# Patient Record
Sex: Male | Born: 1990 | Race: Black or African American | Hispanic: No | Marital: Single | Smoking: Former smoker
Health system: Southern US, Community
[De-identification: ages and names within clinical notes are randomized; demographics above are authoritative.]

## PROBLEM LIST (undated history)

## (undated) ENCOUNTER — Telehealth

## (undated) ENCOUNTER — Ambulatory Visit

## (undated) ENCOUNTER — Encounter

## (undated) ENCOUNTER — Ambulatory Visit: Payer: MEDICAID

## (undated) ENCOUNTER — Ambulatory Visit: Payer: MEDICAID | Attending: Internal Medicine | Primary: Internal Medicine

## (undated) ENCOUNTER — Inpatient Hospital Stay

## (undated) ENCOUNTER — Ambulatory Visit: Payer: MEDICAID | Attending: Surgery | Primary: Surgery

## (undated) ENCOUNTER — Encounter: Attending: Surgery | Primary: Surgery

## (undated) ENCOUNTER — Encounter: Attending: Internal Medicine | Primary: Internal Medicine

## (undated) ENCOUNTER — Ambulatory Visit
Attending: Student in an Organized Health Care Education/Training Program | Primary: Student in an Organized Health Care Education/Training Program

## (undated) ENCOUNTER — Telehealth: Attending: Gastroenterology | Primary: Gastroenterology

## (undated) ENCOUNTER — Telehealth: Attending: Internal Medicine | Primary: Internal Medicine

## (undated) ENCOUNTER — Telehealth
Attending: Student in an Organized Health Care Education/Training Program | Primary: Student in an Organized Health Care Education/Training Program

## (undated) DIAGNOSIS — K754 Autoimmune hepatitis: Secondary | ICD-10-CM

## (undated) DIAGNOSIS — R112 Nausea with vomiting, unspecified: Secondary | ICD-10-CM

## (undated) DIAGNOSIS — F909 Attention-deficit hyperactivity disorder, unspecified type: Secondary | ICD-10-CM

## (undated) DIAGNOSIS — Z9889 Other specified postprocedural states: Secondary | ICD-10-CM

## (undated) DIAGNOSIS — H9190 Unspecified hearing loss, unspecified ear: Secondary | ICD-10-CM

## (undated) DIAGNOSIS — F32A Depression, unspecified: Secondary | ICD-10-CM

## (undated) DIAGNOSIS — A0472 Enterocolitis due to Clostridium difficile, not specified as recurrent: Secondary | ICD-10-CM

## (undated) DIAGNOSIS — F319 Bipolar disorder, unspecified: Secondary | ICD-10-CM

## (undated) DIAGNOSIS — I10 Essential (primary) hypertension: Secondary | ICD-10-CM

## (undated) DIAGNOSIS — F329 Major depressive disorder, single episode, unspecified: Secondary | ICD-10-CM

## (undated) DIAGNOSIS — J189 Pneumonia, unspecified organism: Secondary | ICD-10-CM

## (undated) DIAGNOSIS — R569 Unspecified convulsions: Secondary | ICD-10-CM

## (undated) DIAGNOSIS — F419 Anxiety disorder, unspecified: Secondary | ICD-10-CM

## (undated) DIAGNOSIS — R7303 Prediabetes: Secondary | ICD-10-CM

## (undated) DIAGNOSIS — R519 Headache, unspecified: Secondary | ICD-10-CM

## (undated) DIAGNOSIS — F819 Developmental disorder of scholastic skills, unspecified: Secondary | ICD-10-CM

## (undated) DIAGNOSIS — D649 Anemia, unspecified: Secondary | ICD-10-CM

## (undated) DIAGNOSIS — K5190 Ulcerative colitis, unspecified, without complications: Secondary | ICD-10-CM

## (undated) DIAGNOSIS — K219 Gastro-esophageal reflux disease without esophagitis: Secondary | ICD-10-CM

## (undated) DIAGNOSIS — Z8489 Family history of other specified conditions: Secondary | ICD-10-CM

## (undated) DIAGNOSIS — T8859XA Other complications of anesthesia, initial encounter: Secondary | ICD-10-CM

## (undated) HISTORY — PX: TONSILLECTOMY: SUR1361

## (undated) HISTORY — DX: Enterocolitis due to Clostridium difficile, not specified as recurrent: A04.72

## (undated) HISTORY — DX: Ulcerative colitis, unspecified, without complications: K51.90

## (undated) HISTORY — PX: ADENOIDECTOMY: SUR15

## (undated) HISTORY — DX: Autoimmune hepatitis: K75.4

## (undated) HISTORY — DX: Developmental disorder of scholastic skills, unspecified: F81.9

## (undated) HISTORY — PX: COLOPROCTECTOMY W/ ILEO J POUCH: SUR277

## (undated) HISTORY — PX: COLON SURGERY: SHX602

---
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
## 2012-01-21 ENCOUNTER — Emergency Department (HOSPITAL_COMMUNITY)
Admission: EM | Admit: 2012-01-21 | Discharge: 2012-01-21 | Disposition: A | Discharge status: Home / Self Care | Payer: Medicaid Other | Attending: Emergency Medicine | Admitting: Emergency Medicine

## 2012-01-21 ENCOUNTER — Encounter (HOSPITAL_COMMUNITY): Payer: Self-pay | Admitting: *Deleted

## 2012-01-21 DIAGNOSIS — Z881 Allergy status to other antibiotic agents status: Secondary | ICD-10-CM | POA: Insufficient documentation

## 2012-01-21 DIAGNOSIS — K5289 Other specified noninfective gastroenteritis and colitis: Secondary | ICD-10-CM | POA: Insufficient documentation

## 2012-01-21 DIAGNOSIS — K920 Hematemesis: Secondary | ICD-10-CM | POA: Insufficient documentation

## 2012-01-21 DIAGNOSIS — K529 Noninfective gastroenteritis and colitis, unspecified: Secondary | ICD-10-CM

## 2012-01-21 DIAGNOSIS — K219 Gastro-esophageal reflux disease without esophagitis: Secondary | ICD-10-CM | POA: Insufficient documentation

## 2012-01-21 DIAGNOSIS — J45909 Unspecified asthma, uncomplicated: Secondary | ICD-10-CM | POA: Insufficient documentation

## 2012-01-21 DIAGNOSIS — Z825 Family history of asthma and other chronic lower respiratory diseases: Secondary | ICD-10-CM | POA: Insufficient documentation

## 2012-01-21 DIAGNOSIS — F319 Bipolar disorder, unspecified: Secondary | ICD-10-CM | POA: Insufficient documentation

## 2012-01-21 DIAGNOSIS — Z91018 Allergy to other foods: Secondary | ICD-10-CM | POA: Insufficient documentation

## 2012-01-21 DIAGNOSIS — F909 Attention-deficit hyperactivity disorder, unspecified type: Secondary | ICD-10-CM | POA: Insufficient documentation

## 2012-01-21 DIAGNOSIS — F17200 Nicotine dependence, unspecified, uncomplicated: Secondary | ICD-10-CM | POA: Insufficient documentation

## 2012-01-21 LAB — COMPREHENSIVE METABOLIC PANEL
ALT: 12 U/L (ref 0–53)
AST: 30 U/L (ref 0–37)
Albumin: 4.5 g/dL (ref 3.5–5.2)
Alkaline Phosphatase: 72 U/L (ref 39–117)
BUN: 11 mg/dL (ref 6–23)
CO2: 28 mEq/L (ref 19–32)
Calcium: 10.2 mg/dL (ref 8.4–10.5)
Chloride: 100 mEq/L (ref 96–112)
Creatinine, Ser: 1.05 mg/dL (ref 0.50–1.35)
GFR calc Af Amer: >90 mL/min (ref >90)
GFR calc non Af Amer: >90 mL/min (ref >90)
Glucose, Bld: 93 mg/dL (ref 70–99)
Potassium: 4 mEq/L (ref 3.5–5.1)
Sodium: 137 mEq/L (ref 135–145)
Total Bilirubin: 0.6 mg/dL (ref 0.3–1.2)
Total Protein: 8.2 g/dL (ref 6.0–8.3)

## 2012-01-21 LAB — CBC WITH DIFFERENTIAL/PLATELET
Basophils Absolute: 0 10*3/uL (ref 0.0–0.1)
Basophils Relative: 0 % (ref 0–1)
Eosinophils Absolute: 0.1 10*3/uL (ref 0.0–0.7)
Eosinophils Relative: 1 % (ref 0–5)
HCT: 43.7 % (ref 39.0–52.0)
Hemoglobin: 15.5 g/dL (ref 13.0–17.0)
Lymphocytes Relative: 25 % (ref 12–46)
Lymphs Abs: 2 10*3/uL (ref 0.7–4.0)
MCH: 28.4 pg (ref 26.0–34.0)
MCHC: 35.5 g/dL (ref 30.0–36.0)
MCV: 80.2 fL (ref 78.0–100.0)
Monocytes Absolute: 0.5 10*3/uL (ref 0.1–1.0)
Monocytes Relative: 6 % (ref 3–12)
Neutro Abs: 5.3 10*3/uL (ref 1.7–7.7)
Neutrophils Relative %: 68 % (ref 43–77)
Platelets: 252 10*3/uL (ref 150–400)
RBC: 5.45 MIL/uL (ref 4.22–5.81)
RDW: 12.9 % (ref 11.5–15.5)
WBC: 7.8 10*3/uL (ref 4.0–10.5)

## 2012-01-21 MED ORDER — KETOROLAC TROMETHAMINE 30 MG/ML IJ SOLN
30.0000 mg | Freq: Once | INTRAMUSCULAR | Status: AC
  Administered 2012-01-21: 30 mg via INTRAVENOUS
  Filled 2012-01-21: qty 1

## 2012-01-21 MED ORDER — PROMETHAZINE HCL 25 MG PO TABS: 25.0000 mg | ORAL_TABLET | Freq: Four times a day (QID) | ORAL | Status: DC | PRN

## 2012-01-21 MED ORDER — SODIUM CHLORIDE 0.9 % IV SOLN
Freq: Once | INTRAVENOUS | Status: AC
  Administered 2012-01-21: 20:00:00 via INTRAVENOUS

## 2012-01-21 MED ORDER — ONDANSETRON HCL 4 MG/2ML IJ SOLN
4.0000 mg | Freq: Once | INTRAMUSCULAR | Status: AC
  Administered 2012-01-21: 4 mg via INTRAVENOUS
  Filled 2012-01-21: qty 2

## 2012-01-21 NOTE — ED Provider Notes (Addendum)
History     CSN: [id number]  Arrival date & time 01/21/12  1659   First MD Initiated Contact with Patient 01/21/12 1925      Chief Complaint  Patient presents with  . Hematemesis    (Consider location/radiation/quality/duration/timing/severity/associated sxs/prior treatment) HPI Comments: No ill contacts or suspicious foods.  Patient is a 21 y.o. male presenting with vomiting. The history is provided by the patient.  Emesis  This is a new problem. Episode onset: 2-3 days ago. The problem occurs continuously. The problem has not changed since onset.The emesis has an appearance of stomach contents and bright red blood. There has been no fever. Pertinent negatives include no abdominal pain, no chills, no diarrhea and no fever.    Past Medical History  Diagnosis Date  . Asthma   . ADHD (attention deficit hyperactivity disorder)   . Bipolar 1 disorder   . GERD (gastroesophageal reflux disease)   . Depression     Past Surgical History  Procedure Date  . Tonsillectomy   . Adenoidectomy     Family History  Problem Relation Age of Onset  . Asthma Mother     History  Substance Use Topics  . Smoking status: Current Everyday Smoker    Types: Cigarettes  . Smokeless tobacco: Not on file  . Alcohol Use: No      Review of Systems  Constitutional: Negative for fever and chills.  Gastrointestinal: Positive for vomiting. Negative for abdominal pain and diarrhea.  All other systems reviewed and are negative.    Allergies  Amoxicillin-pot clavulanate; Pineapple; and Tomato  Home Medications   Current Outpatient Rx  Name Route Sig Dispense Refill  . ALBUTEROL SULFATE HFA 108 (90 BASE) MCG/ACT IN AERS Inhalation Inhale 2 puffs into the lungs every 6 (six) hours as needed. For shortness of breath     . ALBUTEROL SULFATE (2.5 MG/3ML) 0.083% IN NEBU Nebulization Take 2.5 mg by nebulization every 6 (six) hours as needed. For shortness of breath     . ATOMOXETINE HCL 100 MG  PO CAPS Oral Take 100 mg by mouth every morning.     Marland Kitchen DIPHENHYDRAMINE HCL 50 MG PO CAPS Oral Take 50 mg by mouth as needed. For allergic reaction     . EPINEPHRINE 0.3 MG/0.3ML IJ DEVI Intramuscular Inject 0.3 mg into the muscle once.      Marland Kitchen GUANFACINE HCL ER 4 MG PO TB24 Oral Take 4 mg by mouth at bedtime.      Marland Kitchen LORATADINE 10 MG PO TABS Oral Take 10 mg by mouth at bedtime.      . METHYLPHENIDATE HCL ER 54 MG PO TBCR Oral Take 54 mg by mouth every morning.    Marland Kitchen MONTELUKAST SODIUM 10 MG PO TABS Oral Take 10 mg by mouth at bedtime.      Marland Kitchen OLANZAPINE 15 MG PO TABS Oral Take 15 mg by mouth at bedtime.      . OMEPRAZOLE 40 MG PO CPDR Oral Take 40 mg by mouth every morning.       BP 130/74  Pulse 89  Temp 98.5 F (36.9 C) (Oral)  Resp 20  Ht 5' 9"  (1.753 m)  Wt 184 lb (83.462 kg)  BMI 27.17 kg/m2  SpO2 100%  Physical Exam  Nursing note and vitals reviewed. Constitutional: He is oriented to person, place, and time. He appears well-developed and well-nourished. No distress.  HENT:  Head: Normocephalic and atraumatic.  Neck: Normal range of motion. Neck supple.  Cardiovascular:  Normal rate and regular rhythm.   No murmur heard. Pulmonary/Chest: Effort normal and breath sounds normal. No respiratory distress. He has no wheezes.  Abdominal: Soft. He exhibits no distension. There is no tenderness.  Musculoskeletal: Normal range of motion.  Neurological: He is alert and oriented to person, place, and time.  Skin: Skin is warm and dry. He is not diaphoretic.    ED Course  Procedures (including critical care time)   Labs Reviewed  CBC WITH DIFFERENTIAL  COMPREHENSIVE METABOLIC PANEL   No results found.   No diagnosis found.    MDM  The patient presents with vomiting for the past two days, the most recent episode was streaked with blood.  Today's workup reveal a normal Hb, WBC and electrolytes.  He was hydrated, given antiemetics, and has not vomited since he arrived here.  I  suspect he has enteritis with Mallory-Weiss tear.  Does not appear toxic, doubt Boerhaave's.  Will discharge to home with antiemetics, follow up prn if he worsens or symptoms persist.  He is already on prilosec.  Will recommend he take this twice daily until he improves.          Veryl Speak, MD 01/21/12 2054  Veryl Speak, MD 03/04/12 0700

## 2012-01-21 NOTE — ED Notes (Signed)
Vomiting for last few days, now vomited blood x1.  Alert, NAD. No abd pain

## 2012-05-08 ENCOUNTER — Emergency Department (HOSPITAL_COMMUNITY): Payer: Medicaid Other

## 2012-05-08 ENCOUNTER — Emergency Department (HOSPITAL_COMMUNITY)
Admission: EM | Admit: 2012-05-08 | Discharge: 2012-05-08 | Disposition: A | Discharge status: Home / Self Care | Payer: Medicaid Other | Attending: Emergency Medicine | Admitting: Emergency Medicine

## 2012-05-08 ENCOUNTER — Encounter (HOSPITAL_COMMUNITY): Payer: Self-pay

## 2012-05-08 DIAGNOSIS — Z79899 Other long term (current) drug therapy: Secondary | ICD-10-CM | POA: Insufficient documentation

## 2012-05-08 DIAGNOSIS — F3289 Other specified depressive episodes: Secondary | ICD-10-CM | POA: Insufficient documentation

## 2012-05-08 DIAGNOSIS — F329 Major depressive disorder, single episode, unspecified: Secondary | ICD-10-CM | POA: Insufficient documentation

## 2012-05-08 DIAGNOSIS — K219 Gastro-esophageal reflux disease without esophagitis: Secondary | ICD-10-CM | POA: Insufficient documentation

## 2012-05-08 DIAGNOSIS — F909 Attention-deficit hyperactivity disorder, unspecified type: Secondary | ICD-10-CM | POA: Insufficient documentation

## 2012-05-08 DIAGNOSIS — Y9389 Activity, other specified: Secondary | ICD-10-CM | POA: Insufficient documentation

## 2012-05-08 DIAGNOSIS — X58XXXA Exposure to other specified factors, initial encounter: Secondary | ICD-10-CM | POA: Insufficient documentation

## 2012-05-08 DIAGNOSIS — J45909 Unspecified asthma, uncomplicated: Secondary | ICD-10-CM | POA: Insufficient documentation

## 2012-05-08 DIAGNOSIS — F319 Bipolar disorder, unspecified: Secondary | ICD-10-CM | POA: Insufficient documentation

## 2012-05-08 DIAGNOSIS — S40012A Contusion of left shoulder, initial encounter: Secondary | ICD-10-CM

## 2012-05-08 DIAGNOSIS — Y9289 Other specified places as the place of occurrence of the external cause: Secondary | ICD-10-CM | POA: Insufficient documentation

## 2012-05-08 DIAGNOSIS — F17200 Nicotine dependence, unspecified, uncomplicated: Secondary | ICD-10-CM | POA: Insufficient documentation

## 2012-05-08 DIAGNOSIS — S40019A Contusion of unspecified shoulder, initial encounter: Secondary | ICD-10-CM | POA: Insufficient documentation

## 2012-05-08 DIAGNOSIS — IMO0001 Reserved for inherently not codable concepts without codable children: Secondary | ICD-10-CM | POA: Insufficient documentation

## 2012-05-08 MED ORDER — IBUPROFEN 800 MG PO TABS
800.0000 mg | ORAL_TABLET | Freq: Once | ORAL | Status: AC
  Administered 2012-05-08: 800 mg via ORAL
  Filled 2012-05-08: qty 1

## 2012-05-08 MED ORDER — IBUPROFEN 800 MG PO TABS: 800.0000 mg | ORAL_TABLET | Freq: Three times a day (TID) | ORAL | Status: DC

## 2012-05-08 NOTE — ED Notes (Signed)
Pt presents with left shoulder pain secondary to attempting to force a lock door open per pt's family. No deformity noted at this time. Pulses equal bilateral extremities with brisk cap refill. Pt denies other injuries.

## 2012-05-08 NOTE — ED Provider Notes (Signed)
History     CSN: [id number]  Arrival date & time 05/08/12  1651   First MD Initiated Contact with Patient 05/08/12 1927      Chief Complaint  Patient presents with  . Shoulder Pain    (Consider location/radiation/quality/duration/timing/severity/associated sxs/prior treatment) Patient is a 21 y.o. male presenting with shoulder pain. The history is provided by the patient.  Shoulder Pain This is a new problem. The current episode started yesterday. The problem occurs constantly. The problem has been unchanged. Associated symptoms include myalgias. Pertinent negatives include no abdominal pain, arthralgias, chest pain, coughing, fever, headaches or neck pain. Exacerbated by: raising the left arm. He has tried acetaminophen for the symptoms. The treatment provided no relief.    Past Medical History  Diagnosis Date  . Asthma   . ADHD (attention deficit hyperactivity disorder)   . Bipolar 1 disorder   . GERD (gastroesophageal reflux disease)   . Depression     Past Surgical History  Procedure Date  . Tonsillectomy   . Adenoidectomy     Family History  Problem Relation Age of Onset  . Asthma Mother     History  Substance Use Topics  . Smoking status: Current Every Day Smoker    Types: Cigarettes  . Smokeless tobacco: Not on file  . Alcohol Use: No      Review of Systems  Constitutional: Negative for fever and activity change.       All ROS Neg except as noted in HPI  HENT: Negative for nosebleeds and neck pain.   Eyes: Negative for photophobia and discharge.  Respiratory: Negative for cough, shortness of breath and wheezing.   Cardiovascular: Negative for chest pain and palpitations.  Gastrointestinal: Negative for abdominal pain and blood in stool.  Genitourinary: Negative for dysuria, frequency and hematuria.  Musculoskeletal: Positive for myalgias. Negative for back pain and arthralgias.  Skin: Negative.   Neurological: Negative for dizziness, seizures,  speech difficulty and headaches.  Psychiatric/Behavioral: Negative for hallucinations and confusion.    Allergies  Amoxicillin-pot clavulanate; Pineapple; Strawberry; and Tomato  Home Medications   Current Outpatient Rx  Name  Route  Sig  Dispense  Refill  . ALBUTEROL SULFATE HFA 108 (90 BASE) MCG/ACT IN AERS   Inhalation   Inhale 2 puffs into the lungs every 6 (six) hours as needed. For shortness of breath          . ALBUTEROL SULFATE (2.5 MG/3ML) 0.083% IN NEBU   Nebulization   Take 2.5 mg by nebulization every 6 (six) hours as needed. For shortness of breath         . ATOMOXETINE HCL 100 MG PO CAPS   Oral   Take 100 mg by mouth every morning.          Marland Kitchen CLONIDINE HCL 0.1 MG PO TABS   Oral   Take 0.2 mg by mouth daily.         Marland Kitchen LORATADINE 10 MG PO TABS   Oral   Take 10 mg by mouth at bedtime.           . METHYLPHENIDATE HCL ER 54 MG PO TBCR   Oral   Take 54 mg by mouth every morning.         Marland Kitchen MONTELUKAST SODIUM 10 MG PO TABS   Oral   Take 10 mg by mouth at bedtime.           . OMEPRAZOLE 40 MG PO CPDR   Oral   Take  40 mg by mouth every morning.          Marland Kitchen SERTRALINE HCL 25 MG PO TABS   Oral   Take 25 mg by mouth daily.         Marland Kitchen EPINEPHRINE 0.3 MG/0.3ML IJ DEVI   Intramuscular   Inject 0.3 mg into the muscle once.           Marland Kitchen PROMETHAZINE HCL 25 MG PO TABS   Oral   Take 1 tablet (25 mg total) by mouth every 6 (six) hours as needed for nausea.   10 tablet   0     BP 138/82  Pulse 74  Temp 98.6 F (37 C) (Oral)  Resp 20  Ht 5' 8"  (1.727 m)  Wt 195 lb (88.451 kg)  BMI 29.65 kg/m2  SpO2 100%  Physical Exam  Nursing note and vitals reviewed. Constitutional: He is oriented to person, place, and time. He appears well-developed and well-nourished.  Non-toxic appearance.  HENT:  Head: Normocephalic.  Right Ear: Tympanic membrane and external ear normal.  Left Ear: Tympanic membrane and external ear normal.  Eyes: EOM and lids  are normal. Pupils are equal, round, and reactive to light.  Neck: Normal range of motion. Neck supple. Carotid bruit is not present.  Cardiovascular: Normal rate, regular rhythm, normal heart sounds, intact distal pulses and normal pulses.   Pulmonary/Chest: Breath sounds normal. No respiratory distress.  Abdominal: Soft. Bowel sounds are normal. There is no tenderness. There is no guarding.  Musculoskeletal: Normal range of motion.       There is full range of motion of the fingers, wrists, and elbow, of the left upper extremity. The brachial and radial pulses are 2+ and symmetrical. The capillary refill is less than 3 seconds. There is soreness and discomfort with attempted range of motion of the left shoulder. There is no deformity appreciated. There is no hot areas or hot joints involving the left shoulder. The clavicle is nontender, and there's no deformity. There's no deformity of the scapula on the left.  Lymphadenopathy:       Head (right side): No submandibular adenopathy present.       Head (left side): No submandibular adenopathy present.    He has no cervical adenopathy.  Neurological: He is alert and oriented to person, place, and time. He has normal strength. No cranial nerve deficit or sensory deficit.  Skin: Skin is warm and dry.  Psychiatric: He has a normal mood and affect. His speech is normal.    ED Course  Procedures (including critical care time)  Labs Reviewed - No data to display Dg Shoulder Left  05/08/2012  *RADIOLOGY REPORT*  Clinical Data: Left shoulder pain after injury.  LEFT SHOULDER - 2+ VIEW  Comparison:  None.  Findings:  There is no evidence of fracture or dislocation.  There is no evidence of arthropathy or other focal bone abnormality. Soft tissues are unremarkable.  IMPRESSION: Negative.   Original Report Authenticated By: Aletta Edouard, M.D.      No diagnosis found.    MDM  I have reviewed nursing notes, vital signs, and all appropriate lab and  imaging results for this patient. X-ray of the left shoulder is negative for fracture or dislocation. The patient is fitted with a sling to be used over the next 5-7 days. Prescription for ibuprofen 800 mg 3 times daily with food also given to the patient. The patient is to see the orthopedist for additional evaluation if not  improving.       Lenox Ahr, [place] 05/08/12 [phone]

## 2012-05-08 NOTE — ED Notes (Signed)
Pt reports ramming door of home w/ left shoulder. Yesterday. Area painful today

## 2012-05-08 NOTE — ED Provider Notes (Signed)
Medical screening examination/treatment/procedure(s) were performed by non-physician practitioner and as supervising physician I was immediately available for consultation/collaboration.  Rhunette Croft, M.D.     Rhunette Croft, MD 05/08/12 2103

## 2012-08-11 ENCOUNTER — Encounter (HOSPITAL_COMMUNITY): Payer: Self-pay

## 2012-08-11 ENCOUNTER — Emergency Department (HOSPITAL_COMMUNITY)
Admission: EM | Admit: 2012-08-11 | Discharge: 2012-08-11 | Disposition: A | Discharge status: Home / Self Care | Payer: Medicaid Other | Attending: Emergency Medicine | Admitting: Emergency Medicine

## 2012-08-11 ENCOUNTER — Emergency Department (HOSPITAL_COMMUNITY): Payer: Medicaid Other

## 2012-08-11 DIAGNOSIS — J45909 Unspecified asthma, uncomplicated: Secondary | ICD-10-CM | POA: Insufficient documentation

## 2012-08-11 DIAGNOSIS — F909 Attention-deficit hyperactivity disorder, unspecified type: Secondary | ICD-10-CM | POA: Insufficient documentation

## 2012-08-11 DIAGNOSIS — F319 Bipolar disorder, unspecified: Secondary | ICD-10-CM | POA: Insufficient documentation

## 2012-08-11 DIAGNOSIS — Z79899 Other long term (current) drug therapy: Secondary | ICD-10-CM | POA: Insufficient documentation

## 2012-08-11 DIAGNOSIS — R109 Unspecified abdominal pain: Secondary | ICD-10-CM

## 2012-08-11 DIAGNOSIS — Z046 Encounter for general psychiatric examination, requested by authority: Secondary | ICD-10-CM | POA: Insufficient documentation

## 2012-08-11 DIAGNOSIS — F419 Anxiety disorder, unspecified: Secondary | ICD-10-CM

## 2012-08-11 DIAGNOSIS — R1084 Generalized abdominal pain: Secondary | ICD-10-CM | POA: Insufficient documentation

## 2012-08-11 DIAGNOSIS — K219 Gastro-esophageal reflux disease without esophagitis: Secondary | ICD-10-CM | POA: Insufficient documentation

## 2012-08-11 DIAGNOSIS — F411 Generalized anxiety disorder: Secondary | ICD-10-CM | POA: Insufficient documentation

## 2012-08-11 DIAGNOSIS — R112 Nausea with vomiting, unspecified: Secondary | ICD-10-CM | POA: Insufficient documentation

## 2012-08-11 LAB — COMPREHENSIVE METABOLIC PANEL
ALT: 9 U/L (ref 0–53)
AST: 25 U/L (ref 0–37)
Albumin: 3.9 g/dL (ref 3.5–5.2)
Alkaline Phosphatase: 62 U/L (ref 39–117)
BUN: 9 mg/dL (ref 6–23)
CO2: 26 mEq/L (ref 19–32)
Calcium: 9.8 mg/dL (ref 8.4–10.5)
Chloride: 104 mEq/L (ref 96–112)
Creatinine, Ser: 0.96 mg/dL (ref 0.50–1.35)
GFR calc Af Amer: >90 mL/min (ref >90)
GFR calc non Af Amer: >90 mL/min (ref >90)
Glucose, Bld: 103 mg/dL — ABNORMAL HIGH (ref 70–99)
Potassium: 3.8 mEq/L (ref 3.5–5.1)
Sodium: 140 mEq/L (ref 135–145)
Total Bilirubin: 0.3 mg/dL (ref 0.3–1.2)
Total Protein: 6.9 g/dL (ref 6.0–8.3)

## 2012-08-11 LAB — CBC WITH DIFFERENTIAL/PLATELET
Basophils Absolute: 0 10*3/uL (ref 0.0–0.1)
Basophils Relative: 0 % (ref 0–1)
Eosinophils Absolute: 0.1 10*3/uL (ref 0.0–0.7)
Eosinophils Relative: 1 % (ref 0–5)
HCT: 40.4 % (ref 39.0–52.0)
Hemoglobin: 14.3 g/dL (ref 13.0–17.0)
Lymphocytes Relative: 24 % (ref 12–46)
Lymphs Abs: 1.5 10*3/uL (ref 0.7–4.0)
MCH: 28.5 pg (ref 26.0–34.0)
MCHC: 35.4 g/dL (ref 30.0–36.0)
MCV: 80.6 fL (ref 78.0–100.0)
Monocytes Absolute: 0.4 10*3/uL (ref 0.1–1.0)
Monocytes Relative: 6 % (ref 3–12)
Neutro Abs: 4.2 10*3/uL (ref 1.7–7.7)
Neutrophils Relative %: 69 % (ref 43–77)
Platelets: 228 10*3/uL (ref 150–400)
RBC: 5.01 MIL/uL (ref 4.22–5.81)
RDW: 13.1 % (ref 11.5–15.5)
WBC: 6.1 10*3/uL (ref 4.0–10.5)

## 2012-08-11 LAB — LIPASE, BLOOD: Lipase: 23 U/L (ref 11–59)

## 2012-08-11 MED ORDER — PANTOPRAZOLE SODIUM 40 MG IV SOLR
40.0000 mg | Freq: Once | INTRAVENOUS | Status: AC
  Administered 2012-08-11: 40 mg via INTRAVENOUS
  Filled 2012-08-11: qty 40

## 2012-08-11 MED ORDER — ONDANSETRON HCL 4 MG/2ML IJ SOLN
4.0000 mg | Freq: Once | INTRAMUSCULAR | Status: AC
Start: 2012-08-11 — End: 2012-08-11
  Administered 2012-08-11: 4 mg via INTRAVENOUS
  Filled 2012-08-11: qty 2

## 2012-08-11 MED ORDER — SODIUM CHLORIDE 0.9 % IV BOLUS (SEPSIS)
1000.0000 mL | Freq: Once | INTRAVENOUS | Status: AC
  Administered 2012-08-11: 1000 mL via INTRAVENOUS

## 2012-08-11 MED ORDER — TRAMADOL HCL 50 MG PO TABS: 50.0000 mg | ORAL_TABLET | Freq: Four times a day (QID) | ORAL | Status: DC | PRN
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
## 2012-08-11 NOTE — ED Notes (Signed)
Patient with c/o abdominal pain. Holding stomach and rocking back and forth. Patient denies SI/HI. H/o bipolar.
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
## 2012-08-11 NOTE — ED Notes (Signed)
Patient with no complaints at this time. Respirations even and unlabored. Skin warm/dry. Discharge instructions reviewed with patient at this time. Patient given opportunity to voice concerns/ask questions. IV removed per policy and band-aid applied to site. Patient discharged at this time and left Emergency Department with steady gait.  

## 2012-09-09 ENCOUNTER — Emergency Department (HOSPITAL_COMMUNITY)
Admission: EM | Admit: 2012-09-09 | Discharge: 2012-09-09 | Disposition: A | Discharge status: Home / Self Care | Payer: Medicaid Other | Attending: Emergency Medicine | Admitting: Emergency Medicine

## 2012-09-09 ENCOUNTER — Encounter (HOSPITAL_COMMUNITY): Payer: Self-pay | Admitting: *Deleted

## 2012-09-09 DIAGNOSIS — M79609 Pain in unspecified limb: Secondary | ICD-10-CM | POA: Insufficient documentation

## 2012-09-09 DIAGNOSIS — J45909 Unspecified asthma, uncomplicated: Secondary | ICD-10-CM | POA: Insufficient documentation

## 2012-09-09 DIAGNOSIS — F319 Bipolar disorder, unspecified: Secondary | ICD-10-CM | POA: Insufficient documentation

## 2012-09-09 DIAGNOSIS — K219 Gastro-esophageal reflux disease without esophagitis: Secondary | ICD-10-CM | POA: Insufficient documentation

## 2012-09-09 DIAGNOSIS — Z79899 Other long term (current) drug therapy: Secondary | ICD-10-CM | POA: Insufficient documentation

## 2012-09-09 DIAGNOSIS — F909 Attention-deficit hyperactivity disorder, unspecified type: Secondary | ICD-10-CM | POA: Insufficient documentation

## 2012-09-09 MED ORDER — ENOXAPARIN SODIUM 100 MG/ML ~~LOC~~ SOLN
1.0000 mg/kg | Freq: Once | SUBCUTANEOUS | Status: AC
  Administered 2012-09-09: 80 mg via SUBCUTANEOUS
  Filled 2012-09-09: qty 1

## 2012-09-09 MED ORDER — HYDROCODONE-ACETAMINOPHEN 5-325 MG PO TABS: ORAL_TABLET | ORAL | Status: DC

## 2012-09-09 NOTE — ED Notes (Signed)
Pain rt calf for 3 days, no known injury.

## 2012-09-10 ENCOUNTER — Ambulatory Visit (HOSPITAL_COMMUNITY)
Admit: 2012-09-10 | Discharge: 2012-09-10 | Disposition: A | Discharge status: Home / Self Care | Payer: Medicaid Other | Attending: Pediatrics | Admitting: Pediatrics

## 2012-09-11 NOTE — ED Provider Notes (Signed)
History     CSN: [id number]  Arrival date & time 09/09/12  1637   First MD Initiated Contact with Patient 09/09/12 1713      Chief Complaint  Patient presents with  . Leg Pain    (Consider location/radiation/quality/duration/timing/severity/associated sxs/prior treatment) HPI Comments: Patient c/o pain to his posterior right calf for 3 days.  He denies known injury, states the pain is worse with weight bearing and improves with rest.  Pain is also reproduced with dorsiflexion of the right foot.  He denies recent illness, joint pain or hx of previous DVT or recent extended travel.    Patient is a 22 y.o. male presenting with leg pain. The history is provided by the patient and a parent.  Leg Pain Location:  Leg Time since incident:  3 days Leg location:  R lower leg Pain details:    Quality:  Aching   Radiates to:  Does not radiate   Severity:  Moderate   Onset quality:  Gradual   Timing:  Constant   Progression:  Unchanged Chronicity:  New Dislocation: no   Foreign body present:  No foreign bodies Prior injury to area:  No Relieved by:  Rest Worsened by:  Bearing weight Ineffective treatments:  None tried Associated symptoms: no back pain, no decreased ROM, no fatigue, no fever, no muscle weakness, no neck pain, no numbness, no stiffness, no swelling and no tingling   Risk factors: no obesity     Past Medical History  Diagnosis Date  . Asthma   . ADHD (attention deficit hyperactivity disorder)   . Bipolar 1 disorder   . GERD (gastroesophageal reflux disease)   . Depression     Past Surgical History  Procedure Laterality Date  . Tonsillectomy    . Adenoidectomy      Family History  Problem Relation Age of Onset  . Asthma Mother     History  Substance Use Topics  . Smoking status: Never Smoker   . Smokeless tobacco: Never Used  . Alcohol Use: No      Review of Systems  Constitutional: Negative for fever, chills and fatigue.  HENT: Negative for sore  throat, trouble swallowing, neck pain and neck stiffness.   Respiratory: Negative for cough, shortness of breath and wheezing.   Cardiovascular: Negative for chest pain and palpitations.  Gastrointestinal: Negative for nausea, vomiting, abdominal pain and blood in stool.  Genitourinary: Negative for dysuria, hematuria and flank pain.  Musculoskeletal: Positive for myalgias. Negative for back pain, arthralgias and stiffness.  Skin: Negative for color change and rash.  Neurological: Negative for dizziness, weakness and numbness.  Hematological: Does not bruise/bleed easily.  All other systems reviewed and are negative.    Allergies  Amoxicillin-pot clavulanate; Pineapple; Strawberry; and Tomato  Home Medications   Current Outpatient Rx  Name  Route  Sig  Dispense  Refill  . atomoxetine (STRATTERA) 100 MG capsule   Oral   Take 100 mg by mouth every morning.          . cloNIDine (CATAPRES) 0.2 MG tablet   Oral   Take 0.2 mg by mouth at bedtime.         Marland Kitchen loratadine (CLARITIN) 10 MG tablet   Oral   Take 10 mg by mouth at bedtime.           . methylphenidate (CONCERTA) 54 MG CR tablet   Oral   Take 54 mg by mouth every morning.         Marland Kitchen  montelukast (SINGULAIR) 10 MG tablet   Oral   Take 10 mg by mouth at bedtime.           Marland Kitchen omeprazole (PRILOSEC) 40 MG capsule   Oral   Take 40 mg by mouth every morning.          . sertraline (ZOLOFT) 25 MG tablet   Oral   Take 50 mg by mouth daily.          Marland Kitchen albuterol (PROVENTIL HFA;VENTOLIN HFA) 108 (90 BASE) MCG/ACT inhaler   Inhalation   Inhale 2 puffs into the lungs every 6 (six) hours as needed. For shortness of breath          . albuterol (PROVENTIL) (2.5 MG/3ML) 0.083% nebulizer solution   Nebulization   Take 2.5 mg by nebulization every 6 (six) hours as needed. For shortness of breath         . EPINEPHrine (EPIPEN) 0.3 mg/0.3 mL DEVI   Intramuscular   Inject 0.3 mg into the muscle once.           Marland Kitchen  HYDROcodone-acetaminophen (NORCO/VICODIN) 5-325 MG per tablet      Take one tab po q 4-6 hrs prn pain   12 tablet   0     BP 122/74  Pulse 95  Temp(Src) 98.4 F (36.9 C) (Oral)  Resp 18  Ht 6' (1.829 m)  Wt 178 lb (80.74 kg)  BMI 24.14 kg/m2  SpO2 99%  Physical Exam  Nursing note and vitals reviewed. Constitutional: He is oriented to person, place, and time. He appears well-developed and well-nourished. No distress.  HENT:  Head: Normocephalic and atraumatic.  Mouth/Throat: Oropharynx is clear and moist.  Cardiovascular: Normal rate, regular rhythm, normal heart sounds and intact distal pulses.   No murmur heard. Pulmonary/Chest: Effort normal and breath sounds normal. No respiratory distress. He exhibits no tenderness.  Musculoskeletal: He exhibits tenderness. He exhibits no edema.       Right lower leg: He exhibits tenderness. He exhibits no bony tenderness, no swelling, no edema, no deformity and no laceration.       Legs: Localized ttp of the posterior right lower leg.  No edema, erythema or muscle deformity.  DP pulses are brisk and symmetrical.  Distal sensation intact.  Thompson test is negative  Neurological: He is alert and oriented to person, place, and time. He exhibits normal muscle tone. Coordination normal.  Skin: Skin is warm and dry. No erythema.    ED Course  Procedures (including critical care time)  Labs Reviewed - No data to display US Venous Img Lower Unilateral Right  09/10/2012  *RADIOLOGY REPORT*  Clinical Data: Pain in calf.  RIGHT LOWER EXTREMITY VENOUS DOPPLER ULTRASOUND  Technique: Gray-scale sonography with compression, as well as color and duplex ultrasound, were performed to evaluate the deep venous system from the level of the common femoral vein through the popliteal and proximal calf veins.  Comparison: None  Findings:  Normal compressibility of  the common femoral, superficial femoral, and popliteal veins, as well as the proximal calf veins.   No filling defects to suggest DVT on grayscale or color Doppler imaging.  Doppler waveforms show normal direction of venous flow, normal respiratory phasicity and response to augmentation.  IMPRESSION: No evidence of  lower extremity deep vein thrombosis.   Original Report Authenticated By: D. Wallace Going, MD      1. Lower leg pain, right       MDM    Pt has  ttp of the posterior right LE.  No edema or erythema.  No known injury.  I have scheduled pt to return here tomorrow 09/10/12 for venous doppler of the right LE .  appt at 3:00 pm.  And will given Lovenox SQ here in the dept.     Crutches given , pt agrees to elevate his leg and minimize weight bearing.  Given referral for his PMD and advised to f/u with orthopedics if [place] study is negative.    The patient appears reasonably screened and/or stabilized for discharge and I doubt any other medical condition or other [facility] requiring further screening, evaluation, or treatment in the ED at this time prior to discharge.         Adelfa Lozito L. Vanessa Kenmare, PA-C 09/11/12 2015

## 2012-09-20 NOTE — ED Provider Notes (Signed)
Medical screening examination/treatment/procedure(s) were performed by non-physician practitioner and as supervising physician I was immediately available for consultation/collaboration.  Babette Relic, MD 09/20/12 [phone]

## 2012-10-13 ENCOUNTER — Emergency Department (HOSPITAL_COMMUNITY): Payer: Medicaid Other

## 2012-10-13 ENCOUNTER — Encounter (HOSPITAL_COMMUNITY): Payer: Self-pay

## 2012-10-13 ENCOUNTER — Emergency Department (HOSPITAL_COMMUNITY)
Admission: EM | Admit: 2012-10-13 | Discharge: 2012-10-13 | Disposition: A | Discharge status: Home / Self Care | Payer: Medicaid Other | Attending: Emergency Medicine | Admitting: Emergency Medicine

## 2012-10-13 DIAGNOSIS — R1030 Lower abdominal pain, unspecified: Secondary | ICD-10-CM

## 2012-10-13 DIAGNOSIS — R109 Unspecified abdominal pain: Secondary | ICD-10-CM | POA: Insufficient documentation

## 2012-10-13 DIAGNOSIS — J45909 Unspecified asthma, uncomplicated: Secondary | ICD-10-CM | POA: Insufficient documentation

## 2012-10-13 DIAGNOSIS — Z79899 Other long term (current) drug therapy: Secondary | ICD-10-CM | POA: Insufficient documentation

## 2012-10-13 DIAGNOSIS — F909 Attention-deficit hyperactivity disorder, unspecified type: Secondary | ICD-10-CM | POA: Insufficient documentation

## 2012-10-13 DIAGNOSIS — F319 Bipolar disorder, unspecified: Secondary | ICD-10-CM | POA: Insufficient documentation

## 2012-10-13 DIAGNOSIS — K219 Gastro-esophageal reflux disease without esophagitis: Secondary | ICD-10-CM | POA: Insufficient documentation

## 2012-10-13 NOTE — ED Provider Notes (Signed)
History  This chart was scribed for [name], MD by [name], ED Scribe. This patient was seen in room APA09/APA09 and the patient's care was started at 6:49 PM.  CSN: [id number]  Arrival date & time 10/13/12  1714   First MD Initiated Contact with Patient 10/13/12 [place]      Chief Complaint  Patient presents with  . Testicle Pain     Patient is a 22 y.o. male presenting with testicular pain. The history is provided by the patient. No language interpreter was used.  Testicle Pain This is a new problem. The current episode started less than 1 hour ago. The problem occurs constantly. The problem has not changed since onset.Pertinent negatives include no chest pain, no abdominal pain and no headaches. The symptoms are aggravated by walking. The symptoms are relieved by rest. He has tried nothing for the symptoms.   HPI Comments: Wayne Avila is a 22 y.o. male who presents to the Emergency Department complaining of sudden onset, non-changing, constant left tesicle pain that started while he was in the shower. Pt denies having prior episodes of similar symptoms. He denies any recent trauma or falls. He denies nausea, emesis and urinary symptoms as associated symptoms. He has a h/o asthma, GERD and bipolar disorder. Pt denies smoking and alcohol use.   PCP is Dr. Maretta Los  Past Medical History  Diagnosis Date  . Asthma   . ADHD (attention deficit hyperactivity disorder)   . Bipolar 1 disorder   . GERD (gastroesophageal reflux disease)   . Depression     Past Surgical History  Procedure Laterality Date  . Tonsillectomy    . Adenoidectomy      Family History  Problem Relation Age of Onset  . Asthma Mother     History  Substance Use Topics  . Smoking status: Never Smoker   . Smokeless tobacco: Never Used  . Alcohol Use: No      Review of Systems  Constitutional: Negative for appetite change and fatigue.  HENT: Negative for congestion, sinus pressure and ear  discharge.   Eyes: Negative for discharge.  Respiratory: Negative for cough.   Cardiovascular: Negative for chest pain.  Gastrointestinal: Negative for abdominal pain and diarrhea.  Genitourinary: Positive for testicular pain. Negative for frequency and hematuria.  Musculoskeletal: Negative for back pain.  Skin: Negative for rash.  Neurological: Negative for seizures and headaches.  Psychiatric/Behavioral: Negative for hallucinations.    Allergies  Amoxicillin-pot clavulanate; Pineapple; Strawberry; and Tomato  Home Medications   Current Outpatient Rx  Name  Route  Sig  Dispense  Refill  . albuterol (PROVENTIL HFA;VENTOLIN HFA) 108 (90 BASE) MCG/ACT inhaler   Inhalation   Inhale 2 puffs into the lungs every 6 (six) hours as needed. For shortness of breath          . albuterol (PROVENTIL) (2.5 MG/3ML) 0.083% nebulizer solution   Nebulization   Take 2.5 mg by nebulization every 6 (six) hours as needed. For shortness of breath         . atomoxetine (STRATTERA) 100 MG capsule   Oral   Take 100 mg by mouth every morning.          . cloNIDine (CATAPRES) 0.2 MG tablet   Oral   Take 0.2 mg by mouth at bedtime.         Marland Kitchen EPINEPHrine (EPIPEN) 0.3 mg/0.3 mL DEVI   Intramuscular   Inject 0.3 mg into the muscle once.           Marland Kitchen  HYDROcodone-acetaminophen (NORCO/VICODIN) 5-325 MG per tablet      Take one tab po q 4-6 hrs prn pain   12 tablet   0   . loratadine (CLARITIN) 10 MG tablet   Oral   Take 10 mg by mouth at bedtime.           . methylphenidate (CONCERTA) 54 MG CR tablet   Oral   Take 54 mg by mouth every morning.         . montelukast (SINGULAIR) 10 MG tablet   Oral   Take 10 mg by mouth at bedtime.           Marland Kitchen omeprazole (PRILOSEC) 40 MG capsule   Oral   Take 40 mg by mouth every morning.          . sertraline (ZOLOFT) 25 MG tablet   Oral   Take 50 mg by mouth daily.            Triage Vitals: BP 133/86  Pulse 96  Temp(Src) 98.3 F  (36.8 C) (Oral)  Resp 18  Ht 5' 6"  (1.676 m)  Wt 190 lb (86.183 kg)  BMI 30.68 kg/m2  SpO2 100%  Physical Exam  Nursing note and vitals reviewed. Constitutional: He is oriented to person, place, and time. He appears well-developed and well-nourished.  HENT:  Head: Normocephalic and atraumatic.  Eyes: Conjunctivae are normal.  Neck: No tracheal deviation present.  Cardiovascular: Normal rate.   Pulmonary/Chest: Effort normal. No respiratory distress.  Genitourinary:  Mild left inguinal tenderness, no hernia, scrotum, testicles and penis are normal  Musculoskeletal: Normal range of motion.  Neurological: He is alert and oriented to person, place, and time.  Skin: Skin is warm and dry.  Psychiatric: He has a normal mood and affect. His behavior is normal.    ED Course  Procedures (including critical care time)  DIAGNOSTIC STUDIES: Oxygen Saturation is 100% on room air, norma by my interpretation.    COORDINATION OF CARE: 6:55 PM-Informed pt of radiology results. Advised that I believe his symptoms are most likely a left groin strain. Discussed discharge plan which includes ibuprofen and motrin for pain with pt and pt agreed to plan. Also advised pt to follow up as needed and pt agreed. Addressed symptoms to return for with pt.   Labs Reviewed - No data to display  US Scrotum  10/13/2012  *RADIOLOGY REPORT*  Clinical Data:  Left scrotal pain  SCROTAL ULTRASOUND DOPPLER ULTRASOUND OF THE TESTICLES  Technique: Complete ultrasound examination of the testicles, epididymis, and other scrotal structures was performed.  Color and spectral Doppler ultrasound were also utilized to evaluate blood flow to the testicles.  Comparison:  None  Findings:  Right testis:  3.9 x 2.0 x 3.2 cm.  Homogeneous echogenicity. Negative for mass lesion  Left testis:  4.2 x 2.0 x 2.7 cm.  Homogeneous echogenicity. Negative for mass lesion.  Right epididymis:  Normal in size and appearance.  Left epididymis:   Normal in size and appearance.  Hydrocele:  Absent  Varicocele:  Absent  Pulsed Doppler interrogation of both testes demonstrates low resistance flow bilaterally.  IMPRESSION: Normal   Original Report Authenticated By: Carl Best, M.D.    [place] Art/ven Flow Abd Pelv Doppler  10/13/2012  *RADIOLOGY REPORT*  Clinical Data:  Left scrotal pain  SCROTAL ULTRASOUND DOPPLER ULTRASOUND OF THE TESTICLES  Technique: Complete ultrasound examination of the testicles, epididymis, and other scrotal structures was performed.  Color and spectral Doppler ultrasound were  also utilized to evaluate blood flow to the testicles.  Comparison:  None  Findings:  Right testis:  3.9 x 2.0 x 3.2 cm.  Homogeneous echogenicity. Negative for mass lesion  Left testis:  4.2 x 2.0 x 2.7 cm.  Homogeneous echogenicity. Negative for mass lesion.  Right epididymis:  Normal in size and appearance.  Left epididymis:  Normal in size and appearance.  Hydrocele:  Absent  Varicocele:  Absent  Pulsed Doppler interrogation of both testes demonstrates low resistance flow bilaterally.  IMPRESSION: Normal   Original Report Authenticated By: Carl Best, M.D.      No diagnosis found.    MDM    The chart was scribed for me under my direct supervision.  I personally performed the history, physical, and medical decision making and all procedures in the evaluation of this patient.[name], MD 10/15/12 [phone]

## 2012-10-13 NOTE — ED Notes (Signed)
Pt reports was taking a shower and was washing his scrotum.  Reports sudden onset of pain in left testicle.  Denies any injury.  Mother reports pt has inguinal hernia in left groin also.

## 2012-10-18 ENCOUNTER — Other Ambulatory Visit: Payer: Self-pay | Admitting: Pediatrics

## 2012-11-11 ENCOUNTER — Emergency Department (HOSPITAL_COMMUNITY)
Admission: EM | Admit: 2012-11-11 | Discharge: 2012-11-11 | Disposition: A | Discharge status: Home / Self Care | Payer: Medicaid Other | Attending: Emergency Medicine | Admitting: Emergency Medicine

## 2012-11-11 ENCOUNTER — Emergency Department (HOSPITAL_COMMUNITY): Payer: Medicaid Other

## 2012-11-11 ENCOUNTER — Encounter (HOSPITAL_COMMUNITY): Payer: Self-pay | Admitting: Emergency Medicine

## 2012-11-11 DIAGNOSIS — F319 Bipolar disorder, unspecified: Secondary | ICD-10-CM | POA: Insufficient documentation

## 2012-11-11 DIAGNOSIS — R071 Chest pain on breathing: Secondary | ICD-10-CM | POA: Insufficient documentation

## 2012-11-11 DIAGNOSIS — F411 Generalized anxiety disorder: Secondary | ICD-10-CM | POA: Insufficient documentation

## 2012-11-11 DIAGNOSIS — Z79899 Other long term (current) drug therapy: Secondary | ICD-10-CM | POA: Insufficient documentation

## 2012-11-11 DIAGNOSIS — F909 Attention-deficit hyperactivity disorder, unspecified type: Secondary | ICD-10-CM | POA: Insufficient documentation

## 2012-11-11 DIAGNOSIS — Z880 Allergy status to penicillin: Secondary | ICD-10-CM | POA: Insufficient documentation

## 2012-11-11 DIAGNOSIS — J45909 Unspecified asthma, uncomplicated: Secondary | ICD-10-CM | POA: Insufficient documentation

## 2012-11-11 DIAGNOSIS — R0789 Other chest pain: Secondary | ICD-10-CM

## 2012-11-11 DIAGNOSIS — R112 Nausea with vomiting, unspecified: Secondary | ICD-10-CM | POA: Insufficient documentation

## 2012-11-11 DIAGNOSIS — K920 Hematemesis: Secondary | ICD-10-CM | POA: Insufficient documentation

## 2012-11-11 DIAGNOSIS — K219 Gastro-esophageal reflux disease without esophagitis: Secondary | ICD-10-CM | POA: Insufficient documentation

## 2012-11-11 DIAGNOSIS — Z87891 Personal history of nicotine dependence: Secondary | ICD-10-CM | POA: Insufficient documentation

## 2012-11-11 HISTORY — DX: Anxiety disorder, unspecified: F41.9

## 2012-11-11 LAB — COMPREHENSIVE METABOLIC PANEL
ALT: 10 U/L (ref 0–53)
AST: 19 U/L (ref 0–37)
Albumin: 4.4 g/dL (ref 3.5–5.2)
Alkaline Phosphatase: 65 U/L (ref 39–117)
BUN: 8 mg/dL (ref 6–23)
CO2: 25 mEq/L (ref 19–32)
Calcium: 9.9 mg/dL (ref 8.4–10.5)
Chloride: 103 mEq/L (ref 96–112)
Creatinine, Ser: 1.04 mg/dL (ref 0.50–1.35)
GFR calc Af Amer: >90 mL/min (ref >90)
GFR calc non Af Amer: >90 mL/min (ref >90)
Glucose, Bld: 100 mg/dL — ABNORMAL HIGH (ref 70–99)
Potassium: 3.9 mEq/L (ref 3.5–5.1)
Sodium: 140 mEq/L (ref 135–145)
Total Bilirubin: 0.4 mg/dL (ref 0.3–1.2)
Total Protein: 7.5 g/dL (ref 6.0–8.3)

## 2012-11-11 LAB — CBC WITH DIFFERENTIAL/PLATELET
Basophils Absolute: 0 10*3/uL (ref 0.0–0.1)
Basophils Relative: 0 % (ref 0–1)
Eosinophils Absolute: 0 10*3/uL (ref 0.0–0.7)
Eosinophils Relative: 0 % (ref 0–5)
HCT: 43.6 % (ref 39.0–52.0)
Hemoglobin: 15.5 g/dL (ref 13.0–17.0)
Lymphocytes Relative: 21 % (ref 12–46)
Lymphs Abs: 1.7 10*3/uL (ref 0.7–4.0)
MCH: 28.7 pg (ref 26.0–34.0)
MCHC: 35.6 g/dL (ref 30.0–36.0)
MCV: 80.7 fL (ref 78.0–100.0)
Monocytes Absolute: 0.5 10*3/uL (ref 0.1–1.0)
Monocytes Relative: 6 % (ref 3–12)
Neutro Abs: 6 10*3/uL (ref 1.7–7.7)
Neutrophils Relative %: 72 % (ref 43–77)
Platelets: 249 10*3/uL (ref 150–400)
RBC: 5.4 MIL/uL (ref 4.22–5.81)
RDW: 12.7 % (ref 11.5–15.5)
WBC: 8.3 10*3/uL (ref 4.0–10.5)

## 2012-11-11 LAB — TROPONIN I: Troponin I: <0.3 ng/mL (ref <0.30)

## 2012-11-11 LAB — LIPASE, BLOOD: Lipase: 25 U/L (ref 11–59)

## 2012-11-11 MED ORDER — PANTOPRAZOLE SODIUM 40 MG IV SOLR
40.0000 mg | Freq: Once | INTRAVENOUS | Status: AC
  Administered 2012-11-11: 40 mg via INTRAVENOUS
  Filled 2012-11-11: qty 40

## 2012-11-11 MED ORDER — ONDANSETRON HCL 4 MG PO TABS: 4.0000 mg | ORAL_TABLET | Freq: Three times a day (TID) | ORAL | Status: DC | PRN

## 2012-11-11 MED ORDER — ONDANSETRON HCL 4 MG/2ML IJ SOLN
4.0000 mg | INTRAMUSCULAR | Status: DC | PRN
  Administered 2012-11-11: 4 mg via INTRAVENOUS
  Filled 2012-11-11: qty 2

## 2012-11-11 MED ORDER — FAMOTIDINE IN NACL 20-0.9 MG/50ML-% IV SOLN
20.0000 mg | Freq: Once | INTRAVENOUS | Status: AC
  Administered 2012-11-11: 20 mg via INTRAVENOUS
  Filled 2012-11-11: qty 50

## 2012-11-11 MED ORDER — SODIUM CHLORIDE 0.9 % IV SOLN
INTRAVENOUS | Status: DC
  Administered 2012-11-11: 19:00:00 via INTRAVENOUS

## 2012-11-11 NOTE — ED Notes (Signed)
Patient c/o chest mid-sternal chest pain, nonradiating. Denies any shortness breath. Per mother patient woke with chest pain last night and was diaphoretic, went by EMS to [facility] and was diagnosed with chest wall pain and hypertension. Patient vomiting moderate amount of blood this morning. Patient received ibuprofen last night at [facility] with no relief.

## 2012-11-11 NOTE — ED Notes (Signed)
Pt c/o mid-sternal chest pain since last night. Pt was seen at [facility] ER last night and told "everything was ok". Pt states early this morning he began "spitting up and throwing up blood". Pt denies abdominal pain, SOB, lightheadedness, dizziness.

## 2012-11-11 NOTE — ED Notes (Signed)
Pt able to drink sprite without n/v/d.

## 2012-11-11 NOTE — ED Provider Notes (Signed)
History     CSN: [id number]  Arrival date & time 11/11/12  1618   First MD Initiated Contact with Patient 11/11/12 1713      Chief Complaint  Patient presents with  . Chest Pain  . Hematemesis    HPI Pt was seen at 1750.  Per pt and his mother, c/o gradual onset and persistence of constant mid-sternal chest "pain" that began last night. Pt was eval at [facility] ED last night for same and dx with chest wall pain. Pt's mother states he woke up today with multiple episodes of N/V.  Describes the emesis as "spitting up and throwing up blood."  Pt's mother states he has been taking his Prilosec as prescribed. Denies SOB/cough, no hemoptysis, no abd pain, no back pain, no diarrhea, no black or blood in stools.  The symptoms have been associated with no other complaints. The patient has a significant history of similar symptoms previously, recently being evaluated for this complaint and prior evals for same.      Past Medical History  Diagnosis Date  . Asthma   . ADHD (attention deficit hyperactivity disorder)   . Bipolar 1 disorder   . GERD (gastroesophageal reflux disease)   . Depression   . Anxiety     Past Surgical History  Procedure Laterality Date  . Tonsillectomy    . Adenoidectomy      Family History  Problem Relation Age of Onset  . Asthma Mother     History  Substance Use Topics  . Smoking status: Former Smoker    Types: Cigarettes  . Smokeless tobacco: Never Used  . Alcohol Use: No      Review of Systems ROS: Statement: All systems negative except as marked or noted in the HPI; Constitutional: Negative for fever and chills. ; ; Eyes: Negative for eye pain, redness and discharge. ; ; ENMT: Negative for ear pain, hoarseness, nasal congestion, sinus pressure and sore throat. ; ; Cardiovascular: +CP. Negative for palpitations, diaphoresis, dyspnea and peripheral edema. ; ; Respiratory: Negative for cough, wheezing and stridor. ; ; Gastrointestinal: +N/V, hematemesis.  Negative for diarrhea, abdominal pain, blood in stool, jaundice and rectal bleeding. . ; ; Genitourinary: Negative for dysuria, flank pain and hematuria. ; ; Musculoskeletal: Negative for back pain and neck pain. Negative for swelling and trauma.; ; Skin: Negative for pruritus, rash, abrasions, blisters, bruising and skin lesion.; ; Neuro: Negative for headache, lightheadedness and neck stiffness. Negative for weakness, altered level of consciousness , altered mental status, extremity weakness, paresthesias, involuntary movement, seizure and syncope.       Allergies  Amoxicillin-pot clavulanate; Pineapple; Strawberry; and Tomato  Home Medications   Current Outpatient Rx  Name  Route  Sig  Dispense  Refill  . albuterol (PROVENTIL HFA;VENTOLIN HFA) 108 (90 BASE) MCG/ACT inhaler   Inhalation   Inhale 2 puffs into the lungs every 6 (six) hours as needed. For shortness of breath          . albuterol (PROVENTIL) (2.5 MG/3ML) 0.083% nebulizer solution   Nebulization   Take 2.5 mg by nebulization every 6 (six) hours as needed. For shortness of breath         . atomoxetine (STRATTERA) 100 MG capsule   Oral   Take 100 mg by mouth every morning.          . cloNIDine (CATAPRES) 0.2 MG tablet   Oral   Take 0.2 mg by mouth at bedtime.         Marland Kitchen  EPINEPHrine (EPIPEN) 0.3 mg/0.3 mL DEVI   Intramuscular   Inject 0.3 mg into the muscle once.           Marland Kitchen HYDROcodone-acetaminophen (NORCO/VICODIN) 5-325 MG per tablet      Take one tab po q 4-6 hrs prn pain   12 tablet   0   . loratadine (CLARITIN) 10 MG tablet   Oral   Take 10 mg by mouth at bedtime.           . methylphenidate (CONCERTA) 54 MG CR tablet   Oral   Take 54 mg by mouth every morning.         . montelukast (SINGULAIR) 10 MG tablet   Oral   Take 10 mg by mouth at bedtime.           Marland Kitchen omeprazole (PRILOSEC) 40 MG capsule   Oral   Take 40 mg by mouth every morning.          . sertraline (ZOLOFT) 25 MG  tablet   Oral   Take 50 mg by mouth daily.            BP 135/81  Pulse 70  Temp(Src) 98.8 F (37.1 C) (Oral)  Resp 19  Ht 5' 8"  (1.727 m)  Wt 190 lb (86.183 kg)  BMI 28.9 kg/m2  SpO2 98%  Physical Exam 1745: Physical examination:  Nursing notes reviewed; Vital signs and O2 SAT reviewed;  Constitutional: Well developed, Well nourished, Well hydrated, In no acute distress; Head:  Normocephalic, atraumatic; Eyes: EOMI, PERRL, No scleral icterus; ENMT: Mouth and pharynx normal, Mucous membranes moist; Neck: Supple, Full range of motion, No lymphadenopathy; Cardiovascular: Regular rate and rhythm, No murmur, rub, or gallop; Respiratory: Breath sounds clear & equal bilaterally, No rales, rhonchi, wheezes.  Speaking full sentences with ease, Normal respiratory effort/excursion; Chest: +bilat anterior chest wall tender to palp. No rash, no soft tissue crepitus. Movement normal; Abdomen: Soft, Nontender, Nondistended, Normal bowel sounds. Rectal exam performed w/permission of pt and ED RN chaperone present.  Anal tone normal.  Non-tender, soft brown stool in rectal vault, heme neg.  No fissures, no external hemorrhoids, no palp masses.;;; Genitourinary: No CVA tenderness; Extremities: Pulses normal, No tenderness, No edema, No calf edema or asymmetry.; Neuro: AA&Ox3, Major CN grossly intact.  Speech clear. No gross focal motor or sensory deficits in extremities.; Skin: Color normal, Warm, Dry.; Psych:  Affect flat, poor eye contact, HPI mostly given by his mother and with his eyes closed.    ED Course  Procedures      MDM  MDM Reviewed: previous chart, nursing note and vitals Reviewed previous: labs and ECG Interpretation: labs, ECG and x-ray    Date: 11/11/2012  Rate: 69  Rhythm: normal sinus rhythm  QRS Axis: normal  Intervals: normal  ST/T Wave abnormalities: nonspecific T wave changes  Conduction Disutrbances:none  Narrative Interpretation:   Old EKG Reviewed: unchanged; no  significant changes from previous EKG dated 09/11/2010.   Results for orders placed during the hospital encounter of 11/11/12  CBC WITH DIFFERENTIAL      Result Value Range   WBC 8.3  4.0 - 10.5 K/uL   RBC 5.40  4.22 - 5.81 MIL/uL   Hemoglobin 15.5  13.0 - 17.0 g/dL   HCT 43.6  39.0 - 52.0 %   MCV 80.7  78.0 - 100.0 fL   MCH 28.7  26.0 - 34.0 pg   MCHC 35.6  30.0 - 36.0 g/dL   RDW  12.7  11.5 - 15.5 %   Platelets 249  150 - 400 K/uL   Neutrophils Relative % 72  43 - 77 %   Neutro Abs 6.0  1.7 - 7.7 K/uL   Lymphocytes Relative 21  12 - 46 %   Lymphs Abs 1.7  0.7 - 4.0 K/uL   Monocytes Relative 6  3 - 12 %   Monocytes Absolute 0.5  0.1 - 1.0 K/uL   Eosinophils Relative 0  0 - 5 %   Eosinophils Absolute 0.0  0.0 - 0.7 K/uL   Basophils Relative 0  0 - 1 %   Basophils Absolute 0.0  0.0 - 0.1 K/uL  COMPREHENSIVE METABOLIC PANEL      Result Value Range   Sodium 140  135 - 145 mEq/L   Potassium 3.9  3.5 - 5.1 mEq/L   Chloride 103  96 - 112 mEq/L   CO2 25  19 - 32 mEq/L   Glucose, Bld 100 (*) 70 - 99 mg/dL   BUN 8  6 - 23 mg/dL   Creatinine, Ser 1.04  0.50 - 1.35 mg/dL   Calcium 9.9  8.4 - 10.5 mg/dL   Total Protein 7.5  6.0 - 8.3 g/dL   Albumin 4.4  3.5 - 5.2 g/dL   AST 19  0 - 37 U/L   ALT 10  0 - 53 U/L   Alkaline Phosphatase 65  39 - 117 U/L   Total Bilirubin 0.4  0.3 - 1.2 mg/dL   GFR calc non Af Amer >90  >90 mL/min   GFR calc Af Amer >90  >90 mL/min  LIPASE, BLOOD      Result Value Range   Lipase 25  11 - 59 U/L  TROPONIN I      Result Value Range   Troponin I <0.30  <0.30 ng/mL   Dg Chest 2 View 11/11/2012   *RADIOLOGY REPORT*  Clinical Data: Chest pain and hematemesis.  CHEST - 2 VIEW  Comparison: 08/11/2012.  Findings: Normal sized heart.  Clear lungs.  Normal appearing bones.  IMPRESSION: Normal examination.   Original Report Authenticated By: Claudie Revering, M.D.    2110: Pt has tol PO well while in the ED without N/V.  No stooling while in the ED.  Abd remains  benign, VSS. Doubt PE as cause for symptoms with normal d-dimer and low risk [place].  Doubt ACS as cause for symptoms with normal troponin and unchanged EKG from previous after 2 days of constant symptoms. Will tx chest wall pain symptomatically at this time. Pt with several ED visits for N/V; will refer to GI MD for further eval. Pt wants to go home now. Dx and testing d/w pt and family.  Questions answered.  Verb understanding, agreeable to d/c home with outpt f/u.            Alfonzo Feller, DO 11/13/12 1313

## 2012-11-15 ENCOUNTER — Ambulatory Visit (INDEPENDENT_AMBULATORY_CARE_PROVIDER_SITE_OTHER): Payer: Medicaid Other | Admitting: Pediatrics

## 2012-11-15 ENCOUNTER — Encounter: Payer: Self-pay | Admitting: Pediatrics

## 2012-11-15 VITALS — HR 94 | Temp 98.7°F | Wt 190.0 lb

## 2012-11-15 DIAGNOSIS — Z889 Allergy status to unspecified drugs, medicaments and biological substances status: Secondary | ICD-10-CM

## 2012-11-15 DIAGNOSIS — F819 Developmental disorder of scholastic skills, unspecified: Secondary | ICD-10-CM

## 2012-11-15 DIAGNOSIS — Z9109 Other allergy status, other than to drugs and biological substances: Secondary | ICD-10-CM

## 2012-11-15 DIAGNOSIS — K279 Peptic ulcer, site unspecified, unspecified as acute or chronic, without hemorrhage or perforation: Secondary | ICD-10-CM

## 2012-11-15 DIAGNOSIS — Z7251 High risk heterosexual behavior: Secondary | ICD-10-CM

## 2012-11-15 HISTORY — DX: Developmental disorder of scholastic skills, unspecified: F81.9

## 2012-11-15 MED ORDER — LORATADINE 10 MG PO TABS: 10.0000 mg | ORAL_TABLET | Freq: Every day | ORAL | Status: DC

## 2012-11-15 MED ORDER — EPINEPHRINE 0.3 MG/0.3ML IJ SOAJ: 0.3000 mg | Freq: Once | INTRAMUSCULAR | Status: DC

## 2012-11-15 MED ORDER — CLARITHROMYCIN 500 MG PO TABS: 500.0000 mg | ORAL_TABLET | Freq: Two times a day (BID) | ORAL | Status: AC

## 2012-11-15 MED ORDER — METRONIDAZOLE 500 MG PO TABS: 500.0000 mg | ORAL_TABLET | Freq: Two times a day (BID) | ORAL | Status: AC

## 2012-11-15 NOTE — Progress Notes (Signed)
Patient ID: Wayne Avila, male   DOB: 1990/11/17, 22 y.o.   MRN: [id number]  Subjective:     Patient ID: Wayne Avila, male   DOB: 03-Jun-1991, 22 y.o.   MRN: [id number]  HPI: Pt is here with mom. He was seen in the ER a few days ago for mid sternal pain and emesis associated with some blood. He had been taken by ambulance the night before to another ER where the chest pain was deemed related to the chest wall. The pain was not related to eating or drinking. He had just been sitting in bed watching TV. He also began to cough with some blood seen. There was no fever or diarrhea. No URI symptoms. No sob or wheezing. Pian was like a " heaviness on his chest". No radiation. No neuro symptoms. He has a h/o GER and takes Prilosec daily. Denies constipation. No blood in stools or black tarry stools. Personal history from the pt is not very clear due to delays.  The pt has had several visits to the ER and office for abdominal pain and emesis. He also has almost monthly visits for various pains and anxiety/ depression. He has mild to moderate cognitive delays. He sees [facility] and is currently on Concerta 54, Strattera, Clonidine and also on Zoloft and Olanzepine. His weight is up again after a drop at last visit 4 m ago. He had stopped the zyprexa/ olanzepine during that time. He has had an ER visit for "seizure like activity" that was found to be false and attention seeking recently. The pt denies smoking, alcohol or other drug use. Urine drug screen was negative last visit. He has had some high risk sexual behavior in the past. Currently is expecting a child with a woman whom he is not in a stable relationship with. A few months ago there was another woman who came to his house and claimed to be pregnant by him. She moved in and caused the pt much stress. STD w/u was drawn but not processed by the lab, last visit. At this time the pt is in a better mood and denies any SI/ HI. He does not work. He stays at home and is  thinking of signing up for online classes.  There is a h/o asthma that has been stable. He takes Singulair, Claritin and uses albuterol prn, infrequently. He used to be a smoker but has quit months ago.    ROS:  Apart from the symptoms reviewed above, there are no other symptoms referable to all systems reviewed.   Physical Examination  Pulse 94, temperature 98.7 F (37.1 C), temperature source Temporal, weight 190 lb (86.183 kg). General: Alert, NAD, quiet, slow mentation, but oriented. History is unclear from the pt, but mom fills in what she knows. HEENT: TM's - clear, Throat - clear, Neck - FROM, no meningismus, Sclera - clear. No palor. LYMPH NODES: No LN noted LUNGS: CTA B CV: RRR without Murmurs ABD: Soft, mild tenderness over midsternal area. No epigastric pain., +BS, No HSM GU: Not Examined SKIN: Clear, No rashes noted NEUROLOGICAL: Grossly intact MUSCULOSKELETAL: Not examined  Dg Chest 2 View  11/11/2012   *RADIOLOGY REPORT*  Clinical Data: Chest pain and hematemesis.  CHEST - 2 VIEW  Comparison: 08/11/2012.  Findings: Normal sized heart.  Clear lungs.  Normal appearing bones.  IMPRESSION: Normal examination.   Original Report Authenticated By: Claudie Revering, M.D.    No results found for this or any previous visit (from the  past 48 hour(s)).  Assessment:   Heartburn: GER with possibly peptic ulcer. No anemia on ER blood work.   Chronic issues: Asthma, Dev delays, Depression/ anxiety/ mood disorder, H/o high risk sexual behavior.  Plan:   Will draw STD panel and H pylori today. Treat for H pylori: Prilosec, Flagyl (h/o of amoxicillin reaction?) and Clindamycin. Discussed dietary measures to help with GER Continue f/u with [facility]. RTC as scheduled soon.

## 2012-11-15 NOTE — Patient Instructions (Signed)
Peptic Ulcer A peptic ulcer is a sore in the lining of in your esophagus (esophageal ulcer), stomach (gastric ulcer), or in the first part of your small intestine (duodenal ulcer). The ulcer causes erosion into the deeper tissue. CAUSES  Normally, the lining of the stomach and the small intestine protects itself from the acid that digests food. The protective lining can be damaged by:  An infection caused by a bacterium called Helicobacter pylori (H. pylori).  Regular use of nonsteroidal anti-inflammatory drugs (NSAIDs), such as ibuprofen or aspirin.  Smoking tobacco. Other risk factors include being older than 46, drinking alcohol excessively, and having a family history of ulcer disease.  SYMPTOMS   Burning pain or gnawing in the area between the chest and the belly button.  Heartburn.  Nausea and vomiting.  Bloating. The pain can be worse on an empty stomach and at night. If the ulcer results in bleeding, it can cause:  Black, tarry stools.  Vomiting of bright red blood.  Vomiting of coffee ground looking materials. DIAGNOSIS  A diagnosis is usually made based upon your history and an exam. Other tests and procedures may be performed to find the cause of the ulcer. Finding a cause will help determine the best treatment. Tests and procedures may include:  Blood tests, stool tests, or breath tests to check for the bacterium H. pylori.  An upper gastrointestinal (GI) series of the esophagus, stomach, and small intestine.  An endoscopy to examine the esophagus, stomach, and small intestine.  A biopsy. TREATMENT  Treatment may include:  Eliminating the cause of the ulcer, such as smoking, NSAIDs, or alcohol.  Medicines to reduce the amount of acid in your digestive tract.  Antibiotic medicines if the ulcer is caused by the H. pylori bacterium.  An upper endoscopy to treat a bleeding ulcer.  Surgery if the bleeding is severe or if the ulcer created a hole somewhere in the  digestive system. HOME CARE INSTRUCTIONS   Avoid tobacco, alcohol, and caffeine. Smoking can increase the acid in the stomach, and continued smoking will impair the healing of ulcers.  Avoid foods and drinks that seem to cause discomfort or aggravate your ulcer.  Only take medicines as directed by your caregiver. Do not substitute over-the-counter medicines for prescription medicines without talking to your caregiver.  Keep any follow-up appointments and tests as directed. SEEK MEDICAL CARE IF:   Your do not improve within 7 days of starting treatment.  You have ongoing indigestion or heartburn. SEEK IMMEDIATE MEDICAL CARE IF:   You have sudden, sharp, or persistent abdominal pain.  You have bloody or dark black, tarry stools.  You vomit blood or vomit that looks like coffee grounds.  You become light headed, weak, or feel faint.  You become sweaty or clammy. MAKE SURE YOU:   Understand these instructions.  Will watch your condition.  Will get help right away if you are not doing well or get worse. Document Released: 05/23/2000 Document Revised: 02/18/2012 Document Reviewed: 12/24/2011 [facility] Patient Information 2014 [place].

## 2012-11-16 LAB — HSV 2 ANTIBODY, IGG: HSV 2 Glycoprotein G Ab, IgG: <0.1 IV

## 2012-11-16 LAB — STD PANEL
HIV: NONREACTIVE
Hepatitis B Surface Ag: NEGATIVE

## 2012-11-16 LAB — HEPATITIS C ANTIBODY, REFLEX: HCV Ab: NEGATIVE

## 2012-11-18 ENCOUNTER — Other Ambulatory Visit: Payer: Self-pay | Admitting: Pediatrics

## 2012-11-18 LAB — H. PYLORI ANTIBODY, IGA: H. pylori, IgA Abs: <9 units (ref 0.0–8.9)

## 2012-11-19 ENCOUNTER — Telehealth: Payer: Self-pay | Admitting: *Deleted

## 2012-11-19 NOTE — Progress Notes (Signed)
Mom informed. See telephone encounter

## 2012-11-19 NOTE — Telephone Encounter (Signed)
Mom informed that labs came back WNL and to continue with Prilosec

## 2012-11-19 NOTE — Telephone Encounter (Signed)
Message left for call back

## 2012-11-22 ENCOUNTER — Other Ambulatory Visit: Payer: Self-pay | Admitting: Pediatrics

## 2012-12-01 ENCOUNTER — Ambulatory Visit (INDEPENDENT_AMBULATORY_CARE_PROVIDER_SITE_OTHER): Payer: Medicaid Other | Admitting: Gastroenterology

## 2012-12-01 ENCOUNTER — Encounter: Payer: Self-pay | Admitting: Gastroenterology

## 2012-12-01 ENCOUNTER — Encounter (HOSPITAL_COMMUNITY): Payer: Self-pay | Admitting: Pharmacy Technician

## 2012-12-01 VITALS — BP 131/81 | HR 81 | Temp 97.9°F | Ht 68.0 in | Wt 192.4 lb

## 2012-12-01 DIAGNOSIS — R109 Unspecified abdominal pain: Secondary | ICD-10-CM

## 2012-12-01 DIAGNOSIS — K920 Hematemesis: Secondary | ICD-10-CM

## 2012-12-01 DIAGNOSIS — R1010 Upper abdominal pain, unspecified: Secondary | ICD-10-CM

## 2012-12-01 DIAGNOSIS — R112 Nausea with vomiting, unspecified: Secondary | ICD-10-CM

## 2012-12-01 NOTE — Progress Notes (Signed)
Primary Care Physician:  Graciella Freer, MD  Primary Gastroenterologist:  Barney Drain, MD   Chief Complaint  Patient presents with  . Gastrophageal Reflux  . Abdominal Pain    HPI:  Wayne Avila is a 22 y.o. male here for further evaluation of chronic intermittent abdominal pain, vomiting, GERD. Multiple ER visits for abdominal pain, N/V. Patient does not provide much history given cognitive delay/mental disorder. Mom says he has intermittent vomiting with diaphoresis without notice. Recent hematemesis. Initially seen in the ER in [place]. Transported via EMS. Had chest pain and hematemesis at that time. Per Mom, CXR, EKG normal. Nothing else done. Seen at [facility] ER on 11/11/12. Heme negative on DRE. CBC normal.  ED physician felt he was low risk for acute coronary syndrome and suspected chest wall pain. Suggested he see [place]. Lipase and LFTs were normal as well. Patient denies melena, rectal bleeding. No constipation, diarrhea. No appetite concerns. No heartburn, belching, dysphagia. Vomiting used to be quite frequent but not as bad over the past couple of weeks. Not taking any ASA/NSAIDS or Vicodin. No weight loss.  Current Outpatient Prescriptions  Medication Sig Dispense Refill  . albuterol (PROVENTIL HFA;VENTOLIN HFA) 108 (90 BASE) MCG/ACT inhaler Inhale 2 puffs into the lungs every 6 (six) hours as needed. For shortness of breath       . albuterol (PROVENTIL) (2.5 MG/3ML) 0.083% nebulizer solution Take 2.5 mg by nebulization every 6 (six) hours as needed. For shortness of breath      . atomoxetine (STRATTERA) 100 MG capsule Take 100 mg by mouth every morning.       . cloNIDine (CATAPRES) 0.2 MG tablet Take 0.2 mg by mouth at bedtime.      Marland Kitchen EPINEPHrine (EPIPEN) 0.3 mg/0.3 mL DEVI Inject 0.3 mLs (0.3 mg total) into the muscle once.  1 Device  2  . HYDROcodone-acetaminophen (NORCO/VICODIN) 5-325 MG per tablet Take 1 tablet by mouth every 4 (four) hours as needed. Take one tab po q 4-6 hrs prn  pain      . loratadine (CLARITIN) 10 MG tablet Take 1 tablet (10 mg total) by mouth at bedtime.  30 tablet  3  . methylphenidate (CONCERTA) 54 MG CR tablet Take 54 mg by mouth every morning.      . montelukast (SINGULAIR) 10 MG tablet TAKE 1 TABLET BY MOUTH EVERY NIGHT AT BEDTIME  30 tablet  0  . OLANZapine (ZYPREXA) 10 MG tablet Take 10 mg by mouth at bedtime.      Marland Kitchen omeprazole (PRILOSEC) 40 MG capsule TAKE 1 CAPSULE BY MOUTH EVERY DAY AS NEEDED FOR REFLUX  30 capsule  0  . ondansetron (ZOFRAN) 4 MG tablet Take 1 tablet (4 mg total) by mouth every 8 (eight) hours as needed for nausea.  6 tablet  0  . sertraline (ZOLOFT) 50 MG tablet Take 50 mg by mouth daily.       No current facility-administered medications for this visit.    Allergies as of 12/01/2012 - Review Complete 12/01/2012  Allergen Reaction Noted  . Amoxicillin-pot clavulanate Nausea And Vomiting 02/28/2011  . Pineapple Swelling 04/14/2011  . Strawberry Swelling 05/08/2012  . Tomato Rash 04/14/2011    Past Medical History  Diagnosis Date  . Asthma   . ADHD (attention deficit hyperactivity disorder)   . Bipolar 1 disorder   . GERD (gastroesophageal reflux disease)   . Depression   . Anxiety   . Mental developmental delay 11/15/2012    Past Surgical History  Procedure Laterality Date  . Tonsillectomy    . Adenoidectomy      Family History  Problem Relation Age of Onset  . Asthma Mother   . Ulcers Mother   . Colon cancer Neg Hx   . Liver disease Neg Hx     History   Social History  . Marital Status: Single    Spouse Name: N/A    Number of Children: N/A  . Years of Education: N/A   Occupational History  . Not on file.   Social History Main Topics  . Smoking status: Former Smoker    Types: Cigarettes  . Smokeless tobacco: Never Used  . Alcohol Use: No  . Drug Use: No  . Sexually Active: Not on file   Other Topics Concern  . Not on file   Social History Narrative  . No narrative on file       ROS:  General: Negative for anorexia, weight loss, fever, chills, fatigue, weakness. Eyes: Negative for vision changes.  ENT: Negative for hoarseness, difficulty swallowing , nasal congestion. CV: Negative for chest pain, angina, palpitations, dyspnea on exertion, peripheral edema. See history of present illness Respiratory: Negative for dyspnea at rest, dyspnea on exertion, cough, sputum, wheezing.  GI: See history of present illness. GU:  Negative for dysuria, hematuria, urinary incontinence, urinary frequency, nocturnal urination.  MS: Negative for joint pain, low back pain.  Derm: Negative for rash or itching.  Neuro: Negative for weakness, abnormal sensation, seizure, frequent headaches, memory loss, confusion.  Psych: Negative for anxiety, depression, suicidal ideation, hallucinations. Per mom, anger issues. Endo: Negative for unusual weight change.  Heme: Negative for bruising or bleeding. Allergy: Negative for rash or hives.    Physical Examination:  BP 131/81  Pulse 81  Temp(Src) 97.9 F (36.6 C) (Oral)  Ht 5' 8"  (1.727 m)  Wt 192 lb 6.4 oz (87.272 kg)  BMI 29.26 kg/m2   General: Well-nourished, well-developed in no acute distress. Lying on exam table the entire history with his eyes closed the majority of the time. Answers yes and no but does not elaborate. Mother provides majority of history. Head: Normocephalic, atraumatic.   Eyes: Conjunctiva pink, no icterus. Mouth: Oropharyngeal mucosa moist and pink , no lesions erythema or exudate. Neck: Supple without thyromegaly, masses, or lymphadenopathy.  Lungs: Clear to auscultation bilaterally.  Heart: Regular rate and rhythm, no murmurs rubs or gallops.  Abdomen: Bowel sounds are normal, nontender, nondistended, no hepatosplenomegaly or masses, no abdominal bruits or    hernia , no rebound or guarding.   Rectal: Not performed Extremities: No lower extremity edema. No clubbing or deformities.  Neuro: Alert and oriented  x 4 , grossly normal neurologically.  Skin: Warm and dry, no rash or jaundice.   Psych: Alert and cooperative, normal mood and affect.  Labs: H.Pylori IgA negative.  HCV Ab negative.  Lab Results  Component Value Date   WBC 8.3 11/11/2012   HGB 15.5 11/11/2012   HCT 43.6 11/11/2012   MCV 80.7 11/11/2012   PLT 249 11/11/2012   Lab Results  Component Value Date   CREATININE 1.04 11/11/2012   BUN 8 11/11/2012   NA 140 11/11/2012   K 3.9 11/11/2012   CL 103 11/11/2012   CO2 25 11/11/2012   Lab Results  Component Value Date   ALT 10 11/11/2012   AST 19 11/11/2012   ALKPHOS 65 11/11/2012   BILITOT 0.4 11/11/2012   Lab Results  Component Value Date   LIPASE  25 11/11/2012    Imaging Studies: Dg Chest 2 View  11/11/2012   *RADIOLOGY REPORT*  Clinical Data: Chest pain and hematemesis.  CHEST - 2 VIEW  Comparison: 08/11/2012.  Findings: Normal sized heart.  Clear lungs.  Normal appearing bones.  IMPRESSION: Normal examination.   Original Report Authenticated By: Claudie Revering, M.D.

## 2012-12-01 NOTE — Progress Notes (Signed)
CC PCP 

## 2012-12-01 NOTE — Assessment & Plan Note (Signed)
22 year old gentleman with intermittent vomiting, abdominal pain, chronic GERD. He has been on omeprazole chronically. Recently had negative H. pylori serologies but was treated empirically with Flagyl and Biaxin. Mother reports that he asks for a another omeprazole in the afternoons as if he may be having more heartburn or abdominal pain. Hematemesis may be secondary to Mallory-Weiss tear, gastritis, ulcers. Given chronicity of his symptoms without response to PPI, recommend upper endoscopy for further evaluation. Given polypharmacy and mental status/psychiatric disease, recommend deep sedation in the OR. Mom is agreeable.  I have discussed the risks, alternatives, benefits with regards to but not limited to the risk of reaction to medication, bleeding, infection, perforation and the patient is agreeable to proceed. Written consent to be obtained.  Continue omeprazole 37m daily for now. PPI may need adjusted but await EGD findings. Gallbladder remains in situ. If EGD unremarkable, consider gb w/u.

## 2012-12-01 NOTE — Patient Instructions (Addendum)
1. Continue omeprazole once daily for now. We may need to adjust the medication after your procedure. 2. Upper endoscopy with Dr. [name]. Please see separate instructions.

## 2012-12-06 ENCOUNTER — Encounter (HOSPITAL_COMMUNITY): Payer: Self-pay

## 2012-12-06 ENCOUNTER — Encounter (HOSPITAL_COMMUNITY)
Admission: RE | Admit: 2012-12-06 | Discharge: 2012-12-06 | Disposition: A | Discharge status: Home / Self Care | Payer: Medicaid Other | Source: Ambulatory Visit | Attending: Gastroenterology | Admitting: Gastroenterology

## 2012-12-06 HISTORY — DX: Nausea with vomiting, unspecified: R11.2

## 2012-12-06 HISTORY — DX: Other specified postprocedural states: Z98.890

## 2012-12-06 NOTE — Patient Instructions (Addendum)
Wayne Avila  12/06/2012   Your procedure is scheduled on:  12/07/2012  Report to [facility] at  800  AM.  Call this number if you have problems the morning of surgery: [phone]   Remember:   Do not eat food or drink liquids after midnight.   Take these medicines the morning of surgery with A SIP OF WATER: strattera, concerta, omeprazole, sertralazine. Take your albuterol inhaler and your nebulizer before you come.   Do not wear jewelry, make-up or nail polish.  Do not wear lotions, powders, or perfumes.   Do not shave 48 hours prior to surgery. Men may shave face and neck.  Do not bring valuables to the hospital.  [facility] is not responsible  for any belongings or valuables.  Contacts, dentures or bridgework may not be worn into surgery.  Leave suitcase in the car. After surgery it may be brought to your room.  For patients admitted to the hospital, checkout time is 11:00 AM the day of discharge.   Patients discharged the day of surgery will not be allowed to drive  home.  Name and phone number of your driver: family  Special Instructions: N/A   Please read over the following fact sheets that you were given: Pain Booklet, Coughing and Deep Breathing, Surgical Site Infection Prevention, Anesthesia Post-op Instructions and Care and Recovery After Surgery Esophagogastroduodenoscopy Esophagogastroduodenoscopy (EGD) is a procedure to examine the lining of the esophagus, stomach, and first part of the small intestine (duodenum). A long, flexible, lighted tube with a camera attached (endoscope) is inserted down the throat to view these organs. This procedure is done to detect problems or abnormalities, such as inflammation, bleeding, ulcers, or growths, in order to treat them. The procedure lasts about 5 20 minutes. It is usually an outpatient procedure, but it may need to be performed in emergency cases in the hospital. LET YOUR CAREGIVER KNOW ABOUT:   Allergies to food or  medicine.  All medicines you are taking, including vitamins, herbs, eyedrops, and over-the-counter medicines and creams.  Use of steroids (by mouth or creams).  Previous problems you or members of your family have had with the use of anesthetics.  Any blood disorders you have.  Previous surgeries you have had.  Other health problems you have.  Possibility of pregnancy, if this applies. RISKS AND COMPLICATIONS  Generally, EGD is a safe procedure. However, as with any procedure, complications can occur. Possible complications include:  Infection.  Bleeding.  Tearing (perforation) of the esophagus, stomach, or duodenum.  Difficulty breathing or not being able to breath.  Excessive sweating.  Spasms of the larynx.  Slowed heartbeat.  Low blood pressure. BEFORE THE PROCEDURE  Do not eat or drink anything for 6 8 hours before the procedure or as directed by your caregiver.  Ask your caregiver about changing or stopping your regular medicines.  If you wear dentures, be prepared to remove them before the procedure.  Arrange for someone to drive you home after the procedure. PROCEDURE   A vein will be accessed to give medicines and fluids. A medicine to relax you (sedative) and a pain reliever will be given through that access into the vein.  A numbing medicine (local anesthetic) may be sprayed on your throat for comfort and to stop you from gagging or coughing.  A mouth guard may be placed in your mouth to protect your teeth and to keep you from biting on the endoscope.  You  will be asked to lie on your left side.  The endoscope is inserted down your throat and into the esophagus, stomach, and duodenum.  Air is put through the endoscope to allow your caregiver to view the lining of your esophagus clearly.  The esophagus, stomach, and duodenum is then examined. During the exam, your caregiver may:  Remove tissue to be examined under a microscope (biopsy) for  inflammation, infection, or other medical problems.  Remove growths.  Remove objects (foreign bodies) that are stuck.  Treat any bleeding with medicines or other devices that stop tissues from bleeding (hot cauters, clipping devices).  Widen (dilate) or stretch narrowed areas of the esophagus and stomach.  The endoscope will then be withdrawn. AFTER THE PROCEDURE  You will be taken to a recovery area to be monitored. You will be able to go home once you are stable and alert.  Do not eat or drink anything until the local anesthetic and numbing medicines have worn off. You may choke.  It is normal to feel bloated, have pain with swallowing, or have a sore throat for a short time. This will wear off.  Your caregiver should be able to discuss his or her findings with you. It will take longer to discuss the test results if any biopsies were taken. Document Released: 09/26/2004 Document Revised: 05/12/2012 Document Reviewed: 04/28/2012 [facility] Patient Information 2014 [place], [place]. PATIENT INSTRUCTIONS POST-ANESTHESIA  IMMEDIATELY FOLLOWING SURGERY:  Do not drive or operate machinery for the first twenty four hours after surgery.  Do not make any important decisions for twenty four hours after surgery or while taking narcotic pain medications or sedatives.  If you develop intractable nausea and vomiting or a severe headache please notify your doctor immediately.  FOLLOW-UP:  Please make an appointment with your surgeon as instructed. You do not need to follow up with anesthesia unless specifically instructed to do so.  WOUND CARE INSTRUCTIONS (if applicable):  Keep a dry clean dressing on the anesthesia/puncture wound site if there is drainage.  Once the wound has quit draining you may leave it open to air.  Generally you should leave the bandage intact for twenty four hours unless there is drainage.  If the epidural site drains for more than 36-48 hours please call the anesthesia  department.  QUESTIONS?:  Please feel free to call your physician or the hospital operator if you have any questions, and they will be happy to assist you.

## 2012-12-07 ENCOUNTER — Encounter (HOSPITAL_COMMUNITY): Payer: Self-pay | Admitting: Anesthesiology

## 2012-12-07 ENCOUNTER — Encounter (HOSPITAL_COMMUNITY)
Admission: RE | Disposition: A | Discharge status: Home / Self Care | Payer: Self-pay | Source: Ambulatory Visit | Attending: Gastroenterology

## 2012-12-07 ENCOUNTER — Encounter (HOSPITAL_COMMUNITY): Payer: Self-pay

## 2012-12-07 ENCOUNTER — Ambulatory Visit (HOSPITAL_COMMUNITY): Payer: Medicaid Other | Admitting: Anesthesiology

## 2012-12-07 ENCOUNTER — Ambulatory Visit (HOSPITAL_COMMUNITY)
Admission: RE | Admit: 2012-12-07 | Discharge: 2012-12-07 | Disposition: A | Discharge status: Home / Self Care | Payer: Medicaid Other | Source: Ambulatory Visit | Attending: Gastroenterology | Admitting: Gastroenterology

## 2012-12-07 DIAGNOSIS — F3130 Bipolar disorder, current episode depressed, mild or moderate severity, unspecified: Secondary | ICD-10-CM | POA: Insufficient documentation

## 2012-12-07 DIAGNOSIS — K920 Hematemesis: Secondary | ICD-10-CM

## 2012-12-07 DIAGNOSIS — F430 Acute stress reaction: Secondary | ICD-10-CM | POA: Insufficient documentation

## 2012-12-07 DIAGNOSIS — F909 Attention-deficit hyperactivity disorder, unspecified type: Secondary | ICD-10-CM | POA: Insufficient documentation

## 2012-12-07 DIAGNOSIS — Z79899 Other long term (current) drug therapy: Secondary | ICD-10-CM | POA: Insufficient documentation

## 2012-12-07 DIAGNOSIS — Z87891 Personal history of nicotine dependence: Secondary | ICD-10-CM | POA: Insufficient documentation

## 2012-12-07 DIAGNOSIS — Z880 Allergy status to penicillin: Secondary | ICD-10-CM | POA: Insufficient documentation

## 2012-12-07 DIAGNOSIS — K2990 Gastroduodenitis, unspecified, without bleeding: Secondary | ICD-10-CM

## 2012-12-07 DIAGNOSIS — Z91018 Allergy to other foods: Secondary | ICD-10-CM | POA: Insufficient documentation

## 2012-12-07 DIAGNOSIS — R6250 Unspecified lack of expected normal physiological development in childhood: Secondary | ICD-10-CM | POA: Insufficient documentation

## 2012-12-07 DIAGNOSIS — J45909 Unspecified asthma, uncomplicated: Secondary | ICD-10-CM | POA: Insufficient documentation

## 2012-12-07 DIAGNOSIS — K219 Gastro-esophageal reflux disease without esophagitis: Secondary | ICD-10-CM | POA: Insufficient documentation

## 2012-12-07 DIAGNOSIS — F411 Generalized anxiety disorder: Secondary | ICD-10-CM | POA: Insufficient documentation

## 2012-12-07 DIAGNOSIS — K2940 Chronic atrophic gastritis without bleeding: Secondary | ICD-10-CM | POA: Insufficient documentation

## 2012-12-07 DIAGNOSIS — R1013 Epigastric pain: Secondary | ICD-10-CM

## 2012-12-07 DIAGNOSIS — K2970 Gastritis, unspecified, without bleeding: Secondary | ICD-10-CM

## 2012-12-07 HISTORY — PX: BIOPSY: SHX5522

## 2012-12-07 HISTORY — PX: ESOPHAGOGASTRODUODENOSCOPY (EGD) WITH PROPOFOL: SHX5813

## 2012-12-07 SURGERY — ESOPHAGOGASTRODUODENOSCOPY (EGD) WITH PROPOFOL
Anesthesia: Monitor Anesthesia Care

## 2012-12-07 MED ORDER — GLYCOPYRROLATE 0.2 MG/ML IJ SOLN
0.2000 mg | Freq: Once | INTRAMUSCULAR | Status: AC
  Administered 2012-12-07: 0.2 mg via INTRAVENOUS

## 2012-12-07 MED ORDER — LACTATED RINGERS IV SOLN
INTRAVENOUS | Status: DC
  Administered 2012-12-07: 10:00:00 via INTRAVENOUS

## 2012-12-07 MED ORDER — ONDANSETRON HCL 4 MG/2ML IJ SOLN: 4.0000 mg | Freq: Once | INTRAMUSCULAR | Status: DC | PRN

## 2012-12-07 MED ORDER — FENTANYL CITRATE 0.05 MG/ML IJ SOLN
INTRAMUSCULAR | Status: DC | PRN
  Administered 2012-12-07 (×3): 25 ug via INTRAVENOUS

## 2012-12-07 MED ORDER — PROPOFOL INFUSION 10 MG/ML OPTIME
INTRAVENOUS | Status: DC | PRN
  Administered 2012-12-07: 75 ug/kg/min via INTRAVENOUS

## 2012-12-07 MED ORDER — FENTANYL CITRATE 0.05 MG/ML IJ SOLN: 25.0000 ug | INTRAMUSCULAR | Status: DC | PRN

## 2012-12-07 MED ORDER — MIDAZOLAM HCL 2 MG/2ML IJ SOLN
INTRAMUSCULAR | Status: AC
  Filled 2012-12-07: qty 2

## 2012-12-07 MED ORDER — ONDANSETRON HCL 4 MG/2ML IJ SOLN
INTRAMUSCULAR | Status: AC
  Filled 2012-12-07: qty 2

## 2012-12-07 MED ORDER — GLYCOPYRROLATE 0.2 MG/ML IJ SOLN
INTRAMUSCULAR | Status: AC
  Filled 2012-12-07: qty 1

## 2012-12-07 MED ORDER — LIDOCAINE HCL (CARDIAC) 10 MG/ML IV SOLN
INTRAVENOUS | Status: DC | PRN
  Administered 2012-12-07: 50 mg via INTRAVENOUS

## 2012-12-07 MED ORDER — LIDOCAINE HCL (PF) 1 % IJ SOLN
INTRAMUSCULAR | Status: AC
  Filled 2012-12-07: qty 5

## 2012-12-07 MED ORDER — FENTANYL CITRATE 0.05 MG/ML IJ SOLN
INTRAMUSCULAR | Status: AC
  Filled 2012-12-07: qty 2

## 2012-12-07 MED ORDER — MIDAZOLAM HCL 5 MG/5ML IJ SOLN
INTRAMUSCULAR | Status: DC | PRN
  Administered 2012-12-07 (×2): 1 mg via INTRAVENOUS

## 2012-12-07 MED ORDER — LACTATED RINGERS IV SOLN
INTRAVENOUS | Status: DC | PRN
  Administered 2012-12-07: 09:00:00 via INTRAVENOUS

## 2012-12-07 MED ORDER — BUTAMBEN-TETRACAINE-BENZOCAINE 2-2-14 % EX AERO
1.0000 | INHALATION_SPRAY | Freq: Once | CUTANEOUS | Status: AC
  Administered 2012-12-07: 1 via TOPICAL
  Filled 2012-12-07: qty 56

## 2012-12-07 MED ORDER — FENTANYL CITRATE 0.05 MG/ML IJ SOLN
25.0000 ug | INTRAMUSCULAR | Status: DC | PRN
  Administered 2012-12-07: 25 ug via INTRAVENOUS

## 2012-12-07 MED ORDER — STERILE WATER FOR IRRIGATION IR SOLN
Status: DC | PRN
  Administered 2012-12-07: 10:00:00

## 2012-12-07 MED ORDER — ONDANSETRON HCL 4 MG/2ML IJ SOLN
4.0000 mg | Freq: Once | INTRAMUSCULAR | Status: AC
  Administered 2012-12-07: 4 mg via INTRAVENOUS

## 2012-12-07 MED ORDER — MIDAZOLAM HCL 2 MG/2ML IJ SOLN
1.0000 mg | INTRAMUSCULAR | Status: DC | PRN
  Administered 2012-12-07: 2 mg via INTRAVENOUS

## 2012-12-07 MED ORDER — PROPOFOL 10 MG/ML IV EMUL
INTRAVENOUS | Status: AC
  Filled 2012-12-07: qty 20

## 2012-12-07 SURGICAL SUPPLY — 10 items
BLOCK BITE 60FR ADLT L/F BLUE (MISCELLANEOUS) ×1 IMPLANT
FLOOR PAD 36X40 (MISCELLANEOUS) ×2
FORCEPS BIOP RAD 4 LRG CAP 4 (CUTTING FORCEPS) ×1 IMPLANT
FORMALIN 10 PREFIL 20ML (MISCELLANEOUS) ×1 IMPLANT
MANIFOLD NEPTUNE II (INSTRUMENTS) ×2 IMPLANT
PAD FLOOR 36X40 (MISCELLANEOUS) IMPLANT
SYR 50ML LL SCALE MARK (SYRINGE) ×1 IMPLANT
TUBING ENDO SMARTCAP PENTAX (MISCELLANEOUS) ×1 IMPLANT
TUBING IRRIGATION ENDOGATOR (MISCELLANEOUS) ×1 IMPLANT
WATER STERILE IRR 1000ML POUR (IV SOLUTION) ×1 IMPLANT

## 2012-12-07 NOTE — H&P (Addendum)
Primary Care Physician:  Graciella Freer, MD Primary Gastroenterologist:  Dr. [name]  Pre-Procedure History & Physical: HPI:  Wayne Avila is a 22 y.o. male here for HEMATEMESIS/ABDOMINAL PAIN.  Past Medical History  Diagnosis Date  . Asthma   . ADHD (attention deficit hyperactivity disorder)   . Bipolar 1 disorder   . GERD (gastroesophageal reflux disease)   . Depression   . Anxiety   . Mental developmental delay 11/15/2012  . PONV (postoperative nausea and vomiting)     Past Surgical History  Procedure Laterality Date  . Tonsillectomy    . Adenoidectomy      Prior to Admission medications   Medication Sig Start Date End Date Taking? Authorizing Provider  albuterol (PROVENTIL HFA;VENTOLIN HFA) 108 (90 BASE) MCG/ACT inhaler Inhale 2 puffs into the lungs every 6 (six) hours as needed. For shortness of breath    Yes Historical Provider, MD  albuterol (PROVENTIL) (2.5 MG/3ML) 0.083% nebulizer solution Take 2.5 mg by nebulization every 6 (six) hours as needed. For shortness of breath   Yes Historical Provider, MD  atomoxetine (STRATTERA) 100 MG capsule Take 100 mg by mouth every morning.    Yes Historical Provider, MD  cloNIDine (CATAPRES) 0.2 MG tablet Take 0.2 mg by mouth at bedtime.   Yes Historical Provider, MD  diphenhydrAMINE (BENADRYL) 25 mg capsule Take 25 mg by mouth every 6 (six) hours as needed for allergies (Allergic Reaction).   Yes Historical Provider, MD  loratadine (CLARITIN) 10 MG tablet Take 1 tablet (10 mg total) by mouth at bedtime. 11/15/12  Yes Dalia A Maretta Los, MD  methylphenidate (CONCERTA) 54 MG CR tablet Take 54 mg by mouth every morning.   Yes Historical Provider, MD  montelukast (SINGULAIR) 10 MG tablet Take 10 mg by mouth at bedtime.   Yes Historical Provider, MD  OLANZapine (ZYPREXA) 10 MG tablet Take 10 mg by mouth at bedtime.   Yes Historical Provider, MD  omeprazole (PRILOSEC) 40 MG capsule Take 40 mg by mouth daily.   Yes Historical Provider, MD   sertraline (ZOLOFT) 50 MG tablet Take 50 mg by mouth daily.   Yes Historical Provider, MD  EPINEPHrine (EPIPEN) 0.3 mg/0.3 mL DEVI Inject 0.3 mLs (0.3 mg total) into the muscle once. 11/15/12   Garvin Fila, MD  HYDROcodone-acetaminophen (NORCO/VICODIN) 5-325 MG per tablet Take 1 tablet by mouth every 4 (four) hours as needed. Take one tab po q 4-6 hrs prn pain 09/09/12   Tammy L. Triplett, PA-C    Allergies as of 12/01/2012 - Review Complete 12/01/2012  Allergen Reaction Noted  . Amoxicillin-pot clavulanate Nausea And Vomiting 02/28/2011  . Pineapple Swelling 04/14/2011  . Strawberry Swelling 05/08/2012  . Tomato Rash 04/14/2011    Family History  Problem Relation Age of Onset  . Asthma Mother   . Ulcers Mother   . Colon cancer Neg Hx   . Liver disease Neg Hx     History   Social History  . Marital Status: Single    Spouse Name: N/A    Number of Children: N/A  . Years of Education: N/A   Occupational History  . Not on file.   Social History Main Topics  . Smoking status: Former Smoker -- 1.00 packs/day for 2 years    Types: Cigarettes    Quit date: 10/06/2012  . Smokeless tobacco: Never Used  . Alcohol Use: No  . Drug Use: No  . Sexually Active: Not on file   Other Topics Concern  . Not  on file   Social History Narrative  . No narrative on file    Review of Systems: See HPI, otherwise negative ROS   Physical Exam: BP 137/86  Pulse 94  Temp(Src) 98.6 F (37 C) (Oral)  SpO2 99% General:   Alert,  pleasant and cooperative in NAD Head:  Normocephalic and atraumatic. Neck:  Supple; Lungs:  Clear throughout to auscultation.    Heart:  Regular rate and rhythm. Abdomen:  Soft, nontender and nondistended. Normal bowel sounds, without guarding, and without rebound.   Neurologic:  Alert and  oriented x4;  grossly normal neurologically.  Impression/Plan:     HEMATEMESIS/abdominal pain  PLAN:  EGD TODAY

## 2012-12-07 NOTE — Anesthesia Preprocedure Evaluation (Signed)
Anesthesia Evaluation  Patient identified by MRN, date of birth, ID band Patient awake    Reviewed: Allergy & Precautions, H&P , NPO status , Patient's Chart, lab work & pertinent test results  History of Anesthesia Complications (+) PONV  Airway Mallampati: I TM Distance: >3 FB     Dental  (+) Teeth Intact   Pulmonary asthma , former smoker,  breath sounds clear to auscultation        Cardiovascular Rhythm:Regular Rate:Normal     Neuro/Psych PSYCHIATRIC DISORDERS Anxiety Depression Bipolar Disorder ADHD   GI/Hepatic GERD-  ,  Endo/Other    Renal/GU      Musculoskeletal   Abdominal   Peds  Hematology   Anesthesia Other Findings   Reproductive/Obstetrics                           Anesthesia Physical Anesthesia Plan  ASA: III  Anesthesia Plan: MAC   Post-op Pain Management:    Induction: Intravenous  Airway Management Planned: Simple Face Mask  Additional Equipment:   Intra-op Plan:   Post-operative Plan:   Informed Consent: I have reviewed the patients History and Physical, chart, labs and discussed the procedure including the risks, benefits and alternatives for the proposed anesthesia with the patient or authorized representative who has indicated his/her understanding and acceptance.     Plan Discussed with:   Anesthesia Plan Comments:         Anesthesia Quick Evaluation

## 2012-12-07 NOTE — Op Note (Signed)
[place], [address]   ENDOSCOPY PROCEDURE REPORT  PATIENT: Wayne Avila, Wayne Avila.  MR#: [id number] BIRTHDATE: Nov 30, 1990 , 22  yrs. old GENDER: Male  ENDOSCOPIST: Barney Drain, MD REFERRED BY:   Graciella Freer, MD  PROCEDURE DATE: 12/07/2012 PROCEDURE:   EGD w/ biopsy  INDICATIONS:epigastric pain.   Hematemesis. HAS SIGNIFICANT STRESS RELATED TO UNWANTED AFFECTION FORM A WOMAN. PT ALLOWED MOTHER TO SPEAK FOR HIM. MEDICATIONS: MAC sedation, administered by CRNA TOPICAL ANESTHETIC:   Cetacaine Spray  DESCRIPTION OF PROCEDURE:     Physical exam was performed.  Informed consent was obtained from the patient after explaining the benefits, risks, and alternatives to the procedure.  The patient was connected to the monitor and placed in the left lateral position.  Continuous oxygen was provided by nasal cannula and IV medicine administered through an indwelling cannula.  After administration of sedation, the patients esophagus was intubated and the     endoscope was advanced under direct visualization to the second portion of the duodenum.  The scope was removed slowly by carefully examining the color, texture, anatomy, and integrity of the mucosa on the way out.  The patient was recovered in endoscopy and discharged home in satisfactory condition.      ESOPHAGUS: The mucosa of the esophagus appeared normal.  STOMACH: Mild non-erosive gastritis (inflammation) was found in the gastric antrum.  Multiple biopsies were performed using cold forceps.  DUODENUM: The duodenal mucosa showed no abnormalities in the bulb and second portion of the duodenum. COMPLICATIONS:   None  ENDOSCOPIC IMPRESSION: 1.   The mucosa of the esophagus appeared normal 2.   Non-erosive gastritis (inflammation) was found in the gastric antrum; multiple biopsies 3.   The duodenal mucosa showed no abnormalities in the bulb and second portion of the  duodenum  RECOMMENDATIONS: TAKE OMEPRAZOLE 30 MINUTES PRIOR TO YOUR FIRST MEAL DAILY. STRICTLY FOLLOW A LOW FAT DIET. BIOPSY WILL BE BACK IN 7 DAYS. CONSIDER CT ABD/PELVIS IF ABDOMINAL PAIN CONTINUES. FOLLOW UP IN OCT 2014.   REPEAT EXAM:   _______________________________ Lorrin MaisBarney Drain, MD 12/07/2012 2:46 PM       PATIENT NAME:  Wayne Avila. MR#: [id number]

## 2012-12-07 NOTE — Anesthesia Procedure Notes (Signed)
Procedure Name: MAC Performed by: Antony Contras, AMY L Pre-anesthesia Checklist: Timeout performed, Patient identified, Emergency Drugs available, Suction available and Patient being monitored Patient Re-evaluated:Patient Re-evaluated prior to inductionOxygen Delivery Method: Non-rebreather mask

## 2012-12-07 NOTE — Anesthesia Postprocedure Evaluation (Signed)
  Anesthesia Post-op Note  Patient: Wayne Avila  Procedure(s) Performed: Procedure(s): ESOPHAGOGASTRODUODENOSCOPY (EGD) WITH PROPOFOL (N/A) GASTRIC BIOPSIES  Patient Location: PACU  Anesthesia Type:MAC  Level of Consciousness: sedated and patient cooperative  Airway and Oxygen Therapy: Patient Spontanous Breathing and Patient connected to nasal cannula oxygen  Post-op Pain: none  Post-op Assessment: Post-op Vital signs reviewed, Patient's Cardiovascular Status Stable, Respiratory Function Stable, Patent Airway, No signs of Nausea or vomiting and Pain level controlled  Post-op Vital Signs: Reviewed and stable  Complications: No apparent anesthesia complications

## 2012-12-07 NOTE — Transfer of Care (Signed)
Immediate Anesthesia Transfer of Care Note  Patient: Wayne Avila  Procedure(s) Performed: Procedure(s): ESOPHAGOGASTRODUODENOSCOPY (EGD) WITH PROPOFOL (N/A) GASTRIC BIOPSIES  Patient Location: PACU  Anesthesia Type:MAC  Level of Consciousness: sedated and patient cooperative  Airway & Oxygen Therapy: Patient Spontanous Breathing and Patient connected to nasal cannula oxygen  Post-op Assessment: Report given to PACU RN and Post -op Vital signs reviewed and stable  Post vital signs: Reviewed and stable  Complications: No apparent anesthesia complications

## 2012-12-07 NOTE — Preoperative (Signed)
Beta Blockers   Reason not to administer Beta Blockers:Not Applicable 

## 2012-12-07 NOTE — OR Nursing (Signed)
Vitals WNL preoperatively monitored but did not pull over pulse 103, 138/81, 98%, 18 resp. Right before he went back to OR

## 2012-12-09 ENCOUNTER — Encounter (HOSPITAL_COMMUNITY): Payer: Self-pay | Admitting: Gastroenterology

## 2012-12-12 ENCOUNTER — Encounter (HOSPITAL_COMMUNITY): Payer: Self-pay | Admitting: Emergency Medicine

## 2012-12-12 ENCOUNTER — Emergency Department (HOSPITAL_COMMUNITY)
Admission: EM | Admit: 2012-12-12 | Discharge: 2012-12-12 | Disposition: A | Discharge status: Home / Self Care | Payer: Medicaid Other | Attending: Emergency Medicine | Admitting: Emergency Medicine

## 2012-12-12 ENCOUNTER — Emergency Department (HOSPITAL_COMMUNITY): Payer: Medicaid Other

## 2012-12-12 DIAGNOSIS — R1110 Vomiting, unspecified: Secondary | ICD-10-CM | POA: Insufficient documentation

## 2012-12-12 DIAGNOSIS — Z87891 Personal history of nicotine dependence: Secondary | ICD-10-CM | POA: Insufficient documentation

## 2012-12-12 DIAGNOSIS — F319 Bipolar disorder, unspecified: Secondary | ICD-10-CM | POA: Insufficient documentation

## 2012-12-12 DIAGNOSIS — Z79899 Other long term (current) drug therapy: Secondary | ICD-10-CM | POA: Insufficient documentation

## 2012-12-12 DIAGNOSIS — F3289 Other specified depressive episodes: Secondary | ICD-10-CM | POA: Insufficient documentation

## 2012-12-12 DIAGNOSIS — F411 Generalized anxiety disorder: Secondary | ICD-10-CM | POA: Insufficient documentation

## 2012-12-12 DIAGNOSIS — R6250 Unspecified lack of expected normal physiological development in childhood: Secondary | ICD-10-CM | POA: Insufficient documentation

## 2012-12-12 DIAGNOSIS — K219 Gastro-esophageal reflux disease without esophagitis: Secondary | ICD-10-CM | POA: Insufficient documentation

## 2012-12-12 DIAGNOSIS — F329 Major depressive disorder, single episode, unspecified: Secondary | ICD-10-CM | POA: Insufficient documentation

## 2012-12-12 DIAGNOSIS — J45909 Unspecified asthma, uncomplicated: Secondary | ICD-10-CM | POA: Insufficient documentation

## 2012-12-12 DIAGNOSIS — F909 Attention-deficit hyperactivity disorder, unspecified type: Secondary | ICD-10-CM | POA: Insufficient documentation

## 2012-12-12 LAB — URINALYSIS, ROUTINE W REFLEX MICROSCOPIC
Bilirubin Urine: NEGATIVE
Glucose, UA: NEGATIVE mg/dL
Hgb urine dipstick: NEGATIVE
Ketones, ur: NEGATIVE mg/dL
Leukocytes, UA: NEGATIVE
Nitrite: NEGATIVE
Protein, ur: NEGATIVE mg/dL
Specific Gravity, Urine: 1.02 (ref 1.005–1.030)
Urobilinogen, UA: 0.2 mg/dL (ref 0.0–1.0)
pH: 7 (ref 5.0–8.0)

## 2012-12-12 LAB — BASIC METABOLIC PANEL
BUN: 13 mg/dL (ref 6–23)
CO2: 27 mEq/L (ref 19–32)
Calcium: 10.6 mg/dL — ABNORMAL HIGH (ref 8.4–10.5)
Chloride: 100 mEq/L (ref 96–112)
Creatinine, Ser: 1.04 mg/dL (ref 0.50–1.35)
GFR calc Af Amer: >90 mL/min (ref >90)
GFR calc non Af Amer: >90 mL/min (ref >90)
Glucose, Bld: 118 mg/dL — ABNORMAL HIGH (ref 70–99)
Potassium: 4.6 mEq/L (ref 3.5–5.1)
Sodium: 136 mEq/L (ref 135–145)

## 2012-12-12 LAB — CBC WITH DIFFERENTIAL/PLATELET
Basophils Absolute: 0 10*3/uL (ref 0.0–0.1)
Basophils Relative: 0 % (ref 0–1)
Eosinophils Absolute: 0.1 10*3/uL (ref 0.0–0.7)
Eosinophils Relative: 1 % (ref 0–5)
HCT: 44.1 % (ref 39.0–52.0)
Hemoglobin: 15.9 g/dL (ref 13.0–17.0)
Lymphocytes Relative: 20 % (ref 12–46)
Lymphs Abs: 1.4 10*3/uL (ref 0.7–4.0)
MCH: 29.2 pg (ref 26.0–34.0)
MCHC: 36.1 g/dL — ABNORMAL HIGH (ref 30.0–36.0)
MCV: 80.9 fL (ref 78.0–100.0)
Monocytes Absolute: 0.4 10*3/uL (ref 0.1–1.0)
Monocytes Relative: 6 % (ref 3–12)
Neutro Abs: 5.1 10*3/uL (ref 1.7–7.7)
Neutrophils Relative %: 72 % (ref 43–77)
Platelets: 232 10*3/uL (ref 150–400)
RBC: 5.45 MIL/uL (ref 4.22–5.81)
RDW: 12.8 % (ref 11.5–15.5)
WBC: 7 10*3/uL (ref 4.0–10.5)

## 2012-12-12 LAB — HEPATIC FUNCTION PANEL
ALT: 15 U/L (ref 0–53)
AST: 22 U/L (ref 0–37)
Albumin: 4.3 g/dL (ref 3.5–5.2)
Alkaline Phosphatase: 70 U/L (ref 39–117)
Bilirubin, Direct: <0.1 mg/dL (ref 0.0–0.3)
Total Bilirubin: 0.3 mg/dL (ref 0.3–1.2)
Total Protein: 7.9 g/dL (ref 6.0–8.3)

## 2012-12-12 MED ORDER — ONDANSETRON 4 MG PO TBDP: ORAL_TABLET | ORAL | Status: DC

## 2012-12-12 NOTE — ED Notes (Signed)
Pt c/o r upper/lower and side pain today. No bowel movement changes. Denies urinary changes. Denies nausea. Vomited x 1 around 2 am today. Aching pain per pt.

## 2012-12-12 NOTE — ED Provider Notes (Signed)
History    CSN: [id number] Arrival date & time 12/12/12  1505  First MD Initiated Contact with Patient 12/12/12 1554     Chief Complaint  Patient presents with  . Abdominal Pain   (Consider location/radiation/quality/duration/timing/severity/associated sxs/prior Treatment) Patient is a 22 y.o. male presenting with abdominal pain. The history is provided by the patient (the pt complains of abd pain and vomiting). No language interpreter was used.  Abdominal Pain This is a new problem. The current episode started 12 to 24 hours ago. The problem occurs constantly. The problem has been gradually improving. Associated symptoms include abdominal pain. Pertinent negatives include no chest pain and no headaches. Nothing aggravates the symptoms. Nothing relieves the symptoms.   Past Medical History  Diagnosis Date  . Asthma   . ADHD (attention deficit hyperactivity disorder)   . Bipolar 1 disorder   . GERD (gastroesophageal reflux disease)   . Depression   . Anxiety   . Mental developmental delay 11/15/2012  . PONV (postoperative nausea and vomiting)    Past Surgical History  Procedure Laterality Date  . Tonsillectomy    . Adenoidectomy    . Esophagogastroduodenoscopy (egd) with propofol N/A 12/07/2012    Procedure: ESOPHAGOGASTRODUODENOSCOPY (EGD) WITH PROPOFOL;  Surgeon: [name], MD;  Location: [facility];  Service: Endoscopy;  Laterality: N/A;  . Esophageal biopsy N/A 12/07/2012    Procedure: GASTRIC BIOPSIES;  Surgeon: [name], MD;  Location: [facility];  Service: Endoscopy;  Laterality: N/A;   Family History  Problem Relation Age of Onset  . Asthma Mother   . Ulcers Mother   . Colon cancer Neg Hx   . Liver disease Neg Hx    History  Substance Use Topics  . Smoking status: Former Smoker -- 1.00 packs/day for 2 years    Types: Cigarettes    Quit date: 10/06/2012  . Smokeless tobacco: Never Used  . Alcohol Use: No    Review of Systems  Constitutional: Negative for  appetite change and fatigue.  HENT: Negative for congestion, sinus pressure and ear discharge.   Eyes: Negative for discharge.  Respiratory: Negative for cough.   Cardiovascular: Negative for chest pain.  Gastrointestinal: Positive for abdominal pain. Negative for diarrhea.  Genitourinary: Negative for frequency and hematuria.  Musculoskeletal: Negative for back pain.  Skin: Negative for rash.  Neurological: Negative for seizures and headaches.  Psychiatric/Behavioral: Negative for hallucinations.    Allergies  Amoxicillin-pot clavulanate; Pineapple; Strawberry; and Tomato  Home Medications   Current Outpatient Rx  Name  Route  Sig  Dispense  Refill  . atomoxetine (STRATTERA) 100 MG capsule   Oral   Take 100 mg by mouth every morning.          . cloNIDine (CATAPRES) 0.2 MG tablet   Oral   Take 0.2 mg by mouth at bedtime.         Marland Kitchen loratadine (CLARITIN) 10 MG tablet   Oral   Take 1 tablet (10 mg total) by mouth at bedtime.   30 tablet   3   . methylphenidate (CONCERTA) 54 MG CR tablet   Oral   Take 54 mg by mouth every morning.         . montelukast (SINGULAIR) 10 MG tablet   Oral   Take 10 mg by mouth at bedtime.         Marland Kitchen OLANZapine (ZYPREXA) 10 MG tablet   Oral   Take 10 mg by mouth at bedtime.         Marland Kitchen  omeprazole (PRILOSEC) 40 MG capsule   Oral   Take 40 mg by mouth daily.         . sertraline (ZOLOFT) 50 MG tablet   Oral   Take 50 mg by mouth daily.         Marland Kitchen albuterol (PROVENTIL HFA;VENTOLIN HFA) 108 (90 BASE) MCG/ACT inhaler   Inhalation   Inhale 2 puffs into the lungs every 6 (six) hours as needed. For shortness of breath          . albuterol (PROVENTIL) (2.5 MG/3ML) 0.083% nebulizer solution   Nebulization   Take 2.5 mg by nebulization every 6 (six) hours as needed. For shortness of breath         . diphenhydrAMINE (BENADRYL) 25 mg capsule   Oral   Take 25 mg by mouth every 6 (six) hours as needed for allergies (Allergic  Reaction).         Marland Kitchen EPINEPHrine (EPIPEN) 0.3 mg/0.3 mL DEVI   Intramuscular   Inject 0.3 mLs (0.3 mg total) into the muscle once.   1 Device   2   . HYDROcodone-acetaminophen (NORCO/VICODIN) 5-325 MG per tablet   Oral   Take 1 tablet by mouth every 4 (four) hours as needed for pain.          Marland Kitchen ondansetron (ZOFRAN ODT) 4 MG disintegrating tablet      12m ODT q6 hours prn nausea/vomit   12 tablet   0    BP 133/81  Pulse 93  Temp(Src) 98.6 F (37 C) (Oral)  Resp 18  Ht 5' 8"  (1.727 m)  Wt 193 lb (87.544 kg)  BMI 29.35 kg/m2  SpO2 98% Physical Exam  Constitutional: He is oriented to person, place, and time. He appears well-developed.  HENT:  Head: Normocephalic.  Eyes: Conjunctivae and EOM are normal. No scleral icterus.  Neck: Neck supple. No thyromegaly present.  Cardiovascular: Normal rate and regular rhythm.  Exam reveals no gallop and no friction rub.   No murmur heard. Pulmonary/Chest: No stridor. He has no wheezes. He has no rales. He exhibits no tenderness.  Abdominal: He exhibits no distension. There is tenderness. There is no rebound.  Musculoskeletal: Normal range of motion. He exhibits no edema.  Lymphadenopathy:    He has no cervical adenopathy.  Neurological: He is oriented to person, place, and time. Coordination normal.  Skin: No rash noted. No erythema.  Psychiatric: He has a normal mood and affect. His behavior is normal.    ED Course  Procedures (including critical care time) Labs Reviewed  CBC WITH DIFFERENTIAL - Abnormal; Notable for the following:    MCHC 36.1 (*)    All other components within normal limits  BASIC METABOLIC PANEL - Abnormal; Notable for the following:    Glucose, Bld 118 (*)    Calcium 10.6 (*)    All other components within normal limits  URINALYSIS, ROUTINE W REFLEX MICROSCOPIC  HEPATIC FUNCTION PANEL   Dg Abd Acute W/chest  12/12/2012   *RADIOLOGY REPORT*  Clinical Data: Right flank pain.  ACUTE ABDOMEN SERIES  (ABDOMEN 2 VIEW & CHEST 1 VIEW)  Comparison: 11/11/2012 chest x-ray.  Findings: The bowel gas pattern is normal.  There is no evidence of free intraperitoneal air.  No suspicious radio-opaque calculi or other significant radiographic abnormality is seen. Heart size and mediastinal contours are within normal limits.  Both lungs are clear.  IMPRESSION: No acute findings.   Original Report Authenticated By: KRolm Baptise M.D.  1. Vomiting     MDM    [name], MD 12/12/12 [phone]

## 2012-12-12 NOTE — ED Notes (Signed)
Family updated that dispo home should be soon.

## 2012-12-12 NOTE — ED Notes (Signed)
Pt states right side pain began last night. Vomited x 1 at 0200. Pt texting on phone and looking for mother to provide me with answers during assessment. Pain is described as aching. Pt denies nausea at this time, only pain.

## 2012-12-12 NOTE — ED Notes (Signed)
Informed family that I would get an update from the doctor concerning a plan.

## 2012-12-14 ENCOUNTER — Telehealth: Payer: Self-pay | Admitting: *Deleted

## 2012-12-14 NOTE — Telephone Encounter (Signed)
Mom states that he is having right side pain and swelling. Stated that he went to ED on Sunday 12/12/2012 and they stated he had a virus. She says pain is still there and needs appointment. Returned call and appointment given for Thursday at [place]. Mom stated they would be here.

## 2012-12-15 ENCOUNTER — Telehealth: Payer: Self-pay | Admitting: Gastroenterology

## 2012-12-15 NOTE — Telephone Encounter (Signed)
Please call pt. His stomach Bx shows gastritis.    TAKE OMEPRAZOLE 30 MINUTES PRIOR TO YOUR FIRST MEAL DAILY.  STRICTLY FOLLOW A LOW FAT DIET.   FOLLOW UP OCT 2014 E30 DX: ABD PAIN WITH [facility].Marland Kitchen

## 2012-12-16 ENCOUNTER — Encounter: Payer: Self-pay | Admitting: Pediatrics

## 2012-12-16 ENCOUNTER — Ambulatory Visit (INDEPENDENT_AMBULATORY_CARE_PROVIDER_SITE_OTHER): Payer: Medicaid Other | Admitting: Pediatrics

## 2012-12-16 VITALS — Temp 98.2°F | Wt 189.5 lb

## 2012-12-16 DIAGNOSIS — R1011 Right upper quadrant pain: Secondary | ICD-10-CM

## 2012-12-16 NOTE — Progress Notes (Signed)
Patient ID: Wayne Avila, male   DOB: 07/22/1990, 22 y.o.   MRN: [id number]  Subjective:     Patient ID: Wayne Avila, male   DOB: [birth year], 22 y.o.   MRN: [id number]  HPI: Pt is here with mom. He was seen in the ER a few days ago for R sided abdominal pain. Xrays were normal. See ED notes. The pt has long standing GI issues. A recent endoscopy showed chronic gastritis. He should be on PPI regularly.   The pt has had several visits to the ER and office for abdominal pain and emesis. He also has almost monthly visits for various pains and anxiety/ depression. He has mild to moderate cognitive delays. He sees [facility] and is currently on Concerta 54, Strattera, Clonidine and also on Zoloft and Olanzepine. His weight is up again after a drop at last visit 4 m ago. He had stopped the zyprexa/ olanzepine during that time. He has had an ER visit for "seizure like activity" that was found to be false and attention seeking recently. The pt denies smoking, alcohol or other drug use. Urine drug screen was negative last visit. He has had some high risk sexual behavior in the past. Currently is expecting a child with a woman whom he is not in a stable relationship with. Today mom states that the baby was lost. A few months ago there was another woman who came to his house and claimed to be pregnant by him. She moved in and caused the pt much stress. STD w/u was drawn but not processed by the lab, last visit. At this time the pt is in a better mood and denies any SI/ HI. He does not work. He stays at home and is thinking of signing up for online classes. Mom says he is talking to strangers online and some of them have come to his house. He doesn't drive. Mom sometime stakes him to meet people.  There is a h/o asthma that has been stable. He takes Singulair, Claritin and uses albuterol prn, infrequently. He used to be a smoker but has quit months ago.    ROS:  Apart from the symptoms reviewed above, there are no other  symptoms referable to all systems reviewed.   Physical Examination  Pulse 94, temperature 98.7 F (37.1 C), temperature source Temporal, weight 190 lb (86.183 kg). General: Alert, but very sleepy, NAD, quiet, slow mentation, but oriented. History is unclear from the pt, but mom fills in what she knows. LUNGS: CTA B CV: RRR without Murmurs ABD: Soft, no tenderness, +BS, No HSM  Dg Chest 2 View  11/11/2012   *RADIOLOGY REPORT*  Clinical Data: Chest pain and hematemesis.  CHEST - 2 VIEW  Comparison: 08/11/2012.  Findings: Normal sized heart.  Clear lungs.  Normal appearing bones.  IMPRESSION: Normal examination.   Original Report Authenticated By: Claudie Revering, M.D.    No results found for this or any previous visit (from the past 48 hour(s)).  Assessment:   Abdominal pain: resolved today. Psychosomatic?  Chronic issues: Asthma, Dev delays, Depression/ anxiety/ mood disorder, H/o high risk sexual behavior.  Plan:   I encouraged the pt to find suitable daily activities or a simple job or volunteer work.  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  Discussed dietary measures to help with GER. Mom states he is non compliant with diet. Continue f/u with [facility]. RTC as scheduled soon.

## 2012-12-16 NOTE — Telephone Encounter (Signed)
Cc PCP 

## 2012-12-16 NOTE — Telephone Encounter (Signed)
Called and informed pt's mom, Temeshia.

## 2012-12-16 NOTE — Telephone Encounter (Signed)
LMOM to call.

## 2012-12-20 ENCOUNTER — Other Ambulatory Visit: Payer: Self-pay | Admitting: Pediatrics

## 2012-12-21 NOTE — Telephone Encounter (Signed)
Reminder in epic °

## 2012-12-23 ENCOUNTER — Other Ambulatory Visit: Payer: Self-pay | Admitting: Pediatrics

## 2013-01-27 ENCOUNTER — Other Ambulatory Visit: Payer: Self-pay | Admitting: Pediatrics

## 2013-02-01 ENCOUNTER — Ambulatory Visit: Payer: Medicaid Other | Admitting: Family Medicine

## 2013-02-24 ENCOUNTER — Ambulatory Visit: Payer: Medicaid Other | Admitting: Family Medicine

## 2013-03-03 ENCOUNTER — Other Ambulatory Visit: Payer: Self-pay | Admitting: Pediatrics

## 2013-04-06 ENCOUNTER — Other Ambulatory Visit: Payer: Self-pay | Admitting: Pediatrics

## 2013-04-14 ENCOUNTER — Other Ambulatory Visit: Payer: Self-pay

## 2013-04-20 ENCOUNTER — Ambulatory Visit (INDEPENDENT_AMBULATORY_CARE_PROVIDER_SITE_OTHER): Payer: Medicaid Other | Admitting: Gastroenterology

## 2013-04-20 ENCOUNTER — Encounter: Payer: Self-pay | Admitting: Gastroenterology

## 2013-04-20 VITALS — BP 142/89 | HR 84 | Temp 97.9°F | Wt 186.0 lb

## 2013-04-20 DIAGNOSIS — K2970 Gastritis, unspecified, without bleeding: Secondary | ICD-10-CM | POA: Insufficient documentation

## 2013-04-20 DIAGNOSIS — R112 Nausea with vomiting, unspecified: Secondary | ICD-10-CM

## 2013-04-20 MED ORDER — PANTOPRAZOLE SODIUM 40 MG PO TBEC: 40.0000 mg | DELAYED_RELEASE_TABLET | Freq: Every day | ORAL | Status: DC

## 2013-04-20 NOTE — Progress Notes (Signed)
Primary Care Physician: Graciella Freer, MD  Primary Gastroenterologist:  Barney Drain, MD   Chief Complaint  Patient presents with  . Follow-up    HPI: Wayne Avila is a 22 y.o. male here for followup. Presents with his mother. Underwent EGD in July 2014 with complaints of abdominal pain, vomiting, GERD, hematemesis. He has been on omeprazole 20 mg daily.   . Esophagogastroduodenoscopy (egd) with propofol N/A 12/07/2012    SLF:The mucosa of the esophagus appeared normal Non-erosive gastritis (inflammation) was found in the gastric antrum; multiple biopsies The duodenal mucosa showed no abnormalities in the bulb and second portion of the duodenum  .           Patient states he vomits within 30 minutes or less of taking his omeprazole. He believes this is the cause of his vomiting. He denies any further abdominal pain. Appetite has been good. Denies any constipation, diarrhea, melena, rectal bleeding. States he consumes a little bit of alcohol. Denies drugs. Stays out all night and sleeps during the day.  Current Outpatient Prescriptions  Medication Sig Dispense Refill  . albuterol (PROVENTIL HFA;VENTOLIN HFA) 108 (90 BASE) MCG/ACT inhaler Inhale 2 puffs into the lungs every 6 (six) hours as needed. For shortness of breath       . albuterol (PROVENTIL) (2.5 MG/3ML) 0.083% nebulizer solution Take 2.5 mg by nebulization every 6 (six) hours as needed. For shortness of breath      . atomoxetine (STRATTERA) 100 MG capsule Take 100 mg by mouth every morning.       . cloNIDine (CATAPRES) 0.2 MG tablet Take 0.2 mg by mouth at bedtime.      Marland Kitchen EPINEPHrine (EPIPEN) 0.3 mg/0.3 mL DEVI Inject 0.3 mLs (0.3 mg total) into the muscle once.  1 Device  2  . loratadine (CLARITIN) 10 MG tablet Take 1 tablet (10 mg total) by mouth at bedtime.  30 tablet  3  . methylphenidate (CONCERTA) 54 MG CR tablet Take 54 mg by mouth every morning.      . montelukast (SINGULAIR) 10 MG tablet Take 10 mg by mouth at bedtime.       Marland Kitchen OLANZapine (ZYPREXA) 10 MG tablet Take 10 mg by mouth at bedtime.      Marland Kitchen omeprazole (PRILOSEC) 40 MG capsule Take 40 mg by mouth daily.      . sertraline (ZOLOFT) 50 MG tablet Take 50 mg by mouth daily.       No current facility-administered medications for this visit.    Allergies as of 04/20/2013 - Review Complete 04/20/2013  Allergen Reaction Noted  . Amoxicillin-pot clavulanate Nausea And Vomiting 02/28/2011  . Pineapple Swelling 04/14/2011  . Strawberry Swelling 05/08/2012  . Tomato Rash 04/14/2011    ROS:  General: Negative for anorexia, weight loss, fever, chills, fatigue, weakness. ENT: Negative for hoarseness, difficulty swallowing , nasal congestion. CV: Negative for chest pain, angina, palpitations, dyspnea on exertion, peripheral edema.  Respiratory: Negative for dyspnea at rest, dyspnea on exertion, cough, sputum, wheezing.  GI: See history of present illness. GU:  Negative for dysuria, hematuria, urinary incontinence, urinary frequency, nocturnal urination.  Endo: Negative for unusual weight change.    Physical Examination:   BP 142/89  Pulse 84  Temp(Src) 97.9 F (36.6 C) (Oral)  Wt 186 lb (84.369 kg)  General: Well-nourished, well-developed in no acute distress.  Eyes: No icterus. Mouth: Oropharyngeal mucosa moist and pink , no lesions erythema or exudate. Lungs: Clear to auscultation bilaterally.  Heart: Regular rate and  rhythm, no murmurs rubs or gallops.  Abdomen: Bowel sounds are normal, nontender, nondistended, no hepatosplenomegaly or masses, no abdominal bruits or hernia , no rebound or guarding.   Extremities: No lower extremity edema. No clubbing or deformities. Neuro: Alert and oriented x 4   Skin: Warm and dry, no jaundice.   Psych: Alert and cooperative, normal mood and affect.

## 2013-04-20 NOTE — Patient Instructions (Signed)
1. Stop omeprazole. 2. Start pantoprazole once daily before first meal of the day. 3. Call if you have ongoing vomiting.  4. You can try to wean off pantoprazole after six months of treatment if you are doing well.

## 2013-04-20 NOTE — Assessment & Plan Note (Signed)
Possibly related to his medication. History of gastritis on EGD back in July. Denies ongoing abdominal pain. Discussed antireflux measures. Prilosec pantoprazole. If continues to have vomiting he let us know.

## 2013-04-21 NOTE — Progress Notes (Signed)
cc'd to pcp 

## 2013-05-20 ENCOUNTER — Other Ambulatory Visit: Payer: Self-pay | Admitting: Pediatrics

## 2013-06-02 ENCOUNTER — Encounter (HOSPITAL_COMMUNITY): Payer: Self-pay | Admitting: Emergency Medicine

## 2013-06-02 DIAGNOSIS — R112 Nausea with vomiting, unspecified: Secondary | ICD-10-CM | POA: Insufficient documentation

## 2013-06-02 DIAGNOSIS — J45909 Unspecified asthma, uncomplicated: Secondary | ICD-10-CM | POA: Insufficient documentation

## 2013-06-02 DIAGNOSIS — R109 Unspecified abdominal pain: Secondary | ICD-10-CM | POA: Insufficient documentation

## 2013-06-02 DIAGNOSIS — R197 Diarrhea, unspecified: Secondary | ICD-10-CM | POA: Insufficient documentation

## 2013-06-02 DIAGNOSIS — Z87891 Personal history of nicotine dependence: Secondary | ICD-10-CM | POA: Insufficient documentation

## 2013-06-02 NOTE — ED Notes (Signed)
NVD all day with abd cramps

## 2013-06-03 ENCOUNTER — Emergency Department (HOSPITAL_COMMUNITY)
Admission: EM | Admit: 2013-06-03 | Discharge: 2013-06-03 | Discharge status: Against Medical Advice | Payer: Medicaid Other | Attending: Emergency Medicine | Admitting: Emergency Medicine

## 2013-06-03 NOTE — ED Notes (Signed)
Patient stated he was leaving.

## 2013-06-10 ENCOUNTER — Encounter (HOSPITAL_COMMUNITY): Payer: Self-pay | Admitting: Emergency Medicine

## 2013-06-10 ENCOUNTER — Emergency Department (HOSPITAL_COMMUNITY)
Admission: EM | Admit: 2013-06-10 | Discharge: 2013-06-10 | Disposition: A | Discharge status: Home / Self Care | Payer: Medicaid Other | Attending: Emergency Medicine | Admitting: Emergency Medicine

## 2013-06-10 DIAGNOSIS — R4189 Other symptoms and signs involving cognitive functions and awareness: Secondary | ICD-10-CM

## 2013-06-10 DIAGNOSIS — J45909 Unspecified asthma, uncomplicated: Secondary | ICD-10-CM | POA: Insufficient documentation

## 2013-06-10 DIAGNOSIS — Z79899 Other long term (current) drug therapy: Secondary | ICD-10-CM | POA: Insufficient documentation

## 2013-06-10 DIAGNOSIS — Z880 Allergy status to penicillin: Secondary | ICD-10-CM | POA: Insufficient documentation

## 2013-06-10 DIAGNOSIS — R404 Transient alteration of awareness: Secondary | ICD-10-CM

## 2013-06-10 DIAGNOSIS — F319 Bipolar disorder, unspecified: Secondary | ICD-10-CM | POA: Insufficient documentation

## 2013-06-10 DIAGNOSIS — F909 Attention-deficit hyperactivity disorder, unspecified type: Secondary | ICD-10-CM | POA: Insufficient documentation

## 2013-06-10 DIAGNOSIS — R4182 Altered mental status, unspecified: Secondary | ICD-10-CM | POA: Insufficient documentation

## 2013-06-10 DIAGNOSIS — K219 Gastro-esophageal reflux disease without esophagitis: Secondary | ICD-10-CM | POA: Insufficient documentation

## 2013-06-10 DIAGNOSIS — Z87891 Personal history of nicotine dependence: Secondary | ICD-10-CM | POA: Insufficient documentation

## 2013-06-10 DIAGNOSIS — F411 Generalized anxiety disorder: Secondary | ICD-10-CM | POA: Insufficient documentation

## 2013-06-10 DIAGNOSIS — R6250 Unspecified lack of expected normal physiological development in childhood: Secondary | ICD-10-CM | POA: Insufficient documentation

## 2013-06-10 LAB — CBC
HCT: 41.8 % (ref 39.0–52.0)
Hemoglobin: 14.7 g/dL (ref 13.0–17.0)
MCH: 28.6 pg (ref 26.0–34.0)
MCHC: 35.2 g/dL (ref 30.0–36.0)
MCV: 81.3 fL (ref 78.0–100.0)
Platelets: 277 10*3/uL (ref 150–400)
RBC: 5.14 MIL/uL (ref 4.22–5.81)
RDW: 12.6 % (ref 11.5–15.5)
WBC: 7 10*3/uL (ref 4.0–10.5)

## 2013-06-10 LAB — BASIC METABOLIC PANEL
BUN: 13 mg/dL (ref 6–23)
CO2: 26 mEq/L (ref 19–32)
Calcium: 9.7 mg/dL (ref 8.4–10.5)
Chloride: 103 mEq/L (ref 96–112)
Creatinine, Ser: 1.06 mg/dL (ref 0.50–1.35)
GFR calc Af Amer: >90 mL/min (ref >90)
GFR calc non Af Amer: >90 mL/min (ref >90)
Glucose, Bld: 84 mg/dL (ref 70–99)
Potassium: 4 mEq/L (ref 3.7–5.3)
Sodium: 139 mEq/L (ref 137–147)

## 2013-06-10 LAB — URINALYSIS, ROUTINE W REFLEX MICROSCOPIC
Bilirubin Urine: NEGATIVE
Glucose, UA: NEGATIVE mg/dL
Hgb urine dipstick: NEGATIVE
Ketones, ur: NEGATIVE mg/dL
Leukocytes, UA: NEGATIVE
Nitrite: NEGATIVE
Protein, ur: NEGATIVE mg/dL
Specific Gravity, Urine: >1.03 — ABNORMAL HIGH (ref 1.005–1.030)
Urobilinogen, UA: 1 mg/dL (ref 0.0–1.0)
pH: 6 (ref 5.0–8.0)

## 2013-06-10 LAB — RAPID URINE DRUG SCREEN, HOSP PERFORMED
Amphetamines: NOT DETECTED
Barbiturates: NOT DETECTED
Benzodiazepines: NOT DETECTED
Cocaine: NOT DETECTED
Opiates: NOT DETECTED
Tetrahydrocannabinol: NOT DETECTED

## 2013-06-10 NOTE — ED Notes (Signed)
EMS reports pt was found in his mothers car unresponsive.  EMS reports pt became responsive with ammonia inhalant.  EMs says pt reports was very upset with his mother and passed out.

## 2013-06-10 NOTE — ED Provider Notes (Signed)
CSN: [id number]     Arrival date & time 06/10/13  1736 History   This chart was scribed for Wayne Norrie, MD by [name], ED Scribe. This patient was seen in room [facility] and the patient's care was started at 9:53 PM.     Chief Complaint  Patient presents with  . Seizures   Level 5 Caveat- AMS  The history is provided by the patient and a parent. No language interpreter was used.   HPI Comments: Wayne Avila is a 23 y.o. male brought in by ambulance, who presents to the Emergency Department complaining of a possible seizure. Mother was in the drugstore and the pt was in the car with his grandmother and his girlfriend who was asleep. Grandmother noted the pt to have a changed breathing habit and appeared to stiffen up. Grandmother came and got the mother who noted that the pt was stiff with "his eyes rolled up in his head". The episode seemed to last 5 to 10 minutes. Grandmother denied any jerking or twitching motions. EMS dropped the pt off in the ED waiting room and mother states that the pt was able to sit in a chair. She states that the pt began to talk around 1.5 hours into his wait and was at his baseline after 3 hours. The pt has a h/o possible one prior seizure 10 months ago. He was evaluated at [facility] and was told "there wasn't anything wrong with him". He is currently f/u with a therapist for mild bipolar and ADHD. He was seen by his therapist afterwards and was told to come to [facility] ED to be evaluated due to his dissatisfaction with his treatment at [facility]. Mother states that he did, but nothing came of it. Mother states that he has staring spells when he is emotionally upset that have been occuring since he was a child. The episodes have worsened as he has gotten older and now occur twice a week. She denies any prior EEG work ups. She states that pt was evaluated by a neurologist as a child but required no further f/u. Mother reports a family h/o seizures with his first cousin on  his father's side.  Mother denies smoking or alcohol use. She states that he takes his medications regularly as prescribed.   Pt states that when he gets upset, he feels like something bad will happen. He had gotten upset with his mother today prior to falling asleep but doesn't remember what happened after that.   PCP Dr Maretta Los  Past Medical History  Diagnosis Date  . Asthma   . ADHD (attention deficit hyperactivity disorder)   . Bipolar 1 disorder   . GERD (gastroesophageal reflux disease)   . Depression   . Anxiety   . Mental developmental delay 11/15/2012  . PONV (postoperative nausea and vomiting)    Past Surgical History  Procedure Laterality Date  . Tonsillectomy    . Adenoidectomy    . Esophagogastroduodenoscopy (egd) with propofol N/A 12/07/2012    SLF:The mucosa of the esophagus appeared normal Non-erosive gastritis (inflammation) was found in the gastric antrum; multiple biopsies The duodenal mucosa showed no abnormalities in the bulb and second portion of the duodenum  . Esophageal biopsy N/A 12/07/2012    Procedure: GASTRIC BIOPSIES;  Surgeon: [name], MD;  Location: [facility];  Service: Endoscopy;  Laterality: N/A;   Family History  Problem Relation Age of Onset  . Asthma Mother   . Ulcers Mother   .  Colon cancer Neg Hx   . Liver disease Neg Hx    History  Substance Use Topics  . Smoking status: Former Smoker -- 1.00 packs/day for 2 years    Types: Cigarettes    Quit date: 10/06/2012  . Smokeless tobacco: Never Used  . Alcohol Use: No  lives with mother  on disability for bipolar and ADHD  Review of Systems  Unable to perform ROS: Mental status change    Allergies  Amoxicillin-pot clavulanate; Omeprazole; Pineapple; Strawberry; and Tomato  Home Medications   Current Outpatient Rx  Name  Route  Sig  Dispense  Refill  . albuterol (PROVENTIL HFA;VENTOLIN HFA) 108 (90 BASE) MCG/ACT inhaler   Inhalation   Inhale 2 puffs into the lungs every 6 (six)  hours as needed. For shortness of breath          . albuterol (PROVENTIL) (2.5 MG/3ML) 0.083% nebulizer solution   Nebulization   Take 2.5 mg by nebulization every 6 (six) hours as needed. For shortness of breath         . atomoxetine (STRATTERA) 100 MG capsule   Oral   Take 100 mg by mouth every morning.          . cloNIDine (CATAPRES) 0.2 MG tablet   Oral   Take 0.2 mg by mouth at bedtime.         Marland Kitchen loratadine (CLARITIN) 10 MG tablet   Oral   Take 10 mg by mouth at bedtime.         . methylphenidate (CONCERTA) 54 MG CR tablet   Oral   Take 54 mg by mouth every morning.         . montelukast (SINGULAIR) 10 MG tablet   Oral   Take 10 mg by mouth at bedtime.         Marland Kitchen OLANZapine (ZYPREXA) 15 MG tablet   Oral   Take 15 mg by mouth at bedtime.         . pantoprazole (PROTONIX) 40 MG tablet   Oral   Take 1 tablet (40 mg total) by mouth daily.   30 tablet   5   . sertraline (ZOLOFT) 50 MG tablet   Oral   Take 50 mg by mouth daily.         Marland Kitchen EPINEPHrine (EPIPEN) 0.3 mg/0.3 mL DEVI   Intramuscular   Inject 0.3 mLs (0.3 mg total) into the muscle once.   1 Device   2    Triage Vitals: BP 135/89  Pulse 94  Temp(Src) 98.6 F (37 C) (Oral)  Resp 18  Ht 5' 10"  (1.778 m)  Wt 200 lb (90.719 kg)  BMI 28.70 kg/m2  SpO2 95%  Vital signs normal    Physical Exam  Nursing note and vitals reviewed. Constitutional: He appears well-developed and well-nourished.  Non-toxic appearance. He does not appear ill. No distress.  HENT:  Head: Normocephalic and atraumatic.  Left Ear: External ear normal.  Nose: Nose normal. No mucosal edema or rhinorrhea.  Mouth/Throat: Oropharynx is clear and moist and mucous membranes are normal. No dental abscesses or uvula swelling.  Eyes: Conjunctivae and EOM are normal. Pupils are equal, round, and reactive to light.  Neck: Normal range of motion and full passive range of motion without pain. Neck supple.  Cardiovascular:  Normal rate, regular rhythm and normal heart sounds.  Exam reveals no gallop and no friction rub.   No murmur heard. Pulmonary/Chest: Effort normal and breath sounds normal. No respiratory  distress. He has no wheezes. He has no rhonchi. He has no rales. He exhibits no tenderness and no crepitus.  Abdominal: Soft. Normal appearance and bowel sounds are normal. He exhibits no distension. There is no tenderness. There is no rebound and no guarding.  Musculoskeletal: Normal range of motion. He exhibits no edema and no tenderness.  Moves all extremities well.   Neurological: He is alert. He has normal strength. No cranial nerve deficit.  Lethargic but arousable   Skin: Skin is warm, dry and intact. No rash noted. No erythema. No pallor.  Psychiatric: He has a normal mood and affect. His speech is normal and behavior is normal. His mood appears not anxious.    ED Course  Procedures (including critical care time)  DIAGNOSTIC STUDIES: Oxygen Saturation is 95% on RA, adequate by my interpretation.    COORDINATION OF CARE: 10:04 PM- Advised mother that the pt is stable and that no further testing is needed. All testing thus far is normal. Discussed discharge plan which includes f/u with neurology referral with mother and mother agreed to plan.   Results for orders placed during the hospital encounter of 06/10/13  URINALYSIS, ROUTINE W REFLEX MICROSCOPIC      Result Value Range   Color, Urine YELLOW  YELLOW   APPearance CLEAR  CLEAR   Specific Gravity, Urine >1.030 (*) 1.005 - 1.030   pH 6.0  5.0 - 8.0   Glucose, UA NEGATIVE  NEGATIVE mg/dL   Hgb urine dipstick NEGATIVE  NEGATIVE   Bilirubin Urine NEGATIVE  NEGATIVE   Ketones, ur NEGATIVE  NEGATIVE mg/dL   Protein, ur NEGATIVE  NEGATIVE mg/dL   Urobilinogen, UA 1.0  0.0 - 1.0 mg/dL   Nitrite NEGATIVE  NEGATIVE   Leukocytes, UA NEGATIVE  NEGATIVE  URINE RAPID DRUG SCREEN (HOSP PERFORMED)      Result Value Range   Opiates NONE DETECTED  NONE  DETECTED   Cocaine NONE DETECTED  NONE DETECTED   Benzodiazepines NONE DETECTED  NONE DETECTED   Amphetamines NONE DETECTED  NONE DETECTED   Tetrahydrocannabinol NONE DETECTED  NONE DETECTED   Barbiturates NONE DETECTED  NONE DETECTED  CBC      Result Value Range   WBC 7.0  4.0 - 10.5 K/uL   RBC 5.14  4.22 - 5.81 MIL/uL   Hemoglobin 14.7  13.0 - 17.0 g/dL   HCT 41.8  39.0 - 52.0 %   MCV 81.3  78.0 - 100.0 fL   MCH 28.6  26.0 - 34.0 pg   MCHC 35.2  30.0 - 36.0 g/dL   RDW 12.6  11.5 - 15.5 %   Platelets 277  150 - 400 K/uL  BASIC METABOLIC PANEL      Result Value Range   Sodium 139  137 - 147 mEq/L   Potassium 4.0  3.7 - 5.3 mEq/L   Chloride 103  96 - 112 mEq/L   CO2 26  19 - 32 mEq/L   Glucose, Bld 84  70 - 99 mg/dL   BUN 13  6 - 23 mg/dL   Creatinine, Ser 1.06  0.50 - 1.35 mg/dL   Calcium 9.7  8.4 - 10.5 mg/dL   GFR calc non Af Amer >90  >90 mL/min   GFR calc Af Amer >90  >90 mL/min   Laboratory interpretation all normal    EKG Interpretation   None       MDM   1. Unresponsive episode    Plan discharge  Rolland Porter, MD, FACEP   I personally performed the services described in this documentation, which was scribed in my presence. The recorded information has been reviewed and considered.  Rolland Porter, MD, Abram Sander    Wayne Norrie, MD 06/10/13 [phone]

## 2013-06-10 NOTE — Discharge Instructions (Signed)
Call Dr Freddie Apley office to get an appointment to discuss his staring spells, he may want to do an EEG (brain wave test) or MR of his brain to further evaluate these episodes. Return if he seems worse.

## 2013-06-10 NOTE — ED Notes (Signed)
Pt reports got into an argument in the car with his mother and pt says that's all he remembers.  Mother said she thinks pt may have had a seizure.  EMS reports pt became responsive with ammonia inhalant upon their arrival.  Pt presently denies any pain or discomfort.  Pt alert and oriented.

## 2013-06-10 NOTE — Progress Notes (Signed)
REVIEWED. EGD JUL 2014 GASTRITIS

## 2013-06-14 ENCOUNTER — Encounter: Payer: Self-pay | Admitting: Family Medicine

## 2013-06-14 ENCOUNTER — Ambulatory Visit (INDEPENDENT_AMBULATORY_CARE_PROVIDER_SITE_OTHER): Payer: Medicaid Other | Admitting: Family Medicine

## 2013-06-14 VITALS — BP 138/80 | HR 106 | Temp 98.4°F | Resp 20 | Ht 65.5 in | Wt 183.2 lb

## 2013-06-14 DIAGNOSIS — R569 Unspecified convulsions: Secondary | ICD-10-CM

## 2013-06-14 NOTE — Patient Instructions (Signed)
Seizure, Adult A seizure means there is unusual activity in the brain. A seizure can cause changes in attention or behavior. Seizures often cause shaking (convulsions). Seizures often last from 30 seconds to 2 minutes. HOME CARE   If you are given medicines, take them exactly as told by your doctor.  Keep all doctor visits as told.  Do not swim or drive until your doctor says it is okay.  Teach others what to do if you have a seizure. They should:  Lay you on the ground.  Put a cushion under your head.  Loosen any tight clothing around your neck.  Turn you on your side.  Stay with you until you get better. GET HELP RIGHT AWAY IF:   The seizure lasts longer than 2 to 5 minutes.  The seizure is very bad.  The person does not wake up after the seizure.  The person's attention or behavior changes. Drive the person to the emergency room or call your local emergency services (911 in [place] MAKE SURE YOU:   Understand these instructions.  Will watch your condition.  Will get help right away if you are not doing well or get worse. Document Released: 11/12/2007 Document Revised: 08/18/2011 Document Reviewed: 05/14/2011 [facility] Patient Information 2014 [place].

## 2013-06-15 NOTE — Progress Notes (Signed)
Subjective:    Patient ID: Wayne Avila, male    DOB: 1990-07-30, 23 y.o.   MRN: [id number]  HPI Pt here for ED f/u. He was seen in the ED last week after having an episode concerning for a seizure.   Per ED note: Wayne Avila is a 23 y.o. male brought in by ambulance, who presents to the Emergency Department complaining of a possible seizure. Mother was in the drugstore and the pt was in the car with his grandmother and his girlfriend who was asleep. Grandmother noted the pt to have a changed breathing habit and appeared to stiffen up. Grandmother came and got the mother who noted that the pt was stiff with "his eyes rolled up in his head". The episode seemed to last 5 to 10 minutes. Grandmother denied any jerking or twitching motions. EMS dropped the pt off in the ED waiting room and mother states that the pt was able to sit in a chair. She states that the pt began to talk around 1.5 hours into his wait and was at his baseline after 3 hours. The pt has a h/o possible one prior seizure 10 months ago. He was evaluated at [facility] and was told "there wasn't anything wrong with him". He is currently f/u with a therapist for mild bipolar and ADHD. He was seen by his therapist afterwards and was told to come to [facility] ED to be evaluated due to his dissatisfaction with his treatment at [facility]. Mother states that he did, but nothing came of it. Mother states that he has staring spells when he is emotionally upset that have been occuring since he was a child. The episodes have worsened as he has gotten older and now occur twice a week. She denies any prior EEG work ups. She states that pt was evaluated by a neurologist as a child but required no further f/u. Mother reports a family h/o seizures with his first cousin on his father's side. Mother denies smoking or alcohol use. She states that he takes his medications regularly as prescribed.  Pt states that when he gets upset, he feels like something bad will  happen. He had gotten upset with his mother today prior to falling asleep but doesn't remember what happened after that.   He has a long history of questionable seizures as a child - mom describes them as staring spells but says she was told they could possibly have been seizures. She says no tests have been done to investigate this nor does he see neurology. He does follow with psych for depression/anxiety/mood disorder. He also has a h/o developmental delay and high risk sexual behavior. He has a h/o asthma but that has been stable for quite some time.   Today he feels well and denies any concerns. Mom is frustrated and would like to find out if he has a seizure problem or not.   Review of Systems A 12 point review of systems is negative except as per hpi.      Objective:   Physical Exam  Nursing note and vitals reviewed. Constitutional: He is oriented to person, place, and time. He  appears well-developed and well-nourished.  HENT:  Right Ear: External ear normal.  Left Ear: External ear normal.  Nose: Nose normal.  Mouth/Throat: Oropharynx is clear and moist. No oropharyngeal exudate.  Eyes: Conjunctivae are normal. Pupils are equal, round, and reactive to light.  Neck: Normal range of motion. Neck supple. No thyromegaly present.  Cardiovascular:  Normal rate, regular rhythm and normal heart sounds.   Pulmonary/Chest: Effort normal and breath sounds normal.  Abdominal: Soft. Bowel sounds are normal.  no distension. There is no tenderness. There is no rebound.  Lymphadenopathy:    He has no cervical adenopathy.  Neurological: He is alert and oriented to person, place, and time. He has normal reflexes. cranial nerves II-XII grossly intact. Normal visual fields and finger nose test. Normal heel toe walk and rhomberg. He did require extra explanation for neur exam but with his dd i think it was appropriate for pt and not a new finding Skin: Skin is warm and dry.He has no concerning moles  or skin lesions Psychiatric: He has a normal mood and affect. His behavior is normal.        Assessment & Plan:  questionale seizure disorder - have referred to neuro. No driving/operating heavy machinery until eval.  rtc for cpe within next couple mos

## 2013-06-29 ENCOUNTER — Encounter: Payer: Self-pay | Admitting: Diagnostic Neuroimaging

## 2013-06-29 ENCOUNTER — Ambulatory Visit (INDEPENDENT_AMBULATORY_CARE_PROVIDER_SITE_OTHER): Payer: Medicaid Other | Admitting: Diagnostic Neuroimaging

## 2013-06-29 VITALS — BP 140/83 | HR 96 | Temp 97.5°F | Ht 67.0 in | Wt 181.0 lb

## 2013-06-29 DIAGNOSIS — R569 Unspecified convulsions: Secondary | ICD-10-CM

## 2013-06-29 NOTE — Progress Notes (Signed)
[facility]  PATIENT: Wayne Avila DOB: 1991/05/17  REFERRING CLINICIAN: wood HISTORY FROM: patient and mother REASON FOR VISIT: new consult   HISTORICAL  CHIEF COMPLAINT:  Chief Complaint  Patient presents with  . Seizures    HISTORY OF PRESENT ILLNESS:   23 year old male here for evaluation of seizures.  Birth/developmental history: Patient was born full-term ([redacted] weeks gestational age, 8 pounds weight, normal delivery without complication) with normal development until kindergarten. He talked with a few words starting at 5 or 6 months and started walking around 10 and half months. He had frequent ear infections with subsequent mild hearing loss. In kindergarten he had some difficulty and was held back. During his second cardiac kindergarten he was then diagnosed with hearing loss and possible ADHD. I age in her 23 years old he was diagnosed with bipolar disorder. He had some difficulty to school and in sixth grade through 12th grade attended some special education classes as well as traditional classes. He was able to graduate high school with a diploma as well as make the on a roll several times.  HPI: February 2014 patient had first episode of possible seizure when he was at home, in the middle of an argument, traumatic event when a girl at his home claiming that she had no place to go and needed to move in. Apparently she and the patient had gone out on a date one time previously. There is a dramatic scene, and the patient's family had to call the police to have her leave. Bilateral waiting for the police, patient began to stare, then lose consciousness, have convulsions for several minutes. He was incontinent of urine. Apparently she stopped breathing. His eyes were closed and he had stiffness in his muscles. This was witnessed by patient's mother. Patient was taken by a months to [facility] and diagnosed with an apparent stress reaction. After coming home  patient was noted to be spitting up blood. He was taken to [facility] next day and diagnosed with possible gastric ulcer.  06/10/13, patient was in a car with his grandmother and girlfriend, when the grandmother noticed that his breathing change in he. Next if not. Apparently he is eyes rolled back and he lost consciousness. Patient's mother was inside of the store at this time. Earlier in the day patient had gotten into a very heated argument and is very upset. When patient's mother came back to the car, she called 911. Patient was taken to the hospital for evaluation. Patient was discharged with recommendation for close neurologic followup.  In the past patient has never had any other episodes of loss of consciousness. He has had multiple episodes of "staring spells" starting at age 89 years old, continuing throughout his life. Some of these episodes occur when he is upset. Some of these episodes occur without any trigger. They have been occurring at least once per month. Some of these episodes are able to be terminated by tapping patient on the shoulder. Other episodes cannot be terminated by tapping the patient shoulder.  Patient is continuing to have psychiatry/psychology followup at [facility]  Review of prior hospital records indicate history of being bullied at school, prior suicidal thoughts and emotional trauma.   REVIEW OF SYSTEMS: Full 14 system review of systems performed and notable only for hearing loss wheezing blood in stool feeling hot increased as allergy runny nose consistently depression anxiety sleep seizure passing out insomnia sleepiness memory loss snoring.  ALLERGIES: Allergies  Allergen Reactions  . Amoxicillin-Pot Clavulanate Nausea And Vomiting  . Omeprazole Nausea And Vomiting  . Pineapple Swelling  . Strawberry Swelling  . Tomato Rash    HOME MEDICATIONS: Outpatient Prescriptions Prior to Visit  Medication Sig Dispense Refill  . albuterol  (PROVENTIL HFA;VENTOLIN HFA) 108 (90 BASE) MCG/ACT inhaler Inhale 2 puffs into the lungs every 6 (six) hours as needed. For shortness of breath       . albuterol (PROVENTIL) (2.5 MG/3ML) 0.083% nebulizer solution Take 2.5 mg by nebulization every 6 (six) hours as needed. For shortness of breath      . atomoxetine (STRATTERA) 100 MG capsule Take 100 mg by mouth every morning.       . cloNIDine (CATAPRES) 0.2 MG tablet Take 0.2 mg by mouth at bedtime.      Marland Kitchen EPINEPHrine (EPIPEN) 0.3 mg/0.3 mL DEVI Inject 0.3 mLs (0.3 mg total) into the muscle once.  1 Device  2  . loratadine (CLARITIN) 10 MG tablet Take 10 mg by mouth at bedtime.      . methylphenidate (CONCERTA) 54 MG CR tablet Take 54 mg by mouth every morning.      Marland Kitchen OLANZapine (ZYPREXA) 15 MG tablet Take 20 mg by mouth at bedtime.       . pantoprazole (PROTONIX) 40 MG tablet Take 1 tablet (40 mg total) by mouth daily.  30 tablet  5  . sertraline (ZOLOFT) 50 MG tablet Take 50 mg by mouth daily.      . montelukast (SINGULAIR) 10 MG tablet Take 10 mg by mouth at bedtime.       No facility-administered medications prior to visit.    PAST MEDICAL HISTORY: Past Medical History  Diagnosis Date  . Asthma   . ADHD (attention deficit hyperactivity disorder)   . Bipolar 1 disorder   . GERD (gastroesophageal reflux disease)   . Depression   . Anxiety   . Mental developmental delay 11/15/2012  . PONV (postoperative nausea and vomiting)   . Hearing loss     PAST SURGICAL HISTORY: Past Surgical History  Procedure Laterality Date  . Tonsillectomy    . Adenoidectomy    . Esophagogastroduodenoscopy (egd) with propofol N/A 12/07/2012    SLF:The mucosa of the esophagus appeared normal Non-erosive gastritis (inflammation) was found in the gastric antrum; multiple biopsies The duodenal mucosa showed no abnormalities in the bulb and second portion of the duodenum  . Esophageal biopsy N/A 12/07/2012    Procedure: GASTRIC BIOPSIES;  Surgeon: [name],  MD;  Location: [facility];  Service: Endoscopy;  Laterality: N/A;    FAMILY HISTORY: Family History  Problem Relation Age of Onset  . Asthma Mother   . Ulcers Mother   . Bipolar disorder Mother   . Colon cancer Neg Hx   . Liver disease Neg Hx   . ADD / ADHD Father   . Diabetes Maternal Grandmother   . Diabetes Maternal Grandfather     SOCIAL HISTORY:  History   Social History  . Marital Status: Single    Spouse Name: N/A    Number of Children: 0  . Years of Education: 12th   Occupational History  .      not working   Social History Main Topics  . Smoking status: Former Smoker -- 1.00 packs/day for 2 years    Types: Cigarettes    Quit date: 10/06/2012  . Smokeless tobacco: Never Used  . Alcohol Use: No  . Drug Use: No  .  Sexual Activity: Not on file   Other Topics Concern  . Not on file   Social History Narrative   Patient lives at home with girlfriend and family.   Caffeine Use: occasionally     PHYSICAL EXAM  Filed Vitals:   06/29/13 0957  BP: 140/83  Pulse: 96  Temp: 97.5 F (36.4 C)  TempSrc: Oral  Height: 5' 7"  (1.702 m)  Weight: 181 lb (82.101 kg)    Not recorded    Body mass index is 28.34 kg/(m^2).  GENERAL EXAM: Patient is in no distress; SLEEPY, PSYCHOMOTOR SLOWING, SOFT VOICE, QUIET RESPONSES, POOR EYE CONTACT.   CARDIOVASCULAR: Regular rate and rhythm, no murmurs, no carotid bruits  NEUROLOGIC: MENTAL STATUS: awake, alert, oriented to person, place and time, recent and remote memory intact, normal attention and concentration, language fluent, comprehension intact, naming intact, fund of knowledge appropriate CRANIAL NERVE: no papilledema on fundoscopic exam, pupils equal and reactive to light, visual fields full to confrontation, extraocular muscles intact, no nystagmus, facial sensation and strength symmetric, hearing intact, palate elevates symmetrically, uvula midline, shoulder shrug symmetric, tongue midline. MOTOR: normal bulk and  tone, full strength in the BUE, BLE SENSORY: normal and symmetric to light touch, temperature, vibration COORDINATION: finger-nose-finger, fine finger movements normal REFLEXES: deep tendon reflexes present and symmetric GAIT/STATION: narrow based gait; able to walk on toes, heels and tandem; romberg is negative    DIAGNOSTIC DATA (LABS, IMAGING, TESTING) - I reviewed patient records, labs, notes, testing and imaging myself where available.  Lab Results  Component Value Date   WBC 7.0 06/10/2013   HGB 14.7 06/10/2013   HCT 41.8 06/10/2013   MCV 81.3 06/10/2013   PLT 277 06/10/2013      Component Value Date/Time   NA 139 06/10/2013 1822   K 4.0 06/10/2013 1822   CL 103 06/10/2013 1822   CO2 26 06/10/2013 1822   GLUCOSE 84 06/10/2013 1822   BUN 13 06/10/2013 1822   CREATININE 1.06 06/10/2013 1822   CALCIUM 9.7 06/10/2013 1822   PROT 7.9 12/12/2012 1527   ALBUMIN 4.3 12/12/2012 1527   AST 22 12/12/2012 1527   ALT 15 12/12/2012 1527   ALKPHOS 70 12/12/2012 1527   BILITOT 0.3 12/12/2012 1527   GFRNONAA >90 06/10/2013 1822   GFRAA >90 06/10/2013 1822   No results found for this basename: CHOL, HDL, LDLCALC, LDLDIRECT, TRIG, CHOLHDL   No results found for this basename: HGBA1C   No results found for this basename: VITAMINB12   No results found for this basename: TSH    I reviewed images myself and agree with interpretation.   10/20/10 CT head - normal     ASSESSMENT AND PLAN  23 y.o. year old male here with possible convulsive seizure x 2. History of staring spells, convulsions, postictal amnesia, incontinence favor diagnosis of epilepsy seizure. History of emotional upset triggering factor raises possibility of nonepileptic spells.  PLAN: - MRI , EEG - may consider depakote or lamitcal if testing is abnormal - may need to consider VEEG for definitive dx if above is unrevealing - no driving or operating heavy machinery (pt does not have driver's license); caution with heights, swimming and other seizure  precautions   Orders Placed This Encounter  Procedures  . MR Brain W Wo Contrast  . EEG adult   Return in about 3 months (around 09/27/2013) for with Charlott Holler or Ava Deguire.  I reviewed images, labs, notes, records myself. I summarized findings and reviewed with patient, for  this high risk condition (seizure/convulsions) requiring high complexity decision making.   Penni Bombard, MD 9/41/2904, 75:33 AM Certified in Neurology, Neurophysiology and Neuroimaging  [facility] [address] [place], [place] [address] [phone]

## 2013-06-29 NOTE — Patient Instructions (Signed)
Seizure, Adult A seizure is abnormal electrical activity in the brain. Seizures usually last from 30 seconds to 2 minutes. There are various types of seizures. Before a seizure, you may have a warning sensation (aura) that a seizure is about to occur. An aura may include the following symptoms:   Fear or anxiety.  Nausea.  Feeling like the room is spinning (vertigo).  Vision changes, such as seeing flashing lights or spots. Common symptoms during a seizure include:  A change in attention or behavior (altered mental status).  Convulsions with rhythmic jerking movements.  Drooling.  Rapid eye movements.  Grunting.  Loss of bladder and bowel control.  Bitter taste in the mouth.  Tongue biting. After a seizure, you may feel confused and sleepy. You may also have an injury resulting from convulsions during the seizure. HOME CARE INSTRUCTIONS   If you are given medicines, take them exactly as prescribed by your health care provider.  Keep all follow-up appointments as directed by your health care provider.  Do not swim or drive or engage in risky activity during which a seizure could cause further injury to you or others until your health care provider says it is OK.  Get adequate rest.  Teach friends and family what to do if you have a seizure. They should:  Lay you on the ground to prevent a fall.  Put a cushion under your head.  Loosen any tight clothing around your neck.  Turn you on your side. If vomiting occurs, this helps keep your airway clear.  Stay with you until you recover.  Know whether or not you need emergency care. SEEK IMMEDIATE MEDICAL CARE IF:  The seizure lasts longer than 5 minutes.  The seizure is severe or you do not wake up immediately after the seizure.  You have an altered mental status after the seizure.  You are having more frequent or worsening seizures. Someone should drive you to the emergency department or call local emergency  services (911 in [place] MAKE SURE YOU:  Understand these instructions.  Will watch your condition.  Will get help right away if you are not doing well or get worse. Document Released: 05/23/2000 Document Revised: 03/16/2013 Document Reviewed: 01/05/2013 [facility] Patient Information 2014 [place].

## 2013-07-01 ENCOUNTER — Ambulatory Visit: Payer: Medicaid Other | Admitting: Neurology

## 2013-07-07 ENCOUNTER — Ambulatory Visit (INDEPENDENT_AMBULATORY_CARE_PROVIDER_SITE_OTHER): Payer: Medicaid Other | Admitting: Radiology

## 2013-07-07 ENCOUNTER — Telehealth: Payer: Self-pay | Admitting: *Deleted

## 2013-07-07 DIAGNOSIS — R569 Unspecified convulsions: Secondary | ICD-10-CM

## 2013-07-07 NOTE — Telephone Encounter (Signed)
Pt and mother hear for EEG.  Pt to have MRI and is needing xanax for anxiety, claustrophobia.  Ok per Dr. Leta Baptist for xanax pkt.  Placed up front.

## 2013-07-13 ENCOUNTER — Ambulatory Visit
Admission: RE | Admit: 2013-07-13 | Discharge: 2013-07-13 | Disposition: A | Discharge status: Home / Self Care | Payer: Medicaid Other | Source: Ambulatory Visit | Attending: Diagnostic Neuroimaging | Admitting: Diagnostic Neuroimaging

## 2013-07-13 DIAGNOSIS — R569 Unspecified convulsions: Secondary | ICD-10-CM

## 2013-07-13 MED ORDER — GADOBENATE DIMEGLUMINE 529 MG/ML IV SOLN
17.0000 mL | Freq: Once | INTRAVENOUS | Status: AC | PRN
  Administered 2013-07-13: 17 mL via INTRAVENOUS

## 2013-07-21 NOTE — Procedures (Signed)
   [facility]  EEG (ELECTROENCEPHALOGRAM) REPORT   STUDY DATE: 07/21/13 PATIENT NAME: Wayne Avila DOB: 30-Dec-1990 MRN: [id number]  ORDERING CLINICIAN: Andrey Spearman, MD   TECHNOLOGIST: Towana Badger  TECHNIQUE: Electroencephalogram was recorded utilizing standard 10-20 system of lead placement and reformatted into average and bipolar montages.  RECORDING TIME: 28 minutes ACTIVATION: photic stimulation  CLINICAL INFORMATION: 23 year old male with convulsions x 2 and staring spells.  FINDINGS: Background rhythms of 10-11 hertz and 20-30 microvolts. No focal, lateralizing, epileptiform activity or seizures are seen. Patient recorded in the awake and drowsy state. EKG channel shows 96 beats per minute.  IMPRESSION:  Normal EEG in the awake state.   INTERPRETING PHYSICIAN:  Penni Bombard, MD Certified in Neurology, Neurophysiology and Neuroimaging  [facility] [address], [place], [place] [address] [phone]

## 2013-07-24 ENCOUNTER — Other Ambulatory Visit: Payer: Self-pay | Admitting: Pediatrics

## 2013-08-02 ENCOUNTER — Emergency Department (HOSPITAL_COMMUNITY)
Admission: EM | Admit: 2013-08-02 | Discharge: 2013-08-02 | Disposition: A | Discharge status: Home / Self Care | Payer: Medicaid Other | Attending: Emergency Medicine | Admitting: Emergency Medicine

## 2013-08-02 ENCOUNTER — Telehealth: Payer: Self-pay | Admitting: Diagnostic Neuroimaging

## 2013-08-02 ENCOUNTER — Encounter (HOSPITAL_COMMUNITY): Payer: Self-pay | Admitting: Emergency Medicine

## 2013-08-02 DIAGNOSIS — R7309 Other abnormal glucose: Secondary | ICD-10-CM | POA: Insufficient documentation

## 2013-08-02 DIAGNOSIS — Z79899 Other long term (current) drug therapy: Secondary | ICD-10-CM | POA: Insufficient documentation

## 2013-08-02 DIAGNOSIS — R739 Hyperglycemia, unspecified: Secondary | ICD-10-CM

## 2013-08-02 DIAGNOSIS — J45909 Unspecified asthma, uncomplicated: Secondary | ICD-10-CM | POA: Insufficient documentation

## 2013-08-02 DIAGNOSIS — H9190 Unspecified hearing loss, unspecified ear: Secondary | ICD-10-CM | POA: Insufficient documentation

## 2013-08-02 DIAGNOSIS — F3130 Bipolar disorder, current episode depressed, mild or moderate severity, unspecified: Secondary | ICD-10-CM | POA: Insufficient documentation

## 2013-08-02 DIAGNOSIS — R5383 Other fatigue: Secondary | ICD-10-CM

## 2013-08-02 DIAGNOSIS — F909 Attention-deficit hyperactivity disorder, unspecified type: Secondary | ICD-10-CM | POA: Insufficient documentation

## 2013-08-02 DIAGNOSIS — K219 Gastro-esophageal reflux disease without esophagitis: Secondary | ICD-10-CM | POA: Insufficient documentation

## 2013-08-02 DIAGNOSIS — F411 Generalized anxiety disorder: Secondary | ICD-10-CM | POA: Insufficient documentation

## 2013-08-02 DIAGNOSIS — Z880 Allergy status to penicillin: Secondary | ICD-10-CM | POA: Insufficient documentation

## 2013-08-02 DIAGNOSIS — Z87891 Personal history of nicotine dependence: Secondary | ICD-10-CM | POA: Insufficient documentation

## 2013-08-02 LAB — BASIC METABOLIC PANEL
BUN: 9 mg/dL (ref 6–23)
CO2: 28 mEq/L (ref 19–32)
Calcium: 9.2 mg/dL (ref 8.4–10.5)
Chloride: 102 mEq/L (ref 96–112)
Creatinine, Ser: 0.95 mg/dL (ref 0.50–1.35)
GFR calc Af Amer: >90 mL/min (ref >90)
GFR calc non Af Amer: >90 mL/min (ref >90)
Glucose, Bld: 87 mg/dL (ref 70–99)
Potassium: 3.9 mEq/L (ref 3.7–5.3)
Sodium: 142 mEq/L (ref 137–147)

## 2013-08-02 LAB — CBC WITH DIFFERENTIAL/PLATELET
Basophils Absolute: 0 10*3/uL (ref 0.0–0.1)
Basophils Relative: 0 % (ref 0–1)
Eosinophils Absolute: 0.1 10*3/uL (ref 0.0–0.7)
Eosinophils Relative: 2 % (ref 0–5)
HCT: 39.4 % (ref 39.0–52.0)
Hemoglobin: 13.8 g/dL (ref 13.0–17.0)
Lymphocytes Relative: 27 % (ref 12–46)
Lymphs Abs: 1.9 10*3/uL (ref 0.7–4.0)
MCH: 28.8 pg (ref 26.0–34.0)
MCHC: 35 g/dL (ref 30.0–36.0)
MCV: 82.3 fL (ref 78.0–100.0)
Monocytes Absolute: 0.6 10*3/uL (ref 0.1–1.0)
Monocytes Relative: 8 % (ref 3–12)
Neutro Abs: 4.4 10*3/uL (ref 1.7–7.7)
Neutrophils Relative %: 63 % (ref 43–77)
Platelets: 243 10*3/uL (ref 150–400)
RBC: 4.79 MIL/uL (ref 4.22–5.81)
RDW: 12.9 % (ref 11.5–15.5)
WBC: 7.1 10*3/uL (ref 4.0–10.5)

## 2013-08-02 LAB — CBG MONITORING, ED: Glucose-Capillary: 120 mg/dL — ABNORMAL HIGH (ref 70–99)

## 2013-08-02 NOTE — ED Provider Notes (Addendum)
CSN: [id number]     Arrival date & time 08/02/13  0137 History   First MD Initiated Contact with Patient 08/02/13 [phone]     Chief Complaint  Patient presents with  . Hyperglycemia     (Consider location/radiation/quality/duration/timing/severity/associated sxs/prior Treatment) HPI Comments: 23 yo male with mental delay, gastritis presents with fatigue and high glucose reading on families glucometer PTA.  Pt felt well earlier today but family said increased fatigue/ urination this evening, glucose checked in 500s, no known DM hx.   Glucose 120 in ED.  No recent seizures, last one 1 month ago.  No fevers or neck stiffness.    Patient is a 23 y.o. male presenting with hyperglycemia. The history is provided by the patient.  Hyperglycemia Associated symptoms: fatigue, increased thirst and polyuria   Associated symptoms: no abdominal pain, no chest pain, no dysuria, no fever, no shortness of breath and no vomiting     Past Medical History  Diagnosis Date  . Asthma   . ADHD (attention deficit hyperactivity disorder)   . Bipolar 1 disorder   . GERD (gastroesophageal reflux disease)   . Depression   . Anxiety   . Mental developmental delay 11/15/2012  . PONV (postoperative nausea and vomiting)   . Hearing loss    Past Surgical History  Procedure Laterality Date  . Tonsillectomy    . Adenoidectomy    . Esophagogastroduodenoscopy (egd) with propofol N/A 12/07/2012    SLF:The mucosa of the esophagus appeared normal Non-erosive gastritis (inflammation) was found in the gastric antrum; multiple biopsies The duodenal mucosa showed no abnormalities in the bulb and second portion of the duodenum  . Esophageal biopsy N/A 12/07/2012    Procedure: GASTRIC BIOPSIES;  Surgeon: [name], MD;  Location: [facility];  Service: Endoscopy;  Laterality: N/A;   Family History  Problem Relation Age of Onset  . Asthma Mother   . Ulcers Mother   . Bipolar disorder Mother   . Colon cancer Neg Hx   . Liver  disease Neg Hx   . ADD / ADHD Father   . Diabetes Maternal Grandmother   . Diabetes Maternal Grandfather    History  Substance Use Topics  . Smoking status: Former Smoker -- 1.00 packs/day for 2 years    Types: Cigarettes    Quit date: 10/06/2012  . Smokeless tobacco: Never Used  . Alcohol Use: No    Review of Systems  Constitutional: Positive for fatigue. Negative for fever and chills.  HENT: Negative for congestion.   Eyes: Negative for visual disturbance.  Respiratory: Negative for shortness of breath.   Cardiovascular: Negative for chest pain.  Gastrointestinal: Negative for vomiting and abdominal pain.  Endocrine: Positive for polydipsia and polyuria.  Genitourinary: Negative for dysuria and flank pain.  Musculoskeletal: Negative for back pain, neck pain and neck stiffness.  Skin: Negative for rash.  Neurological: Negative for seizures, light-headedness and headaches.      Allergies  Amoxicillin-pot clavulanate; Omeprazole; Pineapple; Strawberry; and Tomato  Home Medications   Current Outpatient Rx  Name  Route  Sig  Dispense  Refill  . albuterol (PROVENTIL) (2.5 MG/3ML) 0.083% nebulizer solution   Nebulization   Take 2.5 mg by nebulization every 6 (six) hours as needed. For shortness of breath         . atomoxetine (STRATTERA) 100 MG capsule   Oral   Take 100 mg by mouth every morning.          . cloNIDine (CATAPRES) 0.2  MG tablet   Oral   Take 0.2 mg by mouth at bedtime.         Marland Kitchen EPINEPHrine (EPIPEN) 0.3 mg/0.3 mL DEVI   Intramuscular   Inject 0.3 mLs (0.3 mg total) into the muscle once.   1 Device   2   . loratadine (CLARITIN) 10 MG tablet   Oral   Take 10 mg by mouth at bedtime.         . methylphenidate (CONCERTA) 54 MG CR tablet   Oral   Take 54 mg by mouth every morning.         Marland Kitchen OLANZapine (ZYPREXA) 15 MG tablet   Oral   Take 20 mg by mouth at bedtime.          . pantoprazole (PROTONIX) 40 MG tablet   Oral   Take 1 tablet  (40 mg total) by mouth daily.   30 tablet   5   . PROAIR HFA 108 (90 BASE) MCG/ACT inhaler      INHALE 2 PUFFS BY MOUTH EVERY 4 HOURS AS NEEDED FOR WHEEZING OR DRY PERSISTENT COUGH   8.5 g   0   . sertraline (ZOLOFT) 50 MG tablet   Oral   Take 50 mg by mouth daily.          BP 133/85  Pulse 68  Temp(Src) 98.6 F (37 C) (Oral)  Resp 18  Ht 5' 9"  (1.753 m)  Wt 181 lb (82.101 kg)  BMI 26.72 kg/m2  SpO2 99% Physical Exam  Nursing note and vitals reviewed. Constitutional: He is oriented to person, place, and time. He appears well-developed and well-nourished.  HENT:  Head: Normocephalic and atraumatic.  Eyes: Conjunctivae are normal. Right eye exhibits no discharge. Left eye exhibits no discharge.  Neck: Normal range of motion. Neck supple. No tracheal deviation present.  Cardiovascular: Normal rate and regular rhythm.   Pulmonary/Chest: Effort normal and breath sounds normal.  Abdominal: Soft. He exhibits no distension. There is no tenderness. There is no guarding.  Musculoskeletal: He exhibits no edema.  Neurological: He is alert and oriented to person, place, and time. GCS eye subscore is 3. GCS verbal subscore is 5. GCS motor subscore is 6.  General fatigue, AO3 5+ strength in UE and LE with f/e at major joints. Sensation to palpation intact in UE and LE. CNs 2-12 grossly intact.  EOMFI.  PERRL.   Finger nose and coordination intact bilateral.   Visual fields intact to finger testing. No meningismus  Skin: Skin is warm. No rash noted.  Psychiatric: He has a normal mood and affect.    ED Course  Procedures (including critical care time) Labs Review Labs Reviewed  CBG MONITORING, ED - Abnormal; Notable for the following:    Glucose-Capillary 120 (*)    All other components within normal limits  CBC WITH DIFFERENTIAL  BASIC METABOLIC PANEL  CBG MONITORING, ED   Imaging Review No results found.  EKG Interpretation   None       MDM   Final diagnoses:   Fatigue  Hyperglycemia   Labs unremarkable. Possible early type II DM. No seizures in ED.  Possibly post ictal as cause for fatigue, no seizures witnessed.  Walker pt prior to dc. Mother with pt. No signs of meningitis or encephalitis. Close fup stressed. Pt currently being evaluated for staring spells/ seizures, not on meds currently, has fup.   Results and differential diagnosis were discussed with the patient. Close follow up outpatient was  discussed, patient comfortable with the plan.       Mariea Clonts, MD 08/02/13 3007  Mariea Clonts, MD 08/02/13 [phone]

## 2013-08-02 NOTE — ED Notes (Signed)
Patient c/o not feeling well and took his sugar and it was high.

## 2013-08-02 NOTE — Discharge Instructions (Signed)
See your doctor for recheck tomorrow.  If you were given medicines take as directed.  If you are on coumadin or contraceptives realize their levels and effectiveness is altered by many different medicines.  If you have any reaction (rash, tongues swelling, other) to the medicines stop taking and see a physician.   Please follow up as directed and return to the ER or see a physician for new or worsening symptoms.  Thank you.  Diabetes, Type 2, Am I At Risk? Diabetes is a lasting (chronic) disease. In type 2 diabetes, the pancreas does not make enough insulin, and the body does not respond normally to the insulin that is made. This type of diabetes was also previously called adult onset diabetes. About 90% of all those who have diabetes have type 2. It usually occurs after the age of 34, but can occur at any age.  People develop type 2 diabetes because they do not use insulin properly. Eventually, the pancreas cannot make enough insulin for the body's needs. Over time, the amount of glucose (sugar) in the blood increases. RISK FACTORS  Overweight  the more weight you have, the more resistant your cells become to insulin.  Family history  you are more likely to get diabetes if a parent or sibling has diabetes.  Race certain races get diabetes more.  African Americans.  American Indians.  Asian Americans.  Hispanics.  Pacific Islander.  Inactive exercise helps control weight and helps your cells be more sensitive to insulin.  Gestational diabetes  some women develop diabetes while they are pregnant. This goes away when they deliver. However, they are 50-60% more likely to develop type 2 diabetes at a later time.  Having a baby over 9 pounds  a sign that you may have had gestational diabetes.  Age the risk of diabetes goes up as you get older, especially after age 57.  High blood pressure (hypertension). SYMPTOMS Many people have no signs or symptoms. Symptoms can be so mild that you  might not even notice them. Some of these signs are:  Increased thirst.  Increased hunger.  Tiredness (fatigue).  Increased urination, especially at night.  Weight loss.  Blurred vision.  Sores that do not heal. WHO SHOULD BE TESTED?  Anyone 28 years or older, especially if overweight, should consider getting tested.  If you are younger than 59, overweight, and have one or more of the risk factors, you should consider getting tested. DIAGNOSIS  Fasting blood glucose (FBS). Usually, 2 are done.  FBS 101-125 mg/dl is considered pre-diabetes.  FBS 126 mg/dl or greater is considered diabetes.  2 hour Oral Glucose Tolerance Test (OGTT). This test is preformed by first having you not eat or drink for several hours. You are then given something sweet to drink and your blood glucose is measured fasting, at one hour and 2 hours. This test tells how well you are able to handle sugars or carbohydrates.  Fasting: 60-100 mg/dl.  1 hour: less than 200 mg/dl.  2 hours: less than 140 mg/dl.  A1c A1c is a blood glucose test that gives and average of your blood glucose over 3 months. It is the accepted method to use to diagnose diabetes.  A1c 5.7-6.4% is considered pre-diabetes.  A1c 6.5% or greater is considered diabetes. WHAT DOES IT MEAN TO HAVE PRE-DIABETES? Pre-diabetes means you are at risk for getting type 2 diabetes. Your blood glucose is higher than normal, but not yet high enough to diagnose diabetes. The good  news is, if you have pre-diabetes you can reduce the risk of getting diabetes and even return to normal blood glucose levels. With modest weight loss and moderate physical activity, you can delay or prevent type 2 diabetes.  PREVENTION You cannot do anything about race, age or family history, but you can lower your chances of getting diabetes. You can:   Exercise regularly and be active.  Reduce fat and calorie intake.  Make wise food choices as much as you  can.  Reduce your intake of salt and alcohol.  Maintain a reasonable weight.  Keep blood pressure in an acceptable range. Take medication if needed.  Not smoke.  Maintain an acceptable cholesterol level (HDL, LDL, Triglycerides). Take medication if needed. DOING MY PART: GETTING STARTED Making big changes in your life is hard, especially if you are faced with more than one change. You can make it easier by taking these steps:  Make a plan to change behavior.  Decide exactly what you will do and when you will do it.  Plan what you need to get ready.  Think about what might prevent you from reaching your goals.  Find family and friends who will support and encourage you.  Decide how you will reward yourself when you do what you have planned.  Your doctor, dietitian, or counselor can help you make a plan. HERE ARE SOME OF THE AREAS YOU MAY WISH TO CHANGE TO REDUCE YOUR RISK OF DIABETES. If you are overweight or obese, choose sensible ways to get in shape. Even small amounts of weight loss, like 5-10 pounds, can help reduce the effects of insulin resistance and help blood glucose control. Diet  Avoid crash diets. Instead, eat less of the foods you usually have. Limit the amount of fat you eat.  Increase your physical activity. Aim for at least 30 minutes of exercise most days of the week.  Set a reasonable weight-loss goal, such as losing 1 pound a week. Aim for a long-term goal of losing 5-7% of your total body weight.  Make wise food choices most of the time.  What you eat has a big impact on your health. By making wise food choices, you can help control your body weight, blood pressure, and cholesterol.  Take a hard look at the serving sizes of the foods you eat. Reduce serving sizes of meat, desserts, and foods high in fat. Increase your intake of fruits and vegetables.  Limit your fat intake to about 25% of your total calories. For example, if your food choices add up to  about 2,000 calories a day, try to eat no more than 56 grams of fat. Your caregiver or a dietitian can help you figure out how much fat to have. You can check food labels for fat content too.  You may also want to reduce the number of calories you have each day.  Keep a food log. Write down what you eat, how much you eat, and anything else that helps keep you on track.  When you meet your goal, reward yourself with a nonfood item or activity. Exercise  Be physically active every day.  Keep and exercise log. Write down what exercise you did, for how long, and anything else that keeps you on track.  Regular exercise (like brisk walking) tackles several risk factors at once. It helps you lose weight, it keeps your cholesterol and blood pressure under control, and it helps your body use insulin. People who are physically active for  30 minutes a day, 5 days a week, reduced their risk of type 2 diabetes. If you are not very active, you should start slowly at first. Talk with your caregiver first about what kinds of exercise would be safe for you. Make a plan to increase your activity level with the goal of being active for at least 30 minutes a day, most days of the week.  Choose activities you enjoy. Here are some ways to work extra activity into your daily routine:  Take the stairs rather than an elevator or escalator.  Park at the far end of the lot and walk.  Get off the bus a few stops early and walk the rest of the way.  Walk or bicycle instead of drive whenever you can. Medications Some people need medication to help control their blood pressure or cholesterol levels. If you do, take your medicines as directed. Ask your caregiver whether there are any medicines you can take to prevent type 2 diabetes. Document Released: 05/29/2003 Document Revised: 08/18/2011 Document Reviewed: 02/21/2009 [facility] Patient Information 2014 [place].

## 2013-08-02 NOTE — ED Notes (Signed)
Gave patient diet coke to drink. Patient answering questions appropriately and drinking drink at this time.

## 2013-08-02 NOTE — Telephone Encounter (Signed)
Spoke with patient's mother, scheduled hospital f/u appt /confirmed and informed per Dr Leta Baptist that she should have patient f/u with primary for hyperglycemia as well. She verbalized understanding and said that they had tried to contact their office with no success.

## 2013-08-02 NOTE — ED Notes (Signed)
Ambulated patient in hallway. Patient ambulated with no assistance.

## 2013-08-02 NOTE — Telephone Encounter (Signed)
Mother called and stated that they went to ER this morning and while there the doctors saw something "a little off" and they feel like he may have had a seizure in his sleep.  They told them that they would need to follow-up with our office.  PT does have an appt scheduled in April, but feel like maybe he needs to be seen sooner.  Please call to advise.  Thank you.

## 2013-08-03 ENCOUNTER — Encounter: Payer: Self-pay | Admitting: Family Medicine

## 2013-08-03 ENCOUNTER — Ambulatory Visit (INDEPENDENT_AMBULATORY_CARE_PROVIDER_SITE_OTHER): Payer: Medicaid Other | Admitting: Diagnostic Neuroimaging

## 2013-08-03 ENCOUNTER — Encounter: Payer: Self-pay | Admitting: Diagnostic Neuroimaging

## 2013-08-03 ENCOUNTER — Ambulatory Visit (INDEPENDENT_AMBULATORY_CARE_PROVIDER_SITE_OTHER): Payer: Medicaid Other | Admitting: Family Medicine

## 2013-08-03 VITALS — BP 142/87 | HR 85 | Temp 98.7°F | Ht 67.0 in | Wt 172.0 lb

## 2013-08-03 VITALS — BP 118/80 | HR 85 | Temp 97.4°F | Resp 18 | Ht 65.5 in | Wt 172.9 lb

## 2013-08-03 DIAGNOSIS — R569 Unspecified convulsions: Secondary | ICD-10-CM

## 2013-08-03 DIAGNOSIS — R7309 Other abnormal glucose: Secondary | ICD-10-CM

## 2013-08-03 DIAGNOSIS — R739 Hyperglycemia, unspecified: Secondary | ICD-10-CM

## 2013-08-03 DIAGNOSIS — F05 Delirium due to known physiological condition: Secondary | ICD-10-CM

## 2013-08-03 LAB — GLUCOSE, POCT (MANUAL RESULT ENTRY): POC Glucose: 84 mg/dl (ref 70–99)

## 2013-08-03 NOTE — Progress Notes (Signed)
[facility]  PATIENT: Wayne Avila DOB: Nov 10, 1990  REFERRING CLINICIAN:  HISTORY FROM: patient and mother REASON FOR VISIT: follow up (urgent work in)   [place]:  Chief Complaint  Patient presents with  . Follow-up    hospital, sz    HISTORY OF PRESENT ILLNESS:   UPDATE 08/03/13: Since last visit, had MRI and EEG (both normal). No further witnessed convulsions or seizures. On 08/01/13, patient went to sleep in the evening. The only thing he ate that day was 2 egg/cheese biscuits. Around 2am, he work up not feeling well. He was "shaky" and confused. Family check his blood sugar at home (mother and grandmother are diabetic) and his reading was in the 28's. Patient was taken to the emergency room and evaluated. Blood sugar in the emergency room was 120. Since that time patient was continued to be fatigued and sleepy. Patient has been having increased thirst as well as weight loss over the past one year. No official diagnosis of diabetes.  PRIOR HPI (06/29/13): 23 year old male here for evaluation of seizures.  Birth/developmental history: Patient was born full-term ([redacted] weeks gestational age, 8 pounds weight, normal delivery without complication) with normal development until kindergarten. He talked with a few words starting at 5 or 6 months and started walking around 10 and half months. He had frequent ear infections with subsequent mild hearing loss. In kindergarten he had some difficulty and was held back. During his second cardiac kindergarten he was then diagnosed with hearing loss and possible ADHD. I age in her 23 years old he was diagnosed with bipolar disorder. He had some difficulty to school and in sixth grade through 12th grade attended some special education classes as well as traditional classes. He was able to graduate high school with a diploma as well as make the on a roll several times.  HPI: February 2014 patient had first episode  of possible seizure when he was at home, in the middle of an argument, dramatic event when a girl at his home claiming that she had no place to go and needed to move in. Apparently she and the patient had gone out on a date one time previously. There was a dramatic scene, and the patient's family had to call the police to have her leave. While waiting for the police, patient began to stare, then lose consciousness, have convulsions for several minutes. He was incontinent of urine. Apparently she stopped breathing. His eyes were closed and he had stiffness in his muscles. This was witnessed by patient's mother. Patient was taken by [facility] and diagnosed with an apparent stress reaction. After coming home patient was noted to be spitting up blood. He was taken to [facility] next day and diagnosed with possible gastric ulcer.  06/10/13, patient was in a car with his grandmother and girlfriend, when the grandmother noticed that his breathing changed. Apparently his eyes rolled back and he lost consciousness. Patient's mother was inside of the store at this time. Earlier in the day patient had gotten into a very heated argument and was very upset. When patient's mother came back to the car, she called 911. Patient was taken to the hospital for evaluation. Patient was discharged with recommendation for close neurologic followup.  In the past patient has never had any other episodes of loss of consciousness. He has had multiple episodes of "staring spells" starting at age 18 years old, continuing throughout his life. Some of these  episodes occur when he is upset. Some of these episodes occur without any trigger. They have been occurring at least once per month. Some of these episodes are able to be terminated by tapping patient on the shoulder. Other episodes cannot be terminated by tapping the patient shoulder.  Patient is continuing to have psychiatry/psychology followup at [facility].  Review of prior hospital records indicate history of being bullied at school, prior suicidal thoughts and emotional trauma.   REVIEW OF SYSTEMS: Full 14 system review of systems performed and notable only for appetite change fatigue decreased weight excessive sweating hearing loss Raynaud's insomnia sleepwalking sleep talking hyperactivity depression nervousness anxiety behavior problem seizure gynecological allergies intolerance excessive thirst.  ALLERGIES: Allergies  Allergen Reactions  . Amoxicillin-Pot Clavulanate Nausea And Vomiting  . Omeprazole Nausea And Vomiting  . Pineapple Swelling  . Strawberry Swelling  . Tomato Rash    HOME MEDICATIONS: Outpatient Prescriptions Prior to Visit  Medication Sig Dispense Refill  . albuterol (PROVENTIL) (2.5 MG/3ML) 0.083% nebulizer solution Take 2.5 mg by nebulization every 6 (six) hours as needed. For shortness of breath      . atomoxetine (STRATTERA) 100 MG capsule Take 100 mg by mouth every morning.       . cloNIDine (CATAPRES) 0.2 MG tablet Take 0.2 mg by mouth at bedtime.      Marland Kitchen EPINEPHrine (EPIPEN) 0.3 mg/0.3 mL DEVI Inject 0.3 mLs (0.3 mg total) into the muscle once.  1 Device  2  . loratadine (CLARITIN) 10 MG tablet Take 10 mg by mouth at bedtime.      . methylphenidate (CONCERTA) 54 MG CR tablet Take 54 mg by mouth every morning.      . pantoprazole (PROTONIX) 40 MG tablet Take 1 tablet (40 mg total) by mouth daily.  30 tablet  5  . PROAIR HFA 108 (90 BASE) MCG/ACT inhaler INHALE 2 PUFFS BY MOUTH EVERY 4 HOURS AS NEEDED FOR WHEEZING OR DRY PERSISTENT COUGH  8.5 g  0  . sertraline (ZOLOFT) 50 MG tablet Take 50 mg by mouth daily.      Marland Kitchen OLANZapine (ZYPREXA) 15 MG tablet Take 20 mg by mouth at bedtime.        No facility-administered medications prior to visit.    PAST MEDICAL HISTORY: Past Medical History  Diagnosis Date  . Asthma   . ADHD (attention deficit hyperactivity disorder)   . Bipolar 1 disorder   . GERD  (gastroesophageal reflux disease)   . Depression   . Anxiety   . Mental developmental delay 11/15/2012  . PONV (postoperative nausea and vomiting)   . Hearing loss     PAST SURGICAL HISTORY: Past Surgical History  Procedure Laterality Date  . Tonsillectomy    . Adenoidectomy    . Esophagogastroduodenoscopy (egd) with propofol N/A 12/07/2012    SLF:The mucosa of the esophagus appeared normal Non-erosive gastritis (inflammation) was found in the gastric antrum; multiple biopsies The duodenal mucosa showed no abnormalities in the bulb and second portion of the duodenum  . Esophageal biopsy N/A 12/07/2012    Procedure: GASTRIC BIOPSIES;  Surgeon: [name], MD;  Location: [facility];  Service: Endoscopy;  Laterality: N/A;    FAMILY HISTORY: Family History  Problem Relation Age of Onset  . Asthma Mother   . Ulcers Mother   . Bipolar disorder Mother   . Colon cancer Neg Hx   . Liver disease Neg Hx   . ADD / ADHD Father   .  Diabetes Maternal Grandmother   . Diabetes Maternal Grandfather     SOCIAL HISTORY:  History   Social History  . Marital Status: Single    Spouse Name: N/A    Number of Children: 0  . Years of Education: 12th   Occupational History  .      not working   Social History Main Topics  . Smoking status: Former Smoker -- 1.00 packs/day for 2 years    Types: Cigarettes    Quit date: 10/06/2012  . Smokeless tobacco: Never Used  . Alcohol Use: No  . Drug Use: No  . Sexual Activity: Not on file   Other Topics Concern  . Not on file   Social History Narrative   Patient lives at home with girlfriend and family.   Caffeine Use: occasionally     PHYSICAL EXAM  Filed Vitals:   08/03/13 1055  BP: 142/87  Pulse: 85  Temp: 98.7 F (37.1 C)  TempSrc: Oral  Height: 5' 7"  (1.702 m)  Weight: 172 lb (78.019 kg)    Not recorded    Body mass index is 26.93 kg/(m^2).  GENERAL EXAM: Patient is in no distress; SLEEPY, PSYCHOMOTOR SLOWING, SOFT VOICE,  QUIET RESPONSES, POOR EYE CONTACT.   CARDIOVASCULAR: Regular rate and rhythm, no murmurs, no carotid bruits  NEUROLOGIC: MENTAL STATUS: awake, alert, oriented to person, place and time, recent and remote memory intact, normal attention and concentration, language fluent, comprehension intact, naming intact, fund of knowledge appropriate CRANIAL NERVE: no papilledema on fundoscopic exam, pupils equal and reactive to light, visual fields full to confrontation, extraocular muscles intact, no nystagmus, facial sensation and strength symmetric, hearing intact, palate elevates symmetrically, uvula midline, shoulder shrug symmetric, tongue midline. MOTOR: normal bulk and tone, full strength in the BUE, BLE SENSORY: normal and symmetric to light touch, temperature, vibration COORDINATION: finger-nose-finger, fine finger movements normal REFLEXES: deep tendon reflexes present and symmetric GAIT/STATION: narrow based gait; able to walk on toes, heels and tandem; romberg is negative    DIAGNOSTIC DATA (LABS, IMAGING, TESTING) - I reviewed patient records, labs, notes, testing and imaging myself where available.  Lab Results  Component Value Date   WBC 7.1 08/02/2013   HGB 13.8 08/02/2013   HCT 39.4 08/02/2013   MCV 82.3 08/02/2013   PLT 243 08/02/2013      Component Value Date/Time   NA 142 08/02/2013 0257   K 3.9 08/02/2013 0257   CL 102 08/02/2013 0257   CO2 28 08/02/2013 0257   GLUCOSE 87 08/02/2013 0257   BUN 9 08/02/2013 0257   CREATININE 0.95 08/02/2013 0257   CALCIUM 9.2 08/02/2013 0257   PROT 7.9 12/12/2012 1527   ALBUMIN 4.3 12/12/2012 1527   AST 22 12/12/2012 1527   ALT 15 12/12/2012 1527   ALKPHOS 70 12/12/2012 1527   BILITOT 0.3 12/12/2012 1527   GFRNONAA >90 08/02/2013 0257   GFRAA >90 08/02/2013 0257   No results found for this basename: CHOL,  HDL,  LDLCALC,  LDLDIRECT,  TRIG,  CHOLHDL   No results found for this basename: HGBA1C   No results found for this basename: VITAMINB12   No  results found for this basename: TSH    I reviewed images myself and agree with interpretation.   10/20/10 CT head - normal   07/14/13 MRI brain - normal  07/21/13 EEG - normal   ASSESSMENT AND PLAN  23 y.o. year old male here with possible convulsive seizure x 2. History of staring spells,  convulsions, postictal amnesia, incontinence favor diagnosis of epileptic seizure. History of emotional upset triggering factor raises possibility of nonepileptic spells. MRI and EEG unremarkable. Now with new event of confusion, malaise and blood sugar in 500's at home, raises possibility of diabetes.  PLAN: - monitor for seizures; consider VEEG for definitive diagnosis if they continue - no driving or operating heavy machinery (pt does not have driver's license); caution with heights, swimming and other seizure precautions - follow up with PCP re: elevated blood sugar event  Return in about 2 months (around 10/01/2013).   Penni Bombard, MD 06/11/129, 43:88 AM Certified in Neurology, Neurophysiology and Neuroimaging  [facility] [address] [place], [place] [address] [phone]

## 2013-08-03 NOTE — Patient Instructions (Signed)
Monitor blood sugars.  Drink water only. No juice or sweeteners.  Eat at least 3 meals per day.  Add whole fruits/vegetables to your diet.

## 2013-08-03 NOTE — Progress Notes (Signed)
   Subjective:    Patient ID: Wayne Avila, male    DOB: 06/17/1990, 23 y.o.   MRN: [id number]  HPI Pt, who does not have a personal history of DM, checked his BG at home on his grandmothers meter and it read 600 and 595  A few minutes apart. His mom, a diabetic herself, was of course concerned and took him to the ED. At the ED his sugars were normal. He denies polyurea or polydipsia. No blurry vision or lightheadedness. He has since checked his sugar on grndmothers meter and it read 107 this morning.     Review of Systems A 12 point review of systems is negative except as per hpi.       Objective:   Physical Exam Nursing note and vitals reviewed. Constitutional: He is oriented to person, place, and time. He  appears well-developed and well-nourished. well hydrated. HENT:  Right Ear: External ear normal.  Left Ear: External ear normal.  Nose: Nose normal.  Mouth/Throat: Oropharynx is clear and moist. No oropharyngeal exudate.  Eyes: Conjunctivae are normal. Pupils are equal, round, and reactive to light.  Neck: Normal range of motion. Neck supple. No thyromegaly present.  Cardiovascular: Normal rate, regular rhythm and normal heart sounds.   Pulmonary/Chest: Effort normal and breath sounds normal.  Abdominal: Soft. Bowel sounds are normal.  no distension. There is no tenderness. There is no rebound.  Lymphadenopathy:    He has no cervical adenopathy.  Neurological: He is alert and oriented to person, place, and time. He has normal reflexes.  Skin: Skin is warm and dry.He has no concerning moles or skin lesions Psychiatric: He has a normal mood and affect. His behavior is normal.        Assessment & Plan:  Dorell was seen today for blood sugar problem.  Diagnoses and associated orders for this visit:  Hyperglycemia - POCT glucose (manual entry) - Hemoglobin A1c  I do not think this pt has DM, will r/o however with a1c and see what poct glu is here. Will f/u and treat as  appropriate.

## 2013-08-04 LAB — HEMOGLOBIN A1C
Hgb A1c MFr Bld: 5.7 % — ABNORMAL HIGH (ref <5.7)
Mean Plasma Glucose: 117 mg/dL — ABNORMAL HIGH (ref <117)

## 2013-08-05 ENCOUNTER — Telehealth: Payer: Self-pay | Admitting: *Deleted

## 2013-08-05 NOTE — Progress Notes (Signed)
See telephone encounter.

## 2013-08-05 NOTE — Telephone Encounter (Signed)
Message copied by [facility], Louis Meckel on Fri Aug 05, 2013  1:21 PM ------      Message from: Doran Heater      Created: Fri Aug 05, 2013 12:45 PM       Please let family know pt's a1c is 5.7, just barely on the border of being pre-diabetic. Id like him to avoid sugarry foods and processed foords. Recheck in 6-12 mos. ------

## 2013-08-05 NOTE — Telephone Encounter (Signed)
Mom notified, appreciative and understanding.

## 2013-08-09 ENCOUNTER — Encounter: Payer: Self-pay | Admitting: Gastroenterology

## 2013-08-09 ENCOUNTER — Ambulatory Visit (INDEPENDENT_AMBULATORY_CARE_PROVIDER_SITE_OTHER): Payer: Medicaid Other | Admitting: Gastroenterology

## 2013-08-09 VITALS — BP 135/87 | HR 75 | Temp 97.9°F | Ht 67.0 in | Wt 173.4 lb

## 2013-08-09 DIAGNOSIS — R112 Nausea with vomiting, unspecified: Secondary | ICD-10-CM

## 2013-08-09 DIAGNOSIS — R634 Abnormal weight loss: Secondary | ICD-10-CM | POA: Insufficient documentation

## 2013-08-09 DIAGNOSIS — K625 Hemorrhage of anus and rectum: Secondary | ICD-10-CM

## 2013-08-09 DIAGNOSIS — R1011 Right upper quadrant pain: Secondary | ICD-10-CM

## 2013-08-09 LAB — HEPATIC FUNCTION PANEL
ALT: <8 U/L (ref 0–53)
AST: 15 U/L (ref 0–37)
Albumin: 4.4 g/dL (ref 3.5–5.2)
Alkaline Phosphatase: 75 U/L (ref 39–117)
Bilirubin, Direct: <0.1 mg/dL (ref 0.0–0.3)
Total Bilirubin: 0.2 mg/dL (ref 0.2–1.2)
Total Protein: 6.9 g/dL (ref 6.0–8.3)

## 2013-08-09 LAB — LIPASE: Lipase: 37 U/L (ref 0–75)

## 2013-08-09 LAB — TSH: TSH: 1.996 u[IU]/mL (ref 0.350–4.500)

## 2013-08-09 NOTE — Progress Notes (Signed)
Primary Care Physician: Graciella Freer, MD  Primary Gastroenterologist:  Barney Drain, MD   Chief Complaint  Patient presents with  . Gastrophageal Reflux  . Rectal Bleeding    about a week ago    HPI: Wayne Avila is a 23 y.o. male here for followup. He was last seen in November 2014. EGD in July 2014 for complaints of abdominal pain, vomiting, GERD, hematemesis. He was found to have normal-appearing esophagus. He had nonerosive gastritis but no H. pylori. When last saw him he had complaints that he had vomiting within 30 minutes of taking his omeprazole. He states the same thing is happening with the pantoprazole. Does not take the pantoprazole because "it makes me throw up."   Off pantoprazole for two weeks. No heartburn. Vomiting improved. He had been taking the PPI by itself and other meds later. Mom reports he complains of some pain in the right side. Patient initially denies but finally confirmed. States he notes pain more with meals. He continues to loose weight. Mom feels he is eating good. Some bright red blood in stool for "a long time". Patient has not wanted to see anyone for it but finally agreed. Sometimes only passes blood. No straining. Several BMs per day. Denies rectal pain. Denies watery/loose stool. Denies melena.   Denies etoh use.    Weights: 11/2012: 192 04/2013: 186  06/2013: 183  08/03/2013: 172   Has seen neurologist recently for questionable seizures. Recent ER visit for glucose of over 500 on [facility]'s glucometer. Glucose was 120 in ER. HgbA1C recently 5.7.  Current Outpatient Prescriptions  Medication Sig Dispense Refill  . albuterol (PROVENTIL) (2.5 MG/3ML) 0.083% nebulizer solution Take 2.5 mg by nebulization every 6 (six) hours as needed. For shortness of breath      . atomoxetine (STRATTERA) 100 MG capsule Take 100 mg by mouth every morning.       . cloNIDine (CATAPRES) 0.2 MG tablet Take 0.2 mg by mouth at bedtime.      Marland Kitchen EPINEPHrine (EPIPEN) 0.3  mg/0.3 mL DEVI Inject 0.3 mLs (0.3 mg total) into the muscle once.  1 Device  2  . loratadine (CLARITIN) 10 MG tablet Take 10 mg by mouth at bedtime.      . methylphenidate (CONCERTA) 54 MG CR tablet Take 54 mg by mouth every morning.      . montelukast (SINGULAIR) 10 MG tablet Take 10 mg by mouth at bedtime.      Marland Kitchen OLANZapine (ZYPREXA) 20 MG tablet Take 20 mg by mouth at bedtime.      Marland Kitchen PROAIR HFA 108 (90 BASE) MCG/ACT inhaler INHALE 2 PUFFS BY MOUTH EVERY 4 HOURS AS NEEDED FOR WHEEZING OR DRY PERSISTENT COUGH  8.5 g  0  . sertraline (ZOLOFT) 50 MG tablet Take 50 mg by mouth daily.       No current facility-administered medications for this visit.    Allergies as of 08/09/2013 - Review Complete 08/09/2013  Allergen Reaction Noted  . Amoxicillin-pot clavulanate Nausea And Vomiting 02/28/2011  . Omeprazole Nausea And Vomiting 04/20/2013  . Pineapple Swelling 04/14/2011  . Strawberry Swelling 05/08/2012  . Tomato Rash 04/14/2011   Past Medical History  Diagnosis Date  . Asthma   . ADHD (attention deficit hyperactivity disorder)   . Bipolar 1 disorder   . GERD (gastroesophageal reflux disease)   . Depression   . Anxiety   . Mental developmental delay 11/15/2012  . PONV (postoperative nausea and vomiting)   .  Hearing loss    Past Surgical History  Procedure Laterality Date  . Tonsillectomy    . Adenoidectomy    . Esophagogastroduodenoscopy (egd) with propofol N/A 12/07/2012    SLF:The mucosa of the esophagus appeared normal Non-erosive gastritis (inflammation) was found in the gastric antrum; multiple biopsies The duodenal mucosa showed no abnormalities in the bulb and second portion of the duodenum  . Esophageal biopsy N/A 12/07/2012    Procedure: GASTRIC BIOPSIES;  Surgeon: [name], MD;  Location: [facility];  Service: Endoscopy;  Laterality: N/A;   Family History  Problem Relation Age of Onset  . Asthma Mother   . Ulcers Mother   . Bipolar disorder Mother   . Colon cancer Neg  Hx   . Liver disease Neg Hx   . ADD / ADHD Father   . Diabetes Maternal Grandmother   . Diabetes Maternal Grandfather    History   Social History  . Marital Status: Single    Spouse Name: N/A    Number of Children: 0  . Years of Education: 12th   Occupational History  .      not working   Social History Main Topics  . Smoking status: Former Smoker -- 1.00 packs/day for 2 years    Types: Cigarettes    Quit date: 10/06/2012  . Smokeless tobacco: Never Used  . Alcohol Use: No  . Drug Use: No  . Sexual Activity: None   Other Topics Concern  . None   Social History Narrative   Patient lives at home with girlfriend and family.   Caffeine Use: occasionally    ROS:  General: Negative for anorexia, weight loss, fever, chills, fatigue, weakness. ENT: Negative for hoarseness, difficulty swallowing , nasal congestion. CV: Negative for chest pain, angina, palpitations, dyspnea on exertion, peripheral edema.  Respiratory: Negative for dyspnea at rest, dyspnea on exertion, cough, sputum, wheezing.  GI: See history of present illness. GU:  Negative for dysuria, hematuria, urinary incontinence, urinary frequency, nocturnal urination.  Endo: Negative for unusual weight change.    Physical Examination:   BP 135/87  Pulse 75  Temp(Src) 97.9 F (36.6 C) (Oral)  Ht 5' 7"  (1.702 m)  Wt 173 lb 6.4 oz (78.654 kg)  BMI 27.15 kg/m2  General: Well-nourished, well-developed in no acute distress.  Eyes: No icterus. Mouth: Oropharyngeal mucosa moist and pink , no lesions erythema or exudate. Lungs: Clear to auscultation bilaterally.  Heart: Regular rate and rhythm, no murmurs rubs or gallops.  Abdomen: Bowel sounds are normal, nontender, nondistended, no hepatosplenomegaly or masses, no abdominal bruits or hernia , no rebound or guarding.   Extremities: No lower extremity edema. No clubbing or deformities. Neuro: Alert and oriented x 4   Skin: Warm and dry, no jaundice.   Psych:  Alert and cooperative, normal mood and affect.  Labs:  Lab Results  Component Value Date   WBC 7.1 08/02/2013   HGB 13.8 08/02/2013   HCT 39.4 08/02/2013   MCV 82.3 08/02/2013   PLT 243 08/02/2013   Lab Results  Component Value Date   CREATININE 0.95 08/02/2013   BUN 9 08/02/2013   NA 142 08/02/2013   K 3.9 08/02/2013   CL 102 08/02/2013   CO2 28 08/02/2013   Lab Results  Component Value Date   ALT 15 12/12/2012   AST 22 12/12/2012   ALKPHOS 70 12/12/2012   BILITOT 0.3 12/12/2012    Imaging Studies: Mr Jeri Cos ZO Contrast  08-05-2013  [facility]  NEUROIMAGING REPORT   STUDY DATE: 07/13/13 PATIENT NAME: Wayne Avila DOB: 09-30-1990 MRN: [id number]  ORDERING CLINICIAN: Andrey Spearman, MD  CLINICAL HISTORY: 24 year old male with seizures.  EXAM: MRI brain (with and without)  TECHNIQUE: MRI of the brain with and without contrast was obtained  utilizing 5 mm axial slices with T1, T2, T2 flair, SWI and diffusion  weighted views.  T1 sagittal, T2 coronal and postcontrast views in the  axial and coronal plane were obtained. CONTRAST: 60m multihance  [facility] [address](1.5 Tesla MRI)    FINDINGS:  No abnormal lesions are seen on diffusion-weighted views to suggest acute  ischemia. The cortical sulci, fissures and cisterns are normal in size and  appearance. Lateral, third and fourth ventricle are normal in size and  appearance. No extra-axial fluid collections are seen. No evidence of mass  effect or midline shift.    No abnormal lesions are seen on post contrast views.  On sagittal views  the posterior fossa, pituitary gland and corpus callosum are unremarkable.  No evidence of intracranial hemorrhage on SWI views. The orbits and their  contents, paranasal sinuses and calvarium are unremarkable.  Intracranial  flow voids are present.    07/14/2013   Normal MRI brain (with and without).   INTERPRETING PHYSICIAN:  VPenni Bombard MD Certified in  Neurology, Neurophysiology and Neuroimaging  [facility] [address] [place] [phone]

## 2013-08-09 NOTE — Assessment & Plan Note (Signed)
Complains of intermittent rectal bleeding for several months, worsened lately. Notices it every day. Denies any rectal pain. Denies constipation. Denies diarrhea. Mother is quite concerned about it. We'll plan on colonoscopy at some point in the near future but initially would need to proceed with CT scanning as outlined under weight loss.

## 2013-08-09 NOTE — Patient Instructions (Signed)
1. Please have labs and CT scan done. 2. Once results are back, we will discuss next step. You will need to eventually have a colonoscopy to further evaluate your rectal bleeding.

## 2013-08-09 NOTE — Assessment & Plan Note (Signed)
23 year old gentleman, difficult historian, who continues to have ongoing weight loss. Documented 20 pound weight loss since June of last year. Mother feels that the patient is eating adequately. There has been reported right-sided abdominal pain postprandially sometimes right upper quadrant. Patient has had intermittent vomiting which he feels is related to the PPI. Stopped his pantoprazole 2 weeks ago and states his vomiting improved. Because he is a poor historian, and history is very difficult to obtain from him given his cognitive deficits and psychiatric disease, we will pursue CT abdomen and pelvis for further evaluation. Would be concerned about gallbladder disease, IBD. Based on findings he will likely need a colonoscopy as next step. We will check LFTs, lipase. Also check thyroid status at this time given weight loss. Further recommendations to follow

## 2013-08-09 NOTE — Progress Notes (Signed)
cc'd to pcp 

## 2013-08-11 ENCOUNTER — Ambulatory Visit (HOSPITAL_COMMUNITY)
Admission: RE | Admit: 2013-08-11 | Discharge: 2013-08-11 | Disposition: A | Discharge status: Home / Self Care | Payer: Medicaid Other | Source: Ambulatory Visit | Attending: Gastroenterology | Admitting: Gastroenterology

## 2013-08-11 DIAGNOSIS — K921 Melena: Secondary | ICD-10-CM | POA: Insufficient documentation

## 2013-08-11 DIAGNOSIS — K625 Hemorrhage of anus and rectum: Secondary | ICD-10-CM

## 2013-08-11 DIAGNOSIS — R112 Nausea with vomiting, unspecified: Secondary | ICD-10-CM

## 2013-08-11 DIAGNOSIS — R634 Abnormal weight loss: Secondary | ICD-10-CM

## 2013-08-11 DIAGNOSIS — R1011 Right upper quadrant pain: Secondary | ICD-10-CM

## 2013-08-11 MED ORDER — IOHEXOL 300 MG/ML  SOLN
100.0000 mL | Freq: Once | INTRAMUSCULAR | Status: AC | PRN
  Administered 2013-08-11: 100 mL via INTRAVENOUS

## 2013-08-16 ENCOUNTER — Other Ambulatory Visit: Payer: Self-pay | Admitting: Pediatrics

## 2013-08-17 ENCOUNTER — Telehealth: Payer: Self-pay | Admitting: *Deleted

## 2013-08-17 NOTE — Telephone Encounter (Signed)
See lab and CT result notes.

## 2013-08-17 NOTE — Progress Notes (Signed)
Quick Note:  CT shows some thickening of anal wall. Likely source of rectal bleeding. Recommend TCS with Dr. [name] in OR with deep sedation (polypharmacy/psychiatric disease). ______

## 2013-08-17 NOTE — Telephone Encounter (Signed)
I called and spoke to pt's mom, Alonna Buckler. She said she is his guardian. She is calling for lab and CT results. I told her I will call when Neil Crouch, PA addresses and sends it to me.

## 2013-08-17 NOTE — Progress Notes (Signed)
Quick Note:  Labs are normal . See CT report. ______

## 2013-08-17 NOTE — Telephone Encounter (Signed)
Pt's mother called to get pt's results. Please advise [phone]

## 2013-08-18 ENCOUNTER — Other Ambulatory Visit: Payer: Self-pay | Admitting: Gastroenterology

## 2013-08-18 MED ORDER — PEG 3350-KCL-NA BICARB-NACL 420 G PO SOLR: 4000.0000 mL | ORAL | Status: DC

## 2013-08-22 ENCOUNTER — Encounter (HOSPITAL_COMMUNITY): Payer: Self-pay | Admitting: Pharmacy Technician

## 2013-08-25 ENCOUNTER — Encounter (HOSPITAL_COMMUNITY): Payer: Self-pay

## 2013-08-25 ENCOUNTER — Encounter (HOSPITAL_COMMUNITY)
Admission: RE | Admit: 2013-08-25 | Discharge: 2013-08-25 | Disposition: A | Discharge status: Home / Self Care | Payer: Medicaid Other | Source: Ambulatory Visit | Attending: Gastroenterology | Admitting: Gastroenterology

## 2013-08-25 DIAGNOSIS — Z01812 Encounter for preprocedural laboratory examination: Secondary | ICD-10-CM | POA: Insufficient documentation

## 2013-08-25 NOTE — Patient Instructions (Signed)
Colonoscopy, Care After Refer to this sheet in the next few weeks. These instructions provide you with information on caring for yourself after your procedure. Your health care provider may also give you more specific instructions. Your treatment has been planned according to current medical practices, but problems sometimes occur. Call your health care provider if you have any problems or questions after your procedure. WHAT TO EXPECT AFTER THE PROCEDURE  After your procedure, it is typical to have the following:  A small amount of blood in your stool.  Moderate amounts of gas and mild abdominal cramping or bloating. HOME CARE INSTRUCTIONS  Do not drive, operate machinery, or sign important documents for 24 hours.  You may shower and resume your regular physical activities, but move at a slower pace for the first 24 hours.  Take frequent rest periods for the first 24 hours.  Walk around or put a warm pack on your abdomen to help reduce abdominal cramping and bloating.  Drink enough fluids to keep your urine clear or pale yellow.  You may resume your normal diet as instructed by your health care provider. Avoid heavy or fried foods that are hard to digest.  Avoid drinking alcohol for 24 hours or as instructed by your health care provider.  Only take over-the-counter or prescription medicines as directed by your health care provider.  If a tissue sample (biopsy) was taken during your procedure:  Do not take aspirin or blood thinners for 7 days, or as instructed by your health care provider.  Do not drink alcohol for 7 days, or as instructed by your health care provider.  Eat soft foods for the first 24 hours. SEEK MEDICAL CARE IF: You have persistent spotting of blood in your stool 2 3 days after the procedure. SEEK IMMEDIATE MEDICAL CARE IF:  You have more than a small spotting of blood in your stool.  You pass large blood clots in your stool.  Your abdomen is swollen  (distended).  You have nausea or vomiting.  You have a fever.  You have increasing abdominal pain that is not relieved with medicine. Document Released: 01/08/2004 Document Revised: 03/16/2013 Document Reviewed: 01/31/2013 [facility] Patient Information 2014 [place]. Colonoscopy A colonoscopy is an exam to look at the entire large intestine (colon). This exam can help find problems such as tumors, polyps, inflammation, and areas of bleeding. The exam takes about 1 hour.  LET [facility] CARE PROVIDER KNOW ABOUT:   Any allergies you have.  All medicines you are taking, including vitamins, herbs, eye drops, creams, and over-the-counter medicines.  Previous problems you or members of your family have had with the use of anesthetics.  Any blood disorders you have.  Previous surgeries you have had.  Medical conditions you have. RISKS AND COMPLICATIONS  Generally, this is a safe procedure. However, as with any procedure, complications can occur. Possible complications include:  Bleeding.  Tearing or rupture of the colon wall.  Reaction to medicines given during the exam.  Infection (rare). BEFORE THE PROCEDURE   Ask your health care provider about changing or stopping your regular medicines.  You may be prescribed an oral bowel prep. This involves drinking a large amount of medicated liquid, starting the day before your procedure. The liquid will cause you to have multiple loose stools until your stool is almost clear or light green. This cleans out your colon in preparation for the procedure.  Do not eat or drink anything else once you have started the  bowel prep, unless your health care provider tells you it is safe to do so.  Arrange for someone to drive you home after the procedure. PROCEDURE   You will be given medicine to help you relax (sedative).  You will lie on your side with your knees bent.  A long, flexible tube with a light and camera on the end  (colonoscope) will be inserted through the rectum and into the colon. The camera sends video back to a computer screen as it moves through the colon. The colonoscope also releases carbon dioxide gas to inflate the colon. This helps your health care provider see the area better.  During the exam, your health care provider may take a small tissue sample (biopsy) to be examined under a microscope if any abnormalities are found.  The exam is finished when the entire colon has been viewed. AFTER THE PROCEDURE   Do not drive for 24 hours after the exam.  You may have a small amount of blood in your stool.  You may pass moderate amounts of gas and have mild abdominal cramping or bloating. This is caused by the gas used to inflate your colon during the exam.  Ask when your test results will be ready and how you will get your results. Make sure you get your test results. Document Released: 05/23/2000 Document Revised: 03/16/2013 Document Reviewed: 01/31/2013 [facility] Patient Information 2014 [place].

## 2013-08-25 NOTE — Pre-Procedure Instructions (Signed)
Procedure explained to mother and patient.  Both verbalize understanding.  All questions answered.  Instructions given for pre and post procedure care.

## 2013-08-26 LAB — CBC
HCT: 43.2 % (ref 39.0–52.0)
Hemoglobin: 14.3 g/dL (ref 13.0–17.0)
MCH: 28.4 pg (ref 26.0–34.0)
MCHC: 33.1 g/dL (ref 30.0–36.0)
MCV: 85.9 fL (ref 78.0–100.0)
Platelets: 233 10*3/uL (ref 150–400)
RBC: 5.03 MIL/uL (ref 4.22–5.81)
RDW: 13.7 % (ref 11.5–15.5)
WBC: 6 10*3/uL (ref 4.0–10.5)

## 2013-08-26 LAB — BASIC METABOLIC PANEL
BUN: 15 mg/dL (ref 6–23)
CO2: 24 mEq/L (ref 19–32)
Calcium: 9.6 mg/dL (ref 8.4–10.5)
Chloride: 102 mEq/L (ref 96–112)
Creatinine, Ser: 0.93 mg/dL (ref 0.50–1.35)
GFR calc Af Amer: >90 mL/min (ref >90)
GFR calc non Af Amer: >90 mL/min (ref >90)
Glucose, Bld: 74 mg/dL (ref 70–99)
Potassium: 4.9 mEq/L (ref 3.7–5.3)
Sodium: 141 mEq/L (ref 137–147)

## 2013-08-29 ENCOUNTER — Telehealth: Payer: Self-pay

## 2013-08-29 NOTE — Telephone Encounter (Signed)
Called and informed pt's mom.

## 2013-08-29 NOTE — Telephone Encounter (Signed)
I spoke to pt's mom. Wayne Avila, who said that the pt started the prep on time. Doculax tablets at 12:00 noon and then started the prep at 2:00 pm. He has had 3 loose stools with bright red blood in it and she describes as a lot and also a lot on tissue.   He is scheduled for his colonoscopy tomorrow with Dr. [name].  Please advise!  ( Sending pager to Dr. [name] also).

## 2013-08-29 NOTE — Progress Notes (Signed)
REVIEWED.  

## 2013-08-29 NOTE — Telephone Encounter (Signed)
Pt's mother is calling because he is doing him prep and is passing nothing but blood. Please advise

## 2013-08-29 NOTE — Telephone Encounter (Signed)
PLEASE CALL PT. HIS COLONOSCOPY WAS ORDERED FOR RECTAL BLEEDING. IF HE IS CONCERNED ABOUT THE AMOUNT OF BLOOD HE SHOULD GO TO THE NEAREST ED. HE NEEDS A COLONOSCOPY TO INVESTIGATE THE CAUSE FOR HIS RECTAL BLEEDING.

## 2013-08-30 ENCOUNTER — Encounter (HOSPITAL_COMMUNITY): Payer: Self-pay

## 2013-08-30 ENCOUNTER — Encounter (HOSPITAL_COMMUNITY)
Admission: RE | Disposition: A | Discharge status: Home / Self Care | Payer: Self-pay | Source: Ambulatory Visit | Attending: Gastroenterology

## 2013-08-30 ENCOUNTER — Encounter (HOSPITAL_COMMUNITY): Payer: Medicaid Other | Admitting: Anesthesiology

## 2013-08-30 ENCOUNTER — Ambulatory Visit (HOSPITAL_COMMUNITY): Payer: Medicaid Other | Admitting: Anesthesiology

## 2013-08-30 ENCOUNTER — Ambulatory Visit (HOSPITAL_COMMUNITY)
Admission: RE | Admit: 2013-08-30 | Discharge: 2013-08-30 | Disposition: A | Discharge status: Home / Self Care | Payer: Medicaid Other | Source: Ambulatory Visit | Attending: Gastroenterology | Admitting: Gastroenterology

## 2013-08-30 DIAGNOSIS — Z79899 Other long term (current) drug therapy: Secondary | ICD-10-CM | POA: Insufficient documentation

## 2013-08-30 DIAGNOSIS — K5289 Other specified noninfective gastroenteritis and colitis: Secondary | ICD-10-CM | POA: Insufficient documentation

## 2013-08-30 DIAGNOSIS — F909 Attention-deficit hyperactivity disorder, unspecified type: Secondary | ICD-10-CM | POA: Insufficient documentation

## 2013-08-30 DIAGNOSIS — K620 Anal polyp: Secondary | ICD-10-CM | POA: Insufficient documentation

## 2013-08-30 DIAGNOSIS — F411 Generalized anxiety disorder: Secondary | ICD-10-CM | POA: Insufficient documentation

## 2013-08-30 DIAGNOSIS — K621 Rectal polyp: Secondary | ICD-10-CM

## 2013-08-30 DIAGNOSIS — K6289 Other specified diseases of anus and rectum: Secondary | ICD-10-CM | POA: Insufficient documentation

## 2013-08-30 DIAGNOSIS — K625 Hemorrhage of anus and rectum: Secondary | ICD-10-CM

## 2013-08-30 HISTORY — PX: COLONOSCOPY WITH PROPOFOL: SHX5780

## 2013-08-30 HISTORY — PX: BIOPSY: SHX5522

## 2013-08-30 HISTORY — DX: Unspecified hearing loss, unspecified ear: H91.90

## 2013-08-30 SURGERY — COLONOSCOPY WITH PROPOFOL
Anesthesia: Monitor Anesthesia Care | Site: Rectum

## 2013-08-30 MED ORDER — ONDANSETRON HCL 4 MG/2ML IJ SOLN
4.0000 mg | Freq: Once | INTRAMUSCULAR | Status: AC
  Administered 2013-08-30: 4 mg via INTRAVENOUS

## 2013-08-30 MED ORDER — ONDANSETRON HCL 4 MG/2ML IJ SOLN
INTRAMUSCULAR | Status: AC
  Filled 2013-08-30: qty 2

## 2013-08-30 MED ORDER — GLYCOPYRROLATE 0.2 MG/ML IJ SOLN
0.2000 mg | Freq: Once | INTRAMUSCULAR | Status: AC
  Administered 2013-08-30: 0.2 mg via INTRAVENOUS

## 2013-08-30 MED ORDER — MIDAZOLAM HCL 5 MG/5ML IJ SOLN
INTRAMUSCULAR | Status: DC | PRN
  Administered 2013-08-30 (×4): 1 mg via INTRAVENOUS

## 2013-08-30 MED ORDER — ONDANSETRON HCL 4 MG/2ML IJ SOLN: 4.0000 mg | Freq: Once | INTRAMUSCULAR | Status: DC | PRN

## 2013-08-30 MED ORDER — PROPOFOL 10 MG/ML IV BOLUS
INTRAVENOUS | Status: AC
  Filled 2013-08-30: qty 20

## 2013-08-30 MED ORDER — MIDAZOLAM HCL 2 MG/2ML IJ SOLN
INTRAMUSCULAR | Status: AC
  Filled 2013-08-30: qty 2

## 2013-08-30 MED ORDER — LACTATED RINGERS IV SOLN
INTRAVENOUS | Status: DC
  Administered 2013-08-30: 07:00:00 via INTRAVENOUS

## 2013-08-30 MED ORDER — FENTANYL CITRATE 0.05 MG/ML IJ SOLN
25.0000 ug | INTRAMUSCULAR | Status: AC
  Administered 2013-08-30: 07:00:00 via INTRAVENOUS

## 2013-08-30 MED ORDER — STERILE WATER FOR IRRIGATION IR SOLN
Status: DC | PRN
  Administered 2013-08-30: 08:00:00

## 2013-08-30 MED ORDER — FENTANYL CITRATE 0.05 MG/ML IJ SOLN
INTRAMUSCULAR | Status: AC
  Filled 2013-08-30: qty 2

## 2013-08-30 MED ORDER — MIDAZOLAM HCL 2 MG/2ML IJ SOLN
1.0000 mg | INTRAMUSCULAR | Status: DC | PRN
  Administered 2013-08-30: 2 mg via INTRAVENOUS

## 2013-08-30 MED ORDER — FENTANYL CITRATE 0.05 MG/ML IJ SOLN: 25.0000 ug | INTRAMUSCULAR | Status: DC | PRN

## 2013-08-30 MED ORDER — PROPOFOL INFUSION 10 MG/ML OPTIME
INTRAVENOUS | Status: DC | PRN
  Administered 2013-08-30: 08:00:00 via INTRAVENOUS
  Administered 2013-08-30: 40 ug/kg/min via INTRAVENOUS

## 2013-08-30 MED ORDER — GLYCOPYRROLATE 0.2 MG/ML IJ SOLN
INTRAMUSCULAR | Status: AC
  Filled 2013-08-30: qty 1

## 2013-08-30 MED ORDER — FENTANYL CITRATE 0.05 MG/ML IJ SOLN
INTRAMUSCULAR | Status: DC | PRN
  Administered 2013-08-30 (×3): 25 ug via INTRAVENOUS

## 2013-08-30 SURGICAL SUPPLY — 10 items
FLOOR PAD 36X40 (MISCELLANEOUS) ×3
FORCEPS BIOP RAD 4 LRG CAP 4 (CUTTING FORCEPS) ×2 IMPLANT
FORMALIN 10 PREFIL 20ML (MISCELLANEOUS) ×8 IMPLANT
KIT CLEAN ENDO COMPLIANCE (KITS) ×3 IMPLANT
LUBRICANT JELLY 4.5OZ STERILE (MISCELLANEOUS) ×2 IMPLANT
MANIFOLD NEPTUNE II (INSTRUMENTS) ×2 IMPLANT
PAD FLOOR 36X40 (MISCELLANEOUS) ×1 IMPLANT
TUBING ENDO SMARTCAP PENTAX (MISCELLANEOUS) ×2 IMPLANT
TUBING IRRIGATION ENDOGATOR (MISCELLANEOUS) ×2 IMPLANT
WATER STERILE IRR 1000ML POUR (IV SOLUTION) ×2 IMPLANT

## 2013-08-30 NOTE — Anesthesia Preprocedure Evaluation (Signed)
Anesthesia Evaluation  Patient identified by MRN, date of birth, ID band Patient awake    Reviewed: Allergy & Precautions, H&P , NPO status , Patient's Chart, lab work & pertinent test results  History of Anesthesia Complications (+) PONV and history of anesthetic complications  Airway Mallampati: I TM Distance: >3 FB     Dental  (+) Teeth Intact   Pulmonary asthma , former smoker,  breath sounds clear to auscultation        Cardiovascular Rhythm:Regular Rate:Normal     Neuro/Psych PSYCHIATRIC DISORDERS Anxiety Depression Bipolar Disorder ADHD   GI/Hepatic GERD-  Medicated,  Endo/Other    Renal/GU      Musculoskeletal   Abdominal   Peds  Hematology   Anesthesia Other Findings   Reproductive/Obstetrics                           Anesthesia Physical Anesthesia Plan  ASA: III  Anesthesia Plan: MAC   Post-op Pain Management:    Induction: Intravenous  Airway Management Planned: Simple Face Mask  Additional Equipment:   Intra-op Plan:   Post-operative Plan:   Informed Consent: I have reviewed the patients History and Physical, chart, labs and discussed the procedure including the risks, benefits and alternatives for the proposed anesthesia with the patient or authorized representative who has indicated his/her understanding and acceptance.     Plan Discussed with:   Anesthesia Plan Comments:         Anesthesia Quick Evaluation

## 2013-08-30 NOTE — Op Note (Signed)
[facility] [address] [place], [address]   COLONOSCOPY PROCEDURE REPORT  PATIENT: Wayne Avila, Wayne Avila.  MR#: [id number] BIRTHDATE: 1991/02/09 , 23  yrs. old GENDER: Male ENDOSCOPIST: Barney Drain, MD REFERRED VJ:KQASU Khalifa, M.D. PROCEDURE DATE:  08/30/2013 PROCEDURE:   Colonoscopy with biopsy and Colonoscopy with cold biopsy polypectomy INDICATIONS:Rectal Bleeding. MEDICATIONS: MAC sedation, administered by CRNA  DESCRIPTION OF PROCEDURE:    Physical exam was performed.  Informed consent was obtained from the patient after explaining the benefits, risks, and alternatives to procedure.  The patient was connected to monitor and placed in left lateral position. Continuous oxygen was provided by nasal cannula and IV medicine administered through an indwelling cannula.  After administration of sedation and rectal exam, the patients rectum was intubated and the     colonoscope was advanced under direct visualization to the ileum.  The scope was removed slowly by carefully examining the color, texture, anatomy, and integrity mucosa on the way out.  The patient was recovered in endoscopy and discharged home in satisfactory condition.    COLON FINDINGS: The mucosa appeared normal in the terminal ileum and COLITIS IN THE CECUM.  3 MM RECTAL POLYP REMOVED VIA COLD FORCEPS. OTHERWISE NL APPEARING COLON.  COLD FORCEPS BIOPSIES OBTAINED IN CECUM/ASCENDING COLON(#1), TRANSVERSE COLON(#2), AND DESCENDING/SIGMOID COLON(#3).  MILD PROCTITIS(#4).  COLD FORCEPS BIOPSIES OBTAINED.    NO HEMORRHOIDS.  PREP QUALITY: good.  CECAL W/D TIME: 22 minutes     COMPLICATIONS: None  ENDOSCOPIC IMPRESSION: 1.   Normal mucosa in the terminal ileum 2.   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

## 2013-08-30 NOTE — Discharge Instructions (Signed)
You have MILDLY active COLITIS AND PROCTITIS. I BIOPSIED YOUR COLON AND RECTUM. YOUR SMALL BOWEL IS NORMAL.     FOLLOW A LOW RESIDUE/DAIRY FREE DIET FOR 2 WEEKS.  AVOID TOBACCO PRODUCTS.  DO NOT USE ASPIRIN, BC/GOODY POWDERS,  OR IBUPROFEN, MOTRIN, ALEVE, OR NAPROXEN.   TYLENOL AS NEEDED FOR ABDOMINAL PAIN.   YOUR BIOPSY WILL BE BACK IN 7 DAYS.  FOLLOW UP IN 2 MOS.   Colonoscopy Care After Read the instructions outlined below and refer to this sheet in the next week. These discharge instructions provide you with general information on caring for yourself after you leave the hospital. While your treatment has been planned according to the most current medical practices available, unavoidable complications occasionally occur. If you have any problems or questions after discharge, call DR. Akina Maish, [phone].  ACTIVITY  You may resume your regular activity, but move at a slower pace for the next 24 hours.   Take frequent rest periods for the next 24 hours.   Walking will help get rid of the air and reduce the bloated feeling in your belly (abdomen).   No driving for 24 hours (because of the medicine (anesthesia) used during the test).   You may shower.   Do not sign any important legal documents or operate any machinery for 24 hours (because of the anesthesia used during the test).    NUTRITION  Drink plenty of fluids.   You may resume your normal diet as instructed by your doctor.   Begin with a light meal and progress to your normal diet. Heavy or fried foods are harder to digest and may make you feel sick to your stomach (nauseated).   Avoid alcoholic beverages for 24 hours or as instructed.    MEDICATIONS  You may resume your normal medications.   WHAT YOU CAN EXPECT TODAY  Some feelings of bloating in the abdomen.   Passage of more gas than usual.   Spotting of blood in your stool or on the toilet paper  .  IF YOU HAD POLYPS REMOVED DURING THE  COLONOSCOPY:  No aspirin products for 7 days or as instructed.   Eat a soft diet IF YOU HAVE NAUSEA, BLOATING, ABDOMINAL PAIN, OR VOMITING.    FINDING OUT THE RESULTS OF YOUR TEST Not all test results are available during your visit. DR. [name] WILL CALL YOU WITHIN 7 DAYS OF YOUR PROCEDUE WITH YOUR RESULTS. Do not assume everything is normal if you have not heard from DR. Joell Buerger IN ONE WEEK, CALL HER OFFICE AT [phone].  SEEK IMMEDIATE MEDICAL ATTENTION AND CALL THE OFFICE: [phone] IF:  You have more than a spotting of blood in your stool.   Your belly is swollen (abdominal distention).   You are nauseated or vomiting.   You have a temperature over 101F.   You have abdominal pain or discomfort that is severe or gets worse throughout the day.   Low Fiber and Residue Restricted Diet A low fiber diet restricts foods that contain carbohydrates that are not digested in the small intestine. A diet containing about 10 g of fiber is considered low fiber. The diet needs to be individualized to suit patient tolerances and preferences and to avoid unnecessary restrictions. Generally, the foods emphasized in a low fiber diet have no skins or seeds. They may have been processed to remove bran, germ, or husks. Cooking may not necessarily eliminate the fiber. Cooking may, in fact, enable a greater quantity of fiber to be consumed  in a lesser volume. Legumes and nuts are also restricted. The term low residue has also been used to describe low fiber diets, although the two are not the same. Residue refers to any substance that adds to bowel (colonic) contents, such as sloughed cells and intestinal bacteria, in addition to fiber. Residue-containing foods, prunes and prune juice, milk, and connective tissue from meats may also need to be eliminated. It is important to eliminate these foods during sudden (acute) attacks of inflammatory bowel disease, when there is a partial obstruction due to another  reason, or when minimal fecal output is desired. When these problems are gone, a more normal diet may be used.  PURPOSE  Reduce stool weight and volume.   CHOOSING FOODS Check labels, especially on foods from the starch list. Often, dietary fiber content is listed with the Nutrition Facts panel.  Breads and Starches  Allowed: White, [place], and pita breads, plain rolls, buns, or sweet rolls, doughnuts, waffles, pancakes, bagels. Plain muffins, sweet breads, biscuits, matzoth. Flour. Soda, saltine, or graham crackers. Pretzels, rusks, melba toast, zwieback. Cooked cereals: cornmeal, farina, cream cereals. Dry cereals: refined corn, wheat, rice, and oat cereals (check label). Potatoes prepared any way without skins, refined macaroni, spaghetti, noodles, refined rice.   Avoid: Bread, rolls, or crackers made with whole-wheat, multigrains, rye, bran seeds, nuts, or coconut. Corn tortillas, table-shells. Corn chips, tortilla chips. Cereals containing whole-grains, multigrains, bran, coconut, nuts, or raisins. Cooked or dry oatmeal. Coarse wheat cereals, granola. Cereals advertised as "high fiber." Potato skins. Whole-grain pasta, wild or brown rice. Popcorn.  Vegetables  Allowed:  Strained tomato and vegetable juices. Fresh: tender lettuce, cucumber, cabbage, spinach, bean sprouts. Cooked, canned: asparagus, bean sprouts, cut green or wax beans, cauliflower, pumpkin, beets, mushrooms, olives, spinach, yellow squash, tomato, tomato sauce (no seeds), zucchini (peeled), turnips. Canned sweet potatoes. Small amounts of celery, onion, radish, and green pepper may be used. Keep servings limited to  cup.   Avoid: Fresh, cooked, or canned: artichokes, baked beans, beet greens, broccoli, Brussels sprouts, French-style green beans, corn, kale, legumes, peas, sweet potatoes. Cooked: green or red cabbage, spinach. Avoid large servings of any vegetables.  Fruit  Allowed:  All fruit juices except prune juice.  Cooked or canned: apricots applesauce, cantaloupe, cherries, grapefruit, grapes, kiwi, mandarin oranges, peaches, pears, fruit cocktail, pineapple, plums, watermelon. Fresh: banana, grapes, cantaloupe, avocado, cherries, pineapple, grapefruit, kiwi, nectarines, peaches, oranges, blueberries, plums. Keep servings limited to  cup or 1 piece.   Avoid: Fresh: apple with or without skin, apricots, mango, pears, raspberries, strawberries. Prune juice, stewed or dried prunes. Dried fruits, raisins, dates. Avoid large servings of all fresh fruits.  Meat and Meat Substitutes  Allowed:  Ground or well-cooked tender beef, ham, veal, lamb, pork, or poultry. Eggs, plain cheese. Fish, oysters, shrimp, lobster, other seafood. Liver, organ meats.   Avoid: Tough, fibrous meats with gristle. Peanut butter, smooth or chunky. Cheese with seeds, nuts, or other foods not allowed. Nuts, seeds, legumes, dried peas, beans, lentils.  Milk  Allowed:  All milk products except those not allowed. Milk and milk product consumption should be minimal when low residue is desired.   Avoid: Yogurt that contains nuts or seeds.  Soups and Combination Foods  Allowed:  Bouillon, broth, or cream soups made from allowed foods. Any strained soup. Casseroles or mixed dishes made with allowed foods.   Avoid: Soups made from vegetables that are not allowed or that contain other foods not allowed.  Desserts and Sweets  Allowed:  Plain cakes and cookies, pie made with allowed fruit, pudding, custard, cream pie. Gelatin, fruit, ice, sherbet, frozen ice pops. Ice cream, ice milk without nuts. Plain hard candy, honey, jelly, molasses, syrup, sugar, chocolate syrup, gumdrops, marshmallows.   Avoid: Desserts, cookies, or candies that contain nuts, peanut butter, or dried fruits. Jams, preserves with seeds, marmalade.  Fats and Oils  Allowed:  Margarine, butter, cream, mayonnaise, salad oils, plain salad dressings made from allowed foods.  Plain gravy, crisp bacon without rind.   Avoid: Seeds, nuts, olives. Avocados.  Beverages  Allowed:  All, except those listed to avoid.   Avoid: Fruit juices with high pulp, prune juice.  Condiments  Allowed:  Ketchup, mustard, horseradish, vinegar, cream sauce, cheese sauce, cocoa powder. Spices in moderation: allspice, basil, bay leaves, celery powder or leaves, cinnamon, cumin powder, curry powder, ginger, mace, marjoram, onion or garlic powder, oregano, paprika, parsley flakes, ground pepper, rosemary, sage, savory, tarragon, thyme, turmeric.   Avoid: Coconut, pickles.  SAMPLE MEAL PLAN The following menu is provided as a sample. Your daily menu plans will vary. Be sure to include a minimum of the following each day in order to provide essential nutrients for the adult:  Starch/Bread/Cereal Group, 6 servings.   Fruit/Vegetable Group, 5 servings.   Meat/Meat Substitute Group, 2 servings.   Milk/Milk Substitute Group, 2 servings.  A serving is equal to  cup for fruits, vegetables, and cooked cereals or 1 piece for foods such as a piece of bread, 1 orange, or 1 apple. For dry cereals and crackers, use serving sizes listed on the label. Combination foods may count as full or partial servings from various food groups. Fats, desserts, and sweets may be added to the meal plan after the requirements for essential nutrients are met. SAMPLE MENU Breakfast   cup orange juice.   1 boiled egg.   1 slice white toast.   Margarine.    cup cornflakes.   1 cup milk.   Beverage.  Lunch   cup chicken noodle soup.   2 to 3 oz sliced roast beef.   2 slices seedless rye bread.   Mayonnaise.    cup tomato juice.   1 small banana.   Beverage.  Dinner  3 oz baked chicken.    cup scalloped potatoes.    cup cooked beets.   White dinner roll.   Margarine.    cup canned peaches.   Beverage.     Lactose Free Diet Lactose is a carbohydrate that is found mainly in  milk and milk products, as well as in foods with added milk or whey. Lactose must be digested by the enzyme in order to be used by the body. Lactose intolerance occurs when there is a shortage of lactase. When your body is not able to digest lactose, you may feel sick to your stomach (nausea), bloating, cramping, gas and diarrhea.  There are many dairy products that may be tolerated better than milk by some people:  The use of cultured dairy products such as yogurt, buttermilk, cottage cheese, and sweet acidophilus milk (Kefir) for lactase-deficient individuals is usually well tolerated. This is because the healthy bacteria help digest lactose.   Lactose-hydrolyzed milk (Lactaid) contains 40-90% less lactose than milk and may also be well tolerated.    SPECIAL NOTES  Lactose is a carbohydrates. The major food source is dairy products. Reading food labels is important. Many products contain lactose even when they are not made from milk. Look for  the following words: whey, milk solids, dry milk solids, nonfat dry milk powder. Typical sources of lactose other than dairy products include breads, candies, cold cuts, prepared and processed foods, and commercial sauces and gravies.   All foods must be prepared without milk, cream, or other dairy foods.   Soy milk and lactose-free supplements (LACTASE) may be used as an alternative to milk.   FOOD GROUP ALLOWED/RECOMMENDED AVOID/USE SPARINGLY  BREADS / STARCHES 4 servings or more* Breads and rolls made without milk. [place], [place], or [place] bread. Breads and rolls that contain milk. Prepared mixes such as muffins, biscuits, waffles, pancakes. Sweet rolls, donuts, [place] toast (if made with milk or lactose).  Crackers: Soda crackers, graham crackers. Any crackers prepared without lactose. Zwieback crackers, corn curls, or any that contain lactose.  Cereals: Cooked or dry cereals prepared without lactose (read labels). Cooked or dry cereals prepared  with lactose (read labels). Total, Cocoa Krispies. Special K.  Potatoes / Pasta / Rice: Any prepared without milk or lactose. Popcorn. Instant potatoes, frozen [place] fries, scalloped or au gratin potatoes.  VEGETABLES 2 servings or more Fresh, frozen, and canned vegetables. Creamed or breaded vegetables. Vegetables in a cheese sauce or with lactose-containing margarines.  FRUIT 2 servings or more All fresh, canned, or frozen fruits that are not processed with lactose. Any canned or frozen fruits processed with lactose.  MEAT & SUBSTITUTES 2 servings or more (4 to 6 oz. total per day) Plain beef, chicken, fish, [place], lamb, veal, pork, or ham. Kosher prepared meat products. Strained or junior meats that do not contain milk. Eggs, soy meat substitutes, nuts. Scrambled eggs, omelets, and souffles that contain milk. Creamed or breaded meat, fish, or fowl. Sausage products such as wieners, liver sausage, or cold cuts that contain milk solids. Cheese, cottage cheese, or cheese spreads.  MILK None. (See BEVERAGES for milk substitutes. See DESSERTS for ice cream and frozen desserts.) Milk (whole, 2%, skim, or chocolate). Evaporated, powdered, or condensed milk; malted milk.  SOUPS & COMBINATION FOODS Bouillon, broth, vegetable soups, clear soups, consomms. Homemade soups made with allowed ingredients. Combination or prepared foods that do not contain milk or milk products (read labels). Cream soups, chowders, commercially prepared soups containing lactose. Macaroni and cheese, pizza. Combination or prepared foods that contain milk or milk products.  DESSERTS & SWEETS In moderation Water and fruit ices; gelatin; angel food cake. Homemade cookies, pies, or cakes made from allowed ingredients. Pudding (if made with water or a milk substitute). Lactose-free tofu desserts. Sugar, honey, corn syrup, jam, jelly; marmalade; molasses (beet sugar); Pure sugar candy; marshmallows. Ice cream, ice milk, sherbet,  custard, pudding, frozen yogurt. Commercial cake and cookie mixes. Desserts that contain chocolate. Pie crust made with milk-containing margarine; reduced-calorie desserts made with a sugar substitute that contains lactose. Toffee, peppermint, butterscotch, chocolate, caramels.  FATS & OILS In moderation Butter (as tolerated; contains very small amounts of lactose). Margarines and dressings that do not contain milk, Vegetable oils, shortening, Miracle Whip, mayonnaise, nondairy cream & whipped toppings without lactose or milk solids added (examples: Coffee Rich, Carnation Coffeemate, Rich's Whipped Topping, PolyRich). Berniece Salines. Margarines and salad dressings containing milk; cream, cream cheese; peanut butter with added milk solids, sour cream, chip dips, made with sour cream.  BEVERAGES Carbonated drinks; tea; coffee and freeze-dried coffee; some instant coffees (check labels). Fruit drinks; fruit and vegetable juice; Rice or Soy milk. Ovaltine, hot chocolate. Some cocoas; some instant coffees; instant iced teas; powdered fruit drinks (read labels).  CONDIMENTS / MISCELLANEOUS Soy sauce, carob powder, olives, gravy made with water, baker's cocoa, pickles, pure seasonings and spices, wine, pure monosodium glutamate, catsup, mustard. Some chewing gums, chocolate, some cocoas. Certain antibiotics and vitamin / mineral preparations. Spice blends if they contain milk products. MSG extender. Artificial sweeteners that contain lactose such as Equal (Nutra-Sweet) and Sweet 'n Low. Some nondairy creamers (read labels).   SAMPLE MENU*  Breakfast   Orange Juice.  Banana.   Bran flakes.   Nondairy Creamer.  [place] Bread (toasted).   Butter or milk-free margarine.   Coffee or tea.    Noon Meal   Chicken Breast.  Rice.   Green beans.   Butter or milk-free margarine.  Fresh melon.   Coffee or tea.    Evening Meal   Roast Beef.  Baked potato.   Butter or milk-free margarine.    Broccoli.   Lettuce salad with vinegar and oil dressing.  [employer]   Coffee or tea.

## 2013-08-30 NOTE — H&P (Signed)
Primary Care Physician:  Graciella Freer, MD Primary Gastroenterologist:  Dr. [name]  Pre-Procedure History & Physical: HPI:  Wayne Avila is a 23 y.o. male here for  BRBPR.  Past Medical History  Diagnosis Date  . Asthma   . ADHD (attention deficit hyperactivity disorder)   . Bipolar 1 disorder   . GERD (gastroesophageal reflux disease)   . Depression   . Anxiety   . Mental developmental delay 11/15/2012  . PONV (postoperative nausea and vomiting)   . Hearing loss   . HOH (hard of hearing)     Past Surgical History  Procedure Laterality Date  . Tonsillectomy    . Adenoidectomy    . Esophagogastroduodenoscopy (egd) with propofol N/A 12/07/2012    SLF:The mucosa of the esophagus appeared normal Non-erosive gastritis (inflammation) was found in the gastric antrum; multiple biopsies The duodenal mucosa showed no abnormalities in the bulb and second portion of the duodenum  . Esophageal biopsy N/A 12/07/2012    Procedure: GASTRIC BIOPSIES;  Surgeon: [name], MD;  Location: [facility];  Service: Endoscopy;  Laterality: N/A;    Prior to Admission medications   Medication Sig Start Date End Date Taking? Authorizing Provider  albuterol (PROVENTIL) (2.5 MG/3ML) 0.083% nebulizer solution Take 2.5 mg by nebulization every 6 (six) hours as needed. For shortness of breath   Yes Historical Provider, MD  atomoxetine (STRATTERA) 100 MG capsule Take 100 mg by mouth every morning.    Yes Historical Provider, MD  loratadine (CLARITIN) 10 MG tablet Take 10 mg by mouth at bedtime.   Yes Historical Provider, MD  methylphenidate (CONCERTA) 54 MG CR tablet Take 54 mg by mouth every morning.   Yes Historical Provider, MD  montelukast (SINGULAIR) 10 MG tablet Take 10 mg by mouth at bedtime.   Yes Historical Provider, MD  OLANZapine (ZYPREXA) 10 MG tablet Take 10 mg by mouth at bedtime.   Yes Historical Provider, MD  polyethylene glycol-electrolytes (TRILYTE) 420 G solution Take 4,000 mLs by mouth as  directed. 08/18/13  Yes [name], MD  PROAIR HFA 108 (90 BASE) MCG/ACT inhaler INHALE 2 PUFFS BY MOUTH EVERY 4 HOURS AS NEEDED FOR WHEEZING OR DRY PERSISTENT COUGH   Yes Doran Heater, MD  sertraline (ZOLOFT) 50 MG tablet Take 50 mg by mouth daily.   Yes Historical Provider, MD  ziprasidone (GEODON) 40 MG capsule Take 40 mg by mouth at bedtime.   Yes Historical Provider, MD  EPINEPHrine (EPIPEN) 0.3 mg/0.3 mL DEVI Inject 0.3 mLs (0.3 mg total) into the muscle once. 11/15/12   Garvin Fila, MD    Allergies as of 08/18/2013 - Review Complete 08/09/2013  Allergen Reaction Noted  . Amoxicillin-pot clavulanate Nausea And Vomiting 02/28/2011  . Omeprazole Nausea And Vomiting 04/20/2013  . Pineapple Swelling 04/14/2011  . Strawberry Swelling 05/08/2012  . Tomato Rash 04/14/2011    Family History  Problem Relation Age of Onset  . Asthma Mother   . Ulcers Mother   . Bipolar disorder Mother   . Colon cancer Neg Hx   . Liver disease Neg Hx   . ADD / ADHD Father   . Diabetes Maternal Grandmother   . Diabetes Maternal Grandfather     History   Social History  . Marital Status: Single    Spouse Name: N/A    Number of Children: 0  . Years of Education: 12th   Occupational History  .      not working   Social History  Main Topics  . Smoking status: Former Smoker -- 1.00 packs/day for 2 years    Types: Cigarettes    Quit date: 10/06/2012  . Smokeless tobacco: Never Used  . Alcohol Use: No  . Drug Use: No  . Sexual Activity: Yes   Other Topics Concern  . Not on file   Social History Narrative   Patient lives at home with girlfriend and family.   Caffeine Use: occasionally    Review of Systems: See HPI, otherwise negative ROS   Physical Exam: BP 131/86  Pulse 80  Temp(Src) 98.2 F (36.8 C) (Oral)  Resp 15  Wt 178 lb (80.74 kg)  SpO2 100% General:   Alert,  pleasant and cooperative in NAD Head:  Normocephalic and atraumatic. Neck:  Supple; Lungs:  Clear  throughout to auscultation.    Heart:  Regular rate and rhythm. Abdomen:  Soft, nontender and nondistended. Normal bowel sounds, without guarding, and without rebound.   Neurologic:  Alert and  oriented x4;  grossly normal neurologically.  Impression/Plan:    BRBPR  PLAN: TCS TODAY

## 2013-08-30 NOTE — Anesthesia Postprocedure Evaluation (Signed)
  Anesthesia Post-op Note  Patient: Wayne Avila  Procedure(s) Performed: Procedure(s) with comments: COLONOSCOPY WITH PROPOFOL (N/A) - entered cecum @ [id number]; cecal withdrawal time- minutes BIOPSY (N/A) - right colon,transverse colon, left colon, rectal biopsies  Patient Location: PACU  Anesthesia Type:MAC  Level of Consciousness: awake, alert , oriented and patient cooperative  Airway and Oxygen Therapy: Patient Spontanous Breathing  Post-op Pain: none  Post-op Assessment: Post-op Vital signs reviewed, Patient's Cardiovascular Status Stable, Respiratory Function Stable, Patent Airway, No signs of Nausea or vomiting and Pain level controlled  Post-op Vital Signs: Reviewed and stable  Complications: No apparent anesthesia complications

## 2013-08-30 NOTE — Transfer of Care (Signed)
Immediate Anesthesia Transfer of Care Note  Patient: Wayne Avila  Procedure(s) Performed: Procedure(s) with comments: COLONOSCOPY WITH PROPOFOL (N/A) - entered cecum @ 0753; cecal withdrawal time- minutes BIOPSY (N/A) - right colon,transverse colon, left colon, rectal biopsies  Patient Location: PACU  Anesthesia Type:MAC  Level of Consciousness: sedated  Airway & Oxygen Therapy: Patient Spontanous Breathing and Patient connected to face mask oxygen  Post-op Assessment: Report given to PACU RN and Post -op Vital signs reviewed and stable  Post vital signs: Reviewed and stable  Complications: No apparent anesthesia complications

## 2013-09-01 ENCOUNTER — Encounter (HOSPITAL_COMMUNITY): Payer: Self-pay | Admitting: Gastroenterology

## 2013-09-06 ENCOUNTER — Telehealth: Payer: Self-pay | Admitting: Gastroenterology

## 2013-09-06 ENCOUNTER — Telehealth: Payer: Self-pay

## 2013-09-06 MED ORDER — PREDNISONE 10 MG PO TABS: ORAL_TABLET | ORAL | Status: DC

## 2013-09-06 NOTE — Telephone Encounter (Signed)
Called patient'S mother. STILL HAVING BLOODY DIARRHEA. PT NEED PREDNISONE TAPER. OBTAIN PROMETHEUS IBD PANEL. PT WILL NEED UCERIS IF UC AND CONSIDER HUMIRA IF CROHN'S DISEASE.

## 2013-09-06 NOTE — Telephone Encounter (Signed)
T/C from [place] the pharmacist at [facility] in reference to the prescription for prednisone written by Dr. [name] today. He said the directions were for one tablet bid for 14 days and one daily for 14 days which would be # 42 tablets. The Rx was written for #56 tablets and he wanted to know which to do. I verified the Rx was written as he said and told him that #42 will be fine and I will let Dr. [name] know so if there is a problem.

## 2013-09-06 NOTE — Telephone Encounter (Signed)
ATTEMPTED TO REVIEW BX WITH DR. Nicoletta Dress. SHE IS NOT AVAILABLE UNTIL APR 8. SHE WILL PAGE ME WHEN SHE RETURNS.

## 2013-09-07 ENCOUNTER — Other Ambulatory Visit: Payer: Self-pay | Admitting: Gastroenterology

## 2013-09-07 NOTE — Telephone Encounter (Signed)
REVIEWED. AGREE. 

## 2013-09-07 NOTE — Telephone Encounter (Signed)
[facility] lab order faxed to [facility]. Family was aware.

## 2013-09-13 ENCOUNTER — Encounter (HOSPITAL_COMMUNITY): Payer: Self-pay | Admitting: Emergency Medicine

## 2013-09-13 ENCOUNTER — Emergency Department (HOSPITAL_COMMUNITY): Payer: Medicaid Other

## 2013-09-13 ENCOUNTER — Emergency Department (HOSPITAL_COMMUNITY)
Admission: EM | Admit: 2013-09-13 | Discharge: 2013-09-13 | Disposition: A | Discharge status: Home / Self Care | Payer: Medicaid Other | Attending: Emergency Medicine | Admitting: Emergency Medicine

## 2013-09-13 DIAGNOSIS — R52 Pain, unspecified: Secondary | ICD-10-CM | POA: Insufficient documentation

## 2013-09-13 DIAGNOSIS — Z8719 Personal history of other diseases of the digestive system: Secondary | ICD-10-CM | POA: Insufficient documentation

## 2013-09-13 DIAGNOSIS — J45909 Unspecified asthma, uncomplicated: Secondary | ICD-10-CM | POA: Insufficient documentation

## 2013-09-13 DIAGNOSIS — F411 Generalized anxiety disorder: Secondary | ICD-10-CM | POA: Insufficient documentation

## 2013-09-13 DIAGNOSIS — R109 Unspecified abdominal pain: Secondary | ICD-10-CM

## 2013-09-13 DIAGNOSIS — R1011 Right upper quadrant pain: Secondary | ICD-10-CM | POA: Insufficient documentation

## 2013-09-13 DIAGNOSIS — H9190 Unspecified hearing loss, unspecified ear: Secondary | ICD-10-CM | POA: Insufficient documentation

## 2013-09-13 DIAGNOSIS — IMO0002 Reserved for concepts with insufficient information to code with codable children: Secondary | ICD-10-CM | POA: Insufficient documentation

## 2013-09-13 DIAGNOSIS — Z79899 Other long term (current) drug therapy: Secondary | ICD-10-CM | POA: Insufficient documentation

## 2013-09-13 DIAGNOSIS — Z87891 Personal history of nicotine dependence: Secondary | ICD-10-CM | POA: Insufficient documentation

## 2013-09-13 DIAGNOSIS — F909 Attention-deficit hyperactivity disorder, unspecified type: Secondary | ICD-10-CM | POA: Insufficient documentation

## 2013-09-13 DIAGNOSIS — F319 Bipolar disorder, unspecified: Secondary | ICD-10-CM | POA: Insufficient documentation

## 2013-09-13 DIAGNOSIS — G8929 Other chronic pain: Secondary | ICD-10-CM | POA: Insufficient documentation

## 2013-09-13 LAB — BASIC METABOLIC PANEL
BUN: 12 mg/dL (ref 6–23)
CO2: 24 mEq/L (ref 19–32)
Calcium: 9.5 mg/dL (ref 8.4–10.5)
Chloride: 102 mEq/L (ref 96–112)
Creatinine, Ser: 0.94 mg/dL (ref 0.50–1.35)
GFR calc Af Amer: >90 mL/min (ref >90)
GFR calc non Af Amer: >90 mL/min (ref >90)
Glucose, Bld: 86 mg/dL (ref 70–99)
Potassium: 3.8 mEq/L (ref 3.7–5.3)
Sodium: 141 mEq/L (ref 137–147)

## 2013-09-13 LAB — CBC WITH DIFFERENTIAL/PLATELET
Basophils Absolute: 0 10*3/uL (ref 0.0–0.1)
Basophils Relative: 0 % (ref 0–1)
Eosinophils Absolute: 0 10*3/uL (ref 0.0–0.7)
Eosinophils Relative: 1 % (ref 0–5)
HCT: 41.4 % (ref 39.0–52.0)
Hemoglobin: 14 g/dL (ref 13.0–17.0)
Lymphocytes Relative: 24 % (ref 12–46)
Lymphs Abs: 2 10*3/uL (ref 0.7–4.0)
MCH: 27.9 pg (ref 26.0–34.0)
MCHC: 33.8 g/dL (ref 30.0–36.0)
MCV: 82.5 fL (ref 78.0–100.0)
Monocytes Absolute: 0.6 10*3/uL (ref 0.1–1.0)
Monocytes Relative: 7 % (ref 3–12)
Neutro Abs: 5.6 10*3/uL (ref 1.7–7.7)
Neutrophils Relative %: 68 % (ref 43–77)
Platelets: 252 10*3/uL (ref 150–400)
RBC: 5.02 MIL/uL (ref 4.22–5.81)
RDW: 13.1 % (ref 11.5–15.5)
WBC: 8.3 10*3/uL (ref 4.0–10.5)

## 2013-09-13 LAB — HEPATIC FUNCTION PANEL
ALT: 13 U/L (ref 0–53)
AST: 23 U/L (ref 0–37)
Albumin: 4.1 g/dL (ref 3.5–5.2)
Alkaline Phosphatase: 66 U/L (ref 39–117)
Bilirubin, Direct: <0.2 mg/dL (ref 0.0–0.3)
Total Bilirubin: 0.3 mg/dL (ref 0.3–1.2)
Total Protein: 7.7 g/dL (ref 6.0–8.3)

## 2013-09-13 LAB — LIPASE, BLOOD: Lipase: 24 U/L (ref 11–59)

## 2013-09-13 MED ORDER — IOHEXOL 300 MG/ML  SOLN
50.0000 mL | Freq: Once | INTRAMUSCULAR | Status: AC | PRN
  Administered 2013-09-13: 50 mL via ORAL

## 2013-09-13 MED ORDER — ONDANSETRON HCL 4 MG PO TABS: 4.0000 mg | ORAL_TABLET | Freq: Three times a day (TID) | ORAL | Status: DC | PRN

## 2013-09-13 MED ORDER — IOHEXOL 300 MG/ML  SOLN
100.0000 mL | Freq: Once | INTRAMUSCULAR | Status: AC | PRN
Start: 2013-09-13 — End: 2013-09-13
  Administered 2013-09-13: 100 mL via INTRAVENOUS

## 2013-09-13 MED ORDER — SODIUM CHLORIDE 0.9 % IV SOLN
INTRAVENOUS | Status: DC
  Administered 2013-09-13: 19:00:00 via INTRAVENOUS

## 2013-09-13 MED ORDER — FAMOTIDINE IN NACL 20-0.9 MG/50ML-% IV SOLN
20.0000 mg | Freq: Once | INTRAVENOUS | Status: AC
  Administered 2013-09-13: 20 mg via INTRAVENOUS
  Filled 2013-09-13: qty 50

## 2013-09-13 MED ORDER — ACETAMINOPHEN 500 MG PO TABS
1000.0000 mg | ORAL_TABLET | Freq: Once | ORAL | Status: AC
  Administered 2013-09-13: 1000 mg via ORAL
  Filled 2013-09-13: qty 2

## 2013-09-13 MED ORDER — ONDANSETRON HCL 4 MG/2ML IJ SOLN
4.0000 mg | INTRAMUSCULAR | Status: DC | PRN
  Administered 2013-09-13: 4 mg via INTRAVENOUS
  Filled 2013-09-13: qty 2

## 2013-09-13 NOTE — ED Provider Notes (Signed)
CSN: [id number]     Arrival date & time 09/13/13  1402 History   First MD Initiated Contact with Patient 09/13/13 1756     Chief Complaint  Patient presents with  . Abdominal Pain      Patient is a 23 y.o. male presenting with abdominal pain. The history is provided by a caregiver, a relative and the patient. The history is limited by the condition of the patient (Hx MR).  Abdominal Pain Pt was seen at 1900.  Per pt's family, c/o gradual onset and persistence of constant acute flair of his chronic RUQ abd "pain" since this morning.  Describes the abd pain as "tight." Pt has been evaluated by his GI MD for same, dx gastritis on EGD. Family states he "is supposed to take his stomach medicines but doesn't any more because he just throws them up."  Denies N/V/D, no fevers, no back pain, no rash, no CP/SOB, no black or blood in stools.      Past Medical History  Diagnosis Date  . Asthma   . ADHD (attention deficit hyperactivity disorder)   . Bipolar 1 disorder   . GERD (gastroesophageal reflux disease)   . Depression   . Anxiety   . Mental developmental delay 11/15/2012  . PONV (postoperative nausea and vomiting)   . Hearing loss   . HOH (hard of hearing)   . Abdominal pain, chronic, right upper quadrant   . Colitis     possible Crohn's, seen on colonoscopy 08/30/2013   Past Surgical History  Procedure Laterality Date  . Tonsillectomy    . Adenoidectomy    . Esophagogastroduodenoscopy (egd) with propofol N/A 12/07/2012    SLF:The mucosa of the esophagus appeared normal Non-erosive gastritis (inflammation) was found in the gastric antrum; multiple biopsies The duodenal mucosa showed no abnormalities in the bulb and second portion of the duodenum  . Esophageal biopsy N/A 12/07/2012    Procedure: GASTRIC BIOPSIES;  Surgeon: [name], MD;  Location: [facility];  Service: Endoscopy;  Laterality: N/A;  . Colonoscopy with propofol N/A 08/30/2013    Procedure: COLONOSCOPY WITH PROPOFOL;  Surgeon:  [name], MD;  Location: [facility];  Service: Endoscopy;  Laterality: N/A;  entered cecum @ 0753; cecal withdrawal time- 20 minutes  . Esophageal biopsy N/A 08/30/2013    Procedure: BIOPSY;  Surgeon: [name], MD;  Location: [facility];  Service: Endoscopy;  Laterality: N/A;  right colon,transverse colon, left colon, rectal biopsies   Family History  Problem Relation Age of Onset  . Asthma Mother   . Ulcers Mother   . Bipolar disorder Mother   . Colon cancer Neg Hx   . Liver disease Neg Hx   . ADD / ADHD Father   . Diabetes Maternal Grandmother   . Diabetes Maternal Grandfather    History  Substance Use Topics  . Smoking status: Former Smoker -- 1.00 packs/day for 2 years    Types: Cigarettes    Quit date: 10/06/2012  . Smokeless tobacco: Never Used  . Alcohol Use: No    Review of Systems  Unable to perform ROS: Other  Gastrointestinal: Positive for abdominal pain.      Allergies  Amoxicillin-pot clavulanate; Omeprazole; Pineapple; Strawberry; and Tomato  Home Medications   Current Outpatient Rx  Name  Route  Sig  Dispense  Refill  . albuterol (PROAIR HFA) 108 (90 BASE) MCG/ACT inhaler   Inhalation   Inhale 2 puffs into the lungs every 4 (four) hours  as needed for wheezing. Persistent dry cough         . albuterol (PROVENTIL) (2.5 MG/3ML) 0.083% nebulizer solution   Nebulization   Take 2.5 mg by nebulization every 6 (six) hours as needed. For shortness of breath         . atomoxetine (STRATTERA) 100 MG capsule   Oral   Take 100 mg by mouth every morning.          . loratadine (CLARITIN) 10 MG tablet   Oral   Take 10 mg by mouth at bedtime.         . methylphenidate (CONCERTA) 54 MG CR tablet   Oral   Take 54 mg by mouth every morning.         . montelukast (SINGULAIR) 10 MG tablet   Oral   Take 10 mg by mouth at bedtime.         . predniSONE (DELTASONE) 10 MG tablet      2 PO QD FOR 14 DAYS THEN  THEN 1 PO QD FOR 14 DAYS   56 tablet    0   . sertraline (ZOLOFT) 50 MG tablet   Oral   Take 50 mg by mouth daily.         . ziprasidone (GEODON) 40 MG capsule   Oral   Take 40 mg by mouth at bedtime.         Marland Kitchen EPINEPHrine (EPIPEN) 0.3 mg/0.3 mL DEVI   Intramuscular   Inject 0.3 mLs (0.3 mg total) into the muscle once.   1 Device   2    BP 138/79  Pulse 83  Temp(Src) 98.3 F (36.8 C) (Oral)  Resp 18  Wt 178 lb (80.74 kg)  SpO2 100% Physical Exam 1905: Physical examination:  Nursing notes reviewed; Vital signs and O2 SAT reviewed;  Constitutional: Well developed, Well nourished, Well hydrated, In no acute distress; Head:  Normocephalic, atraumatic; Eyes: EOMI, PERRL, No scleral icterus; ENMT: Mouth and pharynx normal, Mucous membranes moist; Neck: Supple, Full range of motion, No lymphadenopathy; Cardiovascular: Regular rate and rhythm, No gallop; Respiratory: Breath sounds clear & equal bilaterally, No rales, rhonchi, wheezes.  Speaking full sentences with ease, Normal respiratory effort/excursion; Chest: Nontender, Movement normal; Abdomen: Soft, +RUQ and mid-epigastric tenderness to palp. Nondistended, Normal bowel sounds; Genitourinary: No CVA tenderness; Extremities: Pulses normal, No tenderness, No edema, No calf edema or asymmetry.; Neuro: Awake, alert, poor historian per hx MR. Major CN grossly intact.  Speech clear. No gross focal motor or sensory deficits in extremities.; Skin: Color normal, Warm, Dry.   ED Course  Procedures     EKG Interpretation None      MDM  MDM Reviewed: previous chart, nursing note and vitals Reviewed previous: labs Interpretation: labs, CT scan and x-ray   Results for orders placed during the hospital encounter of 09/13/13  CBC WITH DIFFERENTIAL      Result Value Ref Range   WBC 8.3  4.0 - 10.5 K/uL   RBC 5.02  4.22 - 5.81 MIL/uL   Hemoglobin 14.0  13.0 - 17.0 g/dL   HCT 41.4  39.0 - 52.0 %   MCV 82.5  78.0 - 100.0 fL   MCH 27.9  26.0 - 34.0 pg   MCHC 33.8  30.0 -  36.0 g/dL   RDW 13.1  11.5 - 15.5 %   Platelets 252  150 - 400 K/uL   Neutrophils Relative % 68  43 - 77 %   Neutro Abs  5.6  1.7 - 7.7 K/uL   Lymphocytes Relative 24  12 - 46 %   Lymphs Abs 2.0  0.7 - 4.0 K/uL   Monocytes Relative 7  3 - 12 %   Monocytes Absolute 0.6  0.1 - 1.0 K/uL   Eosinophils Relative 1  0 - 5 %   Eosinophils Absolute 0.0  0.0 - 0.7 K/uL   Basophils Relative 0  0 - 1 %   Basophils Absolute 0.0  0.0 - 0.1 K/uL  BASIC METABOLIC PANEL      Result Value Ref Range   Sodium 141  137 - 147 mEq/L   Potassium 3.8  3.7 - 5.3 mEq/L   Chloride 102  96 - 112 mEq/L   CO2 24  19 - 32 mEq/L   Glucose, Bld 86  70 - 99 mg/dL   BUN 12  6 - 23 mg/dL   Creatinine, Ser 0.94  0.50 - 1.35 mg/dL   Calcium 9.5  8.4 - 10.5 mg/dL   GFR calc non Af Amer >90  >90 mL/min   GFR calc Af Amer >90  >90 mL/min  HEPATIC FUNCTION PANEL      Result Value Ref Range   Total Protein 7.7  6.0 - 8.3 g/dL   Albumin 4.1  3.5 - 5.2 g/dL   AST 23  0 - 37 U/L   ALT 13  0 - 53 U/L   Alkaline Phosphatase 66  39 - 117 U/L   Total Bilirubin 0.3  0.3 - 1.2 mg/dL   Bilirubin, Direct <0.2  0.0 - 0.3 mg/dL   Indirect Bilirubin NOT CALCULATED  0.3 - 0.9 mg/dL  LIPASE, BLOOD      Result Value Ref Range   Lipase 24  11 - 59 U/L   Dg Chest 2 View 09/13/2013   CLINICAL DATA:  Right upper quadrant pain.  EXAM: CHEST  2 VIEW  COMPARISON:  Chest x-ray dated 11/11/2012  FINDINGS: The heart size and mediastinal contours are within normal limits. Both lungs are clear. The visualized skeletal structures are unremarkable.  IMPRESSION: Normal chest.  No free air under the diaphragm.   Electronically Signed   By: Rozetta Nunnery M.D.   On: 09/13/2013 18:21   Ct Abdomen Pelvis W Contrast 09/13/2013   CLINICAL DATA:  Right upper quadrant pain.  EXAM: CT ABDOMEN AND PELVIS WITH CONTRAST  TECHNIQUE: Multidetector CT imaging of the abdomen and pelvis was performed using the standard protocol following bolus administration of  intravenous contrast.  CONTRAST:  50 mL OMNIPAQUE IOHEXOL 300 MG/ML SOLN, 133m OMNIPAQUE IOHEXOL 300 MG/ML SOLN  COMPARISON:  CT abdomen and pelvis 08/11/2012.  FINDINGS: Lung bases are clear.  No pleural or pericardial effusion.  The gallbladder, liver, spleen, adrenal glands, pancreas and kidneys all appear normal. The stomach, small and large bowel and appendix appear normal. There is no lymphadenopathy or fluid. No bony abnormality is identified.  IMPRESSION: Negative abdomen and pelvis CT scan.   Electronically Signed   By: TInge RiseM.D.   On: 09/13/2013 18:50    1945:  Pt has tol PO well while in the ED without N/V.  No stooling while in the ED.  Workup reassuring. Family wants to take pt home now. Dx and testing d/w pt and family.  Questions answered.  Verb understanding, agreeable to d/c home with outpt f/u.      KAlfonzo Feller DO 09/16/13 1121

## 2013-09-13 NOTE — Discharge Instructions (Signed)
Emergency Department Resource Guide 1) Find a Doctor and Pay Out of Pocket Although you won't have to find out who is covered by your insurance plan, it is a good idea to ask around and get recommendations. You will then need to call the office and see if the doctor you have chosen will accept you as a new patient and what types of options they offer for patients who are self-pay. Some doctors offer discounts or will set up payment plans for their patients who do not have insurance, but you will need to ask so you aren't surprised when you get to your appointment.  2) Contact Your Local Health Department Not all health departments have doctors that can see patients for sick visits, but many do, so it is worth a call to see if yours does. If you don't know where your local health department is, you can check in your phone book. The CDC also has a tool to help you locate your state's health department, and many state websites also have listings of all of their local health departments.  3) Find a [facility] If your illness is not likely to be very severe or complicated, you may want to try a walk in clinic. These are popping up all over the country in pharmacies, drugstores, and shopping centers. They're usually staffed by nurse practitioners or physician assistants that have been trained to treat common illnesses and complaints. They're usually fairly quick and inexpensive. However, if you have serious medical issues or chronic medical problems, these are probably not your best option.  No Primary Care Doctor: - Call Health Connect at  [phone] - they can help you locate a primary care doctor that  accepts your insurance, provides certain services, etc. - Physician Referral Service- [phone]  Chronic Pain Problems: Organization         Address  Phone   Notes  [facility]  [phone] Patients need to be referred by their primary care doctor.   Medication  Assistance: Organization         Address  Phone   Notes  [facility] Medication [facility] [address] [place], [place] [address] [phone] --Must be a resident of [facility] -- Must have NO insurance coverage whatsoever (no Medicaid/ Medicare, etc.) -- The pt. MUST have a primary care doctor that directs their care regularly and follows them in the community   [facility]  [phone]   [employer]  [phone]    Agencies that provide inexpensive medical care: Organization         Address  Phone   Notes  [place]  [phone]   [facility]    [phone]   [facility] [place], [place] [address] [phone]   [place] [address] [place] [phone]   Planned Parenthood    [phone]   [facility]    [phone]   [place] and [place], [place] Phone:  [phone], Fax:  [phone] Hours of Operation:  9 am - 6 pm, M-F.  Also accepts Medicaid/Medicare and self-pay.  [facility] [place] [address], [address], [place] Phone: [phone], Fax: [phone]. Hours of Operation:  8:30 am - 5:30 pm, M-F.  Also accepts Medicaid and self-pay.  [facility] [address], [place] Phone: [phone]   [place], [place], [place] [phone], Ext. [phone] Mondays & Thursdays: 7-9 AM.  First 15 patients are seen on a first come, first serve basis.    [place] Providers:  Organization         Address  Phone   Notes  [facility] [address], [place] [phone] Also accepts self-pay patients.  [facility] [address], [place]  [phone]   [place], [address], [place]  [phone]   [facility] [address], [place] [phone]   Lucianne Lei [address], [place]   [phone] Only accepts [place] Access [place] patients after they have their name applied to their card.   Self-Pay (no insurance) in [facility]:  Organization         Address  Phone   Notes  Sickle Cell Patients, [facility] [place] [phone]   [facility] [place] [phone]   [facility] [place]  [place], [place], [place] [phone]   [facility]/Dr. Osei-Bonsu  [address], [place] or [address], [place] [phone] Phone number for both [place] and [place] locations is the same.  [facility] [address] [place] [phone]   [facility] [address] [place] or [address] [phone]   [facility] [address], [place] [phone], phone; [phone], fax Sees patients 1st and 3rd Saturday of every month.  Must not qualify for public or private insurance (i.e. Medicaid, Medicare, [place] Health Choice, Veterans' Benefits)  Household income should be no more than 200% of the poverty level The clinic cannot treat you if you are pregnant or think you are pregnant  Sexually transmitted diseases are not treated at the clinic.    Dental Care: Organization         Address  Phone  Notes  [facility] Department of [facility] [place] [phone] Accepts children up to age 54 who are enrolled in [place] or [place]; pregnant women with a Medicaid card; and children who have applied for Medicaid or [place] Health Choice, but were declined, whose parents can pay a reduced fee at time of service.  [facility]  Department of [facility]  [address], [place] [phone] Accepts children up to age 41 who are enrolled in [place] or [place]; pregnant women with a Medicaid card; and children who have applied for Medicaid or [place] Health Choice, but were declined, whose parents can pay a reduced fee at time of service.  [place] Adult [facility] Access PROGRAM  [place] [phone] Patients are seen by appointment only. Walk-ins are not accepted. [place] will see patients 58 years of age and older. Monday - Tuesday (8am-5pm) Most Wednesdays (8:30-5pm) $30 per visit, cash only  [facility] Adult Dental Access PROGRAM  [address], [facility] [phone] Patients are seen by appointment only. Walk-ins are not accepted. [place] will see patients 33 years of age and older. One  Wednesday Evening (Monthly: Volunteer Based).  $30 per visit, cash only  [facility]ia Lopez de [facility]ierrez  [phone] for adults; Children under age 76, call Graduate Pediatric Dentistry at [phone]. Children aged 79-14, please call [phone] to request a pediatric application.  Dental services are provided in all areas of dental care including fillings, crowns and bridges, complete and partial dentures, implants, gum treatment, root canals, and extractions. Preventive care is also provided. Treatment is provided to both adults and children. Patients are selected via a lottery and there is often a waiting list.   [facility] [address] [place]  [phone] [email or web address]   [facility] [address] [place], [place] [phone], Ext. [phone] Second and Fourth Thursday of each month, opens at 6:30 AM; Clinic ends at 9 AM.  Patients are seen on a first-come first-served basis, and a limited number are seen during each clinic.   [facility]  [address] Hillard Danker [place], [place] [phone]    Eligibility Requirements You must have lived in [place], [place], or [place] counties for at least the last three months.   You cannot be eligible for state or federal sponsored [employer], including [employer], [place], or [employer].   You generally cannot be eligible for healthcare insurance through your employer.    How to apply: Eligibility screenings are held every Tuesday and Wednesday afternoon from 1:00 pm until 4:00 pm. You do not need an appointment for the interview!  [facility] [address], [place], [place]   [place]  [place] Department  [place]  [phone]    Behavioral Health Resources in the Community: Intensive Outpatient Programs Organization         Address  Phone  Notes  [place]. [address] [place], [place] [phone]   [facility] Outpatient [address] [place], [place]   ADS: Alcohol & Drug S[facility] [address] [place], [place]   [place] [address]  [place], [place] or [phone]   Substance Abuse Resources Organization         Address  Phone  Notes  Alcohol and Drug Services  [phone]   [place]  [phone]   [facility]   Chinita Pester  [phone]   Residential & Outpatient Substance Abuse Program  [phone]   Psychological Services Organization         Address  Phone  Notes  [facility] [place]  [place]  [phone]   [place] [address] [place] or [phone]    Mobile Crisis Teams Organization         Address  Phone  Notes  [facility], Mobile Crisis Care Unit  [phone]   [facility] Psychotherapeutic Services  [address]  [place], [place]   [place] [address], [place] [phone]    Self-Help/Support Groups Organization         Address  Phone             Notes  [place]. of [place] - variety of support groups  Page Call for more information  Narcotics Anonymous (NA), [facility] [address], [employer] [place]  2 meetings at this location  Residential Treatment Programs Organization         Address  Phone  Notes  [facility] [address]    [place]  [phone]   [facility]  [address] [place] [id number], [place], [place]   [place], [place] [phone] Admissions: 8am-3pm M-F  Incentives Substance [place] [address]    [place], [place] [phone]   [facility] [address] [place], [place], [place]   [facility] [address],  [place], [place]   [facility] - Intensive Outpatient [address], [place], [place]   [facility] (Amanda.) [address].,  [place], [place] [phone] or [phone]   [facility] ([facility]) [address] [place], [place] Accepts Medicaid  [facility] [address],  [place] [phone] Substance Abuse/Addiction Treatment   [facility] Organization         Address  Phone  Notes  [facility]  [phone]   Domenic Schwab, PhD [address] Arlis Porta [place], [place]   [phone] or [phone]   [place], [place] [phone]   [facility] [address] [place], [place] [phone] Insurance/Medicaid/sponsorship through [facility] and Families [address]                                    [place], [place] [phone] [facility] [address], [place] [phone]    Dr. Adele Schilder  [phone]   [facility] [place] Dept. 1) [address] [place] 2) [place] 3)  [place] [address], [place] [phone]  [phone]   [place] [phone] or [phone] (After Hours)       Eat a bland diet, avoiding greasy, fatty, fried foods, as well as spicy and acidic foods or beverages.  Avoid eating within the hour or 2 before going to bed or laying down.  Also avoid teas, colas, coffee, chocolate, pepermint and spearment.  Take over the counter pepcid, one tablet by mouth twice a day, for the next 2 to 3 weeks.  May also take over the counter maalox/mylanta, as directed on packaging, as needed for discomfort.  Take the prescription as directed.  Call your regular GI doctor tomorrow to schedule a follow up appointment in the next 2 days.  Return to the Emergency Department immediately if worsening.

## 2013-09-13 NOTE — ED Notes (Signed)
RUQ pain starting today.  Denies n/v/d.

## 2013-09-14 ENCOUNTER — Telehealth: Payer: Self-pay | Admitting: *Deleted

## 2013-09-14 NOTE — Telephone Encounter (Signed)
I called and informed pt's mom, Wayne Avila. She said that lab was already drawn.  She was informed of the other info.  I called [facility] and spoke to Efrain, who said results were faxed today, but he checked and it did not go through.  He will resend. Received and placed on Dr. Nona Dell desk.

## 2013-09-14 NOTE — Telephone Encounter (Signed)
PLEASE CALL PT. HE HAS ONLY BEEN ON PREDNISONE FOR 7 DAYS SO HE'S GOING TO HAVE ABDOMINAL PAIN BECAUSE HE HAS COLITIS. HE SHOULD CONTINUE HIS STEROID TAPER.  HE SHOULD GET HIS BLOOD DRAWN SO WE CAN DETERMEIN WHAT TYPE OF INFLAMMATORY BOWEL DISEASE HE HAS AND PLACE HIM OF TREATMENT TO IMPROVE HIS SYMPTOMS.

## 2013-09-14 NOTE — Telephone Encounter (Signed)
To Dr. [name].

## 2013-09-14 NOTE — Telephone Encounter (Signed)
REVIEWED PATH WITH DR. Nicoletta Dress. DEFINITELY IBD BUT NO FEATURES TO DELINEATE BETWEEN CROHN'S AND UC.

## 2013-09-14 NOTE — Telephone Encounter (Signed)
Routing to Dr. [name] for recommendation.

## 2013-09-14 NOTE — Telephone Encounter (Signed)
Pt's mother called stating pt was seen in the ER yesterday for abd. Pain and pt has chrones disease the ER told pt he needs to be seen here in the next 2-3 days so he can be checked out, pt has a appointment 10/25/13 and I told mother other than his appointment the next available is 10/04/13 this month, mother does not want to wait that long because she said the ER told them that pt needs to be seen in 2-3 days Pt's mtoher said pt went to the [place]. Please Advise

## 2013-09-15 NOTE — Telephone Encounter (Signed)
Per Ginger, pt's mom called and asked about the [facility] lab today.

## 2013-09-16 MED ORDER — MESALAMINE 400 MG PO CPDR: 800.0000 mg | DELAYED_RELEASE_CAPSULE | Freq: Three times a day (TID) | ORAL | Status: DC

## 2013-09-16 NOTE — Telephone Encounter (Signed)
Pt's mom called and said she was returning Dr. Nona Dell phone call to her yesterday. I told her that Dr. [name] is at the hospital doing procedures and I will let her know.

## 2013-09-16 NOTE — Addendum Note (Signed)
Addended by: [name] on: 09/16/2013 04:14 PM   Modules accepted: Orders

## 2013-09-16 NOTE — Telephone Encounter (Addendum)
CALLED PT'S mother. DISCUSSed  Path and blood test RESULTS. PT HAS UC. NEEDS MESALAMINE. HE HAS MILDLY ACTIVE DISEASE BUT PREDNISONE AND MESALAMINE SHOULD GET HIS DISEASE UNDER CONTROL. RX SENT TO [facility]. [facility]PV IN 2 MOS E30 UC

## 2013-09-19 NOTE — Telephone Encounter (Signed)
Pt has OV 5/19 at 1130 with LSL

## 2013-09-20 ENCOUNTER — Emergency Department (HOSPITAL_COMMUNITY)
Admission: EM | Admit: 2013-09-20 | Discharge: 2013-09-20 | Disposition: A | Discharge status: Home / Self Care | Payer: Medicaid Other | Attending: Emergency Medicine | Admitting: Emergency Medicine

## 2013-09-20 ENCOUNTER — Encounter (HOSPITAL_COMMUNITY): Payer: Self-pay | Admitting: Emergency Medicine

## 2013-09-20 ENCOUNTER — Encounter: Payer: Self-pay | Admitting: Family Medicine

## 2013-09-20 ENCOUNTER — Ambulatory Visit (INDEPENDENT_AMBULATORY_CARE_PROVIDER_SITE_OTHER): Payer: Medicaid Other | Admitting: Family Medicine

## 2013-09-20 ENCOUNTER — Emergency Department (HOSPITAL_COMMUNITY): Payer: Medicaid Other

## 2013-09-20 VITALS — BP 118/70 | HR 78 | Temp 98.0°F | Resp 18 | Ht 67.0 in | Wt 179.0 lb

## 2013-09-20 DIAGNOSIS — R1011 Right upper quadrant pain: Secondary | ICD-10-CM | POA: Insufficient documentation

## 2013-09-20 DIAGNOSIS — R109 Unspecified abdominal pain: Secondary | ICD-10-CM

## 2013-09-20 DIAGNOSIS — F909 Attention-deficit hyperactivity disorder, unspecified type: Secondary | ICD-10-CM | POA: Insufficient documentation

## 2013-09-20 DIAGNOSIS — Z880 Allergy status to penicillin: Secondary | ICD-10-CM | POA: Insufficient documentation

## 2013-09-20 DIAGNOSIS — F329 Major depressive disorder, single episode, unspecified: Secondary | ICD-10-CM | POA: Insufficient documentation

## 2013-09-20 DIAGNOSIS — Z8669 Personal history of other diseases of the nervous system and sense organs: Secondary | ICD-10-CM | POA: Insufficient documentation

## 2013-09-20 DIAGNOSIS — Z8719 Personal history of other diseases of the digestive system: Secondary | ICD-10-CM | POA: Insufficient documentation

## 2013-09-20 DIAGNOSIS — F3289 Other specified depressive episodes: Secondary | ICD-10-CM | POA: Insufficient documentation

## 2013-09-20 DIAGNOSIS — Z87891 Personal history of nicotine dependence: Secondary | ICD-10-CM | POA: Insufficient documentation

## 2013-09-20 DIAGNOSIS — F411 Generalized anxiety disorder: Secondary | ICD-10-CM | POA: Insufficient documentation

## 2013-09-20 DIAGNOSIS — G8929 Other chronic pain: Secondary | ICD-10-CM | POA: Insufficient documentation

## 2013-09-20 DIAGNOSIS — R52 Pain, unspecified: Secondary | ICD-10-CM

## 2013-09-20 DIAGNOSIS — J45909 Unspecified asthma, uncomplicated: Secondary | ICD-10-CM | POA: Insufficient documentation

## 2013-09-20 DIAGNOSIS — K5090 Crohn's disease, unspecified, without complications: Secondary | ICD-10-CM | POA: Insufficient documentation

## 2013-09-20 DIAGNOSIS — F319 Bipolar disorder, unspecified: Secondary | ICD-10-CM | POA: Insufficient documentation

## 2013-09-20 LAB — CBC WITH DIFFERENTIAL/PLATELET
Basophils Absolute: 0 10*3/uL (ref 0.0–0.1)
Basophils Relative: 0 % (ref 0–1)
Eosinophils Absolute: 0 10*3/uL (ref 0.0–0.7)
Eosinophils Relative: 0 % (ref 0–5)
HCT: 41.9 % (ref 39.0–52.0)
Hemoglobin: 14.7 g/dL (ref 13.0–17.0)
Lymphocytes Relative: 6 % — ABNORMAL LOW (ref 12–46)
Lymphs Abs: 0.8 10*3/uL (ref 0.7–4.0)
MCH: 28.8 pg (ref 26.0–34.0)
MCHC: 35.1 g/dL (ref 30.0–36.0)
MCV: 82.2 fL (ref 78.0–100.0)
Monocytes Absolute: 0.5 10*3/uL (ref 0.1–1.0)
Monocytes Relative: 4 % (ref 3–12)
Neutro Abs: 12.2 10*3/uL — ABNORMAL HIGH (ref 1.7–7.7)
Neutrophils Relative %: 90 % — ABNORMAL HIGH (ref 43–77)
Platelets: 264 10*3/uL (ref 150–400)
RBC: 5.1 MIL/uL (ref 4.22–5.81)
RDW: 13 % (ref 11.5–15.5)
WBC: 13.5 10*3/uL — ABNORMAL HIGH (ref 4.0–10.5)

## 2013-09-20 LAB — COMPREHENSIVE METABOLIC PANEL
ALT: 10 U/L (ref 0–53)
AST: 16 U/L (ref 0–37)
Albumin: 3.9 g/dL (ref 3.5–5.2)
Alkaline Phosphatase: 70 U/L (ref 39–117)
BUN: 12 mg/dL (ref 6–23)
CO2: 25 mEq/L (ref 19–32)
Calcium: 9.9 mg/dL (ref 8.4–10.5)
Chloride: 103 mEq/L (ref 96–112)
Creatinine, Ser: 0.87 mg/dL (ref 0.50–1.35)
GFR calc Af Amer: >90 mL/min (ref >90)
GFR calc non Af Amer: >90 mL/min (ref >90)
Glucose, Bld: 119 mg/dL — ABNORMAL HIGH (ref 70–99)
Potassium: 4.5 mEq/L (ref 3.7–5.3)
Sodium: 140 mEq/L (ref 137–147)
Total Bilirubin: 0.2 mg/dL — ABNORMAL LOW (ref 0.3–1.2)
Total Protein: 7.4 g/dL (ref 6.0–8.3)

## 2013-09-20 LAB — LIPASE, BLOOD: Lipase: 25 U/L (ref 11–59)

## 2013-09-20 MED ORDER — PROMETHAZINE HCL 25 MG PO TABS: 25.0000 mg | ORAL_TABLET | Freq: Four times a day (QID) | ORAL | Status: DC | PRN

## 2013-09-20 MED ORDER — MORPHINE SULFATE 4 MG/ML IJ SOLN
4.0000 mg | Freq: Once | INTRAMUSCULAR | Status: AC
  Administered 2013-09-20: 4 mg via INTRAVENOUS
  Filled 2013-09-20: qty 1

## 2013-09-20 MED ORDER — SODIUM CHLORIDE 0.9 % IV BOLUS (SEPSIS)
1000.0000 mL | Freq: Once | INTRAVENOUS | Status: AC
  Administered 2013-09-20: 1000 mL via INTRAVENOUS

## 2013-09-20 MED ORDER — ONDANSETRON HCL 4 MG/2ML IJ SOLN
4.0000 mg | Freq: Once | INTRAMUSCULAR | Status: AC
  Administered 2013-09-20: 4 mg via INTRAVENOUS
  Filled 2013-09-20: qty 2

## 2013-09-20 MED ORDER — OXYCODONE-ACETAMINOPHEN 5-325 MG PO TABS: 2.0000 | ORAL_TABLET | ORAL | Status: DC | PRN

## 2013-09-20 NOTE — ED Notes (Signed)
Pt reports was diagnosed with crohn's disease and colitis.  Reports went to pcp today for abd pain and asthma.  Sent here for further evaluation.  Reports vomited x 1 last night.  Pt doesn't remember when LBM was.

## 2013-09-20 NOTE — Progress Notes (Signed)
Subjective:     Wayne Avila is a 23 y.o. male who presents for evaluation of abdominal pain. Onset was 1 day ago. Symptoms have been rapidly worsening. The pain is described as colicky and sharp, and is 9/10 in intensity. Pain is located in the RUQ and RLQ without radiation.  Aggravating factors: none.  Alleviating factors: none. Associated symptoms: mother says he had chest discomfort and mom says he told her he coughed up some blood.The patient denies arthralagias, constipation, diarrhea, dysuria, fever, headache, hematemesis, and hematochezia. Mother does state that he is established with Dr. [name] with GI and that he was recently diagnosed with Crohn's disease 2 weeks ago. She says he is suppose to be on a medicine for this but they are still waiting for insurance approval for the medicine. He is taking Prednisone 20 mg daily now.   Past Medical History  Diagnosis Date  . Asthma   . ADHD (attention deficit hyperactivity disorder)   . Bipolar 1 disorder   . GERD (gastroesophageal reflux disease)   . Depression   . Anxiety   . Mental developmental delay 11/15/2012  . PONV (postoperative nausea and vomiting)   . Hearing loss   . HOH (hard of hearing)   . Abdominal pain, chronic, right upper quadrant   . Colitis     possible Crohn's, seen on colonoscopy 08/30/2013   Past Surgical History  Procedure Laterality Date  . Tonsillectomy    . Adenoidectomy    . Esophagogastroduodenoscopy (egd) with propofol N/A 12/07/2012    SLF:The mucosa of the esophagus appeared normal Non-erosive gastritis (inflammation) was found in the gastric antrum; multiple biopsies The duodenal mucosa showed no abnormalities in the bulb and second portion of the duodenum  . Esophageal biopsy N/A 12/07/2012    Procedure: GASTRIC BIOPSIES;  Surgeon: [name], MD;  Location: [facility];  Service: Endoscopy;  Laterality: N/A;  . Colonoscopy with propofol N/A 08/30/2013    Procedure: COLONOSCOPY WITH PROPOFOL;  Surgeon:  [name], MD;  Location: [facility];  Service: Endoscopy;  Laterality: N/A;  entered cecum @ 0753; cecal withdrawal time- 20 minutes  . Esophageal biopsy N/A 08/30/2013    Procedure: BIOPSY;  Surgeon: [name], MD;  Location: [facility];  Service: Endoscopy;  Laterality: N/A;  right colon,transverse colon, left colon, rectal biopsies   Family History  Problem Relation Age of Onset  . Asthma Mother   . Ulcers Mother   . Bipolar disorder Mother   . Colon cancer Neg Hx   . Liver disease Neg Hx   . ADD / ADHD Father   . Diabetes Maternal Grandmother   . Diabetes Maternal Grandfather    History   Social History  . Marital Status: Single    Spouse Name: N/A    Number of Children: 0  . Years of Education: 12th   Occupational History  .      not working   Social History Main Topics  . Smoking status: Former Smoker -- 1.00 packs/day for 2 years    Types: Cigarettes    Quit date: 10/06/2012  . Smokeless tobacco: Never Used  . Alcohol Use: No  . Drug Use: No  . Sexual Activity: Yes   Other Topics Concern  . Not on file   Social History Narrative   Patient lives at home with girlfriend and family.   Caffeine Use: occasionally    Review of Systems Pertinent items are noted in HPI.  Objective:    BP-118/70  Temp 98  Height-5'7" Weight 179 lbs Pulse- 78  Resp-18  SaO2-98% General appearance: alert, cooperative and mild distress Head: Normocephalic, without obvious abnormality, atraumatic Eyes: conjunctivae/corneas clear. PERRL, EOM's intact. Fundi benign. Ears: normal TM's and external ear canals both ears Nose: Nares normal. Septum midline. Mucosa normal. No drainage or sinus tenderness. Throat: lips, mucosa, and tongue normal; teeth and gums normal Lungs: clear to auscultation bilaterally, normal percussion bilaterally and no chest wall tenderness to palpation Heart: regular rate and rhythm and S1, S2 normal Abdomen: Decrease bowel sounds with guarding     Assessment:    Abdominal pain, likely secondary to Crohn's disease flare .    Plan:    Referral to ED for ED evaluation with CT scan of abdomen and pelvis.  Concern for Crohn's disease flare and/or complication. Ruble is with abdominal pain, decrease bowel sounds and guarding. They are still unable to get the medicine he should be on for weeks now. Explained to mother the need to present to [facility] ED now and possible GI consult pending on what CT scan shows and if pain can be controlled.

## 2013-09-20 NOTE — ED Provider Notes (Signed)
CSN: [id number]     Arrival date & time 09/20/13  1017 History  This chart was scribed for Nat Christen, MD by Roxan Diesel, ED scribe.  This patient was seen in room [facility] and the patient's care was started at 10:53 AM.    Chief Complaint  Patient presents with  . Abdominal Pain    The history is provided by the patient. No language interpreter was used.    HPI Comments: Wayne Avila is a 23 y.o. male recently diagnosed with Crohn's disease and colitis who presents to the Emergency Department complaining of severe, progressively-worsening RUQ abdominal pain that began yesterday.  Pt's mother states that he was hospitalized several weeks ago and diagnosed with with Crohn's disease and colitis.  He has continued to have pain recurrently since then.  He has been on steroids for approximately 2 weeks but has another medication which mother states she "needs authorization from the doctor" in order to pick up.  His current episode began yesterday and worsened last night.  He went to his PCP today and was sent here for further evaluation.  Mother states pt will be seeing a GI (Dr. [name]) but has not been seen yet.  PCP is Dr. Lorra Hals   Past Medical History  Diagnosis Date  . Asthma   . ADHD (attention deficit hyperactivity disorder)   . Bipolar 1 disorder   . GERD (gastroesophageal reflux disease)   . Depression   . Anxiety   . Mental developmental delay 11/15/2012  . PONV (postoperative nausea and vomiting)   . Hearing loss   . HOH (hard of hearing)   . Abdominal pain, chronic, right upper quadrant   . Colitis     possible Crohn's, seen on colonoscopy 08/30/2013    Past Surgical History  Procedure Laterality Date  . Tonsillectomy    . Adenoidectomy    . Esophagogastroduodenoscopy (egd) with propofol N/A 12/07/2012    SLF:The mucosa of the esophagus appeared normal Non-erosive gastritis (inflammation) was found in the gastric antrum; multiple biopsies The duodenal mucosa showed  no abnormalities in the bulb and second portion of the duodenum  . Esophageal biopsy N/A 12/07/2012    Procedure: GASTRIC BIOPSIES;  Surgeon: [name], MD;  Location: [facility];  Service: Endoscopy;  Laterality: N/A;  . Colonoscopy with propofol N/A 08/30/2013    Procedure: COLONOSCOPY WITH PROPOFOL;  Surgeon: [name], MD;  Location: [facility];  Service: Endoscopy;  Laterality: N/A;  entered cecum @ 0753; cecal withdrawal time- 20 minutes  . Esophageal biopsy N/A 08/30/2013    Procedure: BIOPSY;  Surgeon: [name], MD;  Location: [facility];  Service: Endoscopy;  Laterality: N/A;  right colon,transverse colon, left colon, rectal biopsies    Family History  Problem Relation Age of Onset  . Asthma Mother   . Ulcers Mother   . Bipolar disorder Mother   . Colon cancer Neg Hx   . Liver disease Neg Hx   . ADD / ADHD Father   . Diabetes Maternal Grandmother   . Diabetes Maternal Grandfather     History  Substance Use Topics  . Smoking status: Former Smoker -- 1.00 packs/day for 2 years    Types: Cigarettes    Quit date: 10/06/2012  . Smokeless tobacco: Never Used  . Alcohol Use: No     Review of Systems A complete 10 system review of systems was obtained and all systems are negative except as noted in the HPI and PMH.  Allergies  Amoxicillin-pot clavulanate; Omeprazole; Pineapple; Strawberry; and Tomato  Home Medications   Prior to Admission medications   Medication Sig Start Date End Date Taking? Authorizing Provider  albuterol (PROAIR HFA) 108 (90 BASE) MCG/ACT inhaler Inhale 2 puffs into the lungs every 4 (four) hours as needed for wheezing. Persistent dry cough    Historical Provider, MD  albuterol (PROVENTIL) (2.5 MG/3ML) 0.083% nebulizer solution Take 2.5 mg by nebulization every 6 (six) hours as needed. For shortness of breath    Historical Provider, MD  atomoxetine (STRATTERA) 100 MG capsule Take 100 mg by mouth every morning.     Historical Provider, MD   EPINEPHrine (EPIPEN) 0.3 mg/0.3 mL DEVI Inject 0.3 mLs (0.3 mg total) into the muscle once. 11/15/12   Garvin Fila, MD  loratadine (CLARITIN) 10 MG tablet Take 10 mg by mouth at bedtime.    Historical Provider, MD  Mesalamine (ASACOL) 400 MG CPDR DR capsule Take 2 capsules (800 mg total) by mouth 3 (three) times daily. FOR 6 WEEKS THEN 2 PO BID 09/16/13   [name], MD  methylphenidate (CONCERTA) 54 MG CR tablet Take 54 mg by mouth every morning.    Historical Provider, MD  montelukast (SINGULAIR) 10 MG tablet Take 10 mg by mouth at bedtime.    Historical Provider, MD  ondansetron (ZOFRAN) 4 MG tablet Take 1 tablet (4 mg total) by mouth every 8 (eight) hours as needed for nausea or vomiting. 09/13/13   Alfonzo Feller, DO  predniSONE (DELTASONE) 10 MG tablet 2 PO QD FOR 14 DAYS THEN  THEN 1 PO QD FOR 14 DAYS 09/06/13   [name], MD  sertraline (ZOLOFT) 50 MG tablet Take 50 mg by mouth daily.    Historical Provider, MD  ziprasidone (GEODON) 40 MG capsule Take 40 mg by mouth at bedtime.    Historical Provider, MD   BP 154/94  Pulse 69  Temp(Src) 98.5 F (36.9 C) (Oral)  Resp 20  Ht 5' 7"  (1.702 m)  Wt 180 lb (81.647 kg)  BMI 28.19 kg/m2  SpO2 99%   Physical Exam  Nursing note and vitals reviewed. Constitutional: He is oriented to person, place, and time. He appears well-developed and well-nourished.  HENT:  Head: Normocephalic and atraumatic.  Eyes: Conjunctivae and EOM are normal. Pupils are equal, round, and reactive to light.  Neck: Normal range of motion. Neck supple.  Cardiovascular: Normal rate, regular rhythm and normal heart sounds.   Pulmonary/Chest: Effort normal and breath sounds normal.  Abdominal: Soft. Bowel sounds are normal. There is tenderness (RUQ).  Musculoskeletal: Normal range of motion.  Neurological: He is alert and oriented to person, place, and time.  Skin: Skin is warm and dry.  Psychiatric: He has a normal mood and affect. His behavior is  normal.    ED Course  Procedures (including critical care time)  DIAGNOSTIC STUDIES: Oxygen Saturation is 99% on room air, normal by my interpretation.    COORDINATION OF CARE: 10:57 AM-Discussed treatment plan which includes pain medication, labs, and records review with pt at bedside and pt agreed to plan.     Labs Review Labs Reviewed  COMPREHENSIVE METABOLIC PANEL - Abnormal; Notable for the following:    Glucose, Bld 119 (*)    Total Bilirubin 0.2 (*)    All other components within normal limits  CBC WITH DIFFERENTIAL - Abnormal; Notable for the following:    WBC 13.5 (*)    Neutrophils Relative % 90 (*)  Neutro Abs 12.2 (*)    Lymphocytes Relative 6 (*)    All other components within normal limits  LIPASE, BLOOD    Imaging Review Dg Abd Acute W/chest  09/20/2013   CLINICAL DATA:  RUQ pain;  h/o Crohn's  EXAM: ACUTE ABDOMEN SERIES (ABDOMEN 2 VIEW & CHEST 1 VIEW)  COMPARISON:  None.  FINDINGS: There is no evidence of dilated bowel loops or free intraperitoneal air. No radiopaque calculi or other significant radiographic abnormality is seen. Heart size and mediastinal contours are within normal limits. Both lungs are clear. Moderate amount of stool within the colon.  IMPRESSION: Negative abdominal radiographs. No acute cardiopulmonary disease. Moderate amount of fecal retention.   Electronically Signed   By: Margaree Mackintosh M.D.   On: 09/20/2013 11:55     EKG Interpretation None      MDM   Final diagnoses:  Abdominal pain    No acute abdomen. CT scan abdomen and pelvis reviewed from 09/13/2013.   He feels better after IV fluids and pain management. Discharge medications Percocet and Phenergan 25 mg    I personally performed the services described in this documentation, which was scribed in my presence. The recorded information has been reviewed and is accurate.    Nat Christen, MD 09/20/13 [phone]

## 2013-09-20 NOTE — Patient Instructions (Addendum)
Crohn's Disease Crohn's disease is a long-term (chronic) soreness and redness (inflammation) of the intestines (bowel). It can affect any portion of the digestive tract, from the mouth to the anus. It can also cause problems outside the digestive tract. Crohn's disease is closely related to a disease called ulcerative colitis (together, these two diseases are called inflammatory bowel disease).  CAUSES  The cause of Crohn's disease is not known. One Link Snuffer is that, in an easily affected person, the immune system is triggered to attack the body's own digestive tissue. Crohn's disease runs in families. It seems to be more common in certain geographic areas and amongst certain races. There are no clear-cut dietary causes.  SYMPTOMS  Crohn's disease can cause many different symptoms since it can affect many different parts of the body. Symptoms include:  Fatigue.  Weight loss.  Chronic diarrhea, sometime bloody.  Abdominal pain and cramps.  Fever.  Ulcers or canker sores in the mouth or rectum.  Anemia (low red blood cells).  Arthritis, skin problems, and eye problems may occur. Complications of Crohn's disease can include:  Series of holes (perforation) of the bowel.  Portions of the intestines sticking to each other (adhesions).  Obstruction of the bowel.  Fistula formation, typically in the rectal area but also in other areas. A fistula is an opening between the bowels and the outside, or between the bowels and another organ.  A painful crack in the mucous membrane of the anus (rectal fissure). DIAGNOSIS  Your caregiver may suspect Crohn's disease based on your symptoms and an exam. Blood tests may confirm that there is a problem. You may be asked to submit a stool specimen for examination. X-rays and CT scans may be necessary. Ultimately, the diagnosis is usually made after a procedure that uses a flexible tube that is inserted via your mouth or your anus. This is done under  sedation and is called either an upper endoscopy or colonoscopy. With these tests, the specialist can take tiny tissue samples and remove them from the inside of the bowel (biopsy). Examination of this biopsy tissue under a microscope can reveal Crohn's disease as the cause of your symptoms. Due to the many different forms that Crohn's disease can take, symptoms may be present for several years before a diagnosis is made. TREATMENT  Medications are often used to decrease inflammation and control the immune system. These include medicines related to aspirin, steroid medications, and newer and stronger medications to slow down the immune system. Some medications may be used as suppositories or enemas. A number of other medications are used or have been studied. Your caregiver will make specific recommendations. HOME CARE INSTRUCTIONS   Symptoms such as diarrhea can be controlled with medications. Avoid foods that have a laxative effect such as fresh fruit, vegetables and dairy products. During flare ups, you can rest your bowel by refraining from solid foods. Drink clear liquids frequently during the day (electrolyte or re-hydrating fluids are best. Your caregiver can help you with suggestions). Drink often to prevent loss of body fluids (dehydration). When diarrhea has cleared, eat small meals and more frequently. Avoid food additives and stimulants such as caffeine (coffee, tea, or chocolate). Enzyme supplements may help if you develop intolerance to a sugar in dairy products (lactose). Ask your caregiver or dietitian about specific dietary instructions.  Try to maintain a positive attitude. Learn relaxation techniques such as self hypnosis, mental imaging, and muscle relaxation.  If possible, avoid stresses which can aggravate your condition.  Exercise regularly.  Follow your diet.  Always get plenty of rest. SEEK MEDICAL CARE IF:   Your symptoms fail to improve after a week or two of new  treatment.  You experience continued weight loss.  You have ongoing cramps or loose bowels.  You develop a new skin rash, skin sores, or eye problems. SEEK IMMEDIATE MEDICAL CARE IF:   You have worsening of your symptoms or develop new symptoms.  You have a fever.  You develop bloody diarrhea.  You develop severe abdominal pain. MAKE SURE YOU:   Understand these instructions.  Will watch your condition.  Will get help right away if you are not doing well or get worse. Document Released: 03/05/2005 Document Revised: 09/20/2012 Document Reviewed: 02/01/2007 [facility] Patient Information 2014 [place], [place].   Needs CT Scan of Abdomen with contrast due to hx of Crohn's disease and extreme abdominal pain with guarding

## 2013-09-20 NOTE — Discharge Instructions (Signed)
Medication for pain and nausea. Followup your primary care Dr.     Grayland Ormond liquids for the next 24 hours

## 2013-09-21 ENCOUNTER — Other Ambulatory Visit: Payer: Self-pay | Admitting: Family Medicine

## 2013-09-21 MED ORDER — ALBUTEROL SULFATE HFA 108 (90 BASE) MCG/ACT IN AERS
2.0000 | INHALATION_SPRAY | RESPIRATORY_TRACT | Status: DC | PRN
Start: 2013-09-21 — End: 2016-03-01

## 2013-09-21 MED ORDER — MONTELUKAST SODIUM 10 MG PO TABS: 10.0000 mg | ORAL_TABLET | Freq: Every day | ORAL | Status: DC

## 2013-09-25 ENCOUNTER — Encounter (HOSPITAL_COMMUNITY): Payer: Self-pay | Admitting: Emergency Medicine

## 2013-09-25 ENCOUNTER — Emergency Department (HOSPITAL_COMMUNITY)
Admission: EM | Admit: 2013-09-25 | Discharge: 2013-09-25 | Disposition: A | Discharge status: Home / Self Care | Payer: Medicaid Other | Attending: Emergency Medicine | Admitting: Emergency Medicine

## 2013-09-25 DIAGNOSIS — IMO0002 Reserved for concepts with insufficient information to code with codable children: Secondary | ICD-10-CM | POA: Insufficient documentation

## 2013-09-25 DIAGNOSIS — R1012 Left upper quadrant pain: Secondary | ICD-10-CM | POA: Insufficient documentation

## 2013-09-25 DIAGNOSIS — F319 Bipolar disorder, unspecified: Secondary | ICD-10-CM | POA: Insufficient documentation

## 2013-09-25 DIAGNOSIS — Z87891 Personal history of nicotine dependence: Secondary | ICD-10-CM | POA: Insufficient documentation

## 2013-09-25 DIAGNOSIS — Z880 Allergy status to penicillin: Secondary | ICD-10-CM | POA: Insufficient documentation

## 2013-09-25 DIAGNOSIS — K5090 Crohn's disease, unspecified, without complications: Secondary | ICD-10-CM | POA: Insufficient documentation

## 2013-09-25 DIAGNOSIS — F411 Generalized anxiety disorder: Secondary | ICD-10-CM | POA: Insufficient documentation

## 2013-09-25 DIAGNOSIS — F909 Attention-deficit hyperactivity disorder, unspecified type: Secondary | ICD-10-CM | POA: Insufficient documentation

## 2013-09-25 DIAGNOSIS — Z9089 Acquired absence of other organs: Secondary | ICD-10-CM | POA: Insufficient documentation

## 2013-09-25 DIAGNOSIS — J45909 Unspecified asthma, uncomplicated: Secondary | ICD-10-CM | POA: Insufficient documentation

## 2013-09-25 DIAGNOSIS — Z8719 Personal history of other diseases of the digestive system: Secondary | ICD-10-CM | POA: Insufficient documentation

## 2013-09-25 DIAGNOSIS — Z79899 Other long term (current) drug therapy: Secondary | ICD-10-CM | POA: Insufficient documentation

## 2013-09-25 DIAGNOSIS — R109 Unspecified abdominal pain: Secondary | ICD-10-CM | POA: Insufficient documentation

## 2013-09-25 DIAGNOSIS — Z8669 Personal history of other diseases of the nervous system and sense organs: Secondary | ICD-10-CM | POA: Insufficient documentation

## 2013-09-25 DIAGNOSIS — R079 Chest pain, unspecified: Secondary | ICD-10-CM | POA: Insufficient documentation

## 2013-09-25 LAB — COMPREHENSIVE METABOLIC PANEL
ALT: 13 U/L (ref 0–53)
AST: 19 U/L (ref 0–37)
Albumin: 4.2 g/dL (ref 3.5–5.2)
Alkaline Phosphatase: 62 U/L (ref 39–117)
BUN: 15 mg/dL (ref 6–23)
CO2: 26 mEq/L (ref 19–32)
Calcium: 10 mg/dL (ref 8.4–10.5)
Chloride: 98 mEq/L (ref 96–112)
Creatinine, Ser: 1 mg/dL (ref 0.50–1.35)
GFR calc Af Amer: >90 mL/min (ref >90)
GFR calc non Af Amer: >90 mL/min (ref >90)
Glucose, Bld: 98 mg/dL (ref 70–99)
Potassium: 4.3 mEq/L (ref 3.7–5.3)
Sodium: 137 mEq/L (ref 137–147)
Total Bilirubin: 0.4 mg/dL (ref 0.3–1.2)
Total Protein: 7.9 g/dL (ref 6.0–8.3)

## 2013-09-25 LAB — CBC WITH DIFFERENTIAL/PLATELET
Basophils Absolute: 0 10*3/uL (ref 0.0–0.1)
Basophils Relative: 0 % (ref 0–1)
Eosinophils Absolute: 0 10*3/uL (ref 0.0–0.7)
Eosinophils Relative: 0 % (ref 0–5)
HCT: 44.6 % (ref 39.0–52.0)
Hemoglobin: 15.3 g/dL (ref 13.0–17.0)
Lymphocytes Relative: 12 % (ref 12–46)
Lymphs Abs: 1.2 10*3/uL (ref 0.7–4.0)
MCH: 28.2 pg (ref 26.0–34.0)
MCHC: 34.3 g/dL (ref 30.0–36.0)
MCV: 82.3 fL (ref 78.0–100.0)
Monocytes Absolute: 0.4 10*3/uL (ref 0.1–1.0)
Monocytes Relative: 4 % (ref 3–12)
Neutro Abs: 8.4 10*3/uL — ABNORMAL HIGH (ref 1.7–7.7)
Neutrophils Relative %: 84 % — ABNORMAL HIGH (ref 43–77)
Platelets: 246 10*3/uL (ref 150–400)
RBC: 5.42 MIL/uL (ref 4.22–5.81)
RDW: 13.1 % (ref 11.5–15.5)
WBC: 10 10*3/uL (ref 4.0–10.5)

## 2013-09-25 LAB — RAPID URINE DRUG SCREEN, HOSP PERFORMED
Amphetamines: NOT DETECTED
Barbiturates: NOT DETECTED
Benzodiazepines: NOT DETECTED
Cocaine: NOT DETECTED
Opiates: POSITIVE — AB
Tetrahydrocannabinol: NOT DETECTED

## 2013-09-25 LAB — URINALYSIS, ROUTINE W REFLEX MICROSCOPIC
Bilirubin Urine: NEGATIVE
Glucose, UA: NEGATIVE mg/dL
Hgb urine dipstick: NEGATIVE
Ketones, ur: NEGATIVE mg/dL
Leukocytes, UA: NEGATIVE
Nitrite: NEGATIVE
Protein, ur: NEGATIVE mg/dL
Specific Gravity, Urine: 1.025 (ref 1.005–1.030)
Urobilinogen, UA: 0.2 mg/dL (ref 0.0–1.0)
pH: 6 (ref 5.0–8.0)

## 2013-09-25 LAB — LIPASE, BLOOD: Lipase: 23 U/L (ref 11–59)

## 2013-09-25 MED ORDER — MORPHINE SULFATE 4 MG/ML IJ SOLN
4.0000 mg | Freq: Once | INTRAMUSCULAR | Status: AC
  Administered 2013-09-25: 4 mg via INTRAVENOUS
  Filled 2013-09-25: qty 1

## 2013-09-25 MED ORDER — METHYLPREDNISOLONE SODIUM SUCC 125 MG IJ SOLR
125.0000 mg | Freq: Once | INTRAMUSCULAR | Status: AC
  Administered 2013-09-25: 125 mg via INTRAVENOUS
  Filled 2013-09-25: qty 2

## 2013-09-25 MED ORDER — SODIUM CHLORIDE 0.9 % IV BOLUS (SEPSIS)
1000.0000 mL | Freq: Once | INTRAVENOUS | Status: AC
  Administered 2013-09-25: 1000 mL via INTRAVENOUS

## 2013-09-25 MED ORDER — PREDNISONE 20 MG PO TABS: ORAL_TABLET | ORAL | Status: DC

## 2013-09-25 MED ORDER — ONDANSETRON HCL 4 MG/2ML IJ SOLN
4.0000 mg | Freq: Once | INTRAMUSCULAR | Status: AC
  Administered 2013-09-25: 4 mg via INTRAVENOUS
  Filled 2013-09-25: qty 2

## 2013-09-25 NOTE — ED Provider Notes (Signed)
CSN: [id number]     Arrival date & time 09/25/13  1752 History  This chart was scribed for Orpah Greek, * by Rolanda Lundborg, ED Scribe. This patient was seen in room [facility] and the patient's care was started at 6:11 PM.   Chief Complaint  Patient presents with  . Abdominal Pain    LUQ pain started today denies nausea/vomiting   The history is provided by the patient. No language interpreter was used.   HPI Comments: Wayne Avila is a 23 y.o. male with a h/o Crohn's disease and colitis who presents to the Emergency Department complaining of unchanged intermittent center CP onset when he woke up this morning. He denies a connection with certain foods or after eating. He denies h/o similar pain.  He also reports intermittent right-sided abdominal pain onset weeks ago, which he states is unrelated to the CP. He states the episodes of abdominal pain last "a while" but will not give a specific duration. Mom at bedside, states pt was admitted one month ago and diagnosed with Crohn's and colitis, which are being managed by Dr [name]. He had a colonoscopy done. He denies nausea or vomiting.   Past Medical History  Diagnosis Date  . Asthma   . ADHD (attention deficit hyperactivity disorder)   . Bipolar 1 disorder   . GERD (gastroesophageal reflux disease)   . Depression   . Anxiety   . Mental developmental delay 11/15/2012  . PONV (postoperative nausea and vomiting)   . Hearing loss   . HOH (hard of hearing)   . Abdominal pain, chronic, right upper quadrant   . Colitis     possible Crohn's, seen on colonoscopy 08/30/2013   Past Surgical History  Procedure Laterality Date  . Tonsillectomy    . Adenoidectomy    . Esophagogastroduodenoscopy (egd) with propofol N/A 12/07/2012    SLF:The mucosa of the esophagus appeared normal Non-erosive gastritis (inflammation) was found in the gastric antrum; multiple biopsies The duodenal mucosa showed no abnormalities in the bulb and second  portion of the duodenum  . Esophageal biopsy N/A 12/07/2012    Procedure: GASTRIC BIOPSIES;  Surgeon: [name], MD;  Location: [facility];  Service: Endoscopy;  Laterality: N/A;  . Colonoscopy with propofol N/A 08/30/2013    Procedure: COLONOSCOPY WITH PROPOFOL;  Surgeon: [name], MD;  Location: [facility];  Service: Endoscopy;  Laterality: N/A;  entered cecum @ 0753; cecal withdrawal time- 20 minutes  . Esophageal biopsy N/A 08/30/2013    Procedure: BIOPSY;  Surgeon: [name], MD;  Location: [facility];  Service: Endoscopy;  Laterality: N/A;  right colon,transverse colon, left colon, rectal biopsies   Family History  Problem Relation Age of Onset  . Asthma Mother   . Ulcers Mother   . Bipolar disorder Mother   . Colon cancer Neg Hx   . Liver disease Neg Hx   . ADD / ADHD Father   . Diabetes Maternal Grandmother   . Diabetes Maternal Grandfather    History  Substance Use Topics  . Smoking status: Former Smoker -- 1.00 packs/day for 2 years    Types: Cigarettes    Quit date: 10/06/2012  . Smokeless tobacco: Never Used  . Alcohol Use: No    Review of Systems  Cardiovascular: Positive for chest pain.  Gastrointestinal: Positive for abdominal pain. Negative for nausea and vomiting.  All other systems reviewed and are negative.     Allergies  Amoxicillin-pot clavulanate; Omeprazole; Pineapple; Strawberry;  and Tomato  Home Medications   Prior to Admission medications   Medication Sig Start Date End Date Taking? Authorizing Provider  albuterol (PROAIR HFA) 108 (90 BASE) MCG/ACT inhaler Inhale 2 puffs into the lungs every 4 (four) hours as needed for wheezing. Persistent dry cough 09/21/13   Sandi Mealy, MD  albuterol (PROVENTIL) (2.5 MG/3ML) 0.083% nebulizer solution Take 2.5 mg by nebulization every 6 (six) hours as needed. For shortness of breath    Historical Provider, MD  atomoxetine (STRATTERA) 100 MG capsule Take 100 mg by mouth every morning.     Historical  Provider, MD  EPINEPHrine (EPIPEN) 0.3 mg/0.3 mL DEVI Inject 0.3 mLs (0.3 mg total) into the muscle once. 11/15/12   Garvin Fila, MD  loratadine (CLARITIN) 10 MG tablet Take 10 mg by mouth at bedtime.    Historical Provider, MD  Mesalamine (ASACOL) 400 MG CPDR DR capsule Take 2 capsules (800 mg total) by mouth 3 (three) times daily. FOR 6 WEEKS THEN 2 PO BID 09/16/13   [name], MD  methylphenidate (CONCERTA) 54 MG CR tablet Take 54 mg by mouth every morning.    Historical Provider, MD  montelukast (SINGULAIR) 10 MG tablet Take 1 tablet (10 mg total) by mouth at bedtime. 09/21/13   Sandi Mealy, MD  ondansetron (ZOFRAN) 4 MG tablet Take 1 tablet (4 mg total) by mouth every 8 (eight) hours as needed for nausea or vomiting. 09/13/13   Alfonzo Feller, DO  oxyCODONE-acetaminophen (PERCOCET) 5-325 MG per tablet Take 2 tablets by mouth every 4 (four) hours as needed. 09/20/13   Nat Christen, MD  predniSONE (DELTASONE) 10 MG tablet 2 PO QD FOR 14 DAYS THEN  THEN 1 PO QD FOR 14 DAYS 09/06/13   [name], MD  promethazine (PHENERGAN) 25 MG tablet Take 1 tablet (25 mg total) by mouth every 6 (six) hours as needed for nausea or vomiting. 09/20/13   Nat Christen, MD  sertraline (ZOLOFT) 50 MG tablet Take 50 mg by mouth daily.    Historical Provider, MD  ziprasidone (GEODON) 40 MG capsule Take 40 mg by mouth at bedtime.    Historical Provider, MD   BP 144/84  Pulse 77  Temp(Src) 98.4 F (36.9 C) (Oral)  Resp 18  Ht 5' 7"  (1.702 m)  Wt 180 lb (81.647 kg)  BMI 28.19 kg/m2  SpO2 100% Physical Exam  Constitutional: He is oriented to person, place, and time. He appears well-developed and well-nourished. No distress.  HENT:  Head: Normocephalic and atraumatic.  Right Ear: Hearing normal.  Left Ear: Hearing normal.  Nose: Nose normal.  Mouth/Throat: Oropharynx is clear and moist and mucous membranes are normal.  Eyes: Conjunctivae and EOM are normal. Pupils are equal, round, and reactive to  light.  Neck: Normal range of motion. Neck supple.  Cardiovascular: Regular rhythm, S1 normal and S2 normal.  Exam reveals no gallop and no friction rub.   No murmur heard. Pulmonary/Chest: Effort normal and breath sounds normal. No respiratory distress. He exhibits tenderness (acoss the sternum).  Abdominal: Soft. Normal appearance and bowel sounds are normal. There is no hepatosplenomegaly. There is tenderness (diffusely right side of abdomen). There is no rebound, no guarding, no tenderness at McBurney's point and negative Murphy's sign. No hernia.  Musculoskeletal: Normal range of motion.  Neurological: He is alert and oriented to person, place, and time. He has normal strength. No cranial nerve deficit or sensory deficit. Coordination normal. GCS eye subscore is  4. GCS verbal subscore is 5. GCS motor subscore is 6.  Skin: Skin is warm, dry and intact. No rash noted. No cyanosis.  Psychiatric: He has a normal mood and affect. His speech is normal and behavior is normal. Thought content normal.    ED Course  Procedures (including critical care time) Medications  methylPREDNISolone sodium succinate (SOLU-MEDROL) 125 mg/2 mL injection 125 mg (not administered)  morphine 4 MG/ML injection 4 mg (not administered)  sodium chloride 0.9 % bolus 1,000 mL (0 mLs Intravenous Stopped 09/25/13 2039)  morphine 4 MG/ML injection 4 mg (4 mg Intravenous Given 09/25/13 1901)  ondansetron (ZOFRAN) injection 4 mg (4 mg Intravenous Given 09/25/13 1901)    DIAGNOSTIC STUDIES: Oxygen Saturation is 100% on RA, normal by my interpretation.    COORDINATION OF CARE: 6:33 PM- Discussed treatment plan with pt which includes pain medicine, blood work, and UA. Pt agrees to plan.    Labs Review Labs Reviewed  CBC WITH DIFFERENTIAL - Abnormal; Notable for the following:    Neutrophils Relative % 84 (*)    Neutro Abs 8.4 (*)    All other components within normal limits  URINE RAPID DRUG SCREEN (HOSP PERFORMED) -  Abnormal; Notable for the following:    Opiates POSITIVE (*)    All other components within normal limits  COMPREHENSIVE METABOLIC PANEL  LIPASE, BLOOD  URINALYSIS, ROUTINE W REFLEX MICROSCOPIC    Imaging Review No results found.   EKG Interpretation None      MDM   Final diagnoses:  Abdominal pain  Crohn's disease   Patient presents to ER for evaluation of abdominal pain. This has been having intermittent pain in the right side of his abdomen. He has had intermittent upper chest discomfort as well. Patient has recently been diagnosed with Crohn's disease. He is supposed to start mesalamine, but has not gotten it because of insurance issues, is awaiting authorization. He had previously been on prednisone. 4 days ago he was dropped from 20 mg to 10 mg. This is likely causing some exacerbation. It is benign abdominal exam. He is not febrile. White count is normal. Do not suspect abscess or perforation, no concern for complication. Treat with analgesia here in the ER, increased prednisone and taper back to 10 mg.  I personally performed the services described in this documentation, which was scribed in my presence. The recorded information has been reviewed and is accurate.   Orpah Greek, MD 09/25/13 2135

## 2013-09-25 NOTE — Discharge Instructions (Signed)
Abdominal Pain, Adult °Many things can cause abdominal pain. Usually, abdominal pain is not caused by a disease and will improve without treatment. It can often be observed and treated at home. Your health care provider will do a physical exam and possibly order blood tests and X-rays to help determine the seriousness of your pain. However, in many cases, more time must pass before a clear cause of the pain can be found. Before that point, your health care provider may not know if you need more testing or further treatment. °HOME CARE INSTRUCTIONS  °Monitor your abdominal pain for any changes. The following actions may help to alleviate any discomfort you are experiencing: °· Only take over-the-counter or prescription medicines as directed by your health care provider. °· Do not take laxatives unless directed to do so by your health care provider. °· Try a clear liquid diet (broth, tea, or water) as directed by your health care provider. Slowly move to a bland diet as tolerated. °SEEK MEDICAL CARE IF: °· You have unexplained abdominal pain. °· You have abdominal pain associated with nausea or diarrhea. °· You have pain when you urinate or have a bowel movement. °· You experience abdominal pain that wakes you in the night. °· You have abdominal pain that is worsened or improved by eating food. °· You have abdominal pain that is worsened with eating fatty foods. °SEEK IMMEDIATE MEDICAL CARE IF:  °· Your pain does not go away within 2 hours. °· You have a fever. °· You keep throwing up (vomiting). °· Your pain is felt only in portions of the abdomen, such as the right side or the left lower portion of the abdomen. °· You pass bloody or black tarry stools. °MAKE SURE YOU: °· Understand these instructions.   °· Will watch your condition.   °· Will get help right away if you are not doing well or get worse.   °Document Released: 03/05/2005 Document Revised: 03/16/2013 Document Reviewed: 02/02/2013 °[employer]® Patient  Information ©2014 [employer]. ° °

## 2013-09-26 ENCOUNTER — Telehealth: Payer: Self-pay

## 2013-09-26 NOTE — Telephone Encounter (Signed)
Tried to do a PA for pts medication. They will not pay for anything until he has tried the preferred medications. Medicaid's preferred list is: Apriso, balsalazide, sulfasalazine.  Dr. [name], do you want to try the pt on any of these medications?

## 2013-09-27 NOTE — Telephone Encounter (Signed)
Called and informed pts mother. Gave delzicol samples 16 days worth to get him started until a decision is made of what to do about his meds.

## 2013-09-27 NOTE — Telephone Encounter (Signed)
Per Medicaid, delzicol has been denied.

## 2013-09-27 NOTE — Telephone Encounter (Signed)
Pt's mother called and said that the insurance would not cover the other medication we called in. Please advise

## 2013-09-27 NOTE — Telephone Encounter (Signed)
asacol 480m has been discontinued. They do not make this medications anymore. delzichol is the same medication but it is non-preferred. AS changed the rx to Delzicol. I have done a PA for this but havent heard from medicaid yet. I explained this to the pts mother yesterday and have spoken to SCableat WDriscollabout it.

## 2013-09-27 NOTE — Telephone Encounter (Signed)
Pt's mother has called back and stated that Asacol without the HD is covered by Medicaid, please advise

## 2013-09-28 ENCOUNTER — Ambulatory Visit (INDEPENDENT_AMBULATORY_CARE_PROVIDER_SITE_OTHER): Payer: Medicaid Other | Admitting: Diagnostic Neuroimaging

## 2013-09-28 ENCOUNTER — Encounter: Payer: Self-pay | Admitting: Diagnostic Neuroimaging

## 2013-09-28 VITALS — BP 147/88 | HR 106 | Ht 67.0 in | Wt 179.0 lb

## 2013-09-28 DIAGNOSIS — R55 Syncope and collapse: Secondary | ICD-10-CM

## 2013-09-28 DIAGNOSIS — R569 Unspecified convulsions: Secondary | ICD-10-CM

## 2013-09-28 NOTE — Patient Instructions (Signed)
Continue current medications. 

## 2013-09-28 NOTE — Progress Notes (Signed)
[facility]  PATIENT: Wayne Avila DOB: 27-[name]-1990  REFERRING CLINICIAN:  HISTORY FROM: patient and mother REASON FOR VISIT: follow up    HISTORICAL  CHIEF COMPLAINT:  Chief Complaint  Patient presents with  . Follow-up    seizures    HISTORY OF PRESENT ILLNESS:   UPDATE 09/28/13: Since last visit, has had 2 more events. 1 was falling out of chair, with convulsions, and rapid return to consciousness. No triggers. Another event last Sunday after going there for abdominal pain, laying on exam table, brief LOC (1 minute), without convulsions. Mom witnessed, but didn't mention to ER staff.   UPDATE 08/03/13: Since last visit, had MRI and EEG (both normal). No further witnessed convulsions or seizures. On 08/01/13, patient went to sleep in the evening. The only thing he ate that day was 2 egg/cheese biscuits. Around 2am, he work up not feeling well. He was "shaky" and confused. Family check his blood sugar at home (mother and grandmother are diabetic) and his reading was in the 31's. Patient was taken to the emergency room and evaluated. Blood sugar in the emergency room was 120. Since that time patient was continued to be fatigued and sleepy. Patient has been having increased thirst as well as weight loss over the past one year. No official diagnosis of diabetes.  PRIOR HPI (06/29/13): 23 year old male here for evaluation of seizures.  Birth/developmental history: Patient was born full-term ([redacted] weeks gestational age, 8 pounds weight, normal delivery without complication) with normal development until kindergarten. He talked with a few words starting at 5 or 6 months and started walking around 10 and half months. He had frequent ear infections with subsequent mild hearing loss. In kindergarten he had some difficulty and was held back. During his second cardiac kindergarten he was then diagnosed with hearing loss and possible ADHD. I age in her 23 years old he was diagnosed  with bipolar disorder. He had some difficulty to school and in sixth grade through 12th grade attended some special education classes as well as traditional classes. He was able to graduate high school with a diploma as well as make the on a roll several times.  HPI: February 2014 patient had first episode of possible seizure when he was at home, in the middle of an argument, dramatic event when a girl at his home claiming that she had no place to go and needed to move in. Apparently she and the patient had gone out on a date one time previously. There was a dramatic scene, and the patient's family had to call the police to have her leave. While waiting for the police, patient began to stare, then lose consciousness, have convulsions for several minutes. He was incontinent of urine. Apparently she stopped breathing. His eyes were closed and he had stiffness in his muscles. This was witnessed by patient's mother. Patient was taken by [facility] and diagnosed with an apparent stress reaction. After coming home patient was noted to be spitting up blood. He was taken to [facility] next day and diagnosed with possible gastric ulcer.  06/10/13, patient was in a car with his grandmother and girlfriend, when the grandmother noticed that his breathing changed. Apparently his eyes rolled back and he lost consciousness. Patient's mother was inside of the store at this time. Earlier in the day patient had gotten into a very heated argument and was very upset. When patient's mother came back to the car, she called 911. Patient  was taken to the hospital for evaluation. Patient was discharged with recommendation for close neurologic followup.  In the past patient has never had any other episodes of loss of consciousness. He has had multiple episodes of "staring spells" starting at age 51 years old, continuing throughout his life. Some of these episodes occur when he is upset. Some of these episodes  occur without any trigger. They have been occurring at least once per month. Some of these episodes are able to be terminated by tapping patient on the shoulder. Other episodes cannot be terminated by tapping the patient shoulder.  Patient is continuing to have psychiatry/psychology followup at [facility].  Review of prior hospital records indicate history of being bullied at school, prior suicidal thoughts and emotional trauma.   REVIEW OF SYSTEMS: Full 14 system review of systems performed and notable only for numbness seizure tremor passing out behavior problem depression anxiety allergies excessive thirst abdominal pain constipation nausea chest tightness sleep talking sleep walking hair loss excessive sweating appetite change.   ALLERGIES: Allergies  Allergen Reactions  . Amoxicillin-Pot Clavulanate Nausea And Vomiting  . Omeprazole Nausea And Vomiting  . Pineapple Swelling  . Strawberry Swelling  . Tomato Rash    HOME MEDICATIONS: Outpatient Prescriptions Prior to Visit  Medication Sig Dispense Refill  . albuterol (PROAIR HFA) 108 (90 BASE) MCG/ACT inhaler Inhale 2 puffs into the lungs every 4 (four) hours as needed for wheezing. Persistent dry cough  6.7 g  2  . albuterol (PROVENTIL) (2.5 MG/3ML) 0.083% nebulizer solution Take 2.5 mg by nebulization every 6 (six) hours as needed. For shortness of breath      . atomoxetine (STRATTERA) 100 MG capsule Take 100 mg by mouth every morning.      Marland Kitchen EPINEPHrine (EPIPEN) 0.3 mg/0.3 mL DEVI Inject 0.3 mLs (0.3 mg total) into the muscle once.  1 Device  2  . loratadine (CLARITIN) 10 MG tablet Take 10 mg by mouth at bedtime.      . methylphenidate (CONCERTA) 54 MG CR tablet Take 54 mg by mouth every morning.      . montelukast (SINGULAIR) 10 MG tablet Take 1 tablet (10 mg total) by mouth at bedtime.  30 tablet  3  . ondansetron (ZOFRAN) 4 MG tablet Take 1 tablet (4 mg total) by mouth every 8 (eight) hours as needed for nausea or  vomiting.  6 tablet  0  . oxyCODONE-acetaminophen (PERCOCET) 5-325 MG per tablet Take 2 tablets by mouth every 4 (four) hours as needed.  25 tablet  0  . predniSONE (DELTASONE) 20 MG tablet 3 tabs po daily x 3 days, then 2 tabs x 3 days, then 1.5 tabs x 3 days, then 1 tab x 3 days  23 tablet  0  . promethazine (PHENERGAN) 25 MG tablet Take 1 tablet (25 mg total) by mouth every 6 (six) hours as needed for nausea or vomiting.  20 tablet  0  . ranitidine (ZANTAC) 150 MG tablet Take 150 mg by mouth daily as needed for heartburn.      . sertraline (ZOLOFT) 50 MG tablet Take 50 mg by mouth daily.      . ziprasidone (GEODON) 80 MG capsule Take 80 mg by mouth at bedtime.      Marland Kitchen zolpidem (AMBIEN) 5 MG tablet Take 5 mg by mouth at bedtime as needed for sleep.      . Mesalamine (ASACOL) 400 MG CPDR DR capsule Take 2 capsules (800 mg total) by mouth  3 (three) times daily. FOR 6 WEEKS THEN 2 PO BID  180 capsule  11  . predniSONE (DELTASONE) 10 MG tablet Take 10 mg by mouth daily with breakfast. 14 day course with 10 days remaining in course. Originally prescribed two tablets daily for 14 days, then one tablet daily for 14 days with prescription date of 09/06/13       No facility-administered medications prior to visit.    PAST MEDICAL HISTORY: Past Medical History  Diagnosis Date  . Asthma   . ADHD (attention deficit hyperactivity disorder)   . Bipolar 1 disorder   . GERD (gastroesophageal reflux disease)   . Depression   . Anxiety   . Mental developmental delay 11/15/2012  . PONV (postoperative nausea and vomiting)   . Hearing loss   . HOH (hard of hearing)   . Abdominal pain, chronic, right upper quadrant   . Colitis     possible Crohn's, seen on colonoscopy 08/30/2013    PAST SURGICAL HISTORY: Past Surgical History  Procedure Laterality Date  . Tonsillectomy    . Adenoidectomy    . Esophagogastroduodenoscopy (egd) with propofol N/A 12/07/2012    SLF:The mucosa of the esophagus appeared normal  Non-erosive gastritis (inflammation) was found in the gastric antrum; multiple biopsies The duodenal mucosa showed no abnormalities in the bulb and second portion of the duodenum  . Esophageal biopsy N/A 12/07/2012    Procedure: GASTRIC BIOPSIES;  Surgeon: [name], MD;  Location: [facility];  Service: Endoscopy;  Laterality: N/A;  . Colonoscopy with propofol N/A 08/30/2013    Procedure: COLONOSCOPY WITH PROPOFOL;  Surgeon: [name], MD;  Location: [facility];  Service: Endoscopy;  Laterality: N/A;  entered cecum @ 0753; cecal withdrawal time- 20 minutes  . Esophageal biopsy N/A 08/30/2013    Procedure: BIOPSY;  Surgeon: [name], MD;  Location: [facility];  Service: Endoscopy;  Laterality: N/A;  right colon,transverse colon, left colon, rectal biopsies    FAMILY HISTORY: Family History  Problem Relation Age of Onset  . Asthma Mother   . Ulcers Mother   . Bipolar disorder Mother   . Colon cancer Neg Hx   . Liver disease Neg Hx   . ADD / ADHD Father   . Diabetes Maternal Grandmother   . Diabetes Maternal Grandfather     SOCIAL HISTORY:  History   Social History  . Marital Status: Single    Spouse Name: N/A    Number of Children: 0  . Years of Education: 12th   Occupational History  .      not working   Social History Main Topics  . Smoking status: Former Smoker -- 1.00 packs/day for 2 years    Types: Cigarettes    Quit date: 10/06/2012  . Smokeless tobacco: Never Used  . Alcohol Use: No  . Drug Use: No  . Sexual Activity: Yes   Other Topics Concern  . Not on file   Social History Narrative   Patient lives at home with girlfriend and family.   Caffeine Use: occasionally     PHYSICAL EXAM  Filed Vitals:   09/28/13 1436  BP: 147/88  Pulse: 106  Height: 5' 7"  (1.702 m)  Weight: 179 lb (81.194 kg)    Not recorded    Body mass index is 28.03 kg/(m^2).   GENERAL EXAM: Patient is in no distress; SLEEPY, PSYCHOMOTOR SLOWING, SOFT VOICE, QUIET RESPONSES,  POOR EYE CONTACT.   CARDIOVASCULAR: Regular rate and rhythm, no murmurs,  no carotid bruits  NEUROLOGIC: MENTAL STATUS: awake, alert, oriented to person, place and time, recent and remote memory intact, normal attention and concentration, language fluent, comprehension intact, naming intact, fund of knowledge appropriate CRANIAL NERVE: no papilledema on fundoscopic exam, pupils equal and reactive to light, visual fields full to confrontation, extraocular muscles intact, no nystagmus, facial sensation and strength symmetric, hearing intact, palate elevates symmetrically, uvula midline, shoulder shrug symmetric, tongue midline. MOTOR: normal bulk and tone, full strength in the BUE, BLE SENSORY: normal and symmetric to light touch, temperature, vibration COORDINATION: finger-nose-finger, fine finger movements normal REFLEXES: deep tendon reflexes present and symmetric GAIT/STATION: narrow based gait; able to walk on toes, heels and tandem; romberg is negative   DIAGNOSTIC DATA (LABS, IMAGING, TESTING) - I reviewed patient records, labs, notes, testing and imaging myself where available.  Lab Results  Component Value Date   WBC 10.0 09/25/2013   HGB 15.3 09/25/2013   HCT 44.6 09/25/2013   MCV 82.3 09/25/2013   PLT 246 09/25/2013      Component Value Date/Time   NA 137 09/25/2013 1852   K 4.3 09/25/2013 1852   CL 98 09/25/2013 1852   CO2 26 09/25/2013 1852   GLUCOSE 98 09/25/2013 1852   BUN 15 09/25/2013 1852   CREATININE 1.00 09/25/2013 1852   CALCIUM 10.0 09/25/2013 1852   PROT 7.9 09/25/2013 1852   ALBUMIN 4.2 09/25/2013 1852   AST 19 09/25/2013 1852   ALT 13 09/25/2013 1852   ALKPHOS 62 09/25/2013 1852   BILITOT 0.4 09/25/2013 1852   GFRNONAA >90 09/25/2013 1852   GFRAA >90 09/25/2013 1852   No results found for this basename: CHOL,  HDL,  LDLCALC,  LDLDIRECT,  TRIG,  CHOLHDL   Lab Results  Component Value Date   HGBA1C 5.7* 08/03/2013   No results found for this basename: VITAMINB12    Lab Results  Component Value Date   TSH 1.996 08/09/2013    I reviewed images myself and agree with interpretation.   10/20/10 CT head - normal   07/14/13 MRI brain - normal  07/21/13 EEG - normal   ASSESSMENT AND PLAN  23 y.o. year old male here with possible seizure vs syncope vs pseudo-seizure. MRI and EEG unremarkable.    PLAN: - refer for VEEG for definitive diagnosis  - no antiseizure meds for now - no driving or operating heavy machinery (pt does not have driver's license); caution with heights, swimming and other seizure precautions  Return in about 4 months (around 01/28/2014).   Penni Bombard, MD 9/84/2103, 1:28 PM Certified in Neurology, Neurophysiology and Neuroimaging  [facility] [address], [place], [place] [address] [phone]

## 2013-09-29 MED ORDER — MESALAMINE ER 0.375 G PO CP24: 1.5000 g | ORAL_CAPSULE | Freq: Every day | ORAL | Status: DC

## 2013-09-29 NOTE — Telephone Encounter (Signed)
PLEASE CALL PT. HE MAY START WITH APRISO 4 PILLS DAILY. RX SENT.

## 2013-09-29 NOTE — Telephone Encounter (Signed)
Pt's mother is aware of new Rx at drug store

## 2013-10-03 ENCOUNTER — Other Ambulatory Visit: Payer: Self-pay | Admitting: Family Medicine

## 2013-10-25 ENCOUNTER — Ambulatory Visit (INDEPENDENT_AMBULATORY_CARE_PROVIDER_SITE_OTHER): Payer: Medicaid Other | Admitting: Gastroenterology

## 2013-10-25 ENCOUNTER — Encounter: Payer: Self-pay | Admitting: Gastroenterology

## 2013-10-25 VITALS — BP 139/93 | HR 99 | Temp 98.7°F | Ht 67.5 in | Wt 174.0 lb

## 2013-10-25 DIAGNOSIS — K5190 Ulcerative colitis, unspecified, without complications: Secondary | ICD-10-CM | POA: Insufficient documentation

## 2013-10-25 MED ORDER — BUDESONIDE 9 MG PO TB24: 9.0000 mg | ORAL_TABLET | Freq: Every day | ORAL | Status: DC

## 2013-10-25 NOTE — Patient Instructions (Signed)
1. Continue Apriso four daily. 2. Start Uceris one tablet daily for 8 weeks, then stop. Samples provided. RX sent to [facility]. 3. Office visit in 8 weeks with Dr. [name]. Call sooner if needed.

## 2013-10-25 NOTE — Progress Notes (Signed)
Primary Care Physician: Graciella Freer, MD  Primary Gastroenterologist:  Barney Drain, MD   Chief Complaint  Patient presents with  . Follow-up    HPI: Wayne Avila is a 23 y.o. male here for followup. 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 Biopsies could not delineate between Crohn's and ulcerative colitis. [facility] lab test ordered and c/w UC per Dr. Nona Dell note. Recently started on Apriso daily, about 3 weeks ago. Finished steroid taper. Delzicol not covered by Medicaid. Was seen in ER twice last month for abdominal pain. Patient is a difficult historian. Answers some questions, but mother provides a lot of answers.   Right sided abdominal pain, not every day. Wakes up with it. Eating makes the pain worse. BM every AM, 2-3 times per day, no solid stool. No further bleeding. Appetite ok. Eats more at night. No heartburn, vomiting. Weight stable since 07/2013 although he did get up to 180 a few weeks ago and subsequently drop 5 pounds. Mother states he has had some improvement but still frequently complains of abdominal pain on the right side.  Current Outpatient Prescriptions  Medication Sig Dispense Refill  . albuterol (PROAIR HFA) 108 (90 BASE) MCG/ACT inhaler Inhale 2 puffs into the lungs every 4 (four) hours as needed for wheezing. Persistent dry cough  6.7 g  2  . albuterol (PROVENTIL) (2.5 MG/3ML) 0.083% nebulizer solution Take 2.5 mg by nebulization every 6 (six) hours as needed. For shortness of breath      . atomoxetine (STRATTERA) 100 MG capsule Take 100 mg by mouth every morning.      Marland Kitchen EPINEPHrine (EPIPEN) 0.3 mg/0.3 mL DEVI Inject 0.3 mLs (0.3 mg total) into the muscle once.  1 Device  2  . loratadine (CLARITIN) 10 MG tablet Take 10 mg by mouth at bedtime.      Marland Kitchen loratadine (CLARITIN) 10 MG tablet TAKE 1 TABLET BY MOUTH EVERY NIGHT AT BEDTIME  30 tablet  0  . mesalamine (APRISO) 0.375 G 24 hr capsule Take 4  capsules (1.5 g total) by mouth daily.  124 capsule  11  . methylphenidate (CONCERTA) 54 MG CR tablet Take 54 mg by mouth every morning.      . montelukast (SINGULAIR) 10 MG tablet Take 1 tablet (10 mg total) by mouth at bedtime.  30 tablet  3  . ranitidine (ZANTAC) 150 MG tablet Take 150 mg by mouth daily as needed for heartburn.      . sertraline (ZOLOFT) 50 MG tablet Take 50 mg by mouth daily.      . ziprasidone (GEODON) 80 MG capsule Take 80 mg by mouth at bedtime.      Marland Kitchen zolpidem (AMBIEN) 5 MG tablet Take 5 mg by mouth at bedtime as needed for sleep.       No current facility-administered medications for this visit.    Allergies as of 10/25/2013 - Review Complete 10/25/2013  Allergen Reaction Noted  . Amoxicillin-pot clavulanate Nausea And Vomiting 02/28/2011  . Omeprazole Nausea And Vomiting 04/20/2013  . Pineapple Swelling 04/14/2011  . Strawberry Swelling 05/08/2012  . Tomato Rash 04/14/2011    ROS:  General: Negative for anorexia, weight loss, fever, chills, fatigue, weakness. ENT: Negative for hoarseness, difficulty swallowing , nasal congestion. CV: Negative for chest pain, angina, palpitations, dyspnea on exertion, peripheral edema.  Respiratory: Negative for dyspnea at rest, dyspnea on exertion, cough, sputum, wheezing.  GI: See history of present illness. GU:  Negative for  dysuria, hematuria, urinary incontinence, urinary frequency, nocturnal urination.  Endo: Negative for unusual weight change.    Physical Examination:   BP 139/93  Pulse 99  Temp(Src) 98.7 F (37.1 C) (Oral)  Ht 5' 7.5" (1.715 m)  Wt 174 lb (78.926 kg)  BMI 26.83 kg/m2  General: Well-nourished, well-developed in no acute distress.  Eyes: No icterus. Mouth: Oropharyngeal mucosa moist and pink , no lesions erythema or exudate. Lungs: Clear to auscultation bilaterally.  Heart: Regular rate and rhythm, no murmurs rubs or gallops.  Abdomen: Bowel sounds are normal, nondistended, no  hepatosplenomegaly or masses, no abdominal bruits or hernia , no rebound or guarding.  Right mid abd tenderness. Extremities: No lower extremity edema. No clubbing or deformities. Neuro: Alert and oriented x 4   Skin: Warm and dry, no jaundice.   Psych: Alert and cooperative, normal mood and affect.  Labs:  Lab Results  Component Value Date   WBC 10.0 09/25/2013   HGB 15.3 09/25/2013   HCT 44.6 09/25/2013   MCV 82.3 09/25/2013   PLT 246 09/25/2013   Lab Results  Component Value Date   CREATININE 1.00 09/25/2013   BUN 15 09/25/2013   NA 137 09/25/2013   K 4.3 09/25/2013   CL 98 09/25/2013   CO2 26 09/25/2013   Lab Results  Component Value Date   ALT 13 09/25/2013   AST 19 09/25/2013   ALKPHOS 62 09/25/2013   BILITOT 0.4 09/25/2013   Lab Results  Component Value Date   LIPASE 23 09/25/2013    Imaging Studies: No results found.

## 2013-10-25 NOTE — Assessment & Plan Note (Signed)
23 y/o male with recent diagnosis of IBD with active disease noted in the rectum and ascending colon. Bx were inconclusive. [facility] test c/w UC. Started on Apriso three weeks ago. Bleeding resolved but still with abdominal pain, loose stool. Not yet in remission. Start Uceris 67m daily for 8 weeks. Continue Apriso. OV in 8 weeks. Call sooner if no continued improvement.

## 2013-11-28 ENCOUNTER — Encounter: Payer: Self-pay | Admitting: Gastroenterology

## 2013-11-28 ENCOUNTER — Ambulatory Visit (INDEPENDENT_AMBULATORY_CARE_PROVIDER_SITE_OTHER): Payer: Medicaid Other | Admitting: Gastroenterology

## 2013-11-28 VITALS — BP 123/80 | HR 82 | Temp 97.5°F | Resp 20 | Ht 67.5 in | Wt 171.0 lb

## 2013-11-28 DIAGNOSIS — R634 Abnormal weight loss: Secondary | ICD-10-CM

## 2013-11-28 DIAGNOSIS — K51911 Ulcerative colitis, unspecified with rectal bleeding: Secondary | ICD-10-CM

## 2013-11-28 DIAGNOSIS — K5190 Ulcerative colitis, unspecified, without complications: Secondary | ICD-10-CM

## 2013-11-28 MED ORDER — PREDNISONE 10 MG PO TABS: ORAL_TABLET | ORAL | Status: DC

## 2013-11-28 NOTE — Progress Notes (Signed)
cc'd to pcp 

## 2013-11-28 NOTE — Patient Instructions (Signed)
1. Prednisone taper. RX sent to [facility]. 2. Continue Apriso. 3. Office visit with Dr. [name] in 3 months. Call sooner if needed.

## 2013-11-28 NOTE — Progress Notes (Signed)
Primary Care Physician: Yevette Edwards, NP  Primary Gastroenterologist:  Barney Drain, MD   Chief Complaint  Patient presents with  . Rectal Bleeding  . Ulcerative Colitis    HPI: Wayne Avila is a 23 y.o. male here for f/u. Last seen 10/25/13.   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 Biopsies could not delineate between Crohn's and ulcerative colitis. [facility] lab test ordered and c/w UC per Dr. Nona Dell note. Recently started on Apriso daily, about 3 weeks ago. Finished steroid taper. Delzicol not covered by Medicaid. Was seen in ER twice last month for abdominal pain. Patient is a difficult historian. Answers some questions, but mother provides a lot of answers.   Patient was never able to get the [employer] but mother never let us know. It was not covered by insurance. She continues to have rectal bleeding. Abdominal pain off an on, last time over the weekend. Lot of diarrhea. She reports that he is eating all the time but continues to lose weight. Maximum weight of 200 pounds but looking back over the past 12 months typically in the mid 180s.  Wt Readings from Last 3 Encounters:  11/28/13 171 lb (77.565 kg)  10/25/13 174 lb (78.926 kg)  09/28/13 179 lb (81.194 kg)      Current Outpatient Prescriptions  Medication Sig Dispense Refill  . albuterol (PROAIR HFA) 108 (90 BASE) MCG/ACT inhaler Inhale 2 puffs into the lungs every 4 (four) hours as needed for wheezing. Persistent dry cough  6.7 g  2  . albuterol (PROVENTIL) (2.5 MG/3ML) 0.083% nebulizer solution Take 2.5 mg by nebulization every 6 (six) hours as needed. For shortness of breath      . atomoxetine (STRATTERA) 100 MG capsule Take 100 mg by mouth every morning.      . cloNIDine (CATAPRES) 0.3 MG tablet Take 0.3 mg by mouth daily.      Marland Kitchen EPINEPHrine (EPIPEN) 0.3 mg/0.3 mL DEVI Inject 0.3 mLs (0.3 mg total) into the muscle once.  1 Device  2  . loratadine  (CLARITIN) 10 MG tablet Take 10 mg by mouth at bedtime.      Marland Kitchen loratadine (CLARITIN) 10 MG tablet TAKE 1 TABLET BY MOUTH EVERY NIGHT AT BEDTIME  30 tablet  0  . mesalamine (APRISO) 0.375 G 24 hr capsule Take 4 capsules (1.5 g total) by mouth daily.  124 capsule  11  . methylphenidate (CONCERTA) 54 MG CR tablet Take 54 mg by mouth every morning.      . montelukast (SINGULAIR) 10 MG tablet Take 1 tablet (10 mg total) by mouth at bedtime.  30 tablet  3  . ranitidine (ZANTAC) 150 MG tablet Take 150 mg by mouth daily as needed for heartburn.      . sertraline (ZOLOFT) 50 MG tablet Take 50 mg by mouth daily.      . ziprasidone (GEODON) 80 MG capsule Take 80 mg by mouth at bedtime.      . Budesonide (UCERIS) 9 MG TB24 Take 9 mg by mouth daily.  30 tablet  1  . zolpidem (AMBIEN) 5 MG tablet Take 5 mg by mouth at bedtime as needed for sleep.       No current facility-administered medications for this visit.    Allergies as of 11/28/2013 - Review Complete 11/28/2013  Allergen Reaction Noted  . Amoxicillin-pot clavulanate Nausea And Vomiting 02/28/2011  . Omeprazole Nausea And Vomiting 04/20/2013  . Pineapple Swelling  04/14/2011  . Strawberry Swelling 05/08/2012  . Tomato Rash 04/14/2011    ROS:  General: Negative for anorexia, fever, chills, fatigue, weakness. Positive weight loss, 3 additional pounds. ENT: Negative for hoarseness, difficulty swallowing , nasal congestion. CV: Negative for chest pain, angina, palpitations, dyspnea on exertion, peripheral edema.  Respiratory: Negative for dyspnea at rest, dyspnea on exertion, cough, sputum, wheezing.  GI: See history of present illness. GU:  Negative for dysuria, hematuria, urinary incontinence, urinary frequency, nocturnal urination.  Endo: See history of present illness  Physical Examination:   BP 123/80  Pulse 82  Temp(Src) 97.5 F (36.4 C) (Oral)  Resp 20  Ht 5' 7.5" (1.715 m)  Wt 171 lb (77.565 kg)  BMI 26.37 kg/m2  General:  Well-nourished, well-developed in no acute distress. Accompanied by mother. Eyes: No icterus. Mouth: Oropharyngeal mucosa moist and pink , no lesions erythema or exudate. Abdomen: Bowel sounds are normal, nontender, nondistended, no hepatosplenomegaly or masses, no abdominal bruits or hernia , no rebound or guarding.   Extremities: No lower extremity edema. No clubbing or deformities. Neuro: Alert and oriented x 4   Skin: Warm and dry, no jaundice.   Psych: Alert and cooperative, normal mood and affect.

## 2013-11-28 NOTE — Assessment & Plan Note (Signed)
Never started Uceris. Not covered by insurance but she did not let us know. Continues to have flare. Short course of prednisone with taper. OV in 12 weeks with Dr. [name]. She states patient has been compliant with Apriso which was started back in 09/2013.

## 2013-11-28 NOTE — Assessment & Plan Note (Signed)
Ongoing concern. Continue to monitor. Hopefully improves with better management of UC. Extensive evaluation as previously outlined.

## 2013-12-16 LAB — PROMETHEUS-MAIL

## 2013-12-22 NOTE — Progress Notes (Signed)
REVIEWED.  

## 2014-01-03 ENCOUNTER — Emergency Department (HOSPITAL_COMMUNITY)
Admission: EM | Admit: 2014-01-03 | Discharge: 2014-01-03 | Disposition: A | Discharge status: Home / Self Care | Payer: Medicaid Other | Source: Home / Self Care | Attending: Emergency Medicine | Admitting: Emergency Medicine

## 2014-01-03 ENCOUNTER — Encounter (HOSPITAL_COMMUNITY): Payer: Self-pay | Admitting: Emergency Medicine

## 2014-01-03 ENCOUNTER — Emergency Department (HOSPITAL_COMMUNITY): Payer: Medicaid Other

## 2014-01-03 ENCOUNTER — Emergency Department (HOSPITAL_COMMUNITY)
Admission: EM | Admit: 2014-01-03 | Discharge: 2014-01-03 | Disposition: A | Discharge status: Home / Self Care | Payer: Medicaid Other | Attending: Emergency Medicine | Admitting: Emergency Medicine

## 2014-01-03 DIAGNOSIS — G8929 Other chronic pain: Secondary | ICD-10-CM | POA: Diagnosis not present

## 2014-01-03 DIAGNOSIS — S93601A Unspecified sprain of right foot, initial encounter: Secondary | ICD-10-CM

## 2014-01-03 DIAGNOSIS — J4520 Mild intermittent asthma, uncomplicated: Secondary | ICD-10-CM

## 2014-01-03 DIAGNOSIS — Z880 Allergy status to penicillin: Secondary | ICD-10-CM | POA: Insufficient documentation

## 2014-01-03 DIAGNOSIS — H9190 Unspecified hearing loss, unspecified ear: Secondary | ICD-10-CM | POA: Insufficient documentation

## 2014-01-03 DIAGNOSIS — R002 Palpitations: Secondary | ICD-10-CM | POA: Diagnosis not present

## 2014-01-03 DIAGNOSIS — F319 Bipolar disorder, unspecified: Secondary | ICD-10-CM | POA: Diagnosis not present

## 2014-01-03 DIAGNOSIS — Z87891 Personal history of nicotine dependence: Secondary | ICD-10-CM | POA: Diagnosis not present

## 2014-01-03 DIAGNOSIS — J45909 Unspecified asthma, uncomplicated: Secondary | ICD-10-CM | POA: Diagnosis not present

## 2014-01-03 DIAGNOSIS — IMO0002 Reserved for concepts with insufficient information to code with codable children: Secondary | ICD-10-CM | POA: Diagnosis not present

## 2014-01-03 DIAGNOSIS — K219 Gastro-esophageal reflux disease without esophagitis: Secondary | ICD-10-CM | POA: Diagnosis not present

## 2014-01-03 DIAGNOSIS — F411 Generalized anxiety disorder: Secondary | ICD-10-CM | POA: Diagnosis not present

## 2014-01-03 DIAGNOSIS — F909 Attention-deficit hyperactivity disorder, unspecified type: Secondary | ICD-10-CM | POA: Diagnosis not present

## 2014-01-03 DIAGNOSIS — R110 Nausea: Secondary | ICD-10-CM | POA: Insufficient documentation

## 2014-01-03 DIAGNOSIS — Z79899 Other long term (current) drug therapy: Secondary | ICD-10-CM | POA: Insufficient documentation

## 2014-01-03 LAB — CBC WITH DIFFERENTIAL/PLATELET
Basophils Absolute: 0 10*3/uL (ref 0.0–0.1)
Basophils Relative: 0 % (ref 0–1)
Eosinophils Absolute: 0.1 10*3/uL (ref 0.0–0.7)
Eosinophils Relative: 1 % (ref 0–5)
HCT: 42 % (ref 39.0–52.0)
Hemoglobin: 14.7 g/dL (ref 13.0–17.0)
Lymphocytes Relative: 23 % (ref 12–46)
Lymphs Abs: 2.9 10*3/uL (ref 0.7–4.0)
MCH: 28.6 pg (ref 26.0–34.0)
MCHC: 35 g/dL (ref 30.0–36.0)
MCV: 81.7 fL (ref 78.0–100.0)
Monocytes Absolute: 0.6 10*3/uL (ref 0.1–1.0)
Monocytes Relative: 5 % (ref 3–12)
Neutro Abs: 8.9 10*3/uL — ABNORMAL HIGH (ref 1.7–7.7)
Neutrophils Relative %: 71 % (ref 43–77)
Platelets: 260 10*3/uL (ref 150–400)
RBC: 5.14 MIL/uL (ref 4.22–5.81)
RDW: 12.6 % (ref 11.5–15.5)
WBC: 12.5 10*3/uL — ABNORMAL HIGH (ref 4.0–10.5)

## 2014-01-03 LAB — COMPREHENSIVE METABOLIC PANEL
ALT: 11 U/L (ref 0–53)
AST: 18 U/L (ref 0–37)
Albumin: 4.4 g/dL (ref 3.5–5.2)
Alkaline Phosphatase: 57 U/L (ref 39–117)
Anion gap: 19 — ABNORMAL HIGH (ref 5–15)
BUN: 8 mg/dL (ref 6–23)
CO2: 19 mEq/L (ref 19–32)
Calcium: 9.9 mg/dL (ref 8.4–10.5)
Chloride: 106 mEq/L (ref 96–112)
Creatinine, Ser: 1.23 mg/dL (ref 0.50–1.35)
GFR calc Af Amer: >90 mL/min (ref >90)
GFR calc non Af Amer: 83 mL/min — ABNORMAL LOW (ref >90)
Glucose, Bld: 108 mg/dL — ABNORMAL HIGH (ref 70–99)
Potassium: 3.9 mEq/L (ref 3.7–5.3)
Sodium: 144 mEq/L (ref 137–147)
Total Bilirubin: 0.4 mg/dL (ref 0.3–1.2)
Total Protein: 7.5 g/dL (ref 6.0–8.3)

## 2014-01-03 LAB — TROPONIN I: Troponin I: <0.3 ng/mL (ref <0.30)

## 2014-01-03 MED ORDER — ALBUTEROL SULFATE HFA 108 (90 BASE) MCG/ACT IN AERS
2.0000 | INHALATION_SPRAY | RESPIRATORY_TRACT | Status: DC | PRN
  Administered 2014-01-03: 2 via RESPIRATORY_TRACT
  Filled 2014-01-03: qty 6.7

## 2014-01-03 NOTE — ED Provider Notes (Addendum)
CSN: [id number]     Arrival date & time 01/03/14  1833 History   First MD Initiated Contact with Patient 01/03/14 2040     Chief Complaint  Patient presents with  . Foot Pain    rt     (Consider location/radiation/quality/duration/timing/severity/associated sxs/prior Treatment) Patient is a 23 y.o. male presenting with lower extremity pain. The history is provided by the patient and a parent.  Foot Pain This is a new problem. The current episode started in the past 7 days. The problem occurs constantly. The problem has been unchanged.   Wayne Avila is a 23 y.o. male who presents to the ED with right foot pain. His mother states he has been doing a lot of walking recently and after that he started complaining of the foot pain. He has no other complaints at this time. He was evaluated at [facility] earlier today with asthma and had a chest x-ray.  He has a history of asthma and chronic abdominal pain and mental development delay.  Past Medical History  Diagnosis Date  . Asthma   . ADHD (attention deficit hyperactivity disorder)   . Bipolar 1 disorder   . GERD (gastroesophageal reflux disease)   . Depression   . Anxiety   . Mental developmental delay 11/15/2012  . PONV (postoperative nausea and vomiting)   . Hearing loss   . HOH (hard of hearing)   . Abdominal pain, chronic, right upper quadrant   . Colitis     possible Crohn's, seen on colonoscopy 08/30/2013   Past Surgical History  Procedure Laterality Date  . Tonsillectomy    . Adenoidectomy    . Esophagogastroduodenoscopy (egd) with propofol N/A 12/07/2012    SLF:The mucosa of the esophagus appeared normal Non-erosive gastritis (inflammation) was found in the gastric antrum; multiple biopsies The duodenal mucosa showed no abnormalities in the bulb and second portion of the duodenum  . Esophageal biopsy N/A 12/07/2012    Procedure: GASTRIC BIOPSIES;  Surgeon: [name], MD;  Location: [facility];  Service: Endoscopy;  Laterality: N/A;   . Colonoscopy with propofol N/A 08/30/2013    GEX:BMWUXL mucosa in the terminal ileum/COLITIS/ MILD PROCTITIS. 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  . Esophageal biopsy N/A 08/30/2013    Procedure: BIOPSY;  Surgeon: [name], MD;  Location: [facility];  Service: Endoscopy;  Laterality: N/A;  right colon,transverse colon, left colon, rectal biopsies   Family History  Problem Relation Age of Onset  . Asthma Mother   . Ulcers Mother   . Bipolar disorder Mother   . Colon cancer Neg Hx   . Liver disease Neg Hx   . ADD / ADHD Father   . Diabetes Maternal Grandmother   . Diabetes Maternal Grandfather    History  Substance Use Topics  . Smoking status: Former Smoker -- 1.00 packs/day for 2 years    Types: Cigarettes    Quit date: 10/06/2012  . Smokeless tobacco: Never Used  . Alcohol Use: No    Review of Systems Negative except as stated in HPI   Allergies  Amoxicillin-pot clavulanate; Omeprazole; Pineapple; Strawberry; and Tomato  Home Medications   Prior to Admission medications   Medication Sig Start Date End Date Taking? Authorizing Provider  albuterol (PROAIR HFA) 108 (90 BASE) MCG/ACT inhaler Inhale 2 puffs into the lungs every 4 (four) hours as needed for wheezing. Persistent dry cough 09/21/13  Sandi Mealy, MD  albuterol (PROVENTIL) (2.5 MG/3ML) 0.083% nebulizer solution Take 2.5 mg by nebulization every 6 (six) hours as needed. For shortness of breath    Historical Provider, MD  atomoxetine (STRATTERA) 100 MG capsule Take 100 mg by mouth every morning.    Historical Provider, MD  cloNIDine (CATAPRES) 0.3 MG tablet Take 0.3 mg by mouth daily.    Historical Provider, MD  EPINEPHrine (EPIPEN) 0.3 mg/0.3 mL DEVI Inject 0.3 mLs (0.3 mg total) into the muscle once. 11/15/12   Garvin Fila, MD  loratadine (CLARITIN) 10 MG tablet Take 10 mg by mouth at  bedtime.    Historical Provider, MD  loratadine (CLARITIN) 10 MG tablet TAKE 1 TABLET BY MOUTH EVERY NIGHT AT BEDTIME 10/03/13   Sandi Mealy, MD  mesalamine (APRISO) 0.375 G 24 hr capsule Take 4 capsules (1.5 g total) by mouth daily. 09/29/13   [name], MD  methylphenidate (CONCERTA) 54 MG CR tablet Take 54 mg by mouth every morning.    Historical Provider, MD  montelukast (SINGULAIR) 10 MG tablet Take 1 tablet (10 mg total) by mouth at bedtime. 09/21/13   Sandi Mealy, MD  OLANZapine (ZYPREXA PO) Take 1 tablet by mouth daily.    Historical Provider, MD  predniSONE (DELTASONE) 10 MG tablet Take 3 daily for 7 days, then 2 daily for 7 days, then 1 daily for 7 days. 11/28/13   Mahala Menghini, PA-C  ranitidine (ZANTAC) 150 MG tablet Take 150 mg by mouth daily as needed for heartburn.    Historical Provider, MD  sertraline (ZOLOFT) 50 MG tablet Take 50 mg by mouth daily.    Historical Provider, MD  ziprasidone (GEODON) 80 MG capsule Take 80 mg by mouth at bedtime.    Historical Provider, MD  zolpidem (AMBIEN) 5 MG tablet Take 5 mg by mouth at bedtime as needed for sleep.    Historical Provider, MD   BP 136/90  Pulse 96  Temp(Src) 98.8 F (37.1 C) (Oral)  Resp 18  Ht 5' 8"  (1.727 m)  Wt 173 lb (78.472 kg)  BMI 26.31 kg/m2  SpO2 100% Physical Exam  Nursing note and vitals reviewed. Constitutional: He is oriented to person, place, and time. He appears well-developed and well-nourished.  Eyes: EOM are normal.  Neck: Neck supple.  Pulmonary/Chest: Effort normal.  Abdominal: Soft. There is no tenderness.  Musculoskeletal: Normal range of motion.       Right foot: He exhibits tenderness. He exhibits normal range of motion, no swelling, normal capillary refill, no deformity and no laceration.  There is tender with palpation of the plantar aspect of the right foot. Pedal pulse strong, adequate circulation.   Neurological: He is alert and oriented to person, place, and time. No cranial  nerve deficit.  Skin: Skin is warm and dry.    ED Course  Procedures (including critical care time) Labs Review X-ray, ace wrap, ice and elevation Labs Reviewed - No data to display  Imaging Review Dg Chest [facility] 1 View  01/03/2014   CLINICAL DATA:  Palpitations, tachycardia.  EXAM: PORTABLE CHEST - 1 VIEW  COMPARISON:  Acute abdominal series September 20, 2013.  FINDINGS: Cardiomediastinal silhouette is unremarkable. The lungs are clear without pleural effusions or focal consolidations. Trachea projects midline and there is no pneumothorax. Soft tissue planes and included osseous structures are non-suspicious.  IMPRESSION: Normal chest.   Electronically Signed   By: Elon Alas   On: 01/03/2014 05:31  Dg Foot Complete Right  01/03/2014   CLINICAL DATA:  FOOT PAIN  EXAM: RIGHT FOOT COMPLETE - 3+ VIEW  COMPARISON:  None.  FINDINGS: There is no evidence of fracture or dislocation. There is no evidence of arthropathy or other focal bone abnormality. Soft tissues are unremarkable.  IMPRESSION: Negative.   Electronically Signed   By: Jeannine Boga M.D.   On: 01/03/2014 19:13    MDM  23 y.o. male with right foot pain after extensive walking the past few days. Ace wrap applied, ice, elevation and follow up with ortho if symptoms persist. Stable for discharge without neurovascular deficits. Discussed with the patient's mother clinical and x-ray findings and plan of care. All questioned fully answered.   [place], NP 01/04/14 [place], [place] 01/16/14 1536

## 2014-01-03 NOTE — Discharge Instructions (Signed)
Asthma Asthma is a condition of the lungs in which the airways tighten and narrow. Asthma can make it hard to breathe. Asthma cannot be cured, but medicine and lifestyle changes can help control it. Asthma may be started (triggered) by:  Animal skin flakes (dander).  Dust.  Cockroaches.  Pollen.  Mold.  Smoke.  Cleaning products.  Hair sprays or aerosol sprays.  Paint fumes or strong smells.  Cold air, weather changes, and winds.  Crying or laughing hard.  Stress.  Certain medicines or drugs.  Foods, such as dried fruit, potato chips, and sparkling grape juice.  Infections or conditions (colds, flu).  Exercise.  Certain medical conditions or diseases.  Exercise or tiring activities. HOME CARE   Take medicine as told by your doctor.  Use a peak flow meter as told by your doctor. A peak flow meter is a tool that measures how well the lungs are working.  Record and keep track of the peak flow meter's readings.  Understand and use the asthma action plan. An asthma action plan is a written plan for taking care of your asthma and treating your attacks.  To help prevent asthma attacks:  Do not smoke. Stay away from secondhand smoke.  Change your heating and air conditioning filter often.  Limit your use of fireplaces and wood stoves.  Get rid of pests (such as roaches and mice) and their droppings.  Throw away plants if you see mold on them.  Clean your floors. Dust regularly. Use cleaning products that do not smell.  Have someone vacuum when you are not home. Use a vacuum cleaner with a HEPA filter if possible.  Replace carpet with wood, tile, or vinyl flooring. Carpet can trap animal skin flakes and dust.  Use allergy-proof pillows, mattress covers, and box spring covers.  Wash bed sheets and blankets every week in hot water and dry them in a dryer.  Use blankets that are made of polyester or cotton.  Clean bathrooms and kitchens with bleach. If  possible, have someone repaint the walls in these rooms with mold-resistant paint. Keep out of the rooms that are being cleaned and painted.  Wash hands often. GET HELP IF:  You have make a whistling sound when breaking (wheeze), have shortness of breath, or have a cough even if taking medicine to prevent attacks.  The colored mucus you cough up (sputum) is thicker than usual.  The colored mucus you cough up changes from clear or white to yellow, green, gray, or bloody.  You have problems from the medicine you are taking such as:  A rash.  Itching.  Swelling.  Trouble breathing.  You need reliever medicines more than 2-3 times a week.  Your peak flow measurement is still at 50-79% of your personal best after following the action plan for 1 hour.  You have a fever. GET HELP RIGHT AWAY IF:   You seem to be worse and are not responding to medicine during an asthma attack.  You are short of breath even at rest.  You get short of breath when doing very little activity.  You have trouble eating, drinking, or talking.  You have chest pain.  You have a fast heartbeat.  Your lips or fingernails start to turn blue.  You are light-headed, dizzy, or faint.  Your peak flow is less than 50% of your personal best. MAKE SURE YOU:   Understand these instructions.  Will watch your condition.  Will get help right away if you  are not doing well or get worse. Document Released: 11/12/2007 Document Revised: 10/10/2013 Document Reviewed: 12/23/2012 [facility] Patient Information 2015 [place], [place]. This information is not intended to replace advice given to you by your health care provider. Make sure you discuss any questions you have with your health care provider. Palpitations A palpitation is the feeling that your heartbeat is irregular or is faster than normal. It may feel like your heart is fluttering or skipping a beat. Palpitations are usually not a serious problem. However, in  some cases, you may need further medical evaluation. CAUSES  Palpitations can be caused by:  Smoking.  Caffeine or other stimulants, such as diet pills or energy drinks.  Alcohol.  Stress and anxiety.  Strenuous physical activity.  Fatigue.  Certain medicines.  Heart disease, especially if you have a history of irregular heart rhythms (arrhythmias), such as atrial fibrillation, atrial flutter, or supraventricular tachycardia.  An improperly working pacemaker or defibrillator. DIAGNOSIS  To find the cause of your palpitations, your health care provider will take your medical history and perform a physical exam. Your health care provider may also have you take a test called an ambulatory electrocardiogram (ECG). An ECG records your heartbeat patterns over a 24-hour period. You may also have other tests, such as:  Transthoracic echocardiogram (TTE). During echocardiography, sound waves are used to evaluate how blood flows through your heart.  Transesophageal echocardiogram (TEE).  Cardiac monitoring. This allows your health care provider to monitor your heart rate and rhythm in real time.  Holter monitor. This is a portable device that records your heartbeat and can help diagnose heart arrhythmias. It allows your health care provider to track your heart activity for several days, if needed.  Stress tests by exercise or by giving medicine that makes the heart beat faster. TREATMENT  Treatment of palpitations depends on the cause of your symptoms and can vary greatly. Most cases of palpitations do not require any treatment other than time, relaxation, and monitoring your symptoms. Other causes, such as atrial fibrillation, atrial flutter, or supraventricular tachycardia, usually require further treatment. HOME CARE INSTRUCTIONS   Avoid:  Caffeinated coffee, tea, soft drinks, diet pills, and energy drinks.  Chocolate.  Alcohol.  Stop smoking if you smoke.  Reduce your stress  and anxiety. Things that can help you relax include:  A method of controlling things in your body, such as your heartbeats, with your mind (biofeedback).  Yoga.  Meditation.  Physical activity such as swimming, jogging, or walking.  Get plenty of rest and sleep. SEEK MEDICAL CARE IF:   You continue to have a fast or irregular heartbeat beyond 24 hours.  Your palpitations occur more often. SEEK IMMEDIATE MEDICAL CARE IF:  You have chest pain or shortness of breath.  You have a severe headache.  You feel dizzy or you faint. MAKE SURE YOU:  Understand these instructions.  Will watch your condition.  Will get help right away if you are not doing well or get worse. Document Released: 05/23/2000 Document Revised: 05/31/2013 Document Reviewed: 07/25/2011 [facility] Patient Information 2015 [place], [place]. This information is not intended to replace advice given to you by your health care provider. Make sure you discuss any questions you have with your health care provider.

## 2014-01-03 NOTE — Discharge Instructions (Signed)
Foot Sprain The muscles and cord like structures which attach muscle to bone (tendons) that surround the feet are made up of units. A foot sprain can occur at the weakest spot in any of these units. This condition is most often caused by injury to or overuse of the foot, as from playing contact sports, or aggravating a previous injury, or from poor conditioning, or obesity. SYMPTOMS  Pain with movement of the foot.  Tenderness and swelling at the injury site.  Loss of strength is present in moderate or severe sprains. THE THREE GRADES OR SEVERITY OF FOOT SPRAIN ARE:  Mild (Grade I): Slightly pulled muscle without tearing of muscle or tendon fibers or loss of strength.  Moderate (Grade II): Tearing of fibers in a muscle, tendon, or at the attachment to bone, with small decrease in strength.  Severe (Grade III): Rupture of the muscle-tendon-bone attachment, with separation of fibers. Severe sprain requires surgical repair. Often repeating (chronic) sprains are caused by overuse. Sudden (acute) sprains are caused by direct injury or over-use. DIAGNOSIS  Diagnosis of this condition is usually by your own observation. If problems continue, a caregiver may be required for further evaluation and treatment. X-rays may be required to make sure there are not breaks in the bones (fractures) present. Continued problems may require physical therapy for treatment. PREVENTION  Use strength and conditioning exercises appropriate for your sport.  Warm up properly prior to working out.  Use athletic shoes that are made for the sport you are participating in.  Allow adequate time for healing. Early return to activities makes repeat injury more likely, and can lead to an unstable arthritic foot that can result in prolonged disability. Mild sprains generally heal in 3 to 10 days, with moderate and severe sprains taking 2 to 10 weeks. Your caregiver can help you determine the proper time required for  healing. HOME CARE INSTRUCTIONS   Apply ice to the injury for 15-20 minutes, 03-04 times per day. Put the ice in a plastic bag and place a towel between the bag of ice and your skin.  An elastic wrap (like an Ace bandage) may be used to keep swelling down.  Keep foot above the level of the heart, or at least raised on a footstool, when swelling and pain are present.  Try to avoid use other than gentle range of motion while the foot is painful. Do not resume use until instructed by your caregiver. Then begin use gradually, not increasing use to the point of pain. If pain does develop, decrease use and continue the above measures, gradually increasing activities that do not cause discomfort, until you gradually achieve normal use.  Use crutches if and as instructed, and for the length of time instructed.  Keep injured foot and ankle wrapped between treatments.  Massage foot and ankle for comfort and to keep swelling down. Massage from the toes up towards the knee.  Only take over-the-counter or prescription medicines for pain, discomfort, or fever as directed by your caregiver. SEEK IMMEDIATE MEDICAL CARE IF:   Your pain and swelling increase, or pain is not controlled with medications.  You have loss of feeling in your foot or your foot turns cold or blue.  You develop new, unexplained symptoms, or an increase of the symptoms that brought you to your caregiver. MAKE SURE YOU:   Understand these instructions.  Will watch your condition.  Will get help right away if you are not doing well or get worse. Document Released:   11/15/2001 Document Revised: 08/18/2011 Document Reviewed: 01/13/2008 [employer] Patient Information 2015 [employer]. This information is not intended to replace advice given to you by your health care provider. Make sure you discuss any questions you have with your health care provider.  

## 2014-01-03 NOTE — ED Provider Notes (Signed)
CSN: [id number]     Arrival date & time 01/03/14  0455 History   First MD Initiated Contact with Patient 01/03/14 [id number]     Chief Complaint  Patient presents with  . Palpitations  . Nausea  . Chest Pain  . Tachycardia     (Consider location/radiation/quality/duration/timing/severity/associated sxs/prior Treatment) Patient is a 23 y.o. male presenting with palpitations and chest pain. The history is provided by the patient. No language interpreter was used.  Palpitations Palpitations quality:  Fast Timing:  Constant Associated symptoms: chest pain and nausea   Associated symptoms: no shortness of breath and no vomiting   Associated symptoms comment:  Onset last night of pounding heartrate and chest discomfort. He denies drug use, fever, cough. He had nausea without vomiting. He denies SOB but also states that he felt like he was having an exacerbation of his asthma. He did not have an inhaler to use with him and came here for evaluation. Chest Pain Associated symptoms: nausea and palpitations   Associated symptoms: no abdominal pain, no fever, no headache, no shortness of breath and not vomiting     Past Medical History  Diagnosis Date  . Asthma   . ADHD (attention deficit hyperactivity disorder)   . Bipolar 1 disorder   . GERD (gastroesophageal reflux disease)   . Depression   . Anxiety   . Mental developmental delay 11/15/2012  . PONV (postoperative nausea and vomiting)   . Hearing loss   . HOH (hard of hearing)   . Abdominal pain, chronic, right upper quadrant   . Colitis     possible Crohn's, seen on colonoscopy 08/30/2013   Past Surgical History  Procedure Laterality Date  . Tonsillectomy    . Adenoidectomy    . Esophagogastroduodenoscopy (egd) with propofol N/A 12/07/2012    SLF:The mucosa of the esophagus appeared normal Non-erosive gastritis (inflammation) was found in the gastric antrum; multiple biopsies The duodenal mucosa showed no abnormalities in the bulb and  second portion of the duodenum  . Esophageal biopsy N/A 12/07/2012    Procedure: GASTRIC BIOPSIES;  Surgeon: [name], MD;  Location: [facility];  Service: Endoscopy;  Laterality: N/A;  . Colonoscopy with propofol N/A 08/30/2013    QHU:TMLYYT mucosa in the terminal ileum/COLITIS/ MILD PROCTITIS. 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  . Esophageal biopsy N/A 08/30/2013    Procedure: BIOPSY;  Surgeon: [name], MD;  Location: [facility];  Service: Endoscopy;  Laterality: N/A;  right colon,transverse colon, left colon, rectal biopsies   Family History  Problem Relation Age of Onset  . Asthma Mother   . Ulcers Mother   . Bipolar disorder Mother   . Colon cancer Neg Hx   . Liver disease Neg Hx   . ADD / ADHD Father   . Diabetes Maternal Grandmother   . Diabetes Maternal Grandfather    History  Substance Use Topics  . Smoking status: Former Smoker -- 1.00 packs/day for 2 years    Types: Cigarettes    Quit date: 10/06/2012  . Smokeless tobacco: Never Used  . Alcohol Use: No    Review of Systems  Constitutional: Negative for fever.  HENT: Negative for congestion.   Respiratory: Negative for shortness of breath.   Cardiovascular: Positive for chest pain and palpitations.  Gastrointestinal: Positive for nausea. Negative for vomiting and abdominal pain.  Musculoskeletal: Negative for myalgias.  Neurological: Negative for syncope  and headaches.      Allergies  Amoxicillin-pot clavulanate; Omeprazole; Pineapple; Strawberry; and Tomato  Home Medications   Prior to Admission medications   Medication Sig Start Date End Date Taking? Authorizing Provider  montelukast (SINGULAIR) 10 MG tablet Take 1 tablet (10 mg total) by mouth at bedtime. 09/21/13  Yes Sandi Mealy, MD  OLANZapine (ZYPREXA PO) Take 1 tablet by mouth daily.   Yes Historical Provider, MD  sertraline  (ZOLOFT) 50 MG tablet Take 50 mg by mouth daily.   Yes Historical Provider, MD  albuterol (PROAIR HFA) 108 (90 BASE) MCG/ACT inhaler Inhale 2 puffs into the lungs every 4 (four) hours as needed for wheezing. Persistent dry cough 09/21/13   Sandi Mealy, MD  albuterol (PROVENTIL) (2.5 MG/3ML) 0.083% nebulizer solution Take 2.5 mg by nebulization every 6 (six) hours as needed. For shortness of breath    Historical Provider, MD  atomoxetine (STRATTERA) 100 MG capsule Take 100 mg by mouth every morning.    Historical Provider, MD  cloNIDine (CATAPRES) 0.3 MG tablet Take 0.3 mg by mouth daily.    Historical Provider, MD  EPINEPHrine (EPIPEN) 0.3 mg/0.3 mL DEVI Inject 0.3 mLs (0.3 mg total) into the muscle once. 11/15/12   Garvin Fila, MD  loratadine (CLARITIN) 10 MG tablet Take 10 mg by mouth at bedtime.    Historical Provider, MD  loratadine (CLARITIN) 10 MG tablet TAKE 1 TABLET BY MOUTH EVERY NIGHT AT BEDTIME 10/03/13   Sandi Mealy, MD  mesalamine (APRISO) 0.375 G 24 hr capsule Take 4 capsules (1.5 g total) by mouth daily. 09/29/13   [name], MD  methylphenidate (CONCERTA) 54 MG CR tablet Take 54 mg by mouth every morning.    Historical Provider, MD  predniSONE (DELTASONE) 10 MG tablet Take 3 daily for 7 days, then 2 daily for 7 days, then 1 daily for 7 days. 11/28/13   Mahala Menghini, PA-C  ranitidine (ZANTAC) 150 MG tablet Take 150 mg by mouth daily as needed for heartburn.    Historical Provider, MD  ziprasidone (GEODON) 80 MG capsule Take 80 mg by mouth at bedtime.    Historical Provider, MD  zolpidem (AMBIEN) 5 MG tablet Take 5 mg by mouth at bedtime as needed for sleep.    Historical Provider, MD   BP 111/72  Pulse 81  Temp(Src) 98.1 F (36.7 C) (Oral)  Resp 7  SpO2 98% Physical Exam  Constitutional: He is oriented to person, place, and time. He appears well-developed and well-nourished.  Initially sleeping, comfortable appearing. Easily awakened.   HENT:  Head:  Normocephalic.  Neck: Normal range of motion. Neck supple.  Cardiovascular: Normal rate and regular rhythm.   No murmur heard. Pulmonary/Chest: Effort normal and breath sounds normal. He has no wheezes. He has no rales. He exhibits no tenderness.  Abdominal: Soft. Bowel sounds are normal. There is no tenderness. There is no rebound and no guarding.  Musculoskeletal: Normal range of motion.  Neurological: He is alert and oriented to person, place, and time.  Skin: Skin is warm and dry. No rash noted.  Psychiatric: He has a normal mood and affect.    ED Course  Procedures (including critical care time) Labs Review Labs Reviewed  CBC WITH DIFFERENTIAL - Abnormal; Notable for the following:    WBC 12.5 (*)    Neutro Abs 8.9 (*)    All other components within normal limits  COMPREHENSIVE METABOLIC PANEL - Abnormal; Notable for the following:  Glucose, Bld 108 (*)    GFR calc non Af Amer 83 (*)    Anion gap 19 (*)    All other components within normal limits  TROPONIN I    Imaging Review Dg Chest Port 1 View  01/03/2014   CLINICAL DATA:  Palpitations, tachycardia.  EXAM: PORTABLE CHEST - 1 VIEW  COMPARISON:  Acute abdominal series September 20, 2013.  FINDINGS: Cardiomediastinal silhouette is unremarkable. The lungs are clear without pleural effusions or focal consolidations. Trachea projects midline and there is no pneumothorax. Soft tissue planes and included osseous structures are non-suspicious.  IMPRESSION: Normal chest.   Electronically Signed   By: Elon Alas   On: 01/03/2014 05:31     EKG Interpretation   Date/Time:  Tuesday January 03 2014 05:11:43 EDT Ventricular Rate:  98 PR Interval:  135 QRS Duration: 84 QT Interval:  326 QTC Calculation: 416 R Axis:   67 Text Interpretation:  Sinus rhythm No significant change since last  tracing Confirmed by OTTER  MD, OLGA ([id number]) on 01/03/2014 5:38:22 AM      MDM   Final diagnoses:  None    1. Palpitations 2.  Asthma  He feels better now, no pain or palpitation. He reports that, even though he denies SOB, that symptoms reminded him of previous asthma exacerbations. Inhaler provided. Stable for discharge.     Dewaine Oats, PA-C 01/03/14 [phone]

## 2014-01-03 NOTE — ED Notes (Signed)
Alert, NAD, calm, passively interactive, "feels better". Denies pain or other sx.

## 2014-01-03 NOTE — ED Notes (Signed)
Pt woke up with right foot pain and swelling to side of foot, denies injury to foot, able to wiggle toes but less movement with right great toe; also c/o painful rash across lower back noted this morning, denies itching, denies fever or N/V/D

## 2014-01-03 NOTE — ED Notes (Signed)
Pt co knot on rt foot and rash to lower back, denies injury.

## 2014-01-03 NOTE — ED Notes (Signed)
Pt attempting to call/speak with mother by phone. Pt remains alert, NAD, calm, interactive, skin W&D, resps e/u, speaking in clear complete sentences, no dyspnea noted. VSS, LS CTA.  Chest xray at [facility]

## 2014-01-03 NOTE — ED Notes (Addendum)
Here by [facility], called out for palpitations/CP. 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 Sx onset ~ 1 hr PTA. "not from here, came to visit friend in [place] when his other friends left him,  (denies: ETOH or drug use), mentions cough for ~ 30 minutes, EMS gave ASA 381m, and ntg x1, pt "reports that pain improved".

## 2014-01-04 ENCOUNTER — Encounter: Payer: Self-pay | Admitting: Gastroenterology

## 2014-01-04 NOTE — ED Provider Notes (Signed)
Medical screening examination/treatment/procedure(s) were conducted as a shared visit with non-physician practitioner(s) or resident  and myself.  I personally evaluated the patient during the encounter and agree with the findings.   I have personally reviewed any xrays and/ or EKG's with the provider and I agree with interpretation.   Patient with mild palpitations and mild chest tightness, patient says feels identical asthma history. On exam patient's lungs clear, no increased work of breathing, abdomen soft nontender, regular rate and rhythm. PERC neg, no concern for blood clot at this time, cxr reviewed unremarkable. Fup outpt.   Results and differential diagnosis were discussed with the patient/parent/guardian. Close follow up outpatient was discussed, comfortable with the plan.   Medications - No data to display  Filed Vitals:   01/03/14 0600 01/03/14 0615 01/03/14 0630 01/03/14 0645  BP: 112/74 111/72 108/72 130/76  Pulse: 88 81 80 77  Temp:      TempSrc:      Resp:      SpO2: 96% 98% 98% 96%       Mariea Clonts, MD 01/04/14 [phone]

## 2014-01-04 NOTE — ED Provider Notes (Signed)
  Medical screening examination/treatment/procedure(s) were performed by non-physician practitioner and as supervising physician I was immediately available for consultation/collaboration.   EKG Interpretation None         Carmin Muskrat, MD 01/04/14 [phone]

## 2014-01-23 NOTE — ED Provider Notes (Signed)
Medical screening examination/treatment/procedure(s) were performed by non-physician practitioner and as supervising physician I was immediately available for consultation/collaboration.    Carmin Muskrat, MD 01/23/14 [phone]

## 2014-01-30 ENCOUNTER — Ambulatory Visit: Payer: Medicaid Other | Admitting: Diagnostic Neuroimaging

## 2014-02-01 ENCOUNTER — Encounter (HOSPITAL_COMMUNITY): Payer: Self-pay | Admitting: Emergency Medicine

## 2014-02-01 ENCOUNTER — Emergency Department (HOSPITAL_COMMUNITY)
Admission: EM | Admit: 2014-02-01 | Discharge: 2014-02-01 | Disposition: A | Discharge status: Home / Self Care | Payer: Medicaid Other | Attending: Emergency Medicine | Admitting: Emergency Medicine

## 2014-02-01 DIAGNOSIS — F411 Generalized anxiety disorder: Secondary | ICD-10-CM | POA: Diagnosis not present

## 2014-02-01 DIAGNOSIS — B370 Candidal stomatitis: Secondary | ICD-10-CM | POA: Insufficient documentation

## 2014-02-01 DIAGNOSIS — IMO0002 Reserved for concepts with insufficient information to code with codable children: Secondary | ICD-10-CM | POA: Insufficient documentation

## 2014-02-01 DIAGNOSIS — Z79899 Other long term (current) drug therapy: Secondary | ICD-10-CM | POA: Diagnosis not present

## 2014-02-01 DIAGNOSIS — Z8669 Personal history of other diseases of the nervous system and sense organs: Secondary | ICD-10-CM | POA: Insufficient documentation

## 2014-02-01 DIAGNOSIS — K219 Gastro-esophageal reflux disease without esophagitis: Secondary | ICD-10-CM | POA: Diagnosis not present

## 2014-02-01 DIAGNOSIS — R1310 Dysphagia, unspecified: Secondary | ICD-10-CM | POA: Diagnosis present

## 2014-02-01 DIAGNOSIS — F909 Attention-deficit hyperactivity disorder, unspecified type: Secondary | ICD-10-CM | POA: Diagnosis not present

## 2014-02-01 DIAGNOSIS — Z87891 Personal history of nicotine dependence: Secondary | ICD-10-CM | POA: Diagnosis not present

## 2014-02-01 DIAGNOSIS — J45909 Unspecified asthma, uncomplicated: Secondary | ICD-10-CM | POA: Diagnosis not present

## 2014-02-01 DIAGNOSIS — Z880 Allergy status to penicillin: Secondary | ICD-10-CM | POA: Diagnosis not present

## 2014-02-01 DIAGNOSIS — F319 Bipolar disorder, unspecified: Secondary | ICD-10-CM | POA: Insufficient documentation

## 2014-02-01 MED ORDER — NYSTATIN 100000 UNIT/ML MT SUSP: 500000.0000 [IU] | Freq: Four times a day (QID) | OROMUCOSAL | Status: DC

## 2014-02-01 NOTE — ED Provider Notes (Signed)
CSN: [id number]     Arrival date & time 02/01/14  1600 History  This chart was scribed for Tanna Furry, MD by Randa Evens, ED Scribe. This patient was seen in room [facility] and the patient's care was started at 5:07 PM.     Chief Complaint  Patient presents with  . Dysphagia   The history is provided by the patient. No language interpreter was used.   HPI Comments: Wayne Avila is a 23 y.o. male who presents to the Emergency Department complaining of dysphagia onset today and 1:30PM. He states that he noticed his symptoms when he woke up from a nap. He states that he is having a difficult time swallowing. He sates that any food or liquid he tries to swallow down at a slower rate than normal.  He denies having a sore throat or fever. Mother states that he takes Zantac for his GERD. Mother states he has a Hx ulcerated colitis and has been taken Apriso for the past 3 months.    Past Medical History  Diagnosis Date  . Asthma   . ADHD (attention deficit hyperactivity disorder)   . Bipolar 1 disorder   . GERD (gastroesophageal reflux disease)   . Depression   . Anxiety   . Mental developmental delay 11/15/2012  . PONV (postoperative nausea and vomiting)   . Hearing loss   . HOH (hard of hearing)   . Abdominal pain, chronic, right upper quadrant   . Colitis     possible Crohn's, seen on colonoscopy 08/30/2013   Past Surgical History  Procedure Laterality Date  . Tonsillectomy    . Adenoidectomy    . Esophagogastroduodenoscopy (egd) with propofol N/A 12/07/2012    SLF:The mucosa of the esophagus appeared normal Non-erosive gastritis (inflammation) was found in the gastric antrum; multiple biopsies The duodenal mucosa showed no abnormalities in the bulb and second portion of the duodenum  . Esophageal biopsy N/A 12/07/2012    Procedure: GASTRIC BIOPSIES;  Surgeon: [name], MD;  Location: [facility];  Service: Endoscopy;  Laterality: N/A;  . Colonoscopy with propofol N/A 08/30/2013   TKP:TWSFKC mucosa in the terminal ileum/COLITIS/ MILD PROCTITIS. 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  . Esophageal biopsy N/A 08/30/2013    Procedure: BIOPSY;  Surgeon: [name], MD;  Location: [facility];  Service: Endoscopy;  Laterality: N/A;  right colon,transverse colon, left colon, rectal biopsies   Family History  Problem Relation Age of Onset  . Asthma Mother   . Ulcers Mother   . Bipolar disorder Mother   . Colon cancer Neg Hx   . Liver disease Neg Hx   . ADD / ADHD Father   . Diabetes Maternal Grandmother   . Diabetes Maternal Grandfather    History  Substance Use Topics  . Smoking status: Former Smoker -- 1.00 packs/day for 2 years    Types: Cigarettes    Quit date: 10/06/2012  . Smokeless tobacco: Never Used  . Alcohol Use: No    Review of Systems  Constitutional: Negative for fever, chills, diaphoresis, appetite change and fatigue.  HENT: Positive for trouble swallowing. Negative for mouth sores and sore throat.   Eyes: Negative for visual disturbance.  Respiratory: Negative for cough, chest tightness, shortness of breath and wheezing.   Cardiovascular: Negative for chest pain.  Gastrointestinal: Negative for nausea, vomiting, abdominal pain, diarrhea and abdominal distention.  Endocrine: Negative for  polydipsia, polyphagia and polyuria.  Genitourinary: Negative for dysuria, frequency and hematuria.  Musculoskeletal: Negative for gait problem.  Skin: Negative for color change, pallor and rash.  Neurological: Negative for dizziness, syncope, light-headedness and headaches.  Hematological: Does not bruise/bleed easily.  Psychiatric/Behavioral: Negative for behavioral problems and confusion.    Allergies  Amoxicillin-pot clavulanate; Omeprazole; Pineapple; Strawberry; and Tomato  Home Medications   Prior to Admission medications   Medication Sig  Start Date End Date Taking? Authorizing Provider  albuterol (PROAIR HFA) 108 (90 BASE) MCG/ACT inhaler Inhale 2 puffs into the lungs every 4 (four) hours as needed for wheezing. Persistent dry cough 09/21/13   Sandi Mealy, MD  albuterol (PROVENTIL) (2.5 MG/3ML) 0.083% nebulizer solution Take 2.5 mg by nebulization every 6 (six) hours as needed. For shortness of breath    Historical Provider, MD  atomoxetine (STRATTERA) 100 MG capsule Take 100 mg by mouth every morning.    Historical Provider, MD  cloNIDine (CATAPRES) 0.3 MG tablet Take 0.3 mg by mouth daily.    Historical Provider, MD  EPINEPHrine (EPIPEN) 0.3 mg/0.3 mL DEVI Inject 0.3 mLs (0.3 mg total) into the muscle once. 11/15/12   Garvin Fila, MD  loratadine (CLARITIN) 10 MG tablet Take 10 mg by mouth at bedtime.    Historical Provider, MD  loratadine (CLARITIN) 10 MG tablet TAKE 1 TABLET BY MOUTH EVERY NIGHT AT BEDTIME 10/03/13   Sandi Mealy, MD  mesalamine (APRISO) 0.375 G 24 hr capsule Take 4 capsules (1.5 g total) by mouth daily. 09/29/13   [name], MD  methylphenidate (CONCERTA) 54 MG CR tablet Take 54 mg by mouth every morning.    Historical Provider, MD  montelukast (SINGULAIR) 10 MG tablet Take 1 tablet (10 mg total) by mouth at bedtime. 09/21/13   Sandi Mealy, MD  predniSONE (DELTASONE) 10 MG tablet Take 3 daily for 7 days, then 2 daily for 7 days, then 1 daily for 7 days. 11/28/13   Mahala Menghini, PA-C  ranitidine (ZANTAC) 150 MG tablet Take 150 mg by mouth daily as needed for heartburn.    Historical Provider, MD  sertraline (ZOLOFT) 50 MG tablet Take 50 mg by mouth daily.    Historical Provider, MD  ziprasidone (GEODON) 80 MG capsule Take 80 mg by mouth at bedtime.    Historical Provider, MD  zolpidem (AMBIEN) 5 MG tablet Take 5 mg by mouth at bedtime as needed for sleep.    Historical Provider, MD   Triage Vitals: BP 126/82  Pulse 80  Temp(Src) 98.8 F (37.1 C) (Oral)  Resp 18  Ht 5' 8"  (1.727 m)  Wt  177 lb 9.6 oz (80.559 kg)  BMI 27.01 kg/m2  SpO2 100%  Physical Exam  Constitutional: He is oriented to person, place, and time. He appears well-developed and well-nourished. No distress.  HENT:  Head: Normocephalic.  Red papules on solf palate, minimal pharyngeal thrush   Eyes: Conjunctivae are normal. Pupils are equal, round, and reactive to light. No scleral icterus.  Neck: Normal range of motion. Neck supple. No thyromegaly present.  Cardiovascular: Normal rate and regular rhythm.  Exam reveals no gallop and no friction rub.   No murmur heard. Pulmonary/Chest: Effort normal and breath sounds normal. No respiratory distress. He has no wheezes. He has no rales.  Abdominal: Soft. Bowel sounds are normal. He exhibits no distension. There is no tenderness. There is no rebound.  Musculoskeletal: Normal range of motion.  Neurological: He is alert and  oriented to person, place, and time.  Skin: Skin is warm and dry. No rash noted.  Psychiatric: He has a normal mood and affect. His behavior is normal.    ED Course  Procedures (including critical care time) DIAGNOSTIC STUDIES: Oxygen Saturation is 100% on RA, normal by my interpretation.    COORDINATION OF CARE:    Labs Review Labs Reviewed - No data to display  Imaging Review No results found.   EKG Interpretation None      MDM   Final diagnoses:  Odynophagia  Thrush     Patient has oral thrush. Complains of pain with swallowing. Plan will be to a statin. Up with his GI doctor Dr. [name].  I personally performed the services described in this documentation, which was scribed in my presence. The recorded information has been reviewed and is accurate.      Tanna Furry, MD 02/01/14 [phone]

## 2014-02-01 NOTE — ED Notes (Signed)
Mother reported pt work up today at 72 and has had difficulty swallowing and trouble speaking clearly. PT has garbled speech and denies any pain to throat. Pt says he feels as if his tongue in swollen.

## 2014-02-01 NOTE — Discharge Instructions (Signed)
Dysphagia Swallowing problems (dysphagia) occur when solids and liquids seem to stick in your throat on the way down to your stomach, or the food takes longer to get to the stomach. Other symptoms include regurgitating food, noises coming from the throat, chest discomfort with swallowing, and a feeling of fullness or the feeling of something being stuck in your throat when swallowing. When blockage in your throat is complete, it may be associated with drooling. CAUSES  Problems with swallowing may occur because of problems with the muscles. The food cannot be propelled in the usual manner into your stomach. You may have ulcers, scar tissue, or inflammation in the tube down which food travels from your mouth to your stomach (esophagus), which blocks food from passing normally into the stomach. Causes of inflammation include:  Acid reflux from your stomach into your esophagus.  Infection.  Radiation treatment for cancer.  Medicines taken without enough fluids to wash them down into your stomach. You may have nerve problems that prevent signals from being sent to the muscles of your esophagus to contract and move your food down to your stomach. Globus pharyngeus is a relatively common problem in which there is a sense of an obstruction or difficulty in swallowing, without any physical abnormalities of the swallowing passages being found. This problem usually improves over time with reassurance and testing to rule out other causes. DIAGNOSIS Dysphagia can be diagnosed and its cause can be determined by tests in which you swallow a white substance that helps illuminate the inside of your throat (contrast medium) while X-rays are taken. Sometimes a flexible telescope that is inserted down your throat (endoscopy) to look at your esophagus and stomach is used. TREATMENT   If the dysphagia is caused by acid reflux or infection, medicines may be used.  If the dysphagia is caused by problems with your  swallowing muscles, swallowing therapy may be used to help you strengthen your swallowing muscles.  If the dysphagia is caused by a blockage or mass, procedures to remove the blockage may be done. HOME CARE INSTRUCTIONS  Try to eat soft food that is easier to swallow and check your weight on a daily basis to be sure that it is not decreasing.  Be sure to drink liquids when sitting upright (not lying down). SEEK MEDICAL CARE IF:  You are losing weight because you are unable to swallow.  You are coughing when you drink liquids (aspiration).  You are coughing up partially digested food. SEEK IMMEDIATE MEDICAL CARE IF:  You are unable to swallow your own saliva .  You are having shortness of breath or a fever, or both.  You have a hoarse voice along with difficulty swallowing. MAKE SURE YOU:  Understand these instructions.  Will watch your condition.  Will get help right away if you are not doing well or get worse. Document Released: 05/23/2000 Document Revised: 10/10/2013 Document Reviewed: 11/12/2012 [facility] Patient Information 2015 [place], [place]. This information is not intended to replace advice given to you by your health care provider. Make sure you discuss any questions you have with your health care provider.  Candida Infection A Candida infection (also called yeast, fungus, and Monilia infection) is an overgrowth of yeast that can occur anywhere on the body. A yeast infection commonly occurs in warm, moist body areas. Usually, the infection remains localized but can spread to become a systemic infection. A yeast infection may be a sign of a more severe disease such as diabetes, leukemia, or AIDS.  A yeast infection can occur in both men and women. In women, Candida vaginitis is a vaginal infection. It is one of the most common causes of vaginitis. Men usually do not have symptoms or know they have an infection until other problems develop. Men may find out they have a yeast  infection because their sex partner has a yeast infection. Uncircumcised men are more likely to get a yeast infection than circumcised men. This is because the uncircumcised glans is not exposed to air and does not remain as dry as that of a circumcised glans. Older adults may develop yeast infections around dentures. CAUSES  Women  Antibiotics.  Steroid medication taken for a long time.  Being overweight (obese).  Diabetes.  Poor immune condition.  Certain serious medical conditions.  Immune suppressive medications for organ transplant patients.  Chemotherapy.  Pregnancy.  Menstruation.  Stress and fatigue.  Intravenous drug use.  Oral contraceptives.  Wearing tight-fitting clothes in the crotch area.  Catching it from a sex partner who has a yeast infection.  Spermicide.  Intravenous, urinary, or other catheters. Men  Catching it from a sex partner who has a yeast infection.  Having oral or anal sex with a person who has the infection.  Spermicide.  Diabetes.  Antibiotics.  Poor immune system.  Medications that suppress the immune system.  Intravenous drug use.  Intravenous, urinary, or other catheters. SYMPTOMS  Women  Thick, white vaginal discharge.  Vaginal itching.  Redness and swelling in and around the vagina.  Irritation of the lips of the vagina and perineum.  Blisters on the vaginal lips and perineum.  Painful sexual intercourse.  Low blood sugar (hypoglycemia).  Painful urination.  Bladder infections.  Intestinal problems such as constipation, indigestion, bad breath, bloating, increase in gas, diarrhea, or loose stools. Men  Men may develop intestinal problems such as constipation, indigestion, bad breath, bloating, increase in gas, diarrhea, or loose stools.  Dry, cracked skin on the penis with itching or discomfort.  Jock itch.  Dry, flaky skin.  Athlete's foot.  Hypoglycemia. DIAGNOSIS  Women  A history and  an exam are performed.  The discharge may be examined under a microscope.  A culture may be taken of the discharge. Men  A history and an exam are performed.  Any discharge from the penis or areas of cracked skin will be looked at under the microscope and cultured.  Stool samples may be cultured. TREATMENT  Women  Vaginal antifungal suppositories and creams.  Medicated creams to decrease irritation and itching on the outside of the vagina.  Warm compresses to the perineal area to decrease swelling and discomfort.  Oral antifungal medications.  Medicated vaginal suppositories or cream for repeated or recurrent infections.  Wash and dry the irritation areas before applying the cream.  Eating yogurt with Lactobacillus may help with prevention and treatment.  Sometimes painting the vagina with gentian violet solution may help if creams and suppositories do not work. Men  Antifungal creams and oral antifungal medications.  Sometimes treatment must continue for 30 days after the symptoms go away to prevent recurrence. HOME CARE INSTRUCTIONS  Women  Use cotton underwear and avoid tight-fitting clothing.  Avoid colored, scented toilet paper and deodorant tampons or pads.  Do not douche.  Keep your diabetes under control.  Finish all the prescribed medications.  Keep your skin clean and dry.  Consume milk or yogurt with Lactobacillus-active culture regularly. If you get frequent yeast infections and think that is what  the infection is, there are over-the-counter medications that you can get. If the infection does not show healing in 3 days, talk to your caregiver.  Tell your sex partner you have a yeast infection. Your partner may need treatment also, especially if your infection does not clear up or recurs. Men  Keep your skin clean and dry.  Keep your diabetes under control.  Finish all prescribed medications.  Tell your sex partner that you have a yeast  infection so he or she can be treated if necessary. SEEK MEDICAL CARE IF:   Your symptoms do not clear up or worsen in one week after treatment.  You have an oral temperature above 102 F (38.9 C).  You have trouble swallowing or eating for a prolonged time.  You develop blisters on and around your vagina.  You develop vaginal bleeding and it is not your menstrual period.  You develop abdominal pain.  You develop intestinal problems as mentioned above.  You get weak or light-headed.  You have painful or increased urination.  You have pain during sexual intercourse. MAKE SURE YOU:   Understand these instructions.  Will watch your condition.  Will get help right away if you are not doing well or get worse. Document Released: 07/03/2004 Document Revised: 10/10/2013 Document Reviewed: 10/15/2009 [facility] Patient Information 2015 [place], [place]. This information is not intended to replace advice given to you by your health care provider. Make sure you discuss any questions you have with your health care provider.

## 2014-02-08 ENCOUNTER — Encounter: Payer: Self-pay | Admitting: Gastroenterology

## 2014-02-23 ENCOUNTER — Ambulatory Visit (INDEPENDENT_AMBULATORY_CARE_PROVIDER_SITE_OTHER): Payer: Medicaid Other | Admitting: Gastroenterology

## 2014-02-23 ENCOUNTER — Encounter: Payer: Self-pay | Admitting: Gastroenterology

## 2014-02-23 ENCOUNTER — Ambulatory Visit: Payer: Medicaid Other | Admitting: Gastroenterology

## 2014-02-23 VITALS — BP 146/99 | HR 80 | Temp 99.3°F | Ht 67.0 in | Wt 180.0 lb

## 2014-02-23 DIAGNOSIS — B370 Candidal stomatitis: Secondary | ICD-10-CM | POA: Insufficient documentation

## 2014-02-23 DIAGNOSIS — K5190 Ulcerative colitis, unspecified, without complications: Secondary | ICD-10-CM

## 2014-02-23 MED ORDER — NYSTATIN 100000 UNIT/ML MT SUSP: 5.0000 mL | Freq: Four times a day (QID) | OROMUCOSAL | Status: DC

## 2014-02-23 NOTE — Patient Instructions (Signed)
I have sent a refill for nystatin suspension to your pharmacy. Take this 4 times a day for the next 2-3 weeks. You need to continue it for at least 48 hours after symptoms resolve.   Please call in 1 week with an update. If you do not notice improvement, we may need to proceed with another upper endoscopy.

## 2014-02-23 NOTE — Assessment & Plan Note (Signed)
No rectal bleeding or loose stools. Continue Apriso.

## 2014-02-23 NOTE — Assessment & Plan Note (Signed)
Noted during ED visit late August. Nystatin course completed. Patient quite a poor historian and angry regarding personal psychosocial issues, so history is limited. Mother provides the majority of history. Some improvement overall in dysphagia, no odynophagia. Will repeat course of Nystatin; patient to call in 1 week. If no improvement, repeat EGD indicated.

## 2014-02-23 NOTE — Progress Notes (Signed)
Referring Provider: Yevette Edwards, NP Primary Care Physician:  Yevette Edwards, NP Primary GI: Dr. [name]   Chief Complaint  Patient presents with  . Abdominal Pain    HPI:   Wayne Avila presents today with a history of UC. 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 Biopsies could not delineate between Crohn's and ulcerative colitis. [facility] lab test ordered and c/w UC per Dr. Nona Dell note. Prednisone taper provided in June 2015 due to flare. Unable to afford Uceris. Doing well from an IBD standpoint.  No loose stools. No rectal bleeding.   Seen in the ED on 8/26 with dysphagia. Oral thrush noted on exam. Odynophagia. Started on Nystatin with some improvement in symptoms overall. Patient does not answer questions readily; mother at side and answers. Patient quite irritated about "baby mama drama" on facebook. Will not answer my questions directly. Originally stated dysphagia had improved but then towards end of visit states it is still present. No odynophagia. Decreased appetite, chronic. Difficult to obtain any information.   Past Medical History  Diagnosis Date  . Asthma   . ADHD (attention deficit hyperactivity disorder)   . Bipolar 1 disorder   . GERD (gastroesophageal reflux disease)   . Depression   . Anxiety   . Mental developmental delay 11/15/2012  . PONV (postoperative nausea and vomiting)   . Hearing loss   . HOH (hard of hearing)   . Abdominal pain, chronic, right upper quadrant   . Colitis     Past Surgical History  Procedure Laterality Date  . Tonsillectomy    . Adenoidectomy    . Esophagogastroduodenoscopy (egd) with propofol N/A 12/07/2012    SLF:The mucosa of the esophagus appeared normal Non-erosive gastritis (inflammation) was found in the gastric antrum; multiple biopsies The duodenal mucosa showed no abnormalities in the bulb and second portion of the duodenum  . Esophageal biopsy N/A 12/07/2012   Procedure: GASTRIC BIOPSIES;  Surgeon: [name], MD;  Location: [facility];  Service: Endoscopy;  Laterality: N/A;  . Colonoscopy with propofol N/A 08/30/2013    OZD:GUYQIH mucosa in the terminal ileum/COLITIS/ MILD PROCTITIS. 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  . Esophageal biopsy N/A 08/30/2013    Procedure: BIOPSY;  Surgeon: [name], MD;  Location: [facility];  Service: Endoscopy;  Laterality: N/A;  right colon,transverse colon, left colon, rectal biopsies    Current Outpatient Prescriptions  Medication Sig Dispense Refill  . albuterol (PROAIR HFA) 108 (90 BASE) MCG/ACT inhaler Inhale 2 puffs into the lungs every 4 (four) hours as needed for wheezing. Persistent dry cough  6.7 g  2  . albuterol (PROVENTIL) (2.5 MG/3ML) 0.083% nebulizer solution Take 2.5 mg by nebulization every 6 (six) hours as needed. For shortness of breath      . atomoxetine (STRATTERA) 100 MG capsule Take 100 mg by mouth every morning.      . cloNIDine (CATAPRES) 0.3 MG tablet Take 0.3 mg by mouth at bedtime.       Marland Kitchen EPINEPHrine (EPIPEN) 0.3 mg/0.3 mL DEVI Inject 0.3 mLs (0.3 mg total) into the muscle once.  1 Device  2  . loratadine (CLARITIN) 10 MG tablet Take 10 mg by mouth at bedtime.      . mesalamine (APRISO) 0.375 G 24 hr capsule Take 4 capsules (1.5 g total) by mouth daily.  124 capsule  11  .  methylphenidate (CONCERTA) 54 MG CR tablet Take 54 mg by mouth every morning.      . montelukast (SINGULAIR) 10 MG tablet Take 1 tablet (10 mg total) by mouth at bedtime.  30 tablet  3  . ranitidine (ZANTAC) 150 MG tablet Take 150 mg by mouth daily as needed for heartburn.      . sertraline (ZOLOFT) 50 MG tablet Take 50 mg by mouth daily.      . ziprasidone (GEODON) 80 MG capsule Take 80 mg by mouth at bedtime.      Marland Kitchen nystatin (MYCOSTATIN) 100000 UNIT/ML suspension Take 5 mLs (500,000 Units total) by  mouth 4 (four) times daily. Keep in mouth as long as possible before swallowing.  480 mL  1   No current facility-administered medications for this visit.    Allergies as of 02/23/2014 - Review Complete 02/23/2014  Allergen Reaction Noted  . Amoxicillin-pot clavulanate Nausea And Vomiting 02/28/2011  . Omeprazole Nausea And Vomiting 04/20/2013  . Pineapple Swelling 04/14/2011  . Strawberry Swelling 05/08/2012  . Tomato Rash 04/14/2011    Family History  Problem Relation Age of Onset  . Asthma Mother   . Ulcers Mother   . Bipolar disorder Mother   . Colon cancer Neg Hx   . Liver disease Neg Hx   . ADD / ADHD Father   . Diabetes Maternal Grandmother   . Diabetes Maternal Grandfather     History   Social History  . Marital Status: Single    Spouse Name: N/A    Number of Children: 0  . Years of Education: 12th   Occupational History  .      not working   Social History Main Topics  . Smoking status: Former Smoker -- 1.00 packs/day for 2 years    Types: Cigarettes    Quit date: 10/06/2012  . Smokeless tobacco: Never Used  . Alcohol Use: No  . Drug Use: No  . Sexual Activity: Yes   Other Topics Concern  . None   Social History Narrative   Patient lives at home with girlfriend and family.   Caffeine Use: occasionally    Review of Systems: As mentioned in HPI  Physical Exam: BP 146/99  Pulse 80  Temp(Src) 99.3 F (37.4 C) (Oral)  Ht 5' 7"  (1.702 m)  Wt 180 lb (81.647 kg)  BMI 28.19 kg/m2 General:   Alert and oriented. Angry-appearing. Flat affect. Difficult to obtain information.  Mouth:  Oral mucosa pink and moist. No significant evidence of persistent oral thrush Heart:  S1, S2 present without murmurs, rubs, or gallops. Regular rate and rhythm. Abdomen:  +BS, soft, non-tender and non-distended. No rebound or guarding. No HSM or masses noted. Msk:  Symmetrical without gross deformities. Normal posture. Extremities:  Without edema.

## 2014-02-28 NOTE — Progress Notes (Signed)
cc'ed to pcp °

## 2014-03-06 ENCOUNTER — Ambulatory Visit: Payer: Medicaid Other | Admitting: Diagnostic Neuroimaging

## 2014-03-07 ENCOUNTER — Emergency Department (HOSPITAL_COMMUNITY)
Admission: EM | Admit: 2014-03-07 | Discharge: 2014-03-07 | Disposition: A | Discharge status: Home / Self Care | Payer: Medicaid Other | Attending: Emergency Medicine | Admitting: Emergency Medicine

## 2014-03-07 ENCOUNTER — Encounter (HOSPITAL_COMMUNITY): Payer: Self-pay | Admitting: Emergency Medicine

## 2014-03-07 DIAGNOSIS — F909 Attention-deficit hyperactivity disorder, unspecified type: Secondary | ICD-10-CM | POA: Diagnosis not present

## 2014-03-07 DIAGNOSIS — Z79899 Other long term (current) drug therapy: Secondary | ICD-10-CM | POA: Insufficient documentation

## 2014-03-07 DIAGNOSIS — H9190 Unspecified hearing loss, unspecified ear: Secondary | ICD-10-CM | POA: Diagnosis not present

## 2014-03-07 DIAGNOSIS — F319 Bipolar disorder, unspecified: Secondary | ICD-10-CM | POA: Insufficient documentation

## 2014-03-07 DIAGNOSIS — Z87891 Personal history of nicotine dependence: Secondary | ICD-10-CM | POA: Diagnosis not present

## 2014-03-07 DIAGNOSIS — G8929 Other chronic pain: Secondary | ICD-10-CM | POA: Diagnosis not present

## 2014-03-07 DIAGNOSIS — F411 Generalized anxiety disorder: Secondary | ICD-10-CM | POA: Diagnosis not present

## 2014-03-07 DIAGNOSIS — K219 Gastro-esophageal reflux disease without esophagitis: Secondary | ICD-10-CM | POA: Diagnosis not present

## 2014-03-07 DIAGNOSIS — Z880 Allergy status to penicillin: Secondary | ICD-10-CM | POA: Diagnosis not present

## 2014-03-07 DIAGNOSIS — R569 Unspecified convulsions: Secondary | ICD-10-CM | POA: Insufficient documentation

## 2014-03-07 DIAGNOSIS — J45909 Unspecified asthma, uncomplicated: Secondary | ICD-10-CM | POA: Insufficient documentation

## 2014-03-07 HISTORY — DX: Unspecified convulsions: R56.9

## 2014-03-07 LAB — BASIC METABOLIC PANEL
Anion gap: 11 (ref 5–15)
BUN: 8 mg/dL (ref 6–23)
CO2: 26 mEq/L (ref 19–32)
Calcium: 9.9 mg/dL (ref 8.4–10.5)
Chloride: 104 mEq/L (ref 96–112)
Creatinine, Ser: 1.02 mg/dL (ref 0.50–1.35)
GFR calc Af Amer: >90 mL/min (ref >90)
GFR calc non Af Amer: >90 mL/min (ref >90)
Glucose, Bld: 80 mg/dL (ref 70–99)
Potassium: 4.1 mEq/L (ref 3.7–5.3)
Sodium: 141 mEq/L (ref 137–147)

## 2014-03-07 LAB — URINALYSIS, ROUTINE W REFLEX MICROSCOPIC
Bilirubin Urine: NEGATIVE
Glucose, UA: NEGATIVE mg/dL
Hgb urine dipstick: NEGATIVE
Ketones, ur: NEGATIVE mg/dL
Leukocytes, UA: NEGATIVE
Nitrite: NEGATIVE
Protein, ur: NEGATIVE mg/dL
Specific Gravity, Urine: 1.02 (ref 1.005–1.030)
Urobilinogen, UA: 0.2 mg/dL (ref 0.0–1.0)
pH: 7 (ref 5.0–8.0)

## 2014-03-07 LAB — CBC WITH DIFFERENTIAL/PLATELET
Basophils Absolute: 0 10*3/uL (ref 0.0–0.1)
Basophils Relative: 0 % (ref 0–1)
Eosinophils Absolute: 0.1 10*3/uL (ref 0.0–0.7)
Eosinophils Relative: 1 % (ref 0–5)
HCT: 41.2 % (ref 39.0–52.0)
Hemoglobin: 14.6 g/dL (ref 13.0–17.0)
Lymphocytes Relative: 18 % (ref 12–46)
Lymphs Abs: 1.3 10*3/uL (ref 0.7–4.0)
MCH: 29.1 pg (ref 26.0–34.0)
MCHC: 35.4 g/dL (ref 30.0–36.0)
MCV: 82.1 fL (ref 78.0–100.0)
Monocytes Absolute: 0.6 10*3/uL (ref 0.1–1.0)
Monocytes Relative: 7 % (ref 3–12)
Neutro Abs: 5.5 10*3/uL (ref 1.7–7.7)
Neutrophils Relative %: 74 % (ref 43–77)
Platelets: 237 10*3/uL (ref 150–400)
RBC: 5.02 MIL/uL (ref 4.22–5.81)
RDW: 12.6 % (ref 11.5–15.5)
WBC: 7.5 10*3/uL (ref 4.0–10.5)

## 2014-03-07 LAB — RAPID URINE DRUG SCREEN, HOSP PERFORMED
Amphetamines: NOT DETECTED
Barbiturates: NOT DETECTED
Benzodiazepines: NOT DETECTED
Cocaine: NOT DETECTED
Opiates: NOT DETECTED
Tetrahydrocannabinol: NOT DETECTED

## 2014-03-07 MED ORDER — LORAZEPAM 2 MG/ML IJ SOLN: 1.0000 mg | Freq: Once | INTRAMUSCULAR | Status: DC

## 2014-03-07 MED ORDER — LORAZEPAM 1 MG PO TABS
2.0000 mg | ORAL_TABLET | Freq: Once | ORAL | Status: AC
  Administered 2014-03-07: 2 mg via ORAL
  Filled 2014-03-07: qty 2

## 2014-03-07 NOTE — Discharge Instructions (Signed)

## 2014-03-07 NOTE — ED Notes (Signed)
Family member noticed that pt started shcking in his sleep. Mom states this seizure was different from his usual in that he normally only has upper body shaking, this time was full body, frothing at mouth and prolonged postictal state. Pt was still unresponsive upon EMS arrival.

## 2014-03-07 NOTE — ED Provider Notes (Signed)
CSN: [id number]     Arrival date & time 03/07/14  0010 History   First MD Initiated Contact with Patient 03/07/14 [phone]     Chief Complaint  Patient presents with  . Seizure      (Consider location/radiation/quality/duration/timing/severity/associated sxs/prior Treatment) HPI Level 5 Caveat: MR This is a 23 year old male with psychiatric disorders on multiple psychiatric medications. He has a history of a seizure disorder and was supposed to be seen by his neurologist yesterday but the appointment was canceled due to computer issues. He was found to be having a witnessed seizure about 11 PM yesterday evening. It was more prolonged than seizures he has had in the past and so EMS was summoned. Reportedly his seizure abated and he had a second seizure witnessed by EMS. This is reportedly lasted about 10 minutes. He was not incontinent. He did not bite his tongue. He was postictally confused afterwards but has returned to his baseline mental status. He is without acute complaint. His family denies any recent illness or changes.  Past Medical History  Diagnosis Date  . Asthma   . ADHD (attention deficit hyperactivity disorder)   . Bipolar 1 disorder   . GERD (gastroesophageal reflux disease)   . Depression   . Anxiety   . Mental developmental delay 11/15/2012  . PONV (postoperative nausea and vomiting)   . Hearing loss   . HOH (hard of hearing)   . Abdominal pain, chronic, right upper quadrant   . Colitis   . Seizures    Past Surgical History  Procedure Laterality Date  . Tonsillectomy    . Adenoidectomy    . Esophagogastroduodenoscopy (egd) with propofol N/A 12/07/2012    SLF:The mucosa of the esophagus appeared normal Non-erosive gastritis (inflammation) was found in the gastric antrum; multiple biopsies The duodenal mucosa showed no abnormalities in the bulb and second portion of the duodenum  . Esophageal biopsy N/A 12/07/2012    Procedure: GASTRIC BIOPSIES;  Surgeon: [name], MD;   Location: [facility];  Service: Endoscopy;  Laterality: N/A;  . Colonoscopy with propofol N/A 08/30/2013    EPP:IRJJOA mucosa in the terminal ileum/COLITIS/ MILD PROCTITIS. 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  . Esophageal biopsy N/A 08/30/2013    Procedure: BIOPSY;  Surgeon: [name], MD;  Location: [facility];  Service: Endoscopy;  Laterality: N/A;  right colon,transverse colon, left colon, rectal biopsies   Family History  Problem Relation Age of Onset  . Asthma Mother   . Ulcers Mother   . Bipolar disorder Mother   . Colon cancer Neg Hx   . Liver disease Neg Hx   . ADD / ADHD Father   . Diabetes Maternal Grandmother   . Diabetes Maternal Grandfather    History  Substance Use Topics  . Smoking status: Former Smoker -- 1.00 packs/day for 2 years    Types: Cigarettes    Quit date: 10/06/2012  . Smokeless tobacco: Never Used  . Alcohol Use: No    Review of Systems  All other systems reviewed and are negative.   Allergies  Amoxicillin-pot clavulanate; Omeprazole; Pineapple; Strawberry; and Tomato  Home Medications   Prior to Admission medications   Medication Sig Start Date End Date Taking? Authorizing Provider  albuterol (PROAIR HFA) 108 (90 BASE) MCG/ACT inhaler Inhale 2 puffs into the lungs every 4 (four) hours as needed for wheezing. Persistent dry cough 09/21/13  Yes  Sandi Mealy, MD  albuterol (PROVENTIL) (2.5 MG/3ML) 0.083% nebulizer solution Take 2.5 mg by nebulization every 6 (six) hours as needed. For shortness of breath   Yes Historical Provider, MD  atomoxetine (STRATTERA) 100 MG capsule Take 100 mg by mouth every morning.   Yes Historical Provider, MD  cloNIDine (CATAPRES) 0.3 MG tablet Take 0.3 mg by mouth at bedtime.    Yes Historical Provider, MD  loratadine (CLARITIN) 10 MG tablet Take 10 mg by mouth at bedtime.   Yes Historical Provider, MD   mesalamine (APRISO) 0.375 G 24 hr capsule Take 4 capsules (1.5 g total) by mouth daily. 09/29/13  Yes [name], MD  methylphenidate (CONCERTA) 54 MG CR tablet Take 54 mg by mouth every morning.   Yes Historical Provider, MD  montelukast (SINGULAIR) 10 MG tablet Take 1 tablet (10 mg total) by mouth at bedtime. 09/21/13  Yes Sandi Mealy, MD  nystatin (MYCOSTATIN) 100000 UNIT/ML suspension Take 5 mLs (500,000 Units total) by mouth 4 (four) times daily. Keep in mouth as long as possible before swallowing. 02/23/14  Yes Orvil Feil, NP  ranitidine (ZANTAC) 150 MG tablet Take 150 mg by mouth daily as needed for heartburn.   Yes Historical Provider, MD  sertraline (ZOLOFT) 50 MG tablet Take 50 mg by mouth daily.   Yes Historical Provider, MD  ziprasidone (GEODON) 80 MG capsule Take 80 mg by mouth at bedtime.   Yes Historical Provider, MD  EPINEPHrine (EPIPEN) 0.3 mg/0.3 mL DEVI Inject 0.3 mLs (0.3 mg total) into the muscle once. 11/15/12   Dalia A Maretta Los, MD   BP 136/87  Pulse 79  Temp(Src) 98.9 F (37.2 C) (Oral)  Resp 20  SpO2 99%  Physical Exam General: Well-developed, well-nourished male in no acute distress; appearance consistent with age of record HENT: normocephalic; atraumatic; no trauma to tongue Eyes: pupils equal, round and reactive to light; extraocular muscles intact Neck: supple Heart: regular rate and rhythm Lungs: clear to auscultation bilaterally Abdomen: soft; nondistended; nontender; bowel sounds present Extremities: No deformity; full range of motion; pulses normal Neurologic: Awake, alert; motor function intact in all extremities and symmetric; no facial droop Skin: Warm and dry Psychiatric: Flat    ED Course  Procedures (including critical care time)  MDM  The patient's family was advised that he is on multiple medications which may lower the seizure threshold or trigger seizures. These include Strattera, Concerta, sertraline and Geodon. The family was  advised to contact his physicians and advised them of his seizure and requests a medication review as well as to help avoid seizures in the future. She was notably advised to contact his neurologist today in light of his seizure and canceled appointment yesterday.    Wynetta Fines, MD 03/07/14 [phone]

## 2014-03-07 NOTE — ED Notes (Addendum)
No further seizure, more alert, moving about on stretcher at will, verbal. Drinking sprite and gave urine spec

## 2014-03-07 NOTE — ED Notes (Signed)
Pt had witnessed seizure at home tonight, is post-ictal on arrival. Per ems, pt was post ictal on ems arrival

## 2014-03-07 NOTE — ED Notes (Signed)
Mom states he has been worked up [employer] and had been diagnosed with "non-epileptic" seizures. Is not on any seizure meds

## 2014-03-08 ENCOUNTER — Ambulatory Visit (INDEPENDENT_AMBULATORY_CARE_PROVIDER_SITE_OTHER): Payer: Medicaid Other | Admitting: Diagnostic Neuroimaging

## 2014-03-08 ENCOUNTER — Encounter: Payer: Self-pay | Admitting: Diagnostic Neuroimaging

## 2014-03-08 VITALS — BP 126/88 | HR 94 | Ht 67.0 in | Wt 180.4 lb

## 2014-03-08 DIAGNOSIS — R569 Unspecified convulsions: Secondary | ICD-10-CM

## 2014-03-08 MED ORDER — DIVALPROEX SODIUM 500 MG PO DR TAB: 500.0000 mg | DELAYED_RELEASE_TABLET | Freq: Two times a day (BID) | ORAL | Status: DC

## 2014-03-08 NOTE — Progress Notes (Signed)
[facility]  PATIENT: Wayne Avila DOB: 12-Jun-1990  REFERRING CLINICIAN:  HISTORY FROM: patient and mother REASON FOR VISIT: follow up    HISTORICAL  CHIEF COMPLAINT:  Chief Complaint  Patient presents with  . Follow-up    HISTORY OF PRESENT ILLNESS:   UPDATE 03/08/14: Since last visit, went to [place] (July 2015), 2-3 days, no events occurred, no epileptiform discharges noted. This past Monday evening, went to sleep around 10pm, then around 11pm had convulsions during sleep. Mother noted eyes rolled back, whole body stiffness and convulsions. EMS called, and patient had second seizure/event. Went to ER. Continued fatigue today.   UPDATE 09/28/13: Since last visit, has had 2 more events. 1 was falling out of chair, with convulsions, and rapid return to consciousness. No triggers. Another event last Sunday after going there for abdominal pain, laying on exam table, brief LOC (1 minute), without convulsions. Mom witnessed, but didn't mention to ER staff.   UPDATE 08/03/13: Since last visit, had MRI and EEG (both normal). No further witnessed convulsions or seizures. On 08/01/13, patient went to sleep in the evening. The only thing he ate that day was 2 egg/cheese biscuits. Around 2am, he work up not feeling well. He was "shaky" and confused. Family check his blood sugar at home (mother and grandmother are diabetic) and his reading was in the 27's. Patient was taken to the emergency room and evaluated. Blood sugar in the emergency room was 120. Since that time patient was continued to be fatigued and sleepy. Patient has been having increased thirst as well as weight loss over the past one year. No official diagnosis of diabetes.  PRIOR HPI (06/29/13): 23 year old male here for evaluation of seizures. February 2014 patient had first episode of possible seizure when he was at home, in the middle of an argument and dramatic event when a girl showed up at his home claiming  that she had no place to go and needed to move in. Apparently she and the patient had gone out on a date one time previously. There was a dramatic scene, and the patient's family had to call the police to have her leave. While waiting for the police, patient began to stare, then lose consciousness, have convulsions for several minutes. He was incontinent of urine. Apparently hhe stopped breathing. His eyes were closed and he had stiffness in his muscles. This was witnessed by patient's mother. Patient was taken by ambulance to [facility] and diagnosed with an apparent stress reaction. After coming home patient was noted to be spitting up blood. He was taken to [facility] next day and diagnosed with possible gastric ulcer. 06/10/13, patient was in a car with his grandmother and girlfriend, when the grandmother noticed that his breathing changed. Apparently his eyes rolled back and he lost consciousness. Patient's mother was inside of the store at this time. Earlier in the day patient had gotten into a very heated argument and was very upset. When patient's mother came back to the car, she called 911. Patient was taken to the hospital for evaluation. Patient was discharged with recommendation for close neurologic followup. In the past patient has never had any other episodes of loss of consciousness. He has had multiple episodes of "staring spells" starting at age 67 years old, continuing throughout his life. Some of these episodes occur when he is upset. Some of these episodes occur without any trigger. They have been occurring at least once per month. Some of  these episodes are able to be terminated by tapping patient on the shoulder. Other episodes cannot be terminated by tapping the patient shoulder. Patient is continuing to have psychiatry/psychology followup at [facility]. Review of prior hospital records indicate history of being bullied at school, prior suicidal thoughts and emotional  trauma.  Birth/developmental history: Patient was born full-term ([redacted] weeks gestational age, 8 pounds weight, normal delivery without complication) with normal development until kindergarten. He talked with a few words starting at 5 or 6 months and started walking around 10 and half months. He had frequent ear infections with subsequent mild hearing loss. In kindergarten he had some difficulty and was held back. During his second try of kindergarten he was then diagnosed with hearing loss and possible ADHD. Age 41 years old he was diagnosed with bipolar disorder. He had some difficulty to school and in sixth grade through 12th grade attended some special education classes as well as traditional classes. He was able to graduate high school with a diploma as well as make the honor roll several times.  REVIEW OF SYSTEMS: Full 14 system review of systems performed and notable only for allergies seizure fatigue excess sweating hearing loss behavior problem depression.   ALLERGIES: Allergies  Allergen Reactions  . Amoxicillin-Pot Clavulanate Nausea And Vomiting  . Omeprazole Nausea And Vomiting  . Pineapple Swelling  . Strawberry Swelling  . Tomato Rash    HOME MEDICATIONS: Outpatient Prescriptions Prior to Visit  Medication Sig Dispense Refill  . albuterol (PROAIR HFA) 108 (90 BASE) MCG/ACT inhaler Inhale 2 puffs into the lungs every 4 (four) hours as needed for wheezing. Persistent dry cough  6.7 g  2  . albuterol (PROVENTIL) (2.5 MG/3ML) 0.083% nebulizer solution Take 2.5 mg by nebulization every 6 (six) hours as needed. For shortness of breath      . atomoxetine (STRATTERA) 100 MG capsule Take 100 mg by mouth every morning.      . cloNIDine (CATAPRES) 0.3 MG tablet Take 0.3 mg by mouth at bedtime.       Marland Kitchen EPINEPHrine (EPIPEN) 0.3 mg/0.3 mL DEVI Inject 0.3 mLs (0.3 mg total) into the muscle once.  1 Device  2  . loratadine (CLARITIN) 10 MG tablet Take 10 mg by mouth at bedtime.      .  mesalamine (APRISO) 0.375 G 24 hr capsule Take 4 capsules (1.5 g total) by mouth daily.  124 capsule  11  . methylphenidate (CONCERTA) 54 MG CR tablet Take 54 mg by mouth every morning.      . montelukast (SINGULAIR) 10 MG tablet Take 1 tablet (10 mg total) by mouth at bedtime.  30 tablet  3  . nystatin (MYCOSTATIN) 100000 UNIT/ML suspension Take 5 mLs (500,000 Units total) by mouth 4 (four) times daily. Keep in mouth as long as possible before swallowing.  480 mL  1  . ranitidine (ZANTAC) 150 MG tablet Take 150 mg by mouth daily as needed for heartburn.      . sertraline (ZOLOFT) 50 MG tablet Take 50 mg by mouth daily.      . ziprasidone (GEODON) 80 MG capsule Take 80 mg by mouth at bedtime.       No facility-administered medications prior to visit.    PAST MEDICAL HISTORY: Past Medical History  Diagnosis Date  . Asthma   . ADHD (attention deficit hyperactivity disorder)   . Bipolar 1 disorder   . GERD (gastroesophageal reflux disease)   . Depression   . Anxiety   .  Mental developmental delay 11/15/2012  . PONV (postoperative nausea and vomiting)   . Hearing loss   . HOH (hard of hearing)   . Abdominal pain, chronic, right upper quadrant   . Colitis   . Seizures     PAST SURGICAL HISTORY: Past Surgical History  Procedure Laterality Date  . Tonsillectomy    . Adenoidectomy    . Esophagogastroduodenoscopy (egd) with propofol N/A 12/07/2012    SLF:The mucosa of the esophagus appeared normal Non-erosive gastritis (inflammation) was found in the gastric antrum; multiple biopsies The duodenal mucosa showed no abnormalities in the bulb and second portion of the duodenum  . Esophageal biopsy N/A 12/07/2012    Procedure: GASTRIC BIOPSIES;  Surgeon: [name], MD;  Location: [facility];  Service: Endoscopy;  Laterality: N/A;  . Colonoscopy with propofol N/A 08/30/2013    ASN:KNLZJQ mucosa in the terminal ileum/COLITIS/ MILD PROCTITIS. 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  . Esophageal biopsy N/A 08/30/2013    Procedure: BIOPSY;  Surgeon: [name], MD;  Location: [facility];  Service: Endoscopy;  Laterality: N/A;  right colon,transverse colon, left colon, rectal biopsies    FAMILY HISTORY: Family History  Problem Relation Age of Onset  . Asthma Mother   . Ulcers Mother   . Bipolar disorder Mother   . Colon cancer Neg Hx   . Liver disease Neg Hx   . ADD / ADHD Father   . Diabetes Maternal Grandmother   . Diabetes Maternal Grandfather     SOCIAL HISTORY:  History   Social History  . Marital Status: Single    Spouse Name: N/A    Number of Children: 0  . Years of Education: 12th   Occupational History  .      not working   Social History Main Topics  . Smoking status: Former Smoker -- 1.00 packs/day for 2 years    Types: Cigarettes    Quit date: 10/06/2012  . Smokeless tobacco: Never Used  . Alcohol Use: No  . Drug Use: No  . Sexual Activity: Yes   Other Topics Concern  . Not on file   Social History Narrative   Patient lives at home with girlfriend and family.   Caffeine Use: occasionally     PHYSICAL EXAM  Filed Vitals:   03/08/14 1301  BP: 126/88  Pulse: 94  Height: 5' 7"  (1.702 m)  Weight: 180 lb 6.4 oz (81.829 kg)    Not recorded    Body mass index is 28.25 kg/(m^2).   GENERAL EXAM: Patient is in no distress; SLEEPY, PSYCHOMOTOR SLOWING, SOFT VOICE, QUIET RESPONSES, POOR EYE CONTACT.   CARDIOVASCULAR: Regular rate and rhythm, no murmurs, no carotid bruits  NEUROLOGIC: MENTAL STATUS: awake, alert, language fluent, comprehension intact, naming intact, fund of knowledge appropriate CRANIAL NERVE: no papilledema on fundoscopic exam, pupils equal and reactive to light, visual fields full to confrontation, extraocular muscles intact, no nystagmus, facial sensation and strength symmetric, hearing intact, palate elevates  symmetrically, uvula midline, shoulder shrug symmetric, tongue midline. MOTOR: normal bulk and tone, full strength in the BUE, BLE SENSORY: normal and symmetric to light touch, temperature, vibration COORDINATION: finger-nose-finger, fine finger movements normal REFLEXES: deep tendon reflexes present and symmetric GAIT/STATION: narrow based gait; able to walk on toes, heels and tandem; romberg is negative   DIAGNOSTIC DATA (LABS, IMAGING, TESTING) - I reviewed patient records, labs, notes, testing and  imaging myself where available.  Lab Results  Component Value Date   WBC 7.5 03/07/2014   HGB 14.6 03/07/2014   HCT 41.2 03/07/2014   MCV 82.1 03/07/2014   PLT 237 03/07/2014      Component Value Date/Time   NA 141 03/07/2014 0019   K 4.1 03/07/2014 0019   CL 104 03/07/2014 0019   CO2 26 03/07/2014 0019   GLUCOSE 80 03/07/2014 0019   BUN 8 03/07/2014 0019   CREATININE 1.02 03/07/2014 0019   CALCIUM 9.9 03/07/2014 0019   PROT 7.5 01/03/2014 0508   ALBUMIN 4.4 01/03/2014 0508   AST 18 01/03/2014 0508   ALT 11 01/03/2014 0508   ALKPHOS 57 01/03/2014 0508   BILITOT 0.4 01/03/2014 0508   GFRNONAA >90 03/07/2014 0019   GFRAA >90 03/07/2014 0019   No results found for this basename: CHOL,  HDL,  LDLCALC,  LDLDIRECT,  TRIG,  CHOLHDL   Lab Results  Component Value Date   HGBA1C 5.7* 08/03/2013   No results found for this basename: VITAMINB12   Lab Results  Component Value Date   TSH 1.996 08/09/2013    I reviewed images myself and agree with interpretation.   10/20/10 CT head - normal   07/14/13 MRI brain - normal  07/21/13 EEG - normal   ASSESSMENT AND PLAN  23 y.o. year old male here with possible seizure disorder vs pseudo-seizures. MRI, EEG and VEEG unremarkable. Recent event is more suspcious for epileptic seizure. Prior events may be mixture of epileptic and non-epileptic spells.  PLAN: - trial of divalproex 584m BID (may help with bipolar and seizure disorder - no driving or  operating heavy machinery (pt does not have driver's license); caution with heights, swimming and other seizure precautions  Return in about 3 months (around 06/07/2014).   VPenni Bombard MD 91/36/4383 17:79PM Certified in Neurology, Neurophysiology and Neuroimaging  [facility] [address] [place] [phone]

## 2014-03-12 ENCOUNTER — Emergency Department (HOSPITAL_COMMUNITY)
Admission: EM | Admit: 2014-03-12 | Discharge: 2014-03-13 | Disposition: A | Discharge status: Home / Self Care | Payer: Medicaid Other | Attending: Emergency Medicine | Admitting: Emergency Medicine

## 2014-03-12 ENCOUNTER — Encounter (HOSPITAL_COMMUNITY): Payer: Self-pay | Admitting: Emergency Medicine

## 2014-03-12 DIAGNOSIS — F419 Anxiety disorder, unspecified: Secondary | ICD-10-CM | POA: Insufficient documentation

## 2014-03-12 DIAGNOSIS — K219 Gastro-esophageal reflux disease without esophagitis: Secondary | ICD-10-CM | POA: Diagnosis not present

## 2014-03-12 DIAGNOSIS — G8929 Other chronic pain: Secondary | ICD-10-CM | POA: Insufficient documentation

## 2014-03-12 DIAGNOSIS — Z880 Allergy status to penicillin: Secondary | ICD-10-CM | POA: Insufficient documentation

## 2014-03-12 DIAGNOSIS — H9190 Unspecified hearing loss, unspecified ear: Secondary | ICD-10-CM | POA: Diagnosis not present

## 2014-03-12 DIAGNOSIS — Z9889 Other specified postprocedural states: Secondary | ICD-10-CM | POA: Diagnosis not present

## 2014-03-12 DIAGNOSIS — F319 Bipolar disorder, unspecified: Secondary | ICD-10-CM | POA: Diagnosis not present

## 2014-03-12 DIAGNOSIS — J45909 Unspecified asthma, uncomplicated: Secondary | ICD-10-CM | POA: Insufficient documentation

## 2014-03-12 DIAGNOSIS — Z9089 Acquired absence of other organs: Secondary | ICD-10-CM | POA: Insufficient documentation

## 2014-03-12 DIAGNOSIS — Z87891 Personal history of nicotine dependence: Secondary | ICD-10-CM | POA: Diagnosis not present

## 2014-03-12 DIAGNOSIS — Z79899 Other long term (current) drug therapy: Secondary | ICD-10-CM | POA: Diagnosis not present

## 2014-03-12 DIAGNOSIS — F819 Developmental disorder of scholastic skills, unspecified: Secondary | ICD-10-CM | POA: Diagnosis not present

## 2014-03-12 DIAGNOSIS — F909 Attention-deficit hyperactivity disorder, unspecified type: Secondary | ICD-10-CM | POA: Diagnosis not present

## 2014-03-12 DIAGNOSIS — R109 Unspecified abdominal pain: Secondary | ICD-10-CM | POA: Diagnosis present

## 2014-03-12 DIAGNOSIS — K51911 Ulcerative colitis, unspecified with rectal bleeding: Secondary | ICD-10-CM | POA: Diagnosis not present

## 2014-03-12 MED ORDER — ONDANSETRON HCL 4 MG/2ML IJ SOLN
4.0000 mg | Freq: Once | INTRAMUSCULAR | Status: AC
  Administered 2014-03-12: 4 mg via INTRAVENOUS

## 2014-03-12 MED ORDER — METHYLPREDNISOLONE SODIUM SUCC 125 MG IJ SOLR
125.0000 mg | Freq: Once | INTRAMUSCULAR | Status: AC
Start: 2014-03-12 — End: 2014-03-12
  Administered 2014-03-12: 125 mg via INTRAVENOUS
  Filled 2014-03-12: qty 2

## 2014-03-12 MED ORDER — FENTANYL CITRATE 0.05 MG/ML IJ SOLN
100.0000 ug | Freq: Once | INTRAMUSCULAR | Status: AC
  Administered 2014-03-12: 100 ug via INTRAVENOUS
  Filled 2014-03-12 (×2): qty 2

## 2014-03-12 MED ORDER — SODIUM CHLORIDE 0.9 % IV BOLUS (SEPSIS)
1000.0000 mL | Freq: Once | INTRAVENOUS | Status: AC
  Administered 2014-03-12: 1000 mL via INTRAVENOUS

## 2014-03-12 MED ORDER — ONDANSETRON HCL 4 MG/2ML IJ SOLN
4.0000 mg | Freq: Once | INTRAMUSCULAR | Status: DC
  Filled 2014-03-12: qty 2

## 2014-03-12 NOTE — ED Provider Notes (Signed)
CSN: [id number]     Arrival date & time 03/12/14  2302 History  This chart was scribed for Wynetta Fines, MD by Molli Posey, ED Scribe. This patient was seen in room APA09/APA09 and the patient's care was started 11:33 PM.    Chief Complaint  Patient presents with  . Abdominal Pain    LEVEL 5 CAVEAT   The history is provided by the patient and a parent. No language interpreter was used.   HPI Comments: Wayne Avila is a 23 y.o. male with a history of colitis who presents to the Emergency Department complaining of vomiting that started 2 days ago. Patient reports that he has vomited 4 times but is not currently nauseated. He states he has associated right-sided pain. He reports associated blood in his stool. His mother states that his Apriso medication had been working well and that he has been taking his medications regularly. He denies diarrhea. He has had decreased oral intake over the past 2-3 days.    Past Medical History  Diagnosis Date  . Asthma   . ADHD (attention deficit hyperactivity disorder)   . Bipolar 1 disorder   . GERD (gastroesophageal reflux disease)   . Depression   . Anxiety   . Mental developmental delay 11/15/2012  . PONV (postoperative nausea and vomiting)   . Hearing loss   . HOH (hard of hearing)   . Abdominal pain, chronic, right upper quadrant   . Colitis   . Seizures    Past Surgical History  Procedure Laterality Date  . Tonsillectomy    . Adenoidectomy    . Esophagogastroduodenoscopy (egd) with propofol N/A 12/07/2012    SLF:The mucosa of the esophagus appeared normal Non-erosive gastritis (inflammation) was found in the gastric antrum; multiple biopsies The duodenal mucosa showed no abnormalities in the bulb and second portion of the duodenum  . Esophageal biopsy N/A 12/07/2012    Procedure: GASTRIC BIOPSIES;  Surgeon: [name], MD;  Location: [facility];  Service: Endoscopy;  Laterality: N/A;  . Colonoscopy with propofol N/A 08/30/2013     SFK:CLEXNT mucosa in the terminal ileum/COLITIS/ MILD PROCTITIS. 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  . Esophageal biopsy N/A 08/30/2013    Procedure: BIOPSY;  Surgeon: [name], MD;  Location: [facility];  Service: Endoscopy;  Laterality: N/A;  right colon,transverse colon, left colon, rectal biopsies   Family History  Problem Relation Age of Onset  . Asthma Mother   . Ulcers Mother   . Bipolar disorder Mother   . Colon cancer Neg Hx   . Liver disease Neg Hx   . ADD / ADHD Father   . Diabetes Maternal Grandmother   . Diabetes Maternal Grandfather    History  Substance Use Topics  . Smoking status: Former Smoker -- 1.00 packs/day for 2 years    Types: Cigarettes    Quit date: 10/06/2012  . Smokeless tobacco: Never Used  . Alcohol Use: No    Review of Systems  Constitutional: Negative for fever.  Gastrointestinal: Positive for nausea, vomiting and abdominal pain. Negative for diarrhea.  All other systems reviewed and are negative.     Allergies  Amoxicillin-pot clavulanate; Omeprazole; Pineapple; Strawberry; and Tomato  Home Medications   Prior to Admission medications   Medication Sig Start Date End Date Taking? Authorizing Provider  albuterol (PROAIR HFA) 108 (90 BASE) MCG/ACT inhaler Inhale 2 puffs into  the lungs every 4 (four) hours as needed for wheezing. Persistent dry cough 09/21/13   Sandi Mealy, MD  albuterol (PROVENTIL) (2.5 MG/3ML) 0.083% nebulizer solution Take 2.5 mg by nebulization every 6 (six) hours as needed. For shortness of breath    Historical Provider, MD  atomoxetine (STRATTERA) 100 MG capsule Take 100 mg by mouth every morning.    Historical Provider, MD  cloNIDine (CATAPRES) 0.3 MG tablet Take 0.3 mg by mouth at bedtime.     Historical Provider, MD  divalproex (DEPAKOTE) 500 MG DR tablet Take 1 tablet (500 mg total) by mouth 2  (two) times daily. 03/08/14   Penni Bombard, MD  EPINEPHrine (EPIPEN) 0.3 mg/0.3 mL DEVI Inject 0.3 mLs (0.3 mg total) into the muscle once. 11/15/12   Garvin Fila, MD  loratadine (CLARITIN) 10 MG tablet Take 10 mg by mouth at bedtime.    Historical Provider, MD  mesalamine (APRISO) 0.375 G 24 hr capsule Take 4 capsules (1.5 g total) by mouth daily. 09/29/13   [name], MD  methylphenidate (CONCERTA) 54 MG CR tablet Take 54 mg by mouth every morning.    Historical Provider, MD  montelukast (SINGULAIR) 10 MG tablet Take 1 tablet (10 mg total) by mouth at bedtime. 09/21/13   Sandi Mealy, MD  nystatin (MYCOSTATIN) 100000 UNIT/ML suspension Take 5 mLs (500,000 Units total) by mouth 4 (four) times daily. Keep in mouth as long as possible before swallowing. 02/23/14   Orvil Feil, NP  ranitidine (ZANTAC) 150 MG tablet Take 150 mg by mouth daily as needed for heartburn.    Historical Provider, MD  sertraline (ZOLOFT) 50 MG tablet Take 50 mg by mouth daily.    Historical Provider, MD  ziprasidone (GEODON) 80 MG capsule Take 80 mg by mouth at bedtime.    Historical Provider, MD   BP 146/87  Temp(Src) 99 F (37.2 C) (Oral)  Resp 18  Ht 5' 7"  (1.702 m)  Wt 180 lb (81.647 kg)  BMI 28.19 kg/m2  SpO2 100% Physical Exam  Nursing note and vitals reviewed. Constitutional: He is oriented to person, place, and time. He appears well-developed and well-nourished.  HENT:  Head: Normocephalic and atraumatic.  Mucous membranes moist   Cardiovascular: Normal rate, regular rhythm and normal heart sounds.   Pulmonary/Chest: Effort normal and breath sounds normal.  Abdominal: Soft. Bowel sounds are normal. He exhibits no distension. There is tenderness.  Right sided abdominal tenderness   Musculoskeletal: Normal range of motion.  Neurological: He is alert and oriented to person, place, and time.  Skin: Skin is warm and dry.  Psychiatric: He has a normal mood and affect.    ED Course   Procedures  DIAGNOSTIC STUDIES: Oxygen Saturation is 100% on RA, normal by my interpretation.    COORDINATION OF CARE: 12:53 AM Discussed treatment plan with pt at bedside and pt agreed to plan.   MDM   Results for orders placed during the hospital encounter of 03/12/14  CBC WITH DIFFERENTIAL      Result Value Ref Range   WBC 7.2  4.0 - 10.5 K/uL   RBC 5.20  4.22 - 5.81 MIL/uL   Hemoglobin 15.0  13.0 - 17.0 g/dL   HCT 42.5  39.0 - 52.0 %   MCV 81.7  78.0 - 100.0 fL   MCH 28.8  26.0 - 34.0 pg   MCHC 35.3  30.0 - 36.0 g/dL   RDW 12.7  11.5 - 15.5 %  Platelets 241  150 - 400 K/uL   Neutrophils Relative % 68  43 - 77 %   Neutro Abs 4.9  1.7 - 7.7 K/uL   Lymphocytes Relative 23  12 - 46 %   Lymphs Abs 1.7  0.7 - 4.0 K/uL   Monocytes Relative 7  3 - 12 %   Monocytes Absolute 0.5  0.1 - 1.0 K/uL   Eosinophils Relative 2  0 - 5 %   Eosinophils Absolute 0.1  0.0 - 0.7 K/uL   Basophils Relative 0  0 - 1 %   Basophils Absolute 0.0  0.0 - 0.1 K/uL  COMPREHENSIVE METABOLIC PANEL      Result Value Ref Range   Sodium 141  137 - 147 mEq/L   Potassium 3.9  3.7 - 5.3 mEq/L   Chloride 104  96 - 112 mEq/L   CO2 25  19 - 32 mEq/L   Glucose, Bld 90  70 - 99 mg/dL   BUN 8  6 - 23 mg/dL   Creatinine, Ser 0.93  0.50 - 1.35 mg/dL   Calcium 9.8  8.4 - 10.5 mg/dL   Total Protein 7.3  6.0 - 8.3 g/dL   Albumin 3.9  3.5 - 5.2 g/dL   AST 15  0 - 37 U/L   ALT 10  0 - 53 U/L   Alkaline Phosphatase 63  39 - 117 U/L   Total Bilirubin 0.3  0.3 - 1.2 mg/dL   GFR calc non Af Amer >90  >90 mL/min   GFR calc Af Amer >90  >90 mL/min   Anion gap 12  5 - 15  LIPASE, BLOOD      Result Value Ref Range   Lipase 24  11 - 59 U/L   12:57 AM Doing better after IV fluids and medications. Will start on a steroid taper and treat his pain and nausea. We will have him followup with his gastroenterologist.  I personally performed the services described in this documentation, which was scribed in my presence. The  recorded information has been reviewed and is accurate.   Wynetta Fines, MD 03/13/14 [phone]

## 2014-03-12 NOTE — ED Notes (Signed)
Pt c/o n/v with right sided abd pain x 3 days; pt states he has blood in his stool

## 2014-03-13 LAB — CBC WITH DIFFERENTIAL/PLATELET
Basophils Absolute: 0 10*3/uL (ref 0.0–0.1)
Basophils Relative: 0 % (ref 0–1)
Eosinophils Absolute: 0.1 10*3/uL (ref 0.0–0.7)
Eosinophils Relative: 2 % (ref 0–5)
HCT: 42.5 % (ref 39.0–52.0)
Hemoglobin: 15 g/dL (ref 13.0–17.0)
Lymphocytes Relative: 23 % (ref 12–46)
Lymphs Abs: 1.7 10*3/uL (ref 0.7–4.0)
MCH: 28.8 pg (ref 26.0–34.0)
MCHC: 35.3 g/dL (ref 30.0–36.0)
MCV: 81.7 fL (ref 78.0–100.0)
Monocytes Absolute: 0.5 10*3/uL (ref 0.1–1.0)
Monocytes Relative: 7 % (ref 3–12)
Neutro Abs: 4.9 10*3/uL (ref 1.7–7.7)
Neutrophils Relative %: 68 % (ref 43–77)
Platelets: 241 10*3/uL (ref 150–400)
RBC: 5.2 MIL/uL (ref 4.22–5.81)
RDW: 12.7 % (ref 11.5–15.5)
WBC: 7.2 10*3/uL (ref 4.0–10.5)

## 2014-03-13 LAB — COMPREHENSIVE METABOLIC PANEL
ALT: 10 U/L (ref 0–53)
AST: 15 U/L (ref 0–37)
Albumin: 3.9 g/dL (ref 3.5–5.2)
Alkaline Phosphatase: 63 U/L (ref 39–117)
Anion gap: 12 (ref 5–15)
BUN: 8 mg/dL (ref 6–23)
CO2: 25 mEq/L (ref 19–32)
Calcium: 9.8 mg/dL (ref 8.4–10.5)
Chloride: 104 mEq/L (ref 96–112)
Creatinine, Ser: 0.93 mg/dL (ref 0.50–1.35)
GFR calc Af Amer: >90 mL/min (ref >90)
GFR calc non Af Amer: >90 mL/min (ref >90)
Glucose, Bld: 90 mg/dL (ref 70–99)
Potassium: 3.9 mEq/L (ref 3.7–5.3)
Sodium: 141 mEq/L (ref 137–147)
Total Bilirubin: 0.3 mg/dL (ref 0.3–1.2)
Total Protein: 7.3 g/dL (ref 6.0–8.3)

## 2014-03-13 LAB — LIPASE, BLOOD: Lipase: 24 U/L (ref 11–59)

## 2014-03-13 MED ORDER — OXYCODONE-ACETAMINOPHEN 5-325 MG PO TABS: 1.0000 | ORAL_TABLET | ORAL | Status: DC | PRN

## 2014-03-13 MED ORDER — ONDANSETRON 4 MG PO TBDP: 4.0000 mg | ORAL_TABLET | Freq: Three times a day (TID) | ORAL | Status: DC | PRN

## 2014-03-13 MED ORDER — METHYLPREDNISOLONE 4 MG PO KIT: PACK | ORAL | Status: DC

## 2014-03-13 NOTE — ED Notes (Signed)
Discharge instructions given, pt mother demonstrated teach back and verbal understanding. Prescriptions reviewed. No concerns voiced.

## 2014-03-16 ENCOUNTER — Telehealth: Payer: Self-pay | Admitting: Diagnostic Neuroimaging

## 2014-03-16 NOTE — Telephone Encounter (Signed)
Patient's mom calling to state that patient is having an MRI on Tuesday and needs a sedative to calm him down, patient will be here tomorrow for an EEG, please return call and advise.

## 2014-03-16 NOTE — Telephone Encounter (Signed)
Spoke to mother. Advised to make request to pick up med tomorrow while in office. Mother agreed.

## 2014-03-17 ENCOUNTER — Ambulatory Visit (INDEPENDENT_AMBULATORY_CARE_PROVIDER_SITE_OTHER): Payer: Medicaid Other | Admitting: Radiology

## 2014-03-17 DIAGNOSIS — R569 Unspecified convulsions: Secondary | ICD-10-CM

## 2014-03-21 ENCOUNTER — Inpatient Hospital Stay: Admission: RE | Admit: 2014-03-21 | Payer: Medicaid Other | Source: Ambulatory Visit

## 2014-03-30 NOTE — Procedures (Signed)
   [facility]  EEG (ELECTROENCEPHALOGRAM) REPORT   STUDY DATE: 03/30/14  PATIENT NAME: Wayne Avila DOB: [birth year] MRN: [id number]  ORDERING CLINICIAN: Andrey Spearman, MD   TECHNOLOGIST: Towana Badger  TECHNIQUE: Electroencephalogram was recorded utilizing standard 10-20 system of lead placement and reformatted into average and bipolar montages.  RECORDING TIME: 30 minutes  ACTIVATION: photic stimulation  CLINICAL INFORMATION: 23 year old male with seizure vs pseudo-seizure.  FINDINGS: Background rhythms of 9-10 hertz and 30-40 microvolts. Moderate muscle artifact. No focal, lateralizing, epileptiform activity or seizures are seen. Patient recorded in the awake state. EKG channel shows 96 beats per minute.  IMPRESSION:  Normal EEG in the awake state.   INTERPRETING PHYSICIAN:  Penni Bombard, MD Certified in Neurology, Neurophysiology and Neuroimaging  [facility] [address] [place], [place] [address] [phone]

## 2014-03-31 ENCOUNTER — Ambulatory Visit (INDEPENDENT_AMBULATORY_CARE_PROVIDER_SITE_OTHER): Payer: Medicaid Other | Admitting: Gastroenterology

## 2014-03-31 VITALS — BP 124/82 | HR 87 | Temp 98.8°F | Ht 67.0 in | Wt 194.6 lb

## 2014-03-31 DIAGNOSIS — K51911 Ulcerative colitis, unspecified with rectal bleeding: Secondary | ICD-10-CM

## 2014-03-31 NOTE — Assessment & Plan Note (Signed)
23 year old male with likely UC, diagnosed earlier this year. ED presentation due to acute episode of N/V and rectal bleeding but now with complete resolution of symptoms. Improvement with short course of Prednisone and taper. Now without any N/V or further rectal bleeding. No diarrhea.   Would recommend continuing Apriso as directed. I have asked patient and his mother to contact if any further rectal bleeding and we will add Rowasa enemas for course of topical therapy. In interim, no further changes. Return in 3 months.

## 2014-03-31 NOTE — Patient Instructions (Addendum)
Please call me if you have any further evidence of rectal bleeding.   For constipation: you may take 1 capful of Miralax each evening as needed.   Continue Apriso 4 capsules daily.   Return in 3 months to see Dr. [name].

## 2014-03-31 NOTE — Progress Notes (Signed)
Referring Provider: Pa, [facility] Primary Care Physician:  [facility] PA  Chief Complaint  Patient presents with  . Follow-up    recently seen in the ED for colitis/ doing better    HPI:   Wayne Avila presents today with a history of UC. 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 Biopsies could not delineate between Crohn's and ulcerative colitis. [facility] lab test ordered and c/w UC per Dr. Nona Dell note. Prednisone taper provided in June 2015 due to flare. Unable to afford Uceris.   Seen in ED Mar 13, 2014, with 2 day history of N/V, abdominal pain, and blood in stool. DENIED DIARRHEA. Poor historian. Mom states intermittent rectal bleeding in the past but now resolved with Prednisone taper recently prescribed.   Denies any further N/V. No sick contacts. No diarrhea. Last rectal bleeding a few weeks ago. Actually with good appetite and no abdominal pain. Weight up since last visit. No BM in 3 days. Confirms taking Apriso as directed.     Past Medical History  Diagnosis Date  . Asthma   . ADHD (attention deficit hyperactivity disorder)   . Bipolar 1 disorder   . GERD (gastroesophageal reflux disease)   . Depression   . Anxiety   . Mental developmental delay 11/15/2012  . PONV (postoperative nausea and vomiting)   . Hearing loss   . HOH (hard of hearing)   . Abdominal pain, chronic, right upper quadrant   . Colitis   . Seizures     Past Surgical History  Procedure Laterality Date  . Tonsillectomy    . Adenoidectomy    . Esophagogastroduodenoscopy (egd) with propofol N/A 12/07/2012    SLF:The mucosa of the esophagus appeared normal Non-erosive gastritis (inflammation) was found in the gastric antrum; multiple biopsies The duodenal mucosa showed no abnormalities in the bulb and second portion of the duodenum  . Esophageal biopsy N/A 12/07/2012    Procedure: GASTRIC BIOPSIES;  Surgeon: [name], MD;  Location:  [facility];  Service: Endoscopy;  Laterality: N/A;  . Colonoscopy with propofol N/A 08/30/2013    ZDG:UYQIHK mucosa in the terminal ileum/COLITIS/ MILD PROCTITIS. 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  . Esophageal biopsy N/A 08/30/2013    Procedure: BIOPSY;  Surgeon: [name], MD;  Location: [facility];  Service: Endoscopy;  Laterality: N/A;  right colon,transverse colon, left colon, rectal biopsies    Current Outpatient Prescriptions  Medication Sig Dispense Refill  . albuterol (PROAIR HFA) 108 (90 BASE) MCG/ACT inhaler Inhale 2 puffs into the lungs every 4 (four) hours as needed for wheezing. Persistent dry cough  6.7 g  2  . albuterol (PROVENTIL) (2.5 MG/3ML) 0.083% nebulizer solution Take 2.5 mg by nebulization every 6 (six) hours as needed. For shortness of breath      . atomoxetine (STRATTERA) 100 MG capsule Take 100 mg by mouth every morning.      . cloNIDine (CATAPRES) 0.3 MG tablet Take 0.2 mg by mouth at bedtime.       . divalproex (DEPAKOTE) 500 MG DR tablet Take 1 tablet (500 mg total) by mouth 2 (two) times daily.  60 tablet  12  . loratadine (CLARITIN) 10 MG tablet Take 10 mg by mouth at bedtime.      . mesalamine (APRISO) 0.375 G 24 hr capsule Take 4 capsules (1.5 g total) by mouth daily.  124 capsule  11  . methylphenidate (CONCERTA) 54 MG CR tablet Take 54 mg by mouth every morning.      . montelukast (SINGULAIR) 10 MG tablet Take 1 tablet (10 mg total) by mouth at bedtime.  30 tablet  3  . ondansetron (ZOFRAN ODT) 4 MG disintegrating tablet Take 1 tablet (4 mg total) by mouth every 8 (eight) hours as needed for nausea or vomiting.  10 tablet  0  . oxyCODONE-acetaminophen (ROXICET) 5-325 MG per tablet Take 1 tablet by mouth every 4 (four) hours as needed for severe pain (for pain).  10 tablet  0  . ranitidine (ZANTAC) 150 MG tablet Take 150 mg by mouth daily.       .  sertraline (ZOLOFT) 50 MG tablet Take 100 mg by mouth daily.        No current facility-administered medications for this visit.    Allergies as of 03/31/2014 - Review Complete 03/31/2014  Allergen Reaction Noted  . Amoxicillin-pot clavulanate Nausea And Vomiting 02/28/2011  . Omeprazole Nausea And Vomiting 04/20/2013  . Pineapple Swelling 04/14/2011  . Strawberry Swelling 05/08/2012  . Tomato Rash 04/14/2011    Family History  Problem Relation Age of Onset  . Asthma Mother   . Ulcers Mother   . Bipolar disorder Mother   . Colon cancer Neg Hx   . Liver disease Neg Hx   . ADD / ADHD Father   . Diabetes Maternal Grandmother   . Diabetes Maternal Grandfather     History   Social History  . Marital Status: Single    Spouse Name: N/A    Number of Children: 0  . Years of Education: 12th   Occupational History  .      not working   Social History Main Topics  . Smoking status: Former Smoker -- 1.00 packs/day for 2 years    Types: Cigarettes    Quit date: 10/06/2012  . Smokeless tobacco: Never Used  . Alcohol Use: No  . Drug Use: No  . Sexual Activity: Yes   Other Topics Concern  . Not on file   Social History Narrative   Patient lives at home with girlfriend and family.   Caffeine Use: occasionally    Review of Systems: As mentioned in HPI.   Physical Exam: BP 124/82  Pulse 87  Temp(Src) 98.8 F (37.1 C) (Oral)  Ht 5' 7"  (1.702 m)  Wt 194 lb 9.6 oz (88.27 kg)  BMI 30.47 kg/m2 General:   Alert and oriented. Laying face down on exam table. Does not answer questions. Mom speaks for patient.  Head:  Normocephalic and atraumatic. Abdomen:  +BS, soft, non-tender and non-distended. No rebound or guarding. No HSM or masses noted. Msk:  Symmetrical without gross deformities. Normal posture. Extremities:  Without edema. Neurologic:  Alert and  oriented x4;  grossly normal neurologically. Skin:  Intact without significant lesions or rashes. Psych:  Flat affect.     Lab Results  Component Value Date   WBC 7.2 03/12/2014   HGB 15.0 03/12/2014   HCT 42.5 03/12/2014   MCV 81.7 03/12/2014   PLT 241 03/12/2014   Lab Results  Component Value Date   CREATININE 0.93 03/12/2014   BUN 8 03/12/2014   NA 141 03/12/2014   K 3.9 03/12/2014   CL 104 03/12/2014   CO2 25 03/12/2014   Lab Results  Component Value Date   ALT 10 03/12/2014   AST 15 03/12/2014   ALKPHOS 63 03/12/2014  BILITOT 0.3 03/12/2014

## 2014-04-03 ENCOUNTER — Ambulatory Visit
Admission: RE | Admit: 2014-04-03 | Discharge: 2014-04-03 | Disposition: A | Discharge status: Home / Self Care | Payer: Medicaid Other | Source: Ambulatory Visit | Attending: Diagnostic Neuroimaging | Admitting: Diagnostic Neuroimaging

## 2014-04-03 DIAGNOSIS — R569 Unspecified convulsions: Secondary | ICD-10-CM

## 2014-04-03 MED ORDER — GADOBENATE DIMEGLUMINE 529 MG/ML IV SOLN
18.0000 mL | Freq: Once | INTRAVENOUS | Status: AC | PRN
  Administered 2014-04-03: 18 mL via INTRAVENOUS

## 2014-04-04 NOTE — Progress Notes (Signed)
cc'ed to pcp °

## 2014-05-29 ENCOUNTER — Encounter: Payer: Self-pay | Admitting: Gastroenterology

## 2014-06-07 ENCOUNTER — Ambulatory Visit (INDEPENDENT_AMBULATORY_CARE_PROVIDER_SITE_OTHER): Payer: Medicaid Other | Admitting: Diagnostic Neuroimaging

## 2014-06-07 ENCOUNTER — Encounter: Payer: Self-pay | Admitting: Diagnostic Neuroimaging

## 2014-06-07 VITALS — BP 148/94 | HR 110 | Ht 67.0 in | Wt 199.0 lb

## 2014-06-07 DIAGNOSIS — R569 Unspecified convulsions: Secondary | ICD-10-CM

## 2014-06-07 NOTE — Progress Notes (Signed)
[facility]  PATIENT: Wayne Avila DOB: [birth year]  REFERRING CLINICIAN:  HISTORY FROM: patient and mother REASON FOR VISIT: follow up    HISTORICAL  CHIEF COMPLAINT:  Chief Complaint  Patient presents with  . Follow-up    other convulsions    HISTORY OF PRESENT ILLNESS:   UPDATE 06/07/14: Patient very upset today, was on the phone during intake by clinical assistant, swearing. By the time I came in the room, he is sitting on exam table, fuming mad, eyes closed, rubbing hands in a fist making motion, gritting teeth, visibly upset and angry. He does not answer my questions or make eye contact. Mother is sitting in chair answering questions. His mood apparently had been better with depakote. He has had 2 more "seizure" events in last 2 weeks, out of sleep.   UPDATE 03/08/14: Since last visit, went to [place] (July 2015), 2-3 days, no events occurred, no epileptiform discharges noted. This past Monday evening, went to sleep around 10pm, then around 11pm had convulsions during sleep. Mother noted eyes rolled back, whole body stiffness and convulsions. EMS called, and patient had second seizure/event. Went to ER. Continued fatigue today.   UPDATE 09/28/13: Since last visit, has had 2 more events. 1 was falling out of chair, with convulsions, and rapid return to consciousness. No triggers. Another event last Sunday after going there for abdominal pain, laying on exam table, brief LOC (1 minute), without convulsions. Mom witnessed, but didn't mention to ER staff.   UPDATE 08/03/13: Since last visit, had MRI and EEG (both normal). No further witnessed convulsions or seizures. On 08/01/13, patient went to sleep in the evening. The only thing he ate that day was 2 egg/cheese biscuits. Around 2am, he work up not feeling well. He was "shaky" and confused. Family check his blood sugar at home (mother and grandmother are diabetic) and his reading was in the 31's. Patient was taken  to the emergency room and evaluated. Blood sugar in the emergency room was 120. Since that time patient was continued to be fatigued and sleepy. Patient has been having increased thirst as well as weight loss over the past one year. No official diagnosis of diabetes.  PRIOR HPI (06/29/13): 23 year old male here for evaluation of seizures. February 2014 patient had first episode of possible seizure when he was at home, in the middle of an argument and dramatic event when a girl showed up at his home claiming that she had no place to go and needed to move in. Apparently she and the patient had gone out on a date one time previously. There was a dramatic scene, and the patient's family had to call the police to have her leave. While waiting for the police, patient began to stare, then lose consciousness, have convulsions for several minutes. He was incontinent of urine. Apparently he stopped breathing. His eyes were closed and he had stiffness in his muscles. This was witnessed by patient's mother. Patient was taken by ambulance to [facility] and diagnosed with an apparent stress reaction. After coming home patient was noted to be spitting up blood. He was taken to [facility] next day and diagnosed with possible gastric ulcer. 06/10/13, patient was in a car with his grandmother and girlfriend, when the grandmother noticed that his breathing changed. Apparently his eyes rolled back and he lost consciousness. Patient's mother was inside of the store at this time. Earlier in the day patient had gotten into a very heated  argument and was very upset. When patient's mother came back to the car, she called 911. Patient was taken to the hospital for evaluation. Patient was discharged with recommendation for close neurologic followup. In the past patient has never had any other episodes of loss of consciousness. He has had multiple episodes of "staring spells" starting at age 29-35 years old, continuing throughout  his life. Some of these episodes occur when he is upset. Some of these episodes occur without any trigger. They have been occurring at least once per month. Some of these episodes are able to be terminated by tapping patient on the shoulder. Other episodes cannot be terminated by tapping the patient shoulder. Patient is continuing to have psychiatry/psychology followup at [facility]. Review of prior hospital records indicate history of being bullied at school, prior suicidal thoughts and emotional trauma.  Birth/developmental history: Patient was born full-term ([redacted] weeks gestational age, 8 pounds weight, normal delivery without complication) with normal development until kindergarten. He talked with a few words starting at 5 or 6 months and started walking around 10 and half months. He had frequent ear infections with subsequent mild hearing loss. In kindergarten he had some difficulty and was held back. During his second try of kindergarten he was then diagnosed with hearing loss and possible ADHD. Age 4 years old he was diagnosed with bipolar disorder. He had some difficulty to school and in sixth grade through 12th grade attended some special education classes as well as traditional classes. He was able to graduate high school with a diploma as well as make the honor roll several times.  REVIEW OF SYSTEMS: Full 14 system review of systems performed and notable only for excess sweating hearing loss runny nose behavior problem depression hyperactive seizure env allergies cough.    ALLERGIES: Allergies  Allergen Reactions  . Amoxicillin-Pot Clavulanate Nausea And Vomiting  . Omeprazole Nausea And Vomiting  . Pineapple Swelling  . Strawberry Swelling  . Tomato Rash    HOME MEDICATIONS: Outpatient Prescriptions Prior to Visit  Medication Sig Dispense Refill  . albuterol (PROAIR HFA) 108 (90 BASE) MCG/ACT inhaler Inhale 2 puffs into the lungs every 4 (four) hours as needed for wheezing.  Persistent dry cough 6.7 g 2  . albuterol (PROVENTIL) (2.5 MG/3ML) 0.083% nebulizer solution Take 2.5 mg by nebulization every 6 (six) hours as needed. For shortness of breath    . atomoxetine (STRATTERA) 100 MG capsule Take 100 mg by mouth every morning.    . cloNIDine (CATAPRES) 0.3 MG tablet Take 0.2 mg by mouth at bedtime.     . divalproex (DEPAKOTE) 500 MG DR tablet Take 1 tablet (500 mg total) by mouth 2 (two) times daily. 60 tablet 12  . loratadine (CLARITIN) 10 MG tablet Take 10 mg by mouth at bedtime.    . mesalamine (APRISO) 0.375 G 24 hr capsule Take 4 capsules (1.5 g total) by mouth daily. 124 capsule 11  . methylphenidate (CONCERTA) 54 MG CR tablet Take 54 mg by mouth every morning.    . montelukast (SINGULAIR) 10 MG tablet Take 1 tablet (10 mg total) by mouth at bedtime. 30 tablet 3  . ranitidine (ZANTAC) 150 MG tablet Take 150 mg by mouth daily.     . sertraline (ZOLOFT) 50 MG tablet Take 100 mg by mouth daily.     . ondansetron (ZOFRAN ODT) 4 MG disintegrating tablet Take 1 tablet (4 mg total) by mouth every 8 (eight) hours as needed for nausea or vomiting. (  Patient not taking: Reported on 06/07/2014) 10 tablet 0  . oxyCODONE-acetaminophen (ROXICET) 5-325 MG per tablet Take 1 tablet by mouth every 4 (four) hours as needed for severe pain (for pain). (Patient not taking: Reported on 06/07/2014) 10 tablet 0   No facility-administered medications prior to visit.    PAST MEDICAL HISTORY: Past Medical History  Diagnosis Date  . Asthma   . ADHD (attention deficit hyperactivity disorder)   . Bipolar 1 disorder   . GERD (gastroesophageal reflux disease)   . Depression   . Anxiety   . Mental developmental delay 11/15/2012  . PONV (postoperative nausea and vomiting)   . Hearing loss   . HOH (hard of hearing)   . Abdominal pain, chronic, right upper quadrant   . Colitis   . Seizures     PAST SURGICAL HISTORY: Past Surgical History  Procedure Laterality Date  . Tonsillectomy     . Adenoidectomy    . Esophagogastroduodenoscopy (egd) with propofol N/A 12/07/2012    SLF:The mucosa of the esophagus appeared normal Non-erosive gastritis (inflammation) was found in the gastric antrum; multiple biopsies The duodenal mucosa showed no abnormalities in the bulb and second portion of the duodenum  . Esophageal biopsy N/A 12/07/2012    Procedure: GASTRIC BIOPSIES;  Surgeon: [name], MD;  Location: [facility];  Service: Endoscopy;  Laterality: N/A;  . Colonoscopy with propofol N/A 08/30/2013    WCB:JSEGBT mucosa in the terminal ileum/COLITIS/ MILD PROCTITIS. 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  . Esophageal biopsy N/A 08/30/2013    Procedure: BIOPSY;  Surgeon: [name], MD;  Location: [facility];  Service: Endoscopy;  Laterality: N/A;  right colon,transverse colon, left colon, rectal biopsies    FAMILY HISTORY: Family History  Problem Relation Age of Onset  . Asthma Mother   . Ulcers Mother   . Bipolar disorder Mother   . Colon cancer Neg Hx   . Liver disease Neg Hx   . ADD / ADHD Father   . Diabetes Maternal Grandmother   . Diabetes Maternal Grandfather     SOCIAL HISTORY:  History   Social History  . Marital Status: Single    Spouse Name: N/A    Number of Children: 0  . Years of Education: 12th   Occupational History  .      not working   Social History Main Topics  . Smoking status: Former Smoker -- 1.00 packs/day for 2 years    Types: Cigarettes    Quit date: 10/06/2012  . Smokeless tobacco: Never Used  . Alcohol Use: No  . Drug Use: No  . Sexual Activity: Yes   Other Topics Concern  . Not on file   Social History Narrative   Patient lives at home with girlfriend and family.   Caffeine Use: occasionally     PHYSICAL EXAM  Filed Vitals:   06/07/14 1340  BP: 148/94  Pulse: 110  Height: 5' 7"  (1.702 m)  Weight: 199 lb  (90.266 kg)    Not recorded      Body mass index is 31.16 kg/(m^2).   GENERAL EXAM: FUMING, STEWING, ANGRY APPEARING, GRIMACING, CLENCHING JAW, EYES CLOSED, RUBBING HANDS TOGETHER AND CLENCHING FISTS; NO EYE CONTACT; NOT RESPONDING VERBALLY; SHAKES HEAD YES AND NO.   CARDIOVASCULAR: Regular rate and rhythm, no murmurs, no carotid bruits  NEUROLOGIC: MENTAL STATUS: awake, alert, language fluent, comprehension intact, naming intact,  fund of knowledge appropriate CRANIAL NERVE: no papilledema on fundoscopic exam, pupils equal and reactive to light, visual fields full to confrontation, extraocular muscles intact, no nystagmus, facial sensation and strength symmetric, hearing intact, palate elevates symmetrically, uvula midline, shoulder shrug symmetric, tongue midline. MOTOR: normal bulk and tone, full strength in the BUE, BLE SENSORY: normal and symmetric to light touch, temperature, vibration COORDINATION: finger-nose-finger, fine finger movements normal REFLEXES: deep tendon reflexes present and symmetric GAIT/STATION: narrow based gait; able to walk on toes, heels and tandem; romberg is negative   DIAGNOSTIC DATA (LABS, IMAGING, TESTING) - I reviewed patient records, labs, notes, testing and imaging myself where available.  Lab Results  Component Value Date   WBC 7.2 03/12/2014   HGB 15.0 03/12/2014   HCT 42.5 03/12/2014   MCV 81.7 03/12/2014   PLT 241 03/12/2014      Component Value Date/Time   NA 141 03/12/2014 0000   K 3.9 03/12/2014 0000   CL 104 03/12/2014 0000   CO2 25 03/12/2014 0000   GLUCOSE 90 03/12/2014 0000   BUN 8 03/12/2014 0000   CREATININE 0.93 03/12/2014 0000   CALCIUM 9.8 03/12/2014 0000   PROT 7.3 03/12/2014 0000   ALBUMIN 3.9 03/12/2014 0000   AST 15 03/12/2014 0000   ALT 10 03/12/2014 0000   ALKPHOS 63 03/12/2014 0000   BILITOT 0.3 03/12/2014 0000   GFRNONAA >90 03/12/2014 0000   GFRAA >90 03/12/2014 0000   No results found for: CHOL Lab  Results  Component Value Date   HGBA1C 5.7* 08/03/2013   No results found for: VITAMINB12 Lab Results  Component Value Date   TSH 1.996 08/09/2013    I reviewed images myself and agree with interpretation.   10/20/10 CT head - normal   07/14/13 MRI brain - normal  07/21/13 EEG - normal  04/03/14 MRI brain - normal  03/30/14 EEG - normal    ASSESSMENT AND PLAN  23 y.o. year old male here with possible seizure disorder vs pseudo-seizures. MRIs, EEGs and VEEG all normal. There are significant contributing psychological and social factors/issues.   PLAN: - continue divalproex 562m BID for mood stabilization; [facility] monitor symptoms; if seizures / pseudo-sz continue, then would recommend second opinion with academic epilepsy center for definitive diagnosis - no driving or operating heavy machinery (pt does not have driver's license); caution with heights, swimming and other seizure precautions  Return if symptoms worsen or fail to improve, for return to PCP and psychiatry.   VPenni Bombard MD 116/03/9603 25:40PM Certified in Neurology, Neurophysiology and Neuroimaging  [facility] [address] [place] [phone]

## 2014-06-07 NOTE — Patient Instructions (Signed)
Follow up with [facility].

## 2014-07-02 ENCOUNTER — Encounter (HOSPITAL_COMMUNITY): Payer: Self-pay | Admitting: *Deleted

## 2014-07-02 ENCOUNTER — Other Ambulatory Visit (HOSPITAL_COMMUNITY): Payer: Self-pay | Admitting: Physician Assistant

## 2014-07-02 ENCOUNTER — Emergency Department (HOSPITAL_COMMUNITY)
Admission: EM | Admit: 2014-07-02 | Discharge: 2014-07-02 | Disposition: A | Discharge status: Home / Self Care | Payer: Medicaid Other | Source: Home / Self Care | Attending: Emergency Medicine | Admitting: Emergency Medicine

## 2014-07-02 ENCOUNTER — Emergency Department (HOSPITAL_COMMUNITY): Payer: Medicaid Other

## 2014-07-02 DIAGNOSIS — M79604 Pain in right leg: Secondary | ICD-10-CM

## 2014-07-02 DIAGNOSIS — Z87891 Personal history of nicotine dependence: Secondary | ICD-10-CM

## 2014-07-02 DIAGNOSIS — F419 Anxiety disorder, unspecified: Secondary | ICD-10-CM

## 2014-07-02 DIAGNOSIS — H9190 Unspecified hearing loss, unspecified ear: Secondary | ICD-10-CM

## 2014-07-02 DIAGNOSIS — G8929 Other chronic pain: Secondary | ICD-10-CM | POA: Diagnosis not present

## 2014-07-02 DIAGNOSIS — R000 Tachycardia, unspecified: Secondary | ICD-10-CM

## 2014-07-02 DIAGNOSIS — F909 Attention-deficit hyperactivity disorder, unspecified type: Secondary | ICD-10-CM

## 2014-07-02 DIAGNOSIS — G40909 Epilepsy, unspecified, not intractable, without status epilepticus: Secondary | ICD-10-CM | POA: Diagnosis not present

## 2014-07-02 DIAGNOSIS — Z880 Allergy status to penicillin: Secondary | ICD-10-CM | POA: Diagnosis not present

## 2014-07-02 DIAGNOSIS — F319 Bipolar disorder, unspecified: Secondary | ICD-10-CM | POA: Insufficient documentation

## 2014-07-02 DIAGNOSIS — Z79899 Other long term (current) drug therapy: Secondary | ICD-10-CM | POA: Insufficient documentation

## 2014-07-02 DIAGNOSIS — R52 Pain, unspecified: Secondary | ICD-10-CM

## 2014-07-02 DIAGNOSIS — J45909 Unspecified asthma, uncomplicated: Secondary | ICD-10-CM | POA: Insufficient documentation

## 2014-07-02 DIAGNOSIS — M79661 Pain in right lower leg: Secondary | ICD-10-CM

## 2014-07-02 MED ORDER — ACETAMINOPHEN-CODEINE #3 300-30 MG PO TABS: 1.0000 | ORAL_TABLET | Freq: Four times a day (QID) | ORAL | Status: DC | PRN

## 2014-07-02 MED ORDER — ACETAMINOPHEN-CODEINE #3 300-30 MG PO TABS
1.0000 | ORAL_TABLET | Freq: Once | ORAL | Status: AC
  Administered 2014-07-02: 1 via ORAL
  Filled 2014-07-02: qty 1

## 2014-07-02 NOTE — ED Notes (Signed)
Pt c/o right leg pain x 1 week; pt states it feels tight; pt denies any injury

## 2014-07-02 NOTE — ED Provider Notes (Signed)
CSN: [id number]     Arrival date & time 07/02/14  1922 History   First MD Initiated Contact with Patient 07/02/14 1945     Chief Complaint  Patient presents with  . Leg Pain     (Consider location/radiation/quality/duration/timing/severity/associated sxs/prior Treatment) The history is provided by the patient and medical records. No language interpreter was used.    Wayne NETHERTON is a 24 y.o. male  with a hx of asthma, ADHD, bipolar 1, GERD, anxiety, seizures presents to the Emergency Department complaining of gradual, persistent, progressively worsening right leg pain with associated "tightness" onset 1 week ago.  Pt reports he injured his right leg approx 3 years ago in high school, and he often has pain, but the current pain is new.  He reports no treatment PTA.  Associated symptoms include painful gait, but he reports he is able to weight bear.  Nothing makes it better and walking makes it worse.  Pt denies fever, chills, headache, neck pain, chest pain, SOB, abd pain, N/V/D, weakness, dizziness.  Patient denies recent travel however mother reports the patient has been somewhat less active in the last week. Both mother and patient deny visible swelling to the leg.   Past Medical History  Diagnosis Date  . Asthma   . ADHD (attention deficit hyperactivity disorder)   . Bipolar 1 disorder   . GERD (gastroesophageal reflux disease)   . Depression   . Anxiety   . Mental developmental delay 11/15/2012  . PONV (postoperative nausea and vomiting)   . Hearing loss   . HOH (hard of hearing)   . Abdominal pain, chronic, right upper quadrant   . Colitis   . Seizures    Past Surgical History  Procedure Laterality Date  . Tonsillectomy    . Adenoidectomy    . Esophagogastroduodenoscopy (egd) with propofol N/A 12/07/2012    SLF:The mucosa of the esophagus appeared normal Non-erosive gastritis (inflammation) was found in the gastric antrum; multiple biopsies The duodenal mucosa showed no  abnormalities in the bulb and second portion of the duodenum  . Esophageal biopsy N/A 12/07/2012    Procedure: GASTRIC BIOPSIES;  Surgeon: [name], MD;  Location: [facility];  Service: Endoscopy;  Laterality: N/A;  . Colonoscopy with propofol N/A 08/30/2013    HBZ:JIRCVE mucosa in the terminal ileum/COLITIS/ MILD PROCTITIS. 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  . Esophageal biopsy N/A 08/30/2013    Procedure: BIOPSY;  Surgeon: [name], MD;  Location: [facility];  Service: Endoscopy;  Laterality: N/A;  right colon,transverse colon, left colon, rectal biopsies   Family History  Problem Relation Age of Onset  . Asthma Mother   . Ulcers Mother   . Bipolar disorder Mother   . Colon cancer Neg Hx   . Liver disease Neg Hx   . ADD / ADHD Father   . Diabetes Maternal Grandmother   . Diabetes Maternal Grandfather    History  Substance Use Topics  . Smoking status: Former Smoker -- 1.00 packs/day for 2 years    Types: Cigarettes    Quit date: 10/06/2012  . Smokeless tobacco: Never Used  . Alcohol Use: No    Review of Systems  Constitutional: Negative for fever and chills.  Respiratory: Negative for chest tightness and shortness of breath.   Cardiovascular: Negative for chest pain and leg swelling.  Gastrointestinal: Negative for nausea and vomiting.  Musculoskeletal: Positive  for arthralgias (right leg). Negative for back pain, joint swelling, neck pain and neck stiffness.  Skin: Negative for wound.  Neurological: Negative for numbness.  Hematological: Does not bruise/bleed easily.  Psychiatric/Behavioral: The patient is not nervous/anxious.   All other systems reviewed and are negative.     Allergies  Amoxicillin-pot clavulanate; Omeprazole; Pineapple; Strawberry; and Tomato  Home Medications   Prior to Admission medications   Medication Sig Start Date End Date  Taking? Authorizing Provider  albuterol (PROAIR HFA) 108 (90 BASE) MCG/ACT inhaler Inhale 2 puffs into the lungs every 4 (four) hours as needed for wheezing. Persistent dry cough 09/21/13  Yes Sandi Mealy, MD  atomoxetine (STRATTERA) 100 MG capsule Take 100 mg by mouth every morning.   Yes Historical Provider, MD  divalproex (DEPAKOTE) 500 MG DR tablet Take 1 tablet (500 mg total) by mouth 2 (two) times daily. 03/08/14  Yes Penni Bombard, MD  loratadine (CLARITIN) 10 MG tablet Take 10 mg by mouth at bedtime.   Yes Historical Provider, MD  mesalamine (APRISO) 0.375 G 24 hr capsule Take 4 capsules (1.5 g total) by mouth daily. 09/29/13  Yes [name], MD  methylphenidate (CONCERTA) 54 MG CR tablet Take 54 mg by mouth every morning.   Yes Historical Provider, MD  montelukast (SINGULAIR) 10 MG tablet Take 1 tablet (10 mg total) by mouth at bedtime. 09/21/13  Yes Sandi Mealy, MD  ranitidine (ZANTAC) 150 MG tablet Take 150 mg by mouth daily.    Yes Historical Provider, MD  sertraline (ZOLOFT) 100 MG tablet Take 100 mg by mouth daily. 06/12/14  Yes Historical Provider, MD  traZODone (DESYREL) 100 MG tablet Take 100 mg by mouth at bedtime. 06/19/14  Yes Historical Provider, MD  acetaminophen-codeine (TYLENOL #3) 300-30 MG per tablet Take 1-2 tablets by mouth every 6 (six) hours as needed for moderate pain. 07/02/14   Preesha Benjamin, PA-C  albuterol (PROVENTIL) (2.5 MG/3ML) 0.083% nebulizer solution Take 2.5 mg by nebulization every 6 (six) hours as needed. For shortness of breath    Historical Provider, MD  ondansetron (ZOFRAN ODT) 4 MG disintegrating tablet Take 1 tablet (4 mg total) by mouth every 8 (eight) hours as needed for nausea or vomiting. Patient not taking: Reported on 06/07/2014 03/13/14   Karen Chafe Molpus, MD  oxyCODONE-acetaminophen (ROXICET) 5-325 MG per tablet Take 1 tablet by mouth every 4 (four) hours as needed for severe pain (for pain). Patient not taking: Reported on  06/07/2014 03/13/14   Karen Chafe Molpus, MD   BP 113/72 mmHg  Pulse 90  Temp(Src) 99.1 F (37.3 C) (Oral)  Resp 24  Ht 5' 7"  (1.702 m)  Wt 206 lb (93.441 kg)  BMI 32.26 kg/m2  SpO2 98% Physical Exam  Constitutional: He appears well-developed and well-nourished. No distress.  HENT:  Head: Normocephalic and atraumatic.  Eyes: Conjunctivae are normal.  Neck: Normal range of motion.  Cardiovascular: Normal rate, regular rhythm, normal heart sounds and intact distal pulses.   No murmur heard. Capillary refill < 3 sec  Pulmonary/Chest: Effort normal and breath sounds normal.  Clear and equal breath sounds Equal chest expansion  Musculoskeletal: He exhibits tenderness. He exhibits no edema.  ROM: Full ROM of all joints on the right leg TTP of the right calf and medial knee without swelling, edema or erythema NO increased warmth to the leg  Neurological: He is alert. Coordination normal.  Sensation intact to dull and sharp Strength 5/5 in the BLE Pt ambulates  with antalgic but steady  Skin: Skin is warm and dry. He is not diaphoretic.  No tenting of the skin  Psychiatric: He has a normal mood and affect.  Nursing note and vitals reviewed.   ED Course  Procedures (including critical care time) Labs Review Labs Reviewed - No data to display  Imaging Review Dg Tibia/fibula Right  07/02/2014   CLINICAL DATA:  Pt c/o pain in Rt knee with radiation into Rt posterior lower leg x 1 week; pt states it feels tight; pt denies any injury  EXAM: RIGHT TIBIA AND FIBULA - 2 VIEW  COMPARISON:  None.  FINDINGS: There is no evidence of fracture or other focal bone lesions. Soft tissues are unremarkable.  IMPRESSION: Negative.   Electronically Signed   By: Lajean Manes M.D.   On: 07/02/2014 20:30   Dg Knee Complete 4 Views Right  07/02/2014   CLINICAL DATA:  Acute onset of right knee pain, radiating to the right posterior lower leg. Initial encounter.  EXAM: RIGHT KNEE - COMPLETE 4+ VIEW  COMPARISON:   None.  FINDINGS: There is no evidence of fracture or dislocation. The joint spaces are preserved. No significant degenerative change is seen; the patellofemoral joint is grossly unremarkable in appearance.  No significant joint effusion is seen. The visualized soft tissues are normal in appearance.  IMPRESSION: No evidence of fracture or dislocation.   Electronically Signed   By: Garald Balding M.D.   On: 07/02/2014 20:34     EKG Interpretation None      MDM   Final diagnoses:  Pain  Right leg pain   ANDREZ LIEURANCE presents with right leg pain.  Pt is low risk for DVT, but mother reports decreased activity in the last week or so.  Patient X-Ray negative for obvious fracture or dislocation; no degenerative changes. Pain managed in ED. Pt advised to return tomorrow for venous duplex scan to r;/o DVT.  Pt initially with mild tachycardia on arrival, but denies CP of SOB and I believe this is related to pain.  Pt was not tachycardic on my physical exam.  Pt's tachycardia resolved with pain control prior to discharge.  Highly doubt PE.  Conservative therapy recommended and discussed. Patient will be dc home & is agreeable with above plan.  I have personally reviewed patient's vitals, nursing note and any pertinent labs or imaging.  I performed an focused physical exam; undressed when appropriate .    It has been determined that no acute conditions requiring further emergency intervention are present at this time. The patient/guardian have been advised of the diagnosis and plan. I reviewed any labs and imaging including any potential incidental findings. We have discussed signs and symptoms that warrant return to the ED and they are listed in the discharge instructions.    Vital signs are stable at discharge.   BP 113/72 mmHg  Pulse 90  Temp(Src) 99.1 F (37.3 C) (Oral)  Resp 24  Ht 5' 7"  (1.702 m)  Wt 206 lb (93.441 kg)  BMI 32.26 kg/m2  SpO2 98%         Abigail Butts,  PA-C 07/02/14 2105  Veryl Speak, MD 07/02/14 [phone]

## 2014-07-02 NOTE — Discharge Instructions (Signed)
1. Medications: tylenol #3, usual home medications 2. Treatment: rest, drink plenty of fluids,  3. Follow Up: Please followup with your primary doctor in 7 days for discussion of your diagnoses and further evaluation after today's visit; if you do not have a primary care doctor use the resource guide provided to find one; Please return to the ER for worsening pain, fevers, leg swelling or other concerns  IMPORTANT PATIENT INSTRUCTIONS:  You have been scheduled for an Outpatient Ultrasound.    Your appointment has been scheduled for:  _______ am/pm on _______________ (date).  If your appointment is scheduled for a Saturday or Sunday, please go to the [facility] Emergency Department Registration Desk at least 15 minutes prior to your appointment time and tell them you are there for an ultrasound.    If your appointment is scheduled for a weekday (Monday-Friday), please go directly to the [facility] Radiology Department at least 15 minutes prior to your appointment time and tell them you are there for an ultrasound.  Please call [phone] with questions.

## 2014-07-03 ENCOUNTER — Ambulatory Visit (HOSPITAL_COMMUNITY)
Admission: EM | Admit: 2014-07-03 | Discharge: 2014-07-03 | Disposition: A | Discharge status: Home / Self Care | Payer: Medicaid Other | Attending: Emergency Medicine | Admitting: Emergency Medicine

## 2014-07-03 DIAGNOSIS — M79661 Pain in right lower leg: Secondary | ICD-10-CM

## 2014-07-03 DIAGNOSIS — M79604 Pain in right leg: Secondary | ICD-10-CM

## 2014-07-03 NOTE — ED Provider Notes (Signed)
He returned today for follow-up on his right calf discomfort.  His mother states he continues to limp.  Doppler study right leg negative for DVT.  Mother does not have any additional concerns.  I advised her to help his discomfort by using Tylenol, elevation, and an Ace wrap for comfort.  Mother expressed understanding and did not have any additional questions.  Richarda Blade, MD 07/03/14 [phone]

## 2014-09-04 ENCOUNTER — Encounter (HOSPITAL_COMMUNITY): Payer: Self-pay

## 2014-09-04 ENCOUNTER — Emergency Department (HOSPITAL_COMMUNITY)
Admission: EM | Admit: 2014-09-04 | Discharge: 2014-09-05 | Disposition: A | Discharge status: Home / Self Care | Payer: Medicaid Other | Attending: Emergency Medicine | Admitting: Emergency Medicine

## 2014-09-04 DIAGNOSIS — N390 Urinary tract infection, site not specified: Secondary | ICD-10-CM

## 2014-09-04 DIAGNOSIS — F909 Attention-deficit hyperactivity disorder, unspecified type: Secondary | ICD-10-CM | POA: Insufficient documentation

## 2014-09-04 DIAGNOSIS — Z9889 Other specified postprocedural states: Secondary | ICD-10-CM | POA: Diagnosis not present

## 2014-09-04 DIAGNOSIS — F319 Bipolar disorder, unspecified: Secondary | ICD-10-CM | POA: Insufficient documentation

## 2014-09-04 DIAGNOSIS — Z87891 Personal history of nicotine dependence: Secondary | ICD-10-CM | POA: Insufficient documentation

## 2014-09-04 DIAGNOSIS — F419 Anxiety disorder, unspecified: Secondary | ICD-10-CM | POA: Insufficient documentation

## 2014-09-04 DIAGNOSIS — K219 Gastro-esophageal reflux disease without esophagitis: Secondary | ICD-10-CM | POA: Insufficient documentation

## 2014-09-04 DIAGNOSIS — J45909 Unspecified asthma, uncomplicated: Secondary | ICD-10-CM | POA: Diagnosis not present

## 2014-09-04 DIAGNOSIS — Z79899 Other long term (current) drug therapy: Secondary | ICD-10-CM | POA: Insufficient documentation

## 2014-09-04 DIAGNOSIS — R109 Unspecified abdominal pain: Secondary | ICD-10-CM

## 2014-09-04 DIAGNOSIS — Z880 Allergy status to penicillin: Secondary | ICD-10-CM | POA: Insufficient documentation

## 2014-09-04 DIAGNOSIS — H9190 Unspecified hearing loss, unspecified ear: Secondary | ICD-10-CM | POA: Diagnosis not present

## 2014-09-04 DIAGNOSIS — R1031 Right lower quadrant pain: Secondary | ICD-10-CM | POA: Diagnosis present

## 2014-09-04 MED ORDER — SODIUM CHLORIDE 0.9 % IV BOLUS (SEPSIS)
1000.0000 mL | Freq: Once | INTRAVENOUS | Status: AC
  Administered 2014-09-05: 1000 mL via INTRAVENOUS

## 2014-09-04 MED ORDER — ONDANSETRON HCL 4 MG/2ML IJ SOLN
4.0000 mg | Freq: Once | INTRAMUSCULAR | Status: AC
  Administered 2014-09-05: 4 mg via INTRAVENOUS
  Filled 2014-09-04: qty 2

## 2014-09-04 MED ORDER — HYDROMORPHONE HCL 1 MG/ML IJ SOLN
1.0000 mg | Freq: Once | INTRAMUSCULAR | Status: AC
  Administered 2014-09-05: 1 mg via INTRAVENOUS
  Filled 2014-09-04: qty 1

## 2014-09-04 MED ORDER — SODIUM CHLORIDE 0.9 % IV SOLN
Freq: Once | INTRAVENOUS | Status: AC
  Administered 2014-09-05: 1000 mL via INTRAVENOUS

## 2014-09-04 NOTE — ED Notes (Signed)
Onset of lower right side abd pain since 9am denies nausea/vomiting but states "my stomach just isn't in the mood to eat"

## 2014-09-04 NOTE — ED Provider Notes (Signed)
CSN: [id number]     Arrival date & time 09/04/14  2327 History   First MD Initiated Contact with Patient 09/04/14 2348    This chart was scribed for Delora Fuel, MD by Terressa Koyanagi, ED Scribe. This patient was seen in room [facility] and the patient's care was started at 11:54 PM.  Chief Complaint  Patient presents with  . Abdominal Pain   The history is provided by the patient. No language interpreter was used.   PCP: [facility] HPI Comments: Wayne Avila is a 24 y.o. male, with PMH noted below, who presents to the Emergency Department complaining of non-radiating, left sided, sharp, constant abd pain onset this morning. Pt rates his pain a 9 out of 10; and notes going over street bumps on the ride over to the ED aggravated his abd pain. Pt denies appendectomy, constipation, diarrhea, n/v, any urinary Sx.   Past Medical History  Diagnosis Date  . Asthma   . ADHD (attention deficit hyperactivity disorder)   . Bipolar 1 disorder   . GERD (gastroesophageal reflux disease)   . Depression   . Anxiety   . Mental developmental delay 11/15/2012  . PONV (postoperative nausea and vomiting)   . Hearing loss   . HOH (hard of hearing)   . Abdominal pain, chronic, right upper quadrant   . Colitis   . Seizures    Past Surgical History  Procedure Laterality Date  . Tonsillectomy    . Adenoidectomy    . Esophagogastroduodenoscopy (egd) with propofol N/A 12/07/2012    SLF:The mucosa of the esophagus appeared normal Non-erosive gastritis (inflammation) was found in the gastric antrum; multiple biopsies The duodenal mucosa showed no abnormalities in the bulb and second portion of the duodenum  . Esophageal biopsy N/A 12/07/2012    Procedure: GASTRIC BIOPSIES;  Surgeon: [name], MD;  Location: [facility];  Service: Endoscopy;  Laterality: N/A;  . Colonoscopy with propofol N/A 08/30/2013    SNK:NLZJQB mucosa in the terminal ileum/COLITIS/ MILD PROCTITIS. 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  . Esophageal biopsy N/A 08/30/2013    Procedure: BIOPSY;  Surgeon: [name], MD;  Location: [facility];  Service: Endoscopy;  Laterality: N/A;  right colon,transverse colon, left colon, rectal biopsies   Family History  Problem Relation Age of Onset  . Asthma Mother   . Ulcers Mother   . Bipolar disorder Mother   . Colon cancer Neg Hx   . Liver disease Neg Hx   . ADD / ADHD Father   . Diabetes Maternal Grandmother   . Diabetes Maternal Grandfather    History  Substance Use Topics  . Smoking status: Former Smoker -- 1.00 packs/day for 2 years    Types: Cigarettes    Quit date: 10/06/2012  . Smokeless tobacco: Never Used  . Alcohol Use: No    Review of Systems  Constitutional: Negative for fever and chills.  Gastrointestinal: Positive for abdominal pain (RLQ). Negative for nausea, vomiting, diarrhea and constipation.  Genitourinary: Negative for dysuria and difficulty urinating.  Psychiatric/Behavioral: Negative for confusion.  All other systems reviewed and are negative.     Allergies  Amoxicillin-pot clavulanate; Omeprazole; Pineapple; Strawberry; and Tomato  Home Medications   Prior to Admission medications   Medication Sig Start Date End Date Taking? Authorizing Provider  acetaminophen-codeine (TYLENOL #3) 300-30 MG per tablet Take 1-2 tablets by mouth every 6 (  six) hours as needed for moderate pain. 07/02/14   Hannah Muthersbaugh, PA-C  albuterol (PROAIR HFA) 108 (90 BASE) MCG/ACT inhaler Inhale 2 puffs into the lungs every 4 (four) hours as needed for wheezing. Persistent dry cough 09/21/13   Sandi Mealy, MD  albuterol (PROVENTIL) (2.5 MG/3ML) 0.083% nebulizer solution Take 2.5 mg by nebulization every 6 (six) hours as needed. For shortness of breath    Historical Provider, MD  atomoxetine (STRATTERA) 100 MG capsule Take 100 mg by mouth every  morning.    Historical Provider, MD  divalproex (DEPAKOTE) 500 MG DR tablet Take 1 tablet (500 mg total) by mouth 2 (two) times daily. 03/08/14   Penni Bombard, MD  loratadine (CLARITIN) 10 MG tablet Take 10 mg by mouth at bedtime.    Historical Provider, MD  mesalamine (APRISO) 0.375 G 24 hr capsule Take 4 capsules (1.5 g total) by mouth daily. 09/29/13   [name], MD  methylphenidate (CONCERTA) 54 MG CR tablet Take 54 mg by mouth every morning.    Historical Provider, MD  montelukast (SINGULAIR) 10 MG tablet Take 1 tablet (10 mg total) by mouth at bedtime. 09/21/13   Sandi Mealy, MD  ondansetron (ZOFRAN ODT) 4 MG disintegrating tablet Take 1 tablet (4 mg total) by mouth every 8 (eight) hours as needed for nausea or vomiting. Patient not taking: Reported on 06/07/2014 03/13/14   Shanon Rosser, MD  oxyCODONE-acetaminophen (ROXICET) 5-325 MG per tablet Take 1 tablet by mouth every 4 (four) hours as needed for severe pain (for pain). Patient not taking: Reported on 06/07/2014 03/13/14   Shanon Rosser, MD  ranitidine (ZANTAC) 150 MG tablet Take 150 mg by mouth daily.     Historical Provider, MD  sertraline (ZOLOFT) 100 MG tablet Take 100 mg by mouth daily. 06/12/14   Historical Provider, MD  traZODone (DESYREL) 100 MG tablet Take 200 mg by mouth at bedtime.  06/19/14   Historical Provider, MD   Triage Vitals: BP 124/79 mmHg  Temp(Src) 98.8 F (37.1 C) (Oral)  Resp 16  Ht 5' 7"  (1.702 m)  Wt 193 lb 5 oz (87.686 kg)  BMI 30.27 kg/m2  SpO2 100% Physical Exam  Constitutional: He is oriented to person, place, and time. He appears well-developed and well-nourished. No distress.  HENT:  Head: Normocephalic and atraumatic.  Eyes: Conjunctivae and EOM are normal. Pupils are equal, round, and reactive to light.  Neck: Normal range of motion. Neck supple. No JVD present. No tracheal deviation present.  Cardiovascular: Normal rate, regular rhythm and normal heart sounds.   No murmur  heard. Pulmonary/Chest: Effort normal and breath sounds normal. He has no wheezes. He has no rales. He exhibits no tenderness.  Abdominal: Soft. He exhibits no distension and no mass. There is tenderness in the right upper quadrant and right lower quadrant. There is no rebound and no guarding.  RUQ and RLQ tenderness. Tendernes with percussion. Pain illicited with cough. Bowel sounds decreased.   Musculoskeletal: Normal range of motion. He exhibits no edema or tenderness.  Lymphadenopathy:    He has no cervical adenopathy.  Neurological: He is alert and oriented to person, place, and time. No cranial nerve deficit. Coordination normal.  Skin: Skin is warm and dry. No rash noted.  Psychiatric: He has a normal mood and affect.  Nursing note and vitals reviewed.   ED Course  Procedures (including critical care time) DIAGNOSTIC STUDIES: Oxygen Saturation is 100% on RA, nl by my interpretation.  COORDINATION OF CARE: 11:58 PM-Discussed treatment plan which includes meds, UA, CT scan, labs with pt at bedside and pt agreed to plan.   Labs Review Results for orders placed or performed during the hospital encounter of 09/04/14  Urinalysis, Routine w reflex microscopic  Result Value Ref Range   Color, Urine YELLOW YELLOW   APPearance CLEAR CLEAR   Specific Gravity, Urine >1.030 (H) 1.005 - 1.030   pH 6.0 5.0 - 8.0   Glucose, UA NEGATIVE NEGATIVE mg/dL   Hgb urine dipstick TRACE (A) NEGATIVE   Bilirubin Urine NEGATIVE NEGATIVE   Ketones, ur NEGATIVE NEGATIVE mg/dL   Protein, ur 30 (A) NEGATIVE mg/dL   Urobilinogen, UA 0.2 0.0 - 1.0 mg/dL   Nitrite NEGATIVE NEGATIVE   Leukocytes, UA TRACE (A) NEGATIVE  Comprehensive metabolic panel  Result Value Ref Range   Sodium 136 135 - 145 mmol/L   Potassium 3.8 3.5 - 5.1 mmol/L   Chloride 105 96 - 112 mmol/L   CO2 23 19 - 32 mmol/L   Glucose, Bld 90 70 - 99 mg/dL   BUN 12 6 - 23 mg/dL   Creatinine, Ser 0.90 0.50 - 1.35 mg/dL   Calcium 9.1  8.4 - 10.5 mg/dL   Total Protein 6.8 6.0 - 8.3 g/dL   Albumin 3.9 3.5 - 5.2 g/dL   AST 20 0 - 37 U/L   ALT 12 0 - 53 U/L   Alkaline Phosphatase 51 39 - 117 U/L   Total Bilirubin 0.3 0.3 - 1.2 mg/dL   GFR calc non Af Amer >90 >90 mL/min   GFR calc Af Amer >90 >90 mL/min   Anion gap 8 5 - 15  Lipase, blood  Result Value Ref Range   Lipase 23 11 - 59 U/L  CBC with Differential  Result Value Ref Range   WBC 6.5 4.0 - 10.5 K/uL   RBC 4.69 4.22 - 5.81 MIL/uL   Hemoglobin 13.6 13.0 - 17.0 g/dL   HCT 39.0 39.0 - 52.0 %   MCV 83.2 78.0 - 100.0 fL   MCH 29.0 26.0 - 34.0 pg   MCHC 34.9 30.0 - 36.0 g/dL   RDW 12.5 11.5 - 15.5 %   Platelets 198 150 - 400 K/uL   Neutrophils Relative % 64 43 - 77 %   Neutro Abs 4.1 1.7 - 7.7 K/uL   Lymphocytes Relative 28 12 - 46 %   Lymphs Abs 1.8 0.7 - 4.0 K/uL   Monocytes Relative 6 3 - 12 %   Monocytes Absolute 0.4 0.1 - 1.0 K/uL   Eosinophils Relative 2 0 - 5 %   Eosinophils Absolute 0.1 0.0 - 0.7 K/uL   Basophils Relative 0 0 - 1 %   Basophils Absolute 0.0 0.0 - 0.1 K/uL  Urine microscopic-add on  Result Value Ref Range   Squamous Epithelial / LPF FEW (A) RARE   WBC, UA TOO NUMEROUS TO COUNT <3 WBC/hpf   RBC / HPF 0-2 <3 RBC/hpf   Bacteria, UA MANY (A) RARE   Imaging Review Ct Abdomen Pelvis W Contrast  09/05/2014   CLINICAL DATA:  Non radiating left-sided sharp constant abdominal pain for 1 day.  EXAM: CT ABDOMEN AND PELVIS WITH CONTRAST  TECHNIQUE: Multidetector CT imaging of the abdomen and pelvis was performed using the standard protocol following bolus administration of intravenous contrast.  CONTRAST:  52m OMNIPAQUE IOHEXOL 300 MG/ML SOLN, 1021mOMNIPAQUE IOHEXOL 300 MG/ML SOLN  COMPARISON:  CT 09/13/2013  FINDINGS: The included  lung bases are clear.  Liver, gallbladder, spleen, adrenal glands, and pancreas are normal. Kidneys demonstrate symmetric enhancement without hydronephrosis or perinephric stranding. No focal renal abnormality.  The  stomach appears distended with ingested contrast. No small bowel dilatation. The appendix is normal. Moderate volume of stool throughout the colon, no colonic wall thickening. No free air, free fluid, or intra-abdominal fluid collection.  Abdominal aorta is normal in caliber. No retroperitoneal adenopathy.  Within the pelvis the urinary bladder is decompressed. No pelvic free fluid. No pelvic adenopathy.  There are no acute or suspicious osseous abnormalities.  IMPRESSION: 1. No acute abnormality in the abdomen/pelvis. 2. Stomach appears mildly distended with contrast, this may be physiologic and related to contrast congestion, gastroparesis could have a similar appearance. Nonemergent gastric emptying study could be considered based on clinical concern.   Electronically Signed   By: Jeb Levering M.D.   On: 09/05/2014 02:16     MDM   Final diagnoses:  Abdominal pain, unspecified abdominal location  Urinary tract infection without hematuria, site unspecified    Right-sided abdominal pain with indirect findings of peritoneal irritation. Old records are reviewed and he does carry a diagnosis of ulcerative colitis even though recent CT scan showed no acute findings. Localized tenderness, he is sent for CT scan to rule out appendicitis. He was also given IV fluids and ondansetron for nausea and hydromorphone for pain. CT scan has come back with no acute findings. Laboratory workup is significant for urinary tract infection with too numerous to count WBCs and many bacteria. He is given a dose of ceftriaxone in the ED and is discharged with prescription for cephalexin.  I personally performed the services described in this documentation, which was scribed in my presence. The recorded information has been reviewed and is accurate.      Delora Fuel, MD 50/03/70 [id number]

## 2014-09-05 ENCOUNTER — Emergency Department (HOSPITAL_COMMUNITY): Payer: Medicaid Other

## 2014-09-05 LAB — URINALYSIS, ROUTINE W REFLEX MICROSCOPIC
Bilirubin Urine: NEGATIVE
Glucose, UA: NEGATIVE mg/dL
Ketones, ur: NEGATIVE mg/dL
Nitrite: NEGATIVE
Protein, ur: 30 mg/dL — AB
Specific Gravity, Urine: >1.03 — ABNORMAL HIGH (ref 1.005–1.030)
Urobilinogen, UA: 0.2 mg/dL (ref 0.0–1.0)
pH: 6 (ref 5.0–8.0)

## 2014-09-05 LAB — CBC WITH DIFFERENTIAL/PLATELET
Basophils Absolute: 0 10*3/uL (ref 0.0–0.1)
Basophils Relative: 0 % (ref 0–1)
Eosinophils Absolute: 0.1 10*3/uL (ref 0.0–0.7)
Eosinophils Relative: 2 % (ref 0–5)
HCT: 39 % (ref 39.0–52.0)
Hemoglobin: 13.6 g/dL (ref 13.0–17.0)
Lymphocytes Relative: 28 % (ref 12–46)
Lymphs Abs: 1.8 10*3/uL (ref 0.7–4.0)
MCH: 29 pg (ref 26.0–34.0)
MCHC: 34.9 g/dL (ref 30.0–36.0)
MCV: 83.2 fL (ref 78.0–100.0)
Monocytes Absolute: 0.4 10*3/uL (ref 0.1–1.0)
Monocytes Relative: 6 % (ref 3–12)
Neutro Abs: 4.1 10*3/uL (ref 1.7–7.7)
Neutrophils Relative %: 64 % (ref 43–77)
Platelets: 198 10*3/uL (ref 150–400)
RBC: 4.69 MIL/uL (ref 4.22–5.81)
RDW: 12.5 % (ref 11.5–15.5)
WBC: 6.5 10*3/uL (ref 4.0–10.5)

## 2014-09-05 LAB — COMPREHENSIVE METABOLIC PANEL
ALT: 12 U/L (ref 0–53)
AST: 20 U/L (ref 0–37)
Albumin: 3.9 g/dL (ref 3.5–5.2)
Alkaline Phosphatase: 51 U/L (ref 39–117)
Anion gap: 8 (ref 5–15)
BUN: 12 mg/dL (ref 6–23)
CO2: 23 mmol/L (ref 19–32)
Calcium: 9.1 mg/dL (ref 8.4–10.5)
Chloride: 105 mmol/L (ref 96–112)
Creatinine, Ser: 0.9 mg/dL (ref 0.50–1.35)
GFR calc Af Amer: >90 mL/min (ref >90)
GFR calc non Af Amer: >90 mL/min (ref >90)
Glucose, Bld: 90 mg/dL (ref 70–99)
Potassium: 3.8 mmol/L (ref 3.5–5.1)
Sodium: 136 mmol/L (ref 135–145)
Total Bilirubin: 0.3 mg/dL (ref 0.3–1.2)
Total Protein: 6.8 g/dL (ref 6.0–8.3)

## 2014-09-05 LAB — URINE MICROSCOPIC-ADD ON

## 2014-09-05 LAB — LIPASE, BLOOD: Lipase: 23 U/L (ref 11–59)

## 2014-09-05 MED ORDER — SODIUM CHLORIDE 0.9 % IJ SOLN
INTRAMUSCULAR | Status: AC
  Filled 2014-09-05: qty 42

## 2014-09-05 MED ORDER — CEFTRIAXONE SODIUM 1 G IJ SOLR
1.0000 g | Freq: Once | INTRAMUSCULAR | Status: AC
  Administered 2014-09-05: 1 g via INTRAVENOUS
  Filled 2014-09-05: qty 10

## 2014-09-05 MED ORDER — IOHEXOL 300 MG/ML  SOLN
50.0000 mL | Freq: Once | INTRAMUSCULAR | Status: AC | PRN
  Administered 2014-09-05: 50 mL via ORAL

## 2014-09-05 MED ORDER — IOHEXOL 300 MG/ML  SOLN
100.0000 mL | Freq: Once | INTRAMUSCULAR | Status: AC | PRN
  Administered 2014-09-05: 100 mL via INTRAVENOUS

## 2014-09-05 MED ORDER — CEPHALEXIN 500 MG PO CAPS: 500.0000 mg | ORAL_CAPSULE | Freq: Four times a day (QID) | ORAL | Status: DC

## 2014-09-05 NOTE — Discharge Instructions (Signed)
Urinary Tract Infection Urinary tract infections (UTIs) can develop anywhere along your urinary tract. Your urinary tract is your body's drainage system for removing wastes and extra water. Your urinary tract includes two kidneys, two ureters, a bladder, and a urethra. Your kidneys are a pair of bean-shaped organs. Each kidney is about the size of your fist. They are located below your ribs, one on each side of your spine. CAUSES Infections are caused by microbes, which are microscopic organisms, including fungi, viruses, and bacteria. These organisms are so small that they can only be seen through a microscope. Bacteria are the microbes that most commonly cause UTIs. SYMPTOMS  Symptoms of UTIs may vary by age and gender of the patient and by the location of the infection. Symptoms in young women typically include a frequent and intense urge to urinate and a painful, burning feeling in the bladder or urethra during urination. Older women and men are more likely to be tired, shaky, and weak and have muscle aches and abdominal pain. A fever may mean the infection is in your kidneys. Other symptoms of a kidney infection include pain in your back or sides below the ribs, nausea, and vomiting. DIAGNOSIS To diagnose a UTI, your caregiver will ask you about your symptoms. Your caregiver also will ask to provide a urine sample. The urine sample will be tested for bacteria and white blood cells. White blood cells are made by your body to help fight infection. TREATMENT  Typically, UTIs can be treated with medication. Because most UTIs are caused by a bacterial infection, they usually can be treated with the use of antibiotics. The choice of antibiotic and length of treatment depend on your symptoms and the type of bacteria causing your infection. HOME CARE INSTRUCTIONS  If you were prescribed antibiotics, take them exactly as your caregiver instructs you. Finish the medication even if you feel better after you  have only taken some of the medication.  Drink enough water and fluids to keep your urine clear or pale yellow.  Avoid caffeine, tea, and carbonated beverages. They tend to irritate your bladder.  Empty your bladder often. Avoid holding urine for long periods of time.  Empty your bladder before and after sexual intercourse.  After a bowel movement, women should cleanse from front to back. Use each tissue only once. SEEK MEDICAL CARE IF:   You have back pain.  You develop a fever.  Your symptoms do not begin to resolve within 3 days. SEEK IMMEDIATE MEDICAL CARE IF:   You have severe back pain or lower abdominal pain.  You develop chills.  You have nausea or vomiting.  You have continued burning or discomfort with urination. MAKE SURE YOU:   Understand these instructions.  Will watch your condition.  Will get help right away if you are not doing well or get worse. Document Released: 03/05/2005 Document Revised: 11/25/2011 Document Reviewed: 07/04/2011 [facility] Patient Information 2015 [place], [place]. This information is not intended to replace advice given to you by your health care provider. Make sure you discuss any questions you have with your health care provider.  Cephalexin tablets or capsules What is this medicine? CEPHALEXIN (sef a LEX in) is a cephalosporin antibiotic. It is used to treat certain kinds of bacterial infections It will not work for colds, flu, or other viral infections. This medicine may be used for other purposes; ask your health care provider or pharmacist if you have questions. COMMON BRAND NAME(S): Biocef, Keflex, Keftab What should I  tell my health care provider before I take this medicine? They need to know if you have any of these conditions: -kidney disease -stomach or intestine problems, especially colitis -an unusual or allergic reaction to cephalexin, other cephalosporins, penicillins, other antibiotics, medicines, foods, dyes or  preservatives -pregnant or trying to get pregnant -breast-feeding How should I use this medicine? Take this medicine by mouth with a full glass of water. Follow the directions on the prescription label. This medicine can be taken with or without food. Take your medicine at regular intervals. Do not take your medicine more often than directed. Take all of your medicine as directed even if you think you are better. Do not skip doses or stop your medicine early. Talk to your pediatrician regarding the use of this medicine in children. While this drug may be prescribed for selected conditions, precautions do apply. Overdosage: If you think you have taken too much of this medicine contact a poison control center or emergency room at once. NOTE: This medicine is only for you. Do not share this medicine with others. What if I miss a dose? If you miss a dose, take it as soon as you can. If it is almost time for your next dose, take only that dose. Do not take double or extra doses. There should be at least 4 to 6 hours between doses. What may interact with this medicine? -probenecid -some other antibiotics This list may not describe all possible interactions. Give your health care provider a list of all the medicines, herbs, non-prescription drugs, or dietary supplements you use. Also tell them if you smoke, drink alcohol, or use illegal drugs. Some items may interact with your medicine. What should I watch for while using this medicine? Tell your doctor or health care professional if your symptoms do not begin to improve in a few days. Do not treat diarrhea with over the counter products. Contact your doctor if you have diarrhea that lasts more than 2 days or if it is severe and watery. If you have diabetes, you may get a false-positive result for sugar in your urine. Check with your doctor or health care professional. What side effects may I notice from receiving this medicine? Side effects that you  should report to your doctor or health care professional as soon as possible: -allergic reactions like skin rash, itching or hives, swelling of the face, lips, or tongue -breathing problems -pain or trouble passing urine -redness, blistering, peeling or loosening of the skin, including inside the mouth -severe or watery diarrhea -unusually weak or tired -yellowing of the eyes, skin Side effects that usually do not require medical attention (report to your doctor or health care professional if they continue or are bothersome): -gas or heartburn -genital or anal irritation -headache -joint or muscle pain -nausea, vomiting This list may not describe all possible side effects. Call your doctor for medical advice about side effects. You may report side effects to FDA at 1-800-FDA-1088. Where should I keep my medicine? Keep out of the reach of children. Store at room temperature between 59 and 86 degrees F (15 and 30 degrees C). Throw away any unused medicine after the expiration date. NOTE: This sheet is a summary. It may not cover all possible information. If you have questions about this medicine, talk to your doctor, pharmacist, or health care provider.  2015, [employer]vier/Gold Standard. (2007-08-30 17:09:13)   Abdominal Pain Many things can cause abdominal pain. Usually, abdominal pain is not caused by a disease  and will improve without treatment. It can often be observed and treated at home. Your health care provider will do a physical exam and possibly order blood tests and X-rays to help determine the seriousness of your pain. However, in many cases, more time must pass before a clear cause of the pain can be found. Before that point, your health care provider may not know if you need more testing or further treatment. HOME CARE INSTRUCTIONS  Monitor your abdominal pain for any changes. The following actions may help to alleviate any discomfort you are experiencing:  Only take  over-the-counter or prescription medicines as directed by your health care provider.  Do not take laxatives unless directed to do so by your health care provider.  Try a clear liquid diet (broth, tea, or water) as directed by your health care provider. Slowly move to a bland diet as tolerated. SEEK MEDICAL CARE IF:  You have unexplained abdominal pain.  You have abdominal pain associated with nausea or diarrhea.  You have pain when you urinate or have a bowel movement.  You experience abdominal pain that wakes you in the night.  You have abdominal pain that is worsened or improved by eating food.  You have abdominal pain that is worsened with eating fatty foods.  You have a fever. SEEK IMMEDIATE MEDICAL CARE IF:   Your pain does not go away within 2 hours.  You keep throwing up (vomiting).  Your pain is felt only in portions of the abdomen, such as the right side or the left lower portion of the abdomen.  You pass bloody or black tarry stools. MAKE SURE YOU:  Understand these instructions.   Will watch your condition.   Will get help right away if you are not doing well or get worse.  Document Released: 03/05/2005 Document Revised: 05/31/2013 Document Reviewed: 02/02/2013 [facility] Patient Information 2015 [place], [place]. This information is not intended to replace advice given to you by your health care provider. Make sure you discuss any questions you have with your health care provider.

## 2014-09-06 ENCOUNTER — Telehealth: Payer: Self-pay

## 2014-09-06 ENCOUNTER — Other Ambulatory Visit: Payer: Self-pay

## 2014-09-06 LAB — URINE CULTURE
Colony Count: NO GROWTH
Culture: NO GROWTH

## 2014-09-06 MED ORDER — MESALAMINE 4 G RE ENEM: 4.0000 g | ENEMA | Freq: Every day | RECTAL | Status: DC

## 2014-09-06 NOTE — Telephone Encounter (Signed)
I will send in Rowasa enemas. Needs evaluation in 2 weeks, not necessarily urgent unless anything changes.

## 2014-09-06 NOTE — Telephone Encounter (Signed)
Pt's mother is calling to get an appointment. Pt was seen in the ER on 09/04/14 for abd pain. He is being treated for a UTI. She also states that he is having blood in his stools and has loss 30 lbs in 3 weeks. The first appointment we have in at the end of April. Please advise

## 2014-09-06 NOTE — Telephone Encounter (Signed)
Follow up appointment on 09/25/14 @ 11:30

## 2014-09-06 NOTE — Telephone Encounter (Signed)
Pt's mother is aware

## 2014-09-06 NOTE — Telephone Encounter (Signed)
We can do an urgent.

## 2014-09-21 ENCOUNTER — Telehealth: Payer: Self-pay

## 2014-09-21 NOTE — Telephone Encounter (Signed)
Pt's mom called today and said the pt is doing much better. She had Manuela Schwartz cancel his appt for 09/25/2014 ( he was scheduled in an urgent slot). She said he is not having any rectal bleeding and he is gaining his weight back and has gained about 10 lbs.   She is having some problems and wanted to use the urgent slot on 09/25/2014 with Laban Emperor, NP,  at 11:30 PM.  Please see her chart note Carlena Sax  12/17/1972)  Manuela Schwartz and myself told pt that we cannot just fill in the urgent slots with regular appts.

## 2014-09-21 NOTE — Telephone Encounter (Signed)
See addendum to 09/06/2014 note.

## 2014-09-22 ENCOUNTER — Encounter: Payer: Self-pay | Admitting: Gastroenterology

## 2014-09-22 NOTE — Telephone Encounter (Signed)
MADE PATIENT APPOINTMENT AND MAILED LETTER

## 2014-09-22 NOTE — Telephone Encounter (Signed)
Mr. Dues needs an appt in the next few weeks, non-urgent. See note for Carlena Sax ([birth year]).

## 2014-09-22 NOTE — Telephone Encounter (Signed)
Pt's mom is aware that he will be scheduled for a non urgent appt.  Routing to [place] to schedule.

## 2014-09-25 ENCOUNTER — Ambulatory Visit: Payer: Medicaid Other | Admitting: Gastroenterology

## 2014-10-18 ENCOUNTER — Ambulatory Visit (INDEPENDENT_AMBULATORY_CARE_PROVIDER_SITE_OTHER): Payer: Medicaid Other | Admitting: Gastroenterology

## 2014-10-18 ENCOUNTER — Encounter: Payer: Self-pay | Admitting: Gastroenterology

## 2014-10-18 VITALS — BP 143/86 | HR 83 | Temp 98.0°F | Ht 67.0 in | Wt 206.4 lb

## 2014-10-18 DIAGNOSIS — K5190 Ulcerative colitis, unspecified, without complications: Secondary | ICD-10-CM | POA: Diagnosis not present

## 2014-10-18 MED ORDER — MESALAMINE ER 0.375 G PO CP24: 1.5000 g | ORAL_CAPSULE | Freq: Every day | ORAL | Status: DC

## 2014-10-18 NOTE — Progress Notes (Signed)
Referring Provider: Pa, [facility] Primary Care Physician:  [facility]  Primary GI: Dr. [name]   Chief Complaint  Patient presents with  . Follow-up  . Rectal Bleeding    HPI:   Wayne Avila is a 24 y.o. male presenting today with a history of likely UC, diagnosed March 2015, on Apriso. 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 Biopsies could not delineate between Crohn's and ulcerative colitis. [facility] lab test ordered and c/w UC per Dr. Nona Dell note  BM twice per day. No diarrhea. No rectal bleeding. Denies abdominal pain. Continues Apriso.   Past Medical History  Diagnosis Date  . Asthma   . ADHD (attention deficit hyperactivity disorder)   . Bipolar 1 disorder   . GERD (gastroesophageal reflux disease)   . Depression   . Anxiety   . Mental developmental delay 11/15/2012  . PONV (postoperative nausea and vomiting)   . Hearing loss   . HOH (hard of hearing)   . Abdominal pain, chronic, right upper quadrant   . Colitis   . Seizures     Past Surgical History  Procedure Laterality Date  . Tonsillectomy    . Adenoidectomy    . Esophagogastroduodenoscopy (egd) with propofol N/A 12/07/2012    SLF:The mucosa of the esophagus appeared normal Non-erosive gastritis (inflammation) was found in the gastric antrum; multiple biopsies The duodenal mucosa showed no abnormalities in the bulb and second portion of the duodenum  . Esophageal biopsy N/A 12/07/2012    Procedure: GASTRIC BIOPSIES;  Surgeon: [name], MD;  Location: [facility];  Service: Endoscopy;  Laterality: N/A;  . Colonoscopy with propofol N/A 08/30/2013    LKG:MWNUUV mucosa in the terminal ileum/COLITIS/ MILD PROCTITIS. 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  . Esophageal biopsy N/A 08/30/2013    Procedure: BIOPSY;  Surgeon: [name], MD;   Location: [facility];  Service: Endoscopy;  Laterality: N/A;  right colon,transverse colon, left colon, rectal biopsies    Current Outpatient Prescriptions  Medication Sig Dispense Refill  . albuterol (PROAIR HFA) 108 (90 BASE) MCG/ACT inhaler Inhale 2 puffs into the lungs every 4 (four) hours as needed for wheezing. Persistent dry cough 6.7 g 2  . albuterol (PROVENTIL) (2.5 MG/3ML) 0.083% nebulizer solution Take 2.5 mg by nebulization every 6 (six) hours as needed. For shortness of breath    . atomoxetine (STRATTERA) 100 MG capsule Take 100 mg by mouth every morning.    . divalproex (DEPAKOTE) 500 MG DR tablet Take 1 tablet (500 mg total) by mouth 2 (two) times daily. 60 tablet 12  . loratadine (CLARITIN) 10 MG tablet Take 10 mg by mouth at bedtime.    . mesalamine (APRISO) 0.375 G 24 hr capsule Take 4 capsules (1.5 g total) by mouth daily. 124 capsule 11  . methylphenidate (CONCERTA) 54 MG CR tablet Take 54 mg by mouth every morning.    . montelukast (SINGULAIR) 10 MG tablet Take 1 tablet (10 mg total) by mouth at bedtime. 30 tablet 3  . ranitidine (ZANTAC) 150 MG tablet Take 150 mg by mouth daily.     . sertraline (ZOLOFT) 100 MG tablet Take 100 mg by mouth daily.  1  . traZODone (DESYREL) 100 MG tablet Take 200 mg by mouth at bedtime.   2  . acetaminophen-codeine (TYLENOL #3) 300-30 MG per tablet Take 1-2 tablets by  mouth every 6 (six) hours as needed for moderate pain. (Patient not taking: Reported on 10/18/2014) 15 tablet 0  . cephALEXin (KEFLEX) 500 MG capsule Take 1 capsule (500 mg total) by mouth 4 (four) times daily. (Patient not taking: Reported on 10/18/2014) 40 capsule 0   No current facility-administered medications for this visit.    Allergies as of 10/18/2014 - Review Complete 10/18/2014  Allergen Reaction Noted  . Amoxicillin-pot clavulanate Nausea And Vomiting 02/28/2011  . Omeprazole Nausea And Vomiting 04/20/2013  . Pineapple Swelling 04/14/2011  . Strawberry Swelling  05/08/2012  . Tomato Rash 04/14/2011    Family History  Problem Relation Age of Onset  . Asthma Mother   . Ulcers Mother   . Bipolar disorder Mother   . Colon cancer Neg Hx   . Liver disease Neg Hx   . ADD / ADHD Father   . Diabetes Maternal Grandmother   . Diabetes Maternal Grandfather     History   Social History  . Marital Status: Single    Spouse Name: N/A  . Number of Children: 0  . Years of Education: 12th   Occupational History  .      not working   Social History Main Topics  . Smoking status: Former Smoker -- 1.00 packs/day for 2 years    Types: Cigarettes    Quit date: 10/06/2012  . Smokeless tobacco: Never Used  . Alcohol Use: No  . Drug Use: No  . Sexual Activity: Yes   Other Topics Concern  . None   Social History Narrative   Patient lives at home with girlfriend and family.   Caffeine Use: occasionally    Review of Systems: As mentioned in HPI  Physical Exam: BP 143/86 mmHg  Pulse 83  Temp(Src) 98 F (36.7 C)  Ht 5' 7"  (1.702 m)  Wt 206 lb 6.4 oz (93.622 kg)  BMI 32.32 kg/m2 General:   Alert and oriented. No distress noted. Pleasant and cooperative.  Head:  Normocephalic and atraumatic. Abdomen:  +BS, soft, non-tender and non-distended. No rebound or guarding. No HSM or masses noted. Msk:  Symmetrical without gross deformities. Normal posture. Extremities:  Without edema. Neurologic:  Alert and  oriented x4;  grossly normal neurologically. Psych:  Alert and cooperative. Normal mood and affect.  Lab Results  Component Value Date   WBC 6.5 09/05/2014   HGB 13.6 09/05/2014   HCT 39.0 09/05/2014   MCV 83.2 09/05/2014   PLT 198 09/05/2014   Lab Results  Component Value Date   ALT 12 09/05/2014   AST 20 09/05/2014   ALKPHOS 51 09/05/2014   BILITOT 0.3 09/05/2014   Lab Results  Component Value Date   CREATININE 0.90 09/05/2014   BUN 12 09/05/2014   NA 136 09/05/2014   K 3.8 09/05/2014   CL 105 09/05/2014   CO2 23 09/05/2014

## 2014-10-18 NOTE — Assessment & Plan Note (Signed)
24 year old male with UC, doing well on Apriso 4 capsules daily. No concerning lower or upper GI signs. Will have patient return in 6 months or sooner if needed.

## 2014-10-18 NOTE — Patient Instructions (Signed)
Continue Apriso 4 capsules per day. I have sent refills to your pharmacy.    We will see you in 6 months! Keep up the good work!

## 2014-10-27 ENCOUNTER — Emergency Department (HOSPITAL_COMMUNITY): Payer: Medicaid Other

## 2014-10-27 ENCOUNTER — Encounter (HOSPITAL_COMMUNITY): Payer: Self-pay | Admitting: Emergency Medicine

## 2014-10-27 ENCOUNTER — Emergency Department (HOSPITAL_COMMUNITY)
Admission: EM | Admit: 2014-10-27 | Discharge: 2014-10-27 | Disposition: A | Discharge status: Home / Self Care | Payer: Medicaid Other | Attending: Emergency Medicine | Admitting: Emergency Medicine

## 2014-10-27 DIAGNOSIS — Y9289 Other specified places as the place of occurrence of the external cause: Secondary | ICD-10-CM | POA: Insufficient documentation

## 2014-10-27 DIAGNOSIS — F319 Bipolar disorder, unspecified: Secondary | ICD-10-CM | POA: Insufficient documentation

## 2014-10-27 DIAGNOSIS — J45909 Unspecified asthma, uncomplicated: Secondary | ICD-10-CM | POA: Diagnosis not present

## 2014-10-27 DIAGNOSIS — F419 Anxiety disorder, unspecified: Secondary | ICD-10-CM | POA: Diagnosis not present

## 2014-10-27 DIAGNOSIS — W500XXA Accidental hit or strike by another person, initial encounter: Secondary | ICD-10-CM | POA: Diagnosis not present

## 2014-10-27 DIAGNOSIS — Z87891 Personal history of nicotine dependence: Secondary | ICD-10-CM | POA: Insufficient documentation

## 2014-10-27 DIAGNOSIS — F909 Attention-deficit hyperactivity disorder, unspecified type: Secondary | ICD-10-CM | POA: Diagnosis not present

## 2014-10-27 DIAGNOSIS — K219 Gastro-esophageal reflux disease without esophagitis: Secondary | ICD-10-CM | POA: Diagnosis not present

## 2014-10-27 DIAGNOSIS — S0083XA Contusion of other part of head, initial encounter: Secondary | ICD-10-CM | POA: Diagnosis not present

## 2014-10-27 DIAGNOSIS — G8929 Other chronic pain: Secondary | ICD-10-CM | POA: Diagnosis not present

## 2014-10-27 DIAGNOSIS — R569 Unspecified convulsions: Secondary | ICD-10-CM | POA: Insufficient documentation

## 2014-10-27 DIAGNOSIS — Y998 Other external cause status: Secondary | ICD-10-CM | POA: Insufficient documentation

## 2014-10-27 DIAGNOSIS — Z792 Long term (current) use of antibiotics: Secondary | ICD-10-CM | POA: Diagnosis not present

## 2014-10-27 DIAGNOSIS — S0591XA Unspecified injury of right eye and orbit, initial encounter: Secondary | ICD-10-CM | POA: Diagnosis present

## 2014-10-27 DIAGNOSIS — Y9389 Activity, other specified: Secondary | ICD-10-CM | POA: Diagnosis not present

## 2014-10-27 DIAGNOSIS — H9190 Unspecified hearing loss, unspecified ear: Secondary | ICD-10-CM | POA: Diagnosis not present

## 2014-10-27 DIAGNOSIS — Z880 Allergy status to penicillin: Secondary | ICD-10-CM | POA: Insufficient documentation

## 2014-10-27 MED ORDER — IBUPROFEN 800 MG PO TABS: 800.0000 mg | ORAL_TABLET | Freq: Three times a day (TID) | ORAL | Status: DC

## 2014-10-27 MED ORDER — IBUPROFEN 800 MG PO TABS
800.0000 mg | ORAL_TABLET | Freq: Once | ORAL | Status: AC
  Administered 2014-10-27: 800 mg via ORAL
  Filled 2014-10-27: qty 1

## 2014-10-27 MED ORDER — ACETAMINOPHEN 500 MG PO TABS
500.0000 mg | ORAL_TABLET | Freq: Once | ORAL | Status: AC
  Administered 2014-10-27: 500 mg via ORAL
  Filled 2014-10-27: qty 1
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
## 2014-10-27 NOTE — Discharge Instructions (Signed)
Your CT scan is negative for any fracture or dislocation of any of your facial bones. Please use an ice pack tonight and tomorrow. Please use ibuprofen 800 mg, along with 500 mg of Tylenol 3 times daily for pain and soreness. Facial or Scalp Contusion A facial or scalp contusion is a deep bruise on the face or head. Injuries to the face and head generally cause a lot of swelling, especially around the eyes. Contusions are the result of an injury that caused bleeding under the skin. The contusion may turn blue, purple, or yellow. Minor injuries will give you a painless contusion, but more severe contusions may stay painful and swollen for a few weeks.  CAUSES  A facial or scalp contusion is caused by a blunt injury or trauma to the face or head area.  SIGNS AND SYMPTOMS   Swelling of the injured area.   Discoloration of the injured area.   Tenderness, soreness, or pain in the injured area.  DIAGNOSIS  The diagnosis can be made by taking a medical history and doing a physical exam. An X-ray exam, CT scan, or MRI may be needed to determine if there are any associated injuries, such as broken bones (fractures). TREATMENT  Often, the best treatment for a facial or scalp contusion is applying cold compresses to the injured area. Over-the-counter medicines may also be recommended for pain control.  HOME CARE INSTRUCTIONS   Only take over-the-counter or prescription medicines as directed by your health care provider.   Apply ice to the injured area.   Put ice in a plastic bag.   Place a towel between your skin and the bag.   Leave the ice on for 20 minutes, 2-3 times a day.  SEEK MEDICAL CARE IF:  You have bite problems.   You have pain with chewing.   You are concerned about facial defects. SEEK IMMEDIATE MEDICAL CARE IF:  You have severe pain or a headache that is not relieved by medicine.   You have unusual sleepiness, confusion, or personality changes.   You throw up  (vomit).   You have a persistent nosebleed.   You have double vision or blurred vision.   You have fluid drainage from your nose or ear.   You have difficulty walking or using your arms or legs.  MAKE SURE YOU:   Understand these instructions.  Will watch your condition.  Will get help right away if you are not doing well or get worse. Document Released: 07/03/2004 Document Revised: 03/16/2013 Document Reviewed: 01/06/2013 [facility] Patient Information 2015 [place], [place]. This information is not intended to replace advice given to you by your health care provider. Make sure you discuss any questions you have with your health care provider.

## 2014-11-07 ENCOUNTER — Encounter (HOSPITAL_COMMUNITY): Payer: Self-pay | Admitting: Emergency Medicine

## 2014-11-07 ENCOUNTER — Emergency Department (HOSPITAL_COMMUNITY)
Admission: EM | Admit: 2014-11-07 | Discharge: 2014-11-09 | Disposition: A | Discharge status: Other Acute Inpatient Hospital | Payer: Medicaid Other | Attending: Emergency Medicine | Admitting: Emergency Medicine

## 2014-11-07 DIAGNOSIS — H9190 Unspecified hearing loss, unspecified ear: Secondary | ICD-10-CM | POA: Insufficient documentation

## 2014-11-07 DIAGNOSIS — F1110 Opioid abuse, uncomplicated: Secondary | ICD-10-CM | POA: Insufficient documentation

## 2014-11-07 DIAGNOSIS — J45909 Unspecified asthma, uncomplicated: Secondary | ICD-10-CM | POA: Insufficient documentation

## 2014-11-07 DIAGNOSIS — Z008 Encounter for other general examination: Secondary | ICD-10-CM | POA: Diagnosis present

## 2014-11-07 DIAGNOSIS — R45851 Suicidal ideations: Secondary | ICD-10-CM

## 2014-11-07 DIAGNOSIS — Z79899 Other long term (current) drug therapy: Secondary | ICD-10-CM | POA: Insufficient documentation

## 2014-11-07 DIAGNOSIS — F32A Depression, unspecified: Secondary | ICD-10-CM

## 2014-11-07 DIAGNOSIS — Z9114 Patient's other noncompliance with medication regimen: Secondary | ICD-10-CM | POA: Diagnosis not present

## 2014-11-07 DIAGNOSIS — G8929 Other chronic pain: Secondary | ICD-10-CM | POA: Diagnosis not present

## 2014-11-07 DIAGNOSIS — Z87891 Personal history of nicotine dependence: Secondary | ICD-10-CM | POA: Diagnosis not present

## 2014-11-07 DIAGNOSIS — Z8719 Personal history of other diseases of the digestive system: Secondary | ICD-10-CM | POA: Diagnosis not present

## 2014-11-07 DIAGNOSIS — F331 Major depressive disorder, recurrent, moderate: Secondary | ICD-10-CM | POA: Diagnosis not present

## 2014-11-07 DIAGNOSIS — F419 Anxiety disorder, unspecified: Secondary | ICD-10-CM | POA: Diagnosis not present

## 2014-11-07 DIAGNOSIS — F329 Major depressive disorder, single episode, unspecified: Secondary | ICD-10-CM | POA: Diagnosis not present

## 2014-11-07 LAB — CBC
HCT: 40.5 % (ref 39.0–52.0)
Hemoglobin: 13.8 g/dL (ref 13.0–17.0)
MCH: 28.6 pg (ref 26.0–34.0)
MCHC: 34.1 g/dL (ref 30.0–36.0)
MCV: 84 fL (ref 78.0–100.0)
Platelets: 192 10*3/uL (ref 150–400)
RBC: 4.82 MIL/uL (ref 4.22–5.81)
RDW: 12.5 % (ref 11.5–15.5)
WBC: 6 10*3/uL (ref 4.0–10.5)

## 2014-11-07 LAB — COMPREHENSIVE METABOLIC PANEL
ALT: 11 U/L — ABNORMAL LOW (ref 17–63)
AST: 16 U/L (ref 15–41)
Albumin: 3.8 g/dL (ref 3.5–5.0)
Alkaline Phosphatase: 47 U/L (ref 38–126)
Anion gap: 6 (ref 5–15)
BUN: 13 mg/dL (ref 6–20)
CO2: 26 mmol/L (ref 22–32)
Calcium: 9.1 mg/dL (ref 8.9–10.3)
Chloride: 105 mmol/L (ref 101–111)
Creatinine, Ser: 1.02 mg/dL (ref 0.61–1.24)
GFR calc Af Amer: >60 mL/min (ref >60)
GFR calc non Af Amer: >60 mL/min (ref >60)
Glucose, Bld: 93 mg/dL (ref 65–99)
Potassium: 3.9 mmol/L (ref 3.5–5.1)
Sodium: 137 mmol/L (ref 135–145)
Total Bilirubin: 0.4 mg/dL (ref 0.3–1.2)
Total Protein: 6.6 g/dL (ref 6.5–8.1)

## 2014-11-07 LAB — RAPID URINE DRUG SCREEN, HOSP PERFORMED
Amphetamines: NOT DETECTED
Barbiturates: NOT DETECTED
Benzodiazepines: NOT DETECTED
Cocaine: NOT DETECTED
Opiates: POSITIVE — AB
Tetrahydrocannabinol: NOT DETECTED

## 2014-11-07 LAB — ETHANOL: Alcohol, Ethyl (B): <5 mg/dL (ref <5)

## 2014-11-07 LAB — VALPROIC ACID LEVEL: Valproic Acid Lvl: 67 ug/mL (ref 50.0–100.0)

## 2014-11-07 LAB — SALICYLATE LEVEL: Salicylate Lvl: <4 mg/dL (ref 2.8–30.0)

## 2014-11-07 MED ORDER — TRAZODONE HCL 50 MG PO TABS
200.0000 mg | ORAL_TABLET | Freq: Every day | ORAL | Status: DC
  Administered 2014-11-07 – 2014-11-08 (×2): 200 mg via ORAL
  Filled 2014-11-07 (×2): qty 4

## 2014-11-07 MED ORDER — METHYLPHENIDATE HCL ER (OSM) 36 MG PO TBCR: 54.0000 mg | EXTENDED_RELEASE_TABLET | ORAL | Status: DC

## 2014-11-07 MED ORDER — MESALAMINE ER 0.375 G PO CP24
1.5000 g | ORAL_CAPSULE | Freq: Every day | ORAL | Status: DC
  Administered 2014-11-09: 1.5 g via ORAL
  Filled 2014-11-07 (×2): qty 4

## 2014-11-07 MED ORDER — MONTELUKAST SODIUM 10 MG PO TABS
10.0000 mg | ORAL_TABLET | Freq: Every day | ORAL | Status: DC
Start: 2014-11-07 — End: 2014-11-09
  Administered 2014-11-07 – 2014-11-08 (×2): 10 mg via ORAL
  Filled 2014-11-07 (×2): qty 1

## 2014-11-07 MED ORDER — ALBUTEROL SULFATE HFA 108 (90 BASE) MCG/ACT IN AERS: 2.0000 | INHALATION_SPRAY | RESPIRATORY_TRACT | Status: DC | PRN

## 2014-11-07 MED ORDER — DIVALPROEX SODIUM 250 MG PO DR TAB
500.0000 mg | DELAYED_RELEASE_TABLET | Freq: Two times a day (BID) | ORAL | Status: DC
  Administered 2014-11-07 – 2014-11-09 (×4): 500 mg via ORAL
  Filled 2014-11-07 (×4): qty 2

## 2014-11-07 MED ORDER — SERTRALINE HCL 50 MG PO TABS
100.0000 mg | ORAL_TABLET | Freq: Every day | ORAL | Status: DC
  Administered 2014-11-07 – 2014-11-09 (×3): 100 mg via ORAL
  Filled 2014-11-07 (×3): qty 2

## 2014-11-07 MED ORDER — ATOMOXETINE HCL 40 MG PO CAPS
100.0000 mg | ORAL_CAPSULE | Freq: Every morning | ORAL | Status: DC
  Administered 2014-11-09: 100 mg via ORAL

## 2014-11-07 NOTE — ED Notes (Signed)
RN spoke with patient mother, Carlena Sax, [phone], states she has been to [employer] office and filed IVC paperwork. Mother would like to speak with [facility] upon TTS.

## 2014-11-07 NOTE — ED Notes (Signed)
Pt states he is under a lot of stress with his family. He does not want to live with his mother.

## 2014-11-07 NOTE — Progress Notes (Addendum)
Patient to be referred for IP treatment to: [facility] - left voicemail to return call. [facility] - left voicemail. [facility] - per Nunzio Cory, fax referral. [facility] - per Fraser Din, no beds, fax for review. [facility] - went to voicemail. [facility] - per [facility], fax referral for tomorrow screening. [facility] - per Caryl Pina, fax referral, 1 male bed open. Per [facility], out of network for Medicaid. [facility] - per Danae Chen, full tonight but fax for review.  At capacity: [facility] - per Joycelyn Schmid. [facility] - per intake. [facility] - no beds for adults, but 1 kid bed open. [facility] - per Lavell Luster, may have 1 male adult. [facility] - per [facility]. [facility] - per Leroy Sea. [facility] - per Karna Christmas.  CSW will continue to seek placement.  Verlon Setting, [place] Disposition staff 11/07/2014 6:59 PM
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
## 2014-11-07 NOTE — BH Assessment (Signed)
Reviewed ED notes prior to initiating assessment. Pt with hx of bipolar and ADHD has increasing depression and SI for the past week. Reports ongoing stressors and lack of support. EDP suspects pt will need inpt treatment.   Requested cart be placed with pt for assessment and IVC be faxed for review.   Assessment to commence shortly.     Lear Ng, [facility] Triage Specialist 11/07/2014 7:22 PM
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
## 2014-11-07 NOTE — ED Notes (Signed)
Pt states he has thoughts of hurting himself, denies HI, does not have a plan. IVC in process per officer

## 2014-11-08 DIAGNOSIS — F331 Major depressive disorder, recurrent, moderate: Secondary | ICD-10-CM

## 2014-11-08 DIAGNOSIS — Z9114 Patient's other noncompliance with medication regimen: Secondary | ICD-10-CM | POA: Diagnosis not present

## 2014-11-08 DIAGNOSIS — R45851 Suicidal ideations: Secondary | ICD-10-CM | POA: Diagnosis not present

## 2014-11-08 NOTE — ED Notes (Signed)
Pharmacy made nursing aware at this time that hospital doesn't provide concerta, apriso and strattera medication. Mother of pt called at this time and stated she could bring his medication to ED around Lunch time today.

## 2014-11-08 NOTE — Progress Notes (Signed)
Patient on the waitlist at [facility], per [facility].

## 2014-11-08 NOTE — ED Notes (Signed)
Pt has been sleeping most of the day. Pt ate dinner and is now requesting a shower. Security called to escort pt to the shower.

## 2014-11-08 NOTE — ED Notes (Signed)
Pt in bed. Eyes closed. No sign of distress at this time.
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
## 2014-11-08 NOTE — Progress Notes (Signed)
CSW gathered collateral information re: pt's history and diagnoses.   Contacted mother at [phone] and was told pt has no hx of inpatient treatment, she states pt experienced SI in 2012 and began therapy with [facility] at that time. States pt has no MR/IDD dx that she knows of. States pt was in special education classes in school for ADHD and being hard of hearing. "Had a hearing aid at one point but has not used it in a long time." Mother made aware that inpatient placement is being pursued as recommended per psychiatry.   Spoke with [employer] representative who states pt does not have care coordination because this would be his first crisis episode on file. States his Medicaid is managed by [employer] and dx on file are bipolar and ADHD. States his clinical home is [facility].  Spoke with Evelena Peat, pt's therapist at [facility] [phone]). She states pt has been receiving therapy and medication management at [facility] since 2012. She states he has no hx of inpatient psychiatric treatment. States he presented to [facility] yesterday with his mother. States mother showed them video of "patient at a bridge saying he was going to jump off. In the 4 years he has been in therapy with [place] I have never seen him like this. He has had a lot of stressors recently- got married a few months ago and is already separated and pursuing divorce, but apparently they are expecting a baby as well. Also was diagnosed with colitis last year and was in the hospital for that. He has financial problems and relies on his mother because of that, but their relationship is a huge stressor as well." She states pt does have difficulty hearing but "responds well when people speak very clearly and directly toward him." She states his dx are bipolar d/o and ADHD. States she was made aware that he was in special education classes in school due to learning disabilities in reading and math, but reports he has no IDD/MR  diagnosis that [facility] is aware of and "has not demonstrated comprehension problems with [place] in the 4 years we've known him." States he has hx of stressful interpersonal relationships and therapy has focused on that as well as "adjusting to the limitations of his life right now financially and with his employment." Evelena Peat asked that CSW follow up with [facility] as needed.  Spoke with Heloise Purpura, NP, regarding contents of above phone calls. Seeking inpatient placement at this time.   Sharren Bridge, MSW, [place] Clinical Social Work, Disposition  11/08/2014 [phone]

## 2014-11-08 NOTE — Progress Notes (Addendum)
CSW seeking inpatient placement for patient. (As collateral gathered today indicates pt is not diagnosed with MR/IDD as was originally indicated, MR/DD treatment facilities are no longer being pursued)  Faxed referral to: [facility]- per [employer]- per [facility]- "cannot say whether we have open beds but fax and practitioner on call will review" [place]- per [facility]- per Delrae Rend, fax [facility] "does have contract for [employer]")  At capacity: [place]- per Janett Billow [facility] per [facility]- per [facility]- per Lydia Guiles- per Starla Link- per Mare Ferrari- per Pamala Hurry- male adult and gero beds only today  Left voicemail: [facility] K[facility] (per Beau Fanny) and Crist[facility] Ford (per Bella Kennedy) cannot accept referral as they cannot take adult Medicaid.   Sharren Bridge, MSW, [place] Clinical Social Work, Disposition  11/08/2014 [phone]

## 2014-11-08 NOTE — ED Notes (Signed)
Awaiting meds from pharmacy for admin.

## 2014-11-09 ENCOUNTER — Encounter (HOSPITAL_COMMUNITY): Payer: Self-pay | Admitting: *Deleted

## 2014-11-09 ENCOUNTER — Inpatient Hospital Stay (HOSPITAL_COMMUNITY)
Admission: AD | Admit: 2014-11-09 | Discharge: 2014-11-13 | DRG: 885 | Disposition: A | Discharge status: Home / Self Care | Payer: Medicaid Other | Source: Intra-hospital | Attending: Psychiatry | Admitting: Psychiatry

## 2014-11-09 DIAGNOSIS — H9190 Unspecified hearing loss, unspecified ear: Secondary | ICD-10-CM | POA: Diagnosis present

## 2014-11-09 DIAGNOSIS — Z818 Family history of other mental and behavioral disorders: Secondary | ICD-10-CM

## 2014-11-09 DIAGNOSIS — Z87891 Personal history of nicotine dependence: Secondary | ICD-10-CM | POA: Diagnosis not present

## 2014-11-09 DIAGNOSIS — F319 Bipolar disorder, unspecified: Secondary | ICD-10-CM | POA: Diagnosis present

## 2014-11-09 DIAGNOSIS — F3130 Bipolar disorder, current episode depressed, mild or moderate severity, unspecified: Secondary | ICD-10-CM | POA: Diagnosis not present

## 2014-11-09 DIAGNOSIS — F3112 Bipolar disorder, current episode manic without psychotic features, moderate: Secondary | ICD-10-CM | POA: Diagnosis not present

## 2014-11-09 DIAGNOSIS — R45851 Suicidal ideations: Secondary | ICD-10-CM | POA: Diagnosis present

## 2014-11-09 DIAGNOSIS — F329 Major depressive disorder, single episode, unspecified: Secondary | ICD-10-CM | POA: Diagnosis not present

## 2014-11-09 HISTORY — DX: Bipolar disorder, current episode manic without psychotic features, moderate: F31.12

## 2014-11-09 MED ORDER — FAMOTIDINE 10 MG PO TABS
10.0000 mg | ORAL_TABLET | Freq: Every day | ORAL | Status: DC
  Administered 2014-11-10 – 2014-11-13 (×4): 10 mg via ORAL
  Filled 2014-11-09 (×6): qty 1

## 2014-11-09 MED ORDER — ALUM & MAG HYDROXIDE-SIMETH 200-200-20 MG/5ML PO SUSP: 30.0000 mL | ORAL | Status: DC | PRN

## 2014-11-09 MED ORDER — TRAZODONE HCL 100 MG PO TABS
100.0000 mg | ORAL_TABLET | Freq: Every evening | ORAL | Status: DC | PRN
  Administered 2014-11-09 – 2014-11-12 (×4): 100 mg via ORAL
  Filled 2014-11-09: qty 1
  Filled 2014-11-09: qty 6
  Filled 2014-11-09 (×3): qty 1

## 2014-11-09 MED ORDER — ALBUTEROL SULFATE (2.5 MG/3ML) 0.083% IN NEBU: 2.5000 mg | INHALATION_SOLUTION | Freq: Four times a day (QID) | RESPIRATORY_TRACT | Status: DC | PRN

## 2014-11-09 MED ORDER — SERTRALINE HCL 100 MG PO TABS
100.0000 mg | ORAL_TABLET | Freq: Every day | ORAL | Status: DC
Start: 2014-11-10 — End: 2014-11-13
  Administered 2014-11-10 – 2014-11-13 (×4): 100 mg via ORAL
  Filled 2014-11-09 (×5): qty 1
  Filled 2014-11-09: qty 3

## 2014-11-09 MED ORDER — ALBUTEROL SULFATE HFA 108 (90 BASE) MCG/ACT IN AERS: 2.0000 | INHALATION_SPRAY | RESPIRATORY_TRACT | Status: DC | PRN

## 2014-11-09 MED ORDER — ATOMOXETINE HCL 60 MG PO CAPS
100.0000 mg | ORAL_CAPSULE | Freq: Every morning | ORAL | Status: DC
  Administered 2014-11-10 – 2014-11-13 (×4): 100 mg via ORAL
  Filled 2014-11-09 (×8): qty 1

## 2014-11-09 MED ORDER — ACETAMINOPHEN 325 MG PO TABS: 650.0000 mg | ORAL_TABLET | Freq: Four times a day (QID) | ORAL | Status: DC | PRN

## 2014-11-09 MED ORDER — DIVALPROEX SODIUM 500 MG PO DR TAB
500.0000 mg | DELAYED_RELEASE_TABLET | Freq: Two times a day (BID) | ORAL | Status: DC
  Administered 2014-11-09 – 2014-11-13 (×8): 500 mg via ORAL
  Filled 2014-11-09 (×8): qty 1
  Filled 2014-11-09: qty 6
  Filled 2014-11-09 (×2): qty 1
  Filled 2014-11-09: qty 6
  Filled 2014-11-09: qty 1

## 2014-11-09 MED ORDER — HYDROXYZINE HCL 25 MG PO TABS
25.0000 mg | ORAL_TABLET | Freq: Three times a day (TID) | ORAL | Status: DC | PRN
  Administered 2014-11-09: 25 mg via ORAL
  Filled 2014-11-09: qty 1

## 2014-11-09 MED ORDER — MONTELUKAST SODIUM 10 MG PO TABS
10.0000 mg | ORAL_TABLET | Freq: Every day | ORAL | Status: DC
  Administered 2014-11-09 – 2014-11-12 (×4): 10 mg via ORAL
  Filled 2014-11-09 (×7): qty 1

## 2014-11-09 MED ORDER — MAGNESIUM HYDROXIDE 400 MG/5ML PO SUSP: 30.0000 mL | Freq: Every day | ORAL | Status: DC | PRN

## 2014-11-09 MED ORDER — METHYLPHENIDATE HCL ER (OSM) 36 MG PO TBCR
54.0000 mg | EXTENDED_RELEASE_TABLET | ORAL | Status: DC
  Administered 2014-11-10: 54 mg via ORAL
  Filled 2014-11-09: qty 1

## 2014-11-09 MED ORDER — MESALAMINE ER 0.375 G PO CP24
1.5000 g | ORAL_CAPSULE | Freq: Every day | ORAL | Status: DC
  Filled 2014-11-09 (×2): qty 4

## 2014-11-09 MED ORDER — FLUTICASONE PROPIONATE 50 MCG/ACT NA SUSP
1.0000 | Freq: Every day | NASAL | Status: DC
  Administered 2014-11-10 – 2014-11-13 (×4): 1 via NASAL
  Filled 2014-11-09: qty 16

## 2014-11-09 NOTE — ED Notes (Signed)
Pt given meal tray but is refusing to eat. Nurse made pt aware she has medication for him to take but patient continues to ignore the nurse. Pt is breathing and moving within the bed. However, pt is ignoring the nurse.

## 2014-11-09 NOTE — ED Notes (Signed)
Unable to give give meds. Not in pyxis. Message sent to pharm

## 2014-11-09 NOTE — Progress Notes (Signed)
Psychoeducational Group Note  Date:  11/09/2014 Time:  2219  Group Topic/Focus:  Wrap-Up Group:   The focus of this group is to help patients review their daily goal of treatment and discuss progress on daily workbooks.  Participation Level: Did Not Attend  Participation Quality:  Not Applicable  Affect:  Not Applicable  Cognitive:  Not Applicable  Insight:  Not Applicable  Engagement in Group: Not Applicable  Additional Comments:  The patient did not attend group this evening since he had yet to be admitted to the unit.   Archie Balboa S 11/09/2014, 10:19 PM

## 2014-11-09 NOTE — Progress Notes (Signed)
24 year old male pt admitted on involuntary basis. On admission, pt does report that he was sitting on a bridge and was crying and was thinking about jumping off. Pt reports that he has been having a lot of stress with his mother and reports that she does not do anything for him and the only one who does is his grandmother. Pt does report that he does take his medications as prescribed and denies any alcohol or drug use. Pt did endorse that he does have issues with his anger, however. Pt is able to contract for safety on the unit. Pt was oriented and safety maintained.

## 2014-11-09 NOTE — ED Notes (Signed)
Pt awake, states he is waiting to eat so he can go  Back to sleep

## 2014-11-09 NOTE — Tx Team (Signed)
Initial Interdisciplinary Treatment Plan   PATIENT STRESSORS: Marital or family conflict Medication change or noncompliance   PATIENT STRENGTHS: Ability for insight General fund of knowledge Motivation for treatment/growth Supportive family/friends   PROBLEM LIST: Problem List/Patient Goals Date to be addressed Date deferred Reason deferred Estimated date of resolution  "having a lot of stress with my mom" 11/09/14     Depression 11/09/14     Suicidal Ideation 11/09/14                                          DISCHARGE CRITERIA:  Ability to meet basic life and health needs Improved stabilization in mood, thinking, and/or behavior Verbal commitment to aftercare and medication compliance  PRELIMINARY DISCHARGE PLAN: Attend aftercare/continuing care group Return to previous living arrangement  PATIENT/FAMIILY INVOLVEMENT: This treatment plan has been presented to and reviewed with the patient, Wayne Avila, and/or family member, .  The patient and family have been given the opportunity to ask questions and make suggestions.  [place], [place] 11/09/2014, 8:20 PM

## 2014-11-09 NOTE — Progress Notes (Signed)
CSW followed up on placement efforts. (Pt also considered for admission to [facility] upon bed availability)  Faxed referral to: [place]- "possible 1 adult bed, fax just in case" [facility]- per [facility]- "cannot say whether we have open beds but fax referral and it will be reviewed if so"  On waitlist: [facility]- per Ronalee Belts  At capacity: [facility]- per Raylene Everts- per [facility]- per Keenan Bachelor- per Ferd Hibbs- per [facility]- per [facility]- per [place], child 13yo and under beds only [facility]- per Kia  Left voicemails: [place], MSW, [place] Work, Disposition  11/09/2014 [phone]

## 2014-11-09 NOTE — ED Notes (Signed)
Pt up to bathroom. Pt quiet and cooperative

## 2014-11-09 NOTE — Progress Notes (Signed)
Accepted to [facility] bed 404-1 by Dr. Parke Poisson per Randall Hiss, [facility]. Pt under IVC. Can be transported when ready. Report#[phone].  Spoke with [place] charge RN re: pt's placement.  Sharren Bridge, MSW, [place] Clinical Social Work, Disposition  11/09/2014 [phone]

## 2014-11-09 NOTE — ED Provider Notes (Signed)
  Physical Exam  BP 153/97 mmHg  Pulse 103  Temp(Src) 98.4 F (36.9 C) (Oral)  Resp 16  Wt 220 lb (99.791 kg)  SpO2 100%  Physical Exam  ED Course  Procedures  MDM Accepted at [facility] by Dr Parke Poisson. No complaints at this time.      Davonna Belling, MD 11/09/14 1434

## 2014-11-10 DIAGNOSIS — F3130 Bipolar disorder, current episode depressed, mild or moderate severity, unspecified: Secondary | ICD-10-CM

## 2014-11-10 LAB — VALPROIC ACID LEVEL: Valproic Acid Lvl: 84 ug/mL (ref 50.0–100.0)

## 2014-11-10 MED ORDER — METHYLPHENIDATE HCL ER (OSM) 36 MG PO TBCR
36.0000 mg | EXTENDED_RELEASE_TABLET | ORAL | Status: DC
  Administered 2014-11-11 – 2014-11-13 (×3): 36 mg via ORAL
  Filled 2014-11-10 (×3): qty 1

## 2014-11-10 MED ORDER — MESALAMINE 1.2 G PO TBEC
1.2000 g | DELAYED_RELEASE_TABLET | Freq: Every day | ORAL | Status: DC
  Administered 2014-11-10 – 2014-11-13 (×4): 1.2 g via ORAL
  Filled 2014-11-10 (×6): qty 1

## 2014-11-10 NOTE — Progress Notes (Signed)
Recreation Therapy Notes  Date: 06.03.16 Time: 9:30am Location: 300 Group Room  Group Topic: Stress Management  Goal Area(s) Addresses:  Patient will verbalize importance of using healthy stress management.  Patient will identify positive emotions associated with healthy stress management.   Intervention: Stress Management  Activity :  Progressive Muscle Relaxation.  LRT introduced patients to stress management technique of progressive muscle relaxation.  A script was used to deliver the technique and patients were asked to follow script read by LRT to engage in practicing the stress management technique.    Education:  Stress Management, Discharge Planning.   Education Outcome: Acknowledges edcuation/In group clarification offered  Clinical Observations/Feedback: Patient did not attend group.    Victorino Sparrow, LRT/CTRS         Victorino Sparrow A 11/10/2014 1:46 PM

## 2014-11-10 NOTE — BHH Group Notes (Signed)
[facility] LCSW Aftercare Discharge Planning Group Note   11/10/2014 11:37 AM    Participation Quality:  Appropraite  Mood/Affect:  Appropriate  Depression Rating:  0  Anxiety Rating:  0  Thoughts of Suicide:  No  Will you contract for safety?   NA  Current AVH:  No  Plan for Discharge/Comments:  Patient attended discharge planning group and actively participated in group. Patient is a very poor historian. He reports he was living with mother on admission and was being seen outpatient but does not know by whom. Suicide prevention education reviewed and SPE document provided.   Transportation Means: Patient has transportation.   Supports:  Patient has a support system.   Wayne Avila, Eulas Post

## 2014-11-10 NOTE — Progress Notes (Signed)
D: Patient denies SI/HI and A/V hallucinations; patient reports sleep is good; reports appetite is good; reports energy level is good ; reports ability to concentrate is good; rates depression as 0/10; rates hopelessness 0/10; rates anxiety as 0/10; patient reports that he wants to work on his anger  A: Monitored q 15 minutes; patient encouraged to attend groups; patient educated about medications; patient given medications per physician orders; patient encouraged to express feelings and/or concerns  R: Patient is very minimal; patient has been cooperative but can answer a lot of questions with just a nod; patient has been pleasant and can stand in the dayroom a lot; patient was able to set goal to talk with staff 1:1 when having feelings of SI; patient is taking medications as prescribed and tolerating medications

## 2014-11-10 NOTE — Tx Team (Signed)
Interdisciplinary Treatment Plan Update (Adult)  Date:  11/10/2014  Time Reviewed:  2:10 PM   Progress in Treatment: Attending groups: Patient is attending groups. Participating in groups:  Patient engages in discussion Taking medication as prescribed:  Patient is taking medications Tolerating medication:  Patient is tolerating medications Family/Significant othe contact made:   Yes, collateral contact with mother. Patient understands diagnosis:Yes, patient understands diagnosis and need for treatment Discussing patient identified problems/goals with staff:  Yes, patient is able to express goals/problems Medical problems stabilized or resolved:  Yes Denies suicidal/homicidal ideation: Yes, patient is denying SI/HI. Issues/concerns per patient self-inventory:   Other:  Discharge Plan or Barriers:  Discharge home and follow up with [facility] in [place]  Reason for Continuation of Hospitalization: Anxiety Depression Medication stabilization  Comments:  Additional comments:  Patient and CSW reviewed Patient Discharge Process Letter/Patient Involvement Form.  Patient verbalized understanding and signed form.  Patient and CSW also reviewed and identified patient's goals and treatment plan.  Patient verbalized understanding and agreed to plan.  Estimated length of stay:  3-4 days  New goal(s):  Review of initial/current patient goals per problem list:  Please see plan of careInterdisciplinary Treatment Plan Update (Adult)  Attendees: Patient 11/10/2014 2:10 PM   Family:   11/10/2014 2:10 PM   Physician:  Neita Garnet, MD 11/10/2014 2:10 PM   Nursing:   Hester Mates, RN 11/10/2014 2:10 PM   Clinical Social Worker:  Joette Catching, LCSW 11/10/2014 2:10 PM   Clinical Social Worker:  Erasmo Downer Drinkard, LCSW-A 11/10/2014 2:10 PM   Case Manager:  Lars Pinks, RN 11/10/2014 2:10 PM   Other:  Marilynne Halsted, RN 11/10/2014 2:10 PM  Other:   11/10/2014  2:10 PM   Other:  11/10/2014 2:10 PM   Other:   11/10/2014 2:10 PM   Other:  11/10/2014 2:10 PM   Other:  Jake Bathe Transition Team Coordinator 11/10/2014 2:10 PM   Other:   11/10/2014 2:10 PM   Other:  11/10/2014 2:10 PM   Other:   11/10/2014 2:10 PM    Scribe for Treatment Team:   Concha Pyo, 11/10/2014   2:10 PM

## 2014-11-10 NOTE — BHH Suicide Risk Assessment (Signed)
[place] INPATIENT:  Family/Significant Other Suicide Prevention Education  Suicide Prevention Education:  Education Completed; Marygrace Drought, Mother, [phone]; has been identified by the patient as the family member/significant other with whom the patient will be residing, and identified as the person(s) who will aid the patient in the event of a mental health crisis (suicidal ideations/suicide attempt).  With written consent from the patient, the family member/significant other has been provided the following suicide prevention education, prior to the and/or following the discharge of the patient.  The suicide prevention education provided includes the following:  Suicide risk factors  Suicide prevention and interventions  National Suicide Hotline telephone number  [facility] assessment telephone number  [facility] Emergency Assistance [place] and/or Residential Mobile Crisis Unit telephone number  Request made of family/significant other to:  Remove weapons (e.g., guns, rifles, knives), all items previously/currently identified as safety concern.   Mother advised patient does not have access to weapons.     Remove drugs/medications (over-the-counter, prescriptions, illicit drugs), all items previously/currently identified as a safety concern.  The family member/significant other verbalizes understanding of the suicide prevention education information provided.  The family member/significant other agrees to remove the items of safety concern listed above.  Concha Pyo 11/10/2014, 11:42 AM
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
## 2014-11-10 NOTE — BHH Suicide Risk Assessment (Signed)
[facility] Suite Admission Suicide Risk Assessment   Nursing information obtained from:    Demographic factors:   24 year old male, on disability, lives with mother Current Mental Status:   see below Loss Factors:   lack of daily structure, disability, girlfriend pregnant  Historical Factors:   history of Bipolar Disorder, ADHD, learning disability Risk Reduction Factors:   resilience, social support  Total Time spent with patient: 45 minutes Principal Problem: Bipolar Disorder, Depressed  Diagnosis:   Patient Active Problem List   Diagnosis Date Noted  . Bipolar 1 disorder, manic, moderate [F31.12] 11/09/2014  . Suicidal ideation [R45.851]   . Major depressive disorder, recurrent episode, moderate [F33.1]   . Oral candida [B37.0] 02/23/2014  . UC (ulcerative colitis) [K51.90] 10/25/2013  . Abdominal pain, acute [R10.0] 09/20/2013  . Rectal bleeding [K62.5] 08/09/2013  . Unintentional weight loss [R63.4] 08/09/2013  . RUQ pain [R10.11] 08/09/2013  . Unspecified gastritis and gastroduodenitis without mention of hemorrhage [K29.70, K29.90] 04/20/2013  . Upper abdominal pain [R10.10] 12/01/2012  . Nausea with vomiting [R11.2] 12/01/2012  . 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  World Pharmacologist [facility]). Score between 0-7:  no or low risk or alcohol related problems. Score between 8-15:  moderate risk of alcohol related problems. Score between 16-19:  high risk of alcohol related problems. Score 20 or above:  warrants further diagnostic evaluation for alcohol dependence and treatment.   CLINICAL FACTORS:  24 year old male, lives with mother, history of Bipolar Disorder, ADHD and Learning Disability, recent thoughts of suicide by jumping off bridge- reports auditory hallucinations  telling him to do so at the time. Facing stressors, which include anger with mother for not giving him money ( she is his disability payee) , girlfriend being pregnant, and frustration about disability, not being in college. Prior history of trying to hang self a few months ago related to being bullied .   Musculoskeletal: Strength & Muscle Tone: within normal limits Gait & Station: normal Patient leans: N/A  Psychiatric Specialty Exam: Physical Exam  ROS  Blood pressure 135/82, pulse 86, temperature 97.8 F (36.6 C), temperature source Oral, resp. rate 16, height 5' 6"  (1.676 m), weight 195 lb (88.451 kg).Body mass index is 31.49 kg/(m^2).  SEE ADMIT NOTE MSE   COGNITIVE FEATURES THAT CONTRIBUTE TO RISK:  Closed-mindedness    SUICIDE RISK:   Moderate:  Frequent suicidal ideation with limited intensity, and duration, some specificity in terms of plans, no associated intent, good self-control, limited dysphoria/symptomatology, some risk factors present, and identifiable protective factors, including available and accessible social support.  PLAN OF CARE: Patient will be admitted to inpatient psychiatric unit for stabilization and safety. Will provide and encourage milieu participation. Provide medication management and maked adjustments as needed.  Will follow daily.  Patient will be admitted to inpatient psychiatric unit for stabilization and safety. Will provide and encourage milieu participation. Provide medication management and maked adjustments as needed.  Will follow daily.    Medical Decision Making:  Review of Psycho-Social Stressors (1), Review or order clinical lab tests (1), Established Problem, Worsening (2) and Review of Medication Regimen & Side Effects (2)  I certify that inpatient services furnished can reasonably be expected to improve the patient's condition.   COBOS, [place] 11/10/2014, 2:16 PM

## 2014-11-10 NOTE — H&P (Addendum)
Psychiatric Admission Assessment Adult  Patient Identification: Wayne Avila MRN:  [id number] Date of Evaluation:  11/10/2014 Chief Complaint:   " A voice told me to jump off a bridge "   Principal Diagnosis: Bipolar Disorder , Depressed  Diagnosis:   Patient Active Problem List   Diagnosis Date Noted  . Bipolar 1 disorder, manic, moderate [F31.12] 11/09/2014  . Suicidal ideation [R45.851]   . Major depressive disorder, recurrent episode, moderate [F33.1]   . Oral candida [B37.0] 02/23/2014  . UC (ulcerative colitis) [K51.90] 10/25/2013  . Abdominal pain, acute [R10.0] 09/20/2013  . Rectal bleeding [K62.5] 08/09/2013  . Unintentional weight loss [R63.4] 08/09/2013  . RUQ pain [R10.11] 08/09/2013  . Unspecified gastritis and gastroduodenitis without mention of hemorrhage [K29.70, K29.90] 04/20/2013  . Upper abdominal pain [R10.10] 12/01/2012  . Nausea with vomiting [R11.2] 12/01/2012  . Mental developmental delay [R62.50] 11/15/2012   History of Present Illness:: 24 year old single male , lives with mother, aunt, grandmother. Patient is fair historian, and provides only some information.  Collateral information was obtained via phone from mother. Patient consented for me to speak with her. States that on day of admission  Was thinking of jumping off a bridge. He states he was hearing a voice telling him to jump and " another one telling me not to". He states these hallucinations were new experiences for him. States he has been upset recently and facing significant psychosocial stressors. He is on disability, and mother is payee. States that mother often does not give him money when he asks for it. ( mother states that she is reluctant to give him money because he often uses it to buy alcohol) .  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 Another stressor is that his girlfriend is currently pregnant. Regarding alcohol, patient denies abuse, states he drinks occasionally. Patient denies any drug use, and mother corroborates that she does not have suspicion of drug use .  Mother reports that patient had a suicide attempt by hanging a few months ago, apparently triggered by being bullied, made fun of  ( by member of extended family ?).  At this time patient  Minimizes depression and states " I am OK now ". He denies any current psychotic symptoms and does not present internally preoccupied at present. He is hoping to  Be discharged soon. Of note, mother also states that patient has occasional episodes of anger, yelling, and sometimes making verbal threats, although is not physically violent or assaultive. Patient minimizes this and states he does not threaten anyone . Denies any HI. Elements:  Recent  Depression, suicidal ideations, with command hallucinations, in the context of underlying Mood Disorder, severe  psychosocial stressors .  Associated Signs/Symptoms: Depression Symptoms:  At present tends to minimize symptoms of depression- states appetite, energy level, sleep all within normal. Does endorse some anhedonia. (Hypo) Manic Symptoms:  None at present  Anxiety Symptoms: describes anxiety about stressors, does not endorse panic or agoraphobia  Psychotic Symptoms:  As noted, recent onset auditory hallucination, telling him to " jump" off a bridge- he states he has not had hallucinations since admission to hospital. PTSD Symptoms: Denies  Total Time spent with patient: 45 minutes   Past Psychiatric History- patient states that he has been diagnosed with Bipolar Disorder  And  ADHD in the past. He states he has had depression in the past " when people were bullying me".  He describes brief periods of increased energy and less need for sleep, staying up for 2-3 days .  He states he has no  prior history of psychosis. As per mother, has a history of learning disability, .bipolar disorder , ADHD, and intermittent explosive disorder. He is followed by [facility] for therapy and medication management. Patient states he does not know what medications he is on " I am on a lot, I just take what my mother gives me ". Mother states that there are no new medications in his current regimen. As noted, at least one prior suicidal attempt a few months ago by attempting to hang self .   Past Medical History: patient has history of Ulcerative Colitis, but currently in remission. Past Medical History  Diagnosis Date  . Asthma   . ADHD (attention deficit hyperactivity disorder)   . Bipolar 1 disorder   . GERD (gastroesophageal reflux disease)   . Depression   . Anxiety   . Mental developmental delay 11/15/2012  . PONV (postoperative nausea and vomiting)   . Hearing loss   . HOH (hard of hearing)   . Abdominal pain, chronic, right upper quadrant   . Colitis   . Seizures     Past Surgical History  Procedure Laterality Date  . Tonsillectomy    . Adenoidectomy    . Esophagogastroduodenoscopy (egd) with propofol N/A 12/07/2012    SLF:The mucosa of the esophagus appeared normal Non-erosive gastritis (inflammation) was found in the gastric antrum; multiple biopsies The duodenal mucosa showed no abnormalities in the bulb and second portion of the duodenum  . Esophageal biopsy N/A 12/07/2012    Procedure: GASTRIC BIOPSIES;  Surgeon: [name], MD;  Location: [facility];  Service: Endoscopy;  Laterality: N/A;  . Colonoscopy with propofol N/A 08/30/2013    MCE:YEMVVK mucosa in the terminal ileum/COLITIS/ MILD PROCTITIS. 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  . Esophageal biopsy N/A 08/30/2013    Procedure: BIOPSY;  Surgeon: [name], MD;  Location: [facility];  Service: Endoscopy;   Laterality: N/A;  right colon,transverse colon, left colon, rectal biopsies   Family History: no contact with father- states father " abandoned me". Lives with mother, grandmother, aunt . Denies mental illness or suicides in family.  Family History  Problem Relation Age of Onset  . Asthma Mother   . Ulcers Mother   . Bipolar disorder Mother   . Colon cancer Neg Hx   . Liver disease Neg Hx   . ADD / ADHD Father   . Diabetes Maternal Grandmother   . Diabetes Maternal Grandfather    Social History: Single, has girlfriend, who is currently pregnant, on disability, mother is payee. Denies legal issues. High school graduate- history of developmental delays, ADHD, learning disability History  Alcohol Use No    Comment: denies usage     History  Drug Use No    History   Social History  . Marital Status: Single    Spouse Name: N/A  . Number of Children: 0  . Years of Education: 12th   Occupational History  .      not working   Social History Main Topics  . Smoking status: Former Smoker -- 0.00 packs/day for 2 years    Types: Cigarettes  Quit date: 10/06/2012  . Smokeless tobacco: Never Used  . Alcohol Use: No     Comment: denies usage  . Drug Use: No  . Sexual Activity: Yes   Other Topics Concern  . None   Social History Narrative   Patient lives at home with girlfriend and family.   Caffeine Use: occasionally   Additional Social History:  Musculoskeletal: Strength & Muscle Tone: within normal limits Gait & Station: normal Patient leans: N/A  Psychiatric Specialty Exam: Physical Exam  Review of Systems  Constitutional: Negative.   HENT: Negative.   Eyes: Negative.   Respiratory: Negative.   Cardiovascular: Negative.   Gastrointestinal: Negative.   Genitourinary: Negative.   Musculoskeletal: Negative.   Skin: Negative.   Neurological: Negative.   Endo/Heme/Allergies: Negative.   Psychiatric/Behavioral: Positive for depression and suicidal ideas.  all  other systems negative   Blood pressure 135/82, pulse 86, temperature 97.8 F (36.6 C), temperature source Oral, resp. rate 16, height 5' 6"  (1.676 m), weight 195 lb (88.451 kg).Body mass index is 31.49 kg/(m^2).  General Appearance: Fairly Groomed  Engineer, water::  Fair  Speech:  Normal Rate  Volume:  Decreased  Mood:  Depressed- patient presents depressed, but minimizes depression, sadness   Affect:  constricted but reactive   Thought Process:  Goal Directed and Linear  Orientation:  Other:  fully alert and attentive   Thought Content:  denies hallucinations at present and does not appear internally preoccupied , no delusions expressed   Suicidal Thoughts:  No- at present denies any thoughts of hurting self or anyone else ,  Also specifically denies any thoughts of hurting mother or other family members   Homicidal Thoughts:  No  Memory:  Recent and remote grossly intact   Judgement:  Fair  Insight:  Fair  Psychomotor Activity:  Normal- some facial tics noted- patient/mother report these are chronic  Concentration:  Fair  Recall:  Good  Fund of Knowledge:Good  Language: Good  Akathisia:  Negative  Handed:  Right  AIMS (if indicated):     Assets:  Desire for Improvement Physical Health Resilience  ADL's: fair   Cognition:alert , attentive, no evidence of delirium  Sleep:  Number of Hours: 6.75   Risk to Self: Is patient at risk for suicide?: Yes What has been your use of drugs/alcohol within the last 12 months?: Patient denies  Risk to Others:   Prior Inpatient Therapy:   Prior Outpatient Therapy:    Alcohol Screening: 1. How often do you have a drink containing alcohol?: Never 9. Have you or someone else been injured as a result of your drinking?: No 10. Has a relative or friend or a doctor or another health worker been concerned about your drinking or suggested you cut down?: No Alcohol Use Disorder Identification Test Final Score (AUDIT): 0 Brief Intervention: AUDIT score  less than 7 or less-screening does not suggest unhealthy drinking-brief intervention not indicated  Allergies:   Allergies  Allergen Reactions  . Amoxicillin-Pot Clavulanate Nausea And Vomiting  . Omeprazole Nausea And Vomiting  . Pineapple Swelling  . Strawberry Swelling  . Tomato Rash   Lab Results:  Results for orders placed or performed during the hospital encounter of 11/09/14 (from the past 48 hour(s))  Valproic acid level     Status: None   Collection Time: 11/10/14  6:28 AM  Result Value Ref Range   Valproic Acid Lvl 84 50.0 - 100.0 ug/mL    Comment: Performed at [facility]  Current Medications: Current Facility-Administered Medications  Medication Dose Route Frequency Provider Last Rate Last Dose  . acetaminophen (TYLENOL) tablet 650 mg  650 mg Oral Q6H PRN Harriet Butte, NP      . albuterol (PROVENTIL HFA;VENTOLIN HFA) 108 (90 BASE) MCG/ACT inhaler 2 puff  2 puff Inhalation Q4H PRN Harriet Butte, NP      . albuterol (PROVENTIL) (2.5 MG/3ML) 0.083% nebulizer solution 2.5 mg  2.5 mg Nebulization Q6H PRN Harriet Butte, NP      . alum & mag hydroxide-simeth (MAALOX/MYLANTA) 200-200-20 MG/5ML suspension 30 mL  30 mL Oral Q4H PRN Harriet Butte, NP      . atomoxetine (STRATTERA) capsule 100 mg  100 mg Oral q morning - 10a Harriet Butte, NP   100 mg at 11/10/14 1012  . divalproex (DEPAKOTE) DR tablet 500 mg  500 mg Oral BID Harriet Butte, NP   500 mg at 11/10/14 0810  . famotidine (PEPCID) tablet 10 mg  10 mg Oral Daily Harriet Butte, NP   10 mg at 11/10/14 0810  . fluticasone (FLONASE) 50 MCG/ACT nasal spray 1 spray  1 spray Each Nare Daily Harriet Butte, NP   1 spray at 11/10/14 0810  . hydrOXYzine (ATARAX/VISTARIL) tablet 25 mg  25 mg Oral TID PRN Harriet Butte, NP   25 mg at 11/09/14 2133  . magnesium hydroxide (MILK OF MAGNESIA) suspension 30 mL  30 mL Oral Daily PRN Harriet Butte, NP      . mesalamine (LIALDA) EC tablet 1.2 g  1.2 g Oral Q  breakfast Myer Peer Cobos, MD   1.2 g at 11/10/14 1012  . methylphenidate (CONCERTA) CR tablet 54 mg  54 mg Oral BH-q7a Harriet Butte, NP   54 mg at 11/10/14 9811  . montelukast (SINGULAIR) tablet 10 mg  10 mg Oral QHS Harriet Butte, NP   10 mg at 11/09/14 2133  . sertraline (ZOLOFT) tablet 100 mg  100 mg Oral Daily Harriet Butte, NP   100 mg at 11/10/14 0810  . traZODone (DESYREL) tablet 100 mg  100 mg Oral QHS PRN Harriet Butte, NP   100 mg at 11/09/14 2133   PTA Medications: Prescriptions prior to admission  Medication Sig Dispense Refill Last Dose  . acetaminophen-codeine (TYLENOL #3) 300-30 MG per tablet Take 1-2 tablets by mouth every 6 (six) hours as needed for moderate pain. (Patient not taking: Reported on 10/18/2014) 15 tablet 0 Not Taking  . albuterol (PROAIR HFA) 108 (90 BASE) MCG/ACT inhaler Inhale 2 puffs into the lungs every 4 (four) hours as needed for wheezing. Persistent dry cough 6.7 g 2 unknown  . albuterol (PROVENTIL) (2.5 MG/3ML) 0.083% nebulizer solution Take 2.5 mg by nebulization every 6 (six) hours as needed. For shortness of breath   unknown  . atomoxetine (STRATTERA) 100 MG capsule Take 100 mg by mouth every morning.   UNKNOWN  . cephALEXin (KEFLEX) 500 MG capsule Take 1 capsule (500 mg total) by mouth 4 (four) times daily. (Patient not taking: Reported on 10/18/2014) 40 capsule 0 Not Taking  . divalproex (DEPAKOTE) 500 MG DR tablet Take 1 tablet (500 mg total) by mouth 2 (two) times daily. 60 tablet 12 UNKNOWN  . fluticasone (FLONASE) 50 MCG/ACT nasal spray   5 UNKNOWN  . ibuprofen (ADVIL,MOTRIN) 800 MG tablet Take 1 tablet (800 mg total) by mouth 3 (three) times daily. 21 tablet 0 UNKNOWN  . loratadine (CLARITIN) 10  MG tablet Take 10 mg by mouth daily as needed for allergies or itching.    UNKNOWN  . mesalamine (APRISO) 0.375 G 24 hr capsule Take 4 capsules (1.5 g total) by mouth daily. 124 capsule 11 UNKNOWN  . methylphenidate (CONCERTA) 54 MG CR tablet Take  54 mg by mouth every morning.   UNKNOWN  . montelukast (SINGULAIR) 10 MG tablet Take 1 tablet (10 mg total) by mouth at bedtime. 30 tablet 3 UNKNOWN  . ranitidine (ZANTAC) 150 MG tablet Take 150 mg by mouth daily.    Taking  . sertraline (ZOLOFT) 100 MG tablet Take 100 mg by mouth daily.  1 UNKNOWN  . traZODone (DESYREL) 100 MG tablet Take 100-200 mg by mouth at bedtime.   2 UNKNOWN    Previous Psychotropic Medications:  Yes- see above list. Patient states he has been taking medications and denies non-compliance. Therapeutic Valproic Acid serum level  Suggests compliance .   Substance Abuse History in the last 12 months:  Patient denies drug or alcohol abuse, mother reports concerns about alcohol binges , which patient minimizes /denies. UDS positive for opiates , patient denies any opiate abuse.     Consequences of Substance Abuse: Negative   Results for orders placed or performed during the hospital encounter of 11/09/14 (from the past 72 hour(s))  Valproic acid level     Status: None   Collection Time: 11/10/14  6:28 AM  Result Value Ref Range   Valproic Acid Lvl 84 50.0 - 100.0 ug/mL    Comment: Performed at [facility]    Observation Level/Precautions:  15 minute checks  Laboratory:  As needed   Psychotherapy: milieu, support, groups    Medications:  Continue strattera, depakote, zoloft, trazodone- consider tapering concerta down gradually due to abuse potential and possible side effects including  Tics, aggression, psychosis.  Consultations:  If needed   Discharge Concerns:  Psychosocial stressors, disability  Estimated LOS: 6 days   Other:     Psychological Evaluations: No   Treatment Plan Summary: Daily contact with patient to assess and evaluate symptoms and progress in treatment, Medication management, Plan Inpatient psychiatric admission and medication management as above   Medical Decision Making:  Review of Psycho-Social Stressors (1), Review or order  clinical lab tests (1), Established Problem, Worsening (2) and Review of Medication Regimen & Side Effects (2)  I certify that inpatient services furnished can reasonably be expected to improve the patient's condition.   Neita Garnet 6/3/20161:24 PM

## 2014-11-10 NOTE — [facility] Group Notes (Signed)
[facility] LCSW Group Therapy  Feelings Around Relapse 1:15 -2:30        11/10/2014 2:14 PM   Type of Therapy:  Group Therapy  Participation Level:  Appropriate  Participation Quality:  Appropriate  Affect:  Appropriate  Cognitive:  Attentive Appropriate  Insight:  Developing/Improving  Engagement in Therapy: Developing/Improving  Modes of Intervention:  Discussion Exploration Problem-Solving Supportive  Summary of Progress/Problems:  The topic for today was feelings around relapse.   Patient processed feelings toward relapse and was able to relate to peers. Patient shared he would be acting out in anger if he were to relapse.  He advised he is going to be a father soon and has to learn to be a "stand up" guy.  Concha Pyo 11/10/2014   2:14 PM

## 2014-11-11 DIAGNOSIS — R45851 Suicidal ideations: Secondary | ICD-10-CM

## 2014-11-11 NOTE — Progress Notes (Signed)
[facility] Group Notes:  (Nursing/MHT/Case Management/Adjunct)  Date:  11/11/2014  Time:  9:12 PM  Type of Therapy:  Psychoeducational Skills  Participation Level:  Active  Participation Quality:  Appropriate  Affect:  Appropriate  Cognitive:  Appropriate  Insight:  Appropriate  Engagement in Group:  Engaged  Modes of Intervention:  Discussion  Summary of Progress/Problems: Tonight in wrap up group Naji said that today was ok for him, he stated that he is just ready to get back home to his family and friends. He knows that everything is going to work out in his favor so he is ready to get back home and be in his environment. He was not able to think of any coping skills to use.  Jeanette Caprice 11/11/2014, 9:12 PM

## 2014-11-11 NOTE — Progress Notes (Signed)
[facility] MD Progress Note  11/11/2014 3:28 PM HARREL FERRONE  MRN:  [id number] Subjective:  Wayne Avila is a 24 years old young male admitted to [facility] with increased symptoms of depression and suicidal ideation. Patient reportedly talked about suicide by jumping off a bridge secondary to auditory hallucinations. Patient has been suffering with bipolar disorder, attention deficit hyperactive disorder and learning disorder. Patient reported he has a multiple stresses especially related to his of family members, financial difficulties, no job. Patient reportedly lives with his mother, grandmother and aunt. Patient has been compliant with his current medication management and reportedly no adverse affects. Patient denied disturbance of sleep and appetite at this time and contract for safety while in the hospital. Patient has no homicidal ideation or psychotic symptoms during this visit.  Objective: Patient is calm and cooperative during this assessment. Patient endorses symptoms of depression, anxiety, isolation, frustration, helplessness and worthlessness. Patient stated he is on disability but that his mother is his pain he does not give money to him. Patient girlfriend being pregnant, and frustration about disability, not being in college. Prior history of trying to hang self a few months ago related to being bullied.    Principal Problem: Bipolar 1 disorder, manic, moderate Diagnosis:   Patient Active Problem List   Diagnosis Date Noted  . Bipolar 1 disorder, manic, moderate [F31.12] 11/09/2014  . Suicidal ideation [R45.851]   . Major depressive disorder, recurrent episode, moderate [F33.1]   . Oral candida [B37.0] 02/23/2014  . UC (ulcerative colitis) [K51.90] 10/25/2013  . Abdominal pain, acute [R10.0] 09/20/2013  . Rectal bleeding [K62.5] 08/09/2013  . Unintentional weight loss [R63.4] 08/09/2013  . RUQ pain [R10.11] 08/09/2013  . Unspecified gastritis and  gastroduodenitis without mention of hemorrhage [K29.70, K29.90] 04/20/2013  . Upper abdominal pain [R10.10] 12/01/2012  . Nausea with vomiting [R11.2] 12/01/2012  . Mental developmental delay [R62.50] 11/15/2012   Total Time spent with patient: 30 minutes   Past Medical History:  Past Medical History  Diagnosis Date  . Asthma   . ADHD (attention deficit hyperactivity disorder)   . Bipolar 1 disorder   . GERD (gastroesophageal reflux disease)   . Depression   . Anxiety   . Mental developmental delay 11/15/2012  . PONV (postoperative nausea and vomiting)   . Hearing loss   . HOH (hard of hearing)   . Abdominal pain, chronic, right upper quadrant   . Colitis   . Seizures     Past Surgical History  Procedure Laterality Date  . Tonsillectomy    . Adenoidectomy    . Esophagogastroduodenoscopy (egd) with propofol N/A 12/07/2012    SLF:The mucosa of the esophagus appeared normal Non-erosive gastritis (inflammation) was found in the gastric antrum; multiple biopsies The duodenal mucosa showed no abnormalities in the bulb and second portion of the duodenum  . Esophageal biopsy N/A 12/07/2012    Procedure: GASTRIC BIOPSIES;  Surgeon: [name], MD;  Location: [facility];  Service: Endoscopy;  Laterality: N/A;  . Colonoscopy with propofol N/A 08/30/2013    OJJ:KKXFGH mucosa in the terminal ileum/COLITIS/ MILD PROCTITIS. 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  . Esophageal biopsy N/A 08/30/2013    Procedure: BIOPSY;  Surgeon: [name], MD;  Location: [facility];  Service: Endoscopy;  Laterality: N/A;  right colon,transverse colon, left colon, rectal biopsies   Family History:  Family History  Problem  Relation Age of Onset  . Asthma Mother   . Ulcers Mother   . Bipolar disorder Mother   . Colon cancer Neg Hx   . Liver disease Neg Hx   . ADD / ADHD Father   . Diabetes Maternal  Grandmother   . Diabetes Maternal Grandfather    Social History:  History  Alcohol Use No    Comment: denies usage     History  Drug Use No    History   Social History  . Marital Status: Single    Spouse Name: N/A  . Number of Children: 0  . Years of Education: 12th   Occupational History  .      not working   Social History Main Topics  . Smoking status: Former Smoker -- 0.00 packs/day for 2 years    Types: Cigarettes    Quit date: 10/06/2012  . Smokeless tobacco: Never Used  . Alcohol Use: No     Comment: denies usage  . Drug Use: No  . Sexual Activity: Yes   Other Topics Concern  . None   Social History Narrative   Patient lives at home with girlfriend and family.   Caffeine Use: occasionally   Additional History:    Sleep: Good  Appetite:  Good   Assessment:   Musculoskeletal: Strength & Muscle Tone: within normal limits Gait & Station: normal Patient leans: N/A   Psychiatric Specialty Exam: Physical Exam  ROS denied dry mouth, constipation, nausea, vomiting's, dizziness and stomach pain.   Blood pressure 135/88, pulse 75, temperature 98.4 F (36.9 C), temperature source Oral, resp. rate 16, height 5' 6"  (1.676 m), weight 88.451 kg (195 lb).Body mass index is 31.49 kg/(m^2).  General Appearance: Disheveled and Guarded  Eye Contact::  Good  Speech:  Clear and Coherent  Volume:  Decreased  Mood:  Anxious and Depressed  Affect:  Congruent, Constricted and Depressed  Thought Process:  Coherent and Goal Directed  Orientation:  Full (Time, Place, and Person)  Thought Content:  Rumination  Suicidal Thoughts:  Yes.  with intent/plan  Homicidal Thoughts:  No  Memory:  Immediate;   Good Recent;   Good  Judgement:  Intact  Insight:  Good  Psychomotor Activity:  Decreased  Concentration:  Fair  Recall:  [employer] of Knowledge:Fair  Language: Good  Akathisia:  Negative  Handed:  Right  AIMS (if indicated):     Assets:  Communication  Skills Desire for Improvement Housing Leisure Time Physical Health Resilience Social Support Talents/Skills  ADL's:  Intact  Cognition: WNL  Sleep:  Number of Hours: 6.75     Current Medications: Current Facility-Administered Medications  Medication Dose Route Frequency Provider Last Rate Last Dose  . acetaminophen (TYLENOL) tablet 650 mg  650 mg Oral Q6H PRN Harriet Butte, NP      . albuterol (PROVENTIL HFA;VENTOLIN HFA) 108 (90 BASE) MCG/ACT inhaler 2 puff  2 puff Inhalation Q4H PRN Harriet Butte, NP      . albuterol (PROVENTIL) (2.5 MG/3ML) 0.083% nebulizer solution 2.5 mg  2.5 mg Nebulization Q6H PRN Harriet Butte, NP      . alum & mag hydroxide-simeth (MAALOX/MYLANTA) 200-200-20 MG/5ML suspension 30 mL  30 mL Oral Q4H PRN Harriet Butte, NP      . atomoxetine (STRATTERA) capsule 100 mg  100 mg Oral q morning - 10a Harriet Butte, NP   100 mg at 11/11/14 0954  . divalproex (DEPAKOTE) DR tablet 500 mg  500 mg Oral BID Harriet Butte, NP   500 mg at 11/11/14 0758  . famotidine (PEPCID) tablet 10 mg  10 mg Oral Daily Harriet Butte, NP   10 mg at 11/11/14 0758  . fluticasone (FLONASE) 50 MCG/ACT nasal spray 1 spray  1 spray Each Nare Daily Harriet Butte, NP   1 spray at 11/11/14 0758  . hydrOXYzine (ATARAX/VISTARIL) tablet 25 mg  25 mg Oral TID PRN Harriet Butte, NP   25 mg at 11/09/14 2133  . magnesium hydroxide (MILK OF MAGNESIA) suspension 30 mL  30 mL Oral Daily PRN Harriet Butte, NP      . mesalamine (LIALDA) EC tablet 1.2 g  1.2 g Oral Q breakfast [name], MD   1.2 g at 11/11/14 0758  . methylphenidate (CONCERTA) CR tablet 36 mg  36 mg Oral BH-q7a [name], MD   36 mg at 11/11/14 6144  . montelukast (SINGULAIR) tablet 10 mg  10 mg Oral QHS Harriet Butte, NP   10 mg at 11/10/14 2123  . sertraline (ZOLOFT) tablet 100 mg  100 mg Oral Daily Harriet Butte, NP   100 mg at 11/11/14 0758  . traZODone (DESYREL) tablet 100 mg  100 mg Oral QHS PRN  Harriet Butte, NP   100 mg at 11/10/14 2123    Lab Results:  Results for orders placed or performed during the hospital encounter of 11/09/14 (from the past 48 hour(s))  Valproic acid level     Status: None   Collection Time: 11/10/14  6:28 AM  Result Value Ref Range   Valproic Acid Lvl 84 50.0 - 100.0 ug/mL    Comment: Performed at [facility]    Physical Findings: AIMS: Facial and Oral Movements Muscles of Facial Expression: None, normal Lips and Perioral Area: None, normal Jaw: None, normal Tongue: None, normal,Extremity Movements Upper (arms, wrists, hands, fingers): None, normal Lower (legs, knees, ankles, toes): None, normal, Trunk Movements Neck, shoulders, hips: None, normal, Overall Severity Severity of abnormal movements (highest score from questions above): None, normal Incapacitation due to abnormal movements: None, normal Patient's awareness of abnormal movements (rate only patient's report): No Awareness, Dental Status Current problems with teeth and/or dentures?: No Does patient usually wear dentures?: No  CIWA:    COWS:     Treatment Plan Summary: Patient has been compliant with his medication and slowly improving his symptoms of depression and anxiety by actively participating in therapeutic milieu. Daily contact with patient to assess and evaluate symptoms and progress in treatment and Medication management  Treatment Plan/Recommendations:  1. Admit for crisis management and stabilization. 2. Medication management to reduce current symptoms to base line and improve the patient's overall level of functioning. Strattera 100 mg daily for ADHD Depakote 500 mg twice daily for bipolar mood swings Concerta 36 mg daily morning for ADHD Zoloft 100 mg daily morning for depression Trazodone 100 mg at bedtime as needed for insomnia 3. Treat health problems as indicated. 4. Develop treatment plan to decrease risk of relapse upon discharge and to reduce the need  for readmission. 5. Psycho-social education regarding relapse prevention and self care. 6. Health care follow up as needed for medical problems. 7. Restart home medications where appropriate.   Medical Decision Making:  Review of Psycho-Social Stressors (1), Review or order clinical lab tests (1), Established Problem, Worsening (2), Review of Last Therapy Session (1), Review of Medication Regimen & Side Effects (2) and Review  of New Medication or Change in Dosage (2)     Layni Kreamer,JANARDHAHA R. 11/11/2014, 3:28 PM

## 2014-11-11 NOTE — B[facility] Group Notes (Signed)
[place] Group Notes:  (Clinical Social Work)  11/11/2014     1:15-2:15PM  Summary of Progress/Problems:   The main focus of today's process group was to learn how to use a decisional balance exercise to move forward in the Stages of Change, which were described and discussed.  Motivational Interviewing and a worksheet were utilized to help patients explore in depth the perceived benefits and costs of unhealthy coping techniques, as well as the  benefits and costs of replacing that with a healthy coping skills.   The patient expressed that he is in the hospital due to suicidal ideation and considering jumping off a bridge, stating he is very stressed with his family.  A healthy coping skill that he wants to work on is positive thinking, and because he has a child on the way, he states he needs to learn stress management regarding his family.  He identified barriers as the family members themselves.  He is interested in a therapist to help work through the issues, how to handle them.  Type of Therapy:  Group Therapy - Process   Participation Level:  Active  Participation Quality:  Attentive  Affect:  Blunted and Depressed  Cognitive:  Oriented  Insight:  Developing/Improving  Engagement in Therapy:  Developing/Improving  Modes of Intervention:  Education, Motivational Interviewing  Wayne Dominion, LCSW 11/11/2014, 5:21 PM

## 2014-11-11 NOTE — Progress Notes (Signed)
D: Patient is alert and oriented. Pt's mood and affect is anxious and flat. Pt's interaction is child-like at times. Pt denies SI and AVH. Pt reports thoughts to hurt others, pt forwards little but denies thoughts to hurt anyone present here at [facility]. Pt rates depression, hopelessness, and anxiety 0/10. Pt reports his goal for the day is "my relationship with my mother." Pt is attending unit groups today. Pt minimally engages with RN throughout the day. A: Active listening by RN. Encouragement/Support provided to pt. Medication education reviewed with pt. Scheduled medications administered per providers orders (See MAR). 15 minute checks continued per protocol for patient safety.  R: Patient cooperative and receptive to nursing interventions. Pt remains safe.

## 2014-11-11 NOTE — B[facility] Group Notes (Addendum)
Pt engaged in the wrap up group by stating that he needs to start thinking more positively instead of negative. Pt shared that he has close supports that he can talk too when he feels upset and also shared that he needs to get himself together because he has a baby on the way and he wants to be a good father to his child. Pt denies SI at this time.   Redmond Pulling, MA OBS Counselor

## 2014-11-11 NOTE — BHH Group Notes (Signed)
[place] Group Notes:  (Nursing/MHT/Case Management/Adjunct)  Date:  11/11/2014  Time:  1000   Type of Therapy:  Nurse Education  Participation Level:  Minimal  Participation Quality:  Drowsy  Affect:  Blunted  Cognitive:  Oriented  Insight:  Improving  Engagement in Group:  Limited  Modes of Intervention:  Discussion, Education and Support  Summary of Progress/Problems: Discussed healthy coping skills and choices. Reviewed importance of healthy self esteem as well as healthy relationships vs toxic relationships. Patient drowsy from sleep meds but attentive.      Jamie Kato 11/11/2014, 11:30 AM

## 2014-11-12 NOTE — Progress Notes (Signed)
[place] Group Notes:  (Nursing/MHT/Case Management/Adjunct)  Date:  11/12/2014  Time:  10:29 PM  Type of Therapy:  Psychoeducational Skills  Participation Level:  Minimal  Participation Quality:  Drowsy  Affect:  Flat  Cognitive:  Lacking  Insight:  Limited  Engagement in Group:  Limited  Modes of Intervention:  Discussion  Summary of Progress/Problems: Tonight in wrap up group Wayne Avila wasn't as engaged in the discussion. He said that the medicine he was on caused him to feel very drowsy. He only mentioned that he had a good day. Jeanette Caprice 11/12/2014, 10:29 PM

## 2014-11-12 NOTE — Progress Notes (Signed)
Patient ID: Wayne Avila, male   DOB: [birth year], 24 y.o.   MRN: [id number] [facility] MD Progress Note  11/12/2014 3:53 PM Wayne Avila  MRN:  [id number]   Subjective:  Wayne Avila has no complaints today and appeared participating in group therapies. Patient reported he has been feeling better since admitted to the hospital and being compliant with his medications. Patient minimizes his symptoms of depression, anxiety and suicidal thoughts today.  Patient reported he has a multiple stresses especially related to his of family members, financial difficulties, no job.   Patient reportedly lives with his mother, grandmother and aunt. Patient has been compliant with his current medication management and reportedly no adverse affects. Patient denied disturbance of sleep and appetite at this time and contract for safety while in the hospital. Patient has no homicidal ideation or psychotic symptoms during this visit.  Objective: Patient is calm and cooperative during this assessment. Patient endorses symptoms of depression, anxiety, isolation, frustration, helplessness and worthlessness. Patient stated he is on disability but that his mother is his pain he does not give money to him. Patient girlfriend being pregnant, and frustration about disability, not being in college. Prior history of trying to hang self a few months ago related to being bullied.    Principal Problem: Bipolar 1 disorder, manic, moderate Diagnosis:   Patient Active Problem List   Diagnosis Date Noted  . Bipolar 1 disorder, manic, moderate [F31.12] 11/09/2014  . Suicidal ideation [R45.851]   . Major depressive disorder, recurrent episode, moderate [F33.1]   . Oral candida [B37.0] 02/23/2014  . UC (ulcerative colitis) [K51.90] 10/25/2013  . Abdominal pain, acute [R10.0] 09/20/2013  . Rectal bleeding [K62.5] 08/09/2013  . Unintentional weight loss [R63.4] 08/09/2013  . RUQ pain [R10.11] 08/09/2013  . Unspecified gastritis and  gastroduodenitis without mention of hemorrhage [K29.70, K29.90] 04/20/2013  . Upper abdominal pain [R10.10] 12/01/2012  . Nausea with vomiting [R11.2] 12/01/2012  . Mental developmental delay [R62.50] 11/15/2012   Total Time spent with patient: 30 minutes   Past Medical History:  Past Medical History  Diagnosis Date  . Asthma   . ADHD (attention deficit hyperactivity disorder)   . Bipolar 1 disorder   . GERD (gastroesophageal reflux disease)   . Depression   . Anxiety   . Mental developmental delay 11/15/2012  . PONV (postoperative nausea and vomiting)   . Hearing loss   . HOH (hard of hearing)   . Abdominal pain, chronic, right upper quadrant   . Colitis   . Seizures     Past Surgical History  Procedure Laterality Date  . Tonsillectomy    . Adenoidectomy    . Esophagogastroduodenoscopy (egd) with propofol N/A 12/07/2012    SLF:The mucosa of the esophagus appeared normal Non-erosive gastritis (inflammation) was found in the gastric antrum; multiple biopsies The duodenal mucosa showed no abnormalities in the bulb and second portion of the duodenum  . Esophageal biopsy N/A 12/07/2012    Procedure: GASTRIC BIOPSIES;  Surgeon: [name], MD;  Location: [facility];  Service: Endoscopy;  Laterality: N/A;  . Colonoscopy with propofol N/A 08/30/2013    GEX:BMWUXL mucosa in the terminal ileum/COLITIS/ MILD PROCTITIS. 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  . Esophageal biopsy N/A 08/30/2013    Procedure: BIOPSY;  Surgeon: [name], MD;  Location: [facility];  Service: Endoscopy;  Laterality: N/A;  right colon,transverse colon, left colon, rectal  biopsies   Family History:  Family History  Problem Relation Age of Onset  . Asthma Mother   . Ulcers Mother   . Bipolar disorder Mother   . Colon cancer Neg Hx   . Liver disease Neg Hx   . ADD / ADHD Father   . Diabetes Maternal  Grandmother   . Diabetes Maternal Grandfather    Social History:  History  Alcohol Use No    Comment: denies usage     History  Drug Use No    History   Social History  . Marital Status: Single    Spouse Name: N/A  . Number of Children: 0  . Years of Education: 12th   Occupational History  .      not working   Social History Main Topics  . Smoking status: Former Smoker -- 0.00 packs/day for 2 years    Types: Cigarettes    Quit date: 10/06/2012  . Smokeless tobacco: Never Used  . Alcohol Use: No     Comment: denies usage  . Drug Use: No  . Sexual Activity: Yes   Other Topics Concern  . None   Social History Narrative   Patient lives at home with girlfriend and family.   Caffeine Use: occasionally   Additional History:    Sleep: Good  Appetite:  Good   Assessment:   Musculoskeletal: Strength & Muscle Tone: within normal limits Gait & Station: normal Patient leans: N/A   Psychiatric Specialty Exam: Physical Exam  ROS denied dry mouth, constipation, nausea, vomiting's, dizziness and stomach pain.   Blood pressure 128/77, pulse 88, temperature 97.4 F (36.3 C), temperature source Oral, resp. rate 18, height 5' 6"  (1.676 m), weight 88.451 kg (195 lb).Body mass index is 31.49 kg/(m^2).  General Appearance: Disheveled and Guarded  Eye Contact::  Good  Speech:  Clear and Coherent  Volume:  Decreased  Mood:  Anxious and Depressed  Affect:  Congruent, Constricted and Depressed  Thought Process:  Coherent and Goal Directed  Orientation:  Full (Time, Place, and Person)  Thought Content:  Rumination  Suicidal Thoughts:  Yes.  with intent/plan  Homicidal Thoughts:  No  Memory:  Immediate;   Good Recent;   Good  Judgement:  Intact  Insight:  Good  Psychomotor Activity:  Decreased  Concentration:  Fair  Recall:  [employer] of Knowledge:Fair  Language: Good  Akathisia:  Negative  Handed:  Right  AIMS (if indicated):     Assets:  Communication  Skills Desire for Improvement Housing Leisure Time Physical Health Resilience Social Support Talents/Skills  ADL's:  Intact  Cognition: WNL  Sleep:  Number of Hours: 6.75     Current Medications: Current Facility-Administered Medications  Medication Dose Route Frequency Provider Last Rate Last Dose  . acetaminophen (TYLENOL) tablet 650 mg  650 mg Oral Q6H PRN Harriet Butte, NP      . albuterol (PROVENTIL HFA;VENTOLIN HFA) 108 (90 BASE) MCG/ACT inhaler 2 puff  2 puff Inhalation Q4H PRN Harriet Butte, NP      . albuterol (PROVENTIL) (2.5 MG/3ML) 0.083% nebulizer solution 2.5 mg  2.5 mg Nebulization Q6H PRN Harriet Butte, NP      . alum & mag hydroxide-simeth (MAALOX/MYLANTA) 200-200-20 MG/5ML suspension 30 mL  30 mL Oral Q4H PRN Harriet Butte, NP      . atomoxetine (STRATTERA) capsule 100 mg  100 mg Oral q morning - 10a Harriet Butte, NP   100 mg at 11/12/14  7290  . divalproex (DEPAKOTE) DR tablet 500 mg  500 mg Oral BID Harriet Butte, NP   500 mg at 11/12/14 0801  . famotidine (PEPCID) tablet 10 mg  10 mg Oral Daily Harriet Butte, NP   10 mg at 11/12/14 0801  . fluticasone (FLONASE) 50 MCG/ACT nasal spray 1 spray  1 spray Each Nare Daily Harriet Butte, NP   1 spray at 11/12/14 0801  . hydrOXYzine (ATARAX/VISTARIL) tablet 25 mg  25 mg Oral TID PRN Harriet Butte, NP   25 mg at 11/09/14 2133  . magnesium hydroxide (MILK OF MAGNESIA) suspension 30 mL  30 mL Oral Daily PRN Harriet Butte, NP      . mesalamine (LIALDA) EC tablet 1.2 g  1.2 g Oral Q breakfast Myer Peer Cobos, MD   1.2 g at 11/12/14 0801  . methylphenidate (CONCERTA) CR tablet 36 mg  36 mg Oral BH-q7a [name], MD   36 mg at 11/12/14 0641  . montelukast (SINGULAIR) tablet 10 mg  10 mg Oral QHS Harriet Butte, NP   10 mg at 11/11/14 2158  . sertraline (ZOLOFT) tablet 100 mg  100 mg Oral Daily Harriet Butte, NP   100 mg at 11/12/14 0802  . traZODone (DESYREL) tablet 100 mg  100 mg Oral QHS PRN  Harriet Butte, NP   100 mg at 11/11/14 2139    Lab Results:  No results found for this or any previous visit (from the past 48 hour(s)).  Physical Findings: AIMS: Facial and Oral Movements Muscles of Facial Expression: None, normal Lips and Perioral Area: None, normal Jaw: None, normal Tongue: None, normal,Extremity Movements Upper (arms, wrists, hands, fingers): None, normal Lower (legs, knees, ankles, toes): None, normal, Trunk Movements Neck, shoulders, hips: None, normal, Overall Severity Severity of abnormal movements (highest score from questions above): None, normal Incapacitation due to abnormal movements: None, normal Patient's awareness of abnormal movements (rate only patient's report): No Awareness, Dental Status Current problems with teeth and/or dentures?: No Does patient usually wear dentures?: No  CIWA:    COWS:     Treatment Plan Summary: Patient has been compliant with his medication and slowly improving his symptoms of depression and anxiety by actively participating in therapeutic milieu. Daily contact with patient to assess and evaluate symptoms and progress in treatment and Medication management  Treatment Plan/Recommendations:  1. Admit for crisis management and stabilization. 2. Medication management to reduce current symptoms to base line and improve the patient's overall level of functioning. Strattera 100 mg daily for ADHD Depakote 500 mg twice daily for bipolar mood swings Concerta 36 mg daily morning for ADHD Zoloft 100 mg daily morning for depression Trazodone 100 mg at bedtime as needed for insomnia 3. Treat health problems as indicated. 4. Develop treatment plan to decrease risk of relapse upon discharge and to reduce the need for readmission. 5. Psycho-social education regarding relapse prevention and self care. 6. Health care follow up as needed for medical problems. 7. Restart home medications where appropriate.   Medical Decision Making:   Review of Psycho-Social Stressors (1), Review or order clinical lab tests (1), Established Problem, Worsening (2), Review of Last Therapy Session (1), Review of Medication Regimen & Side Effects (2) and Review of New Medication or Change in Dosage (2)     Solymar Grace,JANARDHAHA R. 11/12/2014, 3:53 PM

## 2014-11-12 NOTE — [facility] Group Notes (Signed)
[facility] Group Notes:  (Clinical Social Work)  11/12/2014  1:15-2:15PM  Summary of Progress/Problems:   The main focus of today's process group was to   1)  discuss the importance of adding supports  2)  define health supports versus unhealthy supports  3)  identify the patient's current unhealthy supports and plan how to handle them  4)  Identify the patient's current healthy supports and plan what to add.  An emphasis was placed on using counselor, doctor, therapy groups, 12-step groups, and problem-specific support groups to expand supports.    The patient expressed full comprehension of the concepts presented, and agreed that there is a need to add more supports.  The patient stated that his mother has said she will be more supportive of his football games, and his friends have said the same.  He indicated a growing awareness that he needs to be a positive support for himself.  Type of Therapy:  Process Group with Motivational Interviewing  Participation Level:  Active  Participation Quality:  Attentive and Sharing  Affect:  Blunted  Cognitive:  Alert  Insight:  Developing/Improving  Engagement in Therapy:  Engaged  Modes of Intervention:   Education, Support and Processing, Activity  Selmer Dominion, LCSW 11/12/2014

## 2014-11-12 NOTE — Progress Notes (Signed)
Patient up and visible in milieu. Affect remains blunted, mood somewhat anxious though patient denies rating his anxiety a 01/0. Also rates depression and hopelessness at a 0/10. Patient denying pain or physical problems. Asking about discharge. States his goal is "to be on my [best] behavior" and he plans to meet that by "doing a lot better to meet my goal." Patient medicated per orders. Offered support and reassurance. Asked patient about his report of thoughts to harm others yesterday and patient responded, "I was just frustrated with my dad. I talked to him on the phone and when he drinks, he's mean." Suggested patient limit contact with father. Patient verbally contracted for safety. No SI/AVH. Patient remains safe and is cooperative. Wayne Avila

## 2014-11-12 NOTE — Plan of Care (Signed)
Problem: Alteration in mood Goal: STG-Patient reports thoughts of self-harm to staff Outcome: Progressing Patient denying SI  Problem: Diagnosis: Increased Risk For Suicide Attempt Goal: STG-Patient Will Comply With Medication Regime Outcome: Progressing Patient is med compliant.

## 2014-11-12 NOTE — Plan of Care (Signed)
Problem: Diagnosis: Increased Risk For Suicide Attempt Goal: STG-Patient Will Comply With Medication Regime Outcome: Progressing Patient is compliant with scheduled medications.

## 2014-11-12 NOTE — Progress Notes (Signed)
Patient has been up and active on the unit, attended group this evening and is compliant with his medications. Patient currently denies having pain, -si/hi/a/v hall. Support and encouragement offered, safety maintained on unit, will continue to monitor.

## 2014-11-13 DIAGNOSIS — F3112 Bipolar disorder, current episode manic without psychotic features, moderate: Principal | ICD-10-CM

## 2014-11-13 MED ORDER — MESALAMINE ER 0.375 G PO CP24: 1.5000 g | ORAL_CAPSULE | Freq: Every day | ORAL | Status: DC

## 2014-11-13 MED ORDER — METHYLPHENIDATE HCL ER (OSM) 18 MG PO TBCR: 18.0000 mg | EXTENDED_RELEASE_TABLET | ORAL | Status: DC

## 2014-11-13 MED ORDER — METHYLPHENIDATE HCL ER (OSM) 36 MG PO TBCR: 36.0000 mg | EXTENDED_RELEASE_TABLET | ORAL | Status: DC

## 2014-11-13 MED ORDER — FLUTICASONE PROPIONATE 50 MCG/ACT NA SUSP: 1.0000 | Freq: Every day | NASAL | Status: DC

## 2014-11-13 MED ORDER — RANITIDINE HCL 150 MG PO TABS: 150.0000 mg | ORAL_TABLET | Freq: Every day | ORAL | Status: DC

## 2014-11-13 MED ORDER — IBUPROFEN 800 MG PO TABS: 800.0000 mg | ORAL_TABLET | Freq: Three times a day (TID) | ORAL | Status: DC

## 2014-11-13 MED ORDER — ATOMOXETINE HCL 100 MG PO CAPS: 100.0000 mg | ORAL_CAPSULE | Freq: Every morning | ORAL | Status: DC

## 2014-11-13 MED ORDER — TRAZODONE HCL 100 MG PO TABS: 100.0000 mg | ORAL_TABLET | Freq: Every evening | ORAL | Status: DC | PRN

## 2014-11-13 MED ORDER — DIVALPROEX SODIUM 500 MG PO DR TAB
500.0000 mg | DELAYED_RELEASE_TABLET | Freq: Two times a day (BID) | ORAL | Status: DC
Start: 2014-11-13 — End: 2015-07-03

## 2014-11-13 MED ORDER — SERTRALINE HCL 100 MG PO TABS: 100.0000 mg | ORAL_TABLET | Freq: Every day | ORAL | Status: DC

## 2014-11-13 MED ORDER — ATOMOXETINE HCL 25 MG PO CAPS
25.0000 mg | ORAL_CAPSULE | Freq: Every day | ORAL | Status: DC
  Filled 2014-11-13: qty 12

## 2014-11-13 MED ORDER — MONTELUKAST SODIUM 10 MG PO TABS: 10.0000 mg | ORAL_TABLET | Freq: Every day | ORAL | Status: DC

## 2014-11-13 NOTE — Progress Notes (Signed)
  [facility] Adult Case Management Discharge Plan :  Will you be returning to the same living situation after discharge:  Yes,  Patient is returning to mother's home. At discharge, do you have transportation home?: Yes,  Mother will arrange transportation. Do you have the ability to pay for your medications: Yes,  Patient has Medicaid  Release of information consent forms completed and in the chart;  Patient's signature needed at discharge.  Patient to Follow up at: Follow-up Information    Follow up with [facility] On 11/15/2014.   Why:  Wednesday, November 15, 2014 at 10 AM   Contact information:   [address] [place], [place]   [address]  [phone]      Follow up with [facility], [place] On 11/14/2014.   Why:  Tuesday, November 14, 2014 at 1:30 PM   Contact information:   [address] [place], [place]   [address]  [phone]      Patient denies SI/HI: Patient no longer endorsing SI/HI or other thoughts of self harm.   Safety Planning and Suicide Prevention discussed:.Reviewed with all patients during discharge planning group   Have you used any form of tobacco in the last 30 days? (Cigarettes, Smokeless Tobacco, Cigars, and/or Pipes): No  Has patient been referred to the Quitline?: No referral needed to quitline.   Concha Pyo 11/13/2014, 12:42 PM

## 2014-11-13 NOTE — Progress Notes (Signed)
D- Patient is cooperative and interacting well with others on the unit.  Denies SI/HI/AVH.  No complaints.  Patient expresses readiness for discharge.  According to patient's self-inventory sheet, patient reports that he slept good and rates his depression, feelings of hopelessness, and anxiety a "0/10" with "10" being the worst. A- Support and encouragement provided.  Routine safety checks conducted every 15 minutes.  Patient informed to notify staff with problems or concerns. R- Patient contracts for safety at this time.  Safety maintained on the unit.

## 2014-11-13 NOTE — Progress Notes (Signed)
Patient ID: Wayne Avila, male   DOB: 03-15-1991, 24 y.o.   MRN: [id number] Discharge- Patient verbalizes readiness for discharge: Denies SI/HI, is not psychotic or delusional. A- Discharge instructions read and discussed with patient.  All belongings returned to patient R- Patient cooperative during discharge process.  Patient verbalizes understanding of discharge instructions.  Signed for return of belongings. Escorted to the lobby. Patient discharged to care of mother.

## 2014-11-13 NOTE — Discharge Summary (Signed)
Physician Discharge Summary Note  Patient:  Wayne Avila is an 24 y.o., male MRN:  [id number] DOB:  1990-08-25 Patient phone:  [phone] (home)  Patient address:   [place] [address],  Total Time spent with patient: 30 minutes  Date of Admission:  11/09/2014 Date of Discharge: 11/13/14  Reason for Admission:  Mood stabilization treatments  Principal Problem: Bipolar 1 disorder, manic, moderate Discharge Diagnoses: Patient Active Problem List   Diagnosis Date Noted  . Bipolar I disorder, most recent episode depressed [F31.30]   . Bipolar 1 disorder, manic, moderate [F31.12] 11/09/2014  . Suicidal ideation [R45.851]   . Major depressive disorder, recurrent episode, moderate [F33.1]   . Oral candida [B37.0] 02/23/2014  . UC (ulcerative colitis) [K51.90] 10/25/2013  . Abdominal pain, acute [R10.0] 09/20/2013  . Rectal bleeding [K62.5] 08/09/2013  . Unintentional weight loss [R63.4] 08/09/2013  . RUQ pain [R10.11] 08/09/2013  . Unspecified gastritis and gastroduodenitis without mention of hemorrhage [K29.70, K29.90] 04/20/2013  . Upper abdominal pain [R10.10] 12/01/2012  . Nausea with vomiting [R11.2] 12/01/2012  . Mental developmental delay [R62.50] 11/15/2012    Musculoskeletal: Strength & Muscle Tone: within normal limits Gait & Station: normal Patient leans: N/A  Psychiatric Specialty Exam: Physical Exam  Psychiatric: He has a normal mood and affect. His speech is normal and behavior is normal. Judgment and thought content normal. Cognition and memory are normal.    Review of Systems  Constitutional: Negative.   HENT: Negative.   Eyes: Negative.   Respiratory: Negative.   Cardiovascular: Negative.   Gastrointestinal: Negative.   Genitourinary: Negative.   Musculoskeletal: Negative.   Skin: Negative.   Neurological: Negative.   Endo/Heme/Allergies: Negative.   Psychiatric/Behavioral: Positive for depression (Stabilized with treatments).  Negative for suicidal ideas, hallucinations, memory loss and substance abuse. The patient is not nervous/anxious and does not have insomnia.     Blood pressure 140/80, pulse 81, temperature 97.7 F (36.5 C), temperature source Oral, resp. rate 16, height 5' 6"  (1.676 m), weight 88.451 kg (195 lb).Body mass index is 31.49 kg/(m^2).  See Physician SRA     Have you used any form of tobacco in the last 30 days? (Cigarettes, Smokeless Tobacco, Cigars, and/or Pipes): No  Has this patient used any form of tobacco in the last 30 days? (Cigarettes, Smokeless Tobacco, Cigars, and/or Pipes) No  Past Medical History:  Past Medical History  Diagnosis Date  . Asthma   . ADHD (attention deficit hyperactivity disorder)   . Bipolar 1 disorder   . GERD (gastroesophageal reflux disease)   . Depression   . Anxiety   . Mental developmental delay 11/15/2012  . PONV (postoperative nausea and vomiting)   . Hearing loss   . HOH (hard of hearing)   . Abdominal pain, chronic, right upper quadrant   . Colitis   . Seizures     Past Surgical History  Procedure Laterality Date  . Tonsillectomy    . Adenoidectomy    . Esophagogastroduodenoscopy (egd) with propofol N/A 12/07/2012    SLF:The mucosa of the esophagus appeared normal Non-erosive gastritis (inflammation) was found in the gastric antrum; multiple biopsies The duodenal mucosa showed no abnormalities in the bulb and second portion of the duodenum  . Esophageal biopsy N/A 12/07/2012    Procedure: GASTRIC BIOPSIES;  Surgeon: [name], MD;  Location: [facility];  Service: Endoscopy;  Laterality: N/A;  . Colonoscopy with propofol N/A 08/30/2013    LGX:QJJHER mucosa in the terminal ileum/COLITIS/  MILD PROCTITIS. 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  . Esophageal biopsy N/A 08/30/2013    Procedure: BIOPSY;  Surgeon: [name], MD;  Location: [facility];  Service: Endoscopy;  Laterality: N/A;  right colon,transverse colon, left colon, rectal biopsies   Family History:  Family History  Problem Relation Age of Onset  . Asthma Mother   . Ulcers Mother   . Bipolar disorder Mother   . Colon cancer Neg Hx   . Liver disease Neg Hx   . ADD / ADHD Father   . Diabetes Maternal Grandmother   . Diabetes Maternal Grandfather    Social History:  History  Alcohol Use No    Comment: denies usage     History  Drug Use No    History   Social History  . Marital Status: Single    Spouse Name: N/A  . Number of Children: 0  . Years of Education: 12th   Occupational History  .      not working   Social History Main Topics  . Smoking status: Former Smoker -- 0.00 packs/day for 2 years    Types: Cigarettes    Quit date: 10/06/2012  . Smokeless tobacco: Never Used  . Alcohol Use: No     Comment: denies usage  . Drug Use: No  . Sexual Activity: Yes   Other Topics Concern  . None   Social History Narrative   Patient lives at home with girlfriend and family.   Caffeine Use: occasionally   Risk to Self: Is patient at risk for suicide?: Yes What has been your use of drugs/alcohol within the last 12 months?: Patient denies  Risk to Others:   Prior Inpatient Therapy:   Prior Outpatient Therapy:    Level of Care:  OP  Hospital Course:    Wayne Avila is an 24 y.o. male brought to ED by mother due to increasing depression, aggression, and SI. Mom petitioned for IVC. She reports pt has been dx with bipolar, ADHD, and intermittent explosive disorder. 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 She does not know IQ level or official dx. She reports pt is followed by [facility] for therapy and medication management. Pt takes medications but refuses he trazdone. Pt also has hearing difficulties and refuses to wear his hearing aids. At time of assessment pt was cooperative, reports feeling  stressed, depressed and anxious. He denies SI, HI, AVH, self-harm and SA. Pt often reports "I don't know" in response to questions and appears to experience thought blocking, however, this could be related to developmental delay. Pt reports he is under a great deal of stress due to not having a job, lack of access to money, feeling his mother does not give him the help and support he needs, wanting to file for divorces, and having a baby on the way.          Wayne Avila was admitted to the adult 400 unit. He was evaluated and his symptoms were identified. Medication management was discussed and initiated. His Depakote was continued for mood stability.  His Concerta dosage was decreased during his hospital admission to 18 mg by time of discharge to reduce any side effects from this medications such as psychosis.  His Zoloft 100 mg daily was continued for treatment of depression. He was oriented to the unit and  encouraged to participate in unit programming. Medical problems were identified and treated appropriately. Home medication was restarted as needed.        The patient was evaluated each day by a clinical provider to ascertain the patient's response to treatment.  Improvement was noted by the patient's report of decreasing symptoms, improved sleep and appetite, affect, medication tolerance, behavior, and participation in unit programming.  He was asked each day to complete a self inventory noting mood, mental status, pain, new symptoms, anxiety and concerns.         He responded well to medication and being in a therapeutic and supportive environment. However, the patient appeared to minimize his symptoms and was focused on discharge.  Positive and appropriate behavior was noted and the patient was motivated for recovery. Patient reported he has a multiple stresses especially related to his of family members, financial difficulties, no job.   The patient worked closely with the treatment team and case  manager to develop a discharge plan with appropriate goals. Coping skills, problem solving as well as relaxation therapies were also part of the unit programming. Patient processed with staff healthy ways to cope with situations such as when he talks to his father who often drinks and then the patient becomes frustrated. His Depakote level was therapeutic at 84 on 11/10/14.          By the day of discharge he was in much improved condition than upon admission.  Symptoms were reported as significantly decreased or resolved completely. The patient denied SI/HI and voiced no AVH. He was motivated to continue taking medication with a goal of continued improvement in mental health. Wayne Avila was discharged home with a plan to follow up as noted below. The patient was provided with sample medications and prescriptions at time of discharge. He left [facility] in stable condition with all belongings returned to him.   Consults:  psychiatry  Significant Diagnostic Studies:  Chemistry profile, CBC, Depakote level, UDS positive for opiates,   Discharge Vitals:   Blood pressure 140/80, pulse 81, temperature 97.7 F (36.5 C), temperature source Oral, resp. rate 16, height 5' 6"  (1.676 m), weight 88.451 kg (195 lb). Body mass index is 31.49 kg/(m^2). Lab Results:   No results found for this or any previous visit (from the past 72 hour(s)).  Physical Findings: AIMS: Facial and Oral Movements Muscles of Facial Expression: None, normal Lips and Perioral Area: None, normal Jaw: None, normal Tongue: None, normal,Extremity Movements Upper (arms, wrists, hands, fingers): None, normal Lower (legs, knees, ankles, toes): None, normal, Trunk Movements Neck, shoulders, hips: None, normal, Overall Severity Severity of abnormal movements (highest score from questions above): None, normal Incapacitation due to abnormal movements: None, normal Patient's awareness of abnormal movements (rate only patient's report): No  Awareness, Dental Status Current problems with teeth and/or dentures?: No Does patient usually wear dentures?: No  CIWA:    COWS:      See Psychiatric Specialty Exam and Suicide Risk Assessment completed by Attending Physician prior to discharge.  Discharge destination:  Home  Is patient on multiple antipsychotic therapies at discharge:  No   Has Patient had three or more failed trials of antipsychotic monotherapy by history:  No  Recommended Plan for Multiple Antipsychotic Therapies: NA     Medication List    STOP taking these medications        acetaminophen-codeine 300-30 MG per tablet  Commonly known as:  TYLENOL #3     cephALEXin 500  MG capsule  Commonly known as:  KEFLEX      TAKE these medications      Indication   albuterol 108 (90 BASE) MCG/ACT inhaler  Commonly known as:  PROAIR HFA  Inhale 2 puffs into the lungs every 4 (four) hours as needed for wheezing. Persistent dry cough      atomoxetine 100 MG capsule  Commonly known as:  STRATTERA  Take 1 capsule (100 mg total) by mouth every morning.   Indication:  Attention Deficit Hyperactivity Disorder     divalproex 500 MG DR tablet  Commonly known as:  DEPAKOTE  Take 1 tablet (500 mg total) by mouth 2 (two) times daily.   Indication:  Rapidly Alternating Manic-Depressive Psychosis     fluticasone 50 MCG/ACT nasal spray  Commonly known as:  FLONASE  Place 1 spray into both nostrils daily.   Indication:  Hayfever     ibuprofen 800 MG tablet  Commonly known as:  ADVIL,MOTRIN  Take 1 tablet (800 mg total) by mouth 3 (three) times daily.   Indication:  Fever, Inflammation     loratadine 10 MG tablet  Commonly known as:  CLARITIN  Take 10 mg by mouth daily as needed for allergies or itching.      mesalamine 0.375 G 24 hr capsule  Commonly known as:  APRISO  Take 4 capsules (1.5 g total) by mouth daily.   Indication:  Ulcerated Colon     methylphenidate 18 MG CR tablet  Commonly known as:  CONCERTA   Take 1 tablet (18 mg total) by mouth every morning. For improved attention.  Start taking on:  11/14/2014   Indication:  Attention Deficit Hyperactivity Disorder, Depression     montelukast 10 MG tablet  Commonly known as:  SINGULAIR  Take 1 tablet (10 mg total) by mouth at bedtime.   Indication:  Hayfever     ranitidine 150 MG tablet  Commonly known as:  ZANTAC  Take 1 tablet (150 mg total) by mouth daily.   Indication:  Gastroesophageal Reflux Disease     sertraline 100 MG tablet  Commonly known as:  ZOLOFT  Take 1 tablet (100 mg total) by mouth daily.   Indication:  Major Depressive Disorder     traZODone 100 MG tablet  Commonly known as:  DESYREL  Take 1 tablet (100 mg total) by mouth at bedtime as needed for sleep (May repeat X1).   Indication:  Trouble Sleeping       Follow-up Information    Follow up with [facility] On 11/15/2014.   Why:  Wednesday, November 15, 2014 at 10 AM   Contact information:   [address] [place], [place]   [address]  [phone]      Follow up with [facility], [place] On 11/14/2014.   Why:  Tuesday, November 14, 2014 at 1:30 PM   Contact information:   [address] [place], [place]   [address]  [phone]      Follow-up recommendations:   Activity: as tolerated Diet: Regular Tests: NA Other: See below.  Comments:   Take all your medications as prescribed by your mental healthcare provider.  Report any adverse effects and or reactions from your medicines to your outpatient provider promptly.  Patient is instructed and cautioned to not engage in alcohol and or illegal drug use while on prescription medicines.  In the event of worsening symptoms, patient is instructed to call the crisis hotline, 911 and or go  to the nearest ED for appropriate evaluation and treatment of symptoms.  Follow-up with your primary care provider for your other medical issues, concerns and or health care needs.   Total Discharge Time: Greater than  30 minutes   Signed: DAVIS, LAURA NP-C 11/13/2014, 1:31 PM   Patient seen, Suicide Assessment Completed.  Disposition Plan Reviewed

## 2014-11-13 NOTE — Progress Notes (Signed)
Recreation Therapy Notes  Date: 06.06.16 Time: 9:30 am Location: 300 Hall Group Room  Group Topic: Coping Skills  Goal Area(s) Addresses:  Patient will verbalize importance of using healthy stress management. Patient will identify positive emotions associated with healthy stress management.  Intervention: Stress Management  Activity: Healthy Relaxation Guided Imagery.  LRT introduced and educated patients on stress management technique of guided imagery.  Script was used to deliver the technique to patients.  Patients were asked to follow script read aloud by LRT to engage in practicing the stress management technique.   Education: Radiographer, therapeutic, Dentist.   Education Outcome: Acknowledges understanding/In group clarification offered/Needs additional education.   Clinical Observations/Feedback: Patient did not attend group.    Victorino Sparrow, LRT/CTRS         Victorino Sparrow A 11/13/2014 3:35 PM

## 2014-11-13 NOTE — [facility] Group Notes (Signed)
[facility] LCSW Aftercare Discharge Planning Group Note   11/13/2014 12:41 PM    Participation Quality:  Appropraite  Mood/Affect:  Appropriate  Depression Rating:  0  Anxiety Rating:  0  Thoughts of Suicide:  No  Will you contract for safety?   NA  Current AVH:  No  Plan for Discharge/Comments:  Patient attended discharge planning group and actively participated in group.  He will follow up with [facility] in [place]. Suicide prevention education reviewed and SPE document provided.   Transportation Means: Patient has transportation.   Supports:  Patient has a support system.   Sherol Sabas, [place]

## 2014-11-13 NOTE — [facility] Suicide Risk Assessment (Signed)
[facility] Discharge Suicide Risk Assessment   Demographic Factors:  24 year  Old single male, unemployed, lives with mother .   Total Time spent with patient: 30 minutes  Musculoskeletal: Strength & Muscle Tone: within normal limits Gait & Station: normal Patient leans: N/A  Psychiatric Specialty Exam: Physical Exam  ROS  Blood pressure 140/80, pulse 81, temperature 97.7 F (36.5 C), temperature source Oral, resp. rate 16, height 5' 6"  (1.676 m), weight 195 lb (88.451 kg).Body mass index is 31.49 kg/(m^2).  General Appearance: Well Groomed  Eye Contact::  Good  Speech:  Normal Rate409  Volume:  Normal  Mood:  Euthymic  Affect:  Appropriate  Thought Process:  concrete, linear   Orientation:  Other:  fully alert and attentive   Thought Content:  at this time denies hallucinations and does not appear internally preoccupied. No delusions expressed  Suicidal Thoughts:  No- at this time denies any thoughts of hurting self or anyone else. Specifically also denies any thoughts of hurting any family members/ father .  Homicidal Thoughts:  No  Memory:   Recent and remote grossly intact   Judgement:  Other:  improved   Insight:  improved   Psychomotor Activity:  Normal  Concentration:  Fair  Recall:  Good  Fund of Knowledge:Good  Language: Good  Akathisia:  Negative  Handed:  Right  AIMS (if indicated):     Assets:  Desire for Improvement Physical Health Resilience  Sleep:  Number of Hours: 4.25  Cognition: WNL  ADL's: improved    Have you used any form of tobacco in the last 30 days? (Cigarettes, Smokeless Tobacco, Cigars, and/or Pipes): No  Has this patient used any form of tobacco in the last 30 days? (Cigarettes, Smokeless Tobacco, Cigars, and/or Pipes) No  Mental Status Per Nursing Assessment::   On Admission:     Current Mental Status by Physician: At this time patient is alert , attentive, calm, describes mood as normal, denies depression or anger, affect is calm and  appropriate, concrete thought process, but linear, no suicidal ideations, no homicidal ideations, no psychotic symptoms  Loss Factors: Disability, financial concerns . Another stressor is girlfriend being pregnant.   Historical Factors: History of learning disability, history of ADHD.  Risk Reduction Factors:   Sense of responsibility to family, Living with another person, especially a relative and Positive coping skills or problem solving skills  Continued Clinical Symptoms:  Overall improved, as noted above. Agrees to ongoing taper of Stimulant ( Concerta) . Has had no increased symptoms of ADHD, and is feeling better. He continues to take Strattera.   Cognitive Features That Contribute To Risk: History of Learning  Disability. At this time fully alert and attentive   Suicide Risk:  Mild:  Suicidal ideation of limited frequency, intensity, duration, and specificity.  There are no identifiable plans, no associated intent, mild dysphoria and related symptoms, good self-control (both objective and subjective assessment), few other risk factors, and identifiable protective factors, including available and accessible social support.  Principal Problem: Bipolar 1 disorder, manic, moderate Discharge Diagnoses:  Patient Active Problem List   Diagnosis Date Noted  . Bipolar 1 disorder, manic, moderate [F31.12] 11/09/2014  . Suicidal ideation [R45.851]   . Major depressive disorder, recurrent episode, moderate [F33.1]   . Oral candida [B37.0] 02/23/2014  . UC (ulcerative colitis) [K51.90] 10/25/2013  . Abdominal pain, acute [R10.0] 09/20/2013  . Rectal bleeding [K62.5] 08/09/2013  . Unintentional weight loss [R63.4] 08/09/2013  . RUQ pain [R10.11] 08/09/2013  .  Unspecified gastritis and gastroduodenitis without mention of hemorrhage [K29.70, K29.90] 04/20/2013  . Upper abdominal pain [R10.10] 12/01/2012  . Nausea with vomiting [R11.2] 12/01/2012  . Mental developmental delay [R62.50]  11/15/2012    Follow-up Information    Follow up with [facility] On 11/15/2014.   Why:  Wednesday, November 15, 2014 at 10 AM   Contact information:   [address] [place], [place]   [address]  [phone]      Follow up with Shirl Harris, [place] On 11/14/2014.   Why:  Tuesday, November 14, 2014 at 1:30 PM   Contact information:   [address] [place],    [address]  [phone]      Plan Of Care/Follow-up recommendations:  Activity:  as tolerated Diet:  Regular Tests:  NA Other:  See below.  Is patient on multiple antipsychotic therapies at discharge:  No  Do you recommend tapering to monotherapy for antipsychotics?  No   Has Patient had three or more failed trials of antipsychotic monotherapy by history:  No  Recommended Plan for Multiple Antipsychotic Therapies: NA   Plans to return to live with mother. Follow up as above .    COBOS, FERNANDO 11/13/2014, 10:32 AM

## 2014-11-13 NOTE — Progress Notes (Signed)
D. Pt had been up and visible in milieu, did attend and participate in evening group activity with minimal participation. Pt did speak how his day was ok, but did not forward much to staff. Pt was seen interacting appropriately with fellow peers in milieu, did receive all medications without incident and did not verbalize any complaints of pain. A. Support and encouragement provided. R. Safety maintained, will continue to monitor.

## 2014-11-26 ENCOUNTER — Encounter (HOSPITAL_COMMUNITY): Payer: Self-pay | Admitting: *Deleted

## 2014-11-26 ENCOUNTER — Emergency Department (HOSPITAL_COMMUNITY)
Admission: EM | Admit: 2014-11-26 | Discharge: 2014-11-26 | Disposition: A | Discharge status: Home / Self Care | Payer: Medicaid Other | Attending: Emergency Medicine | Admitting: Emergency Medicine

## 2014-11-26 ENCOUNTER — Emergency Department (HOSPITAL_COMMUNITY): Payer: Medicaid Other

## 2014-11-26 DIAGNOSIS — G8929 Other chronic pain: Secondary | ICD-10-CM | POA: Diagnosis not present

## 2014-11-26 DIAGNOSIS — Z7951 Long term (current) use of inhaled steroids: Secondary | ICD-10-CM | POA: Diagnosis not present

## 2014-11-26 DIAGNOSIS — Y92009 Unspecified place in unspecified non-institutional (private) residence as the place of occurrence of the external cause: Secondary | ICD-10-CM | POA: Insufficient documentation

## 2014-11-26 DIAGNOSIS — G40909 Epilepsy, unspecified, not intractable, without status epilepticus: Secondary | ICD-10-CM | POA: Diagnosis not present

## 2014-11-26 DIAGNOSIS — H9190 Unspecified hearing loss, unspecified ear: Secondary | ICD-10-CM | POA: Diagnosis not present

## 2014-11-26 DIAGNOSIS — J45909 Unspecified asthma, uncomplicated: Secondary | ICD-10-CM | POA: Insufficient documentation

## 2014-11-26 DIAGNOSIS — Y998 Other external cause status: Secondary | ICD-10-CM | POA: Insufficient documentation

## 2014-11-26 DIAGNOSIS — Z8719 Personal history of other diseases of the digestive system: Secondary | ICD-10-CM | POA: Diagnosis not present

## 2014-11-26 DIAGNOSIS — S20211A Contusion of right front wall of thorax, initial encounter: Secondary | ICD-10-CM | POA: Diagnosis not present

## 2014-11-26 DIAGNOSIS — Z79899 Other long term (current) drug therapy: Secondary | ICD-10-CM | POA: Insufficient documentation

## 2014-11-26 DIAGNOSIS — Y9389 Activity, other specified: Secondary | ICD-10-CM | POA: Diagnosis not present

## 2014-11-26 DIAGNOSIS — K219 Gastro-esophageal reflux disease without esophagitis: Secondary | ICD-10-CM | POA: Diagnosis not present

## 2014-11-26 DIAGNOSIS — Z880 Allergy status to penicillin: Secondary | ICD-10-CM | POA: Insufficient documentation

## 2014-11-26 DIAGNOSIS — W5582XA Struck by other mammals, initial encounter: Secondary | ICD-10-CM | POA: Insufficient documentation

## 2014-11-26 DIAGNOSIS — F909 Attention-deficit hyperactivity disorder, unspecified type: Secondary | ICD-10-CM | POA: Diagnosis not present

## 2014-11-26 DIAGNOSIS — F419 Anxiety disorder, unspecified: Secondary | ICD-10-CM | POA: Diagnosis not present

## 2014-11-26 DIAGNOSIS — F319 Bipolar disorder, unspecified: Secondary | ICD-10-CM | POA: Insufficient documentation

## 2014-11-26 DIAGNOSIS — S299XXA Unspecified injury of thorax, initial encounter: Secondary | ICD-10-CM | POA: Diagnosis present

## 2014-11-26 MED ORDER — ACETAMINOPHEN 325 MG PO TABS
650.0000 mg | ORAL_TABLET | Freq: Once | ORAL | Status: AC
  Administered 2014-11-26: 650 mg via ORAL
  Filled 2014-11-26: qty 2
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
## 2014-11-26 NOTE — Discharge Instructions (Signed)
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
## 2015-01-07 NOTE — Progress Notes (Signed)
REVIEWED-NO ADDITIONAL RECOMMENDATIONS. 

## 2015-01-21 ENCOUNTER — Emergency Department (HOSPITAL_COMMUNITY): Payer: Medicaid Other

## 2015-01-21 ENCOUNTER — Encounter (HOSPITAL_COMMUNITY): Payer: Self-pay | Admitting: Emergency Medicine

## 2015-01-21 ENCOUNTER — Emergency Department (HOSPITAL_COMMUNITY)
Admission: EM | Admit: 2015-01-21 | Discharge: 2015-01-21 | Disposition: A | Discharge status: Home / Self Care | Payer: Medicaid Other | Attending: Emergency Medicine | Admitting: Emergency Medicine

## 2015-01-21 DIAGNOSIS — M7551 Bursitis of right shoulder: Secondary | ICD-10-CM | POA: Diagnosis not present

## 2015-01-21 DIAGNOSIS — F419 Anxiety disorder, unspecified: Secondary | ICD-10-CM | POA: Insufficient documentation

## 2015-01-21 DIAGNOSIS — G8929 Other chronic pain: Secondary | ICD-10-CM | POA: Diagnosis not present

## 2015-01-21 DIAGNOSIS — Z880 Allergy status to penicillin: Secondary | ICD-10-CM | POA: Insufficient documentation

## 2015-01-21 DIAGNOSIS — Z79899 Other long term (current) drug therapy: Secondary | ICD-10-CM | POA: Insufficient documentation

## 2015-01-21 DIAGNOSIS — K219 Gastro-esophageal reflux disease without esophagitis: Secondary | ICD-10-CM | POA: Diagnosis not present

## 2015-01-21 DIAGNOSIS — Z87891 Personal history of nicotine dependence: Secondary | ICD-10-CM | POA: Insufficient documentation

## 2015-01-21 DIAGNOSIS — H9190 Unspecified hearing loss, unspecified ear: Secondary | ICD-10-CM | POA: Diagnosis not present

## 2015-01-21 DIAGNOSIS — F319 Bipolar disorder, unspecified: Secondary | ICD-10-CM | POA: Diagnosis not present

## 2015-01-21 DIAGNOSIS — J45909 Unspecified asthma, uncomplicated: Secondary | ICD-10-CM | POA: Diagnosis not present

## 2015-01-21 DIAGNOSIS — F909 Attention-deficit hyperactivity disorder, unspecified type: Secondary | ICD-10-CM | POA: Diagnosis not present

## 2015-01-21 DIAGNOSIS — M25511 Pain in right shoulder: Secondary | ICD-10-CM | POA: Diagnosis present

## 2015-01-21 MED ORDER — CYCLOBENZAPRINE HCL 10 MG PO TABS: 10.0000 mg | ORAL_TABLET | Freq: Three times a day (TID) | ORAL | Status: DC | PRN

## 2015-01-21 MED ORDER — CYCLOBENZAPRINE HCL 10 MG PO TABS
10.0000 mg | ORAL_TABLET | Freq: Once | ORAL | Status: AC
  Administered 2015-01-21: 10 mg via ORAL
  Filled 2015-01-21: qty 1

## 2015-01-21 MED ORDER — HYDROCODONE-ACETAMINOPHEN 5-325 MG PO TABS: ORAL_TABLET | ORAL | Status: DC

## 2015-01-21 MED ORDER — HYDROCODONE-ACETAMINOPHEN 5-325 MG PO TABS
1.0000 | ORAL_TABLET | Freq: Once | ORAL | Status: AC
  Administered 2015-01-21: 1 via ORAL
  Filled 2015-01-21: qty 1

## 2015-01-21 NOTE — Discharge Instructions (Signed)
Tendinitis  Tendinitis is redness, soreness, and puffiness (inflammation) of the tendons. Tendons are band-like tissues that connect muscle to bone. Tendinitis often happens in the shoulders, heels, or elbows. It might happen if your job involves doing the same motions over and over. HOME CARE  Use a sling or splint as told by your doctor.  Put ice on the injured area.  Put ice in a plastic bag.  Place a towel between your skin and the bag.  Leave the ice on for 15-20 minutes, 03-04 times a day.  Avoid using your injured arm or leg until the pain goes away.  Do gentle exercises only as told by your doctor. Stop exercises if the pain gets worse, unless your doctor tells you otherwise.  Only take medicines as told by your doctor. GET HELP RIGHT AWAY IF:  Your pain and puffiness get worse.  You have new problems, such as loss of feeling (numbness) in the hands. MAKE SURE YOU:  Understand these instructions.  Will watch your condition.  Will get help right away if you are not doing well or get worse. Document Released: 09/05/2010 Document Revised: 08/18/2011 Document Reviewed: 09/05/2010 [facility] Patient Information 2015 [place], [place]. This information is not intended to replace advice given to you by your health care provider. Make sure you discuss any questions you have with your health care provider.

## 2015-01-21 NOTE — ED Notes (Signed)
Pt states he woke up this morning with right shoulder pain. Taken tylenol today with no relief. No injury that pt is aware of.

## 2015-01-23 NOTE — ED Provider Notes (Signed)
CSN: [id number]     Arrival date & time 01/21/15  2004 History   First MD Initiated Contact with Patient 01/21/15 2018     Chief Complaint  Patient presents with  . Shoulder Pain     (Consider location/radiation/quality/duration/timing/severity/associated sxs/prior Treatment) HPI   Wayne Avila is a 24 y.o. male who presents to the Emergency Department complaining of sudden onset of right shoulder pain.  Pain began on the morning of ED arrival.  Pain to the shoulder with movement, improves at rest.  He has taken tylenol without relief.  He reports frequent use of the arm, but denies injury.  He also denies neck or chest pain, fever, chills, numbness or weakness of the arm.     Past Medical History  Diagnosis Date  . Asthma   . ADHD (attention deficit hyperactivity disorder)   . Bipolar 1 disorder   . GERD (gastroesophageal reflux disease)   . Depression   . Anxiety   . Mental developmental delay 11/15/2012  . PONV (postoperative nausea and vomiting)   . Hearing loss   . HOH (hard of hearing)   . Abdominal pain, chronic, right upper quadrant   . Colitis   . Seizures    Past Surgical History  Procedure Laterality Date  . Tonsillectomy    . Adenoidectomy    . Esophagogastroduodenoscopy (egd) with propofol N/A 12/07/2012    SLF:The mucosa of the esophagus appeared normal Non-erosive gastritis (inflammation) was found in the gastric antrum; multiple biopsies The duodenal mucosa showed no abnormalities in the bulb and second portion of the duodenum  . Esophageal biopsy N/A 12/07/2012    Procedure: GASTRIC BIOPSIES;  Surgeon: [name], MD;  Location: [facility];  Service: Endoscopy;  Laterality: N/A;  . Colonoscopy with propofol N/A 08/30/2013    GYI:RSWNIO mucosa in the terminal ileum/COLITIS/ MILD PROCTITIS. 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  . Esophageal biopsy N/A  08/30/2013    Procedure: BIOPSY;  Surgeon: [name], MD;  Location: [facility];  Service: Endoscopy;  Laterality: N/A;  right colon,transverse colon, left colon, rectal biopsies   Family History  Problem Relation Age of Onset  . Asthma Mother   . Ulcers Mother   . Bipolar disorder Mother   . Colon cancer Neg Hx   . Liver disease Neg Hx   . ADD / ADHD Father   . Diabetes Maternal Grandmother   . Diabetes Maternal Grandfather    Social History  Substance Use Topics  . Smoking status: Former Smoker -- 0.00 packs/day for 2 years    Types: Cigarettes    Quit date: 10/06/2012  . Smokeless tobacco: Never Used  . Alcohol Use: No     Comment: denies usage    Review of Systems  Constitutional: Negative for fever and chills.  Respiratory: Negative for shortness of breath.   Cardiovascular: Negative for chest pain.  Genitourinary: Negative for dysuria and difficulty urinating.  Musculoskeletal: Positive for arthralgias (right shoulder pain). Negative for joint swelling and neck pain.  Skin: Negative for color change and wound.  All other systems reviewed and are negative.     Allergies  Amoxicillin-pot clavulanate; Omeprazole; Pineapple; Strawberry; and Tomato  Home Medications   Prior to Admission medications   Medication Sig Start Date End Date Taking? Authorizing Provider  acetaminophen (TYLENOL) 500 MG tablet Take 1,000 mg by mouth every 6 (six) hours as needed  for mild pain.   Yes Historical Provider, MD  albuterol (PROAIR HFA) 108 (90 BASE) MCG/ACT inhaler Inhale 2 puffs into the lungs every 4 (four) hours as needed for wheezing. Persistent dry cough 09/21/13  Yes Alethea Johnathan Hausen, MD  atomoxetine (STRATTERA) 100 MG capsule Take 1 capsule (100 mg total) by mouth every morning. Patient taking differently: Take 100 mg by mouth every morning. Takes with the 100 mg to make 125 mg daily 11/13/14  Yes [name], NP  atomoxetine (STRATTERA) 25 MG capsule Take 25 mg by mouth  daily. Takes with the 100 mg to make 125 mg daily   Yes Historical Provider, MD  divalproex (DEPAKOTE) 500 MG DR tablet Take 1 tablet (500 mg total) by mouth 2 (two) times daily. 11/13/14  Yes [name], NP  loratadine (CLARITIN) 10 MG tablet Take 10 mg by mouth at bedtime.    Yes Historical Provider, MD  mesalamine (APRISO) 0.375 G 24 hr capsule Take 4 capsules (1.5 g total) by mouth daily. 11/13/14  Yes [name], NP  methylphenidate 18 MG PO CR tablet Take 1 tablet (18 mg total) by mouth every morning. For improved attention. 11/14/14  Yes [name], NP  montelukast (SINGULAIR) 10 MG tablet Take 1 tablet (10 mg total) by mouth at bedtime. 11/13/14  Yes [name], NP  ranitidine (ZANTAC) 150 MG tablet Take 1 tablet (150 mg total) by mouth daily. 11/13/14  Yes [name], NP  sertraline (ZOLOFT) 100 MG tablet Take 1 tablet (100 mg total) by mouth daily. 11/13/14  Yes [name], NP  traZODone (DESYREL) 100 MG tablet Take 1 tablet (100 mg total) by mouth at bedtime as needed for sleep (May repeat X1). Patient taking differently: Take 100 mg by mouth at bedtime. May repeat a dose if necessary 11/13/14  Yes [name], NP  cyclobenzaprine (FLEXERIL) 10 MG tablet Take 1 tablet (10 mg total) by mouth 3 (three) times daily as needed. 01/21/15   Dillie Burandt, PA-C  EPINEPHrine 0.3 mg/0.3 mL IJ SOAJ injection Inject 0.3 mg into the muscle once.    Historical Provider, MD  fluticasone (FLONASE) 50 MCG/ACT nasal spray Place 1 spray into both nostrils daily. Patient taking differently: Place 1 spray into both nostrils daily as needed for allergies.  11/13/14   [name], NP  HYDROcodone-acetaminophen (NORCO/VICODIN) 5-325 MG per tablet Take one-two tabs po q 4-6 hrs prn pain 01/21/15   Joci Dress, PA-C  ibuprofen (ADVIL,MOTRIN) 800 MG tablet Take 1 tablet (800 mg total) by mouth 3 (three) times daily. 11/13/14   [name], NP   BP 147/85 mmHg  Pulse 91  Temp(Src) 98.7 F (37.1 C) (Oral)   Resp 16  SpO2 98% Physical Exam  Constitutional: He is oriented to person, place, and time. He appears well-developed and well-nourished. No distress.  HENT:  Head: Normocephalic and atraumatic.  Neck: Normal range of motion. Neck supple. No thyromegaly present.  Cardiovascular: Normal rate, regular rhythm, normal heart sounds and intact distal pulses.   No murmur heard. Pulmonary/Chest: Effort normal and breath sounds normal. No respiratory distress. He exhibits no tenderness.  Musculoskeletal: He exhibits tenderness. He exhibits no edema.  ttp of the anterior right shoulder.  Pain with abduction of the right arm and rotation of the shoulder.  Radial pulse is brisk, distal sensation intact, CR< 2 sec. Grip strength is strong and symmetrical.   No abrasions, edema , erythema or step-off  deformity of the joint.   Lymphadenopathy:    He has no cervical adenopathy.  Neurological: He is alert and oriented to person, place, and time. He has normal strength. No sensory deficit. He exhibits normal muscle tone. Coordination normal.  Skin: Skin is warm and dry.  Nursing note and vitals reviewed.   ED Course  Procedures (including critical care time) Labs Review Labs Reviewed - No data to display  Imaging Review Dg Shoulder Right  01/21/2015   CLINICAL DATA:  Right shoulder pain, no known injury, initial encounter  EXAM: RIGHT SHOULDER - 2+ VIEW  COMPARISON:  None.  FINDINGS: There is no evidence of fracture or dislocation. There is no evidence of arthropathy or other focal bone abnormality. Soft tissues are unremarkable.  IMPRESSION: No acute abnormality noted.   Electronically Signed   By: Inez Catalina M.D.   On: 01/21/2015 21:48   I have personally reviewed and evaluated these images and lab results as part of my medical decision-making.   EKG Interpretation None      MDM   Final diagnoses:  Bursitis, shoulder, right    Pt with likely inflammatory bursitis to th right shoulder.   NV intact.  No concerning sx's for septic joint.  Agrees to symptomatic tx and orthopedic f/u.  Appears stable for d/c    Kem Parkinson, PA-C 01/23/15 2316  Daleen Bo, MD 01/24/15 [phone]

## 2015-02-21 ENCOUNTER — Ambulatory Visit: Payer: Medicaid Other | Admitting: Gastroenterology

## 2015-03-21 ENCOUNTER — Other Ambulatory Visit: Payer: Self-pay | Admitting: Diagnostic Neuroimaging

## 2015-03-21 NOTE — Telephone Encounter (Signed)
PLAN: - continue divalproex 540m BID for mood stabilization; will ask this be refilled through [facility]

## 2015-04-23 ENCOUNTER — Encounter: Payer: Self-pay | Admitting: Gastroenterology

## 2015-04-23 ENCOUNTER — Ambulatory Visit (INDEPENDENT_AMBULATORY_CARE_PROVIDER_SITE_OTHER): Payer: Medicaid Other | Admitting: Gastroenterology

## 2015-04-23 VITALS — BP 158/95 | HR 102 | Temp 98.0°F | Ht 67.0 in | Wt 220.4 lb

## 2015-04-23 DIAGNOSIS — K5190 Ulcerative colitis, unspecified, without complications: Secondary | ICD-10-CM | POA: Diagnosis not present

## 2015-04-23 LAB — COMPLETE METABOLIC PANEL WITH GFR
ALT: 13 U/L (ref 9–46)
AST: 18 U/L (ref 10–40)
Albumin: 4 g/dL (ref 3.6–5.1)
Alkaline Phosphatase: 53 U/L (ref 40–115)
BUN: 9 mg/dL (ref 7–25)
CO2: 23 mmol/L (ref 20–31)
Calcium: 9.1 mg/dL (ref 8.6–10.3)
Chloride: 105 mmol/L (ref 98–110)
Creat: 0.92 mg/dL (ref 0.60–1.35)
GFR, Est African American: >89 mL/min (ref >=60)
GFR, Est Non African American: >89 mL/min (ref >=60)
Glucose, Bld: 98 mg/dL (ref 65–99)
Potassium: 3.9 mmol/L (ref 3.5–5.3)
Sodium: 139 mmol/L (ref 135–146)
Total Bilirubin: 0.5 mg/dL (ref 0.2–1.2)
Total Protein: 6.7 g/dL (ref 6.1–8.1)

## 2015-04-23 LAB — CBC
HCT: 41.7 % (ref 39.0–52.0)
Hemoglobin: 14.2 g/dL (ref 13.0–17.0)
MCH: 28.7 pg (ref 26.0–34.0)
MCHC: 34.1 g/dL (ref 30.0–36.0)
MCV: 84.4 fL (ref 78.0–100.0)
MPV: 11.1 fL (ref 8.6–12.4)
Platelets: 228 10*3/uL (ref 150–400)
RBC: 4.94 MIL/uL (ref 4.22–5.81)
RDW: 13.6 % (ref 11.5–15.5)
WBC: 5.7 10*3/uL (ref 4.0–10.5)

## 2015-04-23 LAB — C-REACTIVE PROTEIN: CRP: <0.5 mg/dL (ref <0.60)

## 2015-04-23 NOTE — Patient Instructions (Signed)
Please have blood work done.  Please call me if any further bleeding or evidence of diarrhea.   We will see you in 3 months!

## 2015-04-23 NOTE — Progress Notes (Signed)
Referring Provider: Pa, [facility] Primary Care Physician:  [facility]  Primary GI: Dr. [name]   Chief Complaint  Patient presents with  . Rectal Bleeding    HPI:   Wayne Avila is a 24 y.o. male presenting today with a history of history of likely UC, diagnosed March 2015, on Apriso. 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 Biopsies could not delineate between Crohn's and ulcerative colitis. [facility] lab test ordered and c/w UC per Dr. Nona Dell note. Maintained on Apriso.   Scant amount of blood on tissue yesterday. Abdominal pain last Thursday. Had RLQ discomfort last Thursday. Resolved. Had loose stool yesterday. Has a BM about 3 times per day. Comes out like icecream. Poor historian.   Past Medical History  Diagnosis Date  . Asthma   . ADHD (attention deficit hyperactivity disorder)   . Bipolar 1 disorder ([place])   . GERD (gastroesophageal reflux disease)   . Depression   . Anxiety   . Mental developmental delay 11/15/2012  . PONV (postoperative nausea and vomiting)   . Hearing loss   . HOH (hard of hearing)   . Abdominal pain, chronic, right upper quadrant   . Colitis   . Seizures [facility])     Past Surgical History  Procedure Laterality Date  . Tonsillectomy    . Adenoidectomy    . Esophagogastroduodenoscopy (egd) with propofol N/A 12/07/2012    SLF:The mucosa of the esophagus appeared normal Non-erosive gastritis (inflammation) was found in the gastric antrum; multiple biopsies The duodenal mucosa showed no abnormalities in the bulb and second portion of the duodenum  . Esophageal biopsy N/A 12/07/2012    Procedure: GASTRIC BIOPSIES;  Surgeon: [name], MD;  Location: [facility];  Service: Endoscopy;  Laterality: N/A;  . Colonoscopy with propofol N/A 08/30/2013    SAY:TKZSWF mucosa in the terminal ileum/COLITIS/ MILD PROCTITIS. Biopsies showed patchy chronic active colitis of the right colon and rectum, overall  findings most consistent with idiopathic inflammatory bowel disease.  . Esophageal biopsy N/A 08/30/2013    Procedure: BIOPSY;  Surgeon: [name], MD;  Location: [facility];  Service: Endoscopy;  Laterality: N/A;  right colon,transverse colon, left colon, rectal biopsies    Current Outpatient Prescriptions  Medication Sig Dispense Refill  . acetaminophen (TYLENOL) 500 MG tablet Take 1,000 mg by mouth every 6 (six) hours as needed for mild pain.    Marland Kitchen albuterol (PROAIR HFA) 108 (90 BASE) MCG/ACT inhaler Inhale 2 puffs into the lungs every 4 (four) hours as needed for wheezing. Persistent dry cough 6.7 g 2  . atomoxetine (STRATTERA) 100 MG capsule Take 1 capsule (100 mg total) by mouth every morning. (Patient taking differently: Take 100 mg by mouth every morning. Takes with the 100 mg to make 125 mg daily) 30 capsule 0  . atomoxetine (STRATTERA) 25 MG capsule Take 25 mg by mouth daily. Takes with the 100 mg to make 125 mg daily    . cyclobenzaprine (FLEXERIL) 10 MG tablet Take 1 tablet (10 mg total) by mouth 3 (three) times daily as needed. 21 tablet 0  . divalproex (DEPAKOTE) 500 MG DR tablet Take 1 tablet (500 mg total) by mouth 2 (two) times daily. 60 tablet 0  . EPINEPHrine 0.3 mg/0.3 mL IJ SOAJ injection Inject 0.3 mg into the muscle once.    . fluticasone (FLONASE) 50 MCG/ACT nasal spray Place 1 spray into both nostrils daily. (Patient taking differently: Place 1 spray  into both nostrils daily as needed for allergies. )  5  . HYDROcodone-acetaminophen (NORCO/VICODIN) 5-325 MG per tablet Take one-two tabs po q 4-6 hrs prn pain 15 tablet 0  . ibuprofen (ADVIL,MOTRIN) 800 MG tablet Take 1 tablet (800 mg total) by mouth 3 (three) times daily. 21 tablet 0  . loratadine (CLARITIN) 10 MG tablet Take 10 mg by mouth at bedtime.     . mesalamine (APRISO) 0.375 G 24 hr capsule Take 4 capsules (1.5 g total) by mouth daily. 124 capsule 11  . methylphenidate 18 MG PO CR tablet Take 1 tablet (18 mg total)  by mouth every morning. For improved attention. 30 tablet 0  . montelukast (SINGULAIR) 10 MG tablet Take 1 tablet (10 mg total) by mouth at bedtime. 30 tablet 3  . ranitidine (ZANTAC) 150 MG tablet Take 1 tablet (150 mg total) by mouth daily.    . sertraline (ZOLOFT) 100 MG tablet Take 1 tablet (100 mg total) by mouth daily. 30 tablet 0  . traZODone (DESYREL) 100 MG tablet Take 1 tablet (100 mg total) by mouth at bedtime as needed for sleep (May repeat X1). (Patient taking differently: Take 100 mg by mouth at bedtime. May repeat a dose if necessary) 30 tablet 0   No current facility-administered medications for this visit.    Allergies as of 04/23/2015 - Review Complete 04/23/2015  Allergen Reaction Noted  . Amoxicillin-pot clavulanate Nausea And Vomiting 02/28/2011  . Omeprazole Nausea And Vomiting 04/20/2013  . Pineapple Swelling 04/14/2011  . Strawberry extract Swelling 05/08/2012  . Tomato Rash 04/14/2011    Family History  Problem Relation Age of Onset  . Asthma Mother   . Ulcers Mother   . Bipolar disorder Mother   . Colon cancer Neg Hx   . Liver disease Neg Hx   . ADD / ADHD Father   . Diabetes Maternal Grandmother   . Diabetes Maternal Grandfather     Social History   Social History  . Marital Status: Single    Spouse Name: N/A  . Number of Children: 0  . Years of Education: 12th   Occupational History  .      not working   Social History Main Topics  . Smoking status: Former Smoker -- 0.00 packs/day for 2 years    Types: Cigarettes    Quit date: 10/06/2012  . Smokeless tobacco: Never Used  . Alcohol Use: No     Comment: denies usage  . Drug Use: No  . Sexual Activity: Yes   Other Topics Concern  . None   Social History Narrative   Patient lives at home with girlfriend and family.   Caffeine Use: occasionally    Review of Systems: As mentioned in HPI   Physical Exam: BP 158/95 mmHg  Pulse 102  Temp(Src) 98 F (36.7 C) (Oral)  Ht 5' 7"  (1.702  m)  Wt 220 lb 6.4 oz (99.973 kg)  BMI 34.51 kg/m2 General:   Alert and oriented. No distress noted. Pleasant and cooperative.  Head:  Normocephalic and atraumatic. Eyes:  Conjuctiva clear without scleral icterus. Mouth:  Oral mucosa pink and moist. Good dentition. No lesions. Abdomen:  +BS, soft, non-tender and non-distended. No rebound or guarding. No HSM or masses noted. Msk:  Symmetrical without gross deformities. Normal posture. Extremities:  Without edema. Neurologic:  Alert and  oriented x4;  grossly normal neurologically. Psych:  Alert and cooperative. Normal mood and affect.

## 2015-04-24 LAB — SEDIMENTATION RATE: Sed Rate: 1 mm/hr (ref 0–15)

## 2015-04-27 NOTE — Assessment & Plan Note (Signed)
24 year old male with likely UC, overall has done well with Apriso. Poor historian, noting possible scant rectal bleeding and abdominal pain recently and one episode of loose stool, otherwise at baseline. No concerning physical exam findings. Will check CBC, CMP, CRP, sed rate. Doubt dealing with a flare. If persistent symptoms, consider Canasa suppositories and/or Uceris if diarrrhea/abdominal pain/rectal bleeding. Close follow-up in 3 months. Patient to call with any continued symptoms. At time of appt doing well.

## 2015-04-30 NOTE — Progress Notes (Signed)
Quick Note:  CBC, CMP, sed rate, CRP all normal. ______

## 2015-04-30 NOTE — Progress Notes (Signed)
CC'D TO PCP °

## 2015-05-01 NOTE — Progress Notes (Signed)
Quick Note:  LMOM that all labs were normal. ______

## 2015-05-01 NOTE — Progress Notes (Signed)
Quick Note:  LMOM that labs are normal. ______

## 2015-06-20 ENCOUNTER — Telehealth: Payer: Self-pay | Admitting: Diagnostic Neuroimaging

## 2015-06-20 NOTE — Telephone Encounter (Signed)
Spoke with Silva Bandy at [facility] who stated patient's mother came into their facility today requesting Dr Maryagnes Amos prescribe Depakote per this office's instructions. Silva Bandy stated Dr Tamala Julian will not fill or manage the medication because he is already prescribing mood stabilizing medications for Mr Mccamy and Depakote is for seizures. Will route to Dr Leta Baptist for reply to patient's mother's request.

## 2015-06-20 NOTE — Telephone Encounter (Signed)
Pt's mother called and states the [facility] will not refill depakote for the pt. Mother would like to know if the pt should stay on medication. Last office notes states that this office would see if [facility] would continue to refill it. Please call and advise [phone]

## 2015-06-20 NOTE — Telephone Encounter (Signed)
i called mother; no answer. i had tried depakote to help mood and possible seizures. Now i do not think patient has seizure disorder. Psychiatry in the hospital had continued depakote for bipolar. If current outpatient psychiatry do not feel it is necessary to help with mood, then we may taper it off. pls arange phone call with me and Dr. Tamala Julian to discuss med mgmt. -VRP

## 2015-06-22 NOTE — Telephone Encounter (Signed)
Per Dr Leta Baptist, spoke with patient's mother who states her son has been taking Depakote. When this RN inquired who has been prescribing it, she stated "I take the same medicine so that's how." Advised her that Dr Leta Baptist needs to see patient, get updated medication list and discuss with her and her son about continuing medication or tapering off. Scheduled FU with her and requested she and son arrive 15 min early, bring complete medication list with doses and how often he takes. She verbalized understanding, appreciation for call.

## 2015-07-03 ENCOUNTER — Encounter: Payer: Self-pay | Admitting: Diagnostic Neuroimaging

## 2015-07-03 ENCOUNTER — Ambulatory Visit (INDEPENDENT_AMBULATORY_CARE_PROVIDER_SITE_OTHER): Payer: Medicaid Other | Admitting: Diagnostic Neuroimaging

## 2015-07-03 VITALS — BP 124/89 | HR 93 | Ht 67.0 in | Wt 227.0 lb

## 2015-07-03 DIAGNOSIS — F331 Major depressive disorder, recurrent, moderate: Secondary | ICD-10-CM | POA: Diagnosis not present

## 2015-07-03 DIAGNOSIS — F3130 Bipolar disorder, current episode depressed, mild or moderate severity, unspecified: Secondary | ICD-10-CM | POA: Diagnosis not present

## 2015-07-03 DIAGNOSIS — R6250 Unspecified lack of expected normal physiological development in childhood: Secondary | ICD-10-CM | POA: Diagnosis not present

## 2015-07-03 DIAGNOSIS — F819 Developmental disorder of scholastic skills, unspecified: Secondary | ICD-10-CM

## 2015-07-03 MED ORDER — DIVALPROEX SODIUM 500 MG PO DR TAB: 500.0000 mg | DELAYED_RELEASE_TABLET | Freq: Two times a day (BID) | ORAL | Status: DC

## 2015-07-03 NOTE — Progress Notes (Signed)
[facility]  PATIENT: Wayne Avila DOB: [birth year]  REFERRING CLINICIAN:  HISTORY FROM: patient and mother REASON FOR VISIT: follow up    HISTORICAL  CHIEF COMPLAINT:  Chief Complaint  Patient presents with  . Convulsions    rm 6, other Tamesha, no seizure activity in past year  . Follow-up    1 year    HISTORY OF PRESENT ILLNESS:   UPDATE 07/03/15: Since last visit, no seizures. He has had more prob with mood and depression and suicidal thoughts. Was in hosp several times. At [facility], apparently his current psychiatrist did not want to refill depakote. Therefore, patient and mother come in today to request refills until he can establish with a new psychiatrist to manage meds.   UPDATE 06/07/14: Patient very upset today, was on the phone during intake by clinical assistant, swearing. By the time I came in the room, he is sitting on exam table, fuming mad, eyes closed, rubbing hands in a fist making motion, gritting teeth, visibly upset and angry. He does not answer my questions or make eye contact. Mother is sitting in chair answering questions. His mood apparently had been better with depakote. He has had 2 more "seizure" events in last 2 weeks, out of sleep.   UPDATE 03/08/14: Since last visit, went to [place] (July 2015), 2-3 days, no events occurred, no epileptiform discharges noted. This past Monday evening, went to sleep around 10pm, then around 11pm had convulsions during sleep. Mother noted eyes rolled back, whole body stiffness and convulsions. EMS called, and patient had second seizure/event. Went to ER. Continued fatigue today.   UPDATE 09/28/13: Since last visit, has had 2 more events. 1 was falling out of chair, with convulsions, and rapid return to consciousness. No triggers. Another event last Sunday after going there for abdominal pain, laying on exam table, brief LOC (1 minute), without convulsions. Mom witnessed, but didn't mention to ER  staff.   UPDATE 08/03/13: Since last visit, had MRI and EEG (both normal). No further witnessed convulsions or seizures. On 08/01/13, patient went to sleep in the evening. The only thing he ate that day was 2 egg/cheese biscuits. Around 2am, he work up not feeling well. He was "shaky" and confused. Family check his blood sugar at home (mother and grandmother are diabetic) and his reading was in the 26's. Patient was taken to the emergency room and evaluated. Blood sugar in the emergency room was 120. Since that time patient was continued to be fatigued and sleepy. Patient has been having increased thirst as well as weight loss over the past one year. No official diagnosis of diabetes.  PRIOR HPI (06/29/13): 25 year old male here for evaluation of seizures. February 2014 patient had first episode of possible seizure when he was at home, in the middle of an argument and dramatic event when a girl showed up at his home claiming that she had no place to go and needed to move in. Apparently she and the patient had gone out on a date one time previously. There was a dramatic scene, and the patient's family had to call the police to have her leave. While waiting for the police, patient began to stare, then lose consciousness, have convulsions for several minutes. He was incontinent of urine. Apparently he stopped breathing. His eyes were closed and he had stiffness in his muscles. This was witnessed by patient's mother. Patient was taken by ambulance to [facility] and diagnosed with an apparent stress  reaction. After coming home patient was noted to be spitting up blood. He was taken to [facility] next day and diagnosed with possible gastric ulcer. 06/10/13, patient was in a car with his grandmother and girlfriend, when the grandmother noticed that his breathing changed. Apparently his eyes rolled back and he lost consciousness. Patient's mother was inside of the store at this time. Earlier in the day  patient had gotten into a very heated argument and was very upset. When patient's mother came back to the car, she called 911. Patient was taken to the hospital for evaluation. Patient was discharged with recommendation for close neurologic followup. In the past patient has never had any other episodes of loss of consciousness. He has had multiple episodes of "staring spells" starting at age 53-79 years old, continuing throughout his life. Some of these episodes occur when he is upset. Some of these episodes occur without any trigger. They have been occurring at least once per month. Some of these episodes are able to be terminated by tapping patient on the shoulder. Other episodes cannot be terminated by tapping the patient shoulder. Patient is continuing to have psychiatry/psychology followup at [facility]. Review of prior hospital records indicate history of being bullied at school, prior suicidal thoughts and emotional trauma.  Birth/developmental history: Patient was born full-term ([redacted] weeks gestational age, 8 pounds weight, normal delivery without complication) with normal development until kindergarten. He talked with a few words starting at 5 or 6 months and started walking around 10 and half months. He had frequent ear infections with subsequent mild hearing loss. In kindergarten he had some difficulty and was held back. During his second try of kindergarten he was then diagnosed with hearing loss and possible ADHD. Age 88 years old he was diagnosed with bipolar disorder. He had some difficulty to school and in sixth grade through 12th grade attended some special education classes as well as traditional classes. He was able to graduate high school with a diploma as well as make the honor roll several times.  REVIEW OF SYSTEMS: Full 14 system review of systems performed and notable only for excess sweating hearing loss runny nose behavior problem depression hyperactive seizure env allergies  cough.    ALLERGIES: Allergies  Allergen Reactions  . Amoxicillin-Pot Clavulanate Nausea And Vomiting  . Ibuprofen Other (See Comments)    Patient has ulcerative colitis  . Omeprazole Nausea And Vomiting  . Pineapple Swelling  . Strawberry Extract Swelling  . Tomato Rash    HOME MEDICATIONS: Outpatient Prescriptions Prior to Visit  Medication Sig Dispense Refill  . albuterol (PROAIR HFA) 108 (90 BASE) MCG/ACT inhaler Inhale 2 puffs into the lungs every 4 (four) hours as needed for wheezing. Persistent dry cough 6.7 g 2  . atomoxetine (STRATTERA) 100 MG capsule Take 1 capsule (100 mg total) by mouth every morning. (Patient taking differently: Take 100 mg by mouth every morning. Takes with the 100 mg to make 125 mg daily) 30 capsule 0  . atomoxetine (STRATTERA) 25 MG capsule Take 25 mg by mouth daily. Takes with the 100 mg to make 125 mg daily    . EPINEPHrine 0.3 mg/0.3 mL IJ SOAJ injection Inject 0.3 mg into the muscle once.    . fluticasone (FLONASE) 50 MCG/ACT nasal spray Place 1 spray into both nostrils daily. (Patient taking differently: Place 1 spray into both nostrils daily as needed for allergies. )  5  . loratadine (CLARITIN) 10 MG tablet Take 10 mg  by mouth at bedtime.     . mesalamine (APRISO) 0.375 G 24 hr capsule Take 4 capsules (1.5 g total) by mouth daily. 124 capsule 11  . montelukast (SINGULAIR) 10 MG tablet Take 1 tablet (10 mg total) by mouth at bedtime. 30 tablet 3  . ranitidine (ZANTAC) 150 MG tablet Take 1 tablet (150 mg total) by mouth daily.    . sertraline (ZOLOFT) 100 MG tablet Take 1 tablet (100 mg total) by mouth daily. 30 tablet 0  . traZODone (DESYREL) 100 MG tablet Take 1 tablet (100 mg total) by mouth at bedtime as needed for sleep (May repeat X1). (Patient taking differently: Take 100 mg by mouth at bedtime. May repeat a dose if necessary) 30 tablet 0  . acetaminophen (TYLENOL) 500 MG tablet Take 1,000 mg by mouth every 6 (six) hours as needed for mild  pain.    . cyclobenzaprine (FLEXERIL) 10 MG tablet Take 1 tablet (10 mg total) by mouth 3 (three) times daily as needed. 21 tablet 0  . divalproex (DEPAKOTE) 500 MG DR tablet Take 1 tablet (500 mg total) by mouth 2 (two) times daily. 60 tablet 0  . HYDROcodone-acetaminophen (NORCO/VICODIN) 5-325 MG per tablet Take one-two tabs po q 4-6 hrs prn pain 15 tablet 0  . ibuprofen (ADVIL,MOTRIN) 800 MG tablet Take 1 tablet (800 mg total) by mouth 3 (three) times daily. 21 tablet 0  . methylphenidate 18 MG PO CR tablet Take 1 tablet (18 mg total) by mouth every morning. For improved attention. 30 tablet 0   No facility-administered medications prior to visit.    PAST MEDICAL HISTORY: Past Medical History  Diagnosis Date  . Asthma   . ADHD (attention deficit hyperactivity disorder)   . Bipolar 1 disorder ([place])   . GERD (gastroesophageal reflux disease)   . Depression   . Anxiety   . Mental developmental delay 11/15/2012  . PONV (postoperative nausea and vomiting)   . Hearing loss   . HOH (hard of hearing)   . Abdominal pain, chronic, right upper quadrant   . Colitis   . Seizures ([place])     PAST SURGICAL HISTORY: Past Surgical History  Procedure Laterality Date  . Tonsillectomy    . Adenoidectomy    . Esophagogastroduodenoscopy (egd) with propofol N/A 12/07/2012    SLF:The mucosa of the esophagus appeared normal Non-erosive gastritis (inflammation) was found in the gastric antrum; multiple biopsies The duodenal mucosa showed no abnormalities in the bulb and second portion of the duodenum  . Esophageal biopsy N/A 12/07/2012    Procedure: GASTRIC BIOPSIES;  Surgeon: [name], MD;  Location: [facility];  Service: Endoscopy;  Laterality: N/A;  . Colonoscopy with propofol N/A 08/30/2013    SAY:TKZSWF mucosa in the terminal ileum/COLITIS/ MILD PROCTITIS. Biopsies showed patchy chronic active colitis of the right colon and rectum, overall findings most consistent with idiopathic inflammatory bowel  disease.  . Esophageal biopsy N/A 08/30/2013    Procedure: BIOPSY;  Surgeon: [name], MD;  Location: [facility];  Service: Endoscopy;  Laterality: N/A;  right colon,transverse colon, left colon, rectal biopsies    FAMILY HISTORY: Family History  Problem Relation Age of Onset  . Asthma Mother   . Ulcers Mother   . Bipolar disorder Mother   . Colon cancer Neg Hx   . Liver disease Neg Hx   . ADD / ADHD Father   . Diabetes Maternal Grandmother   . Diabetes Maternal Grandfather     SOCIAL HISTORY:  Social History   Social History  . Marital Status: Single    Spouse Name: N/A  . Number of Children: 0  . Years of Education: 12th   Occupational History  .      not working   Social History Main Topics  . Smoking status: Former Smoker -- 0.00 packs/day for 2 years    Types: Cigarettes    Quit date: 10/06/2012  . Smokeless tobacco: Never Used  . Alcohol Use: No     Comment: denies usage  . Drug Use: No  . Sexual Activity: Yes   Other Topics Concern  . Not on file   Social History Narrative   Patient lives at home with girlfriend and family.   Caffeine Use: occasionally     PHYSICAL EXAM  Filed Vitals:   07/03/15 1125  BP: 124/89  Pulse: 93  Height: 5' 7"  (1.702 m)  Weight: 227 lb (102.967 kg)    Not recorded      Body mass index is 35.55 kg/(m^2).   GENERAL EXAM: CALM, COOPERATIVE, USING SMARTPHONE, DECR EYE CONTACT  CARDIOVASCULAR: Regular rate and rhythm, no murmurs, no carotid bruits  NEUROLOGIC: MENTAL STATUS: awake, alert, language fluent, comprehension intact, naming intact, fund of knowledge appropriate CRANIAL NERVE: pupils equal and reactive to light, visual fields full to confrontation, extraocular muscles intact, no nystagmus, facial sensation and strength symmetric, hearing intact, palate elevates symmetrically, uvula midline, shoulder shrug symmetric, tongue midline. MOTOR: normal bulk and tone, full strength in the BUE, BLE SENSORY:  normal and symmetric to light touch, temperature, vibration COORDINATION: finger-nose-finger, fine finger movements normal REFLEXES: deep tendon reflexes present and symmetric GAIT/STATION: narrow based gait    DIAGNOSTIC DATA (LABS, IMAGING, TESTING) - I reviewed patient records, labs, notes, testing and imaging myself where available.  Lab Results  Component Value Date   WBC 5.7 04/23/2015   HGB 14.2 04/23/2015   HCT 41.7 04/23/2015   MCV 84.4 04/23/2015   PLT 228 04/23/2015      Component Value Date/Time   NA 139 04/23/2015 1223   K 3.9 04/23/2015 1223   CL 105 04/23/2015 1223   CO2 23 04/23/2015 1223   GLUCOSE 98 04/23/2015 1223   BUN 9 04/23/2015 1223   CREATININE 0.92 04/23/2015 1223   CREATININE 1.02 11/07/2014 1743   CALCIUM 9.1 04/23/2015 1223   PROT 6.7 04/23/2015 1223   ALBUMIN 4.0 04/23/2015 1223   AST 18 04/23/2015 1223   ALT 13 04/23/2015 1223   ALKPHOS 53 04/23/2015 1223   BILITOT 0.5 04/23/2015 1223   GFRNONAA >89 04/23/2015 1223   GFRNONAA >60 11/07/2014 1743   GFRAA >89 04/23/2015 1223   GFRAA >60 11/07/2014 1743   No results found for: CHOL Lab Results  Component Value Date   HGBA1C 5.7* 08/03/2013   No results found for: [id number] Lab Results  Component Value Date   TSH 1.996 08/09/2013    I reviewed images myself and agree with interpretation.   10/20/10 CT head - normal   07/14/13 MRI brain - normal  07/21/13 EEG - normal  04/03/14 MRI brain - normal  03/30/14 EEG - normal    ASSESSMENT AND PLAN  25 y.o. year old male here with possible seizure disorder vs pseudo-seizures. MRIs, EEGs and VEEG all normal. There are significant contributing psychological and social factors/issues.   PLAN: - continue divalproex 538m BID for mood stabilization; I will refill today for 3 months until he get this refilled through psychiatry - monitor symptoms;  if seizures / pseudo-sz continue, then would recommend second opinion with academic  epilepsy center for definitive diagnosis - no driving or operating heavy machinery (pt does not have driver's license); caution with heights, swimming and other seizure precautions  Meds ordered this encounter  Medications  . divalproex (DEPAKOTE) 500 MG DR tablet    Sig: Take 1 tablet (500 mg total) by mouth 2 (two) times daily.    Dispense:  60 tablet    Refill:  2   Return if symptoms worsen or fail to improve, for return to PCP and psychiatry.    Penni Bombard, MD 7/53/0104, 04:59 AM Certified in Neurology, Neurophysiology and Neuroimaging  [facility] [address], [place], [place] [address] [phone]

## 2015-07-03 NOTE — Patient Instructions (Signed)
-   continue divalproex for mood stabilization; ask psychiatry to take over prescription managment

## 2015-07-25 ENCOUNTER — Ambulatory Visit: Payer: Self-pay | Admitting: Gastroenterology

## 2015-08-14 ENCOUNTER — Ambulatory Visit (INDEPENDENT_AMBULATORY_CARE_PROVIDER_SITE_OTHER): Payer: Medicaid Other | Admitting: Gastroenterology

## 2015-08-14 ENCOUNTER — Encounter: Payer: Self-pay | Admitting: Gastroenterology

## 2015-08-14 VITALS — BP 153/96 | HR 117 | Temp 97.3°F | Ht 67.0 in | Wt 223.4 lb

## 2015-08-14 DIAGNOSIS — K5190 Ulcerative colitis, unspecified, without complications: Secondary | ICD-10-CM

## 2015-08-14 NOTE — Progress Notes (Signed)
Referring Provider: Pa, [facility] Primary Care Physician:  [name], MD  Primary GI: Dr. [name]   Chief Complaint  Patient presents with  . Follow-up    HPI:   Wayne Avila is a 25 y.o. male presenting today with a history of likely UC, diagnosed March 2015, on Apriso. 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 Biopsies could not delineate between Crohn's and ulcerative colitis. [facility] lab test ordered and c/w UC per Dr. Nona Dell note. Maintained on Apriso.   4-5 soft stools per day. No abdominal pain. No rectal bleeding. Eating everything. No N/V. Stools are like ice cream. When waking up, he will go to the bathroom. Used to have BM once or twice a day, now "going all the time". For a couple months has been going more than normal. POOR HISTORIAN.    Past Medical History  Diagnosis Date  . Asthma   . ADHD (attention deficit hyperactivity disorder)   . Bipolar 1 disorder ([place])   . GERD (gastroesophageal reflux disease)   . Depression   . Anxiety   . Mental developmental delay 11/15/2012  . PONV (postoperative nausea and vomiting)   . Hearing loss   . HOH (hard of hearing)   . Abdominal pain, chronic, right upper quadrant   . Colitis   . Seizures [facility])     Past Surgical History  Procedure Laterality Date  . Tonsillectomy    . Adenoidectomy    . Esophagogastroduodenoscopy (egd) with propofol N/A 12/07/2012    SLF:The mucosa of the esophagus appeared normal Non-erosive gastritis (inflammation) was found in the gastric antrum; multiple biopsies The duodenal mucosa showed no abnormalities in the bulb and second portion of the duodenum  . Biopsy N/A 12/07/2012    Procedure: GASTRIC BIOPSIES;  Surgeon: [name], MD;  Location: [facility];  Service: Endoscopy;  Laterality: N/A;  . Colonoscopy with propofol N/A 08/30/2013    DJS:HFWYOV mucosa in the terminal ileum/COLITIS/ MILD PROCTITIS. Biopsies showed patchy  chronic active colitis of the right colon and rectum, overall findings most consistent with idiopathic inflammatory bowel disease.  Marland Kitchen Biopsy N/A 08/30/2013    Procedure: BIOPSY;  Surgeon: [name], MD;  Location: [facility];  Service: Endoscopy;  Laterality: N/A;  right colon,transverse colon, left colon, rectal biopsies    Current Outpatient Prescriptions  Medication Sig Dispense Refill  . albuterol (PROAIR HFA) 108 (90 BASE) MCG/ACT inhaler Inhale 2 puffs into the lungs every 4 (four) hours as needed for wheezing. Persistent dry cough 6.7 g 2  . atomoxetine (STRATTERA) 25 MG capsule Take 25 mg by mouth daily. Takes with the 100 mg to make 125 mg daily    . divalproex (DEPAKOTE) 500 MG DR tablet Take 1 tablet (500 mg total) by mouth 2 (two) times daily. 60 tablet 2  . EPINEPHrine 0.3 mg/0.3 mL IJ SOAJ injection Inject 0.3 mg into the muscle once.    . fluticasone (FLONASE) 50 MCG/ACT nasal spray Place 1 spray into both nostrils daily. (Patient taking differently: Place 1 spray into both nostrils daily as needed for allergies. )  5  . loratadine (CLARITIN) 10 MG tablet Take 10 mg by mouth at bedtime.     . mesalamine (APRISO) 0.375 G 24 hr capsule Take 4 capsules (1.5 g total) by mouth daily. 124 capsule 11  . montelukast (SINGULAIR) 10 MG tablet Take 1 tablet (10 mg total) by mouth at bedtime. 30 tablet  3  . ranitidine (ZANTAC) 150 MG tablet Take 1 tablet (150 mg total) by mouth daily.    . sertraline (ZOLOFT) 100 MG tablet Take 1 tablet (100 mg total) by mouth daily. 30 tablet 0   No current facility-administered medications for this visit.    Allergies as of 08/14/2015 - Review Complete 08/14/2015  Allergen Reaction Noted  . Amoxicillin-pot clavulanate Nausea And Vomiting 02/28/2011  . Ibuprofen Other (See Comments) 07/03/2015  . Omeprazole Nausea And Vomiting 04/20/2013  . Pineapple Swelling 04/14/2011  . Strawberry extract Swelling 05/08/2012  . Tomato Rash 04/14/2011    Family  History  Problem Relation Age of Onset  . Asthma Mother   . Ulcers Mother   . Bipolar disorder Mother   . Colon cancer Neg Hx   . Liver disease Neg Hx   . ADD / ADHD Father   . Diabetes Maternal Grandmother   . Diabetes Maternal Grandfather     Social History   Social History  . Marital Status: Single    Spouse Name: N/A  . Number of Children: 0  . Years of Education: 12th   Occupational History  .      not working   Social History Main Topics  . Smoking status: Former Smoker -- 0.00 packs/day for 2 years    Types: Cigarettes    Quit date: 10/06/2012  . Smokeless tobacco: Never Used  . Alcohol Use: No     Comment: denies usage  . Drug Use: No  . Sexual Activity: Yes   Other Topics Concern  . None   Social History Narrative   Patient lives at home with girlfriend and family.   Caffeine Use: occasionally    Review of Systems: As mentioned in HPI   Physical Exam: BP 153/96 mmHg  Pulse 117  Temp(Src) 97.3 F (36.3 C) (Oral)  Ht 5' 7"  (1.702 m)  Wt 223 lb 6.4 oz (101.334 kg)  BMI 34.98 kg/m2 General:   Alert and oriented. No distress noted. Pleasant and cooperative.  Head:  Normocephalic and atraumatic. Eyes:  Conjuctiva clear without scleral icterus. Mouth:  Oral mucosa pink and moist. Good dentition. No lesions. Abdomen:  +BS, soft, non-tender and non-distended. No rebound or guarding. No HSM or masses noted. Msk:  Symmetrical without gross deformities. Normal posture. Extremities:  Without edema. Neurologic:  Alert and  oriented x4;  grossly normal neurologically. Psych:  Alert and cooperative. Flat affect

## 2015-08-14 NOTE — Patient Instructions (Signed)
Please complete blood work and stool studies.  You may need steroids. Further recommendations to follow.

## 2015-08-22 ENCOUNTER — Telehealth: Payer: Self-pay | Admitting: Gastroenterology

## 2015-08-22 NOTE — Telephone Encounter (Signed)
Patient has not completed stool studies or blood work. Can we remind him? I can't move forward with a plan until this is done. Thanks!

## 2015-08-22 NOTE — Progress Notes (Signed)
I called and informed pt's mom, who comes with him to appts.

## 2015-08-22 NOTE — Assessment & Plan Note (Signed)
25 year old male with likely UC, poor historian, now noting more frequent soft stools than baseline. No abdominal pain or rectal bleeding. As he has deviated from his baseline, will check stool studies, CRP, sed rate. May need course of uceris. Further recommendations to follow.

## 2015-08-22 NOTE — Telephone Encounter (Signed)
I called and reminded his mom, who comes with him to the appts.

## 2015-08-25 LAB — SEDIMENTATION RATE: Sed Rate: 1 mm/hr (ref 0–15)

## 2015-08-25 LAB — C-REACTIVE PROTEIN: CRP: <0.5 mg/dL (ref <0.60)

## 2015-08-31 ENCOUNTER — Emergency Department (HOSPITAL_COMMUNITY)
Admission: EM | Admit: 2015-08-31 | Discharge: 2015-08-31 | Disposition: A | Discharge status: Home / Self Care | Payer: Medicaid Other | Attending: Emergency Medicine | Admitting: Emergency Medicine

## 2015-08-31 ENCOUNTER — Encounter (HOSPITAL_COMMUNITY): Payer: Self-pay | Admitting: *Deleted

## 2015-08-31 DIAGNOSIS — R109 Unspecified abdominal pain: Secondary | ICD-10-CM | POA: Diagnosis present

## 2015-08-31 DIAGNOSIS — Z79899 Other long term (current) drug therapy: Secondary | ICD-10-CM | POA: Diagnosis not present

## 2015-08-31 DIAGNOSIS — J45909 Unspecified asthma, uncomplicated: Secondary | ICD-10-CM | POA: Diagnosis not present

## 2015-08-31 DIAGNOSIS — F329 Major depressive disorder, single episode, unspecified: Secondary | ICD-10-CM | POA: Insufficient documentation

## 2015-08-31 DIAGNOSIS — K529 Noninfective gastroenteritis and colitis, unspecified: Secondary | ICD-10-CM

## 2015-08-31 DIAGNOSIS — Z87891 Personal history of nicotine dependence: Secondary | ICD-10-CM | POA: Diagnosis not present

## 2015-08-31 LAB — CBC WITH DIFFERENTIAL/PLATELET
Basophils Absolute: 0 10*3/uL (ref 0.0–0.1)
Basophils Relative: 0 %
Eosinophils Absolute: 0.2 10*3/uL (ref 0.0–0.7)
Eosinophils Relative: 4 %
HCT: 37.8 % — ABNORMAL LOW (ref 39.0–52.0)
Hemoglobin: 13.1 g/dL (ref 13.0–17.0)
Lymphocytes Relative: 32 %
Lymphs Abs: 2 10*3/uL (ref 0.7–4.0)
MCH: 29.2 pg (ref 26.0–34.0)
MCHC: 34.7 g/dL (ref 30.0–36.0)
MCV: 84.4 fL (ref 78.0–100.0)
Monocytes Absolute: 0.7 10*3/uL (ref 0.1–1.0)
Monocytes Relative: 11 %
Neutro Abs: 3.3 10*3/uL (ref 1.7–7.7)
Neutrophils Relative %: 53 %
Platelets: 232 10*3/uL (ref 150–400)
RBC: 4.48 MIL/uL (ref 4.22–5.81)
RDW: 12.7 % (ref 11.5–15.5)
WBC: 6.3 10*3/uL (ref 4.0–10.5)

## 2015-08-31 LAB — URINALYSIS, ROUTINE W REFLEX MICROSCOPIC
Glucose, UA: NEGATIVE mg/dL
Hgb urine dipstick: NEGATIVE
Ketones, ur: NEGATIVE mg/dL
Leukocytes, UA: NEGATIVE
Nitrite: NEGATIVE
Protein, ur: NEGATIVE mg/dL
Specific Gravity, Urine: 1.025 (ref 1.005–1.030)
pH: 7 (ref 5.0–8.0)

## 2015-08-31 LAB — COMPREHENSIVE METABOLIC PANEL
ALT: 16 U/L — ABNORMAL LOW (ref 17–63)
AST: 19 U/L (ref 15–41)
Albumin: 3.8 g/dL (ref 3.5–5.0)
Alkaline Phosphatase: 55 U/L (ref 38–126)
Anion gap: 8 (ref 5–15)
BUN: 12 mg/dL (ref 6–20)
CO2: 24 mmol/L (ref 22–32)
Calcium: 8.9 mg/dL (ref 8.9–10.3)
Chloride: 106 mmol/L (ref 101–111)
Creatinine, Ser: 0.94 mg/dL (ref 0.61–1.24)
GFR calc Af Amer: >60 mL/min (ref >60)
GFR calc non Af Amer: >60 mL/min (ref >60)
Glucose, Bld: 111 mg/dL — ABNORMAL HIGH (ref 65–99)
Potassium: 4.2 mmol/L (ref 3.5–5.1)
Sodium: 138 mmol/L (ref 135–145)
Total Bilirubin: 0.2 mg/dL — ABNORMAL LOW (ref 0.3–1.2)
Total Protein: 6.7 g/dL (ref 6.5–8.1)

## 2015-08-31 LAB — POC OCCULT BLOOD, ED: Fecal Occult Bld: POSITIVE — AB

## 2015-08-31 LAB — LIPASE, BLOOD: Lipase: 23 U/L (ref 11–51)

## 2015-08-31 MED ORDER — SODIUM CHLORIDE 0.9 % IV BOLUS (SEPSIS)
1000.0000 mL | Freq: Once | INTRAVENOUS | Status: AC
  Administered 2015-08-31: 1000 mL via INTRAVENOUS

## 2015-08-31 MED ORDER — METHYLPREDNISOLONE SODIUM SUCC 125 MG IJ SOLR
125.0000 mg | Freq: Once | INTRAMUSCULAR | Status: AC
  Administered 2015-08-31: 125 mg via INTRAVENOUS
  Filled 2015-08-31: qty 2

## 2015-08-31 MED ORDER — PREDNISONE 10 MG PO TABS: 60.0000 mg | ORAL_TABLET | Freq: Every day | ORAL | Status: DC

## 2015-08-31 MED ORDER — MORPHINE SULFATE (PF) 4 MG/ML IV SOLN
4.0000 mg | Freq: Once | INTRAVENOUS | Status: AC
  Administered 2015-08-31: 4 mg via INTRAVENOUS
  Filled 2015-08-31: qty 1

## 2015-08-31 MED ORDER — MORPHINE SULFATE (PF) 4 MG/ML IV SOLN
4.0000 mg | Freq: Once | INTRAVENOUS | Status: AC
Start: 2015-08-31 — End: 2015-08-31
  Administered 2015-08-31: 4 mg via INTRAVENOUS
  Filled 2015-08-31: qty 1

## 2015-08-31 MED ORDER — HYDROCODONE-ACETAMINOPHEN 5-325 MG PO TABS: 1.0000 | ORAL_TABLET | ORAL | Status: DC | PRN

## 2015-08-31 NOTE — ED Provider Notes (Signed)
CSN: [id number]     Arrival date & time 08/31/15  3086 History   First MD Initiated Contact with Patient 08/31/15 [phone]     Chief Complaint  Patient presents with  . Abdominal Pain     HPI Patient presents to the emergency department with complaints of a small amount of blood coming from his rectum and left-sided abdominal pain.  He has a long-standing history of colitis and has had a colonoscopy and was diagnosed 2 years ago.  He has intermittent symptoms like this and usually responds to prednisone.  He denies fevers and chills.  Reports her nausea vomiting.  Reports crampy left-sided abdominal pain.  No flank pain.  His mother is present with him.  Pain is moderate in severity.   Past Medical History  Diagnosis Date  . Asthma   . ADHD (attention deficit hyperactivity disorder)   . Bipolar 1 disorder ([place])   . GERD (gastroesophageal reflux disease)   . Depression   . Anxiety   . Mental developmental delay 11/15/2012  . PONV (postoperative nausea and vomiting)   . Hearing loss   . HOH (hard of hearing)   . Abdominal pain, chronic, right upper quadrant   . Colitis   . Seizures [facility])    Past Surgical History  Procedure Laterality Date  . Tonsillectomy    . Adenoidectomy    . Esophagogastroduodenoscopy (egd) with propofol N/A 12/07/2012    SLF:The mucosa of the esophagus appeared normal Non-erosive gastritis (inflammation) was found in the gastric antrum; multiple biopsies The duodenal mucosa showed no abnormalities in the bulb and second portion of the duodenum  . Biopsy N/A 12/07/2012    Procedure: GASTRIC BIOPSIES;  Surgeon: [name], MD;  Location: [facility];  Service: Endoscopy;  Laterality: N/A;  . Colonoscopy with propofol N/A 08/30/2013    ONG:EXBMWU mucosa in the terminal ileum/COLITIS/ MILD PROCTITIS. 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  Marland Kitchen Biopsy N/A 08/30/2013     Procedure: BIOPSY;  Surgeon: [name], MD;  Location: [facility];  Service: Endoscopy;  Laterality: N/A;  right colon,transverse colon, left colon, rectal biopsies   Family History  Problem Relation Age of Onset  . Asthma Mother   . Ulcers Mother   . Bipolar disorder Mother   . Colon cancer Neg Hx   . Liver disease Neg Hx   . ADD / ADHD Father   . Diabetes Maternal Grandmother   . Diabetes Maternal Grandfather    Social History  Substance Use Topics  . Smoking status: Former Smoker -- 0.00 packs/day for 2 years    Types: Cigarettes    Quit date: 10/06/2012  . Smokeless tobacco: Never Used  . Alcohol Use: No     Comment: denies usage    Review of Systems  All other systems reviewed and are negative.     Allergies  Amoxicillin-pot clavulanate; Ibuprofen; Omeprazole; Pineapple; Strawberry extract; and Tomato  Home Medications   Prior to Admission medications   Medication Sig Start Date End Date Taking? Authorizing Provider  albuterol (PROAIR HFA) 108 (90 BASE) MCG/ACT inhaler Inhale 2 puffs into the lungs every 4 (four) hours as needed for wheezing. Persistent dry cough 09/21/13  Yes Alethea Johnathan Hausen, MD  atomoxetine (STRATTERA) 25 MG capsule Take 25 mg by mouth daily. Takes with the 100 mg to make 125 mg daily   Yes Historical Provider, MD  divalproex (  DEPAKOTE) 500 MG DR tablet Take 1 tablet (500 mg total) by mouth 2 (two) times daily. 07/03/15  Yes Penni Bombard, MD  EPINEPHrine 0.3 mg/0.3 mL IJ SOAJ injection Inject 0.3 mg into the muscle once.   Yes Historical Provider, MD  fluticasone (FLONASE) 50 MCG/ACT nasal spray Place 1 spray into both nostrils daily. Patient taking differently: Place 1 spray into both nostrils daily as needed for allergies.  11/13/14  Yes [name], NP  loratadine (CLARITIN) 10 MG tablet Take 10 mg by mouth at bedtime.    Yes Historical Provider, MD  mesalamine (APRISO) 0.375 G 24 hr capsule Take 4 capsules (1.5 g total) by mouth daily.  11/13/14  Yes [name], NP  montelukast (SINGULAIR) 10 MG tablet Take 1 tablet (10 mg total) by mouth at bedtime. 11/13/14  Yes [name], NP  ranitidine (ZANTAC) 150 MG tablet Take 1 tablet (150 mg total) by mouth daily. 11/13/14  Yes [name], NP  sertraline (ZOLOFT) 100 MG tablet Take 1 tablet (100 mg total) by mouth daily. 11/13/14  Yes [name], NP  STRATTERA 100 MG capsule TK 1 C PO QD 08/09/15  Yes Historical Provider, MD  traZODone (DESYREL) 100 MG tablet TK 1 T PO QHS 08/07/15  Yes Historical Provider, MD  HYDROcodone-acetaminophen (NORCO/VICODIN) 5-325 MG tablet Take 1 tablet by mouth every 4 (four) hours as needed for moderate pain. 08/31/15   Jola Schmidt, MD  predniSONE (DELTASONE) 10 MG tablet Take 6 tablets (60 mg total) by mouth daily. 08/31/15   Jola Schmidt, MD   BP 134/70 mmHg  Pulse 93  Temp(Src) 98.2 F (36.8 C) (Oral)  Resp 18  Ht 5' 7"  (1.702 m)  Wt 230 lb (104.327 kg)  BMI 36.01 kg/m2  SpO2 100% Physical Exam  Constitutional: He is oriented to person, place, and time. He appears well-developed and well-nourished.  HENT:  Head: Normocephalic.  Eyes: EOM are normal.  Neck: Normal range of motion.  Cardiovascular: Normal rate.   Pulmonary/Chest: Effort normal.  Abdominal: He exhibits no distension. There is no tenderness.  Genitourinary:  No gross blood on rectal examination  Musculoskeletal: Normal range of motion.  Neurological: He is alert and oriented to person, place, and time.  Psychiatric: He has a normal mood and affect.  Nursing note and vitals reviewed.   ED Course  Procedures (including critical care time) Labs Review Labs Reviewed  CBC WITH DIFFERENTIAL/PLATELET - Abnormal; Notable for the following:    HCT 37.8 (*)    All other components within normal limits  COMPREHENSIVE METABOLIC PANEL - Abnormal; Notable for the following:    Glucose, Bld 111 (*)    ALT 16 (*)    Total Bilirubin 0.2 (*)    All other components within normal  limits  URINALYSIS, ROUTINE W REFLEX MICROSCOPIC (NOT AT [facility]) - Abnormal; Notable for the following:    Bilirubin Urine SMALL (*)    All other components within normal limits  POC OCCULT BLOOD, ED - Abnormal; Notable for the following:    Fecal Occult Bld POSITIVE (*)    All other components within normal limits  LIPASE, BLOOD    Imaging Review No results found. I have personally reviewed and evaluated these images and lab results as part of my medical decision-making.   EKG Interpretation None      MDM   Final diagnoses:  Colitis    Overall well appearing. Likely colitis. Dc home with short course  of steroids and pcp follow up hgb stable. Dc home in good condition    Jola Schmidt, MD 08/31/15 [phone]

## 2015-08-31 NOTE — ED Notes (Signed)
Pt has a hx of colitis. Person with pt reports pt has been having dark, bloody stools since last week. Pt began having abdominal pain this morning. Pt denies n/v/d or urinary symptoms.

## 2015-08-31 NOTE — Discharge Instructions (Signed)
Colitis Colitis is inflammation of the colon. Colitis may last a short time (acute) or it may last a long time (chronic). CAUSES This condition may be caused by:  Viruses.  Bacteria.  Reactions to medicine.  Certain autoimmune diseases, such as Crohn disease or ulcerative colitis. SYMPTOMS Symptoms of this condition include:  Diarrhea.  Passing bloody or tarry stool.  Pain.  Fever.  Vomiting.  Tiredness (fatigue).  Weight loss.  Bloating.  Sudden increase in abdominal pain.  Having fewer bowel movements than usual. DIAGNOSIS This condition is diagnosed with a stool test or a blood test. You may also have other tests, including X-rays, a CT scan, or a colonoscopy. TREATMENT Treatment may include:  Resting the bowel. This involves not eating or drinking for a period of time.  Fluids that are given through an IV tube.  Medicine for pain and diarrhea.  Antibiotic medicines.  Cortisone medicines.  Surgery. HOME CARE INSTRUCTIONS Eating and Drinking  Follow instructions from your health care provider about eating or drinking restrictions.  Drink enough fluid to keep your urine clear or pale yellow.  Work with a dietitian to determine which foods cause your condition to flare up.  Avoid foods that cause flare-ups.  Eat a well-balanced diet. Medicines  Take over-the-counter and prescription medicines only as told by your health care provider.  If you were prescribed an antibiotic medicine, take it as told by your health care provider. Do not stop taking the antibiotic even if you start to feel better. General Instructions  Keep all follow-up visits as told by your health care provider. This is important. SEEK MEDICAL CARE IF:  Your symptoms do not go away.  You develop new symptoms. SEEK IMMEDIATE MEDICAL CARE IF:  You have a fever that does not go away with treatment.  You develop chills.  You have extreme weakness, fainting, or  dehydration.  You have repeated vomiting.  You develop severe pain in your abdomen.  You pass bloody or tarry stool.   This information is not intended to replace advice given to you by your health care provider. Make sure you discuss any questions you have with your health care provider.   Document Released: 07/03/2004 Document Revised: 02/14/2015 Document Reviewed: 09/18/2014 Elsevier Interactive Patient Education [employer].

## 2015-09-04 NOTE — Progress Notes (Signed)
Quick Note:  LMOM for a return call. ______

## 2015-09-04 NOTE — Progress Notes (Signed)
Quick Note:  Negative CRP and sed rate. These are remaining normal. Still has not done stool studies. I see he went to the ED? How is he doing now after receiving steroids? ______

## 2015-09-04 NOTE — Progress Notes (Signed)
Quick Note:  PT's mother, Carlena Sax is aware of the results. She said pt tried to turn in stools and did not have enough sample. He has some more bottles and will try again. She said he is doing a little better since the steroids. ______

## 2015-09-18 ENCOUNTER — Emergency Department (HOSPITAL_COMMUNITY)
Admission: EM | Admit: 2015-09-18 | Discharge: 2015-09-18 | Disposition: A | Discharge status: Home / Self Care | Payer: Medicaid Other | Attending: Emergency Medicine | Admitting: Emergency Medicine

## 2015-09-18 ENCOUNTER — Ambulatory Visit: Payer: Self-pay | Admitting: Nurse Practitioner

## 2015-09-18 ENCOUNTER — Encounter (HOSPITAL_COMMUNITY): Payer: Self-pay | Admitting: Emergency Medicine

## 2015-09-18 DIAGNOSIS — J45909 Unspecified asthma, uncomplicated: Secondary | ICD-10-CM | POA: Insufficient documentation

## 2015-09-18 DIAGNOSIS — F319 Bipolar disorder, unspecified: Secondary | ICD-10-CM | POA: Diagnosis not present

## 2015-09-18 DIAGNOSIS — F909 Attention-deficit hyperactivity disorder, unspecified type: Secondary | ICD-10-CM | POA: Insufficient documentation

## 2015-09-18 DIAGNOSIS — K529 Noninfective gastroenteritis and colitis, unspecified: Secondary | ICD-10-CM | POA: Insufficient documentation

## 2015-09-18 DIAGNOSIS — Z87891 Personal history of nicotine dependence: Secondary | ICD-10-CM | POA: Diagnosis not present

## 2015-09-18 DIAGNOSIS — K625 Hemorrhage of anus and rectum: Secondary | ICD-10-CM | POA: Diagnosis not present

## 2015-09-18 DIAGNOSIS — K922 Gastrointestinal hemorrhage, unspecified: Secondary | ICD-10-CM | POA: Diagnosis present

## 2015-09-18 LAB — CBC WITH DIFFERENTIAL/PLATELET
Basophils Absolute: 0 10*3/uL (ref 0.0–0.1)
Basophils Relative: 0 %
Eosinophils Absolute: 0.4 10*3/uL (ref 0.0–0.7)
Eosinophils Relative: 7 %
HCT: 32.3 % — ABNORMAL LOW (ref 39.0–52.0)
Hemoglobin: 11.3 g/dL — ABNORMAL LOW (ref 13.0–17.0)
Lymphocytes Relative: 34 %
Lymphs Abs: 1.9 10*3/uL (ref 0.7–4.0)
MCH: 29.8 pg (ref 26.0–34.0)
MCHC: 35 g/dL (ref 30.0–36.0)
MCV: 85.2 fL (ref 78.0–100.0)
Monocytes Absolute: 0.5 10*3/uL (ref 0.1–1.0)
Monocytes Relative: 9 %
Neutro Abs: 2.7 10*3/uL (ref 1.7–7.7)
Neutrophils Relative %: 50 %
Platelets: 211 10*3/uL (ref 150–400)
RBC: 3.79 MIL/uL — ABNORMAL LOW (ref 4.22–5.81)
RDW: 13.2 % (ref 11.5–15.5)
WBC: 5.4 10*3/uL (ref 4.0–10.5)

## 2015-09-18 LAB — COMPREHENSIVE METABOLIC PANEL
ALT: 17 U/L (ref 17–63)
AST: 22 U/L (ref 15–41)
Albumin: 3.5 g/dL (ref 3.5–5.0)
Alkaline Phosphatase: 59 U/L (ref 38–126)
Anion gap: 7 (ref 5–15)
BUN: 6 mg/dL (ref 6–20)
CO2: 25 mmol/L (ref 22–32)
Calcium: 8.6 mg/dL — ABNORMAL LOW (ref 8.9–10.3)
Chloride: 108 mmol/L (ref 101–111)
Creatinine, Ser: 0.99 mg/dL (ref 0.61–1.24)
GFR calc Af Amer: >60 mL/min (ref >60)
GFR calc non Af Amer: >60 mL/min (ref >60)
Glucose, Bld: 110 mg/dL — ABNORMAL HIGH (ref 65–99)
Potassium: 3.8 mmol/L (ref 3.5–5.1)
Sodium: 140 mmol/L (ref 135–145)
Total Bilirubin: 0.1 mg/dL — ABNORMAL LOW (ref 0.3–1.2)
Total Protein: 6.3 g/dL — ABNORMAL LOW (ref 6.5–8.1)

## 2015-09-18 LAB — POC OCCULT BLOOD, ED: Fecal Occult Bld: POSITIVE — AB

## 2015-09-18 MED ORDER — ONDANSETRON HCL 4 MG/2ML IJ SOLN
4.0000 mg | Freq: Once | INTRAMUSCULAR | Status: AC
  Administered 2015-09-18: 4 mg via INTRAVENOUS
  Filled 2015-09-18: qty 2

## 2015-09-18 MED ORDER — HYDROCODONE-ACETAMINOPHEN 5-325 MG PO TABS: 1.0000 | ORAL_TABLET | Freq: Four times a day (QID) | ORAL | Status: DC | PRN

## 2015-09-18 MED ORDER — PREDNISONE 20 MG PO TABS: ORAL_TABLET | ORAL | Status: DC

## 2015-09-18 MED ORDER — SODIUM CHLORIDE 0.9 % IV BOLUS (SEPSIS)
1000.0000 mL | Freq: Once | INTRAVENOUS | Status: AC
  Administered 2015-09-18: 1000 mL via INTRAVENOUS

## 2015-09-18 MED ORDER — FENTANYL CITRATE (PF) 100 MCG/2ML IJ SOLN
50.0000 ug | Freq: Once | INTRAMUSCULAR | Status: AC
  Administered 2015-09-18: 50 ug via INTRAVENOUS
  Filled 2015-09-18: qty 2

## 2015-09-18 MED ORDER — METHYLPREDNISOLONE SODIUM SUCC 125 MG IJ SOLR
125.0000 mg | Freq: Once | INTRAMUSCULAR | Status: AC
  Administered 2015-09-18: 125 mg via INTRAVENOUS
  Filled 2015-09-18: qty 2

## 2015-09-18 NOTE — Discharge Instructions (Signed)
Restart the prednisone. Please call Dr. [name] office today and have a follow-up appointment in the next 24 hours. Return to the emergency department if you get a fever, you have worsening abdominal pain, vomiting, or using worse.   Colitis Colitis is inflammation of the colon. Colitis may last a short time (acute) or it may last a long time (chronic). CAUSES This condition may be caused by:  Viruses.  Bacteria.  Reactions to medicine.  Certain autoimmune diseases, such as Crohn disease or ulcerative colitis. SYMPTOMS Symptoms of this condition include:  Diarrhea.  Passing bloody or tarry stool.  Pain.  Fever.  Vomiting.  Tiredness (fatigue).  Weight loss.  Bloating.  Sudden increase in abdominal pain.  Having fewer bowel movements than usual. DIAGNOSIS This condition is diagnosed with a stool test or a blood test. You may also have other tests, including X-rays, a CT scan, or a colonoscopy. TREATMENT Treatment may include:  Resting the bowel. This involves not eating or drinking for a period of time.  Fluids that are given through an IV tube.  Medicine for pain and diarrhea.  Antibiotic medicines.  Cortisone medicines.  Surgery. HOME CARE INSTRUCTIONS Eating and Drinking  Follow instructions from your health care provider about eating or drinking restrictions.  Drink enough fluid to keep your urine clear or pale yellow.  Work with a dietitian to determine which foods cause your condition to flare up.  Avoid foods that cause flare-ups.  Eat a well-balanced diet. Medicines  Take over-the-counter and prescription medicines only as told by your health care provider.  If you were prescribed an antibiotic medicine, take it as told by your health care provider. Do not stop taking the antibiotic even if you start to feel better. General Instructions  Keep all follow-up visits as told by your health care provider. This is important. SEEK MEDICAL CARE  IF:  Your symptoms do not go away.  You develop new symptoms. SEEK IMMEDIATE MEDICAL CARE IF:  You have a fever that does not go away with treatment.  You develop chills.  You have extreme weakness, fainting, or dehydration.  You have repeated vomiting.  You develop severe pain in your abdomen.  You pass bloody or tarry stool.   This information is not intended to replace advice given to you by your health care provider. Make sure you discuss any questions you have with your health care provider.   Document Released: 07/03/2004 Document Revised: 02/14/2015 Document Reviewed: 09/18/2014 Elsevier Interactive Patient Education [employer].

## 2015-09-18 NOTE — ED Provider Notes (Signed)
CSN: [id number]     Arrival date & time 09/18/15  0036 History  By signing my name below, I, [facility], attest that this documentation has been prepared under the direction and in the presence of Rolland Porter, MD at [facility]. Electronically Signed: Virgel Bouquet, ED Scribe. 09/18/2015. 6:17 AM.   Chief Complaint  Patient presents with  . GI Bleeding   The history is provided by the patient. No language interpreter was used.  HPI Comments: Wayne Avila is a 25 y.o. male with a PMHx of GERD, ulcerative colitis, and chronic abdominal pain who presents to the Emergency Department complaining of intermittent, dark red diarrhea with blood clots onset earlier today. Mother states that he had dark red diarrhea for the past week but that blood clots appeared in his stool today.She reports that patient has had 7-8 episodes of diarrhea today. Patient notes constant, sharp, bilateral lateral abdominal pain today, dizziness, and lightheadedness. Per mother, he has been taking mesalamine regularly for treatment of his ulcerative colitis. She notes similar symptoms in the past, most recently last month which improved with steroids. He last saw his GI specialist in the past month following the ED visit on March 24. Denies cigarette smoking and ETOH consumption. Denies fever, nausea, vomiting. He was diagnosed with ulcerative colitis 2 years ago. Mother states he's never had to have a blood transfusion.  GI: Dr. [name] PCP Dr Jeanie Cooks  Past Medical History  Diagnosis Date  . Asthma   . ADHD (attention deficit hyperactivity disorder)   . Bipolar 1 disorder ([place])   . GERD (gastroesophageal reflux disease)   . Depression   . Anxiety   . Mental developmental delay 11/15/2012  . PONV (postoperative nausea and vomiting)   . Hearing loss   . HOH (hard of hearing)   . Abdominal pain, chronic, right upper quadrant   . Colitis   . Seizures [facility])    Past Surgical History  Procedure Laterality Date  .  Tonsillectomy    . Adenoidectomy    . Esophagogastroduodenoscopy (egd) with propofol N/A 12/07/2012    SLF:The mucosa of the esophagus appeared normal Non-erosive gastritis (inflammation) was found in the gastric antrum; multiple biopsies The duodenal mucosa showed no abnormalities in the bulb and second portion of the duodenum  . Biopsy N/A 12/07/2012    Procedure: GASTRIC BIOPSIES;  Surgeon: [name], MD;  Location: [facility];  Service: Endoscopy;  Laterality: N/A;  . Colonoscopy with propofol N/A 08/30/2013    TDD:UKGURK mucosa in the terminal ileum/COLITIS/ MILD PROCTITIS. 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  Marland Kitchen Biopsy N/A 08/30/2013    Procedure: BIOPSY;  Surgeon: [name], MD;  Location: [facility];  Service: Endoscopy;  Laterality: N/A;  right colon,transverse colon, left colon, rectal biopsies   Family History  Problem Relation Age of Onset  . Asthma Mother   . Ulcers Mother   . Bipolar disorder Mother   . Colon cancer Neg Hx   . Liver disease Neg Hx   . ADD / ADHD Father   . Diabetes Maternal Grandmother   . Diabetes Maternal Grandfather    Social History  Substance Use Topics  . Smoking status: Former Smoker -- 0.00 packs/day for 2 years    Types: Cigarettes    Quit date: 10/06/2012  . Smokeless tobacco: Never Used  . Alcohol Use: No     Comment: denies usage  lives with mother  On disabilty for ADHD, bipolar, intermittent explosive disorder, asthma  Review of Systems  Constitutional: Negative for fever.  Gastrointestinal: Positive for abdominal pain, diarrhea and blood in stool. Negative for nausea and vomiting.  Neurological: Positive for dizziness and light-headedness.  All other systems reviewed and are negative.  Allergies  Amoxicillin-pot clavulanate; Ibuprofen; Omeprazole; Pineapple; Strawberry extract; and Tomato  Home Medications   Prior to  Admission medications   Medication Sig Start Date End Date Taking? Authorizing Provider  albuterol (PROAIR HFA) 108 (90 BASE) MCG/ACT inhaler Inhale 2 puffs into the lungs every 4 (four) hours as needed for wheezing. Persistent dry cough 09/21/13   Sandi Mealy, MD  atomoxetine (STRATTERA) 25 MG capsule Take 25 mg by mouth daily. Takes with the 100 mg to make 125 mg daily    Historical Provider, MD  divalproex (DEPAKOTE) 500 MG DR tablet Take 1 tablet (500 mg total) by mouth 2 (two) times daily. 07/03/15   Penni Bombard, MD  EPINEPHrine 0.3 mg/0.3 mL IJ SOAJ injection Inject 0.3 mg into the muscle once.    Historical Provider, MD  fluticasone (FLONASE) 50 MCG/ACT nasal spray Place 1 spray into both nostrils daily. Patient taking differently: Place 1 spray into both nostrils daily as needed for allergies.  11/13/14   [name], NP  HYDROcodone-acetaminophen (NORCO/VICODIN) 5-325 MG tablet Take 1 tablet by mouth every 6 (six) hours as needed for moderate pain. 09/18/15   Rolland Porter, MD  loratadine (CLARITIN) 10 MG tablet Take 10 mg by mouth at bedtime.     Historical Provider, MD  mesalamine (APRISO) 0.375 G 24 hr capsule Take 4 capsules (1.5 g total) by mouth daily. 11/13/14   [name], NP  montelukast (SINGULAIR) 10 MG tablet Take 1 tablet (10 mg total) by mouth at bedtime. 11/13/14   [name], NP  predniSONE (DELTASONE) 20 MG tablet Take 3 po QD x 3d , then 2 po QD x 3d then 1 po QD x 3d 09/18/15   Rolland Porter, MD  ranitidine (ZANTAC) 150 MG tablet Take 1 tablet (150 mg total) by mouth daily. 11/13/14   [name], NP  sertraline (ZOLOFT) 100 MG tablet Take 1 tablet (100 mg total) by mouth daily. 11/13/14   [name], NP  STRATTERA 100 MG capsule TK 1 C PO QD 08/09/15   Historical Provider, MD  traZODone (DESYREL) 100 MG tablet TK 1 T PO QHS 08/07/15   Historical Provider, MD   BP 144/100 mmHg  Pulse 108  Temp(Src) 98.9 F (37.2 C) (Oral)  Resp 20  Ht 5' 7"  (1.702 m)  Wt 230 lb  (104.327 kg)  BMI 36.01 kg/m2  SpO2 98%  Vital signs normal except for tachycardia  Physical Exam  Constitutional: He is oriented to person, place, and time. He appears well-developed and well-nourished.  Non-toxic appearance. He does not appear ill. No distress.  HENT:  Head: Normocephalic and atraumatic.  Right Ear: External ear normal.  Left Ear: External ear normal.  Nose: Nose normal. No mucosal edema or rhinorrhea.  Mouth/Throat: Oropharynx is clear and moist. Mucous membranes are dry. No dental abscesses or uvula swelling.  Dry mouth.  Eyes: Conjunctivae and EOM are normal. Pupils are equal, round, and reactive to light.  Neck: Normal range of motion and full passive range of motion without pain. Neck supple.  Cardiovascular: Normal rate, regular rhythm and normal heart sounds.  Exam reveals no gallop and no  friction rub.   No murmur heard. Pulmonary/Chest: Effort normal and breath sounds normal. No respiratory distress. He has no wheezes. He has no rhonchi. He has no rales. He exhibits no tenderness and no crepitus.  Abdominal: Soft. Normal appearance and bowel sounds are normal. He exhibits no distension. There is tenderness. There is no rebound and no guarding.  Diffuse abdominal tenderness.  Musculoskeletal: Normal range of motion. He exhibits no edema or tenderness.  Moves all extremities well.   Neurological: He is alert and oriented to person, place, and time. He has normal strength. No cranial nerve deficit.  Skin: Skin is warm, dry and intact. No rash noted. No erythema. No pallor.  Psychiatric: He has a normal mood and affect. His speech is normal and behavior is normal. His mood appears not anxious.  Nursing note and vitals reviewed.   ED Course  Procedures  Medications  sodium chloride 0.9 % bolus 1,000 mL (0 mLs Intravenous Stopped 09/18/15 0300)  sodium chloride 0.9 % bolus 1,000 mL (0 mLs Intravenous Stopped 09/18/15 0300)  methylPREDNISolone sodium succinate  (SOLU-MEDROL) 125 mg/2 mL injection 125 mg (125 mg Intravenous Given 09/18/15 0146)  fentaNYL (SUBLIMAZE) injection 50 mcg (50 mcg Intravenous Given 09/18/15 0146)  ondansetron (ZOFRAN) injection 4 mg (4 mg Intravenous Given 09/18/15 0146)     DIAGNOSTIC STUDIES: Oxygen Saturation is 98% on RA, normal by my interpretation.    COORDINATION OF CARE: 12:50 AM Will order labs, IV fluids, Solu-Medrol, fentanyl, and Zofran. Discussed treatment plan with pt at bedside and pt agreed to plan.  Recheck at 06:00AM (involved with a critical care patient for a couple of hours). Pt is playing games on his cell phone, smiling, states he feels better, abdomen is soft and nontender to palpation.  I discussed with the mother his white count is normal, he does have a mild drop in his hemoglobin however he is not at the point where he would need a blood transfusion. He is feeling better with minimal intervention in the ED. At this point like he can be discharged home and follow up with his gastroenterologist this week.   Labs Review Results for orders placed or performed during the hospital encounter of 09/18/15  Comprehensive metabolic panel  Result Value Ref Range   Sodium 140 135 - 145 mmol/L   Potassium 3.8 3.5 - 5.1 mmol/L   Chloride 108 101 - 111 mmol/L   CO2 25 22 - 32 mmol/L   Glucose, Bld 110 (H) 65 - 99 mg/dL   BUN 6 6 - 20 mg/dL   Creatinine, Ser 0.99 0.61 - 1.24 mg/dL   Calcium 8.6 (L) 8.9 - 10.3 mg/dL   Total Protein 6.3 (L) 6.5 - 8.1 g/dL   Albumin 3.5 3.5 - 5.0 g/dL   AST 22 15 - 41 U/L   ALT 17 17 - 63 U/L   Alkaline Phosphatase 59 38 - 126 U/L   Total Bilirubin 0.1 (L) 0.3 - 1.2 mg/dL   GFR calc non Af Amer >60 >60 mL/min   GFR calc Af Amer >60 >60 mL/min   Anion gap 7 5 - 15  CBC with Differential  Result Value Ref Range   WBC 5.4 4.0 - 10.5 K/uL   RBC 3.79 (L) 4.22 - 5.81 MIL/uL   Hemoglobin 11.3 (L) 13.0 - 17.0 g/dL   HCT 32.3 (L) 39.0 - 52.0 %   MCV 85.2 78.0 - 100.0 fL    MCH 29.8 26.0 - 34.0 pg  MCHC 35.0 30.0 - 36.0 g/dL   RDW 13.2 11.5 - 15.5 %   Platelets 211 150 - 400 K/uL   Neutrophils Relative % 50 %   Neutro Abs 2.7 1.7 - 7.7 K/uL   Lymphocytes Relative 34 %   Lymphs Abs 1.9 0.7 - 4.0 K/uL   Monocytes Relative 9 %   Monocytes Absolute 0.5 0.1 - 1.0 K/uL   Eosinophils Relative 7 %   Eosinophils Absolute 0.4 0.0 - 0.7 K/uL   Basophils Relative 0 %   Basophils Absolute 0.0 0.0 - 0.1 K/uL  POC occult blood, ED RN will collect  Result Value Ref Range   Fecal Occult Bld POSITIVE (A) NEGATIVE   Laboratory interpretation all normal except Mild anemia, positive Hemoccult   I have personally reviewed and evaluated these images and lab results as part of my medical decision-making.    MDM   Final diagnoses:  Colitis  Rectal bleeding   New Prescriptions   HYDROCODONE-ACETAMINOPHEN (NORCO/VICODIN) 5-325 MG TABLET    Take 1 tablet by mouth every 6 (six) hours as needed for moderate pain.   PREDNISONE (DELTASONE) 20 MG TABLET    Take 3 po QD x 3d , then 2 po QD x 3d then 1 po QD x 3d    Plan discharge  Rolland Porter, MD, FACEP   I personally performed the services described in this documentation, which was scribed in my presence. The recorded information has been reviewed and considered.  Rolland Porter, MD, [place] Or, MD 09/18/15 [phone]

## 2015-09-18 NOTE — ED Notes (Signed)
Apology for delay- explained to pt and family that there is an emergency in the department- NS x200cc in so discontinued. Pt request something to drink and ice chips provided

## 2015-09-18 NOTE — ED Notes (Signed)
Pt c/o bloody stools and abd pain all day.

## 2015-10-11 ENCOUNTER — Encounter (HOSPITAL_COMMUNITY): Payer: Self-pay | Admitting: Emergency Medicine

## 2015-10-11 ENCOUNTER — Emergency Department (HOSPITAL_COMMUNITY)
Admission: EM | Admit: 2015-10-11 | Discharge: 2015-10-11 | Disposition: A | Discharge status: Home / Self Care | Payer: Medicaid Other | Attending: Emergency Medicine | Admitting: Emergency Medicine

## 2015-10-11 DIAGNOSIS — Z79899 Other long term (current) drug therapy: Secondary | ICD-10-CM | POA: Insufficient documentation

## 2015-10-11 DIAGNOSIS — F329 Major depressive disorder, single episode, unspecified: Secondary | ICD-10-CM | POA: Insufficient documentation

## 2015-10-11 DIAGNOSIS — Z87891 Personal history of nicotine dependence: Secondary | ICD-10-CM | POA: Insufficient documentation

## 2015-10-11 DIAGNOSIS — J45909 Unspecified asthma, uncomplicated: Secondary | ICD-10-CM | POA: Diagnosis not present

## 2015-10-11 DIAGNOSIS — K529 Noninfective gastroenteritis and colitis, unspecified: Secondary | ICD-10-CM | POA: Insufficient documentation

## 2015-10-11 DIAGNOSIS — K625 Hemorrhage of anus and rectum: Secondary | ICD-10-CM | POA: Diagnosis present

## 2015-10-11 LAB — BASIC METABOLIC PANEL
Anion gap: 6 (ref 5–15)
BUN: 7 mg/dL (ref 6–20)
CO2: 26 mmol/L (ref 22–32)
Calcium: 9.1 mg/dL (ref 8.9–10.3)
Chloride: 106 mmol/L (ref 101–111)
Creatinine, Ser: 1 mg/dL (ref 0.61–1.24)
GFR calc Af Amer: >60 mL/min (ref >60)
GFR calc non Af Amer: >60 mL/min (ref >60)
Glucose, Bld: 103 mg/dL — ABNORMAL HIGH (ref 65–99)
Potassium: 4.3 mmol/L (ref 3.5–5.1)
Sodium: 138 mmol/L (ref 135–145)

## 2015-10-11 LAB — TYPE AND SCREEN
ABO/RH(D): O POS
Antibody Screen: NEGATIVE

## 2015-10-11 LAB — CBC WITH DIFFERENTIAL/PLATELET
Basophils Absolute: 0 10*3/uL (ref 0.0–0.1)
Basophils Relative: 0 %
Eosinophils Absolute: 0.3 10*3/uL (ref 0.0–0.7)
Eosinophils Relative: 6 %
HCT: 36.4 % — ABNORMAL LOW (ref 39.0–52.0)
Hemoglobin: 12.4 g/dL — ABNORMAL LOW (ref 13.0–17.0)
Lymphocytes Relative: 24 %
Lymphs Abs: 1.2 10*3/uL (ref 0.7–4.0)
MCH: 29.5 pg (ref 26.0–34.0)
MCHC: 34.1 g/dL (ref 30.0–36.0)
MCV: 86.5 fL (ref 78.0–100.0)
Monocytes Absolute: 0.5 10*3/uL (ref 0.1–1.0)
Monocytes Relative: 10 %
Neutro Abs: 3 10*3/uL (ref 1.7–7.7)
Neutrophils Relative %: 60 %
Platelets: 262 10*3/uL (ref 150–400)
RBC: 4.21 MIL/uL — ABNORMAL LOW (ref 4.22–5.81)
RDW: 13.5 % (ref 11.5–15.5)
WBC: 5.1 10*3/uL (ref 4.0–10.5)

## 2015-10-11 LAB — POC OCCULT BLOOD, ED: Fecal Occult Bld: NEGATIVE

## 2015-10-11 MED ORDER — SODIUM CHLORIDE 0.9 % IV BOLUS (SEPSIS)
500.0000 mL | Freq: Once | INTRAVENOUS | Status: AC
  Administered 2015-10-11: 500 mL via INTRAVENOUS

## 2015-10-11 MED ORDER — PREDNISONE 10 MG PO TABS: 10.0000 mg | ORAL_TABLET | Freq: Every day | ORAL | Status: DC

## 2015-10-11 MED ORDER — METHYLPREDNISOLONE SODIUM SUCC 125 MG IJ SOLR
125.0000 mg | Freq: Once | INTRAMUSCULAR | Status: AC
  Administered 2015-10-11: 125 mg via INTRAVENOUS
  Filled 2015-10-11: qty 2

## 2015-10-11 NOTE — ED Notes (Signed)
Seen for rectal bleeding here in ED for rectal bleeding and pt continues to have bleeding.  Pt is scheduled for appointment Dr Sydell Axon office on Monday.  Having dark red and bright stool.  Had about 12 stools since this am.  History of colitis.

## 2015-10-11 NOTE — Discharge Instructions (Signed)
Blood count was stable. Prednisone for several days. Follow-up your GI doctor next week

## 2015-10-11 NOTE — ED Provider Notes (Signed)
CSN: [id number]     Arrival date & time 10/11/15  1615 History   First MD Initiated Contact with Patient 10/11/15 1640     Chief Complaint  Patient presents with  . Rectal Bleeding     (Consider location/radiation/quality/duration/timing/severity/associated sxs/prior Treatment) Patient is a 25 y.o. male presenting with hematochezia.  Rectal Bleeding .Marland KitchenMarland KitchenMarland KitchenSeveral episodes of rectal bleeding the past 2 months, worse since 6:15 AM today. Patient has a history of colitis for which he sees Dr. Fields[gastroenterologist] locally. He is ambulatory without syncope. Mother describes symptoms as blood in the toilet water.  Severity is moderate. Nothing makes symptoms better or worse. He has been on prednisone in the past. Abdomen is nontender  Past Medical History  Diagnosis Date  . Asthma   . ADHD (attention deficit hyperactivity disorder)   . Bipolar 1 disorder ([place])   . GERD (gastroesophageal reflux disease)   . Depression   . Anxiety   . Mental developmental delay 11/15/2012  . PONV (postoperative nausea and vomiting)   . Hearing loss   . HOH (hard of hearing)   . Abdominal pain, chronic, right upper quadrant   . Colitis   . Seizures [facility])    Past Surgical History  Procedure Laterality Date  . Tonsillectomy    . Adenoidectomy    . Esophagogastroduodenoscopy (egd) with propofol N/A 12/07/2012    SLF:The mucosa of the esophagus appeared normal Non-erosive gastritis (inflammation) was found in the gastric antrum; multiple biopsies The duodenal mucosa showed no abnormalities in the bulb and second portion of the duodenum  . Biopsy N/A 12/07/2012    Procedure: GASTRIC BIOPSIES;  Surgeon: [name], MD;  Location: [facility];  Service: Endoscopy;  Laterality: N/A;  . Colonoscopy with propofol N/A 08/30/2013    GEZ:MOQHUT mucosa in the terminal ileum/COLITIS/ MILD PROCTITIS. Biopsies showed patchy chronic active colitis of the right colon and rectum, overall findings most consistent with idiopathic  inflammatory bowel disease.  Marland Kitchen Biopsy N/A 08/30/2013    Procedure: BIOPSY;  Surgeon: [name], MD;  Location: [facility];  Service: Endoscopy;  Laterality: N/A;  right colon,transverse colon, left colon, rectal biopsies   Family History  Problem Relation Age of Onset  . Asthma Mother   . Ulcers Mother   . Bipolar disorder Mother   . Colon cancer Neg Hx   . Liver disease Neg Hx   . ADD / ADHD Father   . Diabetes Maternal Grandmother   . Diabetes Maternal Grandfather    Social History  Substance Use Topics  . Smoking status: Former Smoker -- 0.00 packs/day for 2 years    Types: Cigarettes    Quit date: 10/06/2012  . Smokeless tobacco: Never Used  . Alcohol Use: No     Comment: denies usage    Review of Systems  Gastrointestinal: Positive for hematochezia.  All other systems reviewed and are negative.     Allergies  Amoxicillin-pot clavulanate; Ibuprofen; Omeprazole; Pineapple; Strawberry extract; and Tomato  Home Medications   Prior to Admission medications   Medication Sig Start Date End Date Taking? Authorizing Provider  atomoxetine (STRATTERA) 25 MG capsule Take 25 mg by mouth daily. Takes with the 100 mg to make 125 mg daily   Yes Historical Provider, MD  divalproex (DEPAKOTE) 500 MG DR tablet Take 1 tablet (500 mg total) by mouth 2 (two) times daily. 07/03/15  Yes Penni Bombard, MD  EPINEPHrine 0.3 mg/0.3 mL IJ SOAJ injection Inject 0.3 mg into the muscle once.  Yes Historical Provider, MD  fluticasone (FLONASE) 50 MCG/ACT nasal spray Place 1 spray into both nostrils daily. Patient taking differently: Place 1 spray into both nostrils daily as needed for allergies.  11/13/14  Yes [name], NP  HYDROcodone-acetaminophen (NORCO/VICODIN) 5-325 MG tablet Take 1 tablet by mouth every 6 (six) hours as needed for moderate pain. 09/18/15  Yes Rolland Porter, MD  loratadine (CLARITIN) 10 MG tablet Take 10 mg by mouth at bedtime.    Yes Historical Provider, MD  mesalamine  (APRISO) 0.375 G 24 hr capsule Take 4 capsules (1.5 g total) by mouth daily. 11/13/14  Yes [name], NP  montelukast (SINGULAIR) 10 MG tablet Take 1 tablet (10 mg total) by mouth at bedtime. 11/13/14  Yes [name], NP  ranitidine (ZANTAC) 150 MG tablet Take 1 tablet (150 mg total) by mouth daily. 11/13/14  Yes [name], NP  sertraline (ZOLOFT) 100 MG tablet Take 1 tablet (100 mg total) by mouth daily. 11/13/14  Yes [name], NP  STRATTERA 100 MG capsule take one capsule by mouth once daily 08/09/15  Yes Historical Provider, MD  traZODone (DESYREL) 100 MG tablet Take one tablet by mouth once daily at bedtime 08/07/15  Yes Historical Provider, MD  albuterol (PROAIR HFA) 108 (90 BASE) MCG/ACT inhaler Inhale 2 puffs into the lungs every 4 (four) hours as needed for wheezing. Persistent dry cough 09/21/13   Sandi Mealy, MD  predniSONE (DELTASONE) 10 MG tablet Take 1 tablet (10 mg total) by mouth daily with breakfast. 10/11/15   Nat Christen, MD   BP 126/78 mmHg  Pulse 94  Temp(Src) 98.6 F (37 C) (Oral)  Resp 20  Ht 5' 7"  (1.702 m)  Wt 230 lb (104.327 kg)  BMI 36.01 kg/m2  SpO2 99% Physical Exam  Constitutional: He is oriented to person, place, and time. He appears well-developed and well-nourished.  HENT:  Head: Normocephalic and atraumatic.  Eyes: Conjunctivae and EOM are normal. Pupils are equal, round, and reactive to light.  Neck: Normal range of motion. Neck supple.  Cardiovascular: Normal rate and regular rhythm.   Pulmonary/Chest: Effort normal and breath sounds normal.  Abdominal: Soft. Bowel sounds are normal.  Nontender  Genitourinary:  Rectal exam: No masses, minimal brown stool. Heme negative.  Musculoskeletal: Normal range of motion.  Neurological: He is alert and oriented to person, place, and time.  Skin: Skin is warm and dry.  Psychiatric: He has a normal mood and affect. His behavior is normal.  Nursing note and vitals reviewed.   ED Course  Procedures  (including critical care time) Labs Review Labs Reviewed  CBC WITH DIFFERENTIAL/PLATELET - Abnormal; Notable for the following:    RBC 4.21 (*)    Hemoglobin 12.4 (*)    HCT 36.4 (*)    All other components within normal limits  BASIC METABOLIC PANEL - Abnormal; Notable for the following:    Glucose, Bld 103 (*)    All other components within normal limits  POC OCCULT BLOOD, ED  TYPE AND SCREEN    Imaging Review No results found. I have personally reviewed and evaluated these images and lab results as part of my medical decision-making.   EKG Interpretation None      MDM   Final diagnoses:  Colitis    Patient is hemodynamically stable. Hemoglobin stable. Rectal exam negative. Will start Rx for prednisone. IV steroids given in the emergency department. He will see gastroenterologist next week  Nat Christen, MD 10/11/15 [phone]

## 2015-10-11 NOTE — ED Notes (Signed)
Patient verbalizes understanding of discharge instructions, prescriptions, home care and follow up care. Patient out of department at this time. 

## 2015-10-15 ENCOUNTER — Encounter: Payer: Self-pay | Admitting: Gastroenterology

## 2015-10-15 ENCOUNTER — Ambulatory Visit (INDEPENDENT_AMBULATORY_CARE_PROVIDER_SITE_OTHER): Payer: Medicaid Other | Admitting: Gastroenterology

## 2015-10-15 ENCOUNTER — Other Ambulatory Visit: Payer: Self-pay

## 2015-10-15 VITALS — BP 145/88 | HR 105 | Temp 98.4°F | Ht 67.0 in | Wt 234.6 lb

## 2015-10-15 DIAGNOSIS — K51911 Ulcerative colitis, unspecified with rectal bleeding: Secondary | ICD-10-CM

## 2015-10-15 MED ORDER — PEG 3350-KCL-NA BICARB-NACL 420 G PO SOLR: 4000.0000 mL | ORAL | Status: DC

## 2015-10-15 MED ORDER — MESALAMINE 1000 MG RE SUPP: 1000.0000 mg | Freq: Every day | RECTAL | Status: DC

## 2015-10-15 NOTE — Patient Instructions (Signed)
Please have blood work done today.   I sent in medicated suppositories to place in rectum each evening. This is to help topically with inflammation. Continue the prednisone dosing as ordered per the emergency room.  We have scheduled a colonoscopy with Dr. [name] to reassess his colon.

## 2015-10-17 LAB — IRON AND TIBC
%SAT: 9 % — ABNORMAL LOW (ref 15–60)
Iron: 44 ug/dL — ABNORMAL LOW (ref 50–195)
TIBC: 506 ug/dL — ABNORMAL HIGH (ref 250–425)
UIBC: 462 ug/dL — ABNORMAL HIGH (ref 125–400)

## 2015-10-17 LAB — FERRITIN: Ferritin: 11 ng/mL — ABNORMAL LOW (ref 20–345)

## 2015-10-23 NOTE — Assessment & Plan Note (Signed)
25 year old male, poor historian, presenting with persistent symptoms of rectal bleeding and diarrhea, multiple ED visits and completing 3 rounds of prednisone, currently on third round that was provided by the ED. He has not completed stool studies, stating the lab told him there was "not enough sample". On Apriso. Last colonoscopy in 2015. As he is having persistent symptoms without improvement, I have recommended early interval colonoscopy for direct visualization and biopsy. He needs topical therapy as well, and his mother states he will not likely utilize an enema. This would be the most beneficial; however, compliance remains a concern. We will provide [place] suppositories, continue Apriso, complete prednisone course, and proceed with colonoscopy.   Proceed with colonoscopy with Dr. [name] in the near future. The risks, benefits, and alternatives have been discussed in detail with the patient. They state understanding and desire to proceed.  PROPOFOL due to polypharmacy Check iron, ferritin, TIBC, due to non-specific new onset mild anemia

## 2015-10-23 NOTE — Progress Notes (Signed)
Referring Provider: Nolene Ebbs, MD Primary Care Physician:  [name], MD  Primary GI: Dr. [name]   Chief Complaint  Patient presents with  . Rectal Bleeding  . Abdominal Pain    HPI:   Wayne Avila is a 25 y.o. male presenting today with a history of likely UC, diagnosed March 2015, on Apriso. 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 Biopsies could not delineate between Crohn's and ulcerative colitis. [facility] lab test ordered and c/w UC. He has been on Apriso historically. Very poor historian.   Returns in follow-up today, with abdominal pain, persistent diarrhea, rectal bleeding and blood clots. Has not been compliant with stool studies, stating he is unable to collect enough to sample.   Received prednisone course on 08/31/15, 09/18/15, and most recently prescribed course starting Oct 11, 2015. He will improve for a very short time and then return to more diarrhea and rectal bleeding. Non-specific mild anemia with Hgb in 11/12 range, new onset. CRP and sed rate normal in March 2017. Denies NSAIDs.   Past Medical History  Diagnosis Date  . Asthma   . ADHD (attention deficit hyperactivity disorder)   . Bipolar 1 disorder ([place])   . GERD (gastroesophageal reflux disease)   . Depression   . Anxiety   . Mental developmental delay 11/15/2012  . PONV (postoperative nausea and vomiting)   . Hearing loss   . HOH (hard of hearing)   . Abdominal pain, chronic, right upper quadrant   . Colitis   . Seizures [facility])     Past Surgical History  Procedure Laterality Date  . Tonsillectomy    . Adenoidectomy    . Esophagogastroduodenoscopy (egd) with propofol N/A 12/07/2012    SLF:The mucosa of the esophagus appeared normal Non-erosive gastritis (inflammation) was found in the gastric antrum; multiple biopsies The duodenal mucosa showed no abnormalities in the bulb and second portion of the duodenum  . Biopsy N/A 12/07/2012   Procedure: GASTRIC BIOPSIES;  Surgeon: [name], MD;  Location: [facility];  Service: Endoscopy;  Laterality: N/A;  . Colonoscopy with propofol N/A 08/30/2013    JSE:GBTDVV mucosa in the terminal ileum/COLITIS/ MILD PROCTITIS. 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  Marland Kitchen Biopsy N/A 08/30/2013    Procedure: BIOPSY;  Surgeon: [name], MD;  Location: [facility];  Service: Endoscopy;  Laterality: N/A;  right colon,transverse colon, left colon, rectal biopsies    Current Outpatient Prescriptions  Medication Sig Dispense Refill  . albuterol (PROAIR HFA) 108 (90 BASE) MCG/ACT inhaler Inhale 2 puffs into the lungs every 4 (four) hours as needed for wheezing. Persistent dry cough 6.7 g 2  . atomoxetine (STRATTERA) 25 MG capsule Take 25 mg by mouth daily. Takes with the 100 mg to make 125 mg daily    . divalproex (DEPAKOTE) 500 MG DR tablet Take 1 tablet (500 mg total) by mouth 2 (two) times daily. 60 tablet 2  . EPINEPHrine 0.3 mg/0.3 mL IJ SOAJ injection Inject 0.3 mg into the muscle once.    . fluticasone (FLONASE) 50 MCG/ACT nasal spray Place 1 spray into both nostrils daily. (Patient taking differently: Place 1 spray into both nostrils daily as needed for allergies. )  5  . HYDROcodone-acetaminophen (NORCO/VICODIN) 5-325 MG tablet Take 1 tablet by mouth every 6 (six) hours as needed for moderate pain. 15 tablet 0  .  loratadine (CLARITIN) 10 MG tablet Take 10 mg by mouth at bedtime.     . mesalamine (APRISO) 0.375 G 24 hr capsule Take 4 capsules (1.5 g total) by mouth daily. 124 capsule 11  . montelukast (SINGULAIR) 10 MG tablet Take 1 tablet (10 mg total) by mouth at bedtime. 30 tablet 3  . predniSONE (DELTASONE) 10 MG tablet Take 1 tablet (10 mg total) by mouth daily with breakfast. 42 tablet 0  . ranitidine (ZANTAC) 150 MG tablet Take 1 tablet (150 mg total) by mouth daily.    .  sertraline (ZOLOFT) 100 MG tablet Take 1 tablet (100 mg total) by mouth daily. 30 tablet 0  . STRATTERA 100 MG capsule take one capsule by mouth once daily  5  . traZODone (DESYREL) 100 MG tablet Take one tablet by mouth once daily at bedtime  5  . mesalamine (CANASA) 1000 MG suppository Place 1 suppository (1,000 mg total) rectally at bedtime. 30 suppository 1  . polyethylene glycol-electrolytes (TRILYTE) 420 g solution Take 4,000 mLs by mouth as directed. 4000 mL 0   No current facility-administered medications for this visit.    Allergies as of 10/15/2015 - Review Complete 10/15/2015  Allergen Reaction Noted  . Amoxicillin-pot clavulanate Nausea And Vomiting 02/28/2011  . Ibuprofen Other (See Comments) 07/03/2015  . Omeprazole Nausea And Vomiting 04/20/2013  . Pineapple Swelling 04/14/2011  . Strawberry extract Swelling 05/08/2012  . Tomato Rash 04/14/2011    Family History  Problem Relation Age of Onset  . Asthma Mother   . Ulcers Mother   . Bipolar disorder Mother   . Colon cancer Neg Hx   . Liver disease Neg Hx   . ADD / ADHD Father   . Diabetes Maternal Grandmother   . Diabetes Maternal Grandfather     Social History   Social History  . Marital Status: Single    Spouse Name: N/A  . Number of Children: 0  . Years of Education: 12th   Occupational History  .      not working   Social History Main Topics  . Smoking status: Former Smoker -- 0.00 packs/day for 2 years    Types: Cigarettes    Quit date: 10/06/2012  . Smokeless tobacco: Never Used  . Alcohol Use: No     Comment: denies usage  . Drug Use: No  . Sexual Activity: Yes   Other Topics Concern  . None   Social History Narrative   Patient lives at home with girlfriend and family.   Caffeine Use: occasionally    Review of Systems: As mentioned in HPI.   Physical Exam: BP 145/88 mmHg  Pulse 105  Temp(Src) 98.4 F (36.9 C)  Ht 5' 7"  (1.702 m)  Wt 234 lb 9.6 oz (106.414 kg)  BMI 36.73  kg/m2 General:   Alert and oriented. Sleeping on exam table. Mother provides the majority of the information.  Head:  Normocephalic and atraumatic. Eyes:  Conjuctiva clear without scleral icterus. Mouth:  Oral mucosa pink and moist. Good dentition. No lesions. Heart:  S1, S2 present without murmurs, rubs, or gallops. Regular rate and rhythm. Abdomen:  +BS, soft, non-tender and non-distended. No rebound or guarding. No HSM or masses noted. Msk:  Symmetrical without gross deformities. Normal posture. Extremities:  Without edema. Neurologic:  Alert and  oriented x4;  grossly normal neurologically. Psych:  Alert and cooperative. Normal mood and affect.

## 2015-10-24 NOTE — Progress Notes (Signed)
CC'ED TO PCP 

## 2015-10-25 ENCOUNTER — Encounter (HOSPITAL_COMMUNITY): Payer: Self-pay

## 2015-10-25 ENCOUNTER — Emergency Department (HOSPITAL_COMMUNITY): Payer: Medicaid Other

## 2015-10-25 ENCOUNTER — Telehealth: Payer: Self-pay | Admitting: Gastroenterology

## 2015-10-25 ENCOUNTER — Emergency Department (HOSPITAL_COMMUNITY)
Admission: EM | Admit: 2015-10-25 | Discharge: 2015-10-25 | Disposition: A | Discharge status: Home / Self Care | Payer: Medicaid Other | Attending: Emergency Medicine | Admitting: Emergency Medicine

## 2015-10-25 DIAGNOSIS — Z87891 Personal history of nicotine dependence: Secondary | ICD-10-CM | POA: Diagnosis not present

## 2015-10-25 DIAGNOSIS — Z79899 Other long term (current) drug therapy: Secondary | ICD-10-CM | POA: Insufficient documentation

## 2015-10-25 DIAGNOSIS — R0789 Other chest pain: Secondary | ICD-10-CM | POA: Insufficient documentation

## 2015-10-25 DIAGNOSIS — F319 Bipolar disorder, unspecified: Secondary | ICD-10-CM | POA: Insufficient documentation

## 2015-10-25 DIAGNOSIS — J45909 Unspecified asthma, uncomplicated: Secondary | ICD-10-CM | POA: Insufficient documentation

## 2015-10-25 DIAGNOSIS — R079 Chest pain, unspecified: Secondary | ICD-10-CM

## 2015-10-25 LAB — COMPREHENSIVE METABOLIC PANEL
ALT: 18 U/L (ref 17–63)
AST: 20 U/L (ref 15–41)
Albumin: 3.7 g/dL (ref 3.5–5.0)
Alkaline Phosphatase: 57 U/L (ref 38–126)
Anion gap: 7 (ref 5–15)
BUN: 10 mg/dL (ref 6–20)
CO2: 24 mmol/L (ref 22–32)
Calcium: 8.9 mg/dL (ref 8.9–10.3)
Chloride: 104 mmol/L (ref 101–111)
Creatinine, Ser: 0.9 mg/dL (ref 0.61–1.24)
GFR calc Af Amer: >60 mL/min (ref >60)
GFR calc non Af Amer: >60 mL/min (ref >60)
Glucose, Bld: 136 mg/dL — ABNORMAL HIGH (ref 65–99)
Potassium: 4.5 mmol/L (ref 3.5–5.1)
Sodium: 135 mmol/L (ref 135–145)
Total Bilirubin: 0.1 mg/dL — ABNORMAL LOW (ref 0.3–1.2)
Total Protein: 6.9 g/dL (ref 6.5–8.1)

## 2015-10-25 LAB — CBC
HCT: 34 % — ABNORMAL LOW (ref 39.0–52.0)
Hemoglobin: 11.7 g/dL — ABNORMAL LOW (ref 13.0–17.0)
MCH: 29.6 pg (ref 26.0–34.0)
MCHC: 34.4 g/dL (ref 30.0–36.0)
MCV: 86.1 fL (ref 78.0–100.0)
Platelets: 326 10*3/uL (ref 150–400)
RBC: 3.95 MIL/uL — ABNORMAL LOW (ref 4.22–5.81)
RDW: 13.1 % (ref 11.5–15.5)
WBC: 9.7 10*3/uL (ref 4.0–10.5)

## 2015-10-25 LAB — TROPONIN I: Troponin I: <0.03 ng/mL (ref <0.031)

## 2015-10-25 LAB — D-DIMER, QUANTITATIVE: D-Dimer, Quant: 0.36 ug/mL-FEU (ref 0.00–0.50)

## 2015-10-25 MED ORDER — ACETAMINOPHEN 325 MG PO TABS
650.0000 mg | ORAL_TABLET | Freq: Once | ORAL | Status: AC
  Administered 2015-10-25: 650 mg via ORAL
  Filled 2015-10-25: qty 2

## 2015-10-25 NOTE — Discharge Instructions (Signed)

## 2015-10-25 NOTE — Telephone Encounter (Signed)
Forwarding to Laban Emperor, NP for results.

## 2015-10-25 NOTE — Telephone Encounter (Signed)
[phone]  Patient mother called inquiring on Tyrelle's lab results

## 2015-10-25 NOTE — ED Provider Notes (Signed)
CSN: [id number]     Arrival date & time 10/25/15  1931 History   First MD Initiated Contact with Patient 10/25/15 2012     Chief Complaint  Patient presents with  . Chest Pain   HPI The patient presents to the emergency room for evaluation of chest pain. He was not feeling well and thought his blood pressure was elevated .  Patient started having pain in the center and left side of his chest this evening.  He felt like it was a sharp pain as well as a discomfort like someone was punching him in the chest. Symptoms started about 45 minutes prior to his arrival to the emergency room. He is feeling short of breath. He denies having any numbness or weakness. Past Medical History  Diagnosis Date  . Asthma   . ADHD (attention deficit hyperactivity disorder)   . Bipolar 1 disorder ([place])   . GERD (gastroesophageal reflux disease)   . Depression   . Anxiety   . Mental developmental delay 11/15/2012  . PONV (postoperative nausea and vomiting)   . Hearing loss   . HOH (hard of hearing)   . Abdominal pain, chronic, right upper quadrant   . Colitis   . Seizures [facility])    Past Surgical History  Procedure Laterality Date  . Tonsillectomy    . Adenoidectomy    . Esophagogastroduodenoscopy (egd) with propofol N/A 12/07/2012    SLF:The mucosa of the esophagus appeared normal Non-erosive gastritis (inflammation) was found in the gastric antrum; multiple biopsies The duodenal mucosa showed no abnormalities in the bulb and second portion of the duodenum  . Biopsy N/A 12/07/2012    Procedure: GASTRIC BIOPSIES;  Surgeon: [name], MD;  Location: [facility];  Service: Endoscopy;  Laterality: N/A;  . Colonoscopy with propofol N/A 08/30/2013    MGN:OIBBCW mucosa in the terminal ileum/COLITIS/ MILD PROCTITIS. 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  Marland Kitchen Biopsy N/A 08/30/2013    Procedure: BIOPSY;   Surgeon: [name], MD;  Location: [facility];  Service: Endoscopy;  Laterality: N/A;  right colon,transverse colon, left colon, rectal biopsies   Family History  Problem Relation Age of Onset  . Asthma Mother   . Ulcers Mother   . Bipolar disorder Mother   . Colon cancer Neg Hx   . Liver disease Neg Hx   . ADD / ADHD Father   . Diabetes Maternal Grandmother   . Diabetes Maternal Grandfather    Social History  Substance Use Topics  . Smoking status: Former Smoker -- 0.00 packs/day for 2 years    Quit date: 10/06/2012  . Smokeless tobacco: Never Used  . Alcohol Use: No     Comment: denies usage    Review of Systems  All other systems reviewed and are negative.     Allergies  Amoxicillin-pot clavulanate; Ibuprofen; Omeprazole; Pineapple; Strawberry extract; and Tomato  Home Medications   Prior to Admission medications   Medication Sig Start Date End Date Taking? Authorizing Provider  albuterol (PROAIR HFA) 108 (90 BASE) MCG/ACT inhaler Inhale 2 puffs into the lungs every 4 (four) hours as needed for wheezing. Persistent dry cough 09/21/13  Yes Alethea Johnathan Hausen, MD  atomoxetine (STRATTERA) 25 MG capsule Take 25 mg by mouth daily. Takes with the 100 mg to make 125 mg daily   Yes Historical Provider, MD  divalproex (DEPAKOTE) 500 MG DR tablet Take 1  tablet (500 mg total) by mouth 2 (two) times daily. 07/03/15  Yes Penni Bombard, MD  EPINEPHrine 0.3 mg/0.3 mL IJ SOAJ injection Inject 0.3 mg into the muscle once.   Yes Historical Provider, MD  fluticasone (FLONASE) 50 MCG/ACT nasal spray Place 1 spray into both nostrils daily. Patient taking differently: Place 1 spray into both nostrils daily as needed for allergies.  11/13/14  Yes [name], NP  HYDROcodone-acetaminophen (NORCO/VICODIN) 5-325 MG tablet Take 1 tablet by mouth every 6 (six) hours as needed for moderate pain. 09/18/15  Yes Rolland Porter, MD  loratadine (CLARITIN) 10 MG tablet Take 10 mg by mouth at bedtime.    Yes  Historical Provider, MD  mesalamine (APRISO) 0.375 G 24 hr capsule Take 4 capsules (1.5 g total) by mouth daily. 11/13/14  Yes [name], NP  mesalamine (CANASA) 1000 MG suppository Place 1 suppository (1,000 mg total) rectally at bedtime. 10/15/15  Yes Orvil Feil, NP  montelukast (SINGULAIR) 10 MG tablet Take 1 tablet (10 mg total) by mouth at bedtime. 11/13/14  Yes [name], NP  predniSONE (DELTASONE) 10 MG tablet Take 1 tablet (10 mg total) by mouth daily with breakfast. 10/11/15  Yes Nat Christen, MD  ranitidine (ZANTAC) 150 MG tablet Take 1 tablet (150 mg total) by mouth daily. 11/13/14  Yes [name], NP  sertraline (ZOLOFT) 100 MG tablet Take 1 tablet (100 mg total) by mouth daily. 11/13/14  Yes [name], NP  STRATTERA 100 MG capsule take one capsule by mouth once daily 08/09/15  Yes Historical Provider, MD  traZODone (DESYREL) 100 MG tablet Take one tablet by mouth once daily at bedtime 08/07/15  Yes Historical Provider, MD  polyethylene glycol-electrolytes (TRILYTE) 420 g solution Take 4,000 mLs by mouth as directed. 10/15/15   [name], MD   BP 138/91 mmHg  Pulse 111  Temp(Src) 99.2 F (37.3 C) (Oral)  Resp 17  Ht 5' 7"  (1.702 m)  Wt 104.327 kg  BMI 36.01 kg/m2  SpO2 100% Physical Exam  Constitutional: He appears well-developed and well-nourished. No distress.  HENT:  Head: Normocephalic and atraumatic.  Right Ear: External ear normal.  Left Ear: External ear normal.  Eyes: Conjunctivae are normal. Right eye exhibits no discharge. Left eye exhibits no discharge. No scleral icterus.  Neck: Neck supple. No tracheal deviation present.  Cardiovascular: Normal rate, regular rhythm and intact distal pulses.   Pulmonary/Chest: Effort normal and breath sounds normal. No stridor. No respiratory distress. He has no wheezes. He has no rales. He exhibits tenderness.  Abdominal: Soft. Bowel sounds are normal. He exhibits no distension. There is no tenderness. There is no rebound and  no guarding.  Musculoskeletal: He exhibits no edema or tenderness.  Neurological: He is alert. He has normal strength. No cranial nerve deficit (no facial droop, extraocular movements intact, no slurred speech) or sensory deficit. He exhibits normal muscle tone. He displays no seizure activity. Coordination normal.  Skin: Skin is warm and dry. No rash noted.  Psychiatric: He has a normal mood and affect.  Nursing note and vitals reviewed.   ED Course  Procedures (including critical care time) Labs Review Labs Reviewed  CBC - Abnormal; Notable for the following:    RBC 3.95 (*)    Hemoglobin 11.7 (*)    HCT 34.0 (*)    All other components within normal limits  COMPREHENSIVE METABOLIC PANEL - Abnormal; Notable for the following:    Glucose, Bld  136 (*)    Total Bilirubin 0.1 (*)    All other components within normal limits  TROPONIN I  D-DIMER, QUANTITATIVE (NOT AT [facility])    Imaging Review Dg Chest 2 View  10/25/2015  CLINICAL DATA:  25 year old male with left-sided chest pain EXAM: CHEST  2 VIEW COMPARISON:  Radiograph dated 11/26/2014 FINDINGS: Two views of the chest demonstrate shallow inspiration with bibasilar atelectatic changes. There is no focal consolidation, pleural effusion, or pneumothorax. The cardiac silhouette is within normal limits. No acute osseous pathology identified. IMPRESSION: No active cardiopulmonary disease. Electronically Signed   By: Anner Crete M.D.   On: 10/25/2015 21:24   I have personally reviewed and evaluated these images and lab results as part of my medical decision-making.   EKG Interpretation   Date/Time:  Thursday Oct 25 2015 19:46:32 EDT Ventricular Rate:  109 PR Interval:  120 QRS Duration: 82 QT Interval:  308 QTC Calculation: 414 R Axis:   5 Text Interpretation:  Sinus tachycardia Nonspecific T wave abnormality  Abnormal ECG No previous tracing Reconfirmed by Brooklyne Radke  MD-J, Talene Glastetter ([id number])  on 10/25/2015 9:27:21 PM      MDM    Final diagnoses:  Chest pain with low risk for cardiac etiology    Chest pain is atypical for cardiac disease.  Pt is low risk.  Cardiac enzymes and EKG are reasuring.  D dimer is negative.  Doubt PE.  At this time there does not appear to be any evidence of an acute emergency medical condition and the patient appears stable for discharge with appropriate outpatient follow up.     Dorie Rank, MD 10/25/15 2147

## 2015-10-25 NOTE — ED Notes (Signed)
Pt reports that he has not felt good for several days. Reports chest started hurting approx 45 mins ago. Pt was laying on sofa when pain started. Pt reports that he has been GI due to blood in stools. Labs drawn 10/16/15 and no results yet. Pt reports that stools are brown

## 2015-10-26 NOTE — Progress Notes (Signed)
Quick Note:  Patient has low iron and low ferritin, consistent with IDA. This is likely due to known IBD. He is already scheduled for a colonoscopy. With new onset IDA, likely need to add possible EGD at time of colonoscopy if at all possible. I highly doubt anything is going on in his upper GI tract, and these results are likely secondary to known IBD. If possible, can we add an EGD to colonoscopy. ______

## 2015-10-26 NOTE — Progress Notes (Signed)
Quick Note:  Pt's mom is aware and OK to add the EGD. ______

## 2015-10-26 NOTE — Telephone Encounter (Signed)
Pt's mom called and said she can see the results in [facility] but she does not understand the values of the other abnormals that are listed with the iron.

## 2015-10-26 NOTE — Telephone Encounter (Signed)
Please see result notes.

## 2015-10-26 NOTE — Telephone Encounter (Signed)
Noted  

## 2015-10-29 ENCOUNTER — Other Ambulatory Visit: Payer: Self-pay

## 2015-10-30 ENCOUNTER — Encounter (HOSPITAL_COMMUNITY): Payer: Self-pay | Admitting: Emergency Medicine

## 2015-10-30 ENCOUNTER — Emergency Department (HOSPITAL_COMMUNITY): Payer: Medicaid Other

## 2015-10-30 ENCOUNTER — Emergency Department (HOSPITAL_COMMUNITY)
Admission: EM | Admit: 2015-10-30 | Discharge: 2015-10-30 | Disposition: A | Discharge status: Home / Self Care | Payer: Medicaid Other | Attending: Emergency Medicine | Admitting: Emergency Medicine

## 2015-10-30 DIAGNOSIS — F319 Bipolar disorder, unspecified: Secondary | ICD-10-CM | POA: Diagnosis not present

## 2015-10-30 DIAGNOSIS — R072 Precordial pain: Secondary | ICD-10-CM | POA: Diagnosis present

## 2015-10-30 DIAGNOSIS — Z87891 Personal history of nicotine dependence: Secondary | ICD-10-CM | POA: Insufficient documentation

## 2015-10-30 DIAGNOSIS — J45909 Unspecified asthma, uncomplicated: Secondary | ICD-10-CM | POA: Insufficient documentation

## 2015-10-30 DIAGNOSIS — R079 Chest pain, unspecified: Secondary | ICD-10-CM

## 2015-10-30 LAB — COMPREHENSIVE METABOLIC PANEL
ALT: 19 U/L (ref 17–63)
AST: 20 U/L (ref 15–41)
Albumin: 3.7 g/dL (ref 3.5–5.0)
Alkaline Phosphatase: 68 U/L (ref 38–126)
Anion gap: 7 (ref 5–15)
BUN: 14 mg/dL (ref 6–20)
CO2: 26 mmol/L (ref 22–32)
Calcium: 9 mg/dL (ref 8.9–10.3)
Chloride: 104 mmol/L (ref 101–111)
Creatinine, Ser: 0.82 mg/dL (ref 0.61–1.24)
GFR calc Af Amer: >60 mL/min (ref >60)
GFR calc non Af Amer: >60 mL/min (ref >60)
Glucose, Bld: 97 mg/dL (ref 65–99)
Potassium: 4.1 mmol/L (ref 3.5–5.1)
Sodium: 137 mmol/L (ref 135–145)
Total Bilirubin: 0.4 mg/dL (ref 0.3–1.2)
Total Protein: 6.9 g/dL (ref 6.5–8.1)

## 2015-10-30 LAB — CBC WITH DIFFERENTIAL/PLATELET
Basophils Absolute: 0 10*3/uL (ref 0.0–0.1)
Basophils Relative: 0 %
Eosinophils Absolute: 0.1 10*3/uL (ref 0.0–0.7)
Eosinophils Relative: 1 %
HCT: 36 % — ABNORMAL LOW (ref 39.0–52.0)
Hemoglobin: 12.1 g/dL — ABNORMAL LOW (ref 13.0–17.0)
Lymphocytes Relative: 21 %
Lymphs Abs: 2.4 10*3/uL (ref 0.7–4.0)
MCH: 28.9 pg (ref 26.0–34.0)
MCHC: 33.6 g/dL (ref 30.0–36.0)
MCV: 85.9 fL (ref 78.0–100.0)
Monocytes Absolute: 1.5 10*3/uL — ABNORMAL HIGH (ref 0.1–1.0)
Monocytes Relative: 14 %
Neutro Abs: 7.1 10*3/uL (ref 1.7–7.7)
Neutrophils Relative %: 64 %
Platelets: 322 10*3/uL (ref 150–400)
RBC: 4.19 MIL/uL — ABNORMAL LOW (ref 4.22–5.81)
RDW: 13.2 % (ref 11.5–15.5)
WBC: 11.2 10*3/uL — ABNORMAL HIGH (ref 4.0–10.5)

## 2015-10-30 MED ORDER — GI COCKTAIL ~~LOC~~
30.0000 mL | Freq: Once | ORAL | Status: AC
  Administered 2015-10-30: 30 mL via ORAL
  Filled 2015-10-30: qty 30

## 2015-10-30 MED ORDER — SODIUM CHLORIDE 0.9 % IV BOLUS (SEPSIS)
1000.0000 mL | Freq: Once | INTRAVENOUS | Status: AC
  Administered 2015-10-30: 1000 mL via INTRAVENOUS

## 2015-10-30 MED ORDER — HYDROCODONE-ACETAMINOPHEN 5-325 MG PO TABS
1.0000 | ORAL_TABLET | Freq: Once | ORAL | Status: AC
  Administered 2015-10-30: 1 via ORAL
  Filled 2015-10-30: qty 1

## 2015-10-30 NOTE — ED Notes (Signed)
Awaiting disposition.

## 2015-10-30 NOTE — ED Notes (Signed)
Has a bad headache, as well as CP- Was seen here earlier in the month and has an appointment with Dr Percell Miller on [address]

## 2015-10-30 NOTE — ED Notes (Signed)
Pt c/o sharp chest pain that started about one hour ago.

## 2015-10-30 NOTE — ED Notes (Signed)
Pt reports feeling much better.

## 2015-10-30 NOTE — ED Notes (Signed)
Supper chicken wings, mac and cheese

## 2015-10-30 NOTE — ED Provider Notes (Signed)
CSN: [id number]     Arrival date & time 10/30/15  0001 History  By signing my name below, I, Doran Stabler, attest that this documentation has been prepared under the direction and in the presence of No att. providers found. Electronically Signed: Doran Stabler, ED Scribe. 10/30/2015. 12:42 AM.   Chief Complaint  Patient presents with  . Chest Pain   The history is provided by the patient. No language interpreter was used.   HPI Comments: Wayne Avila is a 25 y.o. male with a PMHx of anemia who presents to the Emergency Department with his mother complaining of ongoing sharp substernal chest pain. He also reports HA. Pt was seen for these symptoms 4 days ago and had a benign visit. Pt took Tylenol with no relief. Pt denies any fevers, chills, SOB, N/V/D, rashes or any other symptom at this time.    Pt has an appointment with PCP to be evaluated for these symptom on 11/01/15.   Past Medical History  Diagnosis Date  . Asthma   . ADHD (attention deficit hyperactivity disorder)   . Bipolar 1 disorder ([place])   . GERD (gastroesophageal reflux disease)   . Depression   . Anxiety   . Mental developmental delay 11/15/2012  . PONV (postoperative nausea and vomiting)   . Hearing loss   . HOH (hard of hearing)   . Abdominal pain, chronic, right upper quadrant   . Colitis   . Seizures [facility])    Past Surgical History  Procedure Laterality Date  . Tonsillectomy    . Adenoidectomy    . Esophagogastroduodenoscopy (egd) with propofol N/A 12/07/2012    SLF:The mucosa of the esophagus appeared normal Non-erosive gastritis (inflammation) was found in the gastric antrum; multiple biopsies The duodenal mucosa showed no abnormalities in the bulb and second portion of the duodenum  . Biopsy N/A 12/07/2012    Procedure: GASTRIC BIOPSIES;  Surgeon: [name], MD;  Location: [facility];  Service: Endoscopy;  Laterality: N/A;  . Colonoscopy with propofol N/A 08/30/2013    MKL:KJZPHX mucosa in the terminal  ileum/COLITIS/ MILD PROCTITIS. 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  Marland Kitchen Biopsy N/A 08/30/2013    Procedure: BIOPSY;  Surgeon: [name], MD;  Location: [facility];  Service: Endoscopy;  Laterality: N/A;  right colon,transverse colon, left colon, rectal biopsies   Family History  Problem Relation Age of Onset  . Asthma Mother   . Ulcers Mother   . Bipolar disorder Mother   . Colon cancer Neg Hx   . Liver disease Neg Hx   . ADD / ADHD Father   . Diabetes Maternal Grandmother   . Diabetes Maternal Grandfather    Social History  Substance Use Topics  . Smoking status: Former Smoker -- 0.00 packs/day for 2 years    Quit date: 10/06/2012  . Smokeless tobacco: Never Used  . Alcohol Use: No     Comment: denies usage    Review of Systems  Constitutional: Negative for fever and chills.  Respiratory: Negative for shortness of breath.   Cardiovascular: Positive for chest pain.  Gastrointestinal: Negative for nausea, vomiting, abdominal pain and diarrhea.  Skin: Negative for rash.  All other systems reviewed and are negative.   Allergies  Amoxicillin-pot clavulanate; Ibuprofen; Omeprazole; Pineapple; Strawberry extract; and Tomato  Home Medications   Prior to Admission medications   Medication Sig Start Date End Date Taking?  Authorizing Provider  albuterol (PROAIR HFA) 108 (90 BASE) MCG/ACT inhaler Inhale 2 puffs into the lungs every 4 (four) hours as needed for wheezing. Persistent dry cough 09/21/13   Sandi Mealy, MD  atomoxetine (STRATTERA) 25 MG capsule Take 25 mg by mouth daily. Takes with the 100 mg to make 125 mg daily    Historical Provider, MD  divalproex (DEPAKOTE) 500 MG DR tablet Take 1 tablet (500 mg total) by mouth 2 (two) times daily. 07/03/15   Penni Bombard, MD  EPINEPHrine 0.3 mg/0.3 mL IJ SOAJ injection Inject 0.3 mg into the muscle once.     Historical Provider, MD  fluticasone (FLONASE) 50 MCG/ACT nasal spray Place 1 spray into both nostrils daily. Patient taking differently: Place 1 spray into both nostrils daily as needed for allergies.  11/13/14   [name], NP  HYDROcodone-acetaminophen (NORCO/VICODIN) 5-325 MG tablet Take 1 tablet by mouth every 6 (six) hours as needed for moderate pain. 09/18/15   Rolland Porter, MD  loratadine (CLARITIN) 10 MG tablet Take 10 mg by mouth at bedtime.     Historical Provider, MD  mesalamine (APRISO) 0.375 G 24 hr capsule Take 4 capsules (1.5 g total) by mouth daily. 11/13/14   [name], NP  mesalamine (CANASA) 1000 MG suppository Place 1 suppository (1,000 mg total) rectally at bedtime. 10/15/15   Orvil Feil, NP  montelukast (SINGULAIR) 10 MG tablet Take 1 tablet (10 mg total) by mouth at bedtime. 11/13/14   [name], NP  polyethylene glycol-electrolytes (TRILYTE) 420 g solution Take 4,000 mLs by mouth as directed. 10/15/15   [name], MD  predniSONE (DELTASONE) 10 MG tablet Take 1 tablet (10 mg total) by mouth daily with breakfast. 10/11/15   Nat Christen, MD  ranitidine (ZANTAC) 150 MG tablet Take 1 tablet (150 mg total) by mouth daily. 11/13/14   [name], NP  sertraline (ZOLOFT) 100 MG tablet Take 1 tablet (100 mg total) by mouth daily. 11/13/14   [name], NP  STRATTERA 100 MG capsule take one capsule by mouth once daily 08/09/15   Historical Provider, MD  traZODone (DESYREL) 100 MG tablet Take one tablet by mouth once daily at bedtime 08/07/15   Historical Provider, MD   BP 129/83 mmHg  Pulse 83  Temp(Src) 98.2 F (36.8 C) (Oral)  Resp 16  Ht 5' 7"  (1.702 m)  Wt 230 lb (104.327 kg)  BMI 36.01 kg/m2  SpO2 100%   Physical Exam  Constitutional: He is oriented to person, place, and time. He appears well-developed and well-nourished.  HENT:  Head: Normocephalic and atraumatic.  Cardiovascular: Normal rate, regular rhythm and normal heart sounds.   Pulmonary/Chest: Effort normal.  No respiratory distress.  Upper sternal TTP; no left or right CW tenderness  Abdominal: Soft. There is no tenderness.  Neurological: He is alert and oriented to person, place, and time.  Skin: Skin is warm and dry. No rash noted.  Psychiatric: He has a normal mood and affect.  Nursing note and vitals reviewed.   ED Course  Procedures  DIAGNOSTIC STUDIES: Oxygen Saturation is 99% on room air, normal by my interpretation.    COORDINATION OF CARE: 12:41 AM Will give fluids, pain medication and GI Cocktail. Will order CXR, EKG, and blood work. Discussed treatment plan with pt and mother at bedside and they agreed to plan.  Labs Review Labs Reviewed  CBC WITH DIFFERENTIAL/PLATELET - Abnormal; Notable for the following:  WBC 11.2 (*)    RBC 4.19 (*)    Hemoglobin 12.1 (*)    HCT 36.0 (*)    Monocytes Absolute 1.5 (*)    All other components within normal limits  COMPREHENSIVE METABOLIC PANEL    Imaging Review Dg Chest 2 View  10/30/2015  CLINICAL DATA:  Substernal chest pain. EXAM: CHEST  2 VIEW COMPARISON:  10/25/2015 FINDINGS: Persistent low lung volumes. The cardiomediastinal contours are normal. The lungs are clear. Pulmonary vasculature is normal. No consolidation, pleural effusion, or pneumothorax. No acute osseous abnormalities are seen. IMPRESSION: Hypoventilatory chest without acute process. Electronically Signed   By: Jeb Levering M.D.   On: 10/30/2015 03:43   I have personally reviewed and evaluated these images and lab results as part of my medical decision-making.   EKG Interpretation   Date/Time:  Tuesday Oct 30 2015 00:11:12 EDT Ventricular Rate:  100 PR Interval:  124 QRS Duration: 74 QT Interval:  293 QTC Calculation: 378 R Axis:   42 Text Interpretation:  Sinus tachycardia Borderline repolarization  abnormality No significant change since last tracing on May 18 Confirmed  by [facility] MD, Corene Cornea [phone]) on 10/30/2015 12:18:27 AM      MDM   Final  diagnoses:  Chest pain, unspecified chest pain type    25 yo w/ atypical chest pain for ACS, no troponins needed. Also PERC negative, doubt PE. Symptoms likely related to GI pathology as symptoms improved almost completely with gi cocktail and norco. Observed for multiple more hours and patient without recurrence of symptoms.   New Prescriptions: Discharge Medication List as of 10/30/2015  3:50 AM       I have personally and contemperaneously reviewed labs and imaging and used in my decision making as above.   A medical screening exam was performed and I feel the patient has had an appropriate workup for their chief complaint at this time and likelihood of emergent condition existing is low and thus workup can continue on an outpatient basis.. Their vital signs are stable. They have been counseled on decision, discharge, follow up and which symptoms necessitate immediate return to the emergency department.  They verbally stated understanding and agreement with plan and discharged in stable condition.    I personally performed the services described in this documentation, which was scribed in my presence. The recorded information has been reviewed and is accurate.     Merrily Pew, MD 10/30/15 [phone]

## 2015-11-08 ENCOUNTER — Other Ambulatory Visit: Payer: Self-pay | Admitting: Gastroenterology

## 2015-11-15 ENCOUNTER — Encounter (HOSPITAL_COMMUNITY): Payer: Self-pay

## 2015-11-15 NOTE — Patient Instructions (Signed)
Wayne Avila  11/15/2015     @PREFPERIOPPHARMACY @   Your procedure is scheduled on  11/20/2015   Report to [facility] at  1130  A.M.  Call this number if you have problems the morning of surgery:  [phone]   Remember:  Do not eat food or drink liquids after midnight.  Take these medicines the morning of surgery with A SIP OF WATER  Strattera, depakote, hydrocodone, claritin, apriso, singulair, prednisone, zantac, zolft. Take your inhalers before you come and bring them with you.   Do not wear jewelry, make-up or nail polish.  Do not wear lotions, powders, or perfumes.  You may wear deodorant.  Do not shave 48 hours prior to surgery.  Men may shave face and neck.  Do not bring valuables to the hospital.  [facility] is not responsible for any belongings or valuables.  Contacts, dentures or bridgework may not be worn into surgery.  Leave your suitcase in the car.  After surgery it may be brought to your room.  For patients admitted to the hospital, discharge time will be determined by your treatment team.  Patients discharged the day of surgery will not be allowed to drive home.   Name and phone number of your driver:   family Special instructions:  Follow the diet and prep instructions given to you by Dr Nona Dell office.  Please read over the following fact sheets that you were given. Coughing and Deep Breathing, Surgical Site Infection Prevention, Anesthesia Post-op Instructions and Care and Recovery After Surgery      Esophagogastroduodenoscopy Esophagogastroduodenoscopy (EGD) is a procedure that is used to examine the lining of the esophagus, stomach, and first part of the small intestine (duodenum). A long, flexible, lighted tube with a camera attached (endoscope) is inserted down the throat to view these organs. This procedure is done to detect problems or abnormalities, such as inflammation, bleeding, ulcers, or growths, in order to treat them. The  procedure lasts 5-20 minutes. It is usually an outpatient procedure, but it may need to be performed in a hospital in emergency cases. LET [facility] CARE PROVIDER KNOW ABOUT:  Any allergies you have.  All medicines you are taking, including vitamins, herbs, eye drops, creams, and over-the-counter medicines.  Previous problems you or members of your family have had with the use of anesthetics.  Any blood disorders you have.  Previous surgeries you have had.  Medical conditions you have. RISKS AND COMPLICATIONS Generally, this is a safe procedure. However, problems can occur and include:  Infection.  Bleeding.  Tearing (perforation) of the esophagus, stomach, or duodenum.  Difficulty breathing or not being able to breathe.  Excessive sweating.  Spasms of the larynx.  Slowed heartbeat.  Low blood pressure. BEFORE THE PROCEDURE  Do not eat or drink anything after midnight on the night before the procedure or as directed by your health care provider.  Do not take your regular medicines before the procedure if your health care provider asks you not to. Ask your health care provider about changing or stopping those medicines.  If you wear dentures, be prepared to remove them before the procedure.  Arrange for someone to drive you home after the procedure. PROCEDURE  A numbing medicine (local anesthetic) may be sprayed in your throat for comfort and to stop you from gagging or coughing.  You will have an IV tube inserted in a vein in your hand or arm.  You will receive medicines and fluids through this tube.  You will be given a medicine to relax you (sedative).  A pain reliever will be given through the IV tube.  A mouth guard may be placed in your mouth to protect your teeth and to keep you from biting on the endoscope.  You will be asked to lie on your left side.  The endoscope will be inserted down your throat and into your esophagus, stomach, and duodenum.  Air  will be put through the endoscope to allow your health care provider to clearly view the lining of your esophagus.  The lining of your esophagus, stomach, and duodenum will be examined. During the exam, your health care provider may:  Remove tissue to be examined under a microscope (biopsy) for inflammation, infection, or other medical problems.  Remove growths.  Remove objects (foreign bodies) that are stuck.  Treat any bleeding with medicines or other devices that stop tissues from bleeding (hot cautery, clipping devices).  Widen (dilate) or stretch narrowed areas of your esophagus and stomach.  The endoscope will be withdrawn. AFTER THE PROCEDURE  You will be taken to a recovery area for observation. Your blood pressure, heart rate, breathing rate, and blood oxygen level will be monitored often until the medicines you were given have worn off.  Do not eat or drink anything until the numbing medicine has worn off and your gag reflex has returned. You may choke.  Your health care provider should be able to discuss his or her findings with you. It will take longer to discuss the test results if any biopsies were taken.   This information is not intended to replace advice given to you by your health care provider. Make sure you discuss any questions you have with your health care provider.   Document Released: 09/26/2004 Document Revised: 06/16/2014 Document Reviewed: 04/28/2012 Elsevier Interactive Patient Education 2016 [place]. Esophagogastroduodenoscopy, Care After Refer to this sheet in the next few weeks. These instructions provide you with information about caring for yourself after your procedure. Your health care provider may also give you more specific instructions. Your treatment has been planned according to current medical practices, but problems sometimes occur. Call your health care provider if you have any problems or questions after your procedure. WHAT TO EXPECT  AFTER THE PROCEDURE After your procedure, it is typical to feel:  Soreness in your throat.  Pain with swallowing.  Sick to your stomach (nauseous).  Bloated.  Dizzy.  Fatigued. HOME CARE INSTRUCTIONS  Do not eat or drink anything until the numbing medicine (local anesthetic) has worn off and your gag reflex has returned. You will know that the local anesthetic has worn off when you can swallow comfortably.  Do not drive or operate machinery until directed by your health care provider.  Take medicines only as directed by your health care provider. SEEK MEDICAL CARE IF:   You cannot stop coughing.  You are not urinating at all or less than usual. SEEK IMMEDIATE MEDICAL CARE IF:  You have difficulty swallowing.  You cannot eat or drink.  You have worsening throat or chest pain.  You have dizziness or lightheadedness or you faint.  You have nausea or vomiting.  You have chills.  You have a fever.  You have severe abdominal pain.  You have black, tarry, or bloody stools.   This information is not intended to replace advice given to you by your health care provider. Make sure you discuss any  questions you have with your health care provider.   Document Released: 05/12/2012 Document Revised: 06/16/2014 Document Reviewed: 05/12/2012 Elsevier Interactive Patient Education 2016 [employer]. Colonoscopy A colonoscopy is an exam to look at the entire large intestine (colon). This exam can help find problems such as tumors, polyps, inflammation, and areas of bleeding. The exam takes about 1 hour.  LET [facility] CARE PROVIDER KNOW ABOUT:   Any allergies you have.  All medicines you are taking, including vitamins, herbs, eye drops, creams, and over-the-counter medicines.  Previous problems you or members of your family have had with the use of anesthetics.  Any blood disorders you have.  Previous surgeries you have had.  Medical conditions you have. RISKS AND  COMPLICATIONS  Generally, this is a safe procedure. However, as with any procedure, complications can occur. Possible complications include:  Bleeding.  Tearing or rupture of the colon wall.  Reaction to medicines given during the exam.  Infection (rare). BEFORE THE PROCEDURE   Ask your health care provider about changing or stopping your regular medicines.  You may be prescribed an oral bowel prep. This involves drinking a large amount of medicated liquid, starting the day before your procedure. The liquid will cause you to have multiple loose stools until your stool is almost clear or light green. This cleans out your colon in preparation for the procedure.  Do not eat or drink anything else once you have started the bowel prep, unless your health care provider tells you it is safe to do so.  Arrange for someone to drive you home after the procedure. PROCEDURE   You will be given medicine to help you relax (sedative).  You will lie on your side with your knees bent.  A long, flexible tube with a light and camera on the end (colonoscope) will be inserted through the rectum and into the colon. The camera sends video back to a computer screen as it moves through the colon. The colonoscope also releases carbon dioxide gas to inflate the colon. This helps your health care provider see the area better.  During the exam, your health care provider may take a small tissue sample (biopsy) to be examined under a microscope if any abnormalities are found.  The exam is finished when the entire colon has been viewed. AFTER THE PROCEDURE   Do not drive for 24 hours after the exam.  You may have a small amount of blood in your stool.  You may pass moderate amounts of gas and have mild abdominal cramping or bloating. This is caused by the gas used to inflate your colon during the exam.  Ask when your test results will be ready and how you will get your results. Make sure you get your test  results.   This information is not intended to replace advice given to you by your health care provider. Make sure you discuss any questions you have with your health care provider.   Document Released: 05/23/2000 Document Revised: 03/16/2013 Document Reviewed: 01/31/2013 Elsevier Interactive Patient Education 2016 [employer] Colonoscopy, Care After Refer to this sheet in the next few weeks. These instructions provide you with information on caring for yourself after your procedure. Your health care provider may also give you more specific instructions. Your treatment has been planned according to current medical practices, but problems sometimes occur. Call your health care provider if you have any problems or questions after your procedure. WHAT TO EXPECT AFTER THE PROCEDURE  After your procedure,  it is typical to have the following:  A small amount of blood in your stool.  Moderate amounts of gas and mild abdominal cramping or bloating. HOME CARE INSTRUCTIONS  Do not drive, operate machinery, or sign important documents for 24 hours.  You may shower and resume your regular physical activities, but move at a slower pace for the first 24 hours.  Take frequent rest periods for the first 24 hours.  Walk around or put a warm pack on your abdomen to help reduce abdominal cramping and bloating.  Drink enough fluids to keep your urine clear or pale yellow.  You may resume your normal diet as instructed by your health care provider. Avoid heavy or fried foods that are hard to digest.  Avoid drinking alcohol for 24 hours or as instructed by your health care provider.  Only take over-the-counter or prescription medicines as directed by your health care provider.  If a tissue sample (biopsy) was taken during your procedure:  Do not take aspirin or blood thinners for 7 days, or as instructed by your health care provider.  Do not drink alcohol for 7 days, or as instructed by your health  care provider.  Eat soft foods for the first 24 hours. SEEK MEDICAL CARE IF: You have persistent spotting of blood in your stool 2-3 days after the procedure. SEEK IMMEDIATE MEDICAL CARE IF:  You have more than a small spotting of blood in your stool.  You pass large blood clots in your stool.  Your abdomen is swollen (distended).  You have nausea or vomiting.  You have a fever.  You have increasing abdominal pain that is not relieved with medicine.   This information is not intended to replace advice given to you by your health care provider. Make sure you discuss any questions you have with your health care provider.   Document Released: 01/08/2004 Document Revised: 03/16/2013 Document Reviewed: 01/31/2013 Elsevier Interactive Patient Education 2016 [employer] PATIENT INSTRUCTIONS POST-ANESTHESIA  IMMEDIATELY FOLLOWING SURGERY:  Do not drive or operate machinery for the first twenty four hours after surgery.  Do not make any important decisions for twenty four hours after surgery or while taking narcotic pain medications or sedatives.  If you develop intractable nausea and vomiting or a severe headache please notify your doctor immediately.  FOLLOW-UP:  Please make an appointment with your surgeon as instructed. You do not need to follow up with anesthesia unless specifically instructed to do so.  WOUND CARE INSTRUCTIONS (if applicable):  Keep a dry clean dressing on the anesthesia/puncture wound site if there is drainage.  Once the wound has quit draining you may leave it open to air.  Generally you should leave the bandage intact for twenty four hours unless there is drainage.  If the epidural site drains for more than 36-48 hours please call the anesthesia department.  QUESTIONS?:  Please feel free to call your physician or the hospital operator if you have any questions, and they will be happy to assist you.

## 2015-11-16 ENCOUNTER — Other Ambulatory Visit (HOSPITAL_COMMUNITY): Payer: Self-pay

## 2015-11-16 ENCOUNTER — Encounter (HOSPITAL_COMMUNITY)
Admission: RE | Admit: 2015-11-16 | Discharge: 2015-11-16 | Disposition: A | Discharge status: Home / Self Care | Payer: Medicaid Other | Source: Ambulatory Visit | Attending: Gastroenterology | Admitting: Gastroenterology

## 2015-11-19 ENCOUNTER — Other Ambulatory Visit: Payer: Self-pay

## 2015-11-20 ENCOUNTER — Ambulatory Visit (HOSPITAL_COMMUNITY): Payer: Medicaid Other | Admitting: Anesthesiology

## 2015-11-20 ENCOUNTER — Encounter (HOSPITAL_COMMUNITY)
Admission: RE | Disposition: A | Discharge status: Home / Self Care | Payer: Self-pay | Source: Ambulatory Visit | Attending: Gastroenterology

## 2015-11-20 ENCOUNTER — Encounter (HOSPITAL_COMMUNITY): Admission: RE | Payer: Self-pay | Source: Ambulatory Visit

## 2015-11-20 ENCOUNTER — Ambulatory Visit (HOSPITAL_COMMUNITY): Admission: RE | Admit: 2015-11-20 | Payer: Medicaid Other | Source: Ambulatory Visit | Admitting: Gastroenterology

## 2015-11-20 ENCOUNTER — Ambulatory Visit (HOSPITAL_BASED_OUTPATIENT_CLINIC_OR_DEPARTMENT_OTHER)
Admission: RE | Admit: 2015-11-20 | Discharge: 2015-11-20 | Disposition: A | Discharge status: Home / Self Care | Payer: Medicaid Other | Source: Ambulatory Visit | Attending: Gastroenterology | Admitting: Gastroenterology

## 2015-11-20 DIAGNOSIS — F418 Other specified anxiety disorders: Secondary | ICD-10-CM

## 2015-11-20 DIAGNOSIS — Z7951 Long term (current) use of inhaled steroids: Secondary | ICD-10-CM

## 2015-11-20 DIAGNOSIS — Z79899 Other long term (current) drug therapy: Secondary | ICD-10-CM | POA: Insufficient documentation

## 2015-11-20 DIAGNOSIS — K5120 Ulcerative (chronic) proctitis without complications: Secondary | ICD-10-CM

## 2015-11-20 DIAGNOSIS — R1030 Lower abdominal pain, unspecified: Secondary | ICD-10-CM | POA: Diagnosis not present

## 2015-11-20 DIAGNOSIS — J45909 Unspecified asthma, uncomplicated: Secondary | ICD-10-CM | POA: Insufficient documentation

## 2015-11-20 DIAGNOSIS — Z6836 Body mass index (BMI) 36.0-36.9, adult: Secondary | ICD-10-CM

## 2015-11-20 DIAGNOSIS — K921 Melena: Secondary | ICD-10-CM

## 2015-11-20 DIAGNOSIS — K219 Gastro-esophageal reflux disease without esophagitis: Secondary | ICD-10-CM | POA: Insufficient documentation

## 2015-11-20 DIAGNOSIS — F319 Bipolar disorder, unspecified: Secondary | ICD-10-CM

## 2015-11-20 DIAGNOSIS — R197 Diarrhea, unspecified: Secondary | ICD-10-CM | POA: Diagnosis not present

## 2015-11-20 DIAGNOSIS — R1032 Left lower quadrant pain: Secondary | ICD-10-CM

## 2015-11-20 DIAGNOSIS — K625 Hemorrhage of anus and rectum: Secondary | ICD-10-CM

## 2015-11-20 DIAGNOSIS — K5289 Other specified noninfective gastroenteritis and colitis: Secondary | ICD-10-CM | POA: Insufficient documentation

## 2015-11-20 DIAGNOSIS — K6289 Other specified diseases of anus and rectum: Secondary | ICD-10-CM

## 2015-11-20 DIAGNOSIS — Z87891 Personal history of nicotine dependence: Secondary | ICD-10-CM | POA: Insufficient documentation

## 2015-11-20 DIAGNOSIS — K529 Noninfective gastroenteritis and colitis, unspecified: Secondary | ICD-10-CM

## 2015-11-20 HISTORY — PX: ESOPHAGOGASTRODUODENOSCOPY (EGD) WITH PROPOFOL: SHX5813

## 2015-11-20 HISTORY — PX: ESOPHAGOGASTRODUODENOSCOPY: SHX1529

## 2015-11-20 HISTORY — PX: COLONOSCOPY WITH PROPOFOL: SHX5780

## 2015-11-20 HISTORY — PX: BIOPSY: SHX5522

## 2015-11-20 HISTORY — PX: COLONOSCOPY: SHX174

## 2015-11-20 SURGERY — COLONOSCOPY WITH PROPOFOL
Anesthesia: Monitor Anesthesia Care

## 2015-11-20 MED ORDER — FENTANYL CITRATE (PF) 100 MCG/2ML IJ SOLN
INTRAMUSCULAR | Status: AC
  Filled 2015-11-20: qty 2

## 2015-11-20 MED ORDER — MIDAZOLAM HCL 5 MG/5ML IJ SOLN
INTRAMUSCULAR | Status: DC | PRN
  Administered 2015-11-20 (×2): 1 mg via INTRAVENOUS

## 2015-11-20 MED ORDER — SUCCINYLCHOLINE CHLORIDE 20 MG/ML IJ SOLN
INTRAMUSCULAR | Status: AC
  Filled 2015-11-20: qty 2

## 2015-11-20 MED ORDER — LIDOCAINE VISCOUS 2 % MT SOLN
OROMUCOSAL | Status: AC
  Filled 2015-11-20: qty 15

## 2015-11-20 MED ORDER — LIDOCAINE VISCOUS 2 % MT SOLN
5.0000 mL | OROMUCOSAL | Status: AC
  Administered 2015-11-20 (×2): 5 mL via OROMUCOSAL

## 2015-11-20 MED ORDER — GLYCOPYRROLATE 0.2 MG/ML IJ SOLN
0.2000 mg | Freq: Once | INTRAMUSCULAR | Status: AC
  Administered 2015-11-20: 0.2 mg via INTRAVENOUS

## 2015-11-20 MED ORDER — LIDOCAINE HCL (CARDIAC) 10 MG/ML IV SOLN
INTRAVENOUS | Status: DC | PRN
  Administered 2015-11-20: 25 mg via INTRAVENOUS

## 2015-11-20 MED ORDER — PROPOFOL 10 MG/ML IV BOLUS
INTRAVENOUS | Status: DC | PRN
  Administered 2015-11-20 (×2): 10 mg via INTRAVENOUS

## 2015-11-20 MED ORDER — ONDANSETRON HCL 4 MG/2ML IJ SOLN
INTRAMUSCULAR | Status: AC
  Filled 2015-11-20: qty 2

## 2015-11-20 MED ORDER — MIDAZOLAM HCL 2 MG/2ML IJ SOLN
INTRAMUSCULAR | Status: AC
  Filled 2015-11-20: qty 2

## 2015-11-20 MED ORDER — FENTANYL CITRATE (PF) 100 MCG/2ML IJ SOLN
25.0000 ug | INTRAMUSCULAR | Status: AC
  Administered 2015-11-20 (×2): 25 ug via INTRAVENOUS

## 2015-11-20 MED ORDER — GLYCOPYRROLATE 0.2 MG/ML IJ SOLN
INTRAMUSCULAR | Status: AC
  Filled 2015-11-20: qty 1

## 2015-11-20 MED ORDER — ONDANSETRON HCL 4 MG/2ML IJ SOLN: 4.0000 mg | Freq: Once | INTRAMUSCULAR | Status: DC | PRN

## 2015-11-20 MED ORDER — PROPOFOL 500 MG/50ML IV EMUL
INTRAVENOUS | Status: DC | PRN
  Administered 2015-11-20 (×2): 75 ug/kg/min via INTRAVENOUS

## 2015-11-20 MED ORDER — LIDOCAINE HCL (PF) 1 % IJ SOLN
INTRAMUSCULAR | Status: AC
  Filled 2015-11-20: qty 5

## 2015-11-20 MED ORDER — ROCURONIUM BROMIDE 50 MG/5ML IV SOLN
INTRAVENOUS | Status: AC
  Filled 2015-11-20: qty 1

## 2015-11-20 MED ORDER — MIDAZOLAM HCL 2 MG/2ML IJ SOLN
1.0000 mg | INTRAMUSCULAR | Status: DC | PRN
Start: 2015-11-20 — End: 2015-11-20
  Administered 2015-11-20: 2 mg via INTRAVENOUS

## 2015-11-20 MED ORDER — FENTANYL CITRATE (PF) 100 MCG/2ML IJ SOLN
25.0000 ug | INTRAMUSCULAR | Status: DC | PRN
Start: 2015-11-20 — End: 2015-11-20

## 2015-11-20 MED ORDER — ONDANSETRON HCL 4 MG/2ML IJ SOLN
4.0000 mg | Freq: Once | INTRAMUSCULAR | Status: AC
  Administered 2015-11-20: 4 mg via INTRAVENOUS

## 2015-11-20 MED ORDER — FENTANYL CITRATE (PF) 100 MCG/2ML IJ SOLN
INTRAMUSCULAR | Status: DC | PRN
  Administered 2015-11-20: 25 ug via INTRAVENOUS
  Administered 2015-11-20 (×2): 12.5 ug via INTRAVENOUS

## 2015-11-20 MED ORDER — PROPOFOL 10 MG/ML IV BOLUS
INTRAVENOUS | Status: AC
  Filled 2015-11-20: qty 40

## 2015-11-20 MED ORDER — LACTATED RINGERS IV SOLN
INTRAVENOUS | Status: DC
  Administered 2015-11-20: 08:00:00 via INTRAVENOUS

## 2015-11-20 NOTE — H&P (Signed)
Primary Care Physician:  [name], MD Primary Gastroenterologist:  Dr. [name]  Pre-Procedure History & Physical: HPI:  Wayne Avila is a 25 y.o. male here for Diarrhea/BRBPR.  Past Medical History  Diagnosis Date  . Asthma   . ADHD (attention deficit hyperactivity disorder)   . Bipolar 1 disorder ([place])   . GERD (gastroesophageal reflux disease)   . Depression   . Anxiety   . Mental developmental delay 11/15/2012  . PONV (postoperative nausea and vomiting)   . Hearing loss   . HOH (hard of hearing)   . Abdominal pain, chronic, right upper quadrant   . Colitis   . Seizures [facility])     Past Surgical History  Procedure Laterality Date  . Tonsillectomy    . Adenoidectomy    . Esophagogastroduodenoscopy (egd) with propofol N/A 12/07/2012    SLF:The mucosa of the esophagus appeared normal Non-erosive gastritis (inflammation) was found in the gastric antrum; multiple biopsies The duodenal mucosa showed no abnormalities in the bulb and second portion of the duodenum  . Biopsy N/A 12/07/2012    Procedure: GASTRIC BIOPSIES;  Surgeon: [name], MD;  Location: [facility];  Service: Endoscopy;  Laterality: N/A;  . Colonoscopy with propofol N/A 08/30/2013    WUJ:WJXBJY mucosa in the terminal ileum/COLITIS/ MILD PROCTITIS. 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  Marland Kitchen Biopsy N/A 08/30/2013    Procedure: BIOPSY;  Surgeon: [name], MD;  Location: [facility];  Service: Endoscopy;  Laterality: N/A;  right colon,transverse colon, left colon, rectal biopsies    Prior to Admission medications   Medication Sig Start Date End Date Taking? Authorizing Provider  albuterol (PROAIR HFA) 108 (90 BASE) MCG/ACT inhaler Inhale 2 puffs into the lungs every 4 (four) hours as needed for wheezing. Persistent dry cough 09/21/13  Yes Alethea Johnathan Hausen, MD  atomoxetine (STRATTERA) 25 MG capsule Take 25  mg by mouth daily. Takes with the 100 mg to make 125 mg daily   Yes Historical Provider, MD  budesonide-formoterol (SYMBICORT) 160-4.5 MCG/ACT inhaler Inhale 2 puffs into the lungs 2 (two) times daily.   Yes Historical Provider, MD  divalproex (DEPAKOTE) 500 MG DR tablet Take 1 tablet (500 mg total) by mouth 2 (two) times daily. 07/03/15  Yes Penni Bombard, MD  fluticasone (FLONASE) 50 MCG/ACT nasal spray Place 1 spray into both nostrils daily. Patient taking differently: Place 1 spray into both nostrils daily as needed for allergies.  11/13/14  Yes [name], NP  HYDROcodone-acetaminophen (NORCO/VICODIN) 5-325 MG tablet Take 1 tablet by mouth every 6 (six) hours as needed for moderate pain. 09/18/15  Yes Rolland Porter, MD  loratadine (CLARITIN) 10 MG tablet Take 10 mg by mouth at bedtime.    Yes Historical Provider, MD  mesalamine (APRISO) 0.375 G 24 hr capsule Take 4 capsules (1.5 g total) by mouth daily. 11/13/14  Yes [name], NP  mesalamine (CANASA) 1000 MG suppository Place 1 suppository (1,000 mg total) rectally at bedtime. 10/15/15  Yes Orvil Feil, NP  montelukast (SINGULAIR) 10 MG tablet Take 1 tablet (10 mg total) by mouth at bedtime. 11/13/14  Yes [name], NP  polyethylene glycol-electrolytes (TRILYTE) 420 g solution Take 4,000 mLs by mouth as directed. 10/15/15  Yes [name], MD  ranitidine (ZANTAC) 150 MG tablet Take 1 tablet (150 mg total) by mouth daily. 11/13/14  Yes Hewitt Shorts  Rosana Hoes, NP  sertraline (ZOLOFT) 100 MG tablet Take 1 tablet (100 mg total) by mouth daily. 11/13/14  Yes [name], NP  STRATTERA 100 MG capsule take one capsule by mouth once daily 08/09/15  Yes Historical Provider, MD  traZODone (DESYREL) 100 MG tablet Take one tablet by mouth once daily at bedtime 08/07/15  Yes Historical Provider, MD  EPINEPHrine 0.3 mg/0.3 mL IJ SOAJ injection Inject 0.3 mg into the muscle once.    Historical Provider, MD  predniSONE (DELTASONE) 10 MG tablet Take 1 tablet (10 mg total)  by mouth daily with breakfast. Patient not taking: Reported on 11/12/2015 10/11/15   Nat Christen, MD    Allergies as of 10/29/2015 - Review Complete 10/25/2015  Allergen Reaction Noted  . Amoxicillin-pot clavulanate Nausea And Vomiting 02/28/2011  . Ibuprofen Other (See Comments) 07/03/2015  . Omeprazole Nausea And Vomiting 04/20/2013  . Pineapple Swelling 04/14/2011  . Strawberry extract Swelling 05/08/2012  . Tomato Rash 04/14/2011    Family History  Problem Relation Age of Onset  . Asthma Mother   . Ulcers Mother   . Bipolar disorder Mother   . Colon cancer Neg Hx   . Liver disease Neg Hx   . ADD / ADHD Father   . Diabetes Maternal Grandmother   . Diabetes Maternal Grandfather     Social History   Social History  . Marital Status: Single    Spouse Name: N/A  . Number of Children: 0  . Years of Education: 12th   Occupational History  .      not working   Social History Main Topics  . Smoking status: Former Smoker -- 0.00 packs/day for 2 years    Quit date: 10/06/2012  . Smokeless tobacco: Never Used  . Alcohol Use: No     Comment: denies usage  . Drug Use: No  . Sexual Activity: Yes   Other Topics Concern  . Not on file   Social History Narrative   Patient lives at home with girlfriend and family.   Caffeine Use: occasionally    Review of Systems: See HPI, otherwise negative ROS   Physical Exam: BP 132/78 mmHg  Pulse 114  Temp(Src) 98.5 F (36.9 C) (Oral)  Resp 19  Ht 5' 7"  (1.702 m)  Wt 230 lb (104.327 kg)  BMI 36.01 kg/m2  SpO2 100% General:   Alert,  pleasant and cooperative in NAD Head:  Normocephalic and atraumatic. Neck:  Supple; Lungs:  Clear throughout to auscultation.    Heart:  Regular rate and rhythm. Abdomen:  Soft, nontender and nondistended. Normal bowel sounds, without guarding, and without rebound.   Neurologic:  Alert and  oriented x4;  grossly normal neurologically.  Impression/Plan:     Diarrhea/BRBPR  PLAN: TCS/EGD  TODAY WITH BIOPSY

## 2015-11-20 NOTE — Anesthesia Postprocedure Evaluation (Signed)
Anesthesia Post Note  Patient: Wayne Avila  Procedure(s) Performed: Procedure(s) (LRB): COLONOSCOPY WITH PROPOFOL (N/A) ESOPHAGOGASTRODUODENOSCOPY (EGD) WITH PROPOFOL (N/A) BIOPSY  Patient location during evaluation: PACU Anesthesia Type: MAC Level of consciousness: awake and patient cooperative Pain management: pain level controlled Vital Signs Assessment: post-procedure vital signs reviewed and stable Respiratory status: spontaneous breathing and non-rebreather facemask Cardiovascular status: stable Anesthetic complications: no    Last Vitals:  Filed Vitals:   11/20/15 0959 11/20/15 1000  BP: 120/58 120/58  Pulse: 101 93  Temp: 36.7 C   Resp: 19 16    Last Pain:  Filed Vitals:   11/20/15 1007  PainSc: 0-No pain                 Nykiah Ma

## 2015-11-20 NOTE — Anesthesia Preprocedure Evaluation (Addendum)
Anesthesia Evaluation  Patient identified by MRN, date of birth, ID band Patient awake    Reviewed: Allergy & Precautions, NPO status , Patient's Chart, lab work & pertinent test results  History of Anesthesia Complications (+) PONV and history of anesthetic complications  Airway Mallampati: II  TM Distance: >3 FB Neck ROM: Full    Dental  (+) Teeth Intact   Pulmonary asthma , former smoker,    breath sounds clear to auscultation       Cardiovascular negative cardio ROS   Rhythm:Regular Rate:Normal     Neuro/Psych Seizures -,  PSYCHIATRIC DISORDERS (ADHD) Anxiety Depression Bipolar Disorder    GI/Hepatic PUD, GERD  ,  Endo/Other  Morbid obesity  Renal/GU      Musculoskeletal   Abdominal   Peds  Hematology   Anesthesia Other Findings   Reproductive/Obstetrics                            Anesthesia Physical Anesthesia Plan  ASA: III  Anesthesia Plan: MAC   Post-op Pain Management:    Induction: Intravenous  Airway Management Planned: Simple Face Mask  Additional Equipment:   Intra-op Plan:   Post-operative Plan:   Informed Consent: I have reviewed the patients History and Physical, chart, labs and discussed the procedure including the risks, benefits and alternatives for the proposed anesthesia with the patient or authorized representative who has indicated his/her understanding and acceptance.     Plan Discussed with:   Anesthesia Plan Comments:         Anesthesia Quick Evaluation

## 2015-11-20 NOTE — Telephone Encounter (Signed)
REVIEWED-NO ADDITIONAL RECOMMENDATIONS. 

## 2015-11-20 NOTE — Anesthesia Procedure Notes (Signed)
Procedure Name: MAC Date/Time: 11/20/2015 8:55 AM Performed by: Vista Deck Pre-anesthesia Checklist: Patient identified, Emergency Drugs available, Suction available, Timeout performed and Patient being monitored Patient Re-evaluated:Patient Re-evaluated prior to inductionOxygen Delivery Method: Non-rebreather mask

## 2015-11-20 NOTE — Progress Notes (Signed)
REVIEWED-NO ADDITIONAL RECOMMENDATIONS. 

## 2015-11-20 NOTE — Transfer of Care (Signed)
Immediate Anesthesia Transfer of Care Note  Patient: IBRAHEM VOLKMAN  Procedure(s) Performed: Procedure(s) with comments: COLONOSCOPY WITH PROPOFOL (N/A) - 1300 - moved to 8:30, pt knows to arrive at 7:00 per office ESOPHAGOGASTRODUODENOSCOPY (EGD) WITH PROPOFOL (N/A) BIOPSY - ileal, right colon biopsy, left colon, rectum  Patient Location: PACU  Anesthesia Type:MAC  Level of Consciousness: sedated and patient cooperative  Airway & Oxygen Therapy: Patient Spontanous Breathing and non-rebreather face mask  Post-op Assessment: Report given to RN, Post -op Vital signs reviewed and stable and Patient moving all extremities  Post vital signs: Reviewed and stable   Last Pain: There were no vitals filed for this visit.    Patients Stated Pain Goal: 6 (95/09/32 6712)  Complications: No apparent anesthesia complications

## 2015-11-21 ENCOUNTER — Encounter (HOSPITAL_COMMUNITY): Payer: Self-pay | Admitting: Emergency Medicine

## 2015-11-21 ENCOUNTER — Inpatient Hospital Stay (HOSPITAL_COMMUNITY)
Admission: EM | Admit: 2015-11-21 | Discharge: 2015-11-25 | DRG: 385 | Disposition: A | Discharge status: Home / Self Care | Payer: Medicaid Other | Attending: Internal Medicine | Admitting: Internal Medicine

## 2015-11-21 ENCOUNTER — Emergency Department (HOSPITAL_COMMUNITY): Payer: Medicaid Other

## 2015-11-21 DIAGNOSIS — K219 Gastro-esophageal reflux disease without esophagitis: Secondary | ICD-10-CM | POA: Diagnosis present

## 2015-11-21 DIAGNOSIS — J189 Pneumonia, unspecified organism: Secondary | ICD-10-CM

## 2015-11-21 DIAGNOSIS — Z825 Family history of asthma and other chronic lower respiratory diseases: Secondary | ICD-10-CM | POA: Diagnosis not present

## 2015-11-21 DIAGNOSIS — K51311 Ulcerative (chronic) rectosigmoiditis with rectal bleeding: Secondary | ICD-10-CM | POA: Diagnosis not present

## 2015-11-21 DIAGNOSIS — Z818 Family history of other mental and behavioral disorders: Secondary | ICD-10-CM | POA: Diagnosis not present

## 2015-11-21 DIAGNOSIS — Z7951 Long term (current) use of inhaled steroids: Secondary | ICD-10-CM | POA: Diagnosis not present

## 2015-11-21 DIAGNOSIS — K5120 Ulcerative (chronic) proctitis without complications: Principal | ICD-10-CM | POA: Diagnosis present

## 2015-11-21 DIAGNOSIS — Z6837 Body mass index (BMI) 37.0-37.9, adult: Secondary | ICD-10-CM

## 2015-11-21 DIAGNOSIS — Z87891 Personal history of nicotine dependence: Secondary | ICD-10-CM | POA: Diagnosis not present

## 2015-11-21 DIAGNOSIS — K6289 Other specified diseases of anus and rectum: Secondary | ICD-10-CM | POA: Diagnosis present

## 2015-11-21 DIAGNOSIS — F3130 Bipolar disorder, current episode depressed, mild or moderate severity, unspecified: Secondary | ICD-10-CM | POA: Diagnosis not present

## 2015-11-21 DIAGNOSIS — K529 Noninfective gastroenteritis and colitis, unspecified: Secondary | ICD-10-CM | POA: Diagnosis not present

## 2015-11-21 DIAGNOSIS — K5190 Ulcerative colitis, unspecified, without complications: Secondary | ICD-10-CM | POA: Diagnosis present

## 2015-11-21 DIAGNOSIS — Z833 Family history of diabetes mellitus: Secondary | ICD-10-CM | POA: Diagnosis not present

## 2015-11-21 DIAGNOSIS — J45909 Unspecified asthma, uncomplicated: Secondary | ICD-10-CM | POA: Diagnosis present

## 2015-11-21 DIAGNOSIS — H9190 Unspecified hearing loss, unspecified ear: Secondary | ICD-10-CM | POA: Diagnosis present

## 2015-11-21 DIAGNOSIS — D500 Iron deficiency anemia secondary to blood loss (chronic): Secondary | ICD-10-CM

## 2015-11-21 DIAGNOSIS — K625 Hemorrhage of anus and rectum: Secondary | ICD-10-CM | POA: Diagnosis present

## 2015-11-21 DIAGNOSIS — F909 Attention-deficit hyperactivity disorder, unspecified type: Secondary | ICD-10-CM | POA: Diagnosis present

## 2015-11-21 DIAGNOSIS — F319 Bipolar disorder, unspecified: Secondary | ICD-10-CM | POA: Diagnosis present

## 2015-11-21 DIAGNOSIS — J690 Pneumonitis due to inhalation of food and vomit: Secondary | ICD-10-CM

## 2015-11-21 DIAGNOSIS — R109 Unspecified abdominal pain: Secondary | ICD-10-CM | POA: Diagnosis present

## 2015-11-21 DIAGNOSIS — K51211 Ulcerative (chronic) proctitis with rectal bleeding: Secondary | ICD-10-CM

## 2015-11-21 DIAGNOSIS — K5100 Ulcerative (chronic) pancolitis without complications: Secondary | ICD-10-CM | POA: Diagnosis not present

## 2015-11-21 LAB — COMPREHENSIVE METABOLIC PANEL
ALT: 12 U/L — ABNORMAL LOW (ref 17–63)
AST: 16 U/L (ref 15–41)
Albumin: 3.6 g/dL (ref 3.5–5.0)
Alkaline Phosphatase: 46 U/L (ref 38–126)
Anion gap: 4 — ABNORMAL LOW (ref 5–15)
BUN: <5 mg/dL — ABNORMAL LOW (ref 6–20)
CO2: 25 mmol/L (ref 22–32)
Calcium: 8.7 mg/dL — ABNORMAL LOW (ref 8.9–10.3)
Chloride: 106 mmol/L (ref 101–111)
Creatinine, Ser: 0.96 mg/dL (ref 0.61–1.24)
GFR calc Af Amer: >60 mL/min (ref >60)
GFR calc non Af Amer: >60 mL/min (ref >60)
Glucose, Bld: 88 mg/dL (ref 65–99)
Potassium: 3.8 mmol/L (ref 3.5–5.1)
Sodium: 135 mmol/L (ref 135–145)
Total Bilirubin: 0.2 mg/dL — ABNORMAL LOW (ref 0.3–1.2)
Total Protein: 6.7 g/dL (ref 6.5–8.1)

## 2015-11-21 LAB — CBC WITH DIFFERENTIAL/PLATELET
Basophils Absolute: 0 10*3/uL (ref 0.0–0.1)
Basophils Relative: 0 %
Eosinophils Absolute: 0.1 10*3/uL (ref 0.0–0.7)
Eosinophils Relative: 1 %
HCT: 27.2 % — ABNORMAL LOW (ref 39.0–52.0)
Hemoglobin: 9.4 g/dL — ABNORMAL LOW (ref 13.0–17.0)
Lymphocytes Relative: 10 %
Lymphs Abs: 0.9 10*3/uL (ref 0.7–4.0)
MCH: 29.1 pg (ref 26.0–34.0)
MCHC: 34.6 g/dL (ref 30.0–36.0)
MCV: 84.2 fL (ref 78.0–100.0)
Monocytes Absolute: 1.1 10*3/uL — ABNORMAL HIGH (ref 0.1–1.0)
Monocytes Relative: 12 %
Neutro Abs: 6.8 10*3/uL (ref 1.7–7.7)
Neutrophils Relative %: 77 %
Platelets: 257 10*3/uL (ref 150–400)
RBC: 3.23 MIL/uL — ABNORMAL LOW (ref 4.22–5.81)
RDW: 12.4 % (ref 11.5–15.5)
WBC: 8.9 10*3/uL (ref 4.0–10.5)

## 2015-11-21 LAB — CBC
HCT: 30.2 % — ABNORMAL LOW (ref 39.0–52.0)
Hemoglobin: 10.3 g/dL — ABNORMAL LOW (ref 13.0–17.0)
MCH: 28.8 pg (ref 26.0–34.0)
MCHC: 34.1 g/dL (ref 30.0–36.0)
MCV: 84.4 fL (ref 78.0–100.0)
Platelets: 283 10*3/uL (ref 150–400)
RBC: 3.58 MIL/uL — ABNORMAL LOW (ref 4.22–5.81)
RDW: 12.5 % (ref 11.5–15.5)
WBC: 9.6 10*3/uL (ref 4.0–10.5)

## 2015-11-21 LAB — URINALYSIS, ROUTINE W REFLEX MICROSCOPIC
Bilirubin Urine: NEGATIVE
Glucose, UA: 100 mg/dL — AB
Hgb urine dipstick: NEGATIVE
Ketones, ur: NEGATIVE mg/dL
Leukocytes, UA: NEGATIVE
Nitrite: NEGATIVE
Protein, ur: NEGATIVE mg/dL
Specific Gravity, Urine: 1.02 (ref 1.005–1.030)
pH: 6 (ref 5.0–8.0)

## 2015-11-21 LAB — LACTIC ACID, PLASMA: Lactic Acid, Venous: 1.4 mmol/L (ref 0.5–2.0)

## 2015-11-21 MED ORDER — MORPHINE SULFATE (PF) 4 MG/ML IV SOLN
4.0000 mg | Freq: Once | INTRAVENOUS | Status: AC
  Administered 2015-11-21: 4 mg via INTRAVENOUS
  Filled 2015-11-21: qty 1

## 2015-11-21 MED ORDER — SODIUM CHLORIDE 0.9 % IV BOLUS (SEPSIS)
1000.0000 mL | Freq: Once | INTRAVENOUS | Status: AC
  Administered 2015-11-21: 1000 mL via INTRAVENOUS

## 2015-11-21 MED ORDER — IOPAMIDOL (ISOVUE-300) INJECTION 61%
100.0000 mL | Freq: Once | INTRAVENOUS | Status: AC | PRN
  Administered 2015-11-21: 100 mL via INTRAVENOUS

## 2015-11-21 MED ORDER — DEXTROSE 5 % IV SOLN
1.0000 g | Freq: Once | INTRAVENOUS | Status: AC
  Administered 2015-11-21: 1 g via INTRAVENOUS
  Filled 2015-11-21: qty 10

## 2015-11-21 MED ORDER — SODIUM CHLORIDE 0.9 % IV BOLUS (SEPSIS): 1000.0000 mL | Freq: Once | INTRAVENOUS | Status: DC

## 2015-11-21 MED ORDER — METRONIDAZOLE IN NACL 5-0.79 MG/ML-% IV SOLN: 500.0000 mg | Freq: Once | INTRAVENOUS | Status: DC

## 2015-11-21 MED ORDER — SODIUM CHLORIDE 0.9 % IV SOLN: INTRAVENOUS | Status: AC

## 2015-11-21 MED ORDER — DEXTROSE 5 % IV SOLN
500.0000 mg | Freq: Once | INTRAVENOUS | Status: AC
  Administered 2015-11-21: 500 mg via INTRAVENOUS
  Filled 2015-11-21 (×2): qty 500

## 2015-11-21 MED ORDER — CIPROFLOXACIN IN D5W 400 MG/200ML IV SOLN: 400.0000 mg | Freq: Once | INTRAVENOUS | Status: DC

## 2015-11-21 MED ORDER — ONDANSETRON HCL 4 MG/2ML IJ SOLN
4.0000 mg | Freq: Once | INTRAMUSCULAR | Status: AC
  Administered 2015-11-21: 4 mg via INTRAVENOUS
  Filled 2015-11-21: qty 2

## 2015-11-21 MED ORDER — DIATRIZOATE MEGLUMINE & SODIUM 66-10 % PO SOLN
ORAL | Status: AC
  Filled 2015-11-21: qty 30

## 2015-11-21 NOTE — Discharge Instructions (Signed)
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 I BIOPSIED YOUR STOMACH, small bowel, COLON, AND RECTUM.   TREATMENT OPTIONS: 1. CONTINUE APRISO AND ADD ENEMAS OR 2. CHANGE TO A BIOLOGIC AGENT- BENEFITS ARE BETTER DISEASE CONTROL. RISKS ARE INCREASED SUSCEPTIBILITY TO INFECTION, DEGENERATIVE BRAIN DISEASE, OR LYMPHOMA OF THE SMALL BOWEL.  AVOID TOBACCO PRODUCTS AND ASPIRIN, BC/GOODY POWDERS, IBUPROFEN/MOTRIN, OR NAPROXEN/ALEVE.  CONTINUE APRISO 4 TABLETS EVERY MORNING.   YOU MAY GO TO THE [facility] WEBSITE: [email or web address], FOR MORE INFORMATION.  Follow a HIGH FIBER DIET. AVOID ITEMS THAT CAUSE BLOATING AND GAS. SEE INFO BELOW.  YOUR BIOPSY RESULTS WILL BE AVAILABLE IN MY CHART JUN 16 AND MY OFFICE WILL CONTACT YOU IN 10-14 DAYS WITH YOUR RESULTS.   FOLLOW UP IN 3 MOS.   Colonoscopy Care After Read the instructions outlined below and refer to this sheet in the next week. These discharge instructions provide you with general information on caring for yourself after you leave the hospital. While your treatment has been planned according to the most current medical practices available, unavoidable complications occasionally occur. If you have any problems or questions after discharge, call DR. Catera Hankins, [phone].  ACTIVITY  You may resume your regular activity, but move at a slower pace for the next 24 hours.   Take frequent rest periods for the next 24 hours.   Walking will help get rid of the air and reduce the bloated feeling in your belly (abdomen).   No driving for 24 hours (because of the medicine (anesthesia) used during the test).   You may shower.   Do not sign any important legal documents or operate any machinery for 24 hours (because of the anesthesia used during the test).    NUTRITION  Drink plenty of fluids.   You may resume your normal diet as instructed by your doctor.   Begin with a light meal and progress to  your normal diet. Heavy or fried foods are harder to digest and may make you feel sick to your stomach (nauseated).   Avoid alcoholic beverages for 24 hours or as instructed.    MEDICATIONS  You may resume your normal medications.   WHAT YOU CAN EXPECT TODAY  Some feelings of bloating in the abdomen.   Passage of more gas than usual.   Spotting of blood in your stool or on the toilet paper  .  IF YOU HAD POLYPS REMOVED DURING THE COLONOSCOPY:  No aspirin products for 7 days or as instructed.   Eat a soft diet IF YOU HAVE NAUSEA, BLOATING, ABDOMINAL PAIN, OR VOMITING.    FINDING OUT THE RESULTS OF YOUR TEST Not all test results are available during your visit. DR. [name] WILL CALL YOU WITHIN 7 DAYS OF YOUR PROCEDUE WITH YOUR RESULTS. Do not assume everything is normal if you have not heard from DR. Aariah Godette IN ONE WEEK, CALL HER OFFICE AT [phone].  SEEK IMMEDIATE MEDICAL ATTENTION AND CALL THE OFFICE: [phone] IF:  You have more than a spotting of blood in your stool.   Your belly is swollen (abdominal distention).   You are nauseated or vomiting.   You have a temperature over 101F.   You have abdominal pain or discomfort that is severe or gets worse throughout the day.   Low Fiber and Residue Restricted Diet A low fiber diet restricts foods that contain carbohydrates that are not digested in the small intestine. A diet containing about 10 g of  fiber is considered low fiber. The diet needs to be individualized to suit patient tolerances and preferences and to avoid unnecessary restrictions. Generally, the foods emphasized in a low fiber diet have no skins or seeds. They may have been processed to remove bran, germ, or husks. Cooking may not necessarily eliminate the fiber. Cooking may, in fact, enable a greater quantity of fiber to be consumed in a lesser volume. Legumes and nuts are also restricted. The term low residue has also been used to describe low fiber  diets, although the two are not the same. Residue refers to any substance that adds to bowel (colonic) contents, such as sloughed cells and intestinal bacteria, in addition to fiber. Residue-containing foods, prunes and prune juice, milk, and connective tissue from meats may also need to be eliminated. It is important to eliminate these foods during sudden (acute) attacks of inflammatory bowel disease, when there is a partial obstruction due to another reason, or when minimal fecal output is desired. When these problems are gone, a more normal diet may be used.  PURPOSE  Reduce stool weight and volume.   CHOOSING FOODS Check labels, especially on foods from the starch list. Often, dietary fiber content is listed with the Nutrition Facts panel.  Breads and Starches  Allowed: White, [place], and pita breads, plain rolls, buns, or sweet rolls, doughnuts, waffles, pancakes, bagels. Plain muffins, sweet breads, biscuits, matzoth. Flour. Soda, saltine, or graham crackers. Pretzels, rusks, melba toast, zwieback. Cooked cereals: cornmeal, farina, cream cereals. Dry cereals: refined corn, wheat, rice, and oat cereals (check label). Potatoes prepared any way without skins, refined macaroni, spaghetti, noodles, refined rice.   Avoid: Bread, rolls, or crackers made with whole-wheat, multigrains, rye, bran seeds, nuts, or coconut. Corn tortillas, table-shells. Corn chips, tortilla chips. Cereals containing whole-grains, multigrains, bran, coconut, nuts, or raisins. Cooked or dry oatmeal. Coarse wheat cereals, granola. Cereals advertised as "high fiber." Potato skins. Whole-grain pasta, wild or brown rice. Popcorn.  Vegetables  Allowed:  Strained tomato and vegetable juices. Fresh: tender lettuce, cucumber, cabbage, spinach, bean sprouts. Cooked, canned: asparagus, bean sprouts, cut green or wax beans, cauliflower, pumpkin, beets, mushrooms, olives, spinach, yellow squash, tomato, tomato sauce (no seeds), zucchini  (peeled), turnips. Canned sweet potatoes. Small amounts of celery, onion, radish, and green pepper may be used. Keep servings limited to  cup.   Avoid: Fresh, cooked, or canned: artichokes, baked beans, beet greens, broccoli, Brussels sprouts, French-style green beans, corn, kale, legumes, peas, sweet potatoes. Cooked: green or red cabbage, spinach. Avoid large servings of any vegetables.  Fruit  Allowed:  All fruit juices except prune juice. Cooked or canned: apricots applesauce, cantaloupe, cherries, grapefruit, grapes, kiwi, mandarin oranges, peaches, pears, fruit cocktail, pineapple, plums, watermelon. Fresh: banana, grapes, cantaloupe, avocado, cherries, pineapple, grapefruit, kiwi, nectarines, peaches, oranges, blueberries, plums. Keep servings limited to  cup or 1 piece.   Avoid: Fresh: apple with or without skin, apricots, mango, pears, raspberries, strawberries. Prune juice, stewed or dried prunes. Dried fruits, raisins, dates. Avoid large servings of all fresh fruits.  Meat and Meat Substitutes  Allowed:  Ground or well-cooked tender beef, ham, veal, lamb, pork, or poultry. Eggs, plain cheese. Fish, oysters, shrimp, lobster, other seafood. Liver, organ meats.   Avoid: Tough, fibrous meats with gristle. Peanut butter, smooth or chunky. Cheese with seeds, nuts, or other foods not allowed. Nuts, seeds, legumes, dried peas, beans, lentils.  Milk  Allowed:  All milk products except those not allowed. Milk and milk product consumption should  be minimal when low residue is desired.   Avoid: Yogurt that contains nuts or seeds.  Soups and Combination Foods  Allowed:  Bouillon, broth, or cream soups made from allowed foods. Any strained soup. Casseroles or mixed dishes made with allowed foods.   Avoid: Soups made from vegetables that are not allowed or that contain other foods not allowed.  Desserts and Sweets  Allowed:  Plain cakes and cookies, pie made with allowed fruit, pudding,  custard, cream pie. Gelatin, fruit, ice, sherbet, frozen ice pops. Ice cream, ice milk without nuts. Plain hard candy, honey, jelly, molasses, syrup, sugar, chocolate syrup, gumdrops, marshmallows.   Avoid: Desserts, cookies, or candies that contain nuts, peanut butter, or dried fruits. Jams, preserves with seeds, marmalade.  Fats and Oils  Allowed:  Margarine, butter, cream, mayonnaise, salad oils, plain salad dressings made from allowed foods. Plain gravy, crisp bacon without rind.   Avoid: Seeds, nuts, olives. Avocados.  Beverages  Allowed:  All, except those listed to avoid.   Avoid: Fruit juices with high pulp, prune juice.  Condiments  Allowed:  Ketchup, mustard, horseradish, vinegar, cream sauce, cheese sauce, cocoa powder. Spices in moderation: allspice, basil, bay leaves, celery powder or leaves, cinnamon, cumin powder, curry powder, ginger, mace, marjoram, onion or garlic powder, oregano, paprika, parsley flakes, ground pepper, rosemary, sage, savory, tarragon, thyme, turmeric.   Avoid: Coconut, pickles.  SAMPLE MEAL PLAN The following menu is provided as a sample. Your daily menu plans will vary. Be sure to include a minimum of the following each day in order to provide essential nutrients for the adult:  Starch/Bread/Cereal Group, 6 servings.   Fruit/Vegetable Group, 5 servings.   Meat/Meat Substitute Group, 2 servings.   Milk/Milk Substitute Group, 2 servings.  A serving is equal to  cup for fruits, vegetables, and cooked cereals or 1 piece for foods such as a piece of bread, 1 orange, or 1 apple. For dry cereals and crackers, use serving sizes listed on the label. Combination foods may count as full or partial servings from various food groups. Fats, desserts, and sweets may be added to the meal plan after the requirements for essential nutrients are met. SAMPLE MENU Breakfast   cup orange juice.   1 boiled egg.   1 slice white toast.   Margarine.    cup  cornflakes.   1 cup milk.   Beverage.  Lunch   cup chicken noodle soup.   2 to 3 oz sliced roast beef.   2 slices seedless rye bread.   Mayonnaise.    cup tomato juice.   1 small banana.   Beverage.  Dinner  3 oz baked chicken.    cup scalloped potatoes.    cup cooked beets.   White dinner roll.   Margarine.    cup canned peaches.   Beverage.   High-Fiber Diet A high-fiber diet changes your normal diet to include more whole grains, legumes, fruits, and vegetables. Changes in the diet involve replacing refined carbohydrates with unrefined foods. The calorie level of the diet is essentially unchanged. The Dietary Reference Intake (recommended amount) for adult males is 38 grams per day. For adult females, it is 25 grams per day. Pregnant and lactating women should consume 28 grams of fiber per day. Fiber is the intact part of a plant that is not broken down during digestion. Functional fiber is fiber that has been isolated from the plant to provide a beneficial effect in the body. PURPOSE  Increase stool  bulk.   Ease and regulate bowel movements.   Lower cholesterol.  INDICATIONS THAT YOU NEED MORE FIBER  Constipation and hemorrhoids.   Uncomplicated diverticulosis (intestine condition) and irritable bowel syndrome.   Weight management.   As a protective measure against hardening of the arteries (atherosclerosis), diabetes, and cancer.   GUIDELINES FOR INCREASING FIBER IN THE DIET  Start adding fiber to the diet slowly. A gradual increase of about 5 more grams (2 slices of whole-wheat bread, 2 servings of most fruits or vegetables, or 1 bowl of high-fiber cereal) per day is best. Too rapid an increase in fiber may result in constipation, flatulence, and bloating.   Drink enough water and fluids to keep your urine clear or pale yellow. Water, juice, or caffeine-free drinks are recommended. Not drinking enough fluid may cause constipation.   Eat a  variety of high-fiber foods rather than one type of fiber.   Try to increase your intake of fiber through using high-fiber foods rather than fiber pills or supplements that contain small amounts of fiber.   The goal is to change the types of food eaten. Do not supplement your present diet with high-fiber foods, but replace foods in your present diet.  INCLUDE A VARIETY OF FIBER SOURCES  Replace refined and processed grains with whole grains, canned fruits with fresh fruits, and incorporate other fiber sources. White rice, white breads, and most bakery goods contain little or no fiber.   Brown whole-grain rice, buckwheat oats, and many fruits and vegetables are all good sources of fiber. These include: broccoli, Brussels sprouts, cabbage, cauliflower, beets, sweet potatoes, white potatoes (skin on), carrots, tomatoes, eggplant, squash, berries, fresh fruits, and dried fruits.   Cereals appear to be the richest source of fiber. Cereal fiber is found in whole grains and bran. Bran is the fiber-rich outer coat of cereal grain, which is largely removed in refining. In whole-grain cereals, the bran remains. In breakfast cereals, the largest amount of fiber is found in those with "bran" in their names. The fiber content is sometimes indicated on the label.   You may need to include additional fruits and vegetables each day.   In baking, for 1 cup white flour, you may use the following substitutions:   1 cup whole-wheat flour minus 2 tablespoons.   1/2 cup white flour plus 1/2 cup whole-wheat flour.   Ulcerative Colitis Ulcerative colitis is a long lasting swelling and soreness (inflammation) of the colon (large intestine). In patients with ulcerative colitis, sores (ulcers) and inflammation of the inner lining of the colon lead to illness. Ulcerative colitis can also cause problems outside the digestive tract.  Ulcerative colitis is closely related to another condition of inflammation of the  intestines called Crohn's disease. Together, they are frequently referred to as inflammatory bowel disease (IBD). Ulcerative colitis and Crohn's diseases are conditions that can last years to decades. Men and women are affected equally. They most commonly begin during adolescence and early adulthood.  SYMPTOMS  Common symptoms of ulcerative colitis include rectal bleeding and diarrhea. There is a wide range of symptoms among patients with this disease depending on how severe the disease is. Some of these symptoms are:  Abdominal pain or cramping.   Diarrhea.   Fever.   Tiredness (fatigue).   Weight loss.   Night sweats.   Rectal pain.   Feeling the immediate need to have a bowel movement (rectal urgency).   CAUSES  Ulcerative colitis is caused by increased activity of the  immune system in the intestines. The immune system is the system that protects the body against disease such as harmful bacteria, viruses, fungi, and other foreign invaders. When the immune system overacts, it causes inflammation. The cause of the increased immune system activity is not known. This over activity causes long-lasting inflammation and ulceration. This condition may be passed down from your parents (inherited). Brothers, sisters, children, and parents of patients with IBD are more likely to develop these diseases. It is not contagious. This means you cannot catch it from someone else.  TREATMENT   There is no cure for ulcerative colitis.   Complications such as massive bleeding from the colon (hemorrhage), development of a hole in the colon (perforation), or the development of precancerous or cancerous changes of the colon may require surgery.   Medications are often used to decrease inflammation and control the immune system. These include medicines related to aspirin, steroid medications, and newer and stronger medications to slow down the immune system. Some medications may be used as suppositories or  enemas. A number of other medications are used or have been studied. Your caregiver will make specific recommendations.   HOME CARE INSTRUCTIONS   There is no cure for ulcerative colitis disease. The best treatment is frequent checkups with your caregiver. Periodic reevaluation is important.   Symptoms such as diarrhea can be controlled with medications. Avoid foods that have a laxative effect such fresh fruit and vegetables and dairy products. During flare ups, you can rest your bowel by staying away from solid foods. Drink clear liquids frequently during the day. Electrolyte or rehydrating fluids are best. Your caregiver can help you with suggestions. Drink often to prevent dehydration. When diarrhea has cleared, eat smaller meals and more often. Avoid food additives and stimulants such as caffeine (coffee, tea, many sodas, or chocolate). Avoid dairy products. Enzyme supplements may help if you develop intolerance to a sugar in dairy products (lactose). Ask your caregiver or dietitian about specific dietary instructions.   If you had surgery, be sure you understand your care instructions thoroughly, including proper care of any surgical wounds.   Take any medications exactly as prescribed.   Try to maintain a positive attitude. Learn relaxation techniques such as self hypnosis, mental imaging, and muscle relaxation. If possible, avoid stresses that aggravate your condition. Exercise regularly. Follow your diet. Always get plenty of rest.

## 2015-11-21 NOTE — ED Notes (Signed)
Pt reports recently diagnosis with colitis. Pt reports had EGD and colonoscopy yesterday and was told if any abd pain to come to ER. Pt denies n/v/d,fever. nad noted.

## 2015-11-21 NOTE — ED Notes (Signed)
Patient states having pain.

## 2015-11-21 NOTE — ED Notes (Signed)
Sprite provided for PO challenge

## 2015-11-21 NOTE — ED Notes (Signed)
Dr Jeronimo Greaves paged, call back received; informed 3rd floor had no more telemetry boxes- new orders received - admit to stepdown unit. Spoke with Janett Billow, RN in ICU- she will call back for report.

## 2015-11-21 NOTE — Op Note (Signed)
[facility] [place] Patient Name: Wayne Avila Procedure Date: 11/20/2015 9:44 AM MRN: [id number] Date of Birth: August 02, 1990 Attending MD: Barney Drain , MD CSN: [id number] Age: 25 Admit Type: Outpatient Procedure:                Upper GI endoscopy Indications:              Suspected upper gastrointestinal bleeding in                            patient with chronic blood loss, Lower abdominal                            pain, Diarrhea Providers:                Barney Drain, MD, Janeece Riggers, RN, Georgeann Oppenheim,                            Technician Referring MD:             Tarry Kos. Avbuere MD Medicines:                Propofol per Anesthesia Complications:            No immediate complications. Estimated Blood Loss:     Estimated blood loss was minimal. Procedure:                Pre-Anesthesia Assessment:                           - Prior to the procedure, a History and Physical                            was performed, and patient medications and                            allergies were reviewed. The patient's tolerance of                            previous anesthesia was also reviewed. The risks                            and benefits of the procedure and the sedation                            options and risks were discussed with the patient.                            All questions were answered, and informed consent                            was obtained. Prior Anticoagulants: The patient has                            taken no previous anticoagulant or antiplatelet  agents. ASA Grade Assessment: II - A patient with                            mild systemic disease. After reviewing the risks                            and benefits, the patient was deemed in                            satisfactory condition to undergo the procedure.                           After obtaining informed consent, the endoscope was                            passed under direct  vision. Throughout the                            procedure, the patient's blood pressure, pulse, and                            oxygen saturations were monitored continuously. The                            [id number] ([id number]) scope was introduced through the                            mouth, and advanced to the second part of duodenum.                            The upper GI endoscopy was accomplished without                            difficulty. The patient tolerated the procedure                            well. Scope In: 9:46:56 AM Scope Out: 9:53:51 AM Total Procedure Duration: 0 hours 6 minutes 55 seconds  Findings:      The examined esophagus was normal.      The entire examined stomach was normal. This was biopsied with a cold       forceps for evaluation of inflammatory bowel disease.      The examined duodenum was normal. This was biopsied with a cold forceps       for evaluation of inflammatory bowel disease. Impression:               - Normal esophagus.                           - Normal stomach. Biopsied.                           - Normal examined duodenum. Biopsied. Moderate Sedation:      Per Anesthesia Care Recommendation:           - Await pathology  results.                           - High fiber diet.                           - Continue present medications.                           - Await pathology results.                           - Return to my office in 3 months.                           TREATMENT OPTIONS: 1. CONTINUE APRISO AND ADD                            ENEMAS OR 2. CHANGE TO A BIOLOGIC AGENT- BENEFITS                            ARE BETTER DISEASE CONTROL. RISKS ARE INCREASED                            SUSCEPTIBILITY TO INFECTION, DEGENERATIVE BRAIN                            DISEASE, OR LYMPHOMA OF THE SMALL BOWEL.                           AVOID TOBACCO PRODUCTS AND ASPIRIN, BC/GOODY                            POWDERS, IBUPROFEN/MOTRIN, OR  NAPROXEN/ALEVE.                           CONTINUE APRISO 4 TABLETS EVERY MORNING.                           GO TO THE [facility] WEBSITE: [email or web address],                            FOR MORE INFORMATION.                           - Patient has a contact number available for                            emergencies. The signs and symptoms of potential                            delayed complications were discussed with the                            patient. Return to normal activities tomorrow.  Written discharge instructions were provided to the                            patient. Procedure Code(s):        --- Professional ---                           [phone], Esophagogastroduodenoscopy, flexible,                            transoral; with biopsy, single or multiple Diagnosis Code(s):        --- Professional ---                           R58, Hemorrhage, not elsewhere classified                           R10.30, Lower abdominal pain, unspecified                           R19.7, Diarrhea, unspecified CPT copyright 2016 [employer]. All rights reserved. The codes documented in this report are preliminary and upon coder review may  be revised to meet current compliance requirements. Barney Drain, MD Barney Drain, MD 11/21/2015 8:47:55 PM This report has been signed electronically. Number of Addenda: 0

## 2015-11-21 NOTE — ED Provider Notes (Signed)
CSN: [id number]     Arrival date & time 11/21/15  1556 History   First MD Initiated Contact with Patient 11/21/15 1837     Chief Complaint  Patient presents with  . Abdominal Pain     (Consider location/radiation/quality/duration/timing/severity/associated sxs/prior Treatment) HPI Comments: Patient with history of what sounds to be ulcerative colitis presenting with ongoing right-sided abdominal pain that onset last night. The patient had EGD and colonoscopy yesterday and was not having any of this pain prior to that. He denies any nausea, vomiting or fever. He has chronic bloody diarrhea at baseline. He reports his diarrhea became clear after having a colonoscopy prep but is now back to being bloody. He cannot quantify how many episodes he has had. No previous abdominal surgeries. Denies any pain with urination or testicular pain. Pain is having now is different than his usual colitis type pain.  Patient is a 25 y.o. male presenting with abdominal pain. The history is provided by the patient.  Abdominal Pain Associated symptoms: diarrhea and nausea   Associated symptoms: no chest pain, no cough, no dysuria, no fatigue, no fever, no hematuria, no shortness of breath and no vomiting     Past Medical History  Diagnosis Date  . Asthma   . ADHD (attention deficit hyperactivity disorder)   . Bipolar 1 disorder ([place])   . GERD (gastroesophageal reflux disease)   . Depression   . Anxiety   . Mental developmental delay 11/15/2012  . PONV (postoperative nausea and vomiting)   . Hearing loss   . HOH (hard of hearing)   . Abdominal pain, chronic, right upper quadrant   . Colitis   . Seizures [facility])    Past Surgical History  Procedure Laterality Date  . Tonsillectomy    . Adenoidectomy    . Esophagogastroduodenoscopy (egd) with propofol N/A 12/07/2012    SLF:The mucosa of the esophagus appeared normal Non-erosive gastritis (inflammation) was found in the gastric antrum; multiple biopsies The  duodenal mucosa showed no abnormalities in the bulb and second portion of the duodenum  . Biopsy N/A 12/07/2012    Procedure: GASTRIC BIOPSIES;  Surgeon: [name], MD;  Location: [facility];  Service: Endoscopy;  Laterality: N/A;  . Colonoscopy with propofol N/A 08/30/2013    ZHY:QMVHQI mucosa in the terminal ileum/COLITIS/ MILD PROCTITIS. 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  Marland Kitchen Biopsy N/A 08/30/2013    Procedure: BIOPSY;  Surgeon: [name], MD;  Location: [facility];  Service: Endoscopy;  Laterality: N/A;  right colon,transverse colon, left colon, rectal biopsies   Family History  Problem Relation Age of Onset  . Asthma Mother   . Ulcers Mother   . Bipolar disorder Mother   . Colon cancer Neg Hx   . Liver disease Neg Hx   . ADD / ADHD Father   . Diabetes Maternal Grandmother   . Diabetes Maternal Grandfather    Social History  Substance Use Topics  . Smoking status: Former Smoker -- 0.00 packs/day for 2 years    Quit date: 10/06/2012  . Smokeless tobacco: Never Used  . Alcohol Use: No     Comment: denies usage    Review of Systems  Constitutional: Positive for activity change and appetite change. Negative for fever and fatigue.  HENT: Negative for congestion and rhinorrhea.   Respiratory: Negative for cough, chest tightness and shortness of breath.   Cardiovascular: Negative for  chest pain.  Gastrointestinal: Positive for nausea, abdominal pain, diarrhea and blood in stool. Negative for vomiting.  Genitourinary: Negative for dysuria, hematuria, flank pain and testicular pain.  Musculoskeletal: Negative for myalgias, arthralgias and neck pain.  Neurological: Negative for dizziness, weakness and headaches.  A complete 10 system review of systems was obtained and all systems are negative except as noted in the HPI and PMH.      Allergies  Amoxicillin-pot  clavulanate; Ibuprofen; Omeprazole; Pineapple; Strawberry extract; and Tomato  Home Medications   Prior to Admission medications   Medication Sig Start Date End Date Taking? Authorizing Provider  atomoxetine (STRATTERA) 25 MG capsule Take 25 mg by mouth daily. Takes with the 100 mg to make 125 mg daily   Yes Historical Provider, MD  budesonide-formoterol (SYMBICORT) 160-4.5 MCG/ACT inhaler Inhale 2 puffs into the lungs 2 (two) times daily.   Yes Historical Provider, MD  divalproex (DEPAKOTE) 500 MG DR tablet Take 1 tablet (500 mg total) by mouth 2 (two) times daily. 07/03/15  Yes Penni Bombard, MD  loratadine (CLARITIN) 10 MG tablet Take 10 mg by mouth at bedtime.    Yes Historical Provider, MD  mesalamine (APRISO) 0.375 G 24 hr capsule Take 4 capsules (1.5 g total) by mouth daily. 11/13/14  Yes [name], NP  montelukast (SINGULAIR) 10 MG tablet Take 1 tablet (10 mg total) by mouth at bedtime. 11/13/14  Yes [name], NP  ranitidine (ZANTAC) 150 MG tablet Take 1 tablet (150 mg total) by mouth daily. 11/13/14  Yes [name], NP  sertraline (ZOLOFT) 100 MG tablet Take 1 tablet (100 mg total) by mouth daily. 11/13/14  Yes [name], NP  STRATTERA 100 MG capsule take one capsule by mouth once daily 08/09/15  Yes Historical Provider, MD  traZODone (DESYREL) 100 MG tablet Take one tablet by mouth once daily at bedtime 08/07/15  Yes Historical Provider, MD  albuterol (PROAIR HFA) 108 (90 BASE) MCG/ACT inhaler Inhale 2 puffs into the lungs every 4 (four) hours as needed for wheezing. Persistent dry cough 09/21/13   Sandi Mealy, MD  albuterol (PROVENTIL) (2.5 MG/3ML) 0.083% nebulizer solution Inhale 3 mLs into the lungs every 6 (six) hours as needed. Shortness of breath 11/08/15   Historical Provider, MD  EPINEPHrine 0.3 mg/0.3 mL IJ SOAJ injection Inject 0.3 mg into the muscle once.    Historical Provider, MD  fluticasone (FLONASE) 50 MCG/ACT nasal spray Place 1 spray into both nostrils  daily. Patient taking differently: Place 1 spray into both nostrils daily as needed for allergies.  11/13/14   [name], NP  HYDROcodone-acetaminophen (NORCO/VICODIN) 5-325 MG tablet Take 1 tablet by mouth every 6 (six) hours as needed for moderate pain. 09/18/15   Rolland Porter, MD  mesalamine (CANASA) 1000 MG suppository Place 1 suppository (1,000 mg total) rectally at bedtime. 10/15/15   Orvil Feil, NP   BP 108/89 mmHg  Pulse 119  Temp(Src) 99.3 F (37.4 C) (Oral)  Resp 16  SpO2 98% Physical Exam  Constitutional: He is oriented to person, place, and time. He appears well-developed and well-nourished. No distress.  HENT:  Head: Normocephalic and atraumatic.  Mouth/Throat: Oropharynx is clear and moist. No oropharyngeal exudate.  Eyes: Conjunctivae and EOM are normal. Pupils are equal, round, and reactive to light.  Neck: Normal range of motion. Neck supple.  No meningismus.  Cardiovascular: Normal rate, normal heart sounds and intact distal pulses.   No murmur heard. Tachycardic.  Pulmonary/Chest: Effort normal and breath sounds normal. No respiratory distress.  Abdominal: Soft. There is tenderness. There is no rebound and no guarding.  Right lower quadrant and periumbilical tenderness with voluntary guarding. Abdomen soft, no rigidity  Genitourinary:  No testicular pain  Musculoskeletal: Normal range of motion. He exhibits no edema or tenderness.  Neurological: He is alert and oriented to person, place, and time. No cranial nerve deficit. He exhibits normal muscle tone. Coordination normal.  No ataxia on finger to nose bilaterally. No pronator drift. 5/5 strength throughout. CN 2-12 intact.Equal grip strength. Sensation intact.   Skin: Skin is warm.  Psychiatric: He has a normal mood and affect. His behavior is normal.  Nursing note and vitals reviewed.   ED Course  Procedures (including critical care time) Labs Review Labs Reviewed  COMPREHENSIVE METABOLIC PANEL - Abnormal;  Notable for the following:    BUN <5 (*)    Calcium 8.7 (*)    ALT 12 (*)    Total Bilirubin 0.2 (*)    Anion gap 4 (*)    All other components within normal limits  CBC - Abnormal; Notable for the following:    RBC 3.58 (*)    Hemoglobin 10.3 (*)    HCT 30.2 (*)    All other components within normal limits  URINALYSIS, ROUTINE W REFLEX MICROSCOPIC (NOT AT [facility]) - Abnormal; Notable for the following:    Glucose, UA 100 (*)    All other components within normal limits  POC OCCULT BLOOD, ED    Imaging Review Ct Abdomen Pelvis W Contrast  11/21/2015  CLINICAL DATA:  Acute onset of right lower quadrant abdominal pain. Recent colonoscopy. Initial encounter. EXAM: CT ABDOMEN AND PELVIS WITH CONTRAST TECHNIQUE: Multidetector CT imaging of the abdomen and pelvis was performed using the standard protocol following bolus administration of intravenous contrast. CONTRAST:  180m ISOVUE-300 IOPAMIDOL (ISOVUE-300) INJECTION 61% COMPARISON:  None. FINDINGS: Mild fluffy left basilar airspace opacity is compatible with pneumonia. The liver and spleen are unremarkable in appearance. The gallbladder is within normal limits. The pancreas and adrenal glands are unremarkable. The kidneys are unremarkable in appearance. There is no evidence of hydronephrosis. No renal or ureteral stones are seen. No perinephric stranding is appreciated. No free fluid is identified. The small bowel is unremarkable in appearance. The stomach is within normal limits. No acute vascular abnormalities are seen. The appendix is normal in caliber, without evidence of appendicitis. Mild wall thickening is noted along the distal sigmoid colon, which may reflect an acute infectious or inflammatory process. There is no evidence of perforation or abscess formation. The bladder is mildly distended and grossly unremarkable. A small urachal remnant is incidentally noted. The prostate remains normal in size. No inguinal lymphadenopathy is seen. No  acute osseous abnormalities are identified. IMPRESSION: 1. Mild wall thickening along the distal sigmoid colon, which may reflect an acute infectious or inflammatory process. 2. Mild left basilar pneumonia noted. Electronically Signed   By: JGarald BaldingM.D.   On: 11/21/2015 21:12   Dg Abd Acute W/chest  11/21/2015  CLINICAL DATA:  Abdominal pain after upper and lower endoscopy. EXAM: DG ABDOMEN ACUTE W/ 1V CHEST COMPARISON:  10/30/2015 chest radiograph. 09/05/2014 CT abdomen/pelvis. FINDINGS: Low lung volumes. Stable cardiomediastinal silhouette with normal heart size. No pneumothorax. No pleural effusion. No pulmonary edema. No acute consolidative airspace disease. No dilated small bowel loops or air-fluid levels. Mild stool and gas throughout the colon. No evidence of pneumatosis, pneumoperitoneum or pathologic soft tissue  calcifications. IMPRESSION: 1. Low lung volumes with no active disease in the chest. 2. Nonobstructive bowel gas pattern. No evidence of pneumoperitoneum. Electronically Signed   By: Ilona Sorrel M.D.   On: 11/21/2015 19:39   I have personally reviewed and evaluated these images and lab results as part of my medical decision-making.   EKG Interpretation None      MDM   Final diagnoses:  Proctitis  CAP (community acquired pneumonia)  Abdominal pain after undergoing EGD and colonoscopy yesterday. Right sided abdominal pain on exam with voluntary guarding.  EGD and colonoscopy results not seen in System.  Acute abdominal series is negative for pneumoperitoneum or obstruction. Hemoglobin has decreased slightly to 10.3  Results now available.  EGD was normal.  Colonoscopy showed severe ulcerative proctitis.  D/w Dr. Laural Golden.  He agrees with  Admission for antibiotics and IVF given persistent tachycardia.  Appendix normal on CT. No perforation. Does not recommend steroids at this time.  Possible PNA left base, will treat.  Remains tachycardic and somewhat ill appearing.   Admission d/w Dr. Wayne Sever, MD 11/21/15 [phone]

## 2015-11-21 NOTE — H&P (Signed)
History and Physical  Wayne Avila DHW:[id number] DOB: 16-Jul-1990 DOA: 11/21/2015   PCP: [name], MD  Referring Physician: ED/ Dr. Wyvonnia Dusky  Patient coming from: Home  Chief Complaint: abdominal pain  HPI:  Wayne Avila is a 25 y.o. male with medical history of ulcerative colitis, ADD, bipolar disorder, and asthma presented with one-day history of worsening right lower quadrant abdominal pain. The patient had EGD and colonoscopy on 11/20/2015. The EGD was normal. Colonoscopy showed severe erythema and edema with ulcers from the anal verge to 20 cm above without mucosal spurring consistent with ulcerative proctitis. The patient states that his abdominal pain began after he went home from his procedures on 11/20/2015 and worsened for the next 24 hours prompting him to come to the emergency department. His last bowel movement was in the morning of 11/21/2015 without any blood. He has some nausea without emesis. He had some subjective fevers and chills. In addition, for the past week he has been having some increased shortness of breath and coughing which has worsened since his procedure on 11/20/2015. Even prior to his prep for colonoscopy, the patient states that he has had more bowel movements and a bit more blood than usual in the past week. He denies any present chest pain, headache, neck pain, hemoptysis, dysuria, hematuria.  In the emergency department, the patient was noted to have low-grade temperature of 99.3 with tachycardia heart rate 119. Blood pressure was 124/74 with oxygen saturation 98-100% on room air. BMP and LFTs were unremarkable. WBC was 9.6 with hemoglobin 10.3. CT of the abdomen and pelvis revealed mild fluffy left basilar opacity. There was thickening of the distal sigmoid colon. The patient was given 2 L normal saline and started on azithromycin and ceftriaxone.   Assessment/Plan: Colitis/Abdominal pain -?UC flare vs new infectious process vs ischemic -GI  pathogen panel -ED spoke with Dr. Laural Golden who wanted to hold off on steroids for now -clear liquids -IV levofloxacin and flagyl -continue home dose mesalamine -lactic acid -continue IVF -consulted GI  LLL opacity -?aspiration pneumonia in setting of recent moderate sedation -levofloxacin and flagyl as discussed -stable on RA  Chronic blood loss anemia -pt has intermitten hematochezia from IBD -Hgb 12.1 on 10/30/15-->10.3 -iron/tibc, ferritin -B12 -RBC folate -trend Hgb although suspect drop with dilution from fluids  Asthma -Continue Symbicort -Albuterol when necessary shortness of breath -Presently stable on room air -Continue Singulair  Bipolar disorder -Continue Depakote and Zoloft  ADHD -continue Strattera        Past Medical History  Diagnosis Date  . Asthma   . ADHD (attention deficit hyperactivity disorder)   . Bipolar 1 disorder ([place])   . GERD (gastroesophageal reflux disease)   . Depression   . Anxiety   . Mental developmental delay 11/15/2012  . PONV (postoperative nausea and vomiting)   . Hearing loss   . HOH (hard of hearing)   . Abdominal pain, chronic, right upper quadrant   . Colitis   . Seizures [facility])    Past Surgical History  Procedure Laterality Date  . Tonsillectomy    . Adenoidectomy    . Esophagogastroduodenoscopy (egd) with propofol N/A 12/07/2012    SLF:The mucosa of the esophagus appeared normal Non-erosive gastritis (inflammation) was found in the gastric antrum; multiple biopsies The duodenal mucosa showed no abnormalities in the bulb and second portion of the duodenum  . Biopsy N/A 12/07/2012    Procedure: GASTRIC BIOPSIES;  Surgeon: [name], MD;  Location: [facility];  Service: Endoscopy;  Laterality: N/A;  . Colonoscopy with propofol N/A 08/30/2013    MWN:UUVOZD mucosa in the terminal ileum/COLITIS/ MILD PROCTITIS. Biopsies showed patchy chronic active colitis of the right colon and rectum, overall findings most consistent with  idiopathic inflammatory bowel disease.  Marland Kitchen Biopsy N/A 08/30/2013    Procedure: BIOPSY;  Surgeon: [name], MD;  Location: [facility];  Service: Endoscopy;  Laterality: N/A;  right colon,transverse colon, left colon, rectal biopsies   Social History:  reports that he quit smoking about 3 years ago. He has never used smokeless tobacco. He reports that he does not drink alcohol or use illicit drugs.   Family History  Problem Relation Age of Onset  . Asthma Mother   . Ulcers Mother   . Bipolar disorder Mother   . Colon cancer Neg Hx   . Liver disease Neg Hx   . ADD / ADHD Father   . Diabetes Maternal Grandmother   . Diabetes Maternal Grandfather      Allergies  Allergen Reactions  . Amoxicillin-Pot Clavulanate Nausea And Vomiting  . Ibuprofen Other (See Comments)    Patient has ulcerative colitis  . Omeprazole Nausea And Vomiting  . Pineapple Swelling  . Strawberry Extract Swelling  . Tomato Rash     Prior to Admission medications   Medication Sig Start Date End Date Taking? Authorizing Provider  atomoxetine (STRATTERA) 25 MG capsule Take 25 mg by mouth daily. Takes with the 100 mg to make 125 mg daily   Yes Historical Provider, MD  budesonide-formoterol (SYMBICORT) 160-4.5 MCG/ACT inhaler Inhale 2 puffs into the lungs 2 (two) times daily.   Yes Historical Provider, MD  divalproex (DEPAKOTE) 500 MG DR tablet Take 1 tablet (500 mg total) by mouth 2 (two) times daily. 07/03/15  Yes Penni Bombard, MD  loratadine (CLARITIN) 10 MG tablet Take 10 mg by mouth at bedtime.    Yes Historical Provider, MD  mesalamine (APRISO) 0.375 G 24 hr capsule Take 4 capsules (1.5 g total) by mouth daily. 11/13/14  Yes [name], NP  montelukast (SINGULAIR) 10 MG tablet Take 1 tablet (10 mg total) by mouth at bedtime. 11/13/14  Yes [name], NP  ranitidine (ZANTAC) 150 MG tablet Take 1 tablet (150 mg total) by mouth daily. 11/13/14  Yes [name], NP  sertraline (ZOLOFT) 100 MG tablet Take 1  tablet (100 mg total) by mouth daily. 11/13/14  Yes [name], NP  STRATTERA 100 MG capsule take one capsule by mouth once daily 08/09/15  Yes Historical Provider, MD  traZODone (DESYREL) 100 MG tablet Take one tablet by mouth once daily at bedtime 08/07/15  Yes Historical Provider, MD  albuterol (PROAIR HFA) 108 (90 BASE) MCG/ACT inhaler Inhale 2 puffs into the lungs every 4 (four) hours as needed for wheezing. Persistent dry cough 09/21/13   Sandi Mealy, MD  albuterol (PROVENTIL) (2.5 MG/3ML) 0.083% nebulizer solution Inhale 3 mLs into the lungs every 6 (six) hours as needed. Shortness of breath 11/08/15   Historical Provider, MD  EPINEPHrine 0.3 mg/0.3 mL IJ SOAJ injection Inject 0.3 mg into the muscle once.    Historical Provider, MD  fluticasone (FLONASE) 50 MCG/ACT nasal spray Place 1 spray into both nostrils daily. Patient taking differently: Place 1 spray into both nostrils daily as needed for allergies.  11/13/14   [name], NP  HYDROcodone-acetaminophen (NORCO/VICODIN) 5-325 MG tablet Take 1 tablet by mouth every 6 (six)  hours as needed for moderate pain. 09/18/15   Rolland Porter, MD  mesalamine (CANASA) 1000 MG suppository Place 1 suppository (1,000 mg total) rectally at bedtime. 10/15/15   Orvil Feil, NP    Review of Systems:  Constitutional:  No weight loss, night sweats,  Head&Eyes: No headache.  No vision loss.  No eye pain or scotoma ENT:  No Difficulty swallowing,Tooth/dental problems,Sore throat,  No ear ache, post nasal drip,  Cardio-vascular:  No chest pain, Orthopnea, PND, swelling in lower extremities,  dizziness, palpitations  GI:  No   nausea, vomiting, diarrhea, loss of appetite,melena, Resp:   No coughing up of blood .No wheezing.No chest wall deformity  Skin:  no rash or lesions.  GU:  no dysuria, change in color of urine, no urgency or frequency. No flank pain.  Musculoskeletal:  No joint pain or swelling. No decreased range of motion. No back pain.  Psych:    No change in mood or affect. Neurologic: No headache, no dysesthesia, no focal weakness, no vision loss. No syncope  Physical Exam: Filed Vitals:   11/21/15 1618 11/21/15 1837 11/21/15 1951 11/21/15 2000  BP: 124/74 136/82 108/89 123/79  Pulse: 109 107 119 111  Temp: 99.3 F (37.4 C)     TempSrc: Oral     Resp: 18 22 16    SpO2: 99% 100% 98% 99%   General:  A&O x 3, NAD, nontoxic, pleasant/cooperative Head/Eye: No conjunctival hemorrhage, no icterus, [place]/AT, No nystagmus ENT:  No icterus,  No thrush, good dentition, no pharyngeal exudate Neck:  No masses, no lymphadenpathy, no bruits CV:  RRR, no rub, no gallop, no S3 Lung:  Bibasilar rales, left greater than right. No wheezing.  Abdomen: soft/NT, +BS, nondistended, no peritoneal signs Ext: No cyanosis, No rashes, No petechiae, No lymphangitis, No edema Neuro: CNII-XII intact, strength 4/5 in bilateral upper and lower extremities, no dysmetria  Labs on Admission:  Basic Metabolic Panel:  Recent Labs Lab 11/21/15 1710  NA 135  K 3.8  CL 106  CO2 25  GLUCOSE 88  BUN <5*  CREATININE 0.96  CALCIUM 8.7*   Liver Function Tests:  Recent Labs Lab 11/21/15 1710  AST 16  ALT 12*  ALKPHOS 46  BILITOT 0.2*  PROT 6.7  ALBUMIN 3.6   No results for input(s): LIPASE, AMYLASE in the last 168 hours. No results for input(s): AMMONIA in the last 168 hours. CBC:  Recent Labs Lab 11/21/15 1710  WBC 9.6  HGB 10.3*  HCT 30.2*  MCV 84.4  PLT 283   Coagulation Profile: No results for input(s): INR, PROTIME in the last 168 hours. Cardiac Enzymes: No results for input(s): CKTOTAL, CKMB, CKMBINDEX, TROPONINI in the last 168 hours. BNP: Invalid input(s): POCBNP CBG: No results for input(s): GLUCAP in the last 168 hours. Urine analysis:    Component Value Date/Time   COLORURINE YELLOW 11/21/2015 1950   APPEARANCEUR CLEAR 11/21/2015 1950   LABSPEC 1.020 11/21/2015 1950   PHURINE 6.0 11/21/2015 1950   GLUCOSEU 100*  11/21/2015 1950   HGBUR NEGATIVE 11/21/2015 1950   BILIRUBINUR NEGATIVE 11/21/2015 1950   KETONESUR NEGATIVE 11/21/2015 1950   PROTEINUR NEGATIVE 11/21/2015 1950   UROBILINOGEN 0.2 09/04/2014 2355   NITRITE NEGATIVE 11/21/2015 1950   LEUKOCYTESUR NEGATIVE 11/21/2015 1950   Sepsis Labs: @LABRCNTIP (procalcitonin:4,lacticidven:4) )No results found for this or any previous visit (from the past 240 hour(s)).   Radiological Exams on Admission: Ct Abdomen Pelvis W Contrast  11/21/2015  CLINICAL DATA:  Acute onset  of right lower quadrant abdominal pain. Recent colonoscopy. Initial encounter. EXAM: CT ABDOMEN AND PELVIS WITH CONTRAST TECHNIQUE: Multidetector CT imaging of the abdomen and pelvis was performed using the standard protocol following bolus administration of intravenous contrast. CONTRAST:  134m ISOVUE-300 IOPAMIDOL (ISOVUE-300) INJECTION 61% COMPARISON:  None. FINDINGS: Mild fluffy left basilar airspace opacity is compatible with pneumonia. The liver and spleen are unremarkable in appearance. The gallbladder is within normal limits. The pancreas and adrenal glands are unremarkable. The kidneys are unremarkable in appearance. There is no evidence of hydronephrosis. No renal or ureteral stones are seen. No perinephric stranding is appreciated. No free fluid is identified. The small bowel is unremarkable in appearance. The stomach is within normal limits. No acute vascular abnormalities are seen. The appendix is normal in caliber, without evidence of appendicitis. Mild wall thickening is noted along the distal sigmoid colon, which may reflect an acute infectious or inflammatory process. There is no evidence of perforation or abscess formation. The bladder is mildly distended and grossly unremarkable. A small urachal remnant is incidentally noted. The prostate remains normal in size. No inguinal lymphadenopathy is seen. No acute osseous abnormalities are identified. IMPRESSION: 1. Mild wall thickening  along the distal sigmoid colon, which may reflect an acute infectious or inflammatory process. 2. Mild left basilar pneumonia noted. Electronically Signed   By: JGarald BaldingM.D.   On: 11/21/2015 21:12   Dg Abd Acute W/chest  11/21/2015  CLINICAL DATA:  Abdominal pain after upper and lower endoscopy. EXAM: DG ABDOMEN ACUTE W/ 1V CHEST COMPARISON:  10/30/2015 chest radiograph. 09/05/2014 CT abdomen/pelvis. FINDINGS: Low lung volumes. Stable cardiomediastinal silhouette with normal heart size. No pneumothorax. No pleural effusion. No pulmonary edema. No acute consolidative airspace disease. No dilated small bowel loops or air-fluid levels. Mild stool and gas throughout the colon. No evidence of pneumatosis, pneumoperitoneum or pathologic soft tissue calcifications. IMPRESSION: 1. Low lung volumes with no active disease in the chest. 2. Nonobstructive bowel gas pattern. No evidence of pneumoperitoneum. Electronically Signed   By: JIlona SorrelM.D.   On: 11/21/2015 19:39        Time spent:50 minutes Code Status:   FULL Family Communication:  Mother updated at bedside Disposition Plan: expect 2-3 day hospitalization Consults called: GI DVT Prophylaxis: SCDs  Melessa Cowell, DO  [facility] Pager [phone] If 7PM-7AM, please contact night-coverage [email or web address] Password [email or web address]6/14/2017, 10:22 PM

## 2015-11-21 NOTE — Op Note (Signed)
[facility] Patient Name: Wayne Avila Procedure Date: 11/20/2015 8:58 AM MRN: [id number] Date of Birth: 1991/01/06 Attending MD: Barney Drain , MD CSN: [id number] Age: 25 Admit Type: Outpatient Procedure:                Colonoscopy WITH COLD FORCEPS Bx OF THE ILEUM/RIGHT                            COLON/LEFT COLON/RECTUM Indications:              Abdominal pain in the left lower quadrant,                            Abdominal pain in the right upper quadrant, Chronic                            diarrhea, Hematochezia-LASTTCS 2015: ULCERATIVE                            COLITIS Providers:                Barney Drain, MD, Janeece Riggers, RN, Georgeann Oppenheim,                            Technician Referring MD:             Tarry Kos. Avbuere MD Medicines:                Propofol per Anesthesia Complications:            No immediate complications. Estimated Blood Loss:     Estimated blood loss was minimal. Procedure:                Pre-Anesthesia Assessment:                           - Prior to the procedure, a History and Physical                            was performed, and patient medications and                            allergies were reviewed. The patient's tolerance of                            previous anesthesia was also reviewed. The risks                            and benefits of the procedure and the sedation                            options and risks were discussed with the patient.                            All questions were answered, and informed consent  was obtained. Prior Anticoagulants: The patient has                            taken no previous anticoagulant or antiplatelet                            agents. ASA Grade Assessment: II - A patient with                            mild systemic disease. After reviewing the risks                            and benefits, the patient was deemed in                            satisfactory condition  to undergo the procedure.                           After obtaining informed consent, the colonoscope                            was passed under direct vision. Throughout the                            procedure, the patient's blood pressure, pulse, and                            oxygen saturations were monitored continuously. The                            EC38-[id number] ([id number]) scope was introduced through                            the anus and advanced to the 10 cm into the ileum.                            The terminal ileum, ileocecal valve, appendiceal                            orifice, and rectum were photographed. The                            colonoscopy was somewhat difficult due to a                            tortuous colon. Successful completion of the                            procedure was aided by using manual pressure. The                            patient tolerated the procedure fairly well. The  quality of the bowel preparation was excellent. Scope In: 9:16:02 AM Scope Out: 9:40:25 AM Scope Withdrawal Time: 0 hours 20 minutes 35 seconds  Total Procedure Duration: 0 hours 24 minutes 23 seconds  Findings:      The digital rectal exam was abnormal-SEVERE ERYTHEMA, EDEMA, AND ULCERS       FROM THE ANAL VERGE TO ~ 20 CM ABOVE THE ANAL VERGE WITHOUT MUCOSAL       SPARING      The terminal ileum appeared normal. Biopsies were taken with a cold       forceps for histology.      The colon (entire examined portion) appeared normal. This was biopsied       with a cold forceps for evaluation of inflammatory bowel disease.      A diffuse area of granular mucosa was found in the rectum. This was       biopsied with a cold forceps for evaluation of inflammatory bowel       disease. Impression:               - RECTAL BLEEDING/ABDOMINALPAIN/DIARRHEA DUE TO                            SEVERE ULCERATIVE PROCTITIS Moderate Sedation:      Per Anesthesia  Care Recommendation:           - High fiber diet.                           - Continue present medications.                           - Await pathology results.                           - Repeat colonoscopy in 10 years for surveillance.                           - Return to GI office in 3 months.                           TREATMENT OPTIONS: 1. CONTINUE APRISO AND ADD                            ENEMAS OR 2. CHANGE TO A BIOLOGIC AGENT- BENEFITS                            ARE BETTER DISEASE CONTROL. RISKS ARE INCREASED                            SUSCEPTIBILITY TO INFECTION, DEGENERATIVE BRAIN                            DISEASE, OR LYMPHOMA OF THE SMALL BOWEL.                           AVOID TOBACCO PRODUCTS AND ASPIRIN, BC/GOODY  POWDERS, IBUPROFEN/MOTRIN, OR NAPROXEN/ALEVE.                           CONTINUE APRISO 4 TABLETS EVERY MORNING.                           GO TO THE [facility] WEBSITE: [email or web address],                            FOR MORE INFORMATION.                           - Patient has a contact number available for                            emergencies. The signs and symptoms of potential                            delayed complications were discussed with the                            patient. Return to normal activities tomorrow.                            Written discharge instructions were provided to the                            patient. Procedure Code(s):        --- Professional ---                           [phone], Colonoscopy, flexible; with biopsy, single                            or multiple Diagnosis Code(s):        --- Professional ---                           K62.89, Other specified diseases of anus and rectum                           R10.32, Left lower quadrant pain                           R10.11, Right upper quadrant pain                           K52.9, Noninfective gastroenteritis and colitis,                             unspecified                           K92.1, Melena (includes Hematochezia) CPT copyright 2016 [employer]. All rights reserved. The codes documented in this report are preliminary and upon coder review may  be revised to meet current compliance requirements. Barney Drain, MD  Barney Drain, MD 11/21/2015 8:38:47 PM This report has been signed electronically. Number of Addenda: 0

## 2015-11-21 NOTE — ED Notes (Signed)
Patient tolerated PO fluids with no complaints

## 2015-11-22 ENCOUNTER — Encounter (HOSPITAL_COMMUNITY): Payer: Self-pay | Admitting: *Deleted

## 2015-11-22 DIAGNOSIS — K51311 Ulcerative (chronic) rectosigmoiditis with rectal bleeding: Secondary | ICD-10-CM

## 2015-11-22 LAB — BASIC METABOLIC PANEL
Anion gap: 8 (ref 5–15)
BUN: <5 mg/dL — ABNORMAL LOW (ref 6–20)
CO2: 24 mmol/L (ref 22–32)
Calcium: 8.4 mg/dL — ABNORMAL LOW (ref 8.9–10.3)
Chloride: 106 mmol/L (ref 101–111)
Creatinine, Ser: 0.98 mg/dL (ref 0.61–1.24)
GFR calc Af Amer: >60 mL/min (ref >60)
GFR calc non Af Amer: >60 mL/min (ref >60)
Glucose, Bld: 83 mg/dL (ref 65–99)
Potassium: 3.7 mmol/L (ref 3.5–5.1)
Sodium: 138 mmol/L (ref 135–145)

## 2015-11-22 LAB — CBC
HCT: 27.1 % — ABNORMAL LOW (ref 39.0–52.0)
Hemoglobin: 9.3 g/dL — ABNORMAL LOW (ref 13.0–17.0)
MCH: 28.8 pg (ref 26.0–34.0)
MCHC: 34.3 g/dL (ref 30.0–36.0)
MCV: 83.9 fL (ref 78.0–100.0)
Platelets: 292 10*3/uL (ref 150–400)
RBC: 3.23 MIL/uL — ABNORMAL LOW (ref 4.22–5.81)
RDW: 12.6 % (ref 11.5–15.5)
WBC: 10.9 10*3/uL — ABNORMAL HIGH (ref 4.0–10.5)

## 2015-11-22 LAB — IRON AND TIBC
Iron: 30 ug/dL — ABNORMAL LOW (ref 45–182)
Saturation Ratios: 6 % — ABNORMAL LOW (ref 17.9–39.5)
TIBC: 480 ug/dL — ABNORMAL HIGH (ref 250–450)
UIBC: 450 ug/dL

## 2015-11-22 LAB — OCCULT BLOOD, POC DEVICE: Fecal Occult Bld: NEGATIVE

## 2015-11-22 LAB — FERRITIN: Ferritin: 10 ng/mL — ABNORMAL LOW (ref 24–336)

## 2015-11-22 LAB — MRSA PCR SCREENING: MRSA by PCR: NEGATIVE

## 2015-11-22 LAB — VITAMIN B12: Vitamin B-12: 324 pg/mL (ref 180–914)

## 2015-11-22 LAB — LACTIC ACID, PLASMA: Lactic Acid, Venous: 1 mmol/L (ref 0.5–2.0)

## 2015-11-22 MED ORDER — SODIUM CHLORIDE 0.9 % IV SOLN
INTRAVENOUS | Status: DC
  Administered 2015-11-22: 01:00:00 via INTRAVENOUS
  Filled 2015-11-22 (×2): qty 1000

## 2015-11-22 MED ORDER — HYDROCODONE-ACETAMINOPHEN 5-325 MG PO TABS
1.0000 | ORAL_TABLET | Freq: Four times a day (QID) | ORAL | Status: DC | PRN
  Administered 2015-11-22: 1 via ORAL
  Filled 2015-11-22 (×2): qty 1

## 2015-11-22 MED ORDER — SERTRALINE HCL 50 MG PO TABS
100.0000 mg | ORAL_TABLET | Freq: Every day | ORAL | Status: DC
  Administered 2015-11-22 – 2015-11-25 (×4): 100 mg via ORAL
  Filled 2015-11-22 (×4): qty 2

## 2015-11-22 MED ORDER — ALBUTEROL SULFATE (2.5 MG/3ML) 0.083% IN NEBU: 3.0000 mL | INHALATION_SOLUTION | Freq: Four times a day (QID) | RESPIRATORY_TRACT | Status: DC | PRN

## 2015-11-22 MED ORDER — MORPHINE SULFATE (PF) 2 MG/ML IV SOLN
2.0000 mg | INTRAVENOUS | Status: DC | PRN
  Administered 2015-11-22 – 2015-11-23 (×7): 2 mg via INTRAVENOUS
  Filled 2015-11-22 (×7): qty 1

## 2015-11-22 MED ORDER — ATOMOXETINE HCL 25 MG PO CAPS
25.0000 mg | ORAL_CAPSULE | Freq: Every day | ORAL | Status: DC
  Administered 2015-11-23 – 2015-11-25 (×3): 25 mg via ORAL
  Filled 2015-11-22: qty 1

## 2015-11-22 MED ORDER — MONTELUKAST SODIUM 10 MG PO TABS
10.0000 mg | ORAL_TABLET | Freq: Every day | ORAL | Status: DC
  Administered 2015-11-22 – 2015-11-24 (×4): 10 mg via ORAL
  Filled 2015-11-22 (×4): qty 1

## 2015-11-22 MED ORDER — LORATADINE 10 MG PO TABS
10.0000 mg | ORAL_TABLET | Freq: Every day | ORAL | Status: DC
  Administered 2015-11-22 – 2015-11-24 (×4): 10 mg via ORAL
  Filled 2015-11-22 (×4): qty 1

## 2015-11-22 MED ORDER — ACETAMINOPHEN 650 MG RE SUPP: 650.0000 mg | Freq: Four times a day (QID) | RECTAL | Status: DC | PRN

## 2015-11-22 MED ORDER — POTASSIUM CHLORIDE IN NACL 20-0.9 MEQ/L-% IV SOLN
INTRAVENOUS | Status: DC
  Administered 2015-11-22 – 2015-11-25 (×4): via INTRAVENOUS

## 2015-11-22 MED ORDER — MESALAMINE 1.2 G PO TBEC
4.8000 g | DELAYED_RELEASE_TABLET | Freq: Every day | ORAL | Status: DC
  Administered 2015-11-23: 4.8 g via ORAL
  Filled 2015-11-22 (×4): qty 4

## 2015-11-22 MED ORDER — FAMOTIDINE 20 MG PO TABS
20.0000 mg | ORAL_TABLET | Freq: Two times a day (BID) | ORAL | Status: DC
  Administered 2015-11-22 – 2015-11-25 (×8): 20 mg via ORAL
  Filled 2015-11-22 (×8): qty 1

## 2015-11-22 MED ORDER — METRONIDAZOLE IN NACL 5-0.79 MG/ML-% IV SOLN
500.0000 mg | Freq: Three times a day (TID) | INTRAVENOUS | Status: DC
  Administered 2015-11-22 – 2015-11-25 (×11): 500 mg via INTRAVENOUS
  Filled 2015-11-22 (×12): qty 100

## 2015-11-22 MED ORDER — DIVALPROEX SODIUM 250 MG PO DR TAB
500.0000 mg | DELAYED_RELEASE_TABLET | Freq: Two times a day (BID) | ORAL | Status: DC
  Administered 2015-11-22 – 2015-11-25 (×8): 500 mg via ORAL
  Filled 2015-11-22 (×8): qty 2

## 2015-11-22 MED ORDER — MOMETASONE FURO-FORMOTEROL FUM 200-5 MCG/ACT IN AERO
2.0000 | INHALATION_SPRAY | Freq: Two times a day (BID) | RESPIRATORY_TRACT | Status: DC
  Administered 2015-11-22 – 2015-11-25 (×6): 2 via RESPIRATORY_TRACT
  Filled 2015-11-22: qty 8.8

## 2015-11-22 MED ORDER — ATOMOXETINE HCL 100 MG PO CAPS
100.0000 mg | ORAL_CAPSULE | Freq: Every day | ORAL | Status: DC
  Administered 2015-11-23 – 2015-11-25 (×3): 100 mg via ORAL
  Filled 2015-11-22: qty 1

## 2015-11-22 MED ORDER — LEVOFLOXACIN IN D5W 750 MG/150ML IV SOLN: 750.0000 mg | INTRAVENOUS | Status: DC

## 2015-11-22 MED ORDER — TRAZODONE HCL 50 MG PO TABS
100.0000 mg | ORAL_TABLET | Freq: Every day | ORAL | Status: DC
  Administered 2015-11-22 – 2015-11-24 (×3): 100 mg via ORAL
  Filled 2015-11-22 (×3): qty 2

## 2015-11-22 MED ORDER — MESALAMINE ER 250 MG PO CPCR
1500.0000 mg | ORAL_CAPSULE | Freq: Every day | ORAL | Status: DC
  Administered 2015-11-22: 1500 mg via ORAL
  Filled 2015-11-22 (×2): qty 6

## 2015-11-22 MED ORDER — MESALAMINE 4 G RE ENEM
4.0000 g | ENEMA | Freq: Every day | RECTAL | Status: DC
  Administered 2015-11-22: 4 g via RECTAL
  Filled 2015-11-22 (×4): qty 60

## 2015-11-22 MED ORDER — ACETAMINOPHEN 325 MG PO TABS
650.0000 mg | ORAL_TABLET | Freq: Four times a day (QID) | ORAL | Status: DC | PRN
  Administered 2015-11-22 – 2015-11-23 (×3): 650 mg via ORAL
  Filled 2015-11-22 (×3): qty 2

## 2015-11-22 MED ORDER — SODIUM CHLORIDE 0.9% FLUSH: 3.0000 mL | Freq: Two times a day (BID) | INTRAVENOUS | Status: DC

## 2015-11-22 NOTE — Progress Notes (Signed)
PROGRESS NOTE    Wayne Avila  RCV:[id number] DOB: [birth year] DOA: 11/21/2015 PCP: [name], MD     Brief Narrative:  25 year old man admitted on 6/14 with complaints of abdominal pain. Has a history of ulcerative colitis and is status post EGD and colonoscopy on 6/13. Colonoscopy showed severe erythema and edema with ulcers from the anal verge to 20 cm above consistent with ulcerative proctitis.   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 -To consider biologic agent as an outpatient. -Plan to use steroids only if no significant improvement.  Questionable pneumonia -Chest x-ray without infiltrates, fever and leukocytosis could easily be explained by his proctitis and ulcerative colitis. -We'll discontinue antibiotic coverage for pneumonia at this time.   DVT prophylaxis: SCDs Code Status: Full Code Family Communication: Multiple family members at bedside updated on plan of care and all questions answered Disposition Plan: Anticipate discharge home in 24-48 hours  Consultants:   Gastroenterology  Procedures:   None  Antimicrobials:   None    Subjective: Feels improved, less pain, still with mucousy diarrhea  Objective: Filed Vitals:   11/22/15 0500 11/22/15 0727 11/22/15 0905 11/22/15 1513  BP:    130/68  Pulse: 122   107  Temp:  100.6 F (38.1 C)  98.6 F (37 C)  TempSrc:  Oral  Oral  Resp: 15   20  Height:      Weight: 107.2 kg (236 lb 5.3 oz)     SpO2: 99%  99% 100%   No intake or output data in the 24 hours ending 11/22/15 1742 Filed Weights   11/22/15 0108 11/22/15 0500  Weight: 107.2 kg (236 lb 5.3 oz) 107.2 kg (236 lb 5.3 oz)     Examination:  General exam: Alert, awake, oriented x 3 Respiratory system: Clear to auscultation. Respiratory effort normal. Cardiovascular system:RRR. No murmurs, rubs, gallops. Gastrointestinal system: Abdomen is nondistended, soft and nontender. No organomegaly or masses felt. Normal bowel sounds heard. Central nervous system: Alert and oriented. No focal neurological deficits. Extremities: No C/C/E, +pedal pulses Skin: No rashes, lesions or ulcers Psychiatry: Judgement and insight appear normal. Mood & affect appropriate.     Data Reviewed: I have personally reviewed following labs and imaging studies  CBC:  Recent Labs Lab 11/21/15 1710 11/21/15 2206 11/22/15 0414  WBC 9.6 8.9 10.9*  NEUTROABS  --  6.8  --   HGB 10.3* 9.4* 9.3*  HCT 30.2* 27.2* 27.1*  MCV 84.4 84.2 83.9  PLT 283 257 102   Basic Metabolic Panel:  Recent Labs Lab 11/21/15 1710 11/22/15 0414  NA 135 138  K 3.8 3.7  CL 106 106  CO2 25 24  GLUCOSE 88 83  BUN <5* <5*  CREATININE 0.96 0.98  CALCIUM 8.7* 8.4*   GFR: Estimated Creatinine Clearance: 136.8 mL/min (by C-G formula based on Cr of 0.98). Liver Function Tests:  Recent Labs Lab 11/21/15 1710  AST 16  ALT 12*  ALKPHOS 46  BILITOT 0.2*  PROT 6.7  ALBUMIN 3.6   No results for input(s): LIPASE, AMYLASE in the last 168 hours. No results for input(s): AMMONIA in the last 168 hours. Coagulation Profile: No results for  input(s): INR, PROTIME in the last 168 hours. Cardiac Enzymes: No results for input(s): CKTOTAL, CKMB, CKMBINDEX, TROPONINI in the last 168 hours. BNP (last 3 results) No results for input(s): PROBNP in the last 8760 hours. HbA1C: No results for input(s): HGBA1C in the last 72 hours. CBG: No results for input(s): GLUCAP in the last 168 hours. Lipid Profile: No results for input(s): CHOL, HDL, LDLCALC, TRIG, CHOLHDL, LDLDIRECT in the last 72 hours. Thyroid Function Tests: No results for input(s): TSH, T4TOTAL,  FREET4, T3FREE, THYROIDAB in the last 72 hours. Anemia Panel:  Recent Labs  11/21/15 2206  VITAMINB12 324  FERRITIN 10*  TIBC 480*  IRON 30*   Urine analysis:    Component Value Date/Time   COLORURINE YELLOW 11/21/2015 1950   APPEARANCEUR CLEAR 11/21/2015 1950   LABSPEC 1.020 11/21/2015 1950   PHURINE 6.0 11/21/2015 1950   GLUCOSEU 100* 11/21/2015 1950   HGBUR NEGATIVE 11/21/2015 1950   BILIRUBINUR NEGATIVE 11/21/2015 1950   KETONESUR NEGATIVE 11/21/2015 1950   PROTEINUR NEGATIVE 11/21/2015 1950   UROBILINOGEN 0.2 09/04/2014 2355   NITRITE NEGATIVE 11/21/2015 1950   LEUKOCYTESUR NEGATIVE 11/21/2015 1950   Sepsis Labs: @LABRCNTIP (procalcitonin:4,lacticidven:4)  ) Recent Results (from the past 240 hour(s))  MRSA PCR Screening     Status: None   Collection Time: 11/22/15  1:12 AM  Result Value Ref Range Status   MRSA by PCR NEGATIVE NEGATIVE Final    Comment:        The GeneXpert MRSA Assay (FDA approved for NASAL specimens only), is one component of a comprehensive MRSA colonization surveillance program. It is not intended to diagnose MRSA infection nor to guide or monitor treatment for MRSA infections.          Radiology Studies: Ct Abdomen Pelvis W Contrast  11/21/2015  CLINICAL DATA:  Acute onset of right lower quadrant abdominal pain. Recent colonoscopy. Initial encounter. EXAM: CT ABDOMEN AND PELVIS WITH CONTRAST TECHNIQUE: Multidetector CT imaging of the abdomen and pelvis was performed using the standard protocol following bolus administration of intravenous contrast. CONTRAST:  150m ISOVUE-300 IOPAMIDOL (ISOVUE-300) INJECTION 61% COMPARISON:  None. FINDINGS: Mild fluffy left basilar airspace opacity is compatible with pneumonia. The liver and spleen are unremarkable in appearance. The gallbladder is within normal limits. The pancreas and adrenal glands are unremarkable. The kidneys are unremarkable in appearance. There is no evidence of hydronephrosis. No  renal or ureteral stones are seen. No perinephric stranding is appreciated. No free fluid is identified. The small bowel is unremarkable in appearance. The stomach is within normal limits. No acute vascular abnormalities are seen. The appendix is normal in caliber, without evidence of appendicitis. Mild wall thickening is noted along the distal sigmoid colon, which may reflect an acute infectious or inflammatory process. There is no evidence of perforation or abscess formation. The bladder is mildly distended and grossly unremarkable. A small urachal remnant is incidentally noted. The prostate remains normal in size. No inguinal lymphadenopathy is seen. No acute osseous abnormalities are identified. IMPRESSION: 1. Mild wall thickening along the distal sigmoid colon, which may reflect an acute infectious or inflammatory process. 2. Mild left basilar pneumonia noted. Electronically Signed   By: JGarald BaldingM.D.   On: 11/21/2015 21:12   Dg Abd Acute W/chest  11/21/2015  CLINICAL DATA:  Abdominal pain after upper and lower endoscopy. EXAM: DG ABDOMEN ACUTE W/ 1V CHEST COMPARISON:  10/30/2015 chest radiograph. 09/05/2014 CT abdomen/pelvis. FINDINGS: Low lung volumes. Stable cardiomediastinal silhouette with normal heart size. No pneumothorax.  No pleural effusion. No pulmonary edema. No acute consolidative airspace disease. No dilated small bowel loops or air-fluid levels. Mild stool and gas throughout the colon. No evidence of pneumatosis, pneumoperitoneum or pathologic soft tissue calcifications. IMPRESSION: 1. Low lung volumes with no active disease in the chest. 2. Nonobstructive bowel gas pattern. No evidence of pneumoperitoneum. Electronically Signed   By: Ilona Sorrel M.D.   On: 11/21/2015 19:39        Scheduled Meds: . sodium chloride   Intravenous STAT  . sodium chloride   Intravenous STAT  . atomoxetine  100 mg Oral Daily  . atomoxetine  25 mg Oral Daily  . divalproex  500 mg Oral BID  .  famotidine  20 mg Oral BID  . levofloxacin (LEVAQUIN) IV  750 mg Intravenous Q24H  . loratadine  10 mg Oral QHS  . [START ON 11/23/2015] mesalamine  4.8 g Oral Q breakfast  . mesalamine  4 g Rectal QHS  . metronidazole  500 mg Intravenous Q8H  . mometasone-formoterol  2 puff Inhalation BID  . montelukast  10 mg Oral QHS  . sertraline  100 mg Oral Daily  . sodium chloride  1,000 mL Intravenous Once  . sodium chloride flush  3 mL Intravenous Q12H  . traZODone  100 mg Oral Q breakfast   Continuous Infusions: . 0.9 % NaCl with KCl 20 mEq / L 100 mL/hr at 11/22/15 1505     LOS: 1 day    Time spent: 25 minutes. Greater than 50% of this time was spent in direct contact with the patient coordinating care.     Lelon Frohlich, MD [facility] Pager [phone]  If 7PM-7AM, please contact night-coverage [email or web address] Password [facility] 11/22/2015, 5:42 PM

## 2015-11-22 NOTE — Progress Notes (Signed)
Called report to Janan Ridge, RN on dept 300. Verbalized understanding. Pt transferred to room 310 in safe and stable condition.

## 2015-11-22 NOTE — Consult Note (Signed)
Referring Provider: Mikki Harbor* Primary Care Physician:  [name], MD Primary Gastroenterologist:  Barney Drain, MD  Reason for Consultation:  UC flare  HPI: Wayne Avila is a 25 y.o. male with likely ulcerative colitis, diagnosed March 2015, on Apriso. Colonoscopy March 2015 with findings consistent with idiopathic inflammatory bowel disease. Biopsies could not delineate between Crohn's and ulcerative colitis. [facility] lab test ordered and c/w UC. Patient is a very poor historian.  He was seen last month in the office with abdominal pain, persistent diarrhea, rectal bleeding and blood clots. He had been noncompliant with stool studies. He has been on prednisone course in March, April and prescribed on May 4. Notes short-lived improvement of symptoms on prednisone. CRP and sedimentation rate normal in March. Denies NSAIDs.   Patient presented to the emergency department with one-day history of worsening abdominal pain, left sided. EGD and colonoscopy the day prior to the presentation of the symptoms. He tells me he's been having issues with abdominal pain and diarrhea but seem to be worse after the colonoscopy. Colonoscopy showed severe erythema and edema with ulcers from the anal verge to 20 cm above without mucosal sparing. Patient has been on Apriso. In the ER he had a low-grade temperature 99.3 with tachycardia of 119 for rate. CT was done which showed mild fluffy left basilar airspace opacity compatible with pneumonia. Mild wall thickening along the distal sigmoid colon noted. Hemoglobin down to 9.3 today. Patient does note chronic rectal bleeding. C. difficile is pending.  Over 90 had a temperature 102.7. This morning down to 100.6. Remains with slight tachycardia. Blood pressures have been stable. On IV Levaquin and IV Flagyl. On Pentasa 1526m daily.   Prior to Admission medications   Medication Sig Start Date End Date Taking? Authorizing Provider  atomoxetine  (STRATTERA) 25 MG capsule Take 25 mg by mouth daily. Takes with the 100 mg to make 125 mg daily   Yes Historical Provider, MD  budesonide-formoterol (SYMBICORT) 160-4.5 MCG/ACT inhaler Inhale 2 puffs into the lungs 2 (two) times daily.   Yes Historical Provider, MD  divalproex (DEPAKOTE) 500 MG DR tablet Take 1 tablet (500 mg total) by mouth 2 (two) times daily. 07/03/15  Yes VPenni Bombard MD  loratadine (CLARITIN) 10 MG tablet Take 10 mg by mouth at bedtime.    Yes Historical Provider, MD  mesalamine (APRISO) 0.375 G 24 hr capsule Take 4 capsules (1.5 g total) by mouth daily. 11/13/14  Yes LNiel Hummer NP  montelukast (SINGULAIR) 10 MG tablet Take 1 tablet (10 mg total) by mouth at bedtime. 11/13/14  Yes LNiel Hummer NP  ranitidine (ZANTAC) 150 MG tablet Take 1 tablet (150 mg total) by mouth daily. 11/13/14  Yes LNiel Hummer NP  sertraline (ZOLOFT) 100 MG tablet Take 1 tablet (100 mg total) by mouth daily. 11/13/14  Yes LNiel Hummer NP  STRATTERA 100 MG capsule take one capsule by mouth once daily 08/09/15  Yes Historical Provider, MD  traZODone (DESYREL) 100 MG tablet Take one tablet by mouth once daily at bedtime 08/07/15  Yes Historical Provider, MD  albuterol (PROAIR HFA) 108 (90 BASE) MCG/ACT inhaler Inhale 2 puffs into the lungs every 4 (four) hours as needed for wheezing. Persistent dry cough 09/21/13   ASandi Mealy MD  albuterol (PROVENTIL) (2.5 MG/3ML) 0.083% nebulizer solution Inhale 3 mLs into the lungs every 6 (six) hours as needed. Shortness of breath 11/08/15   Historical Provider, MD  EPINEPHrine  0.3 mg/0.3 mL IJ SOAJ injection Inject 0.3 mg into the muscle once.    Historical Provider, MD  fluticasone (FLONASE) 50 MCG/ACT nasal spray Place 1 spray into both nostrils daily. Patient taking differently: Place 1 spray into both nostrils daily as needed for allergies.  11/13/14   [name], NP  HYDROcodone-acetaminophen (NORCO/VICODIN) 5-325 MG tablet Take 1 tablet by mouth every 6  (six) hours as needed for moderate pain. 09/18/15   Rolland Porter, MD  mesalamine (CANASA) 1000 MG suppository Place 1 suppository (1,000 mg total) rectally at bedtime. 10/15/15   Orvil Feil, NP    Current Facility-Administered Medications  Medication Dose Route Frequency Provider Last Rate Last Dose  . 0.9 %  sodium chloride infusion   Intravenous STAT Ezequiel Essex, MD      . 0.9 %  sodium chloride infusion   Intravenous STAT Ezequiel Essex, MD      . 0.9 % NaCl with KCl 20 mEq/ L  infusion   Intravenous Continuous Estela Leonie Green, MD 100 mL/hr at 11/22/15 0800    . acetaminophen (TYLENOL) tablet 650 mg  650 mg Oral Q6H PRN Orson Eva, MD   650 mg at 11/22/15 0139   Or  . acetaminophen (TYLENOL) suppository 650 mg  650 mg Rectal Q6H PRN Orson Eva, MD      . albuterol (PROVENTIL) (2.5 MG/3ML) 0.083% nebulizer solution 3 mL  3 mL Inhalation Q6H PRN Orson Eva, MD      . atomoxetine (STRATTERA) capsule 100 mg  100 mg Oral Daily Shanon Brow Tat, MD      . atomoxetine (STRATTERA) capsule 25 mg  25 mg Oral Daily David Tat, MD      . divalproex (DEPAKOTE) DR tablet 500 mg  500 mg Oral BID Orson Eva, MD   500 mg at 11/22/15 0139  . famotidine (PEPCID) tablet 20 mg  20 mg Oral BID Orson Eva, MD   20 mg at 11/22/15 0139  . HYDROcodone-acetaminophen (NORCO/VICODIN) 5-325 MG per tablet 1 tablet  1 tablet Oral Q6H PRN Orson Eva, MD      . levofloxacin (LEVAQUIN) IVPB 750 mg  750 mg Intravenous Q24H Orson Eva, MD      . loratadine (CLARITIN) tablet 10 mg  10 mg Oral QHS Orson Eva, MD   10 mg at 11/22/15 0139  . mesalamine (PENTASA) CR capsule 1,500 mg  1,500 mg Oral Daily Shanon Brow Tat, MD      . metroNIDAZOLE (FLAGYL) IVPB 500 mg  500 mg Intravenous Q8H Orson Eva, MD   500 mg at 11/22/15 0136  . mometasone-formoterol (DULERA) 200-5 MCG/ACT inhaler 2 puff  2 puff Inhalation BID Orson Eva, MD      . montelukast (SINGULAIR) tablet 10 mg  10 mg Oral QHS Orson Eva, MD   10 mg at 11/22/15 0139  . morphine 2 MG/ML  injection 2 mg  2 mg Intravenous Q4H PRN Orson Eva, MD   2 mg at 11/22/15 0742  . sertraline (ZOLOFT) tablet 100 mg  100 mg Oral Daily Shanon Brow Tat, MD      . sodium chloride 0.9 % bolus 1,000 mL  1,000 mL Intravenous Once Ezequiel Essex, MD      . sodium chloride flush (NS) 0.9 % injection 3 mL  3 mL Intravenous Q12H Orson Eva, MD   3 mL at 11/22/15 0100  . traZODone (DESYREL) tablet 100 mg  100 mg Oral Q breakfast Orson Eva, MD   100 mg at  11/22/15 0742    Allergies as of 11/21/2015 - Review Complete 11/21/2015  Allergen Reaction Noted  . Amoxicillin-pot clavulanate Nausea And Vomiting 02/28/2011  . Ibuprofen Other (See Comments) 07/03/2015  . Omeprazole Nausea And Vomiting 04/20/2013  . Pineapple Swelling 04/14/2011  . Strawberry extract Swelling 05/08/2012  . Tomato Rash 04/14/2011    Past Medical History  Diagnosis Date  . Asthma   . ADHD (attention deficit hyperactivity disorder)   . Bipolar 1 disorder ([place])   . GERD (gastroesophageal reflux disease)   . Depression   . Anxiety   . Mental developmental delay 11/15/2012  . PONV (postoperative nausea and vomiting)   . Hearing loss   . HOH (hard of hearing)   . Abdominal pain, chronic, right upper quadrant   . Colitis   . Seizures [facility])     Past Surgical History  Procedure Laterality Date  . Tonsillectomy    . Adenoidectomy    . Esophagogastroduodenoscopy (egd) with propofol N/A 12/07/2012    SLF:The mucosa of the esophagus appeared normal Non-erosive gastritis (inflammation) was found in the gastric antrum; multiple biopsies The duodenal mucosa showed no abnormalities in the bulb and second portion of the duodenum  . Biopsy N/A 12/07/2012    Procedure: GASTRIC BIOPSIES;  Surgeon: [name], MD;  Location: [facility];  Service: Endoscopy;  Laterality: N/A;  . Colonoscopy with propofol N/A 08/30/2013    ZDG:UYQIHK mucosa in the terminal ileum/COLITIS/ MILD PROCTITIS. Biopsies showed patchy chronic active colitis of the right colon  and rectum, overall findings most consistent with idiopathic inflammatory bowel disease.  Marland Kitchen Biopsy N/A 08/30/2013    Procedure: BIOPSY;  Surgeon: [name], MD;  Location: [facility];  Service: Endoscopy;  Laterality: N/A;  right colon,transverse colon, left colon, rectal biopsies  . Esophagogastroduodenoscopy  11/20/2015    Dr. [name]: Normal exam, stomach biopsied and duodenal biopsy.. Benign biopsies.  . Colonoscopy  11/20/2015    Dr. [name]: Severe erythema, edema, ulcers from the anal verge to 20 cm above the anal verge without mucosal sparing, remaining colon and terminal ileum appeared normal. Biopsies from the rectum revealed fulminant active chronic colitis consistent with IBD. Pathology from terminal ileum revealed intramucosal lymphoid aggregates. Remaining colon random biopsies with inactive chronic colitis    Family History  Problem Relation Age of Onset  . Asthma Mother   . Ulcers Mother   . Bipolar disorder Mother   . Colon cancer Neg Hx   . Liver disease Neg Hx   . ADD / ADHD Father   . Diabetes Maternal Grandmother   . Diabetes Maternal Grandfather     Social History   Social History  . Marital Status: Single    Spouse Name: N/A  . Number of Children: 0  . Years of Education: 12th   Occupational History  .      not working   Social History Main Topics  . Smoking status: Former Smoker -- 0.00 packs/day for 2 years    Quit date: 10/06/2012  . Smokeless tobacco: Never Used  . Alcohol Use: No     Comment: denies usage  . Drug Use: No  . Sexual Activity: Yes   Other Topics Concern  . Not on file   Social History Narrative   Patient lives at home with girlfriend and family.   Caffeine Use: occasionally     ROS:See history of present illness, otherwise patient denies  General: Negative for anorexia, weight loss, fever, chills, fatigue, weakness. Eyes:  Negative for vision changes.  ENT: Negative for hoarseness, difficulty swallowing , nasal  congestion. CV: Negative for chest pain, angina, palpitations, dyspnea on exertion, peripheral edema.  Respiratory: Negative for dyspnea at rest, dyspnea on exertion, cough, sputum, wheezing.  GI: See history of present illness. GU:  Negative for dysuria, hematuria, urinary incontinence, urinary frequency, nocturnal urination.  MS: Negative for joint pain, low back pain.  Derm: Negative for rash or itching.  Neuro: Negative for weakness, abnormal sensation, seizure, frequent headaches, memory loss, confusion.  Psych: Negative for anxiety, depression, suicidal ideation, hallucinations.  Endo: Negative for unusual weight change.  Heme: Negative for bruising or bleeding. Allergy: Negative for rash or hives.       Physical Examination: Vital signs in last 24 hours: Temp:  [98.9 F (37.2 C)-102.7 F (39.3 C)] 100.6 F (38.1 C) (06/15 0727) Pulse Rate:  [104-122] 122 (06/15 0500) Resp:  [14-22] 15 (06/15 0500) BP: (104-136)/(53-89) 108/53 mmHg (06/15 0400) SpO2:  [96 %-100 %] 99 % (06/15 0500) Weight:  [236 lb 5.3 oz (107.2 kg)] 236 lb 5.3 oz (107.2 kg) (06/15 0500) Last BM Date: 11/22/15  General: Well-nourished, well-developed in no acute distress.  Head: Normocephalic, atraumatic.   Eyes: Conjunctiva pink, no icterus. Mouth: Oropharyngeal mucosa moist and pink , no lesions erythema or exudate. Neck: Supple without thyromegaly, masses, or lymphadenopathy.  Lungs: Clear to auscultation bilaterally.  Heart: Regular rate and rhythm, no murmurs rubs or gallops.  Abdomen: Bowel sounds are normal, nontender, nondistended, no hepatosplenomegaly or masses, no abdominal bruits or    hernia , no rebound or guarding.   Rectal: Not performed Extremities: No lower extremity edema, clubbing, deformity.  Neuro: Alert and oriented x 4 , grossly normal neurologically.  Skin: Warm and dry, no rash or jaundice.   Psych: Alert and cooperative, normal mood and affect.        Intake/Output from  previous day:   Intake/Output this shift:    Lab Results: CBC  Recent Labs  11/21/15 1710 11/21/15 2206 11/22/15 0414  WBC 9.6 8.9 10.9*  HGB 10.3* 9.4* 9.3*  HCT 30.2* 27.2* 27.1*  MCV 84.4 84.2 83.9  PLT 283 257 292   BMET  Recent Labs  11/21/15 1710 11/22/15 0414  NA 135 138  K 3.8 3.7  CL 106 106  CO2 25 24  GLUCOSE 88 83  BUN <5* <5*  CREATININE 0.96 0.98  CALCIUM 8.7* 8.4*   LFT  Recent Labs  11/21/15 1710  BILITOT 0.2*  ALKPHOS 46  AST 16  ALT 12*  PROT 6.7  ALBUMIN 3.6    Lipase No results for input(s): LIPASE in the last 72 hours.  PT/INR No results for input(s): LABPROT, INR in the last 72 hours.    Imaging Studies: Dg Chest 2 View  10/30/2015  CLINICAL DATA:  Substernal chest pain. EXAM: CHEST  2 VIEW COMPARISON:  10/25/2015 FINDINGS: Persistent low lung volumes. The cardiomediastinal contours are normal. The lungs are clear. Pulmonary vasculature is normal. No consolidation, pleural effusion, or pneumothorax. No acute osseous abnormalities are seen. IMPRESSION: Hypoventilatory chest without acute process. Electronically Signed   By: Jeb Levering M.D.   On: 10/30/2015 03:43   Dg Chest 2 View  10/25/2015  CLINICAL DATA:  25 year old male with left-sided chest pain EXAM: CHEST  2 VIEW COMPARISON:  Radiograph dated 11/26/2014 FINDINGS: Two views of the chest demonstrate shallow inspiration with bibasilar atelectatic changes. There is no focal consolidation, pleural effusion, or pneumothorax. The cardiac silhouette  is within normal limits. No acute osseous pathology identified. IMPRESSION: No active cardiopulmonary disease. Electronically Signed   By: Anner Crete M.D.   On: 10/25/2015 21:24   Ct Abdomen Pelvis W Contrast  11/21/2015  CLINICAL DATA:  Acute onset of right lower quadrant abdominal pain. Recent colonoscopy. Initial encounter. EXAM: CT ABDOMEN AND PELVIS WITH CONTRAST TECHNIQUE: Multidetector CT imaging of the abdomen and  pelvis was performed using the standard protocol following bolus administration of intravenous contrast. CONTRAST:  128m ISOVUE-300 IOPAMIDOL (ISOVUE-300) INJECTION 61% COMPARISON:  None. FINDINGS: Mild fluffy left basilar airspace opacity is compatible with pneumonia. The liver and spleen are unremarkable in appearance. The gallbladder is within normal limits. The pancreas and adrenal glands are unremarkable. The kidneys are unremarkable in appearance. There is no evidence of hydronephrosis. No renal or ureteral stones are seen. No perinephric stranding is appreciated. No free fluid is identified. The small bowel is unremarkable in appearance. The stomach is within normal limits. No acute vascular abnormalities are seen. The appendix is normal in caliber, without evidence of appendicitis. Mild wall thickening is noted along the distal sigmoid colon, which may reflect an acute infectious or inflammatory process. There is no evidence of perforation or abscess formation. The bladder is mildly distended and grossly unremarkable. A small urachal remnant is incidentally noted. The prostate remains normal in size. No inguinal lymphadenopathy is seen. No acute osseous abnormalities are identified. IMPRESSION: 1. Mild wall thickening along the distal sigmoid colon, which may reflect an acute infectious or inflammatory process. 2. Mild left basilar pneumonia noted. Electronically Signed   By: JGarald BaldingM.D.   On: 11/21/2015 21:12   Dg Abd Acute W/chest  11/21/2015  CLINICAL DATA:  Abdominal pain after upper and lower endoscopy. EXAM: DG ABDOMEN ACUTE W/ 1V CHEST COMPARISON:  10/30/2015 chest radiograph. 09/05/2014 CT abdomen/pelvis. FINDINGS: Low lung volumes. Stable cardiomediastinal silhouette with normal heart size. No pneumothorax. No pleural effusion. No pulmonary edema. No acute consolidative airspace disease. No dilated small bowel loops or air-fluid levels. Mild stool and gas throughout the colon. No  evidence of pneumatosis, pneumoperitoneum or pathologic soft tissue calcifications. IMPRESSION: 1. Low lung volumes with no active disease in the chest. 2. Nonobstructive bowel gas pattern. No evidence of pneumoperitoneum. Electronically Signed   By: JIlona SorrelM.D.   On: 11/21/2015 19:39  [4 week]   Impression: 25year old gentleman with history of likely ulcerative colitis documented above with recent EGD/colonoscopy on June 13 who presented yesterday with acute on chronic abdominal pain. Recent colonoscopy demonstrated significant flare of his IBD. Patient states that his pain seemed to be worse after the procedure although has been occurring for several weeks preceding the colonoscopy. CT demonstrated no free air, abscess, other intra-abdominal issues to explain acute on chronic pain. He was noted to have pneumonia and fever. Patient clinically appears stable today.  Typically on Apriso 1.5 g daily as an outpatient. Not on formulary here. Pentasa 1.5 g daily is not equivalent dose. We'll switch to Lialda 4.8g daily. Will need change in maintenance medication in future, likely biologic for better disease control but in acute setting may need solumedrol or prednisone which felt safe from infection/PNA standpoint.   Plan: 1. Lialda 4.8 g daily while inpatient. Go home on Apriso 1.5g daily at discharge. 2. Consider biologic agent for maintenance as an outpatient. To be determined by primary gastroenterologist, Dr. FOneida Alar 3. Add Rowasa enema at bedtime for 4-6 weeks.  4. Consider steroids if no significant improvement and  deemed appropriate in setting of PNA/febrile illness.   We would like to thank you for the opportunity to participate in the care of [name].  Laureen Ochs. Bernarda Caffey [facility] [phone] 6/15/201712:19 PM     LOS: 1 day

## 2015-11-23 DIAGNOSIS — D500 Iron deficiency anemia secondary to blood loss (chronic): Secondary | ICD-10-CM

## 2015-11-23 DIAGNOSIS — K529 Noninfective gastroenteritis and colitis, unspecified: Secondary | ICD-10-CM

## 2015-11-23 DIAGNOSIS — K6289 Other specified diseases of anus and rectum: Secondary | ICD-10-CM

## 2015-11-23 DIAGNOSIS — J189 Pneumonia, unspecified organism: Secondary | ICD-10-CM | POA: Insufficient documentation

## 2015-11-23 DIAGNOSIS — K625 Hemorrhage of anus and rectum: Secondary | ICD-10-CM

## 2015-11-23 LAB — GASTROINTESTINAL PANEL BY PCR, STOOL (REPLACES STOOL CULTURE)

## 2015-11-23 LAB — BASIC METABOLIC PANEL
Anion gap: 8 (ref 5–15)
BUN: <5 mg/dL — ABNORMAL LOW (ref 6–20)
CO2: 23 mmol/L (ref 22–32)
Calcium: 8 mg/dL — ABNORMAL LOW (ref 8.9–10.3)
Chloride: 107 mmol/L (ref 101–111)
Creatinine, Ser: 0.93 mg/dL (ref 0.61–1.24)
GFR calc Af Amer: >60 mL/min (ref >60)
GFR calc non Af Amer: >60 mL/min (ref >60)
Glucose, Bld: 97 mg/dL (ref 65–99)
Potassium: 3.6 mmol/L (ref 3.5–5.1)
Sodium: 138 mmol/L (ref 135–145)

## 2015-11-23 LAB — CBC
HCT: 26 % — ABNORMAL LOW (ref 39.0–52.0)
Hemoglobin: 8.8 g/dL — ABNORMAL LOW (ref 13.0–17.0)
MCH: 28.3 pg (ref 26.0–34.0)
MCHC: 33.8 g/dL (ref 30.0–36.0)
MCV: 83.6 fL (ref 78.0–100.0)
Platelets: 250 10*3/uL (ref 150–400)
RBC: 3.11 MIL/uL — ABNORMAL LOW (ref 4.22–5.81)
RDW: 12.6 % (ref 11.5–15.5)
WBC: 9.2 10*3/uL (ref 4.0–10.5)

## 2015-11-23 LAB — C DIFFICILE QUICK SCREEN W PCR REFLEX
C Diff antigen: NEGATIVE
C Diff interpretation: NEGATIVE
C Diff toxin: NEGATIVE

## 2015-11-23 LAB — FOLATE RBC
Folate, Hemolysate: 273.6 ng/mL
Folate, RBC: 1025 ng/mL (ref >498)
Hematocrit: 26.7 % — ABNORMAL LOW (ref 37.5–51.0)

## 2015-11-23 LAB — PROCALCITONIN: Procalcitonin: 0.1 ng/mL

## 2015-11-23 MED ORDER — ENOXAPARIN SODIUM 60 MG/0.6ML ~~LOC~~ SOLN
50.0000 mg | SUBCUTANEOUS | Status: DC
  Administered 2015-11-23 – 2015-11-25 (×3): 50 mg via SUBCUTANEOUS
  Filled 2015-11-23 (×3): qty 0.6

## 2015-11-23 MED ORDER — HYDROCORTISONE 100 MG/60ML RE ENEM
100.0000 mg | ENEMA | Freq: Every day | RECTAL | Status: DC
  Administered 2015-11-23 – 2015-11-24 (×2): 100 mg via RECTAL
  Filled 2015-11-23 (×3): qty 1

## 2015-11-23 MED ORDER — DEXTROSE 5 % IV SOLN
1.0000 g | Freq: Three times a day (TID) | INTRAVENOUS | Status: DC
  Administered 2015-11-23 – 2015-11-25 (×8): 1 g via INTRAVENOUS
  Filled 2015-11-23 (×11): qty 1

## 2015-11-23 MED ORDER — ALUM & MAG HYDROXIDE-SIMETH 200-200-20 MG/5ML PO SUSP
30.0000 mL | Freq: Four times a day (QID) | ORAL | Status: DC | PRN
  Administered 2015-11-23: 30 mL via ORAL
  Filled 2015-11-23 (×2): qty 30

## 2015-11-23 NOTE — Progress Notes (Signed)
Patient complains of nausea and indigestion. Dr. Waldron Labs notified.

## 2015-11-23 NOTE — Progress Notes (Signed)
Pharmacy Antibiotic Note  Wayne Avila is a 25 y.o. male admitted on 11/21/2015 with sepsis.  Pharmacy has been consulted for CEFEPIME dosing.  Tm 101.3.  Renal fxn ok.  Plan: Cefepime 1gm IV q8h Monitor labs, renal fxn, progress, cultures Deescalate ABX when improved / appropriate  Height: 5' 7"  (170.2 cm) Weight: 236 lb 5.3 oz (107.2 kg) IBW/kg (Calculated) : 66.1  Temp (24hrs), Avg:100.3 F (37.9 C), Min:98.6 F (37 C), Max:101.3 F (38.5 C)   Recent Labs Lab 11/21/15 1710 11/21/15 2206 11/22/15 0103 11/22/15 0414 11/23/15 0747  WBC 9.6 8.9  --  10.9* 9.2  CREATININE 0.96  --   --  0.98 0.93  LATICACIDVEN  --  1.4 1.0  --   --     Estimated Creatinine Clearance: 144.2 mL/min (by C-G formula based on Cr of 0.93).    Allergies  Allergen Reactions  . Amoxicillin-Pot Clavulanate Nausea And Vomiting  . Ibuprofen Other (See Comments)    Patient has ulcerative colitis  . Omeprazole Nausea And Vomiting  . Pineapple Swelling  . Strawberry Extract Swelling  . Tomato Rash   Antimicrobials this admission: CEFEPIME 6/16  >>  FLAGYL 6/15 >>   Recent Results (from the past 240 hour(s))  MRSA PCR Screening     Status: None   Collection Time: 11/22/15  1:12 AM  Result Value Ref Range Status   MRSA by PCR NEGATIVE NEGATIVE Final    Comment:        The GeneXpert MRSA Assay (FDA approved for NASAL specimens only), is one component of a comprehensive MRSA colonization surveillance program. It is not intended to diagnose MRSA infection nor to guide or monitor treatment for MRSA infections.   C difficile quick scan w PCR reflex     Status: None   Collection Time: 11/23/15 12:52 AM  Result Value Ref Range Status   C Diff antigen NEGATIVE NEGATIVE Final   C Diff toxin NEGATIVE NEGATIVE Final   C Diff interpretation Negative for toxigenic C. difficile  Final   Thank you for allowing pharmacy to be a part of this patient's care.  Hart Robinsons A 11/23/2015 11:37 AM

## 2015-11-23 NOTE — Progress Notes (Signed)
Subjective: Sleeping when I entered the room, awoke easily to voice and touch. Feeling better today. Last bowel movement this morning which seems to be more solid, overall still runny. No brbpr noted. Abdominal pain is much improved on new medications. Denies N/V. Is wanting to advance diet from liquids.  Objective: Vital signs in last 24 hours: Temp:  [98.6 F (37 C)-101.3 F (38.5 C)] 100.9 F (38.3 C) (06/16 0357) Pulse Rate:  [107-115] 115 (06/16 0207) Resp:  [18-20] 18 (06/16 0207) BP: (123-130)/(65-68) 123/65 mmHg (06/16 0207) SpO2:  [98 %-100 %] 98 % (06/16 0726) Last BM Date: 11/22/15 General:   Alert and oriented, pleasant. Morbidly obese Head:  Normocephalic and atraumatic. Eyes:  No icterus, sclera clear. Conjuctiva pink.  Heart:  S1, S2 present, no murmurs noted.  Lungs: Clear to auscultation bilaterally, without wheezing, rales, or rhonchi.  Abdomen:  Bowel sounds present, rounded but soft, non-tender, non-distended. No HSM or hernias noted. No rebound or guarding. No masses appreciated  Msk:  Symmetrical without gross deformities. Extremities:  Without clubbing or edema. Neurologic:  Alert and  oriented x4;  grossly normal neurologically.  Psych:  Alert and cooperative. Normal mood and affect.  Intake/Output from previous day:   Intake/Output this shift:    Lab Results:  Recent Labs  11/21/15 2206 11/22/15 0414 11/23/15 0747  WBC 8.9 10.9* 9.2  HGB 9.4* 9.3* 8.8*  HCT 27.2* 27.1* 26.0*  PLT 257 292 250   BMET  Recent Labs  11/21/15 1710 11/22/15 0414 11/23/15 0747  NA 135 138 138  K 3.8 3.7 3.6  CL 106 106 107  CO2 25 24 23   GLUCOSE 88 83 97  BUN <5* <5* <5*  CREATININE 0.96 0.98 0.93  CALCIUM 8.7* 8.4* 8.0*   LFT  Recent Labs  11/21/15 1710  PROT 6.7  ALBUMIN 3.6  AST 16  ALT 12*  ALKPHOS 46  BILITOT 0.2*   PT/INR No results for input(s): LABPROT, INR in the last 72 hours. Hepatitis Panel No results for input(s): HEPBSAG,  HCVAB, HEPAIGM, HEPBIGM in the last 72 hours.   Studies/Results: Ct Abdomen Pelvis W Contrast  11/21/2015  CLINICAL DATA:  Acute onset of right lower quadrant abdominal pain. Recent colonoscopy. Initial encounter. EXAM: CT ABDOMEN AND PELVIS WITH CONTRAST TECHNIQUE: Multidetector CT imaging of the abdomen and pelvis was performed using the standard protocol following bolus administration of intravenous contrast. CONTRAST:  132m ISOVUE-300 IOPAMIDOL (ISOVUE-300) INJECTION 61% COMPARISON:  None. FINDINGS: Mild fluffy left basilar airspace opacity is compatible with pneumonia. The liver and spleen are unremarkable in appearance. The gallbladder is within normal limits. The pancreas and adrenal glands are unremarkable. The kidneys are unremarkable in appearance. There is no evidence of hydronephrosis. No renal or ureteral stones are seen. No perinephric stranding is appreciated. No free fluid is identified. The small bowel is unremarkable in appearance. The stomach is within normal limits. No acute vascular abnormalities are seen. The appendix is normal in caliber, without evidence of appendicitis. Mild wall thickening is noted along the distal sigmoid colon, which may reflect an acute infectious or inflammatory process. There is no evidence of perforation or abscess formation. The bladder is mildly distended and grossly unremarkable. A small urachal remnant is incidentally noted. The prostate remains normal in size. No inguinal lymphadenopathy is seen. No acute osseous abnormalities are identified. IMPRESSION: 1. Mild wall thickening along the distal sigmoid colon, which may reflect an acute infectious or inflammatory process. 2. Mild left basilar pneumonia  noted. Electronically Signed   By: Garald Balding M.D.   On: 11/21/2015 21:12   Dg Abd Acute W/chest  11/21/2015  CLINICAL DATA:  Abdominal pain after upper and lower endoscopy. EXAM: DG ABDOMEN ACUTE W/ 1V CHEST COMPARISON:  10/30/2015 chest radiograph.  09/05/2014 CT abdomen/pelvis. FINDINGS: Low lung volumes. Stable cardiomediastinal silhouette with normal heart size. No pneumothorax. No pleural effusion. No pulmonary edema. No acute consolidative airspace disease. No dilated small bowel loops or air-fluid levels. Mild stool and gas throughout the colon. No evidence of pneumatosis, pneumoperitoneum or pathologic soft tissue calcifications. IMPRESSION: 1. Low lung volumes with no active disease in the chest. 2. Nonobstructive bowel gas pattern. No evidence of pneumoperitoneum. Electronically Signed   By: Ilona Sorrel M.D.   On: 11/21/2015 19:39    Assessment: 25 year old gentleman with history of likely ulcerative colitis with recent EGD/colonoscopy on June 13 who presented 6/14 with acute on chronic abdominal pain. Recent colonoscopy demonstrated significant flare of his IBD. Patient states that his pain seemed to be worse after the procedure although has been occurring for several weeks preceding the colonoscopy. CT demonstrated no free air, abscess, other intra-abdominal issues to explain acute on chronic pain. He was noted to have pneumonia and fever.   Typically on Apriso 1.5 g daily as an outpatient. Not on formulary here and Pentasa 1.5 g daily is not equivalent dose. He was switched to Lialda 4.8g daily. And Rowasa enemas daily at bedtime. Plan discharge on the Rowasa enemas for a total of 4-6 weeks therapy in addition to Apriso 1.5g daily. Will need change in maintenance medication in future, likely biologic for better disease control. Depending on response while inpatient he may need solumedrol or prednisone which felt safe from infection/PNA standpoint. Of note, hospitalist has discontinued antibiotics due to chest Xray demonstrating no infiltrates.  GI path panel and CDiff are in process, Procalcitonin in process, CBC today shows slight drift down to 8.8 (from 9.3 yesterday) with normal MCV and platelets. BMP with normal kidney function, sodium,  potassium. Calcium with continued drift down to 8.0.  Seems to be clinically improving on Lialda and Rowasa. Stools solidifying, abdominal pain much improved, no noted blood. States stools are dark but not black.   Plan: 1. Continue Rowasa enemas and Lialda while inpatient 2. When ready for discharge continue Rowasa for 4-6 total weeks of topical therapy 3. Discharge home on Apriso 1.5 g daily 4. Continue to monitor for any obvious GI bleed 5. Follow H/H closely given more mild drift today and notes of dark stools 6. Advance diet to soft 7. We will see him as an outpatient to discuss other maintenance medications, possibly biologic agent 8. Advance diet to soft    Walden Field, AGNP-C Adult & Gerontological Nurse Practitioner [facility]    LOS: 2 days    11/23/2015, 8:30 AM

## 2015-11-23 NOTE — Progress Notes (Signed)
PROGRESS NOTE    Wayne Avila  LKG:[id number] DOB: September 15, 1990 DOA: 11/21/2015 PCP: [name], MD     Brief Narrative:  25 year old man admitted on 6/14 with complaints of abdominal pain. Has a history of ulcerative colitis and is status post EGD and colonoscopy on 6/13. Colonoscopy showed severe erythema and edema with ulcers from the anal verge to 20 cm above consistent with ulcerative proctitis.   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 -To consider biologic agent as an outpatient. -Plan to use steroids only if no significant improvement initially in the setting of fever and possible pneumonia.   pneumonia - CT abdomen and pelvis with evidence of left lung pneumonia , cost with GI, proctitis and likely would cause fever , will would start on cefepime and Flagyl for pneumonia coverage .  Asthma - No active wheezing, continue Symbicort , Singulair and when necessary nebs  Bipolar disorder -Continue Depakote and Zoloft  ADHD -continue Strattera  DVT prophylaxis: SCDs, [place] lovenox Code Status: Full Code Family Communication: Mother at bedside updated on plan of care and all questions answered Disposition Plan: Anticipate discharge home in 24-48 hours  Consultants:   Gastroenterology  Procedures:   None  Antimicrobials:   None    Subjective: Feels improved, less pain, still with mucousy diarrhea  Objective: Filed Vitals:   11/22/15 1513 11/23/15 0207 11/23/15 0357 11/23/15 0726  BP: 130/68 123/65    Pulse: 107 115    Temp: 98.6  F (37 C) 101.3 F (38.5 C) 100.9 F (38.3 C)   TempSrc: Oral Oral Oral   Resp: 20 18    Height:      Weight:      SpO2: 100% 98%  98%    Intake/Output Summary (Last 24 hours) at 11/23/15 1259 Last data filed at 11/23/15 1000  Gross per 24 hour  Intake   3410 ml  Output      0 ml  Net   3410 ml   Filed Weights   11/22/15 0108 11/22/15 0500  Weight: 107.2 kg (236 lb 5.3 oz) 107.2 kg (236 lb 5.3 oz)    Examination:  General exam: Alert, awake, oriented x 3 Respiratory system: Clear to auscultation. Respiratory effort normal. Cardiovascular system:RRR. No murmurs, rubs, gallops. Gastrointestinal system: Abdomen is nondistended, soft and nontender. No organomegaly or masses felt. Normal bowel sounds heard. Central nervous system: Alert and oriented. No focal neurological deficits. Extremities: No C/C/E, +pedal pulses Skin: No rashes, lesions or ulcers Psychiatry: Judgement and insight appear normal. Mood & affect appropriate.     Data Reviewed: I have personally reviewed following labs and imaging studies  CBC:  Recent Labs Lab 11/21/15 1710 11/21/15 2206 11/22/15 0414 11/23/15 0747  WBC 9.6 8.9 10.9* 9.2  NEUTROABS  --  6.8  --   --   HGB 10.3* 9.4* 9.3* 8.8*  HCT 30.2* 27.2* 27.1* 26.0*  MCV 84.4 84.2 83.9 83.6  PLT 283 257 292 664   Basic Metabolic Panel:  Recent Labs Lab 11/21/15 1710 11/22/15 0414 11/23/15 0747  NA  135 138 138  K 3.8 3.7 3.6  CL 106 106 107  CO2 25 24 23   GLUCOSE 88 83 97  BUN <5* <5* <5*  CREATININE 0.96 0.98 0.93  CALCIUM 8.7* 8.4* 8.0*   GFR: Estimated Creatinine Clearance: 144.2 mL/min (by C-G formula based on Cr of 0.93). Liver Function Tests:  Recent Labs Lab 11/21/15 1710  AST 16  ALT 12*  ALKPHOS 46  BILITOT 0.2*  PROT 6.7  ALBUMIN 3.6   No results for input(s): LIPASE, AMYLASE in the last 168 hours. No results for input(s): AMMONIA in the last 168 hours. Coagulation Profile: No results for input(s): INR,  PROTIME in the last 168 hours. Cardiac Enzymes: No results for input(s): CKTOTAL, CKMB, CKMBINDEX, TROPONINI in the last 168 hours. BNP (last 3 results) No results for input(s): PROBNP in the last 8760 hours. HbA1C: No results for input(s): HGBA1C in the last 72 hours. CBG: No results for input(s): GLUCAP in the last 168 hours. Lipid Profile: No results for input(s): CHOL, HDL, LDLCALC, TRIG, CHOLHDL, LDLDIRECT in the last 72 hours. Thyroid Function Tests: No results for input(s): TSH, T4TOTAL, FREET4, T3FREE, THYROIDAB in the last 72 hours. Anemia Panel:  Recent Labs  11/21/15 2206  VITAMINB12 324  FERRITIN 10*  TIBC 480*  IRON 30*   Urine analysis:    Component Value Date/Time   COLORURINE YELLOW 11/21/2015 1950   APPEARANCEUR CLEAR 11/21/2015 1950   LABSPEC 1.020 11/21/2015 1950   PHURINE 6.0 11/21/2015 1950   GLUCOSEU 100* 11/21/2015 1950   HGBUR NEGATIVE 11/21/2015 1950   BILIRUBINUR NEGATIVE 11/21/2015 1950   KETONESUR NEGATIVE 11/21/2015 1950   PROTEINUR NEGATIVE 11/21/2015 1950   UROBILINOGEN 0.2 09/04/2014 2355   NITRITE NEGATIVE 11/21/2015 1950   LEUKOCYTESUR NEGATIVE 11/21/2015 1950   Sepsis Labs: @LABRCNTIP (procalcitonin:4,lacticidven:4)  ) Recent Results (from the past 240 hour(s))  MRSA PCR Screening     Status: None   Collection Time: 11/22/15  1:12 AM  Result Value Ref Range Status   MRSA by PCR NEGATIVE NEGATIVE Final    Comment:        The GeneXpert MRSA Assay (FDA approved for NASAL specimens only), is one component of a comprehensive MRSA colonization surveillance program. It is not intended to diagnose MRSA infection nor to guide or monitor treatment for MRSA infections.   C difficile quick scan w PCR reflex     Status: None   Collection Time: 11/23/15 12:52 AM  Result Value Ref Range Status   C Diff antigen NEGATIVE NEGATIVE Final   C Diff toxin NEGATIVE NEGATIVE Final   C Diff interpretation Negative for toxigenic C. difficile   Final         Radiology Studies: Ct Abdomen Pelvis W Contrast  11/21/2015  CLINICAL DATA:  Acute onset of right lower quadrant abdominal pain. Recent colonoscopy. Initial encounter. EXAM: CT ABDOMEN AND PELVIS WITH CONTRAST TECHNIQUE: Multidetector CT imaging of the abdomen and pelvis was performed using the standard protocol following bolus administration of intravenous contrast. CONTRAST:  171m ISOVUE-300 IOPAMIDOL (ISOVUE-300) INJECTION 61% COMPARISON:  None. FINDINGS: Mild fluffy left basilar airspace opacity is compatible with pneumonia. The liver and spleen are unremarkable in appearance. The gallbladder is within normal limits. The pancreas and adrenal glands are unremarkable. The kidneys are unremarkable in appearance. There is no evidence of hydronephrosis. No renal or ureteral stones are seen. No perinephric stranding is appreciated. No free fluid is identified. The small bowel is unremarkable in appearance. The stomach is within  normal limits. No acute vascular abnormalities are seen. The appendix is normal in caliber, without evidence of appendicitis. Mild wall thickening is noted along the distal sigmoid colon, which may reflect an acute infectious or inflammatory process. There is no evidence of perforation or abscess formation. The bladder is mildly distended and grossly unremarkable. A small urachal remnant is incidentally noted. The prostate remains normal in size. No inguinal lymphadenopathy is seen. No acute osseous abnormalities are identified. IMPRESSION: 1. Mild wall thickening along the distal sigmoid colon, which may reflect an acute infectious or inflammatory process. 2. Mild left basilar pneumonia noted. Electronically Signed   By: Garald Balding M.D.   On: 11/21/2015 21:12   Dg Abd Acute W/chest  11/21/2015  CLINICAL DATA:  Abdominal pain after upper and lower endoscopy. EXAM: DG ABDOMEN ACUTE W/ 1V CHEST COMPARISON:  10/30/2015 chest radiograph. 09/05/2014 CT abdomen/pelvis.  FINDINGS: Low lung volumes. Stable cardiomediastinal silhouette with normal heart size. No pneumothorax. No pleural effusion. No pulmonary edema. No acute consolidative airspace disease. No dilated small bowel loops or air-fluid levels. Mild stool and gas throughout the colon. No evidence of pneumatosis, pneumoperitoneum or pathologic soft tissue calcifications. IMPRESSION: 1. Low lung volumes with no active disease in the chest. 2. Nonobstructive bowel gas pattern. No evidence of pneumoperitoneum. Electronically Signed   By: Ilona Sorrel M.D.   On: 11/21/2015 19:39        Scheduled Meds: . atomoxetine  100 mg Oral Daily  . atomoxetine  25 mg Oral Daily  . ceFEPime (MAXIPIME) IV  1 g Intravenous Q8H  . divalproex  500 mg Oral BID  . famotidine  20 mg Oral BID  . hydrocortisone  100 mg Rectal QHS  . loratadine  10 mg Oral QHS  . metronidazole  500 mg Intravenous Q8H  . mometasone-formoterol  2 puff Inhalation BID  . montelukast  10 mg Oral QHS  . sertraline  100 mg Oral Daily  . sodium chloride  1,000 mL Intravenous Once  . sodium chloride flush  3 mL Intravenous Q12H  . traZODone  100 mg Oral Q breakfast   Continuous Infusions: . 0.9 % NaCl with KCl 20 mEq / L 125 mL/hr at 11/23/15 0936     LOS: 2 days    Time spent: 25 minutes. Greater than 50% of this time was spent in direct contact with the patient coordinating care.     Phillips Climes, MD [facility] Pager [phone]  If 7PM-7AM, please contact night-coverage [email or web address] Password [facility] 11/23/2015, 12:59 PM

## 2015-11-24 LAB — CBC
HCT: 28.3 % — ABNORMAL LOW (ref 39.0–52.0)
Hemoglobin: 9.5 g/dL — ABNORMAL LOW (ref 13.0–17.0)
MCH: 28.2 pg (ref 26.0–34.0)
MCHC: 33.6 g/dL (ref 30.0–36.0)
MCV: 84 fL (ref 78.0–100.0)
Platelets: 305 10*3/uL (ref 150–400)
RBC: 3.37 MIL/uL — ABNORMAL LOW (ref 4.22–5.81)
RDW: 12.5 % (ref 11.5–15.5)
WBC: 8.3 10*3/uL (ref 4.0–10.5)

## 2015-11-24 LAB — BASIC METABOLIC PANEL
Anion gap: 7 (ref 5–15)
BUN: <5 mg/dL — ABNORMAL LOW (ref 6–20)
CO2: 24 mmol/L (ref 22–32)
Calcium: 8.4 mg/dL — ABNORMAL LOW (ref 8.9–10.3)
Chloride: 107 mmol/L (ref 101–111)
Creatinine, Ser: 0.91 mg/dL (ref 0.61–1.24)
GFR calc Af Amer: >60 mL/min (ref >60)
GFR calc non Af Amer: >60 mL/min (ref >60)
Glucose, Bld: 88 mg/dL (ref 65–99)
Potassium: 3.7 mmol/L (ref 3.5–5.1)
Sodium: 138 mmol/L (ref 135–145)

## 2015-11-24 MED ORDER — TRAZODONE HCL 50 MG PO TABS: 100.0000 mg | ORAL_TABLET | Freq: Every day | ORAL | Status: DC

## 2015-11-24 MED ORDER — MESALAMINE 1.2 G PO TBEC
4.8000 g | DELAYED_RELEASE_TABLET | Freq: Every day | ORAL | Status: DC
  Administered 2015-11-24 – 2015-11-25 (×2): 4.8 g via ORAL
  Filled 2015-11-24 (×3): qty 4

## 2015-11-24 MED ORDER — RISAQUAD PO CAPS
2.0000 | ORAL_CAPSULE | Freq: Every day | ORAL | Status: DC
  Administered 2015-11-24 – 2015-11-25 (×2): 2 via ORAL
  Filled 2015-11-24 (×2): qty 2

## 2015-11-24 NOTE — Progress Notes (Signed)
Patient reports 3 mucus laden stools this morning. No blood per rectum. No abdominal pain. Tolerating diet.  Vital signs in last 24 hours: Temp:  [98.4 F (36.9 C)-101 F (38.3 C)] 98.4 F (36.9 C) (06/17 0554) Pulse Rate:  [90-105] 90 (06/17 0554) Resp:  [20] 20 (06/17 0554) BP: (121-133)/(64-75) 121/64 mmHg (06/17 0554) SpO2:  [96 %-99 %] 99 % (06/17 0726) Last BM Date: 11/22/15 General:   Alert,  Well-developed, well-nourished, pleasant and cooperative in NAD Abdomen:  Nondistended.  Normal bowel sounds, without guarding, and without rebound.  No mass or organomegaly. Extremities:  Without clubbing or edema.    Intake/Output from previous day: 06/16 0701 - 06/17 0700 In: 1241.7 [P.O.:360; I.V.:831.7; IV Piggyback:50] Out: -  Intake/Output this shift:    Lab Results:  Recent Labs  11/22/15 0414 11/23/15 0747 11/24/15 0613  WBC 10.9* 9.2 8.3  HGB 9.3* 8.8* 9.5*  HCT 27.1*  26.7* 26.0* 28.3*  PLT 292 250 305   BMET  Recent Labs  11/22/15 0414 11/23/15 0747 11/24/15 0613  NA 138 138 138  K 3.7 3.6 3.7  CL 106 107 107  CO2 24 23 24   GLUCOSE 83 97 88  BUN <5* <5* <5*  CREATININE 0.98 0.93 0.91  CALCIUM 8.4* 8.0* 8.4*   LFT  Recent Labs  11/21/15 1710  PROT 6.7  ALBUMIN 3.6  AST 16  ALT 12*  ALKPHOS 46  BILITOT 0.2*       Impression: Left-sided proctocolitis-clinically improved  Recommendations:    Continue Apriso 1.5 g daily.  Patient should continue 30 days worth of hydrocortisone enemas-1 daily at bedtime. We will follow up on the TB and hepatitis B surface antigen screening in preparation for possible biologic therapy. Follow-up appointment will be arranged with Dr. [name] in 2 weeks.

## 2015-11-24 NOTE — Progress Notes (Signed)
PROGRESS NOTE    Wayne Avila  BBC:[id number] DOB: 1991-03-06 DOA: 11/21/2015 PCP: [name], MD     Brief Narrative:  25 year old man admitted on 6/14 with complaints of abdominal pain. Has a history of ulcerative colitis and is status post EGD and colonoscopy on 6/13. Colonoscopy showed severe erythema and edema with ulcers from the anal verge to 20 cm above consistent with ulcerative proctitis.   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 -To consider biologic agent as an outpatient. -Plan to use steroids only if no significant improvement initially in the setting of fever and possible pneumonia. - Discussed with Dr. Cherylann Parr, continue with Lialda during hospital stay, and on discharge can be resumed on his Apriso 1.5 g, and to continue with hydrocortisone enemas total of 30 days once at bedtime.   pneumonia - CT abdomen and pelvis with evidence of left lung pneumonia , patient complaining of cough, remains febrile (discussed with GI Dr. [name], proctitis unlikely would cause fever ),  started on cefepime and Flagyl for pneumonia coverage , remains febrile 101. - Patient does not look toxic, pro-calcitonin within normal level, but remains with significant fever, will continue with IV antibiotic coverage, can be transitioned to by mouth if he is afebrile for 24 hours.  Asthma - No active wheezing, continue Symbicort , Singulair and when necessary nebs  Bipolar disorder -Continue Depakote and Zoloft  ADHD -continue Strattera  DVT prophylaxis: SCDs, [place]  lovenox Code Status: Full Code Family Communication: Mother at bedside updated on plan of care and all questions answered Disposition Plan: Anticipate discharge home in 24-48 hours on by mouth antibiotics for pneumonia if he remains a febrile.  Consultants:   Gastroenterology  Procedures:   None  Antimicrobials:   None    Subjective: Feels improved, less pain, still with mucousy diarrhea  Objective: Filed Vitals:   11/23/15 2029 11/23/15 2203 11/24/15 0554 11/24/15 0726  BP:  133/75 121/64   Pulse:  105 90   Temp:  101 F (38.3 C) 98.4 F (36.9 C)   TempSrc:  Oral Oral   Resp:  20 20   Height:      Weight:      SpO2: 96% 99% 99% 99%   No intake or output data in the 24 hours ending 11/24/15 1417 Filed Weights   11/22/15 0108 11/22/15 0500  Weight: 107.2 kg (236 lb 5.3 oz) 107.2 kg (236 lb 5.3 oz)    Examination:  General exam: Alert, awake, oriented x 3 Respiratory system: Clear to auscultation. Respiratory effort normal. Cardiovascular system:RRR. No murmurs, rubs, gallops. Gastrointestinal system: Abdomen is nondistended, soft and nontender. No organomegaly or masses felt. Normal bowel sounds heard. Central nervous system: Alert and oriented. No focal neurological deficits. Extremities: No C/C/E, +pedal pulses Skin: No rashes, lesions or ulcers Psychiatry: Judgement and insight appear normal. Mood & affect appropriate.     Data Reviewed: I have personally reviewed following labs and imaging studies  CBC:  Recent Labs Lab 11/21/15 1710 11/21/15 2206 11/22/15 0414 11/23/15 0747 11/24/15 0613  WBC 9.6  8.9 10.9* 9.2 8.3  NEUTROABS  --  6.8  --   --   --   HGB 10.3* 9.4* 9.3* 8.8* 9.5*  HCT 30.2* 27.2* 27.1*  26.7* 26.0* 28.3*  MCV 84.4 84.2 83.9 83.6 84.0  PLT 283 257 292 250 299   Basic Metabolic Panel:  Recent Labs Lab 11/21/15 1710 11/22/15 0414 11/23/15 0747 11/24/15 0613  NA 135 138 138 138  K 3.8 3.7 3.6 3.7  CL 106 106 107 107   CO2 25 24 23 24   GLUCOSE 88 83 97 88  BUN <5* <5* <5* <5*  CREATININE 0.96 0.98 0.93 0.91  CALCIUM 8.7* 8.4* 8.0* 8.4*   GFR: Estimated Creatinine Clearance: 147.3 mL/min (by C-G formula based on Cr of 0.91). Liver Function Tests:  Recent Labs Lab 11/21/15 1710  AST 16  ALT 12*  ALKPHOS 46  BILITOT 0.2*  PROT 6.7  ALBUMIN 3.6   No results for input(s): LIPASE, AMYLASE in the last 168 hours. No results for input(s): AMMONIA in the last 168 hours. Coagulation Profile: No results for input(s): INR, PROTIME in the last 168 hours. Cardiac Enzymes: No results for input(s): CKTOTAL, CKMB, CKMBINDEX, TROPONINI in the last 168 hours. BNP (last 3 results) No results for input(s): PROBNP in the last 8760 hours. HbA1C: No results for input(s): HGBA1C in the last 72 hours. CBG: No results for input(s): GLUCAP in the last 168 hours. Lipid Profile: No results for input(s): CHOL, HDL, LDLCALC, TRIG, CHOLHDL, LDLDIRECT in the last 72 hours. Thyroid Function Tests: No results for input(s): TSH, T4TOTAL, FREET4, T3FREE, THYROIDAB in the last 72 hours. Anemia Panel:  Recent Labs  11/21/15 2206  VITAMINB12 324  FERRITIN 10*  TIBC 480*  IRON 30*   Urine analysis:    Component Value Date/Time   COLORURINE YELLOW 11/21/2015 1950   APPEARANCEUR CLEAR 11/21/2015 1950   LABSPEC 1.020 11/21/2015 1950   PHURINE 6.0 11/21/2015 1950   GLUCOSEU 100* 11/21/2015 1950   HGBUR NEGATIVE 11/21/2015 1950   BILIRUBINUR NEGATIVE 11/21/2015 1950   KETONESUR NEGATIVE 11/21/2015 1950   PROTEINUR NEGATIVE 11/21/2015 1950   UROBILINOGEN 0.2 09/04/2014 2355   NITRITE NEGATIVE 11/21/2015 1950   LEUKOCYTESUR NEGATIVE 11/21/2015 1950   Sepsis Labs: @LABRCNTIP (procalcitonin:4,lacticidven:4)  ) Recent Results (from the past 240 hour(s))  MRSA PCR Screening     Status: None   Collection Time: 11/22/15  1:12 AM  Result Value Ref Range Status   MRSA by PCR NEGATIVE NEGATIVE Final    Comment:         The GeneXpert MRSA Assay (FDA approved for NASAL specimens only), is one component of a comprehensive MRSA colonization surveillance program. It is not intended to diagnose MRSA infection nor to guide or monitor treatment for MRSA infections.   C difficile quick scan w PCR reflex     Status: None   Collection Time: 11/23/15 12:52 AM  Result Value Ref Range Status   C Diff antigen NEGATIVE NEGATIVE Final   C Diff toxin NEGATIVE NEGATIVE Final   C Diff interpretation Negative for toxigenic C. difficile  Final  Gastrointestinal Panel by PCR , Stool     Status: None   Collection Time: 11/23/15 12:52 AM  Result Value Ref Range Status   Campylobacter species NOT DETECTED NOT DETECTED Final   Plesimonas shigelloides NOT DETECTED NOT DETECTED Final   Salmonella species NOT DETECTED NOT DETECTED Final   Yersinia enterocolitica NOT DETECTED NOT DETECTED Final   Vibrio species NOT DETECTED  NOT DETECTED Final   Vibrio cholerae NOT DETECTED NOT DETECTED Final   Enteroaggregative E coli (EAEC) NOT DETECTED NOT DETECTED Final   Enteropathogenic E coli (EPEC) NOT DETECTED NOT DETECTED Final   Enterotoxigenic E coli (ETEC) NOT DETECTED NOT DETECTED Final   Shiga like toxin producing E coli (STEC) NOT DETECTED NOT DETECTED Final   E. coli O157 NOT DETECTED NOT DETECTED Final   Shigella/Enteroinvasive E coli (EIEC) NOT DETECTED NOT DETECTED Final   Cryptosporidium NOT DETECTED NOT DETECTED Final   Cyclospora cayetanensis NOT DETECTED NOT DETECTED Final   Entamoeba histolytica NOT DETECTED NOT DETECTED Final   Giardia lamblia NOT DETECTED NOT DETECTED Final   Adenovirus F40/41 NOT DETECTED NOT DETECTED Final   Astrovirus NOT DETECTED NOT DETECTED Final   Norovirus GI/GII NOT DETECTED NOT DETECTED Final   Rotavirus A NOT DETECTED NOT DETECTED Final   Sapovirus (I, II, IV, and V) NOT DETECTED NOT DETECTED Final         Radiology Studies: No results found.      Scheduled Meds: .  acidophilus  2 capsule Oral Daily  . atomoxetine  100 mg Oral Daily  . atomoxetine  25 mg Oral Daily  . ceFEPime (MAXIPIME) IV  1 g Intravenous Q8H  . divalproex  500 mg Oral BID  . enoxaparin (LOVENOX) injection  50 mg Subcutaneous Q24H  . famotidine  20 mg Oral BID  . hydrocortisone  100 mg Rectal QHS  . loratadine  10 mg Oral QHS  . metronidazole  500 mg Intravenous Q8H  . mometasone-formoterol  2 puff Inhalation BID  . montelukast  10 mg Oral QHS  . sertraline  100 mg Oral Daily  . sodium chloride  1,000 mL Intravenous Once  . sodium chloride flush  3 mL Intravenous Q12H  . [START ON 11/25/2015] traZODone  100 mg Oral QHS   Continuous Infusions: . 0.9 % NaCl with KCl 20 mEq / L 125 mL/hr at 11/24/15 1407     LOS: 3 days    Time spent: 25 minutes. Greater than 50% of this time was spent in direct contact with the patient coordinating care.     Phillips Climes, MD [facility] Pager [phone]  If 7PM-7AM, please contact night-coverage [email or web address] Password [email or web address] 11/24/2015, 2:17 PM

## 2015-11-25 DIAGNOSIS — K5100 Ulcerative (chronic) pancolitis without complications: Secondary | ICD-10-CM

## 2015-11-25 LAB — PROCALCITONIN: Procalcitonin: <0.1 ng/mL

## 2015-11-25 MED ORDER — HYDROCORTISONE 100 MG/60ML RE ENEM: 100.0000 mg | ENEMA | Freq: Every day | RECTAL | Status: DC

## 2015-11-25 MED ORDER — MOXIFLOXACIN HCL 400 MG PO TABS: 400.0000 mg | ORAL_TABLET | Freq: Every day | ORAL | Status: DC

## 2015-11-25 NOTE — Progress Notes (Signed)
Patient states understanding of discharge instructions.  

## 2015-11-25 NOTE — Discharge Summary (Signed)
Physician Discharge Summary  Wayne Avila CHY:[id number] DOB: [birth year] DOA: 11/21/2015  PCP: [name], MD  Admit date: 11/21/2015 Discharge date: 11/25/2015  Time spent: 45 minutes  Recommendations for Outpatient Follow-up:  -We'll be discharge home today. -Advised to follow-up with primary care provider and with Dr. fields within 2 weeks.   Discharge Diagnoses:  Active Problems:   Rectal bleeding   UC (ulcerative colitis) (HCC)   Bipolar I disorder, most recent episode depressed (HCC)   Proctitis   Acute colitis   Aspiration pneumonitis (HCC)   Chronic blood loss anemia   CAP (community acquired pneumonia)   Discharge Condition: Stable and improved  Filed Weights   11/22/15 0108 11/22/15 0500  Weight: 107.2 kg (236 lb 5.3 oz) 107.2 kg (236 lb 5.3 oz)    History of present illness:  As per Dr. Carles Collet on 6/14: Wayne Avila is a 25 y.o. male with medical history of ulcerative colitis, ADD, bipolar disorder, and asthma presented with one-day history of worsening right lower quadrant abdominal pain. The patient had EGD and colonoscopy on 11/20/2015. The EGD was normal. Colonoscopy showed severe erythema and edema with ulcers from the anal verge to 20 cm above without mucosal spurring consistent with ulcerative proctitis. The patient states that his abdominal pain began after he went home from his procedures on 11/20/2015 and worsened for the next 24 hours prompting him to come to the emergency department. His last bowel movement was in the morning of 11/21/2015 without any blood. He has some nausea without emesis. He had some subjective fevers and chills. In addition, for the past week he has been having some increased shortness of breath and coughing which has worsened since his procedure on 11/20/2015. Even prior to his prep for colonoscopy, the patient states that he has had more bowel movements and a bit more blood than usual in the past week. He denies any present chest  pain, headache, neck pain, hemoptysis, dysuria, hematuria.  In the emergency department, the patient was noted to have low-grade temperature of 99.3 with tachycardia heart rate 119. Blood pressure was 124/74 with oxygen saturation 98-100% on room air. BMP and LFTs were unremarkable. WBC was 9.6 with hemoglobin 10.3. CT of the abdomen and pelvis revealed mild fluffy left basilar opacity. There was thickening of the distal sigmoid colon. The patient was given 2 L normal saline and started on azithromycin and ceftriaxone.  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 -To consider biologic agent as an outpatient. - Discussed with Dr. Cherylann Parr, continue with Lialda during hospital stay, and on discharge can be resumed on his Apriso 1.5 g, and to continue with hydrocortisone enemas total of 30 days once at bedtime.  ?? CAP - CT abdomen and pelvis with evidence of left lung infiltrate , patient has had a nonproductive cough and a low-grade temperature. Will elect to treat empirically as an outpatient with a fluoroquinolone although his low-grade fever could certainly be caused by his proctitis.  Asthma - No active wheezing, continue Symbicort , Singulair and when necessary nebs  Bipolar disorder -Continue Depakote and Zoloft  ADHD -continue Strattera  Procedures:  None   Consultations:  Gastroenterology  Discharge Instructions  Discharge Instructions    Increase activity slowly    Complete by:  As directed  Medication List    TAKE these medications        albuterol 108 (90 Base) MCG/ACT inhaler  Commonly known as:  PROAIR HFA  Inhale 2 puffs into the lungs every 4 (four) hours as needed for wheezing. Persistent dry cough     albuterol (2.5 MG/3ML) 0.083% nebulizer solution  Commonly known as:  PROVENTIL    Inhale 3 mLs into the lungs every 6 (six) hours as needed. Shortness of breath     atomoxetine 25 MG capsule  Commonly known as:  STRATTERA  Take 25 mg by mouth daily. Takes with the 100 mg to make 125 mg daily     STRATTERA 100 MG capsule  Generic drug:  atomoxetine  take one capsule by mouth once daily     budesonide-formoterol 160-4.5 MCG/ACT inhaler  Commonly known as:  SYMBICORT  Inhale 2 puffs into the lungs 2 (two) times daily.     divalproex 500 MG DR tablet  Commonly known as:  DEPAKOTE  Take 1 tablet (500 mg total) by mouth 2 (two) times daily.     EPINEPHrine 0.3 mg/0.3 mL Soaj injection  Commonly known as:  EPI-PEN  Inject 0.3 mg into the muscle once.     fluticasone 50 MCG/ACT nasal spray  Commonly known as:  FLONASE  Place 1 spray into both nostrils daily.     HYDROcodone-acetaminophen 5-325 MG tablet  Commonly known as:  NORCO/VICODIN  Take 1 tablet by mouth every 6 (six) hours as needed for moderate pain.     hydrocortisone 100 MG/60ML enema  Commonly known as:  CORTENEMA  Place 1 enema (100 mg total) rectally at bedtime.     loratadine 10 MG tablet  Commonly known as:  CLARITIN  Take 10 mg by mouth at bedtime.     mesalamine 0.375 g 24 hr capsule  Commonly known as:  APRISO  Take 4 capsules (1.5 g total) by mouth daily.     mesalamine 1000 MG suppository  Commonly known as:  CANASA  Place 1 suppository (1,000 mg total) rectally at bedtime.     montelukast 10 MG tablet  Commonly known as:  SINGULAIR  Take 1 tablet (10 mg total) by mouth at bedtime.     moxifloxacin 400 MG tablet  Commonly known as:  AVELOX  Take 1 tablet (400 mg total) by mouth daily at 8 pm.     ranitidine 150 MG tablet  Commonly known as:  ZANTAC  Take 1 tablet (150 mg total) by mouth daily.     sertraline 100 MG tablet  Commonly known as:  ZOLOFT  Take 1 tablet (100 mg total) by mouth daily.     traZODone 100 MG tablet  Commonly known as:  DESYREL  Take one tablet  by mouth once daily at bedtime       Allergies  Allergen Reactions  . Amoxicillin-Pot Clavulanate Nausea And Vomiting  . Ibuprofen Other (See Comments)    Patient has ulcerative colitis  . Omeprazole Nausea And Vomiting  . Pineapple Swelling  . Strawberry Extract Swelling  . Tomato Rash       Follow-up Information    Follow up with [name], MD. Schedule an appointment as soon as possible for a visit in 2 weeks.   Specialty:  Internal Medicine   Contact information:   [address] [place] [address] [phone]       Follow up with Barney Drain, MD. Schedule an appointment as soon as possible  for a visit in 2 weeks.   Specialty:  Gastroenterology   Contact information:   [place] [address] [phone]        The results of significant diagnostics from this hospitalization (including imaging, microbiology, ancillary and laboratory) are listed below for reference.    Significant Diagnostic Studies: Dg Chest 2 View  10/30/2015  CLINICAL DATA:  Substernal chest pain. EXAM: CHEST  2 VIEW COMPARISON:  10/25/2015 FINDINGS: Persistent low lung volumes. The cardiomediastinal contours are normal. The lungs are clear. Pulmonary vasculature is normal. No consolidation, pleural effusion, or pneumothorax. No acute osseous abnormalities are seen. IMPRESSION: Hypoventilatory chest without acute process. Electronically Signed   By: Jeb Levering M.D.   On: 10/30/2015 03:43   Ct Abdomen Pelvis W Contrast  11/21/2015  CLINICAL DATA:  Acute onset of right lower quadrant abdominal pain. Recent colonoscopy. Initial encounter. EXAM: CT ABDOMEN AND PELVIS WITH CONTRAST TECHNIQUE: Multidetector CT imaging of the abdomen and pelvis was performed using the standard protocol following bolus administration of intravenous contrast. CONTRAST:  123m ISOVUE-300 IOPAMIDOL (ISOVUE-300) INJECTION 61% COMPARISON:  None. FINDINGS: Mild fluffy left  basilar airspace opacity is compatible with pneumonia. The liver and spleen are unremarkable in appearance. The gallbladder is within normal limits. The pancreas and adrenal glands are unremarkable. The kidneys are unremarkable in appearance. There is no evidence of hydronephrosis. No renal or ureteral stones are seen. No perinephric stranding is appreciated. No free fluid is identified. The small bowel is unremarkable in appearance. The stomach is within normal limits. No acute vascular abnormalities are seen. The appendix is normal in caliber, without evidence of appendicitis. Mild wall thickening is noted along the distal sigmoid colon, which may reflect an acute infectious or inflammatory process. There is no evidence of perforation or abscess formation. The bladder is mildly distended and grossly unremarkable. A small urachal remnant is incidentally noted. The prostate remains normal in size. No inguinal lymphadenopathy is seen. No acute osseous abnormalities are identified. IMPRESSION: 1. Mild wall thickening along the distal sigmoid colon, which may reflect an acute infectious or inflammatory process. 2. Mild left basilar pneumonia noted. Electronically Signed   By: JGarald BaldingM.D.   On: 11/21/2015 21:12   Dg Abd Acute W/chest  11/21/2015  CLINICAL DATA:  Abdominal pain after upper and lower endoscopy. EXAM: DG ABDOMEN ACUTE W/ 1V CHEST COMPARISON:  10/30/2015 chest radiograph. 09/05/2014 CT abdomen/pelvis. FINDINGS: Low lung volumes. Stable cardiomediastinal silhouette with normal heart size. No pneumothorax. No pleural effusion. No pulmonary edema. No acute consolidative airspace disease. No dilated small bowel loops or air-fluid levels. Mild stool and gas throughout the colon. No evidence of pneumatosis, pneumoperitoneum or pathologic soft tissue calcifications. IMPRESSION: 1. Low lung volumes with no active disease in the chest. 2. Nonobstructive bowel gas pattern. No evidence of pneumoperitoneum.  Electronically Signed   By: JIlona SorrelM.D.   On: 11/21/2015 19:39    Microbiology: Recent Results (from the past 240 hour(s))  MRSA PCR Screening     Status: None   Collection Time: 11/22/15  1:12 AM  Result Value Ref Range Status   MRSA by PCR NEGATIVE NEGATIVE Final    Comment:        The GeneXpert MRSA Assay (FDA approved for NASAL specimens only), is one component of a comprehensive MRSA colonization surveillance program. It is not intended to diagnose MRSA infection nor to guide or monitor treatment for MRSA infections.   C difficile quick  scan w PCR reflex     Status: None   Collection Time: 11/23/15 12:52 AM  Result Value Ref Range Status   C Diff antigen NEGATIVE NEGATIVE Final   C Diff toxin NEGATIVE NEGATIVE Final   C Diff interpretation Negative for toxigenic C. difficile  Final  Gastrointestinal Panel by PCR , Stool     Status: None   Collection Time: 11/23/15 12:52 AM  Result Value Ref Range Status   Campylobacter species NOT DETECTED NOT DETECTED Final   Plesimonas shigelloides NOT DETECTED NOT DETECTED Final   Salmonella species NOT DETECTED NOT DETECTED Final   Yersinia enterocolitica NOT DETECTED NOT DETECTED Final   Vibrio species NOT DETECTED NOT DETECTED Final   Vibrio cholerae NOT DETECTED NOT DETECTED Final   Enteroaggregative E coli (EAEC) NOT DETECTED NOT DETECTED Final   Enteropathogenic E coli (EPEC) NOT DETECTED NOT DETECTED Final   Enterotoxigenic E coli (ETEC) NOT DETECTED NOT DETECTED Final   Shiga like toxin producing E coli (STEC) NOT DETECTED NOT DETECTED Final   E. coli O157 NOT DETECTED NOT DETECTED Final   Shigella/Enteroinvasive E coli (EIEC) NOT DETECTED NOT DETECTED Final   Cryptosporidium NOT DETECTED NOT DETECTED Final   Cyclospora cayetanensis NOT DETECTED NOT DETECTED Final   Entamoeba histolytica NOT DETECTED NOT DETECTED Final   Giardia lamblia NOT DETECTED NOT DETECTED Final   Adenovirus F40/41 NOT DETECTED NOT DETECTED  Final   Astrovirus NOT DETECTED NOT DETECTED Final   Norovirus GI/GII NOT DETECTED NOT DETECTED Final   Rotavirus A NOT DETECTED NOT DETECTED Final   Sapovirus (I, II, IV, and V) NOT DETECTED NOT DETECTED Final     Labs: Basic Metabolic Panel:  Recent Labs Lab 11/21/15 1710 11/22/15 0414 11/23/15 0747 11/24/15 0613  NA 135 138 138 138  K 3.8 3.7 3.6 3.7  CL 106 106 107 107  CO2 25 24 23 24   GLUCOSE 88 83 97 88  BUN <5* <5* <5* <5*  CREATININE 0.96 0.98 0.93 0.91  CALCIUM 8.7* 8.4* 8.0* 8.4*   Liver Function Tests:  Recent Labs Lab 11/21/15 1710  AST 16  ALT 12*  ALKPHOS 46  BILITOT 0.2*  PROT 6.7  ALBUMIN 3.6   No results for input(s): LIPASE, AMYLASE in the last 168 hours. No results for input(s): AMMONIA in the last 168 hours. CBC:  Recent Labs Lab 11/21/15 1710 11/21/15 2206 11/22/15 0414 11/23/15 0747 11/24/15 0613  WBC 9.6 8.9 10.9* 9.2 8.3  NEUTROABS  --  6.8  --   --   --   HGB 10.3* 9.4* 9.3* 8.8* 9.5*  HCT 30.2* 27.2* 27.1*  26.7* 26.0* 28.3*  MCV 84.4 84.2 83.9 83.6 84.0  PLT 283 257 292 250 305   Cardiac Enzymes: No results for input(s): CKTOTAL, CKMB, CKMBINDEX, TROPONINI in the last 168 hours. BNP: BNP (last 3 results) No results for input(s): BNP in the last 8760 hours.  ProBNP (last 3 results) No results for input(s): PROBNP in the last 8760 hours.  CBG: No results for input(s): GLUCAP in the last 168 hours.     SignedLelon Frohlich  [facility] Pager: [phone] 11/25/2015, 3:07 PM

## 2015-11-28 LAB — QUANTIFERON TB GOLD ASSAY (BLOOD)

## 2015-11-28 LAB — QUANTIFERON IN TUBE
QFT TB AG MINUS NIL VALUE: <0 IU/mL
QUANTIFERON MITOGEN VALUE: 0.36 IU/mL
QUANTIFERON TB AG VALUE: 0.02 IU/mL
QUANTIFERON TB GOLD: UNDETERMINED
Quantiferon Nil Value: 0.03 IU/mL

## 2015-12-05 LAB — HEPATITIS B SURFACE ANTIGEN: Hepatitis B Surface Ag: NEGATIVE

## 2015-12-12 ENCOUNTER — Telehealth: Payer: Self-pay | Admitting: Gastroenterology

## 2015-12-12 ENCOUNTER — Other Ambulatory Visit: Payer: Self-pay

## 2015-12-12 DIAGNOSIS — R1010 Upper abdominal pain, unspecified: Secondary | ICD-10-CM

## 2015-12-12 NOTE — Telephone Encounter (Signed)
Orders are in and mother is aware that he can just walk in and have it done

## 2015-12-12 NOTE — Telephone Encounter (Signed)
ON RECALL  °

## 2015-12-12 NOTE — Telephone Encounter (Signed)
PLeASE CALL PT's  Mother. HIS HEP B test WAS NEG & his TB TEST indeterminate. HE NEEDS A CXR PA/Lat to make sure he has no evidence of prior infection for TB in his  Lungs. THIS SHOULD BE COMPLETE PRIOR TO STARTING REMICADE because HIS BIOPSIES SHOW PANCOLITIS & severe proctitis. HE NEEDS REMICADE 5 mg/ kg   Day 0, 2,6 weeks then every 8 weeks. OPV IN 2 mos with SLF. She should CALL WITH QUESTIONS OR CONCERNS. Opv in 2 mos.   No premeds for Remicade unless he has a reaction.

## 2015-12-12 NOTE — Telephone Encounter (Signed)
LMOM to call.

## 2015-12-12 NOTE — Telephone Encounter (Signed)
Pt's mom is aware that pt will need to do the CXR.  She is aware Ginger will put in the order and call her.  After results, he will begin Remicade and we will let him know about that.

## 2015-12-12 NOTE — Telephone Encounter (Signed)
Routing to Ginger.

## 2015-12-13 ENCOUNTER — Ambulatory Visit (HOSPITAL_COMMUNITY)
Admission: RE | Admit: 2015-12-13 | Discharge: 2015-12-13 | Disposition: A | Discharge status: Home / Self Care | Payer: Medicaid Other | Source: Ambulatory Visit | Attending: Gastroenterology | Admitting: Gastroenterology

## 2015-12-13 DIAGNOSIS — R1010 Upper abdominal pain, unspecified: Secondary | ICD-10-CM | POA: Insufficient documentation

## 2015-12-14 ENCOUNTER — Emergency Department (HOSPITAL_COMMUNITY)
Admission: EM | Admit: 2015-12-14 | Discharge: 2015-12-14 | Disposition: A | Discharge status: Home / Self Care | Payer: Medicaid Other | Attending: Emergency Medicine | Admitting: Emergency Medicine

## 2015-12-14 ENCOUNTER — Encounter (HOSPITAL_COMMUNITY): Payer: Self-pay | Admitting: Emergency Medicine

## 2015-12-14 DIAGNOSIS — Z79899 Other long term (current) drug therapy: Secondary | ICD-10-CM | POA: Diagnosis not present

## 2015-12-14 DIAGNOSIS — F331 Major depressive disorder, recurrent, moderate: Secondary | ICD-10-CM | POA: Insufficient documentation

## 2015-12-14 DIAGNOSIS — R1031 Right lower quadrant pain: Secondary | ICD-10-CM | POA: Diagnosis present

## 2015-12-14 DIAGNOSIS — R109 Unspecified abdominal pain: Secondary | ICD-10-CM

## 2015-12-14 DIAGNOSIS — J45909 Unspecified asthma, uncomplicated: Secondary | ICD-10-CM | POA: Diagnosis not present

## 2015-12-14 DIAGNOSIS — K6289 Other specified diseases of anus and rectum: Secondary | ICD-10-CM | POA: Diagnosis not present

## 2015-12-14 DIAGNOSIS — F909 Attention-deficit hyperactivity disorder, unspecified type: Secondary | ICD-10-CM | POA: Insufficient documentation

## 2015-12-14 DIAGNOSIS — K5100 Ulcerative (chronic) pancolitis without complications: Secondary | ICD-10-CM | POA: Diagnosis not present

## 2015-12-14 DIAGNOSIS — Z87891 Personal history of nicotine dependence: Secondary | ICD-10-CM | POA: Diagnosis not present

## 2015-12-14 DIAGNOSIS — Z8669 Personal history of other diseases of the nervous system and sense organs: Secondary | ICD-10-CM | POA: Insufficient documentation

## 2015-12-14 LAB — COMPREHENSIVE METABOLIC PANEL
ALT: 16 U/L — ABNORMAL LOW (ref 17–63)
AST: 18 U/L (ref 15–41)
Albumin: 3.5 g/dL (ref 3.5–5.0)
Alkaline Phosphatase: 51 U/L (ref 38–126)
Anion gap: 5 (ref 5–15)
BUN: 8 mg/dL (ref 6–20)
CO2: 25 mmol/L (ref 22–32)
Calcium: 8.5 mg/dL — ABNORMAL LOW (ref 8.9–10.3)
Chloride: 107 mmol/L (ref 101–111)
Creatinine, Ser: 0.82 mg/dL (ref 0.61–1.24)
GFR calc Af Amer: >60 mL/min (ref >60)
GFR calc non Af Amer: >60 mL/min (ref >60)
Glucose, Bld: 104 mg/dL — ABNORMAL HIGH (ref 65–99)
Potassium: 3.8 mmol/L (ref 3.5–5.1)
Sodium: 137 mmol/L (ref 135–145)
Total Bilirubin: 0.4 mg/dL (ref 0.3–1.2)
Total Protein: 6.7 g/dL (ref 6.5–8.1)

## 2015-12-14 LAB — CBC WITH DIFFERENTIAL/PLATELET
Basophils Absolute: 0 10*3/uL (ref 0.0–0.1)
Basophils Relative: 0 %
Eosinophils Absolute: 0.7 10*3/uL (ref 0.0–0.7)
Eosinophils Relative: 13 %
HCT: 28.6 % — ABNORMAL LOW (ref 39.0–52.0)
Hemoglobin: 9.3 g/dL — ABNORMAL LOW (ref 13.0–17.0)
Lymphocytes Relative: 26 %
Lymphs Abs: 1.5 10*3/uL (ref 0.7–4.0)
MCH: 26.8 pg (ref 26.0–34.0)
MCHC: 32.5 g/dL (ref 30.0–36.0)
MCV: 82.4 fL (ref 78.0–100.0)
Monocytes Absolute: 0.7 10*3/uL (ref 0.1–1.0)
Monocytes Relative: 11 %
Neutro Abs: 2.8 10*3/uL (ref 1.7–7.7)
Neutrophils Relative %: 50 %
Platelets: 349 10*3/uL (ref 150–400)
RBC: 3.47 MIL/uL — ABNORMAL LOW (ref 4.22–5.81)
RDW: 13 % (ref 11.5–15.5)
WBC: 5.7 10*3/uL (ref 4.0–10.5)

## 2015-12-14 LAB — C-REACTIVE PROTEIN: CRP: 0.7 mg/dL (ref <1.0)

## 2015-12-14 LAB — POC OCCULT BLOOD, ED: Fecal Occult Bld: NEGATIVE

## 2015-12-14 LAB — LIPASE, BLOOD: Lipase: 24 U/L (ref 11–51)

## 2015-12-14 MED ORDER — PREDNISONE 20 MG PO TABS
30.0000 mg | ORAL_TABLET | Freq: Once | ORAL | Status: AC
  Administered 2015-12-14: 30 mg via ORAL
  Filled 2015-12-14: qty 1

## 2015-12-14 MED ORDER — MORPHINE SULFATE (PF) 4 MG/ML IV SOLN
4.0000 mg | Freq: Once | INTRAVENOUS | Status: AC
Start: 2015-12-14 — End: 2015-12-14
  Administered 2015-12-14: 4 mg via INTRAVENOUS
  Filled 2015-12-14: qty 1

## 2015-12-14 MED ORDER — SODIUM CHLORIDE 0.9 % IV BOLUS (SEPSIS)
1000.0000 mL | Freq: Once | INTRAVENOUS | Status: AC
  Administered 2015-12-14: 1000 mL via INTRAVENOUS

## 2015-12-14 MED ORDER — PREDNISONE 10 MG PO TABS: 30.0000 mg | ORAL_TABLET | Freq: Every day | ORAL | Status: DC

## 2015-12-14 NOTE — ED Provider Notes (Signed)
Emergency Department Provider Note  Time seen: Approximately 8:18 AM  I have reviewed the triage vital signs and the nursing notes.   HISTORY  Chief Complaint Abdominal Pain  HPI Wayne Avila is a 25 y.o. male with PMH of UC presents to the emergency department for evaluation of worsening central to slightly right-sided abdominal pain, near constant diarrhea, increased blood in his bowel movements. He describes a blood is bright red and consistent with prior UC flares. Since discharge the patient's mother states he's had gradually worsening diarrhea despite being compliant with medications including hydrocortisone enemas. He denies any associated vomiting, fevers, shaking chills. He has an appointment to see his gastroenterologist, Dr. [name], in 4 days time but has not seen since discharge. Mom reports that the diarrhea is almost constant and she is concerned that the outpatient plan is not working.    Past Medical History  Diagnosis Date  . Asthma   . ADHD (attention deficit hyperactivity disorder)   . Bipolar 1 disorder ([place])   . GERD (gastroesophageal reflux disease)   . Depression   . Anxiety   . Mental developmental delay 11/15/2012  . PONV (postoperative nausea and vomiting)   . Hearing loss   . HOH (hard of hearing)   . Abdominal pain, chronic, right upper quadrant   . Colitis   . Seizures [facility])     Patient Active Problem List   Diagnosis Date Noted  . CAP (community acquired pneumonia)   . 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 Acute colitis 11/21/2015  . Aspiration pneumonitis ([place]) 11/21/2015  . Chronic blood loss anemia 11/21/2015  . Bipolar I disorder, most recent episode depressed ([place])   . Bipolar 1 disorder, manic, moderate ([place]) 11/09/2014  . Suicidal ideation   . Major depressive disorder, recurrent episode, moderate ([place])   . Oral candida 02/23/2014  . UC (ulcerative colitis) ([place]) 10/25/2013  . Abdominal pain, acute 09/20/2013  . Rectal bleeding 08/09/2013  .  Unintentional weight loss 08/09/2013  . RUQ pain 08/09/2013  . Unspecified gastritis and gastroduodenitis without mention of hemorrhage 04/20/2013  . Upper abdominal pain 12/01/2012  . Nausea with vomiting 12/01/2012  . Mental developmental delay 11/15/2012    Past Surgical History  Procedure Laterality Date  . Tonsillectomy    . Adenoidectomy    . Esophagogastroduodenoscopy (egd) with propofol N/A 12/07/2012    SLF:The mucosa of the esophagus appeared normal Non-erosive gastritis (inflammation) was found in the gastric antrum; multiple biopsies The duodenal mucosa showed no abnormalities in the bulb and second portion of the duodenum  . Biopsy N/A 12/07/2012    Procedure: GASTRIC BIOPSIES;  Surgeon: [name], MD;  Location: [facility] ORS;  Service: Endoscopy;  Laterality: N/A;  . Colonoscopy with propofol N/A 08/30/2013    WJX:BJYNWG mucosa in the terminal ileum/COLITIS/ MILD PROCTITIS. 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  Marland Kitchen Biopsy N/A 08/30/2013    Procedure: BIOPSY;  Surgeon: [name], MD;  Location: [facility]S;  Service: Endoscopy;  Laterality: N/A;  right colon,transverse colon, left colon, rectal biopsies  . Esophagogastroduodenoscopy  11/20/2015    Dr. [name]: Normal exam, stomach biopsied and duodenal biopsy.. Benign biopsies.  . Colonoscopy  11/20/2015    Dr. [name]: Severe erythema, edema, ulcers from the anal verge to 20 cm above the anal verge without mucosal sparing, remaining colon and terminal ileum appeared normal. Biopsies from the rectum revealed fulminant active  chronic colitis consistent with IBD. Pathology from terminal ileum revealed intramucosal lymphoid aggregates. Remaining colon random biopsies with inactive chronic colitis  . Colonoscopy with propofol N/A 11/20/2015    Procedure: COLONOSCOPY WITH PROPOFOL;  Surgeon: [name], MD;  Location: [facility]  SU[facility];  Service: Endoscopy;  Laterality: N/A;  1300 - moved to 8:30, pt knows to arrive at 7:00 per office  . Esophagogastroduodenoscopy (egd) with propofol N/A 11/20/2015    Procedure: ESOPHAGOGASTRODUODENOSCOPY (EGD) WITH PROPOFOL;  Surgeon: [name], MD;  Location: [facility];  Service: Endoscopy;  Laterality: N/A;  . Biopsy  11/20/2015    Procedure: BIOPSY;  Surgeon: [name], MD;  Location: [facility];  Service: Endoscopy;;  ileal, right colon biopsy, left colon, rectum    Current Outpatient Rx  Name  Route  Sig  Dispense  Refill  . atomoxetine (STRATTERA) 25 MG capsule   Oral   Take 25 mg by mouth daily. Takes with the 100 mg to make 125 mg daily         . budesonide-formoterol (SYMBICORT) 160-4.5 MCG/ACT inhaler   Inhalation   Inhale 2 puffs into the lungs 2 (two) times daily.         . divalproex (DEPAKOTE) 500 MG DR tablet   Oral   Take 1 tablet (500 mg total) by mouth 2 (two) times daily.   60 tablet   2   . HYDROcodone-acetaminophen (NORCO/VICODIN) 5-325 MG tablet   Oral   Take 1 tablet by mouth every 6 (six) hours as needed for moderate pain.   15 tablet   0   . loratadine (CLARITIN) 10 MG tablet   Oral   Take 10 mg by mouth at bedtime.          . mesalamine (APRISO) 0.375 G 24 hr capsule   Oral   Take 4 capsules (1.5 g total) by mouth daily.   124 capsule   11   . mesalamine (CANASA) 1000 MG suppository   Rectal   Place 1 suppository (1,000 mg total) rectally at bedtime.   30 suppository   1   . montelukast (SINGULAIR) 10 MG tablet   Oral   Take 1 tablet (10 mg total) by mouth at bedtime.   30 tablet   3   . ranitidine (ZANTAC) 150 MG tablet   Oral   Take 1 tablet (150 mg total) by mouth daily.         . sertraline (ZOLOFT) 100 MG tablet   Oral   Take 1 tablet (100 mg total) by mouth daily.   30 tablet   0   . STRATTERA 100 MG capsule      take one capsule by mouth once daily      5     Dispense as written.   .  traZODone (DESYREL) 100 MG tablet      Take one tablet by mouth once daily at bedtime      5   . albuterol (PROAIR HFA) 108 (90 BASE) MCG/ACT inhaler   Inhalation   Inhale 2 puffs into the lungs every 4 (four) hours as needed for wheezing. Persistent dry cough   6.7 g   2   . albuterol (PROVENTIL) (2.5 MG/3ML) 0.083% nebulizer solution   Inhalation   Inhale 3 mLs into the lungs every 6 (six) hours as needed. Shortness of breath      11   . EPINEPHrine 0.3 mg/0.3 mL IJ SOAJ injection  Intramuscular   Inject 0.3 mg into the muscle once.         . fluticasone (FLONASE) 50 MCG/ACT nasal spray   Each Nare   Place 1 spray into both nostrils daily. Patient taking differently: Place 1 spray into both nostrils daily as needed for allergies.       5   . hydrocortisone (CORTENEMA) 100 MG/60ML enema   Rectal   Place 1 enema (100 mg total) rectally at bedtime.   30 enema   1   . moxifloxacin (AVELOX) 400 MG tablet   Oral   Take 1 tablet (400 mg total) by mouth daily at 8 pm. Patient not taking: Reported on 12/14/2015   7 tablet   0     Allergies Amoxicillin-pot clavulanate; Ibuprofen; Omeprazole; Pineapple; Strawberry extract; and Tomato  Family History  Problem Relation Age of Onset  . Asthma Mother   . Ulcers Mother   . Bipolar disorder Mother   . Colon cancer Neg Hx   . Liver disease Neg Hx   . ADD / ADHD Father   . Diabetes Maternal Grandmother   . Diabetes Maternal Grandfather     Social History Social History  Substance Use Topics  . Smoking status: Former Smoker -- 0.00 packs/day for 2 years    Quit date: 10/06/2012  . Smokeless tobacco: Never Used  . Alcohol Use: No     Comment: denies usage    Review of Systems  Constitutional: No fever/chills Eyes: No visual changes. ENT: No sore throat. Cardiovascular: Denies chest pain. Respiratory: Denies shortness of breath. Gastrointestinal: Positive abdominal pain. No nausea, no vomiting. Positive bloody  diarrhea. No constipation. Genitourinary: Negative for dysuria. Musculoskeletal: Negative for back pain. Skin: Negative for rash. Neurological: Negative for headaches, focal weakness or numbness.  10-point ROS otherwise negative.  ____________________________________________   PHYSICAL EXAM:  VITAL SIGNS: ED Triage Vitals  Enc Vitals Group     BP 12/14/15 0801 133/75 mmHg     Pulse Rate 12/14/15 0801 101     Resp 12/14/15 0801 18     Temp 12/14/15 0801 98.6 F (37 C)     Temp Source 12/14/15 0801 Oral     SpO2 12/14/15 0801 18 %     Pain Score 12/14/15 0801 7   Constitutional: Alert and oriented. Well appearing and in no acute distress. Eyes: Conjunctivae are normal.  Head: Atraumatic. Mouth/Throat: Mucous membranes are moist.  Oropharynx non-erythematous. Neck: No stridor.   Cardiovascular: Tachycardia. Good peripheral circulation. Grossly normal heart sounds.   Respiratory: Normal respiratory effort.  No retractions. Lungs CTAB. Gastrointestinal: Soft with mild/moderate periumbilical tenderness and RLQ tenderness. No distention.  Musculoskeletal: No lower extremity tenderness nor edema. No gross deformities of extremities. Neurologic:  Normal speech and language. No gross focal neurologic deficits are appreciated.  Skin:  Skin is warm, dry and intact. No rash noted. Psychiatric: Mood and affect are normal. Speech and behavior are normal.  ____________________________________________   LABS (all labs ordered are listed, but only abnormal results are displayed)  Labs Reviewed  COMPREHENSIVE METABOLIC PANEL - Abnormal; Notable for the following:    Glucose, Bld 104 (*)    Calcium 8.5 (*)    ALT 16 (*)    All other components within normal limits  CBC WITH DIFFERENTIAL/PLATELET - Abnormal; Notable for the following:    RBC 3.47 (*)    Hemoglobin 9.3 (*)    HCT 28.6 (*)    All other components within normal limits  LIPASE, BLOOD  C-REACTIVE PROTEIN  POC OCCULT  BLOOD, ED   HEMOGLOBIN  Date Value Ref Range Status  12/14/2015 9.3* 13.0 - 17.0 g/dL Final  11/24/2015 9.5* 13.0 - 17.0 g/dL Final  11/23/2015 8.8* 13.0 - 17.0 g/dL Final  11/22/2015 9.3* 13.0 - 17.0 g/dL Final    ____________________________________________  RADIOLOGY  None ____________________________________________   PROCEDURES  Procedure(s) performed:   Procedures  None ____________________________________________   INITIAL IMPRESSION / ASSESSMENT AND PLAN / ED COURSE  Pertinent labs & imaging results that were available during my care of the patient were reviewed by me and considered in my medical decision making (see chart for details).  Patient with past medical history of ulcerative colitis presents to the emergency department for evaluation of worsening diarrhea with blood and abdominal pain despite compliance with medications. Followed by a local gastroenterologist. Pain feels similar to prior ulcerative colitis flares in the recent past. He is overall well-appearing seems moderately dehydrated. His abdomen is soft with some periumbilical tenderness. Low suspicion for alternate intra-abdominal etiology for her pain such as appendicitis or intra-abdominal abscess based on my exam. Plan for labs, IV fluids, pain medication.   09:43 AM Labs are generally reassuring. Hemoccult negative. Remains slightly tachycardic. Paged patient's GI for discussion regarding further ED treatment and disposition.   10:18 AM   Spoke with Dr. Buford Dresser regarding the patient's plan from the ED. The patient remains well-appearing with benign abdominal and rectal exam. Currently getting additional 1L IVF. Plan to add Prednisone 30 mg PO daily for 10 days in addition to Apriso and Hydrocortisone enemas. Patient has an outpatient appointment in the next 4 days. Updated patient and mom who are in agreement with the plan and feel comfortable at the time of discharge. CRP is pending but at this  point an elevated level would not alter my plan for the patient. GI can follow result in the office next week for reference as needed.    ____________________________________________  FINAL CLINICAL IMPRESSION(S) / ED DIAGNOSES  Final diagnoses:  Abdominal pain, acute  Ulcerative pancolitis without complication (HCC)  Proctitis     MEDICATIONS GIVEN DURING THIS VISIT:  Medications  morphine 4 MG/ML injection 4 mg (4 mg Intravenous Given 12/14/15 0827)  sodium chloride 0.9 % bolus 1,000 mL (0 mLs Intravenous Stopped 12/14/15 0919)  sodium chloride 0.9 % bolus 1,000 mL (1,000 mLs Intravenous New Bag/Given 12/14/15 1008)  predniSONE (DELTASONE) tablet 30 mg (30 mg Oral Given 12/14/15 1030)     NEW OUTPATIENT MEDICATIONS STARTED DURING THIS VISIT:  New Prescriptions   PREDNISONE (DELTASONE) 10 MG TABLET    Take 3 tablets (30 mg total) by mouth daily.    Note:  This document was prepared using Dragon voice recognition software and may include unintentional dictation errors.  Nanda Quinton, MD Emergency Medicine  Margette Fast, MD 12/14/15 1055

## 2015-12-14 NOTE — ED Notes (Signed)
The 18% Oxygen documentation was a mistake, sorry!

## 2015-12-14 NOTE — Progress Notes (Signed)
Quick Note:  Pt is currently admitted. ______

## 2015-12-14 NOTE — Discharge Instructions (Signed)
You were seen in the ED today with abdominal pain likely due to a colitis flare. We discussed the case with your gastroenterologist and are starting you on an oral steroid. Take the Prednisone as directed. Continue your Apriso and enemas as previously prescribed.   Return to the ED with any sudden severe abdominal pain, fever, chills, or large amount of rectal bleeding. Follow up with your gastroenterologist as scheduled on Tuesday.  Colitis Colitis is inflammation of the colon. Colitis may last a short time (acute) or it may last a Londyn Wotton time (chronic). CAUSES This condition may be caused by:  Viruses.  Bacteria.  Reactions to medicine.  Certain autoimmune diseases, such as Crohn disease or ulcerative colitis. SYMPTOMS Symptoms of this condition include:  Diarrhea.  Passing bloody or tarry stool.  Pain.  Fever.  Vomiting.  Tiredness (fatigue).  Weight loss.  Bloating.  Sudden increase in abdominal pain.  Having fewer bowel movements than usual. DIAGNOSIS This condition is diagnosed with a stool test or a blood test. You may also have other tests, including X-rays, a CT scan, or a colonoscopy. TREATMENT Treatment may include:  Resting the bowel. This involves not eating or drinking for a period of time.  Fluids that are given through an IV tube.  Medicine for pain and diarrhea.  Antibiotic medicines.  Cortisone medicines.  Surgery. HOME CARE INSTRUCTIONS Eating and Drinking  Follow instructions from your health care provider about eating or drinking restrictions.  Drink enough fluid to keep your urine clear or pale yellow.  Work with a dietitian to determine which foods cause your condition to flare up.  Avoid foods that cause flare-ups.  Eat a well-balanced diet. Medicines  Take over-the-counter and prescription medicines only as told by your health care provider.  If you were prescribed an antibiotic medicine, take it as told by your health care  provider. Do not stop taking the antibiotic even if you start to feel better. General Instructions  Keep all follow-up visits as told by your health care provider. This is important. SEEK MEDICAL CARE IF:  Your symptoms do not go away.  You develop new symptoms. SEEK IMMEDIATE MEDICAL CARE IF:  You have a fever that does not go away with treatment.  You develop chills.  You have extreme weakness, fainting, or dehydration.  You have repeated vomiting.  You develop severe pain in your abdomen.  You pass bloody or tarry stool.   This information is not intended to replace advice given to you by your health care provider. Make sure you discuss any questions you have with your health care provider.   Document Released: 07/03/2004 Document Revised: 02/14/2015 Document Reviewed: 09/18/2014 Elsevier Interactive Patient Education [employer].

## 2015-12-14 NOTE — ED Notes (Signed)
Pt was inpatient for colitis, continues to have diarrhea, onset of pain last night

## 2015-12-16 ENCOUNTER — Emergency Department (HOSPITAL_COMMUNITY)
Admission: EM | Admit: 2015-12-16 | Discharge: 2015-12-16 | Disposition: A | Discharge status: Home / Self Care | Payer: Medicaid Other | Attending: Emergency Medicine | Admitting: Emergency Medicine

## 2015-12-16 ENCOUNTER — Encounter (HOSPITAL_COMMUNITY): Payer: Self-pay | Admitting: Emergency Medicine

## 2015-12-16 DIAGNOSIS — R34 Anuria and oliguria: Secondary | ICD-10-CM | POA: Diagnosis not present

## 2015-12-16 DIAGNOSIS — R109 Unspecified abdominal pain: Secondary | ICD-10-CM

## 2015-12-16 DIAGNOSIS — R1084 Generalized abdominal pain: Secondary | ICD-10-CM | POA: Insufficient documentation

## 2015-12-16 DIAGNOSIS — F329 Major depressive disorder, single episode, unspecified: Secondary | ICD-10-CM | POA: Insufficient documentation

## 2015-12-16 DIAGNOSIS — J45909 Unspecified asthma, uncomplicated: Secondary | ICD-10-CM | POA: Insufficient documentation

## 2015-12-16 DIAGNOSIS — R197 Diarrhea, unspecified: Secondary | ICD-10-CM

## 2015-12-16 DIAGNOSIS — Z79899 Other long term (current) drug therapy: Secondary | ICD-10-CM | POA: Diagnosis not present

## 2015-12-16 DIAGNOSIS — Z87891 Personal history of nicotine dependence: Secondary | ICD-10-CM | POA: Insufficient documentation

## 2015-12-16 LAB — URINALYSIS, ROUTINE W REFLEX MICROSCOPIC
Bilirubin Urine: NEGATIVE
Glucose, UA: NEGATIVE mg/dL
Hgb urine dipstick: NEGATIVE
Ketones, ur: NEGATIVE mg/dL
Leukocytes, UA: NEGATIVE
Nitrite: NEGATIVE
Protein, ur: NEGATIVE mg/dL
Specific Gravity, Urine: 1.015 (ref 1.005–1.030)
pH: 6.5 (ref 5.0–8.0)

## 2015-12-16 LAB — COMPREHENSIVE METABOLIC PANEL
ALT: 16 U/L — ABNORMAL LOW (ref 17–63)
AST: 17 U/L (ref 15–41)
Albumin: 3.6 g/dL (ref 3.5–5.0)
Alkaline Phosphatase: 51 U/L (ref 38–126)
Anion gap: 7 (ref 5–15)
BUN: 9 mg/dL (ref 6–20)
CO2: 25 mmol/L (ref 22–32)
Calcium: 8.8 mg/dL — ABNORMAL LOW (ref 8.9–10.3)
Chloride: 106 mmol/L (ref 101–111)
Creatinine, Ser: 0.84 mg/dL (ref 0.61–1.24)
GFR calc Af Amer: >60 mL/min (ref >60)
GFR calc non Af Amer: >60 mL/min (ref >60)
Glucose, Bld: 101 mg/dL — ABNORMAL HIGH (ref 65–99)
Potassium: 3.9 mmol/L (ref 3.5–5.1)
Sodium: 138 mmol/L (ref 135–145)
Total Bilirubin: 0.3 mg/dL (ref 0.3–1.2)
Total Protein: 6.9 g/dL (ref 6.5–8.1)

## 2015-12-16 LAB — CBC
HCT: 29 % — ABNORMAL LOW (ref 39.0–52.0)
Hemoglobin: 9.3 g/dL — ABNORMAL LOW (ref 13.0–17.0)
MCH: 26.5 pg (ref 26.0–34.0)
MCHC: 32.1 g/dL (ref 30.0–36.0)
MCV: 82.6 fL (ref 78.0–100.0)
Platelets: 343 10*3/uL (ref 150–400)
RBC: 3.51 MIL/uL — ABNORMAL LOW (ref 4.22–5.81)
RDW: 12.9 % (ref 11.5–15.5)
WBC: 6.7 10*3/uL (ref 4.0–10.5)

## 2015-12-16 LAB — LIPASE, BLOOD: Lipase: 25 U/L (ref 11–51)

## 2015-12-16 MED ORDER — MORPHINE SULFATE (PF) 4 MG/ML IV SOLN
4.0000 mg | Freq: Once | INTRAVENOUS | Status: AC
  Administered 2015-12-16: 4 mg via INTRAVENOUS
  Filled 2015-12-16: qty 1

## 2015-12-16 MED ORDER — ONDANSETRON HCL 4 MG/2ML IJ SOLN
4.0000 mg | Freq: Once | INTRAMUSCULAR | Status: AC
  Administered 2015-12-16: 4 mg via INTRAVENOUS
  Filled 2015-12-16: qty 2

## 2015-12-16 MED ORDER — SODIUM CHLORIDE 0.9 % IV BOLUS (SEPSIS)
1000.0000 mL | Freq: Once | INTRAVENOUS | Status: AC
  Administered 2015-12-16: 1000 mL via INTRAVENOUS

## 2015-12-16 NOTE — ED Provider Notes (Signed)
CSN: [id number]     Arrival date & time 12/16/15  1127 History   First MD Initiated Contact with Patient 12/16/15 1451     Chief Complaint  Patient presents with  . Abdominal Pain    Patient is a 25 y.o. male presenting with abdominal pain. The history is provided by the patient and a parent.  Abdominal Pain Pain location:  Generalized Pain quality: aching   Pain severity:  Moderate Onset quality:  Gradual Duration:  1 day Timing:  Constant Progression:  Worsening Chronicity:  Recurrent Relieved by:  Nothing Worsened by:  Movement and palpation Associated symptoms: diarrhea and hematochezia   Associated symptoms: no dysuria, no fever and no vomiting   Patient with h/o "colitis" who presents for recurrent abd pain and bloody diarrhea He reports he was seen in the ED on 12/14/15 for abd pain, felt improved and went home He reports over the past day the pain/diarrhea has returned No fever/vomting No other new complaints No surgical history reported  Past Medical History  Diagnosis Date  . Asthma   . ADHD (attention deficit hyperactivity disorder)   . Bipolar 1 disorder ([place])   . GERD (gastroesophageal reflux disease)   . Depression   . Anxiety   . Mental developmental delay 11/15/2012  . PONV (postoperative nausea and vomiting)   . Hearing loss   . HOH (hard of hearing)   . Abdominal pain, chronic, right upper quadrant   . Colitis   . Seizures [facility])    Past Surgical History  Procedure Laterality Date  . Tonsillectomy    . Adenoidectomy    . Esophagogastroduodenoscopy (egd) with propofol N/A 12/07/2012    SLF:The mucosa of the esophagus appeared normal Non-erosive gastritis (inflammation) was found in the gastric antrum; multiple biopsies The duodenal mucosa showed no abnormalities in the bulb and second portion of the duodenum  . Biopsy N/A 12/07/2012    Procedure: GASTRIC BIOPSIES;  Surgeon: [name], MD;  Location: [facility] OR[facility];  Service: Endoscopy;  Laterality: N/A;  .  Colonoscopy with propofol N/A 08/30/2013    BCW:UGQBVQ mucosa in the terminal ileum/COLITIS/ MILD PROCTITIS. 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  Marland Kitchen Biopsy N/A 08/30/2013    Procedure: BIOPSY;  Surgeon: [name], MD;  Location: [facility];  Service: Endoscopy;  Laterality: N/A;  right colon,transverse colon, left colon, rectal biopsies  . Esophagogastroduodenoscopy  11/20/2015    Dr. [name]: Normal exam, stomach biopsied and duodenal biopsy.. Benign biopsies.  . Colonoscopy  11/20/2015    Dr. [name]: Severe erythema, edema, ulcers from the anal verge to 20 cm above the anal verge without mucosal sparing, remaining colon and terminal ileum appeared normal. Biopsies from the rectum revealed fulminant active chronic colitis consistent with IBD. Pathology from terminal ileum revealed intramucosal lymphoid aggregates. Remaining colon random biopsies with inactive chronic colitis  . Colonoscopy with propofol N/A 11/20/2015    Procedure: COLONOSCOPY WITH PROPOFOL;  Surgeon: [name], MD;  Location: [facility];  Service: Endoscopy;  Laterality: N/A;  1300 - moved to 8:30, pt knows to arrive at 7:00 per office  . Esophagogastroduodenoscopy (egd) with propofol N/A 11/20/2015    Procedure: ESOPHAGOGASTRODUODENOSCOPY (EGD) WITH PROPOFOL;  Surgeon: [name], MD;  Location: [facility];  Service: Endoscopy;  Laterality: N/A;  . Biopsy  11/20/2015    Procedure: BIOPSY;  Surgeon: [name], MD;  Location:  [facility];  Service: Endoscopy;;  ileal, right colon biopsy, left colon, rectum   Family History  Problem Relation Age of Onset  . Asthma Mother   . Ulcers Mother   . Bipolar disorder Mother   . Colon cancer Neg Hx   . Liver disease Neg Hx   . ADD / ADHD Father   . Diabetes Maternal Grandmother   . Diabetes Maternal Grandfather    Social History  Substance Use Topics   . Smoking status: Former Smoker -- 0.00 packs/day for 2 years    Quit date: 10/06/2012  . Smokeless tobacco: Never Used  . Alcohol Use: No     Comment: denies usage    Review of Systems  Constitutional: Negative for fever.  Gastrointestinal: Positive for abdominal pain, diarrhea and hematochezia. Negative for vomiting.  Genitourinary: Positive for decreased urine volume. Negative for dysuria and testicular pain.  All other systems reviewed and are negative.     Allergies  Amoxicillin-pot clavulanate; Ibuprofen; Omeprazole; Pineapple; Strawberry extract; and Tomato  Home Medications   Prior to Admission medications   Medication Sig Start Date End Date Taking? Authorizing Provider  albuterol (PROAIR HFA) 108 (90 BASE) MCG/ACT inhaler Inhale 2 puffs into the lungs every 4 (four) hours as needed for wheezing. Persistent dry cough 09/21/13  Yes Alethea Johnathan Hausen, MD  albuterol (PROVENTIL) (2.5 MG/3ML) 0.083% nebulizer solution Inhale 3 mLs into the lungs every 6 (six) hours as needed. Shortness of breath 11/08/15  Yes Historical Provider, MD  atomoxetine (STRATTERA) 25 MG capsule Take 25 mg by mouth daily. Takes with the 100 mg to make 125 mg daily   Yes Historical Provider, MD  budesonide-formoterol (SYMBICORT) 160-4.5 MCG/ACT inhaler Inhale 2 puffs into the lungs 2 (two) times daily.   Yes Historical Provider, MD  divalproex (DEPAKOTE) 500 MG DR tablet Take 1 tablet (500 mg total) by mouth 2 (two) times daily. 07/03/15  Yes Penni Bombard, MD  EPINEPHrine 0.3 mg/0.3 mL IJ SOAJ injection Inject 0.3 mg into the muscle once.   Yes Historical Provider, MD  fluticasone (FLONASE) 50 MCG/ACT nasal spray Place 1 spray into both nostrils daily. Patient taking differently: Place 1 spray into both nostrils daily as needed for allergies.  11/13/14  Yes [name], NP  HYDROcodone-acetaminophen (NORCO/VICODIN) 5-325 MG tablet Take 1 tablet by mouth every 6 (six) hours as needed for moderate pain.  09/18/15  Yes Rolland Porter, MD  hydrocortisone (CORTENEMA) 100 MG/60ML enema Place 1 enema (100 mg total) rectally at bedtime. 11/25/15  Yes Erline Hau, MD  loratadine (CLARITIN) 10 MG tablet Take 10 mg by mouth at bedtime.    Yes Historical Provider, MD  mesalamine (APRISO) 0.375 G 24 hr capsule Take 4 capsules (1.5 g total) by mouth daily. 11/13/14  Yes [name], NP  mesalamine (CANASA) 1000 MG suppository Place 1 suppository (1,000 mg total) rectally at bedtime. 10/15/15  Yes Orvil Feil, NP  montelukast (SINGULAIR) 10 MG tablet Take 1 tablet (10 mg total) by mouth at bedtime. 11/13/14  Yes [name], NP  predniSONE (DELTASONE) 10 MG tablet Take 3 tablets (30 mg total) by mouth daily. 12/15/15  Yes Margette Fast, MD  ranitidine (ZANTAC) 150 MG tablet Take 1 tablet (150 mg total) by mouth daily. 11/13/14  Yes [name], NP  sertraline (ZOLOFT) 100 MG tablet Take 1 tablet (100 mg total) by mouth daily. 11/13/14  Yes [name], NP  STRATTERA 100  MG capsule take one capsule by mouth once daily 08/09/15  Yes Historical Provider, MD  traZODone (DESYREL) 100 MG tablet Take one tablet by mouth once daily at bedtime 08/07/15  Yes Historical Provider, MD   BP 135/85 mmHg  Pulse 86  Temp(Src) 98.9 F (37.2 C) (Oral)  Resp 15  Ht 5' 7"  (1.702 m)  Wt 105.915 kg  BMI 36.56 kg/m2  SpO2 98% Physical Exam CONSTITUTIONAL: Well developed/well nourished HEAD: Normocephalic/atraumatic EYES: EOMI/PERRL ENMT: Mucous membranes dry NECK: supple no meningeal signs SPINE/BACK:entire spine nontender CV: S1/S2 noted, no murmurs/rubs/gallops noted LUNGS: Lungs are clear to auscultation bilaterally, no apparent distress ABDOMEN: soft, mild periumbilical tenderness, no rebound or guarding, bowel sounds noted throughout abdomen GU:no cva tenderness NEURO: Pt is awake/alert/appropriate, moves all extremitiesx4.  No facial droop.   EXTREMITIES: pulses normal/equal, full ROM SKIN: warm, color  normal   ED Course  Procedures  3:09 PM Pt with h/o ulcerative colitis who presents with abd pain and diarrhea Seen in ED on 7/7, started on prednisone but pain/diarrhea worsened today Labs reassuring/unchanged He is dehydrated - he reports decreased UOP in past several days and only 4m on bladder scan IV fluids ordered 5:30 PM Pt improved He only had one episode of diarrhea, and that was prior to my evaluation No vomiting He rested comfortably He is using phone, well appearing/tolerated PO fluids Stable for d/c home Discussed return precautions  Labs Review Labs Reviewed  COMPREHENSIVE METABOLIC PANEL - Abnormal; Notable for the following:    Glucose, Bld 101 (*)    Calcium 8.8 (*)    ALT 16 (*)    All other components within normal limits  CBC - Abnormal; Notable for the following:    RBC 3.51 (*)    Hemoglobin 9.3 (*)    HCT 29.0 (*)    All other components within normal limits  LIPASE, BLOOD  URINALYSIS, ROUTINE W REFLEX MICROSCOPIC [facility]    I have personally reviewed and evaluated these lab results as part of my medical decision-making.  Medications  sodium chloride 0.9 % bolus 1,000 mL (0 mLs Intravenous Stopped 12/16/15 1603)  sodium chloride 0.9 % bolus 1,000 mL (1,000 mLs Intravenous New Bag/Given 12/16/15 1511)  ondansetron (ZOFRAN) injection 4 mg (4 mg Intravenous Given 12/16/15 1518)  morphine 4 MG/ML injection 4 mg (4 mg Intravenous Given 12/16/15 1518)    MDM   Final diagnoses:  Abdominal pain, unspecified abdominal location  Diarrhea, unspecified type    Nursing notes including past medical history and social history reviewed and considered in documentation Labs/vital reviewed myself and considered during evaluation Previous records reviewed and considered Narcotic database reviewed and considered in decision making     DRipley Fraise MD 12/16/15 1731

## 2015-12-16 NOTE — ED Notes (Signed)
Bladder scan performed: yielded 12m.

## 2015-12-16 NOTE — ED Notes (Signed)
Pt tolerating fluids.

## 2015-12-16 NOTE — ED Notes (Signed)
Patient c/o generalized abd pain. Denies any nausea, vomiting, or fevers. Patient does report diarrhea. Patient seen here in ER on Friday for abd pain, diagnosed with colitis, given prednisone, and was told to come back if no improvement.

## 2015-12-16 NOTE — Discharge Instructions (Signed)
°  SEEK IMMEDIATE MEDICAL ATTENTION IF: The pain does not go away or becomes severe, particularly over the next 8-12 hours.  A temperature above 100.17F develops.  Repeated vomiting occurs (multiple episodes).  The pain becomes localized to portions of the abdomen. The right side could possibly be appendicitis.  Return also if you develop chest pain, difficulty breathing, dizziness or fainting, or become confused, poorly responsive, or inconsolable.

## 2015-12-18 ENCOUNTER — Encounter: Payer: Self-pay | Admitting: Gastroenterology

## 2015-12-18 ENCOUNTER — Ambulatory Visit (INDEPENDENT_AMBULATORY_CARE_PROVIDER_SITE_OTHER): Payer: Medicaid Other | Admitting: Gastroenterology

## 2015-12-18 VITALS — BP 134/88 | HR 101 | Temp 99.0°F | Ht 67.5 in | Wt 232.8 lb

## 2015-12-18 DIAGNOSIS — K51011 Ulcerative (chronic) pancolitis with rectal bleeding: Secondary | ICD-10-CM | POA: Diagnosis not present

## 2015-12-18 MED ORDER — BUDESONIDE 2 MG/ACT RE FOAM: 1.0000 "application " | Freq: Two times a day (BID) | RECTAL | Status: DC

## 2015-12-18 MED ORDER — BUDESONIDE 2 MG/ACT RE FOAM: 1.0000 "application " | Freq: Every evening | RECTAL | Status: DC

## 2015-12-18 MED ORDER — HYDROCODONE-ACETAMINOPHEN 5-325 MG PO TABS: 1.0000 | ORAL_TABLET | Freq: Four times a day (QID) | ORAL | Status: DC | PRN

## 2015-12-18 NOTE — Patient Instructions (Addendum)
I will see if the Uceris rectal foam is covered by insurance. If not, we will try and see if we can get this approved. I feel you may tolerate this better. This will take the place of the hydrocortisone enema.   I have given you a short prescription for pain medication.  We are going to set you up for the Remicade infusions.   You will see Dr. [name] in 2 months.

## 2015-12-18 NOTE — Progress Notes (Signed)
Quick Note:  Pt has OV with Laban Emperor, NP today. ______

## 2015-12-18 NOTE — Telephone Encounter (Signed)
Pt was seen in office today

## 2015-12-18 NOTE — Telephone Encounter (Signed)
PLEASE CALL PT. HIS CXR IS NORMAL. PROCEED WITH REMICADE.

## 2015-12-18 NOTE — Progress Notes (Signed)
Referring Provider: Nolene Ebbs, MD Primary Care Physician:  [name], MD  Primary GI: Dr. [name]   Chief Complaint  Patient presents with  . Follow-up    Hospital follow up/ still c/o abd pain    HPI:   Wayne Avila is a 25 y.o. male presenting today with a history of UC. He had a recent colonoscopy due to persistent flares, and he was noted to have pancolitis (biopsies with chronic inactive colitis) and rectal biopsies with severe proctitis. He has been on Apriso historically but needs Remicade. His Hep B test was negative, TB test indeterminate. CXR NORMAL. He may start Remicade. He will need Remicade 65m/kg day 0, 2, and 6 weeks, then every 8 weeks. He will need an office visit in 2 months with Dr. FOneida Alar No pre-medications needed for Remicade per Dr. FOneida Alar  Returns today after being seen in the ED for abdominal pain and diarrhea. Imodium doesn't work. Diarrhea persistent. Difficulty retaining hydrocortisone enemas. Abdominal pain vaguely reported. Laying on exam table, which is normal for him. Mom present.    Past Medical History  Diagnosis Date  . Asthma   . ADHD (attention deficit hyperactivity disorder)   . [place] 1 [place]   . GERD (gastroesophageal reflux disease)   . Depression   . Anxiety   . Mental developmental delay 11/15/2012  . PONV (postoperative nausea and vomiting)   . Hearing loss   . HOH (hard of hearing)   . Abdominal pain, chronic, right upper quadrant   . Colitis   . Seizures ([facility]     Past Surgical History  Procedure Laterality Date  . Tonsillectomy    . Adenoidectomy    . Esophagogastroduodenoscopy (egd) with propofol N/A 12/07/2012    SLF:The mucosa of the esophagus appeared normal Non-erosive gastritis (inflammation) was found in the gastric antrum; multiple biopsies The duodenal mucosa showed no abnormalities in the bulb and second portion of the duodenum  . Biopsy N/A 12/07/2012    Procedure: GASTRIC BIOPSIES;  Surgeon:  SDanie Binder MD;  Location: [facility];  Service: Endoscopy;  Laterality: N/A;  . Colonoscopy with propofol N/A 08/30/2013    SPZW:CHENIDmucosa in the terminal ileum/COLITIS/ MILD PROCTITIS. 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  .Marland KitchenBiopsy N/A 08/30/2013    Procedure: BIOPSY;  Surgeon: SDanie Binder MD;  Location: [facility];  Service: Endoscopy;  Laterality: N/A;  right colon,transverse colon, left colon, rectal biopsies  . Esophagogastroduodenoscopy  11/20/2015    Dr. FOneida Alar Normal exam, stomach biopsied and duodenal biopsy.. Benign biopsies.  . Colonoscopy  11/20/2015    Dr. fOneida Alar Severe erythema, edema, ulcers from the anal verge to 20 cm above the anal verge without mucosal sparing, remaining colon and terminal ileum appeared normal. Biopsies from the rectum revealed fulminant active chronic colitis consistent with IBD. Pathology from terminal ileum revealed intramucosal lymphoid aggregates. Remaining colon random biopsies with inactive chronic colitis  . Colonoscopy with propofol N/A 11/20/2015    Dr. 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 Esophagogastroduodenoscopy (egd) with propofol N/A 11/20/2015    Dr. FOneida Alar normal with normal biopsies, negative H.pylori   . Biopsy  11/20/2015    Procedure: BIOPSY;  Surgeon: SDanie Binder MD;  Location: [facility];  Service: Endoscopy;;  ileal, right colon biopsy, left colon, rectum    Current Outpatient Prescriptions  Medication Sig Dispense Refill  . albuterol (PROAIR HFA) 108 (90 BASE) MCG/ACT inhaler Inhale 2 puffs into the lungs every 4 (four) hours as needed for wheezing. Persistent dry cough 6.7 g 2  . albuterol (PROVENTIL) (2.5 MG/3ML) 0.083% nebulizer solution Inhale 3 mLs into the lungs every 6 (six) hours as needed. Shortness of breath  11  . atomoxetine (STRATTERA) 25 MG capsule Take  25 mg by mouth daily. Takes with the 100 mg to make 125 mg daily    . divalproex (DEPAKOTE) 500 MG DR tablet Take 1 tablet (500 mg total) by mouth 2 (two) times daily. 60 tablet 2  . fluticasone (FLONASE) 50 MCG/ACT nasal spray Place 1 spray into both nostrils daily. (Patient taking differently: Place 1 spray into both nostrils daily as needed for allergies. )  5  . hydrocortisone (CORTENEMA) 100 MG/60ML enema Place 1 enema (100 mg total) rectally at bedtime. 30 enema 1  . loratadine (CLARITIN) 10 MG tablet Take 10 mg by mouth at bedtime.     . mesalamine (APRISO) 0.375 G 24 hr capsule Take 4 capsules (1.5 g total) by mouth daily. 124 capsule 11  . montelukast (SINGULAIR) 10 MG tablet Take 1 tablet (10 mg total) by mouth at bedtime. 30 tablet 3  . ranitidine (ZANTAC) 150 MG tablet Take 1 tablet (150 mg total) by mouth daily.    . sertraline (ZOLOFT) 100 MG tablet Take 1 tablet (100 mg total) by mouth daily. 30 tablet 0  . STRATTERA 100 MG capsule take one capsule by mouth once daily  5  . traZODone (DESYREL) 100 MG tablet Take one tablet by mouth once daily at bedtime  5  . budesonide-formoterol (SYMBICORT) 160-4.5 MCG/ACT inhaler Inhale 2 puffs into the lungs 2 (two) times daily.    Marland Kitchen EPINEPHrine 0.3 mg/0.3 mL IJ SOAJ injection Inject 0.3 mg into the muscle once. Reported on 12/18/2015    . mesalamine (CANASA) 1000 MG suppository Place 1 suppository (1,000 mg total) rectally at bedtime. (Patient not taking: Reported on 12/18/2015) 30 suppository 1  . predniSONE (DELTASONE) 10 MG tablet Take 3 tablets (30 mg total) by mouth daily. (Patient not taking: Reported on 12/18/2015) 10 tablet 0   No current facility-administered medications for this visit.    Allergies as of 12/18/2015 - Review Complete 12/18/2015  Allergen Reaction Noted  . Amoxicillin-pot clavulanate Nausea And Vomiting 02/28/2011  . Ibuprofen Other (See Comments) 07/03/2015  . Omeprazole Nausea And Vomiting 04/20/2013  . Pineapple  Swelling 04/14/2011  . Strawberry extract Swelling 05/08/2012  . Tomato Rash 04/14/2011    Family History  Problem Relation Age of Onset  . Asthma Mother   . Ulcers Mother   . Bipolar disorder Mother   . Colon cancer Neg Hx   . Liver disease Neg Hx   . ADD / ADHD Father   . Diabetes Maternal Grandmother   . Diabetes Maternal Grandfather     Social History   Social History  . Marital Status: Single    Spouse Name: N/A  . Number of Children: 0  . Years of Education: 12th   Occupational History  .      not working   Social History Main Topics  . Smoking status: Former Smoker -- 0.00 packs/day for 2 years    Quit date: 10/06/2012  . Smokeless tobacco: Never Used     Comment: Never smoked cigarettes  . Alcohol Use: No     Comment: denies usage  . Drug Use:  No  . Sexual Activity: Yes   Other Topics Concern  . None   Social History Narrative   Patient lives at home with girlfriend and family.   Caffeine Use: occasionally    Review of Systems: Negative unless mentioned in HPI   Physical Exam: BP 134/88 mmHg  Pulse 101  Temp(Src) 99 F (37.2 C) (Oral)  Ht 5' 7.5" (1.715 m)  Wt 232 lb 12.8 oz (105.597 kg)  BMI 35.90 kg/m2 General:   Alert and oriented. No distress noted. Pleasant and cooperative.  Head:  Normocephalic and atraumatic. Eyes:  Conjuctiva clear without scleral icterus. Abdomen:  +BS, soft, non-tender and non-distended. No rebound or guarding. No HSM or masses noted. Extremities:  Without edema. Neurologic:  Alert and  oriented x4;  grossly normal neurologically. Psych:  Alert and cooperative. Normal mood and affect.  Lab Results  Component Value Date   WBC 6.7 12/16/2015   HGB 9.3* 12/16/2015   HCT 29.0* 12/16/2015   MCV 82.6 12/16/2015   PLT 343 12/16/2015

## 2015-12-19 NOTE — Telephone Encounter (Signed)
Remicade orders faxed to Endo.

## 2015-12-20 NOTE — Progress Notes (Signed)
cc'ed to pcp °

## 2015-12-20 NOTE — Assessment & Plan Note (Signed)
25 year old male with pancolitis and severe proctitis, no improvement on Apriso. Hep B test negative, TB test indeterminate, CXR normal. May now start Remicade. Orders written for Remcade 53m/kg day 0, 2 and 6 weeks, then every 8 weeks. No pre-medication. As he is unable to the hydrocortisone enemas, I have sent in Uceris foam in the hopes we can treat with topical therapy in the interim. Start Remicade as soon as possible. Return to see Dr. FOneida Alarin 2 months. Discussed hydration with patient. Stated understanding.

## 2015-12-25 ENCOUNTER — Emergency Department (HOSPITAL_COMMUNITY)
Admission: EM | Admit: 2015-12-25 | Discharge: 2015-12-25 | Disposition: A | Discharge status: Home / Self Care | Payer: Medicaid Other | Attending: Emergency Medicine | Admitting: Emergency Medicine

## 2015-12-25 ENCOUNTER — Encounter (HOSPITAL_COMMUNITY): Payer: Self-pay | Admitting: Emergency Medicine

## 2015-12-25 ENCOUNTER — Emergency Department (HOSPITAL_COMMUNITY): Payer: Medicaid Other

## 2015-12-25 DIAGNOSIS — J45909 Unspecified asthma, uncomplicated: Secondary | ICD-10-CM | POA: Diagnosis not present

## 2015-12-25 DIAGNOSIS — Z79899 Other long term (current) drug therapy: Secondary | ICD-10-CM | POA: Insufficient documentation

## 2015-12-25 DIAGNOSIS — F909 Attention-deficit hyperactivity disorder, unspecified type: Secondary | ICD-10-CM | POA: Diagnosis not present

## 2015-12-25 DIAGNOSIS — F329 Major depressive disorder, single episode, unspecified: Secondary | ICD-10-CM | POA: Insufficient documentation

## 2015-12-25 DIAGNOSIS — R1110 Vomiting, unspecified: Secondary | ICD-10-CM

## 2015-12-25 DIAGNOSIS — R109 Unspecified abdominal pain: Secondary | ICD-10-CM

## 2015-12-25 DIAGNOSIS — R1084 Generalized abdominal pain: Secondary | ICD-10-CM | POA: Insufficient documentation

## 2015-12-25 DIAGNOSIS — Z87891 Personal history of nicotine dependence: Secondary | ICD-10-CM | POA: Insufficient documentation

## 2015-12-25 LAB — COMPREHENSIVE METABOLIC PANEL
ALT: 17 U/L (ref 17–63)
AST: 20 U/L (ref 15–41)
Albumin: 3.7 g/dL (ref 3.5–5.0)
Alkaline Phosphatase: 51 U/L (ref 38–126)
Anion gap: 7 (ref 5–15)
BUN: 11 mg/dL (ref 6–20)
CO2: 24 mmol/L (ref 22–32)
Calcium: 8.5 mg/dL — ABNORMAL LOW (ref 8.9–10.3)
Chloride: 106 mmol/L (ref 101–111)
Creatinine, Ser: 0.96 mg/dL (ref 0.61–1.24)
GFR calc Af Amer: >60 mL/min (ref >60)
GFR calc non Af Amer: >60 mL/min (ref >60)
Glucose, Bld: 123 mg/dL — ABNORMAL HIGH (ref 65–99)
Potassium: 3.8 mmol/L (ref 3.5–5.1)
Sodium: 137 mmol/L (ref 135–145)
Total Bilirubin: 0.2 mg/dL — ABNORMAL LOW (ref 0.3–1.2)
Total Protein: 7 g/dL (ref 6.5–8.1)

## 2015-12-25 LAB — CBC WITH DIFFERENTIAL/PLATELET
Basophils Absolute: 0 10*3/uL (ref 0.0–0.1)
Basophils Relative: 1 %
Eosinophils Absolute: 0.9 10*3/uL — ABNORMAL HIGH (ref 0.0–0.7)
Eosinophils Relative: 16 %
HCT: 28.1 % — ABNORMAL LOW (ref 39.0–52.0)
Hemoglobin: 9.4 g/dL — ABNORMAL LOW (ref 13.0–17.0)
Lymphocytes Relative: 35 %
Lymphs Abs: 2 10*3/uL (ref 0.7–4.0)
MCH: 26.5 pg (ref 26.0–34.0)
MCHC: 33.5 g/dL (ref 30.0–36.0)
MCV: 79.2 fL (ref 78.0–100.0)
Monocytes Absolute: 0.8 10*3/uL (ref 0.1–1.0)
Monocytes Relative: 13 %
Neutro Abs: 2 10*3/uL (ref 1.7–7.7)
Neutrophils Relative %: 35 %
Platelets: 307 10*3/uL (ref 150–400)
RBC: 3.55 MIL/uL — ABNORMAL LOW (ref 4.22–5.81)
RDW: 12.7 % (ref 11.5–15.5)
WBC: 5.7 10*3/uL (ref 4.0–10.5)

## 2015-12-25 LAB — LIPASE, BLOOD: Lipase: 31 U/L (ref 11–51)

## 2015-12-25 MED ORDER — MORPHINE SULFATE (PF) 4 MG/ML IV SOLN
6.0000 mg | Freq: Once | INTRAVENOUS | Status: AC
  Administered 2015-12-25: 6 mg via INTRAVENOUS
  Filled 2015-12-25: qty 2

## 2015-12-25 MED ORDER — ONDANSETRON HCL 4 MG/2ML IJ SOLN
4.0000 mg | Freq: Once | INTRAMUSCULAR | Status: AC
  Administered 2015-12-25: 4 mg via INTRAVENOUS
  Filled 2015-12-25: qty 2

## 2015-12-25 MED ORDER — OXYCODONE-ACETAMINOPHEN 5-325 MG PO TABS
1.0000 | ORAL_TABLET | Freq: Once | ORAL | Status: AC
  Administered 2015-12-25: 1 via ORAL
  Filled 2015-12-25: qty 1

## 2015-12-25 MED ORDER — ONDANSETRON 8 MG PO TBDP: 8.0000 mg | ORAL_TABLET | Freq: Three times a day (TID) | ORAL | Status: DC | PRN

## 2015-12-25 MED ORDER — SODIUM CHLORIDE 0.9 % IV SOLN
1000.0000 mL | Freq: Once | INTRAVENOUS | Status: AC
  Administered 2015-12-25: 1000 mL via INTRAVENOUS

## 2015-12-25 MED ORDER — SODIUM CHLORIDE 0.9 % IV SOLN
1000.0000 mL | INTRAVENOUS | Status: DC
  Administered 2015-12-25: 1000 mL via INTRAVENOUS

## 2015-12-25 NOTE — ED Notes (Signed)
Pt c/o increased abd pain with vomiting that started tonight. Pt is supposed to start treatment for colitis per mother this week.

## 2015-12-25 NOTE — Discharge Instructions (Signed)
*  Please call your gastroenterology team later this morning for close follow-up.   *Please return to the emergency department for new or worsening symptoms.    *Continue to keep yourself hydrated at home by drinking lots of fluids.    *You will benefit from frequent small meals rather than several large meals.    *Please increase the fiber in your diet

## 2015-12-25 NOTE — ED Provider Notes (Signed)
CSN: [id number]     Arrival date & time 12/25/15  0308 History   First MD Initiated Contact with Patient 12/25/15 [phone]     Chief Complaint  Patient presents with  . Abdominal Pain      HPI Patient presents to the emergency department with complaints of generalized abdominal pain and vomiting which began tonight.  He has a history of ulcerative colitis and is followed by the gastroenterology team closely here in [place].  He is preparing to begin Remicade infusions as he's had recurrent symptoms which do not seem to be responding to mesalamine.  His mother reports that he always has abdominal pain in his abdominal pain is similar to his baseline abdominal discomfort with his pancolitis.  She reports of vomiting was something new that he has not been dealing with.  He continues to have loose stools per the patient per the mother.  No reports of blood in the stool.  No lower abdominal discomfort.  No fevers or chills.  No hematemesis described   Past Medical History  Diagnosis Date  . Asthma   . ADHD (attention deficit hyperactivity disorder)   . Bipolar 1 disorder ([place])   . GERD (gastroesophageal reflux disease)   . Depression   . Anxiety   . Mental developmental delay 11/15/2012  . PONV (postoperative nausea and vomiting)   . Hearing loss   . HOH (hard of hearing)   . Abdominal pain, chronic, right upper quadrant   . Colitis   . Seizures [facility])    Past Surgical History  Procedure Laterality Date  . Tonsillectomy    . Adenoidectomy    . Esophagogastroduodenoscopy (egd) with propofol N/A 12/07/2012    SLF:The mucosa of the esophagus appeared normal Non-erosive gastritis (inflammation) was found in the gastric antrum; multiple biopsies The duodenal mucosa showed no abnormalities in the bulb and second portion of the duodenum  . Biopsy N/A 12/07/2012    Procedure: GASTRIC BIOPSIES;  Surgeon: [name], MD;  Location: [facility] ORS;  Service: Endoscopy;  Laterality: N/A;  . Colonoscopy with  propofol N/A 08/30/2013    VFI:EPPIRJ mucosa in the terminal ileum/COLITIS/ MILD PROCTITIS. 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  Marland Kitchen Biopsy N/A 08/30/2013    Procedure: BIOPSY;  Surgeon: [name], MD;  Location: [facility];  Service: Endoscopy;  Laterality: N/A;  right colon,transverse colon, left colon, rectal biopsies  . Esophagogastroduodenoscopy  11/20/2015    Dr. [name]: Normal exam, stomach biopsied and duodenal biopsy.. Benign biopsies.  . Colonoscopy  11/20/2015    Dr. [name]: Severe erythema, edema, ulcers from the anal verge to 20 cm above the anal verge without mucosal sparing, remaining colon and terminal ileum appeared normal. Biopsies from the rectum revealed fulminant active chronic colitis consistent with IBD. Pathology from terminal ileum revealed intramucosal lymphoid aggregates. Remaining colon random biopsies with inactive chronic colitis  . Colonoscopy with propofol N/A 11/20/2015    Dr. 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 Esophagogastroduodenoscopy (egd) with propofol N/A 11/20/2015    Dr. [name]: normal with normal biopsies, negative H.pylori   . Biopsy  11/20/2015    Procedure: BIOPSY;  Surgeon: [name], MD;  Location: [facility] END[facility] SUITE;  Service: Endoscopy;;  ileal, right colon biopsy, left colon, rectum   Family History  Problem Relation Age of Onset  . Asthma Mother   . Ulcers Mother   .  Bipolar disorder Mother   . Colon cancer Neg Hx   . Liver disease Neg Hx   . ADD / ADHD Father   . Diabetes Maternal Grandmother   . Diabetes Maternal Grandfather    Social History  Substance Use Topics  . Smoking status: Former Smoker -- 0.00 packs/day for 2 years    Quit date: 10/06/2012  . Smokeless tobacco: Never Used     Comment: Never smoked cigarettes  . Alcohol Use: No     Comment: denies usage    Review of  Systems  All other systems reviewed and are negative.     Allergies  Amoxicillin-pot clavulanate; Ibuprofen; Omeprazole; Pineapple; Strawberry extract; and Tomato  Home Medications   Prior to Admission medications   Medication Sig Start Date End Date Taking? Authorizing Provider  albuterol (PROAIR HFA) 108 (90 BASE) MCG/ACT inhaler Inhale 2 puffs into the lungs every 4 (four) hours as needed for wheezing. Persistent dry cough 09/21/13   Sandi Mealy, MD  albuterol (PROVENTIL) (2.5 MG/3ML) 0.083% nebulizer solution Inhale 3 mLs into the lungs every 6 (six) hours as needed. Shortness of breath 11/08/15   Historical Provider, MD  atomoxetine (STRATTERA) 25 MG capsule Take 25 mg by mouth daily. Takes with the 100 mg to make 125 mg daily    Historical Provider, MD  Budesonide (UCERIS) 2 MG/ACT FOAM Place 1 application rectally 2 (two) times daily. For 2 weeks then every evening for 4 weeks 12/18/15   Orvil Feil, NP  budesonide-formoterol [facility]) 160-4.5 MCG/ACT inhaler Inhale 2 puffs into the lungs 2 (two) times daily.    Historical Provider, MD  divalproex (DEPAKOTE) 500 MG DR tablet Take 1 tablet (500 mg total) by mouth 2 (two) times daily. 07/03/15   Penni Bombard, MD  EPINEPHrine 0.3 mg/0.3 mL IJ SOAJ injection Inject 0.3 mg into the muscle once. Reported on 12/18/2015    Historical Provider, MD  fluticasone (FLONASE) 50 MCG/ACT nasal spray Place 1 spray into both nostrils daily. Patient taking differently: Place 1 spray into both nostrils daily as needed for allergies.  11/13/14   [name], NP  HYDROcodone-acetaminophen (NORCO) 5-325 MG tablet Take 1 tablet by mouth every 6 (six) hours as needed for moderate pain. 12/18/15   Orvil Feil, NP  hydrocortisone (CORTENEMA) 100 MG/60ML enema Place 1 enema (100 mg total) rectally at bedtime. 11/25/15   Erline Hau, MD  loratadine (CLARITIN) 10 MG tablet Take 10 mg by mouth at bedtime.     Historical Provider, MD  mesalamine  (APRISO) 0.375 G 24 hr capsule Take 4 capsules (1.5 g total) by mouth daily. 11/13/14   [name], NP  montelukast (SINGULAIR) 10 MG tablet Take 1 tablet (10 mg total) by mouth at bedtime. 11/13/14   [name], NP  ondansetron (ZOFRAN ODT) 8 MG disintegrating tablet Take 1 tablet (8 mg total) by mouth every 8 (eight) hours as needed for nausea or vomiting. 12/25/15   Jola Schmidt, MD  predniSONE (DELTASONE) 10 MG tablet Take 3 tablets (30 mg total) by mouth daily. Patient not taking: Reported on 12/18/2015 12/15/15   Margette Fast, MD  ranitidine (ZANTAC) 150 MG tablet Take 1 tablet (150 mg total) by mouth daily. 11/13/14   [name], NP  sertraline (ZOLOFT) 100 MG tablet Take 1 tablet (100 mg total) by mouth daily. 11/13/14   [name], NP  STRATTERA 100 MG capsule take one capsule by mouth once  daily 08/09/15   Historical Provider, MD  traZODone (DESYREL) 100 MG tablet Take one tablet by mouth once daily at bedtime 08/07/15   Historical Provider, MD   BP 130/80 mmHg  Pulse 102  Temp(Src) 99.5 F (37.5 C)  Resp 20  Ht 5' 7"  (1.702 m)  Wt 232 lb (105.235 kg)  BMI 36.33 kg/m2  SpO2 100% Physical Exam  Constitutional: He is oriented to person, place, and time. He appears well-developed and well-nourished.  HENT:  Head: Normocephalic and atraumatic.  Eyes: EOM are normal.  Neck: Normal range of motion.  Cardiovascular: Normal rate, regular rhythm, normal heart sounds and intact distal pulses.   Pulmonary/Chest: Effort normal and breath sounds normal. No respiratory distress.  Abdominal: Soft. He exhibits no distension.  Mild generalized tenderness without rebound or guarding.  No peritoneal signs  Musculoskeletal: Normal range of motion.  Neurological: He is alert and oriented to person, place, and time.  Skin: Skin is warm and dry.  Psychiatric: He has a normal mood and affect. Judgment normal.  Nursing note and vitals reviewed.   ED Course  Procedures (including critical care  time) Labs Review Labs Reviewed  COMPREHENSIVE METABOLIC PANEL - Abnormal; Notable for the following:    Glucose, Bld 123 (*)    Calcium 8.5 (*)    Total Bilirubin 0.2 (*)    All other components within normal limits  CBC WITH DIFFERENTIAL/PLATELET - Abnormal; Notable for the following:    RBC 3.55 (*)    Hemoglobin 9.4 (*)    HCT 28.1 (*)    Eosinophils Absolute 0.9 (*)    All other components within normal limits  LIPASE, BLOOD   HEMOGLOBIN  Date Value Ref Range Status  12/25/2015 9.4* 13.0 - 17.0 g/dL Final  12/16/2015 9.3* 13.0 - 17.0 g/dL Final  12/14/2015 9.3* 13.0 - 17.0 g/dL Final  11/24/2015 9.5* 13.0 - 17.0 g/dL Final     Imaging Review Dg Abd 2 Views  12/25/2015  CLINICAL DATA:  Increasing abdominal pain EXAM: ABDOMEN - 2 VIEW COMPARISON:  Or 11/21/2015 CT FINDINGS: Moderate volume of formed stool from the ascending segment to the descending segment. No impaction or obstruction. Negative for pneumatosis or pneumoperitoneum. No concerning abdominal mass effect or calcification. Clear lung bases. IMPRESSION: Possible constipation.  No obstruction or impaction. Electronically Signed   By: Monte Fantasia M.D.   On: 12/25/2015 04:16   I have personally reviewed and evaluated these images and lab results as part of my medical decision-making.   EKG Interpretation None      MDM   Final diagnoses:  Vomiting  Abdominal pain, unspecified abdominal location    6:35 AM Patient is hydrated in the emergency department.  Laboratory studies without significant abnormalities.  Baseline anemia noted.  Symptomatic control.  Feeling better.  Primary care and outpatient GI follow-up.  I recommended he call his GI team later this morning for close follow-up.  I did not think the patient needs additional testing or admission the hospital this time  Medications  0.9 %  sodium chloride infusion (0 mLs Intravenous Stopped 12/25/15 0532)    Followed by  0.9 %  sodium chloride  infusion (0 mLs Intravenous Stopped 12/25/15 0629)  ondansetron (ZOFRAN) injection 4 mg (4 mg Intravenous Given 12/25/15 0350)  morphine 4 MG/ML injection 6 mg (6 mg Intravenous Given 12/25/15 0350)  oxyCODONE-acetaminophen (PERCOCET/ROXICET) 5-325 MG per tablet 1 tablet (1 tablet Oral Given 12/25/15 0530)       Jola Schmidt,  MD 12/25/15 [phone]

## 2015-12-27 ENCOUNTER — Encounter (HOSPITAL_COMMUNITY)
Admission: RE | Admit: 2015-12-27 | Discharge: 2015-12-27 | Disposition: A | Discharge status: Home / Self Care | Payer: Medicaid Other | Source: Ambulatory Visit | Attending: Gastroenterology | Admitting: Gastroenterology

## 2015-12-27 DIAGNOSIS — K5190 Ulcerative colitis, unspecified, without complications: Secondary | ICD-10-CM | POA: Diagnosis present

## 2015-12-27 MED ORDER — SODIUM CHLORIDE 0.9 % IV SOLN
INTRAVENOUS | Status: DC
  Administered 2015-12-27: 11:00:00 via INTRAVENOUS

## 2015-12-27 MED ORDER — SODIUM CHLORIDE 0.9 % IV SOLN
5.0000 mg/kg | Freq: Once | INTRAVENOUS | Status: AC
  Administered 2015-12-27: 500 mg via INTRAVENOUS
  Filled 2015-12-27: qty 50

## 2015-12-28 ENCOUNTER — Inpatient Hospital Stay (HOSPITAL_COMMUNITY)
Admission: EM | Admit: 2015-12-28 | Discharge: 2015-12-31 | DRG: 386 | Disposition: A | Discharge status: Home / Self Care | Payer: Medicaid Other | Attending: Internal Medicine | Admitting: Internal Medicine

## 2015-12-28 ENCOUNTER — Encounter (HOSPITAL_COMMUNITY): Payer: Self-pay | Admitting: *Deleted

## 2015-12-28 DIAGNOSIS — D649 Anemia, unspecified: Secondary | ICD-10-CM

## 2015-12-28 DIAGNOSIS — J45909 Unspecified asthma, uncomplicated: Secondary | ICD-10-CM | POA: Diagnosis present

## 2015-12-28 DIAGNOSIS — F3112 Bipolar disorder, current episode manic without psychotic features, moderate: Secondary | ICD-10-CM | POA: Diagnosis present

## 2015-12-28 DIAGNOSIS — K5190 Ulcerative colitis, unspecified, without complications: Secondary | ICD-10-CM | POA: Diagnosis present

## 2015-12-28 DIAGNOSIS — Z833 Family history of diabetes mellitus: Secondary | ICD-10-CM

## 2015-12-28 DIAGNOSIS — K51011 Ulcerative (chronic) pancolitis with rectal bleeding: Secondary | ICD-10-CM

## 2015-12-28 DIAGNOSIS — R1010 Upper abdominal pain, unspecified: Secondary | ICD-10-CM | POA: Diagnosis present

## 2015-12-28 DIAGNOSIS — K529 Noninfective gastroenteritis and colitis, unspecified: Secondary | ICD-10-CM | POA: Diagnosis present

## 2015-12-28 DIAGNOSIS — Z7951 Long term (current) use of inhaled steroids: Secondary | ICD-10-CM

## 2015-12-28 DIAGNOSIS — F909 Attention-deficit hyperactivity disorder, unspecified type: Secondary | ICD-10-CM | POA: Diagnosis present

## 2015-12-28 DIAGNOSIS — K219 Gastro-esophageal reflux disease without esophagitis: Secondary | ICD-10-CM | POA: Diagnosis present

## 2015-12-28 DIAGNOSIS — Z818 Family history of other mental and behavioral disorders: Secondary | ICD-10-CM

## 2015-12-28 DIAGNOSIS — Z825 Family history of asthma and other chronic lower respiratory diseases: Secondary | ICD-10-CM

## 2015-12-28 DIAGNOSIS — K51919 Ulcerative colitis, unspecified with unspecified complications: Secondary | ICD-10-CM | POA: Diagnosis present

## 2015-12-28 DIAGNOSIS — F319 Bipolar disorder, unspecified: Secondary | ICD-10-CM | POA: Diagnosis present

## 2015-12-28 DIAGNOSIS — H9190 Unspecified hearing loss, unspecified ear: Secondary | ICD-10-CM | POA: Diagnosis present

## 2015-12-28 DIAGNOSIS — Z6835 Body mass index (BMI) 35.0-35.9, adult: Secondary | ICD-10-CM

## 2015-12-28 DIAGNOSIS — K625 Hemorrhage of anus and rectum: Secondary | ICD-10-CM | POA: Diagnosis present

## 2015-12-28 DIAGNOSIS — Z87891 Personal history of nicotine dependence: Secondary | ICD-10-CM

## 2015-12-28 DIAGNOSIS — D62 Acute posthemorrhagic anemia: Secondary | ICD-10-CM | POA: Diagnosis present

## 2015-12-28 DIAGNOSIS — D509 Iron deficiency anemia, unspecified: Secondary | ICD-10-CM | POA: Diagnosis present

## 2015-12-28 DIAGNOSIS — R1084 Generalized abdominal pain: Secondary | ICD-10-CM

## 2015-12-28 DIAGNOSIS — R197 Diarrhea, unspecified: Secondary | ICD-10-CM

## 2015-12-28 DIAGNOSIS — R6250 Unspecified lack of expected normal physiological development in childhood: Secondary | ICD-10-CM | POA: Diagnosis present

## 2015-12-28 DIAGNOSIS — E669 Obesity, unspecified: Secondary | ICD-10-CM | POA: Diagnosis present

## 2015-12-28 DIAGNOSIS — K51911 Ulcerative colitis, unspecified with rectal bleeding: Principal | ICD-10-CM | POA: Diagnosis present

## 2015-12-28 NOTE — ED Notes (Addendum)
Pt reports severe abdominal pain. Has a hx of ulcerative colitis. Pt states he is having diarrhea with light red blood. Pt denies n/v. Pt started remicade yesterday.

## 2015-12-29 DIAGNOSIS — F3112 Bipolar disorder, current episode manic without psychotic features, moderate: Secondary | ICD-10-CM | POA: Diagnosis not present

## 2015-12-29 DIAGNOSIS — K51911 Ulcerative colitis, unspecified with rectal bleeding: Secondary | ICD-10-CM | POA: Diagnosis not present

## 2015-12-29 DIAGNOSIS — Z833 Family history of diabetes mellitus: Secondary | ICD-10-CM | POA: Diagnosis not present

## 2015-12-29 DIAGNOSIS — K625 Hemorrhage of anus and rectum: Secondary | ICD-10-CM

## 2015-12-29 DIAGNOSIS — K5190 Ulcerative colitis, unspecified, without complications: Secondary | ICD-10-CM

## 2015-12-29 DIAGNOSIS — Z818 Family history of other mental and behavioral disorders: Secondary | ICD-10-CM | POA: Diagnosis not present

## 2015-12-29 DIAGNOSIS — K529 Noninfective gastroenteritis and colitis, unspecified: Secondary | ICD-10-CM | POA: Diagnosis not present

## 2015-12-29 DIAGNOSIS — F909 Attention-deficit hyperactivity disorder, unspecified type: Secondary | ICD-10-CM | POA: Diagnosis present

## 2015-12-29 DIAGNOSIS — K5100 Ulcerative (chronic) pancolitis without complications: Secondary | ICD-10-CM | POA: Diagnosis not present

## 2015-12-29 DIAGNOSIS — Z87891 Personal history of nicotine dependence: Secondary | ICD-10-CM | POA: Diagnosis not present

## 2015-12-29 DIAGNOSIS — D62 Acute posthemorrhagic anemia: Secondary | ICD-10-CM | POA: Diagnosis present

## 2015-12-29 DIAGNOSIS — Z825 Family history of asthma and other chronic lower respiratory diseases: Secondary | ICD-10-CM | POA: Diagnosis not present

## 2015-12-29 DIAGNOSIS — K51919 Ulcerative colitis, unspecified with unspecified complications: Secondary | ICD-10-CM | POA: Diagnosis present

## 2015-12-29 DIAGNOSIS — R6250 Unspecified lack of expected normal physiological development in childhood: Secondary | ICD-10-CM | POA: Diagnosis present

## 2015-12-29 DIAGNOSIS — K219 Gastro-esophageal reflux disease without esophagitis: Secondary | ICD-10-CM | POA: Diagnosis present

## 2015-12-29 DIAGNOSIS — D509 Iron deficiency anemia, unspecified: Secondary | ICD-10-CM | POA: Diagnosis present

## 2015-12-29 DIAGNOSIS — Z6835 Body mass index (BMI) 35.0-35.9, adult: Secondary | ICD-10-CM | POA: Diagnosis not present

## 2015-12-29 DIAGNOSIS — H9190 Unspecified hearing loss, unspecified ear: Secondary | ICD-10-CM | POA: Diagnosis present

## 2015-12-29 DIAGNOSIS — J45909 Unspecified asthma, uncomplicated: Secondary | ICD-10-CM | POA: Diagnosis present

## 2015-12-29 DIAGNOSIS — R1010 Upper abdominal pain, unspecified: Secondary | ICD-10-CM

## 2015-12-29 DIAGNOSIS — K921 Melena: Secondary | ICD-10-CM | POA: Diagnosis not present

## 2015-12-29 DIAGNOSIS — Z7951 Long term (current) use of inhaled steroids: Secondary | ICD-10-CM | POA: Diagnosis not present

## 2015-12-29 DIAGNOSIS — E669 Obesity, unspecified: Secondary | ICD-10-CM | POA: Diagnosis present

## 2015-12-29 DIAGNOSIS — F319 Bipolar disorder, unspecified: Secondary | ICD-10-CM | POA: Diagnosis present

## 2015-12-29 LAB — CBC WITH DIFFERENTIAL/PLATELET
Basophils Absolute: 0 10*3/uL (ref 0.0–0.1)
Basophils Relative: 0 %
Eosinophils Absolute: 0.6 10*3/uL (ref 0.0–0.7)
Eosinophils Relative: 10 %
HCT: 26.5 % — ABNORMAL LOW (ref 39.0–52.0)
Hemoglobin: 8.8 g/dL — ABNORMAL LOW (ref 13.0–17.0)
Lymphocytes Relative: 31 %
Lymphs Abs: 1.8 10*3/uL (ref 0.7–4.0)
MCH: 26 pg (ref 26.0–34.0)
MCHC: 33.2 g/dL (ref 30.0–36.0)
MCV: 78.2 fL (ref 78.0–100.0)
Monocytes Absolute: 0.5 10*3/uL (ref 0.1–1.0)
Monocytes Relative: 8 %
Neutro Abs: 2.9 10*3/uL (ref 1.7–7.7)
Neutrophils Relative %: 51 %
Platelets: 295 10*3/uL (ref 150–400)
RBC: 3.39 MIL/uL — ABNORMAL LOW (ref 4.22–5.81)
RDW: 12.7 % (ref 11.5–15.5)
WBC: 5.7 10*3/uL (ref 4.0–10.5)

## 2015-12-29 LAB — COMPREHENSIVE METABOLIC PANEL
ALT: 16 U/L — ABNORMAL LOW (ref 17–63)
AST: 19 U/L (ref 15–41)
Albumin: 3.4 g/dL — ABNORMAL LOW (ref 3.5–5.0)
Alkaline Phosphatase: 45 U/L (ref 38–126)
Anion gap: 3 — ABNORMAL LOW (ref 5–15)
BUN: 9 mg/dL (ref 6–20)
CO2: 24 mmol/L (ref 22–32)
Calcium: 8.8 mg/dL — ABNORMAL LOW (ref 8.9–10.3)
Chloride: 109 mmol/L (ref 101–111)
Creatinine, Ser: 0.83 mg/dL (ref 0.61–1.24)
GFR calc Af Amer: >60 mL/min (ref >60)
GFR calc non Af Amer: >60 mL/min (ref >60)
Glucose, Bld: 91 mg/dL (ref 65–99)
Potassium: 4 mmol/L (ref 3.5–5.1)
Sodium: 136 mmol/L (ref 135–145)
Total Bilirubin: 0 mg/dL — ABNORMAL LOW (ref 0.3–1.2)
Total Protein: 6.5 g/dL (ref 6.5–8.1)

## 2015-12-29 LAB — TSH: TSH: 1.362 u[IU]/mL (ref 0.350–4.500)

## 2015-12-29 LAB — URINALYSIS, ROUTINE W REFLEX MICROSCOPIC
Bilirubin Urine: NEGATIVE
Glucose, UA: NEGATIVE mg/dL
Hgb urine dipstick: NEGATIVE
Leukocytes, UA: NEGATIVE
Nitrite: NEGATIVE
Protein, ur: NEGATIVE mg/dL
Specific Gravity, Urine: 1.02 (ref 1.005–1.030)
pH: 7 (ref 5.0–8.0)

## 2015-12-29 LAB — VITAMIN B12: Vitamin B-12: 312 pg/mL (ref 180–914)

## 2015-12-29 LAB — RETICULOCYTES
RBC.: 3.14 MIL/uL — ABNORMAL LOW (ref 4.22–5.81)
Retic Count, Absolute: 47.1 10*3/uL (ref 19.0–186.0)
Retic Ct Pct: 1.5 % (ref 0.4–3.1)

## 2015-12-29 LAB — CBC
HCT: 24.9 % — ABNORMAL LOW (ref 39.0–52.0)
Hemoglobin: 8.2 g/dL — ABNORMAL LOW (ref 13.0–17.0)
MCH: 26 pg (ref 26.0–34.0)
MCHC: 32.9 g/dL (ref 30.0–36.0)
MCV: 79 fL (ref 78.0–100.0)
Platelets: 273 10*3/uL (ref 150–400)
RBC: 3.15 MIL/uL — ABNORMAL LOW (ref 4.22–5.81)
RDW: 12.7 % (ref 11.5–15.5)
WBC: 6.4 10*3/uL (ref 4.0–10.5)

## 2015-12-29 LAB — BASIC METABOLIC PANEL
Anion gap: 5 (ref 5–15)
BUN: 8 mg/dL (ref 6–20)
CO2: 25 mmol/L (ref 22–32)
Calcium: 8.3 mg/dL — ABNORMAL LOW (ref 8.9–10.3)
Chloride: 107 mmol/L (ref 101–111)
Creatinine, Ser: 0.78 mg/dL (ref 0.61–1.24)
GFR calc Af Amer: >60 mL/min (ref >60)
GFR calc non Af Amer: >60 mL/min (ref >60)
Glucose, Bld: 99 mg/dL (ref 65–99)
Potassium: 4 mmol/L (ref 3.5–5.1)
Sodium: 137 mmol/L (ref 135–145)

## 2015-12-29 LAB — FERRITIN: Ferritin: 5 ng/mL — ABNORMAL LOW (ref 24–336)

## 2015-12-29 LAB — LIPASE, BLOOD: Lipase: 32 U/L (ref 11–51)

## 2015-12-29 LAB — TYPE AND SCREEN
ABO/RH(D): O POS
Antibody Screen: NEGATIVE

## 2015-12-29 LAB — FOLATE: Folate: 6.6 ng/mL (ref >5.9)

## 2015-12-29 LAB — IRON AND TIBC
Iron: 26 ug/dL — ABNORMAL LOW (ref 45–182)
Saturation Ratios: 6 % — ABNORMAL LOW (ref 17.9–39.5)
TIBC: 462 ug/dL — ABNORMAL HIGH (ref 250–450)
UIBC: 436 ug/dL

## 2015-12-29 MED ORDER — METHYLPREDNISOLONE SODIUM SUCC 40 MG IJ SOLR
40.0000 mg | Freq: Four times a day (QID) | INTRAMUSCULAR | Status: DC
  Administered 2015-12-29 (×2): 40 mg via INTRAVENOUS
  Filled 2015-12-29 (×2): qty 1

## 2015-12-29 MED ORDER — CIPROFLOXACIN IN D5W 400 MG/200ML IV SOLN
400.0000 mg | Freq: Two times a day (BID) | INTRAVENOUS | Status: DC
  Administered 2015-12-29 – 2015-12-31 (×5): 400 mg via INTRAVENOUS
  Filled 2015-12-29 (×5): qty 200

## 2015-12-29 MED ORDER — METRONIDAZOLE IN NACL 5-0.79 MG/ML-% IV SOLN
500.0000 mg | Freq: Three times a day (TID) | INTRAVENOUS | Status: DC
  Administered 2015-12-29 – 2015-12-31 (×7): 500 mg via INTRAVENOUS
  Filled 2015-12-29 (×7): qty 100

## 2015-12-29 MED ORDER — ATOMOXETINE HCL 40 MG PO CAPS
120.0000 mg | ORAL_CAPSULE | Freq: Every day | ORAL | Status: DC
  Administered 2015-12-29 – 2015-12-31 (×3): 120 mg via ORAL
  Filled 2015-12-29: qty 3
  Filled 2015-12-29 (×3): qty 2
  Filled 2015-12-29: qty 3

## 2015-12-29 MED ORDER — ONDANSETRON HCL 4 MG/2ML IJ SOLN: 4.0000 mg | Freq: Four times a day (QID) | INTRAMUSCULAR | Status: DC | PRN

## 2015-12-29 MED ORDER — ONDANSETRON HCL 4 MG PO TABS: 4.0000 mg | ORAL_TABLET | Freq: Four times a day (QID) | ORAL | Status: DC | PRN

## 2015-12-29 MED ORDER — KCL IN DEXTROSE-NACL 20-5-0.9 MEQ/L-%-% IV SOLN
INTRAVENOUS | Status: DC
  Administered 2015-12-29 – 2015-12-30 (×3): via INTRAVENOUS

## 2015-12-29 MED ORDER — MORPHINE SULFATE (PF) 4 MG/ML IV SOLN
4.0000 mg | Freq: Once | INTRAVENOUS | Status: AC
  Administered 2015-12-29: 4 mg via INTRAVENOUS
  Filled 2015-12-29: qty 1

## 2015-12-29 MED ORDER — SERTRALINE HCL 50 MG PO TABS
100.0000 mg | ORAL_TABLET | Freq: Every day | ORAL | Status: DC
  Administered 2015-12-29 – 2015-12-31 (×3): 100 mg via ORAL
  Filled 2015-12-29 (×3): qty 2

## 2015-12-29 MED ORDER — ATOMOXETINE HCL 60 MG PO CAPS
100.0000 mg | ORAL_CAPSULE | Freq: Every day | ORAL | Status: DC
  Filled 2015-12-29 (×3): qty 1

## 2015-12-29 MED ORDER — MORPHINE SULFATE (PF) 2 MG/ML IV SOLN
2.0000 mg | INTRAVENOUS | Status: DC | PRN
  Administered 2015-12-29: 2 mg via INTRAVENOUS
  Filled 2015-12-29: qty 1

## 2015-12-29 MED ORDER — METHYLPREDNISOLONE SODIUM SUCC 125 MG IJ SOLR
60.0000 mg | Freq: Two times a day (BID) | INTRAMUSCULAR | Status: DC
  Administered 2015-12-29 – 2015-12-31 (×4): 60 mg via INTRAVENOUS
  Filled 2015-12-29 (×4): qty 2

## 2015-12-29 MED ORDER — SODIUM CHLORIDE 0.9 % IV BOLUS (SEPSIS)
1000.0000 mL | Freq: Once | INTRAVENOUS | Status: AC
  Administered 2015-12-29: 1000 mL via INTRAVENOUS

## 2015-12-29 MED ORDER — TRAZODONE HCL 50 MG PO TABS
100.0000 mg | ORAL_TABLET | Freq: Every day | ORAL | Status: DC
Start: 2015-12-29 — End: 2015-12-31
  Administered 2015-12-29 – 2015-12-30 (×2): 100 mg via ORAL
  Filled 2015-12-29 (×2): qty 2

## 2015-12-29 MED ORDER — FAMOTIDINE 20 MG PO TABS
20.0000 mg | ORAL_TABLET | Freq: Every day | ORAL | Status: DC
  Administered 2015-12-29 – 2015-12-31 (×3): 20 mg via ORAL
  Filled 2015-12-29 (×3): qty 1

## 2015-12-29 MED ORDER — ONDANSETRON HCL 4 MG/2ML IJ SOLN
4.0000 mg | Freq: Once | INTRAMUSCULAR | Status: AC
  Administered 2015-12-29: 4 mg via INTRAVENOUS
  Filled 2015-12-29: qty 2

## 2015-12-29 MED ORDER — ATOMOXETINE HCL 25 MG PO CAPS
25.0000 mg | ORAL_CAPSULE | Freq: Every day | ORAL | Status: DC
  Filled 2015-12-29 (×3): qty 1

## 2015-12-29 MED ORDER — DIVALPROEX SODIUM 250 MG PO DR TAB
500.0000 mg | DELAYED_RELEASE_TABLET | Freq: Two times a day (BID) | ORAL | Status: DC
  Administered 2015-12-29 – 2015-12-31 (×5): 500 mg via ORAL
  Filled 2015-12-29 (×5): qty 2

## 2015-12-29 MED ORDER — ALBUTEROL SULFATE (2.5 MG/3ML) 0.083% IN NEBU: 2.5000 mg | INHALATION_SOLUTION | RESPIRATORY_TRACT | Status: DC | PRN

## 2015-12-29 NOTE — ED Notes (Signed)
Pt given a blanket and a urinal.

## 2015-12-29 NOTE — Progress Notes (Signed)
PROGRESS NOTE                                                                                                                                                                                                             Patient Demographics:    Wayne Avila, is a 25 y.o. male, DOB - [birth year], UUV:[id number]  Admit date - 12/28/2015   Admitting Physician Orvan Falconer, MD  Outpatient Primary MD for the patient is [name], MD  LOS - 0  Chief Complaint  Patient presents with  . Abdominal Pain       Brief Narrative    Wayne Avila is a 25 y.o. male with medical history significant of ulcerative colitis, bipolar 1 disorder who has been seen in the emergency department 4 times earlier this month for colitis flare-up which began roughly a month ago. He reports having over 20 episodes of bloody diarrhea earlier today with diffuse abdominal pain. He has been started on Remicade for this and has only received 1 infusion thus far. Recent abd CT showed pancolitis and recent colonoscopy showed pancolitis and inflammation of his rectum. He denies smoking, drugs, or alcohol.   ED Course: While being evaluated in the ED, CMP was unremarkable. CBC showed drop in HGB to 8.8, down from baseline of 9.3. UA was negative. Hospitalist was asked to refer for further management.   Subjective:    Wayne Avila today has, No headache, No chest pain, mild generalized abdominal pain - No Nausea, No new weakness tingling or numbness, No Cough - SOB.     Assessment  & Plan :     1.Ulcerative colitis flare with bloody diarrhea. Generalized abdominal pain. Better with IV Cipro Flagyl along with Solu-Medrol, bowel rest with clear liquids, gentle IV fluids and pain control, monitor CBC, GI consulted.  2. Anemia. Due to #1 above, check anemia panel and monitor H&H. Type screen obtained.  3. History of bipolar disorder. Stable. Home medications to  continue.    Family Communication  :  [place]  Code Status :  Full  Diet : Clears  Disposition Plan  :  Stay inpt  Consults  :  GI  Procedures  :    DVT Prophylaxis  :  SCDs    Lab Results  Component Value Date   PLT 273 12/29/2015  Inpatient Medications  Scheduled Meds: . atomoxetine  120 mg Oral Daily  . ciprofloxacin  400 mg Intravenous Q12H  . divalproex  500 mg Oral BID  . famotidine  20 mg Oral Daily  . methylPREDNISolone (SOLU-MEDROL) injection  40 mg Intravenous Q6H  . metronidazole  500 mg Intravenous Q8H  . sertraline  100 mg Oral Daily  . traZODone  100 mg Oral QHS   Continuous Infusions: . dextrose 5 % and 0.9 % NaCl with KCl 20 mEq/L 100 mL/hr at 12/29/15 0521   PRN Meds:.albuterol, morphine injection, [DISCONTINUED] ondansetron **OR** ondansetron (ZOFRAN) IV  Antibiotics  :    Anti-infectives    Start     Dose/Rate Route Frequency Ordered Stop   12/29/15 0530  ciprofloxacin (CIPRO) IVPB 400 mg     400 mg 200 mL/hr over 60 Minutes Intravenous Every 12 hours 12/29/15 0519     12/29/15 0530  metroNIDAZOLE (FLAGYL) IVPB 500 mg     500 mg 100 mL/hr over 60 Minutes Intravenous Every 8 hours 12/29/15 0519 01/08/16 0529         Objective:   Filed Vitals:   12/28/15 2255 12/29/15 0330 12/29/15 0355 12/29/15 0505  BP: 127/78 132/83 132/83 119/62  Pulse: 108 93 93 81  Temp: 98.9 F (37.2 C)   98.8 F (37.1 C)  TempSrc: Oral   Oral  Resp: 20  20 16   Height: 5' 7.5" (1.715 m)   5' 7"  (1.702 m)  Weight: 105.688 kg (233 lb)   104.872 kg (231 lb 3.2 oz)  SpO2: 100% 98% 98% 98%    Wt Readings from Last 3 Encounters:  12/29/15 104.872 kg (231 lb 3.2 oz)  12/27/15 105.688 kg (233 lb)  12/25/15 105.235 kg (232 lb)    No intake or output data in the 24 hours ending 12/29/15 0948   Physical Exam  Awake Alert, Oriented X 3, No new F.N deficits, Normal affect Wayne Avila.AT,PERRAL Supple Neck,No JVD, No cervical lymphadenopathy appriciated.    Symmetrical Chest wall movement, Good air movement bilaterally, CTAB RRR,No Gallops,Rubs or new Murmurs, No Parasternal Heave +ve B.Sounds, Abd Soft, No tenderness, No organomegaly appriciated, No rebound - guarding or rigidity. No Cyanosis, Clubbing or edema, No new Rash or bruise       Data Review:    CBC  Recent Labs Lab 12/25/15 0317 12/29/15 0050 12/29/15 0600  WBC 5.7 5.7 6.4  HGB 9.4* 8.8* 8.2*  HCT 28.1* 26.5* 24.9*  PLT 307 295 273  MCV 79.2 78.2 79.0  MCH 26.5 26.0 26.0  MCHC 33.5 33.2 32.9  RDW 12.7 12.7 12.7  LYMPHSABS 2.0 1.8  --   MONOABS 0.8 0.5  --   EOSABS 0.9* 0.6  --   BASOSABS 0.0 0.0  --     Chemistries   Recent Labs Lab 12/25/15 0317 12/29/15 0050 12/29/15 0600  NA 137 136 137  K 3.8 4.0 4.0  CL 106 109 107  CO2 24 24 25   GLUCOSE 123* 91 99  BUN 11 9 8   CREATININE 0.96 0.83 0.78  CALCIUM 8.5* 8.8* 8.3*  AST 20 19  --   ALT 17 16*  --   ALKPHOS 51 45  --   BILITOT 0.2* 0.0*  --    ------------------------------------------------------------------------------------------------------------------ No results for input(s): CHOL, HDL, LDLCALC, TRIG, CHOLHDL, LDLDIRECT in the last 72 hours.  Lab Results  Component Value Date   HGBA1C 5.7* 08/03/2013   ------------------------------------------------------------------------------------------------------------------  Recent Labs  12/29/15 0600  TSH  1.362   ------------------------------------------------------------------------------------------------------------------ No results for input(s): VITAMINB12, FOLATE, FERRITIN, TIBC, IRON, RETICCTPCT in the last 72 hours.  Coagulation profile No results for input(s): INR, PROTIME in the last 168 hours.  No results for input(s): DDIMER in the last 72 hours.  Cardiac Enzymes No results for input(s): CKMB, TROPONINI, MYOGLOBIN in the last 168 hours.  Invalid input(s):  CK ------------------------------------------------------------------------------------------------------------------ No results found for: BNP  Micro Results No results found for this or any previous visit (from the past 240 hour(s)).  Radiology Reports Dg Chest 2 View  12/13/2015  CLINICAL DATA:  Indeterminate TB test. EXAM: CHEST  2 VIEW COMPARISON:  11/21/2015 FINDINGS: The heart size and mediastinal contours are within normal limits. Both lungs are clear. The visualized skeletal structures are unremarkable. IMPRESSION: No active cardiopulmonary disease. Electronically Signed   By: Kathreen Devoid   On: 12/13/2015 13:26   Dg Abd 2 Views  12/25/2015  CLINICAL DATA:  Increasing abdominal pain EXAM: ABDOMEN - 2 VIEW COMPARISON:  Or 11/21/2015 CT FINDINGS: Moderate volume of formed stool from the ascending segment to the descending segment. No impaction or obstruction. Negative for pneumatosis or pneumoperitoneum. No concerning abdominal mass effect or calcification. Clear lung bases. IMPRESSION: Possible constipation.  No obstruction or impaction. Electronically Signed   By: Monte Fantasia M.D.   On: 12/25/2015 04:16    Time Spent in minutes  30   Lalitha Ilyas K M.D on 12/29/2015 at 9:48 AM  Between 7am to 7pm - Pager - [phone]  After 7pm go to [email or web address] - password [facility]  [facility] -  Office  [phone]

## 2015-12-29 NOTE — Consult Note (Signed)
Referring Provider: No ref. provider found Primary Care Physician:  [name], MD Primary Gastroenterologist:  Dr. [name].  Reason for Consultation:    Persistent symptoms in patient with ulcerative colitis.  HPI:   Patient is 25 year old African-American male with multiple medical problems who was diagnosed with ulcerative colitis by Dr. [name] about 5 weeks ago. He underwent colonoscopy on 11/20/2015. Terminal ileum was unremarkable. Most of his disease was located in the distal sigmoid colon and rectum but biopsies from rest of the colon reveals chronic active colitis. Patient was begun on oral mesalamine and prednisone without symptomatic improvement. Hepatitis B surface antigen was negative and so was TB testing. Patient was therefore begun on infliximab and he received first dose 2 days ago. Patient noted worsening diarrhea and rectal bleeding over the last 2 days. But 4 days ago he had multiple episodes of emesis without hematemesis. He noted worsening abdominal pain particularly in the right side. Patient came to emergency room last night. Patient was evaluated. His hemoglobin was noted to be 8.8. Few days earlier was 9.4. Patient was hospitalized and begun on Solu-Medrol IV antibiotics and clear liquids. He is on morphine when necessary for pain control. He is still having diarrhea. He just had all movement and did not see blood. He denies fever or chills. He does not take OTC NSAIDs. Family history significant for Crohn's disease and paternal aunt and Crohn's disease and great uncle on mother's side. He graduated from high school 4 years ago. He is on disability because of multiple medical problems. He does not smoke cigarettes or drink alcohol.   Past Medical History  Diagnosis Date  . Asthma   . ADHD (attention deficit hyperactivity disorder)   . Bipolar 1 disorder ([place])   . GERD (gastroesophageal reflux disease)   . Depression   . Anxiety   . Mental developmental delay  11/15/2012  . PONV (postoperative nausea and vomiting)   . Hearing loss    UC.    Marland Kitchen Abdominal pain, chronic, right upper quadrant   Ulcerative colitis was diagnosed last month.     . Seizures [facility])     Past Surgical History  Procedure Laterality Date  . Tonsillectomy    . Adenoidectomy    . Esophagogastroduodenoscopy (egd) with propofol N/A 12/07/2012    SLF:The mucosa of the esophagus appeared normal Non-erosive gastritis (inflammation) was found in the gastric antrum; multiple biopsies The duodenal mucosa showed no abnormalities in the bulb and second portion of the duodenum  . Biopsy N/A 12/07/2012    Procedure: GASTRIC BIOPSIES;  Surgeon: [name], MD;  Location: [facility];  Service: Endoscopy;  Laterality: N/A;  . Colonoscopy with propofol N/A 08/30/2013    VQM:GQQPYP mucosa in the terminal ileum/COLITIS/ MILD PROCTITIS. 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  Marland Kitchen Biopsy N/A 08/30/2013    Procedure: BIOPSY;  Surgeon: [name], MD;  Location: [facility];  Service: Endoscopy;  Laterality: N/A;  right colon,transverse colon, left colon, rectal biopsies  . Esophagogastroduodenoscopy  11/20/2015    Dr. [name]: Normal exam, stomach biopsied and duodenal biopsy.. Benign biopsies.  . Colonoscopy  11/20/2015    Dr. [name]: Severe erythema, edema, ulcers from the anal verge to 20 cm above the anal verge without mucosal sparing, remaining colon and terminal ileum appeared normal. Biopsies from the rectum revealed fulminant active chronic colitis consistent with IBD. Pathology from terminal ileum revealed  intramucosal lymphoid aggregates. Remaining colon random biopsies with inactive chronic colitis  . Colonoscopy with propofol N/A 11/20/2015    Dr. 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 Esophagogastroduodenoscopy (egd) with propofol N/A 11/20/2015     Dr. [name]: normal with normal biopsies, negative H.pylori   . Biopsy  11/20/2015    Procedure: BIOPSY;  Surgeon: [name], MD;  Location: [facility];  Service: Endoscopy;;  ileal, right colon biopsy, left colon, rectum    Prior to Admission medications   Medication Sig Start Date End Date Taking? Authorizing Provider  albuterol (PROAIR HFA) 108 (90 BASE) MCG/ACT inhaler Inhale 2 puffs into the lungs every 4 (four) hours as needed for wheezing. Persistent dry cough 09/21/13  Yes Alethea Johnathan Hausen, MD  albuterol (PROVENTIL) (2.5 MG/3ML) 0.083% nebulizer solution Inhale 3 mLs into the lungs every 6 (six) hours as needed. Shortness of breath 11/08/15  Yes Historical Provider, MD  atomoxetine (STRATTERA) 25 MG capsule Take 25 mg by mouth daily. Takes with the 100 mg to make 125 mg daily   Yes Historical Provider, MD  Budesonide (UCERIS) 2 MG/ACT FOAM Place 1 application rectally 2 (two) times daily. For 2 weeks then every evening for 4 weeks 12/18/15  Yes Orvil Feil, NP  budesonide-formoterol [facility]) 160-4.5 MCG/ACT inhaler Inhale 2 puffs into the lungs 2 (two) times daily.   Yes Historical Provider, MD  divalproex (DEPAKOTE) 500 MG DR tablet Take 1 tablet (500 mg total) by mouth 2 (two) times daily. 07/03/15  Yes Penni Bombard, MD  EPINEPHrine 0.3 mg/0.3 mL IJ SOAJ injection Inject 0.3 mg into the muscle once. Reported on 12/18/2015   Yes Historical Provider, MD  fluticasone (FLONASE) 50 MCG/ACT nasal spray Place 1 spray into both nostrils daily. Patient taking differently: Place 1 spray into both nostrils daily as needed for allergies.  11/13/14  Yes [name], NP  HYDROcodone-acetaminophen (NORCO) 5-325 MG tablet Take 1 tablet by mouth every 6 (six) hours as needed for moderate pain. 12/18/15  Yes Orvil Feil, NP  loratadine (CLARITIN) 10 MG tablet Take 10 mg by mouth at bedtime.    Yes Historical Provider, MD  mesalamine (APRISO) 0.375 G 24 hr capsule Take 4 capsules (1.5 g total) by  mouth daily. 11/13/14  Yes [name], NP  montelukast (SINGULAIR) 10 MG tablet Take 1 tablet (10 mg total) by mouth at bedtime. 11/13/14  Yes [name], NP  ondansetron (ZOFRAN ODT) 8 MG disintegrating tablet Take 1 tablet (8 mg total) by mouth every 8 (eight) hours as needed for nausea or vomiting. 12/25/15  Yes Jola Schmidt, MD  ranitidine (ZANTAC) 150 MG tablet Take 1 tablet (150 mg total) by mouth daily. 11/13/14  Yes [name], NP  sertraline (ZOLOFT) 100 MG tablet Take 1 tablet (100 mg total) by mouth daily. 11/13/14  Yes [name], NP  STRATTERA 100 MG capsule take one capsule by mouth once daily 08/09/15  Yes Historical Provider, MD  traZODone (DESYREL) 100 MG tablet Take one tablet by mouth once daily at bedtime 08/07/15  Yes Historical Provider, MD  hydrocortisone (CORTENEMA) 100 MG/60ML enema Place 1 enema (100 mg total) rectally at bedtime. Patient not taking: Reported on 12/28/2015 11/25/15   Erline Hau, MD  predniSONE (DELTASONE) 10 MG tablet Take 3 tablets (30 mg total) by mouth daily. Patient not taking: Reported on 12/18/2015 12/15/15   Margette Fast, MD  Current Facility-Administered Medications  Medication Dose Route Frequency Provider Last Rate Last Dose  . albuterol (PROVENTIL) (2.5 MG/3ML) 0.083% nebulizer solution 2.5 mg  2.5 mg Nebulization Q4H PRN Orvan Falconer, MD      . atomoxetine (STRATTERA) capsule 120 mg  120 mg Oral Daily Thurnell Lose, MD   120 mg at 12/29/15 1610  . ciprofloxacin (CIPRO) IVPB 400 mg  400 mg Intravenous Q12H Orvan Falconer, MD   400 mg at 12/29/15 0546  . dextrose 5 % and 0.9 % NaCl with KCl 20 mEq/L infusion   Intravenous Continuous Thurnell Lose, MD 75 mL/hr at 12/29/15 1135    . divalproex (DEPAKOTE) DR tablet 500 mg  500 mg Oral BID Orvan Falconer, MD   500 mg at 12/29/15 0933  . famotidine (PEPCID) tablet 20 mg  20 mg Oral Daily Orvan Falconer, MD   20 mg at 12/29/15 0933  . methylPREDNISolone sodium succinate (SOLU-MEDROL) 40 mg/mL injection  40 mg  40 mg Intravenous Q6H Orvan Falconer, MD   40 mg at 12/29/15 1138  . metroNIDAZOLE (FLAGYL) IVPB 500 mg  500 mg Intravenous Q8H Orvan Falconer, MD   500 mg at 12/29/15 0546  . morphine 2 MG/ML injection 2 mg  2 mg Intravenous Q2H PRN Orvan Falconer, MD   2 mg at 12/29/15 1135  . ondansetron (ZOFRAN) injection 4 mg  4 mg Intravenous Q6H PRN Orvan Falconer, MD      . sertraline (ZOLOFT) tablet 100 mg  100 mg Oral Daily Orvan Falconer, MD   100 mg at 12/29/15 0933  . traZODone (DESYREL) tablet 100 mg  100 mg Oral QHS Orvan Falconer, MD        Allergies as of 12/28/2015 - Review Complete 12/28/2015  Allergen Reaction Noted  . Amoxicillin-pot clavulanate Nausea And Vomiting 02/28/2011  . Ibuprofen Other (See Comments) 07/03/2015  . Omeprazole Nausea And Vomiting 04/20/2013  . Pineapple Swelling 04/14/2011  . Strawberry extract Swelling 05/08/2012  . Tomato Rash 04/14/2011    Family History  Problem Relation Age of Onset  . Asthma Mother   . Ulcers Mother   . Bipolar disorder Mother   . Colon cancer Neg Hx   . Liver disease Neg Hx   . ADD / ADHD Father   . Diabetes Maternal Grandmother   . Diabetes Maternal Grandfather     Social History   Social History  . Marital Status: Single    Spouse Name: N/A  . Number of Children: 0  . Years of Education: 12th   Occupational History  .      not working   Social History Main Topics  . Smoking status:Nonsmoker.         .         . Alcohol Use: No     Comment: denies usage  . Drug Use: No  . Sexual Activity: Yes   Other Topics Concern  . Not on file   Social History Narrative   Patient lives at home with girlfriend and family.   Caffeine Use: occasionally    Review of Systems: See HPI, otherwise normal ROS  Physical Exam: Temp:  [98.8 F (37.1 C)-98.9 F (37.2 C)] 98.8 F (37.1 C) (07/22 0505) Pulse Rate:  [81-108] 81 (07/22 0505) Resp:  [16-20] 16 (07/22 0505) BP: (119-132)/(62-83) 119/62 mmHg (07/22 0505) SpO2:  [98 %-100 %] 98 % (07/22  0505) Weight:  [231 lb 3.2 oz (104.872 kg)-233 lb (105.688 kg)] 231 lb 3.2 oz (104.872  kg) (07/22 0505) Last BM Date: 12/29/15 Patient is alert and in no acute distress. He is very quiet. Conjunctiva is pale and sclerae nonicteric. No neck masses or thyromegaly noted. Cardiac exam with regular rhythm normal S1 and S2. No murmur or gallop noted. Lungs are clear to auscultation. Abdomen is full. Bowel sounds are normal. On palpation abdomen is soft with mild tenderness in epigastric region, mid abdomen and right low quadrant. No organomegaly or masses. No peripheral edema or clubbing noted.   Lab Results:  Recent Labs  12/29/15 0050 12/29/15 0600  WBC 5.7 6.4  HGB 8.8* 8.2*  HCT 26.5* 24.9*  PLT 295 273   BMET  Recent Labs  12/29/15 0050 12/29/15 0600  NA 136 137  K 4.0 4.0  CL 109 107  CO2 24 25  GLUCOSE 91 99  BUN 9 8  CREATININE 0.83 0.78  CALCIUM 8.8* 8.3*   LFT  Recent Labs  12/29/15 0050  PROT 6.5  ALBUMIN 3.4*  AST 19  ALT 16*  ALKPHOS 45  BILITOT 0.0*    Assessment;  Patient is 25 year old African-American male with recent diagnosis of  pancolonic ulcerative colitis(very active disease in distal sigmoid colon and rectum and mild disease and rest of the colon) who has failed oral mesalamine and received first dose of infliximab 2 days ago. His symptoms are suggestive of ongoing inflammatory bowel disease. It is too early to expect response with infliximab. Need to rule out super added infection. Agree with bridging him with IV steroids while biologic therapy takes effect.  Recommendations;  Change Solu-Medrol dose to 60 mg IV every 12 hours. GI pathogen panel. CRP with a.m. Lab.   LOS: 0 days   Wayne Avila  12/29/2015, 1:05 PM

## 2015-12-29 NOTE — H&P (Signed)
History and Physical    Wayne Avila:[id number] DOB: 12/04/1990 DOA: 12/28/2015  Referring MD/NP/PA: Rolland Porter, MD PCP: [name], MD  Outpatient Specialists:   Gastroenterology; [name], MD  Patient coming from: home  Chief Complaint: bloody diarrhea and abd pain.  HPI: Wayne Avila is a 25 y.o. male with medical history significant of ulcerative colitis, bipolar 1 disorder who has been seen in the emergency department 4 times earlier this month for colitis flare-up which began roughly a month ago. He reports having over 20 episodes of bloody diarrhea earlier today with diffuse abdominal pain. He has been started on Remicade for this and has only received 1 infusion thus far. Recent abd CT showed pancolitis and recent colonoscopy showed pancolitis and inflammation of his rectum. He denies smoking, drugs, or alcohol.    ED Course: While being evaluated in the ED, CMP was unremarkable. CBC showed drop in HGB to 8.8, down from baseline of 9.3. UA was negative. Hospitalist was asked to refer for further management.  Review of Systems: As per HPI otherwise 10 point review of systems negative.    Past Medical History  Diagnosis Date  . Asthma   . ADHD (attention deficit hyperactivity disorder)   . Bipolar 1 disorder ([place])   . GERD (gastroesophageal reflux disease)   . Depression   . Anxiety   . Mental developmental delay 11/15/2012  . PONV (postoperative nausea and vomiting)   . Hearing loss   . HOH (hard of hearing)   . Abdominal pain, chronic, right upper quadrant   . Colitis   . Seizures [facility])     Past Surgical History  Procedure Laterality Date  . Tonsillectomy    . Adenoidectomy    . Esophagogastroduodenoscopy (egd) with propofol N/A 12/07/2012    SLF:The mucosa of the esophagus appeared normal Non-erosive gastritis (inflammation) was found in the gastric antrum; multiple biopsies The duodenal mucosa showed no abnormalities in the bulb and second portion of  the duodenum  . Biopsy N/A 12/07/2012    Procedure: GASTRIC BIOPSIES;  Surgeon: [name], MD;  Location: [facility];  Service: Endoscopy;  Laterality: N/A;  . Colonoscopy with propofol N/A 08/30/2013    GYI:RSWNIO mucosa in the terminal ileum/COLITIS/ MILD PROCTITIS. 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  Marland Kitchen Biopsy N/A 08/30/2013    Procedure: BIOPSY;  Surgeon: [name], MD;  Location: [facility];  Service: Endoscopy;  Laterality: N/A;  right colon,transverse colon, left colon, rectal biopsies  . Esophagogastroduodenoscopy  11/20/2015    Dr. [name]: Normal exam, stomach biopsied and duodenal biopsy.. Benign biopsies.  . Colonoscopy  11/20/2015    Dr. [name]: Severe erythema, edema, ulcers from the anal verge to 20 cm above the anal verge without mucosal sparing, remaining colon and terminal ileum appeared normal. Biopsies from the rectum revealed fulminant active chronic colitis consistent with IBD. Pathology from terminal ileum revealed intramucosal lymphoid aggregates. Remaining colon random biopsies with inactive chronic colitis  . Colonoscopy with propofol N/A 11/20/2015    Dr. 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 Esophagogastroduodenoscopy (egd) with propofol N/A 11/20/2015    Dr. [name]: normal with normal biopsies, negative H.pylori   . Biopsy  11/20/2015    Procedure: BIOPSY;  Surgeon: [name], MD;  Location: [facility];  Service: Endoscopy;;  ileal, right colon biopsy, left colon, rectum  reports that he quit smoking about 3 years ago. He has never used smokeless tobacco. He reports that he does not drink alcohol or use illicit drugs.  Allergies  Allergen Reactions  . Amoxicillin-Pot Clavulanate Nausea And Vomiting  . Ibuprofen Other (See Comments)    Patient has ulcerative colitis  . Omeprazole Nausea And Vomiting    . Pineapple Swelling  . Strawberry Extract Swelling  . Tomato Rash    Family History  Problem Relation Age of Onset  . Asthma Mother   . Ulcers Mother   . Bipolar disorder Mother   . Colon cancer Neg Hx   . Liver disease Neg Hx   . ADD / ADHD Father   . Diabetes Maternal Grandmother   . Diabetes Maternal Grandfather    Prior to Admission medications   Medication Sig Start Date End Date Taking? Authorizing Provider  albuterol (PROAIR HFA) 108 (90 BASE) MCG/ACT inhaler Inhale 2 puffs into the lungs every 4 (four) hours as needed for wheezing. Persistent dry cough 09/21/13  Yes Alethea Johnathan Hausen, MD  albuterol (PROVENTIL) (2.5 MG/3ML) 0.083% nebulizer solution Inhale 3 mLs into the lungs every 6 (six) hours as needed. Shortness of breath 11/08/15  Yes Historical Provider, MD  atomoxetine (STRATTERA) 25 MG capsule Take 25 mg by mouth daily. Takes with the 100 mg to make 125 mg daily   Yes Historical Provider, MD  Budesonide (UCERIS) 2 MG/ACT FOAM Place 1 application rectally 2 (two) times daily. For 2 weeks then every evening for 4 weeks 12/18/15  Yes Orvil Feil, NP  budesonide-formoterol [facility]) 160-4.5 MCG/ACT inhaler Inhale 2 puffs into the lungs 2 (two) times daily.   Yes Historical Provider, MD  divalproex (DEPAKOTE) 500 MG DR tablet Take 1 tablet (500 mg total) by mouth 2 (two) times daily. 07/03/15  Yes Penni Bombard, MD  EPINEPHrine 0.3 mg/0.3 mL IJ SOAJ injection Inject 0.3 mg into the muscle once. Reported on 12/18/2015   Yes Historical Provider, MD  fluticasone (FLONASE) 50 MCG/ACT nasal spray Place 1 spray into both nostrils daily. Patient taking differently: Place 1 spray into both nostrils daily as needed for allergies.  11/13/14  Yes [name], NP  HYDROcodone-acetaminophen (NORCO) 5-325 MG tablet Take 1 tablet by mouth every 6 (six) hours as needed for moderate pain. 12/18/15  Yes Orvil Feil, NP  loratadine (CLARITIN) 10 MG tablet Take 10 mg by mouth at bedtime.    Yes  Historical Provider, MD  mesalamine (APRISO) 0.375 G 24 hr capsule Take 4 capsules (1.5 g total) by mouth daily. 11/13/14  Yes [name], NP  montelukast (SINGULAIR) 10 MG tablet Take 1 tablet (10 mg total) by mouth at bedtime. 11/13/14  Yes [name], NP  ondansetron (ZOFRAN ODT) 8 MG disintegrating tablet Take 1 tablet (8 mg total) by mouth every 8 (eight) hours as needed for nausea or vomiting. 12/25/15  Yes Jola Schmidt, MD  ranitidine (ZANTAC) 150 MG tablet Take 1 tablet (150 mg total) by mouth daily. 11/13/14  Yes [name], NP  sertraline (ZOLOFT) 100 MG tablet Take 1 tablet (100 mg total) by mouth daily. 11/13/14  Yes [name], NP  STRATTERA 100 MG capsule take one capsule by mouth once daily 08/09/15  Yes Historical Provider, MD  traZODone (DESYREL) 100 MG tablet Take one tablet by mouth once daily at bedtime 08/07/15  Yes Historical Provider, MD  hydrocortisone (CORTENEMA) 100 MG/60ML enema Place 1 enema (100 mg total)  rectally at bedtime. Patient not taking: Reported on 12/28/2015 11/25/15   Erline Hau, MD  predniSONE (DELTASONE) 10 MG tablet Take 3 tablets (30 mg total) by mouth daily. Patient not taking: Reported on 12/18/2015 12/15/15   Margette Fast, MD   Physical Exam: Filed Vitals:   12/28/15 2255  BP: 127/78  Pulse: 108  Temp: 98.9 F (37.2 C)  TempSrc: Oral  Resp: 20  Height: 5' 7.5" (1.715 m)  Weight: 105.688 kg (233 lb)  SpO2: 100%   Constitutional: NAD, calm, comfortable Filed Vitals:   12/28/15 2255  BP: 127/78  Pulse: 108  Temp: 98.9 F (37.2 C)  TempSrc: Oral  Resp: 20  Height: 5' 7.5" (1.715 m)  Weight: 105.688 kg (233 lb)  SpO2: 100%   Eyes: PERRL, lids and conjunctivae normal ENMT: Mucous membranes are moist. Posterior pharynx clear of any exudate or lesions.Normal dentition.  Neck: normal, supple, no masses, no thyromegaly Respiratory: clear to auscultation bilaterally, no wheezing, no crackles. Normal respiratory effort. No  accessory muscle use.  Cardiovascular: Regular rate and rhythm, no murmurs / rubs / gallops. No extremity edema. 2+ pedal pulses. No carotid bruits.  Abdomen: mildly tender diffusely.  no masses palpated. No hepatosplenomegaly. Bowel sounds positive.  Musculoskeletal: no clubbing / cyanosis. No joint deformity upper and lower extremities. Good ROM, no contractures. Normal muscle tone.  Skin: no rashes, lesions, ulcers. No induration Neurologic: CN 2-12 grossly intact. Sensation intact, DTR normal. Strength 5/5 in all 4.  Psychiatric: Normal judgment and insight. Alert and oriented x 3. Normal mood.   Labs on Admission: I have personally reviewed following labs and imaging studies  CBC:  Recent Labs Lab 12/25/15 0317 12/29/15 0050  WBC 5.7 5.7  NEUTROABS 2.0 2.9  HGB 9.4* 8.8*  HCT 28.1* 26.5*  MCV 79.2 78.2  PLT 307 030   Basic Metabolic Panel:  Recent Labs Lab 12/25/15 0317 12/29/15 0050  NA 137 136  K 3.8 4.0  CL 106 109  CO2 24 24  GLUCOSE 123* 91  BUN 11 9  CREATININE 0.96 0.83  CALCIUM 8.5* 8.8*   Liver Function Tests:  Recent Labs Lab 12/25/15 0317 12/29/15 0050  AST 20 19  ALT 17 16*  ALKPHOS 51 45  BILITOT 0.2* 0.0*  PROT 7.0 6.5  ALBUMIN 3.7 3.4*    Recent Labs Lab 12/25/15 0317 12/29/15 0050  LIPASE 31 32   Urine analysis:    Component Value Date/Time   COLORURINE YELLOW 12/29/2015 0225   APPEARANCEUR CLEAR 12/29/2015 0225   LABSPEC 1.020 12/29/2015 0225   PHURINE 7.0 12/29/2015 0225   GLUCOSEU NEGATIVE 12/29/2015 0225   HGBUR NEGATIVE 12/29/2015 0225   BILIRUBINUR NEGATIVE 12/29/2015 0225   KETONESUR TRACE* 12/29/2015 0225   PROTEINUR NEGATIVE 12/29/2015 0225   UROBILINOGEN 0.2 09/04/2014 2355   NITRITE NEGATIVE 12/29/2015 0225   LEUKOCYTESUR NEGATIVE 12/29/2015 0225   Assessment/Plan Active Problems:   Upper abdominal pain   Rectal bleeding   Bipolar 1 disorder, manic, moderate (HCC)   Acute colitis   Ulcerative colitis  ([place])   1. Acute ulcerative colitis. Will give IV flagyl and cipro and IV steroids. Will give pain meds, clear liquids, IVFs.  Will consult GI for further recommendation.   2. Acute blood loss anemia. Secondary to GI bleeding due t acute ulcerative colitis. Hgb 8.8 with baseline 9.3. Will monitor and transfuse as necessary.  3. Bipolar 1 disorder. Appears at baseline.   DVT prophylaxis: SCD Code Status: Full  Family Communication: Discussed with patient and mother present at bedside.  Disposition Plan: Admit to medical, discharge home once improved Consults called: none Admission status: Admit to medical as inpatient.  Orvan Falconer, MD FACP [facility]  If 7PM-7AM, please contact night-coverage [email or web address] Password [email or web address]  12/29/2015, 3:32 AM   By signing my name below, I, Delene Ruffini, attest that this documentation has been prepared under the direction and in the presence of Orvan Falconer, MD. Electronically Signed: Delene Ruffini, Scribe 12/29/2015 3:30am

## 2015-12-29 NOTE — ED Provider Notes (Signed)
CSN: [id number]     Arrival date & time 12/28/15  2249 History   First MD Initiated Contact with Patient 12/28/15 2352     Chief Complaint  Patient presents with  . Abdominal Pain     (Consider location/radiation/quality/duration/timing/severity/associated sxs/prior Treatment) The history is provided by the patient and a parent.   Wayne Avila is a 25 y.o. male who has a history of ulcerative colitis and just started Remicade infusions with first dose given yesterday presenting with worsening abdominal pain, with abdominal distention which he states is a new symptom.  He has worsened persistent diarrhea stating with at least 20 episodes of diarrhea with light pink to red blood in his stools today.  He has been unable to tolerate some by mouth fluids but has been unable to tolerate solid food intake since yesterday.  He endorses subjective fever.  He has found no alleviators for his symptoms.  In addition to Remicade he also is on budesonide rectal foam.     Past Medical History  Diagnosis Date  . Asthma   . ADHD (attention deficit hyperactivity disorder)   . Bipolar 1 disorder ([place])   . GERD (gastroesophageal reflux disease)   . Depression   . Anxiety   . Mental developmental delay 11/15/2012  . PONV (postoperative nausea and vomiting)   . Hearing loss   . HOH (hard of hearing)   . Abdominal pain, chronic, right upper quadrant   . Colitis   . Seizures [facility])    Past Surgical History  Procedure Laterality Date  . Tonsillectomy    . Adenoidectomy    . Esophagogastroduodenoscopy (egd) with propofol N/A 12/07/2012    SLF:The mucosa of the esophagus appeared normal Non-erosive gastritis (inflammation) was found in the gastric antrum; multiple biopsies The duodenal mucosa showed no abnormalities in the bulb and second portion of the duodenum  . Biopsy N/A 12/07/2012    Procedure: GASTRIC BIOPSIES;  Surgeon: [name], MD;  Location: [facility];  Service: Endoscopy;  Laterality: N/A;  .  Colonoscopy with propofol N/A 08/30/2013    HER:DEYCXK mucosa in the terminal ileum/COLITIS/ MILD PROCTITIS. 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  Marland Kitchen Biopsy N/A 08/30/2013    Procedure: BIOPSY;  Surgeon: [name], MD;  Location: [facility];  Service: Endoscopy;  Laterality: N/A;  right colon,transverse colon, left colon, rectal biopsies  . Esophagogastroduodenoscopy  11/20/2015    Dr. [name]: Normal exam, stomach biopsied and duodenal biopsy.. Benign biopsies.  . Colonoscopy  11/20/2015    Dr. [name]: Severe erythema, edema, ulcers from the anal verge to 20 cm above the anal verge without mucosal sparing, remaining colon and terminal ileum appeared normal. Biopsies from the rectum revealed fulminant active chronic colitis consistent with IBD. Pathology from terminal ileum revealed intramucosal lymphoid aggregates. Remaining colon random biopsies with inactive chronic colitis  . Colonoscopy with propofol N/A 11/20/2015    Dr. 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 Esophagogastroduodenoscopy (egd) with propofol N/A 11/20/2015    Dr. [name]: normal with normal biopsies, negative H.pylori   . Biopsy  11/20/2015    Procedure: BIOPSY;  Surgeon: [name], MD;  Location: [facility] SUITE;  Service: Endoscopy;;  ileal, right colon biopsy, left colon, rectum   Family History  Problem Relation Age of Onset  . Asthma Mother   . Ulcers Mother   . Bipolar  disorder Mother   . Colon cancer Neg Hx   . Liver disease Neg Hx   . ADD / ADHD Father   . Diabetes Maternal Grandmother   . Diabetes Maternal Grandfather    Social History  Substance Use Topics  . Smoking status: Former Smoker -- 0.00 packs/day for 2 years    Quit date: 10/06/2012  . Smokeless tobacco: Never Used     Comment: Never smoked cigarettes  . Alcohol Use: No     Comment: denies  usage    Review of Systems  Constitutional: Positive for fever.  HENT: Negative for congestion and sore throat.   Eyes: Negative.   Respiratory: Negative for chest tightness and shortness of breath.   Cardiovascular: Negative for chest pain.  Gastrointestinal: Positive for abdominal pain, diarrhea and blood in stool. Negative for nausea and vomiting.  Genitourinary: Negative.   Musculoskeletal: Negative for joint swelling, arthralgias and neck pain.  Skin: Negative.  Negative for rash and wound.  Neurological: Negative for dizziness, weakness, light-headedness, numbness and headaches.  Psychiatric/Behavioral: Negative.       Allergies  Amoxicillin-pot clavulanate; Ibuprofen; Omeprazole; Pineapple; Strawberry extract; and Tomato  Home Medications   Prior to Admission medications   Medication Sig Start Date End Date Taking? Authorizing Provider  albuterol (PROAIR HFA) 108 (90 BASE) MCG/ACT inhaler Inhale 2 puffs into the lungs every 4 (four) hours as needed for wheezing. Persistent dry cough 09/21/13  Yes Alethea Johnathan Hausen, MD  albuterol (PROVENTIL) (2.5 MG/3ML) 0.083% nebulizer solution Inhale 3 mLs into the lungs every 6 (six) hours as needed. Shortness of breath 11/08/15  Yes Historical Provider, MD  atomoxetine (STRATTERA) 25 MG capsule Take 25 mg by mouth daily. Takes with the 100 mg to make 125 mg daily   Yes Historical Provider, MD  Budesonide (UCERIS) 2 MG/ACT FOAM Place 1 application rectally 2 (two) times daily. For 2 weeks then every evening for 4 weeks 12/18/15  Yes Orvil Feil, NP  budesonide-formoterol [facility]) 160-4.5 MCG/ACT inhaler Inhale 2 puffs into the lungs 2 (two) times daily.   Yes Historical Provider, MD  divalproex (DEPAKOTE) 500 MG DR tablet Take 1 tablet (500 mg total) by mouth 2 (two) times daily. 07/03/15  Yes Penni Bombard, MD  EPINEPHrine 0.3 mg/0.3 mL IJ SOAJ injection Inject 0.3 mg into the muscle once. Reported on 12/18/2015   Yes Historical Provider,  MD  fluticasone (FLONASE) 50 MCG/ACT nasal spray Place 1 spray into both nostrils daily. Patient taking differently: Place 1 spray into both nostrils daily as needed for allergies.  11/13/14  Yes [name], NP  HYDROcodone-acetaminophen (NORCO) 5-325 MG tablet Take 1 tablet by mouth every 6 (six) hours as needed for moderate pain. 12/18/15  Yes Orvil Feil, NP  loratadine (CLARITIN) 10 MG tablet Take 10 mg by mouth at bedtime.    Yes Historical Provider, MD  mesalamine (APRISO) 0.375 G 24 hr capsule Take 4 capsules (1.5 g total) by mouth daily. 11/13/14  Yes [name], NP  montelukast (SINGULAIR) 10 MG tablet Take 1 tablet (10 mg total) by mouth at bedtime. 11/13/14  Yes [name], NP  ondansetron (ZOFRAN ODT) 8 MG disintegrating tablet Take 1 tablet (8 mg total) by mouth every 8 (eight) hours as needed for nausea or vomiting. 12/25/15  Yes Jola Schmidt, MD  ranitidine (ZANTAC) 150 MG tablet Take 1 tablet (150 mg total) by mouth daily. 11/13/14  Yes [name], NP  sertraline (ZOLOFT) 100  MG tablet Take 1 tablet (100 mg total) by mouth daily. 11/13/14  Yes [name], NP  STRATTERA 100 MG capsule take one capsule by mouth once daily 08/09/15  Yes Historical Provider, MD  traZODone (DESYREL) 100 MG tablet Take one tablet by mouth once daily at bedtime 08/07/15  Yes Historical Provider, MD  hydrocortisone (CORTENEMA) 100 MG/60ML enema Place 1 enema (100 mg total) rectally at bedtime. Patient not taking: Reported on 12/28/2015 11/25/15   Erline Hau, MD  predniSONE (DELTASONE) 10 MG tablet Take 3 tablets (30 mg total) by mouth daily. Patient not taking: Reported on 12/18/2015 12/15/15   Margette Fast, MD   BP 127/78 mmHg  Pulse 108  Temp(Src) 98.9 F (37.2 C) (Oral)  Resp 20  Ht 5' 7.5" (1.715 m)  Wt 105.688 kg  BMI 35.93 kg/m2  SpO2 100% Physical Exam  Constitutional: He appears well-developed and well-nourished.  HENT:  Head: Normocephalic and atraumatic.  Eyes: Conjunctivae  are normal.  Neck: Normal range of motion.  Cardiovascular: Normal rate, regular rhythm, normal heart sounds and intact distal pulses.   Pulmonary/Chest: Effort normal and breath sounds normal. He has no wheezes. He exhibits no tenderness.  Abdominal: Soft. Bowel sounds are normal. He exhibits distension. There is tenderness in the right lower quadrant.  Generalized abdominal tenderness which is most pronounced in the RLQ. No increased tympany to percussion.  Musculoskeletal: Normal range of motion.  Neurological: He is alert.  Skin: Skin is warm and dry.  Psychiatric: He has a normal mood and affect.  Nursing note and vitals reviewed.   ED Course  Procedures (including critical care time) Labs Review Labs Reviewed  CBC WITH DIFFERENTIAL/PLATELET  COMPREHENSIVE METABOLIC PANEL  LIPASE, BLOOD  URINALYSIS, ROUTINE W REFLEX MICROSCOPIC (NOT AT [facility])    Imaging Review No results found. I have personally reviewed and evaluated these images and lab results as part of my medical decision-making.   EKG Interpretation None      MDM   Final diagnoses:  Generalized abdominal pain    Pt with history of UC with first remicade infusion ytd now with increased abd pain and distention.  Pending lab results. NS IV 1 Liter along with morphine 4 mg and zofran 4 mg IV given.    Discussed with Dr. Tomi Bamberger who will follow pt.    Evalee Jefferson, PA-C 12/29/15 6553  Rolland Porter, MD 12/29/15 [id number]  Rolland Porter, MD 12/29/15 [place], MD 12/29/15 [phone]

## 2015-12-29 NOTE — ED Notes (Signed)
Pt aware that we need a urine specimen. Pt advised that he wasn't able to urinate and he would try again when his bag of fluid is finished which should be in about 15 mins or we made need to cath him.

## 2015-12-30 DIAGNOSIS — K5100 Ulcerative (chronic) pancolitis without complications: Secondary | ICD-10-CM

## 2015-12-30 LAB — CBC
HCT: 27.4 % — ABNORMAL LOW (ref 39.0–52.0)
Hemoglobin: 8.7 g/dL — ABNORMAL LOW (ref 13.0–17.0)
MCH: 25.3 pg — ABNORMAL LOW (ref 26.0–34.0)
MCHC: 31.8 g/dL (ref 30.0–36.0)
MCV: 79.7 fL (ref 78.0–100.0)
Platelets: 353 10*3/uL (ref 150–400)
RBC: 3.44 MIL/uL — ABNORMAL LOW (ref 4.22–5.81)
RDW: 12.3 % (ref 11.5–15.5)
WBC: 9 10*3/uL (ref 4.0–10.5)

## 2015-12-30 LAB — C-REACTIVE PROTEIN: CRP: <0.5 mg/dL (ref <1.0)

## 2015-12-30 MED ORDER — SODIUM CHLORIDE 0.9 % IV SOLN
510.0000 mg | Freq: Once | INTRAVENOUS | Status: AC
  Administered 2015-12-30: 510 mg via INTRAVENOUS
  Filled 2015-12-30: qty 17

## 2015-12-30 NOTE — Progress Notes (Signed)
  Subjective:  Patient feels much better. He's hungry. He denies nausea or abdominal pain. He had one small bowel movement this morning and did not see any blood.  Objective: Blood pressure (!) 121/56, pulse 75, temperature 98.2 F (36.8 C), temperature source Oral, resp. rate 18, height 5' 7"  (1.702 m), weight 231 lb 3.2 oz (104.9 kg), SpO2 98 %. Patient is alert and has more eye contact today and is smiling. Abdomen is full. Bowel sounds are normal. On palpation abdomen is soft and nontender. No LE edema or clubbing noted.  Labs/studies Results:   Recent Labs  12/29/15 0050 12/29/15 0600 12/30/15 0604  WBC 5.7 6.4 9.0  HGB 8.8* 8.2* 8.7*  HCT 26.5* 24.9* 27.4*  PLT 295 273 353    BMET   Recent Labs  12/29/15 0050 12/29/15 0600  NA 136 137  K 4.0 4.0  CL 109 107  CO2 24 25  GLUCOSE 91 99  BUN 9 8  CREATININE 0.83 0.78  CALCIUM 8.8* 8.3*    LFT   Recent Labs  12/29/15 0050  PROT 6.5  ALBUMIN 3.4*  AST 19  ALT 16*  ALKPHOS 45  BILITOT 0.0*    GI pathogen panel is pending. CRP is pending.  Serum iron 26, TIBC 462 and saturation 6%. Ferritin 5 B 12 312  Assessment:  #1.Ulcerative colitis. Significant symptomatic improvement in the last 24 hours. Doubt that he has infection given his clinical course. If he tolerates diet he should be able to go home tomorrow. #2. Anemia secondary to GI bleeding. H&H is coming up.  Recommendations:  Agree with advancing diet. Consider parenteral iron before discharge. Next infliximab infusion on 01/09/2016

## 2015-12-30 NOTE — Progress Notes (Signed)
PROGRESS NOTE                                                                                                                                                                                                             Patient Demographics:    Wayne Avila, is a 25 y.o. male, DOB - [birth year], XMI:[id number]  Admit date - 12/28/2015   Admitting Physician Orvan Falconer, MD  Outpatient Primary MD for the patient is [name], MD  LOS - 1  Chief Complaint  Patient presents with  . Abdominal Pain       Brief Narrative    [name] is a 25 y.o. male with medical history significant of ulcerative colitis, bipolar 1 disorder who has been seen in the emergency department 4 times earlier this month for colitis flare-up which began roughly a month ago. He reports having over 20 episodes of bloody diarrhea earlier today with diffuse abdominal pain. He has been started on Remicade for this and has only received 1 infusion thus far. Recent abd CT showed pancolitis and recent colonoscopy showed pancolitis and inflammation of his rectum. He denies smoking, drugs, or alcohol.   ED Course: While being evaluated in the ED, CMP was unremarkable. CBC showed drop in HGB to 8.8, down from baseline of 9.3. UA was negative. Hospitalist was asked to refer for further management.   Subjective:    [name] today has, No headache, No chest pain, This morning no abdominal pain - No Nausea, No new weakness tingling or numbness, No Cough - SOB.     Assessment  & Plan :     1.Ulcerative colitis flare with bloody diarrhea. Generalized abdominal pain. Better with IV Cipro Flagyl along with IV Solu-Medrol, He is pain-free this morning, discussed his case with GI physician Dr. Laural Golden, likely Remicade he received in the outpatient setting few days ago is kicking in, advance diet to soft, if better likely to be discharged on 12/31/2015.  2. Anemia.  Due to #1 above, check anemia panel and monitor H&H. Type screen obtained.  3. History of bipolar disorder. Stable. Home medications to continue.    Family Communication  :  M[place]  Code Status :  Full  Diet : Soft  Disposition Plan  :  Stay inpt  Consults  :  GI  Procedures  :  DVT Prophylaxis  :  SCDs    Lab Results  Component Value Date   PLT 353 12/30/2015    Inpatient Medications  Scheduled Meds: . atomoxetine  120 mg Oral Daily  . ciprofloxacin  400 mg Intravenous Q12H  . divalproex  500 mg Oral BID  . famotidine  20 mg Oral Daily  . methylPREDNISolone (SOLU-MEDROL) injection  60 mg Intravenous Q12H  . metronidazole  500 mg Intravenous Q8H  . sertraline  100 mg Oral Daily  . traZODone  100 mg Oral QHS   Continuous Infusions: . dextrose 5 % and 0.9 % NaCl with KCl 20 mEq/L 75 mL/hr at 12/29/15 2123   PRN Meds:.albuterol, morphine injection, [DISCONTINUED] ondansetron **OR** ondansetron (ZOFRAN) IV  Antibiotics  :    Anti-infectives    Start     Dose/Rate Route Frequency Ordered Stop   12/29/15 0530  ciprofloxacin (CIPRO) IVPB 400 mg     400 mg 200 mL/hr over 60 Minutes Intravenous Every 12 hours 12/29/15 0519     12/29/15 0530  metroNIDAZOLE (FLAGYL) IVPB 500 mg     500 mg 100 mL/hr over 60 Minutes Intravenous Every 8 hours 12/29/15 0519 01/08/16 0529         Objective:   Vitals:   12/29/15 0330 12/29/15 0355 12/29/15 0505 12/29/15 2300  BP: 132/83 132/83 119/62 (!) 121/56  Pulse: 93 93 81 75  Resp:  20 16 18   Temp:   98.8 F (37.1 C) 98.2 F (36.8 C)  TempSrc:   Oral Oral  SpO2: 98% 98% 98% 100%  Weight:   104.9 kg (231 lb 3.2 oz)   Height:   5' 7"  (1.702 m)     Wt Readings from Last 3 Encounters:  12/29/15 104.9 kg (231 lb 3.2 oz)  12/27/15 105.7 kg (233 lb)  12/25/15 105.2 kg (232 lb)     Intake/Output Summary (Last 24 hours) at 12/30/15 0901 Last data filed at 12/30/15 0524  Gross per 24 hour  Intake          3319.58 ml    Output                0 ml  Net          3319.58 ml     Physical Exam  Awake Alert, Oriented X 3, No new F.N deficits, Normal affect [place].AT,PERRAL Supple Neck,No JVD, No cervical lymphadenopathy appriciated.  Symmetrical Chest wall movement, Good air movement bilaterally, CTAB RRR,No Gallops,Rubs or new Murmurs, No Parasternal Heave +ve B.Sounds, Abd Soft, No tenderness, No organomegaly appriciated, No rebound - guarding or rigidity. No Cyanosis, Clubbing or edema, No new Rash or bruise       Data Review:    CBC  Recent Labs Lab 12/25/15 0317 12/29/15 0050 12/29/15 0600 12/30/15 0604  WBC 5.7 5.7 6.4 9.0  HGB 9.4* 8.8* 8.2* 8.7*  HCT 28.1* 26.5* 24.9* 27.4*  PLT 307 295 273 353  MCV 79.2 78.2 79.0 79.7  MCH 26.5 26.0 26.0 25.3*  MCHC 33.5 33.2 32.9 31.8  RDW 12.7 12.7 12.7 12.3  LYMPHSABS 2.0 1.8  --   --   MONOABS 0.8 0.5  --   --   EOSABS 0.9* 0.6  --   --   BASOSABS 0.0 0.0  --   --     Chemistries   Recent Labs Lab 12/25/15 0317 12/29/15 0050 12/29/15 0600  NA 137 136 137  K 3.8 4.0 4.0  CL 106 109 107  CO2 24 24 25   GLUCOSE 123* 91 99  BUN 11 9 8   CREATININE 0.96 0.83 0.78  CALCIUM 8.5* 8.8* 8.3*  AST 20 19  --   ALT 17 16*  --   ALKPHOS 51 45  --   BILITOT 0.2* 0.0*  --    ------------------------------------------------------------------------------------------------------------------ No results for input(s): CHOL, HDL, LDLCALC, TRIG, CHOLHDL, LDLDIRECT in the last 72 hours.  Lab Results  Component Value Date   HGBA1C 5.7 (H) 08/03/2013   ------------------------------------------------------------------------------------------------------------------  Recent Labs  12/29/15 0600  TSH 1.362   ------------------------------------------------------------------------------------------------------------------  Recent Labs  12/29/15 0050 12/29/15 0600 12/29/15 1022  VITAMINB12 312  --   --   FOLATE  --   --  6.6  FERRITIN 5*  --    --   TIBC 462*  --   --   IRON 26*  --   --   RETICCTPCT  --  1.5  --     Coagulation profile No results for input(s): INR, PROTIME in the last 168 hours.  No results for input(s): DDIMER in the last 72 hours.  Cardiac Enzymes No results for input(s): CKMB, TROPONINI, MYOGLOBIN in the last 168 hours.  Invalid input(s): CK ------------------------------------------------------------------------------------------------------------------ No results found for: BNP  Micro Results No results found for this or any previous visit (from the past 240 hour(s)).  Radiology Reports Dg Chest 2 View  Result Date: 12/13/2015 CLINICAL DATA:  Indeterminate TB test. EXAM: CHEST  2 VIEW COMPARISON:  11/21/2015 FINDINGS: The heart size and mediastinal contours are within normal limits. Both lungs are clear. The visualized skeletal structures are unremarkable. IMPRESSION: No active cardiopulmonary disease. Electronically Signed   By: Kathreen Devoid   On: 12/13/2015 13:26   Dg Abd 2 Views  Result Date: 12/25/2015 CLINICAL DATA:  Increasing abdominal pain EXAM: ABDOMEN - 2 VIEW COMPARISON:  Or 11/21/2015 CT FINDINGS: Moderate volume of formed stool from the ascending segment to the descending segment. No impaction or obstruction. Negative for pneumatosis or pneumoperitoneum. No concerning abdominal mass effect or calcification. Clear lung bases. IMPRESSION: Possible constipation.  No obstruction or impaction. Electronically Signed   By: Monte Fantasia M.D.   On: 12/25/2015 04:16    Time Spent in minutes  30   Zayden Maffei K M.D on 12/30/2015 at 9:01 AM  Between 7am to 7pm - Pager - [phone]  After 7pm go to [email or web address] - password [facility]  [facility] -  Office  [phone]

## 2015-12-31 LAB — GASTROINTESTINAL PANEL BY PCR, STOOL (REPLACES STOOL CULTURE)

## 2015-12-31 MED ORDER — FERROUS SULFATE 325 (65 FE) MG PO TABS: 325.0000 mg | ORAL_TABLET | Freq: Three times a day (TID) | ORAL | 0 refills | Status: DC

## 2015-12-31 MED ORDER — PREDNISONE 10 MG PO TABS: 30.0000 mg | ORAL_TABLET | Freq: Every day | ORAL | 0 refills | Status: DC

## 2015-12-31 MED ORDER — METRONIDAZOLE 500 MG PO TABS: 500.0000 mg | ORAL_TABLET | Freq: Three times a day (TID) | ORAL | 0 refills | Status: DC

## 2015-12-31 NOTE — Progress Notes (Signed)
    Subjective: Feels much improved today. No further abdominal pain. No other GI symptoms.  Objective: Vital signs in last 24 hours: Temp:  [98.9 F (37.2 C)-99.2 F (37.3 C)] 99.2 F (37.3 C) (07/24 0448) Pulse Rate:  [86-93] 86 (07/24 0448) Resp:  [18] 18 (07/24 0448) BP: (131-141)/(66-77) 131/66 (07/24 0448) SpO2:  [96 %-100 %] 96 % (07/24 0448) Last BM Date: 12/29/15 General:   Obese male, alert and oriented, pleasant Heart:  S1, S2 present, no murmurs noted.  Lungs: Clear to auscultation bilaterally, without wheezing, rales, or rhonchi.  Abdomen:  Bowel sounds present, obese but soft, non-tender, non-distended. No HSM or hernias noted. No rebound or guarding. No masses appreciated  Neurologic:  Alert and  oriented x4;  grossly normal neurologically. Skin:  Warm and dry, intact without significant lesions.  Psych:  Alert and cooperative. Normal mood and affect.  Intake/Output from previous day: 07/23 0701 - 07/24 0700 In: 2366.3 [P.O.:720; I.V.:1246.3; IV Piggyback:400] Out: 4 [Urine:4] Intake/Output this shift: No intake/output data recorded.  Lab Results:  Recent Labs  12/29/15 0050 12/29/15 0600 12/30/15 0604  WBC 5.7 6.4 9.0  HGB 8.8* 8.2* 8.7*  HCT 26.5* 24.9* 27.4*  PLT 295 273 353   BMET  Recent Labs  12/29/15 0050 12/29/15 0600  NA 136 137  K 4.0 4.0  CL 109 107  CO2 24 25  GLUCOSE 91 99  BUN 9 8  CREATININE 0.83 0.78  CALCIUM 8.8* 8.3*   LFT  Recent Labs  12/29/15 0050  PROT 6.5  ALBUMIN 3.4*  AST 19  ALT 16*  ALKPHOS 45  BILITOT 0.0*   PT/INR No results for input(s): LABPROT, INR in the last 72 hours. Hepatitis Panel No results for input(s): HEPBSAG, HCVAB, HEPAIGM, HEPBIGM in the last 72 hours.   Studies/Results: No results found.  Assessment: Patient is 25 year old African-American male with recent diagnosis of  pancolonic ulcerative colitis (very active disease in distal sigmoid colon and rectum and mild disease and  rest of the colon) who has failed oral mesalamine and received first dose of infliximab 2 days ago.  He presented with symptoms likely due to ongoing IBD due to inadequate time for biologic onset. He was started on a prednisone bridge, GI Path panel and CRP ordered with GI path negative and CRP normal at 0.5. He had significant improvement over the weekend with unlikely infection. His anemia improved yesterday to 8.7 from 8.2 two days prior. His diet was advanced and noted next infliximabe dose 01/09/16 (Hep B negative, Quantiferon indeterminate but cleared by [facility]).  Today he is improved. Symptoms have resolved on prednisone. Tolerating diet well. Anemia stable. No further complaints.  Plan: 1. Complete course of Flagyl 2. Steroid taper as outpatient over 1 month 3. Remicade as previously scheduled 4. Close outpatient follow-up 2-4 weeks 5. Appears stable for discharge from GI standpoint.   Walden Field, AGNP-C Adult & Gerontological Nurse Practitioner [facility]     LOS: 2 days    12/31/2015, 8:50 AM

## 2015-12-31 NOTE — Discharge Summary (Signed)
Wayne Avila UXN:[id number] DOB: 15-Jun-1990 DOA: 12/28/2015  PCP: [name], MD  Admit date: 12/28/2015  Discharge date: 12/31/2015  Admitted From: Home  Disposition:  Home   Recommendations for Outpatient Follow-up:   Follow up with PCP in 1-2 weeks  PCP Please obtain BMP/CBC, 2 view CXR in 1week,  (see Discharge instructions)   PCP Please follow up on the following pending results: None   Home Health: None   Equipment/Devices: None  Consultations: GI Discharge Condition: Stable   CODE STATUS: Full   Diet Recommendation: Heart Healthy   Chief Complaint  Patient presents with  . Abdominal Pain     Brief history of present illness from the day of admission and additional interim summary    Wayne Avila is a 25 y.o. male with medical history significant of ulcerative colitis, bipolar 1 disorder who has been seen in the emergency department 4 times earlier this month for colitis flare-up which began roughly a month ago. He reports having over 20 episodes of bloody diarrhea earlier today with diffuse abdominal pain. He has been started on Remicade for this and has only received 1 infusion thus far. Recent abd CT showed pancolitis and recent colonoscopy showed pancolitis and inflammation of his rectum. He denies smoking, drugs, or alcohol.   ED Course: While being evaluated in the ED, CMP was unremarkable. CBC showed drop in HGB to 8.8, down from baseline of 9.3. UA was negative. Hospitalist was asked to refer for further management.  Hospital issues addressed    1.Ulcerative colitis flare with bloody diarrhea. Generalized abdominal pain. Better with IV Cipro - Flagyl along with IV Solu-Medrol, He is pain-free this morning, discussed his case with GI physician Dr. Laural Golden, likely Remicade he received in  the outpatient setting few days ago is now kicking in, he is now symptom-free without any diarrhea or abdominal pain, tolerated soft diet, he will be discharged on 12/31/2015. We will continue taking 30 mg of prednisone as he was taking prior to admission and follow with GI and PCP closely, 5 more days of oral Flagyl.  2. Iron deficiency Anemia. Due to #1 above, anemia panel suggestive of Iron deficiency anemia. Given a dose of IV ferahaem, placed on oral iron. Request PCP and GI physician to monitor iron panel along with B-12 and folate levels closely. His B-12 levels were near the low normal range.   3. History of bipolar disorder. Stable. Home medications to continue.   Discharge diagnosis     Active Problems:   Upper abdominal pain   Rectal bleeding   Bipolar 1 disorder, manic, moderate (HCC)   Acute colitis   Ulcerative colitis ([place])    Discharge instructions    Discharge Instructions    Diet - low sodium heart healthy    Complete by:  As directed   Discharge instructions    Complete by:  As directed   Follow with Primary MD [name], MD and your GI MD in 3-4 days   Get CBC,  CMP, 2 view Chest X ray checked  by Primary MD or SNF MD in 5-7 days ( we routinely change or add medications that can affect your baseline labs and fluid status, therefore we recommend that you get the mentioned basic workup next visit with your PCP, your PCP may decide not to get them or add new tests based on their clinical decision)   Activity: As tolerated with Full fall precautions use walker/cane & assistance as needed   Disposition Home     Diet:   Heart Healthy  .  For Heart failure patients - Check your Weight same time everyday, if you gain over 2 pounds, or you develop in leg swelling, experience more shortness of breath or chest pain, call your Primary MD immediately. Follow Cardiac Low Salt Diet and 1.5 lit/day fluid restriction.   On your next visit with your primary care  physician please Get Medicines reviewed and adjusted.   Please request your Prim.MD to go over all Hospital Tests and Procedure/Radiological results at the follow up, please get all Hospital records sent to your Prim MD by signing hospital release before you go home.   If you experience worsening of your admission symptoms, develop shortness of breath, life threatening emergency, suicidal or homicidal thoughts you must seek medical attention immediately by calling 911 or calling your MD immediately  if symptoms less severe.  You Must read complete instructions/literature along with all the possible adverse reactions/side effects for all the Medicines you take and that have been prescribed to you. Take any new Medicines after you have completely understood and accpet all the possible adverse reactions/side effects.   Do not drive, operate heavy machinery, perform activities at heights, swimming or participation in water activities or provide baby sitting services if your were admitted for syncope or siezures until you have seen by Primary MD or a Neurologist and advised to do so again.  Do not drive when taking Pain medications.    Do not take more than prescribed Pain, Sleep and Anxiety Medications  Special Instructions: If you have smoked or chewed Tobacco  in the last 2 yrs please stop smoking, stop any regular Alcohol  and or any Recreational drug use.  Wear Seat belts while driving.   Please note  You were cared for by a hospitalist during your hospital stay. If you have any questions about your discharge medications or the care you received while you were in the hospital after you are discharged, you can call the unit and asked to speak with the hospitalist on call if the hospitalist that took care of you is not available. Once you are discharged, your primary care physician will handle any further medical issues. Please note that NO REFILLS for any discharge medications will be  authorized once you are discharged, as it is imperative that you return to your primary care physician (or establish a relationship with a primary care physician if you do not have one) for your aftercare needs so that they can reassess your need for medications and monitor your lab values.   Increase activity slowly    Complete by:  As directed      Discharge Medications     Medication List    TAKE these medications   albuterol 108 (90 Base) MCG/ACT inhaler Commonly known as:  PROAIR HFA Inhale 2 puffs into the lungs every 4 (four) hours as needed for wheezing. Persistent dry cough   albuterol (2.5 MG/3ML) 0.083% nebulizer solution Commonly known  as:  PROVENTIL Inhale 3 mLs into the lungs every 6 (six) hours as needed. Shortness of breath   atomoxetine 25 MG capsule Commonly known as:  STRATTERA Take 25 mg by mouth daily. Takes with the 100 mg to make 125 mg daily   STRATTERA 100 MG capsule Generic drug:  atomoxetine take one capsule by mouth once daily   Budesonide 2 MG/ACT Foam Commonly known as:  UCERIS Place 1 application rectally 2 (two) times daily. For 2 weeks then every evening for 4 weeks   budesonide-formoterol 160-4.5 MCG/ACT inhaler Commonly known as:  SYMBICORT Inhale 2 puffs into the lungs 2 (two) times daily.   divalproex 500 MG DR tablet Commonly known as:  DEPAKOTE Take 1 tablet (500 mg total) by mouth 2 (two) times daily.   EPINEPHrine 0.3 mg/0.3 mL Soaj injection Commonly known as:  EPI-PEN Inject 0.3 mg into the muscle once. Reported on 12/18/2015   ferrous sulfate 325 (65 FE) MG tablet Take 1 tablet (325 mg total) by mouth 3 (three) times daily with meals.   fluticasone 50 MCG/ACT nasal spray Commonly known as:  FLONASE Place 1 spray into both nostrils daily. What changed:  when to take this  reasons to take this   HYDROcodone-acetaminophen 5-325 MG tablet Commonly known as:  NORCO Take 1 tablet by mouth every 6 (six) hours as needed for  moderate pain.   hydrocortisone 100 MG/60ML enema Commonly known as:  CORTENEMA Place 1 enema (100 mg total) rectally at bedtime.   loratadine 10 MG tablet Commonly known as:  CLARITIN Take 10 mg by mouth at bedtime.   mesalamine 0.375 g 24 hr capsule Commonly known as:  APRISO Take 4 capsules (1.5 g total) by mouth daily.   metroNIDAZOLE 500 MG tablet Commonly known as:  FLAGYL Take 1 tablet (500 mg total) by mouth 3 (three) times daily.   montelukast 10 MG tablet Commonly known as:  SINGULAIR Take 1 tablet (10 mg total) by mouth at bedtime.   ondansetron 8 MG disintegrating tablet Commonly known as:  ZOFRAN ODT Take 1 tablet (8 mg total) by mouth every 8 (eight) hours as needed for nausea or vomiting.   predniSONE 10 MG tablet Commonly known as:  DELTASONE Take 3 tablets (30 mg total) by mouth daily.   ranitidine 150 MG tablet Commonly known as:  ZANTAC Take 1 tablet (150 mg total) by mouth daily.   sertraline 100 MG tablet Commonly known as:  ZOLOFT Take 1 tablet (100 mg total) by mouth daily.   traZODone 100 MG tablet Commonly known as:  DESYREL Take one tablet by mouth once daily at bedtime       Allergies  Allergen Reactions  . Amoxicillin-Pot Clavulanate Nausea And Vomiting  . Ibuprofen Other (See Comments)    Patient has ulcerative colitis  . Omeprazole Nausea And Vomiting  . Pineapple Swelling  . Strawberry Extract Swelling  . Tomato Rash    Follow-up Information    [name], MD. Schedule an appointment as soon as possible for a visit in 3 day(s).   Specialty:  Internal Medicine Contact information: [place] [phone] [phone]        Barney Drain, MD. Schedule an appointment as soon as possible for a visit in 3 day(s).   Specialty:  Gastroenterology Contact information: [address] [place] [address] [phone]           Major procedures and Radiology Reports - PLEASE review detailed and  final reports thoroughly  -        Dg Chest 2 View  Result Date: 12/13/2015 CLINICAL DATA:  Indeterminate TB test. EXAM: CHEST  2 VIEW COMPARISON:  11/21/2015 FINDINGS: The heart size and mediastinal contours are within normal limits. Both lungs are clear. The visualized skeletal structures are unremarkable. IMPRESSION: No active cardiopulmonary disease. Electronically Signed   By: Kathreen Devoid   On: 12/13/2015 13:26   Dg Abd 2 Views  Result Date: 12/25/2015 CLINICAL DATA:  Increasing abdominal pain EXAM: ABDOMEN - 2 VIEW COMPARISON:  Or 11/21/2015 CT FINDINGS: Moderate volume of formed stool from the ascending segment to the descending segment. No impaction or obstruction. Negative for pneumatosis or pneumoperitoneum. No concerning abdominal mass effect or calcification. Clear lung bases. IMPRESSION: Possible constipation.  No obstruction or impaction. Electronically Signed   By: Monte Fantasia M.D.   On: 12/25/2015 04:16    Micro Results     No results found for this or any previous visit (from the past 240 hour(s)).  Today   Subjective    Malakye Nolden today has no headache,no chest abdominal pain,no new weakness tingling or numbness, feels much better wants to go home today.     Objective   Blood pressure 131/66, pulse 86, temperature 99.2 F (37.3 C), temperature source Oral, resp. rate 18, height 5' 7"  (1.702 m), weight 104.9 kg (231 lb 3.2 oz), SpO2 96 %.   Intake/Output Summary (Last 24 hours) at 12/31/15 0753 Last data filed at 12/31/15 0000  Gross per 24 hour  Intake          2366.25 ml  Output                4 ml  Net          2362.25 ml    Exam Awake Alert, Oriented x 3, No new F.N deficits, Normal affect [place].AT,PERRAL Supple Neck,No JVD, No cervical lymphadenopathy appriciated.  Symmetrical Chest wall movement, Good air movement bilaterally, CTAB RRR,No Gallops,Rubs or new Murmurs, No Parasternal Heave +ve B.Sounds, Abd Soft, Non tender, No organomegaly  appriciated, No rebound -guarding or rigidity. No Cyanosis, Clubbing or edema, No new Rash or bruise   Data Review   CBC w Diff:  Lab Results  Component Value Date   WBC 9.0 12/30/2015   HGB 8.7 (L) 12/30/2015   HCT 27.4 (L) 12/30/2015   HCT 26.7 (L) 11/22/2015   PLT 353 12/30/2015   LYMPHOPCT 31 12/29/2015   MONOPCT 8 12/29/2015   EOSPCT 10 12/29/2015   BASOPCT 0 12/29/2015    CMP:  Lab Results  Component Value Date   NA 137 12/29/2015   K 4.0 12/29/2015   CL 107 12/29/2015   CO2 25 12/29/2015   BUN 8 12/29/2015   CREATININE 0.78 12/29/2015   CREATININE 0.92 04/23/2015   PROT 6.5 12/29/2015   ALBUMIN 3.4 (L) 12/29/2015   BILITOT 0.0 (L) 12/29/2015   ALKPHOS 45 12/29/2015   AST 19 12/29/2015   ALT 16 (L) 12/29/2015  .   Total Time in preparing paper work, data evaluation and todays exam - 35 minutes  Thurnell Lose M.D on 12/31/2015 at 7:53 AM  [facility]   Office  [phone]

## 2015-12-31 NOTE — Discharge Instructions (Signed)
Follow with Primary MD [name], MD and your GI MD in 3-4 days   Get CBC, CMP, 2 view Chest X ray checked  by Primary MD or SNF MD in 5-7 days ( we routinely change or add medications that can affect your baseline labs and fluid status, therefore we recommend that you get the mentioned basic workup next visit with your PCP, your PCP may decide not to get them or add new tests based on their clinical decision)   Activity: As tolerated with Full fall precautions use walker/cane & assistance as needed   Disposition Home     Diet:   Heart Healthy  .  For Heart failure patients - Check your Weight same time everyday, if you gain over 2 pounds, or you develop in leg swelling, experience more shortness of breath or chest pain, call your Primary MD immediately. Follow Cardiac Low Salt Diet and 1.5 lit/day fluid restriction.   On your next visit with your primary care physician please Get Medicines reviewed and adjusted.   Please request your Prim.MD to go over all Hospital Tests and Procedure/Radiological results at the follow up, please get all Hospital records sent to your Prim MD by signing hospital release before you go home.   If you experience worsening of your admission symptoms, develop shortness of breath, life threatening emergency, suicidal or homicidal thoughts you must seek medical attention immediately by calling 911 or calling your MD immediately  if symptoms less severe.  You Must read complete instructions/literature along with all the possible adverse reactions/side effects for all the Medicines you take and that have been prescribed to you. Take any new Medicines after you have completely understood and accpet all the possible adverse reactions/side effects.   Do not drive, operate heavy machinery, perform activities at heights, swimming or participation in water activities or provide baby sitting services if your were admitted for syncope or siezures until you have seen  by Primary MD or a Neurologist and advised to do so again.  Do not drive when taking Pain medications.    Do not take more than prescribed Pain, Sleep and Anxiety Medications  Special Instructions: If you have smoked or chewed Tobacco  in the last 2 yrs please stop smoking, stop any regular Alcohol  and or any Recreational drug use.  Wear Seat belts while driving.   Please note  You were cared for by a hospitalist during your hospital stay. If you have any questions about your discharge medications or the care you received while you were in the hospital after you are discharged, you can call the unit and asked to speak with the hospitalist on call if the hospitalist that took care of you is not available. Once you are discharged, your primary care physician will handle any further medical issues. Please note that NO REFILLS for any discharge medications will be authorized once you are discharged, as it is imperative that you return to your primary care physician (or establish a relationship with a primary care physician if you do not have one) for your aftercare needs so that they can reassess your need for medications and monitor your lab values.

## 2016-01-01 ENCOUNTER — Encounter: Payer: Self-pay | Admitting: Gastroenterology

## 2016-01-02 NOTE — Progress Notes (Signed)
Pt's mom is aware of results.

## 2016-01-10 ENCOUNTER — Encounter (HOSPITAL_COMMUNITY)
Admission: RE | Admit: 2016-01-10 | Discharge: 2016-01-10 | Disposition: A | Discharge status: Home / Self Care | Payer: Medicaid Other | Source: Ambulatory Visit | Attending: Gastroenterology | Admitting: Gastroenterology

## 2016-01-10 ENCOUNTER — Encounter (HOSPITAL_COMMUNITY): Admission: RE | Admit: 2016-01-10 | Payer: Medicaid Other | Source: Ambulatory Visit

## 2016-01-10 DIAGNOSIS — K5190 Ulcerative colitis, unspecified, without complications: Secondary | ICD-10-CM | POA: Insufficient documentation

## 2016-01-10 LAB — CBC WITH DIFFERENTIAL/PLATELET
Basophils Absolute: 0 10*3/uL (ref 0.0–0.1)
Basophils Relative: 0 %
Eosinophils Absolute: 0.2 10*3/uL (ref 0.0–0.7)
Eosinophils Relative: 3 %
HCT: 34.3 % — ABNORMAL LOW (ref 39.0–52.0)
Hemoglobin: 11 g/dL — ABNORMAL LOW (ref 13.0–17.0)
Lymphocytes Relative: 39 %
Lymphs Abs: 2.5 10*3/uL (ref 0.7–4.0)
MCH: 27.2 pg (ref 26.0–34.0)
MCHC: 32.1 g/dL (ref 30.0–36.0)
MCV: 84.7 fL (ref 78.0–100.0)
Monocytes Absolute: 0.7 10*3/uL (ref 0.1–1.0)
Monocytes Relative: 11 %
Neutro Abs: 3 10*3/uL (ref 1.7–7.7)
Neutrophils Relative %: 47 %
Platelets: 324 10*3/uL (ref 150–400)
RBC: 4.05 MIL/uL — ABNORMAL LOW (ref 4.22–5.81)
RDW: 18.1 % — ABNORMAL HIGH (ref 11.5–15.5)
WBC: 6.4 10*3/uL (ref 4.0–10.5)

## 2016-01-10 MED ORDER — SODIUM CHLORIDE 0.9 % IV SOLN
INTRAVENOUS | Status: DC
  Administered 2016-01-10: 13:00:00 via INTRAVENOUS

## 2016-01-10 MED ORDER — SODIUM CHLORIDE 0.9 % IV SOLN
5.0000 mg/kg | INTRAVENOUS | Status: DC
  Administered 2016-01-10: 500 mg via INTRAVENOUS
  Filled 2016-01-10: qty 50

## 2016-01-18 ENCOUNTER — Encounter (HOSPITAL_COMMUNITY): Payer: Self-pay

## 2016-01-18 ENCOUNTER — Emergency Department (HOSPITAL_COMMUNITY)
Admission: EM | Admit: 2016-01-18 | Discharge: 2016-01-19 | Disposition: A | Discharge status: Home / Self Care | Payer: Medicaid Other | Attending: Emergency Medicine | Admitting: Emergency Medicine

## 2016-01-18 DIAGNOSIS — J45909 Unspecified asthma, uncomplicated: Secondary | ICD-10-CM | POA: Diagnosis not present

## 2016-01-18 DIAGNOSIS — K625 Hemorrhage of anus and rectum: Secondary | ICD-10-CM | POA: Insufficient documentation

## 2016-01-18 DIAGNOSIS — Z79899 Other long term (current) drug therapy: Secondary | ICD-10-CM | POA: Insufficient documentation

## 2016-01-18 DIAGNOSIS — F909 Attention-deficit hyperactivity disorder, unspecified type: Secondary | ICD-10-CM | POA: Insufficient documentation

## 2016-01-18 DIAGNOSIS — Z87891 Personal history of nicotine dependence: Secondary | ICD-10-CM | POA: Diagnosis not present

## 2016-01-18 DIAGNOSIS — R1011 Right upper quadrant pain: Secondary | ICD-10-CM | POA: Diagnosis present

## 2016-01-18 DIAGNOSIS — K529 Noninfective gastroenteritis and colitis, unspecified: Secondary | ICD-10-CM | POA: Insufficient documentation

## 2016-01-18 DIAGNOSIS — K5190 Ulcerative colitis, unspecified, without complications: Secondary | ICD-10-CM

## 2016-01-18 LAB — CBC WITH DIFFERENTIAL/PLATELET
Basophils Absolute: 0 10*3/uL (ref 0.0–0.1)
Basophils Relative: 0 %
Eosinophils Absolute: 0.2 10*3/uL (ref 0.0–0.7)
Eosinophils Relative: 3 %
HCT: 38 % — ABNORMAL LOW (ref 39.0–52.0)
Hemoglobin: 12.5 g/dL — ABNORMAL LOW (ref 13.0–17.0)
Lymphocytes Relative: 40 %
Lymphs Abs: 2.3 10*3/uL (ref 0.7–4.0)
MCH: 27.8 pg (ref 26.0–34.0)
MCHC: 32.9 g/dL (ref 30.0–36.0)
MCV: 84.4 fL (ref 78.0–100.0)
Monocytes Absolute: 0.6 10*3/uL (ref 0.1–1.0)
Monocytes Relative: 11 %
Neutro Abs: 2.6 10*3/uL (ref 1.7–7.7)
Neutrophils Relative %: 46 %
Platelets: 271 10*3/uL (ref 150–400)
RBC: 4.5 MIL/uL (ref 4.22–5.81)
RDW: 18.8 % — ABNORMAL HIGH (ref 11.5–15.5)
WBC: 5.7 10*3/uL (ref 4.0–10.5)

## 2016-01-18 LAB — URINALYSIS, ROUTINE W REFLEX MICROSCOPIC
Bilirubin Urine: NEGATIVE
Glucose, UA: NEGATIVE mg/dL
Hgb urine dipstick: NEGATIVE
Leukocytes, UA: NEGATIVE
Nitrite: NEGATIVE
Protein, ur: NEGATIVE mg/dL
Specific Gravity, Urine: >1.03 — ABNORMAL HIGH (ref 1.005–1.030)
pH: 6 (ref 5.0–8.0)

## 2016-01-18 LAB — BASIC METABOLIC PANEL
Anion gap: 6 (ref 5–15)
BUN: 7 mg/dL (ref 6–20)
CO2: 22 mmol/L (ref 22–32)
Calcium: 8.9 mg/dL (ref 8.9–10.3)
Chloride: 107 mmol/L (ref 101–111)
Creatinine, Ser: 1 mg/dL (ref 0.61–1.24)
GFR calc Af Amer: >60 mL/min (ref >60)
GFR calc non Af Amer: >60 mL/min (ref >60)
Glucose, Bld: 109 mg/dL — ABNORMAL HIGH (ref 65–99)
Potassium: 4.2 mmol/L (ref 3.5–5.1)
Sodium: 135 mmol/L (ref 135–145)

## 2016-01-18 MED ORDER — SODIUM CHLORIDE 0.9 % IV BOLUS (SEPSIS)
1000.0000 mL | Freq: Once | INTRAVENOUS | Status: AC
  Administered 2016-01-19: 1000 mL via INTRAVENOUS

## 2016-01-18 MED ORDER — FENTANYL CITRATE (PF) 100 MCG/2ML IJ SOLN
50.0000 ug | Freq: Once | INTRAMUSCULAR | Status: AC
Start: 2016-01-19 — End: 2016-01-19
  Administered 2016-01-19: 50 ug via INTRAVENOUS
  Filled 2016-01-18: qty 2

## 2016-01-18 NOTE — ED Notes (Signed)
MD at bedside. 

## 2016-01-18 NOTE — ED Triage Notes (Signed)
Having pain in my left side and there feels like a knot is there.  He has blood in his stool and he has ulcerative colitis and has pain in his right side when it is acting up per family.

## 2016-01-18 NOTE — ED Provider Notes (Signed)
[facility] DEPT Provider Note   CSN: [id number] Arrival date & time: 01/18/16  2115  First Provider Contact:  First MD Initiated Contact with Patient 01/18/16 2345     By signing my name below, I, Wayne Avila, attest that this documentation has been prepared under the direction and in the presence of Rolland Porter, MD. Electronically Signed: Judithann Sauger, ED Scribe. 01/18/16. 12:12 AM.    History   Chief Complaint Chief Complaint  Patient presents with  . Flank Pain    HPI Comments: Wayne Avila is a 25 y.o. male with a hx of Ulcerative Colitis, rectal bleeding, and RUQ pain who presents to the Emergency Department complaining of ongoing left lateral side pain with a "knot" in the skin onset this evening. He notes that he was feeling fine prior to onset. He reports one episode of blood in stool and when he wiped. No alleviating factors noted. He states that he has tried Vicodin with no relief. Pt was admitted on 12/29/15 for 2 days after being seen here for ongoing abdominal pain with distention where he received antibiotics and placed on Iron for his anemia. He denies that he was discharged on steroids at that time. He explains that he normally has right-sided abdominal pain with his hx of Ulcerative colitis. He received his latest Remicade infusion (he has had 2 so far) on August 3rd. He denies any fever, chills, color change to area ,or shortness of breath.   GI: Dr. [name] PCP: Dr. Jeanie Cooks   The history is provided by the patient. No language interpreter was used.    Past Medical History:  Diagnosis Date  . Abdominal pain, chronic, right upper quadrant   . ADHD (attention deficit hyperactivity disorder)   . Anxiety   . Asthma   . Bipolar 1 disorder ([place])   . Colitis   . Depression   . GERD (gastroesophageal reflux disease)   . Hearing loss   . HOH (hard of hearing)   . Mental developmental delay 11/15/2012  . PONV (postoperative nausea and vomiting)   . Seizures  [facility])     Patient Active Problem List   Diagnosis Date Noted  . Ulcerative colitis ([place]) 12/29/2015  . CAP (community acquired pneumonia)   . 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 Acute colitis 11/21/2015  . Aspiration pneumonitis ([place]) 11/21/2015  . Chronic blood loss anemia 11/21/2015  . Bipolar I disorder, most recent episode depressed ([place])   . Bipolar 1 disorder, manic, moderate ([place]) 11/09/2014  . Suicidal ideation   . Major depressive disorder, recurrent episode, moderate ([place])   . Oral candida 02/23/2014  . UC (ulcerative colitis) ([place]) 10/25/2013  . Abdominal pain, acute 09/20/2013  . Rectal bleeding 08/09/2013  . Unintentional weight loss 08/09/2013  . RUQ pain 08/09/2013  . Unspecified gastritis and gastroduodenitis without mention of hemorrhage 04/20/2013  . Upper abdominal pain 12/01/2012  . Nausea with vomiting 12/01/2012  . Mental developmental delay 11/15/2012    Past Surgical History:  Procedure Laterality Date  . ADENOIDECTOMY    . BIOPSY N/A 12/07/2012   Procedure: GASTRIC BIOPSIES;  Surgeon: [name], MD;  Location: [facility] ORS;  Service: Endoscopy;  Laterality: N/A;  . BIOPSY N/A 08/30/2013   Procedure: BIOPSY;  Surgeon: [name], MD;  Location: [facility] ORS;  Service: Endoscopy;  Laterality: N/A;  right colon,transverse colon, left colon, rectal biopsies  . BIOPSY  11/20/2015   Procedure: BIOPSY;  Surgeon: [name], MD;  Location: [facility] ENDO SUITE;  Service: Endoscopy;;  ileal, right colon biopsy, left colon, rectum  . COLONOSCOPY  11/20/2015   Dr. [name]: Severe erythema, edema, ulcers from the anal verge to 20 cm above the anal verge without mucosal sparing, remaining colon and terminal ileum appeared normal. Biopsies from the rectum revealed fulminant active chronic colitis consistent with IBD. Pathology from terminal ileum revealed intramucosal lymphoid aggregates. Remaining colon random biopsies with inactive chronic colitis  . COLONOSCOPY WITH PROPOFOL N/A  08/30/2013   JGG:EZMOQH mucosa in the terminal ileum/COLITIS/ MILD PROCTITIS. 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  Marland Kitchen COLONOSCOPY WITH PROPOFOL N/A 11/20/2015   Dr. 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 ESOPHAGOGASTRODUODENOSCOPY  11/20/2015   Dr. [name]: Normal exam, stomach biopsied and duodenal biopsy.. Benign biopsies.  . ESOPHAGOGASTRODUODENOSCOPY (EGD) WITH PROPOFOL N/A 12/07/2012   SLF:The mucosa of the esophagus appeared normal Non-erosive gastritis (inflammation) was found in the gastric antrum; multiple biopsies The duodenal mucosa showed no abnormalities in the bulb and second portion of the duodenum  . ESOPHAGOGASTRODUODENOSCOPY (EGD) WITH PROPOFOL N/A 11/20/2015   Dr. [name]: normal with normal biopsies, negative H.pylori   . TONSILLECTOMY         Home Medications    Prior to Admission medications   Medication Sig Start Date End Date Taking? Authorizing Provider  albuterol (PROAIR HFA) 108 (90 BASE) MCG/ACT inhaler Inhale 2 puffs into the lungs every 4 (four) hours as needed for wheezing. Persistent dry cough 09/21/13   Sandi Mealy, MD  albuterol (PROVENTIL) (2.5 MG/3ML) 0.083% nebulizer solution Inhale 3 mLs into the lungs every 6 (six) hours as needed. Shortness of breath 11/08/15   Historical Provider, MD  atomoxetine (STRATTERA) 25 MG capsule Take 25 mg by mouth daily. Takes with the 100 mg to make 125 mg daily    Historical Provider, MD  Budesonide (UCERIS) 2 MG/ACT FOAM Place 1 application rectally 2 (two) times daily. For 2 weeks then every evening for 4 weeks 12/18/15   Orvil Feil, NP  budesonide-formoterol [facility]) 160-4.5 MCG/ACT inhaler Inhale 2 puffs into the lungs 2 (two) times daily.    Historical Provider, MD  divalproex (DEPAKOTE) 500 MG DR tablet Take 1 tablet (500 mg total) by mouth 2 (two) times daily.  07/03/15   Penni Bombard, MD  EPINEPHrine 0.3 mg/0.3 mL IJ SOAJ injection Inject 0.3 mg into the muscle once. Reported on 12/18/2015    Historical Provider, MD  ferrous sulfate 325 (65 FE) MG tablet Take 1 tablet (325 mg total) by mouth 3 (three) times daily with meals. 12/31/15   Thurnell Lose, MD  fluticasone (FLONASE) 50 MCG/ACT nasal spray Place 1 spray into both nostrils daily. Patient taking differently: Place 1 spray into both nostrils daily as needed for allergies.  11/13/14   [name], NP  HYDROcodone-acetaminophen (NORCO) 5-325 MG tablet Take 1 tablet by mouth every 6 (six) hours as needed for moderate pain. 12/18/15   Orvil Feil, NP  hydrocortisone (CORTENEMA) 100 MG/60ML enema Place 1 enema (100 mg total) rectally at bedtime. Patient not taking: Reported on 12/28/2015 11/25/15   Erline Hau, MD  loratadine (CLARITIN) 10 MG tablet Take 10 mg by mouth at bedtime.     Historical Provider, MD  mesalamine (APRISO) 0.375 G 24 hr capsule Take 4 capsules (1.5 g total) by mouth daily. 11/13/14   [name], NP  metroNIDAZOLE (FLAGYL) 500 MG tablet Take 1 tablet (500 mg total) by mouth 3 (three) times daily. 12/31/15   Thurnell Lose, MD  montelukast (SINGULAIR) 10 MG tablet Take 1 tablet (10 mg total) by mouth at bedtime. 11/13/14   [name], NP  ondansetron (ZOFRAN ODT) 8 MG disintegrating tablet Take 1 tablet (8 mg total) by mouth every 8 (eight) hours as needed for nausea or vomiting. 12/25/15   Jola Schmidt, MD  predniSONE (DELTASONE) 10 MG tablet Take 3 tablets (30 mg total) by mouth daily. 12/31/15   Thurnell Lose, MD  ranitidine (ZANTAC) 150 MG tablet Take 1 tablet (150 mg total) by mouth daily. 11/13/14   [name], NP  sertraline (ZOLOFT) 100 MG tablet Take 1 tablet (100 mg total) by mouth daily. 11/13/14   [name], NP  STRATTERA 100 MG capsule take one capsule by mouth once daily 08/09/15   Historical Provider, MD  traMADol (ULTRAM) 50 MG tablet Take 2  tablets (100 mg total) by mouth every 6 (six) hours as needed. 01/19/16   Rolland Porter, MD  traZODone (DESYREL) 100 MG tablet Take one tablet by mouth once daily at bedtime 08/07/15   Historical Provider, MD    Family History Family History  Problem Relation Age of Onset  . Asthma Mother   . Ulcers Mother   . Bipolar disorder Mother   . ADD / ADHD Father   . Diabetes Maternal Grandmother   . Diabetes Maternal Grandfather   . Colon cancer Neg Hx   . Liver disease Neg Hx     Social History Social History  Substance Use Topics  . Smoking status: Former Smoker    Packs/day: 0.00    Years: 2.00    Quit date: 10/06/2012  . Smokeless tobacco: Never Used     Comment: Never smoked cigarettes  . Alcohol use No     Comment: denies usage  lives with mother   Allergies   Amoxicillin-pot clavulanate; Ibuprofen; Omeprazole; Pineapple; Strawberry extract; and Tomato   Review of Systems Review of Systems  Constitutional: Negative for chills and fever.  Respiratory: Negative for shortness of breath.   Musculoskeletal: Positive for myalgias.  Skin: Negative for color change and rash.  All other systems reviewed and are negative.    Physical Exam Updated Vital Signs BP 115/64   Pulse 87   Temp 99.1 F (37.3 C) (Oral)   Resp 18   Ht 5' 7"  (1.702 m)   Wt 230 lb (104.3 kg)   SpO2 100%   BMI 36.02 kg/m   Vital signs normal    Physical Exam  Constitutional: He is oriented to person, place, and time. He appears well-developed and well-nourished.  Non-toxic appearance. He does not appear ill. No distress.  HENT:  Head: Normocephalic and atraumatic.  Right Ear: External ear normal.  Left Ear: External ear normal.  Nose: Nose normal. No mucosal edema or rhinorrhea.  Mouth/Throat: Mucous membranes are normal. No dental abscesses or uvula swelling.  Tongue was dry  Eyes: Conjunctivae and EOM are normal. Pupils are equal, round, and reactive to light.  Neck: Normal range of motion  and full passive range of motion without pain. Neck supple.  Cardiovascular: Normal rate, regular rhythm and normal heart sounds.  Exam reveals no gallop and no friction rub.   No murmur heard. Pulmonary/Chest: Effort normal and breath sounds normal. No respiratory distress. He has no wheezes. He has no rhonchi. He has no rales.  He exhibits no tenderness and no crepitus.  Abdominal: Soft. Normal appearance and bowel sounds are normal. He exhibits no distension. There is tenderness. There is no rebound and no guarding.    Tender diffusely on left  Musculoskeletal: Normal range of motion. He exhibits no edema or tenderness.  Moves all extremities well.   Neurological: He is alert and oriented to person, place, and time. He has normal strength. No cranial nerve deficit.  Skin: Skin is warm, dry and intact. No rash noted. No erythema. No pallor.  Psychiatric: He has a normal mood and affect. His speech is normal and behavior is normal. His mood appears not anxious.  Nursing note and vitals reviewed.    ED Treatments / Results  DIAGNOSTIC STUDIES: Oxygen Saturation is 100% on RA, normal by my interpretation.    Labs (all labs ordered are listed, but only abnormal results are displayed) Results for orders placed or performed during the hospital encounter of 01/18/16  CBC with Differential  Result Value Ref Range   WBC 5.7 4.0 - 10.5 K/uL   RBC 4.50 4.22 - 5.81 MIL/uL   Hemoglobin 12.5 (L) 13.0 - 17.0 g/dL   HCT 38.0 (L) 39.0 - 52.0 %   MCV 84.4 78.0 - 100.0 fL   MCH 27.8 26.0 - 34.0 pg   MCHC 32.9 30.0 - 36.0 g/dL   RDW 18.8 (H) 11.5 - 15.5 %   Platelets 271 150 - 400 K/uL   Neutrophils Relative % 46 %   Neutro Abs 2.6 1.7 - 7.7 K/uL   Lymphocytes Relative 40 %   Lymphs Abs 2.3 0.7 - 4.0 K/uL   Monocytes Relative 11 %   Monocytes Absolute 0.6 0.1 - 1.0 K/uL   Eosinophils Relative 3 %   Eosinophils Absolute 0.2 0.0 - 0.7 K/uL   Basophils Relative 0 %   Basophils Absolute 0.0 0.0  - 0.1 K/uL  Basic metabolic panel  Result Value Ref Range   Sodium 135 135 - 145 mmol/L   Potassium 4.2 3.5 - 5.1 mmol/L   Chloride 107 101 - 111 mmol/L   CO2 22 22 - 32 mmol/L   Glucose, Bld 109 (H) 65 - 99 mg/dL   BUN 7 6 - 20 mg/dL   Creatinine, Ser 1.00 0.61 - 1.24 mg/dL   Calcium 8.9 8.9 - 10.3 mg/dL   GFR calc non Af Amer >60 >60 mL/min   GFR calc Af Amer >60 >60 mL/min   Anion gap 6 5 - 15  Urinalysis, Routine w reflex microscopic (not at [facility])  Result Value Ref Range   Color, Urine YELLOW YELLOW   APPearance CLEAR CLEAR   Specific Gravity, Urine >1.030 (H) 1.005 - 1.030   pH 6.0 5.0 - 8.0   Glucose, UA NEGATIVE NEGATIVE mg/dL   Hgb urine dipstick NEGATIVE NEGATIVE   Bilirubin Urine NEGATIVE NEGATIVE   Ketones, ur TRACE (A) NEGATIVE mg/dL   Protein, ur NEGATIVE NEGATIVE mg/dL   Nitrite NEGATIVE NEGATIVE   Leukocytes, UA NEGATIVE NEGATIVE  POC occult blood, ED RN will collect  Result Value Ref Range   Fecal Occult Bld POSITIVE (A) NEGATIVE   Laboratory interpretation all normal except Positive Hemoccult, improving anemia on iron treatment, concentrated urinalysis consistent with dehydration      Radiology No results found.  Procedures Procedures (including critical care time)  Medications Ordered in ED Medications  sodium chloride 0.9 % bolus 1,000 mL (0 mLs Intravenous Stopped 01/19/16 0133)  sodium chloride 0.9 % bolus  1,000 mL (0 mLs Intravenous Stopped 01/19/16 0207)  fentaNYL (SUBLIMAZE) injection 50 mcg (50 mcg Intravenous Given 01/19/16 0007)     Initial Impression / Assessment and Plan / ED Course  Rolland Porter, MD has reviewed the triage vital signs and the nursing notes.  Pertinent labs & imaging results that were available during my care of the patient were reviewed by me and considered in my medical decision making (see chart for details).  Clinical Course   11:53 PM- Pt advised of plan for treatment and pt agrees. Pt informed of his lab results.  He will receive IV fluids and Fentanyl IV.   Recheck 2:30 AM patient states he feels improved. His laboratory testing shows improvement of his anemia since he started his iron therapy.  His symptoms improved very easily tonight, therefore CT scan was not done despite having pain in a different location tonight.. At this point feel he can be discharged home. He already he has an appointment scheduled with his gastroenterologist on Monday, August 14.  Review of the [place] shows patient has had 3 narcotic prescriptions filled since March 24, he received #12 hydrocodone 5/325, #15 hydrocodone 5/325 on April 11, both from our ED, and #20 hydrocodone 5/325 on July 11 by his gastroenterologist. Marland Kitchen  Final Clinical Impressions(s) / ED Diagnoses   Final diagnoses:  Ulcerative colitis, acute, without complications ([place])  Rectal bleeding    New Prescriptions Discharge Medication List as of 01/19/2016  2:46 AM    START taking these medications   Details  traMADol (ULTRAM) 50 MG tablet Take 2 tablets (100 mg total) by mouth every 6 (six) hours as needed., Starting Sat 01/19/2016, Print        Plan discharge  Rolland Porter, MD, FACEP  I personally performed the services described in this documentation, which was scribed in my presence. The recorded information has been reviewed and considered.  Rolland Porter, MD, Barbette Or, MD 01/19/16 [phone]

## 2016-01-19 LAB — POC OCCULT BLOOD, ED: Fecal Occult Bld: POSITIVE — AB

## 2016-01-19 MED ORDER — TRAMADOL HCL 50 MG PO TABS: 100.0000 mg | ORAL_TABLET | Freq: Four times a day (QID) | ORAL | 0 refills | Status: DC | PRN

## 2016-01-19 NOTE — ED Notes (Signed)
MD at bedside. 

## 2016-01-19 NOTE — Discharge Instructions (Signed)
Drink plenty of fluids. Eat a bland diet, avoid fried, spicy or greasy foods. Take the tramadol with acetaminophen 1000 mg 4 times a day. Keep your appointment with Dr [name] on Monday, August 14th.  Return to the ED if you get a fever, get severe pain or have a lot of rectal bleeding.

## 2016-01-21 ENCOUNTER — Ambulatory Visit (INDEPENDENT_AMBULATORY_CARE_PROVIDER_SITE_OTHER): Payer: Medicaid Other | Admitting: Gastroenterology

## 2016-01-21 ENCOUNTER — Encounter: Payer: Self-pay | Admitting: Gastroenterology

## 2016-01-21 VITALS — BP 133/86 | HR 100 | Temp 98.0°F | Ht 67.5 in | Wt 232.0 lb

## 2016-01-21 DIAGNOSIS — D500 Iron deficiency anemia secondary to blood loss (chronic): Secondary | ICD-10-CM | POA: Diagnosis not present

## 2016-01-21 DIAGNOSIS — K51011 Ulcerative (chronic) pancolitis with rectal bleeding: Secondary | ICD-10-CM | POA: Diagnosis not present

## 2016-01-21 NOTE — Patient Instructions (Signed)
Continue the rectal foam for 2 more weeks then you may stop.  We will send you blood work to have done in 4 weeks to recheck iron levels. You may need another iron infusion.  I will talk to Dr. Eden Lathe about an additional medication with Remicade.  We will see you in 2 months!

## 2016-01-21 NOTE — Progress Notes (Addendum)
REVIEWED. [place]. CONSIDER ADDING IMURAN IF DISEASE NOT WELL CONTROLLED AFTER OCT 2017.  Referring Provider: Nolene Ebbs, MD Primary Care Physician:  [name], MD  Primary GI: Dr. [name]   Chief Complaint  Patient presents with  . Ulcerative Colitis    went to ER on Friday. Was in hospital 3 weeks ago    HPI:   Wayne Avila is a 25 y.o. male presenting today with a history of UC with pancolitis (biopsies with chronic inactive colitis and rectal biopsies with severe proctitis). Remicade first dose on December 27, 2015. Second dose 01/09/16. Was inpatient 7/21-7/24 for UC flare and improved with IV steroids, antibiotics, and given a steroid taper. History of IDA. Given dose of IV feraheme while inpatient. Recent Hgb improved to 12.5. He was seen in the ED on 8/11.   Has pain in abdomen 1-2 days after Remicade infusion, having to go to the ED. Next infusion on the 31st. No fever or chills. Heme positive in ED. Saw blood with wiping on Friday. Diarrhea over the weekend. Having loose stool off and on. Bleeding off and on. About 5-6 loose stools a day, which is improved. Foam enemas each evening, tolerating well.   Past Medical History:  Diagnosis Date  . Abdominal pain, chronic, right upper quadrant   . ADHD (attention deficit hyperactivity disorder)   . Anxiety   . Asthma   . Bipolar 1 disorder ([place])   . Colitis   . Depression   . GERD (gastroesophageal reflux disease)   . Hearing loss   . HOH (hard of hearing)   . Mental developmental delay 11/15/2012  . PONV (postoperative nausea and vomiting)   . Seizures ([place])     Past Surgical History:  Procedure Laterality Date  . ADENOIDECTOMY    . BIOPSY N/A 12/07/2012   Procedure: GASTRIC BIOPSIES;  Surgeon: [name], MD;  Location: [facility] ORS;  Service: Endoscopy;  Laterality: N/A;  . BIOPSY N/A 08/30/2013   Procedure: BIOPSY;  Surgeon: [name], MD;  Location: [facility] ORS;  Service: Endoscopy;   Laterality: N/A;  right colon,transverse colon, left colon, rectal biopsies  . BIOPSY  11/20/2015   Procedure: BIOPSY;  Surgeon: [name], MD;  Location: [facility] ENDO SUITE;  Service: Endoscopy;;  ileal, right colon biopsy, left colon, rectum  . COLONOSCOPY  11/20/2015   Dr. [name]: Severe erythema, edema, ulcers from the anal verge to 20 cm above the anal verge without mucosal sparing, remaining colon and terminal ileum appeared normal. Biopsies from the rectum revealed fulminant active chronic colitis consistent with IBD. Pathology from terminal ileum revealed intramucosal lymphoid aggregates. Remaining colon random biopsies with inactive chronic colitis  . COLONOSCOPY WITH PROPOFOL N/A 08/30/2013   QVZ:DGLOVF mucosa in the terminal ileum/COLITIS/ MILD PROCTITIS. 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  Marland Kitchen COLONOSCOPY WITH PROPOFOL N/A 11/20/2015   Dr. 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 ESOPHAGOGASTRODUODENOSCOPY  11/20/2015   Dr. [name]: Normal exam, stomach biopsied and duodenal biopsy.. Benign biopsies.  . ESOPHAGOGASTRODUODENOSCOPY (EGD) WITH PROPOFOL N/A 12/07/2012   SLF:The mucosa of the esophagus appeared normal Non-erosive gastritis (inflammation) was found in the gastric antrum; multiple biopsies The duodenal mucosa showed no abnormalities in the bulb and second portion of the duodenum  . ESOPHAGOGASTRODUODENOSCOPY (EGD) WITH PROPOFOL N/A 11/20/2015   Dr. [name]: normal with normal biopsies,  negative H.pylori   . TONSILLECTOMY      Current Outpatient Prescriptions  Medication Sig Dispense Refill  . albuterol (PROAIR HFA) 108 (90 BASE) MCG/ACT inhaler Inhale 2 puffs into the lungs every 4 (four) hours as needed for wheezing. Persistent dry cough 6.7 g 2  . albuterol (PROVENTIL) (2.5 MG/3ML) 0.083% nebulizer solution Inhale 3 mLs into  the lungs every 6 (six) hours as needed. Shortness of breath  11  . atomoxetine (STRATTERA) 25 MG capsule Take 25 mg by mouth daily. Takes with the 100 mg to make 125 mg daily    . Budesonide (UCERIS) 2 MG/ACT FOAM Place 1 application rectally 2 (two) times daily. For 2 weeks then every evening for 4 weeks 1 Can 3  . budesonide-formoterol (SYMBICORT) 160-4.5 MCG/ACT inhaler Inhale 2 puffs into the lungs 2 (two) times daily.    . divalproex (DEPAKOTE) 500 MG DR tablet Take 1 tablet (500 mg total) by mouth 2 (two) times daily. 60 tablet 2  . EPINEPHrine 0.3 mg/0.3 mL IJ SOAJ injection Inject 0.3 mg into the muscle once. Reported on 12/18/2015    . ferrous sulfate 325 (65 FE) MG tablet Take 1 tablet (325 mg total) by mouth 3 (three) times daily with meals. 90 tablet 0  . fluticasone (FLONASE) 50 MCG/ACT nasal spray Place 1 spray into both nostrils daily. (Patient taking differently: Place 1 spray into both nostrils daily as needed for allergies. )  5  . HYDROcodone-acetaminophen (NORCO) 5-325 MG tablet Take 1 tablet by mouth every 6 (six) hours as needed for moderate pain. 20 tablet 0  . loratadine (CLARITIN) 10 MG tablet Take 10 mg by mouth at bedtime.     . mesalamine (APRISO) 0.375 G 24 hr capsule Take 4 capsules (1.5 g total) by mouth daily. 124 capsule 11  . montelukast (SINGULAIR) 10 MG tablet Take 1 tablet (10 mg total) by mouth at bedtime. 30 tablet 3  . ranitidine (ZANTAC) 150 MG tablet Take 1 tablet (150 mg total) by mouth daily.    . sertraline (ZOLOFT) 100 MG tablet Take 1 tablet (100 mg total) by mouth daily. 30 tablet 0  . STRATTERA 100 MG capsule take one capsule by mouth once daily  5  . traMADol (ULTRAM) 50 MG tablet Take 2 tablets (100 mg total) by mouth every 6 (six) hours as needed. 16 tablet 0  . traZODone (DESYREL) 100 MG tablet Take one tablet by mouth once daily at bedtime  5  . hydrocortisone (CORTENEMA) 100 MG/60ML enema Place 1 enema (100 mg total) rectally at bedtime.  (Patient not taking: Reported on 12/28/2015) 30 enema 1  . metroNIDAZOLE (FLAGYL) 500 MG tablet Take 1 tablet (500 mg total) by mouth 3 (three) times daily. (Patient not taking: Reported on 01/21/2016) 15 tablet 0  . ondansetron (ZOFRAN ODT) 8 MG disintegrating tablet Take 1 tablet (8 mg total) by mouth every 8 (eight) hours as needed for nausea or vomiting. (Patient not taking: Reported on 01/21/2016) 10 tablet 0  . predniSONE (DELTASONE) 10 MG tablet Take 3 tablets (30 mg total) by mouth daily. (Patient not taking: Reported on 01/21/2016) 20 tablet 0   No current facility-administered medications for this visit.     Allergies as of 01/21/2016 - Review Complete 01/21/2016  Allergen Reaction Noted  . Amoxicillin-pot clavulanate Nausea And Vomiting 02/28/2011  . Ibuprofen Other (See Comments) 07/03/2015  . Omeprazole Nausea And Vomiting 04/20/2013  . Pineapple Swelling 04/14/2011  . Strawberry extract Swelling 05/08/2012  .  Tomato Rash 04/14/2011    Family History  Problem Relation Age of Onset  . Asthma Mother   . Ulcers Mother   . Bipolar disorder Mother   . ADD / ADHD Father   . Diabetes Maternal Grandmother   . Diabetes Maternal Grandfather   . Colon cancer Neg Hx   . Liver disease Neg Hx     Social History   Social History  . Marital status: Single    Spouse name: N/A  . Number of children: 0  . Years of education: 12th   Occupational History  .  Not Employed    not working   Social History Main Topics  . Smoking status: Former Smoker    Packs/day: 0.00    Years: 2.00    Quit date: 10/06/2012  . Smokeless tobacco: Never Used     Comment: Never smoked cigarettes  . Alcohol use No     Comment: denies usage  . Drug use: No  . Sexual activity: Yes   Other Topics Concern  . None   Social History Narrative   Patient lives at home with girlfriend and family.   Caffeine Use: occasionally    Review of Systems: As mentioned in HPI.   Physical Exam: BP 133/86    Pulse 100   Temp 98 F (36.7 C) (Oral)   Ht 5' 7.5" (1.715 m)   Wt 232 lb (105.2 kg)   BMI 35.80 kg/m  General:   Alert and oriented. No distress noted. Pleasant and cooperative.  Head:  Normocephalic and atraumatic. Eyes:  Conjuctiva clear without scleral icterus. Abdomen:  +BS, soft, non-tender and non-distended. No rebound or guarding. No HSM or masses noted. Extremities:  Without edema. Neurologic:  Alert and  oriented x4;  grossly normal neurologically. Psych:  Alert and cooperative. Normal mood and affect.

## 2016-01-22 ENCOUNTER — Other Ambulatory Visit: Payer: Self-pay

## 2016-01-22 ENCOUNTER — Telehealth: Payer: Self-pay

## 2016-01-22 DIAGNOSIS — R197 Diarrhea, unspecified: Secondary | ICD-10-CM

## 2016-01-22 MED ORDER — HYOSCYAMINE SULFATE 0.125 MG SL SUBL: 0.1250 mg | SUBLINGUAL_TABLET | SUBLINGUAL | 0 refills | Status: DC | PRN

## 2016-01-22 NOTE — Telephone Encounter (Signed)
Pt's mom called and said pt is having abdominal pain real bad and diarrhea.  He was seen by Laban Emperor, NP in the office yesterday and pt's mom said she told him to call her for an appointment to come in if he had any abdominal pain to try to avoid the ED.  I told her I will check with Vicente Males who is with a patient and let her know.

## 2016-01-22 NOTE — Telephone Encounter (Signed)
PT's mom is aware and they will come by the office for the stool containers/orders.

## 2016-01-22 NOTE — Telephone Encounter (Signed)
I have sent in a prescription for Levsin for abdominal cramping.  Let's check stool studies for (cdiff, stool culture).   I will be talking with Dr. [name] about adding Imuran in addition to Remicade.

## 2016-01-23 ENCOUNTER — Other Ambulatory Visit: Payer: Self-pay | Admitting: Gastroenterology

## 2016-01-24 ENCOUNTER — Telehealth: Payer: Self-pay

## 2016-01-24 LAB — CLOSTRIDIUM DIFFICILE BY PCR: Toxigenic C. Difficile by PCR: DETECTED — CR

## 2016-01-24 MED ORDER — METRONIDAZOLE 500 MG PO TABS
500.0000 mg | ORAL_TABLET | Freq: Three times a day (TID) | ORAL | 0 refills | Status: AC
Start: 2016-01-24 — End: 2016-02-07

## 2016-01-24 NOTE — Telephone Encounter (Signed)
We will follow his clinical response with Remicade and consider Imuran in October if needed after review with Dr. [name].

## 2016-01-24 NOTE — Telephone Encounter (Signed)
Please see result note. Patient to call if no improvement in next 24 hours.

## 2016-01-24 NOTE — Progress Notes (Signed)
Cdiff PCR positive. I had thought that the Cdiff orders checked the toxin and antigen now, but this will be ok. We will treat regardless, because clinically he has had an increase in diarrhea, is immunocompromised, and he has had an exposure to antibiotics recently. I am sending in Flagyl to take 500 mg po TID for 14 days. IF HE HAS NO IMPROVEMENT in next 24 hours he needs to contact us. Use contact precautions. Sanitize with bleach.

## 2016-01-24 NOTE — Assessment & Plan Note (Signed)
With severe proctitis, starting Remicade July 20th and third dose on the 31st scheduled. Multiple ED visits noted, recent brief hospital admission noted. Off of steroid taper. Hgb much improved after iron infusion. History of chronic abdominal pain. Today, much improved and overall seems to be improving clinically. Per Dr. [name]' addendum, will continue Remicade and continue Uceris rectal foam for an additional 2 weeks and consider adding Imuran if disease is not well controlled after October 2017. Check iron levels in 4 weeks. May need additional IV iron if still with iron deficit. Return in 2 months and discuss Imuran at that time if necessary.

## 2016-01-24 NOTE — Progress Notes (Signed)
PT's mom is aware of recommendations.

## 2016-01-24 NOTE — Progress Notes (Signed)
CC'ED TO PCP 

## 2016-01-24 NOTE — Telephone Encounter (Signed)
Noted  

## 2016-01-24 NOTE — Telephone Encounter (Signed)
Received a call from [place] at [place] lab. Pt is positive for cdiff.

## 2016-01-27 ENCOUNTER — Emergency Department (HOSPITAL_COMMUNITY): Payer: Medicaid Other

## 2016-01-27 ENCOUNTER — Emergency Department (HOSPITAL_COMMUNITY)
Admission: EM | Admit: 2016-01-27 | Discharge: 2016-01-27 | Disposition: A | Discharge status: Home / Self Care | Payer: Medicaid Other | Attending: Emergency Medicine | Admitting: Emergency Medicine

## 2016-01-27 ENCOUNTER — Encounter (HOSPITAL_COMMUNITY): Payer: Self-pay | Admitting: Emergency Medicine

## 2016-01-27 DIAGNOSIS — J45909 Unspecified asthma, uncomplicated: Secondary | ICD-10-CM | POA: Diagnosis not present

## 2016-01-27 DIAGNOSIS — R109 Unspecified abdominal pain: Secondary | ICD-10-CM

## 2016-01-27 DIAGNOSIS — R197 Diarrhea, unspecified: Secondary | ICD-10-CM | POA: Diagnosis not present

## 2016-01-27 DIAGNOSIS — R1031 Right lower quadrant pain: Secondary | ICD-10-CM | POA: Diagnosis not present

## 2016-01-27 DIAGNOSIS — Z79899 Other long term (current) drug therapy: Secondary | ICD-10-CM | POA: Diagnosis not present

## 2016-01-27 DIAGNOSIS — R1011 Right upper quadrant pain: Secondary | ICD-10-CM | POA: Insufficient documentation

## 2016-01-27 DIAGNOSIS — Z87891 Personal history of nicotine dependence: Secondary | ICD-10-CM | POA: Insufficient documentation

## 2016-01-27 DIAGNOSIS — R1110 Vomiting, unspecified: Secondary | ICD-10-CM | POA: Insufficient documentation

## 2016-01-27 LAB — COMPREHENSIVE METABOLIC PANEL
ALT: 16 U/L — ABNORMAL LOW (ref 17–63)
AST: 22 U/L (ref 15–41)
Albumin: 3.9 g/dL (ref 3.5–5.0)
Alkaline Phosphatase: 44 U/L (ref 38–126)
Anion gap: 6 (ref 5–15)
BUN: 9 mg/dL (ref 6–20)
CO2: 22 mmol/L (ref 22–32)
Calcium: 8.7 mg/dL — ABNORMAL LOW (ref 8.9–10.3)
Chloride: 109 mmol/L (ref 101–111)
Creatinine, Ser: 0.97 mg/dL (ref 0.61–1.24)
GFR calc Af Amer: >60 mL/min (ref >60)
GFR calc non Af Amer: >60 mL/min (ref >60)
Glucose, Bld: 117 mg/dL — ABNORMAL HIGH (ref 65–99)
Potassium: 3.6 mmol/L (ref 3.5–5.1)
Sodium: 137 mmol/L (ref 135–145)
Total Bilirubin: 0.3 mg/dL (ref 0.3–1.2)
Total Protein: 6.6 g/dL (ref 6.5–8.1)

## 2016-01-27 LAB — CBC WITH DIFFERENTIAL/PLATELET
Basophils Absolute: 0 10*3/uL (ref 0.0–0.1)
Basophils Relative: 0 %
Eosinophils Absolute: 0.2 10*3/uL (ref 0.0–0.7)
Eosinophils Relative: 5 %
HCT: 39 % (ref 39.0–52.0)
Hemoglobin: 12.8 g/dL — ABNORMAL LOW (ref 13.0–17.0)
Lymphocytes Relative: 28 %
Lymphs Abs: 1.4 10*3/uL (ref 0.7–4.0)
MCH: 27.8 pg (ref 26.0–34.0)
MCHC: 32.8 g/dL (ref 30.0–36.0)
MCV: 84.6 fL (ref 78.0–100.0)
Monocytes Absolute: 0.7 10*3/uL (ref 0.1–1.0)
Monocytes Relative: 14 %
Neutro Abs: 2.6 10*3/uL (ref 1.7–7.7)
Neutrophils Relative %: 53 %
Platelets: 230 10*3/uL (ref 150–400)
RBC: 4.61 MIL/uL (ref 4.22–5.81)
RDW: 18.6 % — ABNORMAL HIGH (ref 11.5–15.5)
WBC: 5 10*3/uL (ref 4.0–10.5)

## 2016-01-27 LAB — STOOL CULTURE

## 2016-01-27 LAB — LIPASE, BLOOD: Lipase: 22 U/L (ref 11–51)

## 2016-01-27 MED ORDER — IOPAMIDOL (ISOVUE-300) INJECTION 61%
100.0000 mL | Freq: Once | INTRAVENOUS | Status: AC | PRN
  Administered 2016-01-27: 100 mL via INTRAVENOUS

## 2016-01-27 MED ORDER — FENTANYL CITRATE (PF) 100 MCG/2ML IJ SOLN
50.0000 ug | Freq: Once | INTRAMUSCULAR | Status: AC
  Administered 2016-01-27: 50 ug via INTRAVENOUS
  Filled 2016-01-27: qty 2

## 2016-01-27 MED ORDER — SODIUM CHLORIDE 0.9 % IV BOLUS (SEPSIS)
1000.0000 mL | Freq: Once | INTRAVENOUS | Status: AC
  Administered 2016-01-27: 1000 mL via INTRAVENOUS

## 2016-01-27 NOTE — ED Triage Notes (Signed)
Pt reports abd pain in RUQ x1 week.  Pt was diagnosed with c-diff last week, currently taking flagyl.  Pt also continues to have diarrhea and emesis.

## 2016-01-27 NOTE — ED Notes (Signed)
When asked questions, pt will not speak and keeps looking to family member in room to answer questions.  Pt with diarrrhea x 3 today and recently dx with C-diff on Thursday per family.  Pt not helpful in answering if he took vicodin or not.

## 2016-01-27 NOTE — ED Provider Notes (Signed)
[facility] DEPT Provider Note   CSN: [id number] Arrival date & time: 01/27/16  1338     History   Chief Complaint Chief Complaint  Patient presents with  . Abdominal Pain    HPI Wayne Avila is a 25 y.o. male with a history of ulcerative colitis presenting with right-sided abdominal pain. Patient states that he's been having pain on the side intermittently for the last 3 weeks. Pain was worse this morning and so he did not go to church. Texted mom while in charge that his pain was having even worse and so she brought him in to the hospital. No current nausea but vomited last night. Pain is sharp. Last time he had this it was from colitis. He was recently diagnosed with C. difficile by his gastroenterologist and placed on Flagyl. He has been on this for the last 4 days. Prior to starting Remicade he used to have about 10 bowel movements per day, now is down to about 3 or 5. This is unchanged with Flagyl. Most recent time he had blood in his stool was 3 days ago. Is not quite having diarrhea but just having soft stool. No fevers or back pain. No urinary symptoms. Has not taken anything today for pain.  HPI  Past Medical History:  Diagnosis Date  . Abdominal pain, chronic, right upper quadrant   . ADHD (attention deficit hyperactivity disorder)   . Anxiety   . Asthma   . Bipolar 1 disorder ([place])   . Colitis   . Depression   . GERD (gastroesophageal reflux disease)   . Hearing loss   . HOH (hard of hearing)   . Mental developmental delay 11/15/2012  . PONV (postoperative nausea and vomiting)   . Seizures [facility])     Patient Active Problem List   Diagnosis Date Noted  . Ulcerative colitis ([place]) 12/29/2015  . CAP (community acquired pneumonia)   . 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 Acute colitis 11/21/2015  . Aspiration pneumonitis ([place]) 11/21/2015  . Chronic blood loss anemia 11/21/2015  . Bipolar I disorder, most recent episode depressed ([place])   . Bipolar 1 disorder, manic, moderate  ([place]) 11/09/2014  . Suicidal ideation   . Major depressive disorder, recurrent episode, moderate ([place])   . Oral candida 02/23/2014  . UC (ulcerative colitis) ([place]) 10/25/2013  . Abdominal pain, acute 09/20/2013  . Rectal bleeding 08/09/2013  . Unintentional weight loss 08/09/2013  . RUQ pain 08/09/2013  . Unspecified gastritis and gastroduodenitis without mention of hemorrhage 04/20/2013  . Upper abdominal pain 12/01/2012  . Nausea with vomiting 12/01/2012  . Mental developmental delay 11/15/2012    Past Surgical History:  Procedure Laterality Date  . ADENOIDECTOMY    . BIOPSY N/A 12/07/2012   Procedure: GASTRIC BIOPSIES;  Surgeon: [name], MD;  Location: [facility] ORS;  Service: Endoscopy;  Laterality: N/A;  . BIOPSY N/A 08/30/2013   Procedure: BIOPSY;  Surgeon: [name], MD;  Location: [facility] ORS;  Service: Endoscopy;  Laterality: N/A;  right colon,transverse colon, left colon, rectal biopsies  . BIOPSY  11/20/2015   Procedure: BIOPSY;  Surgeon: [name], MD;  Location: [facility] ENDO SUITE;  Service: Endoscopy;;  ileal, right colon biopsy, left colon, rectum  . COLONOSCOPY  11/20/2015   Dr. [name]: Severe erythema, edema, ulcers from the anal verge to 20 cm above the anal verge without mucosal sparing, remaining colon and terminal ileum appeared normal. Biopsies from the rectum revealed fulminant active chronic colitis consistent with IBD. Pathology  from terminal ileum revealed intramucosal lymphoid aggregates. Remaining colon random biopsies with inactive chronic colitis  . COLONOSCOPY WITH PROPOFOL N/A 08/30/2013   PPJ:KDTOIZ mucosa in the terminal ileum/COLITIS/ MILD PROCTITIS. 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  Marland Kitchen COLONOSCOPY WITH PROPOFOL N/A 11/20/2015   Dr. 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 ESOPHAGOGASTRODUODENOSCOPY   11/20/2015   Dr. [name]: Normal exam, stomach biopsied and duodenal biopsy.. Benign biopsies.  . ESOPHAGOGASTRODUODENOSCOPY (EGD) WITH PROPOFOL N/A 12/07/2012   SLF:The mucosa of the esophagus appeared normal Non-erosive gastritis (inflammation) was found in the gastric antrum; multiple biopsies The duodenal mucosa showed no abnormalities in the bulb and second portion of the duodenum  . ESOPHAGOGASTRODUODENOSCOPY (EGD) WITH PROPOFOL N/A 11/20/2015   Dr. [name]: normal with normal biopsies, negative H.pylori   . TONSILLECTOMY         Home Medications    Prior to Admission medications   Medication Sig Start Date End Date Taking? Authorizing Provider  albuterol (PROAIR HFA) 108 (90 BASE) MCG/ACT inhaler Inhale 2 puffs into the lungs every 4 (four) hours as needed for wheezing. Persistent dry cough 09/21/13   Sandi Mealy, MD  albuterol (PROVENTIL) (2.5 MG/3ML) 0.083% nebulizer solution Inhale 3 mLs into the lungs every 6 (six) hours as needed. Shortness of breath 11/08/15   Historical Provider, MD  atomoxetine (STRATTERA) 25 MG capsule Take 25 mg by mouth daily. Takes with the 100 mg to make 125 mg daily    Historical Provider, MD  Budesonide (UCERIS) 2 MG/ACT FOAM Place 1 application rectally 2 (two) times daily. For 2 weeks then every evening for 4 weeks 12/18/15   [name], NP  budesonide-formoterol [facility]) 160-4.5 MCG/ACT inhaler Inhale 2 puffs into the lungs 2 (two) times daily.    Historical Provider, MD  divalproex (DEPAKOTE) 500 MG DR tablet Take 1 tablet (500 mg total) by mouth 2 (two) times daily. 07/03/15   Penni Bombard, MD  EPINEPHrine 0.3 mg/0.3 mL IJ SOAJ injection Inject 0.3 mg into the muscle once. Reported on 12/18/2015    Historical Provider, MD  ferrous sulfate 325 (65 FE) MG tablet Take 1 tablet (325 mg total) by mouth 3 (three) times daily with meals. 12/31/15   Thurnell Lose, MD  fluticasone (FLONASE) 50 MCG/ACT nasal spray Place 1 spray into both nostrils  daily. Patient taking differently: Place 1 spray into both nostrils daily as needed for allergies.  11/13/14   [name], NP  HYDROcodone-acetaminophen (NORCO) 5-325 MG tablet Take 1 tablet by mouth every 6 (six) hours as needed for moderate pain. 12/18/15   [name], NP  hyoscyamine (LEVSIN SL) 0.125 MG SL tablet Place 1 tablet (0.125 mg total) under the tongue every 4 (four) hours as needed. 01/22/16   [name], NP  loratadine (CLARITIN) 10 MG tablet Take 10 mg by mouth at bedtime.     Historical Provider, MD  mesalamine (APRISO) 0.375 G 24 hr capsule Take 4 capsules (1.5 g total) by mouth daily. 11/13/14   [name], NP  metroNIDAZOLE (FLAGYL) 500 MG tablet Take 1 tablet (500 mg total) by mouth 3 (three) times daily. 01/24/16 02/07/16  [name], NP  montelukast (SINGULAIR) 10 MG tablet Take 1 tablet (10 mg total) by mouth at bedtime. 11/13/14   [name], NP  ondansetron (ZOFRAN ODT) 8 MG disintegrating tablet  Take 1 tablet (8 mg total) by mouth every 8 (eight) hours as needed for nausea or vomiting. Patient not taking: Reported on 01/21/2016 12/25/15   Jola Schmidt, MD  predniSONE (DELTASONE) 10 MG tablet Take 3 tablets (30 mg total) by mouth daily. Patient not taking: Reported on 01/21/2016 12/31/15   Thurnell Lose, MD  ranitidine (ZANTAC) 150 MG tablet Take 1 tablet (150 mg total) by mouth daily. 11/13/14   [name], NP  sertraline (ZOLOFT) 100 MG tablet Take 1 tablet (100 mg total) by mouth daily. 11/13/14   [name], NP  STRATTERA 100 MG capsule take one capsule by mouth once daily 08/09/15   Historical Provider, MD  traMADol (ULTRAM) 50 MG tablet Take 2 tablets (100 mg total) by mouth every 6 (six) hours as needed. 01/19/16   Rolland Porter, MD  traZODone (DESYREL) 100 MG tablet Take one tablet by mouth once daily at bedtime 08/07/15   Historical Provider, MD    Family History Family History  Problem Relation Age of Onset  . Asthma Mother   . Ulcers Mother   . Bipolar  disorder Mother   . ADD / ADHD Father   . Diabetes Maternal Grandmother   . Diabetes Maternal Grandfather   . Colon cancer Neg Hx   . Liver disease Neg Hx     Social History Social History  Substance Use Topics  . Smoking status: Former Smoker    Packs/day: 0.00    Years: 2.00    Quit date: 10/06/2012  . Smokeless tobacco: Never Used     Comment: Never smoked cigarettes  . Alcohol use No     Comment: denies usage     Allergies   Amoxicillin-pot clavulanate; Ibuprofen; Omeprazole; Pineapple; Strawberry extract; and Tomato   Review of Systems Review of Systems  Constitutional: Negative for fever.  Gastrointestinal: Positive for abdominal pain, diarrhea and vomiting. Negative for blood in stool (last was 3 days ago) and nausea.  Genitourinary: Negative for dysuria.  Musculoskeletal: Negative for back pain.  All other systems reviewed and are negative.    Physical Exam Updated Vital Signs BP 120/88 (BP Location: Left Arm)   Pulse 100   Temp 98.8 F (37.1 C) (Oral)   Resp 18   Ht 5' 7"  (1.702 m)   Wt 232 lb (105.2 kg)   SpO2 100%   BMI 36.34 kg/m   Physical Exam  Constitutional: He is oriented to person, place, and time. He appears well-developed and well-nourished.  HENT:  Head: Normocephalic and atraumatic.  Right Ear: External ear normal.  Left Ear: External ear normal.  Nose: Nose normal.  Eyes: Right eye exhibits no discharge. Left eye exhibits no discharge.  Neck: Neck supple.  Cardiovascular: Normal rate.   Pulmonary/Chest: Effort normal.  Abdominal: Soft. He exhibits no distension and no mass. There is tenderness in the right upper quadrant and right lower quadrant. There is no rebound and no guarding.  Musculoskeletal: He exhibits no edema.  Neurological: He is alert and oriented to person, place, and time.  Skin: Skin is warm and dry.  Nursing note and vitals reviewed.    ED Treatments / Results  Labs (all labs ordered are listed, but only  abnormal results are displayed) Labs Reviewed  CBC WITH DIFFERENTIAL/PLATELET - Abnormal; Notable for the following:       Result Value   Hemoglobin 12.8 (*)    RDW 18.6 (*)    All other components within normal limits  COMPREHENSIVE METABOLIC PANEL - Abnormal; Notable for the following:    Glucose, Bld 117 (*)    Calcium 8.7 (*)    ALT 16 (*)    All other components within normal limits  LIPASE, BLOOD  URINALYSIS, ROUTINE W REFLEX MICROSCOPIC (NOT AT [facility])    EKG  EKG Interpretation None       Radiology Ct Abdomen Pelvis W Contrast  Result Date: 01/27/2016 CLINICAL DATA:  Right upper quadrant pain for 1 week. Recent Clostridium difficile colitis. History of ulcerative proctitis EXAM: CT ABDOMEN AND PELVIS WITH CONTRAST TECHNIQUE: Multidetector CT imaging of the abdomen and pelvis was performed using the standard protocol following bolus administration of intravenous contrast. Oral contrast was also administered. CONTRAST:  141m ISOVUE-300 IOPAMIDOL (ISOVUE-300) INJECTION 61% COMPARISON:  November 21, 2015 FINDINGS: Lower chest:  Lung bases are clear. Hepatobiliary: No focal liver lesions are identified. Gallbladder wall is not appreciably thickened. There is no biliary duct dilatation. Pancreas: No pancreatic mass or inflammatory focus. Spleen: No splenic lesions evident. Adrenals/Urinary Tract: Adrenals appear unremarkable bilaterally. Kidneys bilaterally show no demonstrable mass or hydronephrosis on either side. There is no renal or ureteral calculus on either side. Urinary bladder is midline with wall thickness within normal limits. Stomach/Bowel: There is mild soft tissue stranding in the perirectal region. There is rather equivocal rectal wall thickening. There is a small lymph node just to the left of the rectum measuring 7 x 5 mm. No bowel wall thickening or mesenteric thickening/ stranding is seen elsewhere. There is no demonstrable bowel obstruction. No free air or portal venous air  evident. Vascular/Lymphatic: There is no abdominal aortic aneurysm. No vascular lesions are evident. There is no adenopathy in the abdomen or pelvis by size criteria. Reproductive: Prostate and seminal vesicles appear normal. There is no pelvic mass or pelvic fluid collection. Other: Appendix appears normal. There is no ascites or abscess in the abdomen or pelvis. Musculoskeletal: There are no blastic or lytic bone lesions. There is no intramuscular or abdominal wall lesion. IMPRESSION: Mild perirectal stranding with a small lymph node to the left of the rectum. There is rather equivocal rectal wall thickening. Question a degree of proctitis in this area. Elsewhere no bowel wall or mesenteric thickening. No bowel obstruction. No abscess. Appendix appears normal. No gallbladder wall thickening identified evident by CT. No renal or ureteral calculus.  No hydronephrosis. Electronically Signed   By: WLowella GripIII M.D.   On: 01/27/2016 15:38    Procedures Procedures (including critical care time)  Medications Ordered in ED Medications  sodium chloride 0.9 % bolus 1,000 mL (1,000 mLs Intravenous New Bag/Given 01/27/16 1433)  fentaNYL (SUBLIMAZE) injection 50 mcg (50 mcg Intravenous Given 01/27/16 1433)  iopamidol (ISOVUE-300) 61 % injection 100 mL (100 mLs Intravenous Contrast Given 01/27/16 1446)     Initial Impression / Assessment and Plan / ED Course  I have reviewed the triage vital signs and the nursing notes.  Pertinent labs & imaging results that were available during my care of the patient were reviewed by me and considered in my medical decision making (see chart for details).  Clinical Course  Comment By Time  Will check CT given focal Right sided tenderness. Unclear if this is UC related or other abdominal issue.  SSherwood Gambler MD 08/20 1423  Patient's pain is significantly improved. CT shows mild proctitis but this is not really consistent with his presentation. He was essentially  diagnosed with proctitis by his gastroenterologist last week. Given that  his blood in stool was improving I think that this is likely on the mend. White blood cell count is normal. At this point will have him call his gastrologist tomorrow for close follow-up and further instructions. Discussed strict return precautions. Sherwood Gambler, MD 08/20 1607    Final Clinical Impressions(s) / ED Diagnoses   Final diagnoses:  Right sided abdominal pain    New Prescriptions New Prescriptions   No medications on file     Sherwood Gambler, MD 01/27/16 [phone]

## 2016-01-27 NOTE — ED Notes (Signed)
Patient transported to CT 

## 2016-01-28 ENCOUNTER — Telehealth: Payer: Self-pay

## 2016-01-28 NOTE — Telephone Encounter (Signed)
He has a known history of severe proctitis. No steroids needed. He has Cdiff.   How many loose stools is he having? What was his fever? No leukocytosis noted.

## 2016-01-28 NOTE — Telephone Encounter (Signed)
Pt's mom called and said pt was seen at the ED yesterday and diagnosed with proctitis.  She said they were told to call GI and follow up.  She wants to know if he needs steroids.  She said that he had a fever last night.   Please advise!

## 2016-01-29 NOTE — Telephone Encounter (Signed)
PT's mom called and he has anywhere from 3 -5-7  Loose stools daily, no diarrhea. He has not had any more fever.

## 2016-01-29 NOTE — Telephone Encounter (Signed)
PT's mom is aware.

## 2016-01-29 NOTE — Telephone Encounter (Signed)
As long as stools are improving in consistency and frequency, abdominal pain improving and no fever, we will continue the course of Flagyl. Will hold off on vancomycin. If he does not improve, I would transition to vancomycin.

## 2016-01-29 NOTE — Telephone Encounter (Signed)
LMOM for a return call.  

## 2016-02-03 ENCOUNTER — Encounter (HOSPITAL_COMMUNITY): Payer: Self-pay | Admitting: Emergency Medicine

## 2016-02-03 ENCOUNTER — Emergency Department (HOSPITAL_COMMUNITY)
Admission: EM | Admit: 2016-02-03 | Discharge: 2016-02-03 | Disposition: A | Discharge status: Home / Self Care | Payer: Medicaid Other | Attending: Emergency Medicine | Admitting: Emergency Medicine

## 2016-02-03 DIAGNOSIS — S60562A Insect bite (nonvenomous) of left hand, initial encounter: Secondary | ICD-10-CM | POA: Diagnosis not present

## 2016-02-03 DIAGNOSIS — Y9384 Activity, sleeping: Secondary | ICD-10-CM | POA: Diagnosis not present

## 2016-02-03 DIAGNOSIS — Y999 Unspecified external cause status: Secondary | ICD-10-CM | POA: Insufficient documentation

## 2016-02-03 DIAGNOSIS — F909 Attention-deficit hyperactivity disorder, unspecified type: Secondary | ICD-10-CM | POA: Diagnosis not present

## 2016-02-03 DIAGNOSIS — Y929 Unspecified place or not applicable: Secondary | ICD-10-CM | POA: Insufficient documentation

## 2016-02-03 DIAGNOSIS — J45909 Unspecified asthma, uncomplicated: Secondary | ICD-10-CM | POA: Insufficient documentation

## 2016-02-03 DIAGNOSIS — Z87891 Personal history of nicotine dependence: Secondary | ICD-10-CM | POA: Insufficient documentation

## 2016-02-03 DIAGNOSIS — W57XXXA Bitten or stung by nonvenomous insect and other nonvenomous arthropods, initial encounter: Secondary | ICD-10-CM | POA: Insufficient documentation

## 2016-02-03 MED ORDER — PREDNISONE 20 MG PO TABS
40.0000 mg | ORAL_TABLET | Freq: Once | ORAL | Status: AC
  Administered 2016-02-03: 40 mg via ORAL
  Filled 2016-02-03: qty 2

## 2016-02-03 MED ORDER — DEXAMETHASONE 4 MG PO TABS: 4.0000 mg | ORAL_TABLET | Freq: Two times a day (BID) | ORAL | 0 refills | Status: DC

## 2016-02-03 MED ORDER — HYDROXYZINE PAMOATE 25 MG PO CAPS: 25.0000 mg | ORAL_CAPSULE | Freq: Three times a day (TID) | ORAL | 0 refills | Status: DC | PRN

## 2016-02-03 MED ORDER — TRIAMCINOLONE ACETONIDE 0.1 % EX CREA: 1.0000 "application " | TOPICAL_CREAM | Freq: Two times a day (BID) | CUTANEOUS | 1 refills | Status: DC

## 2016-02-03 MED ORDER — HYDROXYZINE HCL 25 MG PO TABS
25.0000 mg | ORAL_TABLET | Freq: Once | ORAL | Status: AC
  Administered 2016-02-03: 25 mg via ORAL
  Filled 2016-02-03: qty 1

## 2016-02-03 NOTE — ED Triage Notes (Signed)
Pt has woke up with mosquito bites and they itch.

## 2016-02-03 NOTE — Discharge Instructions (Signed)
Please use triamcinolone cream to the bite areas morning and bedtime. Use Decadron 2 times daily with food until all taken. Use Vistaril for severe itching. Vistaril may cause drowsiness, please do not drive, operate machinery, drink alcohol, or participate in activities requiring concentration when taking this medication.

## 2016-02-03 NOTE — ED Provider Notes (Signed)
[facility] DEPT Provider Note   CSN: [id number] Arrival date & time: 02/03/16  1648  By signing my name below, I, Dora Sims, attest that this documentation has been prepared under the direction and in the presence of Lily Kocher, PA-C. Electronically Signed: Dora Sims, Scribe. 02/03/2016. 5:26 PM.  History   Chief Complaint Chief Complaint  Patient presents with  . Insect Bite    The history is provided by the patient. No language interpreter was used.     HPI Comments: Wayne Avila is a 25 y.o. male who presents to the Emergency Department complaining of sudden onset insect bites sustained to his left hand several hours PTA. Pt reports he was sleeping and woke up with insect bites on his left hand as well as his left upper arm. He does not know what bit him but believes it may have been a mosquito. He endorses associated pruritis and swelling. Pt states he was been eating and drinking normally since onset. He notes allergies to amoxicillin to Augmentin (they cause nausea and vomiting). He denies trouble swallowing, SOB, or any other associated symptoms.  Past Medical History:  Diagnosis Date  . Abdominal pain, chronic, right upper quadrant   . ADHD (attention deficit hyperactivity disorder)   . Anxiety   . Asthma   . Bipolar 1 disorder ([place])   . Colitis   . Depression   . GERD (gastroesophageal reflux disease)   . Hearing loss   . HOH (hard of hearing)   . Mental developmental delay 11/15/2012  . PONV (postoperative nausea and vomiting)   . Seizures [facility])     Patient Active Problem List   Diagnosis Date Noted  . Ulcerative colitis ([place]) 12/29/2015  . CAP (community acquired pneumonia)   . 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 Acute colitis 11/21/2015  . Aspiration pneumonitis ([place]) 11/21/2015  . Chronic blood loss anemia 11/21/2015  . Bipolar I disorder, most recent episode depressed ([place])   . Bipolar 1 disorder, manic, moderate ([place]) 11/09/2014  . Suicidal ideation     . Major depressive disorder, recurrent episode, moderate ([place])   . Oral candida 02/23/2014  . UC (ulcerative colitis) ([place]) 10/25/2013  . Abdominal pain, acute 09/20/2013  . Rectal bleeding 08/09/2013  . Unintentional weight loss 08/09/2013  . RUQ pain 08/09/2013  . Unspecified gastritis and gastroduodenitis without mention of hemorrhage 04/20/2013  . Upper abdominal pain 12/01/2012  . Nausea with vomiting 12/01/2012  . Mental developmental delay 11/15/2012    Past Surgical History:  Procedure Laterality Date  . ADENOIDECTOMY    . BIOPSY N/A 12/07/2012   Procedure: GASTRIC BIOPSIES;  Surgeon: [name], MD;  Location: [facility] ORS;  Service: Endoscopy;  Laterality: N/A;  . BIOPSY N/A 08/30/2013   Procedure: BIOPSY;  Surgeon: [name], MD;  Location: [facility] ORS;  Service: Endoscopy;  Laterality: N/A;  right colon,transverse colon, left colon, rectal biopsies  . BIOPSY  11/20/2015   Procedure: BIOPSY;  Surgeon: [name], MD;  Location: [facility] ENDO SUITE;  Service: Endoscopy;;  ileal, right colon biopsy, left colon, rectum  . COLONOSCOPY  11/20/2015   Dr. [name]: Severe erythema, edema, ulcers from the anal verge to 20 cm above the anal verge without mucosal sparing, remaining colon and terminal ileum appeared normal. Biopsies from the rectum revealed fulminant active chronic colitis consistent with IBD. Pathology from terminal ileum revealed intramucosal lymphoid aggregates. Remaining colon random biopsies with inactive chronic colitis  . COLONOSCOPY WITH PROPOFOL N/A 08/30/2013   WSF:KCLEXN  mucosa in the terminal ileum/COLITIS/ MILD PROCTITIS. 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  Marland Kitchen COLONOSCOPY WITH PROPOFOL N/A 11/20/2015   Dr. 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 ESOPHAGOGASTRODUODENOSCOPY  11/20/2015   Dr. [name]: Normal exam,  stomach biopsied and duodenal biopsy.. Benign biopsies.  . ESOPHAGOGASTRODUODENOSCOPY (EGD) WITH PROPOFOL N/A 12/07/2012   SLF:The mucosa of the esophagus appeared normal Non-erosive gastritis (inflammation) was found in the gastric antrum; multiple biopsies The duodenal mucosa showed no abnormalities in the bulb and second portion of the duodenum  . ESOPHAGOGASTRODUODENOSCOPY (EGD) WITH PROPOFOL N/A 11/20/2015   Dr. [name]: normal with normal biopsies, negative H.pylori   . TONSILLECTOMY         Home Medications    Prior to Admission medications   Medication Sig Start Date End Date Taking? Authorizing Provider  albuterol (PROAIR HFA) 108 (90 BASE) MCG/ACT inhaler Inhale 2 puffs into the lungs every 4 (four) hours as needed for wheezing. Persistent dry cough 09/21/13   Sandi Mealy, MD  albuterol (PROVENTIL) (2.5 MG/3ML) 0.083% nebulizer solution Inhale 3 mLs into the lungs every 6 (six) hours as needed. Shortness of breath 11/08/15   Historical Provider, MD  atomoxetine (STRATTERA) 25 MG capsule Take 25 mg by mouth daily. Takes with the 100 mg to make 125 mg daily    Historical Provider, MD  Budesonide (UCERIS) 2 MG/ACT FOAM Place 1 application rectally 2 (two) times daily. For 2 weeks then every evening for 4 weeks 12/18/15   [name], NP  budesonide-formoterol [facility]) 160-4.5 MCG/ACT inhaler Inhale 2 puffs into the lungs 2 (two) times daily.    Historical Provider, MD  divalproex (DEPAKOTE) 500 MG DR tablet Take 1 tablet (500 mg total) by mouth 2 (two) times daily. 07/03/15   Penni Bombard, MD  EPINEPHrine 0.3 mg/0.3 mL IJ SOAJ injection Inject 0.3 mg into the muscle once. Reported on 12/18/2015    Historical Provider, MD  ferrous sulfate 325 (65 FE) MG tablet Take 1 tablet (325 mg total) by mouth 3 (three) times daily with meals. 12/31/15   Thurnell Lose, MD  fluticasone (FLONASE) 50 MCG/ACT nasal spray Place 1 spray into both nostrils daily. Patient taking differently: Place 1  spray into both nostrils daily as needed for allergies.  11/13/14   [name], NP  HYDROcodone-acetaminophen (NORCO) 5-325 MG tablet Take 1 tablet by mouth every 6 (six) hours as needed for moderate pain. 12/18/15   [name], NP  hyoscyamine (LEVSIN SL) 0.125 MG SL tablet Place 1 tablet (0.125 mg total) under the tongue every 4 (four) hours as needed. 01/22/16   [name], NP  loratadine (CLARITIN) 10 MG tablet Take 10 mg by mouth at bedtime.     Historical Provider, MD  mesalamine (APRISO) 0.375 G 24 hr capsule Take 4 capsules (1.5 g total) by mouth daily. 11/13/14   [name], NP  metroNIDAZOLE (FLAGYL) 500 MG tablet Take 1 tablet (500 mg total) by mouth 3 (three) times daily. 01/24/16 02/07/16  [name], NP  montelukast (SINGULAIR) 10 MG tablet Take 1 tablet (10 mg total) by mouth at bedtime. 11/13/14   [name], NP  ondansetron (ZOFRAN ODT) 8 MG disintegrating tablet Take 1 tablet (8 mg total) by mouth every 8 (eight) hours as needed for nausea or vomiting. Patient not taking: Reported on 01/21/2016 12/25/15  Jola Schmidt, MD  predniSONE (DELTASONE) 10 MG tablet Take 3 tablets (30 mg total) by mouth daily. Patient not taking: Reported on 01/21/2016 12/31/15   Thurnell Lose, MD  ranitidine (ZANTAC) 150 MG tablet Take 1 tablet (150 mg total) by mouth daily. 11/13/14   [name], NP  sertraline (ZOLOFT) 100 MG tablet Take 1 tablet (100 mg total) by mouth daily. 11/13/14   [name], NP  STRATTERA 100 MG capsule take one capsule by mouth once daily 08/09/15   Historical Provider, MD  traMADol (ULTRAM) 50 MG tablet Take 2 tablets (100 mg total) by mouth every 6 (six) hours as needed. 01/19/16   Rolland Porter, MD  traZODone (DESYREL) 100 MG tablet Take one tablet by mouth once daily at bedtime 08/07/15   Historical Provider, MD    Family History Family History  Problem Relation Age of Onset  . Asthma Mother   . Ulcers Mother   . Bipolar disorder Mother   . ADD / ADHD Father   .  Diabetes Maternal Grandmother   . Diabetes Maternal Grandfather   . Colon cancer Neg Hx   . Liver disease Neg Hx     Social History Social History  Substance Use Topics  . Smoking status: Former Smoker    Packs/day: 0.00    Years: 2.00    Quit date: 10/06/2012  . Smokeless tobacco: Never Used     Comment: Never smoked cigarettes  . Alcohol use No     Comment: denies usage     Allergies   Amoxicillin-pot clavulanate; Ibuprofen; Omeprazole; Pineapple; Strawberry extract; and Tomato   Review of Systems Review of Systems  HENT: Negative for trouble swallowing.   Respiratory: Negative for shortness of breath.   Musculoskeletal: Positive for joint swelling (left hand).  Skin: Positive for wound (insect bites to left hand and left upper arm).  All other systems reviewed and are negative.   Physical Exam Updated Vital Signs BP 145/78 (BP Location: Left Arm)   Pulse 102   Temp 98 F (36.7 C) (Oral)   Resp 16   Ht 5' 7"  (1.702 m)   Wt 232 lb (105.2 kg)   SpO2 100%   BMI 36.34 kg/m   Physical Exam  Constitutional: He is oriented to person, place, and time. He appears well-developed and well-nourished. No distress.  HENT:  Head: Normocephalic and atraumatic.  No swelling of the tongue. Airway is patent.  Eyes: Conjunctivae and EOM are normal.  Neck: Neck supple. No tracheal deviation present.  Cardiovascular: Normal rate.   Pulmonary/Chest: Effort normal. No respiratory distress.  Musculoskeletal: Normal range of motion.  Several raised areas of the left humerus area consistent with insect bite. Areas are not hot, no red streaks. FROM of left elbow. Red raised area at ulnar surface of the left hand, no red streaks. Mild swelling laterally. Cap refill < 2 seconds. Radial pulse 2+. No temperature changes of left versus right.  Lymphadenopathy:    He has no cervical adenopathy.  Neurological: He is alert and oriented to person, place, and time.  Skin: Skin is warm and dry.   Psychiatric: He has a normal mood and affect. His behavior is normal.  Nursing note and vitals reviewed.   ED Treatments / Results  Labs (all labs ordered are listed, but only abnormal results are displayed) Labs Reviewed - No data to display  EKG  EKG Interpretation None       Radiology No results found.  Procedures Procedures (including critical care time)  DIAGNOSTIC STUDIES: Oxygen Saturation is 100% on RA, normal by my interpretation.    COORDINATION OF CARE: 5:26 PM Discussed treatment plan with pt and his mother at bedside and they agreed to plan.  Medications Ordered in ED Medications - No data to display   Initial Impression / Assessment and Plan / ED Course  I have reviewed the triage vital signs and the nursing notes.  Pertinent labs & imaging results that were available during my care of the patient were reviewed by me and considered in my medical decision making (see chart for details).  Clinical Course    *I have reviewed nursing notes, vital signs, and all appropriate lab and imaging results for this patient.*  **I personally performed the services described in this documentation, which was scribed in my presence. The recorded information has been reviewed and is accurate.*  Final Clinical Impressions(s) / ED Diagnoses  Vital signs stable. No evidence for acute or life threatening reaction. No evidence for cellulitis. Rx for decadron and vistaril given to the patient. He will return to the Ed if any changes.   Final diagnoses:  None    New Prescriptions New Prescriptions   No medications on file     Lily Kocher, PA-C 02/05/16 [place], DO 02/07/16 2222

## 2016-02-06 ENCOUNTER — Encounter: Payer: Self-pay | Admitting: Gastroenterology

## 2016-02-07 ENCOUNTER — Other Ambulatory Visit: Payer: Self-pay | Admitting: Gastroenterology

## 2016-02-07 ENCOUNTER — Encounter (HOSPITAL_COMMUNITY)
Admission: RE | Admit: 2016-02-07 | Discharge: 2016-02-07 | Disposition: A | Discharge status: Home / Self Care | Payer: Medicaid Other | Source: Ambulatory Visit | Attending: Gastroenterology | Admitting: Gastroenterology

## 2016-02-07 ENCOUNTER — Encounter (HOSPITAL_COMMUNITY): Payer: Self-pay

## 2016-02-07 DIAGNOSIS — K5190 Ulcerative colitis, unspecified, without complications: Secondary | ICD-10-CM | POA: Diagnosis not present

## 2016-02-07 MED ORDER — SODIUM CHLORIDE 0.9 % IV SOLN
Freq: Once | INTRAVENOUS | Status: AC
  Administered 2016-02-07: 12:00:00 via INTRAVENOUS

## 2016-02-07 MED ORDER — SODIUM CHLORIDE 0.9 % IV SOLN
5.0000 mg/kg | Freq: Once | INTRAVENOUS | Status: AC
  Administered 2016-02-07: 500 mg via INTRAVENOUS
  Filled 2016-02-07: qty 50

## 2016-02-18 ENCOUNTER — Encounter (HOSPITAL_COMMUNITY): Payer: Self-pay | Admitting: Emergency Medicine

## 2016-02-18 ENCOUNTER — Emergency Department (HOSPITAL_COMMUNITY): Payer: Medicaid Other

## 2016-02-18 ENCOUNTER — Emergency Department (HOSPITAL_COMMUNITY)
Admission: EM | Admit: 2016-02-18 | Discharge: 2016-02-18 | Disposition: A | Discharge status: Home / Self Care | Payer: Medicaid Other | Attending: Emergency Medicine | Admitting: Emergency Medicine

## 2016-02-18 DIAGNOSIS — Z79899 Other long term (current) drug therapy: Secondary | ICD-10-CM | POA: Diagnosis not present

## 2016-02-18 DIAGNOSIS — R1010 Upper abdominal pain, unspecified: Secondary | ICD-10-CM

## 2016-02-18 DIAGNOSIS — R1084 Generalized abdominal pain: Secondary | ICD-10-CM | POA: Insufficient documentation

## 2016-02-18 DIAGNOSIS — Z87891 Personal history of nicotine dependence: Secondary | ICD-10-CM | POA: Insufficient documentation

## 2016-02-18 DIAGNOSIS — F909 Attention-deficit hyperactivity disorder, unspecified type: Secondary | ICD-10-CM | POA: Insufficient documentation

## 2016-02-18 DIAGNOSIS — J45909 Unspecified asthma, uncomplicated: Secondary | ICD-10-CM | POA: Insufficient documentation

## 2016-02-18 LAB — CBC WITH DIFFERENTIAL/PLATELET
Basophils Absolute: 0 10*3/uL (ref 0.0–0.1)
Basophils Relative: 0 %
Eosinophils Absolute: 0.1 10*3/uL (ref 0.0–0.7)
Eosinophils Relative: 1 %
HCT: 46.6 % (ref 39.0–52.0)
Hemoglobin: 15.8 g/dL (ref 13.0–17.0)
Lymphocytes Relative: 28 %
Lymphs Abs: 2.3 10*3/uL (ref 0.7–4.0)
MCH: 28.6 pg (ref 26.0–34.0)
MCHC: 33.9 g/dL (ref 30.0–36.0)
MCV: 84.3 fL (ref 78.0–100.0)
Monocytes Absolute: 1 10*3/uL (ref 0.1–1.0)
Monocytes Relative: 12 %
Neutro Abs: 4.8 10*3/uL (ref 1.7–7.7)
Neutrophils Relative %: 59 %
Platelets: 270 10*3/uL (ref 150–400)
RBC: 5.53 MIL/uL (ref 4.22–5.81)
RDW: 17.5 % — ABNORMAL HIGH (ref 11.5–15.5)
WBC: 8.1 10*3/uL (ref 4.0–10.5)

## 2016-02-18 LAB — BASIC METABOLIC PANEL
Anion gap: 8 (ref 5–15)
BUN: 13 mg/dL (ref 6–20)
CO2: 25 mmol/L (ref 22–32)
Calcium: 9.5 mg/dL (ref 8.9–10.3)
Chloride: 104 mmol/L (ref 101–111)
Creatinine, Ser: 0.92 mg/dL (ref 0.61–1.24)
GFR calc Af Amer: >60 mL/min (ref >60)
GFR calc non Af Amer: >60 mL/min (ref >60)
Glucose, Bld: 75 mg/dL (ref 65–99)
Potassium: 4.7 mmol/L (ref 3.5–5.1)
Sodium: 137 mmol/L (ref 135–145)

## 2016-02-18 MED ORDER — OXYCODONE-ACETAMINOPHEN 5-325 MG PO TABS: 1.0000 | ORAL_TABLET | Freq: Four times a day (QID) | ORAL | 0 refills | Status: DC | PRN

## 2016-02-18 MED ORDER — OXYCODONE-ACETAMINOPHEN 5-325 MG PO TABS
1.0000 | ORAL_TABLET | Freq: Once | ORAL | Status: AC
  Administered 2016-02-18: 1 via ORAL
  Filled 2016-02-18: qty 1

## 2016-02-18 NOTE — Discharge Instructions (Signed)
Follow up with your gi md later this week

## 2016-02-18 NOTE — ED Provider Notes (Signed)
[facility] DEPT Provider Note   CSN: [id number] Arrival date & time: 02/18/16  1625     History   Chief Complaint Chief Complaint  Patient presents with  . Abdominal Pain    HPI Wayne Avila is a 25 y.o. male.  Patient complains of general abdominal pain. Patient has a history of chronic abdominal problems and sees a GI doctor   The history is provided by the patient. No language interpreter was used.  Abdominal Pain   This is a recurrent problem. The problem occurs constantly. The problem has not changed since onset.The pain is associated with an unknown factor. The pain is located in the generalized abdominal region. The quality of the pain is aching. The pain is at a severity of 3/10. The pain is mild. Pertinent negatives include anorexia, diarrhea, frequency, hematuria and headaches. Nothing aggravates the symptoms.    Past Medical History:  Diagnosis Date  . Abdominal pain, chronic, right upper quadrant   . ADHD (attention deficit hyperactivity disorder)   . Anxiety   . Asthma   . Bipolar 1 disorder ([place])   . Colitis   . Depression   . GERD (gastroesophageal reflux disease)   . Hearing loss   . HOH (hard of hearing)   . Mental developmental delay 11/15/2012  . PONV (postoperative nausea and vomiting)   . Seizures [facility])     Patient Active Problem List   Diagnosis Date Noted  . Ulcerative colitis ([place]) 12/29/2015  . CAP (community acquired pneumonia)   . 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 Acute colitis 11/21/2015  . Aspiration pneumonitis ([place]) 11/21/2015  . Chronic blood loss anemia 11/21/2015  . Bipolar I disorder, most recent episode depressed ([place])   . Bipolar 1 disorder, manic, moderate ([employer]) 11/09/2014  . Suicidal ideation   . Major depressive disorder, recurrent episode, moderate ([place])   . Oral candida 02/23/2014  . UC (ulcerative colitis) ([place]) 10/25/2013  . Abdominal pain, acute 09/20/2013  . Rectal bleeding 08/09/2013  . Unintentional weight loss  08/09/2013  . RUQ pain 08/09/2013  . Unspecified gastritis and gastroduodenitis without mention of hemorrhage 04/20/2013  . Upper abdominal pain 12/01/2012  . Nausea with vomiting 12/01/2012  . Mental developmental delay 11/15/2012    Past Surgical History:  Procedure Laterality Date  . ADENOIDECTOMY    . BIOPSY N/A 12/07/2012   Procedure: GASTRIC BIOPSIES;  Surgeon: [name], MD;  Location: [facility] ORS;  Service: Endoscopy;  Laterality: N/A;  . BIOPSY N/A 08/30/2013   Procedure: BIOPSY;  Surgeon: [name], MD;  Location: [facility] ORS;  Service: Endoscopy;  Laterality: N/A;  right colon,transverse colon, left colon, rectal biopsies  . BIOPSY  11/20/2015   Procedure: BIOPSY;  Surgeon: [name], MD;  Location: [facility] ENDO SUITE;  Service: Endoscopy;;  ileal, right colon biopsy, left colon, rectum  . COLONOSCOPY  11/20/2015   Dr. [name]: Severe erythema, edema, ulcers from the anal verge to 20 cm above the anal verge without mucosal sparing, remaining colon and terminal ileum appeared normal. Biopsies from the rectum revealed fulminant active chronic colitis consistent with IBD. Pathology from terminal ileum revealed intramucosal lymphoid aggregates. Remaining colon random biopsies with inactive chronic colitis  . COLONOSCOPY WITH PROPOFOL N/A 08/30/2013   KAJ:GOTLXB mucosa in the terminal ileum/COLITIS/ MILD PROCTITIS. 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  Marland Kitchen COLONOSCOPY WITH PROPOFOL N/A 11/20/2015   Dr. [name]: chronic inactive  pancolitis and active severe ulcerative proctitis   . ESOPHAGOGASTRODUODENOSCOPY  11/20/2015   Dr. [name]: Normal exam, stomach biopsied and duodenal biopsy.. Benign biopsies.  . ESOPHAGOGASTRODUODENOSCOPY (EGD) WITH PROPOFOL N/A 12/07/2012   SLF:The mucosa of the esophagus appeared normal Non-erosive gastritis (inflammation) was found in the gastric antrum;  multiple biopsies The duodenal mucosa showed no abnormalities in the bulb and second portion of the duodenum  . ESOPHAGOGASTRODUODENOSCOPY (EGD) WITH PROPOFOL N/A 11/20/2015   Dr. [name]: normal with normal biopsies, negative H.pylori   . TONSILLECTOMY         Home Medications    Prior to Admission medications   Medication Sig Start Date End Date Taking? Authorizing Provider  albuterol (PROAIR HFA) 108 (90 BASE) MCG/ACT inhaler Inhale 2 puffs into the lungs every 4 (four) hours as needed for wheezing. Persistent dry cough 09/21/13  Yes Alethea Johnathan Hausen, MD  albuterol (PROVENTIL) (2.5 MG/3ML) 0.083% nebulizer solution Inhale 3 mLs into the lungs every 6 (six) hours as needed. Shortness of breath 11/08/15  Yes Historical Provider, MD  APRISO 0.375 g 24 hr capsule TAKE 4 CAPSULES(1.5 GRAMS) BY MOUTH DAILY 02/14/16  Yes [name], NP  atomoxetine (STRATTERA) 25 MG capsule Take 25 mg by mouth daily. Takes with the 100 mg to make 125 mg daily   Yes Historical Provider, MD  budesonide-formoterol (SYMBICORT) 160-4.5 MCG/ACT inhaler Inhale 2 puffs into the lungs 2 (two) times daily.   Yes Historical Provider, MD  divalproex (DEPAKOTE) 500 MG DR tablet Take 1 tablet (500 mg total) by mouth 2 (two) times daily. 07/03/15  Yes Penni Bombard, MD  fluticasone (FLONASE) 50 MCG/ACT nasal spray Place 1 spray into both nostrils daily. Patient taking differently: Place 1 spray into both nostrils daily as needed for allergies.  11/13/14  Yes [name], NP  HYDROcodone-acetaminophen (NORCO) 5-325 MG tablet Take 1 tablet by mouth every 6 (six) hours as needed for moderate pain. 12/18/15  Yes [name], NP  hydrOXYzine (VISTARIL) 25 MG capsule Take 1 capsule (25 mg total) by mouth 3 (three) times daily as needed for itching. 02/03/16  Yes Lily Kocher, PA-C  hyoscyamine (LEVSIN SL) 0.125 MG SL tablet Place 1 tablet (0.125 mg total) under the tongue every 4 (four) hours as needed. 01/22/16  Yes [name], NP    loratadine (CLARITIN) 10 MG tablet Take 10 mg by mouth at bedtime.    Yes Historical Provider, MD  montelukast (SINGULAIR) 10 MG tablet Take 1 tablet (10 mg total) by mouth at bedtime. 11/13/14  Yes [name], NP  ranitidine (ZANTAC) 150 MG tablet Take 1 tablet (150 mg total) by mouth daily. 11/13/14  Yes [name], NP  sertraline (ZOLOFT) 100 MG tablet Take 1 tablet (100 mg total) by mouth daily. 11/13/14  Yes [name], NP  STRATTERA 100 MG capsule take one capsule by mouth once daily 08/09/15  Yes Historical Provider, MD  traMADol (ULTRAM) 50 MG tablet Take 2 tablets (100 mg total) by mouth every 6 (six) hours as needed. 01/19/16  Yes Rolland Porter, MD  traZODone (DESYREL) 100 MG tablet Take one tablet by mouth once daily at bedtime 08/07/15  Yes Historical Provider, MD  triamcinolone cream (KENALOG) 0.1 % Apply 1 application topically 2 (two) times daily. 02/03/16  Yes Lily Kocher, PA-C  Budesonide (UCERIS) 2 MG/ACT FOAM Place 1 application rectally 2 (two) times daily. For 2 weeks then every evening for 4 weeks Patient not taking: Reported  on 02/18/2016 12/18/15   [name], NP  dexamethasone (DECADRON) 4 MG tablet Take 1 tablet (4 mg total) by mouth 2 (two) times daily with a meal. Patient not taking: Reported on 02/18/2016 02/03/16   Lily Kocher, PA-C  EPINEPHrine 0.3 mg/0.3 mL IJ SOAJ injection Inject 0.3 mg into the muscle once. Reported on 12/18/2015    Historical Provider, MD  ferrous sulfate 325 (65 FE) MG tablet Take 1 tablet (325 mg total) by mouth 3 (three) times daily with meals. Patient not taking: Reported on 02/18/2016 12/31/15   Thurnell Lose, MD  ondansetron (ZOFRAN ODT) 8 MG disintegrating tablet Take 1 tablet (8 mg total) by mouth every 8 (eight) hours as needed for nausea or vomiting. Patient not taking: Reported on 01/21/2016 12/25/15   Jola Schmidt, MD  oxyCODONE-acetaminophen (PERCOCET/ROXICET) 5-325 MG tablet Take 1 tablet by mouth every 6 (six) hours as needed. 02/18/16    Milton Ferguson, MD  predniSONE (DELTASONE) 10 MG tablet Take 3 tablets (30 mg total) by mouth daily. Patient not taking: Reported on 01/21/2016 12/31/15   Thurnell Lose, MD    Family History Family History  Problem Relation Age of Onset  . Asthma Mother   . Ulcers Mother   . Bipolar disorder Mother   . ADD / ADHD Father   . Diabetes Maternal Grandmother   . Diabetes Maternal Grandfather   . Colon cancer Neg Hx   . Liver disease Neg Hx     Social History Social History  Substance Use Topics  . Smoking status: Former Smoker    Packs/day: 0.00    Years: 2.00    Quit date: 10/06/2012  . Smokeless tobacco: Never Used     Comment: Never smoked cigarettes  . Alcohol use No     Comment: denies usage     Allergies   Amoxicillin-pot clavulanate; Ibuprofen; Omeprazole; Pineapple; Strawberry extract; and Tomato   Review of Systems Review of Systems  Constitutional: Negative for appetite change and fatigue.  HENT: Negative for congestion, ear discharge and sinus pressure.   Eyes: Negative for discharge.  Respiratory: Negative for cough.   Cardiovascular: Negative for chest pain.  Gastrointestinal: Positive for abdominal pain. Negative for anorexia and diarrhea.  Genitourinary: Negative for frequency and hematuria.  Musculoskeletal: Negative for back pain.  Skin: Negative for rash.  Neurological: Negative for seizures and headaches.  Psychiatric/Behavioral: Negative for hallucinations.     Physical Exam Updated Vital Signs BP 127/84   Pulse 81   Temp 98.7 F (37.1 C) (Oral)   Resp 17   Ht 5' 7"  (1.702 m)   Wt 239 lb (108.4 kg)   SpO2 99%   BMI 37.43 kg/m   Physical Exam  Constitutional: He is oriented to person, place, and time. He appears well-developed.  HENT:  Head: Normocephalic.  Eyes: Conjunctivae and EOM are normal. No scleral icterus.  Neck: Neck supple. No thyromegaly present.  Cardiovascular: Normal rate and regular rhythm.  Exam reveals no gallop and  no friction rub.   No murmur heard. Pulmonary/Chest: No stridor. He has no wheezes. He has no rales. He exhibits no tenderness.  Abdominal: He exhibits no distension. There is tenderness. There is no rebound.  Mild tenderness throughout  Musculoskeletal: Normal range of motion. He exhibits no edema.  Lymphadenopathy:    He has no cervical adenopathy.  Neurological: He is oriented to person, place, and time. He exhibits normal muscle tone. Coordination normal.  Skin: No rash noted. No erythema.  Psychiatric: He has a normal mood and affect. His behavior is normal.     ED Treatments / Results  Labs (all labs ordered are listed, but only abnormal results are displayed) Labs Reviewed  CBC WITH DIFFERENTIAL/PLATELET - Abnormal; Notable for the following:       Result Value   RDW 17.5 (*)    All other components within normal limits  BASIC METABOLIC PANEL    EKG  EKG Interpretation None       Radiology Dg Abd Acute W/chest  Result Date: 02/18/2016 CLINICAL DATA:  Left lower quadrant pain beginning last night. History of ulcerative colitis. EXAM: DG ABDOMEN ACUTE W/ 1V CHEST COMPARISON:  Chest in two views abdomen 11/21/2015. CT abdomen and pelvis 01/27/2016. FINDINGS: There is no evidence of dilated bowel loops or free intraperitoneal air. No radiopaque calculi or other significant radiographic abnormality is seen. Heart size and mediastinal contours are within normal limits. Both lungs are clear. IMPRESSION: Negative exam. Electronically Signed   By: Inge Rise M.D.   On: 02/18/2016 18:35    Procedures Procedures (including critical care time)  Medications Ordered in ED Medications  oxyCODONE-acetaminophen (PERCOCET/ROXICET) 5-325 MG per tablet 1 tablet (not administered)     Initial Impression / Assessment and Plan / ED Course  I have reviewed the triage vital signs and the nursing notes.  Pertinent labs & imaging results that were available during my care of the  patient were reviewed by me and considered in my medical decision making (see chart for details).  Clinical Course    Labs and acute abdominal series are unremarkable. Suspect this is an exacerbation of his chronic abdominal pain. He'll be given some pain medicine will follow-up with his GI doctor this week  Final Clinical Impressions(s) / ED Diagnoses   Final diagnoses:  Pain of upper abdomen    New Prescriptions New Prescriptions   OXYCODONE-ACETAMINOPHEN (PERCOCET/ROXICET) 5-325 MG TABLET    Take 1 tablet by mouth every 6 (six) hours as needed.     Milton Ferguson, MD 02/18/16 2147

## 2016-02-18 NOTE — ED Triage Notes (Signed)
Pt states that LLQ abdominal pain started last night with pain radiating towards back. Pt states he has hx of Ulcer Colitis. Symptoms are similar to past hx of UC.

## 2016-02-20 ENCOUNTER — Ambulatory Visit (INDEPENDENT_AMBULATORY_CARE_PROVIDER_SITE_OTHER): Payer: Medicaid Other | Admitting: Gastroenterology

## 2016-02-20 ENCOUNTER — Encounter: Payer: Self-pay | Admitting: Gastroenterology

## 2016-02-20 ENCOUNTER — Ambulatory Visit: Payer: Self-pay | Admitting: Gastroenterology

## 2016-02-20 ENCOUNTER — Other Ambulatory Visit: Payer: Self-pay

## 2016-02-20 DIAGNOSIS — K6289 Other specified diseases of anus and rectum: Secondary | ICD-10-CM

## 2016-02-20 DIAGNOSIS — K51011 Ulcerative (chronic) pancolitis with rectal bleeding: Secondary | ICD-10-CM

## 2016-02-20 DIAGNOSIS — D500 Iron deficiency anemia secondary to blood loss (chronic): Secondary | ICD-10-CM

## 2016-02-20 DIAGNOSIS — E559 Vitamin D deficiency, unspecified: Secondary | ICD-10-CM

## 2016-02-20 NOTE — Patient Instructions (Addendum)
COMPLETE THE FLU SHOT.  COMPLETE THE PNEUMONIA SHOTS. IT IS A TWO SHOT SERIES. YOU NEED PREVNAR FIRST THEN IN 8 WEEKS THE PNEUMOVAX.    DRINK WATER TO KEEP YOUR URINE LIGHT YELLOW.  FOLLOW A HIGH FIBER DIET. AVOID ITEMS THAT CAUSE BLOATING & GAS. SEE INFO BELOW.  TO RESOLVE CURRENT CONSTIPATION, Dissolve 17 grams in 8 ounces of water. GIVE [place]. REPEAT EVERY HOUR FOR 3 HOURS TODAY AND REPEAT TOMORROW. HOL DMIRALAX IF YOU HAVE DIARRHEA.  START LINZESS 1 DAILY ON TODAY WITH LUNCH. IF YOU ARE NOT HAVING A SATISFACTORY BOWEL MOVEMENT AFTER 1 WEEK, CALL ME AND WE WILL INCREASE THE DOSE.  STOP TAKING HYOSCYAMINE AND APRISO.  CONTINUE REMICADE.  COMPLETE YOUR LAB DRAW. YOU CAN GET THEM DRAWN WITH YOUR NEXT REMICADE DOSE.  PLEASE  CALL IN 2 WEEKS IF SYMPTOMS ARE NOT IMPROVED OR CALL WITH QUESTIONS OR CONCERNS.  FOLLOW UP IN 4 MOS.   High-Fiber Diet A high-fiber diet changes your normal diet to include more whole grains, legumes, fruits, and vegetables. Changes in the diet involve replacing refined carbohydrates with unrefined foods. The calorie level of the diet is essentially unchanged. The Dietary Reference Intake (recommended amount) for adult males is 38 grams per day. For adult females, it is 25 grams per day. Pregnant and lactating women should consume 28 grams of fiber per day. Fiber is the intact part of a plant that is not broken down during digestion. Functional fiber is fiber that has been isolated from the plant to provide a beneficial effect in the body. PURPOSE  Increase stool bulk.   Ease and regulate bowel movements.   Lower cholesterol.   REDUCE RISK OF COLON CANCER  INDICATIONS THAT YOU NEED MORE FIBER  Constipation and hemorrhoids.   Uncomplicated diverticulosis (intestine condition) and irritable bowel syndrome.   Weight management.   As a protective measure against hardening of the arteries (atherosclerosis),  diabetes, and cancer.   GUIDELINES FOR INCREASING FIBER IN THE DIET  Start adding fiber to the diet slowly. A gradual increase of about 5 more grams (2 slices of whole-wheat bread, 2 servings of most fruits or vegetables, or 1 bowl of high-fiber cereal) per day is best. Too rapid an increase in fiber may result in constipation, flatulence, and bloating.   Drink enough water and fluids to keep your urine clear or pale yellow. Water, juice, or caffeine-free drinks are recommended. Not drinking enough fluid may cause constipation.   Eat a variety of high-fiber foods rather than one type of fiber.   Try to increase your intake of fiber through using high-fiber foods rather than fiber pills or supplements that contain small amounts of fiber.   The goal is to change the types of food eaten. Do not supplement your present diet with high-fiber foods, but replace foods in your present diet.   INCLUDE A VARIETY OF FIBER SOURCES  Replace refined and processed grains with whole grains, canned fruits with fresh fruits, and incorporate other fiber sources. White rice, white breads, and most bakery goods contain little or no fiber.   Brown whole-grain rice, buckwheat oats, and many fruits and vegetables are all good sources of fiber. These include: broccoli, Brussels sprouts, cabbage, cauliflower, beets, sweet potatoes, white potatoes (skin on), carrots, tomatoes, eggplant, squash, berries, fresh fruits, and dried fruits.   Cereals appear to be the richest source of fiber. Cereal fiber is found in whole grains and bran.  Bran is the fiber-rich outer coat of cereal grain, which is largely removed in refining. In whole-grain cereals, the bran remains. In breakfast cereals, the largest amount of fiber is found in those with "bran" in their names. The fiber content is sometimes indicated on the label.   You may need to include additional fruits and vegetables each day.   In baking, for 1 cup white flour, you may  use the following substitutions:   1 cup whole-wheat flour minus 2 tablespoons.   1/2 cup white flour plus 1/2 cup whole-wheat flour.

## 2016-02-20 NOTE — Assessment & Plan Note (Addendum)
SYMPTOMS CONTROLLED/RESOLVED AND NOW HAS CONSTIPATION WHICH IC NOT CONTROLLED AND CUASING ABDOMINAL PAIN. LAST CT AUG 2017-MILD PROCTITIS/SMALL PERI-RECTALLN..   DISCUSSED MANAGEMENT OF CROHN'S AND CLINCAL IMPROVEMENT WITH MOTHER AND PT.Marland Kitchen DRINK WATER TO KEEP YOUR URINE LIGHT YELLOW. FOLLOW A HIGH FIBER DIET. AVOID ITEMS THAT CAUSE BLOATING & GAS. HANDOUT GIVEN. TO RESOLVE CURRENT CONSTIPATION, Dissolve 17 grams in 8 ounces of water. GIVE [place]. REPEAT EVERY HOUR FOR 3 HOURS TODAY AND REPEAT TOMORROW. HOL DMIRALAX IF YOU HAVE DIARRHEA. START LINZESS 1 DAILY ON TODAY WITH LUNCH. IF YOU ARE NOT HAVING A SATISFACTORY BOWEL MOVEMENT AFTER 1 WEEK, CALL ME AND WE WILL INCREASE THE DOSE. STOP TAKING HYOSCYAMINE. CONTINUE REMICADE. COMPLETE YOUR LAB DRAW.YPT WILL GET THEM DRAWN WITH YOUR NEXT REMICADE DOSE. PLEASE  CALL IN 2 WEEKS IF SYMPTOMS ARE NOT IMPROVED OR CALL WITH QUESTIONS OR CONCERNS. STOP APRISO.  COMPLETE THE FLU SHOT. COMPLETE THE PNEUMONIA SHOTS. IT IS A TWO SHOT SERIES- PREVNAR FIRST THEN IN 8 WEEKS THE PNEUMOVAX. DERMATOLOGY REFERRAL FOR ANNUAL SKIN EXAM. CHECK VIT D 25 OH AND TOTAL HEP A Ab. FOLLOW UP IN 4 MOS.

## 2016-02-20 NOTE — Addendum Note (Signed)
Addended by: [name] on: 02/20/2016 11:36 AM   Modules accepted: Orders

## 2016-02-20 NOTE — Addendum Note (Signed)
Addended by: [name] on: 02/20/2016 11:38 AM   Modules accepted: Orders

## 2016-02-20 NOTE — Assessment & Plan Note (Signed)
DUE TO UNCONTROLLED DISEASE. CLINICALLY IMPROVED-LAST Hb 15.15 Jan 2016.  CONTINUE TO MONITOR SYMPTOMS.

## 2016-02-20 NOTE — Progress Notes (Signed)
Subjective:    Patient ID: Wayne Avila, male    DOB: Nov 02, 1990, 25 y.o.   MRN: [id number] [name], MD  HPI NO BOWEL MOVEMENT SINCE SAT. NOT EATING A WHOLE LOT BECAUSE HIS STOMACH IS BOTHERING. STOMACH ON LEFT SIDE: ACHY. DOES KEEP HIM AWAKE. CONSTIPATION: TROUBLE STARTED IN [name]. USED HYOSCYAMINE DUE TO ABDOMINAL PAIN. SEEN IN ED 3 TIMES SINCE AUG 11 FOR ABDOMINAL PAIN AND ONCE FOR LEFT ARM INSECT BITE. STILL TAKING APRISO. PLAYS VIDEO GAMES ALL DAY. TAKES KNAPS DURING DAY.  PT DENIES FEVER, CHILLS, HEMATOCHEZIA, HEMATEMESIS, nausea, vomiting, melena, diarrhea, CHEST PAIN, SHORTNESS OF BREATH, CHANGE IN BOWEL IN HABITS, problems swallowing, OR heartburn or indigestion.   Past Medical History:  Diagnosis Date  . Abdominal pain, chronic, right upper quadrant   . ADHD (attention deficit hyperactivity disorder)   . Anxiety   . Asthma   . Bipolar 1 disorder ([place])   . Colitis   . Depression   . GERD (gastroesophageal reflux disease)   . Hearing loss   . HOH (hard of hearing)   . Mental developmental delay 11/15/2012  . PONV (postoperative nausea and vomiting)   . Seizures ([place])     Past Surgical History:  Procedure Laterality Date  . ADENOIDECTOMY    . BIOPSY N/A 12/07/2012   Procedure: GASTRIC BIOPSIES;  Surgeon: [name], MD;  Location: [facility];  Service: Endoscopy;  Laterality: N/A;  . BIOPSY N/A 08/30/2013   Procedure: BIOPSY;  Surgeon: [name], MD;  Location: [facility];  Service: Endoscopy;  Laterality: N/A;  right colon,transverse colon, left colon, rectal biopsies  . BIOPSY  11/20/2015   Procedure: BIOPSY;  Surgeon: [name], MD;  Location: [facility] SUITE;  Service: Endoscopy;;  ileal, right colon biopsy, left colon, rectum  . COLONOSCOPY  11/20/2015   Dr. [name]: Severe erythema, edema, ulcers from the anal verge to 20 cm above the anal verge without mucosal sparing, remaining colon and terminal ileum appeared normal. Biopsies from the rectum revealed  fulminant active chronic colitis consistent with IBD. Pathology from terminal ileum revealed intramucosal lymphoid aggregates. Remaining colon random biopsies with inactive chronic colitis  . COLONOSCOPY WITH PROPOFOL N/A 08/30/2013   HQR:FXJOIT mucosa in the terminal ileum/COLITIS/ MILD PROCTITIS. 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  Marland Kitchen COLONOSCOPY WITH PROPOFOL N/A 11/20/2015   Dr. 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 ESOPHAGOGASTRODUODENOSCOPY  11/20/2015   Dr. [name]: Normal exam, stomach biopsied and duodenal biopsy.. Benign biopsies.  . ESOPHAGOGASTRODUODENOSCOPY (EGD) WITH PROPOFOL N/A 12/07/2012   SLF:The mucosa of the esophagus appeared normal Non-erosive gastritis (inflammation) was found in the gastric antrum; multiple biopsies The duodenal mucosa showed no abnormalities in the bulb and second portion of the duodenum  . ESOPHAGOGASTRODUODENOSCOPY (EGD) WITH PROPOFOL N/A 11/20/2015   Dr. [name]: normal with normal biopsies, negative H.pylori   . TONSILLECTOMY      Allergies  Allergen Reactions  . Amoxicillin-Pot Clavulanate Nausea And Vomiting  . Ibuprofen Other (See Comments)    Patient has ulcerative colitis  . Omeprazole Nausea And Vomiting  . Pineapple Swelling  . Strawberry Extract Swelling  . Tomato Rash    Current Outpatient Prescriptions  Medication Sig Dispense Refill  . albuterol (PROAIR HFA) 108 (90 BASE) MCG/ACT inhaler Inhale 2 puffs into the lungs every 4 (four) hours as needed for wheezing. Persistent dry  cough    . albuterol (PROVENTIL) (2.5 MG/3ML) 0.083% nebulizer solution Inhale 3 mLs into the lungs every 6 (six) hours as needed. Shortness of breath    . APRISO 0.375 g 24 hr capsule TAKE 4 CAPSULES(1.5 GRAMS) BY MOUTH DAILY    . STRATTERA 25 MG capsule Take 25 mg by mouth daily. Takes with the 100 mg to  make 125 mg daily    . budesonide-formoterol (SYMBICORT) 160-4.5 MCG/ACT inhaler Inhale 2 puffs into the lungs 2 (two) times daily.    . divalproex (DEPAKOTE) 500 MG DR tablet Take 1 tablet (500 mg total) by mouth 2 (two) times daily.    Marland Kitchen EPINEPHrine 0.3 mg/0.3 mL IJ SOAJ injection Inject 0.3 mg into the muscle once. Reported on 12/18/2015    . fluticasone (FLONASE) 50 MCG/ACT nasal spray Place 1 spray into both nostrils daily. (Patient taking differently: Place 1 spray into both nostrils daily as needed for allergies. )    . hydrOXYzine (VISTARIL) 25 MG capsule Take 1 capsule (25 mg total) by mouth 3 (three) times daily as needed for itching.    . hyoscyamine (LEVSIN SL) 0.125 MG SL tablet Place 1 tablet (0.125 mg total) under the tongue every 4 (four) hours as needed.    . loratadine (CLARITIN) 10 MG tablet Take 10 mg by mouth at bedtime.     . montelukast (SINGULAIR) 10 MG tablet Take 1 tablet (10 mg total) by mouth at bedtime.    .      . PERCOCET/ROXICET 5-325 MG tablet Take 1 tablet by mouth every 6 (six) hours as needed.    . ranitidine (ZANTAC) 150 MG tablet Take 1 tablet (150 mg total) by mouth daily.    . sertraline (ZOLOFT) 100 MG tablet Take 1 tablet (100 mg total) by mouth daily.    Marland Kitchen STRATTERA 100 MG capsule take one capsule by mouth once daily    . traMADol (ULTRAM) 50 MG tablet Take 2 tablets (100 mg total) by mouth every 6 (six) hours as needed.    . traZODone (DESYREL) 100 MG tablet Take one tablet by mouth once daily at bedtime    . triamcinolone cream (KENALOG) 0.1 % Apply 1 application topically 2 (two) times daily.    .      .      .      .      .       Review of Systems     Objective:   Physical Exam        Assessment & Plan:

## 2016-02-20 NOTE — Progress Notes (Signed)
ON RECALL  °

## 2016-02-20 NOTE — Assessment & Plan Note (Signed)
MILD DISEASE ON CT AFTER 2 DOSES OF REMICADE. CLINICALLY IMPROVED.  CONTINUE TO MONITOR SYMPTOMS.

## 2016-02-20 NOTE — Progress Notes (Signed)
CC'ED TO PCP 

## 2016-02-21 ENCOUNTER — Encounter (HOSPITAL_COMMUNITY): Payer: Self-pay

## 2016-03-01 ENCOUNTER — Emergency Department (HOSPITAL_COMMUNITY)
Admission: EM | Admit: 2016-03-01 | Discharge: 2016-03-01 | Disposition: A | Discharge status: Home / Self Care | Payer: Medicaid Other | Attending: Emergency Medicine | Admitting: Emergency Medicine

## 2016-03-01 ENCOUNTER — Emergency Department (HOSPITAL_COMMUNITY): Payer: Medicaid Other

## 2016-03-01 ENCOUNTER — Encounter (HOSPITAL_COMMUNITY): Payer: Self-pay | Admitting: Emergency Medicine

## 2016-03-01 DIAGNOSIS — Z87891 Personal history of nicotine dependence: Secondary | ICD-10-CM | POA: Insufficient documentation

## 2016-03-01 DIAGNOSIS — R1011 Right upper quadrant pain: Secondary | ICD-10-CM | POA: Insufficient documentation

## 2016-03-01 DIAGNOSIS — J45909 Unspecified asthma, uncomplicated: Secondary | ICD-10-CM | POA: Insufficient documentation

## 2016-03-01 DIAGNOSIS — R079 Chest pain, unspecified: Secondary | ICD-10-CM | POA: Diagnosis not present

## 2016-03-01 DIAGNOSIS — F909 Attention-deficit hyperactivity disorder, unspecified type: Secondary | ICD-10-CM | POA: Insufficient documentation

## 2016-03-01 DIAGNOSIS — Z79899 Other long term (current) drug therapy: Secondary | ICD-10-CM | POA: Insufficient documentation

## 2016-03-01 DIAGNOSIS — R109 Unspecified abdominal pain: Secondary | ICD-10-CM

## 2016-03-01 LAB — COMPREHENSIVE METABOLIC PANEL
ALT: 26 U/L (ref 17–63)
AST: 21 U/L (ref 15–41)
Albumin: 4.1 g/dL (ref 3.5–5.0)
Alkaline Phosphatase: 50 U/L (ref 38–126)
Anion gap: 7 (ref 5–15)
BUN: 8 mg/dL (ref 6–20)
CO2: 25 mmol/L (ref 22–32)
Calcium: 9.6 mg/dL (ref 8.9–10.3)
Chloride: 106 mmol/L (ref 101–111)
Creatinine, Ser: 0.86 mg/dL (ref 0.61–1.24)
GFR calc Af Amer: >60 mL/min (ref >60)
GFR calc non Af Amer: >60 mL/min (ref >60)
Glucose, Bld: 78 mg/dL (ref 65–99)
Potassium: 4.2 mmol/L (ref 3.5–5.1)
Sodium: 138 mmol/L (ref 135–145)
Total Bilirubin: 0.3 mg/dL (ref 0.3–1.2)
Total Protein: 7.2 g/dL (ref 6.5–8.1)

## 2016-03-01 LAB — URINALYSIS, ROUTINE W REFLEX MICROSCOPIC
Bilirubin Urine: NEGATIVE
Glucose, UA: NEGATIVE mg/dL
Hgb urine dipstick: NEGATIVE
Ketones, ur: NEGATIVE mg/dL
Leukocytes, UA: NEGATIVE
Nitrite: NEGATIVE
Protein, ur: NEGATIVE mg/dL
Specific Gravity, Urine: 1.025 (ref 1.005–1.030)
pH: 6 (ref 5.0–8.0)

## 2016-03-01 LAB — TROPONIN I: Troponin I: <0.03 ng/mL (ref <0.03)

## 2016-03-01 LAB — LIPASE, BLOOD: Lipase: 19 U/L (ref 11–51)

## 2016-03-01 LAB — CBC
HCT: 44.1 % (ref 39.0–52.0)
Hemoglobin: 15.2 g/dL (ref 13.0–17.0)
MCH: 28.6 pg (ref 26.0–34.0)
MCHC: 34.5 g/dL (ref 30.0–36.0)
MCV: 82.9 fL (ref 78.0–100.0)
Platelets: 220 10*3/uL (ref 150–400)
RBC: 5.32 MIL/uL (ref 4.22–5.81)
RDW: 16.6 % — ABNORMAL HIGH (ref 11.5–15.5)
WBC: 4.9 10*3/uL (ref 4.0–10.5)

## 2016-03-01 MED ORDER — ONDANSETRON HCL 4 MG/2ML IJ SOLN
4.0000 mg | INTRAMUSCULAR | Status: DC | PRN
  Administered 2016-03-01: 4 mg via INTRAVENOUS
  Filled 2016-03-01: qty 2

## 2016-03-01 MED ORDER — HYDROCODONE-ACETAMINOPHEN 5-325 MG PO TABS: ORAL_TABLET | ORAL | 0 refills | Status: DC

## 2016-03-01 MED ORDER — FENTANYL CITRATE (PF) 100 MCG/2ML IJ SOLN
50.0000 ug | INTRAMUSCULAR | Status: DC | PRN
  Administered 2016-03-01: 50 ug via INTRAVENOUS
  Filled 2016-03-01: qty 2

## 2016-03-01 NOTE — ED Notes (Signed)
Pt transported to xray 

## 2016-03-01 NOTE — ED Notes (Signed)
PO challenge tolerated well by pt.

## 2016-03-01 NOTE — Discharge Instructions (Signed)
Take the prescription as directed.  Call your regular GI doctor on Monday to schedule a follow up appointment within the next 3 days.  Return to the Emergency Department immediately sooner if worsening.

## 2016-03-01 NOTE — ED Triage Notes (Signed)
Pt reports centralized chest pain and RUQ pain that began approx 2 hours ago. Family reports pt has been dizzy intermittently for a couple of days.

## 2016-03-01 NOTE — ED Provider Notes (Signed)
[facility] DEPT Provider Note   CSN: [id number] Arrival date & time: 03/01/16  1704     History   Chief Complaint Chief Complaint  Patient presents with  . Abdominal Pain  . Chest Pain    HPI Wayne Avila is a 25 y.o. male.   Abdominal Pain    Chest Pain   Associated symptoms include abdominal pain.  Pt was seen at 1800.  Per pt, c/o gradual onset and persistence of constant acute flair of his chronic RUQ abd "pain" that began a few hours ago.  Has been associated with mid-lower chest "pain."  Describes the abd pain as "sharp."  Has been associated with vague "dizziness" for the past few days. Denies N/V, no diarrhea, no fevers, no back pain, no rash, no CP/SOB, no black or blood in stools.      Past Medical History:  Diagnosis Date  . Abdominal pain, chronic, right upper quadrant   . ADHD (attention deficit hyperactivity disorder)   . Anxiety   . Asthma   . Bipolar 1 disorder ([place])   . Colitis   . Depression   . GERD (gastroesophageal reflux disease)   . Hearing loss   . HOH (hard of hearing)   . Mental developmental delay 11/15/2012  . PONV (postoperative nausea and vomiting)   . Seizures [facility])     Patient Active Problem List   Diagnosis Date Noted  . Ulcerative colitis ([place]) 12/29/2015  . CAP (community acquired pneumonia)   . 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 Aspiration pneumonitis ([place]) 11/21/2015  . Chronic blood loss anemia 11/21/2015  . Bipolar I disorder, most recent episode depressed ([place])   . Bipolar 1 disorder, manic, moderate ([place]) 11/09/2014  . Suicidal ideation   . Major depressive disorder, recurrent episode, moderate ([place])   . Mental developmental delay 11/15/2012    Past Surgical History:  Procedure Laterality Date  . ADENOIDECTOMY    . BIOPSY N/A 12/07/2012   Procedure: GASTRIC BIOPSIES;  Surgeon: [name], MD;  Location: [facility] ORS;  Service: Endoscopy;  Laterality: N/A;  . BIOPSY N/A 08/30/2013   Procedure: BIOPSY;  Surgeon: [name],  MD;  Location: [facility] ORS;  Service: Endoscopy;  Laterality: N/A;  right colon,transverse colon, left colon, rectal biopsies  . BIOPSY  11/20/2015   Procedure: BIOPSY;  Surgeon: [name], MD;  Location: [facility] ENDO SUITE;  Service: Endoscopy;;  ileal, right colon biopsy, left colon, rectum  . COLONOSCOPY  11/20/2015   Dr. [name]: Severe erythema, edema, ulcers from the anal verge to 20 cm above the anal verge without mucosal sparing, remaining colon and terminal ileum appeared normal. Biopsies from the rectum revealed fulminant active chronic colitis consistent with IBD. Pathology from terminal ileum revealed intramucosal lymphoid aggregates. Remaining colon random biopsies with inactive chronic colitis  . COLONOSCOPY WITH PROPOFOL N/A 08/30/2013   VOZ:DGUYQI mucosa in the terminal ileum/COLITIS/ MILD PROCTITIS. 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  Marland Kitchen COLONOSCOPY WITH PROPOFOL N/A 11/20/2015   Dr. 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 ESOPHAGOGASTRODUODENOSCOPY  11/20/2015   Dr. [name]: Normal exam, stomach biopsied and duodenal biopsy.. Benign biopsies.  . ESOPHAGOGASTRODUODENOSCOPY (EGD) WITH PROPOFOL N/A 12/07/2012   SLF:The mucosa of the esophagus appeared normal Non-erosive gastritis (inflammation) was found in the gastric antrum; multiple biopsies The duodenal mucosa showed no abnormalities in the bulb and second portion of the  duodenum  . ESOPHAGOGASTRODUODENOSCOPY (EGD) WITH PROPOFOL N/A 11/20/2015   Dr. [name]: normal with normal biopsies, negative H.pylori   . TONSILLECTOMY         Home Medications    Prior to Admission medications   Medication Sig Start Date End Date Taking? Authorizing Provider  albuterol (PROAIR HFA) 108 (90 BASE) MCG/ACT inhaler Inhale 2 puffs into the lungs every 4 (four) hours as needed for wheezing. Persistent  dry cough 09/21/13   Sandi Mealy, MD  albuterol (PROVENTIL) (2.5 MG/3ML) 0.083% nebulizer solution Inhale 3 mLs into the lungs every 6 (six) hours as needed. Shortness of breath 11/08/15   Historical Provider, MD  APRISO 0.375 g 24 hr capsule TAKE 4 CAPSULES(1.5 GRAMS) BY MOUTH DAILY 02/14/16   [name], NP  atomoxetine (STRATTERA) 25 MG capsule Take 25 mg by mouth daily. Takes with the 100 mg to make 125 mg daily    Historical Provider, MD  Budesonide (UCERIS) 2 MG/ACT FOAM Place 1 application rectally 2 (two) times daily. For 2 weeks then every evening for 4 weeks Patient not taking: Reported on 02/20/2016 12/18/15   [name], NP  budesonide-formoterol [facility]) 160-4.5 MCG/ACT inhaler Inhale 2 puffs into the lungs 2 (two) times daily.    Historical Provider, MD  dexamethasone (DECADRON) 4 MG tablet Take 1 tablet (4 mg total) by mouth 2 (two) times daily with a meal. Patient not taking: Reported on 02/20/2016 02/03/16   Lily Kocher, PA-C  divalproex (DEPAKOTE) 500 MG DR tablet Take 1 tablet (500 mg total) by mouth 2 (two) times daily. 07/03/15   Penni Bombard, MD  EPINEPHrine 0.3 mg/0.3 mL IJ SOAJ injection Inject 0.3 mg into the muscle once. Reported on 12/18/2015    Historical Provider, MD  ferrous sulfate 325 (65 FE) MG tablet Take 1 tablet (325 mg total) by mouth 3 (three) times daily with meals. Patient not taking: Reported on 02/20/2016 12/31/15   Thurnell Lose, MD  fluticasone [facility]) 50 MCG/ACT nasal spray Place 1 spray into both nostrils daily. Patient taking differently: Place 1 spray into both nostrils daily as needed for allergies.  11/13/14   [name], NP  HYDROcodone-acetaminophen (NORCO) 5-325 MG tablet Take 1 tablet by mouth every 6 (six) hours as needed for moderate pain. Patient not taking: Reported on 02/20/2016 12/18/15   [name], NP  hydrOXYzine (VISTARIL) 25 MG capsule Take 1 capsule (25 mg total) by mouth 3 (three) times daily as needed for itching.  02/03/16   Lily Kocher, PA-C  hyoscyamine (LEVSIN SL) 0.125 MG SL tablet Place 1 tablet (0.125 mg total) under the tongue every 4 (four) hours as needed. 01/22/16   [name], NP  loratadine (CLARITIN) 10 MG tablet Take 10 mg by mouth at bedtime.     Historical Provider, MD  montelukast (SINGULAIR) 10 MG tablet Take 1 tablet (10 mg total) by mouth at bedtime. 11/13/14   [name], NP  ondansetron (ZOFRAN ODT) 8 MG disintegrating tablet Take 1 tablet (8 mg total) by mouth every 8 (eight) hours as needed for nausea or vomiting. 12/25/15   Jola Schmidt, MD  oxyCODONE-acetaminophen (PERCOCET/ROXICET) 5-325 MG tablet Take 1 tablet by mouth every 6 (six) hours as needed. 02/18/16   Milton Ferguson, MD  predniSONE (DELTASONE) 10 MG tablet Take 3 tablets (30 mg total) by mouth daily. Patient not taking: Reported on 02/20/2016 12/31/15   Thurnell Lose, MD  ranitidine (ZANTAC) 150 MG tablet Take  1 tablet (150 mg total) by mouth daily. 11/13/14   [name], NP  sertraline (ZOLOFT) 100 MG tablet Take 1 tablet (100 mg total) by mouth daily. 11/13/14   [name], NP  STRATTERA 100 MG capsule take one capsule by mouth once daily 08/09/15   Historical Provider, MD  traMADol (ULTRAM) 50 MG tablet Take 2 tablets (100 mg total) by mouth every 6 (six) hours as needed. 01/19/16   Rolland Porter, MD  traZODone (DESYREL) 100 MG tablet Take one tablet by mouth once daily at bedtime 08/07/15   Historical Provider, MD  triamcinolone cream (KENALOG) 0.1 % Apply 1 application topically 2 (two) times daily. 02/03/16   Lily Kocher, PA-C    Family History Family History  Problem Relation Age of Onset  . Asthma Mother   . Ulcers Mother   . Bipolar disorder Mother   . ADD / ADHD Father   . Diabetes Maternal Grandmother   . Diabetes Maternal Grandfather   . Colon cancer Neg Hx   . Liver disease Neg Hx     Social History Social History  Substance Use Topics  . Smoking status: Former Smoker    Packs/day: 0.00    Years:  2.00    Quit date: 10/06/2012  . Smokeless tobacco: Never Used     Comment: Never smoked cigarettes  . Alcohol use No     Comment: denies usage     Allergies   Amoxicillin-pot clavulanate; Ibuprofen; Omeprazole; Pineapple; Strawberry extract; and Tomato   Review of Systems Review of Systems  Cardiovascular: Positive for chest pain.  Gastrointestinal: Positive for abdominal pain.  ROS: Statement: All systems negative except as marked or noted in the HPI; Constitutional: Negative for fever and chills. ; ; Eyes: Negative for eye pain, redness and discharge. ; ; ENMT: Negative for ear pain, hoarseness, nasal congestion, sinus pressure and sore throat. ; ; Cardiovascular: Negative for palpitations, diaphoresis, dyspnea and peripheral edema. ; ; Respiratory: Negative for cough, wheezing and stridor. ; ; Gastrointestinal: +chronic RUQ pain. Negative for nausea, vomiting, diarrhea, blood in stool, hematemesis, jaundice and rectal bleeding. . ; ; Genitourinary: Negative for dysuria, flank pain and hematuria. ; ; Musculoskeletal: +CP. Negative for back pain and neck pain. Negative for swelling and trauma.; ; Skin: Negative for pruritus, rash, abrasions, blisters, bruising and skin lesion.; ; Neuro: Negative for headache, lightheadedness and neck stiffness. Negative for weakness, altered level of consciousness, altered mental status, extremity weakness, paresthesias, involuntary movement, seizure and syncope.      Physical Exam Updated Vital Signs BP 135/87 (BP Location: Left Arm)   Pulse 101   Temp 98.9 F (37.2 C) (Oral)   Resp 16   Ht 5' 8"  (1.727 m)   Wt 235 lb (106.6 kg)   SpO2 100%   BMI 35.73 kg/m    19:07 Orthostatic Vital Signs CS  Orthostatic Lying   BP- Lying:  131/93  Pulse- Lying: 126      Orthostatic Sitting  BP- Sitting: 135/72  Pulse- Sitting: 102      Orthostatic Standing at 0 minutes  BP- Standing at 0 minutes: 128/80  Pulse- Standing at 0 minutes: 112      Physical Exam 1805: Physical examination:  Nursing notes reviewed; Vital signs and O2 SAT reviewed;  Constitutional: Well developed, Well nourished, Well hydrated, In no acute distress; Head:  Normocephalic, atraumatic; Eyes: EOMI, PERRL, No scleral icterus; ENMT: Mouth and pharynx normal, Mucous membranes moist; Neck: Supple, Full range of  motion, No lymphadenopathy; Cardiovascular: Regular rate and rhythm, No gallop; Respiratory: Breath sounds clear & equal bilaterally, No wheezes.  Speaking full sentences with ease, Normal respiratory effort/excursion; Chest: Nontender, Movement normal; Abdomen: Soft, +RUQ and mid-epigastric areas tender to palp. No rebound or guarding. Nondistended, Normal bowel sounds; Genitourinary: No CVA tenderness; Extremities: Pulses normal, No tenderness, No edema, No calf edema or asymmetry.; Neuro: AA&Ox3, Major CN grossly intact.  Speech clear. No gross focal motor or sensory deficits in extremities.; Skin: Color normal, Warm, Dry.; Psych:  Affect flat, poor eye contact.    ED Treatments / Results  Labs (all labs ordered are listed, but only abnormal results are displayed)   EKG  EKG Interpretation  Date/Time:  Saturday March 01 2016 17:13:57 EDT Ventricular Rate:  105 PR Interval:  128 QRS Duration: 74 QT Interval:  310 QTC Calculation: 409 R Axis:   28 Text Interpretation:  Sinus tachycardia Nonspecific T wave abnormality When compared with ECG of 10/30/2015 No significant change was found Confirmed by [facility]  MD, Nunzio Cory [id number]) on 03/01/2016 6:19:47 PM       Radiology   Procedures Procedures (including critical care time)  Medications Ordered in ED Medications  fentaNYL (SUBLIMAZE) injection 50 mcg (not administered)  ondansetron (ZOFRAN) injection 4 mg (not administered)     Initial Impression / Assessment and Plan / ED Course  I have reviewed the triage vital signs and the nursing notes.  Pertinent labs & imaging results that  were available during my care of the patient were reviewed by me and considered in my medical decision making (see chart for details).  MDM Reviewed: previous chart, nursing note and vitals Reviewed previous: labs, ECG and CT scan Interpretation: labs, ECG and x-ray   Results for orders placed or performed during the hospital encounter of 03/01/16  Lipase, blood  Result Value Ref Range   Lipase 19 11 - 51 U/L  Comprehensive metabolic panel  Result Value Ref Range   Sodium 138 135 - 145 mmol/L   Potassium 4.2 3.5 - 5.1 mmol/L   Chloride 106 101 - 111 mmol/L   CO2 25 22 - 32 mmol/L   Glucose, Bld 78 65 - 99 mg/dL   BUN 8 6 - 20 mg/dL   Creatinine, Ser 0.86 0.61 - 1.24 mg/dL   Calcium 9.6 8.9 - 10.3 mg/dL   Total Protein 7.2 6.5 - 8.1 g/dL   Albumin 4.1 3.5 - 5.0 g/dL   AST 21 15 - 41 U/L   ALT 26 17 - 63 U/L   Alkaline Phosphatase 50 38 - 126 U/L   Total Bilirubin 0.3 0.3 - 1.2 mg/dL   GFR calc non Af Amer >60 >60 mL/min   GFR calc Af Amer >60 >60 mL/min   Anion gap 7 5 - 15  CBC  Result Value Ref Range   WBC 4.9 4.0 - 10.5 K/uL   RBC 5.32 4.22 - 5.81 MIL/uL   Hemoglobin 15.2 13.0 - 17.0 g/dL   HCT 44.1 39.0 - 52.0 %   MCV 82.9 78.0 - 100.0 fL   MCH 28.6 26.0 - 34.0 pg   MCHC 34.5 30.0 - 36.0 g/dL   RDW 16.6 (H) 11.5 - 15.5 %   Platelets 220 150 - 400 K/uL  Urinalysis, Routine w reflex microscopic  Result Value Ref Range   Color, Urine YELLOW YELLOW   APPearance CLEAR CLEAR   Specific Gravity, Urine 1.025 1.005 - 1.030   pH 6.0 5.0 - 8.0  Glucose, UA NEGATIVE NEGATIVE mg/dL   Hgb urine dipstick NEGATIVE NEGATIVE   Bilirubin Urine NEGATIVE NEGATIVE   Ketones, ur NEGATIVE NEGATIVE mg/dL   Protein, ur NEGATIVE NEGATIVE mg/dL   Nitrite NEGATIVE NEGATIVE   Leukocytes, UA NEGATIVE NEGATIVE  Troponin I  Result Value Ref Range   Troponin I <0.03 <0.03 ng/mL   Dg Chest 2 View Result Date: 03/01/2016 CLINICAL DATA:  Centralized chest pain and right upper quadrant pain  that began 2 hours ago. EXAM: CHEST  2 VIEW COMPARISON:  February 18, 2016 FINDINGS: The heart size and mediastinal contours are within normal limits. Both lungs are clear. The visualized skeletal structures are unremarkable. IMPRESSION: No active cardiopulmonary disease. Electronically Signed   By: Dorise Bullion III M.D   On: 03/01/2016 18:09   Dg Abd 2 Views Result Date: 03/01/2016 CLINICAL DATA:  Acute onset of central chest pain and right upper quadrant abdominal pain. Initial encounter. EXAM: ABDOMEN - 2 VIEW COMPARISON:  Abdominal radiograph performed 02/18/2016 FINDINGS: The visualized bowel gas pattern is unremarkable. Scattered air and stool filled loops of colon are seen; no abnormal dilatation of small bowel loops is seen to suggest small bowel obstruction. No free intra-abdominal air is identified on the provided upright view. The visualized osseous structures are within normal limits; the sacroiliac joints are unremarkable in appearance. IMPRESSION: Unremarkable bowel gas pattern; no free intra-abdominal air seen. Small to moderate amount of stool noted in the colon. Electronically Signed   By: Garald Balding M.D.   On: 03/01/2016 18:31    2000:  Pt has tol PO well while in the ED without N/V.  No stooling while in the ED.  Abd benign, VSS. No clear indication for CT scan at this time. Feels better and wants to go home now. Long hx of chronic abd pain; encouraged to f/u with GI MD for good continuity of care and control of his chronic symptoms. Dx and testing d/w pt and family.  Questions answered.  Verb understanding, agreeable to d/c home with outpt f/u.     Final Clinical Impressions(s) / ED Diagnoses   Final diagnoses:  Abdominal pain    New Prescriptions New Prescriptions   No medications on file      Francine Graven, DO 03/04/16 1759

## 2016-03-07 ENCOUNTER — Telehealth: Payer: Self-pay

## 2016-03-07 ENCOUNTER — Ambulatory Visit (INDEPENDENT_AMBULATORY_CARE_PROVIDER_SITE_OTHER): Payer: Medicaid Other | Admitting: Nurse Practitioner

## 2016-03-07 ENCOUNTER — Telehealth: Payer: Self-pay | Admitting: Nurse Practitioner

## 2016-03-07 ENCOUNTER — Encounter: Payer: Self-pay | Admitting: Nurse Practitioner

## 2016-03-07 VITALS — BP 143/94 | HR 94 | Temp 98.1°F | Ht 65.0 in | Wt 240.8 lb

## 2016-03-07 DIAGNOSIS — K51011 Ulcerative (chronic) pancolitis with rectal bleeding: Secondary | ICD-10-CM

## 2016-03-07 DIAGNOSIS — K6289 Other specified diseases of anus and rectum: Secondary | ICD-10-CM | POA: Diagnosis not present

## 2016-03-07 DIAGNOSIS — R197 Diarrhea, unspecified: Secondary | ICD-10-CM | POA: Insufficient documentation

## 2016-03-07 MED ORDER — BUDESONIDE 2 MG/ACT RE FOAM: 2.0000 mg | Freq: Two times a day (BID) | RECTAL | 1 refills | Status: AC

## 2016-03-07 NOTE — Progress Notes (Addendum)
REVIEWED-NO ADDITIONAL RECOMMENDATIONS.  Referring Provider: Nolene Ebbs, MD Primary Care Physician:  [name], MD Primary GI:  Dr. [name]  Chief Complaint  Patient presents with  . Abdominal Pain    HPI:   Wayne Avila is a 25 y.o. male who presents with a complicated history of ulcerative colitis with pancolitis and biopsies showing chronic inactive colitis and rectal biopsies with severe proctitis. Is on Remicade, first dose 12/27/2015. Had a hospital admission for ulcerative colitis flare 12/28/2015 through 12/31/2015 which improved with IV steroids, antibiotics, steroid taper. History of IDA with IV Feraheme while inpatient an improved hemoglobin. At office visit on 01/21/2016 noted abdominal pain 1-2 days after Remicade for which he presented to the emergency department and was heme positive. Diarrhea over the several previous days and intermittent bleeding with [place] about 5-6 loose stools a day. He was tolerating from enemas at that time. Recommended consider adding Imuran in October 2017 dependent on clinical response to Remicade.  Last office visit 02/20/2016 for ulcerative pancolitis with rectal bleeding and iron deficiency anemia. At that time symptoms were controlled/resolved and with constipation. Last CT August 2017 with mild proctitis. Was given recommendations for high-fiber diet, MiraLAX, Linzess for constipation. Recommended continue Remicade, flu shot, pneumonia series, labs ordered including hepatitis A antibody, hepatitis B surface antigen, vitamin D all of which appear to have not been completed.  The patient' mom called our office this morning with complaints of of significant GI bleeding, abdominal pain and requesting same-day visit. He is a frequent flyer in the emergency department. Also noted some diarrhea, several times a day with pain medications not effective. Recommended visit today with exam, start Uceris found and stool studies. Consider short dose of  steroids if stool studies negative. We'll likely need to start Imuran as previously planned.  Today he is accompanied by his mom. Today he states he started having worsening stools last night. Was up all night, no sleep due to bowel movements. More than 10 since last night. Described as runny and with blood. Also abdominal cramping across lower abdomen. Last Remicade August 30 or 31, next dose end of October. Remicade not listed on med list. Vicente Males stopped rectal foam. Got flu shot. Tried to get pneumonia shot but insurance wouldn't pay for it. Denies rectal pain. Denies recent travel or changes in diet. Denies fever, chills. Denies chest pain, dyspnea, dizziness, lightheadedness, syncope, near syncope. Denies any other upper or lower GI symptoms.  Past Medical History:  Diagnosis Date  . Abdominal pain, chronic, right upper quadrant   . ADHD (attention deficit hyperactivity disorder)   . Anxiety   . Asthma   . Bipolar 1 disorder ([place])   . Colitis   . Depression   . GERD (gastroesophageal reflux disease)   . Hearing loss   . HOH (hard of hearing)   . Mental developmental delay 11/15/2012  . PONV (postoperative nausea and vomiting)   . Seizures ([place])     Past Surgical History:  Procedure Laterality Date  . ADENOIDECTOMY    . BIOPSY N/A 12/07/2012   Procedure: GASTRIC BIOPSIES;  Surgeon: [name], MD;  Location: [facility];  Service: Endoscopy;  Laterality: N/A;  . BIOPSY N/A 08/30/2013   Procedure: BIOPSY;  Surgeon: [name], MD;  Location: [facility];  Service: Endoscopy;  Laterality: N/A;  right colon,transverse colon, left colon, rectal biopsies  . BIOPSY  11/20/2015   Procedure: BIOPSY;  Surgeon: [name], MD;  Location: [facility]  ENDO SUITE;  Service: Endoscopy;;  ileal, right colon biopsy, left colon, rectum  . COLONOSCOPY  11/20/2015   Dr. [name]: Severe erythema, edema, ulcers from the anal verge to 20 cm above the anal verge without mucosal sparing, remaining colon and terminal  ileum appeared normal. Biopsies from the rectum revealed fulminant active chronic colitis consistent with IBD. Pathology from terminal ileum revealed intramucosal lymphoid aggregates. Remaining colon random biopsies with inactive chronic colitis  . COLONOSCOPY WITH PROPOFOL N/A 08/30/2013   YQI:HKVQQV mucosa in the terminal ileum/COLITIS/ MILD PROCTITIS. 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  Marland Kitchen COLONOSCOPY WITH PROPOFOL N/A 11/20/2015   Dr. 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 ESOPHAGOGASTRODUODENOSCOPY  11/20/2015   Dr. [name]: Normal exam, stomach biopsied and duodenal biopsy.. Benign biopsies.  . ESOPHAGOGASTRODUODENOSCOPY (EGD) WITH PROPOFOL N/A 12/07/2012   [facility]:The mucosa of the esophagus appeared normal Non-erosive gastritis (inflammation) was found in the gastric antrum; multiple biopsies The duodenal mucosa showed no abnormalities in the bulb and second portion of the duodenum  . ESOPHAGOGASTRODUODENOSCOPY (EGD) WITH PROPOFOL N/A 11/20/2015   Dr. [name]: normal with normal biopsies, negative H.pylori   . TONSILLECTOMY      Current Outpatient Prescriptions  Medication Sig Dispense Refill  . albuterol (PROVENTIL HFA;VENTOLIN HFA) 108 (90 Base) MCG/ACT inhaler Inhale 2 puffs into the lungs every 4 (four) hours as needed for wheezing or shortness of breath (and/or cough).    Marland Kitchen albuterol (PROVENTIL) (2.5 MG/3ML) 0.083% nebulizer solution Inhale 2.5 mg into the lungs every 6 (six) hours as needed for wheezing or shortness of breath.   11  . atomoxetine (STRATTERA) 100 MG capsule Take 100 mg by mouth daily. Pt takes with a 18m capsule.    .Marland Kitchenatomoxetine (STRATTERA) 25 MG capsule Take 25 mg by mouth daily. Pt takes with a 1014mcapsule.    . budesonide-formoterol (SYMBICORT) 160-4.5 MCG/ACT inhaler Inhale 2 puffs into the lungs 2 (two) times  daily.    . divalproex (DEPAKOTE) 500 MG DR tablet Take 1 tablet (500 mg total) by mouth 2 (two) times daily. 60 tablet 2  . EPINEPHrine (EPIPEN 2-PAK) 0.3 mg/0.3 mL IJ SOAJ injection Inject 0.3 mg into the muscle once as needed (for severe allergic reaction).    . fluticasone (FLONASE) 50 MCG/ACT nasal spray Place 1-2 sprays into both nostrils daily as needed for rhinitis.    . Marland KitchenYDROcodone-acetaminophen (NORCO/VICODIN) 5-325 MG tablet 1 or 2 tabs PO q6 hours prn pain 8 tablet 0  . loratadine (CLARITIN) 10 MG tablet Take 10 mg by mouth at bedtime.     . montelukast (SINGULAIR) 10 MG tablet Take 1 tablet (10 mg total) by mouth at bedtime. 30 tablet 3  . ondansetron (ZOFRAN ODT) 8 MG disintegrating tablet Take 1 tablet (8 mg total) by mouth every 8 (eight) hours as needed for nausea or vomiting. 10 tablet 0  . oxyCODONE-acetaminophen (PERCOCET/ROXICET) 5-325 MG tablet Take 1 tablet by mouth every 6 (six) hours as needed for severe pain.    . ranitidine (ZANTAC) 150 MG tablet Take 1 tablet (150 mg total) by mouth daily.    . sertraline (ZOLOFT) 100 MG tablet Take 1 tablet (100 mg total) by mouth daily. 30 tablet 0  . traMADol (ULTRAM) 50 MG tablet Take 100 mg by mouth every 6 (six) hours as needed for moderate pain.    . traZODone (DESYREL) 100 MG  tablet Take 100 mg by mouth at bedtime.    . triamcinolone cream (KENALOG) 0.1 % Apply 1 application topically 2 (two) times daily as needed (for itching).     No current facility-administered medications for this visit.     Allergies as of 03/07/2016 - Review Complete 03/07/2016  Allergen Reaction Noted  . Amoxicillin-pot clavulanate Nausea And Vomiting and Other (See Comments) 02/28/2011  . Ibuprofen Other (See Comments) 07/03/2015  . Omeprazole Nausea And Vomiting 04/20/2013  . Pineapple Swelling and Other (See Comments) 04/14/2011  . Strawberry extract Swelling and Other (See Comments) 05/08/2012  . Tomato Rash 04/14/2011    Family History    Problem Relation Age of Onset  . Asthma Mother   . Ulcers Mother   . Bipolar disorder Mother   . ADD / ADHD Father   . Diabetes Maternal Grandmother   . Diabetes Maternal Grandfather   . Colon cancer Neg Hx   . Liver disease Neg Hx     Social History   Social History  . Marital status: Single    Spouse name: N/A  . Number of children: 0  . Years of education: 12th   Occupational History  .  Not Employed    not working   Social History Main Topics  . Smoking status: Former Smoker    Packs/day: 0.00    Years: 2.00    Quit date: 10/06/2012  . Smokeless tobacco: Never Used     Comment: Never smoked cigarettes  . Alcohol use No     Comment: denies usage  . Drug use: No  . Sexual activity: Yes   Other Topics Concern  . None   Social History Narrative   Patient lives at home with girlfriend and family.   Caffeine Use: occasionally    Review of Systems: Complete ROS negative except as per HPI.   Physical Exam: BP (!) 143/94   Pulse 94   Temp 98.1 F (36.7 C) (Oral)   Ht 5' 5"  (1.651 m)   Wt 240 lb 12.8 oz (109.2 kg)   BMI 40.07 kg/m  General:   Alert and oriented. Well-nourished and well-developed. Laying on exam table, no acute distress. Head:  Normocephalic and atraumatic. Eyes:  Without icterus, sclera clear and conjunctiva pink.  Ears:  Normal auditory acuity. Cardiovascular:  S1, S2 present without murmurs appreciated. Extremities without clubbing or edema. Respiratory:  Clear to auscultation bilaterally. No wheezes, rales, or rhonchi. No distress.  Gastrointestinal:  +BS, rounded but soft, and non-distended. Abdominal TTP generalized abdomen. No HSM noted. No guarding or rebound. Rectal:  Deferred  Musculoskalatal:  Symmetrical without gross deformities. Neurologic:  Alert and oriented x4;  grossly normal neurologically. Psych:  Alert and cooperative. Normal mood and affect. Heme/Lymph/Immune: No excessive bruising noted.    03/07/2016 11:25  AM   Disclaimer: This note was dictated with voice recognition software. Similar sounding words can inadvertently be transcribed and may not be corrected upon review.

## 2016-03-07 NOTE — Telephone Encounter (Signed)
Pt's mother called this morning to see about coming in today because he is bleeding very bad and having stomach pains. I told her that we did not have any appointment and if he was bleeding that bad that he needed to go to the ER to get checked out.

## 2016-03-07 NOTE — Telephone Encounter (Signed)
PT's mom returned my call. Pt is having bad RUQ pain. A lot of rectal bleeding.  Some diarrhea, several times a day.  He is taking Percocet and that is not helping, it was given to him at the ED because Hydrocodone did not help. He has not left yet for the ED, please advise!

## 2016-03-07 NOTE — Telephone Encounter (Signed)
The patient said insurance wouldn't cover pneumonia vaccine. Can we call and see if we can get a PA for it because of UC with medications planned that will make him immunocompromised and high risk for infection?  Thanks!

## 2016-03-07 NOTE — Assessment & Plan Note (Addendum)
History of ulcerative colitis, pancolitis though inactive at last colonoscopy. Patient is on Remicade every 6 weeks, last seeped dose and of August and next scheduled dose mid October. I will check CRP today. Will discuss with Dr. [name] and follow-up in 2 weeks.  Please note: Patient received flu shot. Tried to receive pneumonia vaccine but was denied by insurance. Will work to see if we can get pneumonia shot covered under the auspices of chronic disease with immunocompromising medications.

## 2016-03-07 NOTE — Telephone Encounter (Signed)
LMOM ( both numbers) for a return call to discuss symptoms.

## 2016-03-07 NOTE — Assessment & Plan Note (Signed)
The patient was having abdominal pain as noted above in addition to frequent stools as noted above and complaints of rectal bleeding. This point I'll start you Sarah's rectal foam to help alleviate his symptoms over the weekend. We'll discuss with Dr. [name] and follow-up in 2 weeks.

## 2016-03-07 NOTE — Telephone Encounter (Signed)
Has he already gone to the ED? I can send in Uceris foam again to see if this helps calm down the bleeding. Is he having diarrhea or anything else like that? What are his other symptoms?

## 2016-03-07 NOTE — Assessment & Plan Note (Signed)
Worsening diarrhea since yesterday with multiple stools overnight (admits more than 10). Likely related to history of ulcerative colitis and proctitis. However, has had C. difficile in the past and cannot rule out colon infection. At this point I will order C. difficile PCR and toxins as well as GI pathogen panel. Will discuss further with Dr. [name] and recommend follow-up with her in 2 weeks.

## 2016-03-07 NOTE — Patient Instructions (Signed)
1. We will give these cups to collect stool samples. Bring these to the lab. 2. Have your blood work drawn when you bring her stool samples to the lab. 3. We'll give you an iFOBT cartridge to check for blood. Bring this test back to our office. 4. Start taking Uceris rectal foam twice a day for the next 2 weeks. 5. I will follow up with Dr. [name] and have you come back in 2 weeks.

## 2016-03-07 NOTE — Telephone Encounter (Signed)
PT's mom is aware that he can be seen today at 11:00 Am by Walden Field, NP.  She is to have pt here at 10:45 Am.

## 2016-03-07 NOTE — Telephone Encounter (Signed)
Patient has chronic abdominal pain. He has been sen MULTIPLE times in the ED. His last CT was Aug 2017, and this was for RUQ abdominal pain. We are really trying to avoid ED visits unless it is absolutely necessary. If there is an urgent slot with a provider today, let's do that. He needs to be examined, have stool studies, and start Uceris foam. He may need a short dose of steroids if stool studies are negative. He may ultimately need to start Imuran as per plan by Dr. [name] along with Remicade.

## 2016-03-10 ENCOUNTER — Encounter: Payer: Self-pay | Admitting: Gastroenterology

## 2016-03-10 NOTE — Progress Notes (Signed)
CC'D TO PCP °

## 2016-03-12 ENCOUNTER — Telehealth: Payer: Self-pay

## 2016-03-12 ENCOUNTER — Ambulatory Visit (INDEPENDENT_AMBULATORY_CARE_PROVIDER_SITE_OTHER): Payer: Medicaid Other

## 2016-03-12 DIAGNOSIS — K51011 Ulcerative (chronic) pancolitis with rectal bleeding: Secondary | ICD-10-CM | POA: Diagnosis not present

## 2016-03-12 NOTE — Telephone Encounter (Signed)
I received a phone call from [place], from [facility], saying pt dropped off bottles for stool but there was actually not hardly a drop in the bottles. She told them he had labs ordered also and mom said no he doesn't need any labs.  Selma called pt to inform not enough stool in the containers, and she said he did not have much to say about it. She asked hi to tell his mom and have her call.  Selma just called to let us know what is going on.  Pt has lab orders/stool orders from Walden Field, NP and Dr. [name] in epic.

## 2016-03-12 NOTE — Telephone Encounter (Signed)
Pt is aware.  

## 2016-03-12 NOTE — Telephone Encounter (Signed)
PLEASE CALL PT. No need to collect stools if he's not having diarrhea.

## 2016-03-13 LAB — IFOBT (OCCULT BLOOD): IFOBT: POSITIVE

## 2016-03-13 NOTE — Telephone Encounter (Signed)
I called the [facility] Department and there is a$3 copy for pneumonia vaccines, he will need a doctor's order and he can come in as a walk-in Monday-Friday 8:00-4:30  Patient made aware

## 2016-03-15 ENCOUNTER — Telehealth: Payer: Self-pay | Admitting: Internal Medicine

## 2016-03-15 LAB — CBC WITH DIFFERENTIAL/PLATELET
Basophils Absolute: 55 cells/uL (ref 0–200)
Basophils Relative: 1 %
Eosinophils Absolute: 165 cells/uL (ref 15–500)
Eosinophils Relative: 3 %
HCT: 43 % (ref 38.5–50.0)
Hemoglobin: 14.5 g/dL (ref 13.2–17.1)
Lymphocytes Relative: 29 %
Lymphs Abs: 1595 cells/uL (ref 850–3900)
MCH: 27.8 pg (ref 27.0–33.0)
MCHC: 33.7 g/dL (ref 32.0–36.0)
MCV: 82.5 fL (ref 80.0–100.0)
MPV: 10.6 fL (ref 7.5–12.5)
Monocytes Absolute: 1210 cells/uL — ABNORMAL HIGH (ref 200–950)
Monocytes Relative: 22 %
Neutro Abs: 2475 cells/uL (ref 1500–7800)
Neutrophils Relative %: 45 %
Platelets: 273 10*3/uL (ref 140–400)
RBC: 5.21 MIL/uL (ref 4.20–5.80)
RDW: 17.5 % — ABNORMAL HIGH (ref 11.0–15.0)
WBC: 5.5 10*3/uL (ref 3.8–10.8)

## 2016-03-15 LAB — C-REACTIVE PROTEIN: CRP: 9.6 mg/L — ABNORMAL HIGH (ref <8.0)

## 2016-03-15 LAB — CLOSTRIDIUM DIFFICILE BY PCR: Toxigenic C. Difficile by PCR: DETECTED — CR

## 2016-03-15 NOTE — Telephone Encounter (Signed)
Lab called. C diff positive.  Apparently Ist relapse.  Called mom and informed.  Its after hrs - weekend. Regular pharmacy closed.  Called [facility] -no vancomycin til next week.  That first choice, given protracted diarrheal illness and co-morbidities, will get him back on treatment in the way of a second course of Flagyl 573m TID x 14 days. Sanitize with bleach.     May need still need Vancomycin depending.g on clinical course.at som point.  Pt to F/U with [facility]/

## 2016-03-17 ENCOUNTER — Encounter (HOSPITAL_COMMUNITY): Payer: Self-pay | Admitting: *Deleted

## 2016-03-17 DIAGNOSIS — F909 Attention-deficit hyperactivity disorder, unspecified type: Secondary | ICD-10-CM | POA: Diagnosis not present

## 2016-03-17 DIAGNOSIS — Z87891 Personal history of nicotine dependence: Secondary | ICD-10-CM | POA: Diagnosis not present

## 2016-03-17 DIAGNOSIS — A0472 Enterocolitis due to Clostridium difficile, not specified as recurrent: Secondary | ICD-10-CM | POA: Insufficient documentation

## 2016-03-17 DIAGNOSIS — R109 Unspecified abdominal pain: Secondary | ICD-10-CM | POA: Diagnosis present

## 2016-03-17 DIAGNOSIS — J45909 Unspecified asthma, uncomplicated: Secondary | ICD-10-CM | POA: Insufficient documentation

## 2016-03-17 DIAGNOSIS — Z79899 Other long term (current) drug therapy: Secondary | ICD-10-CM | POA: Diagnosis not present

## 2016-03-17 LAB — GASTROINTESTINAL PATHOGEN PANEL PCR
C. difficile Tox A/B, PCR: DETECTED — CR
Campylobacter, PCR: NOT DETECTED
Cryptosporidium, PCR: NOT DETECTED
E coli (ETEC) LT/ST PCR: NOT DETECTED
E coli (STEC) stx1/stx2, PCR: NOT DETECTED
E coli 0157, PCR: NOT DETECTED
Giardia lamblia, PCR: NOT DETECTED
Norovirus, PCR: NOT DETECTED
Rotavirus A, PCR: NOT DETECTED
Salmonella, PCR: NOT DETECTED
Shigella, PCR: NOT DETECTED

## 2016-03-17 NOTE — Telephone Encounter (Signed)
REVIEWED-NO ADDITIONAL RECOMMENDATIONS. 

## 2016-03-17 NOTE — Progress Notes (Signed)
PT and mom aware.

## 2016-03-17 NOTE — Telephone Encounter (Signed)
Noted  

## 2016-03-17 NOTE — Progress Notes (Signed)
Pt and mom aware.

## 2016-03-17 NOTE — ED Triage Notes (Signed)
Pt c/o abdominal pain; pt was diagnosed with c-diff x 2 days ago; pt c/o diarrhea

## 2016-03-18 ENCOUNTER — Emergency Department (HOSPITAL_COMMUNITY): Payer: Medicaid Other

## 2016-03-18 ENCOUNTER — Emergency Department (HOSPITAL_COMMUNITY)
Admission: EM | Admit: 2016-03-18 | Discharge: 2016-03-18 | Disposition: A | Discharge status: Home / Self Care | Payer: Medicaid Other | Attending: Emergency Medicine | Admitting: Emergency Medicine

## 2016-03-18 ENCOUNTER — Telehealth: Payer: Self-pay | Admitting: Gastroenterology

## 2016-03-18 DIAGNOSIS — A0472 Enterocolitis due to Clostridium difficile, not specified as recurrent: Secondary | ICD-10-CM

## 2016-03-18 DIAGNOSIS — R109 Unspecified abdominal pain: Secondary | ICD-10-CM

## 2016-03-18 LAB — COMPREHENSIVE METABOLIC PANEL
ALT: 16 U/L — ABNORMAL LOW (ref 17–63)
AST: 16 U/L (ref 15–41)
Albumin: 3.5 g/dL (ref 3.5–5.0)
Alkaline Phosphatase: 49 U/L (ref 38–126)
Anion gap: 4 — ABNORMAL LOW (ref 5–15)
BUN: 7 mg/dL (ref 6–20)
CO2: 25 mmol/L (ref 22–32)
Calcium: 9.1 mg/dL (ref 8.9–10.3)
Chloride: 106 mmol/L (ref 101–111)
Creatinine, Ser: 0.95 mg/dL (ref 0.61–1.24)
GFR calc Af Amer: >60 mL/min (ref >60)
GFR calc non Af Amer: >60 mL/min (ref >60)
Glucose, Bld: 99 mg/dL (ref 65–99)
Potassium: 3.7 mmol/L (ref 3.5–5.1)
Sodium: 135 mmol/L (ref 135–145)
Total Bilirubin: 0.3 mg/dL (ref 0.3–1.2)
Total Protein: 6.5 g/dL (ref 6.5–8.1)

## 2016-03-18 LAB — URINALYSIS, ROUTINE W REFLEX MICROSCOPIC
Bilirubin Urine: NEGATIVE
Glucose, UA: NEGATIVE mg/dL
Hgb urine dipstick: NEGATIVE
Ketones, ur: NEGATIVE mg/dL
Leukocytes, UA: NEGATIVE
Nitrite: NEGATIVE
Protein, ur: NEGATIVE mg/dL
Specific Gravity, Urine: 1.02 (ref 1.005–1.030)
pH: 6 (ref 5.0–8.0)

## 2016-03-18 LAB — CBC WITH DIFFERENTIAL/PLATELET
Basophils Absolute: 0 10*3/uL (ref 0.0–0.1)
Basophils Relative: 0 %
Eosinophils Absolute: 0.2 10*3/uL (ref 0.0–0.7)
Eosinophils Relative: 3 %
HCT: 38.8 % — ABNORMAL LOW (ref 39.0–52.0)
Hemoglobin: 13.1 g/dL (ref 13.0–17.0)
Lymphocytes Relative: 40 %
Lymphs Abs: 2.2 10*3/uL (ref 0.7–4.0)
MCH: 27.9 pg (ref 26.0–34.0)
MCHC: 33.8 g/dL (ref 30.0–36.0)
MCV: 82.6 fL (ref 78.0–100.0)
Monocytes Absolute: 1 10*3/uL (ref 0.1–1.0)
Monocytes Relative: 19 %
Neutro Abs: 2.1 10*3/uL (ref 1.7–7.7)
Neutrophils Relative %: 38 %
Platelets: 233 10*3/uL (ref 150–400)
RBC: 4.7 MIL/uL (ref 4.22–5.81)
RDW: 15.6 % — ABNORMAL HIGH (ref 11.5–15.5)
WBC: 5.5 10*3/uL (ref 4.0–10.5)

## 2016-03-18 LAB — LIPASE, BLOOD: Lipase: 21 U/L (ref 11–51)

## 2016-03-18 MED ORDER — SODIUM CHLORIDE 0.9 % IV BOLUS (SEPSIS)
1000.0000 mL | Freq: Once | INTRAVENOUS | Status: AC
  Administered 2016-03-18: 1000 mL via INTRAVENOUS

## 2016-03-18 MED ORDER — IOPAMIDOL (ISOVUE-300) INJECTION 61%
INTRAVENOUS | Status: AC
  Filled 2016-03-18: qty 30

## 2016-03-18 MED ORDER — MORPHINE SULFATE (PF) 4 MG/ML IV SOLN
4.0000 mg | Freq: Once | INTRAVENOUS | Status: AC
  Administered 2016-03-18: 4 mg via INTRAVENOUS
  Filled 2016-03-18: qty 1

## 2016-03-18 MED ORDER — ONDANSETRON HCL 4 MG/2ML IJ SOLN
4.0000 mg | Freq: Once | INTRAMUSCULAR | Status: AC
  Administered 2016-03-18: 4 mg via INTRAVENOUS
  Filled 2016-03-18: qty 2

## 2016-03-18 MED ORDER — PROMETHAZINE HCL 12.5 MG PO TABS: ORAL_TABLET | ORAL | 0 refills | Status: DC

## 2016-03-18 MED ORDER — IOPAMIDOL (ISOVUE-300) INJECTION 61%
100.0000 mL | Freq: Once | INTRAVENOUS | Status: AC | PRN
  Administered 2016-03-18: 100 mL via INTRAVENOUS

## 2016-03-18 MED ORDER — HYDROMORPHONE HCL 1 MG/ML IJ SOLN
1.0000 mg | Freq: Once | INTRAMUSCULAR | Status: AC
  Administered 2016-03-18: 1 mg via INTRAVENOUS
  Filled 2016-03-18: qty 1

## 2016-03-18 NOTE — Telephone Encounter (Addendum)
PLEASE CALL PT'S MOTHER. HE HAS C DIFF. THIS WILL CAUSE ABDOMINAL  PAIN. THE FLAGYL CAN CAUSE NAUSEA. RX FOR PHENERGAN SENT TO [facility]. HE SHOULD TAKE A DOSE OF PHENERGAN TWICE DAILY. HE CAN COMPLETE THE FLAGYL OR CHANGE TO VANCOMYCIN. SHE SHOULD LET us KNOW WHAT SHE WOULD LIKE TO DO.

## 2016-03-18 NOTE — ED Provider Notes (Signed)
[facility] DEPT Provider Note   CSN: [id number] Arrival date & time: 03/17/16  2333     History   Chief Complaint Chief Complaint  Patient presents with  . Abdominal Pain    HPI Wayne Avila is a 25 y.o. male.  HPI  This is a 25 year old male with a history of abdominal pain, colitis, bipolar disorder, depression who presents with abdominal pain. Patient was recently diagnosed with C. difficile colitis. He is currently on Flagyl. He reports worsening right-sided abdominal pain. He describes as crampy. It is 10 out of 10. It is not better with Percocet. He reports ongoing diarrhea which occasionally has blood in it. He is followed by Dr. [name]. Reports decreased urine output and concerns for dehydration. No fevers.  Past Medical History:  Diagnosis Date  . Abdominal pain, chronic, right upper quadrant   . ADHD (attention deficit hyperactivity disorder)   . Anxiety   . Asthma   . Bipolar 1 disorder ([place])   . Colitis   . Depression   . GERD (gastroesophageal reflux disease)   . Hearing loss   . HOH (hard of hearing)   . Mental developmental delay 11/15/2012  . PONV (postoperative nausea and vomiting)   . Seizures [facility])     Patient Active Problem List   Diagnosis Date Noted  . Diarrhea 03/07/2016  . Ulcerative colitis ([place]) 12/29/2015  . CAP (community acquired pneumonia)   . 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 Aspiration pneumonitis ([place]) 11/21/2015  . Chronic blood loss anemia 11/21/2015  . Bipolar I disorder, most recent episode depressed ([place])   . Bipolar 1 disorder, manic, moderate ([place]) 11/09/2014  . Suicidal ideation   . Major depressive disorder, recurrent episode, moderate ([place])   . Mental developmental delay 11/15/2012    Past Surgical History:  Procedure Laterality Date  . ADENOIDECTOMY    . BIOPSY N/A 12/07/2012   Procedure: GASTRIC BIOPSIES;  Surgeon: [name], MD;  Location: [facility] ORS;  Service: Endoscopy;  Laterality: N/A;  . BIOPSY N/A 08/30/2013   Procedure: BIOPSY;  Surgeon: [name], MD;  Location: [facility] ORS;  Service: Endoscopy;  Laterality: N/A;  right colon,transverse colon, left colon, rectal biopsies  . BIOPSY  11/20/2015   Procedure: BIOPSY;  Surgeon: [name], MD;  Location: [facility] ENDO SUITE;  Service: Endoscopy;;  ileal, right colon biopsy, left colon, rectum  . COLONOSCOPY  11/20/2015   Dr. [name]: Severe erythema, edema, ulcers from the anal verge to 20 cm above the anal verge without mucosal sparing, remaining colon and terminal ileum appeared normal. Biopsies from the rectum revealed fulminant active chronic colitis consistent with IBD. Pathology from terminal ileum revealed intramucosal lymphoid aggregates. Remaining colon random biopsies with inactive chronic colitis  . COLONOSCOPY WITH PROPOFOL N/A 08/30/2013   WUJ:WJXBJY mucosa in the terminal ileum/COLITIS/ MILD PROCTITIS. 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  Marland Kitchen COLONOSCOPY WITH PROPOFOL N/A 11/20/2015   Dr. 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 ESOPHAGOGASTRODUODENOSCOPY  11/20/2015   Dr. [name]: Normal exam, stomach biopsied and duodenal biopsy.. Benign biopsies.  . ESOPHAGOGASTRODUODENOSCOPY (EGD) WITH PROPOFOL N/A 12/07/2012   SLF:The mucosa of the esophagus appeared normal Non-erosive gastritis (inflammation) was found in the gastric antrum; multiple biopsies The duodenal mucosa showed no abnormalities in the bulb and second portion of the duodenum  . ESOPHAGOGASTRODUODENOSCOPY (EGD) WITH PROPOFOL N/A 11/20/2015   Dr. [name]:  normal with normal biopsies, negative H.pylori   . TONSILLECTOMY         Home Medications    Prior to Admission medications   Medication Sig Start Date End Date Taking? Authorizing Provider  albuterol (PROVENTIL HFA;VENTOLIN HFA) 108 (90 Base) MCG/ACT inhaler Inhale 2 puffs into the  lungs every 4 (four) hours as needed for wheezing or shortness of breath (and/or cough).    Historical Provider, MD  albuterol (PROVENTIL) (2.5 MG/3ML) 0.083% nebulizer solution Inhale 2.5 mg into the lungs every 6 (six) hours as needed for wheezing or shortness of breath.     Historical Provider, MD  atomoxetine (STRATTERA) 100 MG capsule Take 100 mg by mouth daily. Pt takes with a 37m capsule.    Historical Provider, MD  atomoxetine (STRATTERA) 25 MG capsule Take 25 mg by mouth daily. Pt takes with a 1078mcapsule.    Historical Provider, MD  Budesonide (UCERIS) 2 MG/ACT FOAM Place 2 mg rectally 2 (two) times daily. 03/07/16 03/21/16  ErCarlis StableNP  budesonide-formoterol (SYMBICORT) 160-4.5 MCG/ACT inhaler Inhale 2 puffs into the lungs 2 (two) times daily.    Historical Provider, MD  divalproex (DEPAKOTE) 500 MG DR tablet Take 1 tablet (500 mg total) by mouth 2 (two) times daily. 07/03/15   ViPenni BombardMD  EPINEPHrine (EPIPEN 2-PAK) 0.3 mg/0.3 mL IJ SOAJ injection Inject 0.3 mg into the muscle once as needed (for severe allergic reaction).    Historical Provider, MD  fluticasone (FLONASE) 50 MCG/ACT nasal spray Place 1-2 sprays into both nostrils daily as needed for rhinitis.    Historical Provider, MD  HYDROcodone-acetaminophen (NORCO/VICODIN) 5-325 MG tablet 1 or 2 tabs PO q6 hours prn pain 03/01/16   KaFrancine GravenDO  loratadine (CLARITIN) 10 MG tablet Take 10 mg by mouth at bedtime.     Historical Provider, MD  montelukast (SINGULAIR) 10 MG tablet Take 1 tablet (10 mg total) by mouth at bedtime. 11/13/14   LaNiel HummerNP  ondansetron (ZOFRAN ODT) 8 MG disintegrating tablet Take 1 tablet (8 mg total) by mouth every 8 (eight) hours as needed for nausea or vomiting. 12/25/15   KeJola SchmidtMD  oxyCODONE-acetaminophen (PERCOCET/ROXICET) 5-325 MG tablet Take 1 tablet by mouth every 6 (six) hours as needed for severe pain.    Historical Provider, MD  ranitidine (ZANTAC) 150 MG tablet Take  1 tablet (150 mg total) by mouth daily. 11/13/14   LaNiel HummerNP  sertraline (ZOLOFT) 100 MG tablet Take 1 tablet (100 mg total) by mouth daily. 11/13/14   LaNiel HummerNP  traMADol (ULTRAM) 50 MG tablet Take 100 mg by mouth every 6 (six) hours as needed for moderate pain.    Historical Provider, MD  traZODone (DESYREL) 100 MG tablet Take 100 mg by mouth at bedtime.    Historical Provider, MD  triamcinolone cream (KENALOG) 0.1 % Apply 1 application topically 2 (two) times daily as needed (for itching).    Historical Provider, MD    Family History Family History  Problem Relation Age of Onset  . Asthma Mother   . Ulcers Mother   . Bipolar disorder Mother   . ADD / ADHD Father   . Diabetes Maternal Grandmother   . Diabetes Maternal Grandfather   . Colon cancer Neg Hx   . Liver disease Neg Hx     Social History Social History  Substance Use Topics  . Smoking status: Former Smoker    Packs/day: 0.00  Years: 2.00    Quit date: 10/06/2012  . Smokeless tobacco: Never Used     Comment: Never smoked cigarettes  . Alcohol use No     Comment: denies usage     Allergies   Amoxicillin-pot clavulanate; Ibuprofen; Omeprazole; Pineapple; Strawberry extract; and Tomato   Review of Systems Review of Systems  Constitutional: Negative for fever.  Respiratory: Negative for shortness of breath.   Cardiovascular: Negative for chest pain.  Gastrointestinal: Positive for abdominal pain, blood in stool and diarrhea. Negative for nausea and vomiting.  Genitourinary: Positive for decreased urine volume.  All other systems reviewed and are negative.    Physical Exam Updated Vital Signs BP (!) 106/45   Pulse 115   Temp 98.1 F (36.7 C) (Oral)   Resp 16   Ht 5' 8"  (1.727 m)   Wt 240 lb (108.9 kg)   SpO2 99%   BMI 36.49 kg/m   Physical Exam  Constitutional: He is oriented to person, place, and time. He appears well-developed and well-nourished. No distress.  HENT:  Head:  Normocephalic and atraumatic.  Mucous membranes moist  Cardiovascular: Normal rate, regular rhythm and normal heart sounds.   No murmur heard. Pulmonary/Chest: Effort normal and breath sounds normal. No respiratory distress. He has no wheezes.  Abdominal: Soft. Bowel sounds are normal. There is tenderness. There is no rebound.  Right mid abdominal tenderness to palpation without rebound or guarding  Musculoskeletal: He exhibits no edema.  Neurological: He is alert and oriented to person, place, and time.  Skin: Skin is warm and dry.  Psychiatric:  Strange affect, will not make direct eye contact  Nursing note and vitals reviewed.    ED Treatments / Results  Labs (all labs ordered are listed, but only abnormal results are displayed) Labs Reviewed  CBC WITH DIFFERENTIAL/PLATELET - Abnormal; Notable for the following:       Result Value   HCT 38.8 (*)    RDW 15.6 (*)    All other components within normal limits  COMPREHENSIVE METABOLIC PANEL - Abnormal; Notable for the following:    ALT 16 (*)    Anion gap 4 (*)    All other components within normal limits  LIPASE, BLOOD  URINALYSIS, ROUTINE W REFLEX MICROSCOPIC (NOT AT [facility])    EKG  EKG Interpretation None       Radiology Ct Abdomen Pelvis W Contrast  Result Date: 03/18/2016 CLINICAL DATA:  Abdominal pain. Recent diagnosis of C diff. Patient reports diarrhea. EXAM: CT ABDOMEN AND PELVIS WITH CONTRAST TECHNIQUE: Multidetector CT imaging of the abdomen and pelvis was performed using the standard protocol following bolus administration of intravenous contrast. CONTRAST:  145m ISOVUE-300 IOPAMIDOL (ISOVUE-300) INJECTION 61% COMPARISON:  CT 01/27/2016, 11/21/2015, additional priors reviewed FINDINGS: Lower chest: Lung bases are clear. Hepatobiliary: No focal liver abnormality is seen. No gallstones, gallbladder wall thickening, or biliary dilatation. Pancreas: Unremarkable. No pancreatic ductal dilatation or surrounding  inflammatory changes. Spleen: Normal in size without focal abnormality. Adrenals/Urinary Tract: Adrenal glands are unremarkable. Kidneys are normal, without renal calculi, focal lesion, or hydronephrosis. Bladder is unremarkable. Stomach/Bowel: Scattered colonic wall thickening involving the sigmoid colon, splenic flexure, and descending colon. No small bowel dilatation. Stomach is physiologically distended. Appendix is normal. Vascular/Lymphatic: No significant vascular findings are present. No enlarged abdominal or pelvic lymph nodes. Reproductive: Prostate is unremarkable. Other: No abdominal wall hernia or abnormality. No abdominopelvic ascites. Musculoskeletal: There are no acute or suspicious osseous abnormalities. IMPRESSION: Scattered colonic wall thickening most prominent  in the sigmoid colon, consistent with colitis. Electronically Signed   By: Jeb Levering M.D.   On: 03/18/2016 06:09    Procedures Procedures (including critical care time)  Medications Ordered in ED Medications  iopamidol (ISOVUE-300) 61 % injection (not administered)  sodium chloride 0.9 % bolus 1,000 mL (1,000 mLs Intravenous New Bag/Given 03/18/16 0241)  morphine 4 MG/ML injection 4 mg (4 mg Intravenous Given 03/18/16 0249)  ondansetron (ZOFRAN) injection 4 mg (4 mg Intravenous Given 03/18/16 0249)  HYDROmorphone (DILAUDID) injection 1 mg (1 mg Intravenous Given 03/18/16 0421)  iopamidol (ISOVUE-300) 61 % injection 100 mL (100 mLs Intravenous Contrast Given 03/18/16 0546)     Initial Impression / Assessment and Plan / ED Course  I have reviewed the triage vital signs and the nursing notes.  Pertinent labs & imaging results that were available during my care of the patient were reviewed by me and considered in my medical decision making (see chart for details).  Clinical Course    Patient presents with abdominal pain. This in the setting of ongoing treatment for C. difficile colitis. Nontoxic. Initial vital  signs reassuring. He has tenderness without signs of peritonitis. Lab work is largely reassuring. He was initially given morphine and fluids. He reports persistent pain refractory to pain medication. He was given subsequent doses of Dilaudid. Discussed at length with the patient and his mother the utility of imaging at this point. He has had multiple CT scans and is fairly young. He reports persistent pain that he feels is different from prior. Will obtain CT after discussion with the patient and his mother.   CT shows evidence of colitis but no other complication. On recheck, patient improved. He is able to tolerate fluids. Continue course of Flagyl and follow-up with Dr. [name] for recommendations regarding transition to oral vancomycin.  Continue Percocet at home as needed for pain.   After history, exam, and medical workup I feel the patient has been appropriately medically screened and is safe for discharge home. Pertinent diagnoses were discussed with the patient. Patient was given return precautions.  Final Clinical Impressions(s) / ED Diagnoses   Final diagnoses:  Abdominal pain, unspecified abdominal location  C. difficile colitis    New Prescriptions New Prescriptions   No medications on file     Merryl Hacker, MD 03/18/16 [phone]

## 2016-03-18 NOTE — Telephone Encounter (Signed)
I spoke to pt's mom and she said pt went to the ED last night because his abdominal pain got so bad.  He is now having a lot of nausea and does not have any nausea medication. His next appt is with Dr. [name] on 04/09/2016. Please advise!

## 2016-03-18 NOTE — Discharge Instructions (Signed)
Continue Flagyl and Percocet at home for abdominal pain and C. difficile colitis. Follow-up with Dr. [name] if symptoms do not improved. You may need to transition to oral vancomycin.

## 2016-03-18 NOTE — Telephone Encounter (Signed)
[phone]  Please call patients mother, she has some concerns about what the next step is in his care.  He went went to the ER last night and now he is having nausea.

## 2016-03-18 NOTE — Addendum Note (Signed)
Addended by: [name] on: 03/18/2016 03:13 PM   Modules accepted: Orders

## 2016-03-18 NOTE — ED Notes (Signed)
Pt has had no BM, loose or hard, since under the care of this RN. He has rested or watched TV

## 2016-03-18 NOTE — Telephone Encounter (Signed)
Pt's mom is aware and for now she said he can stay on the Flagyl.

## 2016-03-18 NOTE — ED Notes (Signed)
Per mother, pt ha hx of colitis- and has been on flagyl since Saturday for Cdiff. Pt points to his upper abd as where he is having pain. Mother reports that the physician has suggested that he may have to put the  Pt on vancomycin and when she asked the pharmacy, they told her they would not be able to get it if ordered until Weds.

## 2016-03-18 NOTE — Progress Notes (Signed)
PT's mom is aware.

## 2016-03-19 ENCOUNTER — Encounter (HOSPITAL_COMMUNITY): Payer: Self-pay | Admitting: *Deleted

## 2016-03-19 ENCOUNTER — Emergency Department (HOSPITAL_COMMUNITY)
Admission: EM | Admit: 2016-03-19 | Discharge: 2016-03-19 | Disposition: A | Discharge status: Home / Self Care | Payer: Medicaid Other | Attending: Emergency Medicine | Admitting: Emergency Medicine

## 2016-03-19 ENCOUNTER — Ambulatory Visit: Payer: Self-pay | Admitting: Gastroenterology

## 2016-03-19 DIAGNOSIS — Z87891 Personal history of nicotine dependence: Secondary | ICD-10-CM | POA: Insufficient documentation

## 2016-03-19 DIAGNOSIS — K529 Noninfective gastroenteritis and colitis, unspecified: Secondary | ICD-10-CM | POA: Insufficient documentation

## 2016-03-19 DIAGNOSIS — R1084 Generalized abdominal pain: Secondary | ICD-10-CM | POA: Diagnosis present

## 2016-03-19 DIAGNOSIS — Z79899 Other long term (current) drug therapy: Secondary | ICD-10-CM | POA: Insufficient documentation

## 2016-03-19 DIAGNOSIS — J45909 Unspecified asthma, uncomplicated: Secondary | ICD-10-CM | POA: Diagnosis not present

## 2016-03-19 DIAGNOSIS — F909 Attention-deficit hyperactivity disorder, unspecified type: Secondary | ICD-10-CM | POA: Diagnosis not present

## 2016-03-19 LAB — COMPREHENSIVE METABOLIC PANEL
ALT: 14 U/L — ABNORMAL LOW (ref 17–63)
AST: 16 U/L (ref 15–41)
Albumin: 3.7 g/dL (ref 3.5–5.0)
Alkaline Phosphatase: 45 U/L (ref 38–126)
Anion gap: 7 (ref 5–15)
BUN: <5 mg/dL — ABNORMAL LOW (ref 6–20)
CO2: 26 mmol/L (ref 22–32)
Calcium: 9.2 mg/dL (ref 8.9–10.3)
Chloride: 105 mmol/L (ref 101–111)
Creatinine, Ser: 0.94 mg/dL (ref 0.61–1.24)
GFR calc Af Amer: >60 mL/min (ref >60)
GFR calc non Af Amer: >60 mL/min (ref >60)
Glucose, Bld: 96 mg/dL (ref 65–99)
Potassium: 4.2 mmol/L (ref 3.5–5.1)
Sodium: 138 mmol/L (ref 135–145)
Total Bilirubin: 0.2 mg/dL — ABNORMAL LOW (ref 0.3–1.2)
Total Protein: 7 g/dL (ref 6.5–8.1)

## 2016-03-19 LAB — URINE MICROSCOPIC-ADD ON: RBC / HPF: NONE SEEN RBC/hpf (ref 0–5)

## 2016-03-19 LAB — URINALYSIS, ROUTINE W REFLEX MICROSCOPIC
Bilirubin Urine: NEGATIVE
Glucose, UA: NEGATIVE mg/dL
Hgb urine dipstick: NEGATIVE
Ketones, ur: NEGATIVE mg/dL
Nitrite: NEGATIVE
Protein, ur: NEGATIVE mg/dL
Specific Gravity, Urine: 1.025 (ref 1.005–1.030)
pH: 6 (ref 5.0–8.0)

## 2016-03-19 LAB — CBC
HCT: 40.4 % (ref 39.0–52.0)
Hemoglobin: 13.8 g/dL (ref 13.0–17.0)
MCH: 28.2 pg (ref 26.0–34.0)
MCHC: 34.2 g/dL (ref 30.0–36.0)
MCV: 82.6 fL (ref 78.0–100.0)
Platelets: 237 10*3/uL (ref 150–400)
RBC: 4.89 MIL/uL (ref 4.22–5.81)
RDW: 15.6 % — ABNORMAL HIGH (ref 11.5–15.5)
WBC: 7.5 10*3/uL (ref 4.0–10.5)

## 2016-03-19 LAB — LIPASE, BLOOD: Lipase: 19 U/L (ref 11–51)

## 2016-03-19 MED ORDER — HYDROMORPHONE HCL 1 MG/ML IJ SOLN
1.0000 mg | Freq: Once | INTRAMUSCULAR | Status: AC
Start: 2016-03-19 — End: 2016-03-19
  Administered 2016-03-19: 1 mg via INTRAVENOUS
  Filled 2016-03-19: qty 1

## 2016-03-19 MED ORDER — VANCOMYCIN HCL 125 MG PO CAPS: 125.0000 mg | ORAL_CAPSULE | Freq: Four times a day (QID) | ORAL | 0 refills | Status: DC

## 2016-03-19 MED ORDER — ONDANSETRON HCL 4 MG/2ML IJ SOLN
4.0000 mg | Freq: Once | INTRAMUSCULAR | Status: AC
  Administered 2016-03-19: 4 mg via INTRAVENOUS
  Filled 2016-03-19: qty 2

## 2016-03-19 MED ORDER — ONDANSETRON HCL 4 MG/2ML IJ SOLN
4.0000 mg | Freq: Once | INTRAMUSCULAR | Status: DC
  Filled 2016-03-19: qty 2

## 2016-03-19 MED ORDER — HYDROMORPHONE HCL 4 MG PO TABS: 4.0000 mg | ORAL_TABLET | Freq: Four times a day (QID) | ORAL | 0 refills | Status: DC | PRN

## 2016-03-19 MED ORDER — ONDANSETRON HCL 4 MG/2ML IJ SOLN
4.0000 mg | Freq: Once | INTRAMUSCULAR | Status: AC
  Administered 2016-03-19: 4 mg via INTRAVENOUS

## 2016-03-19 MED ORDER — SODIUM CHLORIDE 0.9 % IV BOLUS (SEPSIS)
1000.0000 mL | Freq: Once | INTRAVENOUS | Status: AC
  Administered 2016-03-19: 1000 mL via INTRAVENOUS

## 2016-03-19 MED ORDER — HYDROMORPHONE HCL 1 MG/ML IJ SOLN
1.0000 mg | Freq: Once | INTRAMUSCULAR | Status: AC
Start: 2016-03-19 — End: 2016-03-19
  Administered 2016-03-19: 1 mg via INTRAVENOUS

## 2016-03-19 MED ORDER — HYDROMORPHONE HCL 1 MG/ML IJ SOLN
1.0000 mg | Freq: Once | INTRAMUSCULAR | Status: DC
Start: 2016-03-19 — End: 2016-03-19
  Filled 2016-03-19: qty 1

## 2016-03-19 NOTE — ED Notes (Signed)
Patient will notify staff when he can provide urine sample.

## 2016-03-19 NOTE — ED Triage Notes (Signed)
History of c diff,c/o abdominal pain

## 2016-03-19 NOTE — ED Provider Notes (Signed)
[place] DEPT Provider Note   CSN: [id number] Arrival date & time: 03/19/16  1332     History   Chief Complaint Chief Complaint  Patient presents with  . Abdominal Pain    HPI MABEL UNREIN is a 25 y.o. male.  Patient returns today for abdominal pain and rectal bleeding. Patient has a history of colitis C. difficile colitis. He was placed on Flagyl Saturday but does not seem to be improving   The history is provided by the patient. No language interpreter was used.  Abdominal Pain   This is a recurrent problem. The problem occurs constantly. The problem has not changed since onset.The pain is associated with an unknown factor. The pain is located in the generalized abdominal region. The quality of the pain is aching. The pain is at a severity of 6/10. The pain is moderate. Associated symptoms include diarrhea. Pertinent negatives include anorexia, frequency, hematuria and headaches. Nothing aggravates the symptoms.    Past Medical History:  Diagnosis Date  . Abdominal pain, chronic, right upper quadrant   . ADHD (attention deficit hyperactivity disorder)   . Anxiety   . Asthma   . Bipolar 1 disorder ([place])   . Colitis   . Depression   . GERD (gastroesophageal reflux disease)   . Hearing loss   . HOH (hard of hearing)   . Mental developmental delay 11/15/2012  . PONV (postoperative nausea and vomiting)   . Seizures [facility])     Patient Active Problem List   Diagnosis Date Noted  . Diarrhea 03/07/2016  . Ulcerative colitis ([place]) 12/29/2015  . CAP (community acquired pneumonia)   . 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 Aspiration pneumonitis ([place]) 11/21/2015  . Chronic blood loss anemia 11/21/2015  . Bipolar I disorder, most recent episode depressed ([place])   . Bipolar 1 disorder, manic, moderate ([place]) 11/09/2014  . Suicidal ideation   . Major depressive disorder, recurrent episode, moderate ([place])   . Mental developmental delay 11/15/2012    Past Surgical History:  Procedure  Laterality Date  . ADENOIDECTOMY    . BIOPSY N/A 12/07/2012   Procedure: GASTRIC BIOPSIES;  Surgeon: [name], MD;  Location: [facility] ORS;  Service: Endoscopy;  Laterality: N/A;  . BIOPSY N/A 08/30/2013   Procedure: BIOPSY;  Surgeon: [name], MD;  Location: [facility] ORS;  Service: Endoscopy;  Laterality: N/A;  right colon,transverse colon, left colon, rectal biopsies  . BIOPSY  11/20/2015   Procedure: BIOPSY;  Surgeon: [name], MD;  Location: [facility] ENDO SUITE;  Service: Endoscopy;;  ileal, right colon biopsy, left colon, rectum  . COLONOSCOPY  11/20/2015   Dr. [name]: Severe erythema, edema, ulcers from the anal verge to 20 cm above the anal verge without mucosal sparing, remaining colon and terminal ileum appeared normal. Biopsies from the rectum revealed fulminant active chronic colitis consistent with IBD. Pathology from terminal ileum revealed intramucosal lymphoid aggregates. Remaining colon random biopsies with inactive chronic colitis  . COLONOSCOPY WITH PROPOFOL N/A 08/30/2013   HWE:XHBZJI mucosa in the terminal ileum/COLITIS/ MILD PROCTITIS. 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  Marland Kitchen COLONOSCOPY WITH PROPOFOL N/A 11/20/2015   Dr. 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 ESOPHAGOGASTRODUODENOSCOPY  11/20/2015   Dr. [name]: Normal exam, stomach biopsied and duodenal biopsy.. Benign biopsies.  . ESOPHAGOGASTRODUODENOSCOPY (EGD) WITH PROPOFOL N/A 12/07/2012   SLF:The mucosa of the esophagus appeared normal Non-erosive  gastritis (inflammation) was found in the gastric antrum; multiple biopsies The duodenal mucosa showed no abnormalities in the bulb and second portion of the duodenum  . ESOPHAGOGASTRODUODENOSCOPY (EGD) WITH PROPOFOL N/A 11/20/2015   Dr. [name]: normal with normal biopsies, negative H.pylori   . TONSILLECTOMY         Home  Medications    Prior to Admission medications   Medication Sig Start Date End Date Taking? Authorizing Provider  albuterol (PROVENTIL HFA;VENTOLIN HFA) 108 (90 Base) MCG/ACT inhaler Inhale 2 puffs into the lungs every 4 (four) hours as needed for wheezing or shortness of breath (and/or cough).   Yes Historical Provider, MD  albuterol (PROVENTIL) (2.5 MG/3ML) 0.083% nebulizer solution Inhale 2.5 mg into the lungs every 6 (six) hours as needed for wheezing or shortness of breath.    Yes Historical Provider, MD  atomoxetine (STRATTERA) 100 MG capsule Take 100 mg by mouth daily. Pt takes with a 61m capsule.   Yes Historical Provider, MD  atomoxetine (STRATTERA) 25 MG capsule Take 25 mg by mouth daily. Pt takes with a 1024mcapsule.   Yes Historical Provider, MD  Budesonide (UCERIS) 2 MG/ACT FOAM Place 2 mg rectally 2 (two) times daily. 03/07/16 03/21/16 Yes ErCarlis StableNP  budesonide-formoterol (SYMBICORT) 160-4.5 MCG/ACT inhaler Inhale 2 puffs into the lungs 2 (two) times daily.   Yes Historical Provider, MD  divalproex (DEPAKOTE) 500 MG DR tablet Take 1 tablet (500 mg total) by mouth 2 (two) times daily. 07/03/15  Yes ViPenni BombardMD  EPINEPHrine (EPIPEN 2-PAK) 0.3 mg/0.3 mL IJ SOAJ injection Inject 0.3 mg into the muscle once as needed (for severe allergic reaction).   Yes Historical Provider, MD  fluticasone (FLONASE) 50 MCG/ACT nasal spray Place 1-2 sprays into both nostrils daily as needed for rhinitis.   Yes Historical Provider, MD  HYDROcodone-acetaminophen (NORCO/VICODIN) 5-325 MG tablet 1 or 2 tabs PO q6 hours prn pain Patient taking differently: Take 1-2 tablets by mouth every 6 (six) hours as needed for moderate pain.  03/01/16  Yes KaFrancine GravenDO  loratadine (CLARITIN) 10 MG tablet Take 10 mg by mouth at bedtime.    Yes Historical Provider, MD  metroNIDAZOLE (FLAGYL) 500 MG tablet Take 500 mg by mouth 3 (three) times daily. 14 day course starting on 03/15/2016 03/15/16  Yes  Historical Provider, MD  montelukast (SINGULAIR) 10 MG tablet Take 1 tablet (10 mg total) by mouth at bedtime. 11/13/14  Yes LaNiel HummerNP  oxyCODONE-acetaminophen (PERCOCET/ROXICET) 5-325 MG tablet Take 1 tablet by mouth every 6 (six) hours as needed for severe pain.   Yes Historical Provider, MD  promethazine (PHENERGAN) 12.5 MG tablet 1 PO BID. MAY USE ADDITIONAL DOSE EVERY 4 HOURS. NO MORE THAN 8 DOSES A DAY. Patient taking differently: Take 12.5 mg by mouth every 4 (four) hours as needed for nausea or vomiting. MAY USE ADDITIONAL DOSE EVERY 4 HOURS. NO MORE THAN 8 DOSES A DAY. 03/18/16  Yes SaDanie BinderMD  ranitidine (ZANTAC) 150 MG tablet Take 1 tablet (150 mg total) by mouth daily. 11/13/14  Yes LaNiel HummerNP  sertraline (ZOLOFT) 100 MG tablet Take 1 tablet (100 mg total) by mouth daily. 11/13/14  Yes LaNiel HummerNP  traMADol (ULTRAM) 50 MG tablet Take 100 mg by mouth every 6 (six) hours as needed for moderate pain (headache pain).    Yes Historical Provider, MD  traZODone (DESYREL) 100 MG tablet Take 100 mg by mouth at bedtime.  Yes Historical Provider, MD  triamcinolone cream (KENALOG) 0.1 % Apply 1 application topically 2 (two) times daily as needed (for itching).   Yes Historical Provider, MD  HYDROmorphone (DILAUDID) 4 MG tablet Take 1 tablet (4 mg total) by mouth every 6 (six) hours as needed for severe pain. 03/19/16   Milton Ferguson, MD  ondansetron (ZOFRAN ODT) 8 MG disintegrating tablet Take 1 tablet (8 mg total) by mouth every 8 (eight) hours as needed for nausea or vomiting. Patient not taking: Reported on 03/19/2016 12/25/15   Jola Schmidt, MD  vancomycin (VANCOCIN) 125 MG capsule Take 1 capsule (125 mg total) by mouth 4 (four) times daily. 03/19/16   Milton Ferguson, MD    Family History Family History  Problem Relation Age of Onset  . Asthma Mother   . Ulcers Mother   . Bipolar disorder Mother   . ADD / ADHD Father   . Diabetes Maternal Grandmother   . Diabetes  Maternal Grandfather   . Colon cancer Neg Hx   . Liver disease Neg Hx     Social History Social History  Substance Use Topics  . Smoking status: Former Smoker    Packs/day: 0.00    Years: 2.00    Quit date: 10/06/2012  . Smokeless tobacco: Never Used     Comment: Never smoked cigarettes  . Alcohol use No     Comment: denies usage     Allergies   Amoxicillin-pot clavulanate; Ibuprofen; Omeprazole; Pineapple; Strawberry extract; and Tomato   Review of Systems Review of Systems  Constitutional: Negative for appetite change and fatigue.  HENT: Negative for congestion, ear discharge and sinus pressure.   Eyes: Negative for discharge.  Respiratory: Negative for cough.   Cardiovascular: Negative for chest pain.  Gastrointestinal: Positive for abdominal pain and diarrhea. Negative for anorexia.  Genitourinary: Negative for frequency and hematuria.  Musculoskeletal: Negative for back pain.  Skin: Negative for rash.  Neurological: Negative for seizures and headaches.  Psychiatric/Behavioral: Negative for hallucinations.     Physical Exam Updated Vital Signs BP 116/78   Pulse 107   Temp 98.1 F (36.7 C) (Oral)   Resp 18   Wt 237 lb (107.5 kg)   SpO2 97%   BMI 36.04 kg/m   Physical Exam  Constitutional: He is oriented to person, place, and time. He appears well-developed.  HENT:  Head: Normocephalic.  Eyes: Conjunctivae and EOM are normal. No scleral icterus.  Neck: Neck supple. No thyromegaly present.  Cardiovascular: Normal rate and regular rhythm.  Exam reveals no gallop and no friction rub.   No murmur heard. Pulmonary/Chest: No stridor. He has no wheezes. He has no rales. He exhibits no tenderness.  Abdominal: He exhibits no distension. There is tenderness. There is no rebound.  Minor tenderness throughout  Musculoskeletal: Normal range of motion. He exhibits no edema.  Lymphadenopathy:    He has no cervical adenopathy.  Neurological: He is oriented to person,  place, and time. He exhibits normal muscle tone. Coordination normal.  Skin: No rash noted. No erythema.  Psychiatric: He has a normal mood and affect. His behavior is normal.     ED Treatments / Results  Labs (all labs ordered are listed, but only abnormal results are displayed) Labs Reviewed  COMPREHENSIVE METABOLIC PANEL - Abnormal; Notable for the following:       Result Value   BUN <5 (*)    ALT 14 (*)    Total Bilirubin 0.2 (*)    All other components  within normal limits  CBC - Abnormal; Notable for the following:    RDW 15.6 (*)    All other components within normal limits  LIPASE, BLOOD  URINALYSIS, ROUTINE W REFLEX MICROSCOPIC (NOT AT [facility])    EKG  EKG Interpretation None       Radiology Ct Abdomen Pelvis W Contrast  Result Date: 03/18/2016 CLINICAL DATA:  Abdominal pain. Recent diagnosis of C diff. Patient reports diarrhea. EXAM: CT ABDOMEN AND PELVIS WITH CONTRAST TECHNIQUE: Multidetector CT imaging of the abdomen and pelvis was performed using the standard protocol following bolus administration of intravenous contrast. CONTRAST:  178m ISOVUE-300 IOPAMIDOL (ISOVUE-300) INJECTION 61% COMPARISON:  CT 01/27/2016, 11/21/2015, additional priors reviewed FINDINGS: Lower chest: Lung bases are clear. Hepatobiliary: No focal liver abnormality is seen. No gallstones, gallbladder wall thickening, or biliary dilatation. Pancreas: Unremarkable. No pancreatic ductal dilatation or surrounding inflammatory changes. Spleen: Normal in size without focal abnormality. Adrenals/Urinary Tract: Adrenal glands are unremarkable. Kidneys are normal, without renal calculi, focal lesion, or hydronephrosis. Bladder is unremarkable. Stomach/Bowel: Scattered colonic wall thickening involving the sigmoid colon, splenic flexure, and descending colon. No small bowel dilatation. Stomach is physiologically distended. Appendix is normal. Vascular/Lymphatic: No significant vascular findings are present. No  enlarged abdominal or pelvic lymph nodes. Reproductive: Prostate is unremarkable. Other: No abdominal wall hernia or abnormality. No abdominopelvic ascites. Musculoskeletal: There are no acute or suspicious osseous abnormalities. IMPRESSION: Scattered colonic wall thickening most prominent in the sigmoid colon, consistent with colitis. Electronically Signed   By: MJeb LeveringM.D.   On: 03/18/2016 06:09    Procedures Procedures (including critical care time)  Medications Ordered in ED Medications  HYDROmorphone (DILAUDID) injection 1 mg (not administered)  ondansetron (ZOFRAN) injection 4 mg (not administered)  HYDROmorphone (DILAUDID) injection 1 mg (not administered)  ondansetron (ZOFRAN) injection 4 mg (not administered)  HYDROmorphone (DILAUDID) injection 1 mg (1 mg Intravenous Given 03/19/16 1553)  ondansetron (ZOFRAN) injection 4 mg (4 mg Intravenous Given 03/19/16 1553)  sodium chloride 0.9 % bolus 1,000 mL (0 mLs Intravenous Stopped 03/19/16 1728)     Initial Impression / Assessment and Plan / ED Course  I have reviewed the triage vital signs and the nursing notes.  Pertinent labs & imaging results that were available during my care of the patient were reviewed by me and considered in my medical decision making (see chart for details).  Clinical Course   Patient with colitis. Most likely C. difficile. I spoke with the GI doctor,  Dr. rGala Romneyand he suggested stopping the Flagyl and starting vancomycin. Patient was instructed to follow up with GI next week  Final Clinical Impressions(s) / ED Diagnoses   Final diagnoses:  Colitis    New Prescriptions New Prescriptions   HYDROMORPHONE (DILAUDID) 4 MG TABLET    Take 1 tablet (4 mg total) by mouth every 6 (six) hours as needed for severe pain.   VANCOMYCIN (VANCOCIN) 125 MG CAPSULE    Take 1 capsule (125 mg total) by mouth 4 (four) times daily.     JMilton Ferguson MD 03/19/16 1745

## 2016-03-19 NOTE — Discharge Instructions (Signed)
Follow-up with your stomach specialist next week for recheck.

## 2016-03-20 ENCOUNTER — Telehealth: Payer: Self-pay | Admitting: Gastroenterology

## 2016-03-20 NOTE — Telephone Encounter (Addendum)
CALLED MOM TO DISCUSS. LVM TO CALL [phone]. OK TO START VANCOMYCIN. RX SENT ELECTRONICALLY OCT 11. PT SHOULD PICK IT UP. OPV NEXT WED  @1130 .  REVIEW C DIFF PRECAUTIONS. WITH MOTHER.  Hand-Washing Hand-washing is the No. 1 preventative measure in stopping the spread and infection of C. Diff. It is important to wash hands with warm water and anti-bacterial soap for at least 30 seconds. Alcohol-based hand sanitizers are not effective against C. Diff and will not kill spores that may be on the hands.  Isolation Any person who is infected with C. Diff should remain in a separate bedroom and ideally have a separate bathroom for at least 48 hours after they have stopped having diarrhea during treatment. When you are around a person with this infection, it is important to wear gloves and, if possible, a yellow isolation gown to prevent picking up the spores from their environment. Wash your hands immediately upon leaving their room to kill any spores that you may have accidentally picked up.  Separate Hygiene Items The person infected with C. Diff should have their own hygiene items. Do not share washcloths, hand towels, toothbrushes, or linens with an infected person. Linens and clothes should be washed separately from anyone else's clothes with detergent and on the hot setting of the washer and dryer.  Daily Cleaning of Affected Areas It is important to clean any areas that the infected person comes into contact with daily. Bathrooms should be cleaned after every visit. Mix 1 part of bleach with 10 parts of water as a cleaner and use on toilet bowls, sinks, sink and toilet handles and doorknobs. Vacuum carpets daily and mop any hard floors with the bleach solution used for bathroom cleansing to kill spores that are in the environment

## 2016-03-20 NOTE — Telephone Encounter (Signed)
cdiff info given to Mom.

## 2016-03-20 NOTE — Telephone Encounter (Signed)
I called and spoke with Ms. Redmond Pulling, told her I would find out what happened and if it needed a PA, I would get it done for him. I spoke with [facility], even though vancocin is on list of preferred medications, it still needs a PA.  PA is done and approved. They do not have rx because they gave it back to the mother.   Dr.Fields, do you want him on vancocin? Do you want to send in new rx or have them take ED rx back to the pharmacy?

## 2016-03-20 NOTE — Telephone Encounter (Signed)
OV made °

## 2016-03-20 NOTE — Telephone Encounter (Signed)
Mom called spoke to her.EXPLAINED THE PLAN. I PERSONALLY REVIEWED C DIFF PRECAUTIONS. SHE VOICED HER UNDERSTANDING. THERE ARE 3 OTHER PEOPLE LIVING IN THEIR HOME WITH ONE BATHROOM.  SHE WILL PICK UP C DIFF PRECAUTIONS.

## 2016-03-20 NOTE — Telephone Encounter (Signed)
Patient's mother called this morning to say that patient was in the ED last night and RMR told them that Lennette Bihari needed to see SF next week. SF is only here Wednesday next week. Can I use her [facility]?  Also, mother said that we needed to do a PA on his vancomycin because the ER doctor couldn't do it. I told patient's mother that I would have to get with SF first to see about scheduling and I would let the nurse be aware of the PA. Please advise and call Mrs Redmond Pulling at [phone]

## 2016-03-22 ENCOUNTER — Inpatient Hospital Stay (HOSPITAL_COMMUNITY)
Admission: EM | Admit: 2016-03-22 | Discharge: 2016-03-31 | DRG: 372 | Disposition: A | Discharge status: Home / Self Care | Payer: Medicaid Other | Attending: Internal Medicine | Admitting: Internal Medicine

## 2016-03-22 ENCOUNTER — Encounter (HOSPITAL_COMMUNITY): Payer: Self-pay | Admitting: *Deleted

## 2016-03-22 DIAGNOSIS — K5190 Ulcerative colitis, unspecified, without complications: Secondary | ICD-10-CM | POA: Diagnosis present

## 2016-03-22 DIAGNOSIS — F819 Developmental disorder of scholastic skills, unspecified: Secondary | ICD-10-CM | POA: Diagnosis present

## 2016-03-22 DIAGNOSIS — Z818 Family history of other mental and behavioral disorders: Secondary | ICD-10-CM

## 2016-03-22 DIAGNOSIS — E861 Hypovolemia: Secondary | ICD-10-CM | POA: Diagnosis present

## 2016-03-22 DIAGNOSIS — F319 Bipolar disorder, unspecified: Secondary | ICD-10-CM | POA: Diagnosis not present

## 2016-03-22 DIAGNOSIS — F909 Attention-deficit hyperactivity disorder, unspecified type: Secondary | ICD-10-CM | POA: Diagnosis present

## 2016-03-22 DIAGNOSIS — K529 Noninfective gastroenteritis and colitis, unspecified: Secondary | ICD-10-CM | POA: Diagnosis not present

## 2016-03-22 DIAGNOSIS — F79 Unspecified intellectual disabilities: Secondary | ICD-10-CM | POA: Diagnosis present

## 2016-03-22 DIAGNOSIS — K922 Gastrointestinal hemorrhage, unspecified: Secondary | ICD-10-CM | POA: Diagnosis not present

## 2016-03-22 DIAGNOSIS — K5100 Ulcerative (chronic) pancolitis without complications: Secondary | ICD-10-CM | POA: Diagnosis not present

## 2016-03-22 DIAGNOSIS — R946 Abnormal results of thyroid function studies: Secondary | ICD-10-CM | POA: Diagnosis not present

## 2016-03-22 DIAGNOSIS — R000 Tachycardia, unspecified: Secondary | ICD-10-CM | POA: Diagnosis present

## 2016-03-22 DIAGNOSIS — B370 Candidal stomatitis: Secondary | ICD-10-CM | POA: Diagnosis not present

## 2016-03-22 DIAGNOSIS — A0471 Enterocolitis due to Clostridium difficile, recurrent: Secondary | ICD-10-CM | POA: Diagnosis present

## 2016-03-22 DIAGNOSIS — A0472 Enterocolitis due to Clostridium difficile, not specified as recurrent: Secondary | ICD-10-CM

## 2016-03-22 DIAGNOSIS — Z79899 Other long term (current) drug therapy: Secondary | ICD-10-CM | POA: Diagnosis not present

## 2016-03-22 DIAGNOSIS — H9190 Unspecified hearing loss, unspecified ear: Secondary | ICD-10-CM | POA: Diagnosis present

## 2016-03-22 DIAGNOSIS — Z825 Family history of asthma and other chronic lower respiratory diseases: Secondary | ICD-10-CM

## 2016-03-22 DIAGNOSIS — K51919 Ulcerative colitis, unspecified with unspecified complications: Secondary | ICD-10-CM | POA: Diagnosis present

## 2016-03-22 DIAGNOSIS — R109 Unspecified abdominal pain: Secondary | ICD-10-CM | POA: Diagnosis not present

## 2016-03-22 DIAGNOSIS — Z87891 Personal history of nicotine dependence: Secondary | ICD-10-CM | POA: Diagnosis not present

## 2016-03-22 DIAGNOSIS — D62 Acute posthemorrhagic anemia: Secondary | ICD-10-CM | POA: Diagnosis not present

## 2016-03-22 DIAGNOSIS — K51818 Other ulcerative colitis with other complication: Secondary | ICD-10-CM | POA: Diagnosis not present

## 2016-03-22 DIAGNOSIS — Z833 Family history of diabetes mellitus: Secondary | ICD-10-CM

## 2016-03-22 HISTORY — DX: Enterocolitis due to Clostridium difficile, not specified as recurrent: A04.72

## 2016-03-22 LAB — COMPREHENSIVE METABOLIC PANEL
ALT: 29 U/L (ref 17–63)
AST: 38 U/L (ref 15–41)
Albumin: 3.3 g/dL — ABNORMAL LOW (ref 3.5–5.0)
Alkaline Phosphatase: 74 U/L (ref 38–126)
Anion gap: 7 (ref 5–15)
BUN: <5 mg/dL — ABNORMAL LOW (ref 6–20)
CO2: 24 mmol/L (ref 22–32)
Calcium: 8.9 mg/dL (ref 8.9–10.3)
Chloride: 104 mmol/L (ref 101–111)
Creatinine, Ser: 0.91 mg/dL (ref 0.61–1.24)
GFR calc Af Amer: >60 mL/min (ref >60)
GFR calc non Af Amer: >60 mL/min (ref >60)
Glucose, Bld: 108 mg/dL — ABNORMAL HIGH (ref 65–99)
Potassium: 3.7 mmol/L (ref 3.5–5.1)
Sodium: 135 mmol/L (ref 135–145)
Total Bilirubin: 0.4 mg/dL (ref 0.3–1.2)
Total Protein: 6.8 g/dL (ref 6.5–8.1)

## 2016-03-22 LAB — CBC WITH DIFFERENTIAL/PLATELET
Basophils Absolute: 0 10*3/uL (ref 0.0–0.1)
Basophils Relative: 0 %
Eosinophils Absolute: 0.2 10*3/uL (ref 0.0–0.7)
Eosinophils Relative: 3 %
HCT: 38.6 % — ABNORMAL LOW (ref 39.0–52.0)
Hemoglobin: 13.4 g/dL (ref 13.0–17.0)
Lymphocytes Relative: 26 %
Lymphs Abs: 1.8 10*3/uL (ref 0.7–4.0)
MCH: 28.5 pg (ref 26.0–34.0)
MCHC: 34.7 g/dL (ref 30.0–36.0)
MCV: 82.1 fL (ref 78.0–100.0)
Monocytes Absolute: 1.2 10*3/uL — ABNORMAL HIGH (ref 0.1–1.0)
Monocytes Relative: 17 %
Neutro Abs: 3.7 10*3/uL (ref 1.7–7.7)
Neutrophils Relative %: 54 %
Platelets: 241 10*3/uL (ref 150–400)
RBC: 4.7 MIL/uL (ref 4.22–5.81)
RDW: 15.3 % (ref 11.5–15.5)
WBC: 6.9 10*3/uL (ref 4.0–10.5)

## 2016-03-22 LAB — GLUCOSE, CAPILLARY: Glucose-Capillary: 109 mg/dL — ABNORMAL HIGH (ref 65–99)

## 2016-03-22 LAB — LIPASE, BLOOD: Lipase: 16 U/L (ref 11–51)

## 2016-03-22 MED ORDER — VANCOMYCIN 50 MG/ML ORAL SOLUTION
250.0000 mg | Freq: Four times a day (QID) | ORAL | Status: DC
  Administered 2016-03-22 – 2016-03-31 (×36): 250 mg via ORAL
  Filled 2016-03-22 (×45): qty 5

## 2016-03-22 MED ORDER — SERTRALINE HCL 50 MG PO TABS
100.0000 mg | ORAL_TABLET | Freq: Every day | ORAL | Status: DC
  Administered 2016-03-23 – 2016-03-31 (×9): 100 mg via ORAL
  Filled 2016-03-22 (×10): qty 2

## 2016-03-22 MED ORDER — MONTELUKAST SODIUM 10 MG PO TABS
10.0000 mg | ORAL_TABLET | Freq: Every day | ORAL | Status: DC
  Administered 2016-03-22 – 2016-03-30 (×9): 10 mg via ORAL
  Filled 2016-03-22 (×10): qty 1

## 2016-03-22 MED ORDER — TRAZODONE HCL 50 MG PO TABS
100.0000 mg | ORAL_TABLET | Freq: Every day | ORAL | Status: DC
  Administered 2016-03-22 – 2016-03-30 (×9): 100 mg via ORAL
  Filled 2016-03-22 (×8): qty 2

## 2016-03-22 MED ORDER — POTASSIUM CHLORIDE IN NACL 20-0.9 MEQ/L-% IV SOLN
INTRAVENOUS | Status: DC
  Administered 2016-03-22 – 2016-03-27 (×11): via INTRAVENOUS

## 2016-03-22 MED ORDER — ONDANSETRON HCL 4 MG PO TABS: 4.0000 mg | ORAL_TABLET | Freq: Four times a day (QID) | ORAL | Status: DC | PRN

## 2016-03-22 MED ORDER — FAMOTIDINE 20 MG PO TABS
20.0000 mg | ORAL_TABLET | Freq: Every day | ORAL | Status: DC
  Administered 2016-03-23: 20 mg via ORAL
  Filled 2016-03-22: qty 1

## 2016-03-22 MED ORDER — ACETAMINOPHEN 325 MG PO TABS
650.0000 mg | ORAL_TABLET | Freq: Four times a day (QID) | ORAL | Status: DC | PRN
  Administered 2016-03-26: 650 mg via ORAL
  Filled 2016-03-22: qty 2

## 2016-03-22 MED ORDER — DIVALPROEX SODIUM 250 MG PO DR TAB
500.0000 mg | DELAYED_RELEASE_TABLET | Freq: Two times a day (BID) | ORAL | Status: DC
  Administered 2016-03-22 – 2016-03-31 (×18): 500 mg via ORAL
  Filled 2016-03-22 (×19): qty 2

## 2016-03-22 MED ORDER — FLUCONAZOLE 100 MG PO TABS
100.0000 mg | ORAL_TABLET | Freq: Every day | ORAL | Status: DC
Start: 2016-03-22 — End: 2016-03-25
  Administered 2016-03-23 – 2016-03-25 (×3): 100 mg via ORAL
  Filled 2016-03-22 (×3): qty 1

## 2016-03-22 MED ORDER — SODIUM CHLORIDE 0.9 % IV BOLUS (SEPSIS)
1000.0000 mL | Freq: Once | INTRAVENOUS | Status: AC
  Administered 2016-03-22: 1000 mL via INTRAVENOUS

## 2016-03-22 MED ORDER — ONDANSETRON HCL 4 MG/2ML IJ SOLN
4.0000 mg | Freq: Four times a day (QID) | INTRAMUSCULAR | Status: DC | PRN
  Administered 2016-03-22 – 2016-03-28 (×9): 4 mg via INTRAVENOUS
  Filled 2016-03-22 (×9): qty 2

## 2016-03-22 MED ORDER — BOOST / RESOURCE BREEZE PO LIQD
1.0000 | Freq: Three times a day (TID) | ORAL | Status: DC
  Administered 2016-03-22: 1 via ORAL
  Administered 2016-03-22: 20:00:00 via ORAL
  Administered 2016-03-23 (×2): 1 via ORAL
  Administered 2016-03-23 – 2016-03-24 (×2): via ORAL
  Administered 2016-03-25 – 2016-03-28 (×2): 1 via ORAL
  Administered 2016-03-28: 20:00:00 via ORAL
  Administered 2016-03-28 – 2016-03-30 (×7): 1 via ORAL

## 2016-03-22 MED ORDER — ATOMOXETINE HCL 25 MG PO CAPS: 25.0000 mg | ORAL_CAPSULE | Freq: Every day | ORAL | Status: DC

## 2016-03-22 MED ORDER — ACETAMINOPHEN 650 MG RE SUPP: 650.0000 mg | Freq: Four times a day (QID) | RECTAL | Status: DC | PRN

## 2016-03-22 MED ORDER — HYDROMORPHONE HCL 1 MG/ML IJ SOLN
1.0000 mg | INTRAMUSCULAR | Status: DC | PRN
Start: 2016-03-22 — End: 2016-03-24
  Administered 2016-03-22 – 2016-03-24 (×10): 1 mg via INTRAVENOUS
  Filled 2016-03-22 (×10): qty 1

## 2016-03-22 MED ORDER — ALBUTEROL SULFATE (2.5 MG/3ML) 0.083% IN NEBU
2.5000 mg | INHALATION_SOLUTION | Freq: Four times a day (QID) | RESPIRATORY_TRACT | Status: DC | PRN
Start: 2016-03-22 — End: 2016-03-31

## 2016-03-22 MED ORDER — FLUTICASONE PROPIONATE 50 MCG/ACT NA SUSP: 1.0000 | Freq: Every day | NASAL | Status: DC | PRN

## 2016-03-22 MED ORDER — ONDANSETRON HCL 4 MG/2ML IJ SOLN
4.0000 mg | Freq: Once | INTRAMUSCULAR | Status: AC
  Administered 2016-03-22: 4 mg via INTRAVENOUS
  Filled 2016-03-22: qty 2

## 2016-03-22 MED ORDER — EPINEPHRINE 0.3 MG/0.3ML IJ SOAJ: 0.3000 mg | Freq: Once | INTRAMUSCULAR | Status: DC | PRN

## 2016-03-22 MED ORDER — ATOMOXETINE HCL 40 MG PO CAPS
120.0000 mg | ORAL_CAPSULE | Freq: Every day | ORAL | Status: DC
  Administered 2016-03-23 – 2016-03-31 (×9): 120 mg via ORAL
  Filled 2016-03-22 (×13): qty 3

## 2016-03-22 MED ORDER — FLUTICASONE FUROATE-VILANTEROL 200-25 MCG/INH IN AEPB
1.0000 | INHALATION_SPRAY | Freq: Every day | RESPIRATORY_TRACT | Status: DC
Start: 2016-03-22 — End: 2016-03-31
  Administered 2016-03-23 – 2016-03-31 (×9): 1 via RESPIRATORY_TRACT
  Filled 2016-03-22: qty 28

## 2016-03-22 MED ORDER — VANCOMYCIN HCL IN DEXTROSE 1-5 GM/200ML-% IV SOLN
1000.0000 mg | Freq: Once | INTRAVENOUS | Status: AC
  Administered 2016-03-22: 1000 mg via INTRAVENOUS
  Filled 2016-03-22: qty 200

## 2016-03-22 MED ORDER — LORATADINE 10 MG PO TABS
10.0000 mg | ORAL_TABLET | Freq: Every day | ORAL | Status: DC
  Administered 2016-03-22 – 2016-03-30 (×9): 10 mg via ORAL
  Filled 2016-03-22 (×9): qty 1

## 2016-03-22 MED ORDER — HYDROMORPHONE HCL 1 MG/ML IJ SOLN
1.0000 mg | Freq: Once | INTRAMUSCULAR | Status: AC
Start: 2016-03-22 — End: 2016-03-22
  Administered 2016-03-22: 1 mg via INTRAVENOUS
  Filled 2016-03-22: qty 1

## 2016-03-22 NOTE — ED Notes (Signed)
Pt oxygen decreased to 88% after being given Dilaudid. Pt placed on 2L with encouragement to take deep breaths. Pt oxygen increased to 98%

## 2016-03-22 NOTE — H&P (Signed)
History and Physical    Wayne Avila WHQ:[id number] DOB: 08-Jan-1991 DOA: 03/22/2016  PCP: [name], MD  Gastroenterologist: Dr. [name]  Patient coming from: Home  Chief Complaint: Abdominal pain and bloody diarrhea.  HPI: Wayne Avila is a 25 y.o. male with medical history significant for ulcerative colitis-on Remicade, bipolar disorder, ADHD, and mental developmental delay. His history is also significant for a recent diagnosis of C. difficile colitis in August 2017 which was treated with metronidazole. He developed abdominal cramping and bloody diarrhea a few days ago. He presented to the ED on 03/18/16. CT scan in C. difficile PCR were ordered at that time. The CT scan revealed scattered colonic wall thickening most prominent in the sigmoid colon, consistent with colitis. C. difficile toxin was positive by PCR. The ED physician prescribed vancomycin. He was discharged home. He has taken vancomycin for the past 2 days with no improvement in his symptoms. He presents with diffuse crampy abdominal pain and watery/bloody diarrhea. He developed a fever of 101.27F last night. He has no other complaints except nausea. He denies headache, vomiting, chest pain, shortness of breath, pain with urination, or swelling in his legs. No recent suicidal ideation.  ED Course: In the ED, he was afebrile and tachycardic with a heart rate in the 120s to the 130s. His blood pressure was within normal limits. His lab data are virtually unremarkable with a normal CBC/hemoglobin, normal LFTs, normal lipase, and no significant electrolyte abnormalities. He is being admitted for further evaluation and management of recurrent C. difficile colitis and superimposed ulcerative colitis.  Review of Systems: As per HPI otherwise 10 point review of systems negative.    Past Medical History:  Diagnosis Date  . Abdominal pain, chronic, right upper quadrant   . ADHD (attention deficit hyperactivity disorder)   .  Anxiety   . Asthma   . Bipolar 1 disorder ([place])   . Colitis   . Depression   . GERD (gastroesophageal reflux disease)   . Hearing loss   . HOH (hard of hearing)   . Mental developmental delay 11/15/2012  . PONV (postoperative nausea and vomiting)   . Seizures ([place])     Past Surgical History:  Procedure Laterality Date  . ADENOIDECTOMY    . BIOPSY N/A 12/07/2012   Procedure: GASTRIC BIOPSIES;  Surgeon: [name], MD;  Location: [facility];  Service: Endoscopy;  Laterality: N/A;  . BIOPSY N/A 08/30/2013   Procedure: BIOPSY;  Surgeon: [name], MD;  Location: [facility];  Service: Endoscopy;  Laterality: N/A;  right colon,transverse colon, left colon, rectal biopsies  . BIOPSY  11/20/2015   Procedure: BIOPSY;  Surgeon: [name], MD;  Location: [facility];  Service: Endoscopy;;  ileal, right colon biopsy, left colon, rectum  . COLONOSCOPY  11/20/2015   Dr. [name]: Severe erythema, edema, ulcers from the anal verge to 20 cm above the anal verge without mucosal sparing, remaining colon and terminal ileum appeared normal. Biopsies from the rectum revealed fulminant active chronic colitis consistent with IBD. Pathology from terminal ileum revealed intramucosal lymphoid aggregates. Remaining colon random biopsies with inactive chronic colitis  . COLONOSCOPY WITH PROPOFOL N/A 08/30/2013   KZL:DJTTSV mucosa in the terminal ileum/COLITIS/ MILD PROCTITIS. 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  Marland Kitchen COLONOSCOPY WITH PROPOFOL N/A 11/20/2015   Dr. 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  ESOPHAGOGASTRODUODENOSCOPY  11/20/2015   Dr. [name]: Normal exam, stomach biopsied and duodenal biopsy.. Benign biopsies.  . ESOPHAGOGASTRODUODENOSCOPY (EGD) WITH PROPOFOL N/A 12/07/2012   SLF:The mucosa of the esophagus appeared normal Non-erosive gastritis  (inflammation) was found in the gastric antrum; multiple biopsies The duodenal mucosa showed no abnormalities in the bulb and second portion of the duodenum  . ESOPHAGOGASTRODUODENOSCOPY (EGD) WITH PROPOFOL N/A 11/20/2015   Dr. [name]: normal with normal biopsies, negative H.pylori   . TONSILLECTOMY      Social history: He is separated from his wife. He lives with his mother. He reports that he quit smoking about 3 years ago. He smoked 0.00 packs per day for 2.00 years. He has never used smokeless tobacco. He reports that he does not drink alcohol or use drugs.  Allergies  Allergen Reactions  . Amoxicillin-Pot Clavulanate Nausea And Vomiting and Other (See Comments)    Has patient had a PCN reaction causing immediate rash, facial/tongue/throat swelling, SOB or lightheadedness with hypotension: No Has patient had a PCN reaction causing severe rash involving mucus membranes or skin necrosis: No Has patient had a PCN reaction that required hospitalization No Has patient had a PCN reaction occurring within the last 10 years: No If all of the above answers are "NO", then may proceed with Cephalosporin use.  . Ibuprofen Other (See Comments)    Per pts MD he is unable to take this medication.   . Omeprazole Nausea And Vomiting  . Pineapple Swelling and Other (See Comments)    Reaction:  Lip swelling  . Strawberry Extract Swelling and Other (See Comments)    Reaction:  Lip swelling  . Tomato Rash    Family History  Problem Relation Age of Onset  . Asthma Mother   . Ulcers Mother   . Bipolar disorder Mother   . ADD / ADHD Father   . Diabetes Maternal Grandmother   . Diabetes Maternal Grandfather   . Colon cancer Neg Hx   . Liver disease Neg Hx      Prior to Admission medications   Medication Sig Start Date End Date Taking? Authorizing Provider  atomoxetine (STRATTERA) 100 MG capsule Take 100 mg by mouth daily. Pt takes with a 32m capsule.   Yes Historical Provider, MD  atomoxetine  (STRATTERA) 25 MG capsule Take 25 mg by mouth daily. Pt takes with a 1058mcapsule.   Yes Historical Provider, MD  budesonide-formoterol (SYMBICORT) 160-4.5 MCG/ACT inhaler Inhale 2 puffs into the lungs 2 (two) times daily.   Yes Historical Provider, MD  divalproex (DEPAKOTE) 500 MG DR tablet Take 1 tablet (500 mg total) by mouth 2 (two) times daily. 07/03/15  Yes ViPenni BombardMD  HYDROmorphone (DILAUDID) 4 MG tablet Take 1 tablet (4 mg total) by mouth every 6 (six) hours as needed for severe pain. 03/19/16  Yes JoMilton FergusonMD  loratadine (CLARITIN) 10 MG tablet Take 10 mg by mouth at bedtime.    Yes Historical Provider, MD  montelukast (SINGULAIR) 10 MG tablet Take 1 tablet (10 mg total) by mouth at bedtime. 11/13/14  Yes LaNiel HummerNP  promethazine (PHENERGAN) 12.5 MG tablet 1 PO BID. MAY USE ADDITIONAL DOSE EVERY 4 HOURS. NO MORE THAN 8 DOSES A DAY. Patient taking differently: Take 12.5 mg by mouth every 4 (four) hours as needed for nausea or vomiting. MAY USE ADDITIONAL DOSE EVERY 4 HOURS. NO MORE THAN 8 DOSES A DAY. 03/18/16  Yes SaDanie BinderMD  ranitidine (ZANTAC) 150  MG tablet Take 1 tablet (150 mg total) by mouth daily. 11/13/14  Yes [name], NP  sertraline (ZOLOFT) 100 MG tablet Take 1 tablet (100 mg total) by mouth daily. 11/13/14  Yes [name], NP  traZODone (DESYREL) 100 MG tablet Take 100 mg by mouth at bedtime.   Yes Historical Provider, MD  vancomycin (VANCOCIN) 125 MG capsule Take 1 capsule (125 mg total) by mouth 4 (four) times daily. 03/19/16  Yes Milton Ferguson, MD  albuterol (PROVENTIL HFA;VENTOLIN HFA) 108 (90 Base) MCG/ACT inhaler Inhale 2 puffs into the lungs every 4 (four) hours as needed for wheezing or shortness of breath (and/or cough).    Historical Provider, MD  albuterol (PROVENTIL) (2.5 MG/3ML) 0.083% nebulizer solution Inhale 2.5 mg into the lungs every 6 (six) hours as needed for wheezing or shortness of breath.     Historical Provider, MD    EPINEPHrine (EPIPEN 2-PAK) 0.3 mg/0.3 mL IJ SOAJ injection Inject 0.3 mg into the muscle once as needed (for severe allergic reaction).    Historical Provider, MD  fluticasone (FLONASE) 50 MCG/ACT nasal spray Place 1-2 sprays into both nostrils daily as needed for rhinitis.    Historical Provider, MD  HYDROcodone-acetaminophen (NORCO/VICODIN) 5-325 MG tablet 1 or 2 tabs PO q6 hours prn pain Patient not taking: Reported on 03/22/2016 03/01/16   Francine Graven, DO  ondansetron (ZOFRAN ODT) 8 MG disintegrating tablet Take 1 tablet (8 mg total) by mouth every 8 (eight) hours as needed for nausea or vomiting. Patient not taking: Reported on 03/19/2016 12/25/15   Jola Schmidt, MD  traMADol (ULTRAM) 50 MG tablet Take 100 mg by mouth every 6 (six) hours as needed for moderate pain (headache pain).     Historical Provider, MD  triamcinolone cream (KENALOG) 0.1 % Apply 1 application topically 2 (two) times daily as needed (for itching).    Historical Provider, MD    Physical Exam: Vitals:   03/22/16 0930 03/22/16 1000 03/22/16 1030 03/22/16 1150  BP: 125/83 113/98 126/95 138/73  Pulse: 114 117 111 (!) 104  Resp:   18 18  Temp:    98 F (36.7 C)  TempSrc:    Oral  SpO2: 99% 99% 99% 100%  Weight:    109.3 kg (240 lb 15.4 oz)  Height:    5' 8"  (1.727 m)      Constitutional:Ill-appearing 25 year old F-American man, no acute distress. Vitals:   03/22/16 0930 03/22/16 1000 03/22/16 1030 03/22/16 1150  BP: 125/83 113/98 126/95 138/73  Pulse: 114 117 111 (!) 104  Resp:   18 18  Temp:    98 F (36.7 C)  TempSrc:    Oral  SpO2: 99% 99% 99% 100%  Weight:    109.3 kg (240 lb 15.4 oz)  Height:    5' 8"  (1.727 m)   Eyes: PERRL, lids and conjunctivae normal ENMT: Mucous membranes are dry. White plaque on tongue. Posterior pharynx clear of any exudate or lesions.Normal dentition.  Neck: normal, supple, no masses, no thyromegaly Respiratory: clear to auscultation bilaterally, no wheezing, no  crackles. Normal respiratory effort. No accessory muscle use.  Cardiovascular: S1, S2, with tachycardia.. No extremity edema. 2+ pedal pulses. No carotid bruits.  Abdomen: Mild diffuse tenderness, no masses palpated. No hepatosplenomegaly. Bowel sounds positive.  Musculoskeletal: no clubbing / cyanosis. No joint deformity upper and lower extremities. Good ROM, no contractures. Normal muscle tone.  Skin: no rashes, lesions, ulcers. No induration Neurologic: CN 2-12 grossly intact. Sensation intact, DTR normal.  Strength 5/5 in all 4.  Psychiatric: He is sleepy and judgment is difficult to assess. He becomes alert and oriented x 2. Sleepy/semi-alert mood.    Labs on Admission: I have personally reviewed following labs and imaging studies  CBC:  Recent Labs Lab 03/18/16 2223 03/19/16 1445 03/22/16 0804  WBC 5.5 7.5 6.9  NEUTROABS 2.1  --  3.7  HGB 13.1 13.8 13.4  HCT 38.8* 40.4 38.6*  MCV 82.6 82.6 82.1  PLT 233 237 841   Basic Metabolic Panel:  Recent Labs Lab 03/18/16 2223 03/19/16 1445 03/22/16 0804  NA 135 138 135  K 3.7 4.2 3.7  CL 106 105 104  CO2 25 26 24   GLUCOSE 99 96 108*  BUN 7 <5* <5*  CREATININE 0.95 0.94 0.91  CALCIUM 9.1 9.2 8.9   GFR: Estimated Creatinine Clearance: 151.4 mL/min (by C-G formula based on SCr of 0.91 mg/dL). Liver Function Tests:  Recent Labs Lab 03/18/16 2223 03/19/16 1445 03/22/16 0804  AST 16 16 38  ALT 16* 14* 29  ALKPHOS 49 45 74  BILITOT 0.3 0.2* 0.4  PROT 6.5 7.0 6.8  ALBUMIN 3.5 3.7 3.3*    Recent Labs Lab 03/18/16 2223 03/19/16 1445 03/22/16 0804  LIPASE 21 19 16    No results for input(s): AMMONIA in the last 168 hours. Coagulation Profile: No results for input(s): INR, PROTIME in the last 168 hours. Cardiac Enzymes: No results for input(s): CKTOTAL, CKMB, CKMBINDEX, TROPONINI in the last 168 hours. BNP (last 3 results) No results for input(s): PROBNP in the last 8760 hours. HbA1C: No results for input(s):  HGBA1C in the last 72 hours. CBG: No results for input(s): GLUCAP in the last 168 hours. Lipid Profile: No results for input(s): CHOL, HDL, LDLCALC, TRIG, CHOLHDL, LDLDIRECT in the last 72 hours. Thyroid Function Tests: No results for input(s): TSH, T4TOTAL, FREET4, T3FREE, THYROIDAB in the last 72 hours. Anemia Panel: No results for input(s): VITAMINB12, FOLATE, FERRITIN, TIBC, IRON, RETICCTPCT in the last 72 hours. Urine analysis:    Component Value Date/Time   COLORURINE YELLOW 03/19/2016 1405   APPEARANCEUR CLEAR 03/19/2016 1405   LABSPEC 1.025 03/19/2016 1405   PHURINE 6.0 03/19/2016 1405   GLUCOSEU NEGATIVE 03/19/2016 1405   HGBUR NEGATIVE 03/19/2016 1405   BILIRUBINUR NEGATIVE 03/19/2016 1405   KETONESUR NEGATIVE 03/19/2016 1405   PROTEINUR NEGATIVE 03/19/2016 1405   UROBILINOGEN 0.2 09/04/2014 2355   NITRITE NEGATIVE 03/19/2016 1405   LEUKOCYTESUR TRACE (A) 03/19/2016 1405   Sepsis Labs: !!!!!!!!!!!!!!!!!!!!!!!!!!!!!!!!!!!!!!!!!!!! @LABRCNTIP (procalcitonin:4,lacticidven:4) ) Recent Results (from the past 240 hour(s))  Clostridium Difficile by PCR     Status: Abnormal   Collection Time: 03/14/16  1:19 PM  Result Value Ref Range Status   Toxigenic C Difficile by pcr Detected (AA) Not Detected Final    Comment: This test is for use only with liquid or soft stools; performance characteristics of other clinical specimen types have not been established.   This assay was performed by Cepheid GeneXpert(R) PCR. The performance characteristics of this assay have been determined by [employer]. Performance characteristics refer to the analytical performance of the test.      Radiological Exams on Admission: No results found.  EKG: Not ordered  Assessment/Plan Principal Problem:   C. difficile colitis Active Problems:   Ulcerative colitis ([facility])   Lower GI bleeding   Mental developmental delay   Oral thrush   Hypovolemia   Bipolar I disorder  ([facility])    1. C. difficile colitis in the  setting of ulcerative colitis. Patient has history of C. difficile colitis several months ago and was treated with oral metronidazole per history by his mother. He was recently seen in the ED on 03/18/16 for recurrent abdominal pain and bloody diarrhea. C. difficile PCR was again positive. He was discharged from the ED on oral vancomycin. He has not improved and has therefore failed outpatient therapy. -Will restart oral vancomycin, but will increase the dose to 250 mg 4 times a day (he had been taking 125 mg 4 times a day). Will hold on IV steroids. -He received a couple boluses of IV fluids in the ED. Due to tachycardia and hypovolemia, we'll continue vigorous IV fluids for hydration. -We'll start clear liquids without advancement for now. -We'll treat his pain with IV hydromorphone as needed and his nausea with Zofran as needed. We'll continue his H2 blocker. -We'll consult GI on 03/23/16.  Hematochezia/lower GI bleeding. -Secondary to C. difficile colitis/ulcerative colitis. His hemoglobin is currently stable. We'll continue to monitor his H/H daily.  Oral thrush. -We'll start oral Diflucan.  ADHD/bipolar disorder. Currently stable. We'll continue Strattera, Zoloft, and trazodone.   DVT prophylaxis:SCDs Code Status:Full code Family Communication:Discussed with mother Disposition Plan:Discharge to home when clinically appropriate, likely in a few days. Consults called: Gastroenterology pending for 03/23/16 Admission status: Phebe Colla MD [facility] Pager [phone]  If 7PM-7AM, please contact night-coverage [email or web address] Password [email or web address]  03/22/2016, 12:08 PM

## 2016-03-22 NOTE — ED Triage Notes (Signed)
Pt was dx with C Diff within the last 2 weeks. He was placed on flagyl with no improvement. Dr. [name] changed him to Vancomycin on Thursday. He has been taking medication as prescribed. Mother states he has been given Dilaudid for pain and he was last given this at midnight.

## 2016-03-22 NOTE — ED Provider Notes (Signed)
[facility] DEPT Provider Note   CSN: [id number] Arrival date & time: 03/22/16  0716     History   Chief Complaint Chief Complaint  Patient presents with  . Abdominal Pain  . Diarrhea    HPI Wayne Avila is a 25 y.o. male.  Patient with abdominal pain and bloody diarrhea. Patient has a positive C. difficile. He is on vancomycin by mouth. He states he still having pain nausea and bloody diarrhea   The history is provided by the patient. No language interpreter was used.  Abdominal Pain   This is a recurrent problem. The current episode started more than 2 days ago. The problem occurs constantly. The problem has not changed since onset.Associated with: Nothing. The pain is located in the generalized abdominal region. The pain is at a severity of 7/10. Associated symptoms include diarrhea. Pertinent negatives include frequency, hematuria and headaches.  Diarrhea   Associated symptoms include abdominal pain. Pertinent negatives include no headaches and no cough.    Past Medical History:  Diagnosis Date  . Abdominal pain, chronic, right upper quadrant   . ADHD (attention deficit hyperactivity disorder)   . Anxiety   . Asthma   . Bipolar 1 disorder ([place])   . Colitis   . Depression   . GERD (gastroesophageal reflux disease)   . Hearing loss   . HOH (hard of hearing)   . Mental developmental delay 11/15/2012  . PONV (postoperative nausea and vomiting)   . Seizures [facility])     Patient Active Problem List   Diagnosis Date Noted  . Colitis 03/22/2016  . Diarrhea 03/07/2016  . Ulcerative colitis ([place]) 12/29/2015  . CAP (community acquired pneumonia)   . 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 Aspiration pneumonitis ([place]) 11/21/2015  . Chronic blood loss anemia 11/21/2015  . Bipolar I disorder, most recent episode depressed ([place])   . Bipolar 1 disorder, manic, moderate ([place]) 11/09/2014  . Suicidal ideation   . Major depressive disorder, recurrent episode, moderate ([place])   . Mental  developmental delay 11/15/2012    Past Surgical History:  Procedure Laterality Date  . ADENOIDECTOMY    . BIOPSY N/A 12/07/2012   Procedure: GASTRIC BIOPSIES;  Surgeon: [name], MD;  Location: [facility] ORS;  Service: Endoscopy;  Laterality: N/A;  . BIOPSY N/A 08/30/2013   Procedure: BIOPSY;  Surgeon: [name], MD;  Location: [facility] ORS;  Service: Endoscopy;  Laterality: N/A;  right colon,transverse colon, left colon, rectal biopsies  . BIOPSY  11/20/2015   Procedure: BIOPSY;  Surgeon: [name], MD;  Location: [facility] ENDO SUITE;  Service: Endoscopy;;  ileal, right colon biopsy, left colon, rectum  . COLONOSCOPY  11/20/2015   Dr. [name]: Severe erythema, edema, ulcers from the anal verge to 20 cm above the anal verge without mucosal sparing, remaining colon and terminal ileum appeared normal. Biopsies from the rectum revealed fulminant active chronic colitis consistent with IBD. Pathology from terminal ileum revealed intramucosal lymphoid aggregates. Remaining colon random biopsies with inactive chronic colitis  . COLONOSCOPY WITH PROPOFOL N/A 08/30/2013   ENI:DPOEUM mucosa in the terminal ileum/COLITIS/ MILD PROCTITIS. 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  Marland Kitchen COLONOSCOPY WITH PROPOFOL N/A 11/20/2015   Dr. 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 ESOPHAGOGASTRODUODENOSCOPY  11/20/2015   Dr. [name]: Normal exam, stomach biopsied and duodenal biopsy.. Benign biopsies.  . ESOPHAGOGASTRODUODENOSCOPY (EGD) WITH PROPOFOL N/A 12/07/2012  SLF:The mucosa of the esophagus appeared normal Non-erosive gastritis (inflammation) was found in the gastric antrum; multiple biopsies The duodenal mucosa showed no abnormalities in the bulb and second portion of the duodenum  . ESOPHAGOGASTRODUODENOSCOPY (EGD) WITH PROPOFOL N/A 11/20/2015   Dr. [name]: normal with  normal biopsies, negative H.pylori   . TONSILLECTOMY         Home Medications    Prior to Admission medications   Medication Sig Start Date End Date Taking? Authorizing Provider  albuterol (PROVENTIL HFA;VENTOLIN HFA) 108 (90 Base) MCG/ACT inhaler Inhale 2 puffs into the lungs every 4 (four) hours as needed for wheezing or shortness of breath (and/or cough).    Historical Provider, MD  albuterol (PROVENTIL) (2.5 MG/3ML) 0.083% nebulizer solution Inhale 2.5 mg into the lungs every 6 (six) hours as needed for wheezing or shortness of breath.     Historical Provider, MD  atomoxetine (STRATTERA) 100 MG capsule Take 100 mg by mouth daily. Pt takes with a 62m capsule.    Historical Provider, MD  atomoxetine (STRATTERA) 25 MG capsule Take 25 mg by mouth daily. Pt takes with a 1039mcapsule.    Historical Provider, MD  budesonide-formoterol (SYMBICORT) 160-4.5 MCG/ACT inhaler Inhale 2 puffs into the lungs 2 (two) times daily.    Historical Provider, MD  divalproex (DEPAKOTE) 500 MG DR tablet Take 1 tablet (500 mg total) by mouth 2 (two) times daily. 07/03/15   ViPenni BombardMD  EPINEPHrine (EPIPEN 2-PAK) 0.3 mg/0.3 mL IJ SOAJ injection Inject 0.3 mg into the muscle once as needed (for severe allergic reaction).    Historical Provider, MD  fluticasone (FLONASE) 50 MCG/ACT nasal spray Place 1-2 sprays into both nostrils daily as needed for rhinitis.    Historical Provider, MD  HYDROcodone-acetaminophen (NORCO/VICODIN) 5-325 MG tablet 1 or 2 tabs PO q6 hours prn pain Patient taking differently: Take 1-2 tablets by mouth every 6 (six) hours as needed for moderate pain.  03/01/16   KaFrancine GravenDO  HYDROmorphone (DILAUDID) 4 MG tablet Take 1 tablet (4 mg total) by mouth every 6 (six) hours as needed for severe pain. 03/19/16   JoMilton FergusonMD  loratadine (CLARITIN) 10 MG tablet Take 10 mg by mouth at bedtime.     Historical Provider, MD  metroNIDAZOLE (FLAGYL) 500 MG tablet Take 500 mg by mouth  3 (three) times daily. 14 day course starting on 03/15/2016 03/15/16   Historical Provider, MD  montelukast (SINGULAIR) 10 MG tablet Take 1 tablet (10 mg total) by mouth at bedtime. 11/13/14   LaNiel HummerNP  ondansetron (ZOFRAN ODT) 8 MG disintegrating tablet Take 1 tablet (8 mg total) by mouth every 8 (eight) hours as needed for nausea or vomiting. Patient not taking: Reported on 03/19/2016 12/25/15   KeJola SchmidtMD  oxyCODONE-acetaminophen (PERCOCET/ROXICET) 5-325 MG tablet Take 1 tablet by mouth every 6 (six) hours as needed for severe pain.    Historical Provider, MD  promethazine (PHENERGAN) 12.5 MG tablet 1 PO BID. MAY USE ADDITIONAL DOSE EVERY 4 HOURS. NO MORE THAN 8 DOSES A DAY. Patient taking differently: Take 12.5 mg by mouth every 4 (four) hours as needed for nausea or vomiting. MAY USE ADDITIONAL DOSE EVERY 4 HOURS. NO MORE THAN 8 DOSES A DAY. 03/18/16   SaDanie BinderMD  ranitidine (ZANTAC) 150 MG tablet Take 1 tablet (150 mg total) by mouth daily. 11/13/14   LaNiel HummerNP  sertraline (ZOLOFT) 100 MG tablet Take 1 tablet (100 mg  total) by mouth daily. 11/13/14   [name], NP  traMADol (ULTRAM) 50 MG tablet Take 100 mg by mouth every 6 (six) hours as needed for moderate pain (headache pain).     Historical Provider, MD  traZODone (DESYREL) 100 MG tablet Take 100 mg by mouth at bedtime.    Historical Provider, MD  triamcinolone cream (KENALOG) 0.1 % Apply 1 application topically 2 (two) times daily as needed (for itching).    Historical Provider, MD  vancomycin (VANCOCIN) 125 MG capsule Take 1 capsule (125 mg total) by mouth 4 (four) times daily. 03/19/16   Milton Ferguson, MD    Family History Family History  Problem Relation Age of Onset  . Asthma Mother   . Ulcers Mother   . Bipolar disorder Mother   . ADD / ADHD Father   . Diabetes Maternal Grandmother   . Diabetes Maternal Grandfather   . Colon cancer Neg Hx   . Liver disease Neg Hx     Social History Social History   Substance Use Topics  . Smoking status: Former Smoker    Packs/day: 0.00    Years: 2.00    Quit date: 10/06/2012  . Smokeless tobacco: Never Used     Comment: Never smoked cigarettes  . Alcohol use No     Comment: denies usage     Allergies   Amoxicillin-pot clavulanate; Ibuprofen; Omeprazole; Pineapple; Strawberry extract; and Tomato   Review of Systems Review of Systems  Constitutional: Negative for appetite change and fatigue.  HENT: Negative for congestion, ear discharge and sinus pressure.   Eyes: Negative for discharge.  Respiratory: Negative for cough.   Cardiovascular: Negative for chest pain.  Gastrointestinal: Positive for abdominal pain and diarrhea.       Bloody diarrhea  Genitourinary: Negative for frequency and hematuria.  Musculoskeletal: Negative for back pain.  Skin: Negative for rash.  Neurological: Negative for seizures and headaches.  Psychiatric/Behavioral: Negative for hallucinations.     Physical Exam Updated Vital Signs BP 121/83   Pulse 114   Temp 99.4 F (37.4 C) (Oral)   Resp 18   Ht 5' 8"  (1.727 m)   Wt 237 lb (107.5 kg)   SpO2 99%   BMI 36.04 kg/m   Physical Exam  Constitutional: He is oriented to person, place, and time. He appears well-developed.  HENT:  Head: Normocephalic.  Eyes: Conjunctivae and EOM are normal. No scleral icterus.  Neck: Neck supple. No thyromegaly present.  Cardiovascular: Normal rate and regular rhythm.  Exam reveals no gallop and no friction rub.   No murmur heard. Pulmonary/Chest: No stridor. He has no wheezes. He has no rales. He exhibits no tenderness.  Abdominal: He exhibits no distension. There is tenderness. There is no rebound.  Tenderness all around abdomen  Musculoskeletal: Normal range of motion. He exhibits no edema.  Lymphadenopathy:    He has no cervical adenopathy.  Neurological: He is oriented to person, place, and time. He exhibits normal muscle tone. Coordination normal.  Skin: No rash  noted. No erythema.  Psychiatric: He has a normal mood and affect. His behavior is normal.     ED Treatments / Results  Labs (all labs ordered are listed, but only abnormal results are displayed) Labs Reviewed  COMPREHENSIVE METABOLIC PANEL - Abnormal; Notable for the following:       Result Value   Glucose, Bld 108 (*)    BUN <5 (*)    Albumin 3.3 (*)    All other  components within normal limits  CBC WITH DIFFERENTIAL/PLATELET - Abnormal; Notable for the following:    HCT 38.6 (*)    Monocytes Absolute 1.2 (*)    All other components within normal limits  LIPASE, BLOOD  URINALYSIS, ROUTINE W REFLEX MICROSCOPIC (NOT AT [facility])    EKG  EKG Interpretation None       Radiology No results found.  Procedures Procedures (including critical care time)  Medications Ordered in ED Medications  sodium chloride 0.9 % bolus 1,000 mL (1,000 mLs Intravenous New Bag/Given 03/22/16 0756)  HYDROmorphone (DILAUDID) injection 1 mg (1 mg Intravenous Given 03/22/16 0756)  ondansetron (ZOFRAN) injection 4 mg (4 mg Intravenous Given 03/22/16 0756)     Initial Impression / Assessment and Plan / ED Course  I have reviewed the triage vital signs and the nursing notes.  Pertinent labs & imaging results that were available during my care of the patient were reviewed by me and considered in my medical decision making (see chart for details).  Clinical Course    Patient with C. difficile infection. He will be admitted for IV antibiotics  Final Clinical Impressions(s) / ED Diagnoses   Final diagnoses:  Colitis    New Prescriptions New Prescriptions   No medications on file     Milton Ferguson, MD 03/22/16 [phone]

## 2016-03-22 NOTE — ED Notes (Signed)
Pt stable and ready for transport to [facility] room 326.

## 2016-03-22 NOTE — Consult Note (Signed)
Referring Provider: Rexene Alberts, MD Primary Care Physician:  [name], MD Primary Gastroenterologist:  Barney Drain, MD  Reason for Consultation:  Cdiff/UC  HPI: Wayne Avila is a 25 y.o. 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 difficile colitis. Last colonoscopy was in June 2017. He had chronic inactive pancolitis and active severe ulcerative proctitis at that time. Patient initially diagnosed with C. difficile back in August. Treated with Flagyl 500 mg 3 times a day for 14 days. Patient was retested on October 6 due to worsening diarrhea. C. difficile PCR was positive again and at that time he was prescribed Flagyl again because the only after hours pharmacy open did not have vancomycin in stock. Patient was seen in the ED on October 11 with persistent symptoms and at that time he was switched to vancomycin 125 mg 4 times a day although he required a prior authorization which was done October 12 and approved.   Patient has CT abdomen pelvis on 03/18/2016, noted to have scattered colonic wall thickening involving the sigmoid colon, splenic flexure, descending colon. Appendix normal, no small bowel dilation.  Patient presents to emergency department today with ongoing abdominal pain, bloody diarrhea of over one weeks duration. Reported fever of 101 last night. No vomiting. Appetite poor. No sob, dysuria, swelling. His pulse has been in the 1102. BP stable. Temp 99.4.   Last dose of Remicade in 01/2016.  Prior to Admission medications   Medication Sig Start Date End Date Taking? Authorizing Provider  atomoxetine (STRATTERA) 100 MG capsule Take 100 mg by mouth daily. Pt takes with a 48m capsule.   Yes Historical Provider, MD  atomoxetine (STRATTERA) 25 MG capsule Take 25 mg by mouth daily. Pt takes with a 107mcapsule.   Yes Historical Provider, MD  budesonide-formoterol (SYMBICORT) 160-4.5 MCG/ACT inhaler Inhale 2 puffs into the  lungs 2 (two) times daily.   Yes Historical Provider, MD  divalproex (DEPAKOTE) 500 MG DR tablet Take 1 tablet (500 mg total) by mouth 2 (two) times daily. 07/03/15  Yes ViPenni BombardMD  HYDROmorphone (DILAUDID) 4 MG tablet Take 1 tablet (4 mg total) by mouth every 6 (six) hours as needed for severe pain. 03/19/16  Yes JoMilton FergusonMD  loratadine (CLARITIN) 10 MG tablet Take 10 mg by mouth at bedtime.    Yes Historical Provider, MD  montelukast (SINGULAIR) 10 MG tablet Take 1 tablet (10 mg total) by mouth at bedtime. 11/13/14  Yes LaNiel HummerNP  promethazine (PHENERGAN) 12.5 MG tablet 1 PO BID. MAY USE ADDITIONAL DOSE EVERY 4 HOURS. NO MORE THAN 8 DOSES A DAY. Patient taking differently: Take 12.5 mg by mouth every 4 (four) hours as needed for nausea or vomiting. MAY USE ADDITIONAL DOSE EVERY 4 HOURS. NO MORE THAN 8 DOSES A DAY. 03/18/16  Yes SaDanie BinderMD  ranitidine (ZANTAC) 150 MG tablet Take 1 tablet (150 mg total) by mouth daily. 11/13/14  Yes LaNiel HummerNP  sertraline (ZOLOFT) 100 MG tablet Take 1 tablet (100 mg total) by mouth daily. 11/13/14  Yes LaNiel HummerNP  traZODone (DESYREL) 100 MG tablet Take 100 mg by mouth at bedtime.   Yes Historical Provider, MD  vancomycin (VANCOCIN) 125 MG capsule Take 1 capsule (125 mg total) by mouth 4 (four) times daily. 03/19/16  Yes JoMilton FergusonMD  albuterol (PROVENTIL HFA;VENTOLIN HFA) 108 (90 Base) MCG/ACT inhaler Inhale 2 puffs into the lungs every  4 (four) hours as needed for wheezing or shortness of breath (and/or cough).    Historical Provider, MD  albuterol (PROVENTIL) (2.5 MG/3ML) 0.083% nebulizer solution Inhale 2.5 mg into the lungs every 6 (six) hours as needed for wheezing or shortness of breath.     Historical Provider, MD  EPINEPHrine (EPIPEN 2-PAK) 0.3 mg/0.3 mL IJ SOAJ injection Inject 0.3 mg into the muscle once as needed (for severe allergic reaction).    Historical Provider, MD  fluticasone (FLONASE) 50 MCG/ACT nasal  spray Place 1-2 sprays into both nostrils daily as needed for rhinitis.    Historical Provider, MD  HYDROcodone-acetaminophen (NORCO/VICODIN) 5-325 MG tablet 1 or 2 tabs PO q6 hours prn pain Patient not taking: Reported on 03/22/2016 03/01/16   Francine Graven, DO  ondansetron (ZOFRAN ODT) 8 MG disintegrating tablet Take 1 tablet (8 mg total) by mouth every 8 (eight) hours as needed for nausea or vomiting. Patient not taking: Reported on 03/19/2016 12/25/15   Jola Schmidt, MD  traMADol (ULTRAM) 50 MG tablet Take 100 mg by mouth every 6 (six) hours as needed for moderate pain (headache pain).     Historical Provider, MD  triamcinolone cream (KENALOG) 0.1 % Apply 1 application topically 2 (two) times daily as needed (for itching).    Historical Provider, MD    Current Facility-Administered Medications  Medication Dose Route Frequency Provider Last Rate Last Dose  . 0.9 % NaCl with KCl 20 mEq/ L  infusion   Intravenous Continuous Rexene Alberts, MD 150 mL/hr at 03/22/16 1228    . acetaminophen (TYLENOL) tablet 650 mg  650 mg Oral Q6H PRN Rexene Alberts, MD       Or  . acetaminophen (TYLENOL) suppository 650 mg  650 mg Rectal Q6H PRN Rexene Alberts, MD      . albuterol (PROVENTIL) (2.5 MG/3ML) 0.083% nebulizer solution 2.5 mg  2.5 mg Inhalation Q6H PRN Rexene Alberts, MD      . atomoxetine (STRATTERA) capsule 120 mg  120 mg Oral Daily Rexene Alberts, MD      . divalproex (DEPAKOTE) DR tablet 500 mg  500 mg Oral BID Rexene Alberts, MD      . EPINEPHrine (EPI-PEN) injection 0.3 mg  0.3 mg Intramuscular Once PRN Rexene Alberts, MD      . famotidine (PEPCID) tablet 20 mg  20 mg Oral Daily Rexene Alberts, MD      . feeding supplement (BOOST / RESOURCE BREEZE) liquid 1 Container  1 Container Oral TID BM Rexene Alberts, MD   1 Container at 03/22/16 1400  . fluconazole (DIFLUCAN) tablet 100 mg  100 mg Oral Daily Rexene Alberts, MD      . fluticasone (FLONASE) 50 MCG/ACT nasal spray 1-2 spray  1-2 spray Each Nare  Daily PRN Rexene Alberts, MD      . fluticasone furoate-vilanterol (BREO ELLIPTA) 200-25 MCG/INH 1 puff  1 puff Inhalation Daily Rexene Alberts, MD      . HYDROmorphone (DILAUDID) injection 1 mg  1 mg Intravenous Q4H PRN Rexene Alberts, MD   1 mg at 03/22/16 1515  . loratadine (CLARITIN) tablet 10 mg  10 mg Oral QHS Rexene Alberts, MD      . montelukast (SINGULAIR) tablet 10 mg  10 mg Oral QHS Rexene Alberts, MD      . ondansetron [facility]) tablet 4 mg  4 mg Oral Q6H PRN Rexene Alberts, MD       Or  . ondansetron [facility]) injection 4 mg  4 mg Intravenous Q6H  PRN Rexene Alberts, MD   4 mg at 03/22/16 1515  . sertraline (ZOLOFT) tablet 100 mg  100 mg Oral Daily Rexene Alberts, MD      . traZODone (DESYREL) tablet 100 mg  100 mg Oral QHS Rexene Alberts, MD      . vancomycin (VANCOCIN) 50 mg/mL oral solution 250 mg  250 mg Oral Q6H Rexene Alberts, MD   250 mg at 03/22/16 1242    Allergies as of 03/22/2016 - Review Complete 03/22/2016  Allergen Reaction Noted  . Amoxicillin-pot clavulanate Nausea And Vomiting and Other (See Comments) 02/28/2011  . Ibuprofen Other (See Comments) 07/03/2015  . Omeprazole Nausea And Vomiting 04/20/2013  . Pineapple Swelling and Other (See Comments) 04/14/2011  . Strawberry extract Swelling and Other (See Comments) 05/08/2012  . Tomato Rash 04/14/2011    Past Medical History:  Diagnosis Date  . Abdominal pain, chronic, right upper quadrant   . ADHD (attention deficit hyperactivity disorder)   . Anxiety   . Asthma   . Bipolar 1 disorder ([place])   . Colitis   . Depression   . GERD (gastroesophageal reflux disease)   . Hearing loss   . HOH (hard of hearing)   . Mental developmental delay 11/15/2012  . PONV (postoperative nausea and vomiting)   . Seizures ([place])     Past Surgical History:  Procedure Laterality Date  . ADENOIDECTOMY    . BIOPSY N/A 12/07/2012   Procedure: GASTRIC BIOPSIES;  Surgeon: [name], MD;  Location: [facility];  Service: Endoscopy;  Laterality:  N/A;  . BIOPSY N/A 08/30/2013   Procedure: BIOPSY;  Surgeon: [name], MD;  Location: [facility];  Service: Endoscopy;  Laterality: N/A;  right colon,transverse colon, left colon, rectal biopsies  . BIOPSY  11/20/2015   Procedure: BIOPSY;  Surgeon: [name], MD;  Location: [facility] ENDO SUITE;  Service: Endoscopy;;  ileal, right colon biopsy, left colon, rectum  . COLONOSCOPY  11/20/2015   Dr. [name]: Severe erythema, edema, ulcers from the anal verge to 20 cm above the anal verge without mucosal sparing, remaining colon and terminal ileum appeared normal. Biopsies from the rectum revealed fulminant active chronic colitis consistent with IBD. Pathology from terminal ileum revealed intramucosal lymphoid aggregates. Remaining colon random biopsies with inactive chronic colitis  . COLONOSCOPY WITH PROPOFOL N/A 08/30/2013   XFG:HWEXHB mucosa in the terminal ileum/COLITIS/ MILD PROCTITIS. 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  Marland Kitchen COLONOSCOPY WITH PROPOFOL N/A 11/20/2015   Dr. 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 ESOPHAGOGASTRODUODENOSCOPY  11/20/2015   Dr. [name]: Normal exam, stomach biopsied and duodenal biopsy.. Benign biopsies.  . ESOPHAGOGASTRODUODENOSCOPY (EGD) WITH PROPOFOL N/A 12/07/2012   SLF:The mucosa of the esophagus appeared normal Non-erosive gastritis (inflammation) was found in the gastric antrum; multiple biopsies The duodenal mucosa showed no abnormalities in the bulb and second portion of the duodenum  . ESOPHAGOGASTRODUODENOSCOPY (EGD) WITH PROPOFOL N/A 11/20/2015   Dr. [name]: normal with normal biopsies, negative H.pylori   . TONSILLECTOMY      Family History  Problem Relation Age of Onset  . Asthma Mother   . Ulcers Mother   . Bipolar disorder Mother   . ADD / ADHD Father   . Diabetes Maternal Grandmother   . Diabetes  Maternal Grandfather   . Colon cancer Neg Hx   . Liver disease Neg Hx  Social History   Social History  . Marital status: Single    Spouse name: N/A  . Number of children: 0  . Years of education: 12th   Occupational History  .  Not Employed    not working   Social History Main Topics  . Smoking status: Former Smoker    Packs/day: 0.00    Years: 2.00    Quit date: 10/06/2012  . Smokeless tobacco: Never Used     Comment: Never smoked cigarettes  . Alcohol use No     Comment: denies usage  . Drug use: No  . Sexual activity: Yes   Other Topics Concern  . Not on file   Social History Narrative   Patient lives at home with girlfriend and family.   Caffeine Use: occasionally     ROS: limited response from patient, mother assists with history  General: Negative for weight loss, fever, chills, fatigue, weakness.+anorexia and nausea Eyes: Negative for vision changes.  ENT: Negative for hoarseness, difficulty swallowing , nasal congestion. CV: Negative for chest pain, angina, palpitations, dyspnea on exertion, peripheral edema.  Respiratory: Negative for dyspnea at rest, dyspnea on exertion, cough, sputum, wheezing.  GI: See history of present illness. GU:  Negative for dysuria, hematuria, urinary incontinence, urinary frequency, nocturnal urination.  MS: Negative for joint pain, low back pain.  Derm: Negative for rash or itching.  Neuro: Negative for weakness, abnormal sensation, seizure, frequent headaches, memory loss, confusion.  Psych: Negative for anxiety, depression, suicidal ideation, hallucinations.  Endo: Negative for unusual weight change.  Heme: Negative for bruising or bleeding. Allergy: Negative for rash or hives.       Physical Examination: Vital signs in last 24 hours: Temp:  [98 F (36.7 C)-99.4 F (37.4 C)] 98.2 F (36.8 C) (10/14 1346) Pulse Rate:  [98-125] 98 (10/14 1346) Resp:  [16-20] 20 (10/14 1346) BP: (113-149)/(72-98) 124/86 (10/14  1346) SpO2:  [97 %-100 %] 100 % (10/14 1346) Weight:  [237 lb (107.5 kg)-240 lb 15.4 oz (109.3 kg)] 240 lb 15.4 oz (109.3 kg) (10/14 1150) Last BM Date: 03/22/16  General: Well-nourished, well-developed in no acute distress. Mother at bedside. Head: Normocephalic, atraumatic.   Eyes: Conjunctiva pink, no icterus. Mouth: Oropharyngeal mucosa moist and pink , no lesions erythema or exudate. Neck: Supple without thyromegaly, masses, or lymphadenopathy.  Lungs: Clear to auscultation bilaterally.  Heart: Regular rate and rhythm, no murmurs rubs or gallops.  Abdomen: Bowel sounds are normal, mild diffuse tenderess, nondistended, no hepatosplenomegaly or masses, no abdominal bruits or    hernia , no rebound or guarding.   Rectal: not performed Extremities: No lower extremity edema, clubbing, deformity.  Neuro: Alert and oriented x 4 , grossly normal neurologically.  Skin: Warm and dry, no rash or jaundice.   Psych: Alert and cooperative, normal mood and affect.        Intake/Output from previous day: No intake/output data recorded. Intake/Output this shift: Total I/O In: 1200 [IV Piggyback:1200] Out: -   Lab Results: CBC  Recent Labs  03/22/16 0804  WBC 6.9  HGB 13.4  HCT 38.6*  MCV 82.1  PLT 241   BMET  Recent Labs  03/22/16 0804  NA 135  K 3.7  CL 104  CO2 24  GLUCOSE 108*  BUN <5*  CREATININE 0.91  CALCIUM 8.9   LFT  Recent Labs  03/22/16 0804  BILITOT 0.4  ALKPHOS 74  AST 38  ALT 29  PROT 6.8  ALBUMIN 3.3*  Lipase  Recent Labs  03/22/16 0804  LIPASE 16    PT/INR No results for input(s): LABPROT, INR in the last 72 hours.    Imaging Studies: Dg Chest 2 View  Result Date: 03/01/2016 CLINICAL DATA:  Centralized chest pain and right upper quadrant pain that began 2 hours ago. EXAM: CHEST  2 VIEW COMPARISON:  February 18, 2016 FINDINGS: The heart size and mediastinal contours are within normal limits. Both lungs are clear. The visualized  skeletal structures are unremarkable. IMPRESSION: No active cardiopulmonary disease. Electronically Signed   By: Dorise Bullion III M.D   On: 03/01/2016 18:09   Ct Abdomen Pelvis W Contrast  Result Date: 03/18/2016 CLINICAL DATA:  Abdominal pain. Recent diagnosis of C diff. Patient reports diarrhea. EXAM: CT ABDOMEN AND PELVIS WITH CONTRAST TECHNIQUE: Multidetector CT imaging of the abdomen and pelvis was performed using the standard protocol following bolus administration of intravenous contrast. CONTRAST:  15m ISOVUE-300 IOPAMIDOL (ISOVUE-300) INJECTION 61% COMPARISON:  CT 01/27/2016, 11/21/2015, additional priors reviewed FINDINGS: Lower chest: Lung bases are clear. Hepatobiliary: No focal liver abnormality is seen. No gallstones, gallbladder wall thickening, or biliary dilatation. Pancreas: Unremarkable. No pancreatic ductal dilatation or surrounding inflammatory changes. Spleen: Normal in size without focal abnormality. Adrenals/Urinary Tract: Adrenal glands are unremarkable. Kidneys are normal, without renal calculi, focal lesion, or hydronephrosis. Bladder is unremarkable. Stomach/Bowel: Scattered colonic wall thickening involving the sigmoid colon, splenic flexure, and descending colon. No small bowel dilatation. Stomach is physiologically distended. Appendix is normal. Vascular/Lymphatic: No significant vascular findings are present. No enlarged abdominal or pelvic lymph nodes. Reproductive: Prostate is unremarkable. Other: No abdominal wall hernia or abnormality. No abdominopelvic ascites. Musculoskeletal: There are no acute or suspicious osseous abnormalities. IMPRESSION: Scattered colonic wall thickening most prominent in the sigmoid colon, consistent with colitis. Electronically Signed   By: MJeb LeveringM.D.   On: 03/18/2016 06:09   Dg Abd 2 Views  Result Date: 03/01/2016 CLINICAL DATA:  Acute onset of central chest pain and right upper quadrant abdominal pain. Initial encounter. EXAM:  ABDOMEN - 2 VIEW COMPARISON:  Abdominal radiograph performed 02/18/2016 FINDINGS: The visualized bowel gas pattern is unremarkable. Scattered air and stool filled loops of colon are seen; no abnormal dilatation of small bowel loops is seen to suggest small bowel obstruction. No free intra-abdominal air is identified on the provided upright view. The visualized osseous structures are within normal limits; the sacroiliac joints are unremarkable in appearance. IMPRESSION: Unremarkable bowel gas pattern; no free intra-abdominal air seen. Small to moderate amount of stool noted in the colon. Electronically Signed   By: JGarald BaldingM.D.   On: 03/01/2016 18:31  [4 week]   Impression: 25y/o male with UC who present with recurrent Cdiff colitis. Treated with flagyl in 01/2016. Recurrent symptoms last week, tested positive for Cdiff again. Initially given flagyl as only open pharmacy at that hour did not have vancomycin. Patient switched to vancomycin two days ago but with ongoing symptoms as outpatient and unable to hydrate. Cannot rule out underlying UC flare as well.  Plan: 1. Supportive measures.  2. Clear liquids for now. 3. Oral vanc [id number]. 4. Agree with holding IV steroids for now. 5. reassess in AM.   We would like to thank you for the opportunity to participate in the care of Wayne Avila     LOS: 0 days

## 2016-03-23 DIAGNOSIS — D62 Acute posthemorrhagic anemia: Secondary | ICD-10-CM | POA: Diagnosis not present

## 2016-03-23 LAB — COMPREHENSIVE METABOLIC PANEL
ALT: 19 U/L (ref 17–63)
AST: 15 U/L (ref 15–41)
Albumin: 2.9 g/dL — ABNORMAL LOW (ref 3.5–5.0)
Alkaline Phosphatase: 55 U/L (ref 38–126)
Anion gap: 6 (ref 5–15)
BUN: <5 mg/dL — ABNORMAL LOW (ref 6–20)
CO2: 27 mmol/L (ref 22–32)
Calcium: 8.3 mg/dL — ABNORMAL LOW (ref 8.9–10.3)
Chloride: 103 mmol/L (ref 101–111)
Creatinine, Ser: 0.9 mg/dL (ref 0.61–1.24)
GFR calc Af Amer: >60 mL/min (ref >60)
GFR calc non Af Amer: >60 mL/min (ref >60)
Glucose, Bld: 117 mg/dL — ABNORMAL HIGH (ref 65–99)
Potassium: 3.8 mmol/L (ref 3.5–5.1)
Sodium: 136 mmol/L (ref 135–145)
Total Bilirubin: 0.2 mg/dL — ABNORMAL LOW (ref 0.3–1.2)
Total Protein: 6.2 g/dL — ABNORMAL LOW (ref 6.5–8.1)

## 2016-03-23 LAB — URINALYSIS, ROUTINE W REFLEX MICROSCOPIC
Bilirubin Urine: NEGATIVE
Glucose, UA: 100 mg/dL — AB
Hgb urine dipstick: NEGATIVE
Ketones, ur: NEGATIVE mg/dL
Leukocytes, UA: NEGATIVE
Nitrite: NEGATIVE
Protein, ur: NEGATIVE mg/dL
Specific Gravity, Urine: <1.005 — ABNORMAL LOW (ref 1.005–1.030)
pH: 6 (ref 5.0–8.0)

## 2016-03-23 LAB — CBC
HCT: 36.7 % — ABNORMAL LOW (ref 39.0–52.0)
Hemoglobin: 12.4 g/dL — ABNORMAL LOW (ref 13.0–17.0)
MCH: 28.4 pg (ref 26.0–34.0)
MCHC: 33.8 g/dL (ref 30.0–36.0)
MCV: 84.2 fL (ref 78.0–100.0)
Platelets: 228 10*3/uL (ref 150–400)
RBC: 4.36 MIL/uL (ref 4.22–5.81)
RDW: 15.8 % — ABNORMAL HIGH (ref 11.5–15.5)
WBC: 8.3 10*3/uL (ref 4.0–10.5)

## 2016-03-23 MED ORDER — METRONIDAZOLE IN NACL 5-0.79 MG/ML-% IV SOLN
500.0000 mg | Freq: Three times a day (TID) | INTRAVENOUS | Status: DC
  Administered 2016-03-23 – 2016-03-27 (×12): 500 mg via INTRAVENOUS
  Filled 2016-03-23 (×11): qty 100

## 2016-03-23 NOTE — Progress Notes (Signed)
PROGRESS NOTE    Wayne Avila  FGH:[id number] DOB: [birth year] DOA: 03/22/2016 PCP: [name], MD  Gastroenterologist: Dr. [name]   Brief Narrative:  Patient is a 25 y.o. male with PMH significant for ulcerative colitis-on Remicade, bipolar disorder, ADHD, and mental developmental delay. His was recently treated for C. difficile colitis in August 2017 with metronidazole. He presented to the ED on 03/18/16 for diarrhea. The CT scan revealed scattered colonic wall thickening most prominent in the sigmoid colon, consistent with colitis. C. difficile toxin was positive by PCR. The ED physician prescribed vancomycin. He was discharged home, but his symptoms persisted. He was admitted on 03/22/16 for further management of C. difficile colitis after incompleted outpatient therapy.    Assessment & Plan:   Principal Problem:   C. difficile colitis Active Problems:   Ulcerative colitis ([place])   Lower GI bleeding   Mental developmental delay   Oral thrush   Hypovolemia   Bipolar I disorder ([facility])   1. C. difficile colitis in the setting of ulcerative colitis. Patient was started on vigorous IV fluids. Oral vancomycin was restarted, but the dose was increased to 250 mg 4 times daily. Starting steroids was contemplated, but it was held in the setting of an active C. difficile infection. H2 blocker was restarted. As needed IV hydromorphone and Zofran ordered for pain and nausea. Clear liquid diet was started. -GI was consulted. They agree with medical management, but added IV Flagyl. They discontinued Pepcid. -His diet has been progressed to soft/low residue by GI. Will hold on probiotic for now.  Hematochezia/lower GI bleeding causing acute blood loss anemia. -Secondary to C. difficile colitis/ulcerative colitis. -His hemoglobin was 13.4 on admission. It is 12.4 today. We'll continue to monitor as there is no need for blood transfusion at this time.  Oral thrush. -Oral Diflucan was  started. It will be continued for several days. He may have gotten oral thrush from his steroid inhaler.  ADHD/bipolar disorder. Currently stable. He was continued on Strattera, Zoloft, and trazodone.  Sinus tachycardia. The patient was started on vigorous IV fluids for volume depletion.Marland Kitchen He is still mildly tachycardic. We'll continue vigorous IV fluids. -We'll order TSH and free T4 for further evaluation.   DVT prophylaxis: SCDs Code Status: Full code Family Communication: Discussed with mother Disposition Plan: Discharged home clinically appropriate   Consultants:   Gastroenterology  Procedures:   None  Antimicrobials:  IV Flagyl 03/23/16>>  Oral vancomycin 03/22/16>>    Subjective: Patient continues to have multiple loose stools, mostly bloody. His mother is in the room and she confirms it. He denies vomiting.  Objective: Vitals:   03/22/16 2113 03/23/16 0500 03/23/16 0603 03/23/16 0817  BP: (!) 150/92  125/71   Pulse: (!) 123  (!) 115   Resp: 20     Temp: 99.6 F (37.6 C)  99.6 F (37.6 C)   TempSrc: Oral  Oral   SpO2: 100%  99% 100%  Weight:  109.9 kg (242 lb 4.8 oz)    Height:        Intake/Output Summary (Last 24 hours) at 03/23/16 1106 Last data filed at 03/23/16 1000  Gross per 24 hour  Intake             3249 ml  Output                0 ml  Net             3249 ml   [employer]  03/22/16 0732 03/22/16 1150 03/23/16 0500  Weight: 107.5 kg (237 lb) 109.3 kg (240 lb 15.4 oz) 109.9 kg (242 lb 4.8 oz)    Examination:  General exam: Appears calm and Uncomfortable.  Respiratory system: Clear to auscultation. Respiratory effort normal. Cardiovascular system: S1, S2, with tachycardia. No pedal edema. Gastrointestinal system: Abdomen is nondistended, soft and with mild diffuse tenderness. No organomegaly or masses felt. Normal bowel sounds heard. Central nervous system: Alert and oriented. No focal neurological deficits. Extremities: Symmetric 5  x 5 power. Skin: No rashes, lesions or ulcers Psychiatry: Judgement and insight appear normal. He has a flat affect.    Data Reviewed: I have personally reviewed following labs and imaging studies  CBC:  Recent Labs Lab 03/18/16 2223 03/19/16 1445 03/22/16 0804 03/23/16 0634  WBC 5.5 7.5 6.9 8.3  NEUTROABS 2.1  --  3.7  --   HGB 13.1 13.8 13.4 12.4*  HCT 38.8* 40.4 38.6* 36.7*  MCV 82.6 82.6 82.1 84.2  PLT 233 237 241 147   Basic Metabolic Panel:  Recent Labs Lab 03/18/16 2223 03/19/16 1445 03/22/16 0804 03/23/16 0634  NA 135 138 135 136  K 3.7 4.2 3.7 3.8  CL 106 105 104 103  CO2 25 26 24 27   GLUCOSE 99 96 108* 117*  BUN 7 <5* <5* <5*  CREATININE 0.95 0.94 0.91 0.90  CALCIUM 9.1 9.2 8.9 8.3*   GFR: Estimated Creatinine Clearance: 153.5 mL/min (by C-G formula based on SCr of 0.9 mg/dL). Liver Function Tests:  Recent Labs Lab 03/18/16 2223 03/19/16 1445 03/22/16 0804 03/23/16 0634  AST 16 16 38 15  ALT 16* 14* 29 19  ALKPHOS 49 45 74 55  BILITOT 0.3 0.2* 0.4 0.2*  PROT 6.5 7.0 6.8 6.2*  ALBUMIN 3.5 3.7 3.3* 2.9*    Recent Labs Lab 03/18/16 2223 03/19/16 1445 03/22/16 0804  LIPASE 21 19 16    No results for input(s): AMMONIA in the last 168 hours. Coagulation Profile: No results for input(s): INR, PROTIME in the last 168 hours. Cardiac Enzymes: No results for input(s): CKTOTAL, CKMB, CKMBINDEX, TROPONINI in the last 168 hours. BNP (last 3 results) No results for input(s): PROBNP in the last 8760 hours. HbA1C: No results for input(s): HGBA1C in the last 72 hours. CBG:  Recent Labs Lab 03/22/16 1615  GLUCAP 109*   Lipid Profile: No results for input(s): CHOL, HDL, LDLCALC, TRIG, CHOLHDL, LDLDIRECT in the last 72 hours. Thyroid Function Tests: No results for input(s): TSH, T4TOTAL, FREET4, T3FREE, THYROIDAB in the last 72 hours. Anemia Panel: No results for input(s): VITAMINB12, FOLATE, FERRITIN, TIBC, IRON, RETICCTPCT in the last 72  hours. Sepsis Labs: No results for input(s): PROCALCITON, LATICACIDVEN in the last 168 hours.  Recent Results (from the past 240 hour(s))  Clostridium Difficile by PCR     Status: Abnormal   Collection Time: 03/14/16  1:19 PM  Result Value Ref Range Status   Toxigenic C Difficile by pcr Detected (AA) Not Detected Final    Comment: This test is for use only with liquid or soft stools; performance characteristics of other clinical specimen types have not been established.   This assay was performed by Cepheid GeneXpert(R) PCR. The performance characteristics of this assay have been determined by [employer]. Performance characteristics refer to the analytical performance of the test.          Radiology Studies: No results found.      Scheduled Meds: . atomoxetine  120 mg Oral Daily  .  divalproex  500 mg Oral BID  . feeding supplement  1 Container Oral TID BM  . fluconazole  100 mg Oral Daily  . fluticasone furoate-vilanterol  1 puff Inhalation Daily  . loratadine  10 mg Oral QHS  . montelukast  10 mg Oral QHS  . sertraline  100 mg Oral Daily  . traZODone  100 mg Oral QHS  . vancomycin  250 mg Oral Q6H   Continuous Infusions: . 0.9 % NaCl with KCl 20 mEq / L 150 mL/hr at 03/23/16 0338     LOS: 1 day    Time spent: 50 minutes    Rexene Alberts, MD [facility] Pager [phone]  If 7PM-7AM, please contact night-coverage [email or web address] Password [email or web address] 03/23/2016, 11:06 AM

## 2016-03-23 NOTE — Progress Notes (Signed)
Subjective:  Mom reports BM every 30 minutes over night. States no sleep. Patient states he feels no better. Sleeping when entering room.   Objective: Vital signs in last 24 hours: Temp:  [98 F (36.7 C)-99.6 F (37.6 C)] 99.6 F (37.6 C) (10/15 0603) Pulse Rate:  [98-123] 115 (10/15 0603) Resp:  [18-20] 20 (10/14 2113) BP: (124-150)/(71-93) 125/71 (10/15 0603) SpO2:  [99 %-100 %] 100 % (10/15 0817) Weight:  [240 lb 15.4 oz (109.3 kg)-242 lb 4.8 oz (109.9 kg)] 242 lb 4.8 oz (109.9 kg) (10/15 0500) Last BM Date: 03/23/16 General:   Sleeping, nad Head:  Normocephalic and atraumatic. Eyes:  Sclera clear, no icterus.  Abdomen:  Soft, nontender and nondistended.  Normal bowel sounds, without guarding, and without rebound.   Extremities:  Without clubbing, deformity or edema. Neurologic:  Alert  . Skin:  Intact without significant lesions or rashes. Psych: does not carry on conversation  Intake/Output from previous day: 10/14 0701 - 10/15 0700 In: 3294 [P.O.:240; I.V.:1854; IV Piggyback:1200] Out: -  Intake/Output this shift: No intake/output data recorded.  Lab Results: CBC  Recent Labs  03/22/16 0804 03/23/16 0634  WBC 6.9 8.3  HGB 13.4 12.4*  HCT 38.6* 36.7*  MCV 82.1 84.2  PLT 241 228   BMET  Recent Labs  03/22/16 0804 03/23/16 0634  NA 135 136  K 3.7 3.8  CL 104 103  CO2 24 27  GLUCOSE 108* 117*  BUN <5* <5*  CREATININE 0.91 0.90  CALCIUM 8.9 8.3*   LFTs  Recent Labs  03/22/16 0804 03/23/16 0634  BILITOT 0.4 0.2*  ALKPHOS 74 55  AST 38 15  ALT 29 19  PROT 6.8 6.2*  ALBUMIN 3.3* 2.9*    Recent Labs  03/22/16 0804  LIPASE 16   PT/INR No results for input(s): LABPROT, INR in the last 72 hours.    Imaging Studies: Dg Chest 2 View  Result Date: 03/01/2016 CLINICAL DATA:  Centralized chest pain and right upper quadrant pain that began 2 hours ago. EXAM: CHEST  2 VIEW COMPARISON:  February 18, 2016 FINDINGS: The heart size and mediastinal  contours are within normal limits. Both lungs are clear. The visualized skeletal structures are unremarkable. IMPRESSION: No active cardiopulmonary disease. Electronically Signed   By: Dorise Bullion III M.D   On: 03/01/2016 18:09   Ct Abdomen Pelvis W Contrast  Result Date: 03/18/2016 CLINICAL DATA:  Abdominal pain. Recent diagnosis of C diff. Patient reports diarrhea. EXAM: CT ABDOMEN AND PELVIS WITH CONTRAST TECHNIQUE: Multidetector CT imaging of the abdomen and pelvis was performed using the standard protocol following bolus administration of intravenous contrast. CONTRAST:  136m ISOVUE-300 IOPAMIDOL (ISOVUE-300) INJECTION 61% COMPARISON:  CT 01/27/2016, 11/21/2015, additional priors reviewed FINDINGS: Lower chest: Lung bases are clear. Hepatobiliary: No focal liver abnormality is seen. No gallstones, gallbladder wall thickening, or biliary dilatation. Pancreas: Unremarkable. No pancreatic ductal dilatation or surrounding inflammatory changes. Spleen: Normal in size without focal abnormality. Adrenals/Urinary Tract: Adrenal glands are unremarkable. Kidneys are normal, without renal calculi, focal lesion, or hydronephrosis. Bladder is unremarkable. Stomach/Bowel: Scattered colonic wall thickening involving the sigmoid colon, splenic flexure, and descending colon. No small bowel dilatation. Stomach is physiologically distended. Appendix is normal. Vascular/Lymphatic: No significant vascular findings are present. No enlarged abdominal or pelvic lymph nodes. Reproductive: Prostate is unremarkable. Other: No abdominal wall hernia or abnormality. No abdominopelvic ascites. Musculoskeletal: There are no acute or suspicious osseous abnormalities. IMPRESSION: Scattered colonic wall thickening most prominent in the sigmoid colon,  consistent with colitis. Electronically Signed   By: Jeb Levering M.D.   On: 03/18/2016 06:09   Dg Abd 2 Views  Result Date: 03/01/2016 CLINICAL DATA:  Acute onset of central chest  pain and right upper quadrant abdominal pain. Initial encounter. EXAM: ABDOMEN - 2 VIEW COMPARISON:  Abdominal radiograph performed 02/18/2016 FINDINGS: The visualized bowel gas pattern is unremarkable. Scattered air and stool filled loops of colon are seen; no abnormal dilatation of small bowel loops is seen to suggest small bowel obstruction. No free intra-abdominal air is identified on the provided upright view. The visualized osseous structures are within normal limits; the sacroiliac joints are unremarkable in appearance. IMPRESSION: Unremarkable bowel gas pattern; no free intra-abdominal air seen. Small to moderate amount of stool noted in the colon. Electronically Signed   By: Garald Balding M.D.   On: 03/01/2016 18:31  [2 weeks]   Assessment: 25 y/o male with UC who present with recurrent Cdiff colitis. Treated with flagyl in 01/2016. Recurrent symptoms last week, tested positive for Cdiff again. Initially given flagyl as only open pharmacy at that hour did not have vancomycin. Patient switched to vancomycin two days ago prior to admission but with ongoing symptoms as outpatient and unable to hydrate. Cannot rule out underlying UC flare as well.  Patient and mother continue to report loose stool every 30-60 minutes. Some small amount of blood in stool. Only one documented stool since admission by nursing staff. I personally spoke to patient's nurse. She knows of patient going to restroom of twice since 7am, report from last night, "a couple of stools".   Plan: 1. I have requested that nursing staff be informed of stools/allow visualization so that appropriate documentation can occur.  2. Soft diet/low residue. No dairy.  3. Add IV Flagyl 500 mg q 8 hour. Continue oral vanc 251m QID. No probiotic for now.  Discussed with Dr. RGala Romney 4. D/c pepcid.   LLaureen Ochs LBernarda CaffeyRDundy County HospitalGastroenter[facility] [phone]/15/[id number] AM    LOS: 1 day

## 2016-03-24 DIAGNOSIS — R946 Abnormal results of thyroid function studies: Secondary | ICD-10-CM | POA: Insufficient documentation

## 2016-03-24 DIAGNOSIS — E861 Hypovolemia: Secondary | ICD-10-CM

## 2016-03-24 LAB — BASIC METABOLIC PANEL
Anion gap: 7 (ref 5–15)
BUN: <5 mg/dL — ABNORMAL LOW (ref 6–20)
CO2: 25 mmol/L (ref 22–32)
Calcium: 8.2 mg/dL — ABNORMAL LOW (ref 8.9–10.3)
Chloride: 105 mmol/L (ref 101–111)
Creatinine, Ser: 0.75 mg/dL (ref 0.61–1.24)
GFR calc Af Amer: >60 mL/min (ref >60)
GFR calc non Af Amer: >60 mL/min (ref >60)
Glucose, Bld: 98 mg/dL (ref 65–99)
Potassium: 3.6 mmol/L (ref 3.5–5.1)
Sodium: 137 mmol/L (ref 135–145)

## 2016-03-24 LAB — CBC
HCT: 33.1 % — ABNORMAL LOW (ref 39.0–52.0)
Hemoglobin: 11.2 g/dL — ABNORMAL LOW (ref 13.0–17.0)
MCH: 28.1 pg (ref 26.0–34.0)
MCHC: 33.8 g/dL (ref 30.0–36.0)
MCV: 83 fL (ref 78.0–100.0)
Platelets: 197 10*3/uL (ref 150–400)
RBC: 3.99 MIL/uL — ABNORMAL LOW (ref 4.22–5.81)
RDW: 15.7 % — ABNORMAL HIGH (ref 11.5–15.5)
WBC: 8.3 10*3/uL (ref 4.0–10.5)

## 2016-03-24 LAB — T4, FREE: Free T4: 0.49 ng/dL — ABNORMAL LOW (ref 0.61–1.12)

## 2016-03-24 LAB — TSH: TSH: 2.909 u[IU]/mL (ref 0.350–4.500)

## 2016-03-24 MED ORDER — CHOLESTYRAMINE 4 G PO PACK
2.0000 g | PACK | Freq: Every day | ORAL | Status: DC
  Administered 2016-03-24 – 2016-03-26 (×3): 2 g via ORAL
  Filled 2016-03-24 (×5): qty 1

## 2016-03-24 MED ORDER — HYDROMORPHONE HCL 1 MG/ML IJ SOLN
1.0000 mg | INTRAMUSCULAR | Status: DC | PRN
  Administered 2016-03-24 – 2016-03-28 (×32): 1 mg via INTRAVENOUS
  Filled 2016-03-24 (×34): qty 1

## 2016-03-24 NOTE — Progress Notes (Addendum)
Initial Nutrition Assessment  DOCUMENTATION CODES:  Obesity class II    INTERVENTION:  Boost Breeze (pt doesn't like the taste). He has contraindications to taking the ProStat per EPIC.  Offer high protein snacks between meals  NUTRITION DIAGNOSIS:     Increased protein and energy needs related to Acute illness ( c.diff) AEB est needs and pt hx   GOAL:  Pt to meet >/= 90% of their estimated nutrition needs      MONITOR:  Po intake, labs and wt trends     REASON FOR ASSESSMENT:   Malnutrition Screening Tool    ASSESSMENT:  Patient presents with abdominal pain and bloody stools.  Hx of UC, and mental illness. He has been to the ED 13 times in the past 6 months. CT scan completed and indicating colitis per MD and he was positive for C.diff.  He continues to have diarrhea. His mom is here with him and both breakfast and lunch trays are barely touched 0-25% of either meal consumed. He has a Librarian, academic here but doesn't like the taste. He is not to have dairy at this time per MD. Bonne Dolores to order the ProStat but pt has contraindications to this product. Nutrition-Focused physical exam findings are  no fat depletion, no muscle depletion, and no edema. His weight hx shows and increase of 5% in 1 week which is likely an error. Would expect the pt weight to be going down not up.   Recent Labs Lab 03/22/16 0804 03/23/16 0634 03/24/16 0642  NA 135 136 137  K 3.7 3.8 3.6  CL 104 103 105  CO2 24 27 25   BUN <5* <5* <5*  CREATININE 0.91 0.90 0.75  CALCIUM 8.9 8.3* 8.2*  GLUCOSE 108* 117* 98   Labs: reviewed  Meds: depakote, flagyl, zoloft, vancomycin  Diet Order:  DIET SOFT Room service appropriate? Yes; Fluid consistency: Thin  Skin:   no breakdown noted  Last BM:  10/16 watery stools  Height:   Ht Readings from Last 1 Encounters:  03/22/16 5' 8"  (1.727 m)    Weight:   Wt Readings from Last 1 Encounters:  03/24/16 249 lb 3.2 oz (113 kg)    Ideal Body Weight:  70  kg  BMI:  Body mass index is 37.89 kg/m.  Estimated Nutritional Needs:   Kcal:  2000-2300  Protein:  120-130 gr  Fluid:  >2.0 liters daily  EDUCATION NEEDS: No issues noted at this time    Colman Cater MS,RD,CSG,LDN Office: #[phone] Pager: [phone]

## 2016-03-24 NOTE — Progress Notes (Signed)
    Subjective: 6 loose stools overnight, 8 this morning. Associated intermittent low-volume hematochezia.   Objective: Vital signs in last 24 hours: Temp:  [98.5 F (36.9 C)-99 F (37.2 C)] 99 F (37.2 C) (10/16 0522) Pulse Rate:  [104-123] 107 (10/16 0522) Resp:  [20] 20 (10/16 0522) BP: (116-125)/(56-78) 116/66 (10/16 0522) SpO2:  [96 %-99 %] 96 % (10/16 0743) Weight:  [249 lb 3.2 oz (113 kg)] 249 lb 3.2 oz (113 kg) (10/16 0522) Last BM Date: 03/23/16 General:   Resting with eyes closed. Awakens to verbal stimuli.  Head:  Normocephalic and atraumatic. Abdomen:  Bowel sounds present, soft, mild TTP LLQ/lower abdomen. No rebound or guarding.  Extremities:  Without edema. Neurologic:  Alert and  oriented x4 Psych:  Flat affect   Intake/Output from previous day: 10/15 0701 - 10/16 0700 In: 3429.2 [P.O.:720; I.V.:2409.2; IV Piggyback:300] Out: -  Intake/Output this shift: Total I/O In: 868.3 [I.V.:868.3] Out: -   Lab Results:  Recent Labs  03/22/16 0804 03/23/16 0634 03/24/16 0642  WBC 6.9 8.3 8.3  HGB 13.4 12.4* 11.2*  HCT 38.6* 36.7* 33.1*  PLT 241 228 197   BMET  Recent Labs  03/22/16 0804 03/23/16 0634 03/24/16 0642  NA 135 136 137  K 3.7 3.8 3.6  CL 104 103 105  CO2 24 27 25   GLUCOSE 108* 117* 98  BUN <5* <5* <5*  CREATININE 0.91 0.90 0.75  CALCIUM 8.9 8.3* 8.2*   LFT  Recent Labs  03/22/16 0804 03/23/16 0634  PROT 6.8 6.2*  ALBUMIN 3.3* 2.9*  AST 38 15  ALT 29 19  ALKPHOS 74 55  BILITOT 0.4 0.2*    Assessment: 25 year old male with history of ulcerative colitis, presenting with recurrent Cdiff. Prior treatment of Cdiff with Flagyl in Aug 2017. Still with persistent loose stool but without worsening abdominal pain, distension, leukocytosis, or worsening renal function. Continue oral vancomycin 250 mg QID, Flagyl 500 mg IV every 8 hours, and add off-label Questran. Monitor for any worsening of symptoms; may want to consider topical therapy  with vanc enemas if no clinical improvement in near future.    Plan: Add off-label Questran for supportive measures (to be taken at 1300 daily, which will be an hour after vancomycin dosing. Discussed with pharmacy).  Continue Flagyl IV every 8 hours Continue Vancomycin 250 mg QID Avoid acid suppression therapy Low-residue diet   [name], ANP-BC [facility]    LOS: 2 days    03/24/2016, 9:15 AM

## 2016-03-24 NOTE — Progress Notes (Signed)
PROGRESS NOTE    Wayne Avila  XYB:[id number] DOB: July 18, 1990 DOA: 03/22/2016 PCP: [name], MD  Gastroenterologist: Dr. [name]   Brief Narrative:  Patient is a 25 y.o. male with PMH significant for ulcerative colitis-on Remicade, bipolar disorder, ADHD, and mental developmental delay. His was recently treated for C. difficile colitis in August 2017 with metronidazole. He presented to the ED on 03/18/16 for diarrhea. The CT scan revealed scattered colonic wall thickening most prominent in the sigmoid colon, consistent with colitis. C. difficile toxin was positive by PCR. The ED physician prescribed vancomycin on 03/18/2016.Marland Kitchen He was discharged home, but his symptoms persisted. He was admitted on 03/22/16 for further management of C. difficile colitis after incompleted outpatient therapy.    Assessment & Plan:   Principal Problem:   C. difficile colitis Active Problems:   Ulcerative colitis ([place])   Lower GI bleeding   Mental developmental delay   Oral thrush   Hypovolemia   Bipolar I disorder ([place])   Acute blood loss anemia   1. C. difficile colitis in the setting of ulcerative colitis. Patient was started on vigorous IV fluids. Oral vancomycin was restarted, but the dose was increased to 250 mg 4 times daily. Starting steroids was contemplated, but it was held in the setting of an active C. difficile infection. H2 blocker was restarted. As needed IV hydromorphone and Zofran ordered for pain and nausea. C -GI was consulted. They agreed with medical management, but added IV Flagyl. They discontinued Pepcid. They added off label Questran due to persistent bloody diarrhea. -His diet was progressed to soft/low residue by GI. Will hold on probiotic for now. -He complained of abdominal pain earlier, so IV hydromorphone dosing was increased.  Hematochezia/lower GI bleeding causing acute blood loss anemia. -His hemoglobin was 13.4 on admission. It has slowly drifted down to 11.2.  The decrease in his hemoglobin is both from hemodilution from IV fluids and hematochezia. We'll continue to monitor as there is no need for blood transfusion at this time.  Oral thrush. -Oral Diflucan was started. It will be continued for several days. He may have gotten oral thrush from his steroid inhaler.  ADHD/bipolar disorder. Currently stable. He was continued on Strattera, Zoloft, and trazodone.  Sinus tachycardia. The patient was started on vigorous IV fluids for volume depletion.Marland Kitchen He is less tachycardic. We'll continue IV fluids.  Low free T4. -His TSH was within normal limits, but his free T4 was low at 0.49. Recommend follow-up TSH and free T4 in 3-6 months as the patient may have sick euthyroid syndrome.   DVT prophylaxis: SCDs Code Status: Full code Family Communication: Discussed with mother Disposition Plan: Discharged home clinically appropriate   Consultants:   Gastroenterology  Procedures:   None  Antimicrobials:  IV Flagyl 03/23/16>>  Oral vancomycin 03/22/16>>    Subjective: Patient continues to have multiple loose stools, mostly bloody. He complains of 10 over 10 abdominal pain which he says is "all over".  Objective: Vitals:   03/23/16 1237 03/23/16 2200 03/24/16 0522 03/24/16 0743  BP: (!) 124/56 125/78 116/66   Pulse: (!) 123 (!) 104 (!) 107   Resp: 20 20 20    Temp: 98.7 F (37.1 C) 98.5 F (36.9 C) 99 F (37.2 C)   TempSrc: Oral Oral Oral   SpO2: 97% 99% 97% 96%  Weight:   113 kg (249 lb 3.2 oz)   Height:        Intake/Output Summary (Last 24 hours) at 03/24/16 1545 Last  data filed at 03/24/16 1339  Gross per 24 hour  Intake             2905 ml  Output                0 ml  Net             2905 ml   Filed Weights   03/22/16 1150 03/23/16 0500 03/24/16 0522  Weight: 109.3 kg (240 lb 15.4 oz) 109.9 kg (242 lb 4.8 oz) 113 kg (249 lb 3.2 oz)    Examination:  General exam: Appears calm and uncomfortable.  Respiratory system:  Clear to auscultation. Respiratory effort normal. Cardiovascular system: S1, S2, with mild tachycardia. No pedal edema. Gastrointestinal system: Abdomen is nondistended, soft and with mild-moderate diffuse tenderness. No organomegaly or masses felt. Normal bowel sounds heard. Central nervous system: Alert and oriented. No focal neurological deficits. Extremities: Symmetric 5 x 5 power. Skin: No rashes, lesions or ulcers Psychiatry: Judgement and insight appear normal. He has a flat affect.    Data Reviewed: I have personally reviewed following labs and imaging studies  CBC:  Recent Labs Lab 03/18/16 2223 03/19/16 1445 03/22/16 0804 03/23/16 0634 03/24/16 0642  WBC 5.5 7.5 6.9 8.3 8.3  NEUTROABS 2.1  --  3.7  --   --   HGB 13.1 13.8 13.4 12.4* 11.2*  HCT 38.8* 40.4 38.6* 36.7* 33.1*  MCV 82.6 82.6 82.1 84.2 83.0  PLT 233 237 241 228 803   Basic Metabolic Panel:  Recent Labs Lab 03/18/16 2223 03/19/16 1445 03/22/16 0804 03/23/16 0634 03/24/16 0642  NA 135 138 135 136 137  K 3.7 4.2 3.7 3.8 3.6  CL 106 105 104 103 105  CO2 25 26 24 27 25   GLUCOSE 99 96 108* 117* 98  BUN 7 <5* <5* <5* <5*  CREATININE 0.95 0.94 0.91 0.90 0.75  CALCIUM 9.1 9.2 8.9 8.3* 8.2*   GFR: Estimated Creatinine Clearance: 175.1 mL/min (by C-G formula based on SCr of 0.75 mg/dL). Liver Function Tests:  Recent Labs Lab 03/18/16 2223 03/19/16 1445 03/22/16 0804 03/23/16 0634  AST 16 16 38 15  ALT 16* 14* 29 19  ALKPHOS 49 45 74 55  BILITOT 0.3 0.2* 0.4 0.2*  PROT 6.5 7.0 6.8 6.2*  ALBUMIN 3.5 3.7 3.3* 2.9*    Recent Labs Lab 03/18/16 2223 03/19/16 1445 03/22/16 0804  LIPASE 21 19 16    No results for input(s): AMMONIA in the last 168 hours. Coagulation Profile: No results for input(s): INR, PROTIME in the last 168 hours. Cardiac Enzymes: No results for input(s): CKTOTAL, CKMB, CKMBINDEX, TROPONINI in the last 168 hours. BNP (last 3 results) No results for input(s): PROBNP in  the last 8760 hours. HbA1C: No results for input(s): HGBA1C in the last 72 hours. CBG:  Recent Labs Lab 03/22/16 1615  GLUCAP 109*   Lipid Profile: No results for input(s): CHOL, HDL, LDLCALC, TRIG, CHOLHDL, LDLDIRECT in the last 72 hours. Thyroid Function Tests:  Recent Labs  03/24/16 0642  TSH 2.909  FREET4 0.49*   Anemia Panel: No results for input(s): VITAMINB12, FOLATE, FERRITIN, TIBC, IRON, RETICCTPCT in the last 72 hours. Sepsis Labs: No results for input(s): PROCALCITON, LATICACIDVEN in the last 168 hours.  No results found for this or any previous visit (from the past 240 hour(s)).       Radiology Studies: No results found.      Scheduled Meds: . atomoxetine  120 mg Oral Daily  . cholestyramine  2 g Oral Daily  . divalproex  500 mg Oral BID  . feeding supplement  1 Container Oral TID BM  . fluconazole  100 mg Oral Daily  . fluticasone furoate-vilanterol  1 puff Inhalation Daily  . loratadine  10 mg Oral QHS  . metronidazole  500 mg Intravenous Q8H  . montelukast  10 mg Oral QHS  . sertraline  100 mg Oral Daily  . traZODone  100 mg Oral QHS  . vancomycin  250 mg Oral Q6H   Continuous Infusions: . 0.9 % NaCl with KCl 20 mEq / L 100 mL/hr at 03/24/16 1338     LOS: 2 days    Time spent: 48 minutes    Rexene Alberts, MD [facility] Pager [phone]  If 7PM-7AM, please contact night-coverage [email or web address] Password [email or web address] 03/24/2016, 3:45 PM

## 2016-03-25 LAB — CBC
HCT: 35.2 % — ABNORMAL LOW (ref 39.0–52.0)
Hemoglobin: 11.9 g/dL — ABNORMAL LOW (ref 13.0–17.0)
MCH: 28.1 pg (ref 26.0–34.0)
MCHC: 33.8 g/dL (ref 30.0–36.0)
MCV: 83.2 fL (ref 78.0–100.0)
Platelets: 219 10*3/uL (ref 150–400)
RBC: 4.23 MIL/uL (ref 4.22–5.81)
RDW: 15.6 % — ABNORMAL HIGH (ref 11.5–15.5)
WBC: 8.3 10*3/uL (ref 4.0–10.5)

## 2016-03-25 MED ORDER — FLUCONAZOLE 100 MG PO TABS
100.0000 mg | ORAL_TABLET | Freq: Every day | ORAL | Status: AC
  Administered 2016-03-26: 100 mg via ORAL
  Filled 2016-03-25: qty 1

## 2016-03-25 NOTE — Progress Notes (Signed)
    Subjective: Patient does not speak much, which is his baseline. Mother speaks for him. States stool frequency is tapering down. Less amounts at a time. 6 overnight. Several this morning. Less rectal bleeding. Decreased appetite. No N/V. Patient trying to sleep.   Objective: Vital signs in last 24 hours: Temp:  [98.6 F (37 C)-99 F (37.2 C)] 98.8 F (37.1 C) (10/17 0649) Pulse Rate:  [91-109] 109 (10/17 0649) Resp:  [20] 20 (10/17 0649) BP: (106-126)/(61-78) 123/67 (10/17 0649) SpO2:  [97 %-99 %] 97 % (10/17 0830) Weight:  [251 lb 0.6 oz (113.9 kg)] 251 lb 0.6 oz (113.9 kg) (10/17 0505) Last BM Date: 03/25/16 General:   Resting with eyes closed but responds to verbal stimuli, flat affect  Head:  Normocephalic and atraumatic. Abdomen:  Bowel sounds present, soft, TTP lower abdomen, no rebound or guarding Extremities:  Without edema. Neurologic:  Alert and  oriented x4 Psych:  Alert and cooperative. N  Intake/Output from previous day: 10/16 0701 - 10/17 0700 In: 3010 [I.V.:2710; IV Piggyback:300] Out: -  Intake/Output this shift: No intake/output data recorded.  Lab Results:  Recent Labs  03/23/16 0634 03/24/16 0642 03/25/16 0835  WBC 8.3 8.3 8.3  HGB 12.4* 11.2* 11.9*  HCT 36.7* 33.1* 35.2*  PLT 228 197 219   BMET  Recent Labs  03/23/16 0634 03/24/16 0642  NA 136 137  K 3.8 3.6  CL 103 105  CO2 27 25  GLUCOSE 117* 98  BUN <5* <5*  CREATININE 0.90 0.75  CALCIUM 8.3* 8.2*   LFT  Recent Labs  03/23/16 0634  PROT 6.2*  ALBUMIN 2.9*  AST 15  ALT 19  ALKPHOS 55  BILITOT 0.2*    Assessment: 25 year old male with history of ulcerative colitis, presenting with recurrent Cdiff. Prior treatment of Cdiff with Flagyl in Aug 2017. Off-label low dose Questran added yesterday for supportive measures, with some improvement in stool frequency.  Continue oral vancomycin 250 mg QID, Flagyl 500 mg IV every 8 hours, and Questran for now. No evidence of worsening  clinical signs to include fever, leukocytosis, worsening renal function, or abdominal pain.   Plan: Continue Flagyl 500 mg IV every 8 hours Continue Vancomycin 250 mg QID Avoid acid suppression therapy Continue low-dose Questran (taken at 1300 daily, not to be taken with other medications and clearly outlined in [facility]) Low-residue diet Monitor for any changes in clinical status  [name], ANP-BC [facility]    LOS: 3 days    03/25/2016, 10:58 AM

## 2016-03-25 NOTE — Progress Notes (Addendum)
PROGRESS NOTE    Wayne Avila  DVV:[id number] DOB: 1990-07-06 DOA: 03/22/2016 PCP: [name], MD  Gastroenterologist: Dr. [name]   Brief Narrative:  Patient is a 25 y.o. male with PMH significant for ulcerative colitis-on Remicade, bipolar disorder, ADHD, and mental developmental delay. His was recently treated for C. difficile colitis in August 2017 with metronidazole. He presented to the ED on 03/18/16 for diarrhea. The CT scan revealed scattered colonic wall thickening most prominent in the sigmoid colon, consistent with colitis. C. difficile toxin was positive by PCR. The ED physician prescribed vancomycin on 03/18/2016.Marland Kitchen He was discharged home, but his symptoms persisted. He was admitted on 03/22/16 for further management of C. difficile colitis after incompleted outpatient therapy. GI was consulted. They added IV Flagyl and Questran. Avoiding acid suppression therapy. His stool frequency/amount is starting to decrease. Anticipate discharge in several days.    Assessment & Plan:   Principal Problem:   C. difficile colitis Active Problems:   Ulcerative colitis ([place])   Lower GI bleeding   Mental developmental delay   Oral thrush   Hypovolemia   Bipolar I disorder (HCC)   Acute blood loss anemia   Abnormal thyroid function test   1. C. difficile colitis in the setting of ulcerative colitis. Patient was started on vigorous IV fluids. Oral vancomycin was restarted, but the dose was increased to 250 mg 4 times daily. Starting steroids was contemplated, but it was held in the setting of an active C. difficile infection. H2 blocker was restarted. As needed IV hydromorphone and Zofran ordered for pain and nausea. C -GI was consulted. They agreed with medical management, but added IV Flagyl and Questran due to persistent bloody diarrhea. They discontinued Pepcid to avoid H2 suppression. -His diet was progressed to soft/low residue by GI. He is eating very little. Will hold on  probiotic for now. -Continue IV fluids, pain management, and supportive treatment.  Hematochezia/lower GI bleeding causing acute blood loss anemia. -His hemoglobin was 13.4 on admission. It has slowly drifted down to 11.2>>> 11.9. The decrease in his hemoglobin is both from hemodilution from IV fluids and hematochezia. We'll continue to monitor as there is no need for blood transfusion at this time.  Oral thrush. -Oral Diflucan was started. Day # 4 of 5 days. He may have gotten oral thrush from his steroid inhaler. The thrush has mostly resolved.  ADHD/bipolar disorder. Currently stable. He was continued on Strattera, Zoloft, and trazodone.  Sinus tachycardia. The patient was started on vigorous IV fluids for volume depletion.Marland Kitchen He is less tachycardic. We'll continue IV fluids with some decrease.  Low free T4. -His TSH was within normal limits, but his free T4 was low at 0.49. Recommend follow-up TSH and free T4 in 3-6 months as the patient may have sick euthyroid syndrome.   DVT prophylaxis: SCDs Code Status: Full code Family Communication: Discussed with mother Disposition Plan: Discharged home clinically appropriate   Consultants:   Gastroenterology  Procedures:   None  Antimicrobials:  IV Flagyl 03/23/16>>  Oral vancomycin 03/22/16>> 03/26/16   Subjective: Patient had approximately 6 loose and intermittently bloody stools overnight. He's had 2 this morning one of which was witnessed by the dictating physician. He says that he is actually feeling a little better.  Objective: Vitals:   03/24/16 2202 03/25/16 0505 03/25/16 0649 03/25/16 0830  BP: 126/78  123/67   Pulse: (!) 103  (!) 109   Resp: 20  20   Temp: 99 F (37.2 C)  98.8 F (37.1 C)   TempSrc: Oral  Oral   SpO2: 99%  98% 97%  Weight:  113.9 kg (251 lb 0.6 oz)    Height:        Intake/Output Summary (Last 24 hours) at 03/25/16 1119 Last data filed at 03/25/16 0257  Gross per 24 hour  Intake           2041.67 ml  Output                0 ml  Net          2041.67 ml   Filed Weights   03/23/16 0500 03/24/16 0522 03/25/16 0505  Weight: 109.9 kg (242 lb 4.8 oz) 113 kg (249 lb 3.2 oz) 113.9 kg (251 lb 0.6 oz)    Examination:  General exam: Appears calm and looks a little better this morning. Oropharynx: Near resolution of white tongue exudate.  Respiratory system: Clear to auscultation. Respiratory effort normal. Cardiovascular system: S1, S2, with mild tachycardia. No pedal edema. Gastrointestinal system: Abdomen is nondistended, soft and with mild diffuse tenderness. No organomegaly or masses felt. Normal bowel sounds heard. Stool: Witnessed in the toilet which is a small amount of reddish-brown liquidy stool. Central nervous system: Alert and oriented. No focal neurological deficits. Extremities: Symmetric 5 x 5 power. Skin: No rashes, lesions or ulcers Psychiatry: Judgement and insight appear normal. He has a flat affect.    Data Reviewed: I have personally reviewed following labs and imaging studies  CBC:  Recent Labs Lab 03/18/16 2223 03/19/16 1445 03/22/16 0804 03/23/16 0634 03/24/16 0642 03/25/16 0835  WBC 5.5 7.5 6.9 8.3 8.3 8.3  NEUTROABS 2.1  --  3.7  --   --   --   HGB 13.1 13.8 13.4 12.4* 11.2* 11.9*  HCT 38.8* 40.4 38.6* 36.7* 33.1* 35.2*  MCV 82.6 82.6 82.1 84.2 83.0 83.2  PLT 233 237 241 228 197 235   Basic Metabolic Panel:  Recent Labs Lab 03/18/16 2223 03/19/16 1445 03/22/16 0804 03/23/16 0634 03/24/16 0642  NA 135 138 135 136 137  K 3.7 4.2 3.7 3.8 3.6  CL 106 105 104 103 105  CO2 25 26 24 27 25   GLUCOSE 99 96 108* 117* 98  BUN 7 <5* <5* <5* <5*  CREATININE 0.95 0.94 0.91 0.90 0.75  CALCIUM 9.1 9.2 8.9 8.3* 8.2*   GFR: Estimated Creatinine Clearance: 175.9 mL/min (by C-G formula based on SCr of 0.75 mg/dL). Liver Function Tests:  Recent Labs Lab 03/18/16 2223 03/19/16 1445 03/22/16 0804 03/23/16 0634  AST 16 16 38 15  ALT 16*  14* 29 19  ALKPHOS 49 45 74 55  BILITOT 0.3 0.2* 0.4 0.2*  PROT 6.5 7.0 6.8 6.2*  ALBUMIN 3.5 3.7 3.3* 2.9*    Recent Labs Lab 03/18/16 2223 03/19/16 1445 03/22/16 0804  LIPASE 21 19 16    No results for input(s): AMMONIA in the last 168 hours. Coagulation Profile: No results for input(s): INR, PROTIME in the last 168 hours. Cardiac Enzymes: No results for input(s): CKTOTAL, CKMB, CKMBINDEX, TROPONINI in the last 168 hours. BNP (last 3 results) No results for input(s): PROBNP in the last 8760 hours. HbA1C: No results for input(s): HGBA1C in the last 72 hours. CBG:  Recent Labs Lab 03/22/16 1615  GLUCAP 109*   Lipid Profile: No results for input(s): CHOL, HDL, LDLCALC, TRIG, CHOLHDL, LDLDIRECT in the last 72 hours. Thyroid Function Tests:  Recent Labs  03/24/16 0642  TSH 2.909  FREET4  0.49*   Anemia Panel: No results for input(s): VITAMINB12, FOLATE, FERRITIN, TIBC, IRON, RETICCTPCT in the last 72 hours. Sepsis Labs: No results for input(s): PROCALCITON, LATICACIDVEN in the last 168 hours.  No results found for this or any previous visit (from the past 240 hour(s)).       Radiology Studies: No results found.      Scheduled Meds: . atomoxetine  120 mg Oral Daily  . cholestyramine  2 g Oral Daily  . divalproex  500 mg Oral BID  . feeding supplement  1 Container Oral TID BM  . fluconazole  100 mg Oral Daily  . fluticasone furoate-vilanterol  1 puff Inhalation Daily  . loratadine  10 mg Oral QHS  . metronidazole  500 mg Intravenous Q8H  . montelukast  10 mg Oral QHS  . sertraline  100 mg Oral Daily  . traZODone  100 mg Oral QHS  . vancomycin  250 mg Oral Q6H   Continuous Infusions: . 0.9 % NaCl with KCl 20 mEq / L 100 mL/hr at 03/25/16 0257     LOS: 3 days    Time spent: 52 minutes    Rexene Alberts, MD [facility] Pager [phone]  If 7PM-7AM, please contact night-coverage [email or web address] Password [email or web address] 03/25/2016, 11:19 AM

## 2016-03-26 ENCOUNTER — Ambulatory Visit: Payer: Self-pay | Admitting: Gastroenterology

## 2016-03-26 DIAGNOSIS — R946 Abnormal results of thyroid function studies: Secondary | ICD-10-CM

## 2016-03-26 DIAGNOSIS — K51818 Other ulcerative colitis with other complication: Secondary | ICD-10-CM

## 2016-03-26 LAB — BASIC METABOLIC PANEL
Anion gap: 6 (ref 5–15)
BUN: <5 mg/dL — ABNORMAL LOW (ref 6–20)
CO2: 26 mmol/L (ref 22–32)
Calcium: 8.3 mg/dL — ABNORMAL LOW (ref 8.9–10.3)
Chloride: 105 mmol/L (ref 101–111)
Creatinine, Ser: 0.83 mg/dL (ref 0.61–1.24)
GFR calc Af Amer: >60 mL/min (ref >60)
GFR calc non Af Amer: >60 mL/min (ref >60)
Glucose, Bld: 98 mg/dL (ref 65–99)
Potassium: 3.5 mmol/L (ref 3.5–5.1)
Sodium: 137 mmol/L (ref 135–145)

## 2016-03-26 LAB — CBC
HCT: 33.2 % — ABNORMAL LOW (ref 39.0–52.0)
Hemoglobin: 11.2 g/dL — ABNORMAL LOW (ref 13.0–17.0)
MCH: 28.1 pg (ref 26.0–34.0)
MCHC: 33.7 g/dL (ref 30.0–36.0)
MCV: 83.4 fL (ref 78.0–100.0)
Platelets: 245 10*3/uL (ref 150–400)
RBC: 3.98 MIL/uL — ABNORMAL LOW (ref 4.22–5.81)
RDW: 14.9 % (ref 11.5–15.5)
WBC: 7.8 10*3/uL (ref 4.0–10.5)

## 2016-03-26 MED ORDER — CHOLESTYRAMINE 4 G PO PACK
2.0000 g | PACK | Freq: Every day | ORAL | Status: DC
  Administered 2016-03-27: 2 g via ORAL
  Filled 2016-03-26 (×2): qty 1

## 2016-03-26 NOTE — Progress Notes (Signed)
PROGRESS NOTE    Wayne Avila  [id number]:[id number] DOB: 1991/03/20 DOA: 03/22/2016 PCP: [name], MD    Brief Narrative:  33 yom with a hx of ulcerative colitis on Remicade, bipolar disorder, ADHD, and mental developmental delay who was recently treated with C. diff 8/17 presented with complaints of diarrhea. His recent CT scan revealed scattered colonic wall thickening most prominent in the sigmoid colon, consistent with colitis and his C. difficile toxin was positive by PCR. He was discharged home with vancomycin. GI has been consulted and he has been started on flagyl and Questran. Admitted for further management of C. difficile colitis.   Assessment & Plan:   Principal Problem:   C. difficile colitis Active Problems:   Mental developmental delay   Oral thrush   Ulcerative colitis ([place])   Hypovolemia   Lower GI bleeding   Bipolar I disorder (HCC)   Acute blood loss anemia   Abnormal thyroid function test  1. Clostridium Difficile Colitis. Patient has been started on vigorous IV fluids, and vancomycin. GI was consulted and recommended flagyl and Questran due to persistent bloody diarrhea. He continues to have frequent bowel movements. Mother reports that stools are still frequent, but appear to be smaller in volume. We'll continue IV fluids, pain management, and antibiotics.  2. Hematochezia/ lower GI bleed causing acute blood loss anemia. Likely related to colitis. Hemoglobin is 11.2. There is no need for blood transfusion at this time. Will continue to monitor.  3. Oral thrush. I suspect he obtained this thrush from his steroid inhaler. Will continue oral Diflucan.  4. ADHD/Bipolar disorder. Stable. Continue Strattera, Zoloft, and trazodone.  5. Sinus tachycardia. Improving. Will continue IV fluids.  6. Low free T4. TSH is within normal limits although his free T4 is low at 0.49. Recommend follow-up TSH and free T4 in 3-6 months as the patient may have sick euthyroid syndrome.    DVT prophylaxis: SCDs  Code Status: Full  Family Communication: Discussed with mother at bedside Disposition Plan: Discharge home once improved.    Consultants:   Gastroenterology   Procedures:   None   Antimicrobials:   Flagyl 10/15 >>  Oral vancomycin 10/14 >> 10/18    Subjective: Continues to have lower abdominal pain. Recently received pain medications  Objective: Vitals:   03/25/16 2156 03/26/16 0539 03/26/16 0641 03/26/16 0745  BP: 122/76 129/71    Pulse: (!) 108 93    Resp: 18 18    Temp: 98.9 F (37.2 C) 99.3 F (37.4 C)    TempSrc: Oral Oral    SpO2: 95% 98%  98%  Weight:   113.7 kg (250 lb 11.7 oz)   Height:       No intake or output data in the 24 hours ending 03/26/16 0924 Filed Weights   03/24/16 0522 03/25/16 0505 03/26/16 0641  Weight: 113 kg (249 lb 3.2 oz) 113.9 kg (251 lb 0.6 oz) 113.7 kg (250 lb 11.7 oz)    Examination:  General exam: Appears somnolent  Respiratory system: Clear to auscultation. Respiratory effort normal. Cardiovascular system: S1 & S2 heard, RRR. No JVD, murmurs, rubs, gallops or clicks. No pedal edema. Gastrointestinal system: Abdomen is nondistended, soft and mildly tender in lower abdomen. No organomegaly or masses felt. Normal bowel sounds heard. Central nervous system: somnolent. No focal neurological deficits. Extremities: Symmetric 5 x 5 power. Skin: No rashes, lesions or ulcers Psychiatry: somnolent    Data Reviewed: I have personally reviewed following labs and imaging studies  CBC:  Recent Labs Lab 03/22/16 0804 03/23/16 0634 03/24/16 0642 03/25/16 0835 03/26/16 0632  WBC 6.9 8.3 8.3 8.3 7.8  NEUTROABS 3.7  --   --   --   --   HGB 13.4 12.4* 11.2* 11.9* 11.2*  HCT 38.6* 36.7* 33.1* 35.2* 33.2*  MCV 82.1 84.2 83.0 83.2 83.4  PLT 241 228 197 219 062   Basic Metabolic Panel:  Recent Labs Lab 03/19/16 1445 03/22/16 0804 03/23/16 0634 03/24/16 0642 03/26/16 0632  NA 138 135 136 137 137   K 4.2 3.7 3.8 3.6 3.5  CL 105 104 103 105 105  CO2 26 24 27 25 26   GLUCOSE 96 108* 117* 98 98  BUN <5* <5* <5* <5* <5*  CREATININE 0.94 0.91 0.90 0.75 0.83  CALCIUM 9.2 8.9 8.3* 8.2* 8.3*   GFR: Estimated Creatinine Clearance: 169.4 mL/min (by C-G formula based on SCr of 0.83 mg/dL). Liver Function Tests:  Recent Labs Lab 03/19/16 1445 03/22/16 0804 03/23/16 0634  AST 16 38 15  ALT 14* 29 19  ALKPHOS 45 74 55  BILITOT 0.2* 0.4 0.2*  PROT 7.0 6.8 6.2*  ALBUMIN 3.7 3.3* 2.9*    Recent Labs Lab 03/19/16 1445 03/22/16 0804  LIPASE 19 16   No results for input(s): AMMONIA in the last 168 hours. Coagulation Profile: No results for input(s): INR, PROTIME in the last 168 hours. Cardiac Enzymes: No results for input(s): CKTOTAL, CKMB, CKMBINDEX, TROPONINI in the last 168 hours. BNP (last 3 results) No results for input(s): PROBNP in the last 8760 hours. HbA1C: No results for input(s): HGBA1C in the last 72 hours. CBG:  Recent Labs Lab 03/22/16 1615  GLUCAP 109*   Lipid Profile: No results for input(s): CHOL, HDL, LDLCALC, TRIG, CHOLHDL, LDLDIRECT in the last 72 hours. Thyroid Function Tests:  Recent Labs  03/24/16 0642  TSH 2.909  FREET4 0.49*   Anemia Panel: No results for input(s): VITAMINB12, FOLATE, FERRITIN, TIBC, IRON, RETICCTPCT in the last 72 hours. Sepsis Labs: No results for input(s): PROCALCITON, LATICACIDVEN in the last 168 hours.  No results found for this or any previous visit (from the past 240 hour(s)).       Radiology Studies: No results found.      Scheduled Meds: . atomoxetine  120 mg Oral Daily  . cholestyramine  2 g Oral Daily  . divalproex  500 mg Oral BID  . feeding supplement  1 Container Oral TID BM  . fluticasone furoate-vilanterol  1 puff Inhalation Daily  . loratadine  10 mg Oral QHS  . metronidazole  500 mg Intravenous Q8H  . montelukast  10 mg Oral QHS  . sertraline  100 mg Oral Daily  . traZODone  100 mg Oral  QHS  . vancomycin  250 mg Oral Q6H   Continuous Infusions: . 0.9 % NaCl with KCl 20 mEq / L 70 mL/hr at 03/25/16 1745     LOS: 4 days    Time spent: 25 minutes    Kathie Dike, MD [facility] If 7PM-7AM, please contact night-coverage [email or web address] Password [email or web address] 03/26/2016, 9:24 AM

## 2016-03-26 NOTE — Progress Notes (Signed)
Subjective:  Patient complains of lower abdominal pain. Eating a little more. Mom states he is passing a lot of blood. She reports 8 episodes since midnight.   Objective: Vital signs in last 24 hours: Temp:  [98.7 F (37.1 C)-99.3 F (37.4 C)] 99.3 F (37.4 C) (10/18 0539) Pulse Rate:  [93-108] 93 (10/18 0539) Resp:  [18] 18 (10/18 0539) BP: (122-129)/(71-78) 129/71 (10/18 0539) SpO2:  [95 %-98 %] 98 % (10/18 0745) Weight:  [250 lb 11.7 oz (113.7 kg)] 250 lb 11.7 oz (113.7 kg) (10/18 0641) Last BM Date: 03/25/16 General:   Alert,  NAD. More talkative today. Head:  Normocephalic and atraumatic. Eyes:  Sclera clear, no icterus.  Abdomen:  Soft, moderate lower abd tenderness, nondistended. Normal bowel sounds, without guarding, and without rebound.   Extremities:  Without clubbing, deformity or edema. Neurologic:  Alert and  oriented x4;  grossly normal neurologically. Skin:  Intact without significant lesions or rashes. Psych:  Alert and cooperative. Normal mood and affect.  Intake/Output from previous day: No intake/output data recorded. Intake/Output this shift: No intake/output data recorded.  Lab Results: CBC  Recent Labs  03/24/16 0642 03/25/16 0835 03/26/16 0632  WBC 8.3 8.3 7.8  HGB 11.2* 11.9* 11.2*  HCT 33.1* 35.2* 33.2*  MCV 83.0 83.2 83.4  PLT 197 219 245   BMET  Recent Labs  03/24/16 0642 03/26/16 0632  NA 137 137  K 3.6 3.5  CL 105 105  CO2 25 26  GLUCOSE 98 98  BUN <5* <5*  CREATININE 0.75 0.83  CALCIUM 8.2* 8.3*   LFTs No results for input(s): BILITOT, BILIDIR, IBILI, ALKPHOS, AST, ALT, PROT, ALBUMIN in the last 72 hours. No results for input(s): LIPASE in the last 72 hours. PT/INR No results for input(s): LABPROT, INR in the last 72 hours.    Imaging Studies: Dg Chest 2 View  Result Date: 03/01/2016 CLINICAL DATA:  Centralized chest pain and right upper quadrant pain that began 2 hours ago. EXAM: CHEST  2 VIEW COMPARISON:  February 18, 2016 FINDINGS: The heart size and mediastinal contours are within normal limits. Both lungs are clear. The visualized skeletal structures are unremarkable. IMPRESSION: No active cardiopulmonary disease. Electronically Signed   By: Dorise Bullion III M.D   On: 03/01/2016 18:09   Ct Abdomen Pelvis W Contrast  Result Date: 03/18/2016 CLINICAL DATA:  Abdominal pain. Recent diagnosis of C diff. Patient reports diarrhea. EXAM: CT ABDOMEN AND PELVIS WITH CONTRAST TECHNIQUE: Multidetector CT imaging of the abdomen and pelvis was performed using the standard protocol following bolus administration of intravenous contrast. CONTRAST:  126m ISOVUE-300 IOPAMIDOL (ISOVUE-300) INJECTION 61% COMPARISON:  CT 01/27/2016, 11/21/2015, additional priors reviewed FINDINGS: Lower chest: Lung bases are clear. Hepatobiliary: No focal liver abnormality is seen. No gallstones, gallbladder wall thickening, or biliary dilatation. Pancreas: Unremarkable. No pancreatic ductal dilatation or surrounding inflammatory changes. Spleen: Normal in size without focal abnormality. Adrenals/Urinary Tract: Adrenal glands are unremarkable. Kidneys are normal, without renal calculi, focal lesion, or hydronephrosis. Bladder is unremarkable. Stomach/Bowel: Scattered colonic wall thickening involving the sigmoid colon, splenic flexure, and descending colon. No small bowel dilatation. Stomach is physiologically distended. Appendix is normal. Vascular/Lymphatic: No significant vascular findings are present. No enlarged abdominal or pelvic lymph nodes. Reproductive: Prostate is unremarkable. Other: No abdominal wall hernia or abnormality. No abdominopelvic ascites. Musculoskeletal: There are no acute or suspicious osseous abnormalities. IMPRESSION: Scattered colonic wall thickening most prominent in the sigmoid colon, consistent with colitis. Electronically Signed   By:  Jeb Levering M.D.   On: 03/18/2016 06:09   Dg Abd 2 Views  Result Date:  03/01/2016 CLINICAL DATA:  Acute onset of central chest pain and right upper quadrant abdominal pain. Initial encounter. EXAM: ABDOMEN - 2 VIEW COMPARISON:  Abdominal radiograph performed 02/18/2016 FINDINGS: The visualized bowel gas pattern is unremarkable. Scattered air and stool filled loops of colon are seen; no abnormal dilatation of small bowel loops is seen to suggest small bowel obstruction. No free intra-abdominal air is identified on the provided upright view. The visualized osseous structures are within normal limits; the sacroiliac joints are unremarkable in appearance. IMPRESSION: Unremarkable bowel gas pattern; no free intra-abdominal air seen. Small to moderate amount of stool noted in the colon. Electronically Signed   By: Garald Balding M.D.   On: 03/01/2016 18:31  [2 weeks]   Assessment: 25 year old male with history of ulcerative colitis, presenting with recurrent Cdiff. Prior treatment of Cdiff with Flagyl in Aug 2017. Off-label low dose Questran added for supportive measures, with some improvement in stool frequency initially but mothers states last night was worse and now with increased bleeding. Continue oral vancomycin 250 mg QID, Flagyl 500 mg IV every 8 hours, and Questran for now. Noted that Questran and vancomycin were given TOGETHER today, discussed with nursing.    Only one stool documented since midnight, no blood reported. Mother states they call for stool to be looked at but flush if staff doesn't come.   Patient continues on frequent Dilaudid use.   Plan: 1. Change Questran to be given at 1400 daily. 2. Continue Flagyl, Vancomycin. 3. Continue supportive measures.   Laureen Ochs. Bernarda Caffey [facility] [phone] 10/18/20171:10 PM     LOS: 4 days

## 2016-03-27 ENCOUNTER — Telehealth: Payer: Self-pay | Admitting: Gastroenterology

## 2016-03-27 DIAGNOSIS — B370 Candidal stomatitis: Secondary | ICD-10-CM

## 2016-03-27 LAB — CBC
HCT: 33.6 % — ABNORMAL LOW (ref 39.0–52.0)
Hemoglobin: 11.2 g/dL — ABNORMAL LOW (ref 13.0–17.0)
MCH: 27.7 pg (ref 26.0–34.0)
MCHC: 33.3 g/dL (ref 30.0–36.0)
MCV: 83 fL (ref 78.0–100.0)
Platelets: 240 10*3/uL (ref 150–400)
RBC: 4.05 MIL/uL — ABNORMAL LOW (ref 4.22–5.81)
RDW: 15.4 % (ref 11.5–15.5)
WBC: 8.1 10*3/uL (ref 4.0–10.5)

## 2016-03-27 LAB — BASIC METABOLIC PANEL
Anion gap: 6 (ref 5–15)
BUN: <5 mg/dL — ABNORMAL LOW (ref 6–20)
CO2: 26 mmol/L (ref 22–32)
Calcium: 8.2 mg/dL — ABNORMAL LOW (ref 8.9–10.3)
Chloride: 103 mmol/L (ref 101–111)
Creatinine, Ser: 0.77 mg/dL (ref 0.61–1.24)
GFR calc Af Amer: >60 mL/min (ref >60)
GFR calc non Af Amer: >60 mL/min (ref >60)
Glucose, Bld: 109 mg/dL — ABNORMAL HIGH (ref 65–99)
Potassium: 3.6 mmol/L (ref 3.5–5.1)
Sodium: 135 mmol/L (ref 135–145)

## 2016-03-27 NOTE — Progress Notes (Signed)
PROGRESS NOTE    Wayne HALBERT  UXN:[id number] DOB: Feb 19, 1991 DOA: 03/22/2016 PCP: [name], MD    Brief Narrative:  78 yom with a hx of ulcerative colitis on Remicade, bipolar disorder, ADHD, and mental developmental delay who was recently treated with C. diff 8/17 presented with complaints of diarrhea. His recent CT scan revealed scattered colonic wall thickening most prominent in the sigmoid colon, consistent with colitis and his C. difficile toxin was positive by PCR. He was discharged home with vancomycin. GI has been consulted and he has been started on flagyl and Questran. Admitted for further management of C. difficile colitis.   Assessment & Plan:   Principal Problem:   C. difficile colitis Active Problems:   Mental developmental delay   Oral thrush   Ulcerative colitis ([place])   Hypovolemia   Lower GI bleeding   Bipolar I disorder (HCC)   Acute blood loss anemia   Abnormal thyroid function test  1. Clostridium Difficile Colitis. Patient has been on vigorous IV fluids, and oral vancomycin. GI was consulted and recommended IV flagyl and Questran due to persistent bloody diarrhea. Overall, his mother reports that diarrhea is improving and is less frequent. He is having nausea and vomiting today. Will continue antiemetics. Agree with discontinuing flagyl, since this may be contributing to nausea.  2. Hematochezia/ lower GI bleed causing acute blood loss anemia. Likely related to colitis. Hemoglobin is 11.2. There is no need for blood transfusion at this time. Will continue to monitor.  3. Oral thrush. I suspect he obtained this thrush from his steroid inhaler. Will continue oral Diflucan.  4. ADHD/Bipolar disorder. Stable. Continue Strattera, Zoloft, and trazodone.  5. Sinus tachycardia. Improving. Will continue IV fluids. 6. Low free T4. TSH is within normal limits although his free T4 is low at 0.49. Recommend follow-up TSH and free T4 in 3-6 months as the patient may have  sick euthyroid syndrome.   DVT prophylaxis: SCDs  Code Status: Full  Family Communication: Discussed with mother at bedside Disposition Plan: Discharge home once improved.    Consultants:   Gastroenterology   Procedures:   None   Antimicrobials:   Flagyl 10/15 >>10/19  Oral vancomycin 10/14 >>    Subjective: Had nausea and vomiting this morning after breakfast. Mother reports diarrhea is improving  Objective: Vitals:   03/26/16 0641 03/26/16 0745 03/26/16 1544 03/27/16 0511  BP:   134/80 122/74  Pulse:   94 86  Resp:   18 15  Temp:   98.8 F (37.1 C) 99 F (37.2 C)  TempSrc:   Oral Oral  SpO2:  98% 96% 97%  Weight: 113.7 kg (250 lb 11.7 oz)   106.5 kg (234 lb 12.8 oz)  Height:        Intake/Output Summary (Last 24 hours) at 03/27/16 0747 Last data filed at 03/27/16 0700  Gross per 24 hour  Intake             2020 ml  Output                0 ml  Net             2020 ml   Filed Weights   03/25/16 0505 03/26/16 0641 03/27/16 0511  Weight: 113.9 kg (251 lb 0.6 oz) 113.7 kg (250 lb 11.7 oz) 106.5 kg (234 lb 12.8 oz)    Examination:  General exam: Appears calm and comfortable  Respiratory system: Clear to auscultation. Respiratory effort normal. Cardiovascular  system: S1 & S2 heard, RRR. No JVD, murmurs, rubs, gallops or clicks. No pedal edema. Gastrointestinal system: Abdomen is nondistended, soft and nontender. No organomegaly or masses felt. Normal bowel sounds heard. Central nervous system: Alert and oriented. No focal neurological deficits. Extremities: Symmetric 5 x 5 power. Skin: No rashes, lesions or ulcers Psychiatry: Judgement and insight appear normal. Mood & affect appropriate.     Data Reviewed: I have personally reviewed following labs and imaging studies  CBC:  Recent Labs Lab 03/22/16 0804 03/23/16 0634 03/24/16 0642 03/25/16 0835 03/26/16 0632  WBC 6.9 8.3 8.3 8.3 7.8  NEUTROABS 3.7  --   --   --   --   HGB 13.4 12.4* 11.2*  11.9* 11.2*  HCT 38.6* 36.7* 33.1* 35.2* 33.2*  MCV 82.1 84.2 83.0 83.2 83.4  PLT 241 228 197 219 993   Basic Metabolic Panel:  Recent Labs Lab 03/22/16 0804 03/23/16 0634 03/24/16 0642 03/26/16 0632  NA 135 136 137 137  K 3.7 3.8 3.6 3.5  CL 104 103 105 105  CO2 24 27 25 26   GLUCOSE 108* 117* 98 98  BUN <5* <5* <5* <5*  CREATININE 0.91 0.90 0.75 0.83  CALCIUM 8.9 8.3* 8.2* 8.3*   GFR: Estimated Creatinine Clearance: 163.7 mL/min (by C-G formula based on SCr of 0.83 mg/dL). Liver Function Tests:  Recent Labs Lab 03/22/16 0804 03/23/16 0634  AST 38 15  ALT 29 19  ALKPHOS 74 55  BILITOT 0.4 0.2*  PROT 6.8 6.2*  ALBUMIN 3.3* 2.9*    Recent Labs Lab 03/22/16 0804  LIPASE 16   No results for input(s): AMMONIA in the last 168 hours. Coagulation Profile: No results for input(s): INR, PROTIME in the last 168 hours. Cardiac Enzymes: No results for input(s): CKTOTAL, CKMB, CKMBINDEX, TROPONINI in the last 168 hours. BNP (last 3 results) No results for input(s): PROBNP in the last 8760 hours. HbA1C: No results for input(s): HGBA1C in the last 72 hours. CBG:  Recent Labs Lab 03/22/16 1615  GLUCAP 109*   Lipid Profile: No results for input(s): CHOL, HDL, LDLCALC, TRIG, CHOLHDL, LDLDIRECT in the last 72 hours. Thyroid Function Tests: No results for input(s): TSH, T4TOTAL, FREET4, T3FREE, THYROIDAB in the last 72 hours. Anemia Panel: No results for input(s): VITAMINB12, FOLATE, FERRITIN, TIBC, IRON, RETICCTPCT in the last 72 hours. Sepsis Labs: No results for input(s): PROCALCITON, LATICACIDVEN in the last 168 hours.  No results found for this or any previous visit (from the past 240 hour(s)).       Radiology Studies: No results found.      Scheduled Meds: . atomoxetine  120 mg Oral Daily  . cholestyramine  2 g Oral Daily  . divalproex  500 mg Oral BID  . feeding supplement  1 Container Oral TID BM  . fluticasone furoate-vilanterol  1 puff  Inhalation Daily  . loratadine  10 mg Oral QHS  . metronidazole  500 mg Intravenous Q8H  . montelukast  10 mg Oral QHS  . sertraline  100 mg Oral Daily  . traZODone  100 mg Oral QHS  . vancomycin  250 mg Oral Q6H   Continuous Infusions: . 0.9 % NaCl with KCl 20 mEq / L 70 mL/hr at 03/27/16 0014     LOS: 5 days    Time spent: 25 minutes     Kathie Dike, MD [facility] If 7PM-7AM, please contact night-coverage [email or web address] Password [email or web address] 03/27/2016, 7:47 AM

## 2016-03-27 NOTE — Progress Notes (Signed)
Subjective:  Patient states he's feeling better. Minimal rectal bleeding. His abdominal pain is better. Stools less frequent and more consistency "look like mud". He's tired of being in the hospital.   Objective: Vital signs in last 24 hours: Temp:  [98.8 F (37.1 C)-99 F (37.2 C)] 99 F (37.2 C) (10/19 0511) Pulse Rate:  [86-94] 86 (10/19 0511) Resp:  [15-18] 15 (10/19 0511) BP: (122-134)/(74-80) 122/74 (10/19 0511) SpO2:  [96 %-97 %] 97 % (10/19 0511) Weight:  [234 lb 12.8 oz (106.5 kg)] 234 lb 12.8 oz (106.5 kg) (10/19 0511) Last BM Date: 03/26/16 General:   Alert,  Well-developed, well-nourished, pleasant and cooperative in NAD Head:  Normocephalic and atraumatic. Eyes:  Sclera clear, no icterus.  Abdomen:  Soft, nontender and nondistended.  Normal bowel sounds, without guarding, and without rebound.   Extremities:  Without clubbing, deformity or edema. Neurologic:  Alert and  oriented x4;  grossly normal neurologically. Skin:  Intact without significant lesions or rashes. Psych:  Alert and cooperative. Normal mood and affect.  Intake/Output from previous day: 10/18 0701 - 10/19 0700 In: 2020 [P.O.:720; I.V.:700; IV Piggyback:600] Out: -  Intake/Output this shift: No intake/output data recorded.  Lab Results: CBC  Recent Labs  03/25/16 0835 03/26/16 0632 03/27/16 0607  WBC 8.3 7.8 8.1  HGB 11.9* 11.2* 11.2*  HCT 35.2* 33.2* 33.6*  MCV 83.2 83.4 83.0  PLT 219 245 240   BMET  Recent Labs  03/26/16 0632 03/27/16 0607  NA 137 135  K 3.5 3.6  CL 105 103  CO2 26 26  GLUCOSE 98 109*  BUN <5* <5*  CREATININE 0.83 0.77  CALCIUM 8.3* 8.2*   LFTs No results for input(s): BILITOT, BILIDIR, IBILI, ALKPHOS, AST, ALT, PROT, ALBUMIN in the last 72 hours. No results for input(s): LIPASE in the last 72 hours. PT/INR No results for input(s): LABPROT, INR in the last 72 hours.    Imaging Studies: Dg Chest 2 View  Result Date: 03/01/2016 CLINICAL DATA:   Centralized chest pain and right upper quadrant pain that began 2 hours ago. EXAM: CHEST  2 VIEW COMPARISON:  February 18, 2016 FINDINGS: The heart size and mediastinal contours are within normal limits. Both lungs are clear. The visualized skeletal structures are unremarkable. IMPRESSION: No active cardiopulmonary disease. Electronically Signed   By: Dorise Bullion III M.D   On: 03/01/2016 18:09   Ct Abdomen Pelvis W Contrast  Result Date: 03/18/2016 CLINICAL DATA:  Abdominal pain. Recent diagnosis of C diff. Patient reports diarrhea. EXAM: CT ABDOMEN AND PELVIS WITH CONTRAST TECHNIQUE: Multidetector CT imaging of the abdomen and pelvis was performed using the standard protocol following bolus administration of intravenous contrast. CONTRAST:  156m ISOVUE-300 IOPAMIDOL (ISOVUE-300) INJECTION 61% COMPARISON:  CT 01/27/2016, 11/21/2015, additional priors reviewed FINDINGS: Lower chest: Lung bases are clear. Hepatobiliary: No focal liver abnormality is seen. No gallstones, gallbladder wall thickening, or biliary dilatation. Pancreas: Unremarkable. No pancreatic ductal dilatation or surrounding inflammatory changes. Spleen: Normal in size without focal abnormality. Adrenals/Urinary Tract: Adrenal glands are unremarkable. Kidneys are normal, without renal calculi, focal lesion, or hydronephrosis. Bladder is unremarkable. Stomach/Bowel: Scattered colonic wall thickening involving the sigmoid colon, splenic flexure, and descending colon. No small bowel dilatation. Stomach is physiologically distended. Appendix is normal. Vascular/Lymphatic: No significant vascular findings are present. No enlarged abdominal or pelvic lymph nodes. Reproductive: Prostate is unremarkable. Other: No abdominal wall hernia or abnormality. No abdominopelvic ascites. Musculoskeletal: There are no acute or suspicious osseous abnormalities. IMPRESSION: Scattered colonic  wall thickening most prominent in the sigmoid colon, consistent with  colitis. Electronically Signed   By: Jeb Levering M.D.   On: 03/18/2016 06:09   Dg Abd 2 Views  Result Date: 03/01/2016 CLINICAL DATA:  Acute onset of central chest pain and right upper quadrant abdominal pain. Initial encounter. EXAM: ABDOMEN - 2 VIEW COMPARISON:  Abdominal radiograph performed 02/18/2016 FINDINGS: The visualized bowel gas pattern is unremarkable. Scattered air and stool filled loops of colon are seen; no abnormal dilatation of small bowel loops is seen to suggest small bowel obstruction. No free intra-abdominal air is identified on the provided upright view. The visualized osseous structures are within normal limits; the sacroiliac joints are unremarkable in appearance. IMPRESSION: Unremarkable bowel gas pattern; no free intra-abdominal air seen. Small to moderate amount of stool noted in the colon. Electronically Signed   By: Garald Balding M.D.   On: 03/01/2016 18:31  [2 weeks]   Assessment: 25 year old male with history of ulcerative colitis, presenting with recurrent Cdiff. Prior treatment of Cdiff with Flagyl in Aug 2017. Off-label low dose Questran added for supportive measures, with some improvement in stool frequency initially but mothers states last night was worse and now with increased bleeding. Continue oral vancomycin 250 mg QID, Flagyl 500 mg IV every 8 hours, and Questran for now. Noted that Questran and vancomycin were given TOGETHER today, discussed with nursing.    Stool consistency improved and less frequent per mother and patient. One document stool in the last 24 hours.  Patient continues on frequent Dilaudid use.   Plan: 1. Plan for Vancomycin taper at two weeks of 22m QID. Then Vanc 2538mBID for 7 days, QD for 7 days, QOD for 7 days, then every 3 days for 14 days, then stop.  2. Would D/C questran AT discharge.  3. Consider D/C IV Flagyl.  4. Patient has appointment with Dr. FiOneida Alarn 04/09/16. 5. Will follow peripherally.   LeLaureen OchsLeBernarda CaffeyoAmbulatory Surgical [facility] Of [facility] [phone] AM     LOS: 5 days

## 2016-03-27 NOTE — Plan of Care (Signed)
Problem: Bowel/Gastric: Goal: Will not experience complications related to bowel motility Outcome: Not Progressing Pt still having diarrhea

## 2016-03-27 NOTE — Telephone Encounter (Signed)
REFER TO [facility] FOR 2ND OPINION REGARDING IBD TREATMENT AND/OR FECAL TRANSPLANT, Dx: ULCERATIVE COLITIS, SEVERE, C DIFF COLITIS x 2. [place] 20. FAILED TO RESPOND TO MESALAMINE.

## 2016-03-28 ENCOUNTER — Other Ambulatory Visit: Payer: Self-pay

## 2016-03-28 DIAGNOSIS — K51919 Ulcerative colitis, unspecified with unspecified complications: Secondary | ICD-10-CM

## 2016-03-28 DIAGNOSIS — K529 Noninfective gastroenteritis and colitis, unspecified: Secondary | ICD-10-CM

## 2016-03-28 DIAGNOSIS — D62 Acute posthemorrhagic anemia: Secondary | ICD-10-CM

## 2016-03-28 DIAGNOSIS — K922 Gastrointestinal hemorrhage, unspecified: Secondary | ICD-10-CM

## 2016-03-28 DIAGNOSIS — A0472 Enterocolitis due to Clostridium difficile, not specified as recurrent: Secondary | ICD-10-CM

## 2016-03-28 DIAGNOSIS — F319 Bipolar disorder, unspecified: Secondary | ICD-10-CM

## 2016-03-28 DIAGNOSIS — F819 Developmental disorder of scholastic skills, unspecified: Secondary | ICD-10-CM

## 2016-03-28 MED ORDER — ONDANSETRON HCL 4 MG PO TABS
4.0000 mg | ORAL_TABLET | Freq: Four times a day (QID) | ORAL | Status: DC
  Administered 2016-03-28 – 2016-03-31 (×11): 4 mg via ORAL
  Filled 2016-03-28 (×13): qty 1

## 2016-03-28 MED ORDER — ONDANSETRON HCL 4 MG/2ML IJ SOLN
4.0000 mg | Freq: Four times a day (QID) | INTRAMUSCULAR | Status: DC
  Administered 2016-03-28: 4 mg via INTRAVENOUS
  Filled 2016-03-28: qty 2

## 2016-03-28 MED ORDER — HYDROMORPHONE HCL 2 MG PO TABS
2.0000 mg | ORAL_TABLET | Freq: Four times a day (QID) | ORAL | Status: DC | PRN
  Administered 2016-03-28 – 2016-03-30 (×5): 2 mg via ORAL
  Filled 2016-03-28 (×5): qty 1

## 2016-03-28 MED ORDER — HYDROMORPHONE HCL 1 MG/ML IJ SOLN: 1.0000 mg | Freq: Four times a day (QID) | INTRAMUSCULAR | Status: DC | PRN

## 2016-03-28 MED ORDER — OXYCODONE-ACETAMINOPHEN 5-325 MG PO TABS
1.0000 | ORAL_TABLET | Freq: Once | ORAL | Status: AC
  Administered 2016-03-28: 1 via ORAL
  Filled 2016-03-28: qty 1

## 2016-03-28 MED ORDER — PROMETHAZINE HCL 25 MG/ML IJ SOLN: 12.5000 mg | Freq: Three times a day (TID) | INTRAMUSCULAR | Status: DC | PRN

## 2016-03-28 NOTE — Progress Notes (Signed)
Notified Dr. Roderic Palau of the patients complaint of abdominal pain.  The patient is diaphoretic and rates pain as unbearable that he can not move.  New orders given and followed.

## 2016-03-28 NOTE — Telephone Encounter (Signed)
Called [facility] for referral and set-up appt for 04/28/16 at 2:30. [facility] to mail pt a packet. Info also faxed. Called and informed his mother.

## 2016-03-28 NOTE — Progress Notes (Signed)
Subjective: Patient sleeping in the bed, mother at bedside. Persistent nausea, no vomiting in 1-2 days. Began having diarrhea last night around 3:30 and has had about 7 stools since with minimal heme noted. Some abdominal discomfort this mooring as well. No other upper or lower GI symptoms.  Objective: Vital signs in last 24 hours: Temp:  [98.1 F (36.7 C)-99.2 F (37.3 C)] 99.2 F (37.3 C) (10/20 0524) Pulse Rate:  [77-96] 96 (10/20 0524) Resp:  [15-17] 15 (10/20 0524) BP: (104-140)/(50-82) 104/53 (10/20 0524) SpO2:  [91 %-100 %] 91 % (10/20 0722) Weight:  [233 lb 11.2 oz (106 kg)] 233 lb 11.2 oz (106 kg) (10/20 0524) Last BM Date: 03/27/16 General:   Alert and oriented, pleasant Eyes:  No icterus, sclera clear. Conjuctiva pink.  Heart:  S1, S2 present, no murmurs noted.  Lungs: Clear to auscultation bilaterally, without wheezing, rales, or rhonchi.  Abdomen:  Bowel sounds present, soft, non-distended. Mild TTP. No HSM or hernias noted. No rebound or guarding. No masses appreciated  Msk:  Symmetrical without gross deformities. Pulses:  Normal bilateral DP pulses noted. Extremities:  Without clubbing or edema. Neurologic:  Alert and  oriented x4;  grossly normal neurologically. Psych:  Alert and cooperative. Normal mood and affect. Sleepy but awakens to voice and answers questions.  Intake/Output from previous day: 10/19 0701 - 10/20 0700 In: 2328.8 [P.O.:480; I.V.:1848.8] Out: -  Intake/Output this shift: No intake/output data recorded.  Lab Results:  Recent Labs  03/25/16 0835 03/26/16 0632 03/27/16 0607  WBC 8.3 7.8 8.1  HGB 11.9* 11.2* 11.2*  HCT 35.2* 33.2* 33.6*  PLT 219 245 240   BMET  Recent Labs  03/26/16 0632 03/27/16 0607  NA 137 135  K 3.5 3.6  CL 105 103  CO2 26 26  GLUCOSE 98 109*  BUN <5* <5*  CREATININE 0.83 0.77  CALCIUM 8.3* 8.2*   LFT No results for input(s): PROT, ALBUMIN, AST, ALT, ALKPHOS, BILITOT, BILIDIR, IBILI in the last 72  hours. PT/INR No results for input(s): LABPROT, INR in the last 72 hours. Hepatitis Panel No results for input(s): HEPBSAG, HCVAB, HEPAIGM, HEPBIGM in the last 72 hours.   Studies/Results: No results found.  Assessment: 25 year old male with history of ulcerative colitis, presenting with recurrent Cdiff. Prior treatment of Cdiff with Flagyl in Aug 2017. Off-label low dose Questran added for supportive measures, with some improvement in stool frequency initially but mothers states last night was worse and now with increased bleeding so it was discontonued yesterday. Continue oral vancomycin 250 mg QID, Flagyl 500 mg IV every 8 hours. Patient is immunocompromised due to IBD on Remicade. Referral has been made to [facility] IBD clinic for second opinion on IBD treatment and query FMT if another C. Diff recurrence.  Flagyl was d/c by hospitalist due to persistent nausea. Some nausea today and will likely take 1-2 days for this to resolve if antibiotics are the culprit. Began having diarrhea last night 3:30 am again with 7 stools from then until 10am. Admits minimal heme. Some residual abdominal discomfort. Remains on Vancomycin.   Per mom last Remicade dose end of August and next dose due in about 7-10 days. Question if new onset abdominal discomfort/diarrhea/heme today is related to C. Diff (with recently doing well) versus UC flare. Unsure if CRP would be helpful at this time to distinguish given C. Diff colitis. Will discuss with Dr. Gala Romney.  Plan: 1. Vancomycin: complete 14 day course then pulse dosing with 250 mg  q 3 days for 10 days due to immunocompromised state with 2nd recurrence of CDiff 2. Check CBC in the morning 3. Will make Zofran scheduled and add prn phenergan 4. Supportive measures 5. Monitor for recurrent GI Bleed   Thank you for allowing [place] to participate in the care of Sanjuana Kava, DNP, AGNP-C Adult & Gerontological Nurse Practitioner [facility]    LOS: 6 days    03/28/2016, 8:27 AM

## 2016-03-28 NOTE — Progress Notes (Signed)
PROGRESS NOTE    Wayne Avila  HQP:[id number] DOB: 10-07-1990 DOA: 03/22/2016 PCP: [name], MD    Brief Narrative:  25 year-old male with a PMHx of ulcerative colitis on Remicade, bipolar disorder, ADHD, and mental developmental delay who was recently treated with C. diff 8/17 presented with complaints of diarrhea. His recent CT scan revealed scattered colonic wall thickening most prominent in the sigmoid colon, consistent with colitis and hisC. difficile toxin was positive by PCR. He was discharged home with vancomycin. GI has been consulted and he has been started on flagyl and Questran. Admitted for further management of C. difficile colitis.  Assessment & Plan:   Principal Problem:   C. difficile colitis Active Problems:   Mental developmental delay   Oral thrush   Ulcerative colitis ([place])   Hypovolemia   Lower GI bleeding   Bipolar I disorder (HCC)   Acute blood loss anemia   Abnormal thyroid function test  1. Clostridium Difficile Colitis. Patient has been on vigorous IV fluids, and oral vancomycin. GI was consulted and recommended IV flagyl and Questran due to persistent bloody diarrhea. Overall, his mother reports that diarrhea is slowly improving. He is having nausea and vomiting today. Will continue antiemetics. Flagyl and Lucrezia Starch have been discontinued.  2. Hematochezia/ lower GI bleed causing acute blood loss anemia. Likely related to colitis. Hemoglobin is 11.2. There is no need for blood transfusion at this time. Will continue to monitor.  3. Oral thrush. I suspect he obtained this thrush from his steroid inhaler. Will continue oral Diflucan.  4. ADHD/Bipolar disorder. Stable. Continue Strattera, Zoloft, and trazodone.  5. Sinus tachycardia. Improving. Will continue IV fluids. 6. Low free T4. TSH is within normal limits although his free T4 is low at 0.49. Recommend follow-up TSH and free T4 in 3-6 months as the patient may have sick euthyroid syndrome.   DVT  prophylaxis: SCDs  Code Status: Full  Family Communication: Discussed with mother at bedside  Disposition Plan: Discharge home once improved.    Consultants:  Gastroenterology   Procedures:   None   Antimicrobials:   Flagyl 10/15 >>10/19  Oral Vancomycin 10/14 >>   Subjective: Had 7 stools this morning. Also has nausea today, no vomiting  Objective: Vitals:   03/27/16 0511 03/27/16 1242 03/27/16 2214 03/28/16 0524  BP: 122/74 140/82 (!) 116/50 (!) 104/53  Pulse: 86 77 87 96  Resp: 15 16 17 15   Temp: 99 F (37.2 C) 98.1 F (36.7 C) 98.5 F (36.9 C) 99.2 F (37.3 C)  TempSrc: Oral Oral Oral Oral  SpO2: 97% 97% 100% 94%  Weight: 106.5 kg (234 lb 12.8 oz)   106 kg (233 lb 11.2 oz)  Height:        Intake/Output Summary (Last 24 hours) at 03/28/16 0659 Last data filed at 03/28/16 0600  Gross per 24 hour  Intake          3028.83 ml  Output                0 ml  Net          3028.83 ml   Filed Weights   03/26/16 0641 03/27/16 0511 03/28/16 0524  Weight: 113.7 kg (250 lb 11.7 oz) 106.5 kg (234 lb 12.8 oz) 106 kg (233 lb 11.2 oz)    Examination:  General exam: Appears calm and comfortable  Respiratory system: Clear to auscultation. Respiratory effort normal. Cardiovascular system: S1 & S2 heard, RRR. No JVD, murmurs, rubs,  gallops or clicks. No pedal edema. Gastrointestinal system: Abdomen is nondistended, soft and nontender. No organomegaly or masses felt. Normal bowel sounds heard. Central nervous system: Alert and oriented. No focal neurological deficits. Extremities: Symmetric 5 x 5 power. Skin: No rashes, lesions or ulcers Psychiatry: Judgement and insight appear normal. Mood & affect appropriate.     Data Reviewed: I have personally reviewed following labs and imaging studies  CBC:  Recent Labs Lab 03/22/16 0804 03/23/16 0634 03/24/16 5631 03/25/16 0835 03/26/16 0632 03/27/16 0607  WBC 6.9 8.3 8.3 8.3 7.8 8.1  NEUTROABS 3.7  --   --   --   --    --   HGB 13.4 12.4* 11.2* 11.9* 11.2* 11.2*  HCT 38.6* 36.7* 33.1* 35.2* 33.2* 33.6*  MCV 82.1 84.2 83.0 83.2 83.4 83.0  PLT 241 228 197 219 245 497   Basic Metabolic Panel:  Recent Labs Lab 03/22/16 0804 03/23/16 0634 03/24/16 0642 03/26/16 0632 03/27/16 0607  NA 135 136 137 137 135  K 3.7 3.8 3.6 3.5 3.6  CL 104 103 105 105 103  CO2 24 27 25 26 26   GLUCOSE 108* 117* 98 98 109*  BUN <5* <5* <5* <5* <5*  CREATININE 0.91 0.90 0.75 0.83 0.77  CALCIUM 8.9 8.3* 8.2* 8.3* 8.2*   GFR: Estimated Creatinine Clearance: 169.4 mL/min (by C-G formula based on SCr of 0.77 mg/dL). Liver Function Tests:  Recent Labs Lab 03/22/16 0804 03/23/16 0634  AST 38 15  ALT 29 19  ALKPHOS 74 55  BILITOT 0.4 0.2*  PROT 6.8 6.2*  ALBUMIN 3.3* 2.9*    Recent Labs Lab 03/22/16 0804  LIPASE 16   No results for input(s): AMMONIA in the last 168 hours. Coagulation Profile: No results for input(s): INR, PROTIME in the last 168 hours. Cardiac Enzymes: No results for input(s): CKTOTAL, CKMB, CKMBINDEX, TROPONINI in the last 168 hours. BNP (last 3 results) No results for input(s): PROBNP in the last 8760 hours. HbA1C: No results for input(s): HGBA1C in the last 72 hours. CBG:  Recent Labs Lab 03/22/16 1615  GLUCAP 109*   Lipid Profile: No results for input(s): CHOL, HDL, LDLCALC, TRIG, CHOLHDL, LDLDIRECT in the last 72 hours. Thyroid Function Tests: No results for input(s): TSH, T4TOTAL, FREET4, T3FREE, THYROIDAB in the last 72 hours. Anemia Panel: No results for input(s): VITAMINB12, FOLATE, FERRITIN, TIBC, IRON, RETICCTPCT in the last 72 hours. Sepsis Labs: No results for input(s): PROCALCITON, LATICACIDVEN in the last 168 hours.  No results found for this or any previous visit (from the past 240 hour(s)).       Radiology Studies: No results found.      Scheduled Meds: . atomoxetine  120 mg Oral Daily  . divalproex  500 mg Oral BID  . feeding supplement  1 Container  Oral TID BM  . fluticasone furoate-vilanterol  1 puff Inhalation Daily  . loratadine  10 mg Oral QHS  . montelukast  10 mg Oral QHS  . sertraline  100 mg Oral Daily  . traZODone  100 mg Oral QHS  . vancomycin  250 mg Oral Q6H   Continuous Infusions: . 0.9 % NaCl with KCl 20 mEq / L 70 mL/hr at 03/27/16 2004     LOS: 6 days    Time spent: 25 minutes    Kathie Dike, MD [facility] If 7PM-7AM, please contact night-coverage [email or web address] Password [email or web address] 03/28/2016, 6:59 AM

## 2016-03-29 DIAGNOSIS — R109 Unspecified abdominal pain: Secondary | ICD-10-CM

## 2016-03-29 DIAGNOSIS — A0472 Enterocolitis due to Clostridium difficile, not specified as recurrent: Secondary | ICD-10-CM

## 2016-03-29 LAB — CBC
HCT: 34.7 % — ABNORMAL LOW (ref 39.0–52.0)
Hemoglobin: 11.7 g/dL — ABNORMAL LOW (ref 13.0–17.0)
MCH: 27.9 pg (ref 26.0–34.0)
MCHC: 33.7 g/dL (ref 30.0–36.0)
MCV: 82.6 fL (ref 78.0–100.0)
Platelets: 294 10*3/uL (ref 150–400)
RBC: 4.2 MIL/uL — ABNORMAL LOW (ref 4.22–5.81)
RDW: 15.4 % (ref 11.5–15.5)
WBC: 9.4 10*3/uL (ref 4.0–10.5)

## 2016-03-29 LAB — BASIC METABOLIC PANEL
Anion gap: 10 (ref 5–15)
BUN: <5 mg/dL — ABNORMAL LOW (ref 6–20)
CO2: 24 mmol/L (ref 22–32)
Calcium: 8.4 mg/dL — ABNORMAL LOW (ref 8.9–10.3)
Chloride: 104 mmol/L (ref 101–111)
Creatinine, Ser: 0.83 mg/dL (ref 0.61–1.24)
GFR calc Af Amer: >60 mL/min (ref >60)
GFR calc non Af Amer: >60 mL/min (ref >60)
Glucose, Bld: 106 mg/dL — ABNORMAL HIGH (ref 65–99)
Potassium: 3.6 mmol/L (ref 3.5–5.1)
Sodium: 138 mmol/L (ref 135–145)

## 2016-03-29 MED ORDER — SACCHAROMYCES BOULARDII 250 MG PO CAPS
250.0000 mg | ORAL_CAPSULE | Freq: Every day | ORAL | Status: DC
  Administered 2016-03-29 – 2016-03-30 (×2): 250 mg via ORAL
  Filled 2016-03-29 (×2): qty 1

## 2016-03-29 MED ORDER — POTASSIUM CHLORIDE IN NACL 20-0.9 MEQ/L-% IV SOLN
INTRAVENOUS | Status: DC
  Administered 2016-03-29 – 2016-03-30 (×2): via INTRAVENOUS

## 2016-03-29 MED ORDER — SACCHAROMYCES BOULARDII 250 MG PO CAPS: 250.0000 mg | ORAL_CAPSULE | Freq: Two times a day (BID) | ORAL | Status: DC

## 2016-03-29 NOTE — Progress Notes (Signed)
  Subjective:  Patient states he feels much better. He states his appetite is getting better. He ate more than 50% of his breakfast and at least get her way to eat his lunch. He states he has had 2 bowel movements today and they were soft and mushy and he noted scant amount of blood on the tissue. He denies abdominal pain. His mother is sitting at bedside and agrees.   Objective: Blood pressure 122/72, pulse 93, temperature 98.4 F (36.9 C), temperature source Oral, resp. rate 17, height 5' 8"  (1.727 m), weight 228 lb 3.2 oz (103.5 kg), SpO2 96 %. Patient is alert and in no acute distress. Abdomen is full. Bowel sounds are normal. On palpation abdomen is soft and nontender without organomegaly or masses. No LE edema or clubbing noted.  Labs/studies Results:   Recent Labs  03/27/16 0607 03/29/16 0602  WBC 8.1 9.4  HGB 11.2* 11.7*  HCT 33.6* 34.7*  PLT 240 294    BMET   Recent Labs  03/27/16 0607 03/29/16 0602  NA 135 138  K 3.6 3.6  CL 103 104  CO2 26 24  GLUCOSE 109* 106*  BUN <5* <5*  CREATININE 0.77 0.83  CALCIUM 8.2* 8.4*     Assessment:  #1.Recurrent C. difficile colitis. First episode occurred in August 2017 following antibiotic use for pneumonia. This is patient's second episode. Significant symptomatic improvement noted over the last 48 hours. Given that he has underlying UC and recurrence of C. Difficile.  #2. Ulcerative colitis. It appears he has been in remission since he was on Remicade.   Recommendations:  Continue by mouth vancomycin for total of 2 weeks at current dose and then after 250 mg by mouth twice a day for 2 more weeks and stop. Florastor 1 capsule by mouth daily. Should continue probiotic for at least 2 months.

## 2016-03-29 NOTE — Progress Notes (Signed)
PROGRESS NOTE    Wayne Avila  XJO:[id number] DOB: 17-[name]-1990 DOA: 03/22/2016 PCP: [name], MD    Brief Narrative:  25 year-old male with a PMHx of ulcerative colitis on Remicade, bipolar disorder, ADHD, and mental developmental delay who was recently treated with C. diff 8/17 presented with complaints of diarrhea. His recent CT scan revealed scattered colonic wall thickening most prominent in the sigmoid colon, consistent with colitis and hisC. difficile toxin was positive by PCR. He was discharged home with vancomycin. GI has been consulted and he has been started on flagyl and Questran. Admitted for further management of C. difficile colitis.  Assessment & Plan:   Principal Problem:   C. difficile colitis Active Problems:   Mental developmental delay   Oral thrush   Ulcerative colitis ([place])   Hypovolemia   Lower GI bleeding   Bipolar I disorder (HCC)   Acute blood loss anemia   Abnormal thyroid function test  1. Clostridium Difficile Colitis. Patient has been on vigorous IV fluids, and oral vancomycin. GI was consulted and recommended IV flagyl and Questran due to persistent bloody diarrhea. Overall his symptoms are improving and Flagyl/Questran have since been discontinued. He is continued on vancomycin. By mouth intake is improving. Diarrhea also improving. If patient continues to improve, anticipate discharge home the next 24 hours.   2. Hematochezia/ lower GI bleed causing acute blood loss anemia. Likely related to colitis. Per GI, blood in his stool is minimal. Hemoglobin is 11.2. There is no need for blood transfusion at this time. Will continue to monitor.  3. Oral thrush. I suspect he obtained this thrush from his steroid inhaler. Will continue oral Diflucan.  4. ADHD/Bipolar disorder. Stable. Continue Strattera, Zoloft, and trazodone.  5. Sinus tachycardia. Improving. Will continue IV fluids. 6. Low free T4. TSH is within normal limits although his free T4 is low  at 0.49. Recommend follow-up TSH and free T4 in 3-6 months as the patient may have sick euthyroid syndrome.   DVT prophylaxis: SCDs  Code Status: Full  Family Communication: discussed with patient Disposition Plan: Discharge home once improved.    Consultants:  Gastroenterology   Procedures:   None   Antimicrobials:   Flagyl 10/15 >>10/19  Oral Vancomycin 10/14 >>   Subjective: Reports having 2 bowel movements since this morning. Nausea has improved and he was able to eat breakfast.  Objective: Vitals:   03/28/16 1757 03/28/16 2231 03/29/16 0159 03/29/16 0543  BP: (!) 144/82 139/80 (!) 105/49 122/72  Pulse: 79 81 85 93  Resp: 19 18 17 17   Temp: 98.7 F (37.1 C) 99.3 F (37.4 C) 98.7 F (37.1 C) 98.4 F (36.9 C)  TempSrc: Oral Oral Oral Oral  SpO2: 100% 100% 99% 96%  Weight:    103.5 kg (228 lb 3.2 oz)  Height:        Intake/Output Summary (Last 24 hours) at 03/29/16 0643 Last data filed at 03/28/16 1757  Gross per 24 hour  Intake              120 ml  Output                0 ml  Net              120 ml   Filed Weights   03/27/16 0511 03/28/16 0524 03/29/16 0543  Weight: 106.5 kg (234 lb 12.8 oz) 106 kg (233 lb 11.2 oz) 103.5 kg (228 lb 3.2 oz)    Examination:  General exam: Appears calm and comfortable  Respiratory system: Clear to auscultation. Respiratory effort normal. Cardiovascular system: S1 & S2 heard, RRR. No JVD, murmurs, rubs, gallops or clicks. No pedal edema. Gastrointestinal system: Abdomen is nondistended, soft and nontender. No organomegaly or masses felt. Normal bowel sounds heard. Central nervous system: Alert and oriented. No focal neurological deficits. Extremities: Symmetric 5 x 5 power. Skin: No rashes, lesions or ulcers Psychiatry: Judgement and insight appear normal. Mood & affect appropriate.    Data Reviewed: I have personally reviewed following labs and imaging studies  CBC:  Recent Labs Lab 03/22/16 0804 03/23/16 0634  03/24/16 4650 03/25/16 0835 03/26/16 0632 03/27/16 0607  WBC 6.9 8.3 8.3 8.3 7.8 8.1  NEUTROABS 3.7  --   --   --   --   --   HGB 13.4 12.4* 11.2* 11.9* 11.2* 11.2*  HCT 38.6* 36.7* 33.1* 35.2* 33.2* 33.6*  MCV 82.1 84.2 83.0 83.2 83.4 83.0  PLT 241 228 197 219 245 354   Basic Metabolic Panel:  Recent Labs Lab 03/22/16 0804 03/23/16 0634 03/24/16 0642 03/26/16 0632 03/27/16 0607  NA 135 136 137 137 135  K 3.7 3.8 3.6 3.5 3.6  CL 104 103 105 105 103  CO2 24 27 25 26 26   GLUCOSE 108* 117* 98 98 109*  BUN <5* <5* <5* <5* <5*  CREATININE 0.91 0.90 0.75 0.83 0.77  CALCIUM 8.9 8.3* 8.2* 8.3* 8.2*   GFR: Estimated Creatinine Clearance: 167.4 mL/min (by C-G formula based on SCr of 0.77 mg/dL). Liver Function Tests:  Recent Labs Lab 03/22/16 0804 03/23/16 0634  AST 38 15  ALT 29 19  ALKPHOS 74 55  BILITOT 0.4 0.2*  PROT 6.8 6.2*  ALBUMIN 3.3* 2.9*    Recent Labs Lab 03/22/16 0804  LIPASE 16   No results for input(s): AMMONIA in the last 168 hours. Coagulation Profile: No results for input(s): INR, PROTIME in the last 168 hours. Cardiac Enzymes: No results for input(s): CKTOTAL, CKMB, CKMBINDEX, TROPONINI in the last 168 hours. BNP (last 3 results) No results for input(s): PROBNP in the last 8760 hours. HbA1C: No results for input(s): HGBA1C in the last 72 hours. CBG:  Recent Labs Lab 03/22/16 1615  GLUCAP 109*   Lipid Profile: No results for input(s): CHOL, HDL, LDLCALC, TRIG, CHOLHDL, LDLDIRECT in the last 72 hours. Thyroid Function Tests: No results for input(s): TSH, T4TOTAL, FREET4, T3FREE, THYROIDAB in the last 72 hours. Anemia Panel: No results for input(s): VITAMINB12, FOLATE, FERRITIN, TIBC, IRON, RETICCTPCT in the last 72 hours. Sepsis Labs: No results for input(s): PROCALCITON, LATICACIDVEN in the last 168 hours.  No results found for this or any previous visit (from the past 240 hour(s)).    Radiology Studies: No results  found.    Scheduled Meds: . atomoxetine  120 mg Oral Daily  . divalproex  500 mg Oral BID  . feeding supplement  1 Container Oral TID BM  . fluticasone furoate-vilanterol  1 puff Inhalation Daily  . loratadine  10 mg Oral QHS  . montelukast  10 mg Oral QHS  . ondansetron  4 mg Oral Q6H   Or  . ondansetron (ZOFRAN) IV  4 mg Intravenous Q6H  . sertraline  100 mg Oral Daily  . traZODone  100 mg Oral QHS  . vancomycin  250 mg Oral Q6H   Continuous Infusions:     LOS: 7 days    Time spent: 25 minutes    Kathie Dike, MD [facility] If 7PM-7AM, please contact night-coverage [email or web address] Password [facility] 03/29/2016, 6:43 AM

## 2016-03-30 MED ORDER — HYDROCODONE-ACETAMINOPHEN 5-325 MG PO TABS
1.0000 | ORAL_TABLET | Freq: Four times a day (QID) | ORAL | Status: DC | PRN
  Administered 2016-03-30 – 2016-03-31 (×2): 1 via ORAL
  Filled 2016-03-30 (×2): qty 1

## 2016-03-30 NOTE — Progress Notes (Signed)
PROGRESS NOTE    Wayne Avila  JHE:[id number] DOB: 24-Jun-1990 DOA: 03/22/2016 PCP: [name], MD    Brief Narrative:  25 year-old male with a PMHx of ulcerative colitis on Remicade, bipolar disorder, ADHD, and mental developmental delay who was recently treated for C. diff 8/17 presented with complaints of diarrhea. His recent CT scan revealed scattered colonic wall thickening most prominent in the sigmoid colon, consistent with colitis and hisC. difficile toxin was positive by PCR. He was discharged home with vancomycin. GI has been consulted and he has been started on flagyl and Questran. Admitted for further management of C. difficile colitis.   Assessment & Plan:   Principal Problem:   C. difficile colitis Active Problems:   Mental developmental delay   Oral thrush   Ulcerative colitis ([place])   Hypovolemia   Lower GI bleeding   Bipolar I disorder (HCC)   Acute blood loss anemia   Abnormal thyroid function test  1. Clostridium Difficile Colitis. Patient has been on vigorous IV fluids, and oralvancomycin. GI was consulted and recommended IVflagyl and Questran due to persistent bloody diarrhea. Overall his symptoms are improving and Flagyl/Questran have since been discontinued. Per GI, patient will continue po vancomycin for 2 weeks at current dose, followed by an additional 2 weeks of 253m twice a day. GI also recommends 1 capsule of Florastor daily. Continue probiotics for at least two months as an outpatient. Patient's mother says he has had 4 episodes of bloody diarrhea since this morning. He is complaining of sharp lower abdominal pain. Will await further recommendations from GI.  2. Hematochezia/ lower GI bleed causing acute blood loss anemia. Likely related to colitis. Per GI, blood in his stool is minimal. Hemoglobin has been stable. There is no need for blood transfusion at this time. Will continue to monitor.  3. Oral thrush. I suspect he obtained this thrush from his  steroid inhaler. Will continue oral Diflucan.  4. ADHD/Bipolar disorder. Stable. Continue Strattera, Zoloft, and trazodone.  5. Sinus tachycardia. Improving. Will continue IV fluids. 6. Low free T4. TSH is within normal limits although his free T4 is low at 0.49. Recommend follow-up TSH and free T4 in 3-6 months as the patient may have sick euthyroid syndrome.    DVT prophylaxis: SCDs Code Status: Full Family Communication: discussed with mom at the bedside Disposition Plan: Discharge home once improved   Consultants:   Gastroenterology  Procedures:   None  Antimicrobials:   Flagyl 10/15 >> 10/19  Oral Vancomycin 10/14 >>   Subjective: Reports bloody diarrhea started this morning. Having sharp lower abdominal pain  Objective: Vitals:   03/29/16 1457 03/29/16 2000 03/30/16 0001 03/30/16 0606  BP: 114/73 126/74 103/61 121/78  Pulse: 97 83 (!) 101 91  Resp: 20 20 20 20   Temp: 98.7 F (37.1 C) 98.8 F (37.1 C) 99 F (37.2 C) 98.7 F (37.1 C)  TempSrc: Oral Oral Oral Oral  SpO2: 98% 100% 100% 100%  Weight:    103.3 kg (227 lb 12.8 oz)  Height:        Intake/Output Summary (Last 24 hours) at 03/30/16 0700 Last data filed at 03/30/16 0400  Gross per 24 hour  Intake           1015.5 ml  Output                0 ml  Net           1015.5 ml   FAutoliv  03/28/16 0524 03/29/16 0543 03/30/16 0606  Weight: 106 kg (233 lb 11.2 oz) 103.5 kg (228 lb 3.2 oz) 103.3 kg (227 lb 12.8 oz)    Examination:  General exam: Appears calm and comfortable  Respiratory system: Clear to auscultation. Respiratory effort normal. Cardiovascular system: S1 & S2 heard, RRR. No JVD, murmurs, rubs, gallops or clicks. No pedal edema. Gastrointestinal system: Abdomen is nondistended, soft and nontender. No organomegaly or masses felt. Normal bowel sounds heard. Central nervous system: Alert and oriented. No focal neurological deficits. Extremities: Symmetric 5 x 5 power. Skin: No  rashes, lesions or ulcers Psychiatry: Judgement and insight appear normal. Mood & affect appropriate.     Data Reviewed: I have personally reviewed following labs and imaging studies  CBC:  Recent Labs Lab 03/24/16 0642 03/25/16 0835 03/26/16 0632 03/27/16 0607 03/29/16 0602  WBC 8.3 8.3 7.8 8.1 9.4  HGB 11.2* 11.9* 11.2* 11.2* 11.7*  HCT 33.1* 35.2* 33.2* 33.6* 34.7*  MCV 83.0 83.2 83.4 83.0 82.6  PLT 197 219 245 240 831   Basic Metabolic Panel:  Recent Labs Lab 03/24/16 0642 03/26/16 0632 03/27/16 0607 03/29/16 0602  NA 137 137 135 138  K 3.6 3.5 3.6 3.6  CL 105 105 103 104  CO2 25 26 26 24   GLUCOSE 98 98 109* 106*  BUN <5* <5* <5* <5*  CREATININE 0.75 0.83 0.77 0.83  CALCIUM 8.2* 8.3* 8.2* 8.4*   GFR: Estimated Creatinine Clearance: 161.3 mL/min (by C-G formula based on SCr of 0.83 mg/dL). Liver Function Tests: No results for input(s): AST, ALT, ALKPHOS, BILITOT, PROT, ALBUMIN in the last 168 hours. No results for input(s): LIPASE, AMYLASE in the last 168 hours. No results for input(s): AMMONIA in the last 168 hours. Coagulation Profile: No results for input(s): INR, PROTIME in the last 168 hours. Cardiac Enzymes: No results for input(s): CKTOTAL, CKMB, CKMBINDEX, TROPONINI in the last 168 hours. BNP (last 3 results) No results for input(s): PROBNP in the last 8760 hours. HbA1C: No results for input(s): HGBA1C in the last 72 hours. CBG: No results for input(s): GLUCAP in the last 168 hours. Lipid Profile: No results for input(s): CHOL, HDL, LDLCALC, TRIG, CHOLHDL, LDLDIRECT in the last 72 hours. Thyroid Function Tests: No results for input(s): TSH, T4TOTAL, FREET4, T3FREE, THYROIDAB in the last 72 hours. Anemia Panel: No results for input(s): VITAMINB12, FOLATE, FERRITIN, TIBC, IRON, RETICCTPCT in the last 72 hours. Sepsis Labs: No results for input(s): PROCALCITON, LATICACIDVEN in the last 168 hours.  No results found for this or any previous visit  (from the past 240 hour(s)).       Radiology Studies: No results found.      Scheduled Meds: . atomoxetine  120 mg Oral Daily  . divalproex  500 mg Oral BID  . feeding supplement  1 Container Oral TID BM  . fluticasone furoate-vilanterol  1 puff Inhalation Daily  . loratadine  10 mg Oral QHS  . montelukast  10 mg Oral QHS  . ondansetron  4 mg Oral Q6H   Or  . ondansetron (ZOFRAN) IV  4 mg Intravenous Q6H  . saccharomyces boulardii  250 mg Oral Q1400  . sertraline  100 mg Oral Daily  . traZODone  100 mg Oral QHS  . vancomycin  250 mg Oral Q6H   Continuous Infusions: . 0.9 % NaCl with KCl 20 mEq / L 70 mL/hr at 03/29/16 2021     LOS: 8 days    Time spent: 25 minutes  Kathie Dike, MD [facility]  If 7PM-7AM, please contact night-coverage [email or web address] Password [email or web address] 03/30/2016, 7:00 AM

## 2016-03-30 NOTE — Plan of Care (Signed)
Notified Dr. Roderic Palau that I had discussed with the patients mom and the patient that when he has a bowel movement the nurse or the NT should be notified so that the stool can be assessed.  Mom and the patient verbalized understanding.  Multiple stools in the am per mom but even after speaking with the MD still flushed the stools.  One stool was seen this evening and the last stool was flushed.  Mom stated that it was hardly nothing.  The patient has had an health appetite.  He states that he is very [place] and was at the time eating a bag of potato chips and drinking a soda.  He voices that he has no pain and I have minimally had zofran today.  MD verbalized understanding.  We will continue to monitor the patient.

## 2016-03-30 NOTE — Progress Notes (Signed)
Pt had one minimally bloody stool at 2100 and one stool of diarrheal consistency with no visible blood at 2200.

## 2016-03-31 ENCOUNTER — Telehealth: Payer: Self-pay | Admitting: Gastroenterology

## 2016-03-31 LAB — CBC
HCT: 34.8 % — ABNORMAL LOW (ref 39.0–52.0)
Hemoglobin: 11.6 g/dL — ABNORMAL LOW (ref 13.0–17.0)
MCH: 27.8 pg (ref 26.0–34.0)
MCHC: 33.3 g/dL (ref 30.0–36.0)
MCV: 83.3 fL (ref 78.0–100.0)
Platelets: 330 10*3/uL (ref 150–400)
RBC: 4.18 MIL/uL — ABNORMAL LOW (ref 4.22–5.81)
RDW: 15.4 % (ref 11.5–15.5)
WBC: 9.5 10*3/uL (ref 4.0–10.5)

## 2016-03-31 LAB — BASIC METABOLIC PANEL
Anion gap: 6 (ref 5–15)
BUN: <5 mg/dL — ABNORMAL LOW (ref 6–20)
CO2: 24 mmol/L (ref 22–32)
Calcium: 8.6 mg/dL — ABNORMAL LOW (ref 8.9–10.3)
Chloride: 107 mmol/L (ref 101–111)
Creatinine, Ser: 0.86 mg/dL (ref 0.61–1.24)
GFR calc Af Amer: >60 mL/min (ref >60)
GFR calc non Af Amer: >60 mL/min (ref >60)
Glucose, Bld: 95 mg/dL (ref 65–99)
Potassium: 4 mmol/L (ref 3.5–5.1)
Sodium: 137 mmol/L (ref 135–145)

## 2016-03-31 MED ORDER — VANCOMYCIN 50 MG/ML ORAL SOLUTION: ORAL | 0 refills | Status: DC

## 2016-03-31 MED ORDER — SACCHAROMYCES BOULARDII 250 MG PO CAPS: 250.0000 mg | ORAL_CAPSULE | Freq: Every day | ORAL | 1 refills | Status: DC

## 2016-03-31 NOTE — Care Management Note (Signed)
Case Management Note  Patient Details  Name: Wayne Avila MRN: [id number] Date of Birth: [birth year]   Expected Discharge Date:     03/31/2016             Expected Discharge Plan:  Home/Self Care  In-House Referral:  NA  Discharge planning Services  CM Consult  Post Acute Care Choice:  NA Choice offered to:  NA  DME Arranged:    DME Agency:     HH Arranged:    [place] Agency:     Status of Service:  Completed, signed off  If discussed at [employer] of Stay Meetings, dates discussed:    Additional Comments: Patient discharging today, he lives with his mother and is ind with ADL's. He will return home with self care.  He has a PCP, transportation, and has no issues affording medications.  Keith Cancio, Chauncey [place], RN 03/31/2016, 1:16 PM

## 2016-03-31 NOTE — Progress Notes (Signed)
Pt IV removed, tolerated well. Reviewed discharge instructions with pt and mother.  Answered questions at this time.

## 2016-03-31 NOTE — Progress Notes (Signed)
Pt had bowel movement with scant amount of blood visible approximately 0400.

## 2016-03-31 NOTE — Discharge Summary (Signed)
Physician Discharge Summary  MARSEL Avila AJG:[id number] DOB: 1990-10-27 DOA: 03/22/2016  PCP: [name], MD  Admit date: 03/22/2016 Discharge date: 03/31/2016  Admitted From: home Disposition: home  Recommendations for Outpatient Follow-up:  1. Follow up with PCP in 1-2 weeks 2. Follow up with gastroenterology on 11/1 as previously scheduled 3. [facility]  Home Health: Equipment/Devices:  Discharge Condition: stable CODE STATUS: full Diet recommendation: low fiber  Brief/Interim Summary: 25 year-old male with a PMHx of ulcerative colitis on Remicade, bipolar disorder, ADHD, and mental developmental delay who was recently treated for C. diff 8/17 presented with complaints of diarrhea. His recent CT scan revealed scattered colonic wall thickening most prominent in the sigmoid colon, consistent with colitis and hisC. difficile toxin was positive by PCR. He was discharged home with vancomycin. GI has been consulted and he has been started on flagyl and Questran. Admitted for further management of C. difficile colitis  Discharge Diagnoses:  Principal Problem:   C. difficile colitis Active Problems:   Mental developmental delay   Oral thrush   Ulcerative colitis ([place])   Hypovolemia   Lower GI bleeding   Bipolar I disorder ([place])   Acute blood loss anemia   Abnormal thyroid function test  1. Clostridium Difficile Colitis. Patient has been on vigorous IV fluids, and oralvancomycin. GI was consulted and recommended IVflagyl and Questran due to persistent bloody diarrhea. Overall his symptoms are improving and Flagyl/Questran have since been discontinued. Per GI, patient will continue po vancomycin for a total of 2 weeks at current dose, followed by vancomycin taper. GI also recommends 1 capsule of Florastor daily. Continue probiotics for at least two months as an outpatient. His diarrhea has since improved and nausea has resolved. He has good po  intake and no abdominal pain. He will discharge home today. Will discontinue home H2 Blockers. He will follow up with GI in 1 week. He has been referred to [facility] to be considered for fecal transplant  2. Hematochezia/ lower GI bleed causing acute blood loss anemia. Likely related to colitis. Per GI, blood in his stool is minimal.Hemoglobin has been stable. There is no need for blood transfusion at this time. Will continue to monitor.  3. Ulcerative colitis. Follow up with GI for remicaide treatments. 4. Oral thrush. I suspect he obtained this thrush from his steroid inhaler. Treated with Diflucan.  5. ADHD/Bipolar disorder. Stable. Continue Strattera, Zoloft, and trazodone.  6. Sinus tachycardia. Resolved with hydration. 7. Low free T4. TSH is within normal limits although his free T4 is low at 0.49. Recommend follow-up TSH and free T4 in 3-6 months as the patient may have sick euthyroid syndrome.   Discharge Instructions  Discharge Instructions    Diet - low sodium heart healthy    Complete by:  As directed    Increase activity slowly    Complete by:  As directed        Medication List    STOP taking these medications   HYDROmorphone 4 MG tablet Commonly known as:  DILAUDID   ranitidine 150 MG tablet Commonly known as:  ZANTAC     TAKE these medications   albuterol (2.5 MG/3ML) 0.083% nebulizer solution Commonly known as:  PROVENTIL Inhale 2.5 mg into the lungs every 6 (six) hours as needed for wheezing or shortness of breath.   albuterol 108 (90 Base) MCG/ACT inhaler Commonly known as:  PROVENTIL HFA;VENTOLIN HFA Inhale 2 puffs into the lungs every  4 (four) hours as needed for wheezing or shortness of breath (and/or cough).   atomoxetine 25 MG capsule Commonly known as:  STRATTERA Take 25 mg by mouth daily. Pt takes with a 168m capsule.   atomoxetine 100 MG capsule Commonly known as:  STRATTERA Take 100 mg by mouth daily. Pt takes with a 232mcapsule.    budesonide-formoterol 160-4.5 MCG/ACT inhaler Commonly known as:  SYMBICORT Inhale 2 puffs into the lungs 2 (two) times daily.   divalproex 500 MG DR tablet Commonly known as:  DEPAKOTE Take 1 tablet (500 mg total) by mouth 2 (two) times daily.   EPIPEN 2-PAK 0.3 mg/0.3 mL Soaj injection Generic drug:  EPINEPHrine Inject 0.3 mg into the muscle once as needed (for severe allergic reaction).   fluticasone 50 MCG/ACT nasal spray Commonly known as:  FLONASE Place 1-2 sprays into both nostrils daily as needed for rhinitis.   HYDROcodone-acetaminophen 5-325 MG tablet Commonly known as:  NORCO/VICODIN 1 or 2 tabs PO q6 hours prn pain   loratadine 10 MG tablet Commonly known as:  CLARITIN Take 10 mg by mouth at bedtime.   montelukast 10 MG tablet Commonly known as:  SINGULAIR Take 1 tablet (10 mg total) by mouth at bedtime.   ondansetron 8 MG disintegrating tablet Commonly known as:  ZOFRAN ODT Take 1 tablet (8 mg total) by mouth every 8 (eight) hours as needed for nausea or vomiting.   promethazine 12.5 MG tablet Commonly known as:  PHENERGAN 1 PO BID. MAY USE ADDITIONAL DOSE EVERY 4 HOURS. NO MORE THAN 8 DOSES A DAY. What changed:  how much to take  how to take this  when to take this  reasons to take this  additional instructions   saccharomyces boulardii 250 MG capsule Commonly known as:  FLORASTOR Take 1 capsule (250 mg total) by mouth daily at 2 PM.   sertraline 100 MG tablet Commonly known as:  ZOLOFT Take 1 tablet (100 mg total) by mouth daily.   traMADol 50 MG tablet Commonly known as:  ULTRAM Take 100 mg by mouth every 6 (six) hours as needed for moderate pain (headache pain).   traZODone 100 MG tablet Commonly known as:  DESYREL Take 100 mg by mouth at bedtime.   triamcinolone cream 0.1 % Commonly known as:  KENALOG Apply 1 application topically 2 (two) times daily as needed (for itching).   vancomycin 50 mg/mL oral solution Commonly known as:   VANCOCIN Take 25041mo qid for 1 week then daily for 7 days, every other day for 7 days, then every 3 days for 14 days What changed:  medication strength  how much to take  how to take this  when to take this  additional instructions       Allergies  Allergen Reactions  . Amoxicillin-Pot Clavulanate Nausea And Vomiting and Other (See Comments)    Has patient had a PCN reaction causing immediate rash, facial/tongue/throat swelling, SOB or lightheadedness with hypotension: No Has patient had a PCN reaction causing severe rash involving mucus membranes or skin necrosis: No Has patient had a PCN reaction that required hospitalization No Has patient had a PCN reaction occurring within the last 10 years: No If all of the above answers are "NO", then may proceed with Cephalosporin use.  . Ibuprofen Other (See Comments)    Per pts MD he is unable to take this medication.   . Omeprazole Nausea And Vomiting  . Pineapple Swelling and Other (See Comments)  Reaction:  Lip swelling  . Strawberry Extract Swelling and Other (See Comments)    Reaction:  Lip swelling  . Tomato Rash    Consultations:  gastroenterology   Procedures/Studies: Dg Chest 2 View  Result Date: 03/01/2016 CLINICAL DATA:  Centralized chest pain and right upper quadrant pain that began 2 hours ago. EXAM: CHEST  2 VIEW COMPARISON:  February 18, 2016 FINDINGS: The heart size and mediastinal contours are within normal limits. Both lungs are clear. The visualized skeletal structures are unremarkable. IMPRESSION: No active cardiopulmonary disease. Electronically Signed   By: Dorise Bullion III M.D   On: 03/01/2016 18:09   Ct Abdomen Pelvis W Contrast  Result Date: 03/18/2016 CLINICAL DATA:  Abdominal pain. Recent diagnosis of C diff. Patient reports diarrhea. EXAM: CT ABDOMEN AND PELVIS WITH CONTRAST TECHNIQUE: Multidetector CT imaging of the abdomen and pelvis was performed using the standard protocol following  bolus administration of intravenous contrast. CONTRAST:  15m ISOVUE-300 IOPAMIDOL (ISOVUE-300) INJECTION 61% COMPARISON:  CT 01/27/2016, 11/21/2015, additional priors reviewed FINDINGS: Lower chest: Lung bases are clear. Hepatobiliary: No focal liver abnormality is seen. No gallstones, gallbladder wall thickening, or biliary dilatation. Pancreas: Unremarkable. No pancreatic ductal dilatation or surrounding inflammatory changes. Spleen: Normal in size without focal abnormality. Adrenals/Urinary Tract: Adrenal glands are unremarkable. Kidneys are normal, without renal calculi, focal lesion, or hydronephrosis. Bladder is unremarkable. Stomach/Bowel: Scattered colonic wall thickening involving the sigmoid colon, splenic flexure, and descending colon. No small bowel dilatation. Stomach is physiologically distended. Appendix is normal. Vascular/Lymphatic: No significant vascular findings are present. No enlarged abdominal or pelvic lymph nodes. Reproductive: Prostate is unremarkable. Other: No abdominal wall hernia or abnormality. No abdominopelvic ascites. Musculoskeletal: There are no acute or suspicious osseous abnormalities. IMPRESSION: Scattered colonic wall thickening most prominent in the sigmoid colon, consistent with colitis. Electronically Signed   By: MJeb LeveringM.D.   On: 03/18/2016 06:09   Dg Abd 2 Views  Result Date: 03/01/2016 CLINICAL DATA:  Acute onset of central chest pain and right upper quadrant abdominal pain. Initial encounter. EXAM: ABDOMEN - 2 VIEW COMPARISON:  Abdominal radiograph performed 02/18/2016 FINDINGS: The visualized bowel gas pattern is unremarkable. Scattered air and stool filled loops of colon are seen; no abnormal dilatation of small bowel loops is seen to suggest small bowel obstruction. No free intra-abdominal air is identified on the provided upright view. The visualized osseous structures are within normal limits; the sacroiliac joints are unremarkable in appearance.  IMPRESSION: Unremarkable bowel gas pattern; no free intra-abdominal air seen. Small to moderate amount of stool noted in the colon. Electronically Signed   By: JGarald BaldingM.D.   On: 03/01/2016 18:31       Subjective: Feeling better. No nausea, good po intake, diarrhea improving. Abdominal pain has resolved  Discharge Exam: Vitals:   03/31/16 0100 03/31/16 0604  BP: 134/64 122/69  Pulse: 84 83  Resp: 20 20  Temp: 98 F (36.7 C) 98.4 F (36.9 C)   Vitals:   03/30/16 1717 03/30/16 2112 03/31/16 0100 03/31/16 0604  BP: 140/80 137/81 134/64 122/69  Pulse: 93 72 84 83  Resp: 20 20 20 20   Temp: 98.7 F (37.1 C) 97.7 F (36.5 C) 98 F (36.7 C) 98.4 F (36.9 C)  TempSrc:  Oral Oral Oral  SpO2: 100% 100% 100% 100%  Weight:    103.4 kg (228 lb)  Height:        General: Pt is alert, awake, not in acute distress Cardiovascular: RRR, S1/S2 +,  no rubs, no gallops Respiratory: CTA bilaterally, no wheezing, no rhonchi Abdominal: Soft, NT, ND, bowel sounds + Extremities: no edema, no cyanosis    The results of significant diagnostics from this hospitalization (including imaging, microbiology, ancillary and laboratory) are listed below for reference.     Microbiology: No results found for this or any previous visit (from the past 240 hour(s)).   Labs: BNP (last 3 results) No results for input(s): BNP in the last 8760 hours. Basic Metabolic Panel:  Recent Labs Lab 03/26/16 0632 03/27/16 0607 03/29/16 0602 03/31/16 0605  NA 137 135 138 137  K 3.5 3.6 3.6 4.0  CL 105 103 104 107  CO2 26 26 24 24   GLUCOSE 98 109* 106* 95  BUN <5* <5* <5* <5*  CREATININE 0.83 0.77 0.83 0.86  CALCIUM 8.3* 8.2* 8.4* 8.6*   Liver Function Tests: No results for input(s): AST, ALT, ALKPHOS, BILITOT, PROT, ALBUMIN in the last 168 hours. No results for input(s): LIPASE, AMYLASE in the last 168 hours. No results for input(s): AMMONIA in the last 168 hours. CBC:  Recent Labs Lab  03/25/16 0835 03/26/16 6815 03/27/16 0607 03/29/16 0602 03/31/16 0605  WBC 8.3 7.8 8.1 9.4 9.5  HGB 11.9* 11.2* 11.2* 11.7* 11.6*  HCT 35.2* 33.2* 33.6* 34.7* 34.8*  MCV 83.2 83.4 83.0 82.6 83.3  PLT 219 245 240 294 330   Cardiac Enzymes: No results for input(s): CKTOTAL, CKMB, CKMBINDEX, TROPONINI in the last 168 hours. BNP: Invalid input(s): POCBNP CBG: No results for input(s): GLUCAP in the last 168 hours. D-Dimer No results for input(s): DDIMER in the last 72 hours. Hgb A1c No results for input(s): HGBA1C in the last 72 hours. Lipid Profile No results for input(s): CHOL, HDL, LDLCALC, TRIG, CHOLHDL, LDLDIRECT in the last 72 hours. Thyroid function studies No results for input(s): TSH, T4TOTAL, T3FREE, THYROIDAB in the last 72 hours.  Invalid input(s): FREET3 Anemia work up No results for input(s): VITAMINB12, FOLATE, FERRITIN, TIBC, IRON, RETICCTPCT in the last 72 hours. Urinalysis    Component Value Date/Time   COLORURINE YELLOW 03/23/2016 [place] 03/23/2016 0734   LABSPEC <1.005 (L) 03/23/2016 0734   PHURINE 6.0 03/23/2016 0734   GLUCOSEU 100 (A) 03/23/2016 0734   HGBUR NEGATIVE 03/23/2016 0734   BILIRUBINUR NEGATIVE 03/23/2016 0734   KETONESUR NEGATIVE 03/23/2016 0734   PROTEINUR NEGATIVE 03/23/2016 0734   UROBILINOGEN 0.2 09/04/2014 2355   NITRITE NEGATIVE 03/23/2016 0734   LEUKOCYTESUR NEGATIVE 03/23/2016 0734   Sepsis Labs Invalid input(s): PROCALCITONIN,  WBC,  LACTICIDVEN Microbiology No results found for this or any previous visit (from the past 240 hour(s)).   Time coordinating discharge: Over 30 minutes  SIGNED:   Kathie Dike, MD  [facility] 03/31/2016, 1:08 PM Pager   If 7PM-7AM, please contact night-coverage [email or web address] Password [email or web address]

## 2016-03-31 NOTE — Progress Notes (Signed)
Pt experienced bloody stool at 2345.

## 2016-03-31 NOTE — Progress Notes (Signed)
    Subjective: Stools documented per nursing at 2100 with minimal blood, one diarrheal stool without blood at 2200, bloody stool at 2345, and a BM with scant blood this morning at 0400. Appetite is "pretty good". Ate well at breakfast. Abdominal pain improved. Interested in going home.   Objective: Vital signs in last 24 hours: Temp:  [97.7 F (36.5 C)-98.7 F (37.1 C)] 98.4 F (36.9 C) (10/23 0604) Pulse Rate:  [72-93] 83 (10/23 0604) Resp:  [20] 20 (10/23 0604) BP: (122-140)/(64-81) 122/69 (10/23 0604) SpO2:  [100 %] 100 % (10/23 0604) Weight:  [228 lb (103.4 kg)] 228 lb (103.4 kg) (10/23 0604) Last BM Date: 03/30/16 General:   Alert and oriented, Flat affect  Head:  Normocephalic and atraumatic. Eyes:  No icterus, sclera clear. Conjuctiva pink.  Abdomen:  Bowel sounds present, soft, non-tender, non-distended.  Extremities:  Without  edema. Neurologic:  Alert and  oriented x4 Psych:  Alert and cooperative.   Intake/Output from previous day: 10/22 0701 - 10/23 0700 In: 480 [P.O.:480] Out: -  Intake/Output this shift: No intake/output data recorded.  Lab Results:  Recent Labs  03/29/16 0602 03/31/16 0605  WBC 9.4 9.5  HGB 11.7* 11.6*  HCT 34.7* 34.8*  PLT 294 330   BMET  Recent Labs  03/29/16 0602 03/31/16 0605  NA 138 137  K 3.6 4.0  CL 104 107  CO2 24 24  GLUCOSE 106* 95  BUN <5* <5*  CREATININE 0.83 0.86  CALCIUM 8.4* 8.6*    Assessment: 25 year old male with history of ulcerative colitis, presenting with recurrent Cdiff. Prior treatment of Cdiff with Flagyl in Aug 2017. Overall, stool output, frequency, and rectal bleeding have improved. Tolerating diet, and abdominal pain has improved. Flagyl has been discontinued. Needs to complete entire 14 day course of Vancomycin (first dose started on 10/14), followed by taper as outlined below.   Referral to [facility] in place for tertiary opinion regarding IBD treatment and/or fecal transplant due to known  ulcerative colitis, history of recurrent Cdiff, on Remicade (starting Jul 20th). Appt scheduled for Nov 20th at 2:30 pm.   Plan: Continue Vancomycin 250 mg for a complete 14 day course, followed by taper as follows: 250 mg BID for 1 week, then 250 mg daily for 1 week, then 250 mg every other day for 1 week, then 250 mg every other day for 14 days and stop.  Continue probiotic  Keep appt at [facility] for Nov 20th Avoid PPIs Hopeful discharge soon  [name], ANP-BC [facility]     LOS: 9 days    03/31/2016, 8:24 AM

## 2016-03-31 NOTE — Telephone Encounter (Signed)
Patient has appt scheduled with Dr. [name] on 11/1.   Wayne Avila, ANP-BC [facility]

## 2016-04-03 ENCOUNTER — Encounter (HOSPITAL_COMMUNITY): Payer: Self-pay

## 2016-04-05 ENCOUNTER — Encounter (HOSPITAL_COMMUNITY): Payer: Self-pay | Admitting: Emergency Medicine

## 2016-04-05 ENCOUNTER — Emergency Department (HOSPITAL_COMMUNITY)
Admission: EM | Admit: 2016-04-05 | Discharge: 2016-04-05 | Disposition: A | Discharge status: Home / Self Care | Payer: Medicaid Other | Attending: Emergency Medicine | Admitting: Emergency Medicine

## 2016-04-05 DIAGNOSIS — F909 Attention-deficit hyperactivity disorder, unspecified type: Secondary | ICD-10-CM | POA: Diagnosis not present

## 2016-04-05 DIAGNOSIS — R3912 Poor urinary stream: Secondary | ICD-10-CM | POA: Insufficient documentation

## 2016-04-05 DIAGNOSIS — Z79899 Other long term (current) drug therapy: Secondary | ICD-10-CM | POA: Diagnosis not present

## 2016-04-05 DIAGNOSIS — Z87891 Personal history of nicotine dependence: Secondary | ICD-10-CM | POA: Insufficient documentation

## 2016-04-05 DIAGNOSIS — R350 Frequency of micturition: Secondary | ICD-10-CM | POA: Diagnosis present

## 2016-04-05 DIAGNOSIS — R34 Anuria and oliguria: Secondary | ICD-10-CM

## 2016-04-05 DIAGNOSIS — J45909 Unspecified asthma, uncomplicated: Secondary | ICD-10-CM | POA: Insufficient documentation

## 2016-04-05 LAB — URINE MICROSCOPIC-ADD ON: WBC, UA: NONE SEEN WBC/hpf (ref 0–5)

## 2016-04-05 LAB — CBC WITH DIFFERENTIAL/PLATELET
Basophils Absolute: 0 10*3/uL (ref 0.0–0.1)
Basophils Relative: 0 %
Eosinophils Absolute: 0.8 10*3/uL — ABNORMAL HIGH (ref 0.0–0.7)
Eosinophils Relative: 9 %
HCT: 35.7 % — ABNORMAL LOW (ref 39.0–52.0)
Hemoglobin: 12.4 g/dL — ABNORMAL LOW (ref 13.0–17.0)
Lymphocytes Relative: 21 %
Lymphs Abs: 1.7 10*3/uL (ref 0.7–4.0)
MCH: 29.2 pg (ref 26.0–34.0)
MCHC: 34.7 g/dL (ref 30.0–36.0)
MCV: 84 fL (ref 78.0–100.0)
Monocytes Absolute: 0.9 10*3/uL (ref 0.1–1.0)
Monocytes Relative: 11 %
Neutro Abs: 4.8 10*3/uL (ref 1.7–7.7)
Neutrophils Relative %: 59 %
Platelets: 339 10*3/uL (ref 150–400)
RBC: 4.25 MIL/uL (ref 4.22–5.81)
RDW: 15.3 % (ref 11.5–15.5)
WBC: 8.1 10*3/uL (ref 4.0–10.5)

## 2016-04-05 LAB — BASIC METABOLIC PANEL
Anion gap: 7 (ref 5–15)
BUN: 7 mg/dL (ref 6–20)
CO2: 22 mmol/L (ref 22–32)
Calcium: 8.9 mg/dL (ref 8.9–10.3)
Chloride: 106 mmol/L (ref 101–111)
Creatinine, Ser: 0.96 mg/dL (ref 0.61–1.24)
GFR calc Af Amer: >60 mL/min (ref >60)
GFR calc non Af Amer: >60 mL/min (ref >60)
Glucose, Bld: 139 mg/dL — ABNORMAL HIGH (ref 65–99)
Potassium: 3.9 mmol/L (ref 3.5–5.1)
Sodium: 135 mmol/L (ref 135–145)

## 2016-04-05 LAB — URINALYSIS, ROUTINE W REFLEX MICROSCOPIC
Glucose, UA: NEGATIVE mg/dL
Hgb urine dipstick: NEGATIVE
Leukocytes, UA: NEGATIVE
Nitrite: NEGATIVE
Protein, ur: 30 mg/dL — AB
Specific Gravity, Urine: >1.03 — ABNORMAL HIGH (ref 1.005–1.030)
pH: 6 (ref 5.0–8.0)

## 2016-04-05 MED ORDER — SODIUM CHLORIDE 0.9 % IV BOLUS (SEPSIS)
1000.0000 mL | Freq: Once | INTRAVENOUS | Status: AC
  Administered 2016-04-05: 1000 mL via INTRAVENOUS

## 2016-04-05 NOTE — ED Notes (Signed)
In and out cath only produced about 32m of urine.

## 2016-04-05 NOTE — ED Triage Notes (Signed)
PT states he hasn't urinated since 04/01/16 and states feels the urgency but unable to go. PT states recent hospitalization for dehydration.

## 2016-04-08 ENCOUNTER — Telehealth: Payer: Self-pay | Admitting: Gastroenterology

## 2016-04-08 LAB — VITAMIN D 25 HYDROXY (VIT D DEFICIENCY, FRACTURES): Vit D, 25-Hydroxy: 12 ng/mL — ABNORMAL LOW (ref 30–100)

## 2016-04-08 LAB — HEPATITIS A ANTIBODY, TOTAL: Hep A Total Ab: REACTIVE — AB

## 2016-04-08 LAB — HEPATITIS B SURFACE ANTIBODY,QUALITATIVE: Hep B S Ab: POSITIVE — AB

## 2016-04-08 MED ORDER — VITAMIN D 1000 UNITS PO TABS: 1000.0000 [IU] | ORAL_TABLET | Freq: Every day | ORAL | 11 refills | Status: AC

## 2016-04-08 MED ORDER — CALCIUM CARBONATE ANTACID 500 MG PO CHEW: 1.0000 | CHEWABLE_TABLET | Freq: Three times a day (TID) | ORAL | 11 refills | Status: DC

## 2016-04-08 MED ORDER — VITAMIN D (ERGOCALCIFEROL) 1.25 MG (50000 UNIT) PO CAPS: 50000.0000 [IU] | ORAL_CAPSULE | ORAL | 0 refills | Status: DC

## 2016-04-08 NOTE — Telephone Encounter (Signed)
PT IS IMMUNE TO HEPATITIS A AND B. HIS VITAMIN D LEVEL IS LOW. HE NEED TO TAKE VITAMIN D AND CALCIUM EVERY DAY. THEY ARE OTC. SENT A PRESCRIPTION TO PHARMACY JUST IN CASE THEY ARE COVERED BY  INSURANCE.

## 2016-04-08 NOTE — Telephone Encounter (Signed)
PT's mom is aware.

## 2016-04-08 NOTE — Telephone Encounter (Signed)
LMOM to call.

## 2016-04-08 NOTE — Telephone Encounter (Signed)
Pt's mother called to speak with DS. Please call [phone]

## 2016-04-08 NOTE — Telephone Encounter (Signed)
See separate note, mom is aware.

## 2016-04-09 ENCOUNTER — Ambulatory Visit (INDEPENDENT_AMBULATORY_CARE_PROVIDER_SITE_OTHER): Payer: Medicaid Other | Admitting: Gastroenterology

## 2016-04-09 ENCOUNTER — Encounter (HOSPITAL_COMMUNITY): Payer: Self-pay

## 2016-04-09 ENCOUNTER — Encounter: Payer: Self-pay | Admitting: Gastroenterology

## 2016-04-09 ENCOUNTER — Inpatient Hospital Stay (HOSPITAL_COMMUNITY)
Admission: AD | Admit: 2016-04-09 | Discharge: 2016-04-11 | DRG: 387 | Disposition: A | Discharge status: Home / Self Care | Payer: Medicaid Other | Source: Ambulatory Visit | Attending: Internal Medicine | Admitting: Internal Medicine

## 2016-04-09 DIAGNOSIS — Z825 Family history of asthma and other chronic lower respiratory diseases: Secondary | ICD-10-CM

## 2016-04-09 DIAGNOSIS — F319 Bipolar disorder, unspecified: Secondary | ICD-10-CM | POA: Diagnosis present

## 2016-04-09 DIAGNOSIS — Z79899 Other long term (current) drug therapy: Secondary | ICD-10-CM

## 2016-04-09 DIAGNOSIS — A0472 Enterocolitis due to Clostridium difficile, not specified as recurrent: Secondary | ICD-10-CM | POA: Diagnosis not present

## 2016-04-09 DIAGNOSIS — E860 Dehydration: Secondary | ICD-10-CM

## 2016-04-09 DIAGNOSIS — R1084 Generalized abdominal pain: Secondary | ICD-10-CM

## 2016-04-09 DIAGNOSIS — Z833 Family history of diabetes mellitus: Secondary | ICD-10-CM | POA: Diagnosis not present

## 2016-04-09 DIAGNOSIS — K5190 Ulcerative colitis, unspecified, without complications: Secondary | ICD-10-CM | POA: Diagnosis present

## 2016-04-09 DIAGNOSIS — Z818 Family history of other mental and behavioral disorders: Secondary | ICD-10-CM

## 2016-04-09 DIAGNOSIS — K51818 Other ulcerative colitis with other complication: Secondary | ICD-10-CM | POA: Diagnosis not present

## 2016-04-09 DIAGNOSIS — Z87891 Personal history of nicotine dependence: Secondary | ICD-10-CM | POA: Diagnosis not present

## 2016-04-09 DIAGNOSIS — F3112 Bipolar disorder, current episode manic without psychotic features, moderate: Secondary | ICD-10-CM | POA: Diagnosis present

## 2016-04-09 DIAGNOSIS — J45909 Unspecified asthma, uncomplicated: Secondary | ICD-10-CM | POA: Diagnosis present

## 2016-04-09 DIAGNOSIS — H9190 Unspecified hearing loss, unspecified ear: Secondary | ICD-10-CM | POA: Diagnosis present

## 2016-04-09 DIAGNOSIS — R109 Unspecified abdominal pain: Secondary | ICD-10-CM | POA: Diagnosis present

## 2016-04-09 DIAGNOSIS — K51919 Ulcerative colitis, unspecified with unspecified complications: Secondary | ICD-10-CM | POA: Diagnosis present

## 2016-04-09 LAB — CBC
HCT: 34.3 % — ABNORMAL LOW (ref 39.0–52.0)
Hemoglobin: 11.5 g/dL — ABNORMAL LOW (ref 13.0–17.0)
MCH: 28.3 pg (ref 26.0–34.0)
MCHC: 33.5 g/dL (ref 30.0–36.0)
MCV: 84.5 fL (ref 78.0–100.0)
Platelets: 335 10*3/uL (ref 150–400)
RBC: 4.06 MIL/uL — ABNORMAL LOW (ref 4.22–5.81)
RDW: 14.9 % (ref 11.5–15.5)
WBC: 7.8 10*3/uL (ref 4.0–10.5)

## 2016-04-09 LAB — COMPREHENSIVE METABOLIC PANEL
ALT: 10 U/L — ABNORMAL LOW (ref 17–63)
AST: 14 U/L — ABNORMAL LOW (ref 15–41)
Albumin: 2.9 g/dL — ABNORMAL LOW (ref 3.5–5.0)
Alkaline Phosphatase: 46 U/L (ref 38–126)
Anion gap: 6 (ref 5–15)
BUN: 5 mg/dL — ABNORMAL LOW (ref 6–20)
CO2: 26 mmol/L (ref 22–32)
Calcium: 9 mg/dL (ref 8.9–10.3)
Chloride: 104 mmol/L (ref 101–111)
Creatinine, Ser: 0.79 mg/dL (ref 0.61–1.24)
GFR calc Af Amer: >60 mL/min (ref >60)
GFR calc non Af Amer: >60 mL/min (ref >60)
Glucose, Bld: 95 mg/dL (ref 65–99)
Potassium: 4 mmol/L (ref 3.5–5.1)
Sodium: 136 mmol/L (ref 135–145)
Total Bilirubin: 0.4 mg/dL (ref 0.3–1.2)
Total Protein: 6.7 g/dL (ref 6.5–8.1)

## 2016-04-09 LAB — C DIFFICILE QUICK SCREEN W PCR REFLEX
C Diff antigen: NEGATIVE
C Diff interpretation: NOT DETECTED
C Diff toxin: NEGATIVE

## 2016-04-09 MED ORDER — SODIUM CHLORIDE 0.9 % IV SOLN: 5.0000 mg/kg | Freq: Once | INTRAVENOUS | Status: DC

## 2016-04-09 MED ORDER — TRAZODONE HCL 50 MG PO TABS
100.0000 mg | ORAL_TABLET | Freq: Every day | ORAL | Status: DC
  Administered 2016-04-09 – 2016-04-10 (×2): 100 mg via ORAL
  Filled 2016-04-09 (×2): qty 2

## 2016-04-09 MED ORDER — SODIUM CHLORIDE 0.9 % IV SOLN
INTRAVENOUS | Status: DC
  Administered 2016-04-09 – 2016-04-11 (×3): via INTRAVENOUS

## 2016-04-09 MED ORDER — ACETAMINOPHEN 650 MG RE SUPP: 650.0000 mg | Freq: Four times a day (QID) | RECTAL | Status: DC | PRN

## 2016-04-09 MED ORDER — VITAMIN D 1000 UNITS PO TABS
1000.0000 [IU] | ORAL_TABLET | Freq: Every day | ORAL | Status: DC
  Administered 2016-04-09 – 2016-04-11 (×3): 1000 [IU] via ORAL
  Filled 2016-04-09 (×3): qty 1

## 2016-04-09 MED ORDER — ACETAMINOPHEN 325 MG PO TABS
650.0000 mg | ORAL_TABLET | Freq: Four times a day (QID) | ORAL | Status: DC | PRN
  Filled 2016-04-09: qty 2

## 2016-04-09 MED ORDER — MONTELUKAST SODIUM 10 MG PO TABS
10.0000 mg | ORAL_TABLET | Freq: Every day | ORAL | Status: DC
Start: 2016-04-09 — End: 2016-04-11
  Administered 2016-04-09 – 2016-04-10 (×2): 10 mg via ORAL
  Filled 2016-04-09 (×2): qty 1

## 2016-04-09 MED ORDER — MORPHINE SULFATE (PF) 2 MG/ML IV SOLN
1.0000 mg | INTRAVENOUS | Status: DC | PRN
  Administered 2016-04-09 – 2016-04-11 (×6): 1 mg via INTRAVENOUS
  Filled 2016-04-09 (×6): qty 1

## 2016-04-09 MED ORDER — METRONIDAZOLE IN NACL 5-0.79 MG/ML-% IV SOLN
500.0000 mg | Freq: Three times a day (TID) | INTRAVENOUS | Status: DC
  Administered 2016-04-09 – 2016-04-10 (×3): 500 mg via INTRAVENOUS
  Filled 2016-04-09 (×3): qty 100

## 2016-04-09 MED ORDER — SODIUM CHLORIDE 0.9 % IV SOLN
INTRAVENOUS | Status: DC
  Administered 2016-04-10: 04:00:00 via INTRAVENOUS

## 2016-04-09 MED ORDER — VITAMIN D (ERGOCALCIFEROL) 1.25 MG (50000 UNIT) PO CAPS: 50000.0000 [IU] | ORAL_CAPSULE | ORAL | Status: DC

## 2016-04-09 MED ORDER — ATOMOXETINE HCL 40 MG PO CAPS
120.0000 mg | ORAL_CAPSULE | Freq: Every day | ORAL | Status: DC
  Administered 2016-04-10 – 2016-04-11 (×2): 120 mg via ORAL
  Filled 2016-04-09 (×5): qty 3

## 2016-04-09 MED ORDER — ENSURE ENLIVE PO LIQD: 237.0000 mL | Freq: Two times a day (BID) | ORAL | Status: DC

## 2016-04-09 MED ORDER — ENOXAPARIN SODIUM 40 MG/0.4ML ~~LOC~~ SOLN
40.0000 mg | SUBCUTANEOUS | Status: DC
  Administered 2016-04-09 – 2016-04-10 (×2): 40 mg via SUBCUTANEOUS
  Filled 2016-04-09 (×2): qty 0.4

## 2016-04-09 MED ORDER — SACCHAROMYCES BOULARDII 250 MG PO CAPS
250.0000 mg | ORAL_CAPSULE | Freq: Every day | ORAL | Status: DC
  Administered 2016-04-09 – 2016-04-10 (×2): 250 mg via ORAL
  Filled 2016-04-09 (×2): qty 1

## 2016-04-09 MED ORDER — ATOMOXETINE HCL 25 MG PO CAPS: 25.0000 mg | ORAL_CAPSULE | Freq: Every day | ORAL | Status: DC

## 2016-04-09 MED ORDER — ONDANSETRON HCL 4 MG PO TABS
4.0000 mg | ORAL_TABLET | Freq: Four times a day (QID) | ORAL | Status: DC | PRN
  Administered 2016-04-09 – 2016-04-11 (×2): 4 mg via ORAL
  Filled 2016-04-09 (×2): qty 1

## 2016-04-09 MED ORDER — LORATADINE 10 MG PO TABS
10.0000 mg | ORAL_TABLET | Freq: Every day | ORAL | Status: DC
Start: 2016-04-09 — End: 2016-04-11
  Administered 2016-04-09 – 2016-04-10 (×2): 10 mg via ORAL
  Filled 2016-04-09 (×2): qty 1

## 2016-04-09 MED ORDER — DIVALPROEX SODIUM 250 MG PO DR TAB
500.0000 mg | DELAYED_RELEASE_TABLET | Freq: Two times a day (BID) | ORAL | Status: DC
  Administered 2016-04-09 – 2016-04-11 (×4): 500 mg via ORAL
  Filled 2016-04-09 (×4): qty 2

## 2016-04-09 MED ORDER — VANCOMYCIN 50 MG/ML ORAL SOLUTION
250.0000 mg | Freq: Four times a day (QID) | ORAL | Status: DC
  Administered 2016-04-09 (×2): 250 mg via ORAL
  Filled 2016-04-09 (×8): qty 5

## 2016-04-09 MED ORDER — MOMETASONE FURO-FORMOTEROL FUM 200-5 MCG/ACT IN AERO
2.0000 | INHALATION_SPRAY | Freq: Two times a day (BID) | RESPIRATORY_TRACT | Status: DC
  Administered 2016-04-09 – 2016-04-11 (×4): 2 via RESPIRATORY_TRACT
  Filled 2016-04-09: qty 8.8

## 2016-04-09 MED ORDER — ONDANSETRON HCL 4 MG/2ML IJ SOLN
4.0000 mg | Freq: Four times a day (QID) | INTRAMUSCULAR | Status: DC | PRN
  Administered 2016-04-09: 4 mg via INTRAVENOUS
  Filled 2016-04-09: qty 2

## 2016-04-09 MED ORDER — CALCIUM CARBONATE ANTACID 500 MG PO CHEW
1.0000 | CHEWABLE_TABLET | Freq: Three times a day (TID) | ORAL | Status: DC
  Administered 2016-04-09 – 2016-04-11 (×6): 200 mg via ORAL
  Filled 2016-04-09 (×6): qty 1

## 2016-04-09 MED ORDER — SERTRALINE HCL 50 MG PO TABS
100.0000 mg | ORAL_TABLET | Freq: Every day | ORAL | Status: DC
Start: 2016-04-10 — End: 2016-04-11
  Administered 2016-04-10 – 2016-04-11 (×2): 100 mg via ORAL
  Filled 2016-04-09 (×2): qty 2

## 2016-04-09 MED ORDER — ALBUTEROL SULFATE (2.5 MG/3ML) 0.083% IN NEBU: 2.5000 mg | INHALATION_SOLUTION | Freq: Four times a day (QID) | RESPIRATORY_TRACT | Status: DC | PRN

## 2016-04-09 MED ORDER — VANCOMYCIN 50 MG/ML ORAL SOLUTION
125.0000 mg | Freq: Four times a day (QID) | ORAL | Status: DC
  Filled 2016-04-09 (×9): qty 2.5

## 2016-04-09 NOTE — Patient Instructions (Signed)
WE WILL SCHEDULE FOLLOW UP AFTER [facility] STAY.

## 2016-04-09 NOTE — Assessment & Plan Note (Addendum)
ACUTE WORSENING OF SYMPTOMS OVER PAST 24 HOURS. WAS TO BEGIN TAPER TODAY. FAILED FLAGYL. C DIFF POS AUG 2017 AND OCT 2017. NO ACUTE ABDOMEN ON EXAM.   ADMIT FOR HYDRATION AND IV FLAGYL/VANCOMYCIN 250 MG QID.  RECHECK C DIFF PCR 7 ROUTINE STOL CULTURE. WILL DISCUSS CASE WITH DR. [place]. PT MAY NEED TOTAL COLECTOMY/IPAA OR FECAL TRANSPLANT. HOLD REMICADE. CONTROL PAIN AND NAUSEA NPO EXCEPT ICE CHIPS CMP/CBC TODAY.  ATTEMPT TO DIRECT ADMIT. AWAITING CALL FROM [phone].

## 2016-04-09 NOTE — Progress Notes (Signed)
CC'D TO PCP °

## 2016-04-09 NOTE — H&P (Signed)
History and Physical    Wayne Avila ASN:[id number] DOB: 1990-07-07 DOA: 04/09/2016  Referring MD/NP/PA: Dr. [name] PCP: [name], MD  Patient coming from: Dr. [name]' office  Chief Complaint: Increased abdominal pain, watery diarrhea, nausea  HPI: Wayne Avila is a 25 y.o. male with a h/o ulcerative colitis, asthma and bipolar recently hospitalized with c diff colitis. He had finished his 2 weeks of vancomycin and for the past 36 hours has noticed increased abdominal pain, recurrence of watery diarrhea (>10 stools today) and vomiting. Went to see Dr. [name] today and was sent to the hospital for IVF and a repeat course of vancomycin. No labs available at time of admission.  Past Medical/Surgical History: Past Medical History:  Diagnosis Date  . Abdominal pain, chronic, right upper quadrant   . ADHD (attention deficit hyperactivity disorder)   . Anxiety   . Asthma   . Bipolar 1 disorder ([place])   . Colitis   . Depression   . GERD (gastroesophageal reflux disease)   . Hearing loss   . HOH (hard of hearing)   . Mental developmental delay 11/15/2012  . PONV (postoperative nausea and vomiting)   . Seizures ([place])     Past Surgical History:  Procedure Laterality Date  . ADENOIDECTOMY    . BIOPSY N/A 12/07/2012   Procedure: GASTRIC BIOPSIES;  Surgeon: [name], MD;  Location: [facility];  Service: Endoscopy;  Laterality: N/A;  . BIOPSY N/A 08/30/2013   Procedure: BIOPSY;  Surgeon: [name], MD;  Location: [facility];  Service: Endoscopy;  Laterality: N/A;  right colon,transverse colon, left colon, rectal biopsies  . BIOPSY  11/20/2015   Procedure: BIOPSY;  Surgeon: [name], MD;  Location: [facility];  Service: Endoscopy;;  ileal, right colon biopsy, left colon, rectum  . COLONOSCOPY  11/20/2015   Dr. [name]: Severe erythema, edema, ulcers from the anal verge to 20 cm above the anal verge without mucosal sparing, remaining colon and terminal ileum appeared normal.  Biopsies from the rectum revealed fulminant active chronic colitis consistent with IBD. Pathology from terminal ileum revealed intramucosal lymphoid aggregates. Remaining colon random biopsies with inactive chronic colitis  . COLONOSCOPY WITH PROPOFOL N/A 08/30/2013   LPF:XTKWIO mucosa in the terminal ileum/COLITIS/ MILD PROCTITIS. 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  Marland Kitchen COLONOSCOPY WITH PROPOFOL N/A 11/20/2015   Dr. 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 ESOPHAGOGASTRODUODENOSCOPY  11/20/2015   Dr. [name]: Normal exam, stomach biopsied and duodenal biopsy.. Benign biopsies.  . ESOPHAGOGASTRODUODENOSCOPY (EGD) WITH PROPOFOL N/A 12/07/2012   SLF:The mucosa of the esophagus appeared normal Non-erosive gastritis (inflammation) was found in the gastric antrum; multiple biopsies The duodenal mucosa showed no abnormalities in the bulb and second portion of the duodenum  . ESOPHAGOGASTRODUODENOSCOPY (EGD) WITH PROPOFOL N/A 11/20/2015   Dr. [name]: normal with normal biopsies, negative H.pylori   . TONSILLECTOMY      Social History:  reports that he quit smoking about 3 years ago. He smoked 0.00 packs per day for 2.00 years. He has never used smokeless tobacco. He reports that he does not drink alcohol or use drugs.  Allergies: Allergies  Allergen Reactions  . Amoxicillin-Pot Clavulanate Nausea And Vomiting and Other (See Comments)    Has patient had a PCN reaction causing immediate rash, facial/tongue/throat swelling, SOB or lightheadedness with hypotension: No Has patient had a  PCN reaction causing severe rash involving mucus membranes or skin necrosis: No Has patient had a PCN reaction that required hospitalization No Has patient had a PCN reaction occurring within the last 10 years: No If all of the above answers are "NO", then may proceed with  Cephalosporin use.  . Ibuprofen Other (See Comments)    Per pts MD he is unable to take this medication.   . Omeprazole Nausea And Vomiting  . Pineapple Swelling and Other (See Comments)    Reaction:  Lip swelling  . Strawberry Extract Swelling and Other (See Comments)    Reaction:  Lip swelling  . Tomato Rash    Family History:  Family History  Problem Relation Age of Onset  . Asthma Mother   . Ulcers Mother   . Bipolar disorder Mother   . ADD / ADHD Father   . Diabetes Maternal Grandmother   . Diabetes Maternal Grandfather   . Colon cancer Neg Hx   . Liver disease Neg Hx     Prior to Admission medications   Medication Sig Start Date End Date Taking? Authorizing Provider  albuterol (PROVENTIL HFA;VENTOLIN HFA) 108 (90 Base) MCG/ACT inhaler Inhale 2 puffs into the lungs every 4 (four) hours as needed for wheezing or shortness of breath (and/or cough).   Yes Historical Provider, MD  albuterol (PROVENTIL) (2.5 MG/3ML) 0.083% nebulizer solution Inhale 2.5 mg into the lungs every 6 (six) hours as needed for wheezing or shortness of breath.    Yes Historical Provider, MD  atomoxetine (STRATTERA) 100 MG capsule Take 100 mg by mouth daily. Pt takes with a 15m capsule.   Yes Historical Provider, MD  atomoxetine (STRATTERA) 25 MG capsule Take 25 mg by mouth daily. Pt takes with a 1086mcapsule.   Yes Historical Provider, MD  budesonide-formoterol (SYMBICORT) 160-4.5 MCG/ACT inhaler Inhale 2 puffs into the lungs 2 (two) times daily.   Yes Historical Provider, MD  calcium carbonate (TUMS) 500 MG chewable tablet Chew 1 tablet (200 mg of elemental calcium total) by mouth 3 (three) times daily. 04/08/16  Yes SaDanie BinderMD  cholecalciferol (VITAMIN D) 1000 units tablet Take 1 tablet (1,000 Units total) by mouth daily. START AFTER VITAMIN D 50,000 U TABLETS ARE COMPLETE 04/08/16  Yes SaDanie BinderMD  divalproex (DEPAKOTE) 500 MG DR tablet Take 1 tablet (500 mg total) by mouth 2 (two) times  daily. 07/03/15  Yes ViPenni BombardMD  EPINEPHrine (EPIPEN 2-PAK) 0.3 mg/0.3 mL IJ SOAJ injection Inject 0.3 mg into the muscle once as needed (for severe allergic reaction).   Yes Historical Provider, MD  fluticasone (FLONASE) 50 MCG/ACT nasal spray Place 1-2 sprays into both nostrils daily as needed for rhinitis.   Yes Historical Provider, MD  loratadine (CLARITIN) 10 MG tablet Take 10 mg by mouth at bedtime.    Yes Historical Provider, MD  montelukast (SINGULAIR) 10 MG tablet Take 1 tablet (10 mg total) by mouth at bedtime. 11/13/14  Yes LaNiel HummerNP  saccharomyces boulardii (FLORASTOR) 250 MG capsule Take 1 capsule (250 mg total) by mouth daily at 2 PM. 03/31/16  Yes JeKathie DikeMD  sertraline (ZOLOFT) 100 MG tablet Take 1 tablet (100 mg total) by mouth daily. 11/13/14  Yes LaNiel HummerNP  traMADol (ULTRAM) 50 MG tablet Take 100 mg by mouth every 6 (six) hours as needed for moderate pain (headache pain).    Yes Historical Provider, MD  traZODone (DESYREL) 100 MG tablet Take 100  mg by mouth at bedtime.   Yes Historical Provider, MD  vancomycin (VANCOCIN) 50 mg/mL oral solution Take 243m po qid for 1 week then daily for 7 days, every other day for 7 days, then every 3 days for 14 days 03/31/16  Yes JKathie Dike MD  Vitamin D, Ergocalciferol, (DRISDOL) 50000 units CAPS capsule Take 1 capsule (50,000 Units total) by mouth every 7 (seven) days. 04/08/16  Yes SDanie Binder MD  HYDROcodone-acetaminophen (NORCO/VICODIN) 5-325 MG tablet 1 or 2 tabs PO q6 hours prn pain Patient not taking: Reported on 04/09/2016 03/01/16   KFrancine Graven DO  ondansetron (ZOFRAN ODT) 8 MG disintegrating tablet Take 1 tablet (8 mg total) by mouth every 8 (eight) hours as needed for nausea or vomiting. Patient not taking: Reported on 04/09/2016 12/25/15   KJola Schmidt MD  promethazine (PHENERGAN) 12.5 MG tablet 1 PO BID. MAY USE ADDITIONAL DOSE EVERY 4 HOURS. NO MORE THAN 8 DOSES A DAY. Patient not taking:  Reported on 04/09/2016 03/18/16   SDanie Binder MD    Review of Systems:  Constitutional: Denies fever, chills, diaphoresis, appetite change and fatigue.  HEENT: Denies photophobia, eye pain, redness, hearing loss, ear pain, congestion, sore throat, rhinorrhea, sneezing, mouth sores, trouble swallowing, neck pain, neck stiffness and tinnitus.   Respiratory: Denies SOB, DOE, cough, chest tightness,  and wheezing.   Cardiovascular: Denies chest pain, palpitations and leg swelling.  Gastrointestinal: Denies , constipation, blood in stool and abdominal distention.  Genitourinary: Denies dysuria, urgency, frequency, hematuria, flank pain and difficulty urinating.  Endocrine: Denies: hot or cold intolerance, sweats, changes in hair or nails, polyuria, polydipsia. Musculoskeletal: Denies myalgias, back pain, joint swelling, arthralgias and gait problem.  Skin: Denies pallor, rash and wound.  Neurological: Denies dizziness, seizures, syncope, weakness, light-headedness, numbness and headaches.  Hematological: Denies adenopathy. Easy bruising, personal or family bleeding history  Psychiatric/Behavioral: Denies suicidal ideation, mood changes, confusion, nervousness, sleep disturbance and agitation    Physical Exam: Vitals:   04/09/16 1248  BP: 127/86  Pulse: (!) 110  Resp: 18  Temp: 98.7 F (37.1 C)  TempSrc: Oral  SpO2: 100%  Weight: 98.9 kg (218 lb)  Height: 5' 5"  (1.651 m)     Constitutional: NAD, calm, in distress due to abdominal pain. Eyes: PERRL, lids and conjunctivae normal ENMT: Mucous membranes are moist. Posterior pharynx clear of any exudate or lesions.Normal dentition.  Neck: normal, supple, no masses, no thyromegaly Respiratory: clear to auscultation bilaterally, no wheezing, no crackles. Normal respiratory effort. No accessory muscle use.  Cardiovascular: Regular rate and rhythm, no murmurs / rubs / gallops. No extremity edema. 2+ pedal pulses. No carotid bruits.    Abdomen: diffuse tenderness, no masses palpated. No hepatosplenomegaly. Bowel sounds hyperactive Musculoskeletal: no clubbing / cyanosis. No joint deformity upper and lower extremities. Good ROM, no contractures. Normal muscle tone.  Skin: no rashes, lesions, ulcers. No induration Neurologic: CN 2-12 grossly intact. Sensation intact, DTR normal. Strength 5/5 in all 4.  Psychiatric: Normal judgment and insight. Alert and oriented x 3. Normal mood.    Labs on Admission: I have personally reviewed the following labs and imaging studies  CBC:  Recent Labs Lab 04/05/16 1714  WBC 8.1  NEUTROABS 4.8  HGB 12.4*  HCT 35.7*  MCV 84.0  PLT 3945  Basic Metabolic Panel:  Recent Labs Lab 04/05/16 1714  NA 135  K 3.9  CL 106  CO2 22  GLUCOSE 139*  BUN 7  CREATININE 0.96  CALCIUM  8.9   GFR: Estimated Creatinine Clearance: 129.5 mL/min (by C-G formula based on SCr of 0.96 mg/dL). Liver Function Tests: No results for input(s): AST, ALT, ALKPHOS, BILITOT, PROT, ALBUMIN in the last 168 hours. No results for input(s): LIPASE, AMYLASE in the last 168 hours. No results for input(s): AMMONIA in the last 168 hours. Coagulation Profile: No results for input(s): INR, PROTIME in the last 168 hours. Cardiac Enzymes: No results for input(s): CKTOTAL, CKMB, CKMBINDEX, TROPONINI in the last 168 hours. BNP (last 3 results) No results for input(s): PROBNP in the last 8760 hours. HbA1C: No results for input(s): HGBA1C in the last 72 hours. CBG: No results for input(s): GLUCAP in the last 168 hours. Lipid Profile: No results for input(s): CHOL, HDL, LDLCALC, TRIG, CHOLHDL, LDLDIRECT in the last 72 hours. Thyroid Function Tests: No results for input(s): TSH, T4TOTAL, FREET4, T3FREE, THYROIDAB in the last 72 hours. Anemia Panel: No results for input(s): VITAMINB12, FOLATE, FERRITIN, TIBC, IRON, RETICCTPCT in the last 72 hours. Urine analysis:    Component Value Date/Time   COLORURINE YELLOW  04/05/2016 [place] 04/05/2016 1714   LABSPEC >1.030 (H) 04/05/2016 1714   PHURINE 6.0 04/05/2016 1714   GLUCOSEU NEGATIVE 04/05/2016 1714   HGBUR NEGATIVE 04/05/2016 1714   BILIRUBINUR SMALL (A) 04/05/2016 1714   KETONESUR TRACE (A) 04/05/2016 1714   PROTEINUR 30 (A) 04/05/2016 1714   UROBILINOGEN 0.2 09/04/2014 2355   NITRITE NEGATIVE 04/05/2016 1714   LEUKOCYTESUR NEGATIVE 04/05/2016 1714   Sepsis Labs: @LABRCNTIP (procalcitonin:4,lacticidven:4) )No results found for this or any previous visit (from the past 240 hour(s)).   Radiological Exams on Admission: No results found.  EKG: Independently reviewed. None obtained prior to admission  Assessment/Plan Principal Problem:   C. 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 -Will request sample to be sent for c diff. -Will place on QID PO vanc as per c diff order set for first recurrent episode without severe disease. -GI consultation requested. May need referral tertiary care as an OP for potential fecal transplant.  UC -Supposed to receive remicade tomorrow. -GI to determine whether feasible to give at present with possible active c diff colitis.  Dehydration -Labs pending. -Due to profuse diarrhea. -Start IVF.  Bipolar Disorder -Continue psychotropic meds.   DVT prophylaxis: Lovenox  Code Status: full code  Family Communication: mother and grandmother at bedside updated on plan of care  Disposition Plan: to be determined; home when ready  Consults called: GI  Admission status: Inpatient    Time Spent: 70 minutes  Lelon Frohlich MD [facility] Pager [phone]  If 7PM-7AM, please contact night-coverage [email or web address] Password [email or web address]  04/09/2016, 3:35 PM

## 2016-04-09 NOTE — Progress Notes (Signed)
Subjective:    Patient ID: Wayne Avila, male    DOB: [birth year], 25 y.o.   MRN: [id number]  [name], MD  HPI WAS TAKING QID VANCOMYCIN. FEELING BETTER. STOOLS WITH MORE FORM BUT NOT NORMAL. WATERY STOOL STARTED AGAIN YESTERDAY(> 10, WATERY ). WAS GETTING BACK TO A REGULAR DIET BUT NAUSEA WORSE YESTERDAY AND VOMITING STARTED THIS AM. UNABLE TO KEEP DOWN PHENERGAN OR ZOFRAN. BEGAN TO HAVE NO BLOOD IN STOLL. TEMP YESTERDAY 100.73F. JUST BEE SWEATING. STOMACH PAIN HURTING MORE: FOR PAST WEEK, PAIN BAD TODAY. ABDOMINAL PAIN HURTS ALL OVER. CRAMPING. NO TOBACCO PRODUCT OR NO ASPIRIN, BC/GOODY POWDERS, IBUPROFEN/MOTRIN, OR NAPROXEN/ALEVE. NO SORES IN MOUTH, RASH ON LEGS, JOINT PAIN, OR BACK PAIN. REMICADE DUE TOMORROW.   PT DENIES HEMATOCHEZIA, HEMATEMESIS, melena, CHEST PAIN, SHORTNESS OF BREATH, CHANGE IN BOWEL IN HABITS, constipation, problems swallowing, OR heartburn or indigestion.  Past Medical History:  Diagnosis Date  . Abdominal pain, chronic, right upper quadrant   . ADHD (attention deficit hyperactivity disorder)   . Anxiety   . Asthma   . Bipolar 1 disorder ([place])   . Colitis   . Depression   . GERD (gastroesophageal reflux disease)   . Hearing loss   . HOH (hard of hearing)   . Mental developmental delay 11/15/2012  . PONV (postoperative nausea and vomiting)   . Seizures ([place])     Past Surgical History:  Procedure Laterality Date  . ADENOIDECTOMY    . BIOPSY N/A 12/07/2012   Procedure: GASTRIC BIOPSIES;  Surgeon: [name], MD;  Location: [facility];  Service: Endoscopy;  Laterality: N/A;  . BIOPSY N/A 08/30/2013   Procedure: BIOPSY;  Surgeon: [name], MD;  Location: [facility];  Service: Endoscopy;  Laterality: N/A;  right colon,transverse colon, left colon, rectal biopsies  . BIOPSY  11/20/2015   Procedure: BIOPSY;  Surgeon: [name], MD;  [facility];  Service: Endoscopy;;  ileal, right colon biopsy, left colon, rectum  . COLONOSCOPY  11/20/2015    Dr. [name]: Severe erythema, edema, ulcers from the anal verge to 20 cm above the anal verge without mucosal sparing, remaining colon and terminal ileum appeared normal. Biopsies from the rectum revealed fulminant active chronic colitis consistent with IBD. Pathology from terminal ileum revealed intramucosal lymphoid aggregates. Remaining colon random biopsies with inactive chronic colitis  . COLONOSCOPY WITH PROPOFOL N/A 08/30/2013   DUK:GURKYH mucosa in the terminal ileum/COLITIS/ MILD PROCTITIS. 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  Marland Kitchen COLONOSCOPY WITH PROPOFOL N/A 11/20/2015   Dr. 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 ESOPHAGOGASTRODUODENOSCOPY  11/20/2015   Dr. [name]: Normal exam, stomach biopsied and duodenal biopsy.. Benign biopsies.  . ESOPHAGOGASTRODUODENOSCOPY (EGD) WITH PROPOFOL N/A 12/07/2012   SLF:The mucosa of the esophagus appeared normal Non-erosive gastritis (inflammation) was found in the gastric antrum; multiple biopsies The duodenal mucosa showed no abnormalities in the bulb and second portion of the duodenum  . ESOPHAGOGASTRODUODENOSCOPY (EGD) WITH PROPOFOL N/A 11/20/2015   Dr. [name]: normal with normal biopsies, negative H.pylori   . TONSILLECTOMY      Allergies  Allergen Reactions  . Amoxicillin-Pot Clavulanate Nausea And Vomiting and Other (See Comments)      . Ibuprofen Other (See Comments)    Per pts MD he is unable to take this medication.   . Omeprazole Nausea And Vomiting  . Pineapple Swelling  and Other (See Comments)    Reaction:  Lip swelling  . Strawberry Extract Swelling and Other (See Comments)    Reaction:  Lip swelling  . Tomato Rash    Current Outpatient Prescriptions  Medication Sig Dispense Refill  . albuterol (PROVENTIL HFA;VENTOLIN HFA) 108 (90 Base) MCG/ACT inhaler Inhale 2 puffs into the  lungs every 4 (four) hours as needed for wheezing or shortness of breath (and/or cough).    Marland Kitchen albuterol (PROVENTIL) (2.5 MG/3ML) 0.083% nebulizer solution Inhale 2.5 mg into the lungs every 6 (six) hours as needed for wheezing or shortness of breath.     Marland Kitchen atomoxetine (STRATTERA) 100 MG capsule Take 100 mg by mouth daily. Pt takes with a 55m capsule.    .Marland Kitchenatomoxetine (STRATTERA) 25 MG capsule Take 25 mg by mouth daily. Pt takes with a 1036mcapsule.    . budesonide-formoterol (SYMBICORT) 160-4.5 MCG/ACT inhaler Inhale 2 puffs into the lungs 2 (two) times daily.    . calcium carbonate (TUMS) 500 MG chewable tablet Chew 1 tablet (200 mg of elemental calcium total) by mouth 3 (three) times daily.    . cholecalciferol (VITAMIN D) 1000 units tablet Take 1 tablet (1,000 Units total) by mouth daily. START AFTER VITAMIN D 50,000 U TABLETS ARE COMPLETE    . divalproex (DEPAKOTE) 500 MG DR tablet Take 1 tablet (500 mg total) by mouth 2 (two) times daily.    . Marland KitchenPINEPHrine (EPIPEN 2-PAK) 0.3 mg/0.3 mL IJ SOAJ injection Inject 0.3 mg into the muscle once as needed (for severe allergic reaction).    . fluticasone (FLONASE) 50 MCG/ACT nasal spray Place 1-2 sprays into both nostrils daily as needed for rhinitis.    . Marland Kitchenoratadine (CLARITIN) 10 MG tablet Take 10 mg by mouth at bedtime.     . montelukast (SINGULAIR) 10 MG tablet Take 1 tablet (10 mg total) by mouth at bedtime.    . promethazine (PHENERGAN) 12.5 MG tablet 1 PO BID. MAY USE ADDITIONAL DOSE EVERY 4 HOURS. NO MORE THAN 8 DOSES A DAY.     . saccharomyces boulardii (FLORASTOR) 250 MG capsule Take 1 capsule (250 mg total) by mouth daily at 2 PM.    . sertraline (ZOLOFT) 100 MG tablet Take 1 tablet (100 mg total) by mouth daily.    . traMADol (ULTRAM) 50 MG tablet Take 100 mg by mouth every 6 (six) hours as needed for moderate pain (headache pain).     . traZODone (DESYREL) 100 MG tablet Take 100 mg by mouth at bedtime.    . triamcinolone cream (KENALOG) 0.1  % Apply 1 application topically 2 (two) times daily as needed (for itching).    . vancomycin (VANCOCIN) 50 mg/mL oral solution Take 25068mo qid for 1 week then daily for 7 days, every other day for 7 days, then every 3 days for 14 days    . Vitamin D, Ergocalciferol, (DRISDOL) 50000 units CAPS capsule Take 1 capsule (50,000 Units total) by mouth every 7 (seven) days.    .      .       Review of Systems PER HPI OTHERWISE ALL SYSTEMS ARE NEGATIVE.    Objective:   Physical Exam  Constitutional: He is oriented to person, place, and time. He appears well-developed and well-nourished. He appears distressed.  EYES CLOSED DIAPHORETIC  HENT:  Head: Normocephalic and atraumatic.  Mouth/Throat: Oropharynx is clear and moist. No oropharyngeal exudate.  Eyes: Pupils are equal, round, and reactive to light. No scleral icterus.  Neck: Normal range of motion. Neck supple.  Cardiovascular: Normal rate, regular rhythm and normal heart sounds.   Pulmonary/Chest: Effort normal and breath sounds normal. No respiratory distress.  Abdominal: Soft. Bowel sounds are normal. He exhibits no distension. There is tenderness. There is no rebound and no guarding.  Diffuse X 4 quadrants soft, bowel sounds present  Musculoskeletal: He exhibits no edema.  Lymphadenopathy:    He has no cervical adenopathy.  Neurological: He is alert and oriented to person, place, and time.  NO  NEW FOCAL DEFICITs  Psychiatric: He has a normal mood and affect.  Vitals reviewed.     Assessment & Plan:

## 2016-04-09 NOTE — Progress Notes (Signed)
Discussed with Dr. [name]. Below recommendations as per her office note today, under encounters. Orders placed at her request.      "ADMIT FOR HYDRATION AND IV FLAGYL/VANCOMYCIN 250 MG QID.  RECHECK C DIFF PCR 7 ROUTINE STOL CULTURE. WILL DISCUSS CASE WITH DR. [place]. PT MAY NEED TOTAL COLECTOMY/IPAA OR FECAL TRANSPLANT. HOLD REMICADE. CONTROL PAIN AND NAUSEA NPO EXCEPT ICE CHIPS CMP/CBC TODAY."

## 2016-04-10 ENCOUNTER — Encounter (HOSPITAL_COMMUNITY): Admission: RE | Admit: 2016-04-10 | Payer: Medicaid Other | Source: Ambulatory Visit

## 2016-04-10 DIAGNOSIS — R1084 Generalized abdominal pain: Secondary | ICD-10-CM

## 2016-04-10 DIAGNOSIS — A0472 Enterocolitis due to Clostridium difficile, not specified as recurrent: Secondary | ICD-10-CM

## 2016-04-10 DIAGNOSIS — E860 Dehydration: Secondary | ICD-10-CM

## 2016-04-10 DIAGNOSIS — F3112 Bipolar disorder, current episode manic without psychotic features, moderate: Secondary | ICD-10-CM

## 2016-04-10 DIAGNOSIS — K51818 Other ulcerative colitis with other complication: Secondary | ICD-10-CM

## 2016-04-10 LAB — CBC
HCT: 30.7 % — ABNORMAL LOW (ref 39.0–52.0)
Hemoglobin: 10.3 g/dL — ABNORMAL LOW (ref 13.0–17.0)
MCH: 28.5 pg (ref 26.0–34.0)
MCHC: 33.6 g/dL (ref 30.0–36.0)
MCV: 85 fL (ref 78.0–100.0)
Platelets: 317 10*3/uL (ref 150–400)
RBC: 3.61 MIL/uL — ABNORMAL LOW (ref 4.22–5.81)
RDW: 14.9 % (ref 11.5–15.5)
WBC: 9 10*3/uL (ref 4.0–10.5)

## 2016-04-10 LAB — BASIC METABOLIC PANEL
Anion gap: 6 (ref 5–15)
BUN: 5 mg/dL — ABNORMAL LOW (ref 6–20)
CO2: 26 mmol/L (ref 22–32)
Calcium: 8.5 mg/dL — ABNORMAL LOW (ref 8.9–10.3)
Chloride: 105 mmol/L (ref 101–111)
Creatinine, Ser: 0.89 mg/dL (ref 0.61–1.24)
GFR calc Af Amer: >60 mL/min (ref >60)
GFR calc non Af Amer: >60 mL/min (ref >60)
Glucose, Bld: 103 mg/dL — ABNORMAL HIGH (ref 65–99)
Potassium: 3.9 mmol/L (ref 3.5–5.1)
Sodium: 137 mmol/L (ref 135–145)

## 2016-04-10 LAB — GASTROINTESTINAL PANEL BY PCR, STOOL (REPLACES STOOL CULTURE)

## 2016-04-10 NOTE — Progress Notes (Signed)
Subjective: Today he states he's had likely 10+ BMs since admission. His last bowel movement was just a little bit ago and seems to be soft, but not loose/watery. Abdominal pain is improved as well. No blood in his stools, no black/tarry stools. Denies N/V. No other GI complaints. Is asking when he'll be able to eat.  Objective: Vital signs in last 24 hours: Temp:  [99 F (37.2 C)-99.1 F (37.3 C)] 99.1 F (37.3 C) (11/02 3007) Pulse Rate:  [107-109] 109 (11/02 0614) Resp:  [18] 18 (11/02 0614) BP: (106-131)/(59-74) 106/59 (11/02 0614) SpO2:  [98 %-99 %] 98 % (11/02 0808) Last BM Date: 04/09/16 General:   Alert and oriented, pleasant. No acute distress. Head:  Normocephalic and atraumatic. Eyes:  No icterus, sclera clear. Conjuctiva pink.  Heart:  S1, S2 present, no murmurs noted.  Lungs: Clear to auscultation bilaterally, without wheezing, rales, or rhonchi.  Abdomen:  Bowel sounds present, rounded but soft, non-tender, non-distended. No HSM or hernias noted. No rebound or guarding. Msk:  Symmetrical without gross deformities. Extremities:  Without clubbing or edema. Neurologic:  Alert and  oriented x4;  grossly normal neurologically. Cervical Nodes:  No significant cervical adenopathy. Psych:  Alert and cooperative. Normal mood and affect.  Intake/Output from previous day: 11/01 0701 - 11/02 0700 In: 226.7 [I.V.:126.7; IV Piggyback:100] Out: -  Intake/Output this shift: No intake/output data recorded.  Lab Results:  Recent Labs  04/09/16 1557 04/10/16 0632  WBC 7.8 9.0  HGB 11.5* 10.3*  HCT 34.3* 30.7*  PLT 335 317   BMET  Recent Labs  04/09/16 1557 04/10/16 0632  NA 136 137  K 4.0 3.9  CL 104 105  CO2 26 26  GLUCOSE 95 103*  BUN 5* 5*  CREATININE 0.79 0.89  CALCIUM 9.0 8.5*   LFT  Recent Labs  04/09/16 1557  PROT 6.7  ALBUMIN 2.9*  AST 14*  ALT 10*  ALKPHOS 46  BILITOT 0.4   PT/INR No results for input(s): LABPROT, INR in the last 72  hours. Hepatitis Panel No results for input(s): HEPBSAG, HCVAB, HEPAIGM, HEPBIGM in the last 72 hours.   Studies/Results: No results found.  Assessment: 25 year old male seen in our office yesterday for follow-up on C. Diff. Tested positive by PCR 01/2016 and 03/2016. Was recently admitted for C. Diff from 03/22/16 through 03/31/16. At his follow-up visit he noted he had been feeling better on po Vanco with pulse and stools seemed to be trying to solidify but then began with watery stools again the day before (>10). Had been progressing back to a watery diet but had associated N/V yesterday morning, unable to keep down food/fluids or antiemetic medications. No blood noted in stool. Also with progressive abdominal pain, was "real bad" yesterday. Denies ASA powder or NSAITs. No oral sores noted.   Dr. [name] discussed his case of refractory C. Diff with Dr. Koleen Distance who noted may need total colectomy/IPAA or fecal transplant. 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  Recommended hold remicaid, NPO except ice chips, CBC/CMP, IV flagyl/vanco. Recheck CDiff PCR, stool culture.  His CDiff quick scan was negative yesterday afternoon. GI path panel pending. CBC with some worsening anemia hgb 11.5, CMP essentially normal. His labs today show downward trend in hgb to 10.3 and essentially normal BMP. Possible hydration effect. Flagyl/Vanco was d/c'd by hospitalist today.   Today he seems clinically improved. Significant improvement in his abdominal pain,  almost resolved. Last stool seems to be solidifying. CDiff quick scan negative, abx have been discontinued. Query if he recent bout was UC and/or post-infectious IBS related rather than refractory CDiff. Has an appointment with [facility] scheduled 11/20. Impressed the importance of making that visit.   Plan: 1. Continue to monitor for obvious GI bleed 2. Check CBC in the morning 3. Will consider  diet advancement this evening. 4. Continued supportive measures 5. Pain and nausea management per hospitalist   Thank you for allowing [place] to participate in the care of Sanjuana Kava, DNP, AGNP-C Adult & Gerontological Nurse Practitioner [facility]    LOS: 1 day    04/10/2016, 1:02 PM

## 2016-04-10 NOTE — Progress Notes (Signed)
PROGRESS NOTE    Wayne Avila  TML:[id number] DOB: 05-Jul-1990 DOA: 04/09/2016 PCP: [name], MD     Brief Narrative:  25 y/o man with UC and recent c diff colitis who completed 14 days of vanc presents from the GI office due to recurrent diarrhea, abdominal pain, n/v. R   Assessment & Plan:   Principal Problem:   C. difficile colitis Active Problems:   Bipolar 1 disorder, manic, moderate (HCC)   Ulcerative colitis ([place])   Dehydration   Generalized abdominal pain   Diarrhea/Abd pain/emesis -C Diff PCR negative; will DC vanc/flagyl. -Stool pathogen panel pending. -Unclear what the cause of diarrhea could be; maybe related to his UC? (he states he has said over 10 watery stools since admission). Nausea and abdominal pain have improved.  Bipolar Disorder -Continue psychotropic meds. -Mood stable, flat affect.   DVT prophylaxis: lovenox Code Status: full code Family Communication: mother at bedside updated on plan of care and all questions answered Disposition Plan: likely home in 24-48 hours  Consultants:   GI  Procedures:   None  Antimicrobials:   None    Subjective: Feels better, but still with copious diarrhea.  Objective: Vitals:   04/09/16 2200 04/10/16 0614 04/10/16 0808 04/10/16 1349  BP: 131/74 (!) 106/59  134/72  Pulse: (!) 107 (!) 109  98  Resp: 18 18  18   Temp: 99 F (37.2 C) 99.1 F (37.3 C)  98.9 F (37.2 C)  TempSrc: Oral Oral  Oral  SpO2: 99% 99% 98% 100%  Weight:      Height:        Intake/Output Summary (Last 24 hours) at 04/10/16 1434 Last data filed at 04/10/16 0900  Gross per 24 hour  Intake           226.67 ml  Output                0 ml  Net           226.67 ml   Filed Weights   04/09/16 1248  Weight: 98.9 kg (218 lb)    Examination:  General exam: Alert, awake, oriented x 3 Respiratory system: Clear to auscultation. Respiratory effort normal. Cardiovascular system:RRR. No murmurs, rubs,  gallops. Gastrointestinal system: Abdomen is nondistended, soft and nontender. No organomegaly or masses felt. Normal bowel sounds heard. Central nervous system: Alert and oriented. No focal neurological deficits. Extremities: No C/C/E, +pedal pulses Skin: No rashes, lesions or ulcers Psychiatry: Judgement and insight appear normal. Mood & affect appropriate.     Data Reviewed: I have personally reviewed following labs and imaging studies  CBC:  Recent Labs Lab 04/05/16 1714 04/09/16 1557 04/10/16 0632  WBC 8.1 7.8 9.0  NEUTROABS 4.8  --   --   HGB 12.4* 11.5* 10.3*  HCT 35.7* 34.3* 30.7*  MCV 84.0 84.5 85.0  PLT 339 335 681   Basic Metabolic Panel:  Recent Labs Lab 04/05/16 1714 04/09/16 1557 04/10/16 0632  NA 135 136 137  K 3.9 4.0 3.9  CL 106 104 105  CO2 22 26 26   GLUCOSE 139* 95 103*  BUN 7 5* 5*  CREATININE 0.96 0.79 0.89  CALCIUM 8.9 9.0 8.5*   GFR: Estimated Creatinine Clearance: 139.7 mL/min (by C-G formula based on SCr of 0.89 mg/dL). Liver Function Tests:  Recent Labs Lab 04/09/16 1557  AST 14*  ALT 10*  ALKPHOS 46  BILITOT 0.4  PROT 6.7  ALBUMIN 2.9*   No results for input(s):  LIPASE, AMYLASE in the last 168 hours. No results for input(s): AMMONIA in the last 168 hours. Coagulation Profile: No results for input(s): INR, PROTIME in the last 168 hours. Cardiac Enzymes: No results for input(s): CKTOTAL, CKMB, CKMBINDEX, TROPONINI in the last 168 hours. BNP (last 3 results) No results for input(s): PROBNP in the last 8760 hours. HbA1C: No results for input(s): HGBA1C in the last 72 hours. CBG: No results for input(s): GLUCAP in the last 168 hours. Lipid Profile: No results for input(s): CHOL, HDL, LDLCALC, TRIG, CHOLHDL, LDLDIRECT in the last 72 hours. Thyroid Function Tests: No results for input(s): TSH, T4TOTAL, FREET4, T3FREE, THYROIDAB in the last 72 hours. Anemia Panel: No results for input(s): VITAMINB12, FOLATE, FERRITIN, TIBC,  IRON, RETICCTPCT in the last 72 hours. Urine analysis:    Component Value Date/Time   COLORURINE YELLOW 04/05/2016 [place] 04/05/2016 1714   LABSPEC >1.030 (H) 04/05/2016 1714   PHURINE 6.0 04/05/2016 1714   GLUCOSEU NEGATIVE 04/05/2016 1714   HGBUR NEGATIVE 04/05/2016 1714   BILIRUBINUR SMALL (A) 04/05/2016 1714   KETONESUR TRACE (A) 04/05/2016 1714   PROTEINUR 30 (A) 04/05/2016 1714   UROBILINOGEN 0.2 09/04/2014 2355   NITRITE NEGATIVE 04/05/2016 1714   LEUKOCYTESUR NEGATIVE 04/05/2016 1714   Sepsis Labs: @LABRCNTIP (procalcitonin:4,lacticidven:4)  ) Recent Results (from the past 240 hour(s))  C difficile quick scan w PCR reflex     Status: None   Collection Time: 04/09/16  3:19 PM  Result Value Ref Range Status   C Diff antigen NEGATIVE NEGATIVE Final   C Diff toxin NEGATIVE NEGATIVE Final   C Diff interpretation No C. difficile detected.  Final  Gastrointestinal Panel by PCR , Stool     Status: None   Collection Time: 04/09/16  3:20 PM  Result Value Ref Range Status   Campylobacter species NOT DETECTED NOT DETECTED Final   Plesimonas shigelloides NOT DETECTED NOT DETECTED Final   Salmonella species NOT DETECTED NOT DETECTED Final   Yersinia enterocolitica NOT DETECTED NOT DETECTED Final   Vibrio species NOT DETECTED NOT DETECTED Final   Vibrio cholerae NOT DETECTED NOT DETECTED Final   Enteroaggregative E coli (EAEC) NOT DETECTED NOT DETECTED Final   Enteropathogenic E coli (EPEC) NOT DETECTED NOT DETECTED Final   Enterotoxigenic E coli (ETEC) NOT DETECTED NOT DETECTED Final   Shiga like toxin producing E coli (STEC) NOT DETECTED NOT DETECTED Final   Shigella/Enteroinvasive E coli (EIEC) NOT DETECTED NOT DETECTED Final   Cryptosporidium NOT DETECTED NOT DETECTED Final   Cyclospora cayetanensis NOT DETECTED NOT DETECTED Final   Entamoeba histolytica NOT DETECTED NOT DETECTED Final   Giardia lamblia NOT DETECTED NOT DETECTED Final   Adenovirus F40/41  NOT DETECTED NOT DETECTED Final   Astrovirus NOT DETECTED NOT DETECTED Final   Norovirus GI/GII NOT DETECTED NOT DETECTED Final   Rotavirus A NOT DETECTED NOT DETECTED Final   Sapovirus (I, II, IV, and V) NOT DETECTED NOT DETECTED Final         Radiology Studies: No results found.      Scheduled Meds: . atomoxetine  120 mg Oral Daily  . calcium carbonate  1 tablet Oral TID  . cholecalciferol  1,000 Units Oral Daily  . divalproex  500 mg Oral BID  . enoxaparin (LOVENOX) injection  40 mg Subcutaneous Q24H  . loratadine  10 mg Oral QHS  . mometasone-formoterol  2 puff Inhalation BID  . montelukast  10 mg Oral QHS  . saccharomyces boulardii  250 mg Oral Q1400  . sertraline  100 mg Oral Daily  . traZODone  100 mg Oral QHS  . [START ON 04/15/2016] Vitamin D (Ergocalciferol)  50,000 Units Oral Q Tue   Continuous Infusions: . sodium chloride 100 mL/hr at 04/09/16 1544  . sodium chloride 10 mL/hr at 04/10/16 0352     LOS: 1 day    Time spent: 25 minutes. Greater than 50% of this time was spent in direct contact with the patient coordinating care.     Lelon Frohlich, MD [facility] Pager [phone]  If 7PM-7AM, please contact night-coverage [email or web address] Password [facility] 04/10/2016, 2:34 PM

## 2016-04-10 NOTE — Progress Notes (Signed)
Initial Nutrition Assessment  DOCUMENTATION CODES:  Obesity class I    INTERVENTION:  Soft diet as tolerated  Offer snacks between meals  Recommend daily weights to establish patient current baseline weight. Follow trend to determine significance of change.  NUTRITION DIAGNOSIS:   Increased protein and energy needs related to acute illness (diarrhea) as evidenced by est needs and pt hx  GOAL:  Pt to meet >/= 90% of their estimated nutrition needs      MONITOR:  Po intake, labs and wt trends     REASON FOR ASSESSMENT:  Malnutrition Screen      ASSESSMENT: Patient assessed on 10/16 by RD. He was discharged on 10/23 and scheduled to follow up with GI on 11/1.  He presented at his appointment c/o acute onset of watery stool and vomiting. He has been admitted for repletion of fluids and antibiotics. His weight is down according to his mom. Usual body weight range is 107-109 kg. Patient is feeling like eating and the GI team has fully advanced his diet to soft now.  Nutrition-Focused physical exam findings are  no fat depletion, no muscle depletion, and no edema.  Recent Labs Lab 04/05/16 1714 04/09/16 1557 04/10/16 0632  NA 135 136 137  K 3.9 4.0 3.9  CL 106 104 105  CO2 22 26 26   BUN 7 5* 5*  CREATININE 0.96 0.79 0.89  CALCIUM 8.9 9.0 8.5*  GLUCOSE 139* 95 103*   Labs: reviewed  Meds/vitmins: vitamin D, Florastor, TUMS,depakote, flagyl, zoloft, vancomycin  Diet Order:  Diet NPO time specified Except for: Sips with Meds, Ice Chips  Skin:   no breakdown noted  Last BM:   11/2   Height:   Ht Readings from Last 1 Encounters:  04/09/16 5' 5"  (1.651 m)  Pt was 68" (1.73 m) on last admission. His mother says this is his accurate height.  Weight:   Wt Readings from Last 1 Encounters:  04/09/16 218 lb (98.9 kg)  wt on 03/25/15- 249#   Usual body weight 107-109 kg-  Ideal Body Weight:    62 kg  BMI:  Body mass index is 36.28 kg/m.  Estimated Nutritional  Needs:   Kcal:   1191-4782  Protein:   110-120 gr  Fluid:   >2.0 liters daily  EDUCATION NEEDS: No issues noted at this time   Colman Cater MS,RD,CSG,LDN Office: #[phone] Pager: [phone]

## 2016-04-11 NOTE — Care Management Note (Signed)
Case Management Note  Patient Details  Name: Wayne Avila MRN: [id number] Date of Birth: 12/20/1990  Subjective/Objective:                  Pt admitted with C-diff. Chart reviewed for CM needs. Pt is from home, lives with family and is ind with ADL's. He has PCP, transportation and insurance with drug coverage. Pt plans to return home with self care.   Action/Plan: No CM needs.   Expected Discharge Date:     04/14/2016             Expected Discharge Plan:  Home/Self Care  In-House Referral:  NA  Discharge planning Services  CM Consult  Post Acute Care Choice:  NA Choice offered to:  NA  Status of Service:  Completed, signed off  Sherald Barge, RN 04/11/2016, 11:49 AM

## 2016-04-11 NOTE — Progress Notes (Signed)
Patient discharged home.  IV removed - WNL.  Reviewed meds and DC instructions - verbalizes understanding. Patient already has appointment with GI at [facility], instructed to keep appointment for follow up.  No questions at this time, assisted off unit in NAD.

## 2016-04-11 NOTE — Discharge Summary (Signed)
Physician Discharge Summary  Wayne Avila OXB:[id number] DOB: 1990/10/20 DOA: 04/09/2016  PCP: [name], MD  Admit date: 04/09/2016 Discharge date: 04/11/2016  Time spent: 45 minutes  Recommendations for Outpatient Follow-up:  -Will be discharged home today. -Advised to attend follow up scheduled with [facility].   Discharge Diagnoses:  Principal Problem:   C. difficile colitis Active Problems:   Bipolar 1 disorder, manic, moderate (HCC)   Ulcerative colitis ([place])   Dehydration   Generalized abdominal pain   Discharge Condition: Stable and improved  Filed Weights   04/09/16 1248  Weight: 98.9 kg (218 lb)    History of present illness:  Wayne Avila is a 25 y.o. male with a h/o ulcerative colitis, asthma and bipolar recently hospitalized with c diff colitis. He had finished his 2 weeks of vancomycin and for the past 36 hours has noticed increased abdominal pain, recurrence of watery diarrhea (>10 stools today) and vomiting. Went to see Dr. [name] today and was sent to the hospital for IVF and a repeat course of vancomycin. No labs available at time of admission.  Hospital Course:   Diarrhea/Abd pain/emesis -C Diff PCR negative; will DC vanc/flagyl. -Stool pathogen panel negative. -Unclear what the cause of diarrhea could be; maybe related to his UC?. -Has f/u appointment with [facility].  Bipolar Disorder -Continue psychotropic meds. -Mood stable, flat affect.   Procedures:  None   Consultations:  GI  Discharge Instructions  Discharge Instructions    Increase activity slowly    Complete by:  As directed        Medication List    STOP taking these medications   HYDROcodone-acetaminophen 5-325 MG tablet Commonly known as:  NORCO/VICODIN     TAKE these medications   albuterol (2.5 MG/3ML) 0.083% nebulizer solution Commonly known as:  PROVENTIL Inhale 2.5 mg into the lungs every 6 (six) hours as needed for wheezing or shortness of  breath.   albuterol 108 (90 Base) MCG/ACT inhaler Commonly known as:  PROVENTIL HFA;VENTOLIN HFA Inhale 2 puffs into the lungs every 4 (four) hours as needed for wheezing or shortness of breath (and/or cough).   atomoxetine 25 MG capsule Commonly known as:  STRATTERA Take 25 mg by mouth daily. Pt takes with a 191m capsule.   atomoxetine 100 MG capsule Commonly known as:  STRATTERA Take 100 mg by mouth daily. Pt takes with a 234mcapsule.   budesonide-formoterol 160-4.5 MCG/ACT inhaler Commonly known as:  SYMBICORT Inhale 2 puffs into the lungs 2 (two) times daily.   calcium carbonate 500 MG chewable tablet Commonly known as:  TUMS Chew 1 tablet (200 mg of elemental calcium total) by mouth 3 (three) times daily.   cholecalciferol 1000 units tablet Commonly known as:  VITAMIN D Take 1 tablet (1,000 Units total) by mouth daily. START AFTER VITAMIN D 50,000 U TABLETS ARE COMPLETE   divalproex 500 MG DR tablet Commonly known as:  DEPAKOTE Take 1 tablet (500 mg total) by mouth 2 (two) times daily.   EPIPEN 2-PAK 0.3 mg/0.3 mL Soaj injection Generic drug:  EPINEPHrine Inject 0.3 mg into the muscle once as needed (for severe allergic reaction).   fluticasone 50 MCG/ACT nasal spray Commonly known as:  FLONASE Place 1-2 sprays into both nostrils daily as needed for rhinitis.   loratadine 10 MG tablet Commonly known as:  CLARITIN Take 10 mg by mouth at bedtime.   montelukast 10 MG tablet Commonly known as:  SINGULAIR Take 1 tablet (  10 mg total) by mouth at bedtime.   ondansetron 8 MG disintegrating tablet Commonly known as:  ZOFRAN ODT Take 1 tablet (8 mg total) by mouth every 8 (eight) hours as needed for nausea or vomiting.   promethazine 12.5 MG tablet Commonly known as:  PHENERGAN 1 PO BID. MAY USE ADDITIONAL DOSE EVERY 4 HOURS. NO MORE THAN 8 DOSES A DAY.   saccharomyces boulardii 250 MG capsule Commonly known as:  FLORASTOR Take 1 capsule (250 mg total) by mouth  daily at 2 PM.   sertraline 100 MG tablet Commonly known as:  ZOLOFT Take 1 tablet (100 mg total) by mouth daily.   traMADol 50 MG tablet Commonly known as:  ULTRAM Take 100 mg by mouth every 6 (six) hours as needed for moderate pain (headache pain).   traZODone 100 MG tablet Commonly known as:  DESYREL Take 100 mg by mouth at bedtime.   vancomycin 50 mg/mL oral solution Commonly known as:  VANCOCIN Take 231m po qid for 1 week then daily for 7 days, every other day for 7 days, then every 3 days for 14 days   Vitamin D (Ergocalciferol) 50000 units Caps capsule Commonly known as:  DRISDOL Take 1 capsule (50,000 Units total) by mouth every 7 (seven) days.      Allergies  Allergen Reactions  . Amoxicillin-Pot Clavulanate Nausea And Vomiting and Other (See Comments)    Has patient had a PCN reaction causing immediate rash, facial/tongue/throat swelling, SOB or lightheadedness with hypotension: No Has patient had a PCN reaction causing severe rash involving mucus membranes or skin necrosis: No Has patient had a PCN reaction that required hospitalization No Has patient had a PCN reaction occurring within the last 10 years: No If all of the above answers are "NO", then may proceed with Cephalosporin use.  . Ibuprofen Other (See Comments)    Per pts MD he is unable to take this medication.   . Omeprazole Nausea And Vomiting  . Pineapple Swelling and Other (See Comments)    Reaction:  Lip swelling  . Strawberry Extract Swelling and Other (See Comments)    Reaction:  Lip swelling  . Tomato Rash      The results of significant diagnostics from this hospitalization (including imaging, microbiology, ancillary and laboratory) are listed below for reference.    Significant Diagnostic Studies: Ct Abdomen Pelvis W Contrast  Result Date: 03/18/2016 CLINICAL DATA:  Abdominal pain. Recent diagnosis of C diff. Patient reports diarrhea. EXAM: CT ABDOMEN AND PELVIS WITH CONTRAST TECHNIQUE:  Multidetector CT imaging of the abdomen and pelvis was performed using the standard protocol following bolus administration of intravenous contrast. CONTRAST:  1052mISOVUE-300 IOPAMIDOL (ISOVUE-300) INJECTION 61% COMPARISON:  CT 01/27/2016, 11/21/2015, additional priors reviewed FINDINGS: Lower chest: Lung bases are clear. Hepatobiliary: No focal liver abnormality is seen. No gallstones, gallbladder wall thickening, or biliary dilatation. Pancreas: Unremarkable. No pancreatic ductal dilatation or surrounding inflammatory changes. Spleen: Normal in size without focal abnormality. Adrenals/Urinary Tract: Adrenal glands are unremarkable. Kidneys are normal, without renal calculi, focal lesion, or hydronephrosis. Bladder is unremarkable. Stomach/Bowel: Scattered colonic wall thickening involving the sigmoid colon, splenic flexure, and descending colon. No small bowel dilatation. Stomach is physiologically distended. Appendix is normal. Vascular/Lymphatic: No significant vascular findings are present. No enlarged abdominal or pelvic lymph nodes. Reproductive: Prostate is unremarkable. Other: No abdominal wall hernia or abnormality. No abdominopelvic ascites. Musculoskeletal: There are no acute or suspicious osseous abnormalities. IMPRESSION: Scattered colonic wall thickening most prominent in the sigmoid  colon, consistent with colitis. Electronically Signed   By: Jeb Levering M.D.   On: 03/18/2016 06:09    Microbiology: Recent Results (from the past 240 hour(s))  C difficile quick scan w PCR reflex     Status: None   Collection Time: 04/09/16  3:19 PM  Result Value Ref Range Status   C Diff antigen NEGATIVE NEGATIVE Final   C Diff toxin NEGATIVE NEGATIVE Final   C Diff interpretation No C. difficile detected.  Final  Gastrointestinal Panel by PCR , Stool     Status: None   Collection Time: 04/09/16  3:20 PM  Result Value Ref Range Status   Campylobacter species NOT DETECTED NOT DETECTED Final    Plesimonas shigelloides NOT DETECTED NOT DETECTED Final   Salmonella species NOT DETECTED NOT DETECTED Final   Yersinia enterocolitica NOT DETECTED NOT DETECTED Final   Vibrio species NOT DETECTED NOT DETECTED Final   Vibrio cholerae NOT DETECTED NOT DETECTED Final   Enteroaggregative E coli (EAEC) NOT DETECTED NOT DETECTED Final   Enteropathogenic E coli (EPEC) NOT DETECTED NOT DETECTED Final   Enterotoxigenic E coli (ETEC) NOT DETECTED NOT DETECTED Final   Shiga like toxin producing E coli (STEC) NOT DETECTED NOT DETECTED Final   Shigella/Enteroinvasive E coli (EIEC) NOT DETECTED NOT DETECTED Final   Cryptosporidium NOT DETECTED NOT DETECTED Final   Cyclospora cayetanensis NOT DETECTED NOT DETECTED Final   Entamoeba histolytica NOT DETECTED NOT DETECTED Final   Giardia lamblia NOT DETECTED NOT DETECTED Final   Adenovirus F40/41 NOT DETECTED NOT DETECTED Final   Astrovirus NOT DETECTED NOT DETECTED Final   Norovirus GI/GII NOT DETECTED NOT DETECTED Final   Rotavirus A NOT DETECTED NOT DETECTED Final   Sapovirus (I, II, IV, and V) NOT DETECTED NOT DETECTED Final     Labs: Basic Metabolic Panel:  Recent Labs Lab 04/05/16 1714 04/09/16 1557 04/10/16 0632  NA 135 136 137  K 3.9 4.0 3.9  CL 106 104 105  CO2 22 26 26   GLUCOSE 139* 95 103*  BUN 7 5* 5*  CREATININE 0.96 0.79 0.89  CALCIUM 8.9 9.0 8.5*   Liver Function Tests:  Recent Labs Lab 04/09/16 1557  AST 14*  ALT 10*  ALKPHOS 46  BILITOT 0.4  PROT 6.7  ALBUMIN 2.9*   No results for input(s): LIPASE, AMYLASE in the last 168 hours. No results for input(s): AMMONIA in the last 168 hours. CBC:  Recent Labs Lab 04/05/16 1714 04/09/16 1557 04/10/16 0632  WBC 8.1 7.8 9.0  NEUTROABS 4.8  --   --   HGB 12.4* 11.5* 10.3*  HCT 35.7* 34.3* 30.7*  MCV 84.0 84.5 85.0  PLT 339 335 317   Cardiac Enzymes: No results for input(s): CKTOTAL, CKMB, CKMBINDEX, TROPONINI in the last 168 hours. BNP: BNP (last 3  results) No results for input(s): BNP in the last 8760 hours.  ProBNP (last 3 results) No results for input(s): PROBNP in the last 8760 hours.  CBG: No results for input(s): GLUCAP in the last 168 hours.     SignedLelon Frohlich  [facility] Pager: [phone] 04/11/2016, 1:57 PM

## 2016-04-14 ENCOUNTER — Telehealth: Payer: Self-pay | Admitting: Gastroenterology

## 2016-04-14 ENCOUNTER — Encounter (HOSPITAL_COMMUNITY): Payer: Self-pay | Admitting: *Deleted

## 2016-04-14 ENCOUNTER — Emergency Department (HOSPITAL_COMMUNITY)
Admission: EM | Admit: 2016-04-14 | Discharge: 2016-04-14 | Disposition: A | Discharge status: Home / Self Care | Payer: Medicaid Other | Attending: Emergency Medicine | Admitting: Emergency Medicine

## 2016-04-14 DIAGNOSIS — K529 Noninfective gastroenteritis and colitis, unspecified: Secondary | ICD-10-CM | POA: Diagnosis not present

## 2016-04-14 DIAGNOSIS — Z87891 Personal history of nicotine dependence: Secondary | ICD-10-CM | POA: Diagnosis not present

## 2016-04-14 DIAGNOSIS — F909 Attention-deficit hyperactivity disorder, unspecified type: Secondary | ICD-10-CM | POA: Diagnosis not present

## 2016-04-14 DIAGNOSIS — Z79899 Other long term (current) drug therapy: Secondary | ICD-10-CM | POA: Insufficient documentation

## 2016-04-14 DIAGNOSIS — J45909 Unspecified asthma, uncomplicated: Secondary | ICD-10-CM | POA: Insufficient documentation

## 2016-04-14 DIAGNOSIS — R197 Diarrhea, unspecified: Secondary | ICD-10-CM | POA: Diagnosis present

## 2016-04-14 LAB — COMPREHENSIVE METABOLIC PANEL
ALT: 10 U/L — ABNORMAL LOW (ref 17–63)
AST: 13 U/L — ABNORMAL LOW (ref 15–41)
Albumin: 2.8 g/dL — ABNORMAL LOW (ref 3.5–5.0)
Alkaline Phosphatase: 45 U/L (ref 38–126)
Anion gap: 8 (ref 5–15)
BUN: 5 mg/dL — ABNORMAL LOW (ref 6–20)
CO2: 25 mmol/L (ref 22–32)
Calcium: 8.8 mg/dL — ABNORMAL LOW (ref 8.9–10.3)
Chloride: 103 mmol/L (ref 101–111)
Creatinine, Ser: 0.81 mg/dL (ref 0.61–1.24)
GFR calc Af Amer: >60 mL/min (ref >60)
GFR calc non Af Amer: >60 mL/min (ref >60)
Glucose, Bld: 109 mg/dL — ABNORMAL HIGH (ref 65–99)
Potassium: 3.4 mmol/L — ABNORMAL LOW (ref 3.5–5.1)
Sodium: 136 mmol/L (ref 135–145)
Total Bilirubin: 0.3 mg/dL (ref 0.3–1.2)
Total Protein: 6.5 g/dL (ref 6.5–8.1)

## 2016-04-14 LAB — URINE MICROSCOPIC-ADD ON
RBC / HPF: NONE SEEN RBC/hpf (ref 0–5)
Squamous Epithelial / LPF: NONE SEEN

## 2016-04-14 LAB — CBC
HCT: 33.4 % — ABNORMAL LOW (ref 39.0–52.0)
Hemoglobin: 11.4 g/dL — ABNORMAL LOW (ref 13.0–17.0)
MCH: 28.9 pg (ref 26.0–34.0)
MCHC: 34.1 g/dL (ref 30.0–36.0)
MCV: 84.6 fL (ref 78.0–100.0)
Platelets: 316 10*3/uL (ref 150–400)
RBC: 3.95 MIL/uL — ABNORMAL LOW (ref 4.22–5.81)
RDW: 14.6 % (ref 11.5–15.5)
WBC: 10.6 10*3/uL — ABNORMAL HIGH (ref 4.0–10.5)

## 2016-04-14 LAB — URINALYSIS, ROUTINE W REFLEX MICROSCOPIC
Glucose, UA: NEGATIVE mg/dL
Hgb urine dipstick: NEGATIVE
Ketones, ur: 15 mg/dL — AB
Leukocytes, UA: NEGATIVE
Nitrite: NEGATIVE
Protein, ur: 30 mg/dL — AB
Specific Gravity, Urine: 1.025 (ref 1.005–1.030)
pH: 6 (ref 5.0–8.0)

## 2016-04-14 LAB — C DIFFICILE QUICK SCREEN W PCR REFLEX
C Diff antigen: NEGATIVE
C Diff interpretation: NOT DETECTED
C Diff toxin: NEGATIVE

## 2016-04-14 LAB — LIPASE, BLOOD: Lipase: 16 U/L (ref 11–51)

## 2016-04-14 MED ORDER — SODIUM CHLORIDE 0.9 % IV SOLN
1000.0000 mL | Freq: Once | INTRAVENOUS | Status: AC
  Administered 2016-04-14: 1000 mL via INTRAVENOUS

## 2016-04-14 MED ORDER — ONDANSETRON HCL 4 MG PO TABS: 4.0000 mg | ORAL_TABLET | Freq: Four times a day (QID) | ORAL | 0 refills | Status: DC

## 2016-04-14 MED ORDER — SODIUM CHLORIDE 0.9 % IV SOLN
1000.0000 mL | INTRAVENOUS | Status: DC
  Administered 2016-04-14: 1000 mL via INTRAVENOUS

## 2016-04-14 MED ORDER — HYDROCODONE-ACETAMINOPHEN 5-325 MG PO TABS: 1.0000 | ORAL_TABLET | ORAL | 0 refills | Status: DC | PRN

## 2016-04-14 NOTE — ED Triage Notes (Signed)
Pt comes in with his mother who states he has had diarrhea since Saturday. Pt denies any abdominal pain or vomiting. States he started having left leg pain that feels like cramping starting yesterday. Pt states he is eating and drinking appropriately. NAD noted in triage.

## 2016-04-14 NOTE — ED Provider Notes (Signed)
[facility] DEPT Provider Note   CSN: [id number] Arrival date & time: 04/14/16  7416  By signing my name below, I, Dora Sims, attest that this documentation has been prepared under the direction and in the presence of Lily Kocher, PA-C. Electronically Signed: Dora Sims, Scribe. 04/14/2016. 11:25 AM.  History   Chief Complaint Chief Complaint  Patient presents with  . Diarrhea    The history is provided by the patient and a parent. No language interpreter was used.  Diarrhea   This is a new problem. The current episode started more than 2 days ago. The problem occurs 5 to 10 times per day. The problem has not changed since onset.There has been no fever. Associated symptoms include myalgias (left thigh). Pertinent negatives include no vomiting and no chills. He has tried nothing for the symptoms.     HPI Comments: Wayne Avila is a 25 y.o. male with PMHx significant for colitis, GERD, and mental developmental delay who presents to the Emergency Department complaining of sudden onset, intermittent, diarrhea beginning 3 days ago. Pt reports he was recently admitted here for C. Diff colitis and was discharged 3 days ago. He states he had 5-6 episodes of diarrhea 3 days ago, a couple episodes 2 days ago, more than 5-6 episodes yesterday, and several episodes this morning. He states he has been eating and drinking normally. Pt's mother reports he was not treated with antibiotics during his hospital admission and was only treated for dehydration. No medications or treatments tried. He denies hematochezia, nausea, vomiting, fever, chills, or any other associated symptoms.  Pt also complains of constant, sharp, cramping, non-radiating left thigh pain beginning last night. He states he has been dealing with this problems intermittently for several months. He endorses left thigh pain exacerbation with movement. He denies recent trauma or known injury to his left thigh. No medications or  treatments tried. No h/o sickle cell. He denies left ankle pain, left calf pain, numbness, weakness, color change, wounds, or any other associated symptoms.  Past Medical History:  Diagnosis Date  . Abdominal pain, chronic, right upper quadrant   . ADHD (attention deficit hyperactivity disorder)   . Anxiety   . Asthma   . Bipolar 1 disorder ([place])   . Colitis   . Depression   . GERD (gastroesophageal reflux disease)   . Hearing loss   . HOH (hard of hearing)   . Mental developmental delay 11/15/2012  . PONV (postoperative nausea and vomiting)   . Seizures [facility])     Patient Active Problem List   Diagnosis Date Noted  . Generalized abdominal pain   . Dehydration 04/09/2016  . Abnormal thyroid function test 03/24/2016  . Acute blood loss anemia 03/23/2016  . Colitis 03/22/2016  . C. difficile colitis 03/22/2016  . Hypovolemia 03/22/2016  . Lower GI bleeding 03/22/2016  . Bipolar I disorder ([place]) 03/22/2016  . Diarrhea 03/07/2016  . Ulcerative colitis ([place]) 12/29/2015  . CAP (community acquired pneumonia)   . 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 Aspiration pneumonitis ([place]) 11/21/2015  . Chronic blood loss anemia 11/21/2015  . Bipolar I disorder, most recent episode depressed ([place])   . Bipolar 1 disorder, manic, moderate ([place]) 11/09/2014  . Suicidal ideation   . Major depressive disorder, recurrent episode, moderate ([place])   . Oral thrush 02/23/2014  . Mental developmental delay 11/15/2012    Past Surgical History:  Procedure Laterality Date  . ADENOIDECTOMY    . BIOPSY N/A 12/07/2012   Procedure: GASTRIC BIOPSIES;  Surgeon: [name], MD;  Location: [facility] ORS;  Service: Endoscopy;  Laterality: N/A;  . BIOPSY N/A 08/30/2013   Procedure: BIOPSY;  Surgeon: [name], MD;  Location: [facility] ORS;  Service: Endoscopy;  Laterality: N/A;  right colon,transverse colon, left colon, rectal biopsies  . BIOPSY  11/20/2015   Procedure: BIOPSY;  Surgeon: [name], MD;  Location: [facility] ENDO SUITE;   Service: Endoscopy;;  ileal, right colon biopsy, left colon, rectum  . COLONOSCOPY  11/20/2015   Dr. [name]: Severe erythema, edema, ulcers from the anal verge to 20 cm above the anal verge without mucosal sparing, remaining colon and terminal ileum appeared normal. Biopsies from the rectum revealed fulminant active chronic colitis consistent with IBD. Pathology from terminal ileum revealed intramucosal lymphoid aggregates. Remaining colon random biopsies with inactive chronic colitis  . COLONOSCOPY WITH PROPOFOL N/A 08/30/2013   BDZ:HGDJME mucosa in the terminal ileum/COLITIS/ MILD PROCTITIS. 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  Marland Kitchen COLONOSCOPY WITH PROPOFOL N/A 11/20/2015   Dr. 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 ESOPHAGOGASTRODUODENOSCOPY  11/20/2015   Dr. [name]: Normal exam, stomach biopsied and duodenal biopsy.. Benign biopsies.  . ESOPHAGOGASTRODUODENOSCOPY (EGD) WITH PROPOFOL N/A 12/07/2012   SLF:The mucosa of the esophagus appeared normal Non-erosive gastritis (inflammation) was found in the gastric antrum; multiple biopsies The duodenal mucosa showed no abnormalities in the bulb and second portion of the duodenum  . ESOPHAGOGASTRODUODENOSCOPY (EGD) WITH PROPOFOL N/A 11/20/2015   Dr. [name]: normal with normal biopsies, negative H.pylori   . TONSILLECTOMY         Home Medications    Prior to Admission medications   Medication Sig Start Date End Date Taking? Authorizing Provider  albuterol (PROVENTIL HFA;VENTOLIN HFA) 108 (90 Base) MCG/ACT inhaler Inhale 2 puffs into the lungs every 4 (four) hours as needed for wheezing or shortness of breath (and/or cough).   Yes Historical Provider, MD  albuterol (PROVENTIL) (2.5 MG/3ML) 0.083% nebulizer solution Inhale 2.5 mg into the lungs every 6 (six) hours as needed for wheezing or shortness  of breath.    Yes Historical Provider, MD  atomoxetine (STRATTERA) 100 MG capsule Take 100 mg by mouth daily. Pt takes with a 63m capsule.   Yes Historical Provider, MD  atomoxetine (STRATTERA) 25 MG capsule Take 25 mg by mouth daily. Pt takes with a 1032mcapsule.   Yes Historical Provider, MD  budesonide-formoterol (SYMBICORT) 160-4.5 MCG/ACT inhaler Inhale 2 puffs into the lungs 2 (two) times daily.   Yes Historical Provider, MD  calcium carbonate (TUMS) 500 MG chewable tablet Chew 1 tablet (200 mg of elemental calcium total) by mouth 3 (three) times daily. 04/08/16  Yes SaDanie BinderMD  cholecalciferol (VITAMIN D) 1000 units tablet Take 1 tablet (1,000 Units total) by mouth daily. START AFTER VITAMIN D 50,000 U TABLETS ARE COMPLETE 04/08/16  Yes SaDanie BinderMD  divalproex (DEPAKOTE) 500 MG DR tablet Take 1 tablet (500 mg total) by mouth 2 (two) times daily. 07/03/15  Yes ViPenni BombardMD  EPINEPHrine (EPIPEN 2-PAK) 0.3 mg/0.3 mL IJ SOAJ injection Inject 0.3 mg into the muscle once as needed (for severe allergic reaction).   Yes Historical Provider, MD  fluticasone (FLONASE) 50 MCG/ACT nasal spray Place 1-2 sprays into both nostrils daily as needed for rhinitis.   Yes Historical Provider, MD  loratadine (CLARITIN) 10 MG tablet Take 10  mg by mouth at bedtime.    Yes Historical Provider, MD  montelukast (SINGULAIR) 10 MG tablet Take 1 tablet (10 mg total) by mouth at bedtime. 11/13/14  Yes [name], NP  saccharomyces boulardii (FLORASTOR) 250 MG capsule Take 1 capsule (250 mg total) by mouth daily at 2 PM. 03/31/16  Yes Kathie Dike, MD  sertraline (ZOLOFT) 100 MG tablet Take 1 tablet (100 mg total) by mouth daily. 11/13/14  Yes [name], NP  traMADol (ULTRAM) 50 MG tablet Take 100 mg by mouth every 6 (six) hours as needed for moderate pain (headache pain).    Yes Historical Provider, MD  traZODone (DESYREL) 100 MG tablet Take 100 mg by mouth at bedtime.   Yes Historical Provider,  MD  Vitamin D, Ergocalciferol, (DRISDOL) 50000 units CAPS capsule Take 1 capsule (50,000 Units total) by mouth every 7 (seven) days. 04/08/16  Yes [name], MD  ondansetron (ZOFRAN ODT) 8 MG disintegrating tablet Take 1 tablet (8 mg total) by mouth every 8 (eight) hours as needed for nausea or vomiting. Patient not taking: Reported on 04/09/2016 12/25/15   Jola Schmidt, MD  promethazine (PHENERGAN) 12.5 MG tablet 1 PO BID. MAY USE ADDITIONAL DOSE EVERY 4 HOURS. NO MORE THAN 8 DOSES A DAY. Patient not taking: Reported on 04/09/2016 03/18/16   [name], MD  vancomycin (VANCOCIN) 50 mg/mL oral solution Take 269m po qid for 1 week then daily for 7 days, every other day for 7 days, then every 3 days for 14 days Patient not taking: Reported on 04/14/2016 03/31/16   JKathie Dike MD    Family History Family History  Problem Relation Age of Onset  . Asthma Mother   . Ulcers Mother   . Bipolar disorder Mother   . ADD / ADHD Father   . Diabetes Maternal Grandmother   . Diabetes Maternal Grandfather   . Colon cancer Neg Hx   . Liver disease Neg Hx     Social History Social History  Substance Use Topics  . Smoking status: Former Smoker    Packs/day: 0.00    Years: 2.00    Quit date: 10/06/2012  . Smokeless tobacco: Never Used     Comment: Never smoked cigarettes  . Alcohol use No     Comment: denies usage     Allergies   Amoxicillin-pot clavulanate; Ibuprofen; Omeprazole; Pineapple; Strawberry extract; and Tomato   Review of Systems Review of Systems  Constitutional: Negative for chills.  Gastrointestinal: Positive for diarrhea. Negative for blood in stool, nausea and vomiting.  Musculoskeletal: Positive for myalgias (left thigh).  Skin: Negative for color change and wound.  Neurological: Negative for weakness and numbness.  All other systems reviewed and are negative.   Physical Exam Updated Vital Signs BP 127/85 (BP Location: Right Arm)   Pulse (!) 123   Temp  98.6 F (37 C) (Oral)   Resp 18   Ht 5' 5"  (1.651 m)   Wt 218 lb (98.9 kg)   SpO2 99%   BMI 36.28 kg/m   Physical Exam  Constitutional: He is oriented to person, place, and time. He appears well-developed and well-nourished. He appears lethargic. No distress.  Lethargic  HENT:  Head: Normocephalic and atraumatic.  Mouth/Throat: Mucous membranes are dry.  Tongue shows dry mucosa.  Eyes: Conjunctivae and EOM are normal.  Neck: Neck supple. No tracheal deviation present.  Cardiovascular: Tachycardia present.   Tachycardic at 104  Pulmonary/Chest: Effort normal. No respiratory distress.  Musculoskeletal: Normal range of motion.  Cap refill <2 seconds.  Neurological: He is oriented to person, place, and time. He appears lethargic.  Skin: Skin is warm and dry.  Psychiatric: He has a normal mood and affect. His behavior is normal.  Nursing note and vitals reviewed.    ED Treatments / Results  Labs (all labs ordered are listed, but only abnormal results are displayed) Labs Reviewed  COMPREHENSIVE METABOLIC PANEL - Abnormal; Notable for the following:       Result Value   Potassium 3.4 (*)    Glucose, Bld 109 (*)    BUN 5 (*)    Calcium 8.8 (*)    Albumin 2.8 (*)    AST 13 (*)    ALT 10 (*)    All other components within normal limits  CBC - Abnormal; Notable for the following:    WBC 10.6 (*)    RBC 3.95 (*)    Hemoglobin 11.4 (*)    HCT 33.4 (*)    All other components within normal limits  URINALYSIS, ROUTINE W REFLEX MICROSCOPIC (NOT AT [facility]) - Abnormal; Notable for the following:    Bilirubin Urine SMALL (*)    Ketones, ur 15 (*)    Protein, ur 30 (*)    All other components within normal limits  URINE MICROSCOPIC-ADD ON - Abnormal; Notable for the following:    Bacteria, UA FEW (*)    All other components within normal limits  LIPASE, BLOOD    EKG  EKG Interpretation None       Radiology No results found.  Procedures Procedures (including critical  care time)  DIAGNOSTIC STUDIES: Oxygen Saturation is 99% on RA, normal by my interpretation.    COORDINATION OF CARE: 11:35 AM Discussed treatment plan with pt and his mother at bedside and they agreed to plan.  Medications Ordered in ED Medications - No data to display   Initial Impression / Assessment and Plan / ED Course  I have reviewed the triage vital signs and the nursing notes.  Pertinent labs & imaging results that were available during my care of the patient were reviewed by me and considered in my medical decision making (see chart for details).  Clinical Course     **I have reviewed nursing notes, vital signs, and all appropriate lab and imaging results for this patient.* **I personally performed the services described in this documentation, which was scribed in my presence. The recorded information has been reviewed and is accurate.*  Final Clinical Impressions(s) / ED Diagnoses  Patient feeling better after IV fluids.  Potassium was slightly low at 3.4, glucose is 109, the albumin is low at 2.8. The complete blood count shows a white blood cells to be slightly elevated at 10.6, the hemoglobin is 10.4 and hematocrit of 33.4. There is no shift to the left. The urinalysis shows ketones and proteins present in the urine, otherwise negative.  Case discussed with Dr. [name]. She suggested the patient continue the oral vancomycin. She will arrange for him to return on his Remicade this week. The patient is strongly encouraged to increase fluids, but to return to the emergency department if any changes or problems before he is seen by Dr. [name].   Pt scheduled for Remicade on Thursday. Pt will use Norco for pain if needed.    Final diagnoses:  Colitis  Diarrhea, unspecified type    New Prescriptions New Prescriptions   No medications on file     Lily Kocher,  PA-C 04/14/16 2033    Milton Ferguson, MD 04/15/16 1444

## 2016-04-14 NOTE — Discharge Instructions (Addendum)
Dr. [name] was consulted concerning your care. Your scheduled to restart her Remicade on Thursday. Please try to increase your fluids is much as possible. Use Norco for pain if needed. Use Tylenol for less severe pain. Return to the emergency department sooner if any changes, or problems before your Remicade is restarted. Use Zofran for nausea. Use Norco for pain if needed.This medication may cause drowsiness. Please do not drink, drive, or participate in activity that requires concentration while taking this medication.

## 2016-04-14 NOTE — ED Notes (Signed)
IV removed from Left Ac with catheter intact. Dressing applied.

## 2016-04-14 NOTE — ED Provider Notes (Signed)
[facility] DEPT Provider Note   CSN: [id number] Arrival date & time: 04/05/16  1601     History   Chief Complaint Chief Complaint  Patient presents with  . Urinary Frequency    HPI Wayne Avila is a 25 y.o. male.  HPI   25 year old male who reports no urinary output for several days. Recent treatment for C. difficile. Denies any acute abdominal pain. No urinary complaints aside from decreased output. Fevers or chills. No dizziness or lightheadedness. Continues to have loose stool. No blood. His concern that he may be dehydrated.  Past Medical History:  Diagnosis Date  . Abdominal pain, chronic, right upper quadrant   . ADHD (attention deficit hyperactivity disorder)   . Anxiety   . Asthma   . Bipolar 1 disorder ([place])   . Colitis   . Depression   . GERD (gastroesophageal reflux disease)   . Hearing loss   . HOH (hard of hearing)   . Mental developmental delay 11/15/2012  . PONV (postoperative nausea and vomiting)   . Seizures [facility])     Patient Active Problem List   Diagnosis Date Noted  . Generalized abdominal pain   . Dehydration 04/09/2016  . Abnormal thyroid function test 03/24/2016  . Acute blood loss anemia 03/23/2016  . Colitis 03/22/2016  . C. difficile colitis 03/22/2016  . Hypovolemia 03/22/2016  . Lower GI bleeding 03/22/2016  . Bipolar I disorder ([place]) 03/22/2016  . Diarrhea 03/07/2016  . Ulcerative colitis ([place]) 12/29/2015  . CAP (community acquired pneumonia)   . 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 Aspiration pneumonitis ([place]) 11/21/2015  . Chronic blood loss anemia 11/21/2015  . Bipolar I disorder, most recent episode depressed ([place])   . Bipolar 1 disorder, manic, moderate ([place]) 11/09/2014  . Suicidal ideation   . Major depressive disorder, recurrent episode, moderate ([place])   . Oral thrush 02/23/2014  . Mental developmental delay 11/15/2012    Past Surgical History:  Procedure Laterality Date  . ADENOIDECTOMY    . BIOPSY N/A 12/07/2012   Procedure: GASTRIC BIOPSIES;  Surgeon: [name], MD;  Location: [facility] ORS;  Service: Endoscopy;  Laterality: N/A;  . BIOPSY N/A 08/30/2013   Procedure: BIOPSY;  Surgeon: [name], MD;  Location: [facility] ORS;  Service: Endoscopy;  Laterality: N/A;  right colon,transverse colon, left colon, rectal biopsies  . BIOPSY  11/20/2015   Procedure: BIOPSY;  Surgeon: [name], MD;  Location: [facility] ENDO SUITE;  Service: Endoscopy;;  ileal, right colon biopsy, left colon, rectum  . COLONOSCOPY  11/20/2015   Dr. [name]: Severe erythema, edema, ulcers from the anal verge to 20 cm above the anal verge without mucosal sparing, remaining colon and terminal ileum appeared normal. Biopsies from the rectum revealed fulminant active chronic colitis consistent with IBD. Pathology from terminal ileum revealed intramucosal lymphoid aggregates. Remaining colon random biopsies with inactive chronic colitis  . COLONOSCOPY WITH PROPOFOL N/A 08/30/2013   HWE:[id number] mucosa in the terminal ileum/COLITIS/ MILD PROCTITIS. 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  Marland Kitchen COLONOSCOPY WITH PROPOFOL N/A 11/20/2015   Dr. 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 ESOPHAGOGASTRODUODENOSCOPY  11/20/2015   Dr. [name]: Normal exam, stomach biopsied and duodenal biopsy.. Benign biopsies.  . ESOPHAGOGASTRODUODENOSCOPY (EGD) WITH PROPOFOL N/A 12/07/2012   SLF:The mucosa of the esophagus appeared normal Non-erosive gastritis (inflammation) was found in the gastric antrum; multiple biopsies The duodenal mucosa  showed no abnormalities in the bulb and second portion of the duodenum  . ESOPHAGOGASTRODUODENOSCOPY (EGD) WITH PROPOFOL N/A 11/20/2015   Dr. [name]: normal with normal biopsies, negative H.pylori   . TONSILLECTOMY         Home Medications    Prior to Admission medications   Medication Sig  Start Date End Date Taking? Authorizing Provider  albuterol (PROVENTIL HFA;VENTOLIN HFA) 108 (90 Base) MCG/ACT inhaler Inhale 2 puffs into the lungs every 4 (four) hours as needed for wheezing or shortness of breath (and/or cough).    Historical Provider, MD  albuterol (PROVENTIL) (2.5 MG/3ML) 0.083% nebulizer solution Inhale 2.5 mg into the lungs every 6 (six) hours as needed for wheezing or shortness of breath.     Historical Provider, MD  atomoxetine (STRATTERA) 100 MG capsule Take 100 mg by mouth daily. Pt takes with a 23m capsule.    Historical Provider, MD  atomoxetine (STRATTERA) 25 MG capsule Take 25 mg by mouth daily. Pt takes with a 1021mcapsule.    Historical Provider, MD  budesonide-formoterol (SYMBICORT) 160-4.5 MCG/ACT inhaler Inhale 2 puffs into the lungs 2 (two) times daily.    Historical Provider, MD  calcium carbonate (TUMS) 500 MG chewable tablet Chew 1 tablet (200 mg of elemental calcium total) by mouth 3 (three) times daily. 04/08/16   SaDanie BinderMD  cholecalciferol (VITAMIN D) 1000 units tablet Take 1 tablet (1,000 Units total) by mouth daily. START AFTER VITAMIN D 50,000 U TABLETS ARE COMPLETE 04/08/16   SaDanie BinderMD  divalproex (DEPAKOTE) 500 MG DR tablet Take 1 tablet (500 mg total) by mouth 2 (two) times daily. 07/03/15   ViPenni BombardMD  EPINEPHrine (EPIPEN 2-PAK) 0.3 mg/0.3 mL IJ SOAJ injection Inject 0.3 mg into the muscle once as needed (for severe allergic reaction).    Historical Provider, MD  fluticasone (FLONASE) 50 MCG/ACT nasal spray Place 1-2 sprays into both nostrils daily as needed for rhinitis.    Historical Provider, MD  loratadine (CLARITIN) 10 MG tablet Take 10 mg by mouth at bedtime.     Historical Provider, MD  montelukast (SINGULAIR) 10 MG tablet Take 1 tablet (10 mg total) by mouth at bedtime. 11/13/14   LaNiel HummerNP  ondansetron (ZOFRAN ODT) 8 MG disintegrating tablet Take 1 tablet (8 mg total) by mouth every 8 (eight) hours as needed  for nausea or vomiting. Patient not taking: Reported on 04/09/2016 12/25/15   KeJola SchmidtMD  promethazine (PHENERGAN) 12.5 MG tablet 1 PO BID. MAY USE ADDITIONAL DOSE EVERY 4 HOURS. NO MORE THAN 8 DOSES A DAY. Patient not taking: Reported on 04/09/2016 03/18/16   SaDanie BinderMD  saccharomyces boulardii (FLORASTOR) 250 MG capsule Take 1 capsule (250 mg total) by mouth daily at 2 PM. 03/31/16   JeKathie DikeMD  sertraline (ZOLOFT) 100 MG tablet Take 1 tablet (100 mg total) by mouth daily. 11/13/14   LaNiel HummerNP  traMADol (ULTRAM) 50 MG tablet Take 100 mg by mouth every 6 (six) hours as needed for moderate pain (headache pain).     Historical Provider, MD  traZODone (DESYREL) 100 MG tablet Take 100 mg by mouth at bedtime.    Historical Provider, MD  vancomycin (VANCOCIN) 50 mg/mL oral solution Take 25067mo qid for 1 week then daily for 7 days, every other day for 7 days, then every 3 days for 14 days Patient not taking: Reported on 04/14/2016 03/31/16   JehKathie DikeD  Vitamin D, Ergocalciferol, (DRISDOL) 50000 units CAPS capsule Take 1 capsule (50,000 Units total) by mouth every 7 (seven) days. 04/08/16   [name], MD    Family History Family History  Problem Relation Age of Onset  . Asthma Mother   . Ulcers Mother   . Bipolar disorder Mother   . ADD / ADHD Father   . Diabetes Maternal Grandmother   . Diabetes Maternal Grandfather   . Colon cancer Neg Hx   . Liver disease Neg Hx     Social History Social History  Substance Use Topics  . Smoking status: Former Smoker    Packs/day: 0.00    Years: 2.00    Quit date: 10/06/2012  . Smokeless tobacco: Never Used     Comment: Never smoked cigarettes  . Alcohol use No     Comment: denies usage     Allergies   Amoxicillin-pot clavulanate; Ibuprofen; Omeprazole; Pineapple; Strawberry extract; and Tomato   Review of Systems Review of Systems  All systems reviewed and negative, other than as noted in  HPI.  Physical Exam Updated Vital Signs BP 130/90 (BP Location: Left Arm)   Pulse (S) (!) 136   Temp 98.2 F (36.8 C) (Oral)   Resp 18   Ht 5' 8"  (1.727 m)   Wt 221 lb 8 oz (100.5 kg)   SpO2 100%   BMI 33.68 kg/m   Physical Exam  Constitutional: He appears well-developed and well-nourished. No distress.  HENT:  Head: Normocephalic and atraumatic.  Eyes: Conjunctivae are normal. Right eye exhibits no discharge. Left eye exhibits no discharge.  Neck: Neck supple.  Cardiovascular: Regular rhythm and normal heart sounds.  Exam reveals no gallop and no friction rub.   No murmur heard. Mild tachycardia  Pulmonary/Chest: Effort normal and breath sounds normal. No respiratory distress.  Abdominal: Soft. He exhibits no distension. There is no tenderness.  Musculoskeletal: He exhibits no edema or tenderness.  Neurological: He is alert.  Skin: Skin is warm and dry.  Psychiatric: He has a normal mood and affect. His behavior is normal. Thought content normal.  Nursing note and vitals reviewed.    ED Treatments / Results  Labs (all labs ordered are listed, but only abnormal results are displayed) Labs Reviewed  CBC WITH DIFFERENTIAL/PLATELET - Abnormal; Notable for the following:       Result Value   Hemoglobin 12.4 (*)    HCT 35.7 (*)    Eosinophils Absolute 0.8 (*)    All other components within normal limits  BASIC METABOLIC PANEL - Abnormal; Notable for the following:    Glucose, Bld 139 (*)    All other components within normal limits  URINALYSIS, ROUTINE W REFLEX MICROSCOPIC (NOT AT [facility]) - Abnormal; Notable for the following:    Specific Gravity, Urine >1.030 (*)    Bilirubin Urine SMALL (*)    Ketones, ur TRACE (*)    Protein, ur 30 (*)    All other components within normal limits  URINE MICROSCOPIC-ADD ON - Abnormal; Notable for the following:    Squamous Epithelial / LPF 0-5 (*)    Bacteria, UA RARE (*)    All other components within normal limits    EKG  EKG  Interpretation  Date/Time:  Saturday April 05 2016 17:36:22 EDT Ventricular Rate:  110 PR Interval:    QRS Duration: 74 QT Interval:  304 QTC Calculation: 412 R Axis:   48 Text Interpretation:  Sinus tachycardia Borderline repolarization abnormality No significant change  since last tracing Confirmed by Winfred Leeds  MD, SAM [phone]) on 04/06/2016 3:01:18 PM       Radiology No results found.  Procedures Procedures (including critical care time)  Medications Ordered in ED Medications  sodium chloride 0.9 % bolus 1,000 mL (0 mLs Intravenous Stopped 04/05/16 1817)     Initial Impression / Assessment and Plan / ED Course  I have reviewed the triage vital signs and the nursing notes.  Pertinent labs & imaging results that were available during my care of the patient were reviewed by me and considered in my medical decision making (see chart for details).  Clinical Course     25 year old male with decreased urinary output. He reports he has not urinated in several days. Clinically this is highly unlikely. His tachycardia has improved with IV fluids. His labs are fairly unremarkable. Bladder is nondistended on exam. He did void spontaneously in the emergency room.  Final Clinical Impressions(s) / ED Diagnoses   Final diagnoses:  Decreased urination    New Prescriptions Discharge Medication List as of 04/05/2016  6:01 PM       Virgel Manifold, MD 04/14/16 1145

## 2016-04-14 NOTE — Telephone Encounter (Signed)
PATIENT SCHEDULED AND MOTHER AWARE OF DATE AND TIME

## 2016-04-14 NOTE — Telephone Encounter (Addendum)
PT NEEDS REMICADE SCHEDULED FOR THIS WEEK. MISSED LAST WEEK DUE TO HOSPITAL STAY FOR PROLONGED DIARRHEA. C DIFF PCR NEGATIVE. CONTINUE VANCOMYCIN TAPER. REFER TO [facility] [id number] ULCERATIVE COLITIS TO DISCUSS MANAGEMENT OPTIONS.  IN ED C/O DIARRHEA. SPOKE WITH PA DOBSON. WILL CHECK C DIFF PCR TODAY. PLAN FOR Remicade Thur at 1145. SPOKE WITH CAROLYN TO SCHEDULE.   OPV E30  NOV 8 FOR UC, UNCONTROLLED.

## 2016-04-16 ENCOUNTER — Encounter: Payer: Self-pay | Admitting: Gastroenterology

## 2016-04-16 ENCOUNTER — Ambulatory Visit (INDEPENDENT_AMBULATORY_CARE_PROVIDER_SITE_OTHER): Payer: Medicaid Other | Admitting: Gastroenterology

## 2016-04-16 DIAGNOSIS — K51819 Other ulcerative colitis with unspecified complications: Secondary | ICD-10-CM

## 2016-04-16 DIAGNOSIS — A0472 Enterocolitis due to Clostridium difficile, not specified as recurrent: Secondary | ICD-10-CM

## 2016-04-16 MED ORDER — ONDANSETRON HCL 4 MG PO TABS: ORAL_TABLET | ORAL | 0 refills | Status: DC

## 2016-04-16 MED ORDER — HYDROCODONE-ACETAMINOPHEN 5-325 MG PO TABS: 1.0000 | ORAL_TABLET | ORAL | 0 refills | Status: DC | PRN

## 2016-04-16 MED ORDER — VANCOMYCIN 50 MG/ML ORAL SOLUTION: ORAL | 0 refills | Status: DC

## 2016-04-16 MED ORDER — DIPHENOXYLATE-ATROPINE 2.5-0.025 MG PO TABS: ORAL_TABLET | ORAL | 1 refills | Status: DC

## 2016-04-16 NOTE — Patient Instructions (Signed)
Take calcium after meals three times a day.  TAKE VANCOMYCIN 2.5 MLs EVERY 3 DAYS FOR 10 DOSES.  ZOFRAN IF NEEDED FOR NAUSEA/VOMITING.  LOMOTIL TWICE DAILY AND IMODIUM IF NEED TO SLOW DOWN DIARRHEA.  [place] IF NEEDED FOR ABDOMINAL PAIN.  FOLLOW UP IN 2 MOS.

## 2016-04-16 NOTE — Progress Notes (Signed)
Subjective:    Patient ID: Wayne Avila, male    DOB: Aug 04, 1990, 25 y.o.   MRN: [id number]  [name], MD  HPI Since last visit , GOT BETTER WITH C DIFF GONE. VANCOMYCIN, STOPPED IT WHILE HE WAS [place]. USING IMODIUM TIMES A DAY. GETS COLD. STOOL LOOKS LIKE #5 STOOL. HAVIGN > 10 A DAY AND UP AT NIGHT.  NAUSEA: ZOFRAN 2-3 TIMES A DAY. NEEDS MORE. NO JOINT AI, SORES IN MOUTH, RASHES ON HIS LEGS. NO TOBACCO PRODUCTS. NO ASPIRIN, BC/GOODY POWDERS, IBUPROFEN/MOTRIN, OR NAPROXEN/ALEVE. NO ETOH. MILK: NONE. CHEESE: NOT REALLY. ICE CREAM: NONE. ABDOMINAL PAIN HURTS A LITTLE NOW. LAST WEEK WAS WORSE. GETS HUNGRY. HAS AN APPETITE. WEIGHT:  WEIGHT DOWN FROM MAY 2017-234 LBS  PT DENIES FEVER, CHILLS, HEMATOCHEZIA, HEMATEMESIS,  vomiting, melena, CHEST PAIN, SHORTNESS OF BREATH, CHANGE IN BOWEL IN HABITS, constipation, problems swallowing, problems OR heartburn or indigestion. NO SORES IN MOUTH, RASH ON LEGS, JOINT PAIN, OR BACK PAIN.  Past Medical History:  Diagnosis Date  . Abdominal pain, chronic, right upper quadrant   . ADHD (attention deficit hyperactivity disorder)   . Anxiety   . Asthma   . Bipolar 1 disorder ([place])   . Colitis   . Depression   . GERD (gastroesophageal reflux disease)   . Hearing loss   . HOH (hard of hearing)   . Mental developmental delay 11/15/2012  . PONV (postoperative nausea and vomiting)   . Seizures ([place])     Past Surgical History:  Procedure Laterality Date  . ADENOIDECTOMY    . BIOPSY N/A 12/07/2012   Procedure: GASTRIC BIOPSIES;  Surgeon: [name], MD;  Location: [facility] ORS;  Service: Endoscopy;  Laterality: N/A;  . BIOPSY N/A 08/30/2013   Procedure: BIOPSY;  Surgeon: [name], MD;  Location: [facility] ORS;  Service: Endoscopy;  Laterality: N/A;  right colon,transverse colon, left colon, rectal biopsies  . BIOPSY  11/20/2015   Procedure: BIOPSY;  Surgeon: [name], MD;  Location: [facility] ENDO SUITE;  Service: Endoscopy;;  ileal, right colon  biopsy, left colon, rectum  . COLONOSCOPY  11/20/2015   Dr. [name]: Severe erythema, edema, ulcers from the anal verge to 20 cm above the anal verge without mucosal sparing, remaining colon and terminal ileum appeared normal. Biopsies from the rectum revealed fulminant active chronic colitis consistent with IBD. Pathology from terminal ileum revealed intramucosal lymphoid aggregates. Remaining colon random biopsies with inactive chronic colitis  . COLONOSCOPY WITH PROPOFOL N/A 08/30/2013   [id number] mucosa in the terminal ileum/COLITIS/ MILD PROCTITIS. 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  Marland Kitchen COLONOSCOPY WITH PROPOFOL N/A 11/20/2015   Dr. 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 ESOPHAGOGASTRODUODENOSCOPY  11/20/2015   Dr. [name]: Normal exam, stomach biopsied and duodenal biopsy.. Benign biopsies.  . ESOPHAGOGASTRODUODENOSCOPY (EGD) WITH PROPOFOL N/A 12/07/2012   SLF:The mucosa of the esophagus appeared normal Non-erosive gastritis (inflammation) was found in the gastric antrum; multiple biopsies The duodenal mucosa showed no abnormalities in the bulb and second portion of the duodenum  . ESOPHAGOGASTRODUODENOSCOPY (EGD) WITH PROPOFOL N/A 11/20/2015   Dr. [name]: normal with normal biopsies, negative H.pylori   . TONSILLECTOMY      Allergies  Allergen Reactions  . Amoxicillin-Pot Clavulanate Nausea And Vomiting and Other (See Comments)      . Ibuprofen Other (See Comments)    Per pts  MD he is unable to take this medication.   . Omeprazole Nausea And Vomiting  . Pineapple Swelling and Other (See Comments)    Reaction:  Lip swelling  . Strawberry Extract Swelling and Other (See Comments)    Reaction:  Lip swelling  . Tomato Rash    Current Outpatient Prescriptions  Medication Sig Dispense Refill  . albuterol (PROVENTIL HFA;VENTOLIN HFA)  108 (90 Base) MCG/ACT inhaler Inhale 2 puffs into the lungs every 4 (four) hours as needed for wheezing or shortness of breath (and/or cough).    Marland Kitchen albuterol (PROVENTIL) (2.5 MG/3ML) 0.083% nebulizer solution Inhale 2.5 mg into the lungs every 6 (six) hours as needed for wheezing or shortness of breath.     Marland Kitchen atomoxetine (STRATTERA) 100 MG capsule Take 100 mg by mouth daily. Pt takes with a 17m capsule.    .Marland Kitchenatomoxetine (STRATTERA) 25 MG capsule Take 25 mg by mouth daily. Pt takes with a 1018mcapsule.    . budesonide-formoterol (SYMBICORT) 160-4.5 MCG/ACT inhaler Inhale 2 puffs into the lungs 2 (two) times daily.    . calcium carbonate (TUMS) 500 MG chewable tablet Chew 1 tablet (200 mg of elemental calcium total) by mouth 3 (three) times daily.    . divalproex (DEPAKOTE) 500 MG DR tablet Take 1 tablet (500 mg total) by mouth 2 (two) times daily.    . Marland KitchenPINEPHrine (EPIPEN 2-PAK) 0.3 mg/0.3 mL IJ SOAJ injection Inject 0.3 mg into the muscle once as needed (for severe allergic reaction).    . fluticasone (FLONASE) 50 MCG/ACT nasal spray Place 1-2 sprays into both nostrils daily as needed for rhinitis.    . Marland KitchenYDROcodone-acetaminophen (NORCO/VICODIN) 5-325 MG tablet Take 1-2 tablets by mouth every 4 (four) hours as needed.    . loratadine (CLARITIN) 10 MG tablet Take 10 mg by mouth at bedtime.     . montelukast (SINGULAIR) 10 MG tablet Take 1 tablet (10 mg total) by mouth at bedtime.    . ondansetron (ZOFRAN ODT) 8 MG disintegrating tablet Take 1 tablet (8 mg total) by mouth every 8 (eight) hours as needed for nausea or vomiting.    . ondansetron (ZOFRAN) 4 MG tablet Take 1 tablet (4 mg total) by mouth every 6 (six) hours.    . saccharomyces boulardii (FLORASTOR) 250 MG capsule Take 1 capsule (250 mg total) by mouth daily at 2 PM.    . sertraline (ZOLOFT) 100 MG tablet Take 1 tablet (100 mg total) by mouth daily.    . traMADol (ULTRAM) 50 MG tablet Take 100 mg by mouth every 6 (six) hours as needed for  moderate pain (headache pain).  DOESN'T HELP   . traZODone (DESYREL) 100 MG tablet Take 100 mg by mouth at bedtime.    . Vitamin D, Ergocalciferol, (DRISDOL) 50000 units CAPS capsule Take 1 capsule (50,000 Units total) by mouth every 7 (seven) days.    . cholecalciferol (VITAMIN D) 1000 units tablet Take 1 tablet (1,000 Units total) by mouth daily. START AFTER VITAMIN D 50,000 U TABLETS ARE COMPLETE (Patient not taking: Reported on 04/16/2016)    .      . Marland Kitchen     Review of Systems PER HPI OTHERWISE ALL SYSTEMS ARE NEGATIVE.    Objective:   Physical Exam  Constitutional: He is oriented to person, place, and time. He appears well-developed and well-nourished. No distress.  HENT:  Head: Normocephalic and atraumatic.  Mouth/Throat: Oropharynx is clear and moist. No oropharyngeal exudate.  NO SORES IN MOUTH  Eyes: Pupils are equal, round, and reactive to light. No scleral icterus.  Neck: Normal range of motion. Neck supple.  Cardiovascular: Normal rate, regular rhythm and normal heart sounds.   Pulmonary/Chest: Effort normal and breath sounds normal. No respiratory distress.  Abdominal: Soft. Bowel sounds are normal. He exhibits no distension. There is tenderness. There is no rebound and no guarding.  MILD PERI-UMBILICAL TTP  Musculoskeletal: He exhibits no edema.  Lymphadenopathy:    He has no cervical adenopathy.  Neurological: He is alert and oriented to person, place, and time.  NO FOCAL DEFICITS  Skin:  NO PRETIBIAL LESIONS  Psychiatric: He has a normal mood and affect.  Vitals reviewed.     Assessment & Plan:

## 2016-04-16 NOTE — Progress Notes (Signed)
ON RECALL  °

## 2016-04-16 NOTE — Assessment & Plan Note (Signed)
COURSE COMPLICATED BY PROLONGED INFECTION WITH C DIFF COLITIS. NEEDS VANC PULSE REGIMEN.   REMICADE TOMORROW. Take calcium after meals three times a day. TAKE VANCOMYCIN 2.5 MLs EVERY 3 DAYS FOR 10 DOSES. ZOFRAN IF NEEDED FOR NAUSEA/VOMITING. LOMOTIL TWICE DAILY AND IMODIUM IF NEED TO SLOW DOWN DIARRHEA. [place] IF NEEDED FOR ABDOMINAL PAIN. OPV WITH [facility] NOV 20. FOLLOW UP IN 2 MOS.

## 2016-04-16 NOTE — Assessment & Plan Note (Signed)
C DIFF PCR NEG NOV 1. PT IMMUNOSUPPRERSSED   WILL COMPLETE PULSE VANCOMYCIN THERAPY. CALL WITH QUESTIONS OR CONCERNS.

## 2016-04-17 ENCOUNTER — Telehealth: Payer: Self-pay | Admitting: Gastroenterology

## 2016-04-17 ENCOUNTER — Encounter (HOSPITAL_COMMUNITY): Payer: Self-pay | Admitting: Emergency Medicine

## 2016-04-17 ENCOUNTER — Observation Stay (HOSPITAL_COMMUNITY)
Admission: EM | Admit: 2016-04-17 | Discharge: 2016-04-18 | Disposition: A | Discharge status: Home / Self Care | Payer: Medicaid Other | Attending: Internal Medicine | Admitting: Internal Medicine

## 2016-04-17 ENCOUNTER — Other Ambulatory Visit: Payer: Self-pay

## 2016-04-17 ENCOUNTER — Emergency Department (HOSPITAL_COMMUNITY): Payer: Medicaid Other

## 2016-04-17 ENCOUNTER — Encounter (HOSPITAL_COMMUNITY)
Admit: 2016-04-17 | Discharge: 2016-04-17 | Disposition: A | Discharge status: Home / Self Care | Payer: Medicaid Other | Attending: Gastroenterology | Admitting: Gastroenterology

## 2016-04-17 DIAGNOSIS — K5190 Ulcerative colitis, unspecified, without complications: Principal | ICD-10-CM | POA: Insufficient documentation

## 2016-04-17 DIAGNOSIS — F319 Bipolar disorder, unspecified: Secondary | ICD-10-CM | POA: Insufficient documentation

## 2016-04-17 DIAGNOSIS — E861 Hypovolemia: Secondary | ICD-10-CM | POA: Diagnosis not present

## 2016-04-17 DIAGNOSIS — F419 Anxiety disorder, unspecified: Secondary | ICD-10-CM | POA: Insufficient documentation

## 2016-04-17 DIAGNOSIS — Z880 Allergy status to penicillin: Secondary | ICD-10-CM | POA: Diagnosis not present

## 2016-04-17 DIAGNOSIS — R1084 Generalized abdominal pain: Secondary | ICD-10-CM | POA: Diagnosis not present

## 2016-04-17 DIAGNOSIS — R197 Diarrhea, unspecified: Secondary | ICD-10-CM | POA: Insufficient documentation

## 2016-04-17 DIAGNOSIS — K922 Gastrointestinal hemorrhage, unspecified: Secondary | ICD-10-CM | POA: Diagnosis not present

## 2016-04-17 DIAGNOSIS — R112 Nausea with vomiting, unspecified: Secondary | ICD-10-CM | POA: Diagnosis present

## 2016-04-17 DIAGNOSIS — R569 Unspecified convulsions: Secondary | ICD-10-CM | POA: Diagnosis not present

## 2016-04-17 DIAGNOSIS — K449 Diaphragmatic hernia without obstruction or gangrene: Secondary | ICD-10-CM | POA: Insufficient documentation

## 2016-04-17 DIAGNOSIS — F909 Attention-deficit hyperactivity disorder, unspecified type: Secondary | ICD-10-CM | POA: Diagnosis not present

## 2016-04-17 DIAGNOSIS — Z888 Allergy status to other drugs, medicaments and biological substances status: Secondary | ICD-10-CM | POA: Diagnosis not present

## 2016-04-17 DIAGNOSIS — E860 Dehydration: Secondary | ICD-10-CM | POA: Diagnosis present

## 2016-04-17 DIAGNOSIS — G8929 Other chronic pain: Secondary | ICD-10-CM | POA: Diagnosis not present

## 2016-04-17 DIAGNOSIS — F3130 Bipolar disorder, current episode depressed, mild or moderate severity, unspecified: Secondary | ICD-10-CM | POA: Diagnosis not present

## 2016-04-17 DIAGNOSIS — Z87891 Personal history of nicotine dependence: Secondary | ICD-10-CM | POA: Diagnosis not present

## 2016-04-17 DIAGNOSIS — K51919 Ulcerative colitis, unspecified with unspecified complications: Secondary | ICD-10-CM | POA: Diagnosis present

## 2016-04-17 DIAGNOSIS — F79 Unspecified intellectual disabilities: Secondary | ICD-10-CM | POA: Insufficient documentation

## 2016-04-17 DIAGNOSIS — J45909 Unspecified asthma, uncomplicated: Secondary | ICD-10-CM | POA: Insufficient documentation

## 2016-04-17 DIAGNOSIS — Z886 Allergy status to analgesic agent status: Secondary | ICD-10-CM | POA: Insufficient documentation

## 2016-04-17 DIAGNOSIS — K219 Gastro-esophageal reflux disease without esophagitis: Secondary | ICD-10-CM | POA: Diagnosis not present

## 2016-04-17 LAB — COMPREHENSIVE METABOLIC PANEL
ALT: 8 U/L — ABNORMAL LOW (ref 17–63)
AST: 16 U/L (ref 15–41)
Albumin: 2.9 g/dL — ABNORMAL LOW (ref 3.5–5.0)
Alkaline Phosphatase: 43 U/L (ref 38–126)
Anion gap: 6 (ref 5–15)
BUN: <5 mg/dL — ABNORMAL LOW (ref 6–20)
CO2: 25 mmol/L (ref 22–32)
Calcium: 8.8 mg/dL — ABNORMAL LOW (ref 8.9–10.3)
Chloride: 105 mmol/L (ref 101–111)
Creatinine, Ser: 0.84 mg/dL (ref 0.61–1.24)
GFR calc Af Amer: >60 mL/min (ref >60)
GFR calc non Af Amer: >60 mL/min (ref >60)
Glucose, Bld: 105 mg/dL — ABNORMAL HIGH (ref 65–99)
Potassium: 3.5 mmol/L (ref 3.5–5.1)
Sodium: 136 mmol/L (ref 135–145)
Total Bilirubin: 0.3 mg/dL (ref 0.3–1.2)
Total Protein: 6.7 g/dL (ref 6.5–8.1)

## 2016-04-17 LAB — URINALYSIS, ROUTINE W REFLEX MICROSCOPIC
Bilirubin Urine: NEGATIVE
Glucose, UA: NEGATIVE mg/dL
Hgb urine dipstick: NEGATIVE
Ketones, ur: NEGATIVE mg/dL
Leukocytes, UA: NEGATIVE
Nitrite: NEGATIVE
Protein, ur: NEGATIVE mg/dL
Specific Gravity, Urine: 1.025 (ref 1.005–1.030)
pH: 6 (ref 5.0–8.0)

## 2016-04-17 LAB — LIPASE, BLOOD: Lipase: 22 U/L (ref 11–51)

## 2016-04-17 LAB — CBC
HCT: 34.5 % — ABNORMAL LOW (ref 39.0–52.0)
Hemoglobin: 11.4 g/dL — ABNORMAL LOW (ref 13.0–17.0)
MCH: 28.2 pg (ref 26.0–34.0)
MCHC: 33 g/dL (ref 30.0–36.0)
MCV: 85.4 fL (ref 78.0–100.0)
Platelets: 341 10*3/uL (ref 150–400)
RBC: 4.04 MIL/uL — ABNORMAL LOW (ref 4.22–5.81)
RDW: 14.6 % (ref 11.5–15.5)
WBC: 8.2 10*3/uL (ref 4.0–10.5)

## 2016-04-17 MED ORDER — CALCIUM CARBONATE ANTACID 500 MG PO CHEW
1.0000 | CHEWABLE_TABLET | Freq: Three times a day (TID) | ORAL | Status: DC
  Administered 2016-04-17 – 2016-04-18 (×3): 200 mg via ORAL
  Filled 2016-04-17 (×4): qty 1

## 2016-04-17 MED ORDER — ATOMOXETINE HCL 40 MG PO CAPS: 100.0000 mg | ORAL_CAPSULE | Freq: Every day | ORAL | Status: DC

## 2016-04-17 MED ORDER — SODIUM CHLORIDE 0.9 % IV SOLN: INTRAVENOUS | Status: DC

## 2016-04-17 MED ORDER — ALBUTEROL SULFATE (2.5 MG/3ML) 0.083% IN NEBU: 2.5000 mg | INHALATION_SOLUTION | RESPIRATORY_TRACT | Status: DC | PRN

## 2016-04-17 MED ORDER — ATOMOXETINE HCL 40 MG PO CAPS
120.0000 mg | ORAL_CAPSULE | Freq: Every day | ORAL | Status: DC
  Administered 2016-04-18: 120 mg via ORAL
  Filled 2016-04-17 (×5): qty 3

## 2016-04-17 MED ORDER — VANCOMYCIN 50 MG/ML ORAL SOLUTION
125.0000 mg | Freq: Four times a day (QID) | ORAL | Status: DC
  Administered 2016-04-17 – 2016-04-18 (×4): 125 mg via ORAL
  Filled 2016-04-17 (×16): qty 2.5

## 2016-04-17 MED ORDER — ATOMOXETINE HCL 25 MG PO CAPS: 25.0000 mg | ORAL_CAPSULE | Freq: Every day | ORAL | Status: DC

## 2016-04-17 MED ORDER — SACCHAROMYCES BOULARDII 250 MG PO CAPS
250.0000 mg | ORAL_CAPSULE | Freq: Every day | ORAL | Status: DC
  Administered 2016-04-18: 250 mg via ORAL
  Filled 2016-04-17: qty 1

## 2016-04-17 MED ORDER — MONTELUKAST SODIUM 10 MG PO TABS
10.0000 mg | ORAL_TABLET | Freq: Every day | ORAL | Status: DC
  Administered 2016-04-17: 10 mg via ORAL
  Filled 2016-04-17: qty 1

## 2016-04-17 MED ORDER — VITAMIN D 1000 UNITS PO TABS
1000.0000 [IU] | ORAL_TABLET | Freq: Every day | ORAL | Status: DC
  Administered 2016-04-18: 1000 [IU] via ORAL
  Filled 2016-04-17: qty 1

## 2016-04-17 MED ORDER — MOMETASONE FURO-FORMOTEROL FUM 200-5 MCG/ACT IN AERO
2.0000 | INHALATION_SPRAY | Freq: Two times a day (BID) | RESPIRATORY_TRACT | Status: DC
  Administered 2016-04-18: 2 via RESPIRATORY_TRACT
  Filled 2016-04-17: qty 8.8

## 2016-04-17 MED ORDER — DIVALPROEX SODIUM 250 MG PO DR TAB
500.0000 mg | DELAYED_RELEASE_TABLET | Freq: Two times a day (BID) | ORAL | Status: DC
  Administered 2016-04-17 – 2016-04-18 (×2): 500 mg via ORAL
  Filled 2016-04-17 (×2): qty 2

## 2016-04-17 MED ORDER — SERTRALINE HCL 50 MG PO TABS
100.0000 mg | ORAL_TABLET | Freq: Every day | ORAL | Status: DC
  Administered 2016-04-18: 100 mg via ORAL
  Filled 2016-04-17: qty 2

## 2016-04-17 MED ORDER — SODIUM CHLORIDE 0.9 % IV SOLN
INTRAVENOUS | Status: DC
  Administered 2016-04-17 – 2016-04-18 (×2): via INTRAVENOUS

## 2016-04-17 MED ORDER — MORPHINE SULFATE (PF) 2 MG/ML IV SOLN
2.0000 mg | INTRAVENOUS | Status: DC | PRN
  Administered 2016-04-17 – 2016-04-18 (×4): 2 mg via INTRAVENOUS
  Filled 2016-04-17 (×4): qty 1

## 2016-04-17 MED ORDER — SODIUM CHLORIDE 0.9 % IV SOLN
5.0000 mg/kg | Freq: Once | INTRAVENOUS | Status: DC
  Filled 2016-04-17: qty 50

## 2016-04-17 MED ORDER — PROMETHAZINE HCL 25 MG/ML IJ SOLN
12.5000 mg | Freq: Three times a day (TID) | INTRAMUSCULAR | Status: DC | PRN
  Administered 2016-04-17: 12.5 mg via INTRAVENOUS
  Filled 2016-04-17: qty 1

## 2016-04-17 MED ORDER — ONDANSETRON HCL 4 MG/2ML IJ SOLN: 4.0000 mg | Freq: Three times a day (TID) | INTRAMUSCULAR | Status: DC | PRN

## 2016-04-17 MED ORDER — TRAZODONE HCL 50 MG PO TABS
100.0000 mg | ORAL_TABLET | Freq: Every day | ORAL | Status: DC
  Administered 2016-04-17: 100 mg via ORAL
  Filled 2016-04-17: qty 2

## 2016-04-17 MED ORDER — ONDANSETRON HCL 4 MG/2ML IJ SOLN
4.0000 mg | Freq: Four times a day (QID) | INTRAMUSCULAR | Status: DC
  Administered 2016-04-17 – 2016-04-18 (×4): 4 mg via INTRAVENOUS
  Filled 2016-04-17 (×4): qty 2

## 2016-04-17 MED ORDER — SODIUM CHLORIDE 0.9 % IV BOLUS (SEPSIS)
1000.0000 mL | Freq: Once | INTRAVENOUS | Status: AC
  Administered 2016-04-17: 1000 mL via INTRAVENOUS

## 2016-04-17 NOTE — Progress Notes (Signed)
Mr. Wayne Avila showed up today with his mother, patient is complaining of diarrhea, and states he has vomited green liquid  2 times this am. Vital signs Temp- 99.2 pulse 128-130 respiration 18 BP- 132/81. Patient is complaining of discomfort upper and right upper quadrant, " feel bad."  Patient has completed antibiotics last week, guidelines recommend that patient is off med for 2 weeks to receive Remicade treatment. Office notified of patient condition and informed family to monitor temperature, condition and continued vomiting and diarrhea. If condition persists take patient to the ER for assessment. Appointment made for next week, that will be 2 weeks after antibiotic treatment has been completed, and mother informed.

## 2016-04-17 NOTE — Telephone Encounter (Signed)
REVIEWED. SPOKE WITH KAREN. PT ALREADY DISCHARGED FROM SPECIALTY WITHOUT DISCUSSING IT WITH A MD/PA-C PT HAS T 99.54F. SEEN IN CLINIC YESTERDAY T 98.4 HR 110.  SPOKE TO DR. Roderic Palau.PT NOT IN A BED YET. GIVE PATIENT 1L FLUID AND ZOFRAN 4 MG IV. INFUSE REMICADE 5 MG/KG IV. SPOKE WITH ANN HUNT AND KAREN GIBSON. THEY ARE AWARE OF THE PLAN.  PT NEEDS FLEX SIG WED  NOV 15 Dx: ULCERATIVE COLITIS.

## 2016-04-17 NOTE — H&P (Signed)
History and Physical    Wayne Avila NTI:[id number] DOB: 1990-09-12 DOA: 04/17/2016  PCP: [name], MD  Patient coming from: Home  Chief Complaint: Abd pain  HPI: Wayne Avila is a 25 y.o. male with medical history significant of Ulcerative colitis followed by Dr. [name], depression, GERD, ADHD, chronic abd pain on narcotics, seizures on seizure meds who presents with worsening abd pain with watery diarrhea that started on the morning of hospital admission upon awakening. Symptoms did not improve. Pt was unable to tolerate PO intake. Pain described as primarily in the mid-lower quadrants, nonradiating. Per chart review, patient was seen in GI clinic on 04/16/2016 where patient reportedly had been using Imodium on a daily basis with up to 10 stools per day, keeping patient up at night. Patient has also been using multiple doses of Zofran daily during a visit, patient reported his abdominal pain persists, however is improved from 1 week prior. On 04/17/2016, patient presented back to clinic with complaints of worsening diarrhea and pain per above. Of note, patient had recently completed a course of antibiotics for C. difficile. Patient was planned for Remicade infusion 04/17/2016 per GI recommendations, however patient's family member did not agree with this and Remicade was reportedly not given. Patient was subsequently referred to the emergency department where it was then decided patient is not to receive Remicade with GI recommendations for inpatient admission for flexible sigmoidoscopy with biopsies to evaluate for CMV and/or EGD to assess hematemesis anticipated on 04/18/2016. GI also recommended continued pulse vancomycin regimen.  ED Course: [place], patient was given 1 bolus of IV fluids and continued on normal saline at basal rate of 1 25 mL per hour. Hospitalist service was consulted.  Review of Systems:  Review of Systems  Constitutional: Positive for chills and  malaise/fatigue.  HENT: Negative for ear discharge, ear pain and tinnitus.   Eyes: Negative for double vision, photophobia and discharge.  Respiratory: Negative for hemoptysis, shortness of breath, wheezing and stridor.   Cardiovascular: Negative for palpitations, orthopnea and PND.  Gastrointestinal: Positive for abdominal pain, diarrhea, nausea and vomiting.  Genitourinary: Negative for frequency and hematuria.  Musculoskeletal: Negative for falls and neck pain.  Neurological: Positive for weakness. Negative for tingling, tremors, focal weakness and loss of consciousness.  Psychiatric/Behavioral: Negative for hallucinations, memory loss and substance abuse.    Past Medical History:  Diagnosis Date  . Abdominal pain, chronic, right upper quadrant   . ADHD (attention deficit hyperactivity disorder)   . Anxiety   . Asthma   . Bipolar 1 disorder ([place])   . Colitis   . Depression   . GERD (gastroesophageal reflux disease)   . Hearing loss   . HOH (hard of hearing)   . Mental developmental delay 11/15/2012  . PONV (postoperative nausea and vomiting)   . Seizures ([place])     Past Surgical History:  Procedure Laterality Date  . ADENOIDECTOMY    . BIOPSY N/A 12/07/2012   Procedure: GASTRIC BIOPSIES;  Surgeon: [name], MD;  Location: [facility];  Service: Endoscopy;  Laterality: N/A;  . BIOPSY N/A 08/30/2013   Procedure: BIOPSY;  Surgeon: [name], MD;  Location: [facility];  Service: Endoscopy;  Laterality: N/A;  right colon,transverse colon, left colon, rectal biopsies  . BIOPSY  11/20/2015   Procedure: BIOPSY;  Surgeon: [name], MD;  Location: [facility];  Service: Endoscopy;;  ileal, right colon biopsy, left colon, rectum  . COLONOSCOPY  11/20/2015  Dr. [name]: Severe erythema, edema, ulcers from the anal verge to 20 cm above the anal verge without mucosal sparing, remaining colon and terminal ileum appeared normal. Biopsies from the rectum revealed fulminant active chronic  colitis consistent with IBD. Pathology from terminal ileum revealed intramucosal lymphoid aggregates. Remaining colon random biopsies with inactive chronic colitis  . COLONOSCOPY WITH PROPOFOL N/A 08/30/2013   BOF:BPZWCH mucosa in the terminal ileum/COLITIS/ MILD PROCTITIS. 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  Marland Kitchen COLONOSCOPY WITH PROPOFOL N/A 11/20/2015   Dr. 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 ESOPHAGOGASTRODUODENOSCOPY  11/20/2015   Dr. [name]: Normal exam, stomach biopsied and duodenal biopsy.. Benign biopsies.  . ESOPHAGOGASTRODUODENOSCOPY (EGD) WITH PROPOFOL N/A 12/07/2012   SLF:The mucosa of the esophagus appeared normal Non-erosive gastritis (inflammation) was found in the gastric antrum; multiple biopsies The duodenal mucosa showed no abnormalities in the bulb and second portion of the duodenum  . ESOPHAGOGASTRODUODENOSCOPY (EGD) WITH PROPOFOL N/A 11/20/2015   Dr. [name]: normal with normal biopsies, negative H.pylori   . TONSILLECTOMY       reports that he quit smoking about 3 years ago. He smoked 0.00 packs per day for 2.00 years. He has never used smokeless tobacco. He reports that he does not drink alcohol or use drugs.  Allergies  Allergen Reactions  . Amoxicillin-Pot Clavulanate Nausea And Vomiting and Other (See Comments)    Has patient had a PCN reaction causing immediate rash, facial/tongue/throat swelling, SOB or lightheadedness with hypotension: No Has patient had a PCN reaction causing severe rash involving mucus membranes or skin necrosis: No Has patient had a PCN reaction that required hospitalization No Has patient had a PCN reaction occurring within the last 10 years: No If all of the above answers are "NO", then may proceed with Cephalosporin use.  . Ibuprofen Other (See Comments)    Per pts MD he is unable to take  this medication.   . Omeprazole Nausea And Vomiting  . Pineapple Swelling and Other (See Comments)    Reaction:  Lip swelling  . Strawberry Extract Swelling and Other (See Comments)    Reaction:  Lip swelling  . Tomato Rash    Family History  Problem Relation Age of Onset  . Asthma Mother   . Ulcers Mother   . Bipolar disorder Mother   . ADD / ADHD Father   . Diabetes Maternal Grandmother   . Diabetes Maternal Grandfather   . Colon cancer Neg Hx   . Liver disease Neg Hx     Prior to Admission medications   Medication Sig Start Date End Date Taking? Authorizing Provider  albuterol (PROVENTIL HFA;VENTOLIN HFA) 108 (90 Base) MCG/ACT inhaler Inhale 2 puffs into the lungs every 4 (four) hours as needed for wheezing or shortness of breath (and/or cough).   Yes Historical Provider, MD  atomoxetine (STRATTERA) 100 MG capsule Take 100 mg by mouth daily. Pt takes with a 47m capsule.   Yes Historical Provider, MD  atomoxetine (STRATTERA) 25 MG capsule Take 25 mg by mouth daily. Pt takes with a 1055mcapsule.   Yes Historical Provider, MD  budesonide-formoterol (SYMBICORT) 160-4.5 MCG/ACT inhaler Inhale 2 puffs into the lungs 2 (two) times daily.   Yes Historical Provider, MD  calcium carbonate (TUMS) 500 MG chewable tablet Chew 1 tablet (200 mg of elemental calcium total) by mouth 3 (three) times daily. 04/08/16  Yes Sandi  Rexene Edison, MD  cholecalciferol (VITAMIN D) 1000 units tablet Take 1 tablet (1,000 Units total) by mouth daily. START AFTER VITAMIN D 50,000 U TABLETS ARE COMPLETE 04/08/16  Yes [name], MD  diphenoxylate-atropine (LOMOTIL) 2.5-0.025 MG tablet 1 PO BID TO REDUCE LOOSE STOOLS 04/16/16  Yes [name], MD  divalproex (DEPAKOTE) 500 MG DR tablet Take 1 tablet (500 mg total) by mouth 2 (two) times daily. 07/03/15  Yes Penni Bombard, MD  HYDROcodone-acetaminophen (NORCO/VICODIN) 5-325 MG tablet Take 1-2 tablets by mouth every 4 (four) hours as needed. 04/16/16  Yes [name], MD  loratadine (CLARITIN) 10 MG tablet Take 10 mg by mouth at bedtime.    Yes Historical Provider, MD  montelukast (SINGULAIR) 10 MG tablet Take 1 tablet (10 mg total) by mouth at bedtime. 11/13/14  Yes [name], NP  ondansetron (ZOFRAN ODT) 8 MG disintegrating tablet Take 1 tablet (8 mg total) by mouth every 8 (eight) hours as needed for nausea or vomiting. 12/25/15  Yes Jola Schmidt, MD  saccharomyces boulardii (FLORASTOR) 250 MG capsule Take 1 capsule (250 mg total) by mouth daily at 2 PM. 03/31/16  Yes Kathie Dike, MD  sertraline (ZOLOFT) 100 MG tablet Take 1 tablet (100 mg total) by mouth daily. 11/13/14  Yes [name], NP  traZODone (DESYREL) 100 MG tablet Take 100 mg by mouth at bedtime.   Yes Historical Provider, MD  Vitamin D, Ergocalciferol, (DRISDOL) 50000 units CAPS capsule Take 1 capsule (50,000 Units total) by mouth every 7 (seven) days. 04/08/16  Yes [name], MD  albuterol (PROVENTIL) (2.5 MG/3ML) 0.083% nebulizer solution Inhale 2.5 mg into the lungs every 6 (six) hours as needed for wheezing or shortness of breath.     Historical Provider, MD  EPINEPHrine (EPIPEN 2-PAK) 0.3 mg/0.3 mL IJ SOAJ injection Inject 0.3 mg into the muscle once as needed (for severe allergic reaction).    Historical Provider, MD  ondansetron (ZOFRAN) 4 MG tablet 1 PO Q4-6H PRN NAUSEA/VOMTING 04/16/16   [name], MD  promethazine (PHENERGAN) 12.5 MG tablet 1 PO BID. MAY USE ADDITIONAL DOSE EVERY 4 HOURS. NO MORE THAN 8 DOSES A DAY. Patient not taking: Reported on 04/16/2016 03/18/16   [name], MD  vancomycin (VANCOCIN) 50 mg/mL oral solution Take 125 mg po every 3 DAYS for 10 DOSES. 04/16/16   [name], MD    Physical Exam: Vitals:   04/17/16 1240 04/17/16 1349 04/17/16 1350  BP: 143/90 129/86   Pulse: (!) 130  104  Resp: 20    Temp: 98.5 F (36.9 C)    SpO2: 100%  93%  Weight: 99.3 kg (219 lb)    Height: 5' 8"  (1.727 m)      Constitutional: NAD, calm,  comfortable Vitals:   04/17/16 1240 04/17/16 1349 04/17/16 1350  BP: 143/90 129/86   Pulse: (!) 130  104  Resp: 20    Temp: 98.5 F (36.9 C)    SpO2: 100%  93%  Weight: 99.3 kg (219 lb)    Height: 5' 8"  (1.727 m)     Eyes: PERRL, lids and conjunctivae normal ENMT: Mucous membranes are moist. Posterior pharynx clear of any exudate or lesions.Normal dentition.  Neck: normal, supple, no masses, no thyromegaly Respiratory: clear to auscultation bilaterally, no wheezing, no crackles. Normal respiratory effort. No accessory muscle use.  Cardiovascular: Regular rate and rhythm, s1, s2 Abdomen: no tenderness, no masses palpated. No hepatosplenomegaly. Bowel sounds  positive.  Musculoskeletal: no clubbing / cyanosis. No joint deformity upper and lower extremities. Good ROM, no contractures. Normal muscle tone.  Skin: no rashes, lesions. No induration Neurologic: CN 2-12 grossly intact. Sensation intact, DTR normal. Strength 5/5 in all 4.  Psychiatric: Normal judgment and insight. Alert and oriented x 3. Normal mood.    Labs on Admission: I have personally reviewed following labs and imaging studies  CBC:  Recent Labs Lab 04/14/16 0949 04/17/16 1309  WBC 10.6* 8.2  HGB 11.4* 11.4*  HCT 33.4* 34.5*  MCV 84.6 85.4  PLT 316 321   Basic Metabolic Panel:  Recent Labs Lab 04/14/16 0949 04/17/16 1309  NA 136 136  K 3.4* 3.5  CL 103 105  CO2 25 25  GLUCOSE 109* 105*  BUN 5* <5*  CREATININE 0.81 0.84  CALCIUM 8.8* 8.8*   GFR: Estimated Creatinine Clearance: 156.3 mL/min (by C-G formula based on SCr of 0.84 mg/dL). Liver Function Tests:  Recent Labs Lab 04/14/16 0949 04/17/16 1309  AST 13* 16  ALT 10* 8*  ALKPHOS 45 43  BILITOT 0.3 0.3  PROT 6.5 6.7  ALBUMIN 2.8* 2.9*    Recent Labs Lab 04/14/16 0949 04/17/16 1309  LIPASE 16 22   No results for input(s): AMMONIA in the last 168 hours. Coagulation Profile: No results for input(s): INR, PROTIME in the last 168  hours. Cardiac Enzymes: No results for input(s): CKTOTAL, CKMB, CKMBINDEX, TROPONINI in the last 168 hours. BNP (last 3 results) No results for input(s): PROBNP in the last 8760 hours. HbA1C: No results for input(s): HGBA1C in the last 72 hours. CBG: No results for input(s): GLUCAP in the last 168 hours. Lipid Profile: No results for input(s): CHOL, HDL, LDLCALC, TRIG, CHOLHDL, LDLDIRECT in the last 72 hours. Thyroid Function Tests: No results for input(s): TSH, T4TOTAL, FREET4, T3FREE, THYROIDAB in the last 72 hours. Anemia Panel: No results for input(s): VITAMINB12, FOLATE, FERRITIN, TIBC, IRON, RETICCTPCT in the last 72 hours. Urine analysis:    Component Value Date/Time   COLORURINE YELLOW 04/14/2016 0901   APPEARANCEUR CLEAR 04/14/2016 0901   LABSPEC 1.025 04/14/2016 0901   PHURINE 6.0 04/14/2016 0901   GLUCOSEU NEGATIVE 04/14/2016 0901   HGBUR NEGATIVE 04/14/2016 0901   BILIRUBINUR SMALL (A) 04/14/2016 0901   KETONESUR 15 (A) 04/14/2016 0901   PROTEINUR 30 (A) 04/14/2016 0901   UROBILINOGEN 0.2 09/04/2014 2355   NITRITE NEGATIVE 04/14/2016 0901   LEUKOCYTESUR NEGATIVE 04/14/2016 0901   Sepsis Labs: !!!!!!!!!!!!!!!!!!!!!!!!!!!!!!!!!!!!!!!!!!!! @LABRCNTIP (procalcitonin:4,lacticidven:4) ) Recent Results (from the past 240 hour(s))  C difficile quick scan w PCR reflex     Status: None   Collection Time: 04/09/16  3:19 PM  Result Value Ref Range Status   C Diff antigen NEGATIVE NEGATIVE Final   C Diff toxin NEGATIVE NEGATIVE Final   C Diff interpretation No C. difficile detected.  Final  Gastrointestinal Panel by PCR , Stool     Status: None   Collection Time: 04/09/16  3:20 PM  Result Value Ref Range Status   Campylobacter species NOT DETECTED NOT DETECTED Final   Plesimonas shigelloides NOT DETECTED NOT DETECTED Final   Salmonella species NOT DETECTED NOT DETECTED Final   Yersinia enterocolitica NOT DETECTED NOT DETECTED Final   Vibrio species NOT DETECTED NOT  DETECTED Final   Vibrio cholerae NOT DETECTED NOT DETECTED Final   Enteroaggregative E coli (EAEC) NOT DETECTED NOT DETECTED Final   Enteropathogenic E coli (EPEC) NOT DETECTED NOT DETECTED Final   Enterotoxigenic E coli (  ETEC) NOT DETECTED NOT DETECTED Final   Shiga like toxin producing E coli (STEC) NOT DETECTED NOT DETECTED Final   Shigella/Enteroinvasive E coli (EIEC) NOT DETECTED NOT DETECTED Final   Cryptosporidium NOT DETECTED NOT DETECTED Final   Cyclospora cayetanensis NOT DETECTED NOT DETECTED Final   Entamoeba histolytica NOT DETECTED NOT DETECTED Final   Giardia lamblia NOT DETECTED NOT DETECTED Final   Adenovirus F40/41 NOT DETECTED NOT DETECTED Final   Astrovirus NOT DETECTED NOT DETECTED Final   Norovirus GI/GII NOT DETECTED NOT DETECTED Final   Rotavirus A NOT DETECTED NOT DETECTED Final   Sapovirus (I, II, IV, and V) NOT DETECTED NOT DETECTED Final  C difficile quick scan w PCR reflex     Status: None   Collection Time: 04/14/16  1:04 PM  Result Value Ref Range Status   C Diff antigen NEGATIVE NEGATIVE Final   C Diff toxin NEGATIVE NEGATIVE Final   C Diff interpretation No C. difficile detected.  Final     Radiological Exams on Admission: Dg Abd Acute W/chest  Result Date: 04/17/2016 CLINICAL DATA:  Nausea.  Vomiting.  Diarrhea . EXAM: DG ABDOMEN ACUTE W/ 1V CHEST COMPARISON:  03/01/2016 . FINDINGS: No acute cardiopulmonary disease. Soft tissue structures the abdomen are unremarkable. Several air-filled loops of slightly distended small bowel noted. Colonic gas pattern normal. Stool in the colon. Developing mild adynamic ileus may be present. Follow-up abdominal series can be obtained to demonstrate resolution. IMPRESSION: 1. No acute cardiopulmonary disease. 2. Mild adynamic ileus cannot be excluded. Follow-up abdominal series can be obtained to demonstrate resolution. Electronically Signed   By: Marcello Moores  Register   On: 04/17/2016 14:54    EKG: Independently reviewed.  Sinus tach  Assessment/Plan Principal Problem:   Ulcerative colitis (HCC) Active Problems:   Bipolar I disorder, most recent episode depressed (HCC)   Hypovolemia   Dehydration   Generalized abdominal pain   Nausea vomiting and diarrhea   1. Suspected acute ulcerative colitis flare 1. Present symptoms worrisome for acute colitis flare 2. Patient is well-known to the gastroenterology service, will formally consult 3. Continue patient on clear liquid diet with basal fluids 4. Chart reviewed, patient is planned for endoscopy during this hospital admission 5. Per chart review, recommendation for pulse vancomycin. Will order will keep on enteric precautions 6. Please patient on observation status in MedSurg 7. We'll recheck CBC in the morning 2. Bipolar 1 disorder 1. Appears stable at this time 2. Continue home regimen as tolerated 3. Hypovolemia/dehydration 1. Continue basal IV fluids as tolerated 2. Labs reviewed. Renal function normal 4. Abdominal pain 1. Likely secondary to above 2. Continue patient on analgesics as tolerated 5. Nausea/vomiting/diarrhea 1. Likely secondary to acute colitis 2. Plan per above  DVT prophylaxis: SCDs  Code Status: Full code Family Communication: Patient in room, family at bedside  Disposition Plan: Uncertain at this time  Consults called: Gastroenterology Admission status: Observation status, MedSurg   CHIU, Orpah Melter MD [facility] Pager [phone]  If 7PM-7AM, please contact night-coverage [email or web address] Password [facility]  04/17/2016, 3:34 PM

## 2016-04-17 NOTE — Telephone Encounter (Signed)
PENDING ED EVALUATION

## 2016-04-17 NOTE — Telephone Encounter (Signed)
1324: Pt's mother contacted. She prefers ED evaluation due to pt vomiting blood @ [facility] [place]. Called and spoke with Dr. Roderic Palau. Pt needs to be in bed ASAP due to hematemesis. No Remicade infusion today. 1328: spoke with ERIC GILL, RNP. EXPLAINED ISSUES. RECOMMEND FLEX SIG WITH BIOPSIES TO EVALUATE FOR CMV AND/OR EGD TO ASSESS HEMATEMESIS ON FRI IF PT'S ADMITTED AND ANTIEMETICS ATC. CONTINUE VANCOMYCIN PULSE REGIMEN.

## 2016-04-17 NOTE — Telephone Encounter (Signed)
Routing to Ginger to schedule ASAP  Please see note below

## 2016-04-17 NOTE — ED Notes (Signed)
Per Dr Roderic Palau, after speaking with Dr [name], patient is to remain an ER patient and not transfer to the specialty clinic.

## 2016-04-17 NOTE — ED Notes (Signed)
Dr. Roderic Palau spoke with Dr. [name] and someone from specialty clinics is coming down to get pt to take him to that dept for treatment.

## 2016-04-17 NOTE — ED Provider Notes (Signed)
[facility] DEPT Provider Note   CSN: [id number] Arrival date & time: 04/17/16  1223     History   Chief Complaint Chief Complaint  Patient presents with  . Abdominal Pain    HPI Wayne Avila is a 25 y.o. male.   Abdominal Pain      Pt was seen at 1410. Per pt's family and pt, c/o gradual onset and persistence of multiple intermittent episodes of diarrhea that has worsened over the past 3 to 4 days. Endorses N/V that began this morning. Describes the stools and emesis as "having blood in them." Has been associated with generalized abd "pain." Pt's mother states pt has recent dx cdiff, as well as GERD and chronic abd pain. Denies CP/SOB, no back pain, no fevers.     GI: Dr. [name] Past Medical History:  Diagnosis Date  . Abdominal pain, chronic, right upper quadrant   . ADHD (attention deficit hyperactivity disorder)   . Anxiety   . Asthma   . Bipolar 1 disorder ([place])   . Colitis   . Depression   . GERD (gastroesophageal reflux disease)   . Hearing loss   . HOH (hard of hearing)   . Mental developmental delay 11/15/2012  . PONV (postoperative nausea and vomiting)   . Seizures [facility])     Patient Active Problem List   Diagnosis Date Noted  . Generalized abdominal pain   . Dehydration 04/09/2016  . Abnormal thyroid function test 03/24/2016  . Acute blood loss anemia 03/23/2016  . Colitis 03/22/2016  . C. difficile colitis 03/22/2016  . Hypovolemia 03/22/2016  . Lower GI bleeding 03/22/2016  . Bipolar I disorder ([place]) 03/22/2016  . Diarrhea 03/07/2016  . Ulcerative colitis ([place]) 12/29/2015  . CAP (community acquired pneumonia)   . 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 Aspiration pneumonitis ([place]) 11/21/2015  . Chronic blood loss anemia 11/21/2015  . Bipolar I disorder, most recent episode depressed ([place])   . Bipolar 1 disorder, manic, moderate ([place]) 11/09/2014  . Suicidal ideation   . Major depressive disorder, recurrent episode, moderate ([place])   . Oral thrush 02/23/2014    . Mental developmental delay 11/15/2012    Past Surgical History:  Procedure Laterality Date  . ADENOIDECTOMY    . BIOPSY N/A 12/07/2012   Procedure: GASTRIC BIOPSIES;  Surgeon: [name], MD;  Location: [facility] ORS;  Service: Endoscopy;  Laterality: N/A;  . BIOPSY N/A 08/30/2013   Procedure: BIOPSY;  Surgeon: [name], MD;  Location: [facility] ORS;  Service: Endoscopy;  Laterality: N/A;  right colon,transverse colon, left colon, rectal biopsies  . BIOPSY  11/20/2015   Procedure: BIOPSY;  Surgeon: [name], MD;  Location: [facility] ENDO SUITE;  Service: Endoscopy;;  ileal, right colon biopsy, left colon, rectum  . COLONOSCOPY  11/20/2015   Dr. [name]: Severe erythema, edema, ulcers from the anal verge to 20 cm above the anal verge without mucosal sparing, remaining colon and terminal ileum appeared normal. Biopsies from the rectum revealed fulminant active chronic colitis consistent with IBD. Pathology from terminal ileum revealed intramucosal lymphoid aggregates. Remaining colon random biopsies with inactive chronic colitis  . COLONOSCOPY WITH PROPOFOL N/A 08/30/2013   WUJ:WJXBJY mucosa in the terminal ileum/COLITIS/ MILD PROCTITIS. 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  Marland Kitchen COLONOSCOPY WITH PROPOFOL N/A 11/20/2015   Dr. 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 ESOPHAGOGASTRODUODENOSCOPY  11/20/2015  Dr. [name]: Normal exam, stomach biopsied and duodenal biopsy.. Benign biopsies.  . ESOPHAGOGASTRODUODENOSCOPY (EGD) WITH PROPOFOL N/A 12/07/2012   SLF:The mucosa of the esophagus appeared normal Non-erosive gastritis (inflammation) was found in the gastric antrum; multiple biopsies The duodenal mucosa showed no abnormalities in the bulb and second portion of the duodenum  . ESOPHAGOGASTRODUODENOSCOPY (EGD) WITH PROPOFOL N/A 11/20/2015   Dr. [name]:  normal with normal biopsies, negative H.pylori   . TONSILLECTOMY         Home Medications    Prior to Admission medications   Medication Sig Start Date End Date Taking? Authorizing Provider  albuterol (PROVENTIL HFA;VENTOLIN HFA) 108 (90 Base) MCG/ACT inhaler Inhale 2 puffs into the lungs every 4 (four) hours as needed for wheezing or shortness of breath (and/or cough).   Yes Historical Provider, MD  atomoxetine (STRATTERA) 100 MG capsule Take 100 mg by mouth daily. Pt takes with a 85m capsule.   Yes Historical Provider, MD  atomoxetine (STRATTERA) 25 MG capsule Take 25 mg by mouth daily. Pt takes with a 1088mcapsule.   Yes Historical Provider, MD  budesonide-formoterol (SYMBICORT) 160-4.5 MCG/ACT inhaler Inhale 2 puffs into the lungs 2 (two) times daily.   Yes Historical Provider, MD  calcium carbonate (TUMS) 500 MG chewable tablet Chew 1 tablet (200 mg of elemental calcium total) by mouth 3 (three) times daily. 04/08/16  Yes SaDanie BinderMD  cholecalciferol (VITAMIN D) 1000 units tablet Take 1 tablet (1,000 Units total) by mouth daily. START AFTER VITAMIN D 50,000 U TABLETS ARE COMPLETE 04/08/16  Yes SaDanie BinderMD  diphenoxylate-atropine (LOMOTIL) 2.5-0.025 MG tablet 1 PO BID TO REDUCE LOOSE STOOLS 04/16/16  Yes SaDanie BinderMD  divalproex (DEPAKOTE) 500 MG DR tablet Take 1 tablet (500 mg total) by mouth 2 (two) times daily. 07/03/15  Yes ViPenni BombardMD  HYDROcodone-acetaminophen (NORCO/VICODIN) 5-325 MG tablet Take 1-2 tablets by mouth every 4 (four) hours as needed. 04/16/16  Yes SaDanie BinderMD  loratadine (CLARITIN) 10 MG tablet Take 10 mg by mouth at bedtime.    Yes Historical Provider, MD  montelukast (SINGULAIR) 10 MG tablet Take 1 tablet (10 mg total) by mouth at bedtime. 11/13/14  Yes LaNiel HummerNP  ondansetron (ZOFRAN ODT) 8 MG disintegrating tablet Take 1 tablet (8 mg total) by mouth every 8 (eight) hours as needed for nausea or vomiting. 12/25/15  Yes KeJola SchmidtMD  saccharomyces boulardii (FLORASTOR) 250 MG capsule Take 1 capsule (250 mg total) by mouth daily at 2 PM. 03/31/16  Yes JeKathie DikeMD  sertraline (ZOLOFT) 100 MG tablet Take 1 tablet (100 mg total) by mouth daily. 11/13/14  Yes LaNiel HummerNP  traZODone (DESYREL) 100 MG tablet Take 100 mg by mouth at bedtime.   Yes Historical Provider, MD  Vitamin D, Ergocalciferol, (DRISDOL) 50000 units CAPS capsule Take 1 capsule (50,000 Units total) by mouth every 7 (seven) days. 04/08/16  Yes SaDanie BinderMD  albuterol (PROVENTIL) (2.5 MG/3ML) 0.083% nebulizer solution Inhale 2.5 mg into the lungs every 6 (six) hours as needed for wheezing or shortness of breath.     Historical Provider, MD  EPINEPHrine (EPIPEN 2-PAK) 0.3 mg/0.3 mL IJ SOAJ injection Inject 0.3 mg into the muscle once as needed (for severe allergic reaction).    Historical Provider, MD  ondansetron (ZOFRAN) 4 MG tablet 1 PO Q4-6H PRN NAUSEA/VOMTING 04/16/16   SaDanie BinderMD  promethazine (PHENERGAN) 12.5 MG tablet 1  PO BID. MAY USE ADDITIONAL DOSE EVERY 4 HOURS. NO MORE THAN 8 DOSES A DAY. Patient not taking: Reported on 04/16/2016 03/18/16   [name], MD  vancomycin (VANCOCIN) 50 mg/mL oral solution Take 125 mg po every 3 DAYS for 10 DOSES. 04/16/16   [name], MD    Family History Family History  Problem Relation Age of Onset  . Asthma Mother   . Ulcers Mother   . Bipolar disorder Mother   . ADD / ADHD Father   . Diabetes Maternal Grandmother   . Diabetes Maternal Grandfather   . Colon cancer Neg Hx   . Liver disease Neg Hx     Social History Social History  Substance Use Topics  . Smoking status: Former Smoker    Packs/day: 0.00    Years: 2.00    Quit date: 10/06/2012  . Smokeless tobacco: Never Used     Comment: Never smoked cigarettes  . Alcohol use No     Comment: denies usage     Allergies   Amoxicillin-pot clavulanate; Ibuprofen; Omeprazole; Pineapple; Strawberry extract; and  Tomato   Review of Systems Review of Systems  Gastrointestinal: Positive for abdominal pain.  ROS: Statement: All systems negative except as marked or noted in the HPI; Constitutional: Negative for fever and chills. ; ; Eyes: Negative for eye pain, redness and discharge. ; ; ENMT: Negative for ear pain, hoarseness, nasal congestion, sinus pressure and sore throat. ; ; Cardiovascular: Negative for chest pain, palpitations, diaphoresis, dyspnea and peripheral edema. ; ; Respiratory: Negative for cough, wheezing and stridor. ; ; Gastrointestinal: Negative for jaundice. +N/V/D, abd pain, blood in stools and emesis. . ; ; Genitourinary: Negative for dysuria, flank pain and hematuria. ; ; Musculoskeletal: Negative for back pain and neck pain. Negative for swelling and trauma.; ; Skin: Negative for pruritus, rash, abrasions, blisters, bruising and skin lesion.; ; Neuro: Negative for headache, lightheadedness and neck stiffness. Negative for weakness, altered level of consciousness, altered mental status, extremity weakness, paresthesias, involuntary movement, seizure and syncope.        Physical Exam Updated Vital Signs BP 129/86   Pulse 104   Temp 98.5 F (36.9 C)   Resp 20   Ht 5' 8"  (1.727 m)   Wt 219 lb (99.3 kg)   SpO2 93%   BMI 33.30 kg/m   Physical Exam 1415: Physical examination:  Nursing notes reviewed; Vital signs and O2 SAT reviewed;  Constitutional: Well developed, Well nourished, Well hydrated, In no acute distress; Head:  Normocephalic, atraumatic; Eyes: EOMI, PERRL, No scleral icterus; ENMT: Mouth and pharynx normal, Mucous membranes moist; Neck: Supple, Full range of motion, No lymphadenopathy; Cardiovascular: Tachycardic rate and rhythm, No gallop; Respiratory: Breath sounds clear & equal bilaterally, No wheezes.  Speaking full sentences with ease, Normal respiratory effort/excursion; Chest: Nontender, Movement normal; Abdomen: Soft, +mild diffuse tenderness to palp. No rebound  or guarding. Nondistended, Normal bowel sounds; Genitourinary: No CVA tenderness; Extremities: Pulses normal, No tenderness, No edema, No calf edema or asymmetry.; Neuro: AA&Ox3, Major CN grossly intact.  Speech clear. No gross focal motor or sensory deficits in extremities.; Skin: Color normal, Warm, Dry.   ED Treatments / Results  Labs (all labs ordered are listed, but only abnormal results are displayed)   EKG  EKG Interpretation  Date/Time:  Thursday April 17 2016 12:42:29 EST Ventricular Rate:  133 PR Interval:  144 QRS Duration: 84 QT Interval:  286 QTC Calculation: 425 R Axis:   8 Text  Interpretation:  Sinus tachycardia Nonspecific T wave abnormality When compared with ECG of 04/05/2016 No significant change was found Confirmed by [facility]  MD, Nunzio Cory [id number]) on 04/17/2016 2:23:41 PM       Radiology   Procedures Procedures (including critical care time)  Medications Ordered in ED Medications - No data to display   Initial Impression / Assessment and Plan / ED Course  I have reviewed the triage vital signs and the nursing notes.  Pertinent labs & imaging results that were available during my care of the patient were reviewed by me and considered in my medical decision making (see chart for details).  MDM Reviewed: previous chart, nursing note and vitals Reviewed previous: labs and CT scan Interpretation: labs and x-ray   Results for orders placed or performed during the hospital encounter of 04/17/16  Lipase, blood  Result Value Ref Range   Lipase 22 11 - 51 U/L  Comprehensive metabolic panel  Result Value Ref Range   Sodium 136 135 - 145 mmol/L   Potassium 3.5 3.5 - 5.1 mmol/L   Chloride 105 101 - 111 mmol/L   CO2 25 22 - 32 mmol/L   Glucose, Bld 105 (H) 65 - 99 mg/dL   BUN <5 (L) 6 - 20 mg/dL   Creatinine, Ser 0.84 0.61 - 1.24 mg/dL   Calcium 8.8 (L) 8.9 - 10.3 mg/dL   Total Protein 6.7 6.5 - 8.1 g/dL   Albumin 2.9 (L) 3.5 - 5.0 g/dL   AST 16 15  - 41 U/L   ALT 8 (L) 17 - 63 U/L   Alkaline Phosphatase 43 38 - 126 U/L   Total Bilirubin 0.3 0.3 - 1.2 mg/dL   GFR calc non Af Amer >60 >60 mL/min   GFR calc Af Amer >60 >60 mL/min   Anion gap 6 5 - 15  CBC  Result Value Ref Range   WBC 8.2 4.0 - 10.5 K/uL   RBC 4.04 (L) 4.22 - 5.81 MIL/uL   Hemoglobin 11.4 (L) 13.0 - 17.0 g/dL   HCT 34.5 (L) 39.0 - 52.0 %   MCV 85.4 78.0 - 100.0 fL   MCH 28.2 26.0 - 34.0 pg   MCHC 33.0 30.0 - 36.0 g/dL   RDW 14.6 11.5 - 15.5 %   Platelets 341 150 - 400 K/uL   Dg Abd Acute W/chest Result Date: 04/17/2016 CLINICAL DATA:  Nausea.  Vomiting.  Diarrhea . EXAM: DG ABDOMEN ACUTE W/ 1V CHEST COMPARISON:  03/01/2016 . FINDINGS: No acute cardiopulmonary disease. Soft tissue structures the abdomen are unremarkable. Several air-filled loops of slightly distended small bowel noted. Colonic gas pattern normal. Stool in the colon. Developing mild adynamic ileus may be present. Follow-up abdominal series can be obtained to demonstrate resolution. IMPRESSION: 1. No acute cardiopulmonary disease. 2. Mild adynamic ileus cannot be excluded. Follow-up abdominal series can be obtained to demonstrate resolution. Electronically Signed   By: Marcello Moores  Register   On: 04/17/2016 14:54    1450:   Pt's GI MD called the ED: pt was due for Remicade infusion, but was diverted to the ED for GI bleeding, will need admit for flex sig/EGD.  Dx and testing d/w pt and family.  Questions answered.  Verb understanding, agreeable to admit.  T/C to [facility] Dr. Wyline Copas, case discussed, including:  HPI, pertinent PM/SHx, VS/PE, dx testing, ED course and treatment:  Agreeable to admit, requests to write temporary orders, obtain medical bed to team [facility].    Final Clinical Impressions(s) /  ED Diagnoses   Final diagnoses:  None    New Prescriptions New Prescriptions   No medications on file      Francine Graven, DO 04/20/16 1836

## 2016-04-17 NOTE — ED Triage Notes (Signed)
Pt reports nausea , vomiting and diarrhea that started this am.

## 2016-04-17 NOTE — Telephone Encounter (Signed)
Candy Sledge, RN from [facility] called to say that patient has a heart rate of 130, temp 99.2 and has diarrhea/vomiting. She is sending him home and not doing his remicade today and will schedule for next week. She said if he doesn't get any better that she is sending him to the ER. Patient was seen by [place] yesterday.

## 2016-04-17 NOTE — Progress Notes (Signed)
Received a phone call from Dr. [name] at [phone] and she wanted the remicade infusion to be done. Callled patient and his mother stated they were in the emergency room and that Lennette Bihari started vomiting some blood. Offered to do the infusion but his mother did not think it was a good idea.

## 2016-04-17 NOTE — Consult Note (Signed)
Referring Provider: No ref. provider found Primary Care Physician:  [name], MD Primary Gastroenterologist:  Dr. [name]  Date of Admission: 04/17/16 Date of Consultation: 04/17/16  Reason for Consultation:  Hematemesis, diarrhea, UC  HPI:  Wayne Avila is a 25 y.o. male well known to our service with a past medical history of Ulcerative colitis, depression, GERD, ADHD, chronic abd pain on narcotics, seizures, anxiety, bipolar, mental developmental delay. He has been admitted twice in the past month (today is the third admission) for UC and refractory C. Diff.   Last colonoscopy was in June 2017. He had chronic inactive pancolitis and active severe ulcerative proctitis at that time. Patient initially diagnosed with C. difficile back in August. Treated with Flagyl 500 mg 3 times a day for 14 days. Patient was retested on October 6 due to worsening diarrhea. C. difficile PCR was positive again and at that time he was prescribed Flagyl again because the only after hours pharmacy open did not have vancomycin in stock. Patient was seen in the ED on October 11 with persistent symptoms and at that time he was switched to vancomycin 125 mg 4 times a day although he required a prior authorization which was done October 12 and approved.  Admitted 03/22/16 for C. Diff with positive PCR treated with IVF, oral vancomycin, po Flagyl, and Questran with symptom improvement. D/C on 2 weeks po vancomycin followed by vancomycin taper and Florastor.  Readmitted for watery diarrhea concerning for refractory C. Diff on 04/09/16 with noted negative C. Diff PCR at which point vancomycin/flagyl was discontinued. GI path panel negative, unsure of etiology of his presenting symptoms (post-infectious IBS vs UC vs multifactorial). He was not d/c on any antibiotics.  Today he states he saw Dr. [name] in the office yesterday for 10+ watery stools a day. He was started back on guideline recommended Vancomycin pulse (125 mg  every 3 days for 10 total doses), Zofran, Lomotil, and Imodium. Was due for Remicade infusion today but [facility] referred him to the ER when they noted HR 130 and temp 99.2. They were instructed to retrieve the patient, give IVF, Zofran, and Remicade. However, while in the ER the patient had hematemesis and elected to remain for ER evaluation and possible admission.   He states he has been vomiting all day, hematemesis was minimal amount of blood noted. Loose stools yesterday and family noted blood stain in the toilet. No noted blood today, but continues with multiple loose stools today. His last bowel movement was yesterday. He did take a pulse dose of Vanco yesterday. He is also having generalized moderate to severe abdominal tenderness. Denies black stools.  EXAM: CT ABDOMEN AND PELVIS WITH CONTRAST FINDINGS: Lower chest: Lung bases are clear.  Hepatobiliary: No focal liver abnormality is seen. No gallstones, gallbladder wall thickening, or biliary dilatation.  Pancreas: Unremarkable. No pancreatic ductal dilatation or surrounding inflammatory changes.  Spleen: Normal in size without focal abnormality.  Adrenals/Urinary Tract: Adrenal glands are unremarkable. Kidneys are normal, without renal calculi, focal lesion, or hydronephrosis. Bladder is unremarkable.  Stomach/Bowel: Scattered colonic wall thickening involving the sigmoid colon, splenic flexure, and descending colon. No small bowel dilatation. Stomach is physiologically distended. Appendix is normal.  Vascular/Lymphatic: No significant vascular findings are present. No enlarged abdominal or pelvic lymph nodes.  Reproductive: Prostate is unremarkable.  Other: No abdominal wall hernia or abnormality. No abdominopelvic ascites.  Musculoskeletal: There are no acute or suspicious osseous abnormalities.  IMPRESSION: Scattered colonic wall thickening most  prominent in the sigmoid colon, consistent with  colitis.  Past Medical History:  Diagnosis Date  . Abdominal pain, chronic, right upper quadrant   . ADHD (attention deficit hyperactivity disorder)   . Anxiety   . Asthma   . Bipolar 1 disorder ([place])   . Colitis   . Depression   . GERD (gastroesophageal reflux disease)   . Hearing loss   . HOH (hard of hearing)   . Mental developmental delay 11/15/2012  . PONV (postoperative nausea and vomiting)   . Seizures ([place])     Past Surgical History:  Procedure Laterality Date  . ADENOIDECTOMY    . BIOPSY N/A 12/07/2012   Procedure: GASTRIC BIOPSIES;  Surgeon: [name], MD;  Location: [facility];  Service: Endoscopy;  Laterality: N/A;  . BIOPSY N/A 08/30/2013   Procedure: BIOPSY;  Surgeon: [name], MD;  Location: [facility];  Service: Endoscopy;  Laterality: N/A;  right colon,transverse colon, left colon, rectal biopsies  . BIOPSY  11/20/2015   Procedure: BIOPSY;  Surgeon: [name], MD;  Location: [facility];  Service: Endoscopy;;  ileal, right colon biopsy, left colon, rectum  . COLONOSCOPY  11/20/2015   Dr. [name]: Severe erythema, edema, ulcers from the anal verge to 20 cm above the anal verge without mucosal sparing, remaining colon and terminal ileum appeared normal. Biopsies from the rectum revealed fulminant active chronic colitis consistent with IBD. Pathology from terminal ileum revealed intramucosal lymphoid aggregates. Remaining colon random biopsies with inactive chronic colitis  . COLONOSCOPY WITH PROPOFOL N/A 08/30/2013   KXF:GHWEXH mucosa in the terminal ileum/COLITIS/ MILD PROCTITIS. 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  Marland Kitchen COLONOSCOPY WITH PROPOFOL N/A 11/20/2015   Dr. 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 ESOPHAGOGASTRODUODENOSCOPY  11/20/2015   Dr. [name]: Normal exam, stomach biopsied and duodenal biopsy..  Benign biopsies.  . ESOPHAGOGASTRODUODENOSCOPY (EGD) WITH PROPOFOL N/A 12/07/2012   SLF:The mucosa of the esophagus appeared normal Non-erosive gastritis (inflammation) was found in the gastric antrum; multiple biopsies The duodenal mucosa showed no abnormalities in the bulb and second portion of the duodenum  . ESOPHAGOGASTRODUODENOSCOPY (EGD) WITH PROPOFOL N/A 11/20/2015   Dr. [name]: normal with normal biopsies, negative H.pylori   . TONSILLECTOMY      Prior to Admission medications   Medication Sig Start Date End Date Taking? Authorizing Provider  albuterol (PROVENTIL HFA;VENTOLIN HFA) 108 (90 Base) MCG/ACT inhaler Inhale 2 puffs into the lungs every 4 (four) hours as needed for wheezing or shortness of breath (and/or cough).   Yes Historical Provider, MD  atomoxetine (STRATTERA) 100 MG capsule Take 100 mg by mouth daily. Pt takes with a 20m capsule.   Yes Historical Provider, MD  atomoxetine (STRATTERA) 25 MG capsule Take 25 mg by mouth daily. Pt takes with a 1075mcapsule.   Yes Historical Provider, MD  budesonide-formoterol (SYMBICORT) 160-4.5 MCG/ACT inhaler Inhale 2 puffs into the lungs 2 (two) times daily.   Yes Historical Provider, MD  calcium carbonate (TUMS) 500 MG chewable tablet Chew 1 tablet (200 mg of elemental calcium total) by mouth 3 (three) times daily. 04/08/16  Yes SaDanie BinderMD  cholecalciferol (VITAMIN D) 1000 units tablet Take 1 tablet (1,000 Units total) by mouth daily. START AFTER VITAMIN D 50,000 U TABLETS ARE COMPLETE 04/08/16  Yes SaDanie BinderMD  diphenoxylate-atropine (LOMOTIL) 2.5-0.025 MG tablet 1 PO BID  TO REDUCE LOOSE STOOLS 04/16/16  Yes [name], MD  divalproex (DEPAKOTE) 500 MG DR tablet Take 1 tablet (500 mg total) by mouth 2 (two) times daily. 07/03/15  Yes Penni Bombard, MD  HYDROcodone-acetaminophen (NORCO/VICODIN) 5-325 MG tablet Take 1-2 tablets by mouth every 4 (four) hours as needed. 04/16/16  Yes [name], MD  loratadine  (CLARITIN) 10 MG tablet Take 10 mg by mouth at bedtime.    Yes Historical Provider, MD  montelukast (SINGULAIR) 10 MG tablet Take 1 tablet (10 mg total) by mouth at bedtime. 11/13/14  Yes [name], NP  ondansetron (ZOFRAN ODT) 8 MG disintegrating tablet Take 1 tablet (8 mg total) by mouth every 8 (eight) hours as needed for nausea or vomiting. 12/25/15  Yes Jola Schmidt, MD  saccharomyces boulardii (FLORASTOR) 250 MG capsule Take 1 capsule (250 mg total) by mouth daily at 2 PM. 03/31/16  Yes Kathie Dike, MD  sertraline (ZOLOFT) 100 MG tablet Take 1 tablet (100 mg total) by mouth daily. 11/13/14  Yes [name], NP  traZODone (DESYREL) 100 MG tablet Take 100 mg by mouth at bedtime.   Yes Historical Provider, MD  Vitamin D, Ergocalciferol, (DRISDOL) 50000 units CAPS capsule Take 1 capsule (50,000 Units total) by mouth every 7 (seven) days. 04/08/16  Yes [name], MD  albuterol (PROVENTIL) (2.5 MG/3ML) 0.083% nebulizer solution Inhale 2.5 mg into the lungs every 6 (six) hours as needed for wheezing or shortness of breath.     Historical Provider, MD  EPINEPHrine (EPIPEN 2-PAK) 0.3 mg/0.3 mL IJ SOAJ injection Inject 0.3 mg into the muscle once as needed (for severe allergic reaction).    Historical Provider, MD  ondansetron (ZOFRAN) 4 MG tablet 1 PO Q4-6H PRN NAUSEA/VOMTING 04/16/16   [name], MD  promethazine (PHENERGAN) 12.5 MG tablet 1 PO BID. MAY USE ADDITIONAL DOSE EVERY 4 HOURS. NO MORE THAN 8 DOSES A DAY. Patient not taking: Reported on 04/16/2016 03/18/16   [name], MD  vancomycin (VANCOCIN) 50 mg/mL oral solution Take 125 mg po every 3 DAYS for 10 DOSES. 04/16/16   [name], MD    Current Facility-Administered Medications  Medication Dose Route Frequency Provider Last Rate Last Dose  . 0.9 %  sodium chloride infusion   Intravenous Continuous Donne Hazel, MD      . albuterol (PROVENTIL) (2.5 MG/3ML) 0.083% nebulizer solution 2.5 mg  2.5 mg Inhalation Q4H PRN  Donne Hazel, MD      . atomoxetine (STRATTERA) capsule 100 mg  100 mg Oral Daily Donne Hazel, MD      . atomoxetine (STRATTERA) capsule 25 mg  25 mg Oral Daily Donne Hazel, MD      . calcium carbonate (TUMS - dosed in mg elemental calcium) chewable tablet 200 mg of elemental calcium  1 tablet Oral TID Donne Hazel, MD      . cholecalciferol (VITAMIN D) tablet 1,000 Units  1,000 Units Oral Daily Donne Hazel, MD      . divalproex (DEPAKOTE) DR tablet 500 mg  500 mg Oral BID Donne Hazel, MD      . mometasone-formoterol [facility]) 200-5 MCG/ACT inhaler 2 puff  2 puff Inhalation BID Donne Hazel, MD      . montelukast (SINGULAIR) tablet 10 mg  10 mg Oral QHS Donne Hazel, MD      . morphine 2 MG/ML injection 2 mg  2 mg Intravenous  Q4H PRN Donne Hazel, MD   2 mg at 04/17/16 1554  . saccharomyces boulardii (FLORASTOR) capsule 250 mg  250 mg Oral Q1400 Donne Hazel, MD      . sertraline (ZOLOFT) tablet 100 mg  100 mg Oral Daily Donne Hazel, MD      . traZODone (DESYREL) tablet 100 mg  100 mg Oral QHS Donne Hazel, MD      . vancomycin (VANCOCIN) 50 mg/mL oral solution 125 mg  125 mg Oral Q6H Donne Hazel, MD       Current Outpatient Prescriptions  Medication Sig Dispense Refill  . albuterol (PROVENTIL HFA;VENTOLIN HFA) 108 (90 Base) MCG/ACT inhaler Inhale 2 puffs into the lungs every 4 (four) hours as needed for wheezing or shortness of breath (and/or cough).    Marland Kitchen atomoxetine (STRATTERA) 100 MG capsule Take 100 mg by mouth daily. Pt takes with a 5m capsule.    .Marland Kitchenatomoxetine (STRATTERA) 25 MG capsule Take 25 mg by mouth daily. Pt takes with a 1033mcapsule.    . budesonide-formoterol (SYMBICORT) 160-4.5 MCG/ACT inhaler Inhale 2 puffs into the lungs 2 (two) times daily.    . calcium carbonate (TUMS) 500 MG chewable tablet Chew 1 tablet (200 mg of elemental calcium total) by mouth 3 (three) times daily. 90 tablet 11  . cholecalciferol (VITAMIN D) 1000 units tablet Take 1  tablet (1,000 Units total) by mouth daily. START AFTER VITAMIN D 50,000 U TABLETS ARE COMPLETE 30 tablet 11  . diphenoxylate-atropine (LOMOTIL) 2.5-0.025 MG tablet 1 PO BID TO REDUCE LOOSE STOOLS 60 tablet 1  . divalproex (DEPAKOTE) 500 MG DR tablet Take 1 tablet (500 mg total) by mouth 2 (two) times daily. 60 tablet 2  . HYDROcodone-acetaminophen (NORCO/VICODIN) 5-325 MG tablet Take 1-2 tablets by mouth every 4 (four) hours as needed. 45 tablet 0  . loratadine (CLARITIN) 10 MG tablet Take 10 mg by mouth at bedtime.     . montelukast (SINGULAIR) 10 MG tablet Take 1 tablet (10 mg total) by mouth at bedtime. 30 tablet 3  . ondansetron (ZOFRAN ODT) 8 MG disintegrating tablet Take 1 tablet (8 mg total) by mouth every 8 (eight) hours as needed for nausea or vomiting. 10 tablet 0  . saccharomyces boulardii (FLORASTOR) 250 MG capsule Take 1 capsule (250 mg total) by mouth daily at 2 PM. 30 capsule 1  . sertraline (ZOLOFT) 100 MG tablet Take 1 tablet (100 mg total) by mouth daily. 30 tablet 0  . traZODone (DESYREL) 100 MG tablet Take 100 mg by mouth at bedtime.    . Vitamin D, Ergocalciferol, (DRISDOL) 50000 units CAPS capsule Take 1 capsule (50,000 Units total) by mouth every 7 (seven) days. 8 capsule 0  . albuterol (PROVENTIL) (2.5 MG/3ML) 0.083% nebulizer solution Inhale 2.5 mg into the lungs every 6 (six) hours as needed for wheezing or shortness of breath.   11  . EPINEPHrine (EPIPEN 2-PAK) 0.3 mg/0.3 mL IJ SOAJ injection Inject 0.3 mg into the muscle once as needed (for severe allergic reaction).    . ondansetron (ZOFRAN) 4 MG tablet 1 PO Q4-6H PRN NAUSEA/VOMTING 90 tablet 0  . promethazine (PHENERGAN) 12.5 MG tablet 1 PO BID. MAY USE ADDITIONAL DOSE EVERY 4 HOURS. NO MORE THAN 8 DOSES A DAY. (Patient not taking: Reported on 04/16/2016) 30 tablet 0  . vancomycin (VANCOCIN) 50 mg/mL oral solution Take 125 mg po every 3 DAYS for 10 DOSES. 50 mL 0   Facility-Administered Medications Ordered in  Other  Encounters  Medication Dose Route Frequency Provider Last Rate Last Dose  . 0.9 %  sodium chloride infusion   Intravenous Continuous [name], MD      . inFLIXimab (REMICADE) 5 mg/kg = 500 mg in sodium chloride 0.9 % 250 mL infusion  5 mg/kg Intravenous Once [name], MD        Allergies as of 04/17/2016 - Review Complete 04/17/2016  Allergen Reaction Noted  . Amoxicillin-pot clavulanate Nausea And Vomiting and Other (See Comments) 02/28/2011  . Ibuprofen Other (See Comments) 07/03/2015  . Omeprazole Nausea And Vomiting 04/20/2013  . Pineapple Swelling and Other (See Comments) 04/14/2011  . Strawberry extract Swelling and Other (See Comments) 05/08/2012  . Tomato Rash 04/14/2011    Family History  Problem Relation Age of Onset  . Asthma Mother   . Ulcers Mother   . Bipolar disorder Mother   . ADD / ADHD Father   . Diabetes Maternal Grandmother   . Diabetes Maternal Grandfather   . Colon cancer Neg Hx   . Liver disease Neg Hx     Social History   Social History  . Marital status: Single    Spouse name: N/A  . Number of children: 0  . Years of education: 12th   Occupational History  .  Not Employed    not working   Social History Main Topics  . Smoking status: Former Smoker    Packs/day: 0.00    Years: 2.00    Quit date: 10/06/2012  . Smokeless tobacco: Never Used     Comment: Never smoked cigarettes  . Alcohol use No     Comment: denies usage  . Drug use: No  . Sexual activity: Yes   Other Topics Concern  . Not on file   Social History Narrative   Patient lives at home with girlfriend and family.   Caffeine Use: occasionally    Review of Systems: General: Negative for anorexia, weight loss, fever, chills. Eyes: Negative for vision changes.  ENT: Negative for hoarseness, difficulty swallowing. CV: Negative for chest pain, angina, palpitations, peripheral edema.  Respiratory: Negative for dyspnea at rest, cough, sputum, wheezing.  GI: See  history of present illness. Derm: Negative for rash or itching.  Endo: Negative for unusual weight change.  Heme: Negative for bruising or bleeding. Allergy: Negative for rash or hives.  Physical Exam: Vital signs in last 24 hours: Temp:  [98.5 F (36.9 C)-99.2 F (37.3 C)] 98.5 F (36.9 C) (11/09 1240) Pulse Rate:  [104-130] 108 (11/09 1530) Resp:  [20] 20 (11/09 1240) BP: (127-143)/(81-95) 132/86 (11/09 1600) SpO2:  [93 %-100 %] 100 % (11/09 1530) Weight:  [219 lb (99.3 kg)] 219 lb (99.3 kg) (11/09 1240)   General:   Alert, Well-developed, well-nourished, pleasant and cooperative in NAD. Reserved and minimally conversational. Accompanied by mother and another family member. Head:  Normocephalic and atraumatic. Eyes:  Sclera clear, no icterus. Conjunctiva pink. Ears:  Normal auditory acuity. Neck:  Supple; no masses or thyromegaly. Lungs:  Clear throughout to auscultation.   No wheezes, crackles, or rhonchi. No acute distress. Heart:  Regular rate and rhythm; no murmurs, clicks, rubs,  or gallops. Abdomen:  Soft,and nondistended. Moderate to significant generalized TTP. No masses, hepatosplenomegaly or hernias noted. Normal bowel sounds, without guarding, and without rebound.   Rectal:  Deferred until time of colonoscopy.   Msk:  Symmetrical without gross deformities. Pulses:  Normal bilateral pedal pulses noted. Extremities:  Without clubbing or  edema. Neurologic:  Alert and  oriented x4;  grossly normal neurologically. Psych:  Alert and cooperative. Flat affect.  Intake/Output from previous day: No intake/output data recorded. Intake/Output this shift: No intake/output data recorded.  Lab Results:  Recent Labs  04/17/16 1309  WBC 8.2  HGB 11.4*  HCT 34.5*  PLT 341   BMET  Recent Labs  04/17/16 1309  NA 136  K 3.5  CL 105  CO2 25  GLUCOSE 105*  BUN <5*  CREATININE 0.84  CALCIUM 8.8*   LFT  Recent Labs  04/17/16 1309  PROT 6.7  ALBUMIN 2.9*  AST  16  ALT 8*  ALKPHOS 43  BILITOT 0.3   PT/INR No results for input(s): LABPROT, INR in the last 72 hours. Hepatitis Panel No results for input(s): HEPBSAG, HCVAB, HEPAIGM, HEPBIGM in the last 72 hours. C-Diff No results for input(s): CDIFFTOX in the last 72 hours.  Studies/Results: Dg Abd Acute W/chest  Result Date: 04/17/2016 CLINICAL DATA:  Nausea.  Vomiting.  Diarrhea . EXAM: DG ABDOMEN ACUTE W/ 1V CHEST COMPARISON:  03/01/2016 . FINDINGS: No acute cardiopulmonary disease. Soft tissue structures the abdomen are unremarkable. Several air-filled loops of slightly distended small bowel noted. Colonic gas pattern normal. Stool in the colon. Developing mild adynamic ileus may be present. Follow-up abdominal series can be obtained to demonstrate resolution. IMPRESSION: 1. No acute cardiopulmonary disease. 2. Mild adynamic ileus cannot be excluded. Follow-up abdominal series can be obtained to demonstrate resolution. Electronically Signed   By: Marcello Moores  Register   On: 04/17/2016 14:54    Impression: Very complicated 25 year old male with a history of very poorly controlled pancolonic UC and refractory C. Diff. He has had multiple admissions for C. Diff, bloody, watery stools. Has an appointment scheduled at [facility] UC clinic 11/20 (impairative that he make this appointment.) He presentes today from specialty clinic after outpatient evaluation yesterday with 10+ loose, intermittently bloody, stools a day for the past few days as well as N/V with mild hematemesis, and abdominal pain.   Mildly tachycardic, BP wnl today. AFebrile in the ER. Labs in the ER show stable hgb 11.4, CMP with hypoalbuminemia otherwise essentially normal, normal lipase. CT abdomen/pelvis outlined below. Differentials for hematemesis include MW tear (in setting of recurrent vomiting), less likely esophagitis, gastritis, duodenitis, doubtful ulceration. His watery, bloody stools likely refractory UC and/or refractory C. Diff.    Plan: 1. Scheduled antiemetics 2. NPO (vomiting and procedure scheduled tomorrow) 3. EGD with flexible sigmoidoscopy tomorrow with colon biopsies for possible CMV. 4. Should receive Remicade infusion as soon as possible 5. If no CMV, consider steroids for likely UC exacerbation   Thank you for allowing [place] to participate in the care of Sanjuana Kava, DNP, AGNP-C Adult & Gerontological Nurse Practitioner [facility]    LOS: 0 days     04/17/2016, 4:17 PM

## 2016-04-18 ENCOUNTER — Encounter (HOSPITAL_COMMUNITY)
Admission: EM | Disposition: A | Discharge status: Home / Self Care | Payer: Self-pay | Source: Home / Self Care | Attending: Emergency Medicine

## 2016-04-18 ENCOUNTER — Observation Stay (HOSPITAL_COMMUNITY): Payer: Medicaid Other | Admitting: Anesthesiology

## 2016-04-18 ENCOUNTER — Encounter (HOSPITAL_COMMUNITY): Payer: Self-pay | Admitting: *Deleted

## 2016-04-18 DIAGNOSIS — R197 Diarrhea, unspecified: Secondary | ICD-10-CM | POA: Diagnosis not present

## 2016-04-18 DIAGNOSIS — K51011 Ulcerative (chronic) pancolitis with rectal bleeding: Secondary | ICD-10-CM | POA: Diagnosis not present

## 2016-04-18 DIAGNOSIS — E860 Dehydration: Secondary | ICD-10-CM | POA: Diagnosis not present

## 2016-04-18 DIAGNOSIS — E861 Hypovolemia: Secondary | ICD-10-CM | POA: Diagnosis not present

## 2016-04-18 DIAGNOSIS — F319 Bipolar disorder, unspecified: Secondary | ICD-10-CM | POA: Diagnosis not present

## 2016-04-18 DIAGNOSIS — R1084 Generalized abdominal pain: Secondary | ICD-10-CM | POA: Diagnosis not present

## 2016-04-18 DIAGNOSIS — K5190 Ulcerative colitis, unspecified, without complications: Secondary | ICD-10-CM | POA: Diagnosis not present

## 2016-04-18 HISTORY — PX: ESOPHAGOGASTRODUODENOSCOPY (EGD) WITH PROPOFOL: SHX5813

## 2016-04-18 HISTORY — PX: BIOPSY: SHX5522

## 2016-04-18 HISTORY — PX: FLEXIBLE SIGMOIDOSCOPY: SHX5431

## 2016-04-18 LAB — COMPREHENSIVE METABOLIC PANEL
ALT: 9 U/L — ABNORMAL LOW (ref 17–63)
AST: 10 U/L — ABNORMAL LOW (ref 15–41)
Albumin: 2.6 g/dL — ABNORMAL LOW (ref 3.5–5.0)
Alkaline Phosphatase: 39 U/L (ref 38–126)
Anion gap: 7 (ref 5–15)
BUN: <5 mg/dL — ABNORMAL LOW (ref 6–20)
CO2: 24 mmol/L (ref 22–32)
Calcium: 8.7 mg/dL — ABNORMAL LOW (ref 8.9–10.3)
Chloride: 106 mmol/L (ref 101–111)
Creatinine, Ser: 0.79 mg/dL (ref 0.61–1.24)
GFR calc Af Amer: >60 mL/min (ref >60)
GFR calc non Af Amer: >60 mL/min (ref >60)
Glucose, Bld: 84 mg/dL (ref 65–99)
Potassium: 3.5 mmol/L (ref 3.5–5.1)
Sodium: 137 mmol/L (ref 135–145)
Total Bilirubin: 0.3 mg/dL (ref 0.3–1.2)
Total Protein: 6.3 g/dL — ABNORMAL LOW (ref 6.5–8.1)

## 2016-04-18 LAB — CBC
HCT: 32.1 % — ABNORMAL LOW (ref 39.0–52.0)
Hemoglobin: 10.7 g/dL — ABNORMAL LOW (ref 13.0–17.0)
MCH: 28.8 pg (ref 26.0–34.0)
MCHC: 33.3 g/dL (ref 30.0–36.0)
MCV: 86.3 fL (ref 78.0–100.0)
Platelets: 309 10*3/uL (ref 150–400)
RBC: 3.72 MIL/uL — ABNORMAL LOW (ref 4.22–5.81)
RDW: 14.5 % (ref 11.5–15.5)
WBC: 8.8 10*3/uL (ref 4.0–10.5)

## 2016-04-18 SURGERY — ESOPHAGOGASTRODUODENOSCOPY (EGD) WITH PROPOFOL
Anesthesia: Monitor Anesthesia Care

## 2016-04-18 MED ORDER — LACTATED RINGERS IV SOLN
INTRAVENOUS | Status: DC
  Administered 2016-04-18: 1000 mL via INTRAVENOUS
  Administered 2016-04-18: 10:00:00 via INTRAVENOUS

## 2016-04-18 MED ORDER — FENTANYL CITRATE (PF) 100 MCG/2ML IJ SOLN
25.0000 ug | INTRAMUSCULAR | Status: DC | PRN
  Administered 2016-04-18: 25 ug via INTRAVENOUS

## 2016-04-18 MED ORDER — PROPOFOL 10 MG/ML IV BOLUS
INTRAVENOUS | Status: AC
  Filled 2016-04-18: qty 20

## 2016-04-18 MED ORDER — VANCOMYCIN 50 MG/ML ORAL SOLUTION: 125.0000 mg | Freq: Four times a day (QID) | ORAL | Status: DC

## 2016-04-18 MED ORDER — STERILE WATER FOR IRRIGATION IR SOLN
Status: DC | PRN
  Administered 2016-04-18: 2.5 mL

## 2016-04-18 MED ORDER — MIDAZOLAM HCL 2 MG/2ML IJ SOLN
1.0000 mg | INTRAMUSCULAR | Status: DC | PRN
  Administered 2016-04-18: 2 mg via INTRAVENOUS

## 2016-04-18 MED ORDER — LIDOCAINE HCL (CARDIAC) 20 MG/ML IV SOLN
INTRAVENOUS | Status: DC | PRN
  Administered 2016-04-18: 60 mg via INTRATRACHEAL

## 2016-04-18 MED ORDER — MIDAZOLAM HCL 2 MG/2ML IJ SOLN
INTRAMUSCULAR | Status: AC
  Filled 2016-04-18: qty 2

## 2016-04-18 MED ORDER — FENTANYL CITRATE (PF) 100 MCG/2ML IJ SOLN
INTRAMUSCULAR | Status: AC
  Filled 2016-04-18: qty 2

## 2016-04-18 MED ORDER — LIDOCAINE VISCOUS 2 % MT SOLN
OROMUCOSAL | Status: AC
  Filled 2016-04-18: qty 15

## 2016-04-18 MED ORDER — SODIUM CHLORIDE 0.9 % IV SOLN: INTRAVENOUS | Status: DC

## 2016-04-18 MED ORDER — LIDOCAINE VISCOUS 2 % MT SOLN
5.0000 mL | Freq: Once | OROMUCOSAL | Status: AC
  Administered 2016-04-18: 5 mL via OROMUCOSAL

## 2016-04-18 MED ORDER — INFLIXIMAB 100 MG IV SOLR
5.0000 mg/kg | Freq: Once | INTRAVENOUS | Status: AC
  Administered 2016-04-18: 500 mg via INTRAVENOUS
  Filled 2016-04-18: qty 50

## 2016-04-18 MED ORDER — PROPOFOL 500 MG/50ML IV EMUL
INTRAVENOUS | Status: DC | PRN
  Administered 2016-04-18: 175 ug/kg/min via INTRAVENOUS

## 2016-04-18 NOTE — Op Note (Signed)
Wayne Avila, Wayne Avila                 ACCOUNT NO.:  [id number]  MEDICAL RECORD NO.:  [id number]  LOCATION:  [facility]                          FACILITY:  [facility]  PHYSICIAN:  R. Garfield Cornea, MD Decker:  1990-12-27  DATE OF PROCEDURE:  04/18/2016 DATE OF DISCHARGE:                              OPERATIVE REPORT  EGD and sigmoidoscopy with biopsy  INDICATIONS FOR PROCEDURE:  A 25 year old gentleman with refractory colitis admitted to hospital with hematemesis.  No further evidence of GI bleed overnight since admission.  Hemoglobin has remained stable in the 11 range.  Patient with both a well-established history of colitis as well as recurrent/recalcitrant C. diff infection although the last C. difficile toxin assay came back negative. He is on pulse dosing with  Vancomycin.  EGD now being done. Sigmoidoscopy also being done with biopsies to rule out the possibility of coexisting CMV infection.  This approach has been discussed with the patient and the patient's mother at length.  Risks, benefits, limitations, alternatives, imponderables have been discussed.  PROCEDURE NOTE:  Deep sedation induced by Dr. Patsey Berthold and associates.  INSTRUMENTATION:  Pentax video chip system.  FINDINGS:  EGD:  Examination of the tubular esophagus revealed no mucosal abnormalities.  The EG junction was easily traversed.  Stomach:  Gastric cavity was empty and insufflated well with air. Thorough exam of the gastric mucosa including retroflexion view of the proximal stomach and esophagogastric junction demonstrated only a small hiatal hernia.  Pylorus was patent.  Examination of the bulb second portion revealed no abnormalities.  THERAPEUTIC/DIAGNOSTIC MANEUVERS PERFORMED:  None.  The patient tolerated the procedure well and was prepared for sigmoidoscopy.  Digital rectal exam revealed no abnormalities.  ENDOSCOPIC FINDINGS:  Examination of the rectal mucosa revealed complete loss of normal  vascular pattern.  The patient had diffuse granularity, erosion and scattered 3 mm to 8 mm ulcerations in the "geographic" distribution extending from the distal rectum all the way up to 40 cm. No pseudomembranes present. The scope was not advanced beyond 40 cm because of edema and inflammation.  Multiple biopsies of the left colon were taken for histologic study and no retroflexion was performed or attempted.  A stool sample was submitted for GI pathogen panel for completion sake. The patient tolerated the procedure well and was taken to PACU in stable condition.  Total service time 23 minutes.  EBL minimal  IMPRESSION:  EGD; small hiatal hernia, otherwise, normal examination. Doubt significant, if any, upper GI bleeding.  Flexible Sigmoidoscopy Findings:  Marked active left-sided proctocolitis, inflammation extended to 40 cm.  No rectal sparing.  Status post biopsy as described.  RECOMMENDATIONS: 1. Advance diet. 2. The patient is receive a Remicade infusion today.  He will get that today in the hospital prior to discharge.  He is to keep this     appointment at [facility] later this month.  It is very important for     him to keep this appointment. 3. Continue pulse vancomycin dosing orally for the next 10 days. 4. As long as he does okay later today, he may be discharged from a GI     Standpoint. 5. Follow-up biopsies, GI  pathogen panel.  I have discussed findings and recommendations at length with patient's family and Dr. Wyline Copas.     [name], MD [place] Ore     RMR/MEDQ  D:  04/18/2016  T:  04/18/2016  Job:  [id number]  cc:   Nolene Ebbs, M.D. Fax: [phone]  Barney Drain, M.D. [address] [place] , [place] [address]

## 2016-04-18 NOTE — Progress Notes (Signed)
Nutrition Brief Note  Patient identified on the Malnutrition Screening Tool (MST) Report  Wt Readings from Last 15 Encounters:  04/18/16 215 lb (97.5 kg)  04/16/16 219 lb (99.3 kg)  04/14/16 218 lb (98.9 kg)  04/09/16 218 lb (98.9 kg)  04/09/16 218 lb (98.9 kg)  04/05/16 221 lb 8 oz (100.5 kg)  03/31/16 228 lb (103.4 kg)  03/19/16 237 lb (107.5 kg)  03/17/16 240 lb (108.9 kg)  03/07/16 240 lb 12.8 oz (109.2 kg)  03/01/16 235 lb (106.6 kg)  02/20/16 235 lb 3.2 oz (106.7 kg)  02/18/16 239 lb (108.4 kg)  02/07/16 239 lb (108.4 kg)  02/03/16 232 lb (105.2 kg)    Body mass index is 32.69 kg/m. Patient meets criteria for obese based on current BMI.   Pt was given discharge orders shortly after he was seen by RD.   Documentation of Encounter:   Pt was seen eating his Lunch. He reports no issues tolerating his meals at this time.   Pt had been eating poorly due to UC flare. This apparently was worsened with PO intake. He also had intermittent nausea/vomiting. At home, he took an antacid, Vit D and probiotic.    His UBW is ~ 240 lbs. From documentation, he appears to have lost >20 lbs in the last month. He has had 3 admissions and 4 ED visits in that interval.   Given imminent discharge, No nutrition interventions warranted at this time.  Burtis Junes RD, LDN, CNSC Clinical Nutrition Pager: [phone] 04/18/2016 3:11 PM

## 2016-04-18 NOTE — Transfer of Care (Signed)
Immediate Anesthesia Transfer of Care Note  Patient: Wayne Avila  Procedure(s) Performed: Procedure(s): ESOPHAGOGASTRODUODENOSCOPY (EGD) WITH PROPOFOL (N/A) FLEXIBLE SIGMOIDOSCOPY (N/A)  Patient Location: PACU  Anesthesia Type:MAC  Level of Consciousness: awake, alert , oriented and patient cooperative  Airway & Oxygen Therapy: Patient Spontanous Breathing and Patient connected to nasal cannula oxygen  Post-op Assessment: Report given to RN and Post -op Vital signs reviewed and stable  Post vital signs: Reviewed and stable  Last Vitals:  Vitals:   04/18/16 1050 04/18/16 1130  BP: 127/81 124/75  Pulse:  91  Resp: 20 (!) 23  Temp:  36.8 C    Last Pain:  Vitals:   04/18/16 1031  TempSrc: Oral  PainSc: 0-No pain      Patients Stated Pain Goal: 8 (17/71/16 5790)  Complications: No apparent anesthesia complications

## 2016-04-18 NOTE — Anesthesia Preprocedure Evaluation (Signed)
Anesthesia Evaluation  Patient identified by MRN, date of birth, ID band Patient awake    Reviewed: Allergy & Precautions, NPO status , Patient's Chart, lab work & pertinent test results  History of Anesthesia Complications (+) PONV and history of anesthetic complications  Airway Mallampati: II  TM Distance: >3 FB Neck ROM: Full    Dental  (+) Teeth Intact   Pulmonary asthma , former smoker,    breath sounds clear to auscultation       Cardiovascular negative cardio ROS   Rhythm:Regular Rate:Normal     Neuro/Psych Seizures -,  PSYCHIATRIC DISORDERS (ADHD) Anxiety Depression Bipolar Disorder    GI/Hepatic PUD, GERD  ,  Endo/Other  Morbid obesity  Renal/GU      Musculoskeletal   Abdominal   Peds  Hematology   Anesthesia Other Findings   Reproductive/Obstetrics                             Anesthesia Physical Anesthesia Plan  ASA: III  Anesthesia Plan: MAC   Post-op Pain Management:    Induction: Intravenous  Airway Management Planned: Simple Face Mask  Additional Equipment:   Intra-op Plan:   Post-operative Plan: Extubation in OR  Informed Consent: I have reviewed the patients History and Physical, chart, labs and discussed the procedure including the risks, benefits and alternatives for the proposed anesthesia with the patient or authorized representative who has indicated his/her understanding and acceptance.     Plan Discussed with:   Anesthesia Plan Comments:         Anesthesia Quick Evaluation

## 2016-04-18 NOTE — Telephone Encounter (Signed)
Pt is inpatient and was done today.

## 2016-04-18 NOTE — Progress Notes (Signed)
Pt has orders for discharge. Discharge paperwork and prescriptions given to mother. IV removed. Pt was not on telemetry. No further questions or concerns at this time. Pt declines wheelchair ride to transport, requesting to walk out with mother.   Celestia Khat, RN

## 2016-04-18 NOTE — Discharge Summary (Signed)
Physician Discharge Summary  Wayne Avila GNO:[id number] DOB: [name] 04, 1990 DOA: 04/17/2016  PCP: [name], MD  Admit date: 04/17/2016 Discharge date: 04/18/2016  Admitted From: Home Disposition:  Home  Recommendations for Outpatient Follow-up:  1. Follow up with PCP in 1-2 weeks 2. Follow up with [facility] as scheduled  Discharge Condition:Stable CODE STATUS:Full Diet recommendation: Heart healthy   Brief/Interim Summary: 25 y.o. male with medical history significant of Ulcerative colitis followed by Dr. [name], depression, GERD, ADHD, chronic abd pain on narcotics, seizures on seizure meds who presents with worsening abd pain with watery diarrhea that started on the morning of hospital admission upon awakening. Symptoms did not improve. Pt was unable to tolerate PO intake. Pain described as primarily in the mid-lower quadrants, nonradiating. Per chart review, patient was seen in GI clinic on 04/16/2016 where patient reportedly had been using Imodium on a daily basis with up to 10 stools per day, keeping patient up at night. Patient has also been using multiple doses of Zofran daily during a visit, patient reported his abdominal pain persists, however is improved from 1 week prior. On 04/17/2016, patient presented back to clinic with complaints of worsening diarrhea and pain per above. Of note, patient had recently completed a course of antibiotics for C. difficile. Patient was planned for Remicade infusion 04/17/2016 per GI recommendations, however patient's family member did not agree with this and Remicade was reportedly not given. Patient was subsequently referred to the emergency department where it was then decided patient is not to receive Remicade with GI recommendations for inpatient admission for flexible sigmoidoscopy with biopsies to evaluate for CMV and/or EGD to assess hematemesis anticipated on 04/18/2016. GI also recommended continued pulse vancomycin regimen.   1. Suspected  acute ulcerative colitis flare 1. Present symptoms worrisome for acute colitis flare 2. Patient is well-known to the gastroenterology service, formally consulted 3. Patient 1 EGD on 04/18/2016 with findings of small hiatal hernia. Flexible sigmoidoscopy did demonstrate marked active left-sided proctocolitis, inflammation extending to 40 cm. No rectal sparing. 4. GI recommendation for infusing Remicade. Afterwards, patient cleared for discharge per GI standpoint with close outpatient follow-up with [facility] later this month. 2. Bipolar 1 disorder 1. Appears stable at this time 2. Continued home regimen as tolerated 3. Hypovolemia/dehydration 1. Continued basal IV fluids as tolerated 2. Labs reviewed. Renal function remained normal 4. Abdominal pain 1. Likely secondary to above 2. Patient was continued on analgesics 5. Nausea/vomiting/diarrhea 1. Likely secondary to acute colitis 2. Plan per above  Discharge Diagnoses:  Principal Problem:   Ulcerative colitis ([place]) Active Problems:   Bipolar I disorder, most recent episode depressed ([name])   Hypovolemia   Dehydration   Generalized abdominal pain   Nausea vomiting and diarrhea    Discharge Instructions     Medication List    TAKE these medications   albuterol (2.5 MG/3ML) 0.083% nebulizer solution Commonly known as:  PROVENTIL Inhale 2.5 mg into the lungs every 6 (six) hours as needed for wheezing or shortness of breath.   albuterol 108 (90 Base) MCG/ACT inhaler Commonly known as:  PROVENTIL HFA;VENTOLIN HFA Inhale 2 puffs into the lungs every 4 (four) hours as needed for wheezing or shortness of breath (and/or cough).   atomoxetine 25 MG capsule Commonly known as:  STRATTERA Take 25 mg by mouth daily. Pt takes with a 1104m capsule.   atomoxetine 100 MG capsule Commonly known as:  STRATTERA Take 100 mg by mouth daily. Pt takes with a 275mcapsule.  budesonide-formoterol 160-4.5 MCG/ACT inhaler Commonly known as:   SYMBICORT Inhale 2 puffs into the lungs 2 (two) times daily.   calcium carbonate 500 MG chewable tablet Commonly known as:  TUMS Chew 1 tablet (200 mg of elemental calcium total) by mouth 3 (three) times daily.   cholecalciferol 1000 units tablet Commonly known as:  VITAMIN D Take 1 tablet (1,000 Units total) by mouth daily. START AFTER VITAMIN D 50,000 U TABLETS ARE COMPLETE   diphenoxylate-atropine 2.5-0.025 MG tablet Commonly known as:  LOMOTIL 1 PO BID TO REDUCE LOOSE STOOLS   divalproex 500 MG DR tablet Commonly known as:  DEPAKOTE Take 1 tablet (500 mg total) by mouth 2 (two) times daily.   EPIPEN 2-PAK 0.3 mg/0.3 mL Soaj injection Generic drug:  EPINEPHrine Inject 0.3 mg into the muscle once as needed (for severe allergic reaction).   HYDROcodone-acetaminophen 5-325 MG tablet Commonly known as:  NORCO/VICODIN Take 1-2 tablets by mouth every 4 (four) hours as needed.   loratadine 10 MG tablet Commonly known as:  CLARITIN Take 10 mg by mouth at bedtime.   montelukast 10 MG tablet Commonly known as:  SINGULAIR Take 1 tablet (10 mg total) by mouth at bedtime.   ondansetron 4 MG tablet Commonly known as:  ZOFRAN 1 PO Q4-6H PRN NAUSEA/VOMTING   ondansetron 8 MG disintegrating tablet Commonly known as:  ZOFRAN ODT Take 1 tablet (8 mg total) by mouth every 8 (eight) hours as needed for nausea or vomiting.   promethazine 12.5 MG tablet Commonly known as:  PHENERGAN 1 PO BID. MAY USE ADDITIONAL DOSE EVERY 4 HOURS. NO MORE THAN 8 DOSES A DAY.   saccharomyces boulardii 250 MG capsule Commonly known as:  FLORASTOR Take 1 capsule (250 mg total) by mouth daily at 2 PM.   sertraline 100 MG tablet Commonly known as:  ZOLOFT Take 1 tablet (100 mg total) by mouth daily.   traZODone 100 MG tablet Commonly known as:  DESYREL Take 100 mg by mouth at bedtime.   vancomycin 50 mg/mL oral solution Commonly known as:  VANCOCIN Take 125 mg po every 3 DAYS for 10 DOSES. What  changed:  Another medication with the same name was added. Make sure you understand how and when to take each.   vancomycin 50 mg/mL oral solution Commonly known as:  VANCOCIN Take 2.5 mLs (125 mg total) by mouth every 6 (six) hours. What changed:  You were already taking a medication with the same name, and this prescription was added. Make sure you understand how and when to take each.   Vitamin D (Ergocalciferol) 50000 units Caps capsule Commonly known as:  DRISDOL Take 1 capsule (50,000 Units total) by mouth every 7 (seven) days.      Follow-up Information    [name], MD. Schedule an appointment as soon as possible for a visit in 2 week(s).   Specialty:  Internal Medicine Contact information: [place] [address] [phone]          Allergies  Allergen Reactions  . Amoxicillin-Pot Clavulanate Nausea And Vomiting and Other (See Comments)    Has patient had a PCN reaction causing immediate rash, facial/tongue/throat swelling, SOB or lightheadedness with hypotension: No Has patient had a PCN reaction causing severe rash involving mucus membranes or skin necrosis: No Has patient had a PCN reaction that required hospitalization No Has patient had a PCN reaction occurring within the last 10 years: No If all of the above answers are "NO", then  may proceed with Cephalosporin use.  . Ibuprofen Other (See Comments)    Per pts MD he is unable to take this medication.   . Omeprazole Nausea And Vomiting  . Pineapple Swelling and Other (See Comments)    Reaction:  Lip swelling  . Strawberry Extract Swelling and Other (See Comments)    Reaction:  Lip swelling  . Tomato Rash    Consultations:  Gastroenterology  Procedures/Studies: Dg Abd Acute W/chest  Result Date: 04/17/2016 CLINICAL DATA:  Nausea.  Vomiting.  Diarrhea . EXAM: DG ABDOMEN ACUTE W/ 1V CHEST COMPARISON:  03/01/2016 . FINDINGS: No acute cardiopulmonary disease. Soft tissue structures  the abdomen are unremarkable. Several air-filled loops of slightly distended small bowel noted. Colonic gas pattern normal. Stool in the colon. Developing mild adynamic ileus may be present. Follow-up abdominal series can be obtained to demonstrate resolution. IMPRESSION: 1. No acute cardiopulmonary disease. 2. Mild adynamic ileus cannot be excluded. Follow-up abdominal series can be obtained to demonstrate resolution. Electronically Signed   By: Marcello Moores  Register   On: 04/17/2016 14:54    Subjective: No complaints at present  Discharge Exam: Vitals:   04/18/16 1050 04/18/16 1130  BP: 127/81 124/75  Pulse:  91  Resp: 20 (!) 23  Temp:  98.3 F (36.8 C)   Vitals:   04/18/16 1040 04/18/16 1045 04/18/16 1050 04/18/16 1130  BP: 125/68 118/70 127/81 124/75  Pulse:    91  Resp: 20 20 20  (!) 23  Temp:    98.3 F (36.8 C)  TempSrc:      SpO2: 98% 100% 95%   Weight:      Height:        General: Pt is alert, awake, not in acute distress Cardiovascular: RRR, S1/S2 +, no rubs, no gallops Respiratory: CTA bilaterally, no wheezing, no rhonchi Abdominal: Soft, NT, ND, bowel sounds + Extremities: no edema, no cyanosis   The results of significant diagnostics from this hospitalization (including imaging, microbiology, ancillary and laboratory) are listed below for reference.     Microbiology: Recent Results (from the past 240 hour(s))  C difficile quick scan w PCR reflex     Status: None   Collection Time: 04/09/16  3:19 PM  Result Value Ref Range Status   C Diff antigen NEGATIVE NEGATIVE Final   C Diff toxin NEGATIVE NEGATIVE Final   C Diff interpretation No C. difficile detected.  Final  Gastrointestinal Panel by PCR , Stool     Status: None   Collection Time: 04/09/16  3:20 PM  Result Value Ref Range Status   Campylobacter species NOT DETECTED NOT DETECTED Final   Plesimonas shigelloides NOT DETECTED NOT DETECTED Final   Salmonella species NOT DETECTED NOT DETECTED Final    Yersinia enterocolitica NOT DETECTED NOT DETECTED Final   Vibrio species NOT DETECTED NOT DETECTED Final   Vibrio cholerae NOT DETECTED NOT DETECTED Final   Enteroaggregative E coli (EAEC) NOT DETECTED NOT DETECTED Final   Enteropathogenic E coli (EPEC) NOT DETECTED NOT DETECTED Final   Enterotoxigenic E coli (ETEC) NOT DETECTED NOT DETECTED Final   Shiga like toxin producing E coli (STEC) NOT DETECTED NOT DETECTED Final   Shigella/Enteroinvasive E coli (EIEC) NOT DETECTED NOT DETECTED Final   Cryptosporidium NOT DETECTED NOT DETECTED Final   Cyclospora cayetanensis NOT DETECTED NOT DETECTED Final   Entamoeba histolytica NOT DETECTED NOT DETECTED Final   Giardia lamblia NOT DETECTED NOT DETECTED Final   Adenovirus F40/41 NOT DETECTED NOT DETECTED Final   Astrovirus  NOT DETECTED NOT DETECTED Final   Norovirus GI/GII NOT DETECTED NOT DETECTED Final   Rotavirus A NOT DETECTED NOT DETECTED Final   Sapovirus (I, II, IV, and V) NOT DETECTED NOT DETECTED Final  C difficile quick scan w PCR reflex     Status: None   Collection Time: 04/14/16  1:04 PM  Result Value Ref Range Status   C Diff antigen NEGATIVE NEGATIVE Final   C Diff toxin NEGATIVE NEGATIVE Final   C Diff interpretation No C. difficile detected.  Final     Labs: BNP (last 3 results) No results for input(s): BNP in the last 8760 hours. Basic Metabolic Panel:  Recent Labs Lab 04/14/16 0949 04/17/16 1309 04/18/16 0635  NA 136 136 137  K 3.4* 3.5 3.5  CL 103 105 106  CO2 25 25 24   GLUCOSE 109* 105* 84  BUN 5* <5* <5*  CREATININE 0.81 0.84 0.79  CALCIUM 8.8* 8.8* 8.7*   Liver Function Tests:  Recent Labs Lab 04/14/16 0949 04/17/16 1309 04/18/16 0635  AST 13* 16 10*  ALT 10* 8* 9*  ALKPHOS 45 43 39  BILITOT 0.3 0.3 0.3  PROT 6.5 6.7 6.3*  ALBUMIN 2.8* 2.9* 2.6*    Recent Labs Lab 04/14/16 0949 04/17/16 1309  LIPASE 16 22   No results for input(s): AMMONIA in the last 168 hours. CBC:  Recent Labs Lab  04/14/16 0949 04/17/16 1309 04/18/16 0635  WBC 10.6* 8.2 8.8  HGB 11.4* 11.4* 10.7*  HCT 33.4* 34.5* 32.1*  MCV 84.6 85.4 86.3  PLT 316 341 309   Cardiac Enzymes: No results for input(s): CKTOTAL, CKMB, CKMBINDEX, TROPONINI in the last 168 hours. BNP: Invalid input(s): POCBNP CBG: No results for input(s): GLUCAP in the last 168 hours. D-Dimer No results for input(s): DDIMER in the last 72 hours. Hgb A1c No results for input(s): HGBA1C in the last 72 hours. Lipid Profile No results for input(s): CHOL, HDL, LDLCALC, TRIG, CHOLHDL, LDLDIRECT in the last 72 hours. Thyroid function studies No results for input(s): TSH, T4TOTAL, T3FREE, THYROIDAB in the last 72 hours.  Invalid input(s): FREET3 Anemia work up No results for input(s): VITAMINB12, FOLATE, FERRITIN, TIBC, IRON, RETICCTPCT in the last 72 hours. Urinalysis    Component Value Date/Time   COLORURINE YELLOW 04/17/2016 1700   APPEARANCEUR CLEAR 04/17/2016 1700   LABSPEC 1.025 04/17/2016 1700   PHURINE 6.0 04/17/2016 1700   GLUCOSEU NEGATIVE 04/17/2016 1700   HGBUR NEGATIVE 04/17/2016 1700   BILIRUBINUR NEGATIVE 04/17/2016 1700   KETONESUR NEGATIVE 04/17/2016 1700   PROTEINUR NEGATIVE 04/17/2016 1700   UROBILINOGEN 0.2 09/04/2014 2355   NITRITE NEGATIVE 04/17/2016 1700   LEUKOCYTESUR NEGATIVE 04/17/2016 1700   Sepsis Labs Invalid input(s): PROCALCITONIN,  WBC,  LACTICIDVEN Microbiology Recent Results (from the past 240 hour(s))  C difficile quick scan w PCR reflex     Status: None   Collection Time: 04/09/16  3:19 PM  Result Value Ref Range Status   C Diff antigen NEGATIVE NEGATIVE Final   C Diff toxin NEGATIVE NEGATIVE Final   C Diff interpretation No C. difficile detected.  Final  Gastrointestinal Panel by PCR , Stool     Status: None   Collection Time: 04/09/16  3:20 PM  Result Value Ref Range Status   Campylobacter species NOT DETECTED NOT DETECTED Final   Plesimonas shigelloides NOT DETECTED NOT DETECTED  Final   Salmonella species NOT DETECTED NOT DETECTED Final   Yersinia enterocolitica NOT DETECTED NOT DETECTED Final  Vibrio species NOT DETECTED NOT DETECTED Final   Vibrio cholerae NOT DETECTED NOT DETECTED Final   Enteroaggregative E coli (EAEC) NOT DETECTED NOT DETECTED Final   Enteropathogenic E coli (EPEC) NOT DETECTED NOT DETECTED Final   Enterotoxigenic E coli (ETEC) NOT DETECTED NOT DETECTED Final   Shiga like toxin producing E coli (STEC) NOT DETECTED NOT DETECTED Final   Shigella/Enteroinvasive E coli (EIEC) NOT DETECTED NOT DETECTED Final   Cryptosporidium NOT DETECTED NOT DETECTED Final   Cyclospora cayetanensis NOT DETECTED NOT DETECTED Final   Entamoeba histolytica NOT DETECTED NOT DETECTED Final   Giardia lamblia NOT DETECTED NOT DETECTED Final   Adenovirus F40/41 NOT DETECTED NOT DETECTED Final   Astrovirus NOT DETECTED NOT DETECTED Final   Norovirus GI/GII NOT DETECTED NOT DETECTED Final   Rotavirus A NOT DETECTED NOT DETECTED Final   Sapovirus (I, II, IV, and V) NOT DETECTED NOT DETECTED Final  C difficile quick scan w PCR reflex     Status: None   Collection Time: 04/14/16  1:04 PM  Result Value Ref Range Status   C Diff antigen NEGATIVE NEGATIVE Final   C Diff toxin NEGATIVE NEGATIVE Final   C Diff interpretation No C. difficile detected.  Final     SIGNED:   Donne Hazel, MD  [facility] 04/18/2016, 2:22 PM  If 7PM-7AM, please contact night-coverage [email or web address] Password [email or web address]

## 2016-04-18 NOTE — Anesthesia Postprocedure Evaluation (Signed)
Anesthesia Post Note  Patient: Wayne Avila  Procedure(s) Performed: Procedure(s) (LRB): ESOPHAGOGASTRODUODENOSCOPY (EGD) WITH PROPOFOL (N/A) FLEXIBLE SIGMOIDOSCOPY (N/A)  Patient location during evaluation: PACU Anesthesia Type: MAC Level of consciousness: awake, awake and alert, oriented and patient cooperative Pain management: pain level controlled Vital Signs Assessment: post-procedure vital signs reviewed and stable Respiratory status: spontaneous breathing Cardiovascular status: blood pressure returned to baseline and stable Postop Assessment: no headache, no backache, adequate PO intake and no signs of nausea or vomiting    Last Vitals:  Vitals:   04/18/16 1050 04/18/16 1130  BP: 127/81 124/75  Pulse:  91  Resp: 20 (!) 23  Temp:  36.8 C    Last Pain:  Vitals:   04/18/16 1130  TempSrc:   PainSc: (P) 0-No pain                 Marvelle Span

## 2016-04-19 LAB — GASTROINTESTINAL PANEL BY PCR, STOOL (REPLACES STOOL CULTURE)

## 2016-04-20 ENCOUNTER — Encounter: Payer: Self-pay | Admitting: Gastroenterology

## 2016-04-20 ENCOUNTER — Telehealth: Payer: Self-pay | Admitting: Gastroenterology

## 2016-04-20 DIAGNOSIS — K51011 Ulcerative (chronic) pancolitis with rectal bleeding: Secondary | ICD-10-CM

## 2016-04-20 NOTE — Telephone Encounter (Signed)
PLEASE CALL PT'S MOTHER. WITHIN THE NEXT 3 DAYS HE SHOULD COMPLETE BLOOD TO CHECK FOR ANTIBODIES AGANST Remicade. If they are negative he will likely need to add an additionaL meds with his REMICADE. THE TEST NEEDS TO DRAWN AT [facility]-TEST: [id number] CPT: 54492; 01007, AND HE NEEDS A TPMT PHENOTYPE DRAWN-TEST: [id number] , CPT: 88325, DX: ULCERATIVE COLITIS.

## 2016-04-21 ENCOUNTER — Other Ambulatory Visit: Payer: Self-pay

## 2016-04-21 NOTE — Telephone Encounter (Signed)
I have tried to order the [phone] test for [facility] and had to call for help. I was told by Juliann Pulse that it is an orderable test, but it is not loaded in our eppic. She will send her IT info and she will email the info to me. Kathy's direct number is [phone].

## 2016-04-22 ENCOUNTER — Encounter: Payer: Self-pay | Admitting: Internal Medicine

## 2016-04-22 NOTE — Addendum Note (Signed)
Addendum  created 04/22/16 0738 by Vista Deck, CRNA   Anesthesia Intra Flowsheets edited

## 2016-04-22 NOTE — Progress Notes (Signed)
PT's mom is aware.

## 2016-04-22 NOTE — Progress Notes (Signed)
cc'ed to pcp °

## 2016-04-22 NOTE — Telephone Encounter (Signed)
LMOM for Wayne Avila to call, I have not received an email yet.

## 2016-04-22 NOTE — Telephone Encounter (Signed)
Pt's mom is also aware that Dr. [name] wants these tests done and we are working on the correct code. Hopefully will have it finished by tomorrow.

## 2016-04-23 ENCOUNTER — Other Ambulatory Visit: Payer: Self-pay

## 2016-04-23 DIAGNOSIS — K529 Noninfective gastroenteritis and colitis, unspecified: Secondary | ICD-10-CM

## 2016-04-23 DIAGNOSIS — K51819 Other ulcerative colitis with unspecified complications: Secondary | ICD-10-CM

## 2016-04-23 NOTE — Telephone Encounter (Signed)
REVIEWED-NO ADDITIONAL RECOMMENDATIONS. 

## 2016-04-23 NOTE — Telephone Encounter (Signed)
I have talked to IT and got the order I need. Also [facility] order ready. Pt's mom notified that she will need to pick up the kit here for the [facility] and all lab orders at front with the kit.

## 2016-04-23 NOTE — Telephone Encounter (Signed)
I called Wayne Avila and she said she has just gotten the link and will email it to me.

## 2016-04-24 ENCOUNTER — Encounter (HOSPITAL_COMMUNITY): Payer: Self-pay

## 2016-04-24 ENCOUNTER — Encounter (HOSPITAL_COMMUNITY): Payer: Medicaid Other

## 2016-04-25 ENCOUNTER — Encounter (HOSPITAL_COMMUNITY): Payer: Self-pay | Admitting: Internal Medicine

## 2016-04-26 LAB — INFLIXIMAB (IFX) CONC+ IFX AB
Anti-Infliximab Antibody: <22 ng/mL
Infliximab Drug Level: 37 ug/mL

## 2016-04-29 ENCOUNTER — Telehealth: Payer: Self-pay

## 2016-04-29 NOTE — Telephone Encounter (Signed)
T/C from [facility] @ [facility] saying the lab drew the blood wrong, drew serum and they needed whole blood.  Her call back number is [phone].

## 2016-05-05 NOTE — Telephone Encounter (Signed)
LAB DRAWN INCORRECTLY.

## 2016-05-05 NOTE — Telephone Encounter (Signed)
Pt's mom is aware and will come by to pick up the kit and order.

## 2016-05-05 NOTE — Telephone Encounter (Signed)
PLEASE CALL PT. HAVE HIM GO BACK TO LAB FOR CORRECT BLOOD DRAW, CPT: [id number].

## 2016-05-05 NOTE — Progress Notes (Signed)
NO ANTIBODIES TO REMICADE.

## 2016-05-18 ENCOUNTER — Encounter (HOSPITAL_COMMUNITY): Payer: Self-pay | Admitting: *Deleted

## 2016-05-18 ENCOUNTER — Emergency Department (HOSPITAL_COMMUNITY)
Admission: EM | Admit: 2016-05-18 | Discharge: 2016-05-19 | Disposition: A | Discharge status: Home / Self Care | Payer: Medicaid Other | Attending: Emergency Medicine | Admitting: Emergency Medicine

## 2016-05-18 DIAGNOSIS — Z87891 Personal history of nicotine dependence: Secondary | ICD-10-CM | POA: Insufficient documentation

## 2016-05-18 DIAGNOSIS — I10 Essential (primary) hypertension: Secondary | ICD-10-CM | POA: Diagnosis not present

## 2016-05-18 DIAGNOSIS — R072 Precordial pain: Secondary | ICD-10-CM | POA: Diagnosis present

## 2016-05-18 DIAGNOSIS — R0789 Other chest pain: Secondary | ICD-10-CM | POA: Diagnosis not present

## 2016-05-18 DIAGNOSIS — F909 Attention-deficit hyperactivity disorder, unspecified type: Secondary | ICD-10-CM | POA: Insufficient documentation

## 2016-05-18 DIAGNOSIS — J45909 Unspecified asthma, uncomplicated: Secondary | ICD-10-CM | POA: Diagnosis not present

## 2016-05-18 DIAGNOSIS — Z79899 Other long term (current) drug therapy: Secondary | ICD-10-CM | POA: Diagnosis not present

## 2016-05-18 NOTE — ED Triage Notes (Signed)
Pt reports having chest pain that started yesterday, says blood pressure is high but not being treated & had a nose bleed an hour ago that lasted 10 minutes.

## 2016-05-18 NOTE — ED Notes (Signed)
Patient reluctant to speak Mother states he has had CP and HTN since last pm.  Pt sees Dr Rowe Pavy in [place] as his physician

## 2016-05-19 ENCOUNTER — Emergency Department (HOSPITAL_COMMUNITY): Payer: Medicaid Other

## 2016-05-19 LAB — CBC WITH DIFFERENTIAL/PLATELET
Basophils Absolute: 0 10*3/uL (ref 0.0–0.1)
Basophils Relative: 0 %
Eosinophils Absolute: 0.3 10*3/uL (ref 0.0–0.7)
Eosinophils Relative: 4 %
HCT: 32.8 % — ABNORMAL LOW (ref 39.0–52.0)
Hemoglobin: 11 g/dL — ABNORMAL LOW (ref 13.0–17.0)
Lymphocytes Relative: 33 %
Lymphs Abs: 1.9 10*3/uL (ref 0.7–4.0)
MCH: 28.7 pg (ref 26.0–34.0)
MCHC: 33.5 g/dL (ref 30.0–36.0)
MCV: 85.6 fL (ref 78.0–100.0)
Monocytes Absolute: 1.2 10*3/uL — ABNORMAL HIGH (ref 0.1–1.0)
Monocytes Relative: 21 %
Neutro Abs: 2.4 10*3/uL (ref 1.7–7.7)
Neutrophils Relative %: 42 %
Platelets: 260 10*3/uL (ref 150–400)
RBC: 3.83 MIL/uL — ABNORMAL LOW (ref 4.22–5.81)
RDW: 12.9 % (ref 11.5–15.5)
WBC: 5.7 10*3/uL (ref 4.0–10.5)

## 2016-05-19 LAB — TROPONIN I: Troponin I: <0.03 ng/mL (ref <0.03)

## 2016-05-19 LAB — COMPREHENSIVE METABOLIC PANEL
ALT: 15 U/L — ABNORMAL LOW (ref 17–63)
AST: 17 U/L (ref 15–41)
Albumin: 3.2 g/dL — ABNORMAL LOW (ref 3.5–5.0)
Alkaline Phosphatase: 46 U/L (ref 38–126)
Anion gap: 6 (ref 5–15)
BUN: 12 mg/dL (ref 6–20)
CO2: 25 mmol/L (ref 22–32)
Calcium: 8.7 mg/dL — ABNORMAL LOW (ref 8.9–10.3)
Chloride: 101 mmol/L (ref 101–111)
Creatinine, Ser: 0.79 mg/dL (ref 0.61–1.24)
GFR calc Af Amer: >60 mL/min (ref >60)
GFR calc non Af Amer: >60 mL/min (ref >60)
Glucose, Bld: 85 mg/dL (ref 65–99)
Potassium: 3.5 mmol/L (ref 3.5–5.1)
Sodium: 132 mmol/L — ABNORMAL LOW (ref 135–145)
Total Bilirubin: 0.3 mg/dL (ref 0.3–1.2)
Total Protein: 6.6 g/dL (ref 6.5–8.1)

## 2016-05-19 NOTE — Discharge Instructions (Signed)
Avoid salt, increase exercise.   See your Physician for recheck

## 2016-05-19 NOTE — ED Provider Notes (Signed)
[place] DEPT Provider Note   CSN: [id number] Arrival date & time: 05/18/16  2321     History   Chief Complaint Chief Complaint  Patient presents with  . Chest Pain    HPI Wayne Avila is a 25 y.o. male.  The history is provided by the patient. No language interpreter was used.  Chest Pain   This is a new problem. The current episode started yesterday. The problem occurs constantly. The problem has not changed since onset.The pain is associated with rest. The pain is present in the substernal region. The quality of the pain is described as sharp and stabbing. The pain does not radiate. Pertinent negatives include no abdominal pain. He has tried nothing for the symptoms. The treatment provided no relief. Risk factors include male gender.  His family medical history is significant for heart disease.  Mother reports pt blood pressure was elevated today.  Pt reports he had sharp pain on and off since yesterday.  Past Medical History:  Diagnosis Date  . Abdominal pain, chronic, right upper quadrant   . ADHD (attention deficit hyperactivity disorder)   . Anxiety   . Asthma   . Bipolar 1 disorder ([place])   . Colitis   . Depression   . GERD (gastroesophageal reflux disease)   . Hearing loss   . HOH (hard of hearing)   . Mental developmental delay 11/15/2012  . PONV (postoperative nausea and vomiting)   . Seizures [facility])     Patient Active Problem List   Diagnosis Date Noted  . Nausea vomiting and diarrhea 04/17/2016  . Generalized abdominal pain   . Dehydration 04/09/2016  . Abnormal thyroid function test 03/24/2016  . Acute blood loss anemia 03/23/2016  . Colitis 03/22/2016  . C. difficile colitis 03/22/2016  . Hypovolemia 03/22/2016  . Gastrointestinal hemorrhage 03/22/2016  . Bipolar I disorder ([place]) 03/22/2016  . Diarrhea 03/07/2016  . Ulcerative colitis ([place]) 12/29/2015  . CAP (community acquired pneumonia)   . 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 Aspiration pneumonitis  ([place]) 11/21/2015  . Chronic blood loss anemia 11/21/2015  . Bipolar I disorder, most recent episode depressed ([place])   . Bipolar 1 disorder, manic, moderate ([place]) 11/09/2014  . Suicidal ideation   . Major depressive disorder, recurrent episode, moderate ([place])   . Oral thrush 02/23/2014  . Mental developmental delay 11/15/2012    Past Surgical History:  Procedure Laterality Date  . ADENOIDECTOMY    . BIOPSY N/A 12/07/2012   Procedure: GASTRIC BIOPSIES;  Surgeon: [name], MD;  Location: [facility];  Service: Endoscopy;  Laterality: N/A;  . BIOPSY N/A 08/30/2013   Procedure: BIOPSY;  Surgeon: [name], MD;  Location: [facility] ORS;  Service: Endoscopy;  Laterality: N/A;  right colon,transverse colon, left colon, rectal biopsies  . BIOPSY  11/20/2015   Procedure: BIOPSY;  Surgeon: [name], MD;  [facility];  Service: Endoscopy;;  ileal, right colon biopsy, left colon, rectum  . BIOPSY  04/18/2016   Procedure: BIOPSY;  Surgeon: [name], MD;  Location[facility];  Service: Endoscopy;;  left descending colon biopsies  . COLONOSCOPY  11/20/2015   Dr. [name]: Severe erythema, edema, ulcers from the anal verge to 20 cm above the anal verge without mucosal sparing, remaining colon and terminal ileum appeared normal. Biopsies from the rectum revealed fulminant active chronic colitis consistent with IBD. Pathology from terminal ileum revealed intramucosal lymphoid aggregates. Remaining colon random biopsies with inactive chronic colitis  .  COLONOSCOPY WITH PROPOFOL N/A 08/30/2013   YEM:VVKPQA mucosa in the terminal ileum/COLITIS/ MILD PROCTITIS. 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  Marland Kitchen COLONOSCOPY WITH PROPOFOL N/A 11/20/2015   Dr. 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 ESOPHAGOGASTRODUODENOSCOPY  11/20/2015   Dr. [name]:  Normal exam, stomach biopsied and duodenal biopsy.. Benign biopsies.  . ESOPHAGOGASTRODUODENOSCOPY (EGD) WITH PROPOFOL N/A 12/07/2012   SLF:The mucosa of the esophagus appeared normal Non-erosive gastritis (inflammation) was found in the gastric antrum; multiple biopsies The duodenal mucosa showed no abnormalities in the bulb and second portion of the duodenum  . ESOPHAGOGASTRODUODENOSCOPY (EGD) WITH PROPOFOL N/A 11/20/2015   Dr. [name]: normal with normal biopsies, negative H.pylori   . ESOPHAGOGASTRODUODENOSCOPY (EGD) WITH PROPOFOL N/A 04/18/2016   Procedure: ESOPHAGOGASTRODUODENOSCOPY (EGD) WITH PROPOFOL;  Surgeon: [name], MD;  [facility];  Service: Endoscopy;  Laterality: N/A;  . FLEXIBLE SIGMOIDOSCOPY N/A 04/18/2016   Procedure: FLEXIBLE SIGMOIDOSCOPY;  Surgeon: [name], MD;  [facility];  Service: Endoscopy;  Laterality: N/A;  . TONSILLECTOMY         Home Medications    Prior to Admission medications   Medication Sig Start Date End Date Taking? Authorizing Provider  albuterol (PROVENTIL HFA;VENTOLIN HFA) 108 (90 Base) MCG/ACT inhaler Inhale 2 puffs into the lungs every 4 (four) hours as needed for wheezing or shortness of breath (and/or cough).   Yes Historical Provider, MD  albuterol (PROVENTIL) (2.5 MG/3ML) 0.083% nebulizer solution Inhale 2.5 mg into the lungs every 6 (six) hours as needed for wheezing or shortness of breath.    Yes Historical Provider, MD  atomoxetine (STRATTERA) 100 MG capsule Take 100 mg by mouth daily. Pt takes with a 32m capsule.   Yes Historical Provider, MD  atomoxetine (STRATTERA) 25 MG capsule Take 25 mg by mouth daily. Pt takes with a 1019mcapsule.   Yes Historical Provider, MD  budesonide-formoterol (SYMBICORT) 160-4.5 MCG/ACT inhaler Inhale 2 puffs into the lungs 2 (two) times daily.   Yes Historical Provider, MD  calcium carbonate (TUMS) 500 MG chewable tablet Chew 1 tablet (200 mg of elemental calcium total) by  mouth 3 (three) times daily. 04/08/16  Yes SaDanie BinderMD  cholecalciferol (VITAMIN D) 1000 units tablet Take 1 tablet (1,000 Units total) by mouth daily. START AFTER VITAMIN D 50,000 U TABLETS ARE COMPLETE 04/08/16  Yes SaDanie BinderMD  diphenoxylate-atropine (LOMOTIL) 2.5-0.025 MG tablet 1 PO BID TO REDUCE LOOSE STOOLS 04/16/16  Yes SaDanie BinderMD  divalproex (DEPAKOTE) 500 MG DR tablet Take 1 tablet (500 mg total) by mouth 2 (two) times daily. 07/03/15  Yes ViPenni BombardMD  EPINEPHrine (EPIPEN 2-PAK) 0.3 mg/0.3 mL IJ SOAJ injection Inject 0.3 mg into the muscle once as needed (for severe allergic reaction).   Yes Historical Provider, MD  HYDROcodone-acetaminophen (NORCO/VICODIN) 5-325 MG tablet Take 1-2 tablets by mouth every 4 (four) hours as needed. 04/16/16  Yes SaDanie BinderMD  loratadine (CLARITIN) 10 MG tablet Take 10 mg by mouth at bedtime.    Yes Historical Provider, MD  montelukast (SINGULAIR) 10 MG tablet Take 1 tablet (10 mg total) by mouth at bedtime. 11/13/14  Yes LaNiel HummerNP  ondansetron (ZOFRAN ODT) 8 MG disintegrating tablet Take 1 tablet (8 mg total) by mouth every 8 (eight) hours as needed for nausea or vomiting. 12/25/15  Yes Jola Schmidt, MD  ondansetron (ZOFRAN) 4 MG tablet 1 PO Q4-6H PRN NAUSEA/VOMTING 04/16/16  Yes [name], MD  promethazine (PHENERGAN) 12.5 MG tablet 1 PO BID. MAY USE ADDITIONAL DOSE EVERY 4 HOURS. NO MORE THAN 8 DOSES A DAY. 03/18/16  Yes [name], MD  saccharomyces boulardii (FLORASTOR) 250 MG capsule Take 1 capsule (250 mg total) by mouth daily at 2 PM. 03/31/16  Yes Kathie Dike, MD  sertraline (ZOLOFT) 100 MG tablet Take 1 tablet (100 mg total) by mouth daily. 11/13/14  Yes [name], NP  traZODone (DESYREL) 100 MG tablet Take 100 mg by mouth at bedtime.   Yes Historical Provider, MD  vancomycin (VANCOCIN) 50 mg/mL oral solution Take 125 mg po every 3 DAYS for 10 DOSES. 04/16/16  Yes [name], MD  vancomycin  (VANCOCIN) 50 mg/mL oral solution Take 2.5 mLs (125 mg total) by mouth every 6 (six) hours. 04/18/16  Yes Donne Hazel, MD  Vitamin D, Ergocalciferol, (DRISDOL) 50000 units CAPS capsule Take 1 capsule (50,000 Units total) by mouth every 7 (seven) days. 04/08/16  Yes [name], MD    Family History Family History  Problem Relation Age of Onset  . Asthma Mother   . Ulcers Mother   . Bipolar disorder Mother   . ADD / ADHD Father   . Diabetes Maternal Grandmother   . Diabetes Maternal Grandfather   . Colon cancer Neg Hx   . Liver disease Neg Hx     Social History Social History  Substance Use Topics  . Smoking status: Former Smoker    Packs/day: 0.00    Years: 2.00    Quit date: 10/06/2012  . Smokeless tobacco: Never Used     Comment: Never smoked cigarettes  . Alcohol use No     Comment: denies usage     Allergies   Amoxicillin-pot clavulanate; Ibuprofen; Omeprazole; Pineapple; Strawberry extract; and Tomato   Review of Systems Review of Systems  Cardiovascular: Positive for chest pain.  Gastrointestinal: Negative for abdominal pain.  All other systems reviewed and are negative.    Physical Exam Updated Vital Signs BP 131/83   Pulse 102   Temp 98.1 F (36.7 C) (Oral)   Resp 19   Ht 5' 8"  (1.727 m)   Wt 99.8 kg   SpO2 99%   BMI 33.45 kg/m   Physical Exam  Constitutional: He appears well-developed and well-nourished.  HENT:  Head: Normocephalic and atraumatic.  Right Ear: External ear normal.  Left Ear: External ear normal.  Eyes: Conjunctivae are normal.  Neck: Neck supple.  Cardiovascular: Normal rate and regular rhythm.   No murmur heard. Pulmonary/Chest: Effort normal and breath sounds normal. No respiratory distress.  Abdominal: Soft. There is no tenderness.  Musculoskeletal: He exhibits no edema.  Neurological: He is alert.  Skin: Skin is warm and dry.  Psychiatric: He has a normal mood and affect.  Nursing note and vitals  reviewed.    ED Treatments / Results  Labs (all labs ordered are listed, but only abnormal results are displayed) Labs Reviewed  CBC WITH DIFFERENTIAL/PLATELET - Abnormal; Notable for the following:       Result Value   RBC 3.83 (*)    Hemoglobin 11.0 (*)    HCT 32.8 (*)    Monocytes Absolute 1.2 (*)    All other components within normal limits  COMPREHENSIVE METABOLIC PANEL - Abnormal; Notable for the following:    Sodium 132 (*)  Calcium 8.7 (*)    Albumin 3.2 (*)    ALT 15 (*)    All other components within normal limits  TROPONIN I    EKG  EKG Interpretation None       Radiology No results found.  Procedures Procedures (including critical care time)  Medications Ordered in ED Medications - No data to display   Initial Impression / Assessment and Plan / ED Course  I have reviewed the triage vital signs and the nursing notes.  Pertinent labs & imaging results that were available during my care of the patient were reviewed by me and considered in my medical decision making (see chart for details).  Clinical Course       Final Clinical Impressions(s) / ED Diagnoses   Final diagnoses:  Chest wall pain  Hypertension, unspecified type    New Prescriptions New Prescriptions   No medications on file  An After Visit Summary was printed and given to the patient. Follow up with Dr. Jeanie Cooks if symptoms persist    Fransico Meadow, PA-C 05/19/16 [place], DO 05/22/16 2101

## 2016-05-19 NOTE — ED Notes (Signed)
To from radiology

## 2016-05-19 NOTE — ED Notes (Signed)
Awaiting radiology read

## 2016-05-22 ENCOUNTER — Encounter (HOSPITAL_COMMUNITY)
Admission: RE | Admit: 2016-05-22 | Discharge: 2016-05-22 | Disposition: A | Discharge status: Home / Self Care | Payer: Medicaid Other | Source: Ambulatory Visit | Attending: Gastroenterology | Admitting: Gastroenterology

## 2016-05-22 ENCOUNTER — Encounter: Payer: Self-pay | Admitting: Gastroenterology

## 2016-05-22 ENCOUNTER — Ambulatory Visit (INDEPENDENT_AMBULATORY_CARE_PROVIDER_SITE_OTHER): Payer: Medicaid Other | Admitting: Gastroenterology

## 2016-05-22 ENCOUNTER — Encounter (HOSPITAL_COMMUNITY): Payer: Self-pay

## 2016-05-22 DIAGNOSIS — R197 Diarrhea, unspecified: Secondary | ICD-10-CM | POA: Diagnosis present

## 2016-05-22 DIAGNOSIS — K51018 Ulcerative (chronic) pancolitis with other complication: Secondary | ICD-10-CM

## 2016-05-22 DIAGNOSIS — A0472 Enterocolitis due to Clostridium difficile, not specified as recurrent: Secondary | ICD-10-CM

## 2016-05-22 DIAGNOSIS — E861 Hypovolemia: Secondary | ICD-10-CM

## 2016-05-22 LAB — CBC WITH DIFFERENTIAL/PLATELET
Basophils Absolute: 0 10*3/uL (ref 0.0–0.1)
Basophils Relative: 0 %
Eosinophils Absolute: 0.3 10*3/uL (ref 0.0–0.7)
Eosinophils Relative: 3 %
HCT: 37.2 % — ABNORMAL LOW (ref 39.0–52.0)
Hemoglobin: 12.2 g/dL — ABNORMAL LOW (ref 13.0–17.0)
Lymphocytes Relative: 13 %
Lymphs Abs: 1.5 10*3/uL (ref 0.7–4.0)
MCH: 28 pg (ref 26.0–34.0)
MCHC: 32.8 g/dL (ref 30.0–36.0)
MCV: 85.5 fL (ref 78.0–100.0)
Monocytes Absolute: 2.8 10*3/uL — ABNORMAL HIGH (ref 0.1–1.0)
Monocytes Relative: 24 %
Neutro Abs: 6.9 10*3/uL (ref 1.7–7.7)
Neutrophils Relative %: 60 %
Platelets: 319 10*3/uL (ref 150–400)
RBC: 4.35 MIL/uL (ref 4.22–5.81)
RDW: 13 % (ref 11.5–15.5)
WBC: 11.6 10*3/uL — ABNORMAL HIGH (ref 4.0–10.5)

## 2016-05-22 LAB — COMPREHENSIVE METABOLIC PANEL
ALT: 12 U/L — ABNORMAL LOW (ref 17–63)
AST: 16 U/L (ref 15–41)
Albumin: 3.5 g/dL (ref 3.5–5.0)
Alkaline Phosphatase: 56 U/L (ref 38–126)
Anion gap: 9 (ref 5–15)
BUN: 9 mg/dL (ref 6–20)
CO2: 23 mmol/L (ref 22–32)
Calcium: 9.3 mg/dL (ref 8.9–10.3)
Chloride: 104 mmol/L (ref 101–111)
Creatinine, Ser: 0.93 mg/dL (ref 0.61–1.24)
GFR calc Af Amer: >60 mL/min (ref >60)
GFR calc non Af Amer: >60 mL/min (ref >60)
Glucose, Bld: 101 mg/dL — ABNORMAL HIGH (ref 65–99)
Potassium: 4.1 mmol/L (ref 3.5–5.1)
Sodium: 136 mmol/L (ref 135–145)
Total Bilirubin: 0.5 mg/dL (ref 0.3–1.2)
Total Protein: 7.2 g/dL (ref 6.5–8.1)

## 2016-05-22 MED ORDER — HYDROCODONE-ACETAMINOPHEN 5-325 MG PO TABS: 1.0000 | ORAL_TABLET | ORAL | 0 refills | Status: DC | PRN

## 2016-05-22 MED ORDER — PROMETHAZINE HCL 12.5 MG PO TABS: ORAL_TABLET | ORAL | 1 refills | Status: DC

## 2016-05-22 MED ORDER — LACTATED RINGERS IV SOLN
Freq: Once | INTRAVENOUS | Status: AC
  Administered 2016-05-22: 13:00:00 via INTRAVENOUS

## 2016-05-22 MED ORDER — ONDANSETRON HCL 4 MG PO TABS: ORAL_TABLET | ORAL | 1 refills | Status: DC

## 2016-05-22 NOTE — Assessment & Plan Note (Addendum)
SYMPTOMS NOT CONTROLLED AND MAY BE DUE TO A VIRUS, C DIFF COLITIS, LESS LIKELY [facility] FLARE.  GET FLUIDS IN SPECIALTY CLINIC TODAY. WE WILL DRAW HIS PROMETHEUS LABS IN CLINIC TODAY. PT DECLINED ADMISSION AT THIS TIME. DRINK WATER TO KEEP YOUR URINE LIGHT YELLOW. RESCHEDULE REMICADE WHEN YOU GET TO SPECIALITY CLINIC. COMPLETE STOOL STUDY. USE ZOFRAN TID FOR 3 DAYS AND USE PHENERGAN IF NEEDED TO CONTROL NAUSEA AND VOMITING. USE NORCO IF NEEDED TO CONTROL PAIN. KAOPECTATE IF NEEDED TO CONTROL DIARRHEA. IT WILL CAUSE BLACK STOOLS. FOLLOW UP IN 4 MOS.

## 2016-05-22 NOTE — Assessment & Plan Note (Signed)
SYMPTOMS WERE CONTROLLED AND NOW WITH ACUTE ONSET DIARRHEA. LAST REMICADE 30 DAYS AGO AND WAS DOING WELL. DOUBT UC FLARE.  GET FLUIDS IN SPECIALTY CLINIC TODAY. WE WILL DRAW HIS PROMETHEUS LABS IN CLINIC TODAY.  DRINK WATER TO KEEP YOUR URINE LIGHT YELLOW.  WE WILL RESCHEDULE REMICADE WHEN YOU GET TO SPECIALITY CLINIC. CHECK TPMT/CBC IN CLINIC TODAY. COMPLETE STOOL STUDY. USE ZOFRAN TID FOR 3 DAYS AND USE PHENERGAN TWICE DAILY AND  IF NEEDED TO CONTROL NAUSEA AND VOMITING. USE NORCO IF NEEDED TO CONTROL PAIN. USE KAOPECTATE IF NEEDED TO CONTROL DIARRHEA. IT WILL CAUSE BLACK STOOLS.  FOLLOW UP IN 4 MOS.

## 2016-05-22 NOTE — Progress Notes (Signed)
ON RECALL  °

## 2016-05-22 NOTE — Patient Instructions (Addendum)
GET FLUIDS IN SPECIALTY CLINIC TODAY. WE WILL DRAW HIS PROMETHEUS LABS, CBC, & CMP IN CLINIC TODAY.  DRINK WATER TO KEEP YOUR URINE LIGHT YELLOW.  WE WILL RESCHEDULE REMICADE WHEN YOU GET TO SPECIALITY CLINIC.  COMPLETE STOOL STUDY.  USE ZOFRAN TID FOR 3 DAYS AND USE PHENERGAN TWICE DAILY AND  IF NEEDED TO CONTROL NAUSEA AND VOMITING.  USE NORCO IF NEEDED TO CONTROL PAIN.  USE KAOPECTATE IF NEEDED TO CONTROL DIARRHEA. IT WILL CAUSE BLACK STOOLS.  FOLLOW UP IN 4 MOS.

## 2016-05-22 NOTE — Progress Notes (Signed)
cc'ed to pcp °

## 2016-05-22 NOTE — Assessment & Plan Note (Signed)
DIARRHEA RESOLVED. VANC TAPER COMPLETE. NOW ACUTE ONSET DIARRHEA.  COMPLETE STOOL STUDY.

## 2016-05-22 NOTE — Progress Notes (Signed)
Subjective:    Patient ID: Wayne Avila, male    DOB: February 17, 1991, 25 y.o.   MRN: [id number]  [name], MD  HPI Was doing well until couple days ago he had diarrhea (10 a day). UP AT NIGHT FOR PAST 2 NIGHTS WITH BM. NO BLODO IN STOOL. TEMP UP TO 100.39F. VOMITING STARTED THIS AM(2X, NO BLOOD). ABDOMINAL PAIN(HURTS, IN MIDDLE). STOLL HAD GOTTEN BACK TO NORMAL FOR 2-3 WEEKS. NO SICK CONTACT. HAD FLU SHOT. LAST REMICADE: NOV 10. PROMETHEUS LABS NOT DRAWN YET.  PT DENIES FEVER, CHILLS, HEMATOCHEZIA, HEMATEMESIS, nausea, vomiting, melena, diarrhea, CHEST PAIN, SHORTNESS OF BREATH,  CHANGE IN BOWEL IN HABITS, constipation, abdominal pain, problems swallowing, problems with sedation, heartburn or indigestion.  Past Medical History:  Diagnosis Date  . Abdominal pain, chronic, right upper quadrant   . ADHD (attention deficit hyperactivity disorder)   . Anxiety   . Asthma   . Bipolar 1 disorder ([place])   . Colitis   . Depression   . GERD (gastroesophageal reflux disease)   . Hearing loss   . HOH (hard of hearing)   . Mental developmental delay 11/15/2012  . PONV (postoperative nausea and vomiting)   . Seizures ([place])    Past Surgical History:  Procedure Laterality Date  . ADENOIDECTOMY    . BIOPSY N/A 12/07/2012   Procedure: GASTRIC BIOPSIES;  Surgeon: [name], MD;  Location: [facility];  Service: Endoscopy;  Laterality: N/A;  . BIOPSY N/A 08/30/2013   Procedure: BIOPSY;  Surgeon: [name], MD;  Location: [facility];  Service: Endoscopy;  Laterality: N/A;  right colon,transverse colon, left colon, rectal biopsies  . BIOPSY  11/20/2015   Procedure: BIOPSY;  Surgeon: [name], MD;  [facility];  Service: Endoscopy;;  ileal, right colon biopsy, left colon, rectum  . BIOPSY  04/18/2016   Procedure: BIOPSY;  Surgeon: [name], MD;  [facility];  Service: Endoscopy;;  left descending colon biopsies  . COLONOSCOPY  11/20/2015   Dr. [name]: Severe  erythema, edema, ulcers from the anal verge to 20 cm above the anal verge without mucosal sparing, remaining colon and terminal ileum appeared normal. Biopsies from the rectum revealed fulminant active chronic colitis consistent with IBD. Pathology from terminal ileum revealed intramucosal lymphoid aggregates. Remaining colon random biopsies with inactive chronic colitis  . COLONOSCOPY WITH PROPOFOL N/A 08/30/2013   DJS:HFWYOV mucosa in the terminal ileum/COLITIS/ MILD PROCTITIS. 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  Marland Kitchen COLONOSCOPY WITH PROPOFOL N/A 11/20/2015   Dr. 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 ESOPHAGOGASTRODUODENOSCOPY  11/20/2015   Dr. [name]: Normal exam, stomach biopsied and duodenal biopsy.. Benign biopsies.  . ESOPHAGOGASTRODUODENOSCOPY (EGD) WITH PROPOFOL N/A 12/07/2012   SLF:The mucosa of the esophagus appeared normal Non-erosive gastritis (inflammation) was found in the gastric antrum; multiple biopsies The duodenal mucosa showed no abnormalities in the bulb and second portion of the duodenum  . ESOPHAGOGASTRODUODENOSCOPY (EGD) WITH PROPOFOL N/A 11/20/2015   Dr. [name]: normal with normal biopsies, negative H.pylori   . ESOPHAGOGASTRODUODENOSCOPY (EGD) WITH PROPOFOL N/A 04/18/2016   Procedure: ESOPHAGOGASTRODUODENOSCOPY (EGD) WITH PROPOFOL;  Surgeon: [name], MD;  [facility];  Service: Endoscopy;  Laterality: N/A;  . FLEXIBLE SIGMOIDOSCOPY N/A 04/18/2016   Procedure: FLEXIBLE SIGMOIDOSCOPY;  Surgeon: [name], MD;  [facility];  Service:  Endoscopy;  Laterality: N/A;  . TONSILLECTOMY     Allergies  Allergen Reactions  . Amoxicillin-Pot Clavulanate Nausea And Vomiting and Other (See Comments)    Has patient had a PCN reaction causing immediate rash, facial/tongue/throat swelling, SOB or  lightheadedness with hypotension: No Has patient had a PCN reaction causing severe rash involving mucus membranes or skin necrosis: No Has patient had a PCN reaction that required hospitalization No Has patient had a PCN reaction occurring within the last 10 years: No If all of the above answers are "NO", then may proceed with Cephalosporin use.  . Ibuprofen Other (See Comments)    Per pts MD he is unable to take this medication.   . Omeprazole Nausea And Vomiting  . Pineapple Swelling and Other (See Comments)    Reaction:  Lip swelling  . Strawberry Extract Swelling and Other (See Comments)    Reaction:  Lip swelling  . Tomato Rash   Current Outpatient Prescriptions  Medication Sig Dispense Refill  . albuterol (PROVENTIL HFA;VENTOLIN HFA) 108 (90 Base) MCG/ACT inhaler Inhale 2 puffs into the lungs every 4 (four) hours as needed for wheezing or shortness of breath (and/or cough).    Marland Kitchen albuterol (PROVENTIL) (2.5 MG/3ML) 0.083% nebulizer solution Inhale 2.5 mg into the lungs every 6 (six) hours as needed for wheezing or shortness of breath.   11  . atomoxetine (STRATTERA) 100 MG capsule Take 100 mg by mouth daily. Pt takes with a 36m capsule.    .Marland Kitchenatomoxetine (STRATTERA) 25 MG capsule Take 25 mg by mouth daily. Pt takes with a 1020mcapsule.    . budesonide-formoterol (SYMBICORT) 160-4.5 MCG/ACT inhaler Inhale 2 puffs into the lungs 2 (two) times daily.    . calcium carbonate (TUMS) 500 MG chewable tablet Chew 1 tablet (200 mg of elemental calcium total) by mouth 3 (three) times daily. 90 tablet 11  . cholecalciferol (VITAMIN D) 1000 units tablet Take 1 tablet (1,000 Units total) by mouth daily. START AFTER VITAMIN D 50,000 U TABLETS ARE COMPLETE    . diphenoxylate-atropine (LOMOTIL) 2.5-0.025 MG tablet 1 PO BID TO REDUCE LOOSE STOOLS    . divalproex (DEPAKOTE) 500 MG DR tablet Take 1 tablet (500 mg total) by mouth 2 (two) times daily.    . Marland KitchenPINEPHrine (EPIPEN 2-PAK) 0.3 mg/0.3 mL IJ SOAJ  injection Inject 0.3 mg into the muscle once as needed (for severe allergic reaction).    . Marland KitchenYDROcodone-acetaminophen (NORCO/VICODIN) 5-325 MG tablet Take 1-2 tablets by mouth every 4 (four) hours as needed.    . loratadine (CLARITIN) 10 MG tablet Take 10 mg by mouth at bedtime.     . montelukast (SINGULAIR) 10 MG tablet Take 1 tablet (10 mg total) by mouth at bedtime.    .      . ondansetron (ZOFRAN) 4 MG tablet 1 PO Q4-6H PRN NAUSEA/VOMTING LAST DOSE 6AM   .      .      . sertraline (ZOLOFT) 100 MG tablet Take 1 tablet (100 mg total) by mouth daily.    . traZODone  100 MG tablet Take 100 mg by mouth at bedtime.    .      .      . Vitamin D, Ergocalciferol, (DRISDOL) 50000 units CAPS capsule Take 1 capsule (50,000 Units total) by mouth every 7 (seven) days.     Review of Systems PER HPI OTHERWISE ALL SYSTEMS ARE NEGATIVE.    Objective:   Physical Exam  Constitutional: He is oriented  to person, place, and time. He appears well-developed and well-nourished. He appears distressed (MILD).  HENT:  Head: Normocephalic and atraumatic.  Mouth/Throat: Oropharynx is clear and moist. No oropharyngeal exudate.  Eyes: Pupils are equal, round, and reactive to light. No scleral icterus.  Neck: Normal range of motion. Neck supple.  Cardiovascular: Normal rate, regular rhythm and normal heart sounds.   Pulmonary/Chest: Effort normal and breath sounds normal. No respiratory distress.  Abdominal: Soft. Bowel sounds are normal. He exhibits no distension. There is tenderness. There is no rebound and no guarding.  MODERATE TTP IN RUQ/RLQ/LLQ, PERIUMBILICAL REGION AND LUQ.   Musculoskeletal: He exhibits no edema.  Lymphadenopathy:    He has no cervical adenopathy.  Neurological: He is alert and oriented to person, place, and time.  NO  NEW FOCAL DEFICITS  Psychiatric:  FLAT AFFECT, NL MOOD  Vitals reviewed.     Assessment & Plan:

## 2016-05-22 NOTE — Assessment & Plan Note (Signed)
PT WITH HR 152 IN CLINIC, LIKELY DUE TO VOLUME DEPLETION.  GET FLUIDS IN SPECIALTY CLINIC TODAY. WE WILL DRAW HIS PROMETHEUS LABS IN CLINIC TODAY.  DRINK WATER TO KEEP YOUR URINE LIGHT YELLOW. WE WILL RESCHEDULE REMICADE WHEN YOU GET TO SPECIALITY CLINIC. COMPLETE STOOL STUDY AND CMP TODAY. USE ZOFRAN TID FOR 3 DAYS AND USE PHENERGAN TWICE DAILY AND  IF NEEDED TO CONTROL NAUSEA AND VOMITING. USE KAOPECTATE IF NEEDED TO CONTROL DIARRHEA. IT WILL CAUSE BLACK STOOLS.  FOLLOW UP IN 4 MOS.

## 2016-05-23 ENCOUNTER — Emergency Department (HOSPITAL_COMMUNITY)
Admission: EM | Admit: 2016-05-23 | Discharge: 2016-05-23 | Disposition: A | Discharge status: Home / Self Care | Payer: Medicaid Other | Attending: Emergency Medicine | Admitting: Emergency Medicine

## 2016-05-23 ENCOUNTER — Encounter (HOSPITAL_COMMUNITY): Payer: Self-pay | Admitting: Cardiology

## 2016-05-23 DIAGNOSIS — Z87891 Personal history of nicotine dependence: Secondary | ICD-10-CM | POA: Insufficient documentation

## 2016-05-23 DIAGNOSIS — E860 Dehydration: Secondary | ICD-10-CM | POA: Diagnosis not present

## 2016-05-23 DIAGNOSIS — Z79899 Other long term (current) drug therapy: Secondary | ICD-10-CM | POA: Diagnosis not present

## 2016-05-23 DIAGNOSIS — R197 Diarrhea, unspecified: Secondary | ICD-10-CM

## 2016-05-23 DIAGNOSIS — R1084 Generalized abdominal pain: Secondary | ICD-10-CM | POA: Diagnosis not present

## 2016-05-23 DIAGNOSIS — J45909 Unspecified asthma, uncomplicated: Secondary | ICD-10-CM | POA: Diagnosis not present

## 2016-05-23 DIAGNOSIS — F909 Attention-deficit hyperactivity disorder, unspecified type: Secondary | ICD-10-CM | POA: Diagnosis not present

## 2016-05-23 LAB — COMPREHENSIVE METABOLIC PANEL
ALT: 10 U/L — ABNORMAL LOW (ref 17–63)
AST: 11 U/L — ABNORMAL LOW (ref 15–41)
Albumin: 3.1 g/dL — ABNORMAL LOW (ref 3.5–5.0)
Alkaline Phosphatase: 46 U/L (ref 38–126)
Anion gap: 9 (ref 5–15)
BUN: 7 mg/dL (ref 6–20)
CO2: 25 mmol/L (ref 22–32)
Calcium: 8.6 mg/dL — ABNORMAL LOW (ref 8.9–10.3)
Chloride: 101 mmol/L (ref 101–111)
Creatinine, Ser: 0.92 mg/dL (ref 0.61–1.24)
GFR calc Af Amer: >60 mL/min (ref >60)
GFR calc non Af Amer: >60 mL/min (ref >60)
Glucose, Bld: 106 mg/dL — ABNORMAL HIGH (ref 65–99)
Potassium: 3.6 mmol/L (ref 3.5–5.1)
Sodium: 135 mmol/L (ref 135–145)
Total Bilirubin: 0.2 mg/dL — ABNORMAL LOW (ref 0.3–1.2)
Total Protein: 6.7 g/dL (ref 6.5–8.1)

## 2016-05-23 LAB — URINALYSIS, ROUTINE W REFLEX MICROSCOPIC
Bilirubin Urine: NEGATIVE
Glucose, UA: NEGATIVE mg/dL
Hgb urine dipstick: NEGATIVE
Ketones, ur: NEGATIVE mg/dL
Leukocytes, UA: NEGATIVE
Nitrite: NEGATIVE
Protein, ur: NEGATIVE mg/dL
Specific Gravity, Urine: 1.021 (ref 1.005–1.030)
pH: 5 (ref 5.0–8.0)

## 2016-05-23 LAB — CLOSTRIDIUM DIFFICILE BY PCR: Toxigenic C. Difficile by PCR: POSITIVE — AB

## 2016-05-23 LAB — CBC
HCT: 31.4 % — ABNORMAL LOW (ref 39.0–52.0)
Hemoglobin: 10.6 g/dL — ABNORMAL LOW (ref 13.0–17.0)
MCH: 29 pg (ref 26.0–34.0)
MCHC: 33.8 g/dL (ref 30.0–36.0)
MCV: 86 fL (ref 78.0–100.0)
Platelets: 249 10*3/uL (ref 150–400)
RBC: 3.65 MIL/uL — ABNORMAL LOW (ref 4.22–5.81)
RDW: 13.1 % (ref 11.5–15.5)
WBC: 9.5 10*3/uL (ref 4.0–10.5)

## 2016-05-23 LAB — LIPASE, BLOOD: Lipase: 18 U/L (ref 11–51)

## 2016-05-23 LAB — C DIFFICILE QUICK SCREEN W PCR REFLEX
C Diff antigen: POSITIVE — AB
C Diff toxin: NEGATIVE

## 2016-05-23 MED ORDER — SODIUM CHLORIDE 0.9 % IV BOLUS (SEPSIS)
1000.0000 mL | Freq: Once | INTRAVENOUS | Status: AC
  Administered 2016-05-23: 1000 mL via INTRAVENOUS

## 2016-05-23 MED ORDER — MORPHINE SULFATE (PF) 2 MG/ML IV SOLN
2.0000 mg | Freq: Once | INTRAVENOUS | Status: AC
  Administered 2016-05-23: 2 mg via INTRAVENOUS
  Filled 2016-05-23: qty 1

## 2016-05-23 MED ORDER — MORPHINE SULFATE (PF) 4 MG/ML IV SOLN: 4.0000 mg | Freq: Once | INTRAVENOUS | Status: DC

## 2016-05-23 MED ORDER — VANCOMYCIN 50 MG/ML ORAL SOLUTION
250.0000 mg | Freq: Four times a day (QID) | ORAL | 0 refills | Status: DC
Start: 2016-05-23 — End: 2016-05-26

## 2016-05-23 NOTE — ED Triage Notes (Signed)
Diarrhea times 3 days.  Abdominal pain , vomiting and fever since yesterday.   Seen dr. [name] yesterday and had lab work done. Was told his WBC was high.  Had to received IVF yesterday.  Is to have stool checked for c dif.

## 2016-05-23 NOTE — ED Provider Notes (Signed)
[facility] [facility] DEPT Provider Note   CSN: [id number] Arrival date & time: 05/23/16  9935     History   Chief Complaint Chief Complaint  Patient presents with  . Diarrhea    Wayne Avila is a 25 y.o. male.  Wayne  Wayne Avila is a 25 year old male with past medical history of colitis, recurrent Cdiff, GERD, seizures, bipolar I disorder, ADHD presenting with 3 days of diarrhea.   He notes he's had 2 episodes of watery, brown diarrhea today already, mom notes it has been 5. He denies hematochezia or melana. He notes abdominal pain that started yesterday and is in the periumbilical region. Pain is worsened by movement, not by food.  He states pain is stable from yesterday. Notes that he had nausea and vomiting yesterday, but this has resolved.   The patient states that he saw Dr. [name], GI, yesterday and had labwork done. He's presenting today to the ED because even though abdominal pain is stable, he note a fever up to 100.8 this morning.   The patient is on Remicade, his last injection was November 10. Yesterday at his GI appt, the patient was noted to be tachycardic to 152, was given IV fluids, [facility] labs were drawn, and he was advised to complete stool studies in clinic (which he couldn't provide. He was using Zofran and Phenergan as needed. He was prescribed kaopectate as needed for diarrhea, he's only used this once. He last took hydrocodone-acetaminophen this morning around 7am.   Past Medical History:  Diagnosis Date  . Abdominal pain, chronic, right upper quadrant   . ADHD (attention deficit hyperactivity disorder)   . Anxiety   . Asthma   . Bipolar 1 disorder ([place])   . Colitis   . Depression   . GERD (gastroesophageal reflux disease)   . Hearing loss   . HOH (hard of hearing)   . Mental developmental delay 11/15/2012  . PONV (postoperative nausea and vomiting)   . Seizures [facility])     Patient Active Problem List   Diagnosis Date Noted  . Acute diarrhea  05/22/2016  . Abnormal thyroid function test 03/24/2016  . Acute blood loss anemia 03/23/2016  . C. difficile colitis 03/22/2016  . Hypovolemia 03/22/2016  . Bipolar I disorder ([place]) 03/22/2016  . Ulcerative colitis ([place]) 12/29/2015  . CAP (community acquired pneumonia)   . Aspiration pneumonitis ([place]) 11/21/2015  . Bipolar I disorder, most recent episode depressed ([place])   . Bipolar 1 disorder, manic, moderate ([place]) 11/09/2014  . Suicidal ideation   . Major depressive disorder, recurrent episode, moderate ([place])   . Oral thrush 02/23/2014  . Mental developmental delay 11/15/2012    Past Surgical History:  Procedure Laterality Date  . ADENOIDECTOMY    . BIOPSY N/A 12/07/2012   Procedure: GASTRIC BIOPSIES;  Surgeon: [name], MD;  Location: [facility] ORS;  Service: Endoscopy;  Laterality: N/A;  . BIOPSY N/A 08/30/2013   Procedure: BIOPSY;  Surgeon: [name], MD;  Location: [facility];  Service: Endoscopy;  Laterality: N/A;  right colon,transverse colon, left colon, rectal biopsies  . BIOPSY  11/20/2015   Procedure: BIOPSY;  Surgeon: [name], MD;  Location: [facility] SU[facility];  Service: Endoscopy;;  ileal, right colon biopsy, left colon, rectum  . BIOPSY  04/18/2016   Procedure: BIOPSY;  Surgeon: [name], MD;  Location: [facility];  Service: Endoscopy;;  left descending colon biopsies  . COLONOSCOPY  11/20/2015   Dr. [name]: Severe erythema,  edema, ulcers from the anal verge to 20 cm above the anal verge without mucosal sparing, remaining colon and terminal ileum appeared normal. Biopsies from the rectum revealed fulminant active chronic colitis consistent with IBD. Pathology from terminal ileum revealed intramucosal lymphoid aggregates. Remaining colon random biopsies with inactive chronic colitis  . COLONOSCOPY WITH PROPOFOL N/A 08/30/2013   ERX:VQMGQQ mucosa in the terminal ileum/COLITIS/ MILD PROCTITIS. 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  Marland Kitchen COLONOSCOPY WITH PROPOFOL N/A 11/20/2015   Dr. 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 ESOPHAGOGASTRODUODENOSCOPY  11/20/2015   Dr. [name]: Normal exam, stomach biopsied and duodenal biopsy.. Benign biopsies.  . ESOPHAGOGASTRODUODENOSCOPY (EGD) WITH PROPOFOL N/A 12/07/2012   SLF:The mucosa of the esophagus appeared normal Non-erosive gastritis (inflammation) was found in the gastric antrum; multiple biopsies The duodenal mucosa showed no abnormalities in the bulb and second portion of the duodenum  . ESOPHAGOGASTRODUODENOSCOPY (EGD) WITH PROPOFOL N/A 11/20/2015   Dr. [name]: normal with normal biopsies, negative H.pylori   . ESOPHAGOGASTRODUODENOSCOPY (EGD) WITH PROPOFOL N/A 04/18/2016   Procedure: ESOPHAGOGASTRODUODENOSCOPY (EGD) WITH PROPOFOL;  Surgeon: [name], MD;  Location[facility];  Service: Endoscopy;  Laterality: N/A;  . FLEXIBLE SIGMOIDOSCOPY N/A 04/18/2016   Procedure: FLEXIBLE SIGMOIDOSCOPY;  Surgeon: [name], MD;  Location: [facility];  Service: Endoscopy;  Laterality: N/A;  . TONSILLECTOMY         Home Medications    Prior to Admission medications   Medication Sig Start Date End Date Taking? Authorizing Provider  albuterol (PROVENTIL HFA;VENTOLIN HFA) 108 (90 Base) MCG/ACT inhaler Inhale 2 puffs into the lungs every 4 (four) hours as needed for wheezing or shortness of breath (and/or cough).   Yes Historical Provider, MD  albuterol (PROVENTIL) (2.5 MG/3ML) 0.083% nebulizer solution Inhale 2.5 mg into the lungs every 6 (six) hours as needed for wheezing or shortness of breath.    Yes Historical Provider, MD  atomoxetine (STRATTERA) 100 MG capsule Take 100 mg by mouth daily. Pt takes with a 26m capsule.   Yes Historical Provider, MD  calcium carbonate (TUMS) 500 MG chewable tablet Chew 1 tablet (200 mg of elemental calcium total) by  mouth 3 (three) times daily. 04/08/16  Yes SDanie Binder MD  cholecalciferol (VITAMIN D) 1000 units tablet Take 1 tablet (1,000 Units total) by mouth daily. START AFTER VITAMIN D 50,000 U TABLETS ARE COMPLETE 04/08/16  Yes SDanie Binder MD  diphenoxylate-atropine (LOMOTIL) 2.5-0.025 MG tablet 1 PO BID TO REDUCE LOOSE STOOLS 04/16/16  Yes SDanie Binder MD  divalproex (DEPAKOTE) 500 MG DR tablet Take 1 tablet (500 mg total) by mouth 2 (two) times daily. 07/03/15  Yes VPenni Bombard MD  EPINEPHrine (EPIPEN 2-PAK) 0.3 mg/0.3 mL IJ SOAJ injection Inject 0.3 mg into the muscle once as needed (for severe allergic reaction).   Yes Historical Provider, MD  HYDROcodone-acetaminophen (NORCO/VICODIN) 5-325 MG tablet Take 1-2 tablets by mouth every 4 (four) hours as needed. 05/22/16  Yes SDanie Binder MD  loratadine (CLARITIN) 10 MG tablet Take 10 mg by mouth at bedtime.    Yes Historical Provider, MD  montelukast (SINGULAIR) 10 MG tablet Take 1 tablet (10 mg total) by mouth at bedtime. 11/13/14  Yes LNiel Hummer NP  ondansetron (ZOFRAN) 4 MG tablet 1 PO TID FOR 3 DAYS THEN Q4-6H PRN NAUSEA/VOMTING 05/22/16  Yes [name]  Fields, MD  sertraline (ZOLOFT) 100 MG tablet Take 1 tablet (100 mg total) by mouth daily. 11/13/14  Yes [name], NP  traZODone (DESYREL) 100 MG tablet Take 100 mg by mouth at bedtime.   Yes Historical Provider, MD  Vitamin D, Ergocalciferol, (DRISDOL) 50000 units CAPS capsule Take 1 capsule (50,000 Units total) by mouth every 7 (seven) days. 04/08/16  Yes [name], MD  vancomycin (VANCOCIN) 50 mg/mL oral solution Take 5 mLs (250 mg total) by mouth every 6 (six) hours. For 10 days 05/23/16   Archie Patten, MD    Family History Family History  Problem Relation Age of Onset  . Asthma Mother   . Ulcers Mother   . Bipolar disorder Mother   . ADD / ADHD Father   . Diabetes Maternal Grandmother   . Diabetes Maternal Grandfather   . Colon cancer Neg Hx   . Liver disease  Neg Hx     Social History Social History  Substance Use Topics  . Smoking status: Former Smoker    Packs/day: 0.00    Years: 2.00    Quit date: 10/06/2012  . Smokeless tobacco: Never Used     Comment: Never smoked cigarettes  . Alcohol use No     Comment: denies usage     Allergies   Amoxicillin-pot clavulanate; Ibuprofen; Omeprazole; Pineapple; Strawberry extract; and Tomato   Review of Systems Review of Systems  Constitutional: Positive for appetite change and fever. Negative for activity change, chills, fatigue and unexpected weight change.  Gastrointestinal: Positive for abdominal pain, diarrhea, nausea and vomiting. Negative for abdominal distention, anal bleeding, blood in stool and constipation.  Musculoskeletal: Negative for arthralgias, back pain and joint swelling.  Skin: Negative for rash.  All other systems reviewed and are negative.    Physical Exam Updated Vital Signs BP 123/82   Pulse 105   Temp 98.6 F (37 C) (Oral)   Resp 16   Ht 5' 8"  (1.727 m)   Wt 98 kg   SpO2 98%   BMI 32.84 kg/m   Physical Exam  Constitutional: He is oriented to person, place, and time. He appears well-developed and well-nourished. No distress.  Lying supine in bed, non-toxic  HENT:  Head: Normocephalic and atraumatic.  Nose: Nose normal.  Mouth/Throat: Oropharynx is clear and moist. No oropharyngeal exudate.  Eyes: Conjunctivae are normal. Right eye exhibits no discharge. Left eye exhibits no discharge. No scleral icterus.  Neck: Normal range of motion.  Cardiovascular: Regular rhythm, normal heart sounds and intact distal pulses.   No murmur heard. Tachycardic  Pulmonary/Chest: Effort normal and breath sounds normal. No respiratory distress. He has no wheezes. He has no rales. He exhibits no tenderness.  Abdominal: Soft. Bowel sounds are normal. He exhibits no distension. There is tenderness. There is no rebound and no guarding. No hernia.  Mild tenderness diffusely.    Musculoskeletal: He exhibits no edema or tenderness.  Lymphadenopathy:    He has no cervical adenopathy.  Neurological: He is alert and oriented to person, place, and time. He exhibits normal muscle tone.  Skin: Skin is warm. Capillary refill takes less than 2 seconds. No rash noted. He is not diaphoretic. No pallor.  Psychiatric: He has a normal mood and affect. His behavior is normal.     ED Treatments / Results  Labs (all labs ordered are listed, but only abnormal results are displayed) Labs Reviewed  C DIFFICILE QUICK SCREEN W PCR REFLEX - Abnormal; Notable for  the following:       Result Value   C Diff antigen POSITIVE (*)    All other components within normal limits  COMPREHENSIVE METABOLIC PANEL - Abnormal; Notable for the following:    Glucose, Bld 106 (*)    Calcium 8.6 (*)    Albumin 3.1 (*)    AST 11 (*)    ALT 10 (*)    Total Bilirubin 0.2 (*)    All other components within normal limits  CBC - Abnormal; Notable for the following:    RBC 3.65 (*)    Hemoglobin 10.6 (*)    HCT 31.4 (*)    All other components within normal limits  GASTROINTESTINAL PANEL BY PCR, STOOL (REPLACES STOOL CULTURE)  CLOSTRIDIUM DIFFICILE BY PCR  LIPASE, BLOOD  URINALYSIS, ROUTINE W REFLEX MICROSCOPIC    EKG  EKG Interpretation None       Radiology No results found.  Procedures Procedures (including critical care time)  Medications Ordered in ED Medications  sodium chloride 0.9 % bolus 1,000 mL (0 mLs Intravenous Stopped 05/23/16 1226)  morphine 2 MG/ML injection 2 mg (2 mg Intravenous Given 05/23/16 1226)  sodium chloride 0.9 % bolus 1,000 mL (0 mLs Intravenous Stopped 05/23/16 1406)     Initial Impression / Assessment and Plan / ED Course  I have reviewed the triage vital signs and the nursing notes.  Pertinent labs & imaging results that were available during my care of the patient were reviewed by me and considered in my medical decision making (see chart for  details).  Clinical Course    1000: abdominal exam non-surgical, will get GI pathogen panel and repeat cdiff test. CBC, CMET, and lipase pending. Will give IVFs.   1210: patient notes worsening pain. Will give dose of morphine. IVFs in process.   1300: 2nd L of IVFs running. Patient's abdominal pain has improved with morphine. C diff antigen positive, toxin negative. Cdiff PCR pending. Discussed with Dr. Gala Romney on call with GI. Given pt is immunocompromised on Remicaid and has a h/o recurrent Cdiff, will start treatment with PO vancomycin 251m q6hrs x 10 days. Patient advised to f/u with Dr. FOneida Alarnext week. Discussed importance of continued hydration. N/V is currently not an issue, however he has Zofran if needed. Overall, patient is well appearing after 2L NS bolus. HR 105, afebrile, leukocytosis has improved since yesterday (I suspect some component of dehydration contributed to that) abdomen with mild diffuse tenderness but no rebound or guarding. He's sitting up on the side of the bed asking for something to drink. Patient stable for outpatient management. Cdiff PCP pending in addition to GI pathogen panel.    Final Clinical Impressions(s) / ED Diagnoses   Final diagnoses:  Diarrhea, unspecified type  Dehydration    New Prescriptions Discharge Medication List as of 05/23/2016  1:48 PM    START taking these medications   Details  vancomycin (VANCOCIN) 50 mg/mL oral solution Take 5 mLs (250 mg total) by mouth every 6 (six) hours. For 10 days, Starting Fri 05/23/2016, Print         CArchie Patten MD 05/23/16 1[place] MD 05/24/16 2006

## 2016-05-23 NOTE — Discharge Instructions (Signed)
Follow-up with Dr. [name] next week.  Your test results look consistent with C. difficile, however there is one test that is still pending for confirmation.  Speaking with Dr. Artis Flock partner, he suggested starting treatment now with vancomycin.  Start taking vancomycin 254m (552m every 6 hours for the next 10 days.  STOP taking the kaopectate for diarrhea.  Continue to try to stay hydrated with frequent, small amounts of fluid.

## 2016-05-24 ENCOUNTER — Encounter (HOSPITAL_COMMUNITY): Payer: Self-pay | Admitting: Emergency Medicine

## 2016-05-24 ENCOUNTER — Inpatient Hospital Stay (HOSPITAL_COMMUNITY)
Admission: EM | Admit: 2016-05-24 | Discharge: 2016-05-26 | DRG: 372 | Disposition: A | Discharge status: Home / Self Care | Payer: Medicaid Other | Attending: Internal Medicine | Admitting: Internal Medicine

## 2016-05-24 ENCOUNTER — Emergency Department (HOSPITAL_COMMUNITY): Payer: Medicaid Other

## 2016-05-24 DIAGNOSIS — Z886 Allergy status to analgesic agent status: Secondary | ICD-10-CM

## 2016-05-24 DIAGNOSIS — Z91018 Allergy to other foods: Secondary | ICD-10-CM

## 2016-05-24 DIAGNOSIS — F319 Bipolar disorder, unspecified: Secondary | ICD-10-CM | POA: Diagnosis present

## 2016-05-24 DIAGNOSIS — Z818 Family history of other mental and behavioral disorders: Secondary | ICD-10-CM

## 2016-05-24 DIAGNOSIS — K219 Gastro-esophageal reflux disease without esophagitis: Secondary | ICD-10-CM | POA: Diagnosis present

## 2016-05-24 DIAGNOSIS — A09 Infectious gastroenteritis and colitis, unspecified: Secondary | ICD-10-CM

## 2016-05-24 DIAGNOSIS — J45909 Unspecified asthma, uncomplicated: Secondary | ICD-10-CM | POA: Diagnosis present

## 2016-05-24 DIAGNOSIS — H9190 Unspecified hearing loss, unspecified ear: Secondary | ICD-10-CM | POA: Diagnosis present

## 2016-05-24 DIAGNOSIS — G8929 Other chronic pain: Secondary | ICD-10-CM | POA: Diagnosis present

## 2016-05-24 DIAGNOSIS — Z888 Allergy status to other drugs, medicaments and biological substances status: Secondary | ICD-10-CM

## 2016-05-24 DIAGNOSIS — Z23 Encounter for immunization: Secondary | ICD-10-CM

## 2016-05-24 DIAGNOSIS — K5190 Ulcerative colitis, unspecified, without complications: Secondary | ICD-10-CM | POA: Diagnosis present

## 2016-05-24 DIAGNOSIS — F909 Attention-deficit hyperactivity disorder, unspecified type: Secondary | ICD-10-CM | POA: Diagnosis present

## 2016-05-24 DIAGNOSIS — Z825 Family history of asthma and other chronic lower respiratory diseases: Secondary | ICD-10-CM

## 2016-05-24 DIAGNOSIS — R6250 Unspecified lack of expected normal physiological development in childhood: Secondary | ICD-10-CM | POA: Diagnosis present

## 2016-05-24 DIAGNOSIS — Z87891 Personal history of nicotine dependence: Secondary | ICD-10-CM

## 2016-05-24 DIAGNOSIS — K51919 Ulcerative colitis, unspecified with unspecified complications: Secondary | ICD-10-CM | POA: Diagnosis present

## 2016-05-24 DIAGNOSIS — E876 Hypokalemia: Secondary | ICD-10-CM | POA: Diagnosis present

## 2016-05-24 DIAGNOSIS — A0472 Enterocolitis due to Clostridium difficile, not specified as recurrent: Secondary | ICD-10-CM | POA: Diagnosis not present

## 2016-05-24 DIAGNOSIS — F419 Anxiety disorder, unspecified: Secondary | ICD-10-CM | POA: Diagnosis present

## 2016-05-24 DIAGNOSIS — Z833 Family history of diabetes mellitus: Secondary | ICD-10-CM

## 2016-05-24 DIAGNOSIS — Z880 Allergy status to penicillin: Secondary | ICD-10-CM

## 2016-05-24 DIAGNOSIS — Z79899 Other long term (current) drug therapy: Secondary | ICD-10-CM

## 2016-05-24 LAB — LACTIC ACID, PLASMA
Lactic Acid, Venous: 1.3 mmol/L (ref 0.5–1.9)
Lactic Acid, Venous: 0.9 mmol/L (ref 0.5–1.9)

## 2016-05-24 LAB — CBC WITH DIFFERENTIAL/PLATELET
Basophils Absolute: 0 10*3/uL (ref 0.0–0.1)
Basophils Relative: 0 %
Eosinophils Absolute: 0.4 10*3/uL (ref 0.0–0.7)
Eosinophils Relative: 5 %
HCT: 32.3 % — ABNORMAL LOW (ref 39.0–52.0)
Hemoglobin: 10.6 g/dL — ABNORMAL LOW (ref 13.0–17.0)
Lymphocytes Relative: 17 %
Lymphs Abs: 1.3 10*3/uL (ref 0.7–4.0)
MCH: 28.3 pg (ref 26.0–34.0)
MCHC: 32.8 g/dL (ref 30.0–36.0)
MCV: 86.1 fL (ref 78.0–100.0)
Monocytes Absolute: 1.4 10*3/uL — ABNORMAL HIGH (ref 0.1–1.0)
Monocytes Relative: 18 %
Neutro Abs: 4.8 10*3/uL (ref 1.7–7.7)
Neutrophils Relative %: 60 %
Platelets: 297 10*3/uL (ref 150–400)
RBC: 3.75 MIL/uL — ABNORMAL LOW (ref 4.22–5.81)
RDW: 12.9 % (ref 11.5–15.5)
WBC: 7.8 10*3/uL (ref 4.0–10.5)

## 2016-05-24 LAB — LIPASE, BLOOD: Lipase: 13 U/L (ref 11–51)

## 2016-05-24 LAB — GASTROINTESTINAL PANEL BY PCR, STOOL (REPLACES STOOL CULTURE)

## 2016-05-24 LAB — COMPREHENSIVE METABOLIC PANEL
ALT: 9 U/L — ABNORMAL LOW (ref 17–63)
AST: 11 U/L — ABNORMAL LOW (ref 15–41)
Albumin: 3 g/dL — ABNORMAL LOW (ref 3.5–5.0)
Alkaline Phosphatase: 51 U/L (ref 38–126)
Anion gap: 7 (ref 5–15)
BUN: <5 mg/dL — ABNORMAL LOW (ref 6–20)
CO2: 27 mmol/L (ref 22–32)
Calcium: 9 mg/dL (ref 8.9–10.3)
Chloride: 101 mmol/L (ref 101–111)
Creatinine, Ser: 0.85 mg/dL (ref 0.61–1.24)
GFR calc Af Amer: >60 mL/min (ref >60)
GFR calc non Af Amer: >60 mL/min (ref >60)
Glucose, Bld: 90 mg/dL (ref 65–99)
Potassium: 3.8 mmol/L (ref 3.5–5.1)
Sodium: 135 mmol/L (ref 135–145)
Total Bilirubin: 0.3 mg/dL (ref 0.3–1.2)
Total Protein: 6.9 g/dL (ref 6.5–8.1)

## 2016-05-24 MED ORDER — VANCOMYCIN 50 MG/ML ORAL SOLUTION
250.0000 mg | Freq: Once | ORAL | Status: DC
Start: 2016-05-24 — End: 2016-05-25
  Filled 2016-05-24: qty 5

## 2016-05-24 MED ORDER — FENTANYL CITRATE (PF) 100 MCG/2ML IJ SOLN
50.0000 ug | INTRAMUSCULAR | Status: AC | PRN
  Administered 2016-05-24 (×2): 50 ug via INTRAVENOUS
  Filled 2016-05-24 (×2): qty 2

## 2016-05-24 MED ORDER — SODIUM CHLORIDE 0.9 % IV BOLUS (SEPSIS)
1000.0000 mL | Freq: Once | INTRAVENOUS | Status: AC
  Administered 2016-05-24: 1000 mL via INTRAVENOUS

## 2016-05-24 MED ORDER — SODIUM CHLORIDE 0.9 % IV SOLN
INTRAVENOUS | Status: DC
  Administered 2016-05-24 – 2016-05-26 (×5): via INTRAVENOUS

## 2016-05-24 MED ORDER — IOPAMIDOL (ISOVUE-300) INJECTION 61%
100.0000 mL | Freq: Once | INTRAVENOUS | Status: AC | PRN
  Administered 2016-05-24: 100 mL via INTRAVENOUS

## 2016-05-24 MED ORDER — IOPAMIDOL (ISOVUE-300) INJECTION 61%
INTRAVENOUS | Status: AC
  Filled 2016-05-24: qty 30

## 2016-05-24 NOTE — ED Provider Notes (Signed)
[place] DEPT Provider Note   CSN: [id number] Arrival date & time: 05/24/16  1748     History   Chief Complaint Chief Complaint  Patient presents with  . Abdominal Pain    HPI Wayne Avila is a 25 y.o. male.  HPI  Pt was seen at 1830.   Per pt, c/o gradual onset and worsening of constant generalized abd "pain" and diarrhea since yesterday.  Has been associated with decreased PO intake and increased number of episodes of diarrhea for the past 3 days. Denies N/V, no fevers, no back pain, no rash, no CP/SOB, no black or blood in stools. The patient has a significant history of similar symptoms previously, recently being evaluated for this complaint and multiple prior evals for same, with last ED evaluation yesterday. Pt was dx cdiff and rx vancomycin. Pt filled the rx and has been taking it since yesterday.     Past Medical History:  Diagnosis Date  . Abdominal pain, chronic, right upper quadrant   . ADHD (attention deficit hyperactivity disorder)   . Anxiety   . Asthma   . Bipolar 1 disorder ([place])   . Colitis   . Depression   . GERD (gastroesophageal reflux disease)   . Hearing loss   . HOH (hard of hearing)   . Mental developmental delay 11/15/2012  . PONV (postoperative nausea and vomiting)   . Seizures [facility])     Patient Active Problem List   Diagnosis Date Noted  . Acute diarrhea 05/22/2016  . Abnormal thyroid function test 03/24/2016  . Acute blood loss anemia 03/23/2016  . C. difficile colitis 03/22/2016  . Hypovolemia 03/22/2016  . Bipolar I disorder ([place]) 03/22/2016  . Ulcerative colitis ([place]) 12/29/2015  . CAP (community acquired pneumonia)   . Aspiration pneumonitis ([place]) 11/21/2015  . Bipolar I disorder, most recent episode depressed ([place])   . Bipolar 1 disorder, manic, moderate ([place]) 11/09/2014  . Suicidal ideation   . Major depressive disorder, recurrent episode, moderate ([place])   . Oral thrush 02/23/2014  . Mental developmental delay 11/15/2012      Past Surgical History:  Procedure Laterality Date  . ADENOIDECTOMY    . BIOPSY N/A 12/07/2012   Procedure: GASTRIC BIOPSIES;  Surgeon: [name], MD;  Location: [facility];  Service: Endoscopy;  Laterality: N/A;  . BIOPSY N/A 08/30/2013   Procedure: BIOPSY;  Surgeon: [name], MD;  Location: [facility];  Service: Endoscopy;  Laterality: N/A;  right colon,transverse colon, left colon, rectal biopsies  . BIOPSY  11/20/2015   Procedure: BIOPSY;  Surgeon: [name], MD;  Location: [facility];  Service: Endoscopy;;  ileal, right colon biopsy, left colon, rectum  . BIOPSY  04/18/2016   Procedure: BIOPSY;  Surgeon: [name], MD;  Location: [facility];  Service: Endoscopy;;  left descending colon biopsies  . COLONOSCOPY  11/20/2015   Dr. [name]: Severe erythema, edema, ulcers from the anal verge to 20 cm above the anal verge without mucosal sparing, remaining colon and terminal ileum appeared normal. Biopsies from the rectum revealed fulminant active chronic colitis consistent with IBD. Pathology from terminal ileum revealed intramucosal lymphoid aggregates. Remaining colon random biopsies with inactive chronic colitis  . COLONOSCOPY WITH PROPOFOL N/A 08/30/2013   DPO:EUMPNT mucosa in the terminal ileum/COLITIS/ MILD PROCTITIS. 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  Marland Kitchen COLONOSCOPY WITH PROPOFOL N/A 11/20/2015   Dr.  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 ESOPHAGOGASTRODUODENOSCOPY  11/20/2015   Dr. [name]: Normal exam, stomach biopsied and duodenal biopsy.. Benign biopsies.  . ESOPHAGOGASTRODUODENOSCOPY (EGD) WITH PROPOFOL N/A 12/07/2012   SLF:The mucosa of the esophagus appeared normal Non-erosive gastritis (inflammation) was found in the gastric antrum; multiple biopsies The duodenal mucosa showed no abnormalities in the bulb and  second portion of the duodenum  . ESOPHAGOGASTRODUODENOSCOPY (EGD) WITH PROPOFOL N/A 11/20/2015   Dr. [name]: normal with normal biopsies, negative H.pylori   . ESOPHAGOGASTRODUODENOSCOPY (EGD) WITH PROPOFOL N/A 04/18/2016   Procedure: ESOPHAGOGASTRODUODENOSCOPY (EGD) WITH PROPOFOL;  Surgeon: [name], MD;  Location[facility];  Service: Endoscopy;  Laterality: N/A;  . FLEXIBLE SIGMOIDOSCOPY N/A 04/18/2016   Procedure: FLEXIBLE SIGMOIDOSCOPY;  Surgeon: [name], MD;  Location: [facility];  Service: Endoscopy;  Laterality: N/A;  . TONSILLECTOMY         Home Medications    Prior to Admission medications   Medication Sig Start Date End Date Taking? Authorizing Provider  albuterol (PROVENTIL HFA;VENTOLIN HFA) 108 (90 Base) MCG/ACT inhaler Inhale 2 puffs into the lungs every 4 (four) hours as needed for wheezing or shortness of breath (and/or cough).    Historical Provider, MD  albuterol (PROVENTIL) (2.5 MG/3ML) 0.083% nebulizer solution Inhale 2.5 mg into the lungs every 6 (six) hours as needed for wheezing or shortness of breath.     Historical Provider, MD  atomoxetine (STRATTERA) 100 MG capsule Take 100 mg by mouth daily. Pt takes with a 38m capsule.    Historical Provider, MD  calcium carbonate (TUMS) 500 MG chewable tablet Chew 1 tablet (200 mg of elemental calcium total) by mouth 3 (three) times daily. 04/08/16   SDanie Binder MD  cholecalciferol (VITAMIN D) 1000 units tablet Take 1 tablet (1,000 Units total) by mouth daily. START AFTER VITAMIN D 50,000 U TABLETS ARE COMPLETE 04/08/16   SDanie Binder MD  diphenoxylate-atropine (LOMOTIL) 2.5-0.025 MG tablet 1 PO BID TO REDUCE LOOSE STOOLS 04/16/16   SDanie Binder MD  divalproex (DEPAKOTE) 500 MG DR tablet Take 1 tablet (500 mg total) by mouth 2 (two) times daily. 07/03/15   VPenni Bombard MD  EPINEPHrine (EPIPEN 2-PAK) 0.3 mg/0.3 mL IJ SOAJ injection Inject 0.3 mg into the muscle once as needed (for severe  allergic reaction).    Historical Provider, MD  HYDROcodone-acetaminophen (NORCO/VICODIN) 5-325 MG tablet Take 1-2 tablets by mouth every 4 (four) hours as needed. 05/22/16   SDanie Binder MD  loratadine (CLARITIN) 10 MG tablet Take 10 mg by mouth at bedtime.     Historical Provider, MD  montelukast (SINGULAIR) 10 MG tablet Take 1 tablet (10 mg total) by mouth at bedtime. 11/13/14   LNiel Hummer NP  ondansetron (ZOFRAN) 4 MG tablet 1 PO TID FOR 3 DAYS THEN Q4-6H PRN NAUSEA/VOMTING 05/22/16   SDanie Binder MD  sertraline (ZOLOFT) 100 MG tablet Take 1 tablet (100 mg total) by mouth daily. 11/13/14   LNiel Hummer NP  traZODone (DESYREL) 100 MG tablet Take 100 mg by mouth at bedtime.    Historical Provider, MD  vancomycin (VANCOCIN) 50 mg/mL oral solution Take 5 mLs (250 mg total) by mouth every 6 (six) hours. For 10 days 05/23/16   CArchie Patten MD  Vitamin D, Ergocalciferol, (DRISDOL) 50000 units CAPS capsule Take 1 capsule (50,000 Units total) by mouth every 7 (seven) days. 04/08/16   Sandi LRexene Edison  MD    Family History Family History  Problem Relation Age of Onset  . Asthma Mother   . Ulcers Mother   . Bipolar disorder Mother   . ADD / ADHD Father   . Diabetes Maternal Grandmother   . Diabetes Maternal Grandfather   . Colon cancer Neg Hx   . Liver disease Neg Hx     Social History Social History  Substance Use Topics  . Smoking status: Former Smoker    Packs/day: 0.00    Years: 2.00    Quit date: 10/06/2012  . Smokeless tobacco: Never Used     Comment: Never smoked cigarettes  . Alcohol use No     Comment: denies usage     Allergies   Amoxicillin-pot clavulanate; Ibuprofen; Omeprazole; Pineapple; Strawberry extract; and Tomato   Review of Systems Review of Systems ROS: Statement: All systems negative except as marked or noted in the HPI; Constitutional: Negative for fever and chills. ; ; Eyes: Negative for eye pain, redness and discharge. ; ; ENMT: Negative for ear  pain, hoarseness, nasal congestion, sinus pressure and sore throat. ; ; Cardiovascular: Negative for chest pain, palpitations, diaphoresis, dyspnea and peripheral edema. ; ; Respiratory: Negative for cough, wheezing and stridor. ; ; Gastrointestinal: +diarrhea, abd pain. Negative for nausea, vomiting, blood in stool, hematemesis, jaundice and rectal bleeding. . ; ; Genitourinary: Negative for dysuria, flank pain and hematuria. ; ; Musculoskeletal: Negative for back pain and neck pain. Negative for swelling and trauma.; ; Skin: Negative for pruritus, rash, abrasions, blisters, bruising and skin lesion.; ; Neuro: Negative for headache, lightheadedness and neck stiffness. Negative for weakness, altered level of consciousness, altered mental status, extremity weakness, paresthesias, involuntary movement, seizure and syncope.       Physical Exam Updated Vital Signs BP (!) 133/43 (BP Location: Left Arm)   Pulse 111   Temp 99.2 F (37.3 C) (Oral)   Resp 20   Ht 5' 8"  (1.727 m)   Wt 216 lb (98 kg)   SpO2 100%   BMI 32.84 kg/m   BP 127/79   Pulse 107   Temp 99.2 F (37.3 C) (Oral)   Resp 20   Ht 5' 8"  (1.727 m)   Wt 216 lb (98 kg)   SpO2 99%   BMI 32.84 kg/m    18:33 Orthostatic Vital Signs CS  Orthostatic Lying   BP- Lying: 117/70  Pulse- Lying: 104      Orthostatic Sitting  BP- Sitting: 130/81  Pulse- Sitting: 110      Orthostatic Standing at 0 minutes  BP- Standing at 0 minutes:  122/91  Pulse- Standing at 0 minutes: 117     Physical Exam 1835: Physical examination:  Nursing notes reviewed; Vital signs and O2 SAT reviewed;  Constitutional: Well developed, Well nourished, Well hydrated, In no acute distress; Head:  Normocephalic, atraumatic; Eyes: EOMI, PERRL, No scleral icterus; ENMT: Mouth and pharynx normal, Mucous membranes moist; Neck: Supple, Full range of motion, No lymphadenopathy; Cardiovascular: Tachycardic rate and rhythm, No gallop; Respiratory: Breath sounds  clear & equal bilaterally, No wheezes.  Speaking full sentences with ease, Normal respiratory effort/excursion; Chest: Nontender, Movement normal; Abdomen: Soft, +diffuse tenderness to palp. Nondistended, Normal bowel sounds; Genitourinary: No CVA tenderness; Extremities: Pulses normal, No tenderness, No edema, No calf edema or asymmetry.; Neuro: AA&Ox3, Major CN grossly intact.  Speech clear. No gross focal motor or sensory deficits in extremities.; Skin: Color normal, Warm, Dry.   ED Treatments / Results  Labs (  all labs ordered are listed, but only abnormal results are displayed)   EKG  EKG Interpretation None       Radiology   Procedures Procedures (including critical care time)  Medications Ordered in ED Medications  0.9 %  sodium chloride infusion (not administered)  sodium chloride 0.9 % bolus 1,000 mL (not administered)     Initial Impression / Assessment and Plan / ED Course  I have reviewed the triage vital signs and the nursing notes.  Pertinent labs & imaging results that were available during my care of the patient were reviewed by me and considered in my medical decision making (see chart for details).  MDM Reviewed: previous chart, nursing note and vitals Reviewed previous: labs Interpretation: labs and CT scan   Results for orders placed or performed during the hospital encounter of 05/24/16  Comprehensive metabolic panel  Result Value Ref Range   Sodium 135 135 - 145 mmol/L   Potassium 3.8 3.5 - 5.1 mmol/L   Chloride 101 101 - 111 mmol/L   CO2 27 22 - 32 mmol/L   Glucose, Bld 90 65 - 99 mg/dL   BUN <5 (L) 6 - 20 mg/dL   Creatinine, Ser 0.85 0.61 - 1.24 mg/dL   Calcium 9.0 8.9 - 10.3 mg/dL   Total Protein 6.9 6.5 - 8.1 g/dL   Albumin 3.0 (L) 3.5 - 5.0 g/dL   AST 11 (L) 15 - 41 U/L   ALT 9 (L) 17 - 63 U/L   Alkaline Phosphatase 51 38 - 126 U/L   Total Bilirubin 0.3 0.3 - 1.2 mg/dL   GFR calc non Af Amer >60 >60 mL/min   GFR calc Af Amer >60 >60  mL/min   Anion gap 7 5 - 15  Lipase, blood  Result Value Ref Range   Lipase 13 11 - 51 U/L  CBC with Differential  Result Value Ref Range   WBC 7.8 4.0 - 10.5 K/uL   RBC 3.75 (L) 4.22 - 5.81 MIL/uL   Hemoglobin 10.6 (L) 13.0 - 17.0 g/dL   HCT 32.3 (L) 39.0 - 52.0 %   MCV 86.1 78.0 - 100.0 fL   MCH 28.3 26.0 - 34.0 pg   MCHC 32.8 30.0 - 36.0 g/dL   RDW 12.9 11.5 - 15.5 %   Platelets 297 150 - 400 K/uL   Neutrophils Relative % 60 %   Neutro Abs 4.8 1.7 - 7.7 K/uL   Lymphocytes Relative 17 %   Lymphs Abs 1.3 0.7 - 4.0 K/uL   Monocytes Relative 18 %   Monocytes Absolute 1.4 (H) 0.1 - 1.0 K/uL   Eosinophils Relative 5 %   Eosinophils Absolute 0.4 0.0 - 0.7 K/uL   Basophils Relative 0 %   Basophils Absolute 0.0 0.0 - 0.1 K/uL  Lactic acid, plasma  Result Value Ref Range   Lactic Acid, Venous 1.3 0.5 - 1.9 mmol/L    Ct Abdomen Pelvis W Contrast Result Date: 05/24/2016 CLINICAL DATA:  Abdominal pain and diarrhea. Diagnosed with Clostridium difficile yesterday. EXAM: CT ABDOMEN AND PELVIS WITH CONTRAST TECHNIQUE: Multidetector CT imaging of the abdomen and pelvis was performed using the standard protocol following bolus administration of intravenous contrast. CONTRAST:  188m ISOVUE-300 IOPAMIDOL (ISOVUE-300) INJECTION 61% COMPARISON:  Acute abdominal series April 17, 2016 and CT abdomen and pelvis March 18, 2016 FINDINGS: LOWER CHEST: Lung bases are clear. Included heart size is normal. No pericardial effusion. HEPATOBILIARY: Liver and gallbladder are normal. PANCREAS: Normal. SPLEEN: Normal. ADRENALS/URINARY TRACT:  Kidneys are orthotopic, demonstrating symmetric enhancement. No nephrolithiasis, hydronephrosis or solid renal masses. The unopacified ureters are normal in course and caliber. Urinary bladder is partially distended and unremarkable. Normal adrenal glands. STOMACH/BOWEL: Diffuse colonic wall thickening from the cecum, moderate within the sigmoid colon with pericolonic  inflammation. The stomach, small bowel are normal in course and caliber without inflammatory changes. Normal appendix. VASCULAR/LYMPHATIC: Aortoiliac vessels are normal in course and caliber. No lymphadenopathy by CT size criteria. Small scattered mesenteric lymph nodes. REPRODUCTIVE: Normal. OTHER: No intraperitoneal free fluid or free air. MUSCULOSKELETAL: Nonacute. Focally irregular sclerosis bilateral femoral heads without collapse. Scattered Schmorl's nodes. IMPRESSION: Diffuse colitis compatible with history of Clostridium difficile, no immediate complication. 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 Electronically Signed   By: Elon Alas M.D.   On: 05/24/2016 21:49    2330:  WBC count and lactic acid normal. Pt remains afebrile. HR improving with IVF; now at baseline. Unable to reach GI MD on call. Pt has stooled multiple times while in the ED. Will observation admit. T/C to [facility] Dr. Darrick Meigs, case discussed, including:  HPI, pertinent PM/SHx, VS/PE, dx testing, ED course and treatment:  Agreeable to admit, requests to write temporary orders, obtain observation medical bed to team [facility].   Final Clinical Impressions(s) / ED Diagnoses   Final diagnoses:  None    New Prescriptions New Prescriptions   No medications on file     Francine Graven, DO 05/28/16 1557

## 2016-05-24 NOTE — H&P (Addendum)
TRH H&P    Patient Demographics:    Wayne Avila, is a 25 y.o. male  MRN: [id number]  DOB - 1990/08/17  Admit Date - 05/24/2016  Referring MD/NP/PA: Dr. Thurnell Garbe  Outpatient Primary MD for the patient is [name], MD  Patient coming from: Home  Chief Complaint  Patient presents with  . Abdominal Pain      HPI:    Wayne Avila  is a 25 y.o. male, With history of ulcerative colitis who came to hospital with worsening episodes of diarrhea. Patient was seen in the ED yesterday and was discharged on by mouth vancomycin. Today he was called at home to come to hospital as stool for C. difficile was positive. In the ED CT of the abdomen pelvis was done which showed diffuse colitis consistent with C. Difficile. He denies nausea or vomiting. No chest pain or shortness of breath. No fever Still complains of abdominal pain     Review of systems:    In addition to the HPI above,  No Fever-chills, No Headache, No changes with Vision or hearing, No problems swallowing food or Liquids, No Chest pain, Cough or Shortness of Breath, No Blood in stool or Urine, No dysuria, No new skin rashes or bruises, No new joints pains-aches,  No new weakness, tingling, numbness in any extremity, No recent weight gain or loss, No polyuria, polydypsia or polyphagia, No significant Mental Stressors.  A full 10 point Review of Systems was done, except as stated above, all other Review of Systems were negative.   With Past History of the following :    Past Medical History:  Diagnosis Date  . Abdominal pain, chronic, right upper quadrant   . ADHD (attention deficit hyperactivity disorder)   . Anxiety   . Asthma   . Bipolar 1 disorder ([name])   . Colitis   . Depression   . GERD (gastroesophageal reflux disease)   . Hearing loss   . HOH (hard of hearing)   . Mental developmental delay 11/15/2012  . PONV  (postoperative nausea and vomiting)   . Seizures ([place])       Past Surgical History:  Procedure Laterality Date  . ADENOIDECTOMY    . BIOPSY N/A 12/07/2012   Procedure: GASTRIC BIOPSIES;  Surgeon: [name], MD;  Location: [facility];  Service: Endoscopy;  Laterality: N/A;  . BIOPSY N/A 08/30/2013   Procedure: BIOPSY;  Surgeon: [name], MD;  Location: [facility];  Service: Endoscopy;  Laterality: N/A;  right colon,transverse colon, left colon, rectal biopsies  . BIOPSY  11/20/2015   Procedure: BIOPSY;  Surgeon: [name], MD;  Location: [facility];  Service: Endoscopy;;  ileal, right colon biopsy, left colon, rectum  . BIOPSY  04/18/2016   Procedure: BIOPSY;  Surgeon: [name], MD;  Location: [facility];  Service: Endoscopy;;  left descending colon biopsies  . COLONOSCOPY  11/20/2015   Dr. [name]: Severe erythema, edema, ulcers from the anal verge to 20 cm above the anal verge without mucosal sparing,  remaining colon and terminal ileum appeared normal. Biopsies from the rectum revealed fulminant active chronic colitis consistent with IBD. Pathology from terminal ileum revealed intramucosal lymphoid aggregates. Remaining colon random biopsies with inactive chronic colitis  . COLONOSCOPY WITH PROPOFOL N/A 08/30/2013   ENM:MHWKGS mucosa in the terminal ileum/COLITIS/ MILD PROCTITIS. 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  Marland Kitchen COLONOSCOPY WITH PROPOFOL N/A 11/20/2015   Dr. 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 ESOPHAGOGASTRODUODENOSCOPY  11/20/2015   Dr. [name]: Normal exam, stomach biopsied and duodenal biopsy.. Benign biopsies.  . ESOPHAGOGASTRODUODENOSCOPY (EGD) WITH PROPOFOL N/A 12/07/2012   SLF:The mucosa of the esophagus appeared normal Non-erosive gastritis (inflammation) was found in the gastric antrum; multiple biopsies The  duodenal mucosa showed no abnormalities in the bulb and second portion of the duodenum  . ESOPHAGOGASTRODUODENOSCOPY (EGD) WITH PROPOFOL N/A 11/20/2015   Dr. [name]: normal with normal biopsies, negative H.pylori   . ESOPHAGOGASTRODUODENOSCOPY (EGD) WITH PROPOFOL N/A 04/18/2016   Procedure: ESOPHAGOGASTRODUODENOSCOPY (EGD) WITH PROPOFOL;  Surgeon: [name], MD;  [facility];  Service: Endoscopy;  Laterality: N/A;  . FLEXIBLE SIGMOIDOSCOPY N/A 04/18/2016   Procedure: FLEXIBLE SIGMOIDOSCOPY;  Surgeon: [name], MD;  Location: [facility];  Service: Endoscopy;  Laterality: N/A;  . TONSILLECTOMY        Social History:      Social History  Substance Use Topics  . Smoking status: Former Smoker    Packs/day: 0.00    Years: 2.00    Quit date: 10/06/2012  . Smokeless tobacco: Never Used     Comment: Never smoked cigarettes  . Alcohol use No     Comment: denies usage       Family History :     Family History  Problem Relation Age of Onset  . Asthma Mother   . Ulcers Mother   . Bipolar disorder Mother   . ADD / ADHD Father   . Diabetes Maternal Grandmother   . Diabetes Maternal Grandfather   . Colon cancer Neg Hx   . Liver disease Neg Hx       Home Medications:   Prior to Admission medications   Medication Sig Start Date End Date Taking? Authorizing Provider  albuterol (PROVENTIL HFA;VENTOLIN HFA) 108 (90 Base) MCG/ACT inhaler Inhale 2 puffs into the lungs every 4 (four) hours as needed for wheezing or shortness of breath (and/or cough).   Yes Historical Provider, MD  albuterol (PROVENTIL) (2.5 MG/3ML) 0.083% nebulizer solution Inhale 2.5 mg into the lungs every 6 (six) hours as needed for wheezing or shortness of breath.    Yes Historical Provider, MD  atomoxetine (STRATTERA) 100 MG capsule Take 100 mg by mouth daily. Pt takes with a 68m capsule.   Yes Historical Provider, MD  calcium carbonate (TUMS) 500 MG chewable tablet Chew 1 tablet (200 mg of  elemental calcium total) by mouth 3 (three) times daily. 04/08/16  Yes SDanie Binder MD  cholecalciferol (VITAMIN D) 1000 units tablet Take 1 tablet (1,000 Units total) by mouth daily. START AFTER VITAMIN D 50,000 U TABLETS ARE COMPLETE 04/08/16  Yes SDanie Binder MD  divalproex (DEPAKOTE) 500 MG DR tablet Take 1 tablet (500 mg total) by mouth 2 (two) times daily. 07/03/15  Yes VPenni Bombard MD  EPINEPHrine (EPIPEN 2-PAK) 0.3 mg/0.3 mL IJ SOAJ injection Inject 0.3 mg into the muscle once as needed (  for severe allergic reaction).   Yes Historical Provider, MD  HYDROcodone-acetaminophen (NORCO/VICODIN) 5-325 MG tablet Take 1-2 tablets by mouth every 4 (four) hours as needed. 05/22/16  Yes [name], MD  loratadine (CLARITIN) 10 MG tablet Take 10 mg by mouth at bedtime.    Yes Historical Provider, MD  montelukast (SINGULAIR) 10 MG tablet Take 1 tablet (10 mg total) by mouth at bedtime. 11/13/14  Yes [name], NP  ondansetron (ZOFRAN) 4 MG tablet 1 PO TID FOR 3 DAYS THEN Q4-6H PRN NAUSEA/VOMTING 05/22/16  Yes [name], MD  sertraline (ZOLOFT) 100 MG tablet Take 1 tablet (100 mg total) by mouth daily. 11/13/14  Yes [name], NP  traZODone (DESYREL) 100 MG tablet Take 100 mg by mouth at bedtime.   Yes Historical Provider, MD  vancomycin (VANCOCIN) 50 mg/mL oral solution Take 5 mLs (250 mg total) by mouth every 6 (six) hours. For 10 days 05/23/16  Yes Archie Patten, MD  Vitamin D, Ergocalciferol, (DRISDOL) 50000 units CAPS capsule Take 1 capsule (50,000 Units total) by mouth every 7 (seven) days. 04/08/16  Yes [name], MD     Allergies:     Allergies  Allergen Reactions  . Amoxicillin-Pot Clavulanate Nausea And Vomiting and Other (See Comments)    Has patient had a PCN reaction causing immediate rash, facial/tongue/throat swelling, SOB or lightheadedness with hypotension: No Has patient had a PCN reaction causing severe rash involving mucus membranes or skin  necrosis: No Has patient had a PCN reaction that required hospitalization No Has patient had a PCN reaction occurring within the last 10 years: No If all of the above answers are "NO", then may proceed with Cephalosporin use.  . Ibuprofen Other (See Comments)    Per pts MD he is unable to take this medication.   . Omeprazole Nausea And Vomiting  . Pineapple Swelling and Other (See Comments)    Reaction:  Lip swelling  . Strawberry Extract Swelling and Other (See Comments)    Reaction:  Lip swelling  . Tomato Rash     Physical Exam:   Vitals  Blood pressure 125/66, pulse 111, temperature 99.6 F (37.6 C), temperature source Oral, resp. rate 20, height 5' 8"  (1.727 m), weight 98 kg (216 lb), SpO2 99 %.  1.  General: Appears in no acute distress  2. Psychiatric:  Intact judgement and  insight, awake alert, oriented x 3.  3. Neurologic: No focal neurological deficits, all cranial nerves intact.Strength 5/5 all 4 extremities, sensation intact all 4 extremities, plantars down going.  4. Eyes :  anicteric sclerae, moist conjunctivae with no lid lag. PERRLA.  5. ENMT:  Oropharynx clear with moist mucous membranes and good dentition  6. Neck:  supple, no cervical lymphadenopathy appriciated, No thyromegaly  7. Respiratory : Normal respiratory effort, good air movement bilaterally,clear to  auscultation bilaterally  8. Cardiovascular : RRR, no gallops, rubs or murmurs, no leg edema  9. Gastrointestinal:  Positive bowel sounds, abdomen soft, +tender to palpation(generalized),no hepatosplenomegaly, no rigidity or guarding       10. Skin:  No cyanosis, normal texture and turgor, no rash, lesions or ulcers  11.Musculoskeletal:  Good muscle tone,  joints appear normal , no effusions,  normal range of motion    Data Review:    CBC  Recent Labs Lab 05/19/16 0005 05/22/16 1230 05/23/16 0954 05/24/16 1927  WBC 5.7 11.6* 9.5 7.8  HGB 11.0* 12.2* 10.6* 10.6*  HCT 32.8*  37.2* 31.4* 32.3*  PLT 260 319 249 297  MCV 85.6 85.5 86.0 86.1  MCH 28.7 28.0 29.0 28.3  MCHC 33.5 32.8 33.8 32.8  RDW 12.9 13.0 13.1 12.9  LYMPHSABS 1.9 1.5  --  1.3  MONOABS 1.2* 2.8*  --  1.4*  EOSABS 0.3 0.3  --  0.4  BASOSABS 0.0 0.0  --  0.0   ------------------------------------------------------------------------------------------------------------------  Chemistries   Recent Labs Lab 05/19/16 0005 05/22/16 1230 05/23/16 0954 05/24/16 1927  NA 132* 136 135 135  K 3.5 4.1 3.6 3.8  CL 101 104 101 101  CO2 25 23 25 27   GLUCOSE 85 101* 106* 90  BUN 12 9 7  <5*  CREATININE 0.79 0.93 0.92 0.85  CALCIUM 8.7* 9.3 8.6* 9.0  AST 17 16 11* 11*  ALT 15* 12* 10* 9*  ALKPHOS 46 56 46 51  BILITOT 0.3 0.5 0.2* 0.3   ------------------------------------------------------------------------------------------------------------------  ------------------------------------------------------------------------------------------------------------------ GFR: Estimated Creatinine Clearance: 153.3 mL/min (by C-G formula based on SCr of 0.85 mg/dL). Liver Function Tests:  Recent Labs Lab 05/19/16 0005 05/22/16 1230 05/23/16 0954 05/24/16 1927  AST 17 16 11* 11*  ALT 15* 12* 10* 9*  ALKPHOS 46 56 46 51  BILITOT 0.3 0.5 0.2* 0.3  PROT 6.6 7.2 6.7 6.9  ALBUMIN 3.2* 3.5 3.1* 3.0*    Recent Labs Lab 05/23/16 0954 05/24/16 1927  LIPASE 18 13   No results for input(s): AMMONIA in the last 168 hours. Coagulation Profile: No results for input(s): INR, PROTIME in the last 168 hours. Cardiac Enzymes:  Recent Labs Lab 05/19/16 0005  TROPONINI <0.03    --------------------------------------------------------------------------------------------------------------- Urine analysis:    Component Value Date/Time   COLORURINE YELLOW 05/23/2016 0940   APPEARANCEUR CLEAR 05/23/2016 0940   LABSPEC 1.021 05/23/2016 0940   PHURINE 5.0 05/23/2016 0940   GLUCOSEU NEGATIVE 05/23/2016  0940   HGBUR NEGATIVE 05/23/2016 0940   BILIRUBINUR NEGATIVE 05/23/2016 0940   KETONESUR NEGATIVE 05/23/2016 0940   PROTEINUR NEGATIVE 05/23/2016 0940   UROBILINOGEN 0.2 09/04/2014 2355   NITRITE NEGATIVE 05/23/2016 0940   LEUKOCYTESUR NEGATIVE 05/23/2016 0940      Imaging Results:    Ct Abdomen Pelvis W Contrast  Result Date: 05/24/2016 CLINICAL DATA:  Abdominal pain and diarrhea. Diagnosed with Clostridium difficile yesterday. EXAM: CT ABDOMEN AND PELVIS WITH CONTRAST TECHNIQUE: Multidetector CT imaging of the abdomen and pelvis was performed using the standard protocol following bolus administration of intravenous contrast. CONTRAST:  182m ISOVUE-300 IOPAMIDOL (ISOVUE-300) INJECTION 61% COMPARISON:  Acute abdominal series April 17, 2016 and CT abdomen and pelvis March 18, 2016 FINDINGS: LOWER CHEST: Lung bases are clear. Included heart size is normal. No pericardial effusion. HEPATOBILIARY: Liver and gallbladder are normal. PANCREAS: Normal. SPLEEN: Normal. ADRENALS/URINARY TRACT: Kidneys are orthotopic, demonstrating symmetric enhancement. No nephrolithiasis, hydronephrosis or solid renal masses. The unopacified ureters are normal in course and caliber. Urinary bladder is partially distended and unremarkable. Normal adrenal glands. STOMACH/BOWEL: Diffuse colonic wall thickening from the cecum, moderate within the sigmoid colon with pericolonic inflammation. The stomach, small bowel are normal in course and caliber without inflammatory changes. Normal appendix. VASCULAR/LYMPHATIC: Aortoiliac vessels are normal in course and caliber. No lymphadenopathy by CT size criteria. Small scattered mesenteric lymph nodes. REPRODUCTIVE: Normal. OTHER: No intraperitoneal free fluid or free air. MUSCULOSKELETAL: Nonacute. Focally irregular sclerosis bilateral femoral heads without collapse. Scattered Schmorl's nodes. IMPRESSION: Diffuse colitis compatible with history of Clostridium difficile, no immediate  complication. 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 Electronically Signed  By: Elon Alas M.D.   On: 05/24/2016 21:49       Assessment & Plan:    Active Problems:   Ulcerative colitis (HCC)   C. difficile colitis   Bipolar I disorder ([place])   Clostridium difficile colitis   1. C. difficile colitis- place under observation, continue vancomycin 250 mg every 6 hours. Patient still having significant diarrhea, continue IV fluids normal saline at 125 ML per hour.  2. History of ulcerative colitis- followed by gastroenterology as outpatient. 3. Bipolar disorder-stable, continue Depakote, Zoloft.   DVT Prophylaxis-   Lovenox   AM Labs Ordered, also please review Full Orders  Family Communication: Admission, patients condition and plan of care including tests being ordered have been discussed with the patient and his mother at bedside who indicate understanding and agree with the plan and Code Status.  Code Status:  Full code  Admission status: Observation    Time spent in minutes : 60 minutes   Charnay Nazario S M.D on 05/24/2016 at 11:55 PM  Between 7am to 7pm - Pager - [phone]. After 7pm go to [email or web address] - password [facility]  [facility] - Office  [phone]

## 2016-05-24 NOTE — ED Triage Notes (Addendum)
Pt returns for abdominal pain and diarrhea. Pt had stool sample taken yesterday, was contacted and told he had c diff.

## 2016-05-25 ENCOUNTER — Encounter (HOSPITAL_COMMUNITY): Payer: Self-pay | Admitting: *Deleted

## 2016-05-25 DIAGNOSIS — K51018 Ulcerative (chronic) pancolitis with other complication: Secondary | ICD-10-CM | POA: Diagnosis not present

## 2016-05-25 DIAGNOSIS — Z23 Encounter for immunization: Secondary | ICD-10-CM | POA: Diagnosis not present

## 2016-05-25 DIAGNOSIS — F319 Bipolar disorder, unspecified: Secondary | ICD-10-CM | POA: Diagnosis not present

## 2016-05-25 DIAGNOSIS — R6250 Unspecified lack of expected normal physiological development in childhood: Secondary | ICD-10-CM | POA: Diagnosis present

## 2016-05-25 DIAGNOSIS — Z833 Family history of diabetes mellitus: Secondary | ICD-10-CM | POA: Diagnosis not present

## 2016-05-25 DIAGNOSIS — E876 Hypokalemia: Secondary | ICD-10-CM | POA: Diagnosis present

## 2016-05-25 DIAGNOSIS — Z91018 Allergy to other foods: Secondary | ICD-10-CM | POA: Diagnosis not present

## 2016-05-25 DIAGNOSIS — Z825 Family history of asthma and other chronic lower respiratory diseases: Secondary | ICD-10-CM | POA: Diagnosis not present

## 2016-05-25 DIAGNOSIS — A0472 Enterocolitis due to Clostridium difficile, not specified as recurrent: Secondary | ICD-10-CM | POA: Diagnosis not present

## 2016-05-25 DIAGNOSIS — J45909 Unspecified asthma, uncomplicated: Secondary | ICD-10-CM | POA: Diagnosis present

## 2016-05-25 DIAGNOSIS — K5190 Ulcerative colitis, unspecified, without complications: Secondary | ICD-10-CM | POA: Diagnosis present

## 2016-05-25 DIAGNOSIS — Z79899 Other long term (current) drug therapy: Secondary | ICD-10-CM | POA: Diagnosis not present

## 2016-05-25 DIAGNOSIS — Z87891 Personal history of nicotine dependence: Secondary | ICD-10-CM | POA: Diagnosis not present

## 2016-05-25 DIAGNOSIS — H9190 Unspecified hearing loss, unspecified ear: Secondary | ICD-10-CM | POA: Diagnosis present

## 2016-05-25 DIAGNOSIS — Z818 Family history of other mental and behavioral disorders: Secondary | ICD-10-CM | POA: Diagnosis not present

## 2016-05-25 DIAGNOSIS — R109 Unspecified abdominal pain: Secondary | ICD-10-CM | POA: Diagnosis present

## 2016-05-25 DIAGNOSIS — Z880 Allergy status to penicillin: Secondary | ICD-10-CM | POA: Diagnosis not present

## 2016-05-25 DIAGNOSIS — G8929 Other chronic pain: Secondary | ICD-10-CM | POA: Diagnosis present

## 2016-05-25 DIAGNOSIS — F909 Attention-deficit hyperactivity disorder, unspecified type: Secondary | ICD-10-CM | POA: Diagnosis present

## 2016-05-25 DIAGNOSIS — F419 Anxiety disorder, unspecified: Secondary | ICD-10-CM | POA: Diagnosis present

## 2016-05-25 DIAGNOSIS — Z888 Allergy status to other drugs, medicaments and biological substances status: Secondary | ICD-10-CM | POA: Diagnosis not present

## 2016-05-25 DIAGNOSIS — K219 Gastro-esophageal reflux disease without esophagitis: Secondary | ICD-10-CM | POA: Diagnosis present

## 2016-05-25 DIAGNOSIS — Z886 Allergy status to analgesic agent status: Secondary | ICD-10-CM | POA: Diagnosis not present

## 2016-05-25 LAB — COMPREHENSIVE METABOLIC PANEL
ALT: 8 U/L — ABNORMAL LOW (ref 17–63)
AST: 10 U/L — ABNORMAL LOW (ref 15–41)
Albumin: 2.6 g/dL — ABNORMAL LOW (ref 3.5–5.0)
Alkaline Phosphatase: 44 U/L (ref 38–126)
Anion gap: 10 (ref 5–15)
BUN: <5 mg/dL — ABNORMAL LOW (ref 6–20)
CO2: 23 mmol/L (ref 22–32)
Calcium: 8.3 mg/dL — ABNORMAL LOW (ref 8.9–10.3)
Chloride: 107 mmol/L (ref 101–111)
Creatinine, Ser: 0.84 mg/dL (ref 0.61–1.24)
GFR calc Af Amer: >60 mL/min (ref >60)
GFR calc non Af Amer: >60 mL/min (ref >60)
Glucose, Bld: 88 mg/dL (ref 65–99)
Potassium: 3.4 mmol/L — ABNORMAL LOW (ref 3.5–5.1)
Sodium: 140 mmol/L (ref 135–145)
Total Bilirubin: 0.3 mg/dL (ref 0.3–1.2)
Total Protein: 5.8 g/dL — ABNORMAL LOW (ref 6.5–8.1)

## 2016-05-25 LAB — CBC
HCT: 29.2 % — ABNORMAL LOW (ref 39.0–52.0)
Hemoglobin: 9.8 g/dL — ABNORMAL LOW (ref 13.0–17.0)
MCH: 28.7 pg (ref 26.0–34.0)
MCHC: 33.6 g/dL (ref 30.0–36.0)
MCV: 85.4 fL (ref 78.0–100.0)
Platelets: 266 10*3/uL (ref 150–400)
RBC: 3.42 MIL/uL — ABNORMAL LOW (ref 4.22–5.81)
RDW: 13 % (ref 11.5–15.5)
WBC: 7.1 10*3/uL (ref 4.0–10.5)

## 2016-05-25 MED ORDER — ONDANSETRON HCL 4 MG/2ML IJ SOLN: 4.0000 mg | Freq: Three times a day (TID) | INTRAMUSCULAR | Status: AC | PRN

## 2016-05-25 MED ORDER — MONTELUKAST SODIUM 10 MG PO TABS
10.0000 mg | ORAL_TABLET | Freq: Every day | ORAL | Status: DC
  Administered 2016-05-25 (×2): 10 mg via ORAL
  Filled 2016-05-25 (×2): qty 1

## 2016-05-25 MED ORDER — HYDROMORPHONE HCL 1 MG/ML IJ SOLN: 1.0000 mg | INTRAMUSCULAR | Status: AC | PRN

## 2016-05-25 MED ORDER — LORATADINE 10 MG PO TABS
10.0000 mg | ORAL_TABLET | Freq: Every day | ORAL | Status: DC
  Administered 2016-05-25 (×2): 10 mg via ORAL
  Filled 2016-05-25 (×2): qty 1

## 2016-05-25 MED ORDER — ONDANSETRON HCL 4 MG/2ML IJ SOLN
4.0000 mg | Freq: Four times a day (QID) | INTRAMUSCULAR | Status: DC | PRN
  Administered 2016-05-25 – 2016-05-26 (×3): 4 mg via INTRAVENOUS
  Filled 2016-05-25 (×3): qty 2

## 2016-05-25 MED ORDER — SERTRALINE HCL 50 MG PO TABS
100.0000 mg | ORAL_TABLET | Freq: Every day | ORAL | Status: DC
  Administered 2016-05-25 – 2016-05-26 (×2): 100 mg via ORAL
  Filled 2016-05-25 (×2): qty 2

## 2016-05-25 MED ORDER — VANCOMYCIN 50 MG/ML ORAL SOLUTION
ORAL | Status: AC
  Filled 2016-05-25: qty 5

## 2016-05-25 MED ORDER — TRAZODONE HCL 50 MG PO TABS
100.0000 mg | ORAL_TABLET | Freq: Every day | ORAL | Status: DC
  Administered 2016-05-25 (×2): 100 mg via ORAL
  Filled 2016-05-25 (×2): qty 2

## 2016-05-25 MED ORDER — DIVALPROEX SODIUM 250 MG PO DR TAB
500.0000 mg | DELAYED_RELEASE_TABLET | Freq: Two times a day (BID) | ORAL | Status: DC
  Administered 2016-05-25 – 2016-05-26 (×4): 500 mg via ORAL
  Filled 2016-05-25 (×4): qty 2

## 2016-05-25 MED ORDER — VANCOMYCIN 50 MG/ML ORAL SOLUTION
250.0000 mg | Freq: Four times a day (QID) | ORAL | Status: DC
  Administered 2016-05-25 – 2016-05-26 (×7): 250 mg via ORAL
  Filled 2016-05-25 (×15): qty 5

## 2016-05-25 MED ORDER — MORPHINE SULFATE (PF) 2 MG/ML IV SOLN
2.0000 mg | INTRAVENOUS | Status: DC | PRN
  Administered 2016-05-25 – 2016-05-26 (×6): 2 mg via INTRAVENOUS
  Filled 2016-05-25 (×6): qty 1

## 2016-05-25 MED ORDER — PNEUMOCOCCAL VAC POLYVALENT 25 MCG/0.5ML IJ INJ
0.5000 mL | INJECTION | INTRAMUSCULAR | Status: AC
  Administered 2016-05-26: 0.5 mL via INTRAMUSCULAR

## 2016-05-25 MED ORDER — ATOMOXETINE HCL 25 MG PO CAPS
100.0000 mg | ORAL_CAPSULE | Freq: Every day | ORAL | Status: DC
  Administered 2016-05-26: 100 mg via ORAL

## 2016-05-25 MED ORDER — ENOXAPARIN SODIUM 40 MG/0.4ML ~~LOC~~ SOLN
40.0000 mg | SUBCUTANEOUS | Status: DC
  Administered 2016-05-25: 40 mg via SUBCUTANEOUS
  Filled 2016-05-25 (×2): qty 0.4

## 2016-05-25 MED ORDER — ALBUTEROL SULFATE (2.5 MG/3ML) 0.083% IN NEBU: 3.0000 mL | INHALATION_SOLUTION | RESPIRATORY_TRACT | Status: DC | PRN

## 2016-05-25 MED ORDER — POTASSIUM CHLORIDE CRYS ER 20 MEQ PO TBCR: 40.0000 meq | EXTENDED_RELEASE_TABLET | Freq: Once | ORAL | Status: DC

## 2016-05-25 MED ORDER — SODIUM CHLORIDE 0.9 % IV SOLN
INTRAVENOUS | Status: AC
  Administered 2016-05-25: 02:00:00 via INTRAVENOUS

## 2016-05-25 MED ORDER — ONDANSETRON HCL 4 MG PO TABS: 4.0000 mg | ORAL_TABLET | Freq: Four times a day (QID) | ORAL | Status: DC | PRN

## 2016-05-25 NOTE — Progress Notes (Signed)
PROGRESS NOTE    Wayne Avila  EGB:[id number] DOB: Mar 30, 1991 DOA: 05/24/2016 PCP: [name], MD    Brief Narrative:  25 y/o with h/o ulcerative colitis, who has mad multiple admissions in the past months for diarrhea and C diff, was admitted to the hospital with recurrent diarrhea and positive stool studies for C diff. He has been started on vancomycin. Once stools are manageable, he can be discharged home. He has been advised to inform staff of every time he has a bowel movement so that it may be documented.   Assessment & Plan:   Active Problems:   Ulcerative colitis (HCC)   C. difficile colitis   Bipolar I disorder ([place])   Clostridium difficile colitis   1. C diff colitis. Patient has been having frequent stools. Started on oral vancomycin. C diff antigen positive, toxin negative, but PCR is positive. Will continue with oral vancomycin. He is not having any vomiting. Once stools are manageable, he can be discharged home to complete his course.  2. Ulcerative colitis. Follows with Dr. [name]. Last dose of remicaide was beginning of November  3. Bipolar disorder. Continue psychotropics  4. Hypokalemia. replace   DVT prophylaxis: lovenox Code Status: full Family Communication: discussed with mother at bedside Disposition Plan: discharge home once improved   Consultants:     Procedures:     Antimicrobials:   Vancomycin 12/17>>   Subjective: Tolerating liquids. No vomiting. Still having frequent stools  Objective: Vitals:   05/25/16 0016 05/25/16 0106 05/25/16 0700 05/25/16 1332  BP:  139/89 (!) 143/79 135/72  Pulse:  (!) 110 62 99  Resp: 20 20 20 20   Temp:  99.5 F (37.5 C) 97.9 F (36.6 C) 98.3 F (36.8 C)  TempSrc:  Oral Oral Oral  SpO2:  100% 98% 99%  Weight:  99.3 kg (219 lb)    Height:  5' 8"  (1.727 m)      Intake/Output Summary (Last 24 hours) at 05/25/16 1843 Last data filed at 05/25/16 1500  Gross per 24 hour  Intake           4483.33 ml  Output                0 ml  Net          4483.33 ml   Filed Weights   05/24/16 1757 05/25/16 0106  Weight: 98 kg (216 lb) 99.3 kg (219 lb)    Examination:  General exam: Appears calm and comfortable  Respiratory system: Clear to auscultation. Respiratory effort normal. Cardiovascular system: S1 & S2 heard, RRR. No JVD, murmurs, rubs, gallops or clicks. No pedal edema. Gastrointestinal system: Abdomen is nondistended, soft and nontender. No organomegaly or masses felt. Normal bowel sounds heard. Central nervous system: Alert and oriented. No focal neurological deficits. Extremities: Symmetric 5 x 5 power. Skin: No rashes, lesions or ulcers Psychiatry: Judgement and insight appear normal. Mood & affect appropriate.     Data Reviewed: I have personally reviewed following labs and imaging studies  CBC:  Recent Labs Lab 05/19/16 0005 05/22/16 1230 05/23/16 0954 05/24/16 1927 05/25/16 0651  WBC 5.7 11.6* 9.5 7.8 7.1  NEUTROABS 2.4 6.9  --  4.8  --   HGB 11.0* 12.2* 10.6* 10.6* 9.8*  HCT 32.8* 37.2* 31.4* 32.3* 29.2*  MCV 85.6 85.5 86.0 86.1 85.4  PLT 260 319 249 297 371   Basic Metabolic Panel:  Recent Labs Lab 05/19/16 0005 05/22/16 1230 05/23/16 0954 05/24/16 1927 05/25/16 0626  NA 132* 136 135 135 140  K 3.5 4.1 3.6 3.8 3.4*  CL 101 104 101 101 107  CO2 25 23 25 27 23   GLUCOSE 85 101* 106* 90 88  BUN 12 9 7  <5* <5*  CREATININE 0.79 0.93 0.92 0.85 0.84  CALCIUM 8.7* 9.3 8.6* 9.0 8.3*   GFR: Estimated Creatinine Clearance: 156.3 mL/min (by C-G formula based on SCr of 0.84 mg/dL). Liver Function Tests:  Recent Labs Lab 05/19/16 0005 05/22/16 1230 05/23/16 0954 05/24/16 1927 05/25/16 0651  AST 17 16 11* 11* 10*  ALT 15* 12* 10* 9* 8*  ALKPHOS 46 56 46 51 44  BILITOT 0.3 0.5 0.2* 0.3 0.3  PROT 6.6 7.2 6.7 6.9 5.8*  ALBUMIN 3.2* 3.5 3.1* 3.0* 2.6*    Recent Labs Lab 05/23/16 0954 05/24/16 1927  LIPASE 18 13   No results for  input(s): AMMONIA in the last 168 hours. Coagulation Profile: No results for input(s): INR, PROTIME in the last 168 hours. Cardiac Enzymes:  Recent Labs Lab 05/19/16 0005  TROPONINI <0.03   BNP (last 3 results) No results for input(s): PROBNP in the last 8760 hours. HbA1C: No results for input(s): HGBA1C in the last 72 hours. CBG: No results for input(s): GLUCAP in the last 168 hours. Lipid Profile: No results for input(s): CHOL, HDL, LDLCALC, TRIG, CHOLHDL, LDLDIRECT in the last 72 hours. Thyroid Function Tests: No results for input(s): TSH, T4TOTAL, FREET4, T3FREE, THYROIDAB in the last 72 hours. Anemia Panel: No results for input(s): VITAMINB12, FOLATE, FERRITIN, TIBC, IRON, RETICCTPCT in the last 72 hours. Sepsis Labs:  Recent Labs Lab 05/24/16 1927 05/24/16 2302  LATICACIDVEN 1.3 0.9    Recent Results (from the past 240 hour(s))  C difficile quick scan w PCR reflex     Status: Abnormal   Collection Time: 05/23/16  9:40 AM  Result Value Ref Range Status   C Diff antigen POSITIVE (A) NEGATIVE Final   C Diff toxin NEGATIVE NEGATIVE Final   C Diff interpretation Results are indeterminate. See PCR results.  Final  Clostridium Difficile by PCR     Status: Abnormal   Collection Time: 05/23/16  9:40 AM  Result Value Ref Range Status   Toxigenic C Difficile by pcr POSITIVE (A) NEGATIVE Final    Comment: Positive for toxigenic C. difficile with little to no toxin production. Only treat if clinical presentation suggests symptomatic illness. Performed at [facility]   Gastrointestinal Panel by PCR , Stool     Status: None   Collection Time: 05/23/16  9:41 AM  Result Value Ref Range Status   Campylobacter species NOT DETECTED NOT DETECTED Final   Plesimonas shigelloides NOT DETECTED NOT DETECTED Final   Salmonella species NOT DETECTED NOT DETECTED Final   Yersinia enterocolitica NOT DETECTED NOT DETECTED Final   Vibrio species NOT DETECTED NOT DETECTED Final    Vibrio cholerae NOT DETECTED NOT DETECTED Final   Enteroaggregative E coli (EAEC) NOT DETECTED NOT DETECTED Final   Enteropathogenic E coli (EPEC) NOT DETECTED NOT DETECTED Final   Enterotoxigenic E coli (ETEC) NOT DETECTED NOT DETECTED Final   Shiga like toxin producing E coli (STEC) NOT DETECTED NOT DETECTED Final   Shigella/Enteroinvasive E coli (EIEC) NOT DETECTED NOT DETECTED Final   Cryptosporidium NOT DETECTED NOT DETECTED Final   Cyclospora cayetanensis NOT DETECTED NOT DETECTED Final   Entamoeba histolytica NOT DETECTED NOT DETECTED Final   Giardia lamblia NOT DETECTED NOT DETECTED Final   Adenovirus F40/41 NOT DETECTED NOT  DETECTED Final   Astrovirus NOT DETECTED NOT DETECTED Final   Norovirus GI/GII NOT DETECTED NOT DETECTED Final   Rotavirus A NOT DETECTED NOT DETECTED Final   Sapovirus (I, II, IV, and V) NOT DETECTED NOT DETECTED Final         Radiology Studies: Ct Abdomen Pelvis W Contrast  Result Date: 05/24/2016 CLINICAL DATA:  Abdominal pain and diarrhea. Diagnosed with Clostridium difficile yesterday. EXAM: CT ABDOMEN AND PELVIS WITH CONTRAST TECHNIQUE: Multidetector CT imaging of the abdomen and pelvis was performed using the standard protocol following bolus administration of intravenous contrast. CONTRAST:  171m ISOVUE-300 IOPAMIDOL (ISOVUE-300) INJECTION 61% COMPARISON:  Acute abdominal series April 17, 2016 and CT abdomen and pelvis March 18, 2016 FINDINGS: LOWER CHEST: Lung bases are clear. Included heart size is normal. No pericardial effusion. HEPATOBILIARY: Liver and gallbladder are normal. PANCREAS: Normal. SPLEEN: Normal. ADRENALS/URINARY TRACT: Kidneys are orthotopic, demonstrating symmetric enhancement. No nephrolithiasis, hydronephrosis or solid renal masses. The unopacified ureters are normal in course and caliber. Urinary bladder is partially distended and unremarkable. Normal adrenal glands. STOMACH/BOWEL: Diffuse colonic wall thickening from the  cecum, moderate within the sigmoid colon with pericolonic inflammation. The stomach, small bowel are normal in course and caliber without inflammatory changes. Normal appendix. VASCULAR/LYMPHATIC: Aortoiliac vessels are normal in course and caliber. No lymphadenopathy by CT size criteria. Small scattered mesenteric lymph nodes. REPRODUCTIVE: Normal. OTHER: No intraperitoneal free fluid or free air. MUSCULOSKELETAL: Nonacute. Focally irregular sclerosis bilateral femoral heads without collapse. Scattered Schmorl's nodes. IMPRESSION: Diffuse colitis compatible with history of Clostridium difficile, no immediate complication. 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 Electronically Signed   By: CElon AlasM.D.   On: 05/24/2016 21:49        Scheduled Meds: . atomoxetine  100 mg Oral Daily  . divalproex  500 mg Oral BID  . enoxaparin (LOVENOX) injection  40 mg Subcutaneous Q24H  . loratadine  10 mg Oral QHS  . montelukast  10 mg Oral QHS  . [START ON 05/26/2016] pneumococcal 23 valent vaccine  0.5 mL Intramuscular Tomorrow-1000  . potassium chloride  40 mEq Oral Once  . sertraline  100 mg Oral Daily  . traZODone  100 mg Oral QHS  . vancomycin  250 mg Oral Q6H   Continuous Infusions: . sodium chloride 125 mL/hr at 05/25/16 1549     LOS: 0 days    Time spent:263ms    MEMON,JEHANZEB, MD [facility] Pager [phone] 7PM-7AM, please contact night-coverage [email or web address] Password [email or web address]/17/2017, 6:43 PM

## 2016-05-26 DIAGNOSIS — F319 Bipolar disorder, unspecified: Secondary | ICD-10-CM

## 2016-05-26 DIAGNOSIS — A0472 Enterocolitis due to Clostridium difficile, not specified as recurrent: Principal | ICD-10-CM

## 2016-05-26 LAB — CBC
HCT: 30.5 % — ABNORMAL LOW (ref 39.0–52.0)
Hemoglobin: 10.2 g/dL — ABNORMAL LOW (ref 13.0–17.0)
MCH: 28.5 pg (ref 26.0–34.0)
MCHC: 33.4 g/dL (ref 30.0–36.0)
MCV: 85.2 fL (ref 78.0–100.0)
Platelets: 338 10*3/uL (ref 150–400)
RBC: 3.58 MIL/uL — ABNORMAL LOW (ref 4.22–5.81)
RDW: 12.8 % (ref 11.5–15.5)
WBC: 7.1 10*3/uL (ref 4.0–10.5)

## 2016-05-26 LAB — BASIC METABOLIC PANEL
Anion gap: 8 (ref 5–15)
BUN: <5 mg/dL — ABNORMAL LOW (ref 6–20)
CO2: 23 mmol/L (ref 22–32)
Calcium: 8.4 mg/dL — ABNORMAL LOW (ref 8.9–10.3)
Chloride: 107 mmol/L (ref 101–111)
Creatinine, Ser: 0.82 mg/dL (ref 0.61–1.24)
GFR calc Af Amer: >60 mL/min (ref >60)
GFR calc non Af Amer: >60 mL/min (ref >60)
Glucose, Bld: 99 mg/dL (ref 65–99)
Potassium: 3.3 mmol/L — ABNORMAL LOW (ref 3.5–5.1)
Sodium: 138 mmol/L (ref 135–145)

## 2016-05-26 MED ORDER — VANCOMYCIN 50 MG/ML ORAL SOLUTION: 250.0000 mg | Freq: Four times a day (QID) | ORAL | 0 refills | Status: DC

## 2016-05-26 MED ORDER — POTASSIUM CHLORIDE CRYS ER 20 MEQ PO TBCR: 20.0000 meq | EXTENDED_RELEASE_TABLET | Freq: Every day | ORAL | 0 refills | Status: DC

## 2016-05-26 NOTE — Discharge Summary (Signed)
Physician Discharge Summary  Wayne Avila XTK:[id number] DOB: 03-06-1991 DOA: 05/24/2016  PCP: [name], MD  Admit date: 05/24/2016 Discharge date: 05/26/2016  Admitted From: Home.  Disposition: Home.   Recommendations for Outpatient Follow-up:  1. Follow up with PCP in 1-2 weeks 2. Follow up with Dr [name] next week.   Home Health: None.  Equipment/Devices: None.  Discharge Condition: better.  Stool forming.  CODE STATUS: FULL CODE.  Diet recommendation: As tolerated.   Brief/Interim Summary:  Patient was re admitted for persisitent diarrhea felt to be due to persistent C diff infection.  He was admitted by Dr Darrick Meigs on May 24, 2016.  As per his H and P:  "  Wayne Avila  is a 25 y.o. male, With history of ulcerative colitis who came to hospital with worsening episodes of diarrhea. Patient was seen in the ED yesterday and was discharged on by mouth vancomycin. Today he was called at home to come to hospital as stool for C. difficile was positive. In the ED CT of the abdomen pelvis was done which showed diffuse colitis consistent with C. Difficile. He denies nausea or vomiting. No chest pain or shortness of breath. No fever Still complains of abdominal pain  HOSPITAL COURSE:  Patient was given IVF, and he was given Vancomycin for his persistent C diff infection.  His stool was sent for other pathogens and they were negative.  He started to have formed stool, and is anxious to go home.  He has been on disability, and lives at home with his mother.  He no longer has abdominal pain, chills, nausea or vomiting.  He will be discharged home on oral Vancomycin for at least another 10d days, and will see Dr [name] in follow up next week.  He was discharged on his home meds, and was given K supplement at 20 mEq per day.   Thank you for allowing me to participate in his care.  Good Day.   Discharge Diagnoses:  Active Problems:   Ulcerative colitis (HCC)   C. difficile colitis   Bipolar I  disorder ([place])   Clostridium difficile colitis    Discharge Instructions  Discharge Instructions    Diet - low sodium heart healthy    Complete by:  As directed    Increase activity slowly    Complete by:  As directed      Allergies as of 05/26/2016      Reactions   Amoxicillin-pot Clavulanate Nausea And Vomiting, Other (See Comments)   Has patient had a PCN reaction causing immediate rash, facial/tongue/throat swelling, SOB or lightheadedness with hypotension: No Has patient had a PCN reaction causing severe rash involving mucus membranes or skin necrosis: No Has patient had a PCN reaction that required hospitalization No Has patient had a PCN reaction occurring within the last 10 years: No If all of the above answers are "NO", then may proceed with Cephalosporin use.   Ibuprofen Other (See Comments)   Per pts MD he is unable to take this medication.    Omeprazole Nausea And Vomiting   Pineapple Swelling, Other (See Comments)   Reaction:  Lip swelling   Strawberry Extract Swelling, Other (See Comments)   Reaction:  Lip swelling   Tomato Rash      Medication List    STOP taking these medications   calcium carbonate 500 MG chewable tablet Commonly known as:  TUMS   loratadine 10 MG tablet Commonly known as:  CLARITIN  TAKE these medications   albuterol (2.5 MG/3ML) 0.083% nebulizer solution Commonly known as:  PROVENTIL Inhale 2.5 mg into the lungs every 6 (six) hours as needed for wheezing or shortness of breath.   albuterol 108 (90 Base) MCG/ACT inhaler Commonly known as:  PROVENTIL HFA;VENTOLIN HFA Inhale 2 puffs into the lungs every 4 (four) hours as needed for wheezing or shortness of breath (and/or cough).   atomoxetine 100 MG capsule Commonly known as:  STRATTERA Take 100 mg by mouth daily. Pt takes with a 7m capsule.   cholecalciferol 1000 units tablet Commonly known as:  VITAMIN D Take 1 tablet (1,000 Units total) by mouth daily. START AFTER  VITAMIN D 50,000 U TABLETS ARE COMPLETE   divalproex 500 MG DR tablet Commonly known as:  DEPAKOTE Take 1 tablet (500 mg total) by mouth 2 (two) times daily.   EPIPEN 2-PAK 0.3 mg/0.3 mL Soaj injection Generic drug:  EPINEPHrine Inject 0.3 mg into the muscle once as needed (for severe allergic reaction).   HYDROcodone-acetaminophen 5-325 MG tablet Commonly known as:  NORCO/VICODIN Take 1-2 tablets by mouth every 4 (four) hours as needed.   montelukast 10 MG tablet Commonly known as:  SINGULAIR Take 1 tablet (10 mg total) by mouth at bedtime.   ondansetron 4 MG tablet Commonly known as:  ZOFRAN 1 PO TID FOR 3 DAYS THEN Q4-6H PRN NAUSEA/VOMTING   potassium chloride SA 20 MEQ tablet Commonly known as:  K-DUR,KLOR-CON Take 1 tablet (20 mEq total) by mouth daily.   sertraline 100 MG tablet Commonly known as:  ZOLOFT Take 1 tablet (100 mg total) by mouth daily.   traZODone 100 MG tablet Commonly known as:  DESYREL Take 100 mg by mouth at bedtime.   vancomycin 50 mg/mL oral solution Commonly known as:  VANCOCIN Take 5 mLs (250 mg total) by mouth every 6 (six) hours. What changed:  additional instructions   Vitamin D (Ergocalciferol) 50000 units Caps capsule Commonly known as:  DRISDOL Take 1 capsule (50,000 Units total) by mouth every 7 (seven) days.       Allergies  Allergen Reactions  . Amoxicillin-Pot Clavulanate Nausea And Vomiting and Other (See Comments)    Has patient had a PCN reaction causing immediate rash, facial/tongue/throat swelling, SOB or lightheadedness with hypotension: No Has patient had a PCN reaction causing severe rash involving mucus membranes or skin necrosis: No Has patient had a PCN reaction that required hospitalization No Has patient had a PCN reaction occurring within the last 10 years: No If all of the above answers are "NO", then may proceed with Cephalosporin use.  . Ibuprofen Other (See Comments)    Per pts MD he is unable to take this  medication.   . Omeprazole Nausea And Vomiting  . Pineapple Swelling and Other (See Comments)    Reaction:  Lip swelling  . Strawberry Extract Swelling and Other (See Comments)    Reaction:  Lip swelling  . Tomato Rash    Consultations: None.   Procedures/Studies: Dg Chest 2 View  Result Date: 05/19/2016 CLINICAL DATA:  25year old male with chest pain. EXAM: CHEST  2 VIEW COMPARISON:  Chest radiograph dated 04/17/2016 FINDINGS: The heart size and mediastinal contours are within normal limits. Both lungs are clear. The visualized skeletal structures are unremarkable. Large amount of stool noted in the visualized colon. IMPRESSION: No active cardiopulmonary disease. Electronically Signed   By: AAnner CreteM.D.   On: 05/19/2016 01:51   Ct Abdomen Pelvis W Contrast  Result Date: 05/24/2016 CLINICAL DATA:  Abdominal pain and diarrhea. Diagnosed with Clostridium difficile yesterday. EXAM: CT ABDOMEN AND PELVIS WITH CONTRAST TECHNIQUE: Multidetector CT imaging of the abdomen and pelvis was performed using the standard protocol following bolus administration of intravenous contrast. CONTRAST:  165m ISOVUE-300 IOPAMIDOL (ISOVUE-300) INJECTION 61% COMPARISON:  Acute abdominal series April 17, 2016 and CT abdomen and pelvis March 18, 2016 FINDINGS: LOWER CHEST: Lung bases are clear. Included heart size is normal. No pericardial effusion. HEPATOBILIARY: Liver and gallbladder are normal. PANCREAS: Normal. SPLEEN: Normal. ADRENALS/URINARY TRACT: Kidneys are orthotopic, demonstrating symmetric enhancement. No nephrolithiasis, hydronephrosis or solid renal masses. The unopacified ureters are normal in course and caliber. Urinary bladder is partially distended and unremarkable. Normal adrenal glands. STOMACH/BOWEL: Diffuse colonic wall thickening from the cecum, moderate within the sigmoid colon with pericolonic inflammation. The stomach, small bowel are normal in course and caliber without  inflammatory changes. Normal appendix. VASCULAR/LYMPHATIC: Aortoiliac vessels are normal in course and caliber. No lymphadenopathy by CT size criteria. Small scattered mesenteric lymph nodes. REPRODUCTIVE: Normal. OTHER: No intraperitoneal free fluid or free air. MUSCULOSKELETAL: Nonacute. Focally irregular sclerosis bilateral femoral heads without collapse. Scattered Schmorl's nodes. IMPRESSION: Diffuse colitis compatible with history of Clostridium difficile, no immediate complication. 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 Electronically Signed   By: CElon AlasM.D.   On: 05/24/2016 21:49     Subjective:  Feeling better.  Ready to go home.   Discharge Exam: Vitals:   05/25/16 2223 05/26/16 0400  BP: 135/85 135/79  Pulse: 93 95  Resp: 20 18  Temp: 99.6 F (37.6 C) 99.2 F (37.3 C)   Vitals:   05/25/16 0700 05/25/16 1332 05/25/16 2223 05/26/16 0400  BP: (!) 143/79 135/72 135/85 135/79  Pulse: 62 99 93 95  Resp: 20 20 20 18   Temp: 97.9 F (36.6 C) 98.3 F (36.8 C) 99.6 F (37.6 C) 99.2 F (37.3 C)  TempSrc: Oral Oral Oral Oral  SpO2: 98% 99% 98% 98%  Weight:      Height:        General: Pt is alert, awake, not in acute distress Cardiovascular: RRR, S1/S2 +, no rubs, no gallops Respiratory: CTA bilaterally, no wheezing, no rhonchi Abdominal: Soft, NT, ND, bowel sounds + Extremities: no edema, no cyanosis    The results of significant diagnostics from this hospitalization (including imaging, microbiology, ancillary and laboratory) are listed below for reference.     Microbiology: Recent Results (from the past 240 hour(s))  C difficile quick scan w PCR reflex     Status: Abnormal   Collection Time: 05/23/16  9:40 AM  Result Value Ref Range Status   C Diff antigen POSITIVE (A) NEGATIVE Final   C Diff toxin NEGATIVE NEGATIVE Final   C Diff interpretation Results are indeterminate. See PCR results.  Final  Clostridium Difficile by PCR      Status: Abnormal   Collection Time: 05/23/16  9:40 AM  Result Value Ref Range Status   Toxigenic C Difficile by pcr POSITIVE (A) NEGATIVE Final    Comment: Positive for toxigenic C. difficile with little to no toxin production. Only treat if clinical presentation suggests symptomatic illness. [facility]  Gastrointestinal Panel by PCR , Stool     Status: None   Collection Time: 05/23/16  9:41 AM  Result Value Ref Range Status   Campylobacter species NOT DETECTED NOT DETECTED Final   Plesimonas shigelloides NOT DETECTED NOT DETECTED Final   Salmonella species  NOT DETECTED NOT DETECTED Final   Yersinia enterocolitica NOT DETECTED NOT DETECTED Final   Vibrio species NOT DETECTED NOT DETECTED Final   Vibrio cholerae NOT DETECTED NOT DETECTED Final   Enteroaggregative E coli (EAEC) NOT DETECTED NOT DETECTED Final   Enteropathogenic E coli (EPEC) NOT DETECTED NOT DETECTED Final   Enterotoxigenic E coli (ETEC) NOT DETECTED NOT DETECTED Final   Shiga like toxin producing E coli (STEC) NOT DETECTED NOT DETECTED Final   Shigella/Enteroinvasive E coli (EIEC) NOT DETECTED NOT DETECTED Final   Cryptosporidium NOT DETECTED NOT DETECTED Final   Cyclospora cayetanensis NOT DETECTED NOT DETECTED Final   Entamoeba histolytica NOT DETECTED NOT DETECTED Final   Giardia lamblia NOT DETECTED NOT DETECTED Final   Adenovirus F40/41 NOT DETECTED NOT DETECTED Final   Astrovirus NOT DETECTED NOT DETECTED Final   Norovirus GI/GII NOT DETECTED NOT DETECTED Final   Rotavirus A NOT DETECTED NOT DETECTED Final   Sapovirus (I, II, IV, and V) NOT DETECTED NOT DETECTED Final     Basic Metabolic Panel:  Recent Labs Lab 05/22/16 1230 05/23/16 0954 05/24/16 1927 05/25/16 0651 05/26/16 0500  NA 136 135 135 140 138  K 4.1 3.6 3.8 3.4* 3.3*  CL 104 101 101 107 107  CO2 23 25 27 23 23   GLUCOSE 101* 106* 90 88 99  BUN 9 7 <5* <5* <5*  CREATININE 0.93 0.92 0.85 0.84 0.82  CALCIUM 9.3 8.6*  9.0 8.3* 8.4*   Liver Function Tests:  Recent Labs Lab 05/22/16 1230 05/23/16 0954 05/24/16 1927 05/25/16 0651  AST 16 11* 11* 10*  ALT 12* 10* 9* 8*  ALKPHOS 56 46 51 44  BILITOT 0.5 0.2* 0.3 0.3  PROT 7.2 6.7 6.9 5.8*  ALBUMIN 3.5 3.1* 3.0* 2.6*    Recent Labs Lab 05/23/16 0954 05/24/16 1927  LIPASE 18 13   No results for input(s): AMMONIA in the last 168 hours. CBC:  Recent Labs Lab 05/22/16 1230 05/23/16 0954 05/24/16 1927 05/25/16 0651 05/26/16 0500  WBC 11.6* 9.5 7.8 7.1 7.1  NEUTROABS 6.9  --  4.8  --   --   HGB 12.2* 10.6* 10.6* 9.8* 10.2*  HCT 37.2* 31.4* 32.3* 29.2* 30.5*  MCV 85.5 86.0 86.1 85.4 85.2  PLT 319 249 297 266 338   Urinalysis    Component Value Date/Time   COLORURINE YELLOW 05/23/2016 0940   APPEARANCEUR CLEAR 05/23/2016 0940   LABSPEC 1.021 05/23/2016 0940   PHURINE 5.0 05/23/2016 0940   GLUCOSEU NEGATIVE 05/23/2016 0940   HGBUR NEGATIVE 05/23/2016 0940   BILIRUBINUR NEGATIVE 05/23/2016 0940   KETONESUR NEGATIVE 05/23/2016 0940   PROTEINUR NEGATIVE 05/23/2016 0940   UROBILINOGEN 0.2 09/04/2014 2355   NITRITE NEGATIVE 05/23/2016 0940   LEUKOCYTESUR NEGATIVE 05/23/2016 0940   Microbiology Recent Results (from the past 240 hour(s))  C difficile quick scan w PCR reflex     Status: Abnormal   Collection Time: 05/23/16  9:40 AM  Result Value Ref Range Status   C Diff antigen POSITIVE (A) NEGATIVE Final   C Diff toxin NEGATIVE NEGATIVE Final   C Diff interpretation Results are indeterminate. See PCR results.  Final  Clostridium Difficile by PCR     Status: Abnormal   Collection Time: 05/23/16  9:40 AM  Result Value Ref Range Status   Toxigenic C Difficile by pcr POSITIVE (A) NEGATIVE Final    Comment: Positive for toxigenic C. difficile with little to no toxin production. Only treat if clinical presentation suggests symptomatic  illness. Performed at [facility]   Gastrointestinal Panel by PCR , Stool     Status: None    Collection Time: 05/23/16  9:41 AM  Result Value Ref Range Status   Campylobacter species NOT DETECTED NOT DETECTED Final   Plesimonas shigelloides NOT DETECTED NOT DETECTED Final   Salmonella species NOT DETECTED NOT DETECTED Final   Yersinia enterocolitica NOT DETECTED NOT DETECTED Final   Vibrio species NOT DETECTED NOT DETECTED Final   Vibrio cholerae NOT DETECTED NOT DETECTED Final   Enteroaggregative E coli (EAEC) NOT DETECTED NOT DETECTED Final   Enteropathogenic E coli (EPEC) NOT DETECTED NOT DETECTED Final   Enterotoxigenic E coli (ETEC) NOT DETECTED NOT DETECTED Final   Shiga like toxin producing E coli (STEC) NOT DETECTED NOT DETECTED Final   Shigella/Enteroinvasive E coli (EIEC) NOT DETECTED NOT DETECTED Final   Cryptosporidium NOT DETECTED NOT DETECTED Final   Cyclospora cayetanensis NOT DETECTED NOT DETECTED Final   Entamoeba histolytica NOT DETECTED NOT DETECTED Final   Giardia lamblia NOT DETECTED NOT DETECTED Final   Adenovirus F40/41 NOT DETECTED NOT DETECTED Final   Astrovirus NOT DETECTED NOT DETECTED Final   Norovirus GI/GII NOT DETECTED NOT DETECTED Final   Rotavirus A NOT DETECTED NOT DETECTED Final   Sapovirus (I, II, IV, and V) NOT DETECTED NOT DETECTED Final     Time coordinating discharge: Over 30 minutes  SIGNED:  Orvan Falconer, MD FACP [facility] 05/26/2016, 12:04 PM   If 7PM-7AM, please contact night-coverage [email or web address] Password [email or web address]

## 2016-05-26 NOTE — Progress Notes (Signed)
Pt IV removed, tolerated well.  Reviewed discharge instructions with pt and mother at the bedside.  Answered all questions at this time.

## 2016-05-29 ENCOUNTER — Encounter (HOSPITAL_COMMUNITY): Payer: Self-pay | Admitting: Emergency Medicine

## 2016-05-29 ENCOUNTER — Emergency Department (HOSPITAL_COMMUNITY)
Admission: EM | Admit: 2016-05-29 | Discharge: 2016-05-29 | Disposition: A | Discharge status: Home / Self Care | Payer: Medicaid Other | Attending: Emergency Medicine | Admitting: Emergency Medicine

## 2016-05-29 ENCOUNTER — Telehealth: Payer: Self-pay

## 2016-05-29 DIAGNOSIS — F909 Attention-deficit hyperactivity disorder, unspecified type: Secondary | ICD-10-CM | POA: Diagnosis not present

## 2016-05-29 DIAGNOSIS — K625 Hemorrhage of anus and rectum: Secondary | ICD-10-CM | POA: Diagnosis present

## 2016-05-29 DIAGNOSIS — Z79899 Other long term (current) drug therapy: Secondary | ICD-10-CM | POA: Insufficient documentation

## 2016-05-29 DIAGNOSIS — K469 Unspecified abdominal hernia without obstruction or gangrene: Secondary | ICD-10-CM | POA: Diagnosis not present

## 2016-05-29 DIAGNOSIS — J45909 Unspecified asthma, uncomplicated: Secondary | ICD-10-CM | POA: Insufficient documentation

## 2016-05-29 DIAGNOSIS — Z87891 Personal history of nicotine dependence: Secondary | ICD-10-CM | POA: Diagnosis not present

## 2016-05-29 DIAGNOSIS — K439 Ventral hernia without obstruction or gangrene: Secondary | ICD-10-CM

## 2016-05-29 LAB — CBC WITH DIFFERENTIAL/PLATELET
Basophils Absolute: 0 10*3/uL (ref 0.0–0.1)
Basophils Relative: 0 %
Eosinophils Absolute: 0.4 10*3/uL (ref 0.0–0.7)
Eosinophils Relative: 5 %
HCT: 30.1 % — ABNORMAL LOW (ref 39.0–52.0)
Hemoglobin: 10 g/dL — ABNORMAL LOW (ref 13.0–17.0)
Lymphocytes Relative: 22 %
Lymphs Abs: 1.8 10*3/uL (ref 0.7–4.0)
MCH: 28.2 pg (ref 26.0–34.0)
MCHC: 33.2 g/dL (ref 30.0–36.0)
MCV: 84.8 fL (ref 78.0–100.0)
Monocytes Absolute: 0.9 10*3/uL (ref 0.1–1.0)
Monocytes Relative: 12 %
Neutro Abs: 4.9 10*3/uL (ref 1.7–7.7)
Neutrophils Relative %: 61 %
Platelets: 336 10*3/uL (ref 150–400)
RBC: 3.55 MIL/uL — ABNORMAL LOW (ref 4.22–5.81)
RDW: 12.8 % (ref 11.5–15.5)
WBC: 7.9 10*3/uL (ref 4.0–10.5)

## 2016-05-29 LAB — BASIC METABOLIC PANEL
Anion gap: 6 (ref 5–15)
BUN: <5 mg/dL — ABNORMAL LOW (ref 6–20)
CO2: 25 mmol/L (ref 22–32)
Calcium: 8.7 mg/dL — ABNORMAL LOW (ref 8.9–10.3)
Chloride: 106 mmol/L (ref 101–111)
Creatinine, Ser: 0.84 mg/dL (ref 0.61–1.24)
GFR calc Af Amer: >60 mL/min (ref >60)
GFR calc non Af Amer: >60 mL/min (ref >60)
Glucose, Bld: 90 mg/dL (ref 65–99)
Potassium: 3.7 mmol/L (ref 3.5–5.1)
Sodium: 137 mmol/L (ref 135–145)

## 2016-05-29 MED ORDER — OXYCODONE-ACETAMINOPHEN 5-325 MG PO TABS: 1.0000 | ORAL_TABLET | ORAL | 0 refills | Status: DC | PRN

## 2016-05-29 MED ORDER — OXYCODONE-ACETAMINOPHEN 5-325 MG PO TABS: 1.0000 | ORAL_TABLET | Freq: Once | ORAL | Status: DC

## 2016-05-29 MED ORDER — OXYCODONE-ACETAMINOPHEN 5-325 MG PO TABS
1.0000 | ORAL_TABLET | Freq: Once | ORAL | Status: AC
  Administered 2016-05-29: 1 via ORAL
  Filled 2016-05-29: qty 1

## 2016-05-29 NOTE — Telephone Encounter (Signed)
Pt's mother called to see what she needed to do because he is having rectal bleeding and sever abd pain (pain level at a 10). He was in the [facility] Saturday and came home on Monday. He has C-Diff and he is on anti-bx for that.

## 2016-05-29 NOTE — ED Notes (Signed)
ED Provider at bedside. 

## 2016-05-29 NOTE — Telephone Encounter (Signed)
Mother is aware to go to the ER

## 2016-05-29 NOTE — Discharge Instructions (Addendum)
Stop taking the hydrocodone, pain reliever.  Use the oxycodone if needed for pain relief.  Talk to your primary care doctor, or gastroenterologist, about your, chronically low, hemoglobin level.

## 2016-05-29 NOTE — ED Provider Notes (Signed)
[facility] DEPT Provider Note   CSN: [id number] Arrival date & time: 05/29/16  1325     History   Chief Complaint Chief Complaint  Patient presents with  . Rectal Bleeding    HPI Wayne Avila is a 25 y.o. male.  He presents for evaluation of persistent abdominal pain now associated with rectal bleeding, several episodes, since yesterday. He typically notices the bleeding prior to an episode of diarrhea. He is currently being treated for C. difficile enterocolitis. He is able to eat some, but is not as hungry as usual. He is not currently vomiting. He denies fever today. He specifies that the abdominal pain, is periumbilical, and intermittent. When seen in the emergency department, he had "mild pain." He has had several episodes of enterocolitis, complicated by C. difficile, within the last 2 months. Today, he called his gastroenterologist who advised him to come here for evaluation, based on the complaint of pain with rectal bleeding. There are no other known modifying factors.  HPI  Past Medical History:  Diagnosis Date  . Abdominal pain, chronic, right upper quadrant   . ADHD (attention deficit hyperactivity disorder)   . Anxiety   . Asthma   . Bipolar 1 disorder ([place])   . Colitis   . Depression   . GERD (gastroesophageal reflux disease)   . Hearing loss   . HOH (hard of hearing)   . Mental developmental delay 11/15/2012  . PONV (postoperative nausea and vomiting)   . Seizures [facility])     Patient Active Problem List   Diagnosis Date Noted  . Clostridium difficile colitis 05/24/2016  . Acute diarrhea 05/22/2016  . Abnormal thyroid function test 03/24/2016  . Acute blood loss anemia 03/23/2016  . C. difficile colitis 03/22/2016  . Hypovolemia 03/22/2016  . Bipolar I disorder ([place]) 03/22/2016  . Ulcerative colitis ([place]) 12/29/2015  . CAP (community acquired pneumonia)   . Aspiration pneumonitis ([place]) 11/21/2015  . Bipolar I disorder, most recent episode depressed  ([place])   . Bipolar 1 disorder, manic, moderate ([place]) 11/09/2014  . Suicidal ideation   . Major depressive disorder, recurrent episode, moderate ([place])   . Oral thrush 02/23/2014  . Mental developmental delay 11/15/2012    Past Surgical History:  Procedure Laterality Date  . ADENOIDECTOMY    . BIOPSY N/A 12/07/2012   Procedure: GASTRIC BIOPSIES;  Surgeon: [name], MD;  Location: [facility] ORS;  Service: Endoscopy;  Laterality: N/A;  . BIOPSY N/A 08/30/2013   Procedure: BIOPSY;  Surgeon: [name], MD;  Location: [facility] ORS;  Service: Endoscopy;  Laterality: N/A;  right colon,transverse colon, left colon, rectal biopsies  . BIOPSY  11/20/2015   Procedure: BIOPSY;  Surgeon: [name], MD;  Location: [facility];  Service: Endoscopy;;  ileal, right colon biopsy, left colon, rectum  . BIOPSY  04/18/2016   Procedure: BIOPSY;  Surgeon: [name], MD;  Location: [facility];  Service: Endoscopy;;  left descending colon biopsies  . COLONOSCOPY  11/20/2015   Dr. [name]: Severe erythema, edema, ulcers from the anal verge to 20 cm above the anal verge without mucosal sparing, remaining colon and terminal ileum appeared normal. Biopsies from the rectum revealed fulminant active chronic colitis consistent with IBD. Pathology from terminal ileum revealed intramucosal lymphoid aggregates. Remaining colon random biopsies with inactive chronic colitis  . COLONOSCOPY WITH PROPOFOL N/A 08/30/2013   ATF:TDDUKG mucosa in the terminal ileum/COLITIS/ MILD PROCTITIS. Biopsies showed patchy chronic active colitis of the right colon  and rectum, overall findings most consistent with idiopathic inflammatory bowel disease.  Marland Kitchen COLONOSCOPY WITH PROPOFOL N/A 11/20/2015   Dr. 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 ESOPHAGOGASTRODUODENOSCOPY  11/20/2015   Dr. [name]: Normal exam, stomach biopsied and duodenal biopsy.. Benign biopsies.  . ESOPHAGOGASTRODUODENOSCOPY (EGD) WITH  PROPOFOL N/A 12/07/2012   SLF:The mucosa of the esophagus appeared normal Non-erosive gastritis (inflammation) was found in the gastric antrum; multiple biopsies The duodenal mucosa showed no abnormalities in the bulb and second portion of the duodenum  . ESOPHAGOGASTRODUODENOSCOPY (EGD) WITH PROPOFOL N/A 11/20/2015   Dr. [name]: normal with normal biopsies, negative H.pylori   . ESOPHAGOGASTRODUODENOSCOPY (EGD) WITH PROPOFOL N/A 04/18/2016   Procedure: ESOPHAGOGASTRODUODENOSCOPY (EGD) WITH PROPOFOL;  Surgeon: [name], MD;  [facility];  Service: Endoscopy;  Laterality: N/A;  . FLEXIBLE SIGMOIDOSCOPY N/A 04/18/2016   Procedure: FLEXIBLE SIGMOIDOSCOPY;  Surgeon: [name], MD;  Location: [facility];  Service: Endoscopy;  Laterality: N/A;  . TONSILLECTOMY         Home Medications    Prior to Admission medications   Medication Sig Start Date End Date Taking? Authorizing Provider  albuterol (PROVENTIL HFA;VENTOLIN HFA) 108 (90 Base) MCG/ACT inhaler Inhale 2 puffs into the lungs every 4 (four) hours as needed for wheezing or shortness of breath (and/or cough).   Yes Historical Provider, MD  albuterol (PROVENTIL) (2.5 MG/3ML) 0.083% nebulizer solution Inhale 2.5 mg into the lungs every 6 (six) hours as needed for wheezing or shortness of breath.    Yes Historical Provider, MD  atomoxetine (STRATTERA) 100 MG capsule Take 100 mg by mouth daily. Pt takes with a 46m capsule.   Yes Historical Provider, MD  cholecalciferol (VITAMIN D) 1000 units tablet Take 1 tablet (1,000 Units total) by mouth daily. START AFTER VITAMIN D 50,000 U TABLETS ARE COMPLETE 04/08/16  Yes SDanie Binder MD  divalproex (DEPAKOTE) 500 MG DR tablet Take 1 tablet (500 mg total) by mouth 2 (two) times daily. 07/03/15  Yes VPenni Bombard MD  EPINEPHrine (EPIPEN 2-PAK) 0.3 mg/0.3 mL IJ SOAJ injection Inject 0.3 mg into the muscle once as needed (for severe allergic reaction).   Yes Historical Provider, MD   HYDROcodone-acetaminophen (NORCO/VICODIN) 5-325 MG tablet Take 1-2 tablets by mouth every 4 (four) hours as needed. 05/22/16  Yes SDanie Binder MD  montelukast (SINGULAIR) 10 MG tablet Take 1 tablet (10 mg total) by mouth at bedtime. 11/13/14  Yes LNiel Hummer NP  ondansetron (ZOFRAN) 4 MG tablet 1 PO TID FOR 3 DAYS THEN Q4-6H PRN NAUSEA/VOMTING 05/22/16  Yes SDanie Binder MD  potassium chloride SA (K-DUR,KLOR-CON) 20 MEQ tablet Take 1 tablet (20 mEq total) by mouth daily. 05/26/16  Yes POrvan Falconer MD  sertraline (ZOLOFT) 100 MG tablet Take 1 tablet (100 mg total) by mouth daily. 11/13/14  Yes LNiel Hummer NP  traZODone (DESYREL) 100 MG tablet Take 100 mg by mouth at bedtime.   Yes Historical Provider, MD  vancomycin (VANCOCIN) 50 mg/mL oral solution Take 5 mLs (250 mg total) by mouth every 6 (six) hours. 05/26/16  Yes POrvan Falconer MD  Vitamin D, Ergocalciferol, (DRISDOL) 50000 units CAPS capsule Take 1 capsule (50,000 Units total) by mouth every 7 (seven) days. 04/08/16  Yes SDanie Binder MD  oxyCODONE-acetaminophen (PERCOCET) 5-325 MG tablet Take 1 tablet by mouth every 4 (four) hours as needed for severe pain. 05/29/16   EDaleen Bo MD  Family History Family History  Problem Relation Age of Onset  . Asthma Mother   . Ulcers Mother   . Bipolar disorder Mother   . ADD / ADHD Father   . Diabetes Maternal Grandmother   . Diabetes Maternal Grandfather   . Colon cancer Neg Hx   . Liver disease Neg Hx     Social History Social History  Substance Use Topics  . Smoking status: Former Smoker    Packs/day: 0.00    Years: 2.00    Quit date: 10/06/2012  . Smokeless tobacco: Never Used     Comment: Never smoked cigarettes  . Alcohol use No     Comment: denies usage     Allergies   Amoxicillin-pot clavulanate; Ibuprofen; Omeprazole; Pineapple; Strawberry extract; and Tomato   Review of Systems Review of Systems  All other systems reviewed and are negative.    Physical  Exam Updated Vital Signs BP 141/91   Pulse 87   Temp 98.6 F (37 C) (Oral)   Resp 16   Ht 5' 8"  (1.727 m)   Wt 216 lb (98 kg)   SpO2 100%   BMI 32.84 kg/m   Physical Exam  Constitutional: He is oriented to person, place, and time. He appears well-developed and well-nourished. No distress.  HENT:  Head: Normocephalic and atraumatic.  Right Ear: External ear normal.  Left Ear: External ear normal.  Eyes: Conjunctivae and EOM are normal. Pupils are equal, round, and reactive to light.  Neck: Normal range of motion and phonation normal. Neck supple.  Cardiovascular: Normal rate, regular rhythm and normal heart sounds.   Pulmonary/Chest: Effort normal and breath sounds normal. He exhibits no bony tenderness.  Abdominal: Soft. He exhibits no mass. There is tenderness (Mild mid-abdomen). There is no rebound and no guarding. No hernia.  Musculoskeletal: Normal range of motion.  Neurological: He is alert and oriented to person, place, and time. No cranial nerve deficit or sensory deficit. He exhibits normal muscle tone. Coordination normal.  Skin: Skin is warm, dry and intact.  Psychiatric: He has a normal mood and affect. His behavior is normal. Judgment and thought content normal.  Nursing note and vitals reviewed.    ED Treatments / Results  Labs (all labs ordered are listed, but only abnormal results are displayed) Labs Reviewed  BASIC METABOLIC PANEL - Abnormal; Notable for the following:       Result Value   BUN <5 (*)    Calcium 8.7 (*)    All other components within normal limits  CBC WITH DIFFERENTIAL/PLATELET - Abnormal; Notable for the following:    RBC 3.55 (*)    Hemoglobin 10.0 (*)    HCT 30.1 (*)    All other components within normal limits  POC OCCULT BLOOD, ED    EKG  EKG Interpretation None       Radiology No results found.  Procedures Procedures (including critical care time)  Medications Ordered in ED Medications  oxyCODONE-acetaminophen  (PERCOCET/ROXICET) 5-325 MG per tablet 1 tablet (not administered)  oxyCODONE-acetaminophen (PERCOCET/ROXICET) 5-325 MG per tablet 1 tablet (1 tablet Oral Given 05/29/16 2026)     Initial Impression / Assessment and Plan / ED Course  I have reviewed the triage vital signs and the nursing notes.  Pertinent labs & imaging results that were available during my care of the patient were reviewed by me and considered in my medical decision making (see chart for details).  Clinical Course     Medications  oxyCODONE-acetaminophen (  PERCOCET/ROXICET) 5-325 MG per tablet 1 tablet (not administered)  oxyCODONE-acetaminophen (PERCOCET/ROXICET) 5-325 MG per tablet 1 tablet (1 tablet Oral Given 05/29/16 2026)    Patient Vitals for the past 24 hrs:  BP Temp Temp src Pulse Resp SpO2 Height Weight  05/29/16 1900 141/91 - - 87 16 100 % - -  05/29/16 1342 - - - - - - 5' 8"  (1.727 m) 216 lb (98 kg)  05/29/16 1339 143/99 98.6 F (37 C) Oral 109 19 100 % - -    8:39 PM Reevaluation with update and discussion. After initial assessment and treatment, an updated evaluation reveals He remains comfortable and again states that the abdominal wall mass, has been present for several months. Findings discussed with patient and family member, all questions answered. Jilberto Vanderwall L    Final Clinical Impressions(s) / ED Diagnoses   Final diagnoses:  Abdominal wall hernia    Subacute abdominal pain related to hernia. No evidence for strangulation, or obstruction.  Nursing Notes Reviewed/ Care Coordinated Applicable Imaging Reviewed Interpretation of Laboratory Data incorporated into ED treatment  The patient appears reasonably screened and/or stabilized for discharge and I doubt any other medical condition or other [facility] requiring further screening, evaluation, or treatment in the ED at this time prior to discharge.  Plan: Home Medications- continue; Home Treatments- rest; return here if the recommended  treatment, does not improve the symptoms; Recommended follow up- PCP prn   New Prescriptions New Prescriptions   OXYCODONE-ACETAMINOPHEN (PERCOCET) 5-325 MG TABLET    Take 1 tablet by mouth every 4 (four) hours as needed for severe pain.     Daleen Bo, MD 05/29/16 2040

## 2016-05-29 NOTE — ED Triage Notes (Signed)
Pt returns c/o abdominal pain unrelieved by medication and rectal bleeding that started this am. Pt currently receiving tx for C-Diff.

## 2016-05-29 NOTE — Telephone Encounter (Signed)
PLEASE CALL PT. SHE SHOULD TAKE HIM TO THE ED.

## 2016-06-06 ENCOUNTER — Ambulatory Visit (INDEPENDENT_AMBULATORY_CARE_PROVIDER_SITE_OTHER): Payer: Medicaid Other | Admitting: Gastroenterology

## 2016-06-06 ENCOUNTER — Encounter: Payer: Self-pay | Admitting: Gastroenterology

## 2016-06-06 ENCOUNTER — Other Ambulatory Visit: Payer: Self-pay

## 2016-06-06 VITALS — BP 151/98 | HR 120 | Temp 97.9°F | Ht 67.0 in | Wt 210.2 lb

## 2016-06-06 DIAGNOSIS — A0472 Enterocolitis due to Clostridium difficile, not specified as recurrent: Secondary | ICD-10-CM

## 2016-06-06 DIAGNOSIS — I471 Supraventricular tachycardia: Secondary | ICD-10-CM

## 2016-06-06 DIAGNOSIS — K51811 Other ulcerative colitis with rectal bleeding: Secondary | ICD-10-CM

## 2016-06-06 NOTE — Assessment & Plan Note (Addendum)
Complete Remicade upcoming in January. Await [facility] labs. Rectal bleeding resolved and coincided with diarrhea. Now that diarrhea has resolved (in setting of CDI), rectal bleeding has resolved. Hgb stable. Would consider referral back to [facility] for FMT due to 3 different document occasions of CDI and his high likelihood of recurrence. For now, BMs are soft, diarrhea has resolved, and he actually appears the best I have seen him in quite some time. 4 month follow-up as previously planned when last seen in clinic.   As separate issue: intermittent tachycardia noted with HR in low 100s-120. This is not a new finding; BP 151/98. Asymptomatic, without pain, afebrile, and in no distress. Clinically not dehydrated. No chest pain, shortness of breath. Referral to cardiology for further evaluation.

## 2016-06-06 NOTE — Progress Notes (Signed)
Referring Provider: Nolene Ebbs, MD Primary Care Physician:  [name], MD Primary GI: Dr. [name]   Chief Complaint  Patient presents with  . Abdominal Pain  . C-DIF    HPI:   Wayne Avila is a 25 y.o. male presenting today with a history of UC, on Remicade. Seen in ED recently with rectal bleeding. History of Cdiff (positive PCR Aug 2017, OCt 2017, and May 23, 2016) with 2 more doses of Vancomycin to be completed. Evaluated at [facility] for fecal transplant in Nov 2017 and not felt to be a candidate at that time; however, if recurrent Cdiff could consider. Could also consider pulse dosing of Vanc. BMs "getting back to normal". No diarrhea. More solid stool. No abdominal pain. No further rectal bleeding. Next Remicade injection Jan 8th. No N/V. Appetite good.   Past Medical History:  Diagnosis Date  . Abdominal pain, chronic, right upper quadrant   . ADHD (attention deficit hyperactivity disorder)   . Anxiety   . Asthma   . Bipolar 1 disorder ([place])   . Colitis   . Depression   . GERD (gastroesophageal reflux disease)   . Hearing loss   . HOH (hard of hearing)   . Mental developmental delay 11/15/2012  . PONV (postoperative nausea and vomiting)   . Seizures ([place])     Past Surgical History:  Procedure Laterality Date  . ADENOIDECTOMY    . BIOPSY N/A 12/07/2012   Procedure: GASTRIC BIOPSIES;  Surgeon: [name], MD;  Location: [facility];  Service: Endoscopy;  Laterality: N/A;  . BIOPSY N/A 08/30/2013   Procedure: BIOPSY;  Surgeon: [name], MD;  Location: [facility];  Service: Endoscopy;  Laterality: N/A;  right colon,transverse colon, left colon, rectal biopsies  . BIOPSY  11/20/2015   Procedure: BIOPSY;  Surgeon: [name], MD;  [facility];  Service: Endoscopy;;  ileal, right colon biopsy, left colon, rectum  . BIOPSY  04/18/2016   Procedure: BIOPSY;  Surgeon: [name], MD;  Location: [facility];  Service: Endoscopy;;  left descending  colon biopsies  . COLONOSCOPY  11/20/2015   Dr. [name]: Severe erythema, edema, ulcers from the anal verge to 20 cm above the anal verge without mucosal sparing, remaining colon and terminal ileum appeared normal. Biopsies from the rectum revealed fulminant active chronic colitis consistent with IBD. Pathology from terminal ileum revealed intramucosal lymphoid aggregates. Remaining colon random biopsies with inactive chronic colitis  . COLONOSCOPY WITH PROPOFOL N/A 08/30/2013   SHF:WYOVZC mucosa in the terminal ileum/COLITIS/ MILD PROCTITIS. 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  Marland Kitchen COLONOSCOPY WITH PROPOFOL N/A 11/20/2015   Dr. 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 ESOPHAGOGASTRODUODENOSCOPY  11/20/2015   Dr. [name]: Normal exam, stomach biopsied and duodenal biopsy.. Benign biopsies.  . ESOPHAGOGASTRODUODENOSCOPY (EGD) WITH PROPOFOL N/A 12/07/2012   SLF:The mucosa of the esophagus appeared normal Non-erosive gastritis (inflammation) was found in the gastric antrum; multiple biopsies The duodenal mucosa showed no abnormalities in the bulb and second portion of the duodenum  . ESOPHAGOGASTRODUODENOSCOPY (EGD) WITH PROPOFOL N/A 11/20/2015   Dr. [name]: normal with normal biopsies, negative H.pylori   . ESOPHAGOGASTRODUODENOSCOPY (EGD) WITH PROPOFOL N/A 04/18/2016   Procedure: ESOPHAGOGASTRODUODENOSCOPY (EGD) WITH PROPOFOL;  Surgeon: [name], MD;  [facility];  Service: Endoscopy;  Laterality: N/A;  . FLEXIBLE SIGMOIDOSCOPY  N/A 04/18/2016   Procedure: FLEXIBLE SIGMOIDOSCOPY;  Surgeon: [name], MD;  [facility];  Service: Endoscopy;  Laterality: N/A;  . TONSILLECTOMY      Current Outpatient Prescriptions  Medication Sig Dispense Refill  . albuterol (PROVENTIL HFA;VENTOLIN HFA) 108 (90 Base) MCG/ACT inhaler  Inhale 2 puffs into the lungs every 4 (four) hours as needed for wheezing or shortness of breath (and/or cough).    Marland Kitchen albuterol (PROVENTIL) (2.5 MG/3ML) 0.083% nebulizer solution Inhale 2.5 mg into the lungs every 6 (six) hours as needed for wheezing or shortness of breath.   11  . atomoxetine (STRATTERA) 100 MG capsule Take 100 mg by mouth daily. Pt takes with a 81m capsule.    . cholecalciferol (VITAMIN D) 1000 units tablet Take 1 tablet (1,000 Units total) by mouth daily. START AFTER VITAMIN D 50,000 U TABLETS ARE COMPLETE 30 tablet 11  . divalproex (DEPAKOTE) 500 MG DR tablet Take 1 tablet (500 mg total) by mouth 2 (two) times daily. 60 tablet 2  . EPINEPHrine (EPIPEN 2-PAK) 0.3 mg/0.3 mL IJ SOAJ injection Inject 0.3 mg into the muscle once as needed (for severe allergic reaction).    . montelukast (SINGULAIR) 10 MG tablet Take 1 tablet (10 mg total) by mouth at bedtime. 30 tablet 3  . ondansetron (ZOFRAN) 4 MG tablet 1 PO TID FOR 3 DAYS THEN Q4-6H PRN NAUSEA/VOMTING 45 tablet 1  . oxyCODONE-acetaminophen (PERCOCET) 5-325 MG tablet Take 1 tablet by mouth every 4 (four) hours as needed for severe pain. 20 tablet 0  . potassium chloride SA (K-DUR,KLOR-CON) 20 MEQ tablet Take 1 tablet (20 mEq total) by mouth daily. 30 tablet 0  . sertraline (ZOLOFT) 100 MG tablet Take 1 tablet (100 mg total) by mouth daily. 30 tablet 0  . traZODone (DESYREL) 100 MG tablet Take 100 mg by mouth at bedtime.    . vancomycin (VANCOCIN) 50 mg/mL oral solution Take 5 mLs (250 mg total) by mouth every 6 (six) hours. 300 mL 0  . Vitamin D, Ergocalciferol, (DRISDOL) 50000 units CAPS capsule Take 1 capsule (50,000 Units total) by mouth every 7 (seven) days. 8 capsule 0  . HYDROcodone-acetaminophen (NORCO/VICODIN) 5-325 MG tablet Take 1-2 tablets by mouth every 4 (four) hours as needed. (Patient not taking: Reported on 06/06/2016) 45 tablet 0   No current facility-administered medications for this visit.     Allergies as of  06/06/2016 - Review Complete 06/06/2016  Allergen Reaction Noted  . Amoxicillin-pot clavulanate Nausea And Vomiting and Other (See Comments) 02/28/2011  . Ibuprofen Other (See Comments) 07/03/2015  . Omeprazole Nausea And Vomiting 04/20/2013  . Pineapple Swelling and Other (See Comments) 04/14/2011  . Strawberry extract Swelling and Other (See Comments) 05/08/2012  . Tomato Rash 04/14/2011    Family History  Problem Relation Age of Onset  . Asthma Mother   . Ulcers Mother   . Bipolar disorder Mother   . ADD / ADHD Father   . Diabetes Maternal Grandmother   . Diabetes Maternal Grandfather   . Colon cancer Neg Hx   . Liver disease Neg Hx     Social History   Social History  . Marital status: Single    Spouse name: N/A  . Number of children: 0  . Years of education: 12th   Occupational History  .  Not Employed    not working   Social History Main Topics  . Smoking status: Former Smoker    Packs/day: 0.00  Years: 2.00    Quit date: 10/06/2012  . Smokeless tobacco: Never Used     Comment: Never smoked cigarettes  . Alcohol use No     Comment: denies usage  . Drug use: No  . Sexual activity: Yes   Other Topics Concern  . None   Social History Narrative   Patient lives at home with girlfriend and family.   Caffeine Use: occasionally    Review of Systems: As mentioned in HPI   Physical Exam: BP (!) 151/98   Pulse (!) 120   Temp 97.9 F (36.6 C) (Oral)   Ht 5' 7"  (1.702 m)   Wt 210 lb 3.2 oz (95.3 kg)   BMI 32.92 kg/m  General:   Alert and oriented. No distress noted. Flat affect but conversant.  Head:  Normocephalic and atraumatic. Abdomen:  +BS, soft, non-tender and non-distended. No rebound or guarding. No HSM or masses noted. Msk:  Symmetrical without gross deformities. Normal posture. Extremities:  Without edema. Neurologic:  Alert and  oriented x4;  grossly normal neurologically. Psych:  Alert and cooperative. Normal mood and affect.  Lab  Results  Component Value Date   WBC 7.9 05/29/2016   HGB 10.0 (L) 05/29/2016   HCT 30.1 (L) 05/29/2016   MCV 84.8 05/29/2016   PLT 336 05/29/2016

## 2016-06-06 NOTE — Patient Instructions (Signed)
Finish vancomycin.  I have referred you to cardiology due to your heart rate being higher than normal on multiple different occasions. If you have any feelings of your heart beating irregularly, chest pain, shortness of breath, go to the emergency room.

## 2016-06-10 NOTE — Progress Notes (Signed)
cc'ed to pcp °

## 2016-06-11 ENCOUNTER — Encounter (HOSPITAL_COMMUNITY): Payer: Self-pay | Admitting: Emergency Medicine

## 2016-06-11 ENCOUNTER — Emergency Department (HOSPITAL_COMMUNITY)
Admission: EM | Admit: 2016-06-11 | Discharge: 2016-06-12 | Disposition: A | Discharge status: Home / Self Care | Payer: Medicaid Other | Attending: Emergency Medicine | Admitting: Emergency Medicine

## 2016-06-11 ENCOUNTER — Emergency Department (HOSPITAL_COMMUNITY): Payer: Medicaid Other

## 2016-06-11 DIAGNOSIS — F909 Attention-deficit hyperactivity disorder, unspecified type: Secondary | ICD-10-CM | POA: Insufficient documentation

## 2016-06-11 DIAGNOSIS — Z79899 Other long term (current) drug therapy: Secondary | ICD-10-CM | POA: Insufficient documentation

## 2016-06-11 DIAGNOSIS — I951 Orthostatic hypotension: Secondary | ICD-10-CM | POA: Diagnosis not present

## 2016-06-11 DIAGNOSIS — R062 Wheezing: Secondary | ICD-10-CM | POA: Insufficient documentation

## 2016-06-11 DIAGNOSIS — R071 Chest pain on breathing: Secondary | ICD-10-CM | POA: Diagnosis present

## 2016-06-11 DIAGNOSIS — R079 Chest pain, unspecified: Secondary | ICD-10-CM | POA: Diagnosis not present

## 2016-06-11 LAB — CBC WITH DIFFERENTIAL/PLATELET
Basophils Absolute: 0 10*3/uL (ref 0.0–0.1)
Basophils Relative: 0 %
Eosinophils Absolute: 0.1 10*3/uL (ref 0.0–0.7)
Eosinophils Relative: 1 %
HCT: 34.1 % — ABNORMAL LOW (ref 39.0–52.0)
Hemoglobin: 11.1 g/dL — ABNORMAL LOW (ref 13.0–17.0)
Lymphocytes Relative: 23 %
Lymphs Abs: 1.9 10*3/uL (ref 0.7–4.0)
MCH: 28.2 pg (ref 26.0–34.0)
MCHC: 32.6 g/dL (ref 30.0–36.0)
MCV: 86.5 fL (ref 78.0–100.0)
Monocytes Absolute: 0.8 10*3/uL (ref 0.1–1.0)
Monocytes Relative: 10 %
Neutro Abs: 5.4 10*3/uL (ref 1.7–7.7)
Neutrophils Relative %: 66 %
Platelets: 429 10*3/uL — ABNORMAL HIGH (ref 150–400)
RBC: 3.94 MIL/uL — ABNORMAL LOW (ref 4.22–5.81)
RDW: 13.8 % (ref 11.5–15.5)
WBC: 8.2 10*3/uL (ref 4.0–10.5)

## 2016-06-11 LAB — COMPREHENSIVE METABOLIC PANEL
ALT: 9 U/L — ABNORMAL LOW (ref 17–63)
AST: 23 U/L (ref 15–41)
Albumin: 3.5 g/dL (ref 3.5–5.0)
Alkaline Phosphatase: 50 U/L (ref 38–126)
Anion gap: 9 (ref 5–15)
BUN: 12 mg/dL (ref 6–20)
CO2: 24 mmol/L (ref 22–32)
Calcium: 9.7 mg/dL (ref 8.9–10.3)
Chloride: 105 mmol/L (ref 101–111)
Creatinine, Ser: 1.02 mg/dL (ref 0.61–1.24)
GFR calc Af Amer: >60 mL/min (ref >60)
GFR calc non Af Amer: >60 mL/min (ref >60)
Glucose, Bld: 87 mg/dL (ref 65–99)
Potassium: 4.4 mmol/L (ref 3.5–5.1)
Sodium: 138 mmol/L (ref 135–145)
Total Bilirubin: 0.4 mg/dL (ref 0.3–1.2)
Total Protein: 8.1 g/dL (ref 6.5–8.1)

## 2016-06-11 MED ORDER — ALBUTEROL SULFATE (2.5 MG/3ML) 0.083% IN NEBU
5.0000 mg | INHALATION_SOLUTION | Freq: Once | RESPIRATORY_TRACT | Status: AC
  Administered 2016-06-11: 5 mg via RESPIRATORY_TRACT
  Filled 2016-06-11: qty 6

## 2016-06-11 MED ORDER — SODIUM CHLORIDE 0.9 % IV BOLUS (SEPSIS)
1000.0000 mL | Freq: Once | INTRAVENOUS | Status: AC
  Administered 2016-06-11: 1000 mL via INTRAVENOUS

## 2016-06-11 MED ORDER — SODIUM CHLORIDE 0.9 % IV BOLUS (SEPSIS)
2000.0000 mL | Freq: Once | INTRAVENOUS | Status: AC
  Administered 2016-06-11: 2000 mL via INTRAVENOUS

## 2016-06-11 NOTE — ED Provider Notes (Signed)
[facility] DEPT Provider Note   CSN: [id number] Arrival date & time: 06/11/16  1536     History   Chief Complaint Chief Complaint  Patient presents with  . Chest Pain    HPI Wayne Avila is a 26 y.o. male.  HPI Patient recently completed course of the glottic for C. difficile. Denies any further diarrhea or blood in stool. Presents with central chest pain worse with deep breathing starting this morning. Has had mild cough without sputum production. States he's had some increased shortness of breath but no wheezing. No new lower extremity swelling or pain. No fever or chills. He also complains of lightheadedness with standing and palpitations. Past Medical History:  Diagnosis Date  . Abdominal pain, chronic, right upper quadrant   . ADHD (attention deficit hyperactivity disorder)   . Anxiety   . Asthma   . Bipolar 1 disorder ([place])   . Colitis   . Depression   . GERD (gastroesophageal reflux disease)   . Hearing loss   . HOH (hard of hearing)   . Mental developmental delay 11/15/2012  . PONV (postoperative nausea and vomiting)   . Seizures [facility])     Patient Active Problem List   Diagnosis Date Noted  . Clostridium difficile colitis 05/24/2016  . Acute diarrhea 05/22/2016  . Abnormal thyroid function test 03/24/2016  . Acute blood loss anemia 03/23/2016  . C. difficile colitis 03/22/2016  . Hypovolemia 03/22/2016  . Bipolar I disorder ([place]) 03/22/2016  . Ulcerative colitis ([place]) 12/29/2015  . CAP (community acquired pneumonia)   . Aspiration pneumonitis ([place]) 11/21/2015  . Bipolar I disorder, most recent episode depressed ([place])   . Bipolar 1 disorder, manic, moderate ([place]) 11/09/2014  . Suicidal ideation   . Major depressive disorder, recurrent episode, moderate ([place])   . Oral thrush 02/23/2014  . Mental developmental delay 11/15/2012    Past Surgical History:  Procedure Laterality Date  . ADENOIDECTOMY    . BIOPSY N/A 12/07/2012   Procedure: GASTRIC  BIOPSIES;  Surgeon: [name], MD;  Location: [facility] ORS;  Service: Endoscopy;  Laterality: N/A;  . BIOPSY N/A 08/30/2013   Procedure: BIOPSY;  Surgeon: [name], MD;  Location: [facility] ORS;  Service: Endoscopy;  Laterality: N/A;  right colon,transverse colon, left colon, rectal biopsies  . BIOPSY  11/20/2015   Procedure: BIOPSY;  Surgeon: [name], MD;  Location: [facility] ENDO SUITE;  Service: Endoscopy;;  ileal, right colon biopsy, left colon, rectum  . BIOPSY  04/18/2016   Procedure: BIOPSY;  Surgeon: Wayne Dolin, MD;  Location: [facility] END[facility] SU[facility];  Service: Endoscopy;;  left descending colon biopsies  . COLONOSCOPY  11/20/2015   Dr. [name]: Severe erythema, edema, ulcers from the anal verge to 20 cm above the anal verge without mucosal sparing, remaining colon and terminal ileum appeared normal. Biopsies from the rectum revealed fulminant active chronic colitis consistent with IBD. Pathology from terminal ileum revealed intramucosal lymphoid aggregates. Remaining colon random biopsies with inactive chronic colitis  . COLONOSCOPY WITH PROPOFOL N/A 08/30/2013   UQJ:FHLKTG mucosa in the terminal ileum/COLITIS/ MILD PROCTITIS. 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  Marland Kitchen COLONOSCOPY WITH PROPOFOL N/A 11/20/2015   Dr. 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 ESOPHAGOGASTRODUODENOSCOPY  11/20/2015   Dr. [name]: Normal exam, stomach biopsied and duodenal biopsy.. Benign biopsies.  . ESOPHAGOGASTRODUODENOSCOPY (EGD) WITH PROPOFOL N/A 12/07/2012  SLF:The mucosa of the esophagus appeared normal Non-erosive gastritis (inflammation) was found in the gastric antrum; multiple biopsies The duodenal mucosa showed no abnormalities in the bulb and second portion of the duodenum  . ESOPHAGOGASTRODUODENOSCOPY (EGD) WITH PROPOFOL N/A 11/20/2015   Dr. [name]: normal with  normal biopsies, negative H.pylori   . ESOPHAGOGASTRODUODENOSCOPY (EGD) WITH PROPOFOL N/A 04/18/2016   Procedure: ESOPHAGOGASTRODUODENOSCOPY (EGD) WITH PROPOFOL;  Surgeon: Wayne Dolin, MD;  Location[facility];  Service: Endoscopy;  Laterality: N/A;  . FLEXIBLE SIGMOIDOSCOPY N/A 04/18/2016   Procedure: FLEXIBLE SIGMOIDOSCOPY;  Surgeon: Wayne Dolin, MD;  Location: [facility];  Service: Endoscopy;  Laterality: N/A;  . TONSILLECTOMY         Home Medications    Prior to Admission medications   Medication Sig Start Date End Date Taking? Authorizing Provider  albuterol (PROVENTIL HFA;VENTOLIN HFA) 108 (90 Base) MCG/ACT inhaler Inhale 2 puffs into the lungs every 4 (four) hours as needed for wheezing or shortness of breath (and/or cough).   Yes Historical Provider, MD  albuterol (PROVENTIL) (2.5 MG/3ML) 0.083% nebulizer solution Inhale 2.5 mg into the lungs every 6 (six) hours as needed for wheezing or shortness of breath.    Yes Historical Provider, MD  atomoxetine (STRATTERA) 100 MG capsule Take 100 mg by mouth daily.    Yes Historical Provider, MD  cholecalciferol (VITAMIN D) 1000 units tablet Take 1 tablet (1,000 Units total) by mouth daily. START AFTER VITAMIN D 50,000 U TABLETS ARE COMPLETE Patient taking differently: Take 1,000 Units by mouth daily.  04/08/16  Yes [name], MD  divalproex (DEPAKOTE) 500 MG DR tablet Take 1 tablet (500 mg total) by mouth 2 (two) times daily. 07/03/15  Yes Penni Bombard, MD  montelukast (SINGULAIR) 10 MG tablet Take 1 tablet (10 mg total) by mouth at bedtime. 11/13/14  Yes [name], NP  ondansetron (ZOFRAN) 4 MG tablet 1 PO TID FOR 3 DAYS THEN Q4-6H PRN NAUSEA/VOMTING Patient taking differently: Take 4 mg by mouth every 8 (eight) hours as needed for nausea or vomiting.  05/22/16  Yes [name], MD  oxyCODONE-acetaminophen (PERCOCET) 5-325 MG tablet Take 1 tablet by mouth every 4 (four) hours as needed for severe pain. 05/29/16   Yes Daleen Bo, MD  potassium chloride SA (K-DUR,KLOR-CON) 20 MEQ tablet Take 1 tablet (20 mEq total) by mouth daily. 05/26/16  Yes Orvan Falconer, MD  promethazine (PHENERGAN) 25 MG tablet Take 25 mg by mouth every 6 (six) hours as needed for nausea or vomiting.   Yes Historical Provider, MD  sertraline (ZOLOFT) 100 MG tablet Take 1 tablet (100 mg total) by mouth daily. 11/13/14  Yes [name], NP  traZODone (DESYREL) 100 MG tablet Take 100 mg by mouth at bedtime.   Yes Historical Provider, MD  EPINEPHrine (EPIPEN 2-PAK) 0.3 mg/0.3 mL IJ SOAJ injection Inject 0.3 mg into the muscle once as needed (for severe allergic reaction).    Historical Provider, MD  HYDROcodone-acetaminophen (NORCO/VICODIN) 5-325 MG tablet Take 1-2 tablets by mouth every 4 (four) hours as needed. Patient not taking: Reported on 06/06/2016 05/22/16   [name], MD  vancomycin (VANCOCIN) 50 mg/mL oral solution Take 5 mLs (250 mg total) by mouth every 6 (six) hours. Patient not taking: Reported on 06/11/2016 05/26/16   Orvan Falconer, MD  Vitamin D, Ergocalciferol, (DRISDOL) 50000 units CAPS capsule Take 1 capsule (50,000 Units total) by mouth every 7 (seven) days. Patient not taking: Reported on 06/11/2016  04/08/16   [name], MD    Family History Family History  Problem Relation Age of Onset  . Asthma Mother   . Ulcers Mother   . Bipolar disorder Mother   . ADD / ADHD Father   . Diabetes Maternal Grandmother   . Diabetes Maternal Grandfather   . Colon cancer Neg Hx   . Liver disease Neg Hx     Social History Social History  Substance Use Topics  . Smoking status: Former Smoker    Packs/day: 0.00    Years: 2.00    Quit date: 10/06/2012  . Smokeless tobacco: Never Used     Comment: Never smoked cigarettes  . Alcohol use No     Comment: denies usage     Allergies   Amoxicillin-pot clavulanate; Ibuprofen; Omeprazole; Pineapple; Strawberry extract; and Tomato   Review of Systems Review of Systems    Constitutional: Positive for fatigue. Negative for chills and fever.  Respiratory: Positive for cough and shortness of breath. Negative for wheezing.   Cardiovascular: Positive for chest pain. Negative for palpitations and leg swelling.  Gastrointestinal: Negative for abdominal pain, blood in stool, constipation, diarrhea, nausea and vomiting.  Genitourinary: Negative for dysuria, flank pain and frequency.  Musculoskeletal: Negative for back pain, myalgias, neck pain and neck stiffness.  Skin: Negative for rash and wound.  Neurological: Positive for dizziness and light-headedness. Negative for syncope, numbness and headaches.  All other systems reviewed and are negative.    Physical Exam Updated Vital Signs BP 117/72   Pulse 97   Temp 98.4 F (36.9 C) (Oral)   Resp 18   Ht 5' 8"  (1.727 m)   Wt 210 lb (95.3 kg)   SpO2 100%   BMI 31.93 kg/m   Physical Exam  Constitutional: He is oriented to person, place, and time. He appears well-developed and well-nourished. No distress.  HENT:  Head: Normocephalic and atraumatic.  Mouth/Throat: Oropharynx is clear and moist.  Eyes: EOM are normal. Pupils are equal, round, and reactive to light.  Neck: Normal range of motion. Neck supple. No JVD present.  Cardiovascular: Normal rate and regular rhythm.  Exam reveals no friction rub.   No murmur heard. Pulmonary/Chest: Effort normal. No respiratory distress. He has wheezes (prolonged expiratory phase and a few scattered end expiratory wheezes). He has no rales. He exhibits tenderness (chest tenderness reproduced with palpation over the sternum).  Abdominal: Soft. Bowel sounds are normal. There is no tenderness. There is no rebound and no guarding.  Musculoskeletal: Normal range of motion. He exhibits no edema or tenderness.  No lower extremity swelling, asymmetry or tenderness. Distal pulses intact.  Neurological: He is alert and oriented to person, place, and time.  Moving all extremities  without deficit. Sensation intact.  Skin: Skin is warm and dry. No rash noted. No erythema.  Psychiatric: He has a normal mood and affect. His behavior is normal.  Nursing note and vitals reviewed.    ED Treatments / Results  Labs (all labs ordered are listed, but only abnormal results are displayed) Labs Reviewed  CBC WITH DIFFERENTIAL/PLATELET - Abnormal; Notable for the following:       Result Value   RBC 3.94 (*)    Hemoglobin 11.1 (*)    HCT 34.1 (*)    Platelets 429 (*)    All other components within normal limits  COMPREHENSIVE METABOLIC PANEL - Abnormal; Notable for the following:    ALT 9 (*)    All other components within  normal limits    EKG  EKG Interpretation None       Radiology Dg Chest 2 View  Result Date: 06/11/2016 CLINICAL DATA:  Dyspnea, mid chest pain and dizziness onset today. EXAM: CHEST  2 VIEW COMPARISON:  05/19/2016 CXR FINDINGS: The heart size and mediastinal contours are within normal limits. Both lungs are clear. The visualized skeletal structures are unremarkable. IMPRESSION: No active cardiopulmonary disease. Electronically Signed   By: Ashley Royalty M.D.   On: 06/11/2016 19:17    Procedures Procedures (including critical care time)  Medications Ordered in ED Medications  albuterol (PROVENTIL) (2.5 MG/3ML) 0.083% nebulizer solution 5 mg (5 mg Nebulization Given 06/11/16 1827)  sodium chloride 0.9 % bolus 1,000 mL (0 mLs Intravenous Stopped 06/11/16 2244)  sodium chloride 0.9 % bolus 2,000 mL (2,000 mLs Intravenous New Bag/Given 06/11/16 2245)     Initial Impression / Assessment and Plan / ED Course  I have reviewed the triage vital signs and the nursing notes.  Pertinent labs & imaging results that were available during my care of the patient were reviewed by me and considered in my medical decision making (see chart for details).  Clinical Course   Patient states he is feeling much better after IV fluids and albuterol treatment. Likely some  degree of dehydration. He has had a inhaler at home. Advised to use inhaler for difficulty breathing and wheezing. Advised to drink plenty of fluids. Final Clinical Impressions(s) / ED Diagnoses   Final diagnoses:  Nonspecific chest pain  Orthostasis  Wheezing    New Prescriptions New Prescriptions   No medications on file     Julianne Rice, MD 06/11/16 2335

## 2016-06-11 NOTE — ED Notes (Signed)
Pt taken to xray 

## 2016-06-11 NOTE — ED Triage Notes (Signed)
Pt finished ABX for CDIFF. Started to have chest pain today. Mid chest associated with SOB and dizzy. Took tylenol at home. No relief

## 2016-06-12 ENCOUNTER — Ambulatory Visit: Payer: Medicaid Other | Admitting: Gastroenterology

## 2016-06-12 NOTE — ED Notes (Signed)
Pt verbalized understanding of discharge instructions. Pt ambulatory to waiting room.

## 2016-06-13 ENCOUNTER — Encounter (HOSPITAL_COMMUNITY): Payer: Self-pay

## 2016-06-16 ENCOUNTER — Encounter (HOSPITAL_COMMUNITY)
Admission: RE | Admit: 2016-06-16 | Discharge: 2016-06-16 | Disposition: A | Discharge status: Home / Self Care | Payer: Medicaid Other | Source: Ambulatory Visit | Attending: Gastroenterology | Admitting: Gastroenterology

## 2016-06-16 DIAGNOSIS — K5190 Ulcerative colitis, unspecified, without complications: Secondary | ICD-10-CM | POA: Diagnosis present

## 2016-06-16 MED ORDER — SODIUM CHLORIDE 0.9 % IV SOLN
INTRAVENOUS | Status: DC
  Administered 2016-06-16: 250 mL via INTRAVENOUS

## 2016-06-16 MED ORDER — SODIUM CHLORIDE 0.9 % IV SOLN
5.0000 mg/kg | Freq: Once | INTRAVENOUS | Status: AC
  Administered 2016-06-16: 500 mg via INTRAVENOUS
  Filled 2016-06-16: qty 10

## 2016-06-19 ENCOUNTER — Emergency Department (HOSPITAL_COMMUNITY)
Admission: EM | Admit: 2016-06-19 | Discharge: 2016-06-19 | Disposition: A | Discharge status: Home / Self Care | Payer: Medicaid Other | Attending: Emergency Medicine | Admitting: Emergency Medicine

## 2016-06-19 ENCOUNTER — Encounter (HOSPITAL_COMMUNITY): Payer: Self-pay

## 2016-06-19 DIAGNOSIS — Z87891 Personal history of nicotine dependence: Secondary | ICD-10-CM | POA: Insufficient documentation

## 2016-06-19 DIAGNOSIS — R519 Headache, unspecified: Secondary | ICD-10-CM

## 2016-06-19 DIAGNOSIS — R51 Headache: Secondary | ICD-10-CM | POA: Insufficient documentation

## 2016-06-19 DIAGNOSIS — J45909 Unspecified asthma, uncomplicated: Secondary | ICD-10-CM | POA: Diagnosis not present

## 2016-06-19 MED ORDER — MOMETASONE FUROATE 50 MCG/ACT NA SUSP: 2.0000 | Freq: Every day | NASAL | 0 refills | Status: DC

## 2016-06-19 MED ORDER — DIPHENHYDRAMINE HCL 25 MG PO CAPS
25.0000 mg | ORAL_CAPSULE | Freq: Once | ORAL | Status: AC
  Administered 2016-06-19: 25 mg via ORAL
  Filled 2016-06-19: qty 1

## 2016-06-19 MED ORDER — PSEUDOEPHEDRINE HCL 60 MG PO TABS: 60.0000 mg | ORAL_TABLET | Freq: Four times a day (QID) | ORAL | 0 refills | Status: DC | PRN

## 2016-06-19 MED ORDER — OXYCODONE-ACETAMINOPHEN 5-325 MG PO TABS
1.0000 | ORAL_TABLET | Freq: Once | ORAL | Status: AC
Start: 2016-06-19 — End: 2016-06-19
  Administered 2016-06-19: 1 via ORAL
  Filled 2016-06-19: qty 1

## 2016-06-19 NOTE — Discharge Instructions (Signed)
Follow up with your doctor for recheck.  °

## 2016-06-19 NOTE — ED Triage Notes (Signed)
Pt reports woke up with headache, sinus congestion, and dizziness.  Reports has been "stopped up" for past several days but denies fever.  Denies body aches.

## 2016-06-23 NOTE — ED Provider Notes (Signed)
[facility] DEPT Provider Note   CSN: [id number] Arrival date & time: 06/19/16  1453     History   Chief Complaint Chief Complaint  Patient presents with  . Headache    HPI Wayne Avila is a 26 y.o. male.  HPI    Wayne Avila is a 26 y.o. male who presents to the Emergency Department complaining of sinus pressure, frontal headache, and nasal congestion for several days.  He describes the headache as throbbing pressure across his forehead and into his temples.  Nothing makes the pain better or worse.  He states the nasal congestion is making it difficult for him to breathe from his nose.  He has tried OTC cold medication without relief.  He denies shortness of breath, cough, fever, chills, neck pain or stiffness    Past Medical History:  Diagnosis Date  . Abdominal pain, chronic, right upper quadrant   . ADHD (attention deficit hyperactivity disorder)   . Anxiety   . Asthma   . Bipolar 1 disorder ([place])   . Colitis   . Depression   . GERD (gastroesophageal reflux disease)   . Hearing loss   . HOH (hard of hearing)   . Mental developmental delay 11/15/2012  . PONV (postoperative nausea and vomiting)   . Seizures [facility])     Patient Active Problem List   Diagnosis Date Noted  . Clostridium difficile colitis 05/24/2016  . Acute diarrhea 05/22/2016  . Abnormal thyroid function test 03/24/2016  . Acute blood loss anemia 03/23/2016  . C. difficile colitis 03/22/2016  . Hypovolemia 03/22/2016  . Bipolar I disorder ([place]) 03/22/2016  . Ulcerative colitis ([place]) 12/29/2015  . CAP (community acquired pneumonia)   . Aspiration pneumonitis ([place]) 11/21/2015  . Bipolar I disorder, most recent episode depressed ([place])   . Bipolar 1 disorder, manic, moderate ([place]) 11/09/2014  . Suicidal ideation   . Major depressive disorder, recurrent episode, moderate ([place])   . Oral thrush 02/23/2014  . Mental developmental delay 11/15/2012    Past Surgical History:  Procedure Laterality  Date  . ADENOIDECTOMY    . BIOPSY N/A 12/07/2012   Procedure: GASTRIC BIOPSIES;  Surgeon: [name], MD;  Location: [facility] ORS;  Service: Endoscopy;  Laterality: N/A;  . BIOPSY N/A 08/30/2013   Procedure: BIOPSY;  Surgeon: [name], MD;  Location: [facility] ORS;  Service: Endoscopy;  Laterality: N/A;  right colon,transverse colon, left colon, rectal biopsies  . BIOPSY  11/20/2015   Procedure: BIOPSY;  Surgeon: [name], MD;  Location: [facility] END[facility] SU[facility];  Service: Endoscopy;;  ileal, right colon biopsy, left colon, rectum  . BIOPSY  04/18/2016   Procedure: BIOPSY;  Surgeon: [name], MD;  Location: [facility];  Service: Endoscopy;;  left descending colon biopsies  . COLONOSCOPY  11/20/2015   Dr. [name]: Severe erythema, edema, ulcers from the anal verge to 20 cm above the anal verge without mucosal sparing, remaining colon and terminal ileum appeared normal. Biopsies from the rectum revealed fulminant active chronic colitis consistent with IBD. Pathology from terminal ileum revealed intramucosal lymphoid aggregates. Remaining colon random biopsies with inactive chronic colitis  . COLONOSCOPY WITH PROPOFOL N/A 08/30/2013   [facility]:YWVPXT mucosa in the terminal ileum/COLITIS/ MILD PROCTITIS. 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  Marland Kitchen COLONOSCOPY WITH PROPOFOL N/A 11/20/2015   Dr. 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  ESOPHAGOGASTRODUODENOSCOPY  11/20/2015   Dr. [name]: Normal exam, stomach biopsied and duodenal biopsy.. Benign biopsies.  . ESOPHAGOGASTRODUODENOSCOPY (EGD) WITH PROPOFOL N/A 12/07/2012   SLF:The mucosa of the esophagus appeared normal Non-erosive gastritis (inflammation) was found in the gastric antrum; multiple biopsies The duodenal mucosa showed no abnormalities in the bulb and second portion of the duodenum  .  ESOPHAGOGASTRODUODENOSCOPY (EGD) WITH PROPOFOL N/A 11/20/2015   Dr. [name]: normal with normal biopsies, negative H.pylori   . ESOPHAGOGASTRODUODENOSCOPY (EGD) WITH PROPOFOL N/A 04/18/2016   Procedure: ESOPHAGOGASTRODUODENOSCOPY (EGD) WITH PROPOFOL;  Surgeon: [name], MD;  [facility];  Service: Endoscopy;  Laterality: N/A;  . FLEXIBLE SIGMOIDOSCOPY N/A 04/18/2016   Procedure: FLEXIBLE SIGMOIDOSCOPY;  Surgeon: [name], MD;  Location: [facility];  Service: Endoscopy;  Laterality: N/A;  . TONSILLECTOMY         Home Medications    Prior to Admission medications   Medication Sig Start Date End Date Taking? Authorizing Provider  albuterol (PROVENTIL HFA;VENTOLIN HFA) 108 (90 Base) MCG/ACT inhaler Inhale 2 puffs into the lungs every 4 (four) hours as needed for wheezing or shortness of breath (and/or cough).    Historical Provider, MD  albuterol (PROVENTIL) (2.5 MG/3ML) 0.083% nebulizer solution Inhale 2.5 mg into the lungs every 6 (six) hours as needed for wheezing or shortness of breath.     Historical Provider, MD  atomoxetine (STRATTERA) 100 MG capsule Take 100 mg by mouth daily.     Historical Provider, MD  cholecalciferol (VITAMIN D) 1000 units tablet Take 1 tablet (1,000 Units total) by mouth daily. START AFTER VITAMIN D 50,000 U TABLETS ARE COMPLETE Patient taking differently: Take 1,000 Units by mouth daily.  04/08/16   [name], MD  divalproex (DEPAKOTE) 500 MG DR tablet Take 1 tablet (500 mg total) by mouth 2 (two) times daily. 07/03/15   Penni Bombard, MD  EPINEPHrine (EPIPEN 2-PAK) 0.3 mg/0.3 mL IJ SOAJ injection Inject 0.3 mg into the muscle once as needed (for severe allergic reaction).    Historical Provider, MD  HYDROcodone-acetaminophen (NORCO/VICODIN) 5-325 MG tablet Take 1-2 tablets by mouth every 4 (four) hours as needed. 05/22/16   [name], MD  mometasone (NASONEX) 50 MCG/ACT nasal spray Place 2 sprays into the nose daily. 06/19/16    Timmia Cogburn, PA-C  montelukast (SINGULAIR) 10 MG tablet Take 1 tablet (10 mg total) by mouth at bedtime. 11/13/14   [name], NP  ondansetron (ZOFRAN) 4 MG tablet 1 PO TID FOR 3 DAYS THEN Q4-6H PRN NAUSEA/VOMTING Patient taking differently: Take 4 mg by mouth every 8 (eight) hours as needed for nausea or vomiting.  05/22/16   [name], MD  oxyCODONE-acetaminophen (PERCOCET) 5-325 MG tablet Take 1 tablet by mouth every 4 (four) hours as needed for severe pain. 05/29/16   Daleen Bo, MD  potassium chloride SA (K-DUR,KLOR-CON) 20 MEQ tablet Take 1 tablet (20 mEq total) by mouth daily. 05/26/16   Orvan Falconer, MD  promethazine (PHENERGAN) 25 MG tablet Take 25 mg by mouth every 6 (six) hours as needed for nausea or vomiting.    Historical Provider, MD  pseudoephedrine (SUDAFED) 60 MG tablet Take 1 tablet (60 mg total) by mouth every 6 (six) hours as needed for congestion. 06/19/16   Armond Cuthrell, PA-C  sertraline (ZOLOFT) 100 MG tablet Take 1 tablet (100 mg total) by mouth daily. 11/13/14   [name], NP  traZODone (DESYREL) 100 MG tablet Take 100 mg  by mouth at bedtime.    Historical Provider, MD  vancomycin (VANCOCIN) 50 mg/mL oral solution Take 5 mLs (250 mg total) by mouth every 6 (six) hours. Patient not taking: Reported on 06/11/2016 05/26/16   Orvan Falconer, MD  Vitamin D, Ergocalciferol, (DRISDOL) 50000 units CAPS capsule Take 1 capsule (50,000 Units total) by mouth every 7 (seven) days. Patient not taking: Reported on 06/11/2016 04/08/16   [name], MD    Family History Family History  Problem Relation Age of Onset  . Asthma Mother   . Ulcers Mother   . Bipolar disorder Mother   . ADD / ADHD Father   . Diabetes Maternal Grandmother   . Diabetes Maternal Grandfather   . Colon cancer Neg Hx   . Liver disease Neg Hx     Social History Social History  Substance Use Topics  . Smoking status: Former Smoker    Packs/day: 0.00    Years: 2.00    Quit date: 10/06/2012  .  Smokeless tobacco: Never Used     Comment: Never smoked cigarettes  . Alcohol use No     Comment: denies usage     Allergies   Amoxicillin-pot clavulanate; Ibuprofen; Omeprazole; Pineapple; Strawberry extract; and Tomato   Review of Systems Review of Systems  Constitutional: Negative for activity change, appetite change, chills and fever.  HENT: Positive for congestion, sinus pain and sinus pressure. Negative for facial swelling, rhinorrhea, sore throat and trouble swallowing.   Eyes: Positive for photophobia. Negative for visual disturbance.  Respiratory: Negative for cough, shortness of breath, wheezing and stridor.   Gastrointestinal: Negative for abdominal pain, nausea and vomiting.  Genitourinary: Negative for dysuria.  Musculoskeletal: Negative for neck pain and neck stiffness.  Skin: Negative.  Negative for rash.  Neurological: Positive for headaches. Negative for dizziness, weakness and numbness.  Hematological: Negative for adenopathy.  Psychiatric/Behavioral: Negative for confusion.  All other systems reviewed and are negative.    Physical Exam Updated Vital Signs BP 137/94   Pulse 99   Temp 98.6 F (37 C) (Oral)   Resp 17   Ht 5' 8"  (1.727 m)   Wt 96.6 kg   SpO2 100%   BMI 32.39 kg/m   Physical Exam  Constitutional: He is oriented to person, place, and time. He appears well-developed and well-nourished. No distress.  HENT:  Head: Normocephalic and atraumatic.  Right Ear: Tympanic membrane and ear canal normal.  Left Ear: Tympanic membrane and ear canal normal.  Nose: Mucosal edema present. Right sinus exhibits maxillary sinus tenderness and frontal sinus tenderness. Left sinus exhibits maxillary sinus tenderness and frontal sinus tenderness.  Mouth/Throat: Uvula is midline, oropharynx is clear and moist and mucous membranes are normal. Tonsils are 0 on the right. Tonsils are 0 on the left. No tonsillar exudate.  Eyes: EOM are normal. Pupils are equal,  round, and reactive to light.  Neck: Normal range of motion and phonation normal. Neck supple. No spinous process tenderness and no muscular tenderness present. No neck rigidity. No Kernig's sign noted.  Cardiovascular: Normal rate, regular rhythm and intact distal pulses.   Pulmonary/Chest: Effort normal and breath sounds normal. No respiratory distress.  Musculoskeletal: Normal range of motion.  Neurological: He is alert and oriented to person, place, and time. He has normal strength. No cranial nerve deficit or sensory deficit. He exhibits normal muscle tone. Coordination and gait normal. GCS eye subscore is 4. GCS verbal subscore is 5. GCS motor subscore is 6.  Reflex Scores:  Tricep reflexes are 2+ on the right side and 2+ on the left side.      Bicep reflexes are 2+ on the right side and 2+ on the left side. Skin: Skin is warm and dry.  Psychiatric: He has a normal mood and affect.  Nursing note and vitals reviewed.    ED Treatments / Results  Labs (all labs ordered are listed, but only abnormal results are displayed) Labs Reviewed - No data to display  EKG  EKG Interpretation None       Radiology No results found.  Procedures Procedures (including critical care time)  Medications Ordered in ED Medications  oxyCODONE-acetaminophen (PERCOCET/ROXICET) 5-325 MG per tablet 1 tablet (1 tablet Oral Given 06/19/16 1719)  diphenhydrAMINE (BENADRYL) capsule 25 mg (25 mg Oral Given 06/19/16 1719)     Initial Impression / Assessment and Plan / ED Course  I have reviewed the triage vital signs and the nursing notes.  Pertinent labs & imaging results that were available during my care of the patient were reviewed by me and considered in my medical decision making (see chart for details).  Clinical Course     Vitals stable.  Pt non-toxic appearing.  No focal neuro deficits, no nuchal rigidity.  Reports feeling better after medications and is requesting d/c.  Appears stable   Ambulates with steady gait.  Sx's likely related to sinusitis.    Final Clinical Impressions(s) / ED Diagnoses   Final diagnoses:  Sinus headache    New Prescriptions Discharge Medication List as of 06/19/2016  5:42 PM    START taking these medications   Details  mometasone (NASONEX) 50 MCG/ACT nasal spray Place 2 sprays into the nose daily., Starting Thu 06/19/2016, Print    pseudoephedrine (SUDAFED) 60 MG tablet Take 1 tablet (60 mg total) by mouth every 6 (six) hours as needed for congestion., Starting Thu 06/19/2016, [place], PA-C 06/23/16 [id number]    Dorie Rank, MD 06/23/16 2158

## 2016-07-14 ENCOUNTER — Encounter: Payer: Self-pay | Admitting: Cardiology

## 2016-07-14 NOTE — Progress Notes (Signed)
Cardiology Office Note  Date: 07/15/2016   ID: Wayne Avila, DOB Aug 04, 1990, MRN [id number]  PCP: [name], MD  Referring provider: Barney Drain. MD Consulting Cardiologist: Rozann Lesches, MD   Chief Complaint  Patient presents with  . Tachycardia    History of Present Illness: Wayne Avila is a 26 y.o. male referred for cardiology consultation by Dr. [name]. He is here today with his mother. Based on chart review patient has been noted to have fast heart rates on visits in the GI clinic, most significantly back in December 2017. Based on discussion with his mother, the patient was dehydrated at that time, dealing with diarrhea in the setting of Clostridium difficile colitis. He states that since then he feels back to baseline. He himself does not report any chest pain or palpitations, has had no lightheadedness or syncope. There is no history of cardiomyopathy or arrhythmia. I did review his most recent tracings.  We went over his medications. He is on Strattera which can certainly contribute to tachycardia. Sudafed is also listed but this is a when necessary medication that he has not been using very often.  Past Medical History:  Diagnosis Date  . ADHD (attention deficit hyperactivity disorder)   . Anxiety   . Asthma   . Bipolar 1 disorder ([place])   . Clostridium difficile colitis   . Depression   . GERD (gastroesophageal reflux disease)   . HOH (hard of hearing)   . Mental developmental delay   . Seizures ([place])     Past Surgical History:  Procedure Laterality Date  . ADENOIDECTOMY    . BIOPSY N/A 12/07/2012   Procedure: GASTRIC BIOPSIES;  Surgeon: [name], MD;  Location: [facility];  Service: Endoscopy;  Laterality: N/A;  . BIOPSY N/A 08/30/2013   Procedure: BIOPSY;  Surgeon: [name], MD;  Location: [facility];  Service: Endoscopy;  Laterality: N/A;  right colon,transverse colon, left colon, rectal biopsies  . BIOPSY  11/20/2015   Procedure: BIOPSY;   Surgeon: [name], MD;  Location[facility];  Service: Endoscopy;;  ileal, right colon biopsy, left colon, rectum  . BIOPSY  04/18/2016   Procedure: BIOPSY;  Surgeon: [name], MD;  [facility];  Service: Endoscopy;;  left descending colon biopsies  . COLONOSCOPY  11/20/2015   Dr. [name]: Severe erythema, edema, ulcers from the anal verge to 20 cm above the anal verge without mucosal sparing, remaining colon and terminal ileum appeared normal. Biopsies from the rectum revealed fulminant active chronic colitis consistent with IBD. Pathology from terminal ileum revealed intramucosal lymphoid aggregates. Remaining colon random biopsies with inactive chronic colitis  . COLONOSCOPY WITH PROPOFOL N/A 08/30/2013   [facility]:ENIDPO mucosa in the terminal ileum/COLITIS/ MILD PROCTITIS. 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  Marland Kitchen COLONOSCOPY WITH PROPOFOL N/A 11/20/2015   Dr. 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 ESOPHAGOGASTRODUODENOSCOPY  11/20/2015   Dr. [name]: Normal exam, stomach biopsied and duodenal biopsy.. Benign biopsies.  . ESOPHAGOGASTRODUODENOSCOPY (EGD) WITH PROPOFOL N/A 12/07/2012   SLF:The mucosa of the esophagus appeared normal Non-erosive gastritis (inflammation) was found in the gastric antrum; multiple biopsies The duodenal mucosa showed no abnormalities in the bulb and second portion of the duodenum  . ESOPHAGOGASTRODUODENOSCOPY (EGD) WITH PROPOFOL N/A 11/20/2015   Dr. [name]: normal with normal biopsies, negative H.pylori   .  ESOPHAGOGASTRODUODENOSCOPY (EGD) WITH PROPOFOL N/A 04/18/2016   Procedure: ESOPHAGOGASTRODUODENOSCOPY (EGD) WITH PROPOFOL;  Surgeon: [name], MD;  [facility];  Service: Endoscopy;  Laterality: N/A;  . FLEXIBLE SIGMOIDOSCOPY N/A 04/18/2016   Procedure: FLEXIBLE SIGMOIDOSCOPY;   Surgeon: [name], MD;  [facility];  Service: Endoscopy;  Laterality: N/A;  . TONSILLECTOMY      Current Outpatient Prescriptions  Medication Sig Dispense Refill  . albuterol (PROVENTIL HFA;VENTOLIN HFA) 108 (90 Base) MCG/ACT inhaler Inhale 2 puffs into the lungs every 4 (four) hours as needed for wheezing or shortness of breath (and/or cough).    Marland Kitchen albuterol (PROVENTIL) (2.5 MG/3ML) 0.083% nebulizer solution Inhale 2.5 mg into the lungs every 6 (six) hours as needed for wheezing or shortness of breath.   11  . atomoxetine (STRATTERA) 100 MG capsule Take 100 mg by mouth daily.     . cholecalciferol (VITAMIN D) 1000 units tablet Take 1 tablet (1,000 Units total) by mouth daily. START AFTER VITAMIN D 50,000 U TABLETS ARE COMPLETE (Patient taking differently: Take 1,000 Units by mouth daily. ) 30 tablet 11  . divalproex (DEPAKOTE) 500 MG DR tablet Take 1 tablet (500 mg total) by mouth 2 (two) times daily. 60 tablet 2  . EPINEPHrine (EPIPEN 2-PAK) 0.3 mg/0.3 mL IJ SOAJ injection Inject 0.3 mg into the muscle once as needed (for severe allergic reaction).    Marland Kitchen loratadine (CLARITIN) 10 MG tablet Take 10 mg by mouth daily.    . mometasone (NASONEX) 50 MCG/ACT nasal spray Place 2 sprays into the nose daily. 17 g 0  . montelukast (SINGULAIR) 10 MG tablet Take 1 tablet (10 mg total) by mouth at bedtime. 30 tablet 3  . ondansetron (ZOFRAN) 4 MG tablet 1 PO TID FOR 3 DAYS THEN Q4-6H PRN NAUSEA/VOMTING (Patient taking differently: Take 4 mg by mouth every 8 (eight) hours as needed for nausea or vomiting. ) 45 tablet 1  . pseudoephedrine (SUDAFED) 60 MG tablet Take 1 tablet (60 mg total) by mouth every 6 (six) hours as needed for congestion. 20 tablet 0  . sertraline (ZOLOFT) 100 MG tablet Take 1 tablet (100 mg total) by mouth daily. 30 tablet 0  . traZODone (DESYREL) 100 MG tablet Take 100 mg by mouth at bedtime.     No current facility-administered medications for this visit.     Allergies:  Amoxicillin-pot clavulanate; Ibuprofen; Omeprazole; Pineapple; Strawberry extract; and Tomato   Social History: The patient  reports that he quit smoking about 3 years ago. He smoked 0.00 packs per day for 2.00 years. He has never used smokeless tobacco. He reports that he does not drink alcohol or use drugs.   Family History: The patient's family history includes ADD / ADHD in his father; Asthma in his mother; Bipolar disorder in his mother; Diabetes in his maternal grandfather and maternal grandmother; Ulcers in his mother.   ROS:  Please see the history of present illness. Otherwise, complete review of systems is positive for none.  All other systems are reviewed and negative.   Physical Exam: VS:  BP 138/90 (BP Location: Right Arm)   Pulse 100   Ht 5' 7"  (1.702 m)   Wt 217 lb (98.4 kg)   SpO2 98%   BMI 33.99 kg/m , BMI Body mass index is 33.99 kg/m.  Wt Readings from Last 3 Encounters:  07/15/16 217 lb (98.4 kg)  06/19/16 213 lb (96.6 kg)  06/16/16 213 lb (96.6 kg)  General: Patient appears comfortable at rest. HEENT: Conjunctiva and lids normal, oropharynx clear. Neck: Supple, no elevated JVP or carotid bruits, no thyromegaly. Lungs: Clear to auscultation, nonlabored breathing at rest. Cardiac: Regular rate and rhythm, no S3 or significant systolic murmur, no pericardial rub. Abdomen: Soft, nontender, bowel sounds present, no guarding or rebound. Extremities: No pitting edema, distal pulses 2+. Skin: Warm and dry. Musculoskeletal: No kyphosis. Neuropsychiatric: Alert and oriented x3, affect grossly appropriate.  ECG: I personally reviewed the tracing from 06/11/2016 which showed sinus tachycardia with nonspecific T-wave changes.  Recent Labwork: 03/24/2016: TSH 2.909 06/11/2016: ALT 9; AST 23; BUN 12; Creatinine, Ser 1.02; Hemoglobin 11.1; Platelets 429; Potassium 4.4; Sodium 138   Other Studies Reviewed Today:  Chest x-ray 06/11/2016: FINDINGS: The heart  size and mediastinal contours are within normal limits. Both lungs are clear. The visualized skeletal structures are unremarkable.  IMPRESSION: No active cardiopulmonary disease.  Assessment and Plan:  1. Intermittent sinus tachycardia. Suspect that this is secondary, likely exacerbated by dehydration in December 2017, and also contributed to by [employer]. He is not bothered by any palpitations and has had no syncope. I reviewed his recent ECGs. We will obtain an echocardiogram to exclude any cardiac structural abnormalities that could be contributing, mainly to exclude cardiomyopathy. If this is reassuring, no further cardiac workup is anticipated at this point.  2. Seizure disorder, patient is on Depakote.  3. History of asthma, infrequent use of MDIs.  Current medicines were reviewed with the patient today.   Orders Placed This Encounter  Procedures  . ECHOCARDIOGRAM COMPLETE    Disposition: Call with test results.  Signed, Satira Sark, MD, [facility] 07/15/2016 10:42 AM    [facility] [place]. [address], [place], [place] [address] Phone: [phone]; Fax: [phone]

## 2016-07-15 ENCOUNTER — Ambulatory Visit (INDEPENDENT_AMBULATORY_CARE_PROVIDER_SITE_OTHER): Payer: Medicaid Other | Admitting: Cardiology

## 2016-07-15 ENCOUNTER — Encounter: Payer: Self-pay | Admitting: Cardiology

## 2016-07-15 VITALS — BP 138/90 | HR 100 | Ht 67.0 in | Wt 217.0 lb

## 2016-07-15 DIAGNOSIS — Z8709 Personal history of other diseases of the respiratory system: Secondary | ICD-10-CM

## 2016-07-15 DIAGNOSIS — R000 Tachycardia, unspecified: Secondary | ICD-10-CM

## 2016-07-15 DIAGNOSIS — G40909 Epilepsy, unspecified, not intractable, without status epilepticus: Secondary | ICD-10-CM

## 2016-07-15 DIAGNOSIS — I429 Cardiomyopathy, unspecified: Secondary | ICD-10-CM | POA: Diagnosis not present

## 2016-07-15 NOTE — Patient Instructions (Signed)
Your physician recommends that you schedule a follow-up appointment in: to be determined after echo,we will call you with results.     Your physician has requested that you have an echocardiogram. Echocardiography is a painless test that uses sound waves to create images of your heart. It provides your doctor with information about the size and shape of your heart and how well your heart's chambers and valves are working. This procedure takes approximately one hour. There are no restrictions for this procedure.        Thank you for choosing [facility] !

## 2016-07-18 ENCOUNTER — Ambulatory Visit (HOSPITAL_COMMUNITY)
Admission: RE | Admit: 2016-07-18 | Discharge: 2016-07-18 | Disposition: A | Discharge status: Home / Self Care | Payer: Medicaid Other | Source: Ambulatory Visit | Attending: Cardiology | Admitting: Cardiology

## 2016-07-18 DIAGNOSIS — I429 Cardiomyopathy, unspecified: Secondary | ICD-10-CM | POA: Diagnosis not present

## 2016-07-18 LAB — ECHOCARDIOGRAM COMPLETE
AO mean calculated velocity dopler: 85.4 cm/s
AV Area VTI index: 1.29 cm2/m2
AV Area VTI: 2.98 cm2
AV Area mean vel: 2.73 cm2
AV Mean grad: 4 mmHg
AV Peak grad: 8 mmHg
AV VEL mean LVOT/AV: 0.79
AV area mean vel ind: 1.25 cm2/m2
AV peak Index: 1.36
AV pk vel: 145 cm/s
AV vel: 2.84
Ao pk vel: 0.86 m/s
E decel time: 156 msec
E/e' ratio: 5.74
FS: 34 % (ref 28–44)
IVS/LV PW RATIO, ED: 0.99
LA ID, A-P, ES: 35 mm
LA diam end sys: 35 mm
LA diam index: 1.6 cm/m2
LA vol A4C: 38.9 ml
LA vol index: 20.1 mL/m2
LA vol: 44.1 mL
LV E/e' medial: 5.74
LV E/e'average: 5.74
LV PW d: 9.02 mm — AB (ref 0.6–1.1)
LV dias vol index: 40 mL/m2
LV dias vol: 87 mL (ref 62–150)
LV e' LATERAL: 15.3 cm/s
LV sys vol index: 17 mL/m2
LV sys vol: 38 mL (ref 21–61)
LVOT SV: 70 mL
LVOT VTI: 20.1 cm
LVOT area: 3.46 cm2
LVOT diameter: 21 mm
LVOT peak VTI: 0.82 cm
LVOT peak grad rest: 6 mmHg
LVOT peak vel: 125 cm/s
Lateral S' vel: 16.2 cm/s
MV Dec: 156
MV Peak grad: 3 mmHg
MV pk A vel: 63.4 m/s
MV pk E vel: 87.8 m/s
Simpson's disk: 57
Stroke v: 49 ml
TAPSE: 21.7 mm
TDI e' lateral: 15.3
TDI e' medial: 9.68
VTI: 24.5 cm
Valve area index: 1.29
Valve area: 2.84 cm2

## 2016-07-18 NOTE — Progress Notes (Signed)
*  PRELIMINARY RESULTS* Echocardiogram 2D Echocardiogram has been performed.  Samuel Germany 07/18/2016, 2:42 PM

## 2016-07-22 ENCOUNTER — Emergency Department (HOSPITAL_COMMUNITY)
Admission: EM | Admit: 2016-07-22 | Discharge: 2016-07-22 | Disposition: A | Discharge status: Home / Self Care | Payer: Medicaid Other | Attending: Emergency Medicine | Admitting: Emergency Medicine

## 2016-07-22 ENCOUNTER — Encounter (HOSPITAL_COMMUNITY): Payer: Self-pay | Admitting: Emergency Medicine

## 2016-07-22 ENCOUNTER — Emergency Department (HOSPITAL_COMMUNITY): Payer: Medicaid Other

## 2016-07-22 DIAGNOSIS — R059 Cough, unspecified: Secondary | ICD-10-CM

## 2016-07-22 DIAGNOSIS — Z87891 Personal history of nicotine dependence: Secondary | ICD-10-CM | POA: Diagnosis not present

## 2016-07-22 DIAGNOSIS — R112 Nausea with vomiting, unspecified: Secondary | ICD-10-CM

## 2016-07-22 DIAGNOSIS — R042 Hemoptysis: Secondary | ICD-10-CM | POA: Insufficient documentation

## 2016-07-22 DIAGNOSIS — R079 Chest pain, unspecified: Secondary | ICD-10-CM

## 2016-07-22 DIAGNOSIS — R0789 Other chest pain: Secondary | ICD-10-CM | POA: Insufficient documentation

## 2016-07-22 DIAGNOSIS — F909 Attention-deficit hyperactivity disorder, unspecified type: Secondary | ICD-10-CM | POA: Diagnosis not present

## 2016-07-22 DIAGNOSIS — J45909 Unspecified asthma, uncomplicated: Secondary | ICD-10-CM | POA: Diagnosis not present

## 2016-07-22 DIAGNOSIS — R05 Cough: Secondary | ICD-10-CM

## 2016-07-22 DIAGNOSIS — Z79899 Other long term (current) drug therapy: Secondary | ICD-10-CM | POA: Insufficient documentation

## 2016-07-22 LAB — CBC
HCT: 32.2 % — ABNORMAL LOW (ref 39.0–52.0)
Hemoglobin: 10.6 g/dL — ABNORMAL LOW (ref 13.0–17.0)
MCH: 25.9 pg — ABNORMAL LOW (ref 26.0–34.0)
MCHC: 32.9 g/dL (ref 30.0–36.0)
MCV: 78.5 fL (ref 78.0–100.0)
Platelets: 295 10*3/uL (ref 150–400)
RBC: 4.1 MIL/uL — ABNORMAL LOW (ref 4.22–5.81)
RDW: 13.2 % (ref 11.5–15.5)
WBC: 5.3 10*3/uL (ref 4.0–10.5)

## 2016-07-22 LAB — URINALYSIS, ROUTINE W REFLEX MICROSCOPIC
Bilirubin Urine: NEGATIVE
Glucose, UA: NEGATIVE mg/dL
Hgb urine dipstick: NEGATIVE
Ketones, ur: NEGATIVE mg/dL
Leukocytes, UA: NEGATIVE
Nitrite: NEGATIVE
Protein, ur: NEGATIVE mg/dL
Specific Gravity, Urine: 1.019 (ref 1.005–1.030)
pH: 6 (ref 5.0–8.0)

## 2016-07-22 LAB — COMPREHENSIVE METABOLIC PANEL
ALT: 15 U/L — ABNORMAL LOW (ref 17–63)
AST: 17 U/L (ref 15–41)
Albumin: 3.6 g/dL (ref 3.5–5.0)
Alkaline Phosphatase: 42 U/L (ref 38–126)
Anion gap: 6 (ref 5–15)
BUN: 5 mg/dL — ABNORMAL LOW (ref 6–20)
CO2: 26 mmol/L (ref 22–32)
Calcium: 8.9 mg/dL (ref 8.9–10.3)
Chloride: 108 mmol/L (ref 101–111)
Creatinine, Ser: 0.78 mg/dL (ref 0.61–1.24)
GFR calc Af Amer: >60 mL/min (ref >60)
GFR calc non Af Amer: >60 mL/min (ref >60)
Glucose, Bld: 96 mg/dL (ref 65–99)
Potassium: 3.8 mmol/L (ref 3.5–5.1)
Sodium: 140 mmol/L (ref 135–145)
Total Bilirubin: 0.4 mg/dL (ref 0.3–1.2)
Total Protein: 7.2 g/dL (ref 6.5–8.1)

## 2016-07-22 LAB — TROPONIN I: Troponin I: <0.03 ng/mL (ref <0.03)

## 2016-07-22 LAB — POC OCCULT BLOOD, ED: Fecal Occult Bld: NEGATIVE

## 2016-07-22 LAB — LIPASE, BLOOD: Lipase: 16 U/L (ref 11–51)

## 2016-07-22 MED ORDER — ONDANSETRON 4 MG PO TBDP
4.0000 mg | ORAL_TABLET | Freq: Once | ORAL | Status: AC
  Administered 2016-07-22: 4 mg via ORAL
  Filled 2016-07-22: qty 1

## 2016-07-22 MED ORDER — GI COCKTAIL ~~LOC~~
30.0000 mL | Freq: Once | ORAL | Status: AC
  Administered 2016-07-22: 30 mL via ORAL
  Filled 2016-07-22: qty 30

## 2016-07-22 MED ORDER — ONDANSETRON HCL 4 MG PO TABS: 4.0000 mg | ORAL_TABLET | Freq: Four times a day (QID) | ORAL | 0 refills | Status: DC | PRN

## 2016-07-22 NOTE — ED Triage Notes (Signed)
Pt reports throwing up blood (blood streaked) and coughing up blood starting this morning and chest pain in center of chest without radiation.  Pt reports emesis x2, denies diarrhea.  Pt denies abd pain. Pt alert and oriented.

## 2016-07-22 NOTE — ED Notes (Signed)
EKG given to Dr. Yelverton 

## 2016-07-22 NOTE — ED Provider Notes (Signed)
[facility] DEPT Provider Note   CSN: [id number] Arrival date & time: 07/22/16  1108     History   Chief Complaint Chief Complaint  Patient presents with  . Hematemesis  . Chest Pain    HPI Wayne Avila is a 26 y.o. male.  HPI  26 year old male who presents with hemoptysis and hematemesis. He has a history of ADHD, anxiety, bipolar disorder, asthma, and developmental delay. He is brought in by his mother who states that he has had congestion and runny nose for a while now, but this morning developed cough that was productive of blood streaks in his clear sputum. He also had 2 episodes of forceful vomiting and they noticed a streak of blood within the vomit. No diarrhea, melena, hematochezia, abdominal pain, fevers or chills. He denies any shortness of breath but has noted some chest tightness since this morning. Pain is not pleuritic in nature. No leg swelling, calf tenderness, recent immobilization. He does not take any blood thinners.  Past Medical History:  Diagnosis Date  . ADHD (attention deficit hyperactivity disorder)   . Anxiety   . Asthma   . Bipolar 1 disorder ([place])   . Clostridium difficile colitis   . Depression   . GERD (gastroesophageal reflux disease)   . HOH (hard of hearing)   . Mental developmental delay   . Seizures [facility])     Patient Active Problem List   Diagnosis Date Noted  . Clostridium difficile colitis 05/24/2016  . Acute diarrhea 05/22/2016  . Abnormal thyroid function test 03/24/2016  . Acute blood loss anemia 03/23/2016  . C. difficile colitis 03/22/2016  . Hypovolemia 03/22/2016  . Bipolar I disorder ([place]) 03/22/2016  . Ulcerative colitis ([place]) 12/29/2015  . CAP (community acquired pneumonia)   . Aspiration pneumonitis ([place]) 11/21/2015  . Bipolar I disorder, most recent episode depressed ([place])   . Bipolar 1 disorder, manic, moderate ([place]) 11/09/2014  . Suicidal ideation   . Major depressive disorder, recurrent episode, moderate ([place])    . Oral thrush 02/23/2014  . Mental developmental delay 11/15/2012    Past Surgical History:  Procedure Laterality Date  . ADENOIDECTOMY    . BIOPSY N/A 12/07/2012   Procedure: GASTRIC BIOPSIES;  Surgeon: [name], MD;  Location: [facility] ORS;  Service: Endoscopy;  Laterality: N/A;  . BIOPSY N/A 08/30/2013   Procedure: BIOPSY;  Surgeon: [name], MD;  Location: [facility] ORS;  Service: Endoscopy;  Laterality: N/A;  right colon,transverse colon, left colon, rectal biopsies  . BIOPSY  11/20/2015   Procedure: BIOPSY;  Surgeon: [name], MD;  Location[facility];  Service: Endoscopy;;  ileal, right colon biopsy, left colon, rectum  . BIOPSY  04/18/2016   Procedure: BIOPSY;  Surgeon: [name], MD;  Location: [facility];  Service: Endoscopy;;  left descending colon biopsies  . COLONOSCOPY  11/20/2015   Dr. [name]: Severe erythema, edema, ulcers from the anal verge to 20 cm above the anal verge without mucosal sparing, remaining colon and terminal ileum appeared normal. Biopsies from the rectum revealed fulminant active chronic colitis consistent with IBD. Pathology from terminal ileum revealed intramucosal lymphoid aggregates. Remaining colon random biopsies with inactive chronic colitis  . COLONOSCOPY WITH PROPOFOL N/A 08/30/2013   ZSW:FUXNAT mucosa in the terminal ileum/COLITIS/ MILD PROCTITIS. 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  Marland Kitchen COLONOSCOPY WITH PROPOFOL N/A 11/20/2015   Dr. 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 ESOPHAGOGASTRODUODENOSCOPY  11/20/2015   Dr. [name]: Normal exam, stomach biopsied and duodenal biopsy.. Benign biopsies.  . ESOPHAGOGASTRODUODENOSCOPY (EGD) WITH PROPOFOL N/A 12/07/2012   SLF:The mucosa of the esophagus appeared normal Non-erosive gastritis (inflammation) was found in the gastric antrum; multiple  biopsies The duodenal mucosa showed no abnormalities in the bulb and second portion of the duodenum  . ESOPHAGOGASTRODUODENOSCOPY (EGD) WITH PROPOFOL N/A 11/20/2015   Dr. [name]: normal with normal biopsies, negative H.pylori   . ESOPHAGOGASTRODUODENOSCOPY (EGD) WITH PROPOFOL N/A 04/18/2016   Procedure: ESOPHAGOGASTRODUODENOSCOPY (EGD) WITH PROPOFOL;  Surgeon: [name], MD;  [facility];  Service: Endoscopy;  Laterality: N/A;  . FLEXIBLE SIGMOIDOSCOPY N/A 04/18/2016   Procedure: FLEXIBLE SIGMOIDOSCOPY;  Surgeon: [name], MD;  [facility];  Service: Endoscopy;  Laterality: N/A;  . TONSILLECTOMY         Home Medications    Prior to Admission medications   Medication Sig Start Date End Date Taking? Authorizing Provider  albuterol (PROVENTIL HFA;VENTOLIN HFA) 108 (90 Base) MCG/ACT inhaler Inhale 2 puffs into the lungs every 4 (four) hours as needed for wheezing or shortness of breath (and/or cough).    Historical Provider, MD  albuterol (PROVENTIL) (2.5 MG/3ML) 0.083% nebulizer solution Inhale 2.5 mg into the lungs every 6 (six) hours as needed for wheezing or shortness of breath.     Historical Provider, MD  atomoxetine (STRATTERA) 100 MG capsule Take 100 mg by mouth daily.     Historical Provider, MD  cholecalciferol (VITAMIN D) 1000 units tablet Take 1 tablet (1,000 Units total) by mouth daily. START AFTER VITAMIN D 50,000 U TABLETS ARE COMPLETE Patient taking differently: Take 1,000 Units by mouth daily.  04/08/16   [name], MD  divalproex (DEPAKOTE) 500 MG DR tablet Take 1 tablet (500 mg total) by mouth 2 (two) times daily. 07/03/15   Penni Bombard, MD  EPINEPHrine (EPIPEN 2-PAK) 0.3 mg/0.3 mL IJ SOAJ injection Inject 0.3 mg into the muscle once as needed (for severe allergic reaction).    Historical Provider, MD  loratadine (CLARITIN) 10 MG tablet Take 10 mg by mouth daily.    Historical Provider, MD  mometasone (NASONEX) 50 MCG/ACT nasal  spray Place 2 sprays into the nose daily. 06/19/16   Tammy Triplett, PA-C  montelukast (SINGULAIR) 10 MG tablet Take 1 tablet (10 mg total) by mouth at bedtime. 11/13/14   [name], NP  ondansetron (ZOFRAN) 4 MG tablet 1 PO TID FOR 3 DAYS THEN Q4-6H PRN NAUSEA/VOMTING Patient taking differently: Take 4 mg by mouth every 8 (eight) hours as needed for nausea or vomiting.  05/22/16   [name], MD  ondansetron (ZOFRAN) 4 MG tablet Take 1 tablet (4 mg total) by mouth every 6 (six) hours as needed for nausea or vomiting. 07/22/16   Forde Dandy, MD  pseudoephedrine (SUDAFED) 60 MG tablet Take 1 tablet (60 mg total) by mouth every 6 (six) hours as needed for congestion. 06/19/16   Tammy Triplett, PA-C  sertraline (ZOLOFT) 100 MG tablet Take 1 tablet (100 mg total) by mouth daily. 11/13/14   [name], NP  traZODone (DESYREL) 100 MG tablet Take 100 mg by mouth at bedtime.    Historical Provider, MD    Family History Family History  Problem Relation Age of Onset  . Asthma Mother   . Ulcers Mother   . Bipolar disorder Mother   .  ADD / ADHD Father   . Diabetes Maternal Grandmother   . Diabetes Maternal Grandfather   . Colon cancer Neg Hx   . Liver disease Neg Hx     Social History Social History  Substance Use Topics  . Smoking status: Former Smoker    Packs/day: 0.00    Years: 2.00    Quit date: 10/06/2012  . Smokeless tobacco: Never Used     Comment: Never smoked cigarettes  . Alcohol use No     Comment: denies usage     Allergies   Amoxicillin-pot clavulanate; Ibuprofen; Omeprazole; Pineapple; Strawberry extract; and Tomato   Review of Systems Review of Systems 10/14 systems reviewed and are negative other than those stated in the HPI   Physical Exam Updated Vital Signs BP 148/87 (BP Location: Left Arm)   Pulse 90   Temp 98.9 F (37.2 C) (Oral)   Resp 18   Ht 5' 8"  (1.727 m)   Wt 216 lb (98 kg)   SpO2 100%   BMI 32.84 kg/m   Physical Exam Physical Exam    Nursing note and vitals reviewed. Constitutional: Well developed, well nourished, non-toxic, and in no acute distress Head: Normocephalic and atraumatic.  Mouth/Throat: Oropharynx is clear and moist.  Neck: Normal range of motion. Neck supple.  Cardiovascular: Normal rate and regular rhythm.   Pulmonary/Chest: Effort normal and breath sounds normal.  Abdominal: Soft. There is no tenderness. There is no rebound and no guarding. rectal exam with normal guaiac negative brown stool. Musculoskeletal: Normal range of motion.  Neurological: Alert, no facial droop, fluent speech, moves all extremities symmetrically Skin: Skin is warm and dry.  Psychiatric: Cooperative   ED Treatments / Results  Labs (all labs ordered are listed, but only abnormal results are displayed) Labs Reviewed  COMPREHENSIVE METABOLIC PANEL - Abnormal; Notable for the following:       Result Value   BUN 5 (*)    ALT 15 (*)    All other components within normal limits  CBC - Abnormal; Notable for the following:    RBC 4.10 (*)    Hemoglobin 10.6 (*)    HCT 32.2 (*)    MCH 25.9 (*)    All other components within normal limits  LIPASE, BLOOD  URINALYSIS, ROUTINE W REFLEX MICROSCOPIC  TROPONIN I  POC OCCULT BLOOD, ED    EKG  EKG Interpretation  Date/Time:  Tuesday July 22 2016 11:31:30 EST Ventricular Rate:  91 PR Interval:  134 QRS Duration: 86 QT Interval:  346 QTC Calculation: 425 R Axis:   14 Text Interpretation:  Normal sinus rhythm Nonspecific T wave abnormality Abnormal ECG No acute changes Confirmed by Rica Heather MD, Messina Kosinski [id number]) on 07/22/2016 4:41:37 PM       Radiology Dg Chest 2 View  Result Date: 07/22/2016 CLINICAL DATA:  Chest pain and hemoptysis this morning. EXAM: CHEST  2 VIEW COMPARISON:  06/11/2014 FINDINGS: The heart size and mediastinal contours are within normal limits. Both lungs are clear. The visualized skeletal structures are unremarkable. IMPRESSION: No active cardiopulmonary  disease. Electronically Signed   By: Lorriane Shire M.D.   On: 07/22/2016 11:56    Procedures Procedures (including critical care time)  Medications Ordered in ED Medications  gi cocktail (Maalox,Lidocaine,Donnatal) (30 mLs Oral Given 07/22/16 1716)  ondansetron (ZOFRAN-ODT) disintegrating tablet 4 mg (4 mg Oral Given 07/22/16 1716)     Initial Impression / Assessment and Plan / ED Course  I have reviewed the triage vital signs  and the nursing notes.  Pertinent labs & imaging results that were available during my care of the patient were reviewed by me and considered in my medical decision making (see chart for details).     26 year old male who presents with cough productive of blood-tinged sputum and nausea and vomiting with streaks of blood starting today. is well-appearing and in no acute distress. Vital signs within normal limits. Cardiopulmonary exam unremarkable and abdomen is soft and benign. Blood work was stable anemia with a hemoglobin of 10.6. He has a normal BUN/creatinine ratio and not suggestive of acute or severe upper GI bleed. He is guaiac-negative on rectal exam. Chest x-ray visualized and shows no acute cardiopulmonary processes including infiltrate, mass, edema, or other acute processes. EKG is reassuring.  Overall presentation seems consistent with that of likely viral illness. Streaky blood in his vomit likely from mild Mallory-Weiss tear. Blood-tinged sputum suggestive of more bronchitis. At this time I'm not concerned about PE. Tolerating PO intake without difficulty. Discuss continued supportive care at home with patient and his mother. Strict return and follow-up instructions reviewed. They expressed understanding of all discharge instructions and felt comfortable with the plan of care.   Final Clinical Impressions(s) / ED Diagnoses   Final diagnoses:  Nonspecific chest pain  Non-intractable vomiting with nausea, unspecified vomiting type  Blood-tinged sputum    Cough    New Prescriptions New Prescriptions   ONDANSETRON (ZOFRAN) 4 MG TABLET    Take 1 tablet (4 mg total) by mouth every 6 (six) hours as needed for nausea or vomiting.     Forde Dandy, MD 07/22/16 [phone]

## 2016-07-22 NOTE — Discharge Instructions (Signed)
Your blood work is reassuring today. Your chest x-ray also is normal.  This is likely a viral infection and it can take a few days to 1-2 weeks to get fully better.  Return without fail for worsening symptoms, including fever, intractable vomiting, difficulty breathing, passing out, worsening bleeding or any other symptoms concerning to you.

## 2016-07-24 ENCOUNTER — Encounter (HOSPITAL_COMMUNITY): Payer: Self-pay | Admitting: Emergency Medicine

## 2016-07-24 ENCOUNTER — Emergency Department (HOSPITAL_COMMUNITY)
Admission: EM | Admit: 2016-07-24 | Discharge: 2016-07-24 | Disposition: A | Discharge status: Home / Self Care | Payer: Medicaid Other | Attending: Emergency Medicine | Admitting: Emergency Medicine

## 2016-07-24 DIAGNOSIS — F909 Attention-deficit hyperactivity disorder, unspecified type: Secondary | ICD-10-CM | POA: Diagnosis not present

## 2016-07-24 DIAGNOSIS — Z87891 Personal history of nicotine dependence: Secondary | ICD-10-CM | POA: Insufficient documentation

## 2016-07-24 DIAGNOSIS — J45909 Unspecified asthma, uncomplicated: Secondary | ICD-10-CM | POA: Insufficient documentation

## 2016-07-24 DIAGNOSIS — L03011 Cellulitis of right finger: Secondary | ICD-10-CM | POA: Diagnosis not present

## 2016-07-24 DIAGNOSIS — M7989 Other specified soft tissue disorders: Secondary | ICD-10-CM | POA: Diagnosis present

## 2016-07-24 DIAGNOSIS — Z79899 Other long term (current) drug therapy: Secondary | ICD-10-CM | POA: Insufficient documentation

## 2016-07-24 MED ORDER — SULFAMETHOXAZOLE-TRIMETHOPRIM 800-160 MG PO TABS
1.0000 | ORAL_TABLET | Freq: Once | ORAL | Status: AC
  Administered 2016-07-24: 1 via ORAL
  Filled 2016-07-24: qty 1

## 2016-07-24 MED ORDER — LIDOCAINE HCL (PF) 2 % IJ SOLN
10.0000 mL | Freq: Once | INTRAMUSCULAR | Status: AC
  Administered 2016-07-24: 10 mL
  Filled 2016-07-24: qty 10

## 2016-07-24 MED ORDER — SULFAMETHOXAZOLE-TRIMETHOPRIM 800-160 MG PO TABS: 1.0000 | ORAL_TABLET | Freq: Two times a day (BID) | ORAL | 0 refills | Status: AC

## 2016-07-24 NOTE — ED Provider Notes (Signed)
[facility] DEPT Provider Note   CSN: [id number] Arrival date & time: 07/24/16  1733     History   Chief Complaint Chief Complaint  Patient presents with  . Finger Injury    HPI Wayne Avila is a 26 y.o. male presenting with Pain and swelling of his right long finger along the cuticle edge which is been there for about a week.  He denies injury to the site.  He has constant throbbing pain which has been worsened over the past day.  He has had no treatments prior to arrival and is found no alleviators for this problem.  The history is provided by the patient.    Past Medical History:  Diagnosis Date  . ADHD (attention deficit hyperactivity disorder)   . Anxiety   . Asthma   . Bipolar 1 disorder ([place])   . Clostridium difficile colitis   . Depression   . GERD (gastroesophageal reflux disease)   . HOH (hard of hearing)   . Mental developmental delay   . Seizures [facility])     Patient Active Problem List   Diagnosis Date Noted  . Clostridium difficile colitis 05/24/2016  . Acute diarrhea 05/22/2016  . Abnormal thyroid function test 03/24/2016  . Acute blood loss anemia 03/23/2016  . C. difficile colitis 03/22/2016  . Hypovolemia 03/22/2016  . Bipolar I disorder ([place]) 03/22/2016  . Ulcerative colitis ([place]) 12/29/2015  . CAP (community acquired pneumonia)   . Aspiration pneumonitis ([place]) 11/21/2015  . Bipolar I disorder, most recent episode depressed ([place])   . Bipolar 1 disorder, manic, moderate ([place]) 11/09/2014  . Suicidal ideation   . Major depressive disorder, recurrent episode, moderate ([place])   . Oral thrush 02/23/2014  . Mental developmental delay 11/15/2012    Past Surgical History:  Procedure Laterality Date  . ADENOIDECTOMY    . BIOPSY N/A 12/07/2012   Procedure: GASTRIC BIOPSIES;  Surgeon: [name], MD;  Location: [facility];  Service: Endoscopy;  Laterality: N/A;  . BIOPSY N/A 08/30/2013   Procedure: BIOPSY;  Surgeon: [name], MD;  Location: [facility]  ORS;  Service: Endoscopy;  Laterality: N/A;  right colon,transverse colon, left colon, rectal biopsies  . BIOPSY  11/20/2015   Procedure: BIOPSY;  Surgeon: [name], MD;  [facility];  Service: Endoscopy;;  ileal, right colon biopsy, left colon, rectum  . BIOPSY  04/18/2016   Procedure: BIOPSY;  Surgeon: [name], MD;  [facility];  Service: Endoscopy;;  left descending colon biopsies  . COLONOSCOPY  11/20/2015   Dr. [name]: Severe erythema, edema, ulcers from the anal verge to 20 cm above the anal verge without mucosal sparing, remaining colon and terminal ileum appeared normal. Biopsies from the rectum revealed fulminant active chronic colitis consistent with IBD. Pathology from terminal ileum revealed intramucosal lymphoid aggregates. Remaining colon random biopsies with inactive chronic colitis  . COLONOSCOPY WITH PROPOFOL N/A 08/30/2013   [id number] mucosa in the terminal ileum/COLITIS/ MILD PROCTITIS. 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  Marland Kitchen COLONOSCOPY WITH PROPOFOL N/A 11/20/2015   Dr. 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 ESOPHAGOGASTRODUODENOSCOPY  11/20/2015   Dr. [name]: Normal exam, stomach biopsied and duodenal biopsy.. Benign biopsies.  . ESOPHAGOGASTRODUODENOSCOPY (EGD) WITH PROPOFOL N/A 12/07/2012   SLF:The mucosa of the esophagus appeared normal Non-erosive gastritis (inflammation) was found in the gastric antrum; multiple biopsies  The duodenal mucosa showed no abnormalities in the bulb and second portion of the duodenum  . ESOPHAGOGASTRODUODENOSCOPY (EGD) WITH PROPOFOL N/A 11/20/2015   Dr. [name]: normal with normal biopsies, negative H.pylori   . ESOPHAGOGASTRODUODENOSCOPY (EGD) WITH PROPOFOL N/A 04/18/2016   Procedure: ESOPHAGOGASTRODUODENOSCOPY (EGD) WITH PROPOFOL;  Surgeon: [name],  MD;  [facility];  Service: Endoscopy;  Laterality: N/A;  . FLEXIBLE SIGMOIDOSCOPY N/A 04/18/2016   Procedure: FLEXIBLE SIGMOIDOSCOPY;  Surgeon: [name], MD;  [facility];  Service: Endoscopy;  Laterality: N/A;  . TONSILLECTOMY         Home Medications    Prior to Admission medications   Medication Sig Start Date End Date Taking? Authorizing Provider  albuterol (PROVENTIL HFA;VENTOLIN HFA) 108 (90 Base) MCG/ACT inhaler Inhale 2 puffs into the lungs every 4 (four) hours as needed for wheezing or shortness of breath (and/or cough).    Historical Provider, MD  albuterol (PROVENTIL) (2.5 MG/3ML) 0.083% nebulizer solution Inhale 2.5 mg into the lungs every 6 (six) hours as needed for wheezing or shortness of breath.     Historical Provider, MD  atomoxetine (STRATTERA) 100 MG capsule Take 100 mg by mouth daily.     Historical Provider, MD  cholecalciferol (VITAMIN D) 1000 units tablet Take 1 tablet (1,000 Units total) by mouth daily. START AFTER VITAMIN D 50,000 U TABLETS ARE COMPLETE Patient taking differently: Take 1,000 Units by mouth daily.  04/08/16   [name], MD  divalproex (DEPAKOTE) 500 MG DR tablet Take 1 tablet (500 mg total) by mouth 2 (two) times daily. 07/03/15   Penni Bombard, MD  EPINEPHrine (EPIPEN 2-PAK) 0.3 mg/0.3 mL IJ SOAJ injection Inject 0.3 mg into the muscle once as needed (for severe allergic reaction).    Historical Provider, MD  loratadine (CLARITIN) 10 MG tablet Take 10 mg by mouth daily.    Historical Provider, MD  mometasone (NASONEX) 50 MCG/ACT nasal spray Place 2 sprays into the nose daily. 06/19/16   Tammy Triplett, PA-C  montelukast (SINGULAIR) 10 MG tablet Take 1 tablet (10 mg total) by mouth at bedtime. 11/13/14   [name], NP  ondansetron (ZOFRAN) 4 MG tablet 1 PO TID FOR 3 DAYS THEN Q4-6H PRN NAUSEA/VOMTING Patient taking differently: Take 4 mg by mouth every 8 (eight) hours as needed for nausea or vomiting.  05/22/16    [name], MD  ondansetron (ZOFRAN) 4 MG tablet Take 1 tablet (4 mg total) by mouth every 6 (six) hours as needed for nausea or vomiting. 07/22/16   Forde Dandy, MD  pseudoephedrine (SUDAFED) 60 MG tablet Take 1 tablet (60 mg total) by mouth every 6 (six) hours as needed for congestion. 06/19/16   Tammy Triplett, PA-C  sertraline (ZOLOFT) 100 MG tablet Take 1 tablet (100 mg total) by mouth daily. 11/13/14   [name], NP  traZODone (DESYREL) 100 MG tablet Take 100 mg by mouth at bedtime.    Historical Provider, MD    Family History Family History  Problem Relation Age of Onset  . Asthma Mother   . Ulcers Mother   . Bipolar disorder Mother   . ADD / ADHD Father   . Diabetes Maternal Grandmother   . Diabetes Maternal Grandfather   . Colon cancer Neg Hx   . Liver disease Neg Hx     Social History Social History  Substance Use Topics  . Smoking status: Former Smoker    Packs/day: 0.00  Years: 2.00    Quit date: 10/06/2012  . Smokeless tobacco: Never Used     Comment: Never smoked cigarettes  . Alcohol use No     Comment: denies usage     Allergies   Amoxicillin-pot clavulanate; Ibuprofen; Omeprazole; Pineapple; Strawberry extract; and Tomato   Review of Systems Review of Systems  Constitutional: Negative for fever.  Musculoskeletal: Positive for arthralgias. Negative for myalgias.  Skin: Positive for color change.  Neurological: Negative for weakness and numbness.     Physical Exam Updated Vital Signs BP 141/74 (BP Location: Left Arm)   Pulse 95   Temp 98.7 F (37.1 C) (Oral)   Resp 20   Ht 5' 8"  (1.727 m)   Wt 98 kg   SpO2 97%   BMI 32.84 kg/m   Physical Exam  Constitutional: He appears well-developed and well-nourished.  HENT:  Head: Atraumatic.  Neck: Normal range of motion.  Cardiovascular:  Pulses equal bilaterally  Musculoskeletal: He exhibits tenderness.       Hands: Neurological: He is alert. He has normal strength. He displays normal  reflexes. No sensory deficit.  Skin: Skin is warm and dry.  Psychiatric: He has a normal mood and affect.     ED Treatments / Results  Labs (all labs ordered are listed, but only abnormal results are displayed) Labs Reviewed - No data to display  EKG  EKG Interpretation None       Radiology No results found.  Procedures Procedures (including critical care time)  INCISION AND DRAINAGE Performed by: Evalee Jefferson Consent: Verbal consent obtained. Risks and benefits: risks, benefits and alternatives were discussed Type: abscess  Body area: right long finger, paronychia  Anesthesia: local infiltration  Incision was made with a scalpel.  Digital block: lidocaine 2% without epinephrine  Anesthetic total: 2 ml  Complexity: simple.  Raised cuticle edge from nail plate.    Drainage: purulent  Drainage amount: moderate  Packing material: none  Patient tolerance: Patient tolerated the procedure well with no immediate complications.     Medications Ordered in ED Medications - No data to display   Initial Impression / Assessment and Plan / ED Course  I have reviewed the triage vital signs and the nursing notes.  Pertinent labs & imaging results that were available during my care of the patient were reviewed by me and considered in my medical decision making (see chart for details).     Bid warm epsom salts.  Bactrim, ibuprofen.  Prn f/u with pcp for persistent or worsened sx.  Dressing applied.  Final Clinical Impressions(s) / ED Diagnoses   Final diagnoses:  None    New Prescriptions Discharge Medication List as of 07/24/2016  8:23 PM    START taking these medications   Details  sulfamethoxazole-trimethoprim (BACTRIM DS,SEPTRA DS) 800-160 MG tablet Take 1 tablet by mouth 2 (two) times daily., Starting Thu 07/24/2016, Until Sun 08/03/2016, Print         Evalee Jefferson, PA-C 07/24/16 2153    Nat Christen, MD 07/25/16 [phone]

## 2016-07-24 NOTE — Discharge Instructions (Signed)
Soak your finger in a warm epsom salt bath for 15 minutes twice daily.  Keep the finger covered at other times.  Finish the antibiotic.  You may take motrin if needed for pain relief.

## 2016-07-24 NOTE — ED Triage Notes (Signed)
Pt c/o right middle finger pain since last Friday.

## 2016-08-04 ENCOUNTER — Encounter (HOSPITAL_COMMUNITY): Payer: Self-pay | Admitting: Emergency Medicine

## 2016-08-04 ENCOUNTER — Emergency Department (HOSPITAL_COMMUNITY)
Admission: EM | Admit: 2016-08-04 | Discharge: 2016-08-04 | Disposition: A | Discharge status: Home / Self Care | Payer: Medicaid Other | Attending: Emergency Medicine | Admitting: Emergency Medicine

## 2016-08-04 DIAGNOSIS — H6001 Abscess of right external ear: Secondary | ICD-10-CM | POA: Diagnosis not present

## 2016-08-04 DIAGNOSIS — Z87891 Personal history of nicotine dependence: Secondary | ICD-10-CM | POA: Diagnosis not present

## 2016-08-04 DIAGNOSIS — F909 Attention-deficit hyperactivity disorder, unspecified type: Secondary | ICD-10-CM | POA: Diagnosis not present

## 2016-08-04 DIAGNOSIS — J45909 Unspecified asthma, uncomplicated: Secondary | ICD-10-CM | POA: Insufficient documentation

## 2016-08-04 DIAGNOSIS — Z79899 Other long term (current) drug therapy: Secondary | ICD-10-CM | POA: Insufficient documentation

## 2016-08-04 DIAGNOSIS — H9201 Otalgia, right ear: Secondary | ICD-10-CM | POA: Diagnosis present

## 2016-08-04 DIAGNOSIS — H6000 Abscess of external ear, unspecified ear: Secondary | ICD-10-CM

## 2016-08-04 MED ORDER — DOUBLE ANTIBIOTIC 500-10000 UNIT/GM EX OINT
TOPICAL_OINTMENT | Freq: Once | CUTANEOUS | Status: AC
  Administered 2016-08-04: 1 via TOPICAL
  Filled 2016-08-04: qty 1

## 2016-08-04 MED ORDER — LIDOCAINE-EPINEPHRINE-TETRACAINE (LET) SOLUTION
3.0000 mL | Freq: Once | NASAL | Status: AC
  Administered 2016-08-04: 3 mL via TOPICAL
  Filled 2016-08-04: qty 3

## 2016-08-04 NOTE — ED Provider Notes (Signed)
[place] DEPT Provider Note   CSN: [id number] Arrival date & time: 08/04/16  1740  By signing my name below, I, Wayne Avila, attest that this documentation has been prepared under the direction and in the presence of Wayne Quill, NP. Electronically Signed: Neta Avila, ED Scribe. 08/04/2016. 5:55 PM.    History   Chief Complaint Chief Complaint  Patient presents with  . Otalgia     The history is provided by the patient. No language interpreter was used.   HPI Comments:  Wayne Avila is a 26 y.o. male who presents to the Emergency Department complaining of constant right ear pain that began today. Pt denies any pain on the outer ear. No alleviating factors noted. Pt denies other associated symptoms.   Past Medical History:  Diagnosis Date  . ADHD (attention deficit hyperactivity disorder)   . Anxiety   . Asthma   . Bipolar 1 disorder ([place])   . Clostridium difficile colitis   . Depression   . GERD (gastroesophageal reflux disease)   . HOH (hard of hearing)   . Mental developmental delay   . Seizures [facility])     Patient Active Problem List   Diagnosis Date Noted  . Clostridium difficile colitis 05/24/2016  . Acute diarrhea 05/22/2016  . Abnormal thyroid function test 03/24/2016  . Acute blood loss anemia 03/23/2016  . C. difficile colitis 03/22/2016  . Hypovolemia 03/22/2016  . Bipolar I disorder ([place]) 03/22/2016  . Ulcerative colitis ([place]) 12/29/2015  . CAP (community acquired pneumonia)   . Aspiration pneumonitis ([place]) 11/21/2015  . Bipolar I disorder, most recent episode depressed ([place])   . Bipolar 1 disorder, manic, moderate ([place]) 11/09/2014  . Suicidal ideation   . Major depressive disorder, recurrent episode, moderate ([place])   . Oral thrush 02/23/2014  . Mental developmental delay 11/15/2012    Past Surgical History:  Procedure Laterality Date  . ADENOIDECTOMY    . BIOPSY N/A 12/07/2012   Procedure: GASTRIC BIOPSIES;  Surgeon: [name], MD;  Location: [facility] OR[facility];  Service: Endoscopy;  Laterality: N/A;  . BIOPSY N/A 08/30/2013   Procedure: BIOPSY;  Surgeon: [name], MD;  Location: [facility] OR[facility];  Service: Endoscopy;  Laterality: N/A;  right colon,transverse colon, left colon, rectal biopsies  . BIOPSY  11/20/2015   Procedure: BIOPSY;  Surgeon: [name], MD;  Location[facility];  Service: Endoscopy;;  ileal, right colon biopsy, left colon, rectum  . BIOPSY  04/18/2016   Procedure: BIOPSY;  Surgeon: [name], MD;  Location: [facility];  Service: Endoscopy;;  left descending colon biopsies  . COLONOSCOPY  11/20/2015   Dr. [name]: Severe erythema, edema, ulcers from the anal verge to 20 cm above the anal verge without mucosal sparing, remaining colon and terminal ileum appeared normal. Biopsies from the rectum revealed fulminant active chronic colitis consistent with IBD. Pathology from terminal ileum revealed intramucosal lymphoid aggregates. Remaining colon random biopsies with inactive chronic colitis  . COLONOSCOPY WITH PROPOFOL N/A 08/30/2013   JQB:HALPFX mucosa in the terminal ileum/COLITIS/ MILD PROCTITIS. 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  Marland Kitchen COLONOSCOPY WITH PROPOFOL N/A 11/20/2015   Dr. 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 ESOPHAGOGASTRODUODENOSCOPY  11/20/2015   Dr. [name]: Normal exam, stomach biopsied and duodenal biopsy.. Benign biopsies.  . ESOPHAGOGASTRODUODENOSCOPY (EGD) WITH PROPOFOL N/A 12/07/2012   SLF:The mucosa  of the esophagus appeared normal Non-erosive gastritis (inflammation) was found in the gastric antrum; multiple biopsies The duodenal mucosa showed no abnormalities in the bulb and second portion of the duodenum  . ESOPHAGOGASTRODUODENOSCOPY (EGD) WITH PROPOFOL N/A 11/20/2015   Dr. [name]: normal with normal biopsies, negative  H.pylori   . ESOPHAGOGASTRODUODENOSCOPY (EGD) WITH PROPOFOL N/A 04/18/2016   Procedure: ESOPHAGOGASTRODUODENOSCOPY (EGD) WITH PROPOFOL;  Surgeon: [name], MD;  [facility];  Service: Endoscopy;  Laterality: N/A;  . FLEXIBLE SIGMOIDOSCOPY N/A 04/18/2016   Procedure: FLEXIBLE SIGMOIDOSCOPY;  Surgeon: [name], MD;  Location[facility];  Service: Endoscopy;  Laterality: N/A;  . TONSILLECTOMY         Home Medications    Prior to Admission medications   Medication Sig Start Date End Date Taking? Authorizing Provider  albuterol (PROVENTIL HFA;VENTOLIN HFA) 108 (90 Base) MCG/ACT inhaler Inhale 2 puffs into the lungs every 4 (four) hours as needed for wheezing or shortness of breath (and/or cough).    Historical Provider, MD  albuterol (PROVENTIL) (2.5 MG/3ML) 0.083% nebulizer solution Inhale 2.5 mg into the lungs every 6 (six) hours as needed for wheezing or shortness of breath.     Historical Provider, MD  atomoxetine (STRATTERA) 100 MG capsule Take 100 mg by mouth daily.     Historical Provider, MD  cholecalciferol (VITAMIN D) 1000 units tablet Take 1 tablet (1,000 Units total) by mouth daily. START AFTER VITAMIN D 50,000 U TABLETS ARE COMPLETE Patient taking differently: Take 1,000 Units by mouth daily.  04/08/16   [name], MD  divalproex (DEPAKOTE) 500 MG DR tablet Take 1 tablet (500 mg total) by mouth 2 (two) times daily. 07/03/15   Penni Bombard, MD  EPINEPHrine (EPIPEN 2-PAK) 0.3 mg/0.3 mL IJ SOAJ injection Inject 0.3 mg into the muscle once as needed (for severe allergic reaction).    Historical Provider, MD  loratadine (CLARITIN) 10 MG tablet Take 10 mg by mouth daily.    Historical Provider, MD  mometasone (NASONEX) 50 MCG/ACT nasal spray Place 2 sprays into the nose daily. 06/19/16   Tammy Triplett, PA-C  montelukast (SINGULAIR) 10 MG tablet Take 1 tablet (10 mg total) by mouth at bedtime. 11/13/14   [name], NP  ondansetron (ZOFRAN) 4 MG tablet  1 PO TID FOR 3 DAYS THEN Q4-6H PRN NAUSEA/VOMTING Patient taking differently: Take 4 mg by mouth every 8 (eight) hours as needed for nausea or vomiting.  05/22/16   [name], MD  ondansetron (ZOFRAN) 4 MG tablet Take 1 tablet (4 mg total) by mouth every 6 (six) hours as needed for nausea or vomiting. 07/22/16   Forde Dandy, MD  pseudoephedrine (SUDAFED) 60 MG tablet Take 1 tablet (60 mg total) by mouth every 6 (six) hours as needed for congestion. 06/19/16   Tammy Triplett, PA-C  sertraline (ZOLOFT) 100 MG tablet Take 1 tablet (100 mg total) by mouth daily. 11/13/14   [name], NP  traZODone (DESYREL) 100 MG tablet Take 100 mg by mouth at bedtime.    Historical Provider, MD    Family History Family History  Problem Relation Age of Onset  . Asthma Mother   . Ulcers Mother   . Bipolar disorder Mother   . ADD / ADHD Father   . Diabetes Maternal Grandmother   . Diabetes Maternal Grandfather   . Colon cancer Neg Hx   . Liver disease Neg Hx     Social History Social History  Substance Use Topics  . Smoking status: Former Smoker    Packs/day: 0.00    Years: 2.00    Quit date: 10/06/2012  . Smokeless tobacco: Never Used     Comment: Never smoked cigarettes  . Alcohol use No     Comment: denies usage     Allergies   Amoxicillin-pot clavulanate; Ibuprofen; Omeprazole; Pineapple; Strawberry extract; and Tomato   Review of Systems Review of Systems  Constitutional: Negative for fever.  HENT: Positive for ear pain.   Neurological: Negative for facial asymmetry.  All other systems reviewed and are negative.    Physical Exam Updated Vital Signs BP 145/90   Pulse 107   Temp 98.7 F (37.1 C) (Oral)   Resp 17   Ht 5' 8"  (1.727 m)   Wt 216 lb (98 kg)   SpO2 100%   BMI 32.84 kg/m   Physical Exam  Constitutional: He appears well-developed and well-nourished. No distress.  HENT:  Head: Normocephalic and atraumatic.  Superficial abscess (furuncle) to the caum conchea  of the right ear.  Eyes: Conjunctivae are normal.  Cardiovascular: Normal rate.   Pulmonary/Chest: Effort normal.  Abdominal: He exhibits no distension.  Neurological: He is alert.  Skin: Skin is warm and dry.  Psychiatric: He has a normal mood and affect.  Nursing note and vitals reviewed.    ED Treatments / Results  DIAGNOSTIC STUDIES:  Oxygen Saturation is 100% on RA, normal by my interpretation.    COORDINATION OF CARE:  5:55 PM  Discussed treatment plan with pt at bedside and pt agreed to plan.   Labs (all labs ordered are listed, but only abnormal results are displayed) Labs Reviewed - No data to display  EKG  EKG Interpretation None       Radiology No results found.  Procedures Procedures (including critical care time) INCISION AND DRAINAGE Performed by: Norman Herrlich Consent: Verbal consent obtained. Risks and benefits: risks, benefits and alternatives were discussed Type: abscess  Body area: ear canal  Anesthesia: topical let  Incision was made with a needle.  Complexity: simple  Drainage: purulent  Drainage amount: scant  Patient tolerance: Patient tolerated the procedure well with no immediate complications.   Medications Ordered in ED Medications  lidocaine-EPINEPHrine-tetracaine (LET) solution (3 mLs Topical Given 08/04/16 1807)  polymixin-bacitracin (POLYSPORIN) ointment (1 application Topical Given 08/04/16 1828)     Initial Impression / Assessment and Plan / ED Course  I have reviewed the triage vital signs and the nursing notes.  Pertinent labs & imaging results that were available during my care of the patient were reviewed by me and considered in my medical decision making (see chart for details).      Patient with furuncle of right ear canal. Incision and drainage performed in the ED today. Supportive care and return precautions discussed.  The patient appears reasonably screened and/or stabilized for discharge and I doubt  any other emergent medical condition requiring further screening, evaluation, or treatment in the ED prior to discharge.  Final Clinical Impressions(s) / ED Diagnoses   Final diagnoses:  Furuncle of ear canal    New Prescriptions New Prescriptions   No medications on file  I personally performed the services described in this documentation, which was scribed in my presence. The recorded information has been reviewed and is accurate.     Wayne Quill, NP 08/04/16 1830    Daleen Bo, MD 08/04/16 [phone]

## 2016-08-04 NOTE — ED Triage Notes (Signed)
r ear pain since today. No pta meds for pain. Nad.

## 2016-08-11 ENCOUNTER — Encounter: Payer: Self-pay | Admitting: Gastroenterology

## 2016-08-11 ENCOUNTER — Encounter (HOSPITAL_COMMUNITY)
Admission: RE | Admit: 2016-08-11 | Discharge: 2016-08-11 | Disposition: A | Discharge status: Home / Self Care | Payer: Medicaid Other | Source: Ambulatory Visit | Attending: Gastroenterology | Admitting: Gastroenterology

## 2016-08-11 DIAGNOSIS — K5190 Ulcerative colitis, unspecified, without complications: Secondary | ICD-10-CM | POA: Insufficient documentation

## 2016-08-11 MED ORDER — SODIUM CHLORIDE 0.9 % IV SOLN
Freq: Once | INTRAVENOUS | Status: AC
  Administered 2016-08-11: 500 mL via INTRAVENOUS

## 2016-08-11 MED ORDER — SODIUM CHLORIDE 0.9 % IV SOLN
5.0000 mg/kg | Freq: Once | INTRAVENOUS | Status: AC
  Administered 2016-08-11: 500 mg via INTRAVENOUS
  Filled 2016-08-11: qty 50

## 2016-08-17 ENCOUNTER — Encounter (HOSPITAL_COMMUNITY): Payer: Self-pay | Admitting: Emergency Medicine

## 2016-08-17 ENCOUNTER — Emergency Department (HOSPITAL_COMMUNITY)
Admission: EM | Admit: 2016-08-17 | Discharge: 2016-08-17 | Disposition: A | Discharge status: Home / Self Care | Payer: Medicaid Other | Attending: Emergency Medicine | Admitting: Emergency Medicine

## 2016-08-17 DIAGNOSIS — J45909 Unspecified asthma, uncomplicated: Secondary | ICD-10-CM | POA: Diagnosis not present

## 2016-08-17 DIAGNOSIS — K029 Dental caries, unspecified: Secondary | ICD-10-CM | POA: Diagnosis not present

## 2016-08-17 DIAGNOSIS — F909 Attention-deficit hyperactivity disorder, unspecified type: Secondary | ICD-10-CM | POA: Diagnosis not present

## 2016-08-17 DIAGNOSIS — K0889 Other specified disorders of teeth and supporting structures: Secondary | ICD-10-CM

## 2016-08-17 DIAGNOSIS — Z79899 Other long term (current) drug therapy: Secondary | ICD-10-CM | POA: Insufficient documentation

## 2016-08-17 DIAGNOSIS — Z87891 Personal history of nicotine dependence: Secondary | ICD-10-CM | POA: Insufficient documentation

## 2016-08-17 MED ORDER — TRAMADOL HCL 50 MG PO TABS: 50.0000 mg | ORAL_TABLET | Freq: Four times a day (QID) | ORAL | 0 refills | Status: DC | PRN

## 2016-08-17 MED ORDER — PENICILLIN V POTASSIUM 500 MG PO TABS: 500.0000 mg | ORAL_TABLET | Freq: Three times a day (TID) | ORAL | 0 refills | Status: DC

## 2016-08-17 MED ORDER — PENICILLIN V POTASSIUM 250 MG PO TABS
500.0000 mg | ORAL_TABLET | Freq: Four times a day (QID) | ORAL | Status: DC
  Administered 2016-08-17: 500 mg via ORAL
  Filled 2016-08-17: qty 2

## 2016-08-17 MED ORDER — HYDROCODONE-ACETAMINOPHEN 5-325 MG PO TABS
1.0000 | ORAL_TABLET | Freq: Once | ORAL | Status: AC
  Administered 2016-08-17: 1 via ORAL
  Filled 2016-08-17: qty 1

## 2016-08-17 NOTE — Discharge Instructions (Signed)
Call your dentist to arrange a follow-up appt soon

## 2016-08-17 NOTE — ED Provider Notes (Signed)
[place] DEPT Provider Note   CSN: [id number] Arrival date & time: 08/17/16  1613  By signing my name below, I, Collene Leyden, attest that this documentation has been prepared under the direction and in the presence of Bristal Steffy PA-C.  Electronically Signed: Collene Leyden, Scribe. 08/17/16. 5:36 PM.  History   Chief Complaint Chief Complaint  Patient presents with  . Dental Pain    HPI Comments: Wayne Avila is a 26 y.o. male with a history of bipolar, who presents to the Emergency Department complaining of sudden-onset, constant right lower third molar pain that began several hours earlier. Patient has a decaying cavity to the right lower tooth. Patient reports associated headache and right facial pain. Patient does have a primary care dentist.  Patient reports taking tylenol with no relief. Patient denies any facial swelling, nausea, vomiting, fever and neck pain.   HPI  Past Medical History:  Diagnosis Date  . ADHD (attention deficit hyperactivity disorder)   . Anxiety   . Asthma   . Bipolar 1 disorder ([place])   . Clostridium difficile colitis   . Depression   . GERD (gastroesophageal reflux disease)   . HOH (hard of hearing)   . Mental developmental delay   . Seizures [facility])     Patient Active Problem List   Diagnosis Date Noted  . Clostridium difficile colitis 05/24/2016  . Acute diarrhea 05/22/2016  . Abnormal thyroid function test 03/24/2016  . Acute blood loss anemia 03/23/2016  . C. difficile colitis 03/22/2016  . Hypovolemia 03/22/2016  . Bipolar I disorder ([place]) 03/22/2016  . Ulcerative colitis ([place]) 12/29/2015  . CAP (community acquired pneumonia)   . Aspiration pneumonitis ([place]) 11/21/2015  . Bipolar I disorder, most recent episode depressed ([place])   . Bipolar 1 disorder, manic, moderate ([place]) 11/09/2014  . Suicidal ideation   . Major depressive disorder, recurrent episode, moderate ([place])   . Oral thrush 02/23/2014  . Mental developmental delay  11/15/2012    Past Surgical History:  Procedure Laterality Date  . ADENOIDECTOMY    . BIOPSY N/A 12/07/2012   Procedure: GASTRIC BIOPSIES;  Surgeon: [name], MD;  Location: [facility];  Service: Endoscopy;  Laterality: N/A;  . BIOPSY N/A 08/30/2013   Procedure: BIOPSY;  Surgeon: [name], MD;  Location: [facility];  Service: Endoscopy;  Laterality: N/A;  right colon,transverse colon, left colon, rectal biopsies  . BIOPSY  11/20/2015   Procedure: BIOPSY;  Surgeon: [name], MD;  [facility];  Service: Endoscopy;;  ileal, right colon biopsy, left colon, rectum  . BIOPSY  04/18/2016   Procedure: BIOPSY;  Surgeon: [name], MD;  Location[facility];  Service: Endoscopy;;  left descending colon biopsies  . COLONOSCOPY  11/20/2015   Dr. [name]: Severe erythema, edema, ulcers from the anal verge to 20 cm above the anal verge without mucosal sparing, remaining colon and terminal ileum appeared normal. Biopsies from the rectum revealed fulminant active chronic colitis consistent with IBD. Pathology from terminal ileum revealed intramucosal lymphoid aggregates. Remaining colon random biopsies with inactive chronic colitis  . COLONOSCOPY WITH PROPOFOL N/A 08/30/2013   UVO:[id number] mucosa in the terminal ileum/COLITIS/ MILD PROCTITIS. 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  Marland Kitchen COLONOSCOPY WITH PROPOFOL N/A 11/20/2015   Dr. 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 ESOPHAGOGASTRODUODENOSCOPY  11/20/2015   Dr. [name]: Normal  exam, stomach biopsied and duodenal biopsy.. Benign biopsies.  . ESOPHAGOGASTRODUODENOSCOPY (EGD) WITH PROPOFOL N/A 12/07/2012   SLF:The mucosa of the esophagus appeared normal Non-erosive gastritis (inflammation) was found in the gastric antrum; multiple biopsies The duodenal mucosa showed no abnormalities in the  bulb and second portion of the duodenum  . ESOPHAGOGASTRODUODENOSCOPY (EGD) WITH PROPOFOL N/A 11/20/2015   Dr. [name]: normal with normal biopsies, negative H.pylori   . ESOPHAGOGASTRODUODENOSCOPY (EGD) WITH PROPOFOL N/A 04/18/2016   Procedure: ESOPHAGOGASTRODUODENOSCOPY (EGD) WITH PROPOFOL;  Surgeon: [name], MD;  [facility];  Service: Endoscopy;  Laterality: N/A;  . FLEXIBLE SIGMOIDOSCOPY N/A 04/18/2016   Procedure: FLEXIBLE SIGMOIDOSCOPY;  Surgeon: [name], MD;  [facility];  Service: Endoscopy;  Laterality: N/A;  . TONSILLECTOMY         Home Medications    Prior to Admission medications   Medication Sig Start Date End Date Taking? Authorizing Provider  albuterol (PROVENTIL HFA;VENTOLIN HFA) 108 (90 Base) MCG/ACT inhaler Inhale 2 puffs into the lungs every 4 (four) hours as needed for wheezing or shortness of breath (and/or cough).    Historical Provider, MD  albuterol (PROVENTIL) (2.5 MG/3ML) 0.083% nebulizer solution Inhale 2.5 mg into the lungs every 6 (six) hours as needed for wheezing or shortness of breath.     Historical Provider, MD  atomoxetine (STRATTERA) 100 MG capsule Take 100 mg by mouth daily.     Historical Provider, MD  cholecalciferol (VITAMIN D) 1000 units tablet Take 1 tablet (1,000 Units total) by mouth daily. START AFTER VITAMIN D 50,000 U TABLETS ARE COMPLETE Patient taking differently: Take 1,000 Units by mouth daily.  04/08/16   [name], MD  divalproex (DEPAKOTE) 500 MG DR tablet Take 1 tablet (500 mg total) by mouth 2 (two) times daily. 07/03/15   Penni Bombard, MD  EPINEPHrine (EPIPEN 2-PAK) 0.3 mg/0.3 mL IJ SOAJ injection Inject 0.3 mg into the muscle once as needed (for severe allergic reaction).    Historical Provider, MD  loratadine (CLARITIN) 10 MG tablet Take 10 mg by mouth daily.    Historical Provider, MD  mometasone (NASONEX) 50 MCG/ACT nasal spray Place 2 sprays into the nose daily. 06/19/16   Liesel Peckenpaugh, PA-C  montelukast (SINGULAIR) 10 MG tablet Take 1 tablet (10 mg total) by mouth at bedtime. 11/13/14   [name], NP  ondansetron (ZOFRAN) 4 MG tablet 1 PO TID FOR 3 DAYS THEN Q4-6H PRN NAUSEA/VOMTING Patient taking differently: Take 4 mg by mouth every 8 (eight) hours as needed for nausea or vomiting.  05/22/16   [name], MD  ondansetron (ZOFRAN) 4 MG tablet Take 1 tablet (4 mg total) by mouth every 6 (six) hours as needed for nausea or vomiting. 07/22/16   Forde Dandy, MD  pseudoephedrine (SUDAFED) 60 MG tablet Take 1 tablet (60 mg total) by mouth every 6 (six) hours as needed for congestion. 06/19/16   Correna Meacham, PA-C  sertraline (ZOLOFT) 100 MG tablet Take 1 tablet (100 mg total) by mouth daily. 11/13/14   [name], NP  traZODone (DESYREL) 100 MG tablet Take 100 mg by mouth at bedtime.    Historical Provider, MD    Family History Family History  Problem Relation Age of Onset  . Asthma Mother   . Ulcers Mother   . Bipolar disorder Mother   . ADD / ADHD Father   . Diabetes Maternal Grandmother   . Diabetes Maternal Grandfather   .  Colon cancer Neg Hx   . Liver disease Neg Hx     Social History Social History  Substance Use Topics  . Smoking status: Former Smoker    Packs/day: 0.00    Years: 2.00    Quit date: 10/06/2012  . Smokeless tobacco: Never Used     Comment: Never smoked cigarettes  . Alcohol use No     Comment: denies usage     Allergies   Amoxicillin-pot clavulanate; Ibuprofen; Omeprazole; Pineapple; Strawberry extract; and Tomato   Review of Systems Review of Systems  Constitutional: Negative for appetite change and fever.  HENT: Positive for dental problem. Negative for congestion, facial swelling, sore throat and trouble swallowing.   Eyes: Negative for pain and visual disturbance.  Gastrointestinal: Negative for nausea and vomiting.  Musculoskeletal: Negative for neck pain and neck stiffness.  Neurological: Positive for  headaches. Negative for dizziness and facial asymmetry.  Hematological: Negative for adenopathy.  All other systems reviewed and are negative.    Physical Exam Updated Vital Signs BP 146/87   Pulse 97   Temp 98.2 F (36.8 C)   Resp 18   Ht 5' 8"  (1.727 m)   Wt 224 lb (101.6 kg)   SpO2 100%   BMI 34.06 kg/m   Physical Exam  Constitutional: He is oriented to person, place, and time. He appears well-developed and well-nourished. No distress.  HENT:  Head: Normocephalic and atraumatic.  Mouth/Throat: Uvula is midline, oropharynx is clear and moist and mucous membranes are normal. No trismus in the jaw. Dental caries present. No uvula swelling.  Tenderness with dental caries of the right lower third molar. Tooth is mildly impacted. No obvious dental abscess. No trismus.   Eyes: Conjunctivae are normal.  Neck: Normal range of motion. Neck supple.  Cardiovascular: Normal rate and regular rhythm.   No murmur heard. Pulmonary/Chest: Effort normal.  Musculoskeletal: Normal range of motion.  Lymphadenopathy:    He has no cervical adenopathy.  Neurological: He is alert and oriented to person, place, and time.  Skin: Skin is warm and dry.  Psychiatric: He has a normal mood and affect.  Nursing note and vitals reviewed.    ED Treatments / Results  DIAGNOSTIC STUDIES: Oxygen Saturation is 100% on RA, normal by my interpretation.    COORDINATION OF CARE: 5:35 PM Discussed treatment plan with pt at bedside and pt agreed to plan, which includes antibiotics.   Labs (all labs ordered are listed, but only abnormal results are displayed) Labs Reviewed - No data to display  EKG  EKG Interpretation None       Radiology No results found.  Procedures Procedures (including critical care time)  Medications Ordered in ED Medications - No data to display   Initial Impression / Assessment and Plan / ED Course  I have reviewed the triage vital signs and the nursing  notes.  Pertinent labs & imaging results that were available during my care of the patient were reviewed by me and considered in my medical decision making (see chart for details).    Reviewed the [place] narcotic database, the patient received #20 5 mg percocet on 05/30/16.   Pt well appearing.  Vitals stable.  Airway patent.  likely impacted third molar. No concerning sx's for dental abscess or Ludwig's angina. Pt agrees to close f/u with a dentist.      Final Clinical Impressions(s) / ED Diagnoses   Final diagnoses:  Pain, dental    New Prescriptions Discharge Medication List as  of 08/17/2016  5:46 PM    START taking these medications   Details  penicillin v potassium (VEETID) 500 MG tablet Take 1 tablet (500 mg total) by mouth 3 (three) times daily., Starting Sun 08/17/2016, Print    traMADol (ULTRAM) 50 MG tablet Take 1 tablet (50 mg total) by mouth every 6 (six) hours as needed., Starting Sun 08/17/2016, Print        I personally performed the services described in this documentation, which was scribed in my presence. The recorded information has been reviewed and is accurate.    Kem Parkinson, PA-C 08/19/16 [place], DO 08/20/16 1554

## 2016-08-17 NOTE — ED Triage Notes (Signed)
Right lower dental pain today.

## 2016-09-12 ENCOUNTER — Encounter (HOSPITAL_COMMUNITY): Payer: Self-pay

## 2016-09-12 ENCOUNTER — Emergency Department (HOSPITAL_COMMUNITY)
Admission: EM | Admit: 2016-09-12 | Discharge: 2016-09-12 | Disposition: A | Discharge status: Home / Self Care | Payer: Medicaid Other | Attending: Emergency Medicine | Admitting: Emergency Medicine

## 2016-09-12 DIAGNOSIS — J45909 Unspecified asthma, uncomplicated: Secondary | ICD-10-CM | POA: Diagnosis not present

## 2016-09-12 DIAGNOSIS — Z87891 Personal history of nicotine dependence: Secondary | ICD-10-CM | POA: Diagnosis not present

## 2016-09-12 DIAGNOSIS — Z79899 Other long term (current) drug therapy: Secondary | ICD-10-CM | POA: Diagnosis not present

## 2016-09-12 DIAGNOSIS — H1089 Other conjunctivitis: Secondary | ICD-10-CM | POA: Diagnosis not present

## 2016-09-12 DIAGNOSIS — H5711 Ocular pain, right eye: Secondary | ICD-10-CM | POA: Diagnosis present

## 2016-09-12 DIAGNOSIS — F909 Attention-deficit hyperactivity disorder, unspecified type: Secondary | ICD-10-CM | POA: Insufficient documentation

## 2016-09-12 DIAGNOSIS — H1031 Unspecified acute conjunctivitis, right eye: Secondary | ICD-10-CM

## 2016-09-12 MED ORDER — TOBRAMYCIN 0.3 % OP SOLN
1.0000 [drp] | Freq: Once | OPHTHALMIC | Status: AC
  Administered 2016-09-12: 1 [drp] via OPHTHALMIC
  Filled 2016-09-12: qty 5

## 2016-09-12 NOTE — ED Notes (Signed)
Eye exam  OD: 20/40 OS: 20/30 OU: 20/30

## 2016-09-12 NOTE — Discharge Instructions (Signed)
Apply 1 drop of the antibiotic given tonight to each eye every 3 hours while you are awake for the next 7 days.  Avoid rubbing your eyes and wash your hands frequently as discussed.

## 2016-09-12 NOTE — ED Triage Notes (Signed)
Pt reports that right eye started burning last night. States had difficulty sleeping due to pain. Eye is red and eyelid swollen. Green drainage in eye this morning. Denies trauma

## 2016-09-12 NOTE — ED Provider Notes (Signed)
[facility] DEPT Provider Note   CSN: [id number] Arrival date & time: 09/12/16  1613     History   Chief Complaint Chief Complaint  Patient presents with  . Eye Pain    HPI Wayne Avila is a 26 y.o. male presenting for evaluation of suspected pink eye, stating he was exposed to is aunt (who works in daycare) with this same infection.  He noticed irritation and burning sensation in the right eye last night and when he woke today he had eye redness and green matting which he had to clean to open the eye.  He endorses rubbing the eye and now has increased eyelid swelling. He denies any left eye sx. Denies foreign body sensation..  The history is provided by the patient.    Past Medical History:  Diagnosis Date  . ADHD (attention deficit hyperactivity disorder)   . Anxiety   . Asthma   . Bipolar 1 disorder ([place])   . Clostridium difficile colitis   . Depression   . GERD (gastroesophageal reflux disease)   . HOH (hard of hearing)   . Mental developmental delay   . Seizures [facility])     Patient Active Problem List   Diagnosis Date Noted  . Clostridium difficile colitis 05/24/2016  . Acute diarrhea 05/22/2016  . Abnormal thyroid function test 03/24/2016  . Acute blood loss anemia 03/23/2016  . C. difficile colitis 03/22/2016  . Hypovolemia 03/22/2016  . Bipolar I disorder ([place]) 03/22/2016  . Ulcerative colitis ([place]) 12/29/2015  . CAP (community acquired pneumonia)   . Aspiration pneumonitis ([place]) 11/21/2015  . Bipolar I disorder, most recent episode depressed ([place])   . Bipolar 1 disorder, manic, moderate ([place]) 11/09/2014  . Suicidal ideation   . Major depressive disorder, recurrent episode, moderate ([place])   . Oral thrush 02/23/2014  . Mental developmental delay 11/15/2012    Past Surgical History:  Procedure Laterality Date  . ADENOIDECTOMY    . BIOPSY N/A 12/07/2012   Procedure: GASTRIC BIOPSIES;  Surgeon: [name], MD;  Location: [facility];  Service: Endoscopy;   Laterality: N/A;  . BIOPSY N/A 08/30/2013   Procedure: BIOPSY;  Surgeon: [name], MD;  Location: [facility];  Service: Endoscopy;  Laterality: N/A;  right colon,transverse colon, left colon, rectal biopsies  . BIOPSY  11/20/2015   Procedure: BIOPSY;  Surgeon: [name], MD;  Location[facility];  Service: Endoscopy;;  ileal, right colon biopsy, left colon, rectum  . BIOPSY  04/18/2016   Procedure: BIOPSY;  Surgeon: [name], MD;  Location: [facility];  Service: Endoscopy;;  left descending colon biopsies  . COLONOSCOPY  11/20/2015   Dr. [name]: Severe erythema, edema, ulcers from the anal verge to 20 cm above the anal verge without mucosal sparing, remaining colon and terminal ileum appeared normal. Biopsies from the rectum revealed fulminant active chronic colitis consistent with IBD. Pathology from terminal ileum revealed intramucosal lymphoid aggregates. Remaining colon random biopsies with inactive chronic colitis  . COLONOSCOPY WITH PROPOFOL N/A 08/30/2013   [id number] mucosa in the terminal ileum/COLITIS/ MILD PROCTITIS. 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  Marland Kitchen COLONOSCOPY WITH PROPOFOL N/A 11/20/2015   Dr. 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 ESOPHAGOGASTRODUODENOSCOPY  11/20/2015   Dr. [name]: Normal exam, stomach biopsied and duodenal biopsy.. Benign biopsies.  . ESOPHAGOGASTRODUODENOSCOPY (EGD) WITH PROPOFOL N/A 12/07/2012  SLF:The mucosa of the esophagus appeared normal Non-erosive gastritis (inflammation) was found in the gastric antrum; multiple biopsies The duodenal mucosa showed no abnormalities in the bulb and second portion of the duodenum  . ESOPHAGOGASTRODUODENOSCOPY (EGD) WITH PROPOFOL N/A 11/20/2015   Dr. [name]: normal with normal biopsies, negative H.pylori   . ESOPHAGOGASTRODUODENOSCOPY (EGD) WITH  PROPOFOL N/A 04/18/2016   Procedure: ESOPHAGOGASTRODUODENOSCOPY (EGD) WITH PROPOFOL;  Surgeon: [name], MD;  [facility];  Service: Endoscopy;  Laterality: N/A;  . FLEXIBLE SIGMOIDOSCOPY N/A 04/18/2016   Procedure: FLEXIBLE SIGMOIDOSCOPY;  Surgeon: [name], MD;  [facility];  Service: Endoscopy;  Laterality: N/A;  . TONSILLECTOMY         Home Medications    Prior to Admission medications   Medication Sig Start Date End Date Taking? Authorizing Provider  albuterol (PROVENTIL HFA;VENTOLIN HFA) 108 (90 Base) MCG/ACT inhaler Inhale 2 puffs into the lungs every 4 (four) hours as needed for wheezing or shortness of breath (and/or cough).    Historical Provider, MD  albuterol (PROVENTIL) (2.5 MG/3ML) 0.083% nebulizer solution Inhale 2.5 mg into the lungs every 6 (six) hours as needed for wheezing or shortness of breath.     Historical Provider, MD  atomoxetine (STRATTERA) 100 MG capsule Take 100 mg by mouth daily.     Historical Provider, MD  cholecalciferol (VITAMIN D) 1000 units tablet Take 1 tablet (1,000 Units total) by mouth daily. START AFTER VITAMIN D 50,000 U TABLETS ARE COMPLETE Patient taking differently: Take 1,000 Units by mouth daily.  04/08/16   [name], MD  divalproex (DEPAKOTE) 500 MG DR tablet Take 1 tablet (500 mg total) by mouth 2 (two) times daily. 07/03/15   Penni Bombard, MD  EPINEPHrine (EPIPEN 2-PAK) 0.3 mg/0.3 mL IJ SOAJ injection Inject 0.3 mg into the muscle once as needed (for severe allergic reaction).    Historical Provider, MD  loratadine (CLARITIN) 10 MG tablet Take 10 mg by mouth daily.    Historical Provider, MD  mometasone (NASONEX) 50 MCG/ACT nasal spray Place 2 sprays into the nose daily. 06/19/16   Tammy Triplett, PA-C  montelukast (SINGULAIR) 10 MG tablet Take 1 tablet (10 mg total) by mouth at bedtime. 11/13/14   [name], NP  ondansetron (ZOFRAN) 4 MG tablet 1 PO TID FOR 3 DAYS THEN Q4-6H PRN  NAUSEA/VOMTING Patient taking differently: Take 4 mg by mouth every 8 (eight) hours as needed for nausea or vomiting.  05/22/16   [name], MD  ondansetron (ZOFRAN) 4 MG tablet Take 1 tablet (4 mg total) by mouth every 6 (six) hours as needed for nausea or vomiting. 07/22/16   Forde Dandy, MD  penicillin v potassium (VEETID) 500 MG tablet Take 1 tablet (500 mg total) by mouth 3 (three) times daily. 08/17/16   Tammy Triplett, PA-C  pseudoephedrine (SUDAFED) 60 MG tablet Take 1 tablet (60 mg total) by mouth every 6 (six) hours as needed for congestion. 06/19/16   Tammy Triplett, PA-C  sertraline (ZOLOFT) 100 MG tablet Take 1 tablet (100 mg total) by mouth daily. 11/13/14   [name], NP  traMADol (ULTRAM) 50 MG tablet Take 1 tablet (50 mg total) by mouth every 6 (six) hours as needed. 08/17/16   Tammy Triplett, PA-C  traZODone (DESYREL) 100 MG tablet Take 100 mg by mouth at bedtime.    Historical Provider, MD    Family History Family History  Problem Relation Age of Onset  .  Asthma Mother   . Ulcers Mother   . Bipolar disorder Mother   . ADD / ADHD Father   . Diabetes Maternal Grandmother   . Diabetes Maternal Grandfather   . Colon cancer Neg Hx   . Liver disease Neg Hx     Social History Social History  Substance Use Topics  . Smoking status: Former Smoker    Packs/day: 0.00    Years: 2.00    Quit date: 10/06/2012  . Smokeless tobacco: Never Used     Comment: Never smoked cigarettes  . Alcohol use No     Comment: denies usage     Allergies   Amoxicillin-pot clavulanate; Ibuprofen; Omeprazole; Pineapple; Strawberry extract; and Tomato   Review of Systems Review of Systems  Constitutional: Negative for chills and fever.  HENT: Negative for congestion, ear pain, rhinorrhea, sinus pressure, sore throat, trouble swallowing and voice change.   Eyes: Positive for pain, discharge and redness. Negative for photophobia, itching and visual disturbance.  Respiratory: Negative for  cough, shortness of breath, wheezing and stridor.   Cardiovascular: Negative for chest pain.  Genitourinary: Negative.      Physical Exam Updated Vital Signs BP (!) 146/98 (BP Location: Right Arm)   Pulse (!) 106   Temp 97.9 F (36.6 C) (Oral)   Resp 16   Ht 5' 8"  (1.727 m)   Wt 101.6 kg   SpO2 100%   BMI 34.06 kg/m   Physical Exam  Constitutional: He is oriented to person, place, and time. He appears well-developed and well-nourished.  HENT:  Head: Normocephalic and atraumatic.  Right Ear: Tympanic membrane and ear canal normal.  Left Ear: Tympanic membrane and ear canal normal.  Nose: No mucosal edema or rhinorrhea.  Mouth/Throat: Uvula is midline, oropharynx is clear and moist and mucous membranes are normal. No oropharyngeal exudate, posterior oropharyngeal edema, posterior oropharyngeal erythema or tonsillar abscesses.  Eyes: EOM are normal. Pupils are equal, round, and reactive to light. Right eye exhibits exudate. Right eye exhibits no chemosis. Left eye exhibits no exudate. Right conjunctiva is injected. Left conjunctiva is not injected.  Eye exam  OD: 20/40 OS: 20/30 OU: 20/30  Mild right upper lid edema without erythema.  Cardiovascular: Normal rate.   Musculoskeletal: Normal range of motion.  Neurological: He is alert and oriented to person, place, and time.  Skin: Skin is warm and dry. No rash noted.  Psychiatric: He has a normal mood and affect.     ED Treatments / Results  Labs (all labs ordered are listed, but only abnormal results are displayed) Labs Reviewed - No data to display  EKG  EKG Interpretation None       Radiology No results found.  Procedures Procedures (including critical care time)  Medications Ordered in ED Medications  tobramycin (TOBREX) 0.3 % ophthalmic solution 1 drop (not administered)     Initial Impression / Assessment and Plan / ED Course  I have reviewed the triage vital signs and the nursing  notes.  Pertinent labs & imaging results that were available during my care of the patient were reviewed by me and considered in my medical decision making (see chart for details).     tobrex, frequent hand washing, avoid rubbing eyes.  No sx suggesting fb or abrasion.  No visual changes per pt report.  Final Clinical Impressions(s) / ED Diagnoses   Final diagnoses:  Acute bacterial conjunctivitis of right eye    New Prescriptions New Prescriptions   No medications on  file     Evalee Jefferson, PA-C 09/13/16 [place], MD 09/15/16 [phone]

## 2016-10-05 ENCOUNTER — Encounter (HOSPITAL_COMMUNITY): Payer: Self-pay | Admitting: *Deleted

## 2016-10-05 ENCOUNTER — Emergency Department (HOSPITAL_COMMUNITY)
Admission: EM | Admit: 2016-10-05 | Discharge: 2016-10-06 | Disposition: A | Discharge status: Home / Self Care | Payer: Medicaid Other | Source: Home / Self Care | Attending: Emergency Medicine | Admitting: Emergency Medicine

## 2016-10-05 ENCOUNTER — Emergency Department (HOSPITAL_COMMUNITY)
Admission: EM | Admit: 2016-10-05 | Discharge: 2016-10-05 | Disposition: A | Discharge status: Home / Self Care | Payer: Medicaid Other | Attending: Emergency Medicine | Admitting: Emergency Medicine

## 2016-10-05 DIAGNOSIS — F909 Attention-deficit hyperactivity disorder, unspecified type: Secondary | ICD-10-CM | POA: Insufficient documentation

## 2016-10-05 DIAGNOSIS — I10 Essential (primary) hypertension: Secondary | ICD-10-CM

## 2016-10-05 DIAGNOSIS — R51 Headache: Secondary | ICD-10-CM

## 2016-10-05 DIAGNOSIS — Z79899 Other long term (current) drug therapy: Secondary | ICD-10-CM | POA: Diagnosis not present

## 2016-10-05 DIAGNOSIS — G43009 Migraine without aura, not intractable, without status migrainosus: Secondary | ICD-10-CM | POA: Insufficient documentation

## 2016-10-05 DIAGNOSIS — J45909 Unspecified asthma, uncomplicated: Secondary | ICD-10-CM | POA: Insufficient documentation

## 2016-10-05 DIAGNOSIS — R519 Headache, unspecified: Secondary | ICD-10-CM

## 2016-10-05 DIAGNOSIS — Z87891 Personal history of nicotine dependence: Secondary | ICD-10-CM | POA: Insufficient documentation

## 2016-10-05 MED ORDER — PROMETHAZINE HCL 25 MG/ML IJ SOLN
25.0000 mg | Freq: Once | INTRAMUSCULAR | Status: AC
  Administered 2016-10-05: 25 mg via INTRAMUSCULAR
  Filled 2016-10-05: qty 1

## 2016-10-05 MED ORDER — METOCLOPRAMIDE HCL 5 MG/ML IJ SOLN
10.0000 mg | Freq: Once | INTRAMUSCULAR | Status: AC
  Administered 2016-10-06: 10 mg via INTRAVENOUS
  Filled 2016-10-05: qty 2

## 2016-10-05 MED ORDER — DIPHENHYDRAMINE HCL 50 MG/ML IJ SOLN
25.0000 mg | Freq: Once | INTRAMUSCULAR | Status: AC
  Administered 2016-10-06: 25 mg via INTRAVENOUS
  Filled 2016-10-05: qty 1

## 2016-10-05 MED ORDER — SODIUM CHLORIDE 0.9 % IV BOLUS (SEPSIS)
1000.0000 mL | Freq: Once | INTRAVENOUS | Status: AC
  Administered 2016-10-06: 1000 mL via INTRAVENOUS

## 2016-10-05 NOTE — ED Provider Notes (Signed)
[facility] DEPT Provider Note   CSN: [id number] Arrival date & time: 10/05/16  1324  By signing my name below, I, Wayne Avila, attest that this documentation has been prepared under the direction and in the presence of Davonna Belling, MD. Electronically Signed: Sonum Avila, Education administrator. 10/05/16. 1:45 PM.  History   Chief Complaint Chief Complaint  Patient presents with  . Headache    The history is provided by the patient and a relative. No language interpreter was used.     HPI Comments: Wayne Avila is a 26 y.o. male who presents to the Emergency Department complaining of a persistent HA with associated dizziness that began this morning. His blood pressure at home was 170/100. He denies a history of similar HAs. He tried 2 Tylenol tablets without relief. He denies falls or trauma. He denies numbness, weakness, CP, SOB, blurry vision, fever, chills, photophobia, tinnitus, sore throat. He denies drug use.   Past Medical History:  Diagnosis Date  . ADHD (attention deficit hyperactivity disorder)   . Anxiety   . Asthma   . Bipolar 1 disorder ([place])   . Clostridium difficile colitis   . Depression   . GERD (gastroesophageal reflux disease)   . HOH (hard of hearing)   . Mental developmental delay   . Seizures [facility])     Patient Active Problem List   Diagnosis Date Noted  . Clostridium difficile colitis 05/24/2016  . Acute diarrhea 05/22/2016  . Abnormal thyroid function test 03/24/2016  . Acute blood loss anemia 03/23/2016  . C. difficile colitis 03/22/2016  . Hypovolemia 03/22/2016  . Bipolar I disorder ([place]) 03/22/2016  . Ulcerative colitis ([place]) 12/29/2015  . CAP (community acquired pneumonia)   . Aspiration pneumonitis ([place]) 11/21/2015  . Bipolar I disorder, most recent episode depressed ([place])   . Bipolar 1 disorder, manic, moderate ([place]) 11/09/2014  . Suicidal ideation   . Major depressive disorder, recurrent episode, moderate ([place])   . Oral thrush 02/23/2014  .  Mental developmental delay 11/15/2012    Past Surgical History:  Procedure Laterality Date  . ADENOIDECTOMY    . BIOPSY N/A 12/07/2012   Procedure: GASTRIC BIOPSIES;  Surgeon: [name], MD;  Location: [facility];  Service: Endoscopy;  Laterality: N/A;  . BIOPSY N/A 08/30/2013   Procedure: BIOPSY;  Surgeon: [name], MD;  Location: [facility];  Service: Endoscopy;  Laterality: N/A;  right colon,transverse colon, left colon, rectal biopsies  . BIOPSY  11/20/2015   Procedure: BIOPSY;  Surgeon: [name], MD;  [facility];  Service: Endoscopy;;  ileal, right colon biopsy, left colon, rectum  . BIOPSY  04/18/2016   Procedure: BIOPSY;  Surgeon: [name], MD;  [facility];  Service: Endoscopy;;  left descending colon biopsies  . COLONOSCOPY  11/20/2015   Dr. [name]: Severe erythema, edema, ulcers from the anal verge to 20 cm above the anal verge without mucosal sparing, remaining colon and terminal ileum appeared normal. Biopsies from the rectum revealed fulminant active chronic colitis consistent with IBD. Pathology from terminal ileum revealed intramucosal lymphoid aggregates. Remaining colon random biopsies with inactive chronic colitis  . COLONOSCOPY WITH PROPOFOL N/A 08/30/2013   [id number] mucosa in the terminal ileum/COLITIS/ MILD PROCTITIS. 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  Marland Kitchen COLONOSCOPY WITH PROPOFOL N/A 11/20/2015   Dr. 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  ESOPHAGOGASTRODUODENOSCOPY  11/20/2015   Dr. [name]: Normal exam, stomach biopsied and duodenal biopsy.. Benign biopsies.  . ESOPHAGOGASTRODUODENOSCOPY (EGD) WITH PROPOFOL N/A 12/07/2012   SLF:The mucosa of the esophagus appeared normal Non-erosive gastritis (inflammation) was found in the gastric antrum; multiple biopsies The duodenal mucosa  showed no abnormalities in the bulb and second portion of the duodenum  . ESOPHAGOGASTRODUODENOSCOPY (EGD) WITH PROPOFOL N/A 11/20/2015   Dr. [name]: normal with normal biopsies, negative H.pylori   . ESOPHAGOGASTRODUODENOSCOPY (EGD) WITH PROPOFOL N/A 04/18/2016   Procedure: ESOPHAGOGASTRODUODENOSCOPY (EGD) WITH PROPOFOL;  Surgeon: [name], MD;  [facility];  Service: Endoscopy;  Laterality: N/A;  . FLEXIBLE SIGMOIDOSCOPY N/A 04/18/2016   Procedure: FLEXIBLE SIGMOIDOSCOPY;  Surgeon: [name], MD;  [facility];  Service: Endoscopy;  Laterality: N/A;  . TONSILLECTOMY         Home Medications    Prior to Admission medications   Medication Sig Start Date End Date Taking? Authorizing Provider  albuterol (PROVENTIL HFA;VENTOLIN HFA) 108 (90 Base) MCG/ACT inhaler Inhale 2 puffs into the lungs every 4 (four) hours as needed for wheezing or shortness of breath (and/or cough).    Historical Provider, MD  albuterol (PROVENTIL) (2.5 MG/3ML) 0.083% nebulizer solution Inhale 2.5 mg into the lungs every 6 (six) hours as needed for wheezing or shortness of breath.     Historical Provider, MD  atomoxetine (STRATTERA) 100 MG capsule Take 100 mg by mouth daily.     Historical Provider, MD  cholecalciferol (VITAMIN D) 1000 units tablet Take 1 tablet (1,000 Units total) by mouth daily. START AFTER VITAMIN D 50,000 U TABLETS ARE COMPLETE Patient taking differently: Take 1,000 Units by mouth daily.  04/08/16   [name], MD  divalproex (DEPAKOTE) 500 MG DR tablet Take 1 tablet (500 mg total) by mouth 2 (two) times daily. 07/03/15   Penni Bombard, MD  EPINEPHrine (EPIPEN 2-PAK) 0.3 mg/0.3 mL IJ SOAJ injection Inject 0.3 mg into the muscle once as needed (for severe allergic reaction).    Historical Provider, MD  loratadine (CLARITIN) 10 MG tablet Take 10 mg by mouth daily.    Historical Provider, MD  mometasone (NASONEX) 50 MCG/ACT nasal spray Place 2 sprays into the  nose daily. 06/19/16   Tammy Triplett, PA-C  montelukast (SINGULAIR) 10 MG tablet Take 1 tablet (10 mg total) by mouth at bedtime. 11/13/14   [name], NP  ondansetron (ZOFRAN) 4 MG tablet 1 PO TID FOR 3 DAYS THEN Q4-6H PRN NAUSEA/VOMTING Patient taking differently: Take 4 mg by mouth every 8 (eight) hours as needed for nausea or vomiting.  05/22/16   [name], MD  ondansetron (ZOFRAN) 4 MG tablet Take 1 tablet (4 mg total) by mouth every 6 (six) hours as needed for nausea or vomiting. 07/22/16   Forde Dandy, MD  penicillin v potassium (VEETID) 500 MG tablet Take 1 tablet (500 mg total) by mouth 3 (three) times daily. 08/17/16   Tammy Triplett, PA-C  pseudoephedrine (SUDAFED) 60 MG tablet Take 1 tablet (60 mg total) by mouth every 6 (six) hours as needed for congestion. 06/19/16   Tammy Triplett, PA-C  sertraline (ZOLOFT) 100 MG tablet Take 1 tablet (100 mg total) by mouth daily. 11/13/14   [name], NP  traMADol (ULTRAM) 50 MG tablet Take 1 tablet (50 mg total) by mouth every 6 (six) hours as needed. 08/17/16   Tammy Triplett, PA-C  traZODone (DESYREL) 100 MG tablet Take 100  mg by mouth at bedtime.    Historical Provider, MD    Family History Family History  Problem Relation Age of Onset  . Asthma Mother   . Ulcers Mother   . Bipolar disorder Mother   . ADD / ADHD Father   . Diabetes Maternal Grandmother   . Diabetes Maternal Grandfather   . Colon cancer Neg Hx   . Liver disease Neg Hx     Social History Social History  Substance Use Topics  . Smoking status: Former Smoker    Packs/day: 0.00    Years: 2.00    Quit date: 10/06/2012  . Smokeless tobacco: Never Used     Comment: Never smoked cigarettes  . Alcohol use No     Comment: denies usage     Allergies   Amoxicillin-pot clavulanate; Ibuprofen; Omeprazole; Pineapple; Strawberry extract; and Tomato   Review of Systems Review of Systems  Constitutional: Negative for chills and fever.  HENT: Negative for ear  pain, sore throat and tinnitus.   Eyes: Negative for photophobia and visual disturbance.  Respiratory: Negative for shortness of breath.   Cardiovascular: Negative for chest pain.  Neurological: Positive for dizziness and headaches. Negative for weakness and numbness.  All other systems reviewed and are negative.    Physical Exam Updated Vital Signs BP (!) 141/95   Pulse 88   Temp 98.4 F (36.9 C) (Oral)   Resp 18   Ht 5' 8"  (1.727 m)   Wt 224 lb (101.6 kg)   SpO2 100%   BMI 34.06 kg/m   Physical Exam  Constitutional: He is oriented to person, place, and time. He appears well-developed and well-nourished.  HENT:  Head: Normocephalic and atraumatic.  Right Ear: External ear normal.  Left Ear: External ear normal.  Nose: Nose normal.  Mouth/Throat: Oropharynx is clear and moist. No oropharyngeal exudate.  Eyes: Conjunctivae and EOM are normal. Pupils are equal, round, and reactive to light.  Neck: Normal range of motion. Neck supple.  Cardiovascular: Normal rate, regular rhythm and normal heart sounds.   Pulmonary/Chest: Effort normal and breath sounds normal. No respiratory distress. He has no wheezes. He has no rales.  Abdominal: Soft. There is no tenderness.  Musculoskeletal: Normal range of motion. He exhibits no deformity.  Neurological: He is alert and oriented to person, place, and time.  Skin: Skin is warm and dry.  Psychiatric: He has a normal mood and affect.  Nursing note and vitals reviewed.    ED Treatments / Results  COORDINATION OF CARE: 1:43 PM Discussed treatment plan with pt at bedside and pt agreed to plan.   Labs (all labs ordered are listed, but only abnormal results are displayed) Labs Reviewed - No data to display  EKG  EKG Interpretation None       Radiology No results found.  Procedures Procedures (including critical care time)  Medications Ordered in ED Medications - No data to display   Initial Impression / Assessment and  Plan / ED Course  I have reviewed the triage vital signs and the nursing notes.  Pertinent labs & imaging results that were available during my care of the patient were reviewed by me and considered in my medical decision making (see chart for details).     Patient resents with headache. Dull. Nonfocal exam. Has some mild hypertension that has improved. Reviewed records and has had CT 3 years ago. No trauma. No history of hypertension but will need to be followed because it was 170/110  at home. Given IM Phenergan. Patient's headaches improved somewhat. Will discharge home.  Final Clinical Impressions(s) / ED Diagnoses   Final diagnoses:  None    New Prescriptions New Prescriptions   No medications on file   I personally performed the services described in this documentation, which was scribed in my presence. The recorded information has been reviewed and is accurate.      Davonna Belling, MD 10/05/16 [phone]

## 2016-10-05 NOTE — ED Triage Notes (Signed)
Pt has a headache that started this morning. States his blood pressure was high a home 170/100. Currently 141/95 in triage. Denies any unilateral weakness.

## 2016-10-05 NOTE — ED Provider Notes (Signed)
[facility] DEPT Provider Note   CSN: [id number] Arrival date & time: 10/05/16  2306   By signing my name below, I, Hilbert Odor, attest that this documentation has been prepared under the direction and in the presence of Rolland Porter, MD. Electronically Signed: Hilbert Odor, Scribe. 10/06/16. 12:29 AM.  Time seen: 11:48 PM History   Chief Complaint Chief Complaint  Patient presents with  . Headache    The history is provided by the patient. No language interpreter was used.  HPI Comments: TAKEO HARTS is a 26 y.o. male with hx of asthma, ADHD, and bipolar disorder brought in by ambulance, who presents to the Emergency Department complaining of an intermittent sharp headache that began around 11 am this morning. He was seen here around 1pm on 10/05/2016 for the same problem. He was given phenergan IM with improvement of his headache at that time. He states that the pain is primarily in the front of his head. The pain comes and goes and typically last for about 30 minutes at a time before resolving itself. His headache worsens when bending over and he describes photophobia without phonophobia. He states that nothing alleviates the pain. He took tylenol around lunch time with no relief. He has no known hx of headaches. He was seen several months ago for high blood pressure but was not diagnosed with anything at that time. He has kept a log of his blood pressures for his PCP and they decided he didn't need to be on BP medications.  He denies smoking or drinking EtOH. He denies fevers, sore throat, rhinorrhea, cough, numbness or tingling in his extremities, or any change in activity.Family history no known family history of migraines.  PCP [name], MD   Past Medical History:  Diagnosis Date  . ADHD (attention deficit hyperactivity disorder)   . Anxiety   . Asthma   . Bipolar 1 disorder ([place])   . Clostridium difficile colitis   . Depression   . GERD (gastroesophageal reflux  disease)   . HOH (hard of hearing)   . Mental developmental delay   . Seizures [facility])     Patient Active Problem List   Diagnosis Date Noted  . Clostridium difficile colitis 05/24/2016  . Acute diarrhea 05/22/2016  . Abnormal thyroid function test 03/24/2016  . Acute blood loss anemia 03/23/2016  . C. difficile colitis 03/22/2016  . Hypovolemia 03/22/2016  . Bipolar I disorder ([place]) 03/22/2016  . Ulcerative colitis ([place]) 12/29/2015  . CAP (community acquired pneumonia)   . Aspiration pneumonitis ([place]) 11/21/2015  . Bipolar I disorder, most recent episode depressed ([place])   . Bipolar 1 disorder, manic, moderate ([place]) 11/09/2014  . Suicidal ideation   . Major depressive disorder, recurrent episode, moderate ([place])   . Oral thrush 02/23/2014  . Mental developmental delay 11/15/2012    Past Surgical History:  Procedure Laterality Date  . ADENOIDECTOMY    . BIOPSY N/A 12/07/2012   Procedure: GASTRIC BIOPSIES;  Surgeon: [name], MD;  Location: [facility];  Service: Endoscopy;  Laterality: N/A;  . BIOPSY N/A 08/30/2013   Procedure: BIOPSY;  Surgeon: [name], MD;  Location: [facility];  Service: Endoscopy;  Laterality: N/A;  right colon,transverse colon, left colon, rectal biopsies  . BIOPSY  11/20/2015   Procedure: BIOPSY;  Surgeon: [name], MD;  Location: [facility];  Service: Endoscopy;;  ileal, right colon biopsy, left colon, rectum  . BIOPSY  04/18/2016   Procedure: BIOPSY;  Surgeon:  [name], MD;  Location: [facility];  Service: Endoscopy;;  left descending colon biopsies  . COLONOSCOPY  11/20/2015   Dr. [name]: Severe erythema, edema, ulcers from the anal verge to 20 cm above the anal verge without mucosal sparing, remaining colon and terminal ileum appeared normal. Biopsies from the rectum revealed fulminant active chronic colitis consistent with IBD. Pathology from terminal ileum revealed intramucosal lymphoid aggregates. Remaining colon random biopsies with  inactive chronic colitis  . COLONOSCOPY WITH PROPOFOL N/A 08/30/2013   HEN:IDPOEU mucosa in the terminal ileum/COLITIS/ MILD PROCTITIS. 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  Marland Kitchen COLONOSCOPY WITH PROPOFOL N/A 11/20/2015   Dr. 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 ESOPHAGOGASTRODUODENOSCOPY  11/20/2015   Dr. [name]: Normal exam, stomach biopsied and duodenal biopsy.. Benign biopsies.  . ESOPHAGOGASTRODUODENOSCOPY (EGD) WITH PROPOFOL N/A 12/07/2012   SLF:The mucosa of the esophagus appeared normal Non-erosive gastritis (inflammation) was found in the gastric antrum; multiple biopsies The duodenal mucosa showed no abnormalities in the bulb and second portion of the duodenum  . ESOPHAGOGASTRODUODENOSCOPY (EGD) WITH PROPOFOL N/A 11/20/2015   Dr. [name]: normal with normal biopsies, negative H.pylori   . ESOPHAGOGASTRODUODENOSCOPY (EGD) WITH PROPOFOL N/A 04/18/2016   Procedure: ESOPHAGOGASTRODUODENOSCOPY (EGD) WITH PROPOFOL;  Surgeon: [name], MD;  Location[facility];  Service: Endoscopy;  Laterality: N/A;  . FLEXIBLE SIGMOIDOSCOPY N/A 04/18/2016   Procedure: FLEXIBLE SIGMOIDOSCOPY;  Surgeon: [name], MD;  Location: [facility];  Service: Endoscopy;  Laterality: N/A;  . TONSILLECTOMY         Home Medications    Prior to Admission medications   Medication Sig Start Date End Date Taking? Authorizing Provider  albuterol (PROVENTIL HFA;VENTOLIN HFA) 108 (90 Base) MCG/ACT inhaler Inhale 2 puffs into the lungs every 4 (four) hours as needed for wheezing or shortness of breath (and/or cough).    Historical Provider, MD  albuterol (PROVENTIL) (2.5 MG/3ML) 0.083% nebulizer solution Inhale 2.5 mg into the lungs every 6 (six) hours as needed for wheezing or shortness of breath.     Historical Provider, MD  atomoxetine (STRATTERA) 100 MG  capsule Take 100 mg by mouth daily.     Historical Provider, MD  cholecalciferol (VITAMIN D) 1000 units tablet Take 1 tablet (1,000 Units total) by mouth daily. START AFTER VITAMIN D 50,000 U TABLETS ARE COMPLETE Patient taking differently: Take 1,000 Units by mouth daily.  04/08/16   [name], MD  divalproex (DEPAKOTE) 500 MG DR tablet Take 1 tablet (500 mg total) by mouth 2 (two) times daily. 07/03/15   Penni Bombard, MD  EPINEPHrine (EPIPEN 2-PAK) 0.3 mg/0.3 mL IJ SOAJ injection Inject 0.3 mg into the muscle once as needed (for severe allergic reaction).    Historical Provider, MD  loratadine (CLARITIN) 10 MG tablet Take 10 mg by mouth daily.    Historical Provider, MD  mometasone (NASONEX) 50 MCG/ACT nasal spray Place 2 sprays into the nose daily. 06/19/16   Tammy Triplett, PA-C  montelukast (SINGULAIR) 10 MG tablet Take 1 tablet (10 mg total) by mouth at bedtime. 11/13/14   [name], NP  ondansetron (ZOFRAN) 4 MG tablet 1 PO TID FOR 3 DAYS THEN Q4-6H PRN NAUSEA/VOMTING Patient taking differently: Take 4 mg by mouth every 8 (eight) hours as needed for nausea or vomiting.  05/22/16   [name], MD  ondansetron (  ZOFRAN) 4 MG tablet Take 1 tablet (4 mg total) by mouth every 6 (six) hours as needed for nausea or vomiting. 07/22/16   Forde Dandy, MD  penicillin v potassium (VEETID) 500 MG tablet Take 1 tablet (500 mg total) by mouth 3 (three) times daily. Patient not taking: Reported on 10/05/2016 08/17/16   Tammy Triplett, PA-C  pseudoephedrine (SUDAFED) 60 MG tablet Take 1 tablet (60 mg total) by mouth every 6 (six) hours as needed for congestion. 06/19/16   Tammy Triplett, PA-C  sertraline (ZOLOFT) 100 MG tablet Take 1 tablet (100 mg total) by mouth daily. 11/13/14   [name], NP  traMADol (ULTRAM) 50 MG tablet Take 1 tablet (50 mg total) by mouth every 6 (six) hours as needed. Patient not taking: Reported on 10/05/2016 08/17/16   Tammy Triplett, PA-C  traZODone (DESYREL) 50 MG  tablet Take 1 tablet by mouth at bedtime. 09/10/16   Historical Provider, MD    Family History Family History  Problem Relation Age of Onset  . Asthma Mother   . Ulcers Mother   . Bipolar disorder Mother   . ADD / ADHD Father   . Diabetes Maternal Grandmother   . Diabetes Maternal Grandfather   . Colon cancer Neg Hx   . Liver disease Neg Hx     Social History Social History  Substance Use Topics  . Smoking status: Former Smoker    Packs/day: 0.00    Years: 2.00    Quit date: 10/06/2012  . Smokeless tobacco: Never Used     Comment: Never smoked cigarettes  . Alcohol use No     Comment: denies usage  on disability since he was a child for hearing problem and mental delay   Allergies   Amoxicillin-pot clavulanate; Ibuprofen; Omeprazole; Pineapple; Strawberry extract; and Tomato   Review of Systems Review of Systems  Constitutional: Negative for fever.  Eyes: Positive for photophobia.  Respiratory: Negative for shortness of breath.   Cardiovascular: Negative for chest pain.  Gastrointestinal: Negative for diarrhea, nausea and vomiting.  Neurological: Positive for headaches. Negative for numbness.  All other systems reviewed and are negative.    Physical Exam Updated Vital Signs BP (!) 143/92 (BP Location: Left Arm)   Pulse 99   Temp 98.9 F (37.2 C) (Oral)   Resp 16   Ht 5' 8"  (1.727 m)   Wt 224 lb (101.6 kg)   SpO2 99%   BMI 34.06 kg/m   Vital signs normal except for borderline hypertension   Physical Exam  Constitutional: He is oriented to person, place, and time. He appears well-developed and well-nourished.  Non-toxic appearance. He does not appear ill. No distress.  HENT:  Head: Normocephalic and atraumatic.  Right Ear: External ear normal.  Left Ear: External ear normal.  Nose: Nose normal. No mucosal edema or rhinorrhea.  Mouth/Throat: Oropharynx is clear and moist and mucous membranes are normal. No dental abscesses or uvula swelling.  Eyes:  Conjunctivae and EOM are normal. Pupils are equal, round, and reactive to light.  Neck: Normal range of motion and full passive range of motion without pain. Neck supple.  Cardiovascular: Normal rate, regular rhythm and normal heart sounds.  Exam reveals no gallop and no friction rub.   No murmur heard. Pulmonary/Chest: Effort normal and breath sounds normal. No respiratory distress. He has no wheezes. He has no rhonchi. He has no rales. He exhibits no tenderness and no crepitus.  Abdominal: Soft. Normal appearance and bowel sounds are  normal. He exhibits no distension. There is no tenderness. There is no rebound and no guarding.  Musculoskeletal: Normal range of motion. He exhibits no edema or tenderness.  Moves all extremities well.   Neurological: He is alert and oriented to person, place, and time. He has normal strength. No cranial nerve deficit.  Equal grips. No pronator drift. No motor weakness.  Skin: Skin is warm, dry and intact. No rash noted. No erythema. No pallor.  Psychiatric: He has a normal mood and affect. His speech is normal and behavior is normal. His mood appears not anxious.  Nursing note and vitals reviewed.    ED Treatments / Results  DIAGNOSTIC STUDIES: Oxygen Saturation is 99% on RA, normal by my interpretation.    Labs (all labs ordered are listed, but only abnormal results are displayed) Labs Reviewed - No data to display  EKG  EKG Interpretation None       Radiology No results found.  Procedures Procedures (including critical care time)  Medications Ordered in ED Medications  sodium chloride 0.9 % bolus 1,000 mL (1,000 mLs Intravenous New Bag/Given 10/06/16 0011)  metoCLOPramide (REGLAN) injection 10 mg (10 mg Intravenous Given 10/06/16 0014)  diphenhydrAMINE (BENADRYL) injection 25 mg (25 mg Intravenous Given 10/06/16 0011)     Initial Impression / Assessment and Plan / ED Course  I have reviewed the triage vital signs and the nursing  notes.  Pertinent labs & imaging results that were available during my care of the patient were reviewed by me and considered in my medical decision making (see chart for details).    COORDINATION OF CARE: 11:50 PM Discussed treatment plan with pt at bedside and pt agreed to plan. I will check the patient's labs. I will start him on IV fluids.  Patient's headaches seem to have a migraine component to it with photophobia and throbbing sharp nature. He was given IV fluids and IV Reglan and Benadryl (migraine cocktail).  Recheck at time of discharge patient states his headache is gone. He feels much improved. We discussed continuing documenting his blood pressures for his PCP. His blood pressure when I saw him at discharge was 127/78. We discussed that pain could make his blood pressure be elevated.  Final Clinical Impressions(s) / ED Diagnoses   Final diagnoses:  Migraine without aura and without status migrainosus, not intractable    Plan discharge  Rolland Porter, MD, FACEP  I personally performed the services described in this documentation, which was scribed in my presence. The recorded information has been reviewed and considered.  Rolland Porter, MD, Barbette Or, MD 10/06/16 [phone]

## 2016-10-05 NOTE — ED Triage Notes (Signed)
Pt c/o headache; pt was seen at ED earlier in the day for same complaint

## 2016-10-05 NOTE — ED Notes (Signed)
Pt made aware to return if symptoms worsen or if any life threatening symptoms occur.   

## 2016-10-06 NOTE — ED Notes (Signed)
Pt sleeping when entered the room. Pt states his head is feeling better.

## 2016-10-06 NOTE — Discharge Instructions (Signed)
Your blood pressure tonight has been normal, at time of discharge it was 127/78. Look at the migraine headache information. Continue to record your blood pressures to show Dr Jeanie Cooks.  Recheck if you get worse again.

## 2016-10-06 NOTE — ED Notes (Signed)
Pt alert & oriented x4, stable gait. Patient given discharge instructions, paperwork & prescription(s). Patient  instructed to stop at the registration desk to finish any additional paperwork. Patient verbalized understanding. Pt left department w/ no further questions. 

## 2016-10-13 ENCOUNTER — Encounter (HOSPITAL_COMMUNITY)
Admission: RE | Admit: 2016-10-13 | Discharge: 2016-10-13 | Disposition: A | Discharge status: Home / Self Care | Payer: Medicaid Other | Source: Ambulatory Visit | Attending: Gastroenterology | Admitting: Gastroenterology

## 2016-10-13 DIAGNOSIS — K5190 Ulcerative colitis, unspecified, without complications: Secondary | ICD-10-CM | POA: Diagnosis not present

## 2016-10-13 MED ORDER — SODIUM CHLORIDE 0.9 % IV SOLN
Freq: Once | INTRAVENOUS | Status: AC
  Administered 2016-10-13: 250 mL via INTRAVENOUS

## 2016-10-13 MED ORDER — SODIUM CHLORIDE 0.9 % IV SOLN
5.0000 mg/kg | Freq: Once | INTRAVENOUS | Status: AC
  Administered 2016-10-13: 500 mg via INTRAVENOUS
  Filled 2016-10-13: qty 50

## 2016-10-15 ENCOUNTER — Encounter (HOSPITAL_COMMUNITY): Payer: Self-pay | Admitting: *Deleted

## 2016-10-15 ENCOUNTER — Emergency Department (HOSPITAL_COMMUNITY): Payer: Medicaid Other

## 2016-10-15 ENCOUNTER — Emergency Department (HOSPITAL_COMMUNITY)
Admission: EM | Admit: 2016-10-15 | Discharge: 2016-10-15 | Disposition: A | Discharge status: Home / Self Care | Payer: Medicaid Other | Attending: Emergency Medicine | Admitting: Emergency Medicine

## 2016-10-15 DIAGNOSIS — F909 Attention-deficit hyperactivity disorder, unspecified type: Secondary | ICD-10-CM | POA: Diagnosis not present

## 2016-10-15 DIAGNOSIS — R220 Localized swelling, mass and lump, head: Secondary | ICD-10-CM

## 2016-10-15 DIAGNOSIS — K08409 Partial loss of teeth, unspecified cause, unspecified class: Secondary | ICD-10-CM

## 2016-10-15 DIAGNOSIS — Z87891 Personal history of nicotine dependence: Secondary | ICD-10-CM | POA: Diagnosis not present

## 2016-10-15 DIAGNOSIS — Z79899 Other long term (current) drug therapy: Secondary | ICD-10-CM | POA: Diagnosis not present

## 2016-10-15 DIAGNOSIS — J45909 Unspecified asthma, uncomplicated: Secondary | ICD-10-CM | POA: Diagnosis not present

## 2016-10-15 DIAGNOSIS — R600 Localized edema: Secondary | ICD-10-CM | POA: Diagnosis not present

## 2016-10-15 LAB — I-STAT CHEM 8, ED
BUN: 9 mg/dL (ref 6–20)
Calcium, Ion: 1.21 mmol/L (ref 1.15–1.40)
Chloride: 105 mmol/L (ref 101–111)
Creatinine, Ser: 0.9 mg/dL (ref 0.61–1.24)
Glucose, Bld: 81 mg/dL (ref 65–99)
HCT: 39 % (ref 39.0–52.0)
Hemoglobin: 13.3 g/dL (ref 13.0–17.0)
Potassium: 3.9 mmol/L (ref 3.5–5.1)
Sodium: 140 mmol/L (ref 135–145)
TCO2: 26 mmol/L (ref 0–100)

## 2016-10-15 MED ORDER — PREDNISOLONE SODIUM PHOSPHATE 15 MG/5ML PO SOLN
60.0000 mg | Freq: Once | ORAL | Status: AC
Start: 2016-10-15 — End: 2016-10-15
  Administered 2016-10-15: 60 mg via ORAL
  Filled 2016-10-15: qty 4

## 2016-10-15 MED ORDER — CLINDAMYCIN PALMITATE HCL 75 MG/5ML PO SOLR
450.0000 mg | Freq: Once | ORAL | Status: AC
  Administered 2016-10-15: 450 mg via ORAL
  Filled 2016-10-15: qty 30

## 2016-10-15 MED ORDER — HYDROCODONE-ACETAMINOPHEN 7.5-325 MG/15ML PO SOLN
15.0000 mL | Freq: Once | ORAL | Status: AC
  Administered 2016-10-15: 15 mL via ORAL
  Filled 2016-10-15: qty 15

## 2016-10-15 MED ORDER — IOPAMIDOL (ISOVUE-300) INJECTION 61%
75.0000 mL | Freq: Once | INTRAVENOUS | Status: AC | PRN
  Administered 2016-10-15: 75 mL via INTRAVENOUS

## 2016-10-15 MED ORDER — PREDNISOLONE 15 MG/5ML PO SYRP: 30.0000 mg | ORAL_SOLUTION | Freq: Every day | ORAL | 0 refills | Status: AC

## 2016-10-15 MED ORDER — CLINDAMYCIN PALMITATE HCL 75 MG/5ML PO SOLR: 450.0000 mg | Freq: Three times a day (TID) | ORAL | 0 refills | Status: AC

## 2016-10-15 NOTE — Discharge Instructions (Signed)
Take the prescriptions as directed.  Elevate your head for the next several days to help with the swelling. Apply ice to the area(s) of discomfort, for 15 minutes at a time, several times per day for the next few days.  Do not fall asleep on an ice pack.  Call your regular oral surgeon tomorrow to schedule a follow up appointment this week.  Return to the Emergency Department immediately if worsening.

## 2016-10-15 NOTE — ED Triage Notes (Signed)
Pt comes in with left side facial swelling. Pt has all 4 wisdom teeth removed around 1000 today. He began swelling around 1630. He called the dentist and they told him this was normal. Pt states it is worsening and feels that the swelling is moving into his throat. No signs of swelling upon triage. VS stable.

## 2016-10-15 NOTE — ED Provider Notes (Signed)
[facility] DEPT Provider Note   CSN: [id number] Arrival date & time: 10/15/16  1727     History   Chief Complaint Chief Complaint  Patient presents with  . Facial Swelling    HPI Wayne Avila is a 26 y.o. male.  HPI  Pt was seen at 2040.  Per pt and his mother, c/o gradual onset and persistence of constant L>R "facial swelling" that began this afternoon. Pt had 4 wisdom teeth removed this morning approximately 1000/1030. Pt called the oral surgeon Dr. Diona Browner and was told "this was normal." Pt then told his mother that he "felt his throat was swelling" so she brought him to the ED for evaluation. Denies fevers, no intra-oral swelling, no sore throat, no neck pain, no hoarse voice, no drooling, no stridor, no SOB/wheezing.   Past Medical History:  Diagnosis Date  . ADHD (attention deficit hyperactivity disorder)   . Anxiety   . Asthma   . Bipolar 1 disorder ([place])   . Clostridium difficile colitis   . Depression   . GERD (gastroesophageal reflux disease)   . HOH (hard of hearing)   . Mental developmental delay   . Seizures [facility])     Patient Active Problem List   Diagnosis Date Noted  . Clostridium difficile colitis 05/24/2016  . Acute diarrhea 05/22/2016  . Abnormal thyroid function test 03/24/2016  . Acute blood loss anemia 03/23/2016  . C. difficile colitis 03/22/2016  . Hypovolemia 03/22/2016  . Bipolar I disorder ([place]) 03/22/2016  . Ulcerative colitis ([place]) 12/29/2015  . CAP (community acquired pneumonia)   . Aspiration pneumonitis ([place]) 11/21/2015  . Bipolar I disorder, most recent episode depressed ([place])   . Bipolar 1 disorder, manic, moderate ([place]) 11/09/2014  . Suicidal ideation   . Major depressive disorder, recurrent episode, moderate ([place])   . Oral thrush 02/23/2014  . Mental developmental delay 11/15/2012    Past Surgical History:  Procedure Laterality Date  . ADENOIDECTOMY    . BIOPSY N/A 12/07/2012   Procedure: GASTRIC BIOPSIES;  Surgeon:  [name], MD;  Location: [facility] ORS;  Service: Endoscopy;  Laterality: N/A;  . BIOPSY N/A 08/30/2013   Procedure: BIOPSY;  Surgeon: [name], MD;  Location: [facility] ORS;  Service: Endoscopy;  Laterality: N/A;  right colon,transverse colon, left colon, rectal biopsies  . BIOPSY  11/20/2015   Procedure: BIOPSY;  Surgeon: [name], MD;  Location[facility];  Service: Endoscopy;;  ileal, right colon biopsy, left colon, rectum  . BIOPSY  04/18/2016   Procedure: BIOPSY;  Surgeon: [name], MD;  Location: [facility];  Service: Endoscopy;;  left descending colon biopsies  . COLONOSCOPY  11/20/2015   Dr. [name]: Severe erythema, edema, ulcers from the anal verge to 20 cm above the anal verge without mucosal sparing, remaining colon and terminal ileum appeared normal. Biopsies from the rectum revealed fulminant active chronic colitis consistent with IBD. Pathology from terminal ileum revealed intramucosal lymphoid aggregates. Remaining colon random biopsies with inactive chronic colitis  . COLONOSCOPY WITH PROPOFOL N/A 08/30/2013   AGT:XMIWOE mucosa in the terminal ileum/COLITIS/ MILD PROCTITIS. 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  Marland Kitchen COLONOSCOPY WITH PROPOFOL N/A 11/20/2015   Dr. 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 ESOPHAGOGASTRODUODENOSCOPY  11/20/2015   Dr. [name]: Normal exam, stomach biopsied and duodenal biopsy.. Benign biopsies.  . ESOPHAGOGASTRODUODENOSCOPY (EGD)  WITH PROPOFOL N/A 12/07/2012   SLF:The mucosa of the esophagus appeared normal Non-erosive gastritis (inflammation) was found in the gastric antrum; multiple biopsies The duodenal mucosa showed no abnormalities in the bulb and second portion of the duodenum  . ESOPHAGOGASTRODUODENOSCOPY (EGD) WITH PROPOFOL N/A 11/20/2015   Dr. [name]: normal with normal biopsies,  negative H.pylori   . ESOPHAGOGASTRODUODENOSCOPY (EGD) WITH PROPOFOL N/A 04/18/2016   Procedure: ESOPHAGOGASTRODUODENOSCOPY (EGD) WITH PROPOFOL;  Surgeon: [name], MD;  [facility];  Service: Endoscopy;  Laterality: N/A;  . FLEXIBLE SIGMOIDOSCOPY N/A 04/18/2016   Procedure: FLEXIBLE SIGMOIDOSCOPY;  Surgeon: [name], MD;  [facility];  Service: Endoscopy;  Laterality: N/A;  . TONSILLECTOMY         Home Medications    Prior to Admission medications   Medication Sig Start Date End Date Taking? Authorizing Provider  albuterol (PROVENTIL HFA;VENTOLIN HFA) 108 (90 Base) MCG/ACT inhaler Inhale 2 puffs into the lungs every 4 (four) hours as needed for wheezing or shortness of breath (and/or cough).    [provider]  albuterol (PROVENTIL) (2.5 MG/3ML) 0.083% nebulizer solution Inhale 2.5 mg into the lungs every 6 (six) hours as needed for wheezing or shortness of breath.     [provider]  atomoxetine (STRATTERA) 100 MG capsule Take 100 mg by mouth daily.     [provider]  cholecalciferol (VITAMIN D) 1000 units tablet Take 1 tablet (1,000 Units total) by mouth daily. START AFTER VITAMIN D 50,000 U TABLETS ARE COMPLETE Patient taking differently: Take 1,000 Units by mouth daily.  04/08/16   Fields, Marga Melnick, MD  divalproex (DEPAKOTE) 500 MG DR tablet Take 1 tablet (500 mg total) by mouth 2 (two) times daily. 07/03/15   [name], [name], MD  EPINEPHrine (EPIPEN 2-PAK) 0.3 mg/0.3 mL IJ SOAJ injection Inject 0.3 mg into the muscle once as needed (for severe allergic reaction).    [provider]  loratadine (CLARITIN) 10 MG tablet Take 10 mg by mouth daily.    [provider]  mometasone (NASONEX) 50 MCG/ACT nasal spray Place 2 sprays into the nose daily. 06/19/16   Triplett, Tammy, PA-C  montelukast (SINGULAIR) 10 MG tablet Take 1 tablet (10 mg total) by mouth at bedtime. 11/13/14   [name], NP  ondansetron  (ZOFRAN) 4 MG tablet Take 1 tablet (4 mg total) by mouth every 6 (six) hours as needed for nausea or vomiting. 07/22/16   Forde Dandy, MD  penicillin v potassium (VEETID) 500 MG tablet Take 1 tablet (500 mg total) by mouth 3 (three) times daily. Patient not taking: Reported on 10/05/2016 08/17/16   Kem Parkinson, PA-C  pseudoephedrine (SUDAFED) 60 MG tablet Take 1 tablet (60 mg total) by mouth every 6 (six) hours as needed for congestion. 06/19/16   Triplett, Tammy, PA-C  sertraline (ZOLOFT) 100 MG tablet Take 1 tablet (100 mg total) by mouth daily. 11/13/14   [name], NP  traMADol (ULTRAM) 50 MG tablet Take 1 tablet (50 mg total) by mouth every 6 (six) hours as needed. Patient not taking: Reported on 10/05/2016 08/17/16   Kem Parkinson, PA-C  traZODone (DESYREL) 50 MG tablet Take 1 tablet by mouth at bedtime. 09/10/16   [provider]    Family History Family History  Problem Relation Age of Onset  . Asthma Mother   . Ulcers Mother   . Bipolar disorder Mother   . ADD / ADHD Father   . Diabetes  Maternal Grandmother   . Diabetes Maternal Grandfather   . Colon cancer Neg Hx   . Liver disease Neg Hx     Social History Social History  Substance Use Topics  . Smoking status: Former Smoker    Packs/day: 0.00    Years: 2.00    Quit date: 10/06/2012  . Smokeless tobacco: Never Used     Comment: Never smoked cigarettes  . Alcohol use No     Comment: denies usage     Allergies   Amoxicillin-pot clavulanate; Ibuprofen; Omeprazole; Pineapple; Strawberry extract; and Tomato   Review of Systems Review of Systems ROS: Statement: All systems negative except as marked or noted in the HPI; Constitutional: Negative for fever and chills. ; ; Eyes: Negative for eye pain, redness and discharge. ; ; ENMT: +facial swelling. Negative for ear pain, hoarseness, nasal congestion, sinus pressure and sore throat. ; ; Cardiovascular: Negative for chest pain, palpitations, diaphoresis, dyspnea  and peripheral edema. ; ; Respiratory: Negative for cough, wheezing and stridor. ; ; Gastrointestinal: Negative for nausea, vomiting, diarrhea, abdominal pain, blood in stool, hematemesis, jaundice and rectal bleeding. . ; ; Genitourinary: Negative for dysuria, flank pain and hematuria. ; ; Musculoskeletal: Negative for back pain and neck pain. Negative for trauma.; ; Skin: Negative for pruritus, rash, abrasions, blisters, bruising and skin lesion.; ; Neuro: Negative for headache, lightheadedness and neck stiffness. Negative for weakness, altered level of consciousness, altered mental status, extremity weakness, paresthesias, involuntary movement, seizure and syncope.       Physical Exam Updated Vital Signs BP (!) 129/92   Pulse 71   Temp 98.4 F (36.9 C) (Oral)   Resp 18   SpO2 100%   Physical Exam 2045: Physical examination:  Nursing notes reviewed; Vital signs and O2 SAT reviewed;  Constitutional: Well developed, Well nourished, Well hydrated, In no acute distress; Head:  Normocephalic, atraumatic. +L>R facial edema.; Eyes: EOMI, PERRL, No scleral icterus; ENMT: Mouth and pharynx normal, Mucous membranes moist. Mouth and pharynx without lesions.  No intra-oral edema. No submandibular or sublingual edema. No hoarse voice, no drooling, no stridor. +mild trismus due to pain in posterior gums/jaw. Surgical sites intact, scant bleeding.; Neck: Supple, Full range of motion, No lymphadenopathy; Cardiovascular: Regular rate and rhythm, No gallop; Respiratory: Breath sounds clear & equal bilaterally, No wheezes.  Speaking full sentences with ease, Normal respiratory effort/excursion; Chest: Nontender, Movement normal; Abdomen: Soft, Nontender, Nondistended, Normal bowel sounds; Genitourinary: No CVA tenderness; Extremities: Pulses normal, No tenderness, No edema, No calf edema or asymmetry.; Neuro: AA&Ox3, Major CN grossly intact.  Speech clear. No gross focal motor or sensory deficits in extremities.;  Skin: Color normal, Warm, Dry.   ED Treatments / Results  Labs (all labs ordered are listed, but only abnormal results are displayed)   EKG  EKG Interpretation None       Radiology   Procedures Procedures (including critical care time)  Medications Ordered in ED Medications  prednisoLONE (ORAPRED) 15 MG/5ML solution 60 mg (not administered)  clindamycin (CLEOCIN) 75 MG/5ML solution 450 mg (not administered)  HYDROcodone-acetaminophen (HYCET) 7.5-325 mg/15 ml solution 15 mL (not administered)  iopamidol (ISOVUE-300) 61 % injection 75 mL (75 mLs Intravenous Contrast Given 10/15/16 2140)     Initial Impression / Assessment and Plan / ED Course  I have reviewed the triage vital signs and the nursing notes.  Pertinent labs & imaging results that were available during my care of the patient were reviewed by me and considered in my medical  decision making (see chart for details).  MDM Reviewed: previous chart, nursing note and vitals Reviewed previous: labs Interpretation: labs and CT scan   Results for orders placed or performed during the hospital encounter of 10/15/16  I-stat Chem 8, ED  Result Value Ref Range   Sodium 140 135 - 145 mmol/L   Potassium 3.9 3.5 - 5.1 mmol/L   Chloride 105 101 - 111 mmol/L   BUN 9 6 - 20 mg/dL   Creatinine, Ser 0.90 0.61 - 1.24 mg/dL   Glucose, Bld 81 65 - 99 mg/dL   Calcium, Ion 1.21 1.15 - 1.40 mmol/L   TCO2 26 0 - 100 mmol/L   Hemoglobin 13.3 13.0 - 17.0 g/dL   HCT 39.0 39.0 - 52.0 %   Ct Soft Tissue Neck W Contrast Result Date: 10/15/2016 CLINICAL DATA:  LEFT-sided facial swelling. Wisdom tooth removal earlier today. EXAM: CT NECK WITH CONTRAST TECHNIQUE: Multidetector CT imaging of the neck was performed using the standard protocol following the bolus administration of intravenous contrast. CONTRAST:  75m ISOVUE-300 IOPAMIDOL (ISOVUE-300) INJECTION 61% COMPARISON:  CT face 10/27/2014. FINDINGS: Pharynx and larynx: No adenoidal or  tonsillar inflammation. Airway midline. No retropharyngeal soft tissue. Normal epiglottis. Normal larynx. Salivary glands: No inflammation, mass, or stone. Thyroid: Normal. Lymph nodes: None enlarged or abnormal density. Vascular: Negative. Limited intracranial: Negative. Visualized orbits: No globe or intraorbital findings. There is extensive subcutaneous air extending upward into the LEFT preseptal periorbital soft tissues. Mastoids and visualized paranasal sinuses: LEFT maxillary sinus is abnormal, with layering fluid of increased attenuation consistent with hemosinus. There is extensive air in the malar soft tissues, masticator space, extending to the retroantral fat, with the air arising from the site of LEFT maxillary wisdom tooth removal. There appears to be a small cortical breach of the sinus floor. No similar findings in the RIGHT maxillary sinus, despite small amount of air in the wisdom tooth bed as well as along the ramus of the RIGHT mandible. Skeleton: No acute or aggressive process. Upper chest: Negative. Other: None. IMPRESSION: LEFT maxillary hemosinus, with extensive air in the soft tissues of the LEFT face. Suspect small cortical breach of the sinus floor at the time of LEFT maxillary wisdom tooth removal. Continued surveillance warranted. Findings discussed with ordering provider. Electronically Signed   By: JStaci RighterM.D.   On: 10/15/2016 22:03    2210:  CT scan as above.  T/C to Oral Surgeon Dr. JHoyt Koch case discussed, including:  HPI, pertinent PM/SHx, VS/PE, dx testing, ED course and treatment:  Pt does not need admission, CT scan findings are normal for wisdom teeth removal, d/c pt, apply ice frequently, start abx (clindamycin OK), and orapred burst, f/u office prn. 1st doses of meds given in ED without N/V, choking/gagging.  Dx and testing, as well as d/w Oral Surgeon, d/w pt and family.  Questions answered.  Verb understanding, agreeable to d/c home with outpt f/u.    Final  Clinical Impressions(s) / ED Diagnoses   Final diagnoses:  None    New Prescriptions New Prescriptions   No medications on file     MFrancine Graven DO 10/18/16 2142

## 2016-11-12 ENCOUNTER — Emergency Department (HOSPITAL_COMMUNITY): Payer: Medicaid Other

## 2016-11-12 ENCOUNTER — Emergency Department (HOSPITAL_COMMUNITY)
Admission: EM | Admit: 2016-11-12 | Discharge: 2016-11-12 | Disposition: A | Discharge status: Home / Self Care | Payer: Medicaid Other | Attending: Emergency Medicine | Admitting: Emergency Medicine

## 2016-11-12 ENCOUNTER — Encounter (HOSPITAL_COMMUNITY): Payer: Self-pay | Admitting: Emergency Medicine

## 2016-11-12 DIAGNOSIS — Z87891 Personal history of nicotine dependence: Secondary | ICD-10-CM | POA: Insufficient documentation

## 2016-11-12 DIAGNOSIS — J45909 Unspecified asthma, uncomplicated: Secondary | ICD-10-CM | POA: Insufficient documentation

## 2016-11-12 DIAGNOSIS — M79601 Pain in right arm: Secondary | ICD-10-CM

## 2016-11-12 DIAGNOSIS — M79621 Pain in right upper arm: Secondary | ICD-10-CM | POA: Diagnosis present

## 2016-11-12 DIAGNOSIS — Z79899 Other long term (current) drug therapy: Secondary | ICD-10-CM | POA: Diagnosis not present

## 2016-11-12 DIAGNOSIS — Z7951 Long term (current) use of inhaled steroids: Secondary | ICD-10-CM | POA: Insufficient documentation

## 2016-11-12 MED ORDER — TRAMADOL HCL 50 MG PO TABS: 50.0000 mg | ORAL_TABLET | Freq: Four times a day (QID) | ORAL | 0 refills | Status: DC | PRN

## 2016-11-12 MED ORDER — ACETAMINOPHEN 325 MG PO TABS
650.0000 mg | ORAL_TABLET | Freq: Once | ORAL | Status: AC
Start: 2016-11-12 — End: 2016-11-12
  Administered 2016-11-12: 650 mg via ORAL
  Filled 2016-11-12: qty 2

## 2016-11-12 MED ORDER — TRAMADOL HCL 50 MG PO TABS
50.0000 mg | ORAL_TABLET | Freq: Once | ORAL | Status: AC
  Administered 2016-11-12: 50 mg via ORAL
  Filled 2016-11-12: qty 1

## 2016-11-12 NOTE — ED Provider Notes (Signed)
[place] DEPT Provider Note   CSN: [id number] Arrival date & time: 11/12/16  1713     History   Chief Complaint Chief Complaint  Patient presents with  . Arm Pain    HPI Wayne Avila is a 26 y.o. male.  The history is provided by the patient.  He woke up this morning with an achy pain in his right arm involving both his upper arm and forearm. He denies any injury and denies any unusual activity. He rates pain at 8/10. He has not taken anything for his pain. Pain is worse with any movement of the arm. He denies any weakness, numbness, tingling.  Past Medical History:  Diagnosis Date  . ADHD (attention deficit hyperactivity disorder)   . Anxiety   . Asthma   . Bipolar 1 disorder ([place])   . Clostridium difficile colitis   . Depression   . GERD (gastroesophageal reflux disease)   . HOH (hard of hearing)   . Mental developmental delay   . Seizures [facility])     Patient Active Problem List   Diagnosis Date Noted  . Clostridium difficile colitis 05/24/2016  . Acute diarrhea 05/22/2016  . Abnormal thyroid function test 03/24/2016  . Acute blood loss anemia 03/23/2016  . C. difficile colitis 03/22/2016  . Hypovolemia 03/22/2016  . Bipolar I disorder ([place]) 03/22/2016  . Ulcerative colitis ([place]) 12/29/2015  . CAP (community acquired pneumonia)   . Aspiration pneumonitis ([place]) 11/21/2015  . Bipolar I disorder, most recent episode depressed ([place])   . Bipolar 1 disorder, manic, moderate ([place]) 11/09/2014  . Suicidal ideation   . Major depressive disorder, recurrent episode, moderate ([place])   . Oral thrush 02/23/2014  . Mental developmental delay 11/15/2012    Past Surgical History:  Procedure Laterality Date  . ADENOIDECTOMY    . BIOPSY N/A 12/07/2012   Procedure: GASTRIC BIOPSIES;  Surgeon: [name], MD;  Location[facility];  Service: Endoscopy;  Laterality: N/A;  . BIOPSY N/A 08/30/2013   Procedure: BIOPSY;  Surgeon: [name], MD;  Location: [facility];  Service:  Endoscopy;  Laterality: N/A;  right colon,transverse colon, left colon, rectal biopsies  . BIOPSY  11/20/2015   Procedure: BIOPSY;  Surgeon: [name], MD;  [facility];  Service: Endoscopy;;  ileal, right colon biopsy, left colon, rectum  . BIOPSY  04/18/2016   Procedure: BIOPSY;  Surgeon: [name], MD;  [facility];  Service: Endoscopy;;  left descending colon biopsies  . COLONOSCOPY  11/20/2015   Dr. [name]: Severe erythema, edema, ulcers from the anal verge to 20 cm above the anal verge without mucosal sparing, remaining colon and terminal ileum appeared normal. Biopsies from the rectum revealed fulminant active chronic colitis consistent with IBD. Pathology from terminal ileum revealed intramucosal lymphoid aggregates. Remaining colon random biopsies with inactive chronic colitis  . COLONOSCOPY WITH PROPOFOL N/A 08/30/2013   MHD:[id number] mucosa in the terminal ileum/COLITIS/ MILD PROCTITIS. 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  Marland Kitchen COLONOSCOPY WITH PROPOFOL N/A 11/20/2015   Dr. 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 ESOPHAGOGASTRODUODENOSCOPY  11/20/2015   Dr. [name]: Normal exam, stomach biopsied and duodenal biopsy.. Benign biopsies.  . ESOPHAGOGASTRODUODENOSCOPY (EGD) WITH PROPOFOL N/A 12/07/2012   SLF:The mucosa of the esophagus appeared normal Non-erosive gastritis (inflammation) was found in the gastric antrum; multiple biopsies The duodenal mucosa showed  no abnormalities in the bulb and second portion of the duodenum  . ESOPHAGOGASTRODUODENOSCOPY (EGD) WITH PROPOFOL N/A 11/20/2015   Dr. [name]: normal with normal biopsies, negative H.pylori   . ESOPHAGOGASTRODUODENOSCOPY (EGD) WITH PROPOFOL N/A 04/18/2016   Procedure: ESOPHAGOGASTRODUODENOSCOPY (EGD) WITH PROPOFOL;  Surgeon: [name], MD;  [facility];  Service: Endoscopy;  Laterality: N/A;  . FLEXIBLE SIGMOIDOSCOPY N/A 04/18/2016   Procedure: FLEXIBLE SIGMOIDOSCOPY;  Surgeon: [name], MD;  [facility];  Service: Endoscopy;  Laterality: N/A;  . TONSILLECTOMY         Home Medications    Prior to Admission medications   Medication Sig Start Date End Date Taking? Authorizing Provider  albuterol (PROVENTIL HFA;VENTOLIN HFA) 108 (90 Base) MCG/ACT inhaler Inhale 2 puffs into the lungs every 4 (four) hours as needed for wheezing or shortness of breath (and/or cough).    [provider]  albuterol (PROVENTIL) (2.5 MG/3ML) 0.083% nebulizer solution Inhale 2.5 mg into the lungs every 6 (six) hours as needed for wheezing or shortness of breath.     [provider]  atomoxetine (STRATTERA) 100 MG capsule Take 100 mg by mouth daily.     [provider]  cholecalciferol (VITAMIN D) 1000 units tablet Take 1 tablet (1,000 Units total) by mouth daily. START AFTER VITAMIN D 50,000 U TABLETS ARE COMPLETE Patient taking differently: Take 1,000 Units by mouth daily.  04/08/16   Fields, Marga Melnick, MD  divalproex (DEPAKOTE) 500 MG DR tablet Take 1 tablet (500 mg total) by mouth 2 (two) times daily. 07/03/15   [name], [name], MD  EPINEPHrine (EPIPEN 2-PAK) 0.3 mg/0.3 mL IJ SOAJ injection Inject 0.3 mg into the muscle once as needed (for severe allergic reaction).    [provider]  loratadine (CLARITIN) 10 MG tablet Take 10 mg by mouth daily.    [provider]  mometasone (NASONEX) 50 MCG/ACT nasal spray Place 2 sprays into the nose daily. 06/19/16   Triplett, Tammy, PA-C  montelukast (SINGULAIR) 10 MG tablet Take 1 tablet (10 mg total) by mouth at bedtime. 11/13/14   [name], NP  ondansetron (ZOFRAN) 4 MG tablet Take 1 tablet (4 mg total) by mouth every 6 (six) hours as needed for nausea or vomiting. 07/22/16   Forde Dandy, MD  penicillin v potassium (VEETID) 500 MG tablet Take 1  tablet (500 mg total) by mouth 3 (three) times daily. Patient not taking: Reported on 10/05/2016 08/17/16   Kem Parkinson, PA-C  pseudoephedrine (SUDAFED) 60 MG tablet Take 1 tablet (60 mg total) by mouth every 6 (six) hours as needed for congestion. 06/19/16   Triplett, Tammy, PA-C  sertraline (ZOLOFT) 100 MG tablet Take 1 tablet (100 mg total) by mouth daily. 11/13/14   [name], NP  traMADol (ULTRAM) 50 MG tablet Take 1 tablet (50 mg total) by mouth every 6 (six) hours as needed. Patient not taking: Reported on 10/05/2016 08/17/16   Kem Parkinson, PA-C  traZODone (DESYREL) 50 MG tablet Take 1 tablet by mouth at bedtime. 09/10/16   [provider]    Family History Family History  Problem Relation Age of Onset  . Asthma Mother   . Ulcers Mother   . Bipolar disorder Mother   . ADD / ADHD Father   . Diabetes Maternal Grandmother   . Diabetes Maternal Grandfather   . Colon cancer Neg Hx   . Liver disease Neg Hx     Social History  Social History  Substance Use Topics  . Smoking status: Former Smoker    Packs/day: 0.00    Years: 2.00    Quit date: 10/06/2012  . Smokeless tobacco: Never Used     Comment: Never smoked cigarettes  . Alcohol use No     Comment: denies usage     Allergies   Amoxicillin-pot clavulanate; Ibuprofen; Omeprazole; Pineapple; Strawberry extract; and Tomato   Review of Systems Review of Systems  All other systems reviewed and are negative.    Physical Exam Updated Vital Signs BP 124/79 (BP Location: Left Arm)   Pulse 97   Temp 98.6 F (37 C) (Oral)   Resp 16   Ht 5' 8"  (1.727 m)   Wt 103.4 kg (228 lb)   SpO2 99%   BMI 34.67 kg/m   Physical Exam  Nursing note and vitals reviewed.  26 year old male, resting comfortably and in no acute distress. Vital signs are normal. Oxygen saturation is 99%, which is normal. Head is normocephalic and atraumatic. PERRLA, EOMI. Oropharynx is clear. Neck is nontender and supple without adenopathy  or JVD. Back is nontender and there is no CVA tenderness. Lungs are clear without rales, wheezes, or rhonchi. Chest is nontender. Heart has regular rate and rhythm without murmur. Abdomen is soft, flat, nontender without masses or hepatosplenomegaly and peristalsis is normoactive. Extremities have no cyanosis or edema, full range of motion is present, although he does complain of pain with passive range of motion of the right arm. Right arm has tenderness throughout the soft tissues of the upper arm and forearm. No tenderness of the hand. Radial and ulnar pulses are strong, capillary refill is prompt. There is normal sensation and normal motor strength. Skin is warm and dry without rash. Neurologic: Mental status is normal, cranial nerves are intact, there are no motor or sensory deficits.  ED Treatments / Results   Radiology Dg Forearm Right  Result Date: 11/12/2016 CLINICAL DATA:  Woke up with arm pain EXAM: RIGHT FOREARM - 2 VIEW COMPARISON:  None. FINDINGS: There is no evidence of fracture or other focal bone lesions. Soft tissues are unremarkable. IMPRESSION: Negative. Electronically Signed   By: Donavan Foil M.D.   On: 11/12/2016 19:43   Dg Humerus Right  Result Date: 11/12/2016 CLINICAL DATA:  Mid humeral pain EXAM: RIGHT HUMERUS - 2+ VIEW COMPARISON:  None. FINDINGS: There is no evidence of fracture or other focal bone lesions. Soft tissues are unremarkable. IMPRESSION: Negative. Electronically Signed   By: Donavan Foil M.D.   On: 11/12/2016 19:43    Procedures Procedures (including critical care time)  Medications Ordered in ED Medications  traMADol (ULTRAM) tablet 50 mg (not administered)  acetaminophen (TYLENOL) tablet 650 mg (650 mg Oral Given 11/12/16 1900)     Initial Impression / Assessment and Plan / ED Course  I have reviewed the triage vital signs and the nursing notes.  Pertinent imaging results that were available during my care of the patient were reviewed by me  and considered in my medical decision making (see chart for details).  Right arm pain of uncertain cause. No evidence of neurologic or vascular injury. He will be sent for x-rays. Of note, he has a history of ulcerative colitis and cannot take NSAIDs. He is given a dose of acetaminophen in the ED.  X-rays are unremarkable. Cause for pain is not clear, but no evidence of serious pathology. He is discharged with prescription for tramadol and advised to return should  symptoms worsen.  Final Clinical Impressions(s) / ED Diagnoses   Final diagnoses:  Right arm pain    New Prescriptions Current Discharge Medication List       Delora Fuel, MD 58/06/38 2001

## 2016-11-12 NOTE — ED Triage Notes (Signed)
Woke up with right arm pain, denies injury

## 2016-11-12 NOTE — ED Notes (Signed)
Patient transported to X-ray 

## 2016-11-12 NOTE — Discharge Instructions (Signed)
Apply ice to areas that are painful. Take acetaminophen (Tylenol) for less severe pain. Return if symptoms are getting worse.

## 2016-11-13 ENCOUNTER — Encounter (HOSPITAL_COMMUNITY): Payer: Self-pay | Admitting: Emergency Medicine

## 2016-11-13 ENCOUNTER — Emergency Department (HOSPITAL_COMMUNITY)
Admission: EM | Admit: 2016-11-13 | Discharge: 2016-11-13 | Disposition: A | Discharge status: Home Health | Payer: Medicaid Other | Attending: Emergency Medicine | Admitting: Emergency Medicine

## 2016-11-13 DIAGNOSIS — W57XXXA Bitten or stung by nonvenomous insect and other nonvenomous arthropods, initial encounter: Secondary | ICD-10-CM | POA: Insufficient documentation

## 2016-11-13 DIAGNOSIS — R21 Rash and other nonspecific skin eruption: Secondary | ICD-10-CM

## 2016-11-13 DIAGNOSIS — Y999 Unspecified external cause status: Secondary | ICD-10-CM | POA: Insufficient documentation

## 2016-11-13 DIAGNOSIS — Y929 Unspecified place or not applicable: Secondary | ICD-10-CM | POA: Insufficient documentation

## 2016-11-13 DIAGNOSIS — J45909 Unspecified asthma, uncomplicated: Secondary | ICD-10-CM | POA: Insufficient documentation

## 2016-11-13 DIAGNOSIS — S80861A Insect bite (nonvenomous), right lower leg, initial encounter: Secondary | ICD-10-CM | POA: Insufficient documentation

## 2016-11-13 DIAGNOSIS — Z87891 Personal history of nicotine dependence: Secondary | ICD-10-CM | POA: Diagnosis not present

## 2016-11-13 DIAGNOSIS — F909 Attention-deficit hyperactivity disorder, unspecified type: Secondary | ICD-10-CM | POA: Diagnosis not present

## 2016-11-13 DIAGNOSIS — Y939 Activity, unspecified: Secondary | ICD-10-CM | POA: Diagnosis not present

## 2016-11-13 MED ORDER — DOXYCYCLINE HYCLATE 100 MG PO CAPS: 100.0000 mg | ORAL_CAPSULE | Freq: Two times a day (BID) | ORAL | 0 refills | Status: DC

## 2016-11-13 NOTE — ED Triage Notes (Signed)
Patient states he was bit by a tick in the right calf this morning and now has a rash noted to right lower leg. Complains of itching to rash site.

## 2016-11-13 NOTE — ED Provider Notes (Signed)
[facility] DEPT Provider Note   CSN: [id number] Arrival date & time: 11/13/16  1659     History   Chief Complaint Chief Complaint  Patient presents with  . Rash    HPI Wayne Avila is a 26 y.o. male presenting for evaluation of tick bite, rash and fever which started today. He removed an embedded tick from his right medial calf this morning which he states was firmly attached and starting to fatten prior to removal.  He has since reported subjective fever for which he took tylenol and now has a nonpruritic rash distal to the site of the tick bite.  He denies itching, pain or drainage from the rash and has had no other treatment prior to arrival.  Additionally denies n/v/ abdominal pain, headache, neck stiffness.  The history is provided by the patient and a parent.    Past Medical History:  Diagnosis Date  . ADHD (attention deficit hyperactivity disorder)   . Anxiety   . Asthma   . Bipolar 1 disorder ([place])   . Clostridium difficile colitis   . Depression   . GERD (gastroesophageal reflux disease)   . HOH (hard of hearing)   . Mental developmental delay   . Seizures [facility])     Patient Active Problem List   Diagnosis Date Noted  . Clostridium difficile colitis 05/24/2016  . Acute diarrhea 05/22/2016  . Abnormal thyroid function test 03/24/2016  . Acute blood loss anemia 03/23/2016  . C. difficile colitis 03/22/2016  . Hypovolemia 03/22/2016  . Bipolar I disorder ([place]) 03/22/2016  . Ulcerative colitis ([place]) 12/29/2015  . CAP (community acquired pneumonia)   . Aspiration pneumonitis ([place]) 11/21/2015  . Bipolar I disorder, most recent episode depressed ([place])   . Bipolar 1 disorder, manic, moderate ([place]) 11/09/2014  . Suicidal ideation   . Major depressive disorder, recurrent episode, moderate ([place])   . Oral thrush 02/23/2014  . Mental developmental delay 11/15/2012    Past Surgical History:  Procedure Laterality Date  . ADENOIDECTOMY    . BIOPSY N/A 12/07/2012   Procedure: GASTRIC BIOPSIES;  Surgeon: [name], MD;  Location: [facility] ORS;  Service: Endoscopy;  Laterality: N/A;  . BIOPSY N/A 08/30/2013   Procedure: BIOPSY;  Surgeon: [name], MD;  Location: [facility];  Service: Endoscopy;  Laterality: N/A;  right colon,transverse colon, left colon, rectal biopsies  . BIOPSY  11/20/2015   Procedure: BIOPSY;  Surgeon: [name], MD;  Location: [facility];  Service: Endoscopy;;  ileal, right colon biopsy, left colon, rectum  . BIOPSY  04/18/2016   Procedure: BIOPSY;  Surgeon: [name], MD;  Location: [facility];  Service: Endoscopy;;  left descending colon biopsies  . COLONOSCOPY  11/20/2015   Dr. [name]: Severe erythema, edema, ulcers from the anal verge to 20 cm above the anal verge without mucosal sparing, remaining colon and terminal ileum appeared normal. Biopsies from the rectum revealed fulminant active chronic colitis consistent with IBD. Pathology from terminal ileum revealed intramucosal lymphoid aggregates. Remaining colon random biopsies with inactive chronic colitis  . COLONOSCOPY WITH PROPOFOL N/A 08/30/2013   YQM:VHQION mucosa in the terminal ileum/COLITIS/ MILD PROCTITIS. 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  Marland Kitchen COLONOSCOPY WITH PROPOFOL N/A 11/20/2015   Dr. 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 ESOPHAGOGASTRODUODENOSCOPY  11/20/2015   Dr. [name]: Normal exam, stomach biopsied and  duodenal biopsy.. Benign biopsies.  . ESOPHAGOGASTRODUODENOSCOPY (EGD) WITH PROPOFOL N/A 12/07/2012   SLF:The mucosa of the esophagus appeared normal Non-erosive gastritis (inflammation) was found in the gastric antrum; multiple biopsies The duodenal mucosa showed no abnormalities in the bulb and second portion of the duodenum  . ESOPHAGOGASTRODUODENOSCOPY (EGD) WITH PROPOFOL N/A 11/20/2015   Dr.  [name]: normal with normal biopsies, negative H.pylori   . ESOPHAGOGASTRODUODENOSCOPY (EGD) WITH PROPOFOL N/A 04/18/2016   Procedure: ESOPHAGOGASTRODUODENOSCOPY (EGD) WITH PROPOFOL;  Surgeon: [name], MD;  [facility];  Service: Endoscopy;  Laterality: N/A;  . FLEXIBLE SIGMOIDOSCOPY N/A 04/18/2016   Procedure: FLEXIBLE SIGMOIDOSCOPY;  Surgeon: [name], MD;  Location[facility];  Service: Endoscopy;  Laterality: N/A;  . TONSILLECTOMY         Home Medications    Prior to Admission medications   Medication Sig Start Date End Date Taking? Authorizing Provider  albuterol (PROVENTIL HFA;VENTOLIN HFA) 108 (90 Base) MCG/ACT inhaler Inhale 2 puffs into the lungs every 4 (four) hours as needed for wheezing or shortness of breath (and/or cough).    [provider]  albuterol (PROVENTIL) (2.5 MG/3ML) 0.083% nebulizer solution Inhale 2.5 mg into the lungs every 6 (six) hours as needed for wheezing or shortness of breath.     [provider]  atomoxetine (STRATTERA) 100 MG capsule Take 100 mg by mouth daily.     [provider]  cholecalciferol (VITAMIN D) 1000 units tablet Take 1 tablet (1,000 Units total) by mouth daily. START AFTER VITAMIN D 50,000 U TABLETS ARE COMPLETE Patient taking differently: Take 1,000 Units by mouth daily.  04/08/16   Fields, Marga Melnick, MD  divalproex (DEPAKOTE) 500 MG DR tablet Take 1 tablet (500 mg total) by mouth 2 (two) times daily. 07/03/15   [name], [name], MD  doxycycline (VIBRAMYCIN) 100 MG capsule Take 1 capsule (100 mg total) by mouth 2 (two) times daily. 11/13/16   Evalee Jefferson, PA-C  EPINEPHrine (EPIPEN 2-PAK) 0.3 mg/0.3 mL IJ SOAJ injection Inject 0.3 mg into the muscle once as needed (for severe allergic reaction).    [provider]  loratadine (CLARITIN) 10 MG tablet Take 10 mg by mouth daily.    [provider]  mometasone (NASONEX) 50 MCG/ACT nasal spray Place 2 sprays into the nose  daily. 06/19/16   Triplett, Tammy, PA-C  montelukast (SINGULAIR) 10 MG tablet Take 1 tablet (10 mg total) by mouth at bedtime. 11/13/14   [name], NP  ondansetron (ZOFRAN) 4 MG tablet Take 1 tablet (4 mg total) by mouth every 6 (six) hours as needed for nausea or vomiting. 07/22/16   Forde Dandy, MD  pseudoephedrine (SUDAFED) 60 MG tablet Take 1 tablet (60 mg total) by mouth every 6 (six) hours as needed for congestion. 06/19/16   Triplett, Tammy, PA-C  sertraline (ZOLOFT) 100 MG tablet Take 1 tablet (100 mg total) by mouth daily. 11/13/14   [name], NP  traMADol (ULTRAM) 50 MG tablet Take 1 tablet (50 mg total) by mouth every 6 (six) hours as needed. 06/14/58   Delora Fuel, MD  traZODone (DESYREL) 50 MG tablet Take 1 tablet by mouth at bedtime. 09/10/16   [provider]    Family History Family History  Problem Relation Age of Onset  . Asthma Mother   . Ulcers Mother   . Bipolar disorder Mother   . ADD / ADHD Father   . Diabetes Maternal Grandmother   . Diabetes  Maternal Grandfather   . Colon cancer Neg Hx   . Liver disease Neg Hx     Social History Social History  Substance Use Topics  . Smoking status: Former Smoker    Packs/day: 0.00    Years: 2.00    Quit date: 10/06/2012  . Smokeless tobacco: Never Used     Comment: Never smoked cigarettes  . Alcohol use No     Comment: denies usage     Allergies   Amoxicillin-pot clavulanate; Ibuprofen; Omeprazole; Pineapple; Strawberry extract; and Tomato   Review of Systems Review of Systems  Constitutional: Positive for fever. Negative for chills.  Respiratory: Negative for shortness of breath and wheezing.   Cardiovascular: Negative for chest pain.  Gastrointestinal: Negative for abdominal pain, nausea and vomiting.  Skin: Positive for rash. Negative for color change.  Neurological: Negative for numbness.     Physical Exam Updated Vital Signs BP 136/81 (BP Location: Right Arm)   Pulse 99   Temp 98.2 F  (36.8 C) (Oral)   Resp 18   Ht 5' 8"  (1.727 m)   Wt 103.4 kg (228 lb)   SpO2 100%   BMI 34.67 kg/m   Physical Exam  Constitutional: He appears well-developed and well-nourished. No distress.  HENT:  Head: Normocephalic.  Neck: Normal range of motion. Neck supple.  Cardiovascular: Normal rate.   Pulmonary/Chest: Effort normal. He has no wheezes.  Musculoskeletal: Normal range of motion. He exhibits no edema or tenderness.  Neurological: He is alert.  Skin: Rash noted.  Small erythematous nodule at tick bite site right medial upper calf.  No bullseye lesion.  There are 4 mall, raised papular lesions  Distal to the bite site and around the ankle.  No excoriations, no drainage, pustules or red streaking.  No rash on palms or soles of feet.     ED Treatments / Results  Labs (all labs ordered are listed, but only abnormal results are displayed) Labs Reviewed - No data to display  EKG  EKG Interpretation None       Radiology Dg Forearm Right  Result Date: 11/12/2016 CLINICAL DATA:  Woke up with arm pain EXAM: RIGHT FOREARM - 2 VIEW COMPARISON:  None. FINDINGS: There is no evidence of fracture or other focal bone lesions. Soft tissues are unremarkable. IMPRESSION: Negative. Electronically Signed   By: Donavan Foil M.D.   On: 11/12/2016 19:43   Dg Humerus Right  Result Date: 11/12/2016 CLINICAL DATA:  Mid humeral pain EXAM: RIGHT HUMERUS - 2+ VIEW COMPARISON:  None. FINDINGS: There is no evidence of fracture or other focal bone lesions. Soft tissues are unremarkable. IMPRESSION: Negative. Electronically Signed   By: Donavan Foil M.D.   On: 11/12/2016 19:43    Procedures Procedures (including critical care time)  Medications Ordered in ED Medications - No data to display   Initial Impression / Assessment and Plan / ED Course  I have reviewed the triage vital signs and the nursing notes.  Pertinent labs & imaging results that were available during my care of the patient  were reviewed by me and considered in my medical decision making (see chart for details).     Pt with tick bite exposure, reported fever tx with tylenol prior to arrival, and new rash which is not classic for RMSF rash. However, given exposure will provide coverage with doxycycline.  Advised f/u with pcp prn for any worsened sx,  Tick information printed for pt.  Final Clinical Impressions(s) / ED Diagnoses  Final diagnoses:  Tick bite, initial encounter  Rash    New Prescriptions New Prescriptions   DOXYCYCLINE (VIBRAMYCIN) 100 MG CAPSULE    Take 1 capsule (100 mg total) by mouth 2 (two) times daily.     Evalee Jefferson, PA-C 11/13/16 1729    Forde Dandy, MD 11/14/16 [phone]

## 2016-11-13 NOTE — Discharge Instructions (Signed)
You are being covered with the antibiotic as discussed in the event the tick that bit you is the source of todays rash.  Make sure you take the entire course of this antibiotic.  See the information above about symptoms suggesting tick infection.  See your doctor for a recheck for any new or worsened symptoms.

## 2016-12-02 ENCOUNTER — Emergency Department (HOSPITAL_COMMUNITY)
Admission: EM | Admit: 2016-12-02 | Discharge: 2016-12-02 | Disposition: A | Discharge status: Home / Self Care | Payer: Medicaid Other | Attending: Emergency Medicine | Admitting: Emergency Medicine

## 2016-12-02 ENCOUNTER — Encounter (HOSPITAL_COMMUNITY): Payer: Self-pay | Admitting: Emergency Medicine

## 2016-12-02 ENCOUNTER — Emergency Department (HOSPITAL_COMMUNITY): Payer: Medicaid Other

## 2016-12-02 DIAGNOSIS — J45909 Unspecified asthma, uncomplicated: Secondary | ICD-10-CM | POA: Diagnosis not present

## 2016-12-02 DIAGNOSIS — Z880 Allergy status to penicillin: Secondary | ICD-10-CM | POA: Insufficient documentation

## 2016-12-02 DIAGNOSIS — Z79899 Other long term (current) drug therapy: Secondary | ICD-10-CM | POA: Diagnosis not present

## 2016-12-02 DIAGNOSIS — R1031 Right lower quadrant pain: Secondary | ICD-10-CM | POA: Insufficient documentation

## 2016-12-02 DIAGNOSIS — F17290 Nicotine dependence, other tobacco product, uncomplicated: Secondary | ICD-10-CM | POA: Diagnosis not present

## 2016-12-02 DIAGNOSIS — R1012 Left upper quadrant pain: Secondary | ICD-10-CM

## 2016-12-02 LAB — CBC WITH DIFFERENTIAL/PLATELET
Basophils Absolute: 0 10*3/uL (ref 0.0–0.1)
Basophils Relative: 0 %
Eosinophils Absolute: 0 10*3/uL (ref 0.0–0.7)
Eosinophils Relative: 0 %
HCT: 40.9 % (ref 39.0–52.0)
Hemoglobin: 13.6 g/dL (ref 13.0–17.0)
Lymphocytes Relative: 29 %
Lymphs Abs: 2.1 10*3/uL (ref 0.7–4.0)
MCH: 26.7 pg (ref 26.0–34.0)
MCHC: 33.3 g/dL (ref 30.0–36.0)
MCV: 80.4 fL (ref 78.0–100.0)
Monocytes Absolute: 0.5 10*3/uL (ref 0.1–1.0)
Monocytes Relative: 7 %
Neutro Abs: 4.7 10*3/uL (ref 1.7–7.7)
Neutrophils Relative %: 64 %
Platelets: 206 10*3/uL (ref 150–400)
RBC: 5.09 MIL/uL (ref 4.22–5.81)
RDW: 16.3 % — ABNORMAL HIGH (ref 11.5–15.5)
WBC: 7.4 10*3/uL (ref 4.0–10.5)

## 2016-12-02 LAB — COMPREHENSIVE METABOLIC PANEL
ALT: 572 U/L — ABNORMAL HIGH (ref 17–63)
AST: 543 U/L — ABNORMAL HIGH (ref 15–41)
Albumin: 3.8 g/dL (ref 3.5–5.0)
Alkaline Phosphatase: 83 U/L (ref 38–126)
Anion gap: 10 (ref 5–15)
BUN: 7 mg/dL (ref 6–20)
CO2: 24 mmol/L (ref 22–32)
Calcium: 8.9 mg/dL (ref 8.9–10.3)
Chloride: 101 mmol/L (ref 101–111)
Creatinine, Ser: 0.85 mg/dL (ref 0.61–1.24)
GFR calc Af Amer: >60 mL/min (ref >60)
GFR calc non Af Amer: >60 mL/min (ref >60)
Glucose, Bld: 81 mg/dL (ref 65–99)
Potassium: 3.7 mmol/L (ref 3.5–5.1)
Sodium: 135 mmol/L (ref 135–145)
Total Bilirubin: 0.7 mg/dL (ref 0.3–1.2)
Total Protein: 8.1 g/dL (ref 6.5–8.1)

## 2016-12-02 MED ORDER — SODIUM CHLORIDE 0.9 % IV BOLUS (SEPSIS)
1000.0000 mL | Freq: Once | INTRAVENOUS | Status: AC
  Administered 2016-12-02: 1000 mL via INTRAVENOUS

## 2016-12-02 MED ORDER — IOPAMIDOL (ISOVUE-300) INJECTION 61%
100.0000 mL | Freq: Once | INTRAVENOUS | Status: AC | PRN
  Administered 2016-12-02: 100 mL via INTRAVENOUS

## 2016-12-02 MED ORDER — HYDROMORPHONE HCL 1 MG/ML IJ SOLN
0.5000 mg | Freq: Once | INTRAMUSCULAR | Status: AC
  Administered 2016-12-02: 0.5 mg via INTRAVENOUS
  Filled 2016-12-02: qty 1

## 2016-12-02 MED ORDER — ONDANSETRON HCL 4 MG/2ML IJ SOLN
4.0000 mg | Freq: Once | INTRAMUSCULAR | Status: AC
  Administered 2016-12-02: 4 mg via INTRAVENOUS
  Filled 2016-12-02: qty 2

## 2016-12-02 MED ORDER — ONDANSETRON 4 MG PO TBDP: ORAL_TABLET | ORAL | 0 refills | Status: DC

## 2016-12-02 NOTE — ED Triage Notes (Signed)
Pt here for eval of abd pain for over a week with more frequent bowel movements.

## 2016-12-02 NOTE — ED Provider Notes (Signed)
[facility] DEPT Provider Note   CSN: [id number] Arrival date & time: 12/02/16  1713     History   Chief Complaint Chief Complaint  Patient presents with  . Abdominal Pain    HPI Wayne Avila is a 26 y.o. male.  Patient has had abdominal pain off and on for last couple days with some loose stools. No vomiting.   The history is provided by the patient and a relative.  Abdominal Pain   This is a new problem. The current episode started 2 days ago. The problem occurs constantly. The problem has not changed since onset.The pain is associated with an unknown factor. The pain is located in the RLQ. The quality of the pain is dull. The pain is at a severity of 5/10. The pain is moderate. Associated symptoms include anorexia. Pertinent negatives include diarrhea, frequency, hematuria and headaches.    Past Medical History:  Diagnosis Date  . ADHD (attention deficit hyperactivity disorder)   . Anxiety   . Asthma   . Bipolar 1 disorder ([place])   . Clostridium difficile colitis   . Depression   . GERD (gastroesophageal reflux disease)   . HOH (hard of hearing)   . Mental developmental delay   . Seizures [facility])     Patient Active Problem List   Diagnosis Date Noted  . Clostridium difficile colitis 05/24/2016  . Acute diarrhea 05/22/2016  . Abnormal thyroid function test 03/24/2016  . Acute blood loss anemia 03/23/2016  . C. difficile colitis 03/22/2016  . Hypovolemia 03/22/2016  . Bipolar I disorder ([place]) 03/22/2016  . Ulcerative colitis ([place]) 12/29/2015  . CAP (community acquired pneumonia)   . Aspiration pneumonitis ([place]) 11/21/2015  . Bipolar I disorder, most recent episode depressed ([place])   . Bipolar 1 disorder, manic, moderate ([place]) 11/09/2014  . Suicidal ideation   . Major depressive disorder, recurrent episode, moderate ([place])   . Oral thrush 02/23/2014  . Mental developmental delay 11/15/2012    Past Surgical History:  Procedure Laterality Date  .  ADENOIDECTOMY    . BIOPSY N/A 12/07/2012   Procedure: GASTRIC BIOPSIES;  Surgeon: [name], MD;  Location: [facility] OR[facility];  Service: Endoscopy;  Laterality: N/A;  . BIOPSY N/A 08/30/2013   Procedure: BIOPSY;  Surgeon: [name], MD;  Location: [facility] ORS;  Service: Endoscopy;  Laterality: N/A;  right colon,transverse colon, left colon, rectal biopsies  . BIOPSY  11/20/2015   Procedure: BIOPSY;  Surgeon: [name], MD;  Location[facility];  Service: Endoscopy;;  ileal, right colon biopsy, left colon, rectum  . BIOPSY  04/18/2016   Procedure: BIOPSY;  Surgeon: [name], MD;  [facility];  Service: Endoscopy;;  left descending colon biopsies  . COLONOSCOPY  11/20/2015   Dr. [name]: Severe erythema, edema, ulcers from the anal verge to 20 cm above the anal verge without mucosal sparing, remaining colon and terminal ileum appeared normal. Biopsies from the rectum revealed fulminant active chronic colitis consistent with IBD. Pathology from terminal ileum revealed intramucosal lymphoid aggregates. Remaining colon random biopsies with inactive chronic colitis  . COLONOSCOPY WITH PROPOFOL N/A 08/30/2013   QXI:HWTUUE mucosa in the terminal ileum/COLITIS/ MILD PROCTITIS. 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  Marland Kitchen COLONOSCOPY WITH PROPOFOL N/A 11/20/2015   Dr. 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 ESOPHAGOGASTRODUODENOSCOPY  11/20/2015   Dr. [name]: Normal  exam, stomach biopsied and duodenal biopsy.. Benign biopsies.  . ESOPHAGOGASTRODUODENOSCOPY (EGD) WITH PROPOFOL N/A 12/07/2012   SLF:The mucosa of the esophagus appeared normal Non-erosive gastritis (inflammation) was found in the gastric antrum; multiple biopsies The duodenal mucosa showed no abnormalities in the bulb and second portion of the duodenum  . ESOPHAGOGASTRODUODENOSCOPY  (EGD) WITH PROPOFOL N/A 11/20/2015   Dr. [name]: normal with normal biopsies, negative H.pylori   . ESOPHAGOGASTRODUODENOSCOPY (EGD) WITH PROPOFOL N/A 04/18/2016   Procedure: ESOPHAGOGASTRODUODENOSCOPY (EGD) WITH PROPOFOL;  Surgeon: [name], MD;  [facility];  Service: Endoscopy;  Laterality: N/A;  . FLEXIBLE SIGMOIDOSCOPY N/A 04/18/2016   Procedure: FLEXIBLE SIGMOIDOSCOPY;  Surgeon: [name], MD;  [facility];  Service: Endoscopy;  Laterality: N/A;  . TONSILLECTOMY         Home Medications    Prior to Admission medications   Medication Sig Start Date End Date Taking? Authorizing Provider  albuterol (PROVENTIL HFA;VENTOLIN HFA) 108 (90 Base) MCG/ACT inhaler Inhale 2 puffs into the lungs every 4 (four) hours as needed for wheezing or shortness of breath (and/or cough).    [provider]  albuterol (PROVENTIL) (2.5 MG/3ML) 0.083% nebulizer solution Inhale 2.5 mg into the lungs every 6 (six) hours as needed for wheezing or shortness of breath.     [provider]  atomoxetine (STRATTERA) 100 MG capsule Take 100 mg by mouth daily.     [provider]  cholecalciferol (VITAMIN D) 1000 units tablet Take 1 tablet (1,000 Units total) by mouth daily. START AFTER VITAMIN D 50,000 U TABLETS ARE COMPLETE Patient taking differently: Take 1,000 Units by mouth daily.  04/08/16   Fields, Marga Melnick, MD  divalproex (DEPAKOTE) 500 MG DR tablet Take 1 tablet (500 mg total) by mouth 2 (two) times daily. 07/03/15   [name], [name], MD  doxycycline (VIBRAMYCIN) 100 MG capsule Take 1 capsule (100 mg total) by mouth 2 (two) times daily. 11/13/16   Evalee Jefferson, PA-C  EPINEPHrine (EPIPEN 2-PAK) 0.3 mg/0.3 mL IJ SOAJ injection Inject 0.3 mg into the muscle once as needed (for severe allergic reaction).    [provider]  loratadine (CLARITIN) 10 MG tablet Take 10 mg by mouth daily.    [provider]  mometasone (NASONEX) 50 MCG/ACT nasal  spray Place 2 sprays into the nose daily. 06/19/16   Triplett, Tammy, PA-C  montelukast (SINGULAIR) 10 MG tablet Take 1 tablet (10 mg total) by mouth at bedtime. 11/13/14   [name], NP  ondansetron (ZOFRAN ODT) 4 MG disintegrating tablet 20m ODT q4 hours prn nausea/vomit 12/02/16   ZMilton Ferguson MD  ondansetron (ZOFRAN) 4 MG tablet Take 1 tablet (4 mg total) by mouth every 6 (six) hours as needed for nausea or vomiting. 07/22/16   LForde Dandy MD  pseudoephedrine (SUDAFED) 60 MG tablet Take 1 tablet (60 mg total) by mouth every 6 (six) hours as needed for congestion. 06/19/16   Triplett, Tammy, PA-C  sertraline (ZOLOFT) 100 MG tablet Take 1 tablet (100 mg total) by mouth daily. 11/13/14   DNiel Hummer NP  traMADol (ULTRAM) 50 MG tablet Take 1 tablet (50 mg total) by mouth every 6 (six) hours as needed. 61/2/87  GDelora Fuel MD  traZODone (DESYREL) 50 MG tablet Take 1 tablet by mouth at bedtime. 09/10/16   [provider]    Family History Family History  Problem Relation Age of Onset  . Asthma Mother   . Ulcers  Mother   . Bipolar disorder Mother   . ADD / ADHD Father   . Diabetes Maternal Grandmother   . Diabetes Maternal Grandfather   . Colon cancer Neg Hx   . Liver disease Neg Hx     Social History Social History  Substance Use Topics  . Smoking status: Former Smoker    Packs/day: 0.00    Years: 2.00    Quit date: 10/06/2012  . Smokeless tobacco: Never Used     Comment: Never smoked cigarettes  . Alcohol use No     Comment: denies usage     Allergies   Amoxicillin-pot clavulanate; Ibuprofen; Omeprazole; Pineapple; Strawberry extract; and Tomato   Review of Systems Review of Systems  Constitutional: Negative for appetite change and fatigue.  HENT: Negative for congestion, ear discharge and sinus pressure.   Eyes: Negative for discharge.  Respiratory: Negative for cough.   Cardiovascular: Negative for chest pain.  Gastrointestinal: Positive for abdominal  pain and anorexia. Negative for diarrhea.  Genitourinary: Negative for frequency and hematuria.  Musculoskeletal: Negative for back pain.  Skin: Negative for rash.  Neurological: Negative for seizures and headaches.  Psychiatric/Behavioral: Negative for hallucinations.     Physical Exam Updated Vital Signs BP 137/89 (BP Location: Right Arm)   Pulse 87   Temp 98.1 F (36.7 C) (Oral)   Resp 16   Ht 5' 8"  (1.727 m)   Wt 104.9 kg (231 lb 4.8 oz)   SpO2 100%   BMI 35.17 kg/m   Physical Exam  Constitutional: He is oriented to person, place, and time. He appears well-developed.  HENT:  Head: Normocephalic.  Eyes: Conjunctivae and EOM are normal. No scleral icterus.  Neck: Neck supple. No thyromegaly present.  Cardiovascular: Normal rate and regular rhythm.  Exam reveals no gallop and no friction rub.   No murmur heard. Pulmonary/Chest: No stridor. He has no wheezes. He has no rales. He exhibits no tenderness.  Abdominal: He exhibits no distension. There is tenderness. There is no rebound.  Mild tender rlq  Musculoskeletal: Normal range of motion. He exhibits no edema.  Lymphadenopathy:    He has no cervical adenopathy.  Neurological: He is oriented to person, place, and time. He exhibits normal muscle tone. Coordination normal.  Skin: No rash noted. No erythema.  Psychiatric: He has a normal mood and affect. His behavior is normal.     ED Treatments / Results  Labs (all labs ordered are listed, but only abnormal results are displayed) Labs Reviewed  CBC WITH DIFFERENTIAL/PLATELET - Abnormal; Notable for the following:       Result Value   RDW 16.3 (*)    All other components within normal limits  COMPREHENSIVE METABOLIC PANEL - Abnormal; Notable for the following:    AST 543 (*)    ALT 572 (*)    All other components within normal limits    EKG  EKG Interpretation None       Radiology Ct Abdomen Pelvis W Contrast  Result Date: 12/02/2016 CLINICAL DATA:   Diffuse abdominal pain. Frequent bowel movements today. EXAM: CT ABDOMEN AND PELVIS WITH CONTRAST TECHNIQUE: Multidetector CT imaging of the abdomen and pelvis was performed using the standard protocol following bolus administration of intravenous contrast. CONTRAST:  174m ISOVUE-300 IOPAMIDOL (ISOVUE-300) INJECTION 61% COMPARISON:  05/24/2016 FINDINGS: Lower chest: No acute abnormality. Hepatobiliary: No focal liver abnormality is seen. No gallstones, gallbladder wall thickening, or biliary dilatation. Pancreas: Unremarkable. No pancreatic ductal dilatation or surrounding inflammatory changes. Spleen: Normal  in size without focal abnormality. Adrenals/Urinary Tract: Adrenal glands are unremarkable. Kidneys are normal, without renal calculi, focal lesion, or hydronephrosis. Bladder is unremarkable. Stomach/Bowel: Stomach is within normal limits. Appendix appears normal. No evidence of bowel wall thickening, distention, or inflammatory changes. Vascular/Lymphatic: No significant vascular findings are present. No enlarged abdominal or pelvic lymph nodes. Reproductive: Unremarkable Other: No focal inflammation.  No ascites. Musculoskeletal: No significant skeletal lesion. IMPRESSION: No significant abnormality. Electronically Signed   By: Andreas Newport M.D.   On: 12/02/2016 22:33    Procedures Procedures (including critical care time)  Medications Ordered in ED Medications  sodium chloride 0.9 % bolus 1,000 mL (0 mLs Intravenous Stopped 12/02/16 2233)  HYDROmorphone (DILAUDID) injection 0.5 mg (0.5 mg Intravenous Given 12/02/16 2115)  ondansetron (ZOFRAN) injection 4 mg (4 mg Intravenous Given 12/02/16 2114)  iopamidol (ISOVUE-300) 61 % injection 100 mL (100 mLs Intravenous Contrast Given 12/02/16 2147)     Initial Impression / Assessment and Plan / ED Course  I have reviewed the triage vital signs and the nursing notes.  Pertinent labs & imaging results that were available during my care of the  patient were reviewed by me and considered in my medical decision making (see chart for details).     CT scan of the abdomen was unremarkable. Patient improved with IV fluids and pain medicine. Liver enzymes were elevated. The patient has a GI doctor and his mother states she will make sure he follows up with the GI doctor this week for his elevated liver studies  Final Clinical Impressions(s) / ED Diagnoses   Final diagnoses:  Left upper quadrant pain    New Prescriptions New Prescriptions   ONDANSETRON (ZOFRAN ODT) 4 MG DISINTEGRATING TABLET    56m ODT q4 hours prn nausea/vomit     ZMilton Ferguson MD 12/02/16 2312

## 2016-12-02 NOTE — Discharge Instructions (Signed)
Follow up with dr. [name] to recheck liver enzymes.  Drink plenty of fluids

## 2016-12-03 ENCOUNTER — Telehealth: Payer: Self-pay

## 2016-12-03 NOTE — Telephone Encounter (Signed)
Pt's mom, Sheran Lawless, called to say pt was seen at the ED yesterday and was told to follow up with Dr. [name] next week. I told her we do not have any appts ( no urgent appts either) with the extenders or Dr. [name] for next week. I told her I will inform Dr. [name] and let her advise!

## 2016-12-04 ENCOUNTER — Ambulatory Visit (INDEPENDENT_AMBULATORY_CARE_PROVIDER_SITE_OTHER): Payer: Medicaid Other | Admitting: Gastroenterology

## 2016-12-04 ENCOUNTER — Other Ambulatory Visit: Payer: Self-pay

## 2016-12-04 ENCOUNTER — Ambulatory Visit (HOSPITAL_COMMUNITY)
Admission: RE | Admit: 2016-12-04 | Discharge: 2016-12-04 | Disposition: A | Discharge status: Home / Self Care | Payer: Medicaid Other | Source: Ambulatory Visit | Attending: Gastroenterology | Admitting: Gastroenterology

## 2016-12-04 ENCOUNTER — Encounter: Payer: Self-pay | Admitting: Gastroenterology

## 2016-12-04 ENCOUNTER — Other Ambulatory Visit (HOSPITAL_COMMUNITY)
Admission: RE | Admit: 2016-12-04 | Discharge: 2016-12-04 | Disposition: A | Discharge status: Home / Self Care | Payer: Medicaid Other | Source: Ambulatory Visit | Attending: Gastroenterology | Admitting: Gastroenterology

## 2016-12-04 VITALS — BP 138/88 | HR 90 | Temp 99.3°F | Ht 66.0 in | Wt 233.0 lb

## 2016-12-04 DIAGNOSIS — R1011 Right upper quadrant pain: Secondary | ICD-10-CM | POA: Insufficient documentation

## 2016-12-04 DIAGNOSIS — K51919 Ulcerative colitis, unspecified with unspecified complications: Secondary | ICD-10-CM

## 2016-12-04 LAB — HEPATIC FUNCTION PANEL
ALT: 525 U/L — ABNORMAL HIGH (ref 17–63)
AST: 474 U/L — ABNORMAL HIGH (ref 15–41)
Albumin: 3.3 g/dL — ABNORMAL LOW (ref 3.5–5.0)
Alkaline Phosphatase: 102 U/L (ref 38–126)
Bilirubin, Direct: 0.1 mg/dL (ref 0.1–0.5)
Indirect Bilirubin: 0.2 mg/dL — ABNORMAL LOW (ref 0.3–0.9)
Total Bilirubin: 0.3 mg/dL (ref 0.3–1.2)
Total Protein: 7.1 g/dL (ref 6.5–8.1)

## 2016-12-04 LAB — LIPASE, BLOOD: Lipase: 27 U/L (ref 11–51)

## 2016-12-04 NOTE — Telephone Encounter (Signed)
REVIEWED-NO ADDITIONAL RECOMMENDATIONS. 

## 2016-12-04 NOTE — Telephone Encounter (Signed)
PT's mom called to see if we had cancellations and could get pt in today. He is having bad abdominal cramps and had about 10 loose stools yesterday, but not diarrhea. He has been scheduled with Roseanne Kaufman, NP at 10:00 Am today and told to be here at 9:45 am.

## 2016-12-04 NOTE — Patient Instructions (Signed)
I have ordered blood work and an ultrasound today. I am checking your liver and gallbladder closely.  We may need to do more blood work.   Stick with bland foods for now. We will be in touch as soon as the labs and ultrasound are complete!

## 2016-12-04 NOTE — Progress Notes (Signed)
LFTs improved but still elevated. US abdomen without gallstones or evidence of cholecystitis. Needs Hep C antibody, AMA, ANA, ASMA, INR,, ceruloplasmin, iron, ferritin, TIBC now.

## 2016-12-04 NOTE — Patient Instructions (Signed)
Submitted PA info for [place] abd limited RUQ. Case approved. Auth# [id number], 12/04/16-01/03/17.

## 2016-12-04 NOTE — Progress Notes (Addendum)
REVIEWED-NO ADDITIONAL RECOMMENDATIONS. PT MAY NEED A LIVER BIOPSIES IF SEROLOGIES ARE NEGATIVE.  Referring Provider: Raylene Everts, MD Primary Care Physician:  Raylene Everts, MD  Chief Complaint  Patient presents with  . Abdominal Pain    HPI:   Wayne Avila is a 26 y.o. male presenting today with a history of UC, on Remicade. History of CDI (positive PCR Aug 2017, OCt 2017, and May 23, 2016). Seen in ED several days ago with abdominal pain and diarrhea. Transaminases notably elevated acutely with AST 543, ALT 572. No leukocytosis or anemia. Renal function stable.   Went to ED with abdominal pain in right side. RUQ pain for about a week and worsening. Worse after eating something. Poor intake due to pain after eating. N/V noted at time of Ed presentation but resolved now. No alcohol. Tylenol 500 mg two yesterday. Hasn't taken any since. Prior to ED no tylenol. Vit D. 3 soft bowel movements today. No rectal bleeding. 10 soft stools yesterday.   Past Medical History:  Diagnosis Date  . ADHD (attention deficit hyperactivity disorder)   . Anxiety   . Asthma   . Bipolar 1 disorder ([place])   . Clostridium difficile colitis   . Depression   . GERD (gastroesophageal reflux disease)   . HOH (hard of hearing)   . Mental developmental delay   . Seizures ([place])     Past Surgical History:  Procedure Laterality Date  . ADENOIDECTOMY    . BIOPSY N/A 12/07/2012   Procedure: GASTRIC BIOPSIES;  Surgeon: [name], MD;  Location: [facility];  Service: Endoscopy;  Laterality: N/A;  . BIOPSY N/A 08/30/2013   Procedure: BIOPSY;  Surgeon: [name], MD;  Location: [facility];  Service: Endoscopy;  Laterality: N/A;  right colon,transverse colon, left colon, rectal biopsies  . BIOPSY  11/20/2015   Procedure: BIOPSY;  Surgeon: [name], MD;  Location: [facility];  Service: Endoscopy;;  ileal, right colon biopsy, left colon, rectum  . BIOPSY  04/18/2016   Procedure: BIOPSY;  Surgeon:  [name], MD;  Location: [facility];  Service: Endoscopy;;  left descending colon biopsies  . COLONOSCOPY  11/20/2015   Dr. [name]: Severe erythema, edema, ulcers from the anal verge to 20 cm above the anal verge without mucosal sparing, remaining colon and terminal ileum appeared normal. Biopsies from the rectum revealed fulminant active chronic colitis consistent with IBD. Pathology from terminal ileum revealed intramucosal lymphoid aggregates. Remaining colon random biopsies with inactive chronic colitis  . COLONOSCOPY WITH PROPOFOL N/A 08/30/2013   PTW:SFKCLE mucosa in the terminal ileum/COLITIS/ MILD PROCTITIS. 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  Marland Kitchen COLONOSCOPY WITH PROPOFOL N/A 11/20/2015   Dr. 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 ESOPHAGOGASTRODUODENOSCOPY  11/20/2015   Dr. [name]: Normal exam, stomach biopsied and duodenal biopsy.. Benign biopsies.  . ESOPHAGOGASTRODUODENOSCOPY (EGD) WITH PROPOFOL N/A 12/07/2012   SLF:The mucosa of the esophagus appeared normal Non-erosive gastritis (inflammation) was found in the gastric antrum; multiple biopsies The duodenal mucosa showed no abnormalities in the bulb and second portion of the duodenum  . ESOPHAGOGASTRODUODENOSCOPY (EGD) WITH PROPOFOL N/A 11/20/2015   Dr. [name]: normal with normal biopsies, negative H.pylori   . ESOPHAGOGASTRODUODENOSCOPY (EGD) WITH PROPOFOL N/A 04/18/2016   Procedure: ESOPHAGOGASTRODUODENOSCOPY (EGD) WITH PROPOFOL;  Surgeon: [name], MD;  Location: [facility];  Service:  Endoscopy;  Laterality: N/A;  . FLEXIBLE SIGMOIDOSCOPY N/A 04/18/2016   Procedure: FLEXIBLE SIGMOIDOSCOPY;  Surgeon: [name], MD;  Location: [facility];  Service: Endoscopy;  Laterality: N/A;  . TONSILLECTOMY      Current Outpatient Prescriptions  Medication Sig Dispense  Refill  . albuterol (PROVENTIL HFA;VENTOLIN HFA) 108 (90 Base) MCG/ACT inhaler Inhale 2 puffs into the lungs every 4 (four) hours as needed for wheezing or shortness of breath (and/or cough).    Marland Kitchen albuterol (PROVENTIL) (2.5 MG/3ML) 0.083% nebulizer solution Inhale 2.5 mg into the lungs every 6 (six) hours as needed for wheezing or shortness of breath.   11  . atomoxetine (STRATTERA) 100 MG capsule Take 100 mg by mouth daily.     . cholecalciferol (VITAMIN D) 1000 units tablet Take 1 tablet (1,000 Units total) by mouth daily. START AFTER VITAMIN D 50,000 U TABLETS ARE COMPLETE (Patient taking differently: Take 1,000 Units by mouth daily. ) 30 tablet 11  . cloNIDine (CATAPRES) 0.1 MG tablet   2  . divalproex (DEPAKOTE) 500 MG DR tablet Take 1 tablet (500 mg total) by mouth 2 (two) times daily. 60 tablet 2  . EPINEPHrine (EPIPEN 2-PAK) 0.3 mg/0.3 mL IJ SOAJ injection Inject 0.3 mg into the muscle once as needed (for severe allergic reaction).    Marland Kitchen loratadine (CLARITIN) 10 MG tablet Take 10 mg by mouth daily.    . montelukast (SINGULAIR) 10 MG tablet Take 1 tablet (10 mg total) by mouth at bedtime. 30 tablet 3  . ondansetron (ZOFRAN ODT) 4 MG disintegrating tablet 17m ODT q4 hours prn nausea/vomit 12 tablet 0  . ondansetron (ZOFRAN) 4 MG tablet Take 1 tablet (4 mg total) by mouth every 6 (six) hours as needed for nausea or vomiting. 12 tablet 0  . sertraline (ZOLOFT) 100 MG tablet Take 1 tablet (100 mg total) by mouth daily. 30 tablet 0  . traMADol (ULTRAM) 50 MG tablet Take 1 tablet (50 mg total) by mouth every 6 (six) hours as needed. 10 tablet 0  . traZODone (DESYREL) 50 MG tablet Take 1 tablet by mouth at bedtime.  0  . doxycycline (VIBRAMYCIN) 100 MG capsule Take 1 capsule (100 mg total) by mouth 2 (two) times daily. (Patient not taking: Reported on 12/04/2016) 28 capsule 0  . mometasone (NASONEX) 50 MCG/ACT nasal spray Place 2 sprays into the nose daily. (Patient not taking: Reported on 12/04/2016)  17 g 0  . pseudoephedrine (SUDAFED) 60 MG tablet Take 1 tablet (60 mg total) by mouth every 6 (six) hours as needed for congestion. (Patient not taking: Reported on 12/04/2016) 20 tablet 0   No current facility-administered medications for this visit.     Allergies as of 12/04/2016 - Review Complete 12/04/2016  Allergen Reaction Noted  . Amoxicillin-pot clavulanate Nausea And Vomiting and Other (See Comments) 02/28/2011  . Ibuprofen Other (See Comments) 07/03/2015  . Omeprazole Nausea And Vomiting 04/20/2013  . Pineapple Swelling and Other (See Comments) 04/14/2011  . Strawberry extract Swelling and Other (See Comments) 05/08/2012  . Tomato Rash 04/14/2011    Family History  Problem Relation Age of Onset  . Asthma Mother   . Ulcers Mother   . Bipolar disorder Mother   . ADD / ADHD Father   . Diabetes Maternal Grandmother   . Diabetes Maternal Grandfather   . Colon cancer Neg Hx   . Liver disease Neg Hx     Social History   Social History  . Marital status: Single  Spouse name: N/A  . Number of children: 0  . Years of education: 12th   Occupational History  .  Not Employed    not working   Social History Main Topics  . Smoking status: Former Smoker    Packs/day: 0.00    Years: 2.00    Quit date: 10/06/2012  . Smokeless tobacco: Never Used     Comment: Never smoked cigarettes  . Alcohol use No     Comment: denies usage  . Drug use: No  . Sexual activity: Yes   Other Topics Concern  . None   Social History Narrative   Patient lives at home with girlfriend and family.   Caffeine Use: occasionally    Review of Systems: As mentioned in HPI   Physical Exam: BP 138/88   Pulse 90   Temp 99.3 F (37.4 C) (Oral)   Ht 5' 6"  (1.676 m)   Wt 233 lb (105.7 kg)   BMI 37.61 kg/m  General:   Alert and oriented. No distress noted. Flat affect but cooperative. At baseline.  Head:  Normocephalic and atraumatic. Eyes:  Conjuctiva clear without scleral  icterus. Mouth:  Oral mucosa pink and moist. Good dentition. No lesions. Abdomen:  +BS, soft, TTP RUQ and non-distended. No rebound or guarding. No HSM or masses noted. Msk:  Symmetrical without gross deformities. Normal posture. Extremities:  Without edema. Neurologic:  Alert and  oriented x4;  grossly normal neurologically.  Psych:  Alert and cooperative. Normal mood and affect.

## 2016-12-04 NOTE — Progress Notes (Signed)
Pt's mom is aware of the results and plan. They are already in town and will have them done this afternoon.

## 2016-12-05 LAB — HEPATIC FUNCTION PANEL
ALT: 571 U/L — ABNORMAL HIGH (ref 9–46)
AST: 478 U/L — ABNORMAL HIGH (ref 10–40)
Albumin: 4 g/dL (ref 3.6–5.1)
Alkaline Phosphatase: 132 U/L — ABNORMAL HIGH (ref 40–115)
Bilirubin, Direct: 0.1 mg/dL (ref <=0.2)
Indirect Bilirubin: 0.2 mg/dL (ref 0.2–1.2)
Total Bilirubin: 0.3 mg/dL (ref 0.2–1.2)
Total Protein: 7.5 g/dL (ref 6.1–8.1)

## 2016-12-05 LAB — ANA: Anti Nuclear Antibody(ANA): NEGATIVE

## 2016-12-05 LAB — IRON AND TIBC
%SAT: 7 % — ABNORMAL LOW (ref 15–60)
Iron: 35 ug/dL — ABNORMAL LOW (ref 50–195)
TIBC: 528 ug/dL — ABNORMAL HIGH (ref 250–425)
UIBC: 493 ug/dL

## 2016-12-05 LAB — FERRITIN: Ferritin: 28 ng/mL (ref 20–345)

## 2016-12-05 LAB — PROTIME-INR
INR: 1.1
Prothrombin Time: 11.9 s — ABNORMAL HIGH (ref 9.0–11.5)

## 2016-12-05 LAB — MITOCHONDRIAL ANTIBODIES: Mitochondrial M2 Ab, IgG: <=20 Units (ref <=20.0)

## 2016-12-05 LAB — ANTI-SMOOTH MUSCLE ANTIBODY, IGG: Smooth Muscle Ab: 20 U — ABNORMAL HIGH (ref <20)

## 2016-12-05 LAB — HEPATITIS C ANTIBODY: HCV Ab: NEGATIVE

## 2016-12-05 LAB — LIPASE: Lipase: 43 U/L (ref 7–60)

## 2016-12-06 NOTE — Assessment & Plan Note (Signed)
26 year old male with history of UC that seems to be at baseline on Remicade, presenting with new onset RUQ pain for past week. No reports of OTC supplements, and he has not taken any tylenol prior to ED presentation. He does endorse only 2 tylenol (500 mg) yesterday and none since. No NSAIDs. Will proceed with [place] now, repeat LFTs. Assess for biliary etiology and if no obvious evidence of gallstones, cholecystitis, will need extensive serologies. Doubt related to Remicade.

## 2016-12-08 ENCOUNTER — Encounter (HOSPITAL_COMMUNITY)
Admission: RE | Admit: 2016-12-08 | Discharge: 2016-12-08 | Disposition: A | Discharge status: Home / Self Care | Payer: Medicaid Other | Source: Ambulatory Visit | Attending: Gastroenterology | Admitting: Gastroenterology

## 2016-12-08 DIAGNOSIS — K5190 Ulcerative colitis, unspecified, without complications: Secondary | ICD-10-CM | POA: Insufficient documentation

## 2016-12-08 LAB — CERULOPLASMIN: Ceruloplasmin: 29 mg/dL (ref 18–36)

## 2016-12-08 MED ORDER — SODIUM CHLORIDE 0.9 % IV SOLN
5.0000 mg/kg | Freq: Once | INTRAVENOUS | Status: AC
  Administered 2016-12-08: 500 mg via INTRAVENOUS
  Filled 2016-12-08: qty 50

## 2016-12-08 MED ORDER — SODIUM CHLORIDE 0.9 % IV SOLN
INTRAVENOUS | Status: DC
  Administered 2016-12-08: 250 mL via INTRAVENOUS

## 2016-12-08 NOTE — Progress Notes (Signed)
CC'D TO PCP °

## 2016-12-09 ENCOUNTER — Telehealth: Payer: Self-pay | Admitting: Gastroenterology

## 2016-12-09 NOTE — Telephone Encounter (Signed)
Pt's mother called to see if patient's lab results were available. Please call [phone]

## 2016-12-11 ENCOUNTER — Other Ambulatory Visit: Payer: Self-pay

## 2016-12-11 ENCOUNTER — Encounter (HOSPITAL_COMMUNITY): Payer: Self-pay | Admitting: Emergency Medicine

## 2016-12-11 DIAGNOSIS — Z87891 Personal history of nicotine dependence: Secondary | ICD-10-CM | POA: Insufficient documentation

## 2016-12-11 DIAGNOSIS — J45909 Unspecified asthma, uncomplicated: Secondary | ICD-10-CM | POA: Insufficient documentation

## 2016-12-11 DIAGNOSIS — Z880 Allergy status to penicillin: Secondary | ICD-10-CM | POA: Insufficient documentation

## 2016-12-11 DIAGNOSIS — R1011 Right upper quadrant pain: Secondary | ICD-10-CM | POA: Diagnosis not present

## 2016-12-11 DIAGNOSIS — Z79899 Other long term (current) drug therapy: Secondary | ICD-10-CM | POA: Insufficient documentation

## 2016-12-11 NOTE — Progress Notes (Signed)
PT's mom is aware and OK to schedule a liver biopsy.

## 2016-12-11 NOTE — Progress Notes (Signed)
LMOM to call.

## 2016-12-11 NOTE — Telephone Encounter (Signed)
Noted  

## 2016-12-11 NOTE — ED Triage Notes (Signed)
ruq pain, n/v x 3 weeks.  Hx of liver problem, in process of having liver biopsy scheduled

## 2016-12-11 NOTE — Telephone Encounter (Signed)
Please see result note 

## 2016-12-11 NOTE — Telephone Encounter (Signed)
Forwarding to Roseanne Kaufman, NP for results.

## 2016-12-11 NOTE — Progress Notes (Signed)
Ceruloplasmin normal. ASMA right at 20, which is positive but non-specific and very weakly positive. ANA and AMA negative. Needs liver biopsy to further sort out as this is not straight-forward.

## 2016-12-12 ENCOUNTER — Emergency Department (HOSPITAL_COMMUNITY): Payer: Medicaid Other

## 2016-12-12 ENCOUNTER — Emergency Department (HOSPITAL_COMMUNITY)
Admission: EM | Admit: 2016-12-12 | Discharge: 2016-12-12 | Disposition: A | Discharge status: Home / Self Care | Payer: Medicaid Other | Attending: Emergency Medicine | Admitting: Emergency Medicine

## 2016-12-12 ENCOUNTER — Other Ambulatory Visit: Payer: Self-pay

## 2016-12-12 DIAGNOSIS — R1011 Right upper quadrant pain: Secondary | ICD-10-CM

## 2016-12-12 DIAGNOSIS — R945 Abnormal results of liver function studies: Principal | ICD-10-CM

## 2016-12-12 DIAGNOSIS — R110 Nausea: Secondary | ICD-10-CM

## 2016-12-12 DIAGNOSIS — R7989 Other specified abnormal findings of blood chemistry: Secondary | ICD-10-CM

## 2016-12-12 LAB — CBC WITH DIFFERENTIAL/PLATELET
Basophils Absolute: 0 10*3/uL (ref 0.0–0.1)
Basophils Relative: 0 %
Eosinophils Absolute: 0 10*3/uL (ref 0.0–0.7)
Eosinophils Relative: 0 %
HCT: 40.6 % (ref 39.0–52.0)
Hemoglobin: 13.4 g/dL (ref 13.0–17.0)
Lymphocytes Relative: 26 %
Lymphs Abs: 2.2 10*3/uL (ref 0.7–4.0)
MCH: 26.7 pg (ref 26.0–34.0)
MCHC: 33 g/dL (ref 30.0–36.0)
MCV: 80.9 fL (ref 78.0–100.0)
Monocytes Absolute: 0.6 10*3/uL (ref 0.1–1.0)
Monocytes Relative: 7 %
Neutro Abs: 5.9 10*3/uL (ref 1.7–7.7)
Neutrophils Relative %: 67 %
Platelets: 227 10*3/uL (ref 150–400)
RBC: 5.02 MIL/uL (ref 4.22–5.81)
RDW: 17.1 % — ABNORMAL HIGH (ref 11.5–15.5)
WBC: 8.7 10*3/uL (ref 4.0–10.5)

## 2016-12-12 LAB — URINALYSIS, ROUTINE W REFLEX MICROSCOPIC
Bilirubin Urine: NEGATIVE
Glucose, UA: NEGATIVE mg/dL
Hgb urine dipstick: NEGATIVE
Ketones, ur: NEGATIVE mg/dL
Leukocytes, UA: NEGATIVE
Nitrite: NEGATIVE
Protein, ur: NEGATIVE mg/dL
Specific Gravity, Urine: 1.027 (ref 1.005–1.030)
pH: 5 (ref 5.0–8.0)

## 2016-12-12 LAB — COMPREHENSIVE METABOLIC PANEL
ALT: 471 U/L — ABNORMAL HIGH (ref 17–63)
AST: 464 U/L — ABNORMAL HIGH (ref 15–41)
Albumin: 3.6 g/dL (ref 3.5–5.0)
Alkaline Phosphatase: 111 U/L (ref 38–126)
Anion gap: 8 (ref 5–15)
BUN: 7 mg/dL (ref 6–20)
CO2: 26 mmol/L (ref 22–32)
Calcium: 9.5 mg/dL (ref 8.9–10.3)
Chloride: 106 mmol/L (ref 101–111)
Creatinine, Ser: 0.88 mg/dL (ref 0.61–1.24)
GFR calc Af Amer: >60 mL/min (ref >60)
GFR calc non Af Amer: >60 mL/min (ref >60)
Glucose, Bld: 99 mg/dL (ref 65–99)
Potassium: 4.1 mmol/L (ref 3.5–5.1)
Sodium: 140 mmol/L (ref 135–145)
Total Bilirubin: 0.9 mg/dL (ref 0.3–1.2)
Total Protein: 7.6 g/dL (ref 6.5–8.1)

## 2016-12-12 LAB — LIPASE, BLOOD: Lipase: 27 U/L (ref 11–51)

## 2016-12-12 LAB — ACETAMINOPHEN LEVEL: Acetaminophen (Tylenol), Serum: <10 ug/mL — ABNORMAL LOW (ref 10–30)

## 2016-12-12 MED ORDER — IOPAMIDOL (ISOVUE-300) INJECTION 61%
100.0000 mL | Freq: Once | INTRAVENOUS | Status: AC | PRN
Start: 2016-12-12 — End: 2016-12-12
  Administered 2016-12-12: 100 mL via INTRAVENOUS

## 2016-12-12 MED ORDER — IOPAMIDOL (ISOVUE-370) INJECTION 76%: 100.0000 mL | Freq: Once | INTRAVENOUS | Status: DC | PRN

## 2016-12-12 MED ORDER — MORPHINE SULFATE (PF) 4 MG/ML IV SOLN
4.0000 mg | Freq: Once | INTRAVENOUS | Status: AC
  Administered 2016-12-12: 4 mg via INTRAVENOUS
  Filled 2016-12-12: qty 1

## 2016-12-12 MED ORDER — ONDANSETRON HCL 4 MG/2ML IJ SOLN
4.0000 mg | Freq: Once | INTRAMUSCULAR | Status: AC
  Administered 2016-12-12: 4 mg via INTRAVENOUS
  Filled 2016-12-12: qty 2

## 2016-12-12 NOTE — Discharge Instructions (Signed)
Your liver function is still elevated but improving. Follow up with the GI doctor for a HIDA scan and liver biopsy as scheduled. Try to avoid tylenol as it can be rough on the liver. Return to the ED if you develop new or worsening symptoms.

## 2016-12-12 NOTE — Progress Notes (Signed)
LMOM to call.

## 2016-12-12 NOTE — Progress Notes (Signed)
If patient is still having significant RUQ pain and nausea after eating, would recommend HIDA scan as well with ensure.

## 2016-12-12 NOTE — ED Provider Notes (Signed)
[facility] DEPT Provider Note   CSN: [id number] Arrival date & time: 12/11/16  2306     History   Chief Complaint Chief Complaint  Patient presents with  . Abdominal Pain    HPI Wayne Avila is a 26 y.o. male.  Patient with history of ulcerative colitis on Remicade presenting with recurrent right upper quadrant abdominal pain with nausea and vomiting. States this pain has been persistent for the past several weeks and not improving with Tylenol. He was told by his gastroenterologist that he needed to have a liver biopsy which is being scheduled. He's had a CT scan and ultrasound both of which have been reassuring. Denies any diarrhea or change in his bowel habits. Had one episode of vomiting today. Has had poor by mouth intake throughout the day. Denies any fever. Denies any pain with urination or blood in the urine. Denies any chest pain or shortness of breath. Denies taking anything for pain other than Tylenol.   The history is provided by the patient and a parent.  Abdominal Pain   Associated symptoms include nausea and vomiting. Pertinent negatives include diarrhea, constipation, dysuria, headaches, arthralgias and myalgias.    Past Medical History:  Diagnosis Date  . ADHD (attention deficit hyperactivity disorder)   . Anxiety   . Asthma   . Bipolar 1 disorder ([place])   . Clostridium difficile colitis   . Depression   . GERD (gastroesophageal reflux disease)   . HOH (hard of hearing)   . Mental developmental delay   . Seizures [facility])     Patient Active Problem List   Diagnosis Date Noted  . Clostridium difficile colitis 05/24/2016  . Acute diarrhea 05/22/2016  . Abnormal thyroid function test 03/24/2016  . Acute blood loss anemia 03/23/2016  . C. difficile colitis 03/22/2016  . Hypovolemia 03/22/2016  . Bipolar I disorder ([place]) 03/22/2016  . Ulcerative colitis ([place]) 12/29/2015  . CAP (community acquired pneumonia)   . Aspiration pneumonitis ([place]) 11/21/2015  .  Bipolar I disorder, most recent episode depressed ([place])   . Bipolar 1 disorder, manic, moderate ([place]) 11/09/2014  . Suicidal ideation   . Major depressive disorder, recurrent episode, moderate ([place])   . Oral thrush 02/23/2014  . RUQ pain 08/09/2013  . Mental developmental delay 11/15/2012    Past Surgical History:  Procedure Laterality Date  . ADENOIDECTOMY    . BIOPSY N/A 12/07/2012   Procedure: GASTRIC BIOPSIES;  Surgeon: [name], MD;  Location: [facility] ORS;  Service: Endoscopy;  Laterality: N/A;  . BIOPSY N/A 08/30/2013   Procedure: BIOPSY;  Surgeon: [name], MD;  Location: [facility];  Service: Endoscopy;  Laterality: N/A;  right colon,transverse colon, left colon, rectal biopsies  . BIOPSY  11/20/2015   Procedure: BIOPSY;  Surgeon: [name], MD;  Location: [facility];  Service: Endoscopy;;  ileal, right colon biopsy, left colon, rectum  . BIOPSY  04/18/2016   Procedure: BIOPSY;  Surgeon: [name], MD;  Location: [facility];  Service: Endoscopy;;  left descending colon biopsies  . COLONOSCOPY  11/20/2015   Dr. [name]: Severe erythema, edema, ulcers from the anal verge to 20 cm above the anal verge without mucosal sparing, remaining colon and terminal ileum appeared normal. Biopsies from the rectum revealed fulminant active chronic colitis consistent with IBD. Pathology from terminal ileum revealed intramucosal lymphoid aggregates. Remaining colon random biopsies with inactive chronic colitis  . COLONOSCOPY WITH PROPOFOL N/A 08/30/2013   [facility]:XNATFT mucosa in the  terminal ileum/COLITIS/ MILD PROCTITIS. 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  Marland Kitchen COLONOSCOPY WITH PROPOFOL N/A 11/20/2015   Dr. 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 ESOPHAGOGASTRODUODENOSCOPY  11/20/2015   Dr. [name]: Normal exam, stomach biopsied and  duodenal biopsy.. Benign biopsies.  . ESOPHAGOGASTRODUODENOSCOPY (EGD) WITH PROPOFOL N/A 12/07/2012   [facility]:The mucosa of the esophagus appeared normal Non-erosive gastritis (inflammation) was found in the gastric antrum; multiple biopsies The duodenal mucosa showed no abnormalities in the bulb and second portion of the duodenum  . ESOPHAGOGASTRODUODENOSCOPY (EGD) WITH PROPOFOL N/A 11/20/2015   Dr. [name]: normal with normal biopsies, negative H.pylori   . ESOPHAGOGASTRODUODENOSCOPY (EGD) WITH PROPOFOL N/A 04/18/2016   Procedure: ESOPHAGOGASTRODUODENOSCOPY (EGD) WITH PROPOFOL;  Surgeon: [name], MD;  [facility];  Service: Endoscopy;  Laterality: N/A;  . FLEXIBLE SIGMOIDOSCOPY N/A 04/18/2016   Procedure: FLEXIBLE SIGMOIDOSCOPY;  Surgeon: [name], MD;  Location: [facility];  Service: Endoscopy;  Laterality: N/A;  . TONSILLECTOMY         Home Medications    Prior to Admission medications   Medication Sig Start Date End Date Taking? Authorizing Provider  albuterol (PROVENTIL HFA;VENTOLIN HFA) 108 (90 Base) MCG/ACT inhaler Inhale 2 puffs into the lungs every 4 (four) hours as needed for wheezing or shortness of breath (and/or cough).    [provider]  albuterol (PROVENTIL) (2.5 MG/3ML) 0.083% nebulizer solution Inhale 2.5 mg into the lungs every 6 (six) hours as needed for wheezing or shortness of breath.     [provider]  atomoxetine (STRATTERA) 100 MG capsule Take 100 mg by mouth daily.     [provider]  cholecalciferol (VITAMIN D) 1000 units tablet Take 1 tablet (1,000 Units total) by mouth daily. START AFTER VITAMIN D 50,000 U TABLETS ARE COMPLETE Patient taking differently: Take 1,000 Units by mouth daily.  04/08/16   [name], MD  cloNIDine (CATAPRES) 0.1 MG tablet  11/06/16   [provider]  divalproex (DEPAKOTE) 500 MG DR tablet Take 1 tablet (500 mg total) by mouth 2 (two) times daily. 07/03/15   [name], [name], MD  EPINEPHrine (EPIPEN 2-PAK) 0.3 mg/0.3 mL IJ SOAJ injection Inject 0.3 mg into the muscle once as needed (for severe allergic reaction).    [provider]  loratadine (CLARITIN) 10 MG tablet Take 10 mg by mouth daily.    [provider]  montelukast (SINGULAIR) 10 MG tablet Take 1 tablet (10 mg total) by mouth at bedtime. 11/13/14   [name], NP  ondansetron (ZOFRAN ODT) 4 MG disintegrating tablet 58m ODT q4 hours prn nausea/vomit 12/02/16   ZMilton Ferguson MD  ondansetron (ZOFRAN) 4 MG tablet Take 1 tablet (4 mg total) by mouth every 6 (six) hours as needed for nausea or vomiting. 07/22/16   LForde Dandy MD  sertraline (ZOLOFT) 100 MG tablet Take 1 tablet (100 mg total) by mouth daily. 11/13/14   DNiel Hummer NP  traMADol (ULTRAM) 50 MG tablet Take 1 tablet (50 mg total) by mouth every 6 (six) hours as needed. 68/9/21  GDelora Fuel MD  traZODone (DESYREL) 50 MG tablet Take 1 tablet by mouth at bedtime. 09/10/16   [provider]    Family History Family History  Problem Relation Age of Onset  . Asthma Mother   . Ulcers Mother   .  Bipolar disorder Mother   . ADD / ADHD Father   . Diabetes Maternal Grandmother   . Diabetes Maternal Grandfather   . Colon cancer Neg Hx   . Liver disease Neg Hx     Social History Social History  Substance Use Topics  . Smoking status: Former Smoker    Packs/day: 0.00    Years: 2.00    Quit date: 10/06/2012  . Smokeless tobacco: Never Used     Comment: Never smoked cigarettes  . Alcohol use No     Comment: denies usage     Allergies   Amoxicillin-pot clavulanate; Ibuprofen; Omeprazole; Pineapple; Strawberry extract; and Tomato   Review of Systems Review of Systems  Constitutional: Positive for appetite change.  HENT: Negative for congestion.   Respiratory: Negative for cough, chest tightness and shortness of breath.   Gastrointestinal: Positive for abdominal pain, nausea and vomiting. Negative for  constipation and diarrhea.  Genitourinary: Negative for dysuria and testicular pain.  Musculoskeletal: Negative for arthralgias, back pain and myalgias.  Skin: Negative for rash.  Neurological: Negative for dizziness, weakness, light-headedness and headaches.   all other systems are negative except as noted in the HPI and PMH.     Physical Exam Updated Vital Signs BP 119/75 (BP Location: Right Arm)   Pulse 95   Temp 98.5 F (36.9 C) (Oral)   Resp 18   Ht 5' 6"  (1.676 m)   Wt 104.8 kg (231 lb)   SpO2 96%   BMI 37.28 kg/m   Physical Exam  Constitutional: He is oriented to person, place, and time. He appears well-developed and well-nourished. No distress.  HENT:  Head: Normocephalic and atraumatic.  Mouth/Throat: Oropharynx is clear and moist. No oropharyngeal exudate.  Eyes: Conjunctivae and EOM are normal. Pupils are equal, round, and reactive to light.  Neck: Normal range of motion. Neck supple.  No meningismus.  Cardiovascular: Normal rate, regular rhythm, normal heart sounds and intact distal pulses.   No murmur heard. Pulmonary/Chest: Effort normal and breath sounds normal. No respiratory distress.  Abdominal: Soft. There is tenderness. There is no rebound and no guarding.  TTP RUQ and RLQ with voluntary guarding  Musculoskeletal: Normal range of motion. He exhibits no edema or tenderness.  Neurological: He is alert and oriented to person, place, and time. No cranial nerve deficit. He exhibits normal muscle tone. Coordination normal.   5/5 strength throughout. CN 2-12 intact.Equal grip strength.   Skin: Skin is warm.  Psychiatric: He has a normal mood and affect. His behavior is normal.  Nursing note and vitals reviewed.    ED Treatments / Results  Labs (all labs ordered are listed, but only abnormal results are displayed) Labs Reviewed  CBC WITH DIFFERENTIAL/PLATELET - Abnormal; Notable for the following:       Result Value   RDW 17.1 (*)    All other components  within normal limits  COMPREHENSIVE METABOLIC PANEL - Abnormal; Notable for the following:    AST 464 (*)    ALT 471 (*)    All other components within normal limits  URINALYSIS, ROUTINE W REFLEX MICROSCOPIC - Abnormal; Notable for the following:    Color, Urine AMBER (*)    All other components within normal limits  ACETAMINOPHEN LEVEL - Abnormal; Notable for the following:    Acetaminophen (Tylenol), Serum <10 (*)    All other components within normal limits  LIPASE, BLOOD    EKG  EKG Interpretation None       Radiology  Ct Abdomen Pelvis W Contrast  Result Date: 12/12/2016 CLINICAL DATA:  Subacute onset of nausea, vomiting and right upper quadrant abdominal pain. Initial encounter. EXAM: CT ABDOMEN AND PELVIS WITH CONTRAST TECHNIQUE: Multidetector CT imaging of the abdomen and pelvis was performed using the standard protocol following bolus administration of intravenous contrast. CONTRAST:  155m ISOVUE-300 IOPAMIDOL (ISOVUE-300) INJECTION 61% COMPARISON:  CT of the abdomen and pelvis performed 12/02/2016, and right upper quadrant ultrasound performed 12/04/2016 FINDINGS: Lower chest: The visualized lung bases are grossly clear. The visualized portions of the mediastinum are unremarkable. Hepatobiliary: The liver is unremarkable in appearance. The gallbladder is unremarkable in appearance. The common bile duct remains normal in caliber. Pancreas: The pancreas is within normal limits. Spleen: The spleen is unremarkable in appearance. Adrenals/Urinary Tract: The adrenal glands are unremarkable in appearance. The kidneys are within normal limits. There is no evidence of hydronephrosis. No renal or ureteral stones are identified. No perinephric stranding is seen. Stomach/Bowel: The stomach is unremarkable in appearance. The small bowel is within normal limits. The appendix is normal in caliber, without evidence of appendicitis. The colon is unremarkable in appearance. Vascular/Lymphatic: The  abdominal aorta is unremarkable in appearance. The inferior vena cava is grossly unremarkable. No retroperitoneal lymphadenopathy is seen. No pelvic sidewall lymphadenopathy is identified. Reproductive: The bladder is decompressed and not well characterized. The prostate remains normal in size. Other: No additional soft tissue abnormalities are seen. Musculoskeletal: No acute osseous abnormalities are identified. The visualized musculature is unremarkable in appearance. IMPRESSION: Unremarkable contrast-enhanced CT of the abdomen and pelvis. Electronically Signed   By: JGarald BaldingM.D.   On: 12/12/2016 03:27    Procedures Procedures (including critical care time)  Medications Ordered in ED Medications  ondansetron (ZOFRAN) injection 4 mg (4 mg Intravenous Given 12/12/16 0216)  morphine 4 MG/ML injection 4 mg (4 mg Intravenous Given 12/12/16 0216)     Initial Impression / Assessment and Plan / ED Course  I have reviewed the triage vital signs and the nursing notes.  Pertinent labs & imaging results that were available during my care of the patient were reviewed by me and considered in my medical decision making (see chart for details).    Acute on chronic right-sided abdominal pain involving both right lower and upper quadrants. Afebrile.  Patient had CT scan on June 26 that showed no acute pathology. Also had ultrasound on June 28.  LFTs remain elevated but mildly improved. No acute findings on imaging. No vomiting in the ED. APAP negative.  Patient to follow-up with his gastroenterologist for biopsy as scheduled. May also need HIDA scan. Instructed to minimize Tylenol use. He is tolerating by mouth. Abdomen is soft without peritoneal signs. Return precautions discussed. Final Clinical Impressions(s) / ED Diagnoses   Final diagnoses:  Right upper quadrant abdominal pain    New Prescriptions New Prescriptions   No medications on file     REzequiel Essex MD 12/12/16 [phone]

## 2016-12-12 NOTE — ED Notes (Signed)
Pt was given sprite to drink.

## 2016-12-12 NOTE — Progress Notes (Signed)
Pt's mom called back and said he is still having the RUQ pain and nausea and would like to schedule the HIDA scan also.

## 2016-12-13 ENCOUNTER — Encounter (HOSPITAL_COMMUNITY): Payer: Self-pay | Admitting: *Deleted

## 2016-12-13 ENCOUNTER — Emergency Department (HOSPITAL_COMMUNITY)
Admission: EM | Admit: 2016-12-13 | Discharge: 2016-12-13 | Disposition: A | Discharge status: Home / Self Care | Payer: Medicaid Other | Attending: Emergency Medicine | Admitting: Emergency Medicine

## 2016-12-13 DIAGNOSIS — K5190 Ulcerative colitis, unspecified, without complications: Secondary | ICD-10-CM | POA: Insufficient documentation

## 2016-12-13 DIAGNOSIS — R740 Nonspecific elevation of levels of transaminase and lactic acid dehydrogenase [LDH]: Secondary | ICD-10-CM | POA: Diagnosis not present

## 2016-12-13 DIAGNOSIS — R1011 Right upper quadrant pain: Secondary | ICD-10-CM | POA: Diagnosis not present

## 2016-12-13 DIAGNOSIS — Z87891 Personal history of nicotine dependence: Secondary | ICD-10-CM | POA: Insufficient documentation

## 2016-12-13 DIAGNOSIS — Z79899 Other long term (current) drug therapy: Secondary | ICD-10-CM | POA: Insufficient documentation

## 2016-12-13 DIAGNOSIS — J45909 Unspecified asthma, uncomplicated: Secondary | ICD-10-CM | POA: Insufficient documentation

## 2016-12-13 DIAGNOSIS — R7401 Elevation of levels of liver transaminase levels: Secondary | ICD-10-CM

## 2016-12-13 DIAGNOSIS — F79 Unspecified intellectual disabilities: Secondary | ICD-10-CM | POA: Diagnosis not present

## 2016-12-13 DIAGNOSIS — R110 Nausea: Secondary | ICD-10-CM

## 2016-12-13 LAB — URINALYSIS, ROUTINE W REFLEX MICROSCOPIC
Bilirubin Urine: NEGATIVE
Glucose, UA: NEGATIVE mg/dL
Hgb urine dipstick: NEGATIVE
Ketones, ur: NEGATIVE mg/dL
Leukocytes, UA: NEGATIVE
Nitrite: NEGATIVE
Protein, ur: NEGATIVE mg/dL
Specific Gravity, Urine: 1.019 (ref 1.005–1.030)
pH: 6 (ref 5.0–8.0)

## 2016-12-13 LAB — COMPREHENSIVE METABOLIC PANEL
ALT: 496 U/L — ABNORMAL HIGH (ref 17–63)
AST: 511 U/L — ABNORMAL HIGH (ref 15–41)
Albumin: 3.6 g/dL (ref 3.5–5.0)
Alkaline Phosphatase: 107 U/L (ref 38–126)
Anion gap: 7 (ref 5–15)
BUN: 8 mg/dL (ref 6–20)
CO2: 27 mmol/L (ref 22–32)
Calcium: 9.3 mg/dL (ref 8.9–10.3)
Chloride: 106 mmol/L (ref 101–111)
Creatinine, Ser: 0.88 mg/dL (ref 0.61–1.24)
GFR calc Af Amer: >60 mL/min (ref >60)
GFR calc non Af Amer: >60 mL/min (ref >60)
Glucose, Bld: 88 mg/dL (ref 65–99)
Potassium: 4.7 mmol/L (ref 3.5–5.1)
Sodium: 140 mmol/L (ref 135–145)
Total Bilirubin: 1.1 mg/dL (ref 0.3–1.2)
Total Protein: 7.4 g/dL (ref 6.5–8.1)

## 2016-12-13 LAB — LIPASE, BLOOD: Lipase: 27 U/L (ref 11–51)

## 2016-12-13 LAB — ETHANOL: Alcohol, Ethyl (B): <5 mg/dL (ref <5)

## 2016-12-13 MED ORDER — SODIUM CHLORIDE 0.9 % IV BOLUS (SEPSIS)
1000.0000 mL | Freq: Once | INTRAVENOUS | Status: AC
  Administered 2016-12-13: 1000 mL via INTRAVENOUS

## 2016-12-13 MED ORDER — ONDANSETRON 4 MG PO TBDP: 4.0000 mg | ORAL_TABLET | Freq: Three times a day (TID) | ORAL | 0 refills | Status: DC | PRN

## 2016-12-13 MED ORDER — ONDANSETRON HCL 4 MG/2ML IJ SOLN
4.0000 mg | Freq: Once | INTRAMUSCULAR | Status: AC
  Administered 2016-12-13: 4 mg via INTRAVENOUS
  Filled 2016-12-13: qty 2

## 2016-12-13 NOTE — ED Triage Notes (Signed)
Pt comes in with abdominal pain and vomiting for 3 days.

## 2016-12-13 NOTE — ED Provider Notes (Signed)
[facility] DEPT Provider Note   CSN: [id number] Arrival date & time: 12/13/16  1619     History   Chief Complaint Chief Complaint  Patient presents with  . Abdominal Pain    HPI Wayne Avila is a 26 y.o. male.  HPI  The patient is a 26 year old male, he has a known history of ulcerative colitis, he is on Remicade, he has recently been diagnosed with some type of transaminitis and referred to gastroenterology. He has had an ultrasound and a CT scan, on June 28 for the ultrasound and June 26 for the CT scan, neither of which have shown any abnormal findings. Other than some increased liver echotexture.  He has had transaminitis with LFTs in the 500 range. He has been followed up by gastroenterology and they have scheduled a hepatobiliary scan on July 11 and a biopsy on July 13. The patient states that he has had ongoing nausea and vomiting with right upper quadrant pain for 3 weeks, nothing seems to be changing, he has the same symptoms that he has had constantly. He was in the ER 1-1/2 days ago at which time he received some symptomatic therapy. His labs were grossly unchanged at that time. The mother reports that he has not had very much by mouth, he continues to have normal bowel movements but when he vomits he does have flecks of blood in his vomit. Denies any urinary symptoms.  Past Medical History:  Diagnosis Date  . ADHD (attention deficit hyperactivity disorder)   . Anxiety   . Asthma   . Bipolar 1 disorder ([place])   . Clostridium difficile colitis   . Depression   . GERD (gastroesophageal reflux disease)   . HOH (hard of hearing)   . Mental developmental delay   . Seizures [facility])     Patient Active Problem List   Diagnosis Date Noted  . Clostridium difficile colitis 05/24/2016  . Acute diarrhea 05/22/2016  . Abnormal thyroid function test 03/24/2016  . Acute blood loss anemia 03/23/2016  . C. difficile colitis 03/22/2016  . Hypovolemia 03/22/2016  . Bipolar I  disorder ([place]) 03/22/2016  . Ulcerative colitis ([place]) 12/29/2015  . CAP (community acquired pneumonia)   . Aspiration pneumonitis ([place]) 11/21/2015  . Bipolar I disorder, most recent episode depressed ([place])   . Bipolar 1 disorder, manic, moderate ([place]) 11/09/2014  . Suicidal ideation   . Major depressive disorder, recurrent episode, moderate ([place])   . Oral thrush 02/23/2014  . RUQ pain 08/09/2013  . Mental developmental delay 11/15/2012    Past Surgical History:  Procedure Laterality Date  . ADENOIDECTOMY    . BIOPSY N/A 12/07/2012   Procedure: GASTRIC BIOPSIES;  Surgeon: [name], MD;  Location: [facility] ORS;  Service: Endoscopy;  Laterality: N/A;  . BIOPSY N/A 08/30/2013   Procedure: BIOPSY;  Surgeon: [name], MD;  Location: [facility] ORS;  Service: Endoscopy;  Laterality: N/A;  right colon,transverse colon, left colon, rectal biopsies  . BIOPSY  11/20/2015   Procedure: BIOPSY;  Surgeon: [name], MD;  Location: [facility];  Service: Endoscopy;;  ileal, right colon biopsy, left colon, rectum  . BIOPSY  04/18/2016   Procedure: BIOPSY;  Surgeon: [name], MD;  Location: [facility];  Service: Endoscopy;;  left descending colon biopsies  . COLONOSCOPY  11/20/2015   Dr. [name]: Severe erythema, edema, ulcers from the anal verge to 20 cm above the anal verge without mucosal sparing, remaining colon and terminal ileum  appeared normal. Biopsies from the rectum revealed fulminant active chronic colitis consistent with IBD. Pathology from terminal ileum revealed intramucosal lymphoid aggregates. Remaining colon random biopsies with inactive chronic colitis  . COLONOSCOPY WITH PROPOFOL N/A 08/30/2013   KJZ:PHXTAV mucosa in the terminal ileum/COLITIS/ MILD PROCTITIS. 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  Marland Kitchen COLONOSCOPY WITH PROPOFOL N/A 11/20/2015   Dr. [name]: chronic  inactive pancolitis and active severe ulcerative proctitis   . ESOPHAGOGASTRODUODENOSCOPY  11/20/2015   Dr. [name]: Normal exam, stomach biopsied and duodenal biopsy.. Benign biopsies.  . ESOPHAGOGASTRODUODENOSCOPY (EGD) WITH PROPOFOL N/A 12/07/2012   SLF:The mucosa of the esophagus appeared normal Non-erosive gastritis (inflammation) was found in the gastric antrum; multiple biopsies The duodenal mucosa showed no abnormalities in the bulb and second portion of the duodenum  . ESOPHAGOGASTRODUODENOSCOPY (EGD) WITH PROPOFOL N/A 11/20/2015   Dr. [name]: normal with normal biopsies, negative H.pylori   . ESOPHAGOGASTRODUODENOSCOPY (EGD) WITH PROPOFOL N/A 04/18/2016   Procedure: ESOPHAGOGASTRODUODENOSCOPY (EGD) WITH PROPOFOL;  Surgeon: [name], MD;  Location[facility];  Service: Endoscopy;  Laterality: N/A;  . FLEXIBLE SIGMOIDOSCOPY N/A 04/18/2016   Procedure: FLEXIBLE SIGMOIDOSCOPY;  Surgeon: [name], MD;  Location: [facility];  Service: Endoscopy;  Laterality: N/A;  . TONSILLECTOMY         Home Medications    Prior to Admission medications   Medication Sig Start Date End Date Taking? Authorizing Provider  albuterol (PROVENTIL HFA;VENTOLIN HFA) 108 (90 Base) MCG/ACT inhaler Inhale 2 puffs into the lungs every 4 (four) hours as needed for wheezing or shortness of breath (and/or cough).   Yes [provider]  albuterol (PROVENTIL) (2.5 MG/3ML) 0.083% nebulizer solution Inhale 2.5 mg into the lungs every 6 (six) hours as needed for wheezing or shortness of breath.    Yes [provider]  atomoxetine (STRATTERA) 100 MG capsule Take 100 mg by mouth daily.    Yes [provider]  cholecalciferol (VITAMIN D) 1000 units tablet Take 1 tablet (1,000 Units total) by mouth daily. START AFTER VITAMIN D 50,000 U TABLETS ARE COMPLETE Patient taking differently: Take 1,000 Units by mouth daily.  04/08/16  Yes Fields, [name], MD  cloNIDine (CATAPRES) 0.1 MG tablet  Take 0.1-0.2 mg by mouth at bedtime.  11/06/16  Yes [provider]  divalproex (DEPAKOTE) 500 MG DR tablet Take 1 tablet (500 mg total) by mouth 2 (two) times daily. 07/03/15  Yes [name], [name], MD  EPINEPHrine (EPIPEN 2-PAK) 0.3 mg/0.3 mL IJ SOAJ injection Inject 0.3 mg into the muscle once as needed (for severe allergic reaction).   Yes [provider]  loratadine (CLARITIN) 10 MG tablet Take 10 mg by mouth daily.   Yes [provider]  montelukast (SINGULAIR) 10 MG tablet Take 1 tablet (10 mg total) by mouth at bedtime. 11/13/14  Yes [name], NP  sertraline (ZOLOFT) 100 MG tablet Take 1 tablet (100 mg total) by mouth daily. 11/13/14  Yes [name], NP  traZODone (DESYREL) 100 MG tablet Take 100 mg by mouth at bedtime.  09/10/16  Yes [provider]  ondansetron (ZOFRAN ODT) 4 MG disintegrating tablet Take 1 tablet (4 mg total) by mouth every 8 (eight) hours as needed for nausea. 12/13/16   [name], MD    Family History Family History  Problem Relation Age of Onset  . Asthma Mother   . Ulcers  Mother   . Bipolar disorder Mother   . ADD / ADHD Father   . Diabetes Maternal Grandmother   . Diabetes Maternal Grandfather   . Colon cancer Neg Hx   . Liver disease Neg Hx     Social History Social History  Substance Use Topics  . Smoking status: Former Smoker    Packs/day: 0.00    Years: 2.00    Quit date: 10/06/2012  . Smokeless tobacco: Never Used     Comment: Never smoked cigarettes  . Alcohol use No     Comment: denies usage     Allergies   Amoxicillin-pot clavulanate; Ibuprofen; Omeprazole; Pineapple; Strawberry extract; and Tomato   Review of Systems Review of Systems  Constitutional: Negative for chills and fever.  HENT: Negative for sore throat.   Eyes: Negative for visual disturbance.  Respiratory: Negative for cough and shortness of breath.   Cardiovascular: Negative for chest pain and leg swelling.    Gastrointestinal: Positive for abdominal pain, nausea and vomiting. Negative for blood in stool, constipation and diarrhea.  Genitourinary: Negative for dysuria and frequency.  Musculoskeletal: Negative for back pain and neck pain.  Skin: Negative for rash.  Neurological: Negative for weakness, numbness and headaches.  Hematological: Negative for adenopathy.  Psychiatric/Behavioral: Negative for behavioral problems.     Physical Exam Updated Vital Signs BP 137/86   Pulse 80   Temp 98.2 F (36.8 C) (Oral)   Resp 18   Ht 5' 6"  (1.676 m)   Wt 104.3 kg (230 lb)   SpO2 99%   BMI 37.12 kg/m   Physical Exam  Constitutional: He appears well-developed and well-nourished. No distress.  HENT:  Head: Normocephalic and atraumatic.  Mouth/Throat: Oropharynx is clear and moist. No oropharyngeal exudate.  Eyes: Conjunctivae and EOM are normal. Pupils are equal, round, and reactive to light. Right eye exhibits no discharge. Left eye exhibits no discharge. No scleral icterus.  Neck: Normal range of motion. Neck supple. No JVD present. No thyromegaly present.  Cardiovascular: Normal rate, regular rhythm, normal heart sounds and intact distal pulses.  Exam reveals no gallop and no friction rub.   No murmur heard. Pulmonary/Chest: Effort normal and breath sounds normal. No respiratory distress. He has no wheezes. He has no rales.  Abdominal: Soft. Bowel sounds are normal. He exhibits no distension and no mass. There is no tenderness ( mild RUQ ttp without guarding or peritoneal signs and no Murphy's sign or other areas of ttp).  Genitourinary:  Genitourinary Comments: No CVA ttp  Musculoskeletal: Normal range of motion. He exhibits no edema or tenderness.  Lymphadenopathy:    He has no cervical adenopathy.  Neurological: He is alert. Coordination normal.  Skin: Skin is warm and dry. No rash noted. No erythema.  Psychiatric: He has a normal mood and affect. His behavior is normal.  Nursing note  and vitals reviewed.    ED Treatments / Results  Labs (all labs ordered are listed, but only abnormal results are displayed) Labs Reviewed  COMPREHENSIVE METABOLIC PANEL - Abnormal; Notable for the following:       Result Value   AST 511 (*)    ALT 496 (*)    All other components within normal limits  LIPASE, BLOOD  ETHANOL  URINALYSIS, ROUTINE W REFLEX MICROSCOPIC    Radiology Ct Abdomen Pelvis W Contrast  Result Date: 12/12/2016 CLINICAL DATA:  Subacute onset of nausea, vomiting and right upper quadrant abdominal pain. Initial encounter. EXAM: CT ABDOMEN AND PELVIS WITH  CONTRAST TECHNIQUE: Multidetector CT imaging of the abdomen and pelvis was performed using the standard protocol following bolus administration of intravenous contrast. CONTRAST:  171m ISOVUE-300 IOPAMIDOL (ISOVUE-300) INJECTION 61% COMPARISON:  CT of the abdomen and pelvis performed 12/02/2016, and right upper quadrant ultrasound performed 12/04/2016 FINDINGS: Lower chest: The visualized lung bases are grossly clear. The visualized portions of the mediastinum are unremarkable. Hepatobiliary: The liver is unremarkable in appearance. The gallbladder is unremarkable in appearance. The common bile duct remains normal in caliber. Pancreas: The pancreas is within normal limits. Spleen: The spleen is unremarkable in appearance. Adrenals/Urinary Tract: The adrenal glands are unremarkable in appearance. The kidneys are within normal limits. There is no evidence of hydronephrosis. No renal or ureteral stones are identified. No perinephric stranding is seen. Stomach/Bowel: The stomach is unremarkable in appearance. The small bowel is within normal limits. The appendix is normal in caliber, without evidence of appendicitis. The colon is unremarkable in appearance. Vascular/Lymphatic: The abdominal aorta is unremarkable in appearance. The inferior vena cava is grossly unremarkable. No retroperitoneal lymphadenopathy is seen. No pelvic  sidewall lymphadenopathy is identified. Reproductive: The bladder is decompressed and not well characterized. The prostate remains normal in size. Other: No additional soft tissue abnormalities are seen. Musculoskeletal: No acute osseous abnormalities are identified. The visualized musculature is unremarkable in appearance. IMPRESSION: Unremarkable contrast-enhanced CT of the abdomen and pelvis. Electronically Signed   By: JGarald BaldingM.D.   On: 12/12/2016 03:27    Procedures Procedures (including critical care time)  Medications Ordered in ED Medications  sodium chloride 0.9 % bolus 1,000 mL (0 mLs Intravenous Stopped 12/13/16 2016)  ondansetron (ZOFRAN) injection 4 mg (4 mg Intravenous Given 12/13/16 1837)  sodium chloride 0.9 % bolus 1,000 mL (0 mLs Intravenous Stopped 12/13/16 2103)     Initial Impression / Assessment and Plan / ED Course  I have reviewed the triage vital signs and the nursing notes.  Pertinent labs & imaging results that were available during my care of the patient were reviewed by me and considered in my medical decision making (see chart for details).   the patient's symptoms are ongoing, he does not appear to be in any distress and his vital signs are unremarkable. I will give him symptomatic control of his symptoms as well as rechecking liver function to make sure he is not to Compazine. He is already scheduled for the next further test within the next week, I do not feel the need to expedite these tests at the time. There is no ultrasound capabilities today. He does not have an exam that yields itself to further imaging.  Labs unchanged Pt improved with fluids and antiemetics Eating chips on reevaluateion States feels better Given results Stable for d/c.  Final Clinical Impressions(s) / ED Diagnoses   Final diagnoses:  Nauseated  Transaminitis  Right upper quadrant abdominal pain    New Prescriptions New Prescriptions   ONDANSETRON (ZOFRAN ODT) 4 MG  DISINTEGRATING TABLET    Take 1 tablet (4 mg total) by mouth every 8 (eight) hours as needed for nausea.     MNoemi Chapel MD 12/13/16 2134

## 2016-12-13 NOTE — Discharge Instructions (Signed)
Your testing still shows a gradual elevation of your liver tests. There is no other acute findings. You may use Zofran as needed for nausea, drink plenty of fluids, return to your doctor for ongoing testing, make sure that you go to your test scheduled for the 11th and the 13th.  Return to the emergency department for severe or worsening pain fever or vomiting.

## 2016-12-17 ENCOUNTER — Encounter (HOSPITAL_COMMUNITY): Payer: Self-pay

## 2016-12-17 ENCOUNTER — Ambulatory Visit (HOSPITAL_COMMUNITY)
Admission: RE | Admit: 2016-12-17 | Discharge: 2016-12-17 | Disposition: A | Discharge status: Home / Self Care | Payer: Medicaid Other | Source: Ambulatory Visit | Attending: Gastroenterology | Admitting: Gastroenterology

## 2016-12-17 ENCOUNTER — Other Ambulatory Visit: Payer: Self-pay | Admitting: Radiology

## 2016-12-17 DIAGNOSIS — R1011 Right upper quadrant pain: Secondary | ICD-10-CM | POA: Diagnosis not present

## 2016-12-17 DIAGNOSIS — R110 Nausea: Secondary | ICD-10-CM | POA: Insufficient documentation

## 2016-12-17 MED ORDER — TECHNETIUM TC 99M MEBROFENIN IV KIT
5.0000 | PACK | Freq: Once | INTRAVENOUS | Status: AC | PRN
  Administered 2016-12-17: 5.5 via INTRAVENOUS

## 2016-12-17 NOTE — Progress Notes (Signed)
HIDA scan is normal. Await liver biopsy.

## 2016-12-17 NOTE — Progress Notes (Signed)
PT's mom is aware.

## 2016-12-19 ENCOUNTER — Encounter (HOSPITAL_COMMUNITY): Payer: Self-pay

## 2016-12-19 ENCOUNTER — Ambulatory Visit (HOSPITAL_COMMUNITY)
Admission: RE | Admit: 2016-12-19 | Discharge: 2016-12-19 | Disposition: A | Discharge status: Home / Self Care | Payer: Medicaid Other | Source: Ambulatory Visit | Attending: Gastroenterology | Admitting: Gastroenterology

## 2016-12-19 DIAGNOSIS — J45909 Unspecified asthma, uncomplicated: Secondary | ICD-10-CM | POA: Diagnosis not present

## 2016-12-19 DIAGNOSIS — Z881 Allergy status to other antibiotic agents status: Secondary | ICD-10-CM | POA: Diagnosis not present

## 2016-12-19 DIAGNOSIS — F909 Attention-deficit hyperactivity disorder, unspecified type: Secondary | ICD-10-CM | POA: Insufficient documentation

## 2016-12-19 DIAGNOSIS — Z888 Allergy status to other drugs, medicaments and biological substances status: Secondary | ICD-10-CM | POA: Diagnosis not present

## 2016-12-19 DIAGNOSIS — F419 Anxiety disorder, unspecified: Secondary | ICD-10-CM | POA: Diagnosis not present

## 2016-12-19 DIAGNOSIS — Z87891 Personal history of nicotine dependence: Secondary | ICD-10-CM | POA: Diagnosis not present

## 2016-12-19 DIAGNOSIS — Z79899 Other long term (current) drug therapy: Secondary | ICD-10-CM | POA: Diagnosis not present

## 2016-12-19 DIAGNOSIS — R7989 Other specified abnormal findings of blood chemistry: Secondary | ICD-10-CM | POA: Insufficient documentation

## 2016-12-19 DIAGNOSIS — K5190 Ulcerative colitis, unspecified, without complications: Secondary | ICD-10-CM | POA: Diagnosis not present

## 2016-12-19 DIAGNOSIS — K219 Gastro-esophageal reflux disease without esophagitis: Secondary | ICD-10-CM | POA: Insufficient documentation

## 2016-12-19 DIAGNOSIS — F319 Bipolar disorder, unspecified: Secondary | ICD-10-CM | POA: Insufficient documentation

## 2016-12-19 DIAGNOSIS — R945 Abnormal results of liver function studies: Secondary | ICD-10-CM

## 2016-12-19 DIAGNOSIS — R569 Unspecified convulsions: Secondary | ICD-10-CM | POA: Diagnosis not present

## 2016-12-19 DIAGNOSIS — H9190 Unspecified hearing loss, unspecified ear: Secondary | ICD-10-CM | POA: Insufficient documentation

## 2016-12-19 LAB — CBC
HCT: 37.6 % — ABNORMAL LOW (ref 39.0–52.0)
Hemoglobin: 12.7 g/dL — ABNORMAL LOW (ref 13.0–17.0)
MCH: 26.6 pg (ref 26.0–34.0)
MCHC: 33.8 g/dL (ref 30.0–36.0)
MCV: 78.7 fL (ref 78.0–100.0)
Platelets: 145 10*3/uL — ABNORMAL LOW (ref 150–400)
RBC: 4.78 MIL/uL (ref 4.22–5.81)
RDW: 17.6 % — ABNORMAL HIGH (ref 11.5–15.5)
WBC: 7.4 10*3/uL (ref 4.0–10.5)

## 2016-12-19 LAB — PROTIME-INR
INR: 1.32
Prothrombin Time: 16.4 seconds — ABNORMAL HIGH (ref 11.4–15.2)

## 2016-12-19 LAB — APTT: aPTT: 48 seconds — ABNORMAL HIGH (ref 24–36)

## 2016-12-19 MED ORDER — FENTANYL CITRATE (PF) 100 MCG/2ML IJ SOLN
INTRAMUSCULAR | Status: AC
  Filled 2016-12-19: qty 4

## 2016-12-19 MED ORDER — MIDAZOLAM HCL 2 MG/2ML IJ SOLN
INTRAMUSCULAR | Status: AC | PRN
  Administered 2016-12-19: 1 mg via INTRAVENOUS

## 2016-12-19 MED ORDER — FENTANYL CITRATE (PF) 100 MCG/2ML IJ SOLN
INTRAMUSCULAR | Status: AC | PRN
  Administered 2016-12-19: 50 ug via INTRAVENOUS

## 2016-12-19 MED ORDER — SODIUM CHLORIDE 0.9 % IV SOLN
INTRAVENOUS | Status: AC | PRN
  Administered 2016-12-19: 10 mL/h via INTRAVENOUS

## 2016-12-19 MED ORDER — SODIUM CHLORIDE 0.9 % IV SOLN: INTRAVENOUS | Status: DC

## 2016-12-19 MED ORDER — MIDAZOLAM HCL 2 MG/2ML IJ SOLN
INTRAMUSCULAR | Status: AC
  Filled 2016-12-19: qty 4

## 2016-12-19 MED ORDER — LIDOCAINE HCL (PF) 1 % IJ SOLN
INTRAMUSCULAR | Status: AC
  Filled 2016-12-19: qty 30

## 2016-12-19 NOTE — H&P (Signed)
Chief Complaint: Patient was seen in consultation today for liver biopsy at the request of Wayne Avila  Referring Physician(s): Wayne Avila Dr Wayne Avila  Supervising Physician: Wayne Avila  Patient Status: [facility] - Out-pt  History of Present Illness: Wayne Avila is a 26 y.o. male   Hx ulcerative colitis Remicade treatment Noted new liver enzyme increase  6/28: [place]: Mildly increased hepatic echotexture likely reflects fatty infiltrative change. HIDA: normal  Scheduled for random liver biopsy per Dr Wayne Avila  Past Medical History:  Diagnosis Date  . ADHD (attention deficit hyperactivity disorder)   . Anxiety   . Asthma   . Bipolar 1 disorder ([place])   . Clostridium difficile colitis   . Depression   . GERD (gastroesophageal reflux disease)   . HOH (hard of hearing)   . Mental developmental delay   . Seizures ([place])     Past Surgical History:  Procedure Laterality Date  . ADENOIDECTOMY    . BIOPSY N/A 12/07/2012   Procedure: GASTRIC BIOPSIES;  Surgeon: Wayne Binder, MD;  Location: [facility];  Service: Endoscopy;  Laterality: N/A;  . BIOPSY N/A 08/30/2013   Procedure: BIOPSY;  Surgeon: Wayne Binder, MD;  Location: [facility];  Service: Endoscopy;  Laterality: N/A;  right colon,transverse colon, left colon, rectal biopsies  . BIOPSY  11/20/2015   Procedure: BIOPSY;  Surgeon: Wayne Binder, MD;  Location: [facility];  Service: Endoscopy;;  ileal, right colon biopsy, left colon, rectum  . BIOPSY  04/18/2016   Procedure: BIOPSY;  Surgeon: Wayne Dolin, MD;  Location: [facility];  Service: Endoscopy;;  left descending colon biopsies  . COLONOSCOPY  11/20/2015   Dr. Oneida Avila: Severe erythema, edema, ulcers from the anal verge to 20 cm above the anal verge without mucosal sparing, remaining colon and terminal ileum appeared normal. Biopsies from the rectum revealed fulminant active chronic colitis consistent with IBD. Pathology from terminal ileum revealed intramucosal  lymphoid aggregates. Remaining colon random biopsies with inactive chronic colitis  . COLONOSCOPY WITH PROPOFOL N/A 08/30/2013   UTM:LYYTKP mucosa in the terminal ileum/COLITIS/ MILD PROCTITIS. 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  Marland Kitchen COLONOSCOPY WITH PROPOFOL N/A 11/20/2015   Dr. 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 ESOPHAGOGASTRODUODENOSCOPY  11/20/2015   Dr. Oneida Avila: Normal exam, stomach biopsied and duodenal biopsy.. Benign biopsies.  . ESOPHAGOGASTRODUODENOSCOPY (EGD) WITH PROPOFOL N/A 12/07/2012   SLF:The mucosa of the esophagus appeared normal Non-erosive gastritis (inflammation) was found in the gastric antrum; multiple biopsies The duodenal mucosa showed no abnormalities in the bulb and second portion of the duodenum  . ESOPHAGOGASTRODUODENOSCOPY (EGD) WITH PROPOFOL N/A 11/20/2015   Dr. Oneida Avila: normal with normal biopsies, negative H.pylori   . ESOPHAGOGASTRODUODENOSCOPY (EGD) WITH PROPOFOL N/A 04/18/2016   Procedure: ESOPHAGOGASTRODUODENOSCOPY (EGD) WITH PROPOFOL;  Surgeon: Wayne Dolin, MD;  Location[facility];  Service: Endoscopy;  Laterality: N/A;  . FLEXIBLE SIGMOIDOSCOPY N/A 04/18/2016   Procedure: FLEXIBLE SIGMOIDOSCOPY;  Surgeon: Wayne Dolin, MD;  Location: [facility];  Service: Endoscopy;  Laterality: N/A;  . TONSILLECTOMY      Allergies: Amoxicillin-pot clavulanate; Ibuprofen; Omeprazole; Pineapple; Strawberry extract; and Tomato  Medications: Prior to Admission medications   Medication Sig Start Date End Date Taking? Authorizing Provider  atomoxetine (STRATTERA) 100 MG capsule Take 100 mg by mouth daily.    Yes [provider]  cholecalciferol (VITAMIN D) 1000 units tablet Take 1 tablet (1,000 Units total) by mouth daily. START AFTER VITAMIN D 50,000 U TABLETS ARE COMPLETE Patient taking differently:  Take 1,000 Units by mouth daily.  04/08/16  Yes [name], Wayne L, MD  cloNIDine (CATAPRES) 0.1 MG tablet Take 0.1-0.2 mg by mouth at bedtime.  11/06/16  Yes [provider]  divalproex (DEPAKOTE) 500 MG DR tablet Take 1 tablet (500 mg total) by mouth 2 (two) times daily. 07/03/15  Yes [name], [name], MD  loratadine (CLARITIN) 10 MG tablet Take 10 mg by mouth daily.   Yes [provider]  montelukast (SINGULAIR) 10 MG tablet Take 1 tablet (10 mg total) by mouth at bedtime. 11/13/14  Yes [name], NP  sertraline (ZOLOFT) 100 MG tablet Take 1 tablet (100 mg total) by mouth daily. 11/13/14  Yes [name], NP  traZODone (DESYREL) 100 MG tablet Take 100 mg by mouth at bedtime.  09/10/16  Yes [provider]  albuterol (PROVENTIL HFA;VENTOLIN HFA) 108 (90 Base) MCG/ACT inhaler Inhale 2 puffs into the lungs every 4 (four) hours as needed for wheezing or shortness of breath (and/or cough).    [provider]  albuterol (PROVENTIL) (2.5 MG/3ML) 0.083% nebulizer solution Inhale 2.5 mg into the lungs every 6 (six) hours as needed for wheezing or shortness of breath.     [provider]  EPINEPHrine (EPIPEN 2-PAK) 0.3 mg/0.3 mL IJ SOAJ injection Inject 0.3 mg into the muscle once as needed (for severe allergic reaction).    [provider]  ondansetron (ZOFRAN ODT) 4 MG disintegrating tablet Take 1 tablet (4 mg total) by mouth every 8 (eight) hours as needed for nausea. 12/13/16   Wayne Chapel, MD     Family History  Problem Relation Age of Onset  . Asthma Mother   . Ulcers Mother   . Bipolar disorder Mother   . ADD / ADHD Father   . Diabetes Maternal Grandmother   . Diabetes Maternal Grandfather   . Colon cancer Neg Hx   . Liver disease Neg Hx     Social History   Social History  . Marital status: Single    Spouse name: N/A  . Number of children: 0  . Years of education: 12th   Occupational History  .  Not Employed    not working    Social History Main Topics  . Smoking status: Former Smoker    Packs/day: 0.00    Years: 2.00    Quit date: 10/06/2012  . Smokeless tobacco: Never Used     Comment: Never smoked cigarettes  . Alcohol use No     Comment: denies usage  . Drug use: No  . Sexual activity: Yes   Other Topics Concern  . None   Social History Narrative   Patient lives at home with girlfriend and family.   Caffeine Use: occasionally    Review of Systems: A 12 point ROS discussed and pertinent positives are indicated in the HPI above.  All other systems are negative.  Review of Systems  Constitutional: Negative for activity change and fatigue.  Respiratory: Negative for cough and shortness of breath.   Neurological: Negative for weakness.  Psychiatric/Behavioral: Negative for behavioral problems and confusion.    Vital Signs: BP 122/88   Pulse 95   Temp 98.4 F (36.9 C) (Oral)   Ht 5' 8"  (1.727 m)   Wt 231 lb (104.8 kg)   SpO2 100%   BMI  35.12 kg/m   Physical Exam  Constitutional: He is oriented to person, place, and time.  Cardiovascular: Normal rate and regular rhythm.   Pulmonary/Chest: Effort normal and breath sounds normal.  Abdominal: Soft. Bowel sounds are normal.  Musculoskeletal: Normal range of motion.  Neurological: He is alert and oriented to person, place, and time.  Skin: Skin is warm and dry.  Psychiatric: He has a normal mood and affect. His behavior is normal. Judgment and thought content normal.  Nursing note and vitals reviewed.   Mallampati Score:  MD Evaluation Airway: WNL Heart: WNL Abdomen: WNL Chest/ Lungs: WNL ASA  Classification: 2 Mallampati/Airway Score: One  Imaging: Ct Abdomen Pelvis W Contrast  Result Date: 12/12/2016 CLINICAL DATA:  Subacute onset of nausea, vomiting and right upper quadrant abdominal pain. Initial encounter. EXAM: CT ABDOMEN AND PELVIS WITH CONTRAST TECHNIQUE: Multidetector CT imaging of the abdomen and pelvis was performed  using the standard protocol following bolus administration of intravenous contrast. CONTRAST:  166m ISOVUE-300 IOPAMIDOL (ISOVUE-300) INJECTION 61% COMPARISON:  CT of the abdomen and pelvis performed 12/02/2016, and right upper quadrant ultrasound performed 12/04/2016 FINDINGS: Lower chest: The visualized lung bases are grossly clear. The visualized portions of the mediastinum are unremarkable. Hepatobiliary: The liver is unremarkable in appearance. The gallbladder is unremarkable in appearance. The common bile duct remains normal in caliber. Pancreas: The pancreas is within normal limits. Spleen: The spleen is unremarkable in appearance. Adrenals/Urinary Tract: The adrenal glands are unremarkable in appearance. The kidneys are within normal limits. There is no evidence of hydronephrosis. No renal or ureteral stones are identified. No perinephric stranding is seen. Stomach/Bowel: The stomach is unremarkable in appearance. The small bowel is within normal limits. The appendix is normal in caliber, without evidence of appendicitis. The colon is unremarkable in appearance. Vascular/Lymphatic: The abdominal aorta is unremarkable in appearance. The inferior vena cava is grossly unremarkable. No retroperitoneal lymphadenopathy is seen. No pelvic sidewall lymphadenopathy is identified. Reproductive: The bladder is decompressed and not well characterized. The prostate remains normal in size. Other: No additional soft tissue abnormalities are seen. Musculoskeletal: No acute osseous abnormalities are identified. The visualized musculature is unremarkable in appearance. IMPRESSION: Unremarkable contrast-enhanced CT of the abdomen and pelvis. Electronically Signed   By: JGarald BaldingM.D.   On: 12/12/2016 03:27   Ct Abdomen Pelvis W Contrast  Result Date: 12/02/2016 CLINICAL DATA:  Diffuse abdominal pain. Frequent bowel movements today. EXAM: CT ABDOMEN AND PELVIS WITH CONTRAST TECHNIQUE: Multidetector CT imaging of the  abdomen and pelvis was performed using the standard protocol following bolus administration of intravenous contrast. CONTRAST:  1065mISOVUE-300 IOPAMIDOL (ISOVUE-300) INJECTION 61% COMPARISON:  05/24/2016 FINDINGS: Lower chest: No acute abnormality. Hepatobiliary: No focal liver abnormality is seen. No gallstones, gallbladder wall thickening, or biliary dilatation. Pancreas: Unremarkable. No pancreatic ductal dilatation or surrounding inflammatory changes. Spleen: Normal in size without focal abnormality. Adrenals/Urinary Tract: Adrenal glands are unremarkable. Kidneys are normal, without renal calculi, focal lesion, or hydronephrosis. Bladder is unremarkable. Stomach/Bowel: Stomach is within normal limits. Appendix appears normal. No evidence of bowel wall thickening, distention, or inflammatory changes. Vascular/Lymphatic: No significant vascular findings are present. No enlarged abdominal or pelvic lymph nodes. Reproductive: Unremarkable Other: No focal inflammation.  No ascites. Musculoskeletal: No significant skeletal lesion. IMPRESSION: No significant abnormality. Electronically Signed   By: DaAndreas Newport.D.   On: 12/02/2016 22:33   Nm Hepato W/eject Fract  Result Date: 12/17/2016 CLINICAL DATA:  RIGHT upper quadrant pain for few weeks, nausea after eating  EXAM: NUCLEAR MEDICINE HEPATOBILIARY IMAGING WITH GALLBLADDER EF TECHNIQUE: Sequential images of the abdomen were obtained out to 60 minutes following intravenous administration of radiopharmaceutical. After oral ingestion of Ensure, gallbladder ejection fraction was determined. At 60 min, normal ejection fraction is greater than 33%. RADIOPHARMACEUTICALS:  5.5 mCi Tc-82m Choletec IV COMPARISON:  None FINDINGS: Normal tracer extraction from bloodstream indicating normal hepatocellular function. Normal excretion of tracer into biliary tree. Gallbladder visualized at 21 min. Small bowel visualized at 12 min. No hepatic retention of tracer.  Subjectively normal emptying of tracer from gallbladder following fatty meal stimulation. Calculated gallbladder ejection fraction is 97%, normal. Patient reported no symptoms following Ensure ingestion. Normal gallbladder ejection fraction following Ensure ingestion is greater than 33% at 1 hour. IMPRESSION: Normal exam. Electronically Signed   By: MLavonia DanaM.D.   On: 12/17/2016 10:32   UKoreaAbdomen Limited Ruq  Result Date: 12/04/2016 CLINICAL DATA:  Two weeks of right upper quadrant pain with elevated liver function studies. History of ulcerative colitis. EXAM: ULTRASOUND ABDOMEN LIMITED RIGHT UPPER QUADRANT COMPARISON:  Abdominopelvic CT scan of December 02, 2016 FINDINGS: Gallbladder: No gallstones or wall thickening visualized. No sonographic Murphy sign noted by sonographer. Common bile duct: Diameter: 3.2 mm Liver: The hepatic echotexture is subjectively mildly increased. The surface contour is smooth. There is no focal mass or ductal dilation. IMPRESSION: No gallstones or sonographic evidence of acute cholecystitis. If there are clinical concerns of chronic gallbladder dysfunction, a nuclear medicine hepatobiliary scan with gallbladder ejection fraction determination may be useful. Mildly increased hepatic echotexture likely reflects fatty infiltrative change. Electronically Signed   By: David  JMartiniqueM.D.   On: 12/04/2016 11:45    Labs:  CBC:  Recent Labs  07/22/16 1320 10/15/16 2125 12/02/16 2111 12/12/16 0112 12/19/16 1131  WBC 5.3  --  7.4 8.7 7.4  HGB 10.6* 13.3 13.6 13.4 12.7*  HCT 32.2* 39.0 40.9 40.6 37.6*  PLT 295  --  206 227 145*    COAGS:  Recent Labs  12/04/16 1531  INR 1.1    BMP:  Recent Labs  07/22/16 1320 10/15/16 2125 12/02/16 2111 12/12/16 0112 12/13/16 1744  NA 140 140 135 140 140  K 3.8 3.9 3.7 4.1 4.7  CL 108 105 101 106 106  CO2 26  --  24 26 27   GLUCOSE 96 81 81 99 88  BUN 5* 9 7 7 8   CALCIUM 8.9  --  8.9 9.5 9.3  CREATININE 0.78 0.90 0.85  0.88 0.88  GFRNONAA >60  --  >60 >60 >60  GFRAA >60  --  >60 >60 >60    LIVER FUNCTION TESTS:  Recent Labs  12/04/16 1146 12/04/16 1531 12/12/16 0112 12/13/16 1744  BILITOT 0.3 0.3 0.9 1.1  AST 474* 478* 464* 511*  ALT 525* 571* 471* 496*  ALKPHOS 102 132* 111 107  PROT 7.1 7.5 7.6 7.4  ALBUMIN 3.3* 4.0 3.6 3.6    TUMOR MARKERS: No results for input(s): AFPTM, CEA, CA199, CHROMGRNA in the last 8760 hours.  Assessment and Plan:  Increased liver function tests Scheduled for random liver biopsy Risks and Benefits discussed with the patient including, but not limited to bleeding, infection, damage to adjacent structures or low yield requiring additional tests. All of the patient's questions were answered, patient is agreeable to proceed. Consent signed and in chart.   Thank you for this interesting consult.  I greatly enjoyed meeting KNAHZIR POHLEand look forward to participating in their care.  A copy of this report was sent to the requesting provider on this date.  Electronically Signed: Lavonia Drafts, PA-C 12/19/2016, 12:24 PM   I spent a total of  30 Minutes   in face to face in clinical consultation, greater than 50% of which was counseling/coordinating care for random liver biopsy

## 2016-12-19 NOTE — Sedation Documentation (Signed)
Patient is resting comfortably. 

## 2016-12-19 NOTE — Sedation Documentation (Signed)
Family updated as to patient's status.

## 2016-12-19 NOTE — Sedation Documentation (Signed)
Pt denies pain, VSS

## 2016-12-19 NOTE — Sedation Documentation (Signed)
Attempted to call [facility] to give report, they do not have a nurse available at this time. Pt request a drink and snack, provided to pt.

## 2016-12-19 NOTE — Sedation Documentation (Signed)
Report given to Pam, RN.

## 2016-12-19 NOTE — Procedures (Signed)
Random Liver core Bx 18 g times four EBL 0 Comp 0

## 2016-12-19 NOTE — Discharge Instructions (Signed)
Nonalcoholic Fatty Liver Disease Diet Nonalcoholic fatty liver disease is a condition that causes fat to accumulate in and around the liver. The disease makes it harder for the liver to work the way that it should. Following a healthy diet can help to keep nonalcoholic fatty liver disease under control. It can also help to prevent or improve conditions that are associated with the disease, such as heart disease, diabetes, high blood pressure, and abnormal cholesterol levels. Along with regular exercise, this diet:  Promotes weight loss.  Helps to control blood sugar levels.  Helps to improve the way that the body uses insulin.  What do I need to know about this diet?  Use the glycemic index (GI) to plan your meals. The index tells you how quickly a food will raise your blood sugar. Choose low-GI foods. These foods take a longer time to raise blood sugar.  Keep track of how many calories you take in. Eating the right amount of calories will help you to achieve a healthy weight.  You may want to follow a Mediterranean diet. This diet includes a lot of vegetables, lean meats or fish, whole grains, fruits, and healthy oils and fats. What foods can I eat? Grains Whole grains, such as whole-wheat or whole-grain breads, crackers, tortillas, cereals, and pasta. Stone-ground whole wheat. Pumpernickel bread. Unsweetened oatmeal. Bulgur. Barley. Quinoa. Brown or wild rice. Corn or whole-wheat flour tortillas. Vegetables Lettuce. Spinach. Peas. Beets. Cauliflower. Cabbage. Broccoli. Carrots. Tomatoes. Squash. Eggplant. Herbs. Peppers. Onions. Cucumbers. Brussels sprouts. Yams and sweet potatoes. Beans. Lentils. Fruits Bananas. Apples. Oranges. Grapes. Papaya. Mango. Pomegranate. Kiwi. Grapefruit. Cherries. Meats and Other Protein Sources Seafood and shellfish. Lean meats. Poultry. Tofu. Dairy Low-fat or fat-free dairy products, such as yogurt, cottage cheese, and cheese. Beverages Water. Sugar-free  drinks. Tea. Coffee. Low-fat or skim milk. Milk alternatives, such as soy or almond milk. Real fruit juice. Condiments Mustard. Relish. Low-fat, low-sugar ketchup and barbecue sauce. Low-fat or fat-free mayonnaise. Sweets and Desserts Sugar-free sweets. Fats and Oils Avocado. Canola or olive oil. Nuts and nut butters. Seeds. The items listed above may not be a complete list of recommended foods or beverages. Contact your dietitian for more options. What foods are not recommended? Palm oil and coconut oil. Processed foods. Fried foods. Sweetened drinks, such as sweet tea, milkshakes, snow cones, iced sweet drinks, and sodas. Alcohol. Sweets. Foods that contain a lot of salt or sodium. The items listed above may not be a complete list of foods and beverages to avoid. Contact your dietitian for more information. This information is not intended to replace advice given to you by your health care provider. Make sure you discuss any questions you have with your health care provider. Document Released: 10/10/2014 Document Revised: 11/01/2015 Document Reviewed: 06/20/2014 Elsevier Interactive Patient Education  2018 [place] Biopsy, Care After This sheet gives you information about how to care for yourself after your procedure. Your health care provider may also give you more specific instructions depending on the type of biopsy you had. If you have problems or questions, contact your health care provider. What can I expect after the procedure? After the procedure, it is common to have:  A cough.  A sore throat.  Pain where a needle, bronchoscope, or incision was used to collect a biopsy sample (biopsy site).  Follow these instructions at home: Medicines  Take over-the-counter and prescription medicines only as told by your health care provider.  Do not drive for 24 hours if you  were given a sedative.  Do not drink alcohol while taking pain medicine.  Do not drive or use heavy  machinery while taking prescription pain medicine.  To prevent or treat constipation while you are taking prescription pain medicine, your health care provider may recommend that you: ? Drink enough fluid to keep your urine clear or pale yellow. ? Take over-the-counter or prescription medicines. ? Eat foods that are high in fiber, such as fresh fruits and vegetables, whole grains, and beans. ? Limit foods that are high in fat and processed sugars, such as fried and sweet foods. Activity  If you had an incision during your procedure, avoid activities that may pull the incision site open.  Return to your normal activities as told by your health care provider. Ask your health care provider what activities are safe for you. If you had an open biopsy:   Follow instructions from your health care provider about how to take care of your incision. Make sure you: ? Wash your hands with soap and water before you change your bandage (dressing). If soap and water are not available, use hand sanitizer. ? Change your dressing as told by your health care provider. ? Leave stitches (sutures), skin glue, or adhesive strips in place. These skin closures may need to stay in place for 2 weeks or longer. If adhesive strip edges start to loosen and curl up, you may trim the loose edges. Do not remove adhesive strips completely unless your health care provider tells you to do that.  Check your incision area every day for signs of infection. Check for: ? Redness, swelling, or pain. ? Fluid or blood. ? Warmth. ? Pus or a bad smell. General instructions  It is up to you to get the results of your procedure. Ask your health care provider, or the department that is doing the procedure, when your results will be ready. Contact a health care provider if:  You have a fever.  You have redness, swelling, or pain around your biopsy site.  You have fluid or blood coming from your biopsy site.  Your biopsy site feels  warm to the touch.  You have pus or a bad smell coming from your biopsy site. Get help right away if:  You cough up blood.  You have trouble breathing.  You have chest pain. Summary  After the procedure, it is common to have a sore throat and a cough.  Return to your normal activities as told by your health care provider. Ask your health care provider what activities are safe for you.  Take over-the-counter and prescription medicines only as told by your health care provider.  Report any unusual symptoms to your health care provider. This information is not intended to replace advice given to you by your health care provider. Make sure you discuss any questions you have with your health care provider. Document Released: 06/24/2016 Document Revised: 06/24/2016 Document Reviewed: 06/24/2016 Elsevier Interactive Patient Education  [employer].

## 2016-12-20 ENCOUNTER — Emergency Department (HOSPITAL_COMMUNITY)
Admission: EM | Admit: 2016-12-20 | Discharge: 2016-12-20 | Disposition: A | Discharge status: Home / Self Care | Payer: Medicaid Other | Attending: Emergency Medicine | Admitting: Emergency Medicine

## 2016-12-20 ENCOUNTER — Encounter (HOSPITAL_COMMUNITY): Payer: Self-pay | Admitting: Emergency Medicine

## 2016-12-20 DIAGNOSIS — G8918 Other acute postprocedural pain: Secondary | ICD-10-CM | POA: Diagnosis present

## 2016-12-20 DIAGNOSIS — Z87891 Personal history of nicotine dependence: Secondary | ICD-10-CM | POA: Insufficient documentation

## 2016-12-20 DIAGNOSIS — J45909 Unspecified asthma, uncomplicated: Secondary | ICD-10-CM | POA: Diagnosis not present

## 2016-12-20 DIAGNOSIS — Z79899 Other long term (current) drug therapy: Secondary | ICD-10-CM | POA: Insufficient documentation

## 2016-12-20 MED ORDER — HYDROCODONE-ACETAMINOPHEN 5-325 MG PO TABS
2.0000 | ORAL_TABLET | Freq: Once | ORAL | Status: AC
  Administered 2016-12-20: 2 via ORAL
  Filled 2016-12-20: qty 2

## 2016-12-20 NOTE — ED Triage Notes (Signed)
Pt c/o pain at liver biopsy sight right upper abdomen. Pt c/o worsening pain when taking a deep breath.

## 2016-12-20 NOTE — ED Provider Notes (Signed)
[facility] DEPT Provider Note   CSN: [id number] Arrival date & time: 12/20/16  1114     History   Chief Complaint Chief Complaint  Patient presents with  . Pain    HPI Wayne Avila is a 26 y.o. male.  HPI  The patient is a 26 year old male, I have recently seen this patient for his transaminitis, he has been seen multiple times for this, has had persistent vomiting and abdominal pain for weeks, had a biopsy yesterday and now complains that he has pain at the biopsy site.  Past Medical History:  Diagnosis Date  . ADHD (attention deficit hyperactivity disorder)   . Anxiety   . Asthma   . Bipolar 1 disorder ([place])   . Clostridium difficile colitis   . Depression   . GERD (gastroesophageal reflux disease)   . HOH (hard of hearing)   . Mental developmental delay   . Seizures [facility])     Patient Active Problem List   Diagnosis Date Noted  . Clostridium difficile colitis 05/24/2016  . Acute diarrhea 05/22/2016  . Abnormal thyroid function test 03/24/2016  . Acute blood loss anemia 03/23/2016  . C. difficile colitis 03/22/2016  . Hypovolemia 03/22/2016  . Bipolar I disorder ([place]) 03/22/2016  . Ulcerative colitis ([place]) 12/29/2015  . CAP (community acquired pneumonia)   . Aspiration pneumonitis ([place]) 11/21/2015  . Bipolar I disorder, most recent episode depressed ([place])   . Bipolar 1 disorder, manic, moderate ([place]) 11/09/2014  . Suicidal ideation   . Major depressive disorder, recurrent episode, moderate ([place])   . Oral thrush 02/23/2014  . RUQ pain 08/09/2013  . Mental developmental delay 11/15/2012    Past Surgical History:  Procedure Laterality Date  . ADENOIDECTOMY    . BIOPSY N/A 12/07/2012   Procedure: GASTRIC BIOPSIES;  Surgeon: [name], MD;  Location: [facility] ORS;  Service: Endoscopy;  Laterality: N/A;  . BIOPSY N/A 08/30/2013   Procedure: BIOPSY;  Surgeon: [name], MD;  Location: [facility] ORS;  Service: Endoscopy;  Laterality: N/A;  right colon,transverse  colon, left colon, rectal biopsies  . BIOPSY  11/20/2015   Procedure: BIOPSY;  Surgeon: [name], MD;  Location: [facility] ENDO SU[facility];  Service: Endoscopy;;  ileal, right colon biopsy, left colon, rectum  . BIOPSY  04/18/2016   Procedure: BIOPSY;  Surgeon: [name], MD;  Location: [facility];  Service: Endoscopy;;  left descending colon biopsies  . COLONOSCOPY  11/20/2015   Dr. [name]: Severe erythema, edema, ulcers from the anal verge to 20 cm above the anal verge without mucosal sparing, remaining colon and terminal ileum appeared normal. Biopsies from the rectum revealed fulminant active chronic colitis consistent with IBD. Pathology from terminal ileum revealed intramucosal lymphoid aggregates. Remaining colon random biopsies with inactive chronic colitis  . COLONOSCOPY WITH PROPOFOL N/A 08/30/2013   NWG:NFAOZH mucosa in the terminal ileum/COLITIS/ MILD PROCTITIS. 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  Marland Kitchen COLONOSCOPY WITH PROPOFOL N/A 11/20/2015   Dr. 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 ESOPHAGOGASTRODUODENOSCOPY  11/20/2015   Dr. [name]: Normal exam, stomach biopsied and duodenal biopsy.. Benign biopsies.  . ESOPHAGOGASTRODUODENOSCOPY (EGD) WITH PROPOFOL N/A 12/07/2012   SLF:The mucosa of the esophagus appeared normal Non-erosive gastritis (inflammation) was found in the gastric antrum; multiple biopsies The duodenal mucosa showed no abnormalities in the bulb and second portion of the duodenum  .  ESOPHAGOGASTRODUODENOSCOPY (EGD) WITH PROPOFOL N/A 11/20/2015   Dr. [name]: normal with normal biopsies, negative H.pylori   . ESOPHAGOGASTRODUODENOSCOPY (EGD) WITH PROPOFOL N/A 04/18/2016   Procedure: ESOPHAGOGASTRODUODENOSCOPY (EGD) WITH PROPOFOL;  Surgeon: [name], MD;  Location[facility];  Service: Endoscopy;  Laterality: N/A;    . FLEXIBLE SIGMOIDOSCOPY N/A 04/18/2016   Procedure: FLEXIBLE SIGMOIDOSCOPY;  Surgeon: [name], MD;  Location: [facility];  Service: Endoscopy;  Laterality: N/A;  . TONSILLECTOMY         Home Medications    Prior to Admission medications   Medication Sig Start Date End Date Taking? Authorizing Provider  albuterol (PROVENTIL HFA;VENTOLIN HFA) 108 (90 Base) MCG/ACT inhaler Inhale 2 puffs into the lungs every 4 (four) hours as needed for wheezing or shortness of breath (and/or cough).    [provider]  albuterol (PROVENTIL) (2.5 MG/3ML) 0.083% nebulizer solution Inhale 2.5 mg into the lungs every 6 (six) hours as needed for wheezing or shortness of breath.     [provider]  atomoxetine (STRATTERA) 100 MG capsule Take 100 mg by mouth daily.     [provider]  cholecalciferol (VITAMIN D) 1000 units tablet Take 1 tablet (1,000 Units total) by mouth daily. START AFTER VITAMIN D 50,000 U TABLETS ARE COMPLETE Patient taking differently: Take 1,000 Units by mouth daily.  04/08/16   Fields, Marga Melnick, MD  cloNIDine (CATAPRES) 0.1 MG tablet Take 0.1-0.2 mg by mouth at bedtime.  11/06/16   [provider]  divalproex (DEPAKOTE) 500 MG DR tablet Take 1 tablet (500 mg total) by mouth 2 (two) times daily. 07/03/15   [name], [name], MD  EPINEPHrine (EPIPEN 2-PAK) 0.3 mg/0.3 mL IJ SOAJ injection Inject 0.3 mg into the muscle once as needed (for severe allergic reaction).    [provider]  loratadine (CLARITIN) 10 MG tablet Take 10 mg by mouth daily.    [provider]  montelukast (SINGULAIR) 10 MG tablet Take 1 tablet (10 mg total) by mouth at bedtime. 11/13/14   [name], NP  ondansetron (ZOFRAN ODT) 4 MG disintegrating tablet Take 1 tablet (4 mg total) by mouth every 8 (eight) hours as needed for nausea. 12/13/16   [name], MD  sertraline (ZOLOFT) 100 MG tablet Take 1 tablet (100 mg total) by mouth daily. 11/13/14   [name], NP  traZODone (DESYREL) 100 MG tablet Take 100 mg by mouth at bedtime.  09/10/16   [provider]    Family History Family History  Problem Relation Age of Onset  . Asthma Mother   . Ulcers Mother   . Bipolar disorder Mother   . ADD / ADHD Father   . Diabetes Maternal Grandmother   . Diabetes Maternal Grandfather   . Colon cancer Neg Hx   . Liver disease Neg Hx     Social History Social History  Substance Use Topics  . Smoking status: Former Smoker    Packs/day: 0.00    Years: 2.00    Quit date: 10/06/2012  . Smokeless tobacco: Never Used     Comment: Never smoked cigarettes  . Alcohol use No     Comment: denies usage     Allergies   Amoxicillin-pot clavulanate; Ibuprofen; Omeprazole; Pineapple; Strawberry extract; and Tomato   Review of Systems Review of Systems  Constitutional: Negative for fever.  Gastrointestinal: Positive for abdominal pain.     Physical Exam Updated Vital Signs BP (!) 125/91   Pulse  95   Temp 98.5 F (36.9 C)   Resp 18   Ht 5' 8"  (1.727 m)   Wt 104.8 kg (231 lb)   SpO2 98%   BMI 35.12 kg/m   Physical Exam  Constitutional: He appears well-developed and well-nourished.  HENT:  Head: Normocephalic and atraumatic.  Eyes: Conjunctivae are normal. Right eye exhibits no discharge. Left eye exhibits no discharge.  Pulmonary/Chest: Effort normal. No respiratory distress.  Abdominal: He exhibits no distension and no mass. There is tenderness ( RUQ - mild, similar top rior exam). There is no rebound and no guarding.  Neurological: He is alert. Coordination normal.  Skin: Skin is warm and dry. No rash noted. He is not diaphoretic. No erythema.  Band-Aid overlying the puncture site from the biopsy on the right anterior lower chest wall. No bleeding, no surrounding redness though there is tenderness over the site  Psychiatric: He has a normal mood and affect.  Nursing note and vitals reviewed.    ED Treatments / Results   Labs (all labs ordered are listed, but only abnormal results are displayed) Labs Reviewed - No data to display  EKG  EKG Interpretation None       Radiology US Biopsy  Result Date: 12/19/2016 INDICATION: Abnormal LFTs EXAM: ULTRASOUND-GUIDED RANDOM LIVER CORE BIOPSY. MEDICATIONS: None. ANESTHESIA/SEDATION: Fentanyl 50 mcg IV; Versed 1 mg IV Moderate Sedation Time:  10 The patient was continuously monitored during the procedure by the interventional radiology nurse under my direct supervision. FLUOROSCOPY TIME:  None COMPLICATIONS: None immediate. PROCEDURE: Informed written consent was obtained from the patient after a thorough discussion of the procedural risks, benefits and alternatives. All questions were addressed. Maximal Sterile Barrier Technique was utilized including caps, mask, sterile gowns, sterile gloves, sterile drape, hand hygiene and skin antiseptic. A timeout was performed prior to the initiation of the procedure. The right flank was prepped with ChloraPrep in a sterile fashion, and a sterile drape was applied covering the operative field. A sterile gown and sterile gloves were used for the procedure. Under sonographic guidance, an 17 gauge guide needle was advanced into the right lobe of the liver. Subsequently 4 18 gauge core biopsies were obtained. The guide needle was removed. Final imaging was performed. Patient tolerated the procedure well without complication. Vital sign monitoring by nursing staff during the procedure will continue as patient is in the special procedures unit for post procedure observation. FINDINGS: The images document guide needle placement within the right lobe of the liver. Post biopsy images demonstrate no hemorrhage. IMPRESSION: Successful ultrasound-guided core biopsy within the right lobe of the liver. Electronically Signed   By: [name] M.D.   On: 12/19/2016 16:56    Procedures Procedures (including critical care time)  Medications Ordered  in ED Medications  HYDROcodone-acetaminophen (NORCO/VICODIN) 5-325 MG per tablet 2 tablet (not administered)     Initial Impression / Assessment and Plan / ED Course  I have reviewed the triage vital signs and the nursing notes.  Pertinent labs & imaging results that were available during my care of the patient were reviewed by me and considered in my medical decision making (see chart for details).     Liver and gallbladder and right upper quadrant visualized on ultrasound, no signs of bleeding or blood or subcutaneous hematoma. Patient informed this was postoperative pain expected. No fever, stable for discharge  Final Clinical Impressions(s) / ED Diagnoses   Final diagnoses:  Post-operative pain    New Prescriptions New Prescriptions  No medications on file     [name], MD 12/20/16 1221

## 2016-12-20 NOTE — ED Notes (Signed)
Pt had a liver biopsy yesterday and reports pain today- he reports it is worse when he takes a deep breath

## 2016-12-20 NOTE — Discharge Instructions (Signed)
You will have pain after having a biopsy, if you should have ongoing or severe pain, develop any fevers he should be seen in the emergency department again. Otherwise follow-up with your doctor for further evaluation.

## 2016-12-22 ENCOUNTER — Telehealth: Payer: Self-pay

## 2016-12-22 NOTE — Telephone Encounter (Signed)
PT's mom called to say he had liver biopsy on Friday. His nausea and vomiting is worse and the Zofran nor the phenergan are helping. Please advise!

## 2016-12-23 ENCOUNTER — Encounter (HOSPITAL_COMMUNITY): Payer: Self-pay | Admitting: Emergency Medicine

## 2016-12-23 ENCOUNTER — Emergency Department (HOSPITAL_COMMUNITY): Payer: Medicaid Other

## 2016-12-23 ENCOUNTER — Emergency Department (HOSPITAL_COMMUNITY)
Admission: EM | Admit: 2016-12-23 | Discharge: 2016-12-23 | Disposition: A | Discharge status: Home / Self Care | Payer: Medicaid Other | Attending: Emergency Medicine | Admitting: Emergency Medicine

## 2016-12-23 DIAGNOSIS — Z79899 Other long term (current) drug therapy: Secondary | ICD-10-CM | POA: Insufficient documentation

## 2016-12-23 DIAGNOSIS — J45909 Unspecified asthma, uncomplicated: Secondary | ICD-10-CM | POA: Insufficient documentation

## 2016-12-23 DIAGNOSIS — F909 Attention-deficit hyperactivity disorder, unspecified type: Secondary | ICD-10-CM | POA: Diagnosis not present

## 2016-12-23 DIAGNOSIS — Z87891 Personal history of nicotine dependence: Secondary | ICD-10-CM | POA: Insufficient documentation

## 2016-12-23 DIAGNOSIS — R7401 Elevation of levels of liver transaminase levels: Secondary | ICD-10-CM

## 2016-12-23 DIAGNOSIS — R1011 Right upper quadrant pain: Secondary | ICD-10-CM | POA: Diagnosis not present

## 2016-12-23 DIAGNOSIS — R740 Nonspecific elevation of levels of transaminase and lactic acid dehydrogenase [LDH]: Secondary | ICD-10-CM | POA: Diagnosis not present

## 2016-12-23 DIAGNOSIS — R109 Unspecified abdominal pain: Secondary | ICD-10-CM | POA: Diagnosis present

## 2016-12-23 LAB — COMPREHENSIVE METABOLIC PANEL
ALT: 459 U/L — ABNORMAL HIGH (ref 17–63)
AST: 533 U/L — ABNORMAL HIGH (ref 15–41)
Albumin: 3.5 g/dL (ref 3.5–5.0)
Alkaline Phosphatase: 142 U/L — ABNORMAL HIGH (ref 38–126)
Anion gap: 8 (ref 5–15)
BUN: 5 mg/dL — ABNORMAL LOW (ref 6–20)
CO2: 24 mmol/L (ref 22–32)
Calcium: 8.9 mg/dL (ref 8.9–10.3)
Chloride: 102 mmol/L (ref 101–111)
Creatinine, Ser: 0.94 mg/dL (ref 0.61–1.24)
GFR calc Af Amer: >60 mL/min (ref >60)
GFR calc non Af Amer: >60 mL/min (ref >60)
Glucose, Bld: 108 mg/dL — ABNORMAL HIGH (ref 65–99)
Potassium: 4.3 mmol/L (ref 3.5–5.1)
Sodium: 134 mmol/L — ABNORMAL LOW (ref 135–145)
Total Bilirubin: 1.4 mg/dL — ABNORMAL HIGH (ref 0.3–1.2)
Total Protein: 7.9 g/dL (ref 6.5–8.1)

## 2016-12-23 LAB — URINALYSIS, ROUTINE W REFLEX MICROSCOPIC
Bacteria, UA: NONE SEEN
Glucose, UA: NEGATIVE mg/dL
Hgb urine dipstick: NEGATIVE
Ketones, ur: NEGATIVE mg/dL
Leukocytes, UA: NEGATIVE
Nitrite: NEGATIVE
Protein, ur: 100 mg/dL — AB
Specific Gravity, Urine: 1.027 (ref 1.005–1.030)
pH: 5 (ref 5.0–8.0)

## 2016-12-23 LAB — CBC
HCT: 38.8 % — ABNORMAL LOW (ref 39.0–52.0)
Hemoglobin: 13.1 g/dL (ref 13.0–17.0)
MCH: 26.8 pg (ref 26.0–34.0)
MCHC: 33.8 g/dL (ref 30.0–36.0)
MCV: 79.3 fL (ref 78.0–100.0)
Platelets: 163 10*3/uL (ref 150–400)
RBC: 4.89 MIL/uL (ref 4.22–5.81)
RDW: 18.9 % — ABNORMAL HIGH (ref 11.5–15.5)
WBC: 7.8 10*3/uL (ref 4.0–10.5)

## 2016-12-23 LAB — I-STAT CG4 LACTIC ACID, ED: Lactic Acid, Venous: 1.5 mmol/L (ref 0.5–1.9)

## 2016-12-23 LAB — LIPASE, BLOOD: Lipase: 28 U/L (ref 11–51)

## 2016-12-23 MED ORDER — OXYCODONE HCL 5 MG PO TABS: 5.0000 mg | ORAL_TABLET | ORAL | 0 refills | Status: DC | PRN

## 2016-12-23 MED ORDER — METOCLOPRAMIDE HCL 10 MG PO TABS: 10.0000 mg | ORAL_TABLET | Freq: Four times a day (QID) | ORAL | 0 refills | Status: DC | PRN

## 2016-12-23 NOTE — ED Notes (Signed)
Pt reports he is having the same sx "as usual", states having a liver bx on Friday. Mother reports he had a fever over 48 hours ago, none since.

## 2016-12-23 NOTE — Discharge Instructions (Signed)
The testing today was reassuring.  There are no changes in your liver today to be concerned about following the liver biopsy.  Continue to take your usual medications.  Avoid using Tylenol (acetaminophen), because it can irritate your liver.  Stop taking the Zofran and Phenergan, and used a new prescription nausea medicine, Reglan.  Return here, if needed, for problems.

## 2016-12-23 NOTE — Telephone Encounter (Signed)
Noted, no further recommendations at this time.

## 2016-12-23 NOTE — ED Triage Notes (Signed)
Patient's mother states patient had liver biopsy performed on Friday and is now complaining of fever along with worsening abdominal pain starting 3 days ago. Also complaining of vomiting. Patient was sent from Dr [name] office today.

## 2016-12-23 NOTE — Telephone Encounter (Signed)
Per Randall Hiss the patient needs to go to the ER to be evaluated

## 2016-12-23 NOTE — Telephone Encounter (Signed)
Routing to Walden Field, NP and Dr. [name].

## 2016-12-23 NOTE — ED Provider Notes (Signed)
[facility] DEPT Provider Note   CSN: [id number] Arrival date & time: 12/23/16  1219     History   Chief Complaint Chief Complaint  Patient presents with  . Abdominal Pain    HPI Wayne Avila is a 26 y.o. male.  He presents for evaluation of ongoing abdominal pain nausea, vomiting and fever.  Was present for 1 month.  He called his gastroenterologist who was concerned about the complaint of fever, today, and was therefore recommended to come to the emergency department.  Patient had a percutaneous liver biopsy done, 5 days ago, at [facility] in [facility].  This is his second visit in the emergency department, since the biopsy.  He has been taking Zofran and Phenergan, but is concerned that the combination of these medicines make him sleepy.  He is using Tylenol for pain.  He denies cough, chest pain, weakness or dizziness.  He is a somewhat poor historian.  There are no other known modifying factors.  HPI  Past Medical History:  Diagnosis Date  . ADHD (attention deficit hyperactivity disorder)   . Anxiety   . Asthma   . Bipolar 1 disorder ([place])   . Clostridium difficile colitis   . Depression   . GERD (gastroesophageal reflux disease)   . HOH (hard of hearing)   . Mental developmental delay   . Seizures [facility])     Patient Active Problem List   Diagnosis Date Noted  . Clostridium difficile colitis 05/24/2016  . Acute diarrhea 05/22/2016  . Abnormal thyroid function test 03/24/2016  . Acute blood loss anemia 03/23/2016  . C. difficile colitis 03/22/2016  . Hypovolemia 03/22/2016  . Bipolar I disorder ([place]) 03/22/2016  . Ulcerative colitis ([place]) 12/29/2015  . CAP (community acquired pneumonia)   . Aspiration pneumonitis ([place]) 11/21/2015  . Bipolar I disorder, most recent episode depressed ([place])   . Bipolar 1 disorder, manic, moderate ([place]) 11/09/2014  . Suicidal ideation   . Major depressive disorder, recurrent episode, moderate ([place])   . Oral  thrush 02/23/2014  . RUQ pain 08/09/2013  . Mental developmental delay 11/15/2012    Past Surgical History:  Procedure Laterality Date  . ADENOIDECTOMY    . BIOPSY N/A 12/07/2012   Procedure: GASTRIC BIOPSIES;  Surgeon: [name], MD;  Location: [facility] ORS;  Service: Endoscopy;  Laterality: N/A;  . BIOPSY N/A 08/30/2013   Procedure: BIOPSY;  Surgeon: [name], MD;  Location: [facility] ORS;  Service: Endoscopy;  Laterality: N/A;  right colon,transverse colon, left colon, rectal biopsies  . BIOPSY  11/20/2015   Procedure: BIOPSY;  Surgeon: [name], MD;  Location: [facility] ENDO SU[facility];  Service: Endoscopy;;  ileal, right colon biopsy, left colon, rectum  . BIOPSY  04/18/2016   Procedure: BIOPSY;  Surgeon: [name], MD;  Location: [facility];  Service: Endoscopy;;  left descending colon biopsies  . COLONOSCOPY  11/20/2015   Dr. [name]: Severe erythema, edema, ulcers from the anal verge to 20 cm above the anal verge without mucosal sparing, remaining colon and terminal ileum appeared normal. Biopsies from the rectum revealed fulminant active chronic colitis consistent with IBD. Pathology from terminal ileum revealed intramucosal lymphoid aggregates. Remaining colon random biopsies with inactive chronic colitis  . COLONOSCOPY WITH PROPOFOL N/A 08/30/2013   JIR:CVELFY mucosa in the terminal ileum/COLITIS/ MILD PROCTITIS. 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  Marland Kitchen COLONOSCOPY WITH  PROPOFOL N/A 11/20/2015   Dr. 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 ESOPHAGOGASTRODUODENOSCOPY  11/20/2015   Dr. [name]: Normal exam, stomach biopsied and duodenal biopsy.. Benign biopsies.  . ESOPHAGOGASTRODUODENOSCOPY (EGD) WITH PROPOFOL N/A 12/07/2012   SLF:The mucosa of the esophagus appeared normal Non-erosive gastritis (inflammation) was found in the gastric  antrum; multiple biopsies The duodenal mucosa showed no abnormalities in the bulb and second portion of the duodenum  . ESOPHAGOGASTRODUODENOSCOPY (EGD) WITH PROPOFOL N/A 11/20/2015   Dr. [name]: normal with normal biopsies, negative H.pylori   . ESOPHAGOGASTRODUODENOSCOPY (EGD) WITH PROPOFOL N/A 04/18/2016   Procedure: ESOPHAGOGASTRODUODENOSCOPY (EGD) WITH PROPOFOL;  Surgeon: [name], MD;  Location[facility];  Service: Endoscopy;  Laterality: N/A;  . FLEXIBLE SIGMOIDOSCOPY N/A 04/18/2016   Procedure: FLEXIBLE SIGMOIDOSCOPY;  Surgeon: [name], MD;  Location: [facility];  Service: Endoscopy;  Laterality: N/A;  . TONSILLECTOMY         Home Medications    Prior to Admission medications   Medication Sig Start Date End Date Taking? Authorizing Provider  albuterol (PROVENTIL HFA;VENTOLIN HFA) 108 (90 Base) MCG/ACT inhaler Inhale 2 puffs into the lungs every 4 (four) hours as needed for wheezing or shortness of breath (and/or cough).    [provider]  albuterol (PROVENTIL) (2.5 MG/3ML) 0.083% nebulizer solution Inhale 2.5 mg into the lungs every 6 (six) hours as needed for wheezing or shortness of breath.     [provider]  atomoxetine (STRATTERA) 100 MG capsule Take 100 mg by mouth daily.     [provider]  cholecalciferol (VITAMIN D) 1000 units tablet Take 1 tablet (1,000 Units total) by mouth daily. START AFTER VITAMIN D 50,000 U TABLETS ARE COMPLETE Patient taking differently: Take 1,000 Units by mouth daily.  04/08/16   Fields, Marga Melnick, MD  cloNIDine (CATAPRES) 0.1 MG tablet Take 0.1-0.2 mg by mouth at bedtime.  11/06/16   [provider]  divalproex (DEPAKOTE) 500 MG DR tablet Take 1 tablet (500 mg total) by mouth 2 (two) times daily. 07/03/15   [name], [name], MD  EPINEPHrine (EPIPEN 2-PAK) 0.3 mg/0.3 mL IJ SOAJ injection Inject 0.3 mg into the muscle once as needed (for severe allergic reaction).    [provider]    loratadine (CLARITIN) 10 MG tablet Take 10 mg by mouth daily.    [provider]  metoCLOPramide (REGLAN) 10 MG tablet Take 1 tablet (10 mg total) by mouth every 6 (six) hours as needed for nausea (nausea/headache). 12/23/16   Daleen Bo, MD  montelukast (SINGULAIR) 10 MG tablet Take 1 tablet (10 mg total) by mouth at bedtime. 11/13/14   [name], NP  ondansetron (ZOFRAN ODT) 4 MG disintegrating tablet Take 1 tablet (4 mg total) by mouth every 8 (eight) hours as needed for nausea. 12/13/16   [name], MD  oxyCODONE (ROXICODONE) 5 MG immediate release tablet Take 1 tablet (5 mg total) by mouth every 4 (four) hours as needed for severe pain. 12/23/16   Daleen Bo, MD  sertraline (ZOLOFT) 100 MG tablet Take 1 tablet (100 mg total) by mouth daily. 11/13/14   [name], NP  traZODone (DESYREL) 100 MG tablet Take 100 mg by mouth at bedtime.  09/10/16   [provider]    Family History Family History  Problem Relation Age of Onset  . Asthma Mother   . Ulcers Mother   . Bipolar disorder Mother   . ADD /  ADHD Father   . Diabetes Maternal Grandmother   . Diabetes Maternal Grandfather   . Colon cancer Neg Hx   . Liver disease Neg Hx     Social History Social History  Substance Use Topics  . Smoking status: Former Smoker    Packs/day: 0.00    Years: 2.00    Quit date: 10/06/2012  . Smokeless tobacco: Never Used     Comment: Never smoked cigarettes  . Alcohol use No     Comment: denies usage     Allergies   Amoxicillin-pot clavulanate; Ibuprofen; Omeprazole; Pineapple; Strawberry extract; and Tomato   Review of Systems Review of Systems  All other systems reviewed and are negative.    Physical Exam Updated Vital Signs BP 109/73   Pulse 93   Temp 98.2 F (36.8 C) (Rectal)   Resp 14   Ht 5' 8"  (1.727 m)   Wt 104.8 kg (231 lb)   SpO2 100%   BMI 35.12 kg/m   Physical Exam  Constitutional: He is oriented to person, place, and time. He  appears well-developed. No distress.  Obese.  Poor eye contact.  HENT:  Head: Normocephalic and atraumatic.  Right Ear: External ear normal.  Left Ear: External ear normal.  Eyes: Pupils are equal, round, and reactive to light. Conjunctivae and EOM are normal.  Neck: Normal range of motion and phonation normal. Neck supple.  Cardiovascular: Normal rate, regular rhythm and normal heart sounds.   Pulmonary/Chest: Effort normal and breath sounds normal. No respiratory distress. He exhibits no bony tenderness.  Abdominal: Soft. He exhibits no mass. There is tenderness (Right upper quadrant, mild). There is no rebound and no guarding.  Musculoskeletal: Normal range of motion.  Neurological: He is alert and oriented to person, place, and time. No cranial nerve deficit or sensory deficit. He exhibits normal muscle tone. Coordination normal.  Skin: Skin is warm, dry and intact. No pallor.  Psychiatric: He has a normal mood and affect. His behavior is normal. Judgment and thought content normal.  Nursing note and vitals reviewed.    ED Treatments / Results  Labs (all labs ordered are listed, but only abnormal results are displayed) Labs Reviewed  COMPREHENSIVE METABOLIC PANEL - Abnormal; Notable for the following:       Result Value   Sodium 134 (*)    Glucose, Bld 108 (*)    BUN 5 (*)    AST 533 (*)    ALT 459 (*)    Alkaline Phosphatase 142 (*)    Total Bilirubin 1.4 (*)    All other components within normal limits  CBC - Abnormal; Notable for the following:    HCT 38.8 (*)    RDW 18.9 (*)    All other components within normal limits  URINALYSIS, ROUTINE W REFLEX MICROSCOPIC - Abnormal; Notable for the following:    Color, Urine AMBER (*)    APPearance HAZY (*)    Bilirubin Urine MODERATE (*)    Protein, ur 100 (*)    Squamous Epithelial / LPF 0-5 (*)    All other components within normal limits  CULTURE, BLOOD (ROUTINE X 2)  CULTURE, BLOOD (ROUTINE X 2)  URINE CULTURE   LIPASE, BLOOD  I-STAT CG4 LACTIC ACID, ED  I-STAT CG4 LACTIC ACID, ED    EKG  EKG Interpretation None       Radiology Dg Chest Port 1 View  Result Date: 12/23/2016 CLINICAL DATA:  Abdominal pain. EXAM: PORTABLE CHEST 1 VIEW COMPARISON:  07/22/2016 FINDINGS: Very poor inspiration. Allowing for that, heart size is normal and the lungs are clear. No effusions. No free air seen. IMPRESSION: Expiratory film.  No active disease identified. Electronically Signed   By: Nelson Chimes M.D.   On: 12/23/2016 15:56    Procedures Procedures (including critical care time)  Medications Ordered in ED Medications - No data to display   Initial Impression / Assessment and Plan / ED Course  I have reviewed the triage vital signs and the nursing notes.  Pertinent labs & imaging results that were available during my care of the patient were reviewed by me and considered in my medical decision making (see chart for details).  Clinical Course as of Dec 23 1800  Tue Dec 23, 2016  1709 Review of chart indicates that the patient has had second-level serology testing for liver disease done, results are reported but there has been limited comment by the ordering gastroenterology provider.  On my review of the labs there are no clear indicators for unstable liver disorders.  The patient was referred, for liver biopsy because of lack of clear diagnosis on the second level testing.  The biopsy results are not yet reported.  [EW]    Clinical Course User Index [EW] Daleen Bo, MD     Patient Vitals for the past 24 hrs:  BP Temp Temp src Pulse Resp SpO2 Height Weight  12/23/16 1700 109/73 - - 93 14 100 % - -  12/23/16 1630 112/75 - - 90 12 99 % - -  12/23/16 1600 114/73 - - 79 15 100 % - -  12/23/16 1558 - - - 86 13 100 % - -  12/23/16 1557 106/73 - - - - - - -  12/23/16 1531 109/73 98.2 F (36.8 C) Rectal 92 20 100 % - -  12/23/16 1423 114/71 98.5 F (36.9 C) Oral 97 17 99 % - -  12/23/16 1222  133/80 99.1 F (37.3 C) Oral 97 18 97 % 5' 8"  (1.727 m) 104.8 kg (231 lb)    5:15 PM Reevaluation with update and discussion. After initial assessment and treatment, an updated evaluation reveals the patient is complaining of ongoing nausea and pain, at this time.  There are no indicators for causes of this, at this time.  Therefore will contact gastroenterology for advice on appropriate treatment, at this time. Hildred Pharo L   17: 55-as discussed with GI, Dr. Laural Golden, he is agreeable with discharge and symptomatic treatment at this time.  He will inform the patient's primary gastroenterologist about the visit, and the plan.   Final Clinical Impressions(s) / ED Diagnoses   Final diagnoses:  Right upper quadrant abdominal pain  Transaminitis   Ongoing right upper quadrant pain, now status post liver biopsy.  Reported fever but it has been present for 1 month according to the patient.  Evaluation for infection, does not reveal any signs or its presence.  Patient is stable for discharge and outpatient management in an ongoing fashion.  Note that his liver biopsy results have not returned yet.  Nursing Notes Reviewed/ Care Coordinated Applicable Imaging Reviewed Interpretation of Laboratory Data incorporated into ED treatment  The patient appears reasonably screened and/or stabilized for discharge and I doubt any other medical condition or other [facility] requiring further screening, evaluation, or treatment in the ED at this time prior to discharge.  Plan: Home Medications-continue usual medication, avoid using Tylenol; Home Treatments-rest, fluids; return here if the recommended treatment, does not improve the  symptoms; Recommended follow up-PCP and GI as needed, and as scheduled.   New Prescriptions New Prescriptions   METOCLOPRAMIDE (REGLAN) 10 MG TABLET    Take 1 tablet (10 mg total) by mouth every 6 (six) hours as needed for nausea (nausea/headache).   OXYCODONE (ROXICODONE) 5 MG IMMEDIATE  RELEASE TABLET    Take 1 tablet (5 mg total) by mouth every 4 (four) hours as needed for severe pain.     Daleen Bo, MD 12/23/16 [phone]

## 2016-12-23 NOTE — Telephone Encounter (Signed)
Patient's mom called in stating he's still having severe abd pain, n/v and fever.  He's denies having any rectal bleeding.  Patient can be reached at [phone].  Routing to Dr. [name]

## 2016-12-23 NOTE — Telephone Encounter (Signed)
I spoke with the patient's mom and made him aware the patient needed to proceed to the ER.  Mom stated they were in route to the ER.  I spoke with Magda Paganini, charge nurse for [facility] and made her aware that the patient will be coming in.

## 2016-12-24 NOTE — Telephone Encounter (Signed)
REVIEWED. IMAGING AND LABS REVIEWED FROM ED VISIT. LIVER Bx RESULT ARE PENDING. IF INDICATES ADVERSE DRUG REACTION. WILL D/C ENTYVIO. LAST DOSE JUL 2 AND REFER TO [facility] FOR TOTAL COLECTOMY WITH IPAA.

## 2016-12-25 ENCOUNTER — Telehealth: Payer: Self-pay | Admitting: Gastroenterology

## 2016-12-25 DIAGNOSIS — R740 Nonspecific elevation of levels of transaminase and lactic acid dehydrogenase [LDH]: Principal | ICD-10-CM

## 2016-12-25 DIAGNOSIS — R7401 Elevation of levels of liver transaminase levels: Secondary | ICD-10-CM

## 2016-12-25 LAB — URINE CULTURE: Culture: NO GROWTH

## 2016-12-25 NOTE — Telephone Encounter (Signed)
Pt's mother called to see if patient's liver bx results were back yet. Please call [phone]

## 2016-12-25 NOTE — Telephone Encounter (Signed)
PT's mom is aware I do not have those results back yet, but will call her when I do.

## 2016-12-26 NOTE — Telephone Encounter (Signed)
CALLED PATHOLOGY. THEY ARE LOOKING IT UP AND WILL CALL ME BACK TO LET ME KNOW WHEN RESULTS WILL BE FINAL.

## 2016-12-28 LAB — CULTURE, BLOOD (ROUTINE X 2)
Culture: NO GROWTH
Culture: NO GROWTH

## 2016-12-29 ENCOUNTER — Emergency Department (HOSPITAL_COMMUNITY): Payer: Medicaid Other

## 2016-12-29 ENCOUNTER — Encounter (HOSPITAL_COMMUNITY): Payer: Self-pay

## 2016-12-29 DIAGNOSIS — H9201 Otalgia, right ear: Secondary | ICD-10-CM | POA: Insufficient documentation

## 2016-12-29 DIAGNOSIS — X509XXA Other and unspecified overexertion or strenuous movements or postures, initial encounter: Secondary | ICD-10-CM | POA: Diagnosis not present

## 2016-12-29 DIAGNOSIS — J45909 Unspecified asthma, uncomplicated: Secondary | ICD-10-CM | POA: Diagnosis not present

## 2016-12-29 DIAGNOSIS — Y9389 Activity, other specified: Secondary | ICD-10-CM | POA: Insufficient documentation

## 2016-12-29 DIAGNOSIS — Y999 Unspecified external cause status: Secondary | ICD-10-CM | POA: Diagnosis not present

## 2016-12-29 DIAGNOSIS — Y92009 Unspecified place in unspecified non-institutional (private) residence as the place of occurrence of the external cause: Secondary | ICD-10-CM | POA: Diagnosis not present

## 2016-12-29 DIAGNOSIS — Z79899 Other long term (current) drug therapy: Secondary | ICD-10-CM | POA: Insufficient documentation

## 2016-12-29 DIAGNOSIS — R1011 Right upper quadrant pain: Secondary | ICD-10-CM | POA: Diagnosis not present

## 2016-12-29 DIAGNOSIS — Z87891 Personal history of nicotine dependence: Secondary | ICD-10-CM | POA: Insufficient documentation

## 2016-12-29 DIAGNOSIS — S46912A Strain of unspecified muscle, fascia and tendon at shoulder and upper arm level, left arm, initial encounter: Secondary | ICD-10-CM | POA: Insufficient documentation

## 2016-12-29 DIAGNOSIS — S4992XA Unspecified injury of left shoulder and upper arm, initial encounter: Secondary | ICD-10-CM | POA: Diagnosis present

## 2016-12-29 NOTE — Telephone Encounter (Signed)
PLEASE CALL PT'S MOTHER. I SPOKE WITH PATHOLOGY(DR.KISH) JUL 20. PT HAS SEVERE HEPATITIS. HE IS  SENDING PATH SLIDES FOR SECOND OPINION AND SPECIAL STAINS, WHICH IS WHY THE RESULTS ARE NOT BACK YET.Marland Kitchen

## 2016-12-29 NOTE — ED Triage Notes (Signed)
Left shoulder pain after using left arm to get off cough this evening. Also complains of recurrent abdominal pain. Recently dx with hepatitis per mother.

## 2016-12-29 NOTE — Telephone Encounter (Signed)
PT's mom is aware.

## 2016-12-30 ENCOUNTER — Emergency Department (HOSPITAL_COMMUNITY)
Admission: EM | Admit: 2016-12-30 | Discharge: 2016-12-30 | Disposition: A | Discharge status: Home / Self Care | Payer: Medicaid Other | Attending: Emergency Medicine | Admitting: Emergency Medicine

## 2016-12-30 DIAGNOSIS — R1011 Right upper quadrant pain: Secondary | ICD-10-CM

## 2016-12-30 DIAGNOSIS — S46912A Strain of unspecified muscle, fascia and tendon at shoulder and upper arm level, left arm, initial encounter: Secondary | ICD-10-CM

## 2016-12-30 LAB — CBC WITH DIFFERENTIAL/PLATELET
Basophils Absolute: 0 10*3/uL (ref 0.0–0.1)
Basophils Relative: 0 %
Eosinophils Absolute: 0 10*3/uL (ref 0.0–0.7)
Eosinophils Relative: 1 %
HCT: 38.3 % — ABNORMAL LOW (ref 39.0–52.0)
Hemoglobin: 12.8 g/dL — ABNORMAL LOW (ref 13.0–17.0)
Lymphocytes Relative: 37 %
Lymphs Abs: 2.7 10*3/uL (ref 0.7–4.0)
MCH: 27.1 pg (ref 26.0–34.0)
MCHC: 33.4 g/dL (ref 30.0–36.0)
MCV: 81 fL (ref 78.0–100.0)
Monocytes Absolute: 0.7 10*3/uL (ref 0.1–1.0)
Monocytes Relative: 10 %
Neutro Abs: 3.8 10*3/uL (ref 1.7–7.7)
Neutrophils Relative %: 52 %
Platelets: 191 10*3/uL (ref 150–400)
RBC: 4.73 MIL/uL (ref 4.22–5.81)
RDW: 20.5 % — ABNORMAL HIGH (ref 11.5–15.5)
WBC: 7.3 10*3/uL (ref 4.0–10.5)

## 2016-12-30 LAB — COMPREHENSIVE METABOLIC PANEL
ALT: 377 U/L — ABNORMAL HIGH (ref 17–63)
AST: 608 U/L — ABNORMAL HIGH (ref 15–41)
Albumin: 3.2 g/dL — ABNORMAL LOW (ref 3.5–5.0)
Alkaline Phosphatase: 139 U/L — ABNORMAL HIGH (ref 38–126)
Anion gap: 7 (ref 5–15)
BUN: 5 mg/dL — ABNORMAL LOW (ref 6–20)
CO2: 27 mmol/L (ref 22–32)
Calcium: 9.3 mg/dL (ref 8.9–10.3)
Chloride: 103 mmol/L (ref 101–111)
Creatinine, Ser: 0.91 mg/dL (ref 0.61–1.24)
GFR calc Af Amer: >60 mL/min (ref >60)
GFR calc non Af Amer: >60 mL/min (ref >60)
Glucose, Bld: 89 mg/dL (ref 65–99)
Potassium: 4.5 mmol/L (ref 3.5–5.1)
Sodium: 137 mmol/L (ref 135–145)
Total Bilirubin: 2.1 mg/dL — ABNORMAL HIGH (ref 0.3–1.2)
Total Protein: 7.8 g/dL (ref 6.5–8.1)

## 2016-12-30 LAB — LIPASE, BLOOD: Lipase: 33 U/L (ref 11–51)

## 2016-12-30 NOTE — ED Provider Notes (Signed)
[facility] DEPT Provider Note   CSN: [id number] Arrival date & time: 12/29/16  2233     History   Chief Complaint Chief Complaint  Patient presents with  . Abdominal Pain  . Shoulder Injury    HPI Wayne Avila is a 26 y.o. male.  HPI  26 year old male with a history of bipolar and mental delay presents with left shoulder pain/injury. Earlier tonight he was getting up off the couch and when he pushed his hand down on the couch he felt a sudden pain in his left shoulder. No weakness or numbness. No chest pain or shortness of breath. Mom also states he's been having continued right upper quadrant pain. Seemed to be getting better but now is hurting again and he has had some vomiting earlier in the day. He has been told by his gastrologist that he has hepatitis although she does not know what kind or why. Also complaining of right ear yellow drainage and pain for 2 weeks.  Past Medical History:  Diagnosis Date  . ADHD (attention deficit hyperactivity disorder)   . Anxiety   . Asthma   . Bipolar 1 disorder ([place])   . Clostridium difficile colitis   . Depression   . GERD (gastroesophageal reflux disease)   . HOH (hard of hearing)   . Mental developmental delay   . Seizures [facility])     Patient Active Problem List   Diagnosis Date Noted  . Clostridium difficile colitis 05/24/2016  . Acute diarrhea 05/22/2016  . Abnormal thyroid function test 03/24/2016  . Acute blood loss anemia 03/23/2016  . C. difficile colitis 03/22/2016  . Hypovolemia 03/22/2016  . Bipolar I disorder ([place]) 03/22/2016  . Ulcerative colitis ([place]) 12/29/2015  . CAP (community acquired pneumonia)   . Aspiration pneumonitis ([place]) 11/21/2015  . Bipolar I disorder, most recent episode depressed ([place])   . Bipolar 1 disorder, manic, moderate ([place]) 11/09/2014  . Suicidal ideation   . Major depressive disorder, recurrent episode, moderate ([place])   . Oral thrush 02/23/2014  . RUQ pain 08/09/2013  . Mental  developmental delay 11/15/2012    Past Surgical History:  Procedure Laterality Date  . ADENOIDECTOMY    . BIOPSY N/A 12/07/2012   Procedure: GASTRIC BIOPSIES;  Surgeon: [name], MD;  Location: [facility] ORS;  Service: Endoscopy;  Laterality: N/A;  . BIOPSY N/A 08/30/2013   Procedure: BIOPSY;  Surgeon: [name], MD;  Location: [facility] ORS;  Service: Endoscopy;  Laterality: N/A;  right colon,transverse colon, left colon, rectal biopsies  . BIOPSY  11/20/2015   Procedure: BIOPSY;  Surgeon: [name], MD;  Location: [facility] END[facility] SU[facility];  Service: Endoscopy;;  ileal, right colon biopsy, left colon, rectum  . BIOPSY  04/18/2016   Procedure: BIOPSY;  Surgeon: [name], MD;  Location: [facility];  Service: Endoscopy;;  left descending colon biopsies  . COLONOSCOPY  11/20/2015   Dr. [name]: Severe erythema, edema, ulcers from the anal verge to 20 cm above the anal verge without mucosal sparing, remaining colon and terminal ileum appeared normal. Biopsies from the rectum revealed fulminant active chronic colitis consistent with IBD. Pathology from terminal ileum revealed intramucosal lymphoid aggregates. Remaining colon random biopsies with inactive chronic colitis  . COLONOSCOPY WITH PROPOFOL N/A 08/30/2013   WCB:JSEGBT mucosa in the terminal ileum/COLITIS/ MILD PROCTITIS. 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  Marland Kitchen COLONOSCOPY WITH PROPOFOL N/A 11/20/2015  Dr. 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 ESOPHAGOGASTRODUODENOSCOPY  11/20/2015   Dr. [name]: Normal exam, stomach biopsied and duodenal biopsy.. Benign biopsies.  . ESOPHAGOGASTRODUODENOSCOPY (EGD) WITH PROPOFOL N/A 12/07/2012   SLF:The mucosa of the esophagus appeared normal Non-erosive gastritis (inflammation) was found in the gastric antrum; multiple biopsies The duodenal mucosa showed no  abnormalities in the bulb and second portion of the duodenum  . ESOPHAGOGASTRODUODENOSCOPY (EGD) WITH PROPOFOL N/A 11/20/2015   Dr. [name]: normal with normal biopsies, negative H.pylori   . ESOPHAGOGASTRODUODENOSCOPY (EGD) WITH PROPOFOL N/A 04/18/2016   Procedure: ESOPHAGOGASTRODUODENOSCOPY (EGD) WITH PROPOFOL;  Surgeon: [name], MD;  Location: [facility];  Service: Endoscopy;  Laterality: N/A;  . FLEXIBLE SIGMOIDOSCOPY N/A 04/18/2016   Procedure: FLEXIBLE SIGMOIDOSCOPY;  Surgeon: [name], MD;  Location: [facility];  Service: Endoscopy;  Laterality: N/A;  . TONSILLECTOMY         Home Medications    Prior to Admission medications   Medication Sig Start Date End Date Taking? Authorizing Provider  albuterol (PROVENTIL HFA;VENTOLIN HFA) 108 (90 Base) MCG/ACT inhaler Inhale 2 puffs into the lungs every 4 (four) hours as needed for wheezing or shortness of breath (and/or cough).    [provider]  albuterol (PROVENTIL) (2.5 MG/3ML) 0.083% nebulizer solution Inhale 2.5 mg into the lungs every 6 (six) hours as needed for wheezing or shortness of breath.     [provider]  atomoxetine (STRATTERA) 100 MG capsule Take 100 mg by mouth daily.     [provider]  cholecalciferol (VITAMIN D) 1000 units tablet Take 1 tablet (1,000 Units total) by mouth daily. START AFTER VITAMIN D 50,000 U TABLETS ARE COMPLETE Patient taking differently: Take 1,000 Units by mouth daily.  04/08/16   Fields, Marga Melnick, MD  cloNIDine (CATAPRES) 0.1 MG tablet Take 0.1-0.2 mg by mouth at bedtime.  11/06/16   [provider]  divalproex (DEPAKOTE) 500 MG DR tablet Take 1 tablet (500 mg total) by mouth 2 (two) times daily. 07/03/15   [name], [name], MD  EPINEPHrine (EPIPEN 2-PAK) 0.3 mg/0.3 mL IJ SOAJ injection Inject 0.3 mg into the muscle once as needed (for severe allergic reaction).    [provider]  loratadine (CLARITIN) 10 MG tablet Take 10 mg by mouth  daily.    [provider]  metoCLOPramide (REGLAN) 10 MG tablet Take 1 tablet (10 mg total) by mouth every 6 (six) hours as needed for nausea (nausea/headache). 12/23/16   Daleen Bo, MD  montelukast (SINGULAIR) 10 MG tablet Take 1 tablet (10 mg total) by mouth at bedtime. 11/13/14   [name], NP  ondansetron (ZOFRAN ODT) 4 MG disintegrating tablet Take 1 tablet (4 mg total) by mouth every 8 (eight) hours as needed for nausea. 12/13/16   [name], MD  oxyCODONE (ROXICODONE) 5 MG immediate release tablet Take 1 tablet (5 mg total) by mouth every 4 (four) hours as needed for severe pain. 12/23/16   Daleen Bo, MD  sertraline (ZOLOFT) 100 MG tablet Take 1 tablet (100 mg total) by mouth daily. 11/13/14   [name], NP  traZODone (DESYREL) 100 MG tablet Take 100 mg by mouth at bedtime.  09/10/16   [provider]    Family History Family History  Problem Relation Age of Onset  . Asthma Mother   . Ulcers Mother   . Bipolar disorder Mother   . ADD / ADHD Father   .  Diabetes Maternal Grandmother   . Diabetes Maternal Grandfather   . Colon cancer Neg Hx   . Liver disease Neg Hx     Social History Social History  Substance Use Topics  . Smoking status: Former Smoker    Packs/day: 0.00    Years: 2.00    Quit date: 10/06/2012  . Smokeless tobacco: Never Used     Comment: Never smoked cigarettes  . Alcohol use No     Comment: denies usage     Allergies   Amoxicillin-pot clavulanate; Ibuprofen; Omeprazole; Pineapple; Strawberry extract; and Tomato   Review of Systems Review of Systems  Constitutional: Negative for fever.  Respiratory: Negative for shortness of breath.   Cardiovascular: Negative for chest pain.  Gastrointestinal: Positive for abdominal pain.  Musculoskeletal: Positive for arthralgias.  Neurological: Negative for weakness and numbness.  All other systems reviewed and are negative.    Physical Exam Updated Vital Signs BP 132/80    Pulse (!) 101   Temp 97.8 F (36.6 C) (Oral)   Resp 16   Ht 5' 8"  (1.727 m)   Wt 104.8 kg (231 lb)   SpO2 100%   BMI 35.12 kg/m   Physical Exam  Constitutional: He is oriented to person, place, and time. He appears well-developed and well-nourished. No distress.  HENT:  Head: Normocephalic and atraumatic.  Right Ear: Tympanic membrane normal.  Left Ear: External ear normal.  Ears:  Nose: Nose normal.  Eyes: Right eye exhibits no discharge. Left eye exhibits no discharge.  Neck: Neck supple.  Cardiovascular: Normal rate, regular rhythm and normal heart sounds.   Pulses:      Radial pulses are 2+ on the right side, and 2+ on the left side.  Pulmonary/Chest: Effort normal and breath sounds normal.  Abdominal: Soft. There is tenderness in the right upper quadrant.  Musculoskeletal: He exhibits no edema.       Left shoulder: He exhibits decreased range of motion and tenderness. He exhibits no deformity.  Left arm held adducted. Able to passively ROM shoulder though with pain. No deformity. Diffuse tenderness  Neurological: He is alert and oriented to person, place, and time.  Skin: Skin is warm and dry. He is not diaphoretic.  Nursing note and vitals reviewed.    ED Treatments / Results  Labs (all labs ordered are listed, but only abnormal results are displayed) Labs Reviewed  COMPREHENSIVE METABOLIC PANEL - Abnormal; Notable for the following:       Result Value   BUN 5 (*)    Albumin 3.2 (*)    AST 608 (*)    ALT 377 (*)    Alkaline Phosphatase 139 (*)    Total Bilirubin 2.1 (*)    All other components within normal limits  CBC WITH DIFFERENTIAL/PLATELET - Abnormal; Notable for the following:    Hemoglobin 12.8 (*)    HCT 38.3 (*)    RDW 20.5 (*)    All other components within normal limits  LIPASE, BLOOD    EKG  EKG Interpretation None       Radiology Dg Shoulder Left  Result Date: 12/29/2016 CLINICAL DATA:  Left shoulder pain EXAM: LEFT SHOULDER - 2+  VIEW COMPARISON:  05/08/2012 FINDINGS: There is no evidence of fracture or dislocation. There is no evidence of arthropathy or other focal bone abnormality. Soft tissues are unremarkable. IMPRESSION: Negative. Electronically Signed   By: Donavan Foil M.D.   On: 12/29/2016 23:48    Procedures Procedures (including critical care  time)  Medications Ordered in ED Medications - No data to display   Initial Impression / Assessment and Plan / ED Course  I have reviewed the triage vital signs and the nursing notes.  Pertinent labs & imaging results that were available during my care of the patient were reviewed by me and considered in my medical decision making (see chart for details).     Patient's shoulder pain appears to be a strain. He is neurovascular intact. He is able to move it, though it is painful. No fracture or dislocation on x-ray. At this point, I have recommended range of motion exercises to help prevent a frozen shoulder. His right upper quadrant pain is chronic for over one month. LFTs are slightly worse than one week ago but not significant enough to warrant immediate treatment. Continue to follow up with gastroenterology as an outpatient. Discussed return precautions. No vomiting while in the ED.  Final Clinical Impressions(s) / ED Diagnoses   Final diagnoses:  Strain of left shoulder, initial encounter  RUQ pain    New Prescriptions Discharge Medication List as of 12/30/2016  3:28 AM       Sherwood Gambler, MD 12/30/16 [phone]

## 2016-12-31 ENCOUNTER — Other Ambulatory Visit: Payer: Self-pay

## 2016-12-31 ENCOUNTER — Observation Stay (HOSPITAL_COMMUNITY)
Admission: EM | Admit: 2016-12-31 | Discharge: 2017-01-02 | Disposition: A | Discharge status: Home / Self Care | Payer: Medicaid Other | Attending: Internal Medicine | Admitting: Internal Medicine

## 2016-12-31 ENCOUNTER — Encounter (HOSPITAL_COMMUNITY): Payer: Self-pay | Admitting: Emergency Medicine

## 2016-12-31 DIAGNOSIS — R945 Abnormal results of liver function studies: Principal | ICD-10-CM

## 2016-12-31 DIAGNOSIS — F909 Attention-deficit hyperactivity disorder, unspecified type: Secondary | ICD-10-CM | POA: Insufficient documentation

## 2016-12-31 DIAGNOSIS — Z87891 Personal history of nicotine dependence: Secondary | ICD-10-CM | POA: Insufficient documentation

## 2016-12-31 DIAGNOSIS — R4182 Altered mental status, unspecified: Secondary | ICD-10-CM | POA: Diagnosis not present

## 2016-12-31 DIAGNOSIS — D649 Anemia, unspecified: Secondary | ICD-10-CM | POA: Diagnosis present

## 2016-12-31 DIAGNOSIS — R7989 Other specified abnormal findings of blood chemistry: Secondary | ICD-10-CM

## 2016-12-31 DIAGNOSIS — F819 Developmental disorder of scholastic skills, unspecified: Secondary | ICD-10-CM | POA: Diagnosis not present

## 2016-12-31 DIAGNOSIS — R1010 Upper abdominal pain, unspecified: Secondary | ICD-10-CM | POA: Diagnosis present

## 2016-12-31 DIAGNOSIS — E7220 Disorder of urea cycle metabolism, unspecified: Secondary | ICD-10-CM | POA: Diagnosis not present

## 2016-12-31 DIAGNOSIS — K51919 Ulcerative colitis, unspecified with unspecified complications: Secondary | ICD-10-CM | POA: Diagnosis present

## 2016-12-31 DIAGNOSIS — K754 Autoimmune hepatitis: Secondary | ICD-10-CM | POA: Diagnosis present

## 2016-12-31 DIAGNOSIS — J4520 Mild intermittent asthma, uncomplicated: Secondary | ICD-10-CM | POA: Diagnosis present

## 2016-12-31 DIAGNOSIS — K5190 Ulcerative colitis, unspecified, without complications: Secondary | ICD-10-CM | POA: Diagnosis present

## 2016-12-31 DIAGNOSIS — Z79899 Other long term (current) drug therapy: Secondary | ICD-10-CM | POA: Insufficient documentation

## 2016-12-31 LAB — CBC WITH DIFFERENTIAL/PLATELET
Basophils Absolute: 0 10*3/uL (ref 0.0–0.1)
Basophils Relative: 0 %
Eosinophils Absolute: 0.1 10*3/uL (ref 0.0–0.7)
Eosinophils Relative: 1 %
HCT: 36 % — ABNORMAL LOW (ref 39.0–52.0)
Hemoglobin: 12.4 g/dL — ABNORMAL LOW (ref 13.0–17.0)
Lymphocytes Relative: 34 %
Lymphs Abs: 2.7 10*3/uL (ref 0.7–4.0)
MCH: 27.6 pg (ref 26.0–34.0)
MCHC: 34.4 g/dL (ref 30.0–36.0)
MCV: 80 fL (ref 78.0–100.0)
Monocytes Absolute: 1 10*3/uL (ref 0.1–1.0)
Monocytes Relative: 13 %
Neutro Abs: 4.1 10*3/uL (ref 1.7–7.7)
Neutrophils Relative %: 52 %
Platelets: 189 10*3/uL (ref 150–400)
RBC: 4.5 MIL/uL (ref 4.22–5.81)
RDW: 20.6 % — ABNORMAL HIGH (ref 11.5–15.5)
WBC: 7.9 10*3/uL (ref 4.0–10.5)

## 2016-12-31 LAB — COMPREHENSIVE METABOLIC PANEL
ALT: 331 U/L — ABNORMAL HIGH (ref 17–63)
AST: 507 U/L — ABNORMAL HIGH (ref 15–41)
Albumin: 3.1 g/dL — ABNORMAL LOW (ref 3.5–5.0)
Alkaline Phosphatase: 154 U/L — ABNORMAL HIGH (ref 38–126)
Anion gap: 6 (ref 5–15)
BUN: <5 mg/dL — ABNORMAL LOW (ref 6–20)
CO2: 24 mmol/L (ref 22–32)
Calcium: 9.3 mg/dL (ref 8.9–10.3)
Chloride: 104 mmol/L (ref 101–111)
Creatinine, Ser: 0.8 mg/dL (ref 0.61–1.24)
GFR calc Af Amer: >60 mL/min (ref >60)
GFR calc non Af Amer: >60 mL/min (ref >60)
Glucose, Bld: 100 mg/dL — ABNORMAL HIGH (ref 65–99)
Potassium: 4.3 mmol/L (ref 3.5–5.1)
Sodium: 134 mmol/L — ABNORMAL LOW (ref 135–145)
Total Bilirubin: 2.1 mg/dL — ABNORMAL HIGH (ref 0.3–1.2)
Total Protein: 7.6 g/dL (ref 6.5–8.1)

## 2016-12-31 LAB — PROTIME-INR
INR: 1.36
Prothrombin Time: 16.9 seconds — ABNORMAL HIGH (ref 11.4–15.2)

## 2016-12-31 LAB — AMMONIA: Ammonia: 79 umol/L — ABNORMAL HIGH (ref 9–35)

## 2016-12-31 LAB — LIPASE, BLOOD: Lipase: 27 U/L (ref 11–51)

## 2016-12-31 LAB — ACETAMINOPHEN LEVEL: Acetaminophen (Tylenol), Serum: <10 ug/mL — ABNORMAL LOW (ref 10–30)

## 2016-12-31 LAB — APTT: aPTT: 52 seconds — ABNORMAL HIGH (ref 24–36)

## 2016-12-31 MED ORDER — ATOMOXETINE HCL 60 MG PO CAPS
100.0000 mg | ORAL_CAPSULE | Freq: Every day | ORAL | Status: DC
  Administered 2017-01-01 – 2017-01-02 (×2): 100 mg via ORAL
  Filled 2016-12-31 (×6): qty 1

## 2016-12-31 MED ORDER — OXYCODONE HCL 5 MG PO TABS
5.0000 mg | ORAL_TABLET | Freq: Four times a day (QID) | ORAL | Status: DC | PRN
  Administered 2016-12-31: 5 mg via ORAL
  Filled 2016-12-31: qty 1

## 2016-12-31 MED ORDER — CLONIDINE HCL 0.1 MG PO TABS
0.1000 mg | ORAL_TABLET | Freq: Every day | ORAL | Status: DC
Start: 2016-12-31 — End: 2017-01-02
  Administered 2016-12-31 – 2017-01-01 (×2): 0.1 mg via ORAL
  Filled 2016-12-31 (×2): qty 1

## 2016-12-31 MED ORDER — METOCLOPRAMIDE HCL 10 MG PO TABS: 10.0000 mg | ORAL_TABLET | Freq: Four times a day (QID) | ORAL | Status: DC | PRN

## 2016-12-31 MED ORDER — LACTULOSE 10 GM/15ML PO SOLN
20.0000 g | Freq: Three times a day (TID) | ORAL | Status: DC
  Administered 2016-12-31 – 2017-01-01 (×3): 20 g via ORAL
  Filled 2016-12-31 (×3): qty 30

## 2016-12-31 MED ORDER — DIVALPROEX SODIUM 250 MG PO DR TAB
500.0000 mg | DELAYED_RELEASE_TABLET | Freq: Two times a day (BID) | ORAL | Status: DC
  Administered 2016-12-31 – 2017-01-02 (×4): 500 mg via ORAL
  Filled 2016-12-31 (×4): qty 2

## 2016-12-31 MED ORDER — SERTRALINE HCL 50 MG PO TABS
100.0000 mg | ORAL_TABLET | Freq: Every day | ORAL | Status: DC
  Administered 2016-12-31 – 2017-01-02 (×3): 100 mg via ORAL
  Filled 2016-12-31 (×3): qty 2

## 2016-12-31 MED ORDER — LORATADINE 10 MG PO TABS
10.0000 mg | ORAL_TABLET | Freq: Every day | ORAL | Status: DC
  Administered 2016-12-31 – 2017-01-02 (×3): 10 mg via ORAL
  Filled 2016-12-31 (×3): qty 1

## 2016-12-31 MED ORDER — ALBUTEROL SULFATE (2.5 MG/3ML) 0.083% IN NEBU: 2.5000 mg | INHALATION_SOLUTION | Freq: Four times a day (QID) | RESPIRATORY_TRACT | Status: DC | PRN

## 2016-12-31 MED ORDER — SODIUM CHLORIDE 0.9 % IV BOLUS (SEPSIS)
1000.0000 mL | Freq: Once | INTRAVENOUS | Status: AC
  Administered 2016-12-31: 1000 mL via INTRAVENOUS

## 2016-12-31 MED ORDER — MONTELUKAST SODIUM 10 MG PO TABS
10.0000 mg | ORAL_TABLET | Freq: Every day | ORAL | Status: DC
  Administered 2016-12-31 – 2017-01-01 (×2): 10 mg via ORAL
  Filled 2016-12-31 (×2): qty 1

## 2016-12-31 MED ORDER — SODIUM CHLORIDE 0.9 % IV SOLN
INTRAVENOUS | Status: DC
  Administered 2016-12-31: 100 mL/h via INTRAVENOUS
  Administered 2017-01-01 – 2017-01-02 (×3): via INTRAVENOUS

## 2016-12-31 NOTE — Telephone Encounter (Signed)
Referral info faxed to [facility]. Marked as ASAP.

## 2016-12-31 NOTE — H&P (Signed)
History and Physical    Wayne Avila EYC:[id number] DOB: [birth year] DOA: 12/31/2016  PCP: Patient, No Pcp Per   Patient coming from: Home.  I have personally briefly reviewed patient's old medical records in [place]  Chief Complaint: Drowsiness and left shoulder pain.  HPI: Wayne Avila is a 26 y.o. male with medical history significant of ADHD, anxiety, mental developmental delay, bipolar 1 disorder, depression, asthma, history of Clostridium difficile colitis,  GERD, impaired hearing, seizures who was brought by his mother with complaints of left shoulder pain after using left arm to get off the couch earlier today, recurrent abdominal pain and drowsiness over the past few days. The patient is a poor historian. When seen, he was in no acute distress.He was recently diagnosed with unknown etiology hepatitis by Dr. [name]. Biopsy was done 12 days ago, but still pending.   ED Course: vital signs emergency department were normal. The patient is described as being sleepy, but arousable briefly, then goes back to sleep by Dr. Lita Mains ([id number] when seen on the med-surg floor). His workup in the emergency department shows WBC 7.9, hemoglobin 12.4 g/dL and platelets 489. Peripheral smear shows atypical lymphocytes. PT was 16.9 and INR 1.66. His sodium was 134, albumin 3.1, AST 507, ALT 331, alkaline phosphatase 154 and bilirubin 2.1 mg/dL. When compared to yesterday, transaminase are showing improvement.  Imaging: In the past 30 days, the patient has had 2 CT abdomen and pelvis with contrast, ultrasound of RUQ and HIDA scan which only showed mild hepatic steatosis. Please see images and full radiology report for further detail.  Review of Systems: As per HPI otherwise 10 point review of systems negative.    Past Medical History:  Diagnosis Date  . ADHD (attention deficit hyperactivity disorder)   . Anxiety   . Asthma   . Bipolar 1 disorder ([place])   . Clostridium difficile colitis   .  Depression   . GERD (gastroesophageal reflux disease)   . HOH (hard of hearing)   . Mental developmental delay   . Seizures ([place])     Past Surgical History:  Procedure Laterality Date  . ADENOIDECTOMY    . BIOPSY N/A 12/07/2012   Procedure: GASTRIC BIOPSIES;  Surgeon: [name], MD;  Location: [facility];  Service: Endoscopy;  Laterality: N/A;  . BIOPSY N/A 08/30/2013   Procedure: BIOPSY;  Surgeon: [name], MD;  Location: [facility];  Service: Endoscopy;  Laterality: N/A;  right colon,transverse colon, left colon, rectal biopsies  . BIOPSY  11/20/2015   Procedure: BIOPSY;  Surgeon: [name], MD;  Location: [facility];  Service: Endoscopy;;  ileal, right colon biopsy, left colon, rectum  . BIOPSY  04/18/2016   Procedure: BIOPSY;  Surgeon: [name], MD;  Location: [facility];  Service: Endoscopy;;  left descending colon biopsies  . COLONOSCOPY  11/20/2015   Dr. [name]: Severe erythema, edema, ulcers from the anal verge to 20 cm above the anal verge without mucosal sparing, remaining colon and terminal ileum appeared normal. Biopsies from the rectum revealed fulminant active chronic colitis consistent with IBD. Pathology from terminal ileum revealed intramucosal lymphoid aggregates. Remaining colon random biopsies with inactive chronic colitis  . COLONOSCOPY WITH PROPOFOL N/A 08/30/2013   JSH:FWYOVZ mucosa in the terminal ileum/COLITIS/ MILD PROCTITIS. 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  Marland Kitchen COLONOSCOPY WITH PROPOFOL N/A 11/20/2015   Dr.  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 ESOPHAGOGASTRODUODENOSCOPY  11/20/2015   Dr. [name]: Normal exam, stomach biopsied and duodenal biopsy.. Benign biopsies.  . ESOPHAGOGASTRODUODENOSCOPY (EGD) WITH PROPOFOL N/A 12/07/2012   SLF:The mucosa of the esophagus appeared normal Non-erosive  gastritis (inflammation) was found in the gastric antrum; multiple biopsies The duodenal mucosa showed no abnormalities in the bulb and second portion of the duodenum  . ESOPHAGOGASTRODUODENOSCOPY (EGD) WITH PROPOFOL N/A 11/20/2015   Dr. [name]: normal with normal biopsies, negative H.pylori   . ESOPHAGOGASTRODUODENOSCOPY (EGD) WITH PROPOFOL N/A 04/18/2016   Procedure: ESOPHAGOGASTRODUODENOSCOPY (EGD) WITH PROPOFOL;  Surgeon: [name], MD;  [facility];  Service: Endoscopy;  Laterality: N/A;  . FLEXIBLE SIGMOIDOSCOPY N/A 04/18/2016   Procedure: FLEXIBLE SIGMOIDOSCOPY;  Surgeon: [name], MD;  Location: [facility];  Service: Endoscopy;  Laterality: N/A;  . TONSILLECTOMY       reports that he quit smoking about 4 years ago. He smoked 0.00 packs per day for 2.00 years. He has never used smokeless tobacco. He reports that he does not drink alcohol or use drugs.  Allergies  Allergen Reactions  . Amoxicillin-Pot Clavulanate Nausea And Vomiting and Other (See Comments)    Has patient had a PCN reaction causing immediate rash, facial/tongue/throat swelling, SOB or lightheadedness with hypotension: No Has patient had a PCN reaction causing severe rash involving mucus membranes or skin necrosis: No Has patient had a PCN reaction that required hospitalization No Has patient had a PCN reaction occurring within the last 10 years: No If all of the above answers are "NO", then may proceed with Cephalosporin use.  . Ibuprofen Other (See Comments)    Per pts MD he is unable to take this medication.   . Omeprazole Nausea And Vomiting  . Pineapple Swelling and Other (See Comments)    Reaction:  Lip swelling  . Strawberry Extract Swelling and Other (See Comments)    Reaction:  Lip swelling  . Tomato Rash    Family History  Problem Relation Age of Onset  . Asthma Mother   . Ulcers Mother   . Bipolar disorder Mother   . ADD / ADHD Father   . Diabetes Maternal Grandmother   .  Diabetes Maternal Grandfather   . Colon cancer Neg Hx   . Liver disease Neg Hx      Prior to Admission medications   Medication Sig Start Date End Date Taking? Authorizing Provider  albuterol (PROVENTIL HFA;VENTOLIN HFA) 108 (90 Base) MCG/ACT inhaler Inhale 2 puffs into the lungs every 4 (four) hours as needed for wheezing or shortness of breath (and/or cough).   Yes [provider]  albuterol (PROVENTIL) (2.5 MG/3ML) 0.083% nebulizer solution Inhale 2.5 mg into the lungs every 6 (six) hours as needed for wheezing or shortness of breath.    Yes [provider]  atomoxetine (STRATTERA) 100 MG capsule Take 100 mg by mouth daily.    Yes [provider]  cholecalciferol (VITAMIN D) 1000 units tablet Take 1 tablet (1,000 Units total) by mouth daily. START AFTER VITAMIN D 50,000 U TABLETS ARE COMPLETE Patient taking differently: Take 1,000 Units by mouth daily.  04/08/16  Yes Fields, [name], MD  cloNIDine (CATAPRES) 0.1 MG tablet Take 0.1-0.2 mg by mouth at bedtime.  11/06/16  Yes [provider]  divalproex (DEPAKOTE) 500 MG DR tablet Take 1 tablet (500 mg total) by mouth 2 (two) times daily. 07/03/15  Yes [name], [name],  MD  EPINEPHrine (EPIPEN 2-PAK) 0.3 mg/0.3 mL IJ SOAJ injection Inject 0.3 mg into the muscle once as needed (for severe allergic reaction).   Yes [provider]  loratadine (CLARITIN) 10 MG tablet Take 10 mg by mouth daily.   Yes [provider]  metoCLOPramide (REGLAN) 10 MG tablet Take 1 tablet (10 mg total) by mouth every 6 (six) hours as needed for nausea (nausea/headache). 12/23/16  Yes Daleen Bo, MD  montelukast (SINGULAIR) 10 MG tablet Take 1 tablet (10 mg total) by mouth at bedtime. 11/13/14  Yes [name], NP  oxyCODONE (ROXICODONE) 5 MG immediate release tablet Take 1 tablet (5 mg total) by mouth every 4 (four) hours as needed for severe pain. 12/23/16  Yes Daleen Bo, MD  sertraline (ZOLOFT) 100 MG tablet  Take 1 tablet (100 mg total) by mouth daily. 11/13/14  Yes [name], NP  traZODone (DESYREL) 100 MG tablet Take 100 mg by mouth at bedtime.  09/10/16  Yes [provider]  ondansetron (ZOFRAN ODT) 4 MG disintegrating tablet Take 1 tablet (4 mg total) by mouth every 8 (eight) hours as needed for nausea. Patient not taking: Reported on 12/31/2016 12/13/16   [name], MD    Physical Exam: Vitals:   12/31/16 1403 12/31/16 1404 12/31/16 1430  BP: 124/78  134/88  Pulse: 97  91  Resp: 18    Temp: 98.5 F (36.9 C)    TempSrc: Oral    SpO2: 100%  97%  Weight:  104.8 kg (231 lb)   Height:  5' 8"  (1.727 m)     Constitutional: NAD, calm, comfortable Eyes: PERRL, Mildly icteric sclerae, lids and conjunctivae normal ENMT: Mucous membranes are moist. Posterior pharynx clear of any exudate or lesions. Neck: normal, supple, no masses, no thyromegaly Respiratory: clear to auscultation bilaterally, no wheezing, no crackles. Normal respiratory effort. No accessory muscle use.  Cardiovascular: Regular rate and rhythm, no murmurs / rubs / gallops. No extremity edema. 2+ pedal pulses. No carotid bruits.  Abdomen: Mildly distended. Soft, mild diffuse tenderness, no guarding/rebound/masses palpated. No hepatosplenomegaly. Bowel sounds positive.  Musculoskeletal: no clubbing / cyanosis. Good ROM, no contractures. Normal muscle tone.  Skin: no rashes, lesions, ulcers on limited skin exam. Neurologic: CN 2-12 grossly intact. Sensation intact, DTR normal. Strength 5/5 in all 4.  Psychiatric: Normal judgment and insight. Alert and oriented x 3. Normal mood.   Labs on Admission: I have personally reviewed following labs and imaging studies  CBC:  Recent Labs Lab 12/30/16 0224 12/31/16 1458  WBC 7.3 7.9  NEUTROABS 3.8 4.1  HGB 12.8* 12.4*  HCT 38.3* 36.0*  MCV 81.0 80.0  PLT 191 481   Basic Metabolic Panel:  Recent Labs Lab 12/30/16 0224 12/31/16 1458  NA 137 134*  K 4.5 4.3  CL  103 104  CO2 27 24  GLUCOSE 89 100*  BUN 5* <5*  CREATININE 0.91 0.80  CALCIUM 9.3 9.3   GFR: Estimated Creatinine Clearance: 167.2 mL/min (by C-G formula based on SCr of 0.8 mg/dL). Liver Function Tests:  Recent Labs Lab 12/30/16 0224 12/31/16 1458  AST 608* 507*  ALT 377* 331*  ALKPHOS 139* 154*  BILITOT 2.1* 2.1*  PROT 7.8 7.6  ALBUMIN 3.2* 3.1*    Recent Labs Lab 12/30/16 0224 12/31/16 1458  LIPASE 33 27    Recent Labs Lab 12/31/16 1503  AMMONIA 79*   Coagulation Profile:  Recent Labs Lab 12/31/16 1458  INR 1.36   Cardiac Enzymes:  No results for input(s): CKTOTAL, CKMB, CKMBINDEX, TROPONINI in the last 168 hours. BNP (last 3 results) No results for input(s): PROBNP in the last 8760 hours. HbA1C: No results for input(s): HGBA1C in the last 72 hours. CBG: No results for input(s): GLUCAP in the last 168 hours. Lipid Profile: No results for input(s): CHOL, HDL, LDLCALC, TRIG, CHOLHDL, LDLDIRECT in the last 72 hours. Thyroid Function Tests: No results for input(s): TSH, T4TOTAL, FREET4, T3FREE, THYROIDAB in the last 72 hours. Anemia Panel: No results for input(s): VITAMINB12, FOLATE, FERRITIN, TIBC, IRON, RETICCTPCT in the last 72 hours. Urine analysis:    Component Value Date/Time   COLORURINE AMBER (A) 12/23/2016 1225   APPEARANCEUR HAZY (A) 12/23/2016 1225   LABSPEC 1.027 12/23/2016 1225   PHURINE 5.0 12/23/2016 1225   GLUCOSEU NEGATIVE 12/23/2016 1225   HGBUR NEGATIVE 12/23/2016 1225   BILIRUBINUR MODERATE (A) 12/23/2016 1225   KETONESUR NEGATIVE 12/23/2016 1225   PROTEINUR 100 (A) 12/23/2016 1225   UROBILINOGEN 0.2 09/04/2014 2355   NITRITE NEGATIVE 12/23/2016 1225   LEUKOCYTESUR NEGATIVE 12/23/2016 1225    Radiological Exams on Admission: Dg Shoulder Left  Result Date: 12/29/2016 CLINICAL DATA:  Left shoulder pain EXAM: LEFT SHOULDER - 2+ VIEW COMPARISON:  05/08/2012 FINDINGS: There is no evidence of fracture or dislocation. There is no  evidence of arthropathy or other focal bone abnormality. Soft tissues are unremarkable. IMPRESSION: Negative. Electronically Signed   By: Donavan Foil M.D.   On: 12/29/2016 23:48    EKG: Independently reviewed  Assessment/Plan Principal Problem:   Altered mental status Likely due to hyperammonemia, may be medications. Observation/telemetry. Continue IV fluids. Lactulose for hyperammonemia. Limit narcotics and other CNS depressant medications. Dr. [name] will evaluate in the morning.  Active Problems:   Hyperammonemia (HCC) Lactulose 20 g by mouth 3 times a day. Check ammonia level in a.m.    Abnormal LFTs Improved from most recent follow-up. Recheck LFTs in the morning. Biopsy was done by Dr. [name], but results are still pending.    Bipolar I disorder, most recent episode depressed (HCC) Continue Strattera 100 mg by mouth daily. Continue Depakote 500 mg by mouth twice a day. Continue sertraline 100 mg by mouth daily.    Asthma Albuterol 2.5 mg nebs every 6 hours as needed.    Anemia Monitor hematocrit and hemoglobin.   Peripheral smear showed atypical lymphocytes. Consider follow-up as an outpatient with hematology.    DVT prophylaxis: SCDs. Code Status: Full code. Family Communication: His mother was in the emergency department. Disposition Plan: Observation for IV hydration, lactulose treatment and GI evaluation. Consults called: Dr. Trinda Pascal (gastroenterology) Admission status: Observation/telemetry.   Reubin Milan MD [facility] Pager [phone].  If 7PM-7AM, please contact night-coverage [email or web address] Password [email or web address]  12/31/2016, 4:51 PM

## 2016-12-31 NOTE — ED Triage Notes (Signed)
Pt reports recurring abd pain with intermittent vomiting.  Mother states pt recently dx with hepatitis, but no treatment.

## 2016-12-31 NOTE — Telephone Encounter (Signed)
Patient in the ED. PLEASE ARRANGE REFERRAL TO [facility] due to elevated LFTs, severe hepatitis on pathology and awaiting final pathology ASAP.

## 2016-12-31 NOTE — ED Provider Notes (Signed)
[facility] DEPT Provider Note   CSN: [id number] Arrival date & time: 12/31/16  1356     History   Chief Complaint Chief Complaint  Patient presents with  . Abdominal Pain    HPI MCCORMICK Wayne Avila is a 26 y.o. male.  HPI Patient percent with mother for ongoing abdominal pain. Patient has a history of ulcerative colitis and has recently had increased liver function tests. Biopsy performed by Dr. [name] earlier this month and are still awaiting results of that biopsy. Mother states patient had normal bowel movement this morning. No blood in the stool. No nausea or vomiting. No fever or chills. Patient has history of developmental delay and bipolar 1 disorder. Poor historian. Level V caveat applies.    Past Medical History:  Diagnosis Date  . ADHD (attention deficit hyperactivity disorder)   . Anxiety   . Asthma   . Bipolar 1 disorder ([place])   . Clostridium difficile colitis   . Depression   . GERD (gastroesophageal reflux disease)   . HOH (hard of hearing)   . Mental developmental delay   . Seizures [facility])     Patient Active Problem List   Diagnosis Date Noted  . Altered mental status 12/31/2016  . Asthma 12/31/2016  . Anemia 12/31/2016  . Hyperammonemia ([place]) 12/31/2016  . Abnormal LFTs 12/31/2016  . Clostridium difficile colitis 05/24/2016  . Acute diarrhea 05/22/2016  . Abnormal thyroid function test 03/24/2016  . Acute blood loss anemia 03/23/2016  . C. difficile colitis 03/22/2016  . Hypovolemia 03/22/2016  . Bipolar I disorder ([place]) 03/22/2016  . Ulcerative colitis ([place]) 12/29/2015  . CAP (community acquired pneumonia)   . Aspiration pneumonitis ([place]) 11/21/2015  . Bipolar I disorder, most recent episode depressed ([place])   . Bipolar 1 disorder, manic, moderate ([place]) 11/09/2014  . Suicidal ideation   . Major depressive disorder, recurrent episode, moderate ([place])   . Oral thrush 02/23/2014  . RUQ pain 08/09/2013  . Mental developmental delay 11/15/2012     Past Surgical History:  Procedure Laterality Date  . ADENOIDECTOMY    . BIOPSY N/A 12/07/2012   Procedure: GASTRIC BIOPSIES;  Surgeon: [name], MD;  Location: [facility] ORS;  Service: Endoscopy;  Laterality: N/A;  . BIOPSY N/A 08/30/2013   Procedure: BIOPSY;  Surgeon: [name], MD;  Location: [facility] ORS;  Service: Endoscopy;  Laterality: N/A;  right colon,transverse colon, left colon, rectal biopsies  . BIOPSY  11/20/2015   Procedure: BIOPSY;  Surgeon: [name], MD;  Location: [facility] SU[facility];  Service: Endoscopy;;  ileal, right colon biopsy, left colon, rectum  . BIOPSY  04/18/2016   Procedure: BIOPSY;  Surgeon: [name], MD;  Location: [facility];  Service: Endoscopy;;  left descending colon biopsies  . COLONOSCOPY  11/20/2015   Dr. [name]: Severe erythema, edema, ulcers from the anal verge to 20 cm above the anal verge without mucosal sparing, remaining colon and terminal ileum appeared normal. Biopsies from the rectum revealed fulminant active chronic colitis consistent with IBD. Pathology from terminal ileum revealed intramucosal lymphoid aggregates. Remaining colon random biopsies with inactive chronic colitis  . COLONOSCOPY WITH PROPOFOL N/A 08/30/2013   XTK:WIOXBD mucosa in the terminal ileum/COLITIS/ MILD PROCTITIS. 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  Marland Kitchen COLONOSCOPY WITH PROPOFOL N/A 11/20/2015   Dr. 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 ESOPHAGOGASTRODUODENOSCOPY  11/20/2015  Dr. [name]: Normal exam, stomach biopsied and duodenal biopsy.. Benign biopsies.  . ESOPHAGOGASTRODUODENOSCOPY (EGD) WITH PROPOFOL N/A 12/07/2012   SLF:The mucosa of the esophagus appeared normal Non-erosive gastritis (inflammation) was found in the gastric antrum; multiple biopsies The duodenal mucosa showed no abnormalities in the bulb and  second portion of the duodenum  . ESOPHAGOGASTRODUODENOSCOPY (EGD) WITH PROPOFOL N/A 11/20/2015   Dr. [name]: normal with normal biopsies, negative H.pylori   . ESOPHAGOGASTRODUODENOSCOPY (EGD) WITH PROPOFOL N/A 04/18/2016   Procedure: ESOPHAGOGASTRODUODENOSCOPY (EGD) WITH PROPOFOL;  Surgeon: [name], MD;  Location[facility];  Service: Endoscopy;  Laterality: N/A;  . FLEXIBLE SIGMOIDOSCOPY N/A 04/18/2016   Procedure: FLEXIBLE SIGMOIDOSCOPY;  Surgeon: [name], MD;  Location: [facility];  Service: Endoscopy;  Laterality: N/A;  . TONSILLECTOMY         Home Medications    Prior to Admission medications   Medication Sig Start Date End Date Taking? Authorizing Provider  albuterol (PROVENTIL HFA;VENTOLIN HFA) 108 (90 Base) MCG/ACT inhaler Inhale 2 puffs into the lungs every 4 (four) hours as needed for wheezing or shortness of breath (and/or cough).   Yes [provider]  albuterol (PROVENTIL) (2.5 MG/3ML) 0.083% nebulizer solution Inhale 2.5 mg into the lungs every 6 (six) hours as needed for wheezing or shortness of breath.    Yes [provider]  atomoxetine (STRATTERA) 100 MG capsule Take 100 mg by mouth daily.    Yes [provider]  cholecalciferol (VITAMIN D) 1000 units tablet Take 1 tablet (1,000 Units total) by mouth daily. START AFTER VITAMIN D 50,000 U TABLETS ARE COMPLETE Patient taking differently: Take 1,000 Units by mouth daily.  04/08/16  Yes Fields, [name], MD  cloNIDine (CATAPRES) 0.1 MG tablet Take 0.1-0.2 mg by mouth at bedtime.  11/06/16  Yes [provider]  divalproex (DEPAKOTE) 500 MG DR tablet Take 1 tablet (500 mg total) by mouth 2 (two) times daily. 07/03/15  Yes [name], [name], MD  EPINEPHrine (EPIPEN 2-PAK) 0.3 mg/0.3 mL IJ SOAJ injection Inject 0.3 mg into the muscle once as needed (for severe allergic reaction).   Yes [provider]  loratadine (CLARITIN) 10 MG tablet Take 10 mg by mouth daily.    Yes [provider]  metoCLOPramide (REGLAN) 10 MG tablet Take 1 tablet (10 mg total) by mouth every 6 (six) hours as needed for nausea (nausea/headache). 12/23/16  Yes Daleen Bo, MD  montelukast (SINGULAIR) 10 MG tablet Take 1 tablet (10 mg total) by mouth at bedtime. 11/13/14  Yes [name], NP  oxyCODONE (ROXICODONE) 5 MG immediate release tablet Take 1 tablet (5 mg total) by mouth every 4 (four) hours as needed for severe pain. 12/23/16  Yes Daleen Bo, MD  sertraline (ZOLOFT) 100 MG tablet Take 1 tablet (100 mg total) by mouth daily. 11/13/14  Yes [name], NP  traZODone (DESYREL) 100 MG tablet Take 100 mg by mouth at bedtime.  09/10/16  Yes [provider]  ondansetron (ZOFRAN ODT) 4 MG disintegrating tablet Take 1 tablet (4 mg total) by mouth every 8 (eight) hours as needed for nausea. Patient not taking: Reported on 12/31/2016 12/13/16   [name], MD    Family History Family History  Problem Relation Age of Onset  . Asthma Mother   . Ulcers Mother   . Bipolar disorder Mother   . ADD / ADHD Father   . Diabetes Maternal Grandmother   . Diabetes Maternal Grandfather   .  Colon cancer Neg Hx   . Liver disease Neg Hx     Social History Social History  Substance Use Topics  . Smoking status: Former Smoker    Packs/day: 0.00    Years: 2.00    Quit date: 10/06/2012  . Smokeless tobacco: Never Used     Comment: Never smoked cigarettes  . Alcohol use No     Comment: denies usage     Allergies   Amoxicillin-pot clavulanate; Ibuprofen; Omeprazole; Pineapple; Strawberry extract; and Tomato   Review of Systems Review of Systems  Unable to perform ROS: Mental status change     Physical Exam Updated Vital Signs BP 107/60 (BP Location: Left Arm)   Pulse 78   Temp 98.6 F (37 C) (Oral)   Resp 16   Ht 5' 8"  (1.727 m)   Wt 104.6 kg (230 lb 9.6 oz)   SpO2 100%   BMI 35.06 kg/m   Physical Exam  Constitutional: He is oriented to person,  place, and time. He appears well-developed and well-nourished. No distress.  Patient is drowsy but arousable.  HENT:  Head: Normocephalic and atraumatic.  Mouth/Throat: Oropharynx is clear and moist. No oropharyngeal exudate.  Eyes: Pupils are equal, round, and reactive to light. EOM are normal. No scleral icterus.  Neck: Normal range of motion. Neck supple. No JVD present.  No meningismus  Cardiovascular: Normal rate and regular rhythm.  Exam reveals no gallop and no friction rub.   No murmur heard. Pulmonary/Chest: Effort normal and breath sounds normal. No respiratory distress. He has no wheezes. He has no rales. He exhibits no tenderness.  Abdominal: Soft. Bowel sounds are normal. He exhibits distension. There is tenderness. There is no rebound and no guarding.  Distended and diffusely tender to palpation. No rebound or guarding.  Musculoskeletal: Normal range of motion. He exhibits no edema or tenderness.  No CVA tenderness. No lower extremity swelling or asymmetry.  Neurological: He is alert and oriented to person, place, and time.  Person move all extremities without deficit. Sensation intact. Patient called back to sleep when not stimulated.  Skin: Skin is warm and dry. Capillary refill takes less than 2 seconds. No rash noted. No erythema.  Nursing note and vitals reviewed.    ED Treatments / Results  Labs (all labs ordered are listed, but only abnormal results are displayed) Labs Reviewed  CBC WITH DIFFERENTIAL/PLATELET - Abnormal; Notable for the following:       Result Value   Hemoglobin 12.4 (*)    HCT 36.0 (*)    RDW 20.6 (*)    All other components within normal limits  COMPREHENSIVE METABOLIC PANEL - Abnormal; Notable for the following:    Sodium 134 (*)    Glucose, Bld 100 (*)    BUN <5 (*)    Albumin 3.1 (*)    AST 507 (*)    ALT 331 (*)    Alkaline Phosphatase 154 (*)    Total Bilirubin 2.1 (*)    All other components within normal limits  PROTIME-INR -  Abnormal; Notable for the following:    Prothrombin Time 16.9 (*)    All other components within normal limits  APTT - Abnormal; Notable for the following:    aPTT 52 (*)    All other components within normal limits  ACETAMINOPHEN LEVEL - Abnormal; Notable for the following:    Acetaminophen (Tylenol), Serum <10 (*)    All other components within normal limits  AMMONIA - Abnormal; Notable for  the following:    Ammonia 79 (*)    All other components within normal limits  COMPREHENSIVE METABOLIC PANEL - Abnormal; Notable for the following:    Sodium 133 (*)    Glucose, Bld 103 (*)    Calcium 8.6 (*)    Albumin 3.0 (*)    AST 457 (*)    ALT 285 (*)    Total Bilirubin 1.9 (*)    All other components within normal limits  CBC - Abnormal; Notable for the following:    Hemoglobin 12.0 (*)    HCT 35.8 (*)    RDW 20.8 (*)    All other components within normal limits  PROTIME-INR - Abnormal; Notable for the following:    Prothrombin Time 16.6 (*)    All other components within normal limits  GLUCOSE, CAPILLARY - Abnormal; Notable for the following:    Glucose-Capillary 127 (*)    All other components within normal limits  LIPASE, BLOOD  GLUCOSE, CAPILLARY  GLUCOSE, CAPILLARY  HIV ANTIBODY (ROUTINE TESTING)  IGG, IGA, IGM  ANTI-MICROSOMAL ANTIBODY LIVER / KIDNEY  CBC WITH DIFFERENTIAL/PLATELET  COMPREHENSIVE METABOLIC PANEL  PROTIME-INR  AMMONIA    EKG  EKG Interpretation None       Radiology No results found.  Procedures Procedures (including critical care time)  Medications Ordered in ED Medications  0.9 %  sodium chloride infusion ( Intravenous New Bag/Given 01/01/17 1005)  atomoxetine (STRATTERA) capsule 100 mg (100 mg Oral Given 01/01/17 1644)  albuterol (PROVENTIL) (2.5 MG/3ML) 0.083% nebulizer solution 2.5 mg (not administered)  cloNIDine (CATAPRES) tablet 0.1-0.2 mg (0.1 mg Oral Given 12/31/16 2143)  divalproex (DEPAKOTE) DR tablet 500 mg (500 mg Oral Given  01/01/17 1005)  loratadine (CLARITIN) tablet 10 mg (10 mg Oral Given 01/01/17 1005)  metoCLOPramide (REGLAN) tablet 10 mg (not administered)  montelukast (SINGULAIR) tablet 10 mg (10 mg Oral Given 12/31/16 2143)  sertraline (ZOLOFT) tablet 100 mg (100 mg Oral Given 01/01/17 1005)  oxyCODONE (Oxy IR/ROXICODONE) immediate release tablet 5 mg (5 mg Oral Given 12/31/16 2307)  lactulose (CHRONULAC) 10 GM/15ML solution 20 g (not administered)  sodium chloride 0.9 % bolus 1,000 mL (1,000 mLs Intravenous New Bag/Given 12/31/16 1654)     Initial Impression / Assessment and Plan / ED Course  I have reviewed the triage vital signs and the nursing notes.  Pertinent labs & imaging results that were available during my care of the patient were reviewed by me and considered in my medical decision making (see chart for details).     Elevation in ammonia level. LFTs are similarly elevated as they were on prior laboratory testing. Discussed with Dr. [name]. Recommends lactulose, fluids and hospital admission. Will consult on patient.  Final Clinical Impressions(s) / ED Diagnoses   Final diagnoses:  Pain of upper abdomen  Elevated LFTs  Hyperammonemia [facility])    New Prescriptions Current Discharge Medication List       Julianne Rice, MD 01/01/17 [phone]

## 2016-12-31 NOTE — Telephone Encounter (Signed)
PT's mom called and asked if we had cancellations today. I told her we do not at this time.  She said pt is having severe right abdominal pain. I told her if it is severe he should go to ED. She said they never do anything but check his liver enzymes. She is concerned about results of his biopsy and also the fact that he has severe hepatitis and wants more information. I told her that Dr. [name] is at the hospital and I will let her know that she has called.

## 2017-01-01 DIAGNOSIS — F3130 Bipolar disorder, current episode depressed, mild or moderate severity, unspecified: Secondary | ICD-10-CM | POA: Diagnosis not present

## 2017-01-01 DIAGNOSIS — D649 Anemia, unspecified: Secondary | ICD-10-CM | POA: Diagnosis not present

## 2017-01-01 DIAGNOSIS — E7220 Disorder of urea cycle metabolism, unspecified: Secondary | ICD-10-CM | POA: Diagnosis not present

## 2017-01-01 DIAGNOSIS — K51811 Other ulcerative colitis with rectal bleeding: Secondary | ICD-10-CM | POA: Diagnosis not present

## 2017-01-01 DIAGNOSIS — R4182 Altered mental status, unspecified: Secondary | ICD-10-CM | POA: Diagnosis not present

## 2017-01-01 DIAGNOSIS — R7989 Other specified abnormal findings of blood chemistry: Secondary | ICD-10-CM | POA: Diagnosis not present

## 2017-01-01 DIAGNOSIS — R945 Abnormal results of liver function studies: Secondary | ICD-10-CM

## 2017-01-01 DIAGNOSIS — K754 Autoimmune hepatitis: Secondary | ICD-10-CM | POA: Diagnosis not present

## 2017-01-01 LAB — COMPREHENSIVE METABOLIC PANEL
ALT: 285 U/L — ABNORMAL HIGH (ref 17–63)
AST: 457 U/L — ABNORMAL HIGH (ref 15–41)
Albumin: 3 g/dL — ABNORMAL LOW (ref 3.5–5.0)
Alkaline Phosphatase: 125 U/L (ref 38–126)
Anion gap: 6 (ref 5–15)
BUN: 6 mg/dL (ref 6–20)
CO2: 25 mmol/L (ref 22–32)
Calcium: 8.6 mg/dL — ABNORMAL LOW (ref 8.9–10.3)
Chloride: 102 mmol/L (ref 101–111)
Creatinine, Ser: 0.8 mg/dL (ref 0.61–1.24)
GFR calc Af Amer: >60 mL/min (ref >60)
GFR calc non Af Amer: >60 mL/min (ref >60)
Glucose, Bld: 103 mg/dL — ABNORMAL HIGH (ref 65–99)
Potassium: 4.2 mmol/L (ref 3.5–5.1)
Sodium: 133 mmol/L — ABNORMAL LOW (ref 135–145)
Total Bilirubin: 1.9 mg/dL — ABNORMAL HIGH (ref 0.3–1.2)
Total Protein: 7.2 g/dL (ref 6.5–8.1)

## 2017-01-01 LAB — CBC
HCT: 35.8 % — ABNORMAL LOW (ref 39.0–52.0)
Hemoglobin: 12 g/dL — ABNORMAL LOW (ref 13.0–17.0)
MCH: 27.1 pg (ref 26.0–34.0)
MCHC: 33.5 g/dL (ref 30.0–36.0)
MCV: 80.8 fL (ref 78.0–100.0)
Platelets: 215 10*3/uL (ref 150–400)
RBC: 4.43 MIL/uL (ref 4.22–5.81)
RDW: 20.8 % — ABNORMAL HIGH (ref 11.5–15.5)
WBC: 8.4 10*3/uL (ref 4.0–10.5)

## 2017-01-01 LAB — GLUCOSE, CAPILLARY
Glucose-Capillary: 105 mg/dL — ABNORMAL HIGH (ref 65–99)
Glucose-Capillary: 127 mg/dL — ABNORMAL HIGH (ref 65–99)
Glucose-Capillary: 80 mg/dL (ref 65–99)
Glucose-Capillary: 88 mg/dL (ref 65–99)

## 2017-01-01 LAB — PROTIME-INR
INR: 1.33
Prothrombin Time: 16.6 seconds — ABNORMAL HIGH (ref 11.4–15.2)

## 2017-01-01 MED ORDER — LACTULOSE 10 GM/15ML PO SOLN
20.0000 g | Freq: Every day | ORAL | Status: DC
  Administered 2017-01-02: 20 g via ORAL
  Filled 2017-01-01: qty 30

## 2017-01-01 NOTE — Progress Notes (Signed)
PROGRESS NOTE    Wayne Avila  XBL:[id number] DOB: June 24, 1990 DOA: 12/31/2016 PCP: Patient, No Pcp Per    Brief Narrative:  26 year old male with a history of bipolar 1 disorder, C. difficile, GERD, brought to the emergency room with complaints of left shoulder pain and drowsiness. He isn't had elevated ammonia level and liver function tests. He was admitted for further treatments.   Assessment & Plan:   Principal Problem:   Altered mental status Active Problems:   Bipolar I disorder, most recent episode depressed ([facility])   Asthma   Anemia   Hyperammonemia ([facility])   Abnormal LFTs   1. Altered mental status, possibly hepatic encephalopathy in the setting of elevated ammonia. Continue on IV fluids. Continue lactulose. Limit narcotics. Recheck ammonia in a.m . overall mental status improving. 2. Abnormal liver enzymes. GI following. He is status post liver biopsy with results pending. 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 Recheck in a.m . 3. Bipolar 1 disorder. Continue on Depakote, sertraline and Strattera 4. Asthma. Continue albuterol as needed.   DVT prophylaxis: SCDs Code Status: Full code Family Communication: Discussed with mother at bedside Disposition Plan: Discharge home once improved, likely in a.m.   Consultants:   Gastroenterology  Procedures:     Antimicrobials:       Subjective: Feeling better today. Having loose stools since receiving lactulose  Objective: Vitals:   12/31/16 1937 12/31/16 2219 01/01/17 0529 01/01/17 1400  BP:  135/87 129/82 107/60  Pulse:  (!) 112 (!) 103 78  Resp:  20 14 16   Temp:  98.8 F (37.1 C) (!) 97.4 F (36.3 C) 98.6 F (37 C)  TempSrc:  Oral  Oral  SpO2: 99% 100% 100% 100%  Weight:      Height:        Intake/Output Summary (Last 24 hours) at 01/01/17 1713 Last data filed at 01/01/17 1300  Gross per 24 hour  Intake              480 ml  Output                0 ml  Net              480 ml    Filed Weights   12/31/16 1404 12/31/16 1828  Weight: 104.8 kg (231 lb) 104.6 kg (230 lb 9.6 oz)    Examination:  General exam: Appears calm and comfortable  Respiratory system: Clear to auscultation. Respiratory effort normal. Cardiovascular system: S1 & S2 heard, RRR. No JVD, murmurs, rubs, gallops or clicks. No pedal edema. Gastrointestinal system: Abdomen is nondistended, soft and nontender. No organomegaly or masses felt. Normal bowel sounds heard. Central nervous system: Alert and oriented. No focal neurological deficits. Extremities: Symmetric 5 x 5 power. Skin: No rashes, lesions or ulcers Psychiatry: Judgement and insight appear normal. Mood & affect appropriate.     Data Reviewed: I have personally reviewed following labs and imaging studies  CBC:  Recent Labs Lab 12/30/16 0224 12/31/16 1458 01/01/17 0600  WBC 7.3 7.9 8.4  NEUTROABS 3.8 4.1  --   HGB 12.8* 12.4* 12.0*  HCT 38.3* 36.0* 35.8*  MCV 81.0 80.0 80.8  PLT 191 189 300   Basic Metabolic Panel:  Recent Labs Lab 12/30/16 0224 12/31/16 1458 01/01/17 0600  NA 137 134* 133*  K 4.5 4.3 4.2  CL 103 104 102  CO2 27 24 25   GLUCOSE 89 100* 103*  BUN 5* <5* 6  CREATININE 0.91 0.80  0.80  CALCIUM 9.3 9.3 8.6*   GFR: Estimated Creatinine Clearance: 167 mL/min (by C-G formula based on SCr of 0.8 mg/dL). Liver Function Tests:  Recent Labs Lab 12/30/16 0224 12/31/16 1458 01/01/17 0600  AST 608* 507* 457*  ALT 377* 331* 285*  ALKPHOS 139* 154* 125  BILITOT 2.1* 2.1* 1.9*  PROT 7.8 7.6 7.2  ALBUMIN 3.2* 3.1* 3.0*    Recent Labs Lab 12/30/16 0224 12/31/16 1458  LIPASE 33 27    Recent Labs Lab 12/31/16 1503  AMMONIA 79*   Coagulation Profile:  Recent Labs Lab 12/31/16 1458 01/01/17 0600  INR 1.36 1.33   Cardiac Enzymes: No results for input(s): CKTOTAL, CKMB, CKMBINDEX, TROPONINI in the last 168 hours. BNP (last 3 results) No results for input(s): PROBNP in the last 8760  hours. HbA1C: No results for input(s): HGBA1C in the last 72 hours. CBG:  Recent Labs Lab 01/01/17 0028 01/01/17 0721 01/01/17 1123  GLUCAP 127* 80 88   Lipid Profile: No results for input(s): CHOL, HDL, LDLCALC, TRIG, CHOLHDL, LDLDIRECT in the last 72 hours. Thyroid Function Tests: No results for input(s): TSH, T4TOTAL, FREET4, T3FREE, THYROIDAB in the last 72 hours. Anemia Panel: No results for input(s): VITAMINB12, FOLATE, FERRITIN, TIBC, IRON, RETICCTPCT in the last 72 hours. Sepsis Labs: No results for input(s): PROCALCITON, LATICACIDVEN in the last 168 hours.  Recent Results (from the past 240 hour(s))  Blood Culture (routine x 2)     Status: None   Collection Time: 12/23/16  3:20 PM  Result Value Ref Range Status   Specimen Description RIGHT ANTECUBITAL  Final   Special Requests   Final    BOTTLES DRAWN AEROBIC AND ANAEROBIC Blood Culture results may not be optimal due to an inadequate volume of blood received in culture bottles   Culture NO GROWTH 5 DAYS  Final   Report Status 12/28/2016 FINAL  Final  Urine culture     Status: None   Collection Time: 12/23/16  3:20 PM  Result Value Ref Range Status   Specimen Description URINE, RANDOM  Final   Special Requests NONE  Final   Culture   Final    NO GROWTH Performed at [facility], [place] [address] [place], [place] [address]    Report Status 12/25/2016 FINAL  Final  Blood Culture (routine x 2)     Status: None   Collection Time: 12/23/16  3:25 PM  Result Value Ref Range Status   Specimen Description BLOOD RIGHT HAND  Final   Special Requests   Final    BOTTLES DRAWN AEROBIC AND ANAEROBIC Blood Culture results may not be optimal due to an inadequate volume of blood received in culture bottles   Culture NO GROWTH 5 DAYS  Final   Report Status 12/28/2016 FINAL  Final         Radiology Studies: No results found.      Scheduled Meds: . atomoxetine  100 mg Oral Daily  . cloNIDine  0.1-0.2 mg Oral QHS   . divalproex  500 mg Oral BID  . [START ON 01/02/2017] lactulose  20 g Oral Q breakfast  . loratadine  10 mg Oral Daily  . montelukast  10 mg Oral QHS  . sertraline  100 mg Oral Daily   Continuous Infusions: . sodium chloride 100 mL/hr at 01/01/17 1005     LOS: 0 days    Time spent: 84mns    MEMON,JEHANZEB, MD [facility] Pager [phone] If 7PM-7AM, please contact night-coverage  [email or web address] Password [facility] 01/01/2017, 5:13 PM

## 2017-01-01 NOTE — Consult Note (Signed)
Referring Provider: [facility] Primary Care Physician:  Patient, No Pcp Per Primary Gastroenterologist:  Dr. [name]  Date of Admission: 12/31/16 Date of Consultation: 01/01/17  Reason for Consultation:  Elevated LFTs, drowsiness  HPI:  Wayne Avila is a 26 y.o. male with a past medical history of ADHD, anxiety, mental developmental delay, bipolar 1, depression, asthma, CDiff colitis, GERD, impaired hearing, seizures, and hepatic steatosis who presented to the ER with his mother with complaints of shoulder pain, recurrent abdominal pain, and drowsiness. In the ED patient was arousable but drowsy, vitals normal. Initial labs showed persistent elevated LFTs: AST/ALT 608/377, alkaline phosphatase 139, bilirubin 2.1. Yesterday this improved somewhat to AST/ALT 507/331, alkaline phosphatase increased to 154, bilirubin stable at 2.1. His INR was 1.36. Lipase normal. Acetaminophen less than 10. Ammonia was elevated at 79.  Today he so far he has received 1 dose of lactulose last night. His labs drawn this morning show continued improvement of liver functions with AST/ALT 457/285, alkaline phosphatase normal at 125, bilirubin decreased to 1.9. His albumin is low at 3.0. An sodium 133. INR stable/decreased to 1.33 from 1.36 yesterday. CBC shows mild stable anemia at 12.0 which declined from 12.4 yesterday. No leukocytosis.  Today he states he is feeling well. Denies abdominal pain, nausea, vomiting, hematochezia, melena. No ongoing GI complaints at this time. When I entered his room he was sitting up in the bed taxing on his cell phone. He is accompanied by his mother who is resting in the chair at his bedside. No chest pain, significant shortness of breath, dizziness, lightheadedness, syncope, near syncope.  Past Medical History:  Diagnosis Date  . ADHD (attention deficit hyperactivity disorder)   . Anxiety   . Asthma   . Bipolar 1 disorder ([place])   . Clostridium difficile colitis   .  Depression   . GERD (gastroesophageal reflux disease)   . HOH (hard of hearing)   . Mental developmental delay   . Seizures ([place])     Past Surgical History:  Procedure Laterality Date  . ADENOIDECTOMY    . BIOPSY N/A 12/07/2012   Procedure: GASTRIC BIOPSIES;  Surgeon: [name], MD;  Location: [facility];  Service: Endoscopy;  Laterality: N/A;  . BIOPSY N/A 08/30/2013   Procedure: BIOPSY;  Surgeon: [name], MD;  Location: [facility];  Service: Endoscopy;  Laterality: N/A;  right colon,transverse colon, left colon, rectal biopsies  . BIOPSY  11/20/2015   Procedure: BIOPSY;  Surgeon: [name], MD;  Location: [facility];  Service: Endoscopy;;  ileal, right colon biopsy, left colon, rectum  . BIOPSY  04/18/2016   Procedure: BIOPSY;  Surgeon: [name], MD;  Location: [facility];  Service: Endoscopy;;  left descending colon biopsies  . COLONOSCOPY  11/20/2015   Dr. [name]: Severe erythema, edema, ulcers from the anal verge to 20 cm above the anal verge without mucosal sparing, remaining colon and terminal ileum appeared normal. Biopsies from the rectum revealed fulminant active chronic colitis consistent with IBD. Pathology from terminal ileum revealed intramucosal lymphoid aggregates. Remaining colon random biopsies with inactive chronic colitis  . COLONOSCOPY WITH PROPOFOL N/A 08/30/2013   HAL:PFXTKW mucosa in the terminal ileum/COLITIS/ MILD PROCTITIS. 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  Marland Kitchen COLONOSCOPY WITH PROPOFOL N/A 11/20/2015   Dr. 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 ESOPHAGOGASTRODUODENOSCOPY  11/20/2015  Dr. [name]: Normal exam, stomach biopsied and duodenal biopsy.. Benign biopsies.  . ESOPHAGOGASTRODUODENOSCOPY (EGD) WITH PROPOFOL N/A 12/07/2012   SLF:The mucosa of the esophagus appeared normal Non-erosive  gastritis (inflammation) was found in the gastric antrum; multiple biopsies The duodenal mucosa showed no abnormalities in the bulb and second portion of the duodenum  . ESOPHAGOGASTRODUODENOSCOPY (EGD) WITH PROPOFOL N/A 11/20/2015   Dr. [name]: normal with normal biopsies, negative H.pylori   . ESOPHAGOGASTRODUODENOSCOPY (EGD) WITH PROPOFOL N/A 04/18/2016   Procedure: ESOPHAGOGASTRODUODENOSCOPY (EGD) WITH PROPOFOL;  Surgeon: [name], MD;  [facility];  Service: Endoscopy;  Laterality: N/A;  . FLEXIBLE SIGMOIDOSCOPY N/A 04/18/2016   Procedure: FLEXIBLE SIGMOIDOSCOPY;  Surgeon: [name], MD;  Location: [facility];  Service: Endoscopy;  Laterality: N/A;  . TONSILLECTOMY      Prior to Admission medications   Medication Sig Start Date End Date Taking? Authorizing Provider  albuterol (PROVENTIL HFA;VENTOLIN HFA) 108 (90 Base) MCG/ACT inhaler Inhale 2 puffs into the lungs every 4 (four) hours as needed for wheezing or shortness of breath (and/or cough).   Yes [provider]  albuterol (PROVENTIL) (2.5 MG/3ML) 0.083% nebulizer solution Inhale 2.5 mg into the lungs every 6 (six) hours as needed for wheezing or shortness of breath.    Yes [provider]  atomoxetine (STRATTERA) 100 MG capsule Take 100 mg by mouth daily.    Yes [provider]  cholecalciferol (VITAMIN D) 1000 units tablet Take 1 tablet (1,000 Units total) by mouth daily. START AFTER VITAMIN D 50,000 U TABLETS ARE COMPLETE Patient taking differently: Take 1,000 Units by mouth daily.  04/08/16  Yes Fields, [name], MD  cloNIDine (CATAPRES) 0.1 MG tablet Take 0.1-0.2 mg by mouth at bedtime.  11/06/16  Yes [provider]  divalproex (DEPAKOTE) 500 MG DR tablet Take 1 tablet (500 mg total) by mouth 2 (two) times daily. 07/03/15  Yes [name], [name], MD  EPINEPHrine (EPIPEN 2-PAK) 0.3 mg/0.3 mL IJ SOAJ injection Inject 0.3 mg into the muscle once as needed (for severe allergic  reaction).   Yes [provider]  loratadine (CLARITIN) 10 MG tablet Take 10 mg by mouth daily.   Yes [provider]  metoCLOPramide (REGLAN) 10 MG tablet Take 1 tablet (10 mg total) by mouth every 6 (six) hours as needed for nausea (nausea/headache). 12/23/16  Yes Daleen Bo, MD  montelukast (SINGULAIR) 10 MG tablet Take 1 tablet (10 mg total) by mouth at bedtime. 11/13/14  Yes [name], NP  oxyCODONE (ROXICODONE) 5 MG immediate release tablet Take 1 tablet (5 mg total) by mouth every 4 (four) hours as needed for severe pain. 12/23/16  Yes Daleen Bo, MD  sertraline (ZOLOFT) 100 MG tablet Take 1 tablet (100 mg total) by mouth daily. 11/13/14  Yes [name], NP  traZODone (DESYREL) 100 MG tablet Take 100 mg by mouth at bedtime.  09/10/16  Yes [provider]  ondansetron (ZOFRAN ODT) 4 MG disintegrating tablet Take 1 tablet (4 mg total) by mouth every 8 (eight) hours as needed for nausea. Patient not taking: Reported on 12/31/2016 12/13/16   [name], MD    Current Facility-Administered Medications  Medication Dose Route Frequency Provider Last Rate Last Dose  . 0.9 %  sodium chloride infusion   Intravenous Continuous Reubin Milan, MD 100 mL/hr at 12/31/16 1959 100 mL/hr at 12/31/16 1959  . albuterol (PROVENTIL) (2.5 MG/3ML) 0.083% nebulizer solution 2.5 mg  2.5 mg Inhalation Q6H  PRN Reubin Milan, MD      . atomoxetine [facility]) capsule 100 mg  100 mg Oral Daily Reubin Milan, MD      . cloNIDine (CATAPRES) tablet 0.1-0.2 mg  0.1-0.2 mg Oral QHS Reubin Milan, MD   0.1 mg at 12/31/16 2143  . divalproex (DEPAKOTE) DR tablet 500 mg  500 mg Oral BID Reubin Milan, MD   500 mg at 12/31/16 2143  . lactulose (CHRONULAC) 10 GM/15ML solution 20 g  20 g Oral TID Reubin Milan, MD   20 g at 12/31/16 2022  . loratadine (CLARITIN) tablet 10 mg  10 mg Oral Daily Reubin Milan, MD   10 mg at 12/31/16 2021  . metoCLOPramide  (REGLAN) tablet 10 mg  10 mg Oral Q6H PRN Reubin Milan, MD      . montelukast [facility]) tablet 10 mg  10 mg Oral QHS Reubin Milan, MD   10 mg at 12/31/16 2143  . oxyCODONE (Oxy IR/ROXICODONE) immediate release tablet 5 mg  5 mg Oral Q6H PRN Reubin Milan, MD   5 mg at 12/31/16 2307  . sertraline (ZOLOFT) tablet 100 mg  100 mg Oral Daily Reubin Milan, MD   100 mg at 12/31/16 2021    Allergies as of 12/31/2016 - Review Complete 12/31/2016  Allergen Reaction Noted  . Amoxicillin-pot clavulanate Nausea And Vomiting and Other (See Comments) 02/28/2011  . Ibuprofen Other (See Comments) 07/03/2015  . Omeprazole Nausea And Vomiting 04/20/2013  . Pineapple Swelling and Other (See Comments) 04/14/2011  . Strawberry extract Swelling and Other (See Comments) 05/08/2012  . Tomato Rash 04/14/2011    Family History  Problem Relation Age of Onset  . Asthma Mother   . Ulcers Mother   . Bipolar disorder Mother   . ADD / ADHD Father   . Diabetes Maternal Grandmother   . Diabetes Maternal Grandfather   . Colon cancer Neg Hx   . Liver disease Neg Hx     Social History   Social History  . Marital status: Single    Spouse name: N/A  . Number of children: 0  . Years of education: 12th   Occupational History  .  Not Employed    not working   Social History Main Topics  . Smoking status: Former Smoker    Packs/day: 0.00    Years: 2.00    Quit date: 10/06/2012  . Smokeless tobacco: Never Used     Comment: Never smoked cigarettes  . Alcohol use No     Comment: denies usage  . Drug use: No  . Sexual activity: Yes   Other Topics Concern  . Not on file   Social History Narrative   Patient lives at home with girlfriend and family.   Caffeine Use: occasionally    Review of Systems: Complete ROS negative except as per HPI.  Physical Exam: Vital signs in last 24 hours: Temp:  [97.4 F (36.3 C)-98.8 F (37.1 C)] 97.4 F (36.3 C) (07/26 0529) Pulse Rate:   [71-112] 103 (07/26 0529) Resp:  [14-20] 14 (07/26 0529) BP: (124-141)/(78-94) 129/82 (07/26 0529) SpO2:  [97 %-100 %] 100 % (07/26 0529) Weight:  [230 lb 9.6 oz (104.6 kg)-231 lb (104.8 kg)] 230 lb 9.6 oz (104.6 kg) (07/25 1828) Last BM Date: 01/01/17 General:   Alert,  Well-developed, well-nourished, pleasant and cooperative in NAD Head:  Normocephalic and atraumatic. Eyes:  Sclera clear, no icterus. Conjunctiva pink. Ears:  Normal  auditory acuity. Neck:  Supple; no masses or thyromegaly. Lungs:  Clear throughout to auscultation.   No wheezes, crackles, or rhonchi. No acute distress. Heart:  Regular rate and rhythm; no murmurs, clicks, rubs,  or gallops. Abdomen:  Obese but soft, non-tender, and nondistended. No masses, hepatosplenomegaly or hernias noted. Normal bowel sounds, without guarding, and without rebound.   Rectal:  Deferred.   Msk:  Symmetrical without gross deformities. Pulses:  Normal bilateral DP pulses noted. Extremities:  Without clubbing or edema. Neurologic:  Alert and  oriented x4; grossly normal neurologically. Psych:  Alert and cooperative. Normal mood and affect.  Intake/Output from previous day: No intake/output data recorded. Intake/Output this shift: No intake/output data recorded.  Lab Results:  Recent Labs  12/30/16 0224 12/31/16 1458 01/01/17 0600  WBC 7.3 7.9 8.4  HGB 12.8* 12.4* 12.0*  HCT 38.3* 36.0* 35.8*  PLT 191 189 215   BMET  Recent Labs  12/30/16 0224 12/31/16 1458 01/01/17 0600  NA 137 134* 133*  K 4.5 4.3 4.2  CL 103 104 102  CO2 27 24 25   GLUCOSE 89 100* 103*  BUN 5* <5* 6  CREATININE 0.91 0.80 0.80  CALCIUM 9.3 9.3 8.6*   LFT  Recent Labs  12/30/16 0224 12/31/16 1458 01/01/17 0600  PROT 7.8 7.6 7.2  ALBUMIN 3.2* 3.1* 3.0*  AST 608* 507* 457*  ALT 377* 331* 285*  ALKPHOS 139* 154* 125  BILITOT 2.1* 2.1* 1.9*   PT/INR  Recent Labs  12/31/16 1458 01/01/17 0600  LABPROT 16.9* 16.6*  INR 1.36 1.33    Hepatitis Panel No results for input(s): HEPBSAG, HCVAB, HEPAIGM, HEPBIGM in the last 72 hours. C-Diff No results for input(s): CDIFFTOX in the last 72 hours.  Studies/Results: No results found.  Impression: 26 year old male well-known to our service with a history of hepatic steatosis. He has recently been evaluated for a significant increase in liver function tests. He was admitted yesterday for abdominal pain as well as shoulder pain. He has persistent elevated LFTs. He is status post liver biopsy and we are still awaiting pathology. Initial pathology found significant hepatitis and this sample has been sent off for further stains to try to discern an etiology. Possibilities include autoimmune, cryptogenic, drug reaction. He has been on Remicade and this is a possible culprit.  His LFTs of admission included AST/ALT of 507/331, alkaline phosphatase at 154, total bilirubin 2.1, INR at 1.36, platelet count at 189. Today his labs improved AST/ALT of 457/285, alkaline phosphatase now normal at 125, bilirubin declined to 1.9. Sodium stable at 133 (was 134 yesterday), INR today declined to 1.33 and platelet count up to 215. He is generally asymptomatic today. No signs of liver failure. Sclera clear, no abdominal or lower extremity edema. His initial confusion, which could be his normal delay development compounded by medications for bipolar disorder, has improved. His ammonia was initially elevated at 79, although venous ammonia is not very reliable. He is awake and conversational today answering questions appropriately. He has received only 1 dose of lactulose.  Clinically he is improved today with improving liver function testing in generally asymptomatic state. We will continue to follow for pathology results.  Plan: 1. Continue supportive treatment 2. Recheck liver function tests tomorrow including CMP, CBC, INR. 3. Continue lactulose: titrate for 2-3 soft bowel movements a day 4. If his  liver function tests and continues to improve he would likely be appropriate for discharge from a GI standpoint tomorrow   Thank you for  allowing [place] to participate in the care of Sanjuana Kava, DNP, AGNP-C Adult & Gerontological Nurse Practitioner [facility]    LOS: 0 days     01/01/2017, 8:45 AM

## 2017-01-02 ENCOUNTER — Telehealth: Payer: Self-pay | Admitting: Gastroenterology

## 2017-01-02 ENCOUNTER — Encounter (HOSPITAL_COMMUNITY): Payer: Self-pay

## 2017-01-02 DIAGNOSIS — K51811 Other ulcerative colitis with rectal bleeding: Secondary | ICD-10-CM

## 2017-01-02 DIAGNOSIS — R7989 Other specified abnormal findings of blood chemistry: Secondary | ICD-10-CM | POA: Diagnosis not present

## 2017-01-02 DIAGNOSIS — R945 Abnormal results of liver function studies: Secondary | ICD-10-CM | POA: Diagnosis not present

## 2017-01-02 DIAGNOSIS — D649 Anemia, unspecified: Secondary | ICD-10-CM | POA: Diagnosis not present

## 2017-01-02 DIAGNOSIS — R1010 Upper abdominal pain, unspecified: Secondary | ICD-10-CM | POA: Diagnosis not present

## 2017-01-02 DIAGNOSIS — K754 Autoimmune hepatitis: Secondary | ICD-10-CM | POA: Diagnosis present

## 2017-01-02 DIAGNOSIS — E7220 Disorder of urea cycle metabolism, unspecified: Secondary | ICD-10-CM | POA: Diagnosis not present

## 2017-01-02 DIAGNOSIS — F3130 Bipolar disorder, current episode depressed, mild or moderate severity, unspecified: Secondary | ICD-10-CM | POA: Diagnosis not present

## 2017-01-02 DIAGNOSIS — R4182 Altered mental status, unspecified: Secondary | ICD-10-CM | POA: Diagnosis not present

## 2017-01-02 HISTORY — DX: Autoimmune hepatitis: K75.4

## 2017-01-02 LAB — CBC WITH DIFFERENTIAL/PLATELET
Basophils Absolute: 0 10*3/uL (ref 0.0–0.1)
Basophils Relative: 0 %
Eosinophils Absolute: 0 10*3/uL (ref 0.0–0.7)
Eosinophils Relative: 1 %
HCT: 35.6 % — ABNORMAL LOW (ref 39.0–52.0)
Hemoglobin: 11.9 g/dL — ABNORMAL LOW (ref 13.0–17.0)
Lymphocytes Relative: 35 %
Lymphs Abs: 2.7 10*3/uL (ref 0.7–4.0)
MCH: 27 pg (ref 26.0–34.0)
MCHC: 33.4 g/dL (ref 30.0–36.0)
MCV: 80.9 fL (ref 78.0–100.0)
Monocytes Absolute: 0.6 10*3/uL (ref 0.1–1.0)
Monocytes Relative: 7 %
Neutro Abs: 4.3 10*3/uL (ref 1.7–7.7)
Neutrophils Relative %: 57 %
Platelets: 205 10*3/uL (ref 150–400)
RBC: 4.4 MIL/uL (ref 4.22–5.81)
RDW: 21.2 % — ABNORMAL HIGH (ref 11.5–15.5)
WBC: 7.6 10*3/uL (ref 4.0–10.5)

## 2017-01-02 LAB — GLUCOSE, CAPILLARY
Glucose-Capillary: 132 mg/dL — ABNORMAL HIGH (ref 65–99)
Glucose-Capillary: 114 mg/dL — ABNORMAL HIGH (ref 65–99)
Glucose-Capillary: 110 mg/dL — ABNORMAL HIGH (ref 65–99)

## 2017-01-02 LAB — PROTIME-INR
INR: 1.39
Prothrombin Time: 17.2 seconds — ABNORMAL HIGH (ref 11.4–15.2)

## 2017-01-02 LAB — AMMONIA: Ammonia: 54 umol/L — ABNORMAL HIGH (ref 9–35)

## 2017-01-02 LAB — COMPREHENSIVE METABOLIC PANEL
ALT: 265 U/L — ABNORMAL HIGH (ref 17–63)
AST: 483 U/L — ABNORMAL HIGH (ref 15–41)
Albumin: 3 g/dL — ABNORMAL LOW (ref 3.5–5.0)
Alkaline Phosphatase: 133 U/L — ABNORMAL HIGH (ref 38–126)
Anion gap: 8 (ref 5–15)
BUN: 5 mg/dL — ABNORMAL LOW (ref 6–20)
CO2: 24 mmol/L (ref 22–32)
Calcium: 8.7 mg/dL — ABNORMAL LOW (ref 8.9–10.3)
Chloride: 104 mmol/L (ref 101–111)
Creatinine, Ser: 0.81 mg/dL (ref 0.61–1.24)
GFR calc Af Amer: >60 mL/min (ref >60)
GFR calc non Af Amer: >60 mL/min (ref >60)
Glucose, Bld: 118 mg/dL — ABNORMAL HIGH (ref 65–99)
Potassium: 3.5 mmol/L (ref 3.5–5.1)
Sodium: 136 mmol/L (ref 135–145)
Total Bilirubin: 1.9 mg/dL — ABNORMAL HIGH (ref 0.3–1.2)
Total Protein: 7.1 g/dL (ref 6.5–8.1)

## 2017-01-02 LAB — IGG, IGA, IGM
IgA: 361 mg/dL (ref 90–386)
IgG (Immunoglobin G), Serum: 2160 mg/dL — ABNORMAL HIGH (ref 700–1600)
IgM, Serum: 328 mg/dL — ABNORMAL HIGH (ref 20–172)

## 2017-01-02 LAB — ANTI-MICROSOMAL ANTIBODY LIVER / KIDNEY: LKM1 Ab: 3.2 Units (ref 0.0–20.0)

## 2017-01-02 LAB — HIV ANTIBODY (ROUTINE TESTING W REFLEX): HIV Screen 4th Generation wRfx: NONREACTIVE

## 2017-01-02 MED ORDER — LIDOCAINE VISCOUS 2 % MT SOLN: OROMUCOSAL | 5 refills | Status: DC

## 2017-01-02 MED ORDER — LACTULOSE 10 GM/15ML PO SOLN: 20.0000 g | Freq: Every day | ORAL | 0 refills | Status: DC | PRN

## 2017-01-02 MED ORDER — PREDNISONE 10 MG PO TABS: ORAL_TABLET | ORAL | 1 refills | Status: DC

## 2017-01-02 NOTE — Progress Notes (Signed)
    Subjective: Per patient mom his mental status is much improved. Patient states he feels better, his mind feels clear. Denies abdominal pain, nausea, vomiting, hematochezia, melena. No further GI symptoms.  Objective: Vital signs in last 24 hours: Temp:  [98.5 F (36.9 C)-98.7 F (37.1 C)] 98.5 F (36.9 C) (07/27 2585) Pulse Rate:  [78-106] 106 (07/27 0633) Resp:  [16] 16 (07/26 1400) BP: (107-146)/(60-91) 135/75 (07/27 0633) SpO2:  [96 %-100 %] 100 % (07/27 2778) Last BM Date: 01/01/17 General:   Alert and oriented, pleasant Eyes:  No icterus, sclera clear. Conjuctiva pink.  Heart:  S1, S2 present, no murmurs noted.  Lungs: Clear to auscultation bilaterally, without wheezing, rales, or rhonchi.  Abdomen:  Bowel sounds present, obese but soft, non-tender, non-distended. No HSM or hernias noted. No rebound or guarding. No masses appreciated  Extremities:  Without clubbing or edema. Neurologic:  Alert and  oriented x4;  grossly normal neurologically. Skin:  Warm and dry, intact without significant lesions.  Psych:  Alert and cooperative. Normal mood and affect.  Intake/Output from previous day: 07/26 0701 - 07/27 0700 In: 720 [P.O.:720] Out: -  Intake/Output this shift: No intake/output data recorded.  Lab Results:  Recent Labs  12/31/16 1458 01/01/17 0600 01/02/17 0549  WBC 7.9 8.4 7.6  HGB 12.4* 12.0* 11.9*  HCT 36.0* 35.8* 35.6*  PLT 189 215 205   BMET  Recent Labs  12/31/16 1458 01/01/17 0600 01/02/17 0549  NA 134* 133* 136  K 4.3 4.2 3.5  CL 104 102 104  CO2 24 25 24   GLUCOSE 100* 103* 118*  BUN <5* 6 5*  CREATININE 0.80 0.80 0.81  CALCIUM 9.3 8.6* 8.7*   LFT  Recent Labs  12/31/16 1458 01/01/17 0600 01/02/17 0549  PROT 7.6 7.2 7.1  ALBUMIN 3.1* 3.0* 3.0*  AST 507* 457* 483*  ALT 331* 285* 265*  ALKPHOS 154* 125 133*  BILITOT 2.1* 1.9* 1.9*   PT/INR  Recent Labs  01/01/17 0600 01/02/17 0549  LABPROT 16.6* 17.2*  INR 1.33 1.39    Hepatitis Panel No results for input(s): HEPBSAG, HCVAB, HEPAIGM, HEPBIGM in the last 72 hours.   Studies/Results: No results found.  Assessment: The patient continues to clinically improve. His labs have improved today from a liver standpoint. 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 There are plans to discuss the case today with [facility] hepatology. The patient should not receive further Remicade. The discussion is artery been broached with him and his family about whether they would prefer to trial another biologic agent versus refer to tertiary care center for colon resection. He will need close outpatient follow-up to trend his labs. Further recommendations per discussion with [facility] hepatology.  Plan: 1. Supportive measures. 2. Further recommendations per discussion with [facility] hepatology. 3. He is likely appropriate for discharge today from a GI standpoint on pain medications as per yesterday's consult note.   Thank you for allowing [place] to participate in the care of Wayne Kava, DNP, AGNP-C Adult & Gerontological Nurse Practitioner [facility]     LOS: 0 days    01/02/2017, 10:05 AM

## 2017-01-02 NOTE — Telephone Encounter (Signed)
PT NEEDS TOTAL COLECTOMY WITH IPAA. WILL REFER TO [facility] GENERAL SURGERY, Dx: ULCERATIVE COLITIS, FAILED REMICADE, BUDESONIDE, MESALAMINE.   CANNOT TOLERTAE PPIs. ON ZANTAC. USE LIDOCAINE IF NEEDED FOR BREAKTHROUGH REFLUX.  OPV W/ SLF AUG 15 OR 23 @1130  PM E30 REMICADE INDUCED AUTOIMMUNE HEPATITIS, UC.Marland Kitchen

## 2017-01-02 NOTE — Discharge Summary (Signed)
Physician Discharge Summary  BHARAT ANTILLON XBD:[id number] DOB: December 18, 1990 DOA: 12/31/2016  PCP: Patient, No Pcp Per  Admit date: 12/31/2016 Discharge date: 01/02/2017  Admitted From: home Disposition:  home  Recommendations for Outpatient Follow-up:  1. Follow up with PCP in 1-2 weeks 2. Please obtain BMP/CBC in one week 3. Follow-up [facility] for further management of ulcerative colitis with total colectomy 4. Follow-up with Dr. [name] 8/15 5. Repeat LFT 8/10  Discharge Condition:Stable CODE STATUS:Full code Diet recommendation: Regular diet  Brief/Interim Summary: 26 year old male with a history of bipolar 1 disorder, ulcerative colitis, C. difficile, elevated liver function tests, presented to the hospital with increasing drowsiness. He was found to have elevated ammonia and hepatic encephalopathy. Treated with lactulose. After having several bowel movements, his mental status has improved. He'll be continued on lactulose daily when necessary.  Regarding elevated liver function tests he has been followed by gastroenterology. Dr. [name] followed him in the hospital. The patient had a liver biopsy done prior to admission. Results of this biopsy were available during his hospital course and indicated autoimmune hepatitis. Dr. [name] discussed his case with physicians at [facility] who agreed that his hepatitis may be related to recent Remicade dosing. It was recommended that the patient on prednisone and repeat LFTs on 8/10. He has been referred to [facility] to follow-up further management. He will also follow-up at [facility] regarding ulcerative colitis and management with possible total colectomy. The remainder of his medical issues have remained stable.  Discharge Diagnoses:  Principal Problem:   Altered mental status Active Problems:   Bipolar I disorder, most recent episode depressed (HCC)   Ulcerative colitis ([place])   Asthma   Anemia   Hyperammonemia (HCC)    Elevated LFTs   Autoimmune hepatitis [facility])    Discharge Instructions  Discharge Instructions    Diet - low sodium heart healthy    Complete by:  As directed    Increase activity slowly    Complete by:  As directed      Allergies as of 01/02/2017      Reactions   Amoxicillin-pot Clavulanate Nausea And Vomiting, Other (See Comments)   Has patient had a PCN reaction causing immediate rash, facial/tongue/throat swelling, SOB or lightheadedness with hypotension: No Has patient had a PCN reaction causing severe rash involving mucus membranes or skin necrosis: No Has patient had a PCN reaction that required hospitalization No Has patient had a PCN reaction occurring within the last 10 years: No If all of the above answers are "NO", then may proceed with Cephalosporin use.   Ibuprofen Other (See Comments)   Per pts MD he is unable to take this medication.    Omeprazole Nausea And Vomiting   Pineapple Swelling, Other (See Comments)   Reaction:  Lip swelling   Remicade [infliximab]    AUTOIMMUNE HEPATITIS   Strawberry Extract Swelling, Other (See Comments)   Reaction:  Lip swelling   Tomato Rash      Medication List    TAKE these medications   albuterol (2.5 MG/3ML) 0.083% nebulizer solution Commonly known as:  PROVENTIL Inhale 2.5 mg into the lungs every 6 (six) hours as needed for wheezing or shortness of breath.   albuterol 108 (90 Base) MCG/ACT inhaler Commonly known as:  PROVENTIL HFA;VENTOLIN HFA Inhale 2 puffs into the lungs every 4 (four) hours as needed for wheezing or shortness of breath (and/or cough).   atomoxetine 100 MG capsule Commonly known as:  STRATTERA  Take 100 mg by mouth daily.   cholecalciferol 1000 units tablet Commonly known as:  VITAMIN D Take 1 tablet (1,000 Units total) by mouth daily. START AFTER VITAMIN D 50,000 U TABLETS ARE COMPLETE What changed:  additional instructions   cloNIDine 0.1 MG tablet Commonly known as:  CATAPRES Take 0.1-0.2 mg  by mouth at bedtime.   divalproex 500 MG DR tablet Commonly known as:  DEPAKOTE Take 1 tablet (500 mg total) by mouth 2 (two) times daily.   EPIPEN 2-PAK 0.3 mg/0.3 mL Soaj injection Generic drug:  EPINEPHrine Inject 0.3 mg into the muscle once as needed (for severe allergic reaction).   lactulose 10 GM/15ML solution Commonly known as:  CHRONULAC Take 30 mLs (20 g total) by mouth daily as needed for mild constipation.   lidocaine 2 % solution Commonly known as:  XYLOCAINE 1-2 TSP PO 30 MINS PRIOR TO MEALS AND PRN FOR ABDOMINAL/CHEST PAIN. REPEAT DOSE Q4H. NO MORE> 8 DOSES/DAY.   loratadine 10 MG tablet Commonly known as:  CLARITIN Take 10 mg by mouth daily.   metoCLOPramide 10 MG tablet Commonly known as:  REGLAN Take 1 tablet (10 mg total) by mouth every 6 (six) hours as needed for nausea (nausea/headache).   montelukast 10 MG tablet Commonly known as:  SINGULAIR Take 1 tablet (10 mg total) by mouth at bedtime.   ondansetron 4 MG disintegrating tablet Commonly known as:  ZOFRAN ODT Take 1 tablet (4 mg total) by mouth every 8 (eight) hours as needed for nausea.   oxyCODONE 5 MG immediate release tablet Commonly known as:  ROXICODONE Take 1 tablet (5 mg total) by mouth every 4 (four) hours as needed for severe pain.   predniSONE 10 MG tablet Commonly known as:  DELTASONE 4 po daily   sertraline 100 MG tablet Commonly known as:  ZOLOFT Take 1 tablet (100 mg total) by mouth daily.   traZODone 100 MG tablet Commonly known as:  DESYREL Take 100 mg by mouth at bedtime.       Allergies  Allergen Reactions  . Amoxicillin-Pot Clavulanate Nausea And Vomiting and Other (See Comments)    Has patient had a PCN reaction causing immediate rash, facial/tongue/throat swelling, SOB or lightheadedness with hypotension: No Has patient had a PCN reaction causing severe rash involving mucus membranes or skin necrosis: No Has patient had a PCN reaction that required hospitalization  No Has patient had a PCN reaction occurring within the last 10 years: No If all of the above answers are "NO", then may proceed with Cephalosporin use.  . Ibuprofen Other (See Comments)    Per pts MD he is unable to take this medication.   . Omeprazole Nausea And Vomiting  . Pineapple Swelling and Other (See Comments)    Reaction:  Lip swelling  . Remicade [Infliximab]     AUTOIMMUNE HEPATITIS  . Strawberry Extract Swelling and Other (See Comments)    Reaction:  Lip swelling  . Tomato Rash    Consultations:  Gastroenterology   Procedures/Studies: Ct Abdomen Pelvis W Contrast  Result Date: 12/12/2016 CLINICAL DATA:  Subacute onset of nausea, vomiting and right upper quadrant abdominal pain. Initial encounter. EXAM: CT ABDOMEN AND PELVIS WITH CONTRAST TECHNIQUE: Multidetector CT imaging of the abdomen and pelvis was performed using the standard protocol following bolus administration of intravenous contrast. CONTRAST:  121m ISOVUE-300 IOPAMIDOL (ISOVUE-300) INJECTION 61% COMPARISON:  CT of the abdomen and pelvis performed 12/02/2016, and right upper quadrant ultrasound performed 12/04/2016 FINDINGS: Lower chest: The  visualized lung bases are grossly clear. The visualized portions of the mediastinum are unremarkable. Hepatobiliary: The liver is unremarkable in appearance. The gallbladder is unremarkable in appearance. The common bile duct remains normal in caliber. Pancreas: The pancreas is within normal limits. Spleen: The spleen is unremarkable in appearance. Adrenals/Urinary Tract: The adrenal glands are unremarkable in appearance. The kidneys are within normal limits. There is no evidence of hydronephrosis. No renal or ureteral stones are identified. No perinephric stranding is seen. Stomach/Bowel: The stomach is unremarkable in appearance. The small bowel is within normal limits. The appendix is normal in caliber, without evidence of appendicitis. The colon is unremarkable in appearance.  Vascular/Lymphatic: The abdominal aorta is unremarkable in appearance. The inferior vena cava is grossly unremarkable. No retroperitoneal lymphadenopathy is seen. No pelvic sidewall lymphadenopathy is identified. Reproductive: The bladder is decompressed and not well characterized. The prostate remains normal in size. Other: No additional soft tissue abnormalities are seen. Musculoskeletal: No acute osseous abnormalities are identified. The visualized musculature is unremarkable in appearance. IMPRESSION: Unremarkable contrast-enhanced CT of the abdomen and pelvis. Electronically Signed   By: Garald Balding M.D.   On: 12/12/2016 03:27   Nm Hepato W/eject Fract  Result Date: 12/17/2016 CLINICAL DATA:  RIGHT upper quadrant pain for few weeks, nausea after eating EXAM: NUCLEAR MEDICINE HEPATOBILIARY IMAGING WITH GALLBLADDER EF TECHNIQUE: Sequential images of the abdomen were obtained out to 60 minutes following intravenous administration of radiopharmaceutical. After oral ingestion of Ensure, gallbladder ejection fraction was determined. At 60 min, normal ejection fraction is greater than 33%. RADIOPHARMACEUTICALS:  5.5 mCi Tc-95m Choletec IV COMPARISON:  None FINDINGS: Normal tracer extraction from bloodstream indicating normal hepatocellular function. Normal excretion of tracer into biliary tree. Gallbladder visualized at 21 min. Small bowel visualized at 12 min. No hepatic retention of tracer. Subjectively normal emptying of tracer from gallbladder following fatty meal stimulation. Calculated gallbladder ejection fraction is 97%, normal. Patient reported no symptoms following Ensure ingestion. Normal gallbladder ejection fraction following Ensure ingestion is greater than 33% at 1 hour. IMPRESSION: Normal exam. Electronically Signed   By: MLavonia DanaM.D.   On: 12/17/2016 10:32   [place]opsy  Result Date: 12/19/2016 INDICATION: Abnormal LFTs EXAM: ULTRASOUND-GUIDED RANDOM LIVER CORE BIOPSY. MEDICATIONS: None.  ANESTHESIA/SEDATION: Fentanyl 50 mcg IV; Versed 1 mg IV Moderate Sedation Time:  10 The patient was continuously monitored during the procedure by the interventional radiology nurse under my direct supervision. FLUOROSCOPY TIME:  None COMPLICATIONS: None immediate. PROCEDURE: Informed written consent was obtained from the patient after a thorough discussion of the procedural risks, benefits and alternatives. All questions were addressed. Maximal Sterile Barrier Technique was utilized including caps, mask, sterile gowns, sterile gloves, sterile drape, hand hygiene and skin antiseptic. A timeout was performed prior to the initiation of the procedure. The right flank was prepped with ChloraPrep in a sterile fashion, and a sterile drape was applied covering the operative field. A sterile gown and sterile gloves were used for the procedure. Under sonographic guidance, an 17 gauge guide needle was advanced into the right lobe of the liver. Subsequently 4 18 gauge core biopsies were obtained. The guide needle was removed. Final imaging was performed. Patient tolerated the procedure well without complication. Vital sign monitoring by nursing staff during the procedure will continue as patient is in the special procedures unit for post procedure observation. FINDINGS: The images document guide needle placement within the right lobe of the liver. Post biopsy images demonstrate no hemorrhage. IMPRESSION: Successful ultrasound-guided core  biopsy within the right lobe of the liver. Electronically Signed   By: [name] M.D.   On: 12/19/2016 16:56   Dg Chest Port 1 View  Result Date: 12/23/2016 CLINICAL DATA:  Abdominal pain. EXAM: PORTABLE CHEST 1 VIEW COMPARISON:  07/22/2016 FINDINGS: Very poor inspiration. Allowing for that, heart size is normal and the lungs are clear. No effusions. No free air seen. IMPRESSION: Expiratory film.  No active disease identified. Electronically Signed   By: Nelson Chimes M.D.   On:  12/23/2016 15:56   Dg Shoulder Left  Result Date: 12/29/2016 CLINICAL DATA:  Left shoulder pain EXAM: LEFT SHOULDER - 2+ VIEW COMPARISON:  05/08/2012 FINDINGS: There is no evidence of fracture or dislocation. There is no evidence of arthropathy or other focal bone abnormality. Soft tissues are unremarkable. IMPRESSION: Negative. Electronically Signed   By: Donavan Foil M.D.   On: 12/29/2016 23:48   US Abdomen Limited Ruq  Result Date: 12/04/2016 CLINICAL DATA:  Two weeks of right upper quadrant pain with elevated liver function studies. History of ulcerative colitis. EXAM: ULTRASOUND ABDOMEN LIMITED RIGHT UPPER QUADRANT COMPARISON:  Abdominopelvic CT scan of December 02, 2016 FINDINGS: Gallbladder: No gallstones or wall thickening visualized. No sonographic Murphy sign noted by sonographer. Common bile duct: Diameter: 3.2 mm Liver: The hepatic echotexture is subjectively mildly increased. The surface contour is smooth. There is no focal mass or ductal dilation. IMPRESSION: No gallstones or sonographic evidence of acute cholecystitis. If there are clinical concerns of chronic gallbladder dysfunction, a nuclear medicine hepatobiliary scan with gallbladder ejection fraction determination may be useful. Mildly increased hepatic echotexture likely reflects fatty infiltrative change. Electronically Signed   By: David  Martinique M.D.   On: 12/04/2016 11:45      Subjective: Feeling better. Having some indigestion. Having bowel movements with lactulose.  Discharge Exam: Vitals:   01/02/17 0633 01/02/17 1500  BP: 135/75 (!) 142/97  Pulse: (!) 106 76  Resp:  20  Temp: 98.5 F (36.9 C) 98.7 F (37.1 C)   Vitals:   01/01/17 1954 01/01/17 2217 01/02/17 0633 01/02/17 1500  BP:  (!) 146/91 135/75 (!) 142/97  Pulse:  80 (!) 106 76  Resp:    20  Temp:  98.7 F (37.1 C) 98.5 F (36.9 C) 98.7 F (37.1 C)  TempSrc:  Oral Oral Oral  SpO2: 96% 100% 100% 99%  Weight:      Height:        General: Pt is  alert, awake, not in acute distress Cardiovascular: RRR, S1/S2 +, no rubs, no gallops Respiratory: CTA bilaterally, no wheezing, no rhonchi Abdominal: Soft, NT, ND, bowel sounds + Extremities: no edema, no cyanosis    The results of significant diagnostics from this hospitalization (including imaging, microbiology, ancillary and laboratory) are listed below for reference.     Microbiology: No results found for this or any previous visit (from the past 240 hour(s)).   Labs: BNP (last 3 results) No results for input(s): BNP in the last 8760 hours. Basic Metabolic Panel:  Recent Labs Lab 12/30/16 0224 12/31/16 1458 01/01/17 0600 01/02/17 0549  NA 137 134* 133* 136  K 4.5 4.3 4.2 3.5  CL 103 104 102 104  CO2 27 24 25 24   GLUCOSE 89 100* 103* 118*  BUN 5* <5* 6 5*  CREATININE 0.91 0.80 0.80 0.81  CALCIUM 9.3 9.3 8.6* 8.7*   Liver Function Tests:  Recent Labs Lab 12/30/16 0224 12/31/16 1458 01/01/17 0600 01/02/17 0549  AST 608*  507* 457* 483*  ALT 377* 331* 285* 265*  ALKPHOS 139* 154* 125 133*  BILITOT 2.1* 2.1* 1.9* 1.9*  PROT 7.8 7.6 7.2 7.1  ALBUMIN 3.2* 3.1* 3.0* 3.0*    Recent Labs Lab 12/30/16 0224 12/31/16 1458  LIPASE 33 27    Recent Labs Lab 12/31/16 1503 01/02/17 0549  AMMONIA 79* 54*   CBC:  Recent Labs Lab 12/30/16 0224 12/31/16 1458 01/01/17 0600 01/02/17 0549  WBC 7.3 7.9 8.4 7.6  NEUTROABS 3.8 4.1  --  4.3  HGB 12.8* 12.4* 12.0* 11.9*  HCT 38.3* 36.0* 35.8* 35.6*  MCV 81.0 80.0 80.8 80.9  PLT 191 189 215 205   Cardiac Enzymes: No results for input(s): CKTOTAL, CKMB, CKMBINDEX, TROPONINI in the last 168 hours. BNP: Invalid input(s): POCBNP CBG:  Recent Labs Lab 01/01/17 1123 01/01/17 2234 01/02/17 0803 01/02/17 1133 01/02/17 1653  GLUCAP 88 105* 132* 114* 110*   D-Dimer No results for input(s): DDIMER in the last 72 hours. Hgb A1c No results for input(s): HGBA1C in the last 72 hours. Lipid Profile No results  for input(s): CHOL, HDL, LDLCALC, TRIG, CHOLHDL, LDLDIRECT in the last 72 hours. Thyroid function studies No results for input(s): TSH, T4TOTAL, T3FREE, THYROIDAB in the last 72 hours.  Invalid input(s): FREET3 Anemia work up No results for input(s): VITAMINB12, FOLATE, FERRITIN, TIBC, IRON, RETICCTPCT in the last 72 hours. Urinalysis    Component Value Date/Time   COLORURINE AMBER (A) 12/23/2016 1225   APPEARANCEUR HAZY (A) 12/23/2016 1225   LABSPEC 1.027 12/23/2016 1225   PHURINE 5.0 12/23/2016 1225   GLUCOSEU NEGATIVE 12/23/2016 1225   HGBUR NEGATIVE 12/23/2016 1225   BILIRUBINUR MODERATE (A) 12/23/2016 1225   KETONESUR NEGATIVE 12/23/2016 1225   PROTEINUR 100 (A) 12/23/2016 1225   UROBILINOGEN 0.2 09/04/2014 2355   NITRITE NEGATIVE 12/23/2016 1225   LEUKOCYTESUR NEGATIVE 12/23/2016 1225   Sepsis Labs Invalid input(s): PROCALCITONIN,  WBC,  LACTICIDVEN Microbiology No results found for this or any previous visit (from the past 240 hour(s)).   Time coordinating discharge: Over 30 minutes  SIGNED:   Kathie Dike, MD  [facility] 01/02/2017, 5:16 PM Pager   If 7PM-7AM, please contact night-coverage [email or web address] Password [email or web address]

## 2017-01-02 NOTE — Progress Notes (Signed)
Inpatient. Copying to Dr. [name] and Walden Field who are in hospital today.

## 2017-01-03 NOTE — Telephone Encounter (Signed)
Needs CMP ON AUG 10.

## 2017-01-05 ENCOUNTER — Other Ambulatory Visit: Payer: Self-pay

## 2017-01-05 ENCOUNTER — Encounter: Payer: Self-pay | Admitting: Gastroenterology

## 2017-01-05 DIAGNOSIS — K51919 Ulcerative colitis, unspecified with unspecified complications: Secondary | ICD-10-CM

## 2017-01-05 NOTE — Telephone Encounter (Signed)
Received fax from [facility]. Requested Hep C Genotype and HCV RNA Quant Labs. Pt doesn't have those labs in chart. Faxed Hepatitis C Antibody results to [phone].

## 2017-01-05 NOTE — Telephone Encounter (Signed)
OV made and letter mailed °

## 2017-01-05 NOTE — Telephone Encounter (Signed)
LMOM for Wayne Avila to call back.

## 2017-01-05 NOTE — Telephone Encounter (Signed)
Referral info faxed to [facility].

## 2017-01-05 NOTE — Telephone Encounter (Signed)
PT's mom is aware.

## 2017-01-08 NOTE — Telephone Encounter (Signed)
Called [facility] to follow-up on referral. Receptionist said pt needs Hep C genotype and HCV RNA quant labs done within past 90 days before they will schedule appt. Sending message to [facility].

## 2017-01-08 NOTE — Telephone Encounter (Addendum)
PLEASE CALL PT's mother so he can complete the labs within 24 hrs.

## 2017-01-08 NOTE — Telephone Encounter (Signed)
Called and informed pt's mother, she will take him to have labs done.

## 2017-01-08 NOTE — Telephone Encounter (Signed)
[facility], pt has surgery appt 01/19/17 at 11:30am.

## 2017-01-08 NOTE — Addendum Note (Signed)
Addended by: [name] on: 01/08/2017 12:10 PM   Modules accepted: Orders

## 2017-01-09 ENCOUNTER — Ambulatory Visit (INDEPENDENT_AMBULATORY_CARE_PROVIDER_SITE_OTHER): Payer: Medicaid Other | Admitting: Family Medicine

## 2017-01-09 ENCOUNTER — Encounter: Payer: Self-pay | Admitting: Family Medicine

## 2017-01-09 VITALS — BP 120/86 | HR 98 | Temp 99.6°F | Resp 16 | Ht 68.5 in | Wt 228.8 lb

## 2017-01-09 DIAGNOSIS — K754 Autoimmune hepatitis: Secondary | ICD-10-CM

## 2017-01-09 DIAGNOSIS — J4520 Mild intermittent asthma, uncomplicated: Secondary | ICD-10-CM

## 2017-01-09 DIAGNOSIS — Z6834 Body mass index (BMI) 34.0-34.9, adult: Secondary | ICD-10-CM | POA: Diagnosis not present

## 2017-01-09 DIAGNOSIS — F909 Attention-deficit hyperactivity disorder, unspecified type: Secondary | ICD-10-CM | POA: Insufficient documentation

## 2017-01-09 DIAGNOSIS — F901 Attention-deficit hyperactivity disorder, predominantly hyperactive type: Secondary | ICD-10-CM

## 2017-01-09 DIAGNOSIS — Z6841 Body Mass Index (BMI) 40.0 and over, adult: Secondary | ICD-10-CM | POA: Insufficient documentation

## 2017-01-09 DIAGNOSIS — Z9109 Other allergy status, other than to drugs and biological substances: Secondary | ICD-10-CM

## 2017-01-09 DIAGNOSIS — E6609 Other obesity due to excess calories: Secondary | ICD-10-CM | POA: Diagnosis not present

## 2017-01-09 MED ORDER — LORATADINE 10 MG PO TABS: 10.0000 mg | ORAL_TABLET | Freq: Every day | ORAL | 11 refills | Status: DC

## 2017-01-09 MED ORDER — MONTELUKAST SODIUM 10 MG PO TABS: 10.0000 mg | ORAL_TABLET | Freq: Every day | ORAL | 11 refills | Status: DC

## 2017-01-09 NOTE — Progress Notes (Signed)
Chief Complaint  Patient presents with  . ADHD  . Abdominal Pain  new patient Under care of Dr [name] in GI for ulcerative colitis.  Recent difficulty with recurrent c diff infection. Was on Remicade but developed abdominal pain and elevated LFT.  A Liver Bx confirmed autoimmune hepatitis. He was taken off remicade and given a month f prednisone.  Is having rapid improvement in pain and symptoms.  Now feels back to normal. He has mild intermittent asthma and allergies.  Uses prn albuterol infrequently - a couple of times a year. He has ADHD and bipolar illness, also mental impairment.  Is disabled.  Lives with mentally disabled mother who is primary caregiver. They live with her mother and sister. [facility] and is [facility] with [facility].  Generally well behaved. Very unhealthy and unproductive lifestyle.  He plays video games all afternoon into the night.  Sleeps until noon.  No job or chores in the house.  No exercise.  Mother says since his most recent hospital stay they are trying to eat a more heart healthy diet.  hospital records and testing is reviewed.  Patient Active Problem List   Diagnosis Date Noted  . ADHD 01/09/2017  . Environmental allergies 01/09/2017  . Autoimmune hepatitis ([place]) 01/02/2017  . Asthma in adult, mild intermittent, uncomplicated 03/50/0938  . Anemia 12/31/2016  . Hyperammonemia ([place]) 12/31/2016  . Elevated LFTs 12/31/2016  . Abnormal thyroid function test 03/24/2016  . Ulcerative colitis ([place]) 12/29/2015  . Bipolar 1 disorder, manic, moderate ([place]) 11/09/2014  . Mental developmental delay 11/15/2012    Outpatient Encounter Prescriptions as of 01/09/2017  Medication Sig  . albuterol (PROVENTIL HFA;VENTOLIN HFA) 108 (90 Base) MCG/ACT inhaler Inhale 2 puffs into the lungs every 4 (four) hours as needed for wheezing or shortness of breath (and/or cough).  Marland Kitchen albuterol (PROVENTIL) (2.5 MG/3ML) 0.083% nebulizer solution Inhale 2.5 mg into the  lungs every 6 (six) hours as needed for wheezing or shortness of breath.   Marland Kitchen atomoxetine (STRATTERA) 100 MG capsule Take 100 mg by mouth daily.   . cholecalciferol (VITAMIN D) 1000 units tablet Take 1 tablet (1,000 Units total) by mouth daily. START AFTER VITAMIN D 50,000 U TABLETS ARE COMPLETE (Patient taking differently: Take 1,000 Units by mouth daily. )  . cloNIDine (CATAPRES) 0.1 MG tablet Take 0.1-0.2 mg by mouth at bedtime.   Marland Kitchen EPINEPHrine (EPIPEN 2-PAK) 0.3 mg/0.3 mL IJ SOAJ injection Inject 0.3 mg into the muscle once as needed (for severe allergic reaction).  Marland Kitchen lactulose (CHRONULAC) 10 GM/15ML solution Take 30 mLs (20 g total) by mouth daily as needed for mild constipation.  Marland Kitchen loratadine (CLARITIN) 10 MG tablet Take 10 mg by mouth daily.  . metoCLOPramide (REGLAN) 10 MG tablet Take 1 tablet (10 mg total) by mouth every 6 (six) hours as needed for nausea (nausea/headache).  . montelukast (SINGULAIR) 10 MG tablet Take 1 tablet (10 mg total) by mouth at bedtime.  . ondansetron (ZOFRAN ODT) 4 MG disintegrating tablet Take 1 tablet (4 mg total) by mouth every 8 (eight) hours as needed for nausea.  . Oxcarbazepine (TRILEPTAL) 300 MG tablet TK 1 T PO BID. START WITH 1/2 TABLET PO Q AM AND FOLLOW INSTRUCTIONS AS GIVEN IN OFFICE  . predniSONE (DELTASONE) 10 MG tablet 4 po daily  . sertraline (ZOLOFT) 100 MG tablet Take 1 tablet (100 mg total) by mouth daily.  . traZODone (DESYREL) 100 MG tablet Take 100 mg by mouth at bedtime.  No facility-administered encounter medications on file as of 01/09/2017.     Past Medical History:  Diagnosis Date  . ADHD (attention deficit hyperactivity disorder)   . Anxiety   . Asthma   . Autoimmune hepatitis ([place])   . Bipolar 1 disorder ([place])   . C. difficile colitis 03/22/2016  . Clostridium difficile colitis   . Depression   . GERD (gastroesophageal reflux disease)   . HOH (hard of hearing)   . Mental developmental delay   . Seizures ([place])   .  Ulcerative colitis [facility])     Past Surgical History:  Procedure Laterality Date  . ADENOIDECTOMY    . BIOPSY N/A 12/07/2012   Procedure: GASTRIC BIOPSIES;  Surgeon: [name], MD;  Location: [facility];  Service: Endoscopy;  Laterality: N/A;  . BIOPSY N/A 08/30/2013   Procedure: BIOPSY;  Surgeon: [name], MD;  Location: [facility] OR[facility];  Service: Endoscopy;  Laterality: N/A;  right colon,transverse colon, left colon, rectal biopsies  . BIOPSY  11/20/2015   Procedure: BIOPSY;  Surgeon: [name], MD;  Location: [facility];  Service: Endoscopy;;  ileal, right colon biopsy, left colon, rectum  . BIOPSY  04/18/2016   Procedure: BIOPSY;  Surgeon: [name], MD;  Location: [facility];  Service: Endoscopy;;  left descending colon biopsies  . COLONOSCOPY  11/20/2015   Dr. [name]: Severe erythema, edema, ulcers from the anal verge to 20 cm above the anal verge without mucosal sparing, remaining colon and terminal ileum appeared normal. Biopsies from the rectum revealed fulminant active chronic colitis consistent with IBD. Pathology from terminal ileum revealed intramucosal lymphoid aggregates. Remaining colon random biopsies with inactive chronic colitis  . COLONOSCOPY WITH PROPOFOL N/A 08/30/2013   YBW:LSLHTD mucosa in the terminal ileum/COLITIS/ MILD PROCTITIS. 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  Marland Kitchen COLONOSCOPY WITH PROPOFOL N/A 11/20/2015   Dr. 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 ESOPHAGOGASTRODUODENOSCOPY  11/20/2015   Dr. [name]: Normal exam, stomach biopsied and duodenal biopsy.. Benign biopsies.  . ESOPHAGOGASTRODUODENOSCOPY (EGD) WITH PROPOFOL N/A 12/07/2012   SLF:The mucosa of the esophagus appeared normal Non-erosive gastritis (inflammation) was found in the gastric antrum; multiple biopsies The duodenal mucosa showed no  abnormalities in the bulb and second portion of the duodenum  . ESOPHAGOGASTRODUODENOSCOPY (EGD) WITH PROPOFOL N/A 11/20/2015   Dr. [name]: normal with normal biopsies, negative H.pylori   . ESOPHAGOGASTRODUODENOSCOPY (EGD) WITH PROPOFOL N/A 04/18/2016   Procedure: ESOPHAGOGASTRODUODENOSCOPY (EGD) WITH PROPOFOL;  Surgeon: [name], MD;  [facility];  Service: Endoscopy;  Laterality: N/A;  . FLEXIBLE SIGMOIDOSCOPY N/A 04/18/2016   Procedure: FLEXIBLE SIGMOIDOSCOPY;  Surgeon: [name], MD;  [facility];  Service: Endoscopy;  Laterality: N/A;  . TONSILLECTOMY      Social History   Social History  . Marital status: Single    Spouse name: N/A  . Number of children: 1  . Years of education: 12th   Occupational History  .  Not Employed    disabled   Social History Main Topics  . Smoking status: Former Smoker    Packs/day: 0.00    Years: 2.00    Quit date: 10/06/2012  . Smokeless tobacco: Never Used     Comment: Never smoked cigarettes  . Alcohol use No     Comment: denies usage  . Drug use: No  .  Sexual activity: Yes    Birth control/ protection: None   Other Topics Concern  . Not on file   Social History Narrative   Lives with mother   Cory Roughen and Elenor Legato in the home.   Caffeine Use: occasionally   Likes to game late and sleep in until noon   [facility]    Family History  Problem Relation Age of Onset  . Asthma Mother   . Ulcers Mother   . Bipolar disorder Mother   . ADD / ADHD Father   . Diabetes Maternal Grandmother   . Hypertension Maternal Grandmother   . Sleep apnea Maternal Grandmother   . Heart disease Maternal Grandmother   . Diabetes Maternal Grandfather   . Hypertension Maternal Grandfather   . Sleep apnea Maternal Grandfather   . Heart disease Maternal Grandfather   . Cancer Maternal Grandfather        prostate  . Cancer Maternal Aunt        Leukemia  . Colon cancer Neg Hx   . Liver  disease Neg Hx     Review of Systems  Constitutional: Negative for chills, fever and weight loss.  HENT: Negative for congestion and hearing loss.   Eyes: Negative for blurred vision and pain.  Respiratory: Negative for cough and shortness of breath.   Cardiovascular: Negative for chest pain and leg swelling.  Gastrointestinal: Negative for abdominal pain, constipation, diarrhea and heartburn.       Abdominal pain improving  Genitourinary: Negative for dysuria and frequency.  Musculoskeletal: Negative for falls, joint pain and myalgias.  Neurological: Negative for dizziness, seizures and headaches.  Psychiatric/Behavioral: Negative for depression and substance abuse. The patient has insomnia. The patient is not nervous/anxious.        Bipolar - controlled    BP 120/86 (BP Location: Left Arm, Patient Position: Sitting, Cuff Size: Normal)   Pulse 98   Temp 99.6 F (37.6 C) (Other (Comment))   Resp 16   Ht 5' 8.5" (1.74 m)   Wt 228 lb 12 oz (103.8 kg)   SpO2 98%   BMI 34.28 kg/m   Physical Exam  Constitutional: He appears well-developed and well-nourished.  Obese.  Poor eye contact.  Poor response to questions, sometimes I have to shake him to get him to look at me and answer  HENT:  Head: Normocephalic and atraumatic.  Mouth/Throat: Oropharynx is clear and moist.  Eyes: Pupils are equal, round, and reactive to light. Conjunctivae are normal.  Neck: Normal range of motion.  Cardiovascular: Normal rate, regular rhythm and normal heart sounds.   Pulmonary/Chest: Effort normal and breath sounds normal.  Abdominal: Soft. Bowel sounds are normal. He exhibits no distension. There is no tenderness. There is no guarding.  Liver non tender  Musculoskeletal: Normal range of motion. He exhibits no edema.  Lymphadenopathy:    He has no cervical adenopathy.  Neurological: He is alert.  Psychiatric: His affect is inappropriate. He is withdrawn. He expresses impulsivity.  Immature.  Looks  to [employer] for answers.  Poor fund knowledge.  Poor judgement.  He is inattentive.  .ASSESSMENT/PLAN: Greater than 50% of this visit was spent in counseling and coordinating care.  Total face to face time:  45 min.  Discussed illness, prevention. Diet and exercise.  Lifestyle changes to improve health.  Mother acts bored and uninterested, has to be directly addressed to participate.   1. Attention deficit hyperactivity disorder (ADHD), predominantly hyperactive type treated  2.  Environmental allergies treated  3. Autoimmune hepatitis ([place]) Improved on prednisone  4. Asthma in adult, mild intermittent, uncomplicated Not currently active  5. Obesity Needs diet, exercise, lifestyle change.  Family dynamics make this unlikely  Patient Instructions  No change in medicines or treatment today  See me in 2 months  Call sooner or problems   Raylene Everts, MD

## 2017-01-09 NOTE — Patient Instructions (Signed)
No change in medicines or treatment today  See me in 2 months  Call sooner or problems

## 2017-01-12 LAB — HEPATITIS C RNA QUANTITATIVE
HCV Quantitative Log: <1.18 Log IU/mL
HCV Quantitative: <15 IU/mL

## 2017-01-13 NOTE — Telephone Encounter (Signed)
PT's mom is aware.

## 2017-01-14 NOTE — Progress Notes (Signed)
REVIEWED-NO ADDITIONAL RECOMMENDATIONS. 

## 2017-01-15 NOTE — Telephone Encounter (Signed)
Called [facility] to follow-up on referral. Pt has appt 01/26/17 at 8:40am with Dr. Marene Lenz.

## 2017-01-19 ENCOUNTER — Ambulatory Visit
Admission: RE | Admit: 2017-01-19 | Discharge: 2017-01-19 | Disposition: A | Discharge status: Home / Self Care | Payer: MEDICAID | Attending: Colon & Rectal Surgery | Admitting: Colon & Rectal Surgery

## 2017-01-19 DIAGNOSIS — K51919 Ulcerative colitis, unspecified with unspecified complications: Principal | ICD-10-CM

## 2017-01-20 DIAGNOSIS — F319 Bipolar disorder, unspecified: Secondary | ICD-10-CM | POA: Insufficient documentation

## 2017-01-20 DIAGNOSIS — Z87898 Personal history of other specified conditions: Secondary | ICD-10-CM | POA: Insufficient documentation

## 2017-01-26 ENCOUNTER — Ambulatory Visit
Admission: RE | Admit: 2017-01-26 | Discharge: 2017-01-26 | Disposition: A | Discharge status: Home / Self Care | Payer: MEDICAID | Attending: Internal Medicine | Admitting: Internal Medicine

## 2017-01-26 DIAGNOSIS — B1920 Unspecified viral hepatitis C without hepatic coma: Secondary | ICD-10-CM

## 2017-01-26 DIAGNOSIS — K51811 Other ulcerative colitis with rectal bleeding: Secondary | ICD-10-CM

## 2017-01-26 DIAGNOSIS — R945 Abnormal results of liver function studies: Principal | ICD-10-CM

## 2017-01-27 ENCOUNTER — Ambulatory Visit
Admission: RE | Admit: 2017-01-27 | Discharge: 2017-01-27 | Disposition: A | Discharge status: Home / Self Care | Payer: MEDICAID | Attending: Internal Medicine | Admitting: Internal Medicine

## 2017-01-27 DIAGNOSIS — K51811 Other ulcerative colitis with rectal bleeding: Secondary | ICD-10-CM

## 2017-01-27 DIAGNOSIS — R197 Diarrhea, unspecified: Principal | ICD-10-CM

## 2017-01-28 ENCOUNTER — Encounter (HOSPITAL_COMMUNITY): Payer: Self-pay | Admitting: Emergency Medicine

## 2017-01-28 ENCOUNTER — Emergency Department (HOSPITAL_COMMUNITY)
Admission: EM | Admit: 2017-01-28 | Discharge: 2017-01-29 | Disposition: A | Discharge status: Home / Self Care | Payer: Medicaid Other | Attending: Emergency Medicine | Admitting: Emergency Medicine

## 2017-01-28 DIAGNOSIS — R51 Headache: Secondary | ICD-10-CM | POA: Insufficient documentation

## 2017-01-28 DIAGNOSIS — R519 Headache, unspecified: Secondary | ICD-10-CM

## 2017-01-28 DIAGNOSIS — Z8669 Personal history of other diseases of the nervous system and sense organs: Secondary | ICD-10-CM | POA: Diagnosis not present

## 2017-01-28 DIAGNOSIS — Z79899 Other long term (current) drug therapy: Secondary | ICD-10-CM | POA: Diagnosis not present

## 2017-01-28 DIAGNOSIS — Z87891 Personal history of nicotine dependence: Secondary | ICD-10-CM | POA: Diagnosis not present

## 2017-01-28 DIAGNOSIS — J45909 Unspecified asthma, uncomplicated: Secondary | ICD-10-CM | POA: Insufficient documentation

## 2017-01-28 MED ORDER — SODIUM CHLORIDE 0.9 % IV BOLUS (SEPSIS)
1000.0000 mL | Freq: Once | INTRAVENOUS | Status: AC
  Administered 2017-01-28: 1000 mL via INTRAVENOUS

## 2017-01-28 MED ORDER — METOCLOPRAMIDE HCL 5 MG/ML IJ SOLN
10.0000 mg | Freq: Once | INTRAMUSCULAR | Status: AC
  Administered 2017-01-28: 10 mg via INTRAVENOUS
  Filled 2017-01-28: qty 2

## 2017-01-28 MED ORDER — DIPHENHYDRAMINE HCL 50 MG/ML IJ SOLN
25.0000 mg | Freq: Once | INTRAMUSCULAR | Status: AC
  Administered 2017-01-28: 25 mg via INTRAVENOUS
  Filled 2017-01-28: qty 1

## 2017-01-28 NOTE — ED Triage Notes (Signed)
Headache started 3 hours ago.  Pt started vomiting on the way to hospital. Intermittent fevers. No fever in triage.

## 2017-01-28 NOTE — ED Provider Notes (Signed)
[facility] DEPT Provider Note   CSN: [id number] Arrival date & time: 01/28/17  2241     History   Chief Complaint Chief Complaint  Patient presents with  . Headache    HPI Wayne Avila is a 26 y.o. male with history of asthma, ADHD, bipolar disorder and autoimmune hepatitis brought in by mother for headache with associated nausea and vomiting. Patient states that 3 hours ago (8 PM) began having a frontal headache that he describes as throbbing. The pain has gradually increased, is constant and worsens when moving his head side-to-side or when bending over. He describes associated photophobia and phonophobia. He states nothing alleviates his pain. He has not tried anything for his pain yet as she has an allergy to NSAIDs and cannot take Tylenol due to his autoimmune hepatitis. He has a history of headaches and this is same. He denies any fever, chills, neck stiffness, trauma, alcohol use, numbness or tingling in the extremities, or visual changes.   HPI  Past Medical History:  Diagnosis Date  . ADHD (attention deficit hyperactivity disorder)   . Anxiety   . Asthma   . Autoimmune hepatitis ([place])   . Bipolar 1 disorder ([place])   . C. difficile colitis 03/22/2016  . Clostridium difficile colitis   . Depression   . GERD (gastroesophageal reflux disease)   . HOH (hard of hearing)   . Mental developmental delay   . Seizures ([place])   . Ulcerative colitis [facility])     Patient Active Problem List   Diagnosis Date Noted  . ADHD 01/09/2017  . Environmental allergies 01/09/2017  . Class 1 obesity due to excess calories with body mass index (BMI) of 34.0 to 34.9 in adult 01/09/2017  . Autoimmune hepatitis ([place]) 01/02/2017  . Asthma in adult, mild intermittent, uncomplicated 33/83/2919  . Anemia 12/31/2016  . Hyperammonemia ([place]) 12/31/2016  . Elevated LFTs 12/31/2016  . Abnormal thyroid function test 03/24/2016  . Ulcerative colitis ([place]) 12/29/2015  . Bipolar 1 disorder, manic,  moderate ([place]) 11/09/2014  . Mental developmental delay 11/15/2012    Past Surgical History:  Procedure Laterality Date  . ADENOIDECTOMY    . BIOPSY N/A 12/07/2012   Procedure: GASTRIC BIOPSIES;  Surgeon: [name], MD;  Location: [facility];  Service: Endoscopy;  Laterality: N/A;  . BIOPSY N/A 08/30/2013   Procedure: BIOPSY;  Surgeon: [name], MD;  Location: [facility];  Service: Endoscopy;  Laterality: N/A;  right colon,transverse colon, left colon, rectal biopsies  . BIOPSY  11/20/2015   Procedure: BIOPSY;  Surgeon: [name], MD;  [facility];  Service: Endoscopy;;  ileal, right colon biopsy, left colon, rectum  . BIOPSY  04/18/2016   Procedure: BIOPSY;  Surgeon: [name], MD;  [facility];  Service: Endoscopy;;  left descending colon biopsies  . COLONOSCOPY  11/20/2015   Dr. [name]: Severe erythema, edema, ulcers from the anal verge to 20 cm above the anal verge without mucosal sparing, remaining colon and terminal ileum appeared normal. Biopsies from the rectum revealed fulminant active chronic colitis consistent with IBD. Pathology from terminal ileum revealed intramucosal lymphoid aggregates. Remaining colon random biopsies with inactive chronic colitis  . COLONOSCOPY WITH PROPOFOL N/A 08/30/2013   TYO:MAYOKH mucosa in the terminal ileum/COLITIS/ MILD PROCTITIS. 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  Marland Kitchen COLONOSCOPY WITH PROPOFOL N/A 11/20/2015   Dr. [name]: chronic inactive  pancolitis and active severe ulcerative proctitis   . ESOPHAGOGASTRODUODENOSCOPY  11/20/2015   Dr. [name]: Normal exam, stomach biopsied and duodenal biopsy.. Benign biopsies.  . ESOPHAGOGASTRODUODENOSCOPY (EGD) WITH PROPOFOL N/A 12/07/2012   SLF:The mucosa of the esophagus appeared normal Non-erosive gastritis (inflammation) was found in the gastric antrum; multiple  biopsies The duodenal mucosa showed no abnormalities in the bulb and second portion of the duodenum  . ESOPHAGOGASTRODUODENOSCOPY (EGD) WITH PROPOFOL N/A 11/20/2015   Dr. [name]: normal with normal biopsies, negative H.pylori   . ESOPHAGOGASTRODUODENOSCOPY (EGD) WITH PROPOFOL N/A 04/18/2016   Procedure: ESOPHAGOGASTRODUODENOSCOPY (EGD) WITH PROPOFOL;  Surgeon: [name], MD;  [facility];  Service: Endoscopy;  Laterality: N/A;  . FLEXIBLE SIGMOIDOSCOPY N/A 04/18/2016   Procedure: FLEXIBLE SIGMOIDOSCOPY;  Surgeon: [name], MD;  [facility];  Service: Endoscopy;  Laterality: N/A;  . TONSILLECTOMY         Home Medications    Prior to Admission medications   Medication Sig Start Date End Date Taking? Authorizing Provider  albuterol (PROVENTIL HFA;VENTOLIN HFA) 108 (90 Base) MCG/ACT inhaler Inhale 2 puffs into the lungs every 4 (four) hours as needed for wheezing or shortness of breath (and/or cough).    [provider]  albuterol (PROVENTIL) (2.5 MG/3ML) 0.083% nebulizer solution Inhale 2.5 mg into the lungs every 6 (six) hours as needed for wheezing or shortness of breath.     [provider]  atomoxetine (STRATTERA) 100 MG capsule Take 100 mg by mouth daily.     [provider]  cholecalciferol (VITAMIN D) 1000 units tablet Take 1 tablet (1,000 Units total) by mouth daily. START AFTER VITAMIN D 50,000 U TABLETS ARE COMPLETE Patient taking differently: Take 1,000 Units by mouth daily.  04/08/16   Fields, Marga Melnick, MD  cloNIDine (CATAPRES) 0.1 MG tablet Take 0.1-0.2 mg by mouth at bedtime.  11/06/16   [provider]  EPINEPHrine (EPIPEN 2-PAK) 0.3 mg/0.3 mL IJ SOAJ injection Inject 0.3 mg into the muscle once as needed (for severe allergic reaction).    [provider]  lactulose (CHRONULAC) 10 GM/15ML solution Take 30 mLs (20 g total) by mouth daily as needed for mild constipation. 01/02/17   Kathie Dike, MD    loratadine (CLARITIN) 10 MG tablet Take 1 tablet (10 mg total) by mouth daily. 01/09/17   Raylene Everts, MD  metoCLOPramide (REGLAN) 10 MG tablet Take 1 tablet (10 mg total) by mouth every 6 (six) hours as needed for nausea (nausea/headache). 12/23/16   Daleen Bo, MD  montelukast (SINGULAIR) 10 MG tablet Take 1 tablet (10 mg total) by mouth at bedtime. 01/09/17   Raylene Everts, MD  ondansetron (ZOFRAN ODT) 4 MG disintegrating tablet Take 1 tablet (4 mg total) by mouth every 8 (eight) hours as needed for nausea. 12/13/16   [name], MD  Oxcarbazepine (TRILEPTAL) 300 MG tablet TK 1 T PO BID. START WITH 1/2 TABLET PO Q AM AND FOLLOW INSTRUCTIONS AS GIVEN IN OFFICE 01/06/17   [provider]  predniSONE (DELTASONE) 10 MG tablet 4 po daily 01/02/17   Fields, Marga Melnick, MD  sertraline (ZOLOFT) 100 MG tablet Take 1 tablet (100 mg total) by mouth daily. 11/13/14   [name], NP  traZODone (DESYREL) 100 MG tablet Take 100 mg by mouth at bedtime.  09/10/16   [provider]    Family History Family History  Problem Relation Age of Onset  . Asthma Mother   . Ulcers  Mother   . Bipolar disorder Mother   . ADD / ADHD Father   . Diabetes Maternal Grandmother   . Hypertension Maternal Grandmother   . Sleep apnea Maternal Grandmother   . Heart disease Maternal Grandmother   . Diabetes Maternal Grandfather   . Hypertension Maternal Grandfather   . Sleep apnea Maternal Grandfather   . Heart disease Maternal Grandfather   . Cancer Maternal Grandfather        prostate  . Cancer Maternal Aunt        Leukemia  . Colon cancer Neg Hx   . Liver disease Neg Hx     Social History Social History  Substance Use Topics  . Smoking status: Former Smoker    Packs/day: 0.00    Years: 2.00    Quit date: 10/06/2012  . Smokeless tobacco: Never Used     Comment: Never smoked cigarettes  . Alcohol use No     Comment: denies usage     Allergies   Nsaids; Pineapple; Remicade  [infliximab]; Strawberry extract; Amoxicillin-pot clavulanate; Omeprazole; and Tomato   Review of Systems Review of Systems  All other systems reviewed and are negative.    Physical Exam Updated Vital Signs BP (!) 145/84   Pulse 83   Temp 98.3 F (36.8 C)   Resp 18   Ht 5' 8"  (1.727 m)   Wt 104.3 kg (230 lb)   SpO2 97%   BMI 34.97 kg/m   Physical Exam  Constitutional: He appears well-developed and well-nourished.  HENT:  Head: Normocephalic and atraumatic.  Right Ear: External ear normal.  Left Ear: External ear normal.  Nose: Nose normal.  Mouth/Throat: Uvula is midline, oropharynx is clear and moist and mucous membranes are normal. No tonsillar exudate.  Eyes: Pupils are equal, round, and reactive to light. Right eye exhibits no discharge. Left eye exhibits no discharge. No scleral icterus.  Neck: Trachea normal. Neck supple. No spinous process tenderness present. No neck rigidity. Normal range of motion present.  Cardiovascular: Normal rate, regular rhythm and intact distal pulses.   No murmur heard. Pulses:      Radial pulses are 2+ on the right side, and 2+ on the left side.       Dorsalis pedis pulses are 2+ on the right side, and 2+ on the left side.       Posterior tibial pulses are 2+ on the right side, and 2+ on the left side.  No lower extremity swelling or edema. Calves symmetric in size bilaterally.  Pulmonary/Chest: Effort normal and breath sounds normal. He exhibits no tenderness.  Abdominal: Soft. Bowel sounds are normal. There is no tenderness. There is no rebound and no guarding.  Musculoskeletal: He exhibits no edema.  Lymphadenopathy:    He has no cervical adenopathy.  Neurological: He is alert.  Follows commands. PERRLA. EOMI. Grossly moves all extremities 4 without ataxia. Strength for age to upper and lower extremities bilaterally including grip strength. Sensation to light touch intact bilaterally for upper and lower.   Skin: Skin is warm and  dry. No rash noted. He is not diaphoretic.  Psychiatric: He has a normal mood and affect.  Nursing note and vitals reviewed.    ED Treatments / Results  Labs (all labs ordered are listed, but only abnormal results are displayed) Labs Reviewed - No data to display  EKG  EKG Interpretation None       Radiology No results found.  Procedures Procedures (including critical care time)  Medications Ordered in ED Medications  sodium chloride 0.9 % bolus 1,000 mL (0 mLs Intravenous Stopped 01/29/17 0022)  diphenhydrAMINE (BENADRYL) injection 25 mg (25 mg Intravenous Given 01/28/17 2321)  metoCLOPramide (REGLAN) injection 10 mg (10 mg Intravenous Given 01/28/17 2321)     Initial Impression / Assessment and Plan / ED Course  I have reviewed the triage vital signs and the nursing notes.  Pertinent labs & imaging results that were available during my care of the patient were reviewed by me and considered in my medical decision making (see chart for details).     Patient with HA. Patient HA similar to past HA with associated photophobia and throbbing in nature. Will give IV fluids, IV Reglan and IV Benadryl for symptoms. Discussed treatment plan with patient and mother at bedside who are in agreement with plan. Patient noted to have Autoimmune hepatitis. Most recent INR wnl (1.39) which would not cause increase risk for bleeds.   11:56 PM Patient noted to be sleeping, resting comfortably.   12:43 AM Patient states symptoms have resolved.   Pt HA treated and improved while in ED.  Presentation is like pts typical HA and non concerning for [facility], ICH, Meningitis, or temporal arteritis. Pt is afebrile with no focal neuro deficits, nuchal rigidity, or change in vision. Pt is to follow up with PCP to discuss prophylactic medication. Pt verbalizes understanding and is agreeable with plan to dc.   Patient case discussed with Dr. Rolland Porter who is in agreement with plan.  Final Clinical  Impressions(s) / ED Diagnoses   Final diagnoses:  Bad headache    New Prescriptions New Prescriptions   No medications on file     Lorelle Gibbs 01/29/17 0057    Rolland Porter, MD 01/29/17 [phone]

## 2017-01-29 ENCOUNTER — Encounter: Payer: Self-pay | Admitting: Gastroenterology

## 2017-01-29 ENCOUNTER — Ambulatory Visit (INDEPENDENT_AMBULATORY_CARE_PROVIDER_SITE_OTHER): Payer: Medicaid Other | Admitting: Gastroenterology

## 2017-01-29 DIAGNOSIS — K51011 Ulcerative (chronic) pancolitis with rectal bleeding: Secondary | ICD-10-CM | POA: Diagnosis not present

## 2017-01-29 DIAGNOSIS — K754 Autoimmune hepatitis: Secondary | ICD-10-CM | POA: Diagnosis not present

## 2017-01-29 MED ORDER — ONDANSETRON HCL 4 MG PO TABS: 4.0000 mg | ORAL_TABLET | Freq: Three times a day (TID) | ORAL | 0 refills | Status: DC | PRN

## 2017-01-29 NOTE — Assessment & Plan Note (Addendum)
DUE TO UC. LIVER ENZYMES IMPROVED WITH PREDNISONE. CURRENTLY BEING TAPERED BY [facility].   Continue PREDNISONE TAPER. USE LACTULOSE IF NEEDED FOR CONFUSION. FOLLOW UP IN 4 MOS.  OK TO USE IBUPROFEN 400 MG EVERY 8 HRS IF NEEDED FOR FEVER. CALL WITH QUESTIONS OR CONCERNS.

## 2017-01-29 NOTE — Discharge Instructions (Signed)
Please follow up with PCP this week. Please follow up with Neurology for further workup. Please discuss if preventative/prophylactic medication would be indicated for him. If you develop worsening or new concerning symptoms you can return to the emergency department for re-evaluation.   Get help right away if: Your migraine gets very bad. You have a fever. You have a stiff neck. You have trouble seeing. Your muscles feel weak or like you cannot control them. You start to lose your balance a lot. You start to have trouble walking. You pass out (faint).

## 2017-01-29 NOTE — Patient Instructions (Addendum)
Continue PREDNISONE TAPER. PLEASE CALL [facility] IF HE HAS MORE LOOSE STOOLS OR BLOOD IN HIS STOOL AS HIS PREDNISONE DOSE IS LOWERED. IF YOU CANNOT GET A RESPONSE THEN CALL DR. FIELDS.  USE LACTULOSE IF NEEDED FOR CONFUSION.   OK TO USE IBUPROFEN 400 MG EVERY 8 HRS IF NEEDED FOR FEVER.  FOLLOW UP IN 4 MOS.   PLEASE CALL WITH QUESTIONS OR CONCERNS.

## 2017-01-29 NOTE — Assessment & Plan Note (Addendum)
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  Continue PREDNISONE TAPER. PLEASE CALL [facility] OR DR. Ahnesty Finfrock IF HE HAS MORE LOOSE STOOLS OR BLOOD IN HIS STOOL AS HIS PREDNISONE DOSE IS LOWERED.  MOM AGREES TO CONTINUE WITH MEDICAL MANAGEMENT AT THIS TIME. SAW [facility]. AWAITING TCS AT [facility] TO DETERMINE SURGERY OPTIONS.  FOLLOW UP IN 4 MOS.   CALL WITH QUESTIONS OR CONCERNS.

## 2017-01-29 NOTE — Progress Notes (Signed)
Subjective:    Patient ID: Wayne Avila, male    DOB: [birth year], 26 y.o.   MRN: [id number] Raylene Everts, MD   HPI Seen at [facility] by gi and surgery. Felt Trileptal may cause elevated ammonia and mental status changes. PLAN FOR TCS AND TRY ADDITIONAL MEDICATION. PREDNISONE 30 MG DOWN FOR TWO WEEKS THEN 20 MG FOR ONE WEEK AND THEN 15 MG FOR 7 DAYS THEN 10 MG FOR ONE WEEK AND THEN 5 MG DAILY FOR WEEK AND THE STOP. BMs: SOFT AND MAY BE UP TO FOUR TIMES A DAY AND NOW BROWN MIXED WITH BLOOD. NOT SURE IF STOOL GETTING MORE SOFT WHILE TAPERING. HAD STOOL STUDIES AT [facility] AND C DIFF /STOOL PANEL NEGATIVE. TEMP AS HIGH AS 102F. Had open the other night. HAD MIGRAINE LAST NIGHT AND HAD NAUSEA AND VOMITING(REGLAN WITH BENADRYL AT ED.) MOM FEELS LIKE THINGS ARE GETTING BETTER. NO SORES IN MOUTH, RASH ON LEGS, JOINT PAIN, OR BACK PAIN.  PT DENIES CHILLS, HEMATEMESIS, melena, diarrhea, CHEST PAIN, SHORTNESS OF BREATH, CHANGE IN BOWEL IN HABITS, constipation, abdominal pain, problems swallowing, OR heartburn or indigestion.  Past Medical History:  Diagnosis Date  . ADHD (attention deficit hyperactivity disorder)   . Anxiety   . Asthma   . Autoimmune hepatitis ([place])   . Bipolar 1 disorder ([place])   . C. difficile colitis 03/22/2016  . Clostridium difficile colitis   . Depression   . GERD (gastroesophageal reflux disease)   . HOH (hard of hearing)   . Mental developmental delay   . Seizures ([place])   . Ulcerative colitis [facility])     Past Surgical History:  Procedure Laterality Date  . ADENOIDECTOMY    . BIOPSY N/A 12/07/2012   Procedure: GASTRIC BIOPSIES;  Surgeon: [name], MD;  Location: [facility];  Service: Endoscopy;  Laterality: N/A;  . BIOPSY N/A 08/30/2013   Procedure: BIOPSY;  Surgeon: [name], MD;  Location: [facility];  Service: Endoscopy;  Laterality: N/A;  right colon,transverse colon, left colon, rectal biopsies  . BIOPSY  11/20/2015   Procedure: BIOPSY;  Surgeon: [name], MD;  Location: [facility];  Service: Endoscopy;;  ileal, right colon biopsy, left colon, rectum  . BIOPSY  04/18/2016   Procedure: BIOPSY;  Surgeon: [name], MD;  Location: [facility];  Service: Endoscopy;;  left descending colon biopsies  . COLONOSCOPY  11/20/2015   Dr. [name]: Severe erythema, edema, ulcers from the anal verge to 20 cm above the anal verge without mucosal sparing, remaining colon and terminal ileum appeared normal. Biopsies from the rectum revealed fulminant active chronic colitis consistent with IBD. Pathology from terminal ileum revealed intramucosal lymphoid aggregates. Remaining colon random biopsies with inactive chronic colitis  . COLONOSCOPY WITH PROPOFOL N/A 08/30/2013   VEH:MCNOBS mucosa in the terminal ileum/COLITIS/ MILD PROCTITIS. 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  Marland Kitchen COLONOSCOPY WITH PROPOFOL N/A 11/20/2015   Dr. 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 ESOPHAGOGASTRODUODENOSCOPY  11/20/2015   Dr. [name]: Normal exam, stomach biopsied and duodenal biopsy.. Benign biopsies.  . ESOPHAGOGASTRODUODENOSCOPY (EGD) WITH PROPOFOL N/A 12/07/2012   SLF:The mucosa of the esophagus appeared normal Non-erosive gastritis (inflammation) was found in the gastric antrum; multiple biopsies The duodenal mucosa showed no abnormalities in the bulb and second portion of the duodenum  . ESOPHAGOGASTRODUODENOSCOPY (EGD) WITH PROPOFOL N/A  11/20/2015   Dr. [name]: normal with normal biopsies, negative H.pylori   . ESOPHAGOGASTRODUODENOSCOPY (EGD) WITH PROPOFOL N/A 04/18/2016   Procedure: ESOPHAGOGASTRODUODENOSCOPY (EGD) WITH PROPOFOL;  Surgeon: [name], MD;  [facility];  Service: Endoscopy;  Laterality: N/A;  . FLEXIBLE SIGMOIDOSCOPY N/A 04/18/2016   Procedure: FLEXIBLE SIGMOIDOSCOPY;  Surgeon: [name], MD;  [facility];  Service: Endoscopy;  Laterality: N/A;  . TONSILLECTOMY      Allergies  Allergen Reactions  . Nsaids Other (See Comments)    Should not take due to GI condition  . Pineapple Swelling and Other (See Comments)    Reaction:  Lip swelling  . Remicade [Infliximab] Other (See Comments)    AUTOIMMUNE HEPATITIS  . Strawberry Extract Swelling    Reaction:  Lip swelling  . Amoxicillin-Pot Clavulanate Nausea And Vomiting and Other (See Comments)    GI intolerance   . Omeprazole Nausea And Vomiting  . Tomato Rash    Current Outpatient Prescriptions  Medication Sig Dispense Refill  . albuterol (PROVENTIL HFA;VENTOLIN HFA) 108 (90 Base) MCG/ACT inhaler Inhale 2 puffs into the lungs every 4 (four) hours as needed for wheezing or shortness of breath (and/or cough).    Marland Kitchen albuterol (PROVENTIL) (2.5 MG/3ML) 0.083% nebulizer solution Inhale 2.5 mg into the lungs every 6 (six) hours as needed for wheezing or shortness of breath.     Marland Kitchen atomoxetine (STRATTERA) 100 MG capsule Take 100 mg by mouth daily.     . cholecalciferol (VITAMIN D) 1000 units tablet Take 1 tablet (1,000 Units total) by mouth daily. START AFTER VITAMIN D 50,000 U TABLETS ARE COMPLETE (Patient taking differently: Take 1,000 Units by mouth daily. )    . cloNIDine (CATAPRES) 0.1 MG tablet Take 0.1-0.2 mg by mouth at bedtime.     Marland Kitchen EPINEPHrine (EPIPEN 2-PAK) 0.3 mg/0.3 mL IJ SOAJ injection Inject 0.3 mg into the muscle once as needed (for severe allergic reaction).    Marland Kitchen lactulose (CHRONULAC) 10 GM/15ML solution Take 30 mLs (20 g total) by mouth daily as needed for mild constipation.    Marland Kitchen loratadine (CLARITIN) 10 MG tablet Take 1 tablet (10 mg total) by mouth daily.    . metoCLOPramide (REGLAN) 10 MG tablet Take 1 tablet (10 mg total) by mouth every 6 (six) hours as needed for nausea (nausea/headache).    . montelukast (SINGULAIR) 10 MG tablet Take 1 tablet (10 mg total) by mouth at bedtime.    .  ondansetron (ZOFRAN ODT) 4 MG disintegrating tablet Take 1 tablet (4 mg total) by mouth every 8 (eight) hours as needed for nausea.    . ondansetron (ZOFRAN) 4 MG tablet Take 1 tablet (4 mg total) by mouth every 8 (eight) hours as needed for nausea or vomiting.    . Oxcarbazepine (TRILEPTAL) 300 MG tablet TK 1 T PO BID. START WITH 1/2 TABLET PO Q AM AND FOLLOW INSTRUCTIONS AS GIVEN IN OFFICE    . predniSONE (DELTASONE) 10 MG tablet 3 po daily    . sertraline (ZOLOFT) 100 MG tablet Take 1 tablet (100 mg total) by mouth daily.    . traZODone (DESYREL) 100 MG tablet Take 100 mg by mouth at bedtime.      Review of Systems PER HPI OTHERWISE ALL SYSTEMS ARE NEGATIVE.    Objective:   Physical Exam  Constitutional: He is oriented to person, place, and time. He appears well-developed and well-nourished. No distress.  HENT:  Head: Normocephalic and atraumatic.  Mouth/Throat:  Oropharynx is clear and moist. No oropharyngeal exudate.  Eyes: Pupils are equal, round, and reactive to light. No scleral icterus.  Neck: Normal range of motion. Neck supple.  Cardiovascular: Normal rate, regular rhythm and normal heart sounds.   Pulmonary/Chest: Effort normal and breath sounds normal. No respiratory distress.  Abdominal: Soft. Bowel sounds are normal. He exhibits no distension. There is no tenderness.  Musculoskeletal: He exhibits no edema.  Lymphadenopathy:    He has no cervical adenopathy.  Neurological: He is alert and oriented to person, place, and time.  NO FOCAL DEFICITS  Skin: No rash noted.  Psychiatric: He has a normal mood and affect.  Vitals reviewed.     Assessment & Plan:

## 2017-01-30 NOTE — Progress Notes (Signed)
cc'ed to pcp °

## 2017-01-31 ENCOUNTER — Emergency Department (HOSPITAL_COMMUNITY)
Admission: EM | Admit: 2017-01-31 | Discharge: 2017-01-31 | Disposition: A | Discharge status: Home / Self Care | Payer: Medicaid Other | Attending: Emergency Medicine | Admitting: Emergency Medicine

## 2017-01-31 ENCOUNTER — Encounter (HOSPITAL_COMMUNITY): Payer: Self-pay | Admitting: Emergency Medicine

## 2017-01-31 DIAGNOSIS — R21 Rash and other nonspecific skin eruption: Secondary | ICD-10-CM | POA: Diagnosis not present

## 2017-01-31 DIAGNOSIS — Z79899 Other long term (current) drug therapy: Secondary | ICD-10-CM | POA: Insufficient documentation

## 2017-01-31 DIAGNOSIS — B081 Molluscum contagiosum: Secondary | ICD-10-CM | POA: Insufficient documentation

## 2017-01-31 DIAGNOSIS — J4520 Mild intermittent asthma, uncomplicated: Secondary | ICD-10-CM | POA: Insufficient documentation

## 2017-01-31 DIAGNOSIS — L600 Ingrowing nail: Secondary | ICD-10-CM | POA: Diagnosis not present

## 2017-01-31 DIAGNOSIS — Z87891 Personal history of nicotine dependence: Secondary | ICD-10-CM | POA: Insufficient documentation

## 2017-01-31 DIAGNOSIS — M79674 Pain in right toe(s): Secondary | ICD-10-CM | POA: Diagnosis present

## 2017-01-31 NOTE — Discharge Instructions (Signed)
I suspect your rash may be a viral infection called molluscum contagiosum as discussed. This is contagious but should go away on its own.  Avoid having anyone else touch your rash to avoid spreading it and wash your hands frequently as well if you touch these sites.  Soak your toe in warm epsom salt water twice daily for 15 minutes.  As discussed, as this nail grows out, try to get it to grow over the cuticle at the end of your toe (wedge a piece of cotton as discussed).  There is no sign of infection in your toe at this time.

## 2017-01-31 NOTE — ED Triage Notes (Signed)
PT has ingrown toenail on right great toe.

## 2017-02-02 ENCOUNTER — Inpatient Hospital Stay (HOSPITAL_COMMUNITY): Admission: RE | Admit: 2017-02-02 | Payer: Self-pay | Source: Ambulatory Visit

## 2017-02-02 NOTE — Progress Notes (Signed)
ON RECALL  °

## 2017-02-03 NOTE — ED Provider Notes (Signed)
[facility] DEPT Provider Note   CSN: [id number] Arrival date & time: 01/31/17  1749     History   Chief Complaint Chief Complaint  Patient presents with  . Toe Pain  . Rash    HPI Wayne Avila is a 26 y.o. male who is well known to this ed and has a history of asthma, autoimmune hepatitis, GERD, seizure and ulcerative colitis presenting with 2 complaints, the first being pain at his right great toe which he suspects is an ingrown toenail.  He denies swelling of the toe, drainage, redness or radiation of pain, but has constant pain along the cuticle edge.  He denies injury to the toe and wears shoes that are properly sized.  He has had no treatment prior to arrival.   Secondly, he reports an itchy rash on his back, posterior neck and upper arms with a few lesions on is abdomen which he first noticed this past week.  He endorses his daughter has a similar rash.  There has been no drainage from the rash, he denies swelling, redness, fevers, chills, body aches, sore throat.  He denies any new exposures, foods, insect bites.  The history is provided by the patient.    Past Medical History:  Diagnosis Date  . ADHD (attention deficit hyperactivity disorder)   . Anxiety   . Asthma   . Autoimmune hepatitis ([place])   . Bipolar 1 disorder ([place])   . C. difficile colitis 03/22/2016  . Clostridium difficile colitis   . Depression   . GERD (gastroesophageal reflux disease)   . HOH (hard of hearing)   . Mental developmental delay   . Seizures ([place])   . Ulcerative colitis [facility])     Patient Active Problem List   Diagnosis Date Noted  . ADHD 01/09/2017  . Environmental allergies 01/09/2017  . Class 1 obesity due to excess calories with body mass index (BMI) of 34.0 to 34.9 in adult 01/09/2017  . Autoimmune hepatitis ([place]) 01/02/2017  . Asthma in adult, mild intermittent, uncomplicated 15/17/6160  . Anemia 12/31/2016  . Hyperammonemia ([place]) 12/31/2016  . Elevated LFTs 12/31/2016  .  Abnormal thyroid function test 03/24/2016  . Ulcerative colitis ([place]) 12/29/2015  . Bipolar 1 disorder, manic, moderate ([place]) 11/09/2014  . Mental developmental delay 11/15/2012    Past Surgical History:  Procedure Laterality Date  . ADENOIDECTOMY    . BIOPSY N/A 12/07/2012   Procedure: GASTRIC BIOPSIES;  Surgeon: [name], MD;  Location: [facility];  Service: Endoscopy;  Laterality: N/A;  . BIOPSY N/A 08/30/2013   Procedure: BIOPSY;  Surgeon: [name], MD;  Location: [facility];  Service: Endoscopy;  Laterality: N/A;  right colon,transverse colon, left colon, rectal biopsies  . BIOPSY  11/20/2015   Procedure: BIOPSY;  Surgeon: [name], MD;  [facility];  Service: Endoscopy;;  ileal, right colon biopsy, left colon, rectum  . BIOPSY  04/18/2016   Procedure: BIOPSY;  Surgeon: [name], MD;  [facility];  Service: Endoscopy;;  left descending colon biopsies  . COLONOSCOPY  11/20/2015   Dr. [name]: Severe erythema, edema, ulcers from the anal verge to 20 cm above the anal verge without mucosal sparing, remaining colon and terminal ileum appeared normal. Biopsies from the rectum revealed fulminant active chronic colitis consistent with IBD. Pathology from terminal ileum revealed intramucosal lymphoid aggregates. Remaining colon random biopsies with inactive chronic colitis  . COLONOSCOPY WITH PROPOFOL N/A 08/30/2013   VPX:TGGYIR mucosa in  the terminal ileum/COLITIS/ MILD PROCTITIS. 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  Marland Kitchen COLONOSCOPY WITH PROPOFOL N/A 11/20/2015   Dr. 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 ESOPHAGOGASTRODUODENOSCOPY  11/20/2015   Dr. [name]: Normal exam, stomach biopsied and duodenal biopsy.. Benign biopsies.  . ESOPHAGOGASTRODUODENOSCOPY (EGD) WITH PROPOFOL N/A 12/07/2012   SLF:The mucosa of  the esophagus appeared normal Non-erosive gastritis (inflammation) was found in the gastric antrum; multiple biopsies The duodenal mucosa showed no abnormalities in the bulb and second portion of the duodenum  . ESOPHAGOGASTRODUODENOSCOPY (EGD) WITH PROPOFOL N/A 11/20/2015   Dr. [name]: normal with normal biopsies, negative H.pylori   . ESOPHAGOGASTRODUODENOSCOPY (EGD) WITH PROPOFOL N/A 04/18/2016   Procedure: ESOPHAGOGASTRODUODENOSCOPY (EGD) WITH PROPOFOL;  Surgeon: [name], MD;  [facility];  Service: Endoscopy;  Laterality: N/A;  . FLEXIBLE SIGMOIDOSCOPY N/A 04/18/2016   Procedure: FLEXIBLE SIGMOIDOSCOPY;  Surgeon: [name], MD;  [facility];  Service: Endoscopy;  Laterality: N/A;  . TONSILLECTOMY         Home Medications    Prior to Admission medications   Medication Sig Start Date End Date Taking? Authorizing Provider  albuterol (PROVENTIL HFA;VENTOLIN HFA) 108 (90 Base) MCG/ACT inhaler Inhale 2 puffs into the lungs every 4 (four) hours as needed for wheezing or shortness of breath (and/or cough).    [provider]  albuterol (PROVENTIL) (2.5 MG/3ML) 0.083% nebulizer solution Inhale 2.5 mg into the lungs every 6 (six) hours as needed for wheezing or shortness of breath.     [provider]  atomoxetine (STRATTERA) 100 MG capsule Take 100 mg by mouth daily.     [provider]  cholecalciferol (VITAMIN D) 1000 units tablet Take 1 tablet (1,000 Units total) by mouth daily. START AFTER VITAMIN D 50,000 U TABLETS ARE COMPLETE Patient taking differently: Take 1,000 Units by mouth daily.  04/08/16   Fields, Marga Melnick, MD  cloNIDine (CATAPRES) 0.1 MG tablet Take 0.1-0.2 mg by mouth at bedtime.  11/06/16   [provider]  EPINEPHrine (EPIPEN 2-PAK) 0.3 mg/0.3 mL IJ SOAJ injection Inject 0.3 mg into the muscle once as needed (for severe allergic reaction).    [provider]  lactulose (CHRONULAC) 10 GM/15ML solution  Take 30 mLs (20 g total) by mouth daily as needed for mild constipation. 01/02/17   Kathie Dike, MD  loratadine (CLARITIN) 10 MG tablet Take 1 tablet (10 mg total) by mouth daily. 01/09/17   Raylene Everts, MD  metoCLOPramide (REGLAN) 10 MG tablet Take 1 tablet (10 mg total) by mouth every 6 (six) hours as needed for nausea (nausea/headache). 12/23/16   Daleen Bo, MD  montelukast (SINGULAIR) 10 MG tablet Take 1 tablet (10 mg total) by mouth at bedtime. 01/09/17   Raylene Everts, MD  ondansetron (ZOFRAN ODT) 4 MG disintegrating tablet Take 1 tablet (4 mg total) by mouth every 8 (eight) hours as needed for nausea. 12/13/16   [name], MD  ondansetron (ZOFRAN) 4 MG tablet Take 1 tablet (4 mg total) by mouth every 8 (eight) hours as needed for nausea or vomiting. 01/29/17   Maczis, Barth Kirks, PA-C  Oxcarbazepine (TRILEPTAL) 300 MG tablet TK 1 T PO BID. START WITH 1/2 TABLET PO Q AM AND FOLLOW INSTRUCTIONS AS GIVEN IN OFFICE 01/06/17   [provider]  predniSONE (DELTASONE) 10 MG tablet 4 po daily 01/02/17  Fields, [name], MD  sertraline (ZOLOFT) 100 MG tablet Take 1 tablet (100 mg total) by mouth daily. 11/13/14   [name], NP  traZODone (DESYREL) 100 MG tablet Take 100 mg by mouth at bedtime.  09/10/16   [provider]    Family History Family History  Problem Relation Age of Onset  . Asthma Mother   . Ulcers Mother   . Bipolar disorder Mother   . ADD / ADHD Father   . Diabetes Maternal Grandmother   . Hypertension Maternal Grandmother   . Sleep apnea Maternal Grandmother   . Heart disease Maternal Grandmother   . Diabetes Maternal Grandfather   . Hypertension Maternal Grandfather   . Sleep apnea Maternal Grandfather   . Heart disease Maternal Grandfather   . Cancer Maternal Grandfather        prostate  . Cancer Maternal Aunt        Leukemia  . Colon cancer Neg Hx   . Liver disease Neg Hx     Social History Social History  Substance Use Topics  .  Smoking status: Former Smoker    Packs/day: 0.00    Years: 2.00    Quit date: 10/06/2012  . Smokeless tobacco: Never Used     Comment: Never smoked cigarettes  . Alcohol use No     Comment: denies usage     Allergies   Nsaids; Pineapple; Remicade [infliximab]; Strawberry extract; Amoxicillin-pot clavulanate; Omeprazole; and Tomato   Review of Systems Review of Systems  Constitutional: Negative for chills and fever.  HENT: Negative for sore throat.   Respiratory: Negative for shortness of breath and wheezing.   Musculoskeletal: Positive for arthralgias.  Skin: Positive for rash. Negative for color change and wound.  Neurological: Negative for numbness.     Physical Exam Updated Vital Signs BP 127/87 (BP Location: Right Arm)   Pulse 100   Temp 98 F (36.7 C) (Oral)   Resp 16   Ht 5' 9"  (1.753 m)   Wt 105.2 kg (232 lb)   SpO2 100%   BMI 34.26 kg/m   Physical Exam  Constitutional: He appears well-developed and well-nourished.  HENT:  Head: Atraumatic.  Neck: Normal range of motion.  Cardiovascular:  Pulses equal bilaterally  Musculoskeletal: He exhibits tenderness.  ttp along cuticle edge of right great toe. No erythema, edema or drainage.  Nail plate is cut very short, but the corner is found and is not "spiking" into the cuticle.   Neurological: He is alert. He has normal strength. He displays normal reflexes. No sensory deficit.  Skin: Skin is warm and dry. Rash noted.  Scattered small raised flesh colored papules, 2-3 mm diameter, some with central core and umbilical appearance. Most prominent upper back and neck, with fewer lesions on abdomen.   Psychiatric: He has a normal mood and affect.     ED Treatments / Results  Labs (all labs ordered are listed, but only abnormal results are displayed) Labs Reviewed - No data to display  EKG  EKG Interpretation None       Radiology No results found.  Procedures Procedures (including critical care  time)  Medications Ordered in ED Medications - No data to display   Initial Impression / Assessment and Plan / ED Course  I have reviewed the triage vital signs and the nursing notes.  Pertinent labs & imaging results that were available during my care of the patient were reviewed by me and considered in my medical decision  making (see chart for details).     Pt with no signs of infection or ingrown toenail at this time, no tx needed - discussed ways to avoid ingrown nails and cuticle infections.   Rash most consistent with molluscum contagiosum - instructions given regarding course, avoiding direct contact, plan f/u with pcp for a recheck prn.  Final Clinical Impressions(s) / ED Diagnoses   Final diagnoses:  Ingrown nail  Molluscum contagiosum    New Prescriptions Discharge Medication List as of 01/31/2017  7:19 PM       Evalee Jefferson, PA-C 02/03/17 [place], [place], DO 02/05/17 1626

## 2017-02-04 ENCOUNTER — Ambulatory Visit: Admit: 2017-02-04 | Discharge: 2017-02-04 | Payer: MEDICAID | Attending: Internal Medicine

## 2017-02-04 DIAGNOSIS — K754 Autoimmune hepatitis: Principal | ICD-10-CM

## 2017-02-12 ENCOUNTER — Ambulatory Visit
Admission: RE | Admit: 2017-02-12 | Discharge: 2017-02-12 | Disposition: A | Discharge status: Home / Self Care | Payer: MEDICAID | Attending: Anesthesiology | Admitting: Anesthesiology

## 2017-02-12 ENCOUNTER — Ambulatory Visit
Admission: RE | Admit: 2017-02-12 | Discharge: 2017-02-12 | Disposition: A | Discharge status: Home / Self Care | Payer: MEDICAID | Attending: Gastroenterology | Admitting: Gastroenterology

## 2017-02-12 DIAGNOSIS — K5190 Ulcerative colitis, unspecified, without complications: Principal | ICD-10-CM

## 2017-02-21 ENCOUNTER — Emergency Department (HOSPITAL_COMMUNITY)
Admission: EM | Admit: 2017-02-21 | Discharge: 2017-02-21 | Disposition: A | Discharge status: Home / Self Care | Payer: Medicaid Other | Attending: Emergency Medicine | Admitting: Emergency Medicine

## 2017-02-21 ENCOUNTER — Encounter (HOSPITAL_COMMUNITY): Payer: Self-pay | Admitting: Emergency Medicine

## 2017-02-21 DIAGNOSIS — Z79899 Other long term (current) drug therapy: Secondary | ICD-10-CM | POA: Diagnosis not present

## 2017-02-21 DIAGNOSIS — Z87891 Personal history of nicotine dependence: Secondary | ICD-10-CM | POA: Diagnosis not present

## 2017-02-21 DIAGNOSIS — T7840XA Allergy, unspecified, initial encounter: Secondary | ICD-10-CM

## 2017-02-21 DIAGNOSIS — J45909 Unspecified asthma, uncomplicated: Secondary | ICD-10-CM | POA: Diagnosis not present

## 2017-02-21 DIAGNOSIS — R21 Rash and other nonspecific skin eruption: Secondary | ICD-10-CM | POA: Diagnosis present

## 2017-02-21 MED ORDER — METHYLPREDNISOLONE SODIUM SUCC 125 MG IJ SOLR
125.0000 mg | Freq: Once | INTRAMUSCULAR | Status: AC
  Administered 2017-02-21: 125 mg via INTRAVENOUS
  Filled 2017-02-21: qty 2

## 2017-02-21 MED ORDER — SODIUM CHLORIDE 0.9 % IV BOLUS (SEPSIS)
500.0000 mL | Freq: Once | INTRAVENOUS | Status: AC
  Administered 2017-02-21: 500 mL via INTRAVENOUS

## 2017-02-21 MED ORDER — FAMOTIDINE IN NACL 20-0.9 MG/50ML-% IV SOLN
20.0000 mg | Freq: Once | INTRAVENOUS | Status: AC
  Administered 2017-02-21: 20 mg via INTRAVENOUS
  Filled 2017-02-21: qty 50

## 2017-02-21 MED ORDER — FAMOTIDINE 20 MG PO TABS
20.0000 mg | ORAL_TABLET | Freq: Two times a day (BID) | ORAL | 0 refills | Status: DC
Start: 2017-02-21 — End: 2020-04-04

## 2017-02-21 MED ORDER — DIPHENHYDRAMINE HCL 50 MG/ML IJ SOLN
25.0000 mg | Freq: Once | INTRAMUSCULAR | Status: AC
  Administered 2017-02-21: 25 mg via INTRAVENOUS
  Filled 2017-02-21: qty 1

## 2017-02-21 NOTE — ED Provider Notes (Signed)
[facility] DEPT Provider Note   CSN: [id number] Arrival date & time: 02/21/17  1514     History   Chief Complaint Chief Complaint  Patient presents with  . Allergic Reaction    HPI Wayne Avila is a 26 y.o. male.  Mother states patient started with a rash on his neck and back and then complained of some numbness and tingling in his throat. He ate some tomatoes which he does have allergy to. Patient is mentally handicapped   The history is provided by a relative. No language interpreter was used.  Allergic Reaction  Presenting symptoms: itching and rash   Severity:  Moderate Prior allergic episodes:  Food/nut allergies Context: not animal exposure   Relieved by:  Nothing Worsened by:  Nothing   Past Medical History:  Diagnosis Date  . ADHD (attention deficit hyperactivity disorder)   . Anxiety   . Asthma   . Autoimmune hepatitis ([place])   . Bipolar 1 disorder ([place])   . C. difficile colitis 03/22/2016  . Clostridium difficile colitis   . Depression   . GERD (gastroesophageal reflux disease)   . HOH (hard of hearing)   . Mental developmental delay   . Seizures ([place])   . Ulcerative colitis [facility])     Patient Active Problem List   Diagnosis Date Noted  . ADHD 01/09/2017  . Environmental allergies 01/09/2017  . Class 1 obesity due to excess calories with body mass index (BMI) of 34.0 to 34.9 in adult 01/09/2017  . Autoimmune hepatitis ([place]) 01/02/2017  . Asthma in adult, mild intermittent, uncomplicated 19/50/9326  . Anemia 12/31/2016  . Hyperammonemia ([place]) 12/31/2016  . Elevated LFTs 12/31/2016  . Abnormal thyroid function test 03/24/2016  . Ulcerative colitis ([place]) 12/29/2015  . Bipolar 1 disorder, manic, moderate ([place]) 11/09/2014  . Mental developmental delay 11/15/2012    Past Surgical History:  Procedure Laterality Date  . ADENOIDECTOMY    . BIOPSY N/A 12/07/2012   Procedure: GASTRIC BIOPSIES;  Surgeon: [name], MD;  Location: [facility] ORS;   Service: Endoscopy;  Laterality: N/A;  . BIOPSY N/A 08/30/2013   Procedure: BIOPSY;  Surgeon: [name], MD;  Location: [facility] ORS;  Service: Endoscopy;  Laterality: N/A;  right colon,transverse colon, left colon, rectal biopsies  . BIOPSY  11/20/2015   Procedure: BIOPSY;  Surgeon: [name], MD;  Location: [facility] ENDO SUITE;  Service: Endoscopy;;  ileal, right colon biopsy, left colon, rectum  . BIOPSY  04/18/2016   Procedure: BIOPSY;  Surgeon: [name], MD;  Location: [facility];  Service: Endoscopy;;  left descending colon biopsies  . COLONOSCOPY  11/20/2015   Dr. [name]: Severe erythema, edema, ulcers from the anal verge to 20 cm above the anal verge without mucosal sparing, remaining colon and terminal ileum appeared normal. Biopsies from the rectum revealed fulminant active chronic colitis consistent with IBD. Pathology from terminal ileum revealed intramucosal lymphoid aggregates. Remaining colon random biopsies with inactive chronic colitis  . COLONOSCOPY WITH PROPOFOL N/A 08/30/2013   ZTI:WPYKDX mucosa in the terminal ileum/COLITIS/ MILD PROCTITIS. 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  Marland Kitchen COLONOSCOPY WITH PROPOFOL N/A 11/20/2015   Dr. 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 ESOPHAGOGASTRODUODENOSCOPY  11/20/2015   Dr. [name]: Normal exam, stomach biopsied and duodenal biopsy.. Benign biopsies.  . ESOPHAGOGASTRODUODENOSCOPY (EGD) WITH PROPOFOL N/A 12/07/2012   SLF:The mucosa of  the esophagus appeared normal Non-erosive gastritis (inflammation) was found in the gastric antrum; multiple biopsies The duodenal mucosa showed no abnormalities in the bulb and second portion of the duodenum  . ESOPHAGOGASTRODUODENOSCOPY (EGD) WITH PROPOFOL N/A 11/20/2015   Dr. [name]: normal with normal biopsies, negative H.pylori   .  ESOPHAGOGASTRODUODENOSCOPY (EGD) WITH PROPOFOL N/A 04/18/2016   Procedure: ESOPHAGOGASTRODUODENOSCOPY (EGD) WITH PROPOFOL;  Surgeon: [name], MD;  Location[facility];  Service: Endoscopy;  Laterality: N/A;  . FLEXIBLE SIGMOIDOSCOPY N/A 04/18/2016   Procedure: FLEXIBLE SIGMOIDOSCOPY;  Surgeon: [name], MD;  Location: [facility];  Service: Endoscopy;  Laterality: N/A;  . TONSILLECTOMY         Home Medications    Prior to Admission medications   Medication Sig Start Date End Date Taking? Authorizing Provider  albuterol (PROVENTIL HFA;VENTOLIN HFA) 108 (90 Base) MCG/ACT inhaler Inhale 2 puffs into the lungs every 4 (four) hours as needed for wheezing or shortness of breath (and/or cough).    [provider]  albuterol (PROVENTIL) (2.5 MG/3ML) 0.083% nebulizer solution Inhale 2.5 mg into the lungs every 6 (six) hours as needed for wheezing or shortness of breath.     [provider]  atomoxetine (STRATTERA) 100 MG capsule Take 100 mg by mouth daily.     [provider]  cholecalciferol (VITAMIN D) 1000 units tablet Take 1 tablet (1,000 Units total) by mouth daily. START AFTER VITAMIN D 50,000 U TABLETS ARE COMPLETE Patient taking differently: Take 1,000 Units by mouth daily.  04/08/16   Fields, Marga Melnick, MD  cloNIDine (CATAPRES) 0.1 MG tablet Take 0.1-0.2 mg by mouth at bedtime.  11/06/16   [provider]  EPINEPHrine (EPIPEN 2-PAK) 0.3 mg/0.3 mL IJ SOAJ injection Inject 0.3 mg into the muscle once as needed (for severe allergic reaction).    [provider]  famotidine (PEPCID) 20 MG tablet Take 1 tablet (20 mg total) by mouth 2 (two) times daily. 02/21/17   Milton Ferguson, MD  lactulose (CHRONULAC) 10 GM/15ML solution Take 30 mLs (20 g total) by mouth daily as needed for mild constipation. 01/02/17   Kathie Dike, MD  loratadine (CLARITIN) 10 MG tablet Take 1 tablet (10 mg total) by mouth daily. 01/09/17   Raylene Everts, MD    metoCLOPramide (REGLAN) 10 MG tablet Take 1 tablet (10 mg total) by mouth every 6 (six) hours as needed for nausea (nausea/headache). 12/23/16   Daleen Bo, MD  montelukast (SINGULAIR) 10 MG tablet Take 1 tablet (10 mg total) by mouth at bedtime. 01/09/17   Raylene Everts, MD  ondansetron (ZOFRAN ODT) 4 MG disintegrating tablet Take 1 tablet (4 mg total) by mouth every 8 (eight) hours as needed for nausea. Patient not taking: Reported on 02/21/2017 12/13/16   [name], MD  ondansetron [facility]) 4 MG tablet Take 1 tablet (4 mg total) by mouth every 8 (eight) hours as needed for nausea or vomiting. 01/29/17   Maczis, Barth Kirks, PA-C  Oxcarbazepine (TRILEPTAL) 300 MG tablet Take one tablet by mouth twice daily/as directed per prescriber 01/06/17   [provider]  predniSONE (DELTASONE) 10 MG tablet 4 po daily Patient taking differently: Take 40 mg by mouth daily.  01/02/17   Fields, Marga Melnick, MD  sertraline (ZOLOFT) 100 MG tablet Take 1 tablet (100 mg total) by mouth daily. 11/13/14   [name], NP  traZODone (DESYREL) 100 MG tablet Take 100 mg by mouth at bedtime.  09/10/16  [provider]    Family History Family History  Problem Relation Age of Onset  . Asthma Mother   . Ulcers Mother   . Bipolar disorder Mother   . ADD / ADHD Father   . Diabetes Maternal Grandmother   . Hypertension Maternal Grandmother   . Sleep apnea Maternal Grandmother   . Heart disease Maternal Grandmother   . Diabetes Maternal Grandfather   . Hypertension Maternal Grandfather   . Sleep apnea Maternal Grandfather   . Heart disease Maternal Grandfather   . Cancer Maternal Grandfather        prostate  . Cancer Maternal Aunt        Leukemia  . Colon cancer Neg Hx   . Liver disease Neg Hx     Social History Social History  Substance Use Topics  . Smoking status: Former Smoker    Packs/day: 0.00    Years: 2.00    Quit date: 10/06/2012  . Smokeless tobacco: Never Used     Comment:  Never smoked cigarettes  . Alcohol use No     Comment: denies usage     Allergies   Nsaids; Pineapple; Remicade [infliximab]; Strawberry extract; Amoxicillin-pot clavulanate; Omeprazole; and Tomato   Review of Systems Review of Systems  Constitutional: Negative for appetite change and fatigue.  HENT: Negative for congestion, ear discharge and sinus pressure.   Eyes: Negative for discharge.  Respiratory: Negative for cough.   Cardiovascular: Negative for chest pain.  Gastrointestinal: Negative for abdominal pain and diarrhea.  Genitourinary: Negative for frequency and hematuria.  Musculoskeletal: Negative for back pain.  Skin: Positive for itching and rash.  Neurological: Negative for seizures and headaches.  Psychiatric/Behavioral: Negative for hallucinations.     Physical Exam Updated Vital Signs BP 123/76 (BP Location: Left Arm)   Pulse (!) 102   Temp 98.5 F (36.9 C) (Oral)   Resp 18   Ht 5' 8.5" (1.74 m)   Wt 105.2 kg (232 lb)   SpO2 100%   BMI 34.76 kg/m   Physical Exam  Constitutional: He is oriented to person, place, and time. He appears well-developed.  HENT:  Head: Normocephalic.  Eyes: Conjunctivae and EOM are normal. No scleral icterus.  Neck: Neck supple. No thyromegaly present.  Cardiovascular: Normal rate and regular rhythm.  Exam reveals no gallop and no friction rub.   No murmur heard. Pulmonary/Chest: No stridor. He has no wheezes. He has no rales. He exhibits no tenderness.  Abdominal: He exhibits no distension. There is no tenderness. There is no rebound.  Musculoskeletal: Normal range of motion. He exhibits no edema.  Lymphadenopathy:    He has no cervical adenopathy.  Neurological: He is oriented to person, place, and time. He exhibits normal muscle tone. Coordination normal.  Skin: Rash noted. There is erythema.  Patient with a minor rash to neck and upper back. Oropharynx normal  Psychiatric: He has a normal mood and affect. His behavior  is normal.     ED Treatments / Results  Labs (all labs ordered are listed, but only abnormal results are displayed) Labs Reviewed - No data to display  EKG  EKG Interpretation None       Radiology No results found.  Procedures Procedures (including critical care time)  Medications Ordered in ED Medications  sodium chloride 0.9 % bolus 500 mL (0 mLs Intravenous Stopped 02/21/17 1706)  famotidine (PEPCID) IVPB 20 mg premix (0 mg Intravenous Stopped 02/21/17 1653)  methylPREDNISolone sodium succinate (SOLU-MEDROL) 125 mg/2 mL  injection 125 mg (125 mg Intravenous Given 02/21/17 1603)  diphenhydrAMINE (BENADRYL) injection 25 mg (25 mg Intravenous Given 02/21/17 1600)     Initial Impression / Assessment and Plan / ED Course  I have reviewed the triage vital signs and the nursing notes.  Pertinent labs & imaging results that were available during my care of the patient were reviewed by me and considered in my medical decision making (see chart for details).    Patient with minor rash and allergic reaction. He is taking prednisone already and will be sent home on Pepcid and Benadryl will follow-up as needed Final Clinical Impressions(s) / ED Diagnoses   Final diagnoses:  Allergic reaction, initial encounter    New Prescriptions New Prescriptions   FAMOTIDINE (PEPCID) 20 MG TABLET    Take 1 tablet (20 mg total) by mouth 2 (two) times daily.     Milton Ferguson, MD 02/21/17 [phone]

## 2017-02-21 NOTE — ED Triage Notes (Signed)
Patient c/o rash and itching to throat. Per mother patient started having rash this morning. Patient given benadryl with no relief. Patient allergic to tomatoes and had some in sauce this morning. Patient denies any difficulty swallowing but reports some difficulty breathing. Patient able to handle oral secretions.

## 2017-02-21 NOTE — ED Notes (Signed)
Pt showing no signs of distress. MD at bedside

## 2017-02-21 NOTE — ED Notes (Signed)
Pt states his throat feels "much better" denies any tightness or itching. Will continue to monitor pt at this time.

## 2017-02-21 NOTE — Discharge Instructions (Signed)
Take benadryl 25 mg every 4-6 hours for rash or itching

## 2017-03-12 ENCOUNTER — Ambulatory Visit (INDEPENDENT_AMBULATORY_CARE_PROVIDER_SITE_OTHER): Payer: Medicaid Other | Admitting: Family Medicine

## 2017-03-12 ENCOUNTER — Encounter: Payer: Self-pay | Admitting: Family Medicine

## 2017-03-12 ENCOUNTER — Other Ambulatory Visit: Payer: Self-pay | Admitting: Family Medicine

## 2017-03-12 VITALS — BP 120/82 | HR 92 | Temp 96.7°F | Resp 16 | Ht 69.0 in | Wt 231.0 lb

## 2017-03-12 DIAGNOSIS — J4520 Mild intermittent asthma, uncomplicated: Secondary | ICD-10-CM

## 2017-03-12 DIAGNOSIS — Z23 Encounter for immunization: Secondary | ICD-10-CM

## 2017-03-12 DIAGNOSIS — D649 Anemia, unspecified: Secondary | ICD-10-CM | POA: Diagnosis not present

## 2017-03-12 DIAGNOSIS — R21 Rash and other nonspecific skin eruption: Secondary | ICD-10-CM | POA: Diagnosis not present

## 2017-03-12 NOTE — Patient Instructions (Signed)
Walk every day that you are able No change in medicines  See me in 6 months Call sooner for problems

## 2017-03-12 NOTE — Progress Notes (Signed)
Chief Complaint  Patient presents with  . Follow-up    2 month  here for follow up Prednisone is being tapered, now on only 1/2 tab a day No abdominal pain or diarrhea Had a colonoscopy and the results are reviewed - mild inflammation. Went to the ER for a rash,  Diagnosed with molluscum.  He says it is spreading and it itches Agrees to a flu shot No recent wheeze or flare  Patient Active Problem List   Diagnosis Date Noted  . ADHD 01/09/2017  . Environmental allergies 01/09/2017  . Class 1 obesity due to excess calories with body mass index (BMI) of 34.0 to 34.9 in adult 01/09/2017  . Autoimmune hepatitis ([place]) 01/02/2017  . Asthma in adult, mild intermittent, uncomplicated 40/98/1191  . Anemia 12/31/2016  . Hyperammonemia ([place]) 12/31/2016  . Elevated LFTs 12/31/2016  . Abnormal thyroid function test 03/24/2016  . Ulcerative colitis ([place]) 12/29/2015  . Bipolar 1 disorder, manic, moderate ([place]) 11/09/2014  . Mental developmental delay 11/15/2012    Outpatient Encounter Prescriptions as of 03/12/2017  Medication Sig  . albuterol (PROVENTIL HFA;VENTOLIN HFA) 108 (90 Base) MCG/ACT inhaler Inhale 2 puffs into the lungs every 4 (four) hours as needed for wheezing or shortness of breath (and/or cough).  Marland Kitchen albuterol (PROVENTIL) (2.5 MG/3ML) 0.083% nebulizer solution Inhale 2.5 mg into the lungs every 6 (six) hours as needed for wheezing or shortness of breath.   . cholecalciferol (VITAMIN D) 1000 units tablet Take 1 tablet (1,000 Units total) by mouth daily. START AFTER VITAMIN D 50,000 U TABLETS ARE COMPLETE  . cloNIDine HCl (KAPVAY) 0.1 MG TB12 ER tablet Take 0.1 mg by mouth 2 (two) times daily.  Marland Kitchen EPINEPHrine (EPIPEN 2-PAK) 0.3 mg/0.3 mL IJ SOAJ injection Inject 0.3 mg into the muscle once as needed (for severe allergic reaction).  . famotidine (PEPCID) 20 MG tablet Take 1 tablet (20 mg total) by mouth 2 (two) times daily. (Patient taking differently: Take 20 mg by mouth daily. )    . lactulose (CHRONULAC) 10 GM/15ML solution Take 30 mLs (20 g total) by mouth daily as needed for mild constipation.  Marland Kitchen loratadine (CLARITIN) 10 MG tablet Take 1 tablet (10 mg total) by mouth daily.  . metoCLOPramide (REGLAN) 10 MG tablet Take 1 tablet (10 mg total) by mouth every 6 (six) hours as needed for nausea (nausea/headache).  . montelukast (SINGULAIR) 10 MG tablet Take 1 tablet (10 mg total) by mouth at bedtime.  . ondansetron (ZOFRAN ODT) 4 MG disintegrating tablet Take 1 tablet (4 mg total) by mouth every 8 (eight) hours as needed for nausea.  . ondansetron (ZOFRAN) 4 MG tablet Take 1 tablet (4 mg total) by mouth every 8 (eight) hours as needed for nausea or vomiting.  . Oxcarbazepine (TRILEPTAL) 300 MG tablet Take one tablet by mouth twice daily/as directed per prescriber  . paliperidone (INVEGA) 3 MG 24 hr tablet Take 3 mg by mouth daily.  . predniSONE (DELTASONE) 10 MG tablet 4 po daily (Patient taking differently: Take 2 mg by mouth daily. )  . sertraline (ZOLOFT) 100 MG tablet Take 1 tablet (100 mg total) by mouth daily.   No facility-administered encounter medications on file as of 03/12/2017.     Allergies  Allergen Reactions  . Nsaids Other (See Comments)    Should not take due to GI condition  . Pineapple Swelling and Other (See Comments)    Reaction:  Lip swelling  . Remicade [Infliximab] Other (See Comments)  AUTOIMMUNE HEPATITIS  . Strawberry Extract Swelling    Reaction:  Lip swelling  . Amoxicillin-Pot Clavulanate Nausea And Vomiting and Other (See Comments)    GI intolerance   . Omeprazole Nausea And Vomiting  . Tomato Rash    Review of Systems  Constitutional: Negative for chills, fatigue, fever and unexpected weight change.  HENT: Negative for congestion and dental problem.   Eyes: Negative for photophobia and visual disturbance.  Respiratory: Negative for cough and shortness of breath.   Cardiovascular: Negative for chest pain and palpitations.   Gastrointestinal: Negative for abdominal pain, constipation and diarrhea.  Genitourinary: Negative for difficulty urinating and urgency.  Musculoskeletal: Negative for arthralgias and back pain.  Skin: Positive for rash.  Neurological: Negative for light-headedness and headaches.  Psychiatric/Behavioral:       Mother states no behavior problem    BP 120/82 (BP Location: Right Arm, Patient Position: Sitting, Cuff Size: Large)   Pulse 92   Temp (!) 96.7 F (35.9 C) (Temporal)   Resp 16   Ht 5' 9"  (1.753 m)   Wt 231 lb (104.8 kg)   SpO2 99%   BMI 34.11 kg/m   Physical Exam  ASSESSMENT/PLAN:  1. Asthma in adult, mild intermittent, uncomplicated  2. Anemia, unspecified type  3. Needs flu shot  4. Rash of back - Ambulatory referral to Dermatology  5. Need for influenza vaccination - Flu Vaccine QUAD 36+ mos IM   Patient Instructions  Walk every day that you are able No change in medicines  See me in 6 months Call sooner for problems   Raylene Everts, MD

## 2017-03-15 ENCOUNTER — Encounter (HOSPITAL_COMMUNITY): Payer: Self-pay | Admitting: *Deleted

## 2017-03-15 ENCOUNTER — Emergency Department (HOSPITAL_COMMUNITY)
Admission: EM | Admit: 2017-03-15 | Discharge: 2017-03-15 | Disposition: A | Discharge status: Home / Self Care | Payer: Medicaid Other | Attending: Emergency Medicine | Admitting: Emergency Medicine

## 2017-03-15 DIAGNOSIS — E860 Dehydration: Secondary | ICD-10-CM | POA: Diagnosis not present

## 2017-03-15 DIAGNOSIS — R112 Nausea with vomiting, unspecified: Secondary | ICD-10-CM | POA: Insufficient documentation

## 2017-03-15 DIAGNOSIS — R51 Headache: Secondary | ICD-10-CM | POA: Diagnosis not present

## 2017-03-15 DIAGNOSIS — Z79899 Other long term (current) drug therapy: Secondary | ICD-10-CM | POA: Diagnosis not present

## 2017-03-15 DIAGNOSIS — R519 Headache, unspecified: Secondary | ICD-10-CM

## 2017-03-15 DIAGNOSIS — R197 Diarrhea, unspecified: Secondary | ICD-10-CM | POA: Diagnosis present

## 2017-03-15 DIAGNOSIS — Z87891 Personal history of nicotine dependence: Secondary | ICD-10-CM | POA: Insufficient documentation

## 2017-03-15 DIAGNOSIS — Z8719 Personal history of other diseases of the digestive system: Secondary | ICD-10-CM | POA: Diagnosis not present

## 2017-03-15 DIAGNOSIS — R1010 Upper abdominal pain, unspecified: Secondary | ICD-10-CM | POA: Diagnosis not present

## 2017-03-15 LAB — COMPREHENSIVE METABOLIC PANEL
ALT: 26 U/L (ref 17–63)
AST: 33 U/L (ref 15–41)
Albumin: 3.5 g/dL (ref 3.5–5.0)
Alkaline Phosphatase: 73 U/L (ref 38–126)
Anion gap: 8 (ref 5–15)
BUN: 10 mg/dL (ref 6–20)
CO2: 22 mmol/L (ref 22–32)
Calcium: 8.9 mg/dL (ref 8.9–10.3)
Chloride: 109 mmol/L (ref 101–111)
Creatinine, Ser: 1.09 mg/dL (ref 0.61–1.24)
GFR calc Af Amer: >60 mL/min (ref >60)
GFR calc non Af Amer: >60 mL/min (ref >60)
Glucose, Bld: 127 mg/dL — ABNORMAL HIGH (ref 65–99)
Potassium: 4 mmol/L (ref 3.5–5.1)
Sodium: 139 mmol/L (ref 135–145)
Total Bilirubin: 0.4 mg/dL (ref 0.3–1.2)
Total Protein: 6.8 g/dL (ref 6.5–8.1)

## 2017-03-15 LAB — CBC WITH DIFFERENTIAL/PLATELET
Basophils Absolute: 0 10*3/uL (ref 0.0–0.1)
Basophils Relative: 0 %
Eosinophils Absolute: 0.1 10*3/uL (ref 0.0–0.7)
Eosinophils Relative: 1 %
HCT: 35.1 % — ABNORMAL LOW (ref 39.0–52.0)
Hemoglobin: 12.3 g/dL — ABNORMAL LOW (ref 13.0–17.0)
Lymphocytes Relative: 26 %
Lymphs Abs: 2.8 10*3/uL (ref 0.7–4.0)
MCH: 29 pg (ref 26.0–34.0)
MCHC: 35 g/dL (ref 30.0–36.0)
MCV: 82.8 fL (ref 78.0–100.0)
Monocytes Absolute: 0.6 10*3/uL (ref 0.1–1.0)
Monocytes Relative: 6 %
Neutro Abs: 7.1 10*3/uL (ref 1.7–7.7)
Neutrophils Relative %: 67 %
Platelets: 246 10*3/uL (ref 150–400)
RBC: 4.24 MIL/uL (ref 4.22–5.81)
RDW: 12.8 % (ref 11.5–15.5)
WBC: 10.5 10*3/uL (ref 4.0–10.5)

## 2017-03-15 MED ORDER — KETOROLAC TROMETHAMINE 30 MG/ML IJ SOLN
30.0000 mg | Freq: Once | INTRAMUSCULAR | Status: AC
  Administered 2017-03-15: 30 mg via INTRAVENOUS
  Filled 2017-03-15: qty 1

## 2017-03-15 MED ORDER — ONDANSETRON HCL 4 MG PO TABS: 4.0000 mg | ORAL_TABLET | Freq: Three times a day (TID) | ORAL | 0 refills | Status: DC | PRN

## 2017-03-15 MED ORDER — DEXAMETHASONE SODIUM PHOSPHATE 10 MG/ML IJ SOLN
10.0000 mg | Freq: Once | INTRAMUSCULAR | Status: AC
  Administered 2017-03-15: 10 mg via INTRAVENOUS
  Filled 2017-03-15: qty 1

## 2017-03-15 MED ORDER — DIPHENHYDRAMINE HCL 50 MG/ML IJ SOLN
25.0000 mg | Freq: Once | INTRAMUSCULAR | Status: AC
  Administered 2017-03-15: 25 mg via INTRAVENOUS
  Filled 2017-03-15: qty 1

## 2017-03-15 MED ORDER — METOCLOPRAMIDE HCL 5 MG/ML IJ SOLN
10.0000 mg | Freq: Once | INTRAMUSCULAR | Status: AC
  Administered 2017-03-15: 10 mg via INTRAVENOUS
  Filled 2017-03-15: qty 2

## 2017-03-15 MED ORDER — SODIUM CHLORIDE 0.9 % IV BOLUS (SEPSIS)
1000.0000 mL | Freq: Once | INTRAVENOUS | Status: AC
  Administered 2017-03-15: 1000 mL via INTRAVENOUS

## 2017-03-15 NOTE — ED Provider Notes (Signed)
[facility] DEPT Provider Note   CSN: [id number] Arrival date & time: 03/15/17  0228   Time seen 05:20 AM  History   Chief Complaint Chief Complaint  Patient presents with  . Diarrhea    HPI Wayne Avila is a 26 y.o. male.  HPI  patient has a history of ulcerative colitis. 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 He is to start [facility] on Oct 9 at [facility]. He states today, October 6 he started having vomiting and has vomited twice and diarrhea over 10 times described as runny. He states he is having left upper abdominal pain that he describes as sharp. He states nothing he does makes the pain hurt more, nothing makes it feel better. He also states after he started having vomiting and diarrhea he started getting a headache that he describes as frontal and sharp. He does not have photophobia or phonophobia. He states he has had this headache before. He denies any rectal bleeding today.  PCP Raylene Everts, MD GI Dr Lizbeth Bark at [facility]   Past Medical History:  Diagnosis Date  . ADHD (attention deficit hyperactivity disorder)   . Anxiety   . Asthma   . Autoimmune hepatitis ([place])   . Bipolar 1 disorder ([place])   . C. difficile colitis 03/22/2016  . Clostridium difficile colitis   . Depression   . GERD (gastroesophageal reflux disease)   . HOH (hard of hearing)   . Mental developmental delay   . Seizures ([place])   . Ulcerative colitis [facility])     Patient Active Problem List   Diagnosis Date Noted  . ADHD 01/09/2017  . Environmental allergies 01/09/2017  . Class 1 obesity due to excess calories with body mass index (BMI) of 34.0 to 34.9 in adult 01/09/2017  . Autoimmune hepatitis ([place]) 01/02/2017  . Asthma in adult, mild intermittent, uncomplicated 15/83/0940  . Anemia 12/31/2016  . Hyperammonemia ([place]) 12/31/2016  . Elevated LFTs 12/31/2016  . Abnormal thyroid function test 03/24/2016  . Ulcerative colitis ([place]) 12/29/2015  . Bipolar 1 disorder, manic,  moderate ([place]) 11/09/2014  . Mental developmental delay 11/15/2012    Past Surgical History:  Procedure Laterality Date  . ADENOIDECTOMY    . BIOPSY N/A 12/07/2012   Procedure: GASTRIC BIOPSIES;  Surgeon: [name], MD;  Location: [facility] ORS;  Service: Endoscopy;  Laterality: N/A;  . BIOPSY N/A 08/30/2013   Procedure: BIOPSY;  Surgeon: [name], MD;  Location: [facility] ORS;  Service: Endoscopy;  Laterality: N/A;  right colon,transverse colon, left colon, rectal biopsies  . BIOPSY  11/20/2015   Procedure: BIOPSY;  Surgeon: [name], MD;  Location: [facility] END[facility] SU[facility];  Service: Endoscopy;;  ileal, right colon biopsy, left colon, rectum  . BIOPSY  04/18/2016   Procedure: BIOPSY;  Surgeon: [name], MD;  Location: [facility];  Service: Endoscopy;;  left descending colon biopsies  . COLONOSCOPY  11/20/2015   Dr. [name]: Severe erythema, edema, ulcers from the anal verge to 20 cm above the anal verge without mucosal sparing, remaining colon and terminal ileum appeared normal. Biopsies from the rectum revealed fulminant active chronic colitis consistent with IBD. Pathology from terminal ileum revealed intramucosal lymphoid aggregates. Remaining colon random biopsies with inactive chronic colitis  . COLONOSCOPY WITH PROPOFOL N/A 08/30/2013   HWK:GSUPJS mucosa in the terminal ileum/COLITIS/ MILD PROCTITIS. 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  Marland Kitchen  COLONOSCOPY WITH PROPOFOL N/A 11/20/2015   Dr. 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 ESOPHAGOGASTRODUODENOSCOPY  11/20/2015   Dr. [name]: Normal exam, stomach biopsied and duodenal biopsy.. Benign biopsies.  . ESOPHAGOGASTRODUODENOSCOPY (EGD) WITH PROPOFOL N/A 12/07/2012   SLF:The mucosa of the esophagus appeared normal Non-erosive gastritis (inflammation) was found in the gastric antrum; multiple  biopsies The duodenal mucosa showed no abnormalities in the bulb and second portion of the duodenum  . ESOPHAGOGASTRODUODENOSCOPY (EGD) WITH PROPOFOL N/A 11/20/2015   Dr. [name]: normal with normal biopsies, negative H.pylori   . ESOPHAGOGASTRODUODENOSCOPY (EGD) WITH PROPOFOL N/A 04/18/2016   Procedure: ESOPHAGOGASTRODUODENOSCOPY (EGD) WITH PROPOFOL;  Surgeon: [name], MD;  Location[facility];  Service: Endoscopy;  Laterality: N/A;  . FLEXIBLE SIGMOIDOSCOPY N/A 04/18/2016   Procedure: FLEXIBLE SIGMOIDOSCOPY;  Surgeon: [name], MD;  Location: [facility];  Service: Endoscopy;  Laterality: N/A;  . TONSILLECTOMY         Home Medications    Prior to Admission medications   Medication Sig Start Date End Date Taking? Authorizing Provider  albuterol (PROVENTIL HFA;VENTOLIN HFA) 108 (90 Base) MCG/ACT inhaler Inhale 2 puffs into the lungs every 4 (four) hours as needed for wheezing or shortness of breath (and/or cough).    [provider]  albuterol (PROVENTIL) (2.5 MG/3ML) 0.083% nebulizer solution Inhale 2.5 mg into the lungs every 6 (six) hours as needed for wheezing or shortness of breath.     [provider]  cholecalciferol (VITAMIN D) 1000 units tablet Take 1 tablet (1,000 Units total) by mouth daily. START AFTER VITAMIN D 50,000 U TABLETS ARE COMPLETE 04/08/16   Fields, Marga Melnick, MD  cloNIDine HCl (KAPVAY) 0.1 MG TB12 ER tablet Take 0.1 mg by mouth 2 (two) times daily.    [provider]  EPINEPHrine (EPIPEN 2-PAK) 0.3 mg/0.3 mL IJ SOAJ injection Inject 0.3 mg into the muscle once as needed (for severe allergic reaction).    [provider]  famotidine (PEPCID) 20 MG tablet Take 1 tablet (20 mg total) by mouth 2 (two) times daily. Patient taking differently: Take 20 mg by mouth daily.  02/21/17   Milton Ferguson, MD  lactulose (CHRONULAC) 10 GM/15ML solution Take 30 mLs (20 g total) by mouth daily as needed for mild constipation. 01/02/17    Kathie Dike, MD  loratadine (CLARITIN) 10 MG tablet Take 1 tablet (10 mg total) by mouth daily. 01/09/17   Raylene Everts, MD  metoCLOPramide (REGLAN) 10 MG tablet Take 1 tablet (10 mg total) by mouth every 6 (six) hours as needed for nausea (nausea/headache). 12/23/16   Daleen Bo, MD  montelukast (SINGULAIR) 10 MG tablet TAKE 1 TABLET BY MOUTH EVERY NIGHT AT BEDTIME 03/12/17   Raylene Everts, MD  ondansetron (ZOFRAN ODT) 4 MG disintegrating tablet Take 1 tablet (4 mg total) by mouth every 8 (eight) hours as needed for nausea. 12/13/16   [name], MD  ondansetron (ZOFRAN) 4 MG tablet Take 1 tablet (4 mg total) by mouth every 8 (eight) hours as needed. 03/15/17   Rolland Porter, MD  Oxcarbazepine (TRILEPTAL) 300 MG tablet Take one tablet by mouth twice daily/as directed per prescriber 01/06/17   [provider]  paliperidone (INVEGA) 3 MG 24 hr tablet Take 3 mg by mouth daily.    [provider]  predniSONE (DELTASONE) 10 MG tablet 4 po daily Patient taking differently: Take 2 mg by mouth daily.  01/02/17   Barney Drain  L, MD  sertraline (ZOLOFT) 100 MG tablet Take 1 tablet (100 mg total) by mouth daily. 11/13/14   [name], NP    Family History Family History  Problem Relation Age of Onset  . Asthma Mother   . Ulcers Mother   . Bipolar disorder Mother   . ADD / ADHD Father   . Diabetes Maternal Grandmother   . Hypertension Maternal Grandmother   . Sleep apnea Maternal Grandmother   . Heart disease Maternal Grandmother   . Diabetes Maternal Grandfather   . Hypertension Maternal Grandfather   . Sleep apnea Maternal Grandfather   . Heart disease Maternal Grandfather   . Cancer Maternal Grandfather        prostate  . Cancer Maternal Aunt        Leukemia  . Colon cancer Neg Hx   . Liver disease Neg Hx     Social History Social History  Substance Use Topics  . Smoking status: Former Smoker    Packs/day: 0.00    Years: 2.00    Quit date: 10/06/2012    . Smokeless tobacco: Never Used     Comment: Never smoked cigarettes  . Alcohol use No     Comment: denies usage  lives with his mother   Allergies   Nsaids; Pineapple; Remicade [infliximab]; Strawberry extract; Amoxicillin-pot clavulanate; Omeprazole; and Tomato   Review of Systems Review of Systems  All other systems reviewed and are negative.    Physical Exam Updated Vital Signs BP (!) 128/103   Pulse 79   Temp 99.2 F (37.3 C) (Oral)   Resp 20   Ht 5' 8"  (1.727 m)   Wt 105.2 kg (232 lb)   SpO2 96%   BMI 35.28 kg/m   Vital signs normal except diastolic hypertension   Physical Exam  Constitutional: He is oriented to person, place, and time. He appears well-developed and well-nourished.  Non-toxic appearance. He does not appear ill. No distress.  HENT:  Head: Normocephalic and atraumatic.  Right Ear: External ear normal.  Left Ear: External ear normal.  Nose: Nose normal. No mucosal edema or rhinorrhea.  Mouth/Throat: Mucous membranes are dry. No dental abscesses or uvula swelling.  Eyes: Pupils are equal, round, and reactive to light. Conjunctivae and EOM are normal.  Neck: Normal range of motion and full passive range of motion without pain. Neck supple.  Cardiovascular: Normal rate, regular rhythm and normal heart sounds.  Exam reveals no gallop and no friction rub.   No murmur heard. Pulmonary/Chest: Effort normal and breath sounds normal. No respiratory distress. He has no wheezes. He has no rhonchi. He has no rales. He exhibits no tenderness and no crepitus.  Abdominal: Soft. Normal appearance and bowel sounds are normal. He exhibits no distension. There is generalized tenderness. There is no rebound and no guarding.  Musculoskeletal: Normal range of motion. He exhibits no edema or tenderness.  Moves all extremities well.   Neurological: He is alert and oriented to person, place, and time. He has normal strength. No cranial nerve deficit.  Skin: Skin is  warm, dry and intact. No rash noted. No erythema. No pallor.  Psychiatric: He has a normal mood and affect. His speech is normal and behavior is normal. His mood appears not anxious.  Nursing note and vitals reviewed.    ED Treatments / Results  Labs (all labs ordered are listed, but only abnormal results are displayed) Labs Reviewed  COMPREHENSIVE METABOLIC PANEL - Abnormal; Notable for the following:  Result Value   Glucose, Bld 127 (*)    All other components within normal limits  CBC WITH DIFFERENTIAL/PLATELET - Abnormal; Notable for the following:    Hemoglobin 12.3 (*)    HCT 35.1 (*)    All other components within normal limits    EKG  EKG Interpretation None       Radiology No results found.  Procedures Procedures (including critical care time)  Colonoscopy at [facility] Sept 6, 2018 Impression: - Preparation of the colon was poor. - Mild inflammation was found from the rectum to the  descending colon. This was graded as Mayo Score 1 (mild  disease). Biopsied. - The transverse colon, ascending colon, cecum,  appendiceal orifice and ileocecal valve are normal.  Surgical Pathology Report Collected Feb 12, 2017 Final Diagnosis A: Colon, biopsy  - Mildly active chronic colitis  - No granulomas, viral cytopathic effect, or dysplasia identified       Medications Ordered in ED Medications  sodium chloride 0.9 % bolus 1,000 mL (1,000 mLs Intravenous New Bag/Given 03/15/17 0551)  sodium chloride 0.9 % bolus 1,000 mL (1,000 mLs Intravenous New Bag/Given 03/15/17 0551)  metoCLOPramide (REGLAN) injection 10 mg (10 mg Intravenous Given 03/15/17 0556)  diphenhydrAMINE (BENADRYL) injection 25 mg (25 mg Intravenous Given 03/15/17 0558)  ketorolac (TORADOL) 30 MG/ML injection 30 mg (30 mg Intravenous Given 03/15/17 0553)  dexamethasone (DECADRON) injection 10 mg (10 mg Intravenous Given 03/15/17 0554)     Initial Impression / Assessment and Plan / ED Course  I have reviewed  the triage vital signs and the nursing notes.  Pertinent labs & imaging results that were available during my care of the patient were reviewed by me and considered in my medical decision making (see chart for details).    Patient was given IV fluids, he was given migraine cocktail including Reglan, Benadryl, Toradol, and Decadron for his headache and hopefully it would also help with his nausea and abdominal discomfort.  Recheck at 8:12 AM patient states he feels much improved and he appears to be feeling better. He states his headache is gone, his nausea and vomiting are gone, he has had no more diarrhea, and he is having good urinary output. He states he feels ready to be discharged home.  Final Clinical Impressions(s) / ED Diagnoses   Final diagnoses:  Nausea vomiting and diarrhea  Dehydration  Acute nonintractable headache, unspecified headache type    New Prescriptions New Prescriptions   ONDANSETRON (ZOFRAN) 4 MG TABLET    Take 1 tablet (4 mg total) by mouth every 8 (eight) hours as needed.    Plan discharge  Rolland Porter, MD, Barbette Or, MD 03/15/17 [phone]

## 2017-03-15 NOTE — ED Triage Notes (Signed)
Pt c/o diarrhea, headache, abd pain, chills that started yesterday, nausea/vomiting that started today,

## 2017-03-15 NOTE — Discharge Instructions (Signed)
Drink plenty of fluids so you don't get dehydrated again.  Drink plenty of fluids (clear liquids) then start a bland diet later this morning such as toast, crackers, jello, [employer] chicken noodle soup. Use the zofran for nausea or vomiting. Take imodium OTC for diarrhea. Avoid milk products until the diarrhea is gone.

## 2017-03-17 ENCOUNTER — Ambulatory Visit
Admission: RE | Admit: 2017-03-17 | Discharge: 2017-03-17 | Disposition: A | Discharge status: Home / Self Care | Payer: MEDICAID

## 2017-03-17 DIAGNOSIS — K51811 Other ulcerative colitis with rectal bleeding: Principal | ICD-10-CM

## 2017-03-19 ENCOUNTER — Encounter (HOSPITAL_COMMUNITY): Payer: Self-pay | Admitting: Emergency Medicine

## 2017-03-19 ENCOUNTER — Emergency Department (HOSPITAL_COMMUNITY)
Admission: EM | Admit: 2017-03-19 | Discharge: 2017-03-19 | Disposition: A | Discharge status: Home / Self Care | Payer: Medicaid Other | Attending: Emergency Medicine | Admitting: Emergency Medicine

## 2017-03-19 ENCOUNTER — Emergency Department (HOSPITAL_COMMUNITY): Payer: Medicaid Other

## 2017-03-19 DIAGNOSIS — Z8619 Personal history of other infectious and parasitic diseases: Secondary | ICD-10-CM | POA: Insufficient documentation

## 2017-03-19 DIAGNOSIS — F909 Attention-deficit hyperactivity disorder, unspecified type: Secondary | ICD-10-CM | POA: Insufficient documentation

## 2017-03-19 DIAGNOSIS — Z87891 Personal history of nicotine dependence: Secondary | ICD-10-CM | POA: Insufficient documentation

## 2017-03-19 DIAGNOSIS — K219 Gastro-esophageal reflux disease without esophagitis: Secondary | ICD-10-CM | POA: Insufficient documentation

## 2017-03-19 DIAGNOSIS — R197 Diarrhea, unspecified: Secondary | ICD-10-CM | POA: Diagnosis not present

## 2017-03-19 DIAGNOSIS — R109 Unspecified abdominal pain: Secondary | ICD-10-CM | POA: Diagnosis not present

## 2017-03-19 DIAGNOSIS — R110 Nausea: Secondary | ICD-10-CM | POA: Insufficient documentation

## 2017-03-19 DIAGNOSIS — R1010 Upper abdominal pain, unspecified: Secondary | ICD-10-CM | POA: Diagnosis present

## 2017-03-19 DIAGNOSIS — Z79899 Other long term (current) drug therapy: Secondary | ICD-10-CM | POA: Insufficient documentation

## 2017-03-19 DIAGNOSIS — J4520 Mild intermittent asthma, uncomplicated: Secondary | ICD-10-CM | POA: Insufficient documentation

## 2017-03-19 LAB — COMPREHENSIVE METABOLIC PANEL
ALT: 25 U/L (ref 17–63)
AST: 32 U/L (ref 15–41)
Albumin: 3.4 g/dL — ABNORMAL LOW (ref 3.5–5.0)
Alkaline Phosphatase: 77 U/L (ref 38–126)
Anion gap: 11 (ref 5–15)
BUN: 10 mg/dL (ref 6–20)
CO2: 19 mmol/L — ABNORMAL LOW (ref 22–32)
Calcium: 8.5 mg/dL — ABNORMAL LOW (ref 8.9–10.3)
Chloride: 105 mmol/L (ref 101–111)
Creatinine, Ser: 0.98 mg/dL (ref 0.61–1.24)
GFR calc Af Amer: >60 mL/min (ref >60)
GFR calc non Af Amer: >60 mL/min (ref >60)
Glucose, Bld: 173 mg/dL — ABNORMAL HIGH (ref 65–99)
Potassium: 3 mmol/L — ABNORMAL LOW (ref 3.5–5.1)
Sodium: 135 mmol/L (ref 135–145)
Total Bilirubin: 0.2 mg/dL — ABNORMAL LOW (ref 0.3–1.2)
Total Protein: 6.7 g/dL (ref 6.5–8.1)

## 2017-03-19 LAB — CBC WITH DIFFERENTIAL/PLATELET
Basophils Absolute: 0 10*3/uL (ref 0.0–0.1)
Basophils Relative: 0 %
Eosinophils Absolute: 0.1 10*3/uL (ref 0.0–0.7)
Eosinophils Relative: 1 %
HCT: 35 % — ABNORMAL LOW (ref 39.0–52.0)
Hemoglobin: 12.1 g/dL — ABNORMAL LOW (ref 13.0–17.0)
Lymphocytes Relative: 30 %
Lymphs Abs: 3.3 10*3/uL (ref 0.7–4.0)
MCH: 28.8 pg (ref 26.0–34.0)
MCHC: 34.6 g/dL (ref 30.0–36.0)
MCV: 83.3 fL (ref 78.0–100.0)
Monocytes Absolute: 0.6 10*3/uL (ref 0.1–1.0)
Monocytes Relative: 6 %
Neutro Abs: 7 10*3/uL (ref 1.7–7.7)
Neutrophils Relative %: 63 %
Platelets: 248 10*3/uL (ref 150–400)
RBC: 4.2 MIL/uL — ABNORMAL LOW (ref 4.22–5.81)
RDW: 13.1 % (ref 11.5–15.5)
WBC: 11 10*3/uL — ABNORMAL HIGH (ref 4.0–10.5)

## 2017-03-19 LAB — LIPASE, BLOOD: Lipase: 39 U/L (ref 11–51)

## 2017-03-19 MED ORDER — SODIUM CHLORIDE 0.9 % IV BOLUS (SEPSIS)
1000.0000 mL | Freq: Once | INTRAVENOUS | Status: AC
  Administered 2017-03-19: 1000 mL via INTRAVENOUS

## 2017-03-19 MED ORDER — DICYCLOMINE HCL 10 MG PO CAPS
10.0000 mg | ORAL_CAPSULE | Freq: Once | ORAL | Status: AC
  Administered 2017-03-19: 10 mg via ORAL
  Filled 2017-03-19: qty 1

## 2017-03-19 MED ORDER — ONDANSETRON HCL 4 MG/2ML IJ SOLN
4.0000 mg | Freq: Once | INTRAMUSCULAR | Status: AC
  Administered 2017-03-19: 4 mg via INTRAVENOUS
  Filled 2017-03-19: qty 2

## 2017-03-19 MED ORDER — LOPERAMIDE HCL 2 MG PO CAPS: 2.0000 mg | ORAL_CAPSULE | Freq: Four times a day (QID) | ORAL | 0 refills | Status: DC | PRN

## 2017-03-19 MED ORDER — KETOROLAC TROMETHAMINE 30 MG/ML IJ SOLN
30.0000 mg | Freq: Once | INTRAMUSCULAR | Status: AC
  Administered 2017-03-19: 30 mg via INTRAVENOUS
  Filled 2017-03-19: qty 1

## 2017-03-19 MED ORDER — LOPERAMIDE HCL 2 MG PO CAPS
4.0000 mg | ORAL_CAPSULE | Freq: Once | ORAL | Status: AC
  Administered 2017-03-19: 4 mg via ORAL
  Filled 2017-03-19: qty 2

## 2017-03-19 NOTE — Discharge Instructions (Signed)
You were seen today for abdominal cramping and diarrhea. Your workup is reassuring.  Take Imodium as needed for diarrhea. Follow-up closely with your GI doctor.

## 2017-03-19 NOTE — ED Provider Notes (Signed)
[facility] DEPT Provider Note   CSN: [id number] Arrival date & time: 03/19/17  0345     History   Chief Complaint Chief Complaint  Patient presents with  . Abdominal Pain    HPI Wayne Avila is a 26 y.o. male.  HPI  This is a 26 year old male with a history of ulcerative colitis who presents with abdominal cramping and diarrhea. Patient reports recent history of autoimmune hepatitis. Remicade was stopped and patient was started on Entyvio on October 9.  He reports multiple episodes of non-bloody diarrhea over the last week 4 hours. He reports crampy diffuse abdominal pain that started prior to going to bed. He rates his pain a 10 out of 10. He has not taken anything for pain. He has had similar symptoms recently. No fevers or sick contacts noted.  Past Medical History:  Diagnosis Date  . ADHD (attention deficit hyperactivity disorder)   . Anxiety   . Asthma   . Autoimmune hepatitis ([place])   . Bipolar 1 disorder ([place])   . C. difficile colitis 03/22/2016  . Clostridium difficile colitis   . Depression   . GERD (gastroesophageal reflux disease)   . HOH (hard of hearing)   . Mental developmental delay   . Seizures ([place])   . Ulcerative colitis [facility])     Patient Active Problem List   Diagnosis Date Noted  . ADHD 01/09/2017  . Environmental allergies 01/09/2017  . Class 1 obesity due to excess calories with body mass index (BMI) of 34.0 to 34.9 in adult 01/09/2017  . Autoimmune hepatitis ([place]) 01/02/2017  . Asthma in adult, mild intermittent, uncomplicated 26/94/8546  . Anemia 12/31/2016  . Hyperammonemia ([place]) 12/31/2016  . Elevated LFTs 12/31/2016  . Abnormal thyroid function test 03/24/2016  . Ulcerative colitis ([place]) 12/29/2015  . Bipolar 1 disorder, manic, moderate ([place]) 11/09/2014  . Mental developmental delay 11/15/2012    Past Surgical History:  Procedure Laterality Date  . ADENOIDECTOMY    . BIOPSY N/A 12/07/2012   Procedure: GASTRIC BIOPSIES;  Surgeon:  [name], MD;  Location: [facility] ORS;  Service: Endoscopy;  Laterality: N/A;  . BIOPSY N/A 08/30/2013   Procedure: BIOPSY;  Surgeon: [name], MD;  Location: [facility] ORS;  Service: Endoscopy;  Laterality: N/A;  right colon,transverse colon, left colon, rectal biopsies  . BIOPSY  11/20/2015   Procedure: BIOPSY;  Surgeon: [name], MD;  Location: [facility] ENDO SUITE;  Service: Endoscopy;;  ileal, right colon biopsy, left colon, rectum  . BIOPSY  04/18/2016   Procedure: BIOPSY;  Surgeon: [name], MD;  Location: [facility] ENDO SUITE;  Service: Endoscopy;;  left descending colon biopsies  . COLONOSCOPY  11/20/2015   Dr. [name]: Severe erythema, edema, ulcers from the anal verge to 20 cm above the anal verge without mucosal sparing, remaining colon and terminal ileum appeared normal. Biopsies from the rectum revealed fulminant active chronic colitis consistent with IBD. Pathology from terminal ileum revealed intramucosal lymphoid aggregates. Remaining colon random biopsies with inactive chronic colitis  . COLONOSCOPY WITH PROPOFOL N/A 08/30/2013   EVO:[id number] mucosa in the terminal ileum/COLITIS/ MILD PROCTITIS. 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  Marland Kitchen COLONOSCOPY WITH PROPOFOL N/A 11/20/2015   Dr. 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 ESOPHAGOGASTRODUODENOSCOPY  11/20/2015   Dr. [name]: Normal exam, stomach biopsied and duodenal biopsy.. Benign biopsies.  . ESOPHAGOGASTRODUODENOSCOPY (EGD) WITH  PROPOFOL N/A 12/07/2012   SLF:The mucosa of the esophagus appeared normal Non-erosive gastritis (inflammation) was found in the gastric antrum; multiple biopsies The duodenal mucosa showed no abnormalities in the bulb and second portion of the duodenum  . ESOPHAGOGASTRODUODENOSCOPY (EGD) WITH PROPOFOL N/A 11/20/2015   Dr. [name]: normal with normal biopsies,  negative H.pylori   . ESOPHAGOGASTRODUODENOSCOPY (EGD) WITH PROPOFOL N/A 04/18/2016   Procedure: ESOPHAGOGASTRODUODENOSCOPY (EGD) WITH PROPOFOL;  Surgeon: [name], MD;  Location[facility];  Service: Endoscopy;  Laterality: N/A;  . FLEXIBLE SIGMOIDOSCOPY N/A 04/18/2016   Procedure: FLEXIBLE SIGMOIDOSCOPY;  Surgeon: [name], MD;  Location: [facility] END[facility];  Service: Endoscopy;  Laterality: N/A;  . TONSILLECTOMY         Home Medications    Prior to Admission medications   Medication Sig Start Date End Date Taking? Authorizing Provider  albuterol (PROVENTIL HFA;VENTOLIN HFA) 108 (90 Base) MCG/ACT inhaler Inhale 2 puffs into the lungs every 4 (four) hours as needed for wheezing or shortness of breath (and/or cough).    [provider]  albuterol (PROVENTIL) (2.5 MG/3ML) 0.083% nebulizer solution Inhale 2.5 mg into the lungs every 6 (six) hours as needed for wheezing or shortness of breath.     [provider]  cholecalciferol (VITAMIN D) 1000 units tablet Take 1 tablet (1,000 Units total) by mouth daily. START AFTER VITAMIN D 50,000 U TABLETS ARE COMPLETE 04/08/16   Fields, Marga Melnick, MD  cloNIDine HCl (KAPVAY) 0.1 MG TB12 ER tablet Take 0.1 mg by mouth 2 (two) times daily.    [provider]  EPINEPHrine (EPIPEN 2-PAK) 0.3 mg/0.3 mL IJ SOAJ injection Inject 0.3 mg into the muscle once as needed (for severe allergic reaction).    [provider]  famotidine (PEPCID) 20 MG tablet Take 1 tablet (20 mg total) by mouth 2 (two) times daily. Patient taking differently: Take 20 mg by mouth daily.  02/21/17   Milton Ferguson, MD  lactulose (CHRONULAC) 10 GM/15ML solution Take 30 mLs (20 g total) by mouth daily as needed for mild constipation. 01/02/17   Kathie Dike, MD  loperamide (IMODIUM) 2 MG capsule Take 1 capsule (2 mg total) by mouth 4 (four) times daily as needed for diarrhea or loose stools. 03/19/17   Horton, Barbette Hair, MD  loratadine  (CLARITIN) 10 MG tablet Take 1 tablet (10 mg total) by mouth daily. 01/09/17   Raylene Everts, MD  metoCLOPramide (REGLAN) 10 MG tablet Take 1 tablet (10 mg total) by mouth every 6 (six) hours as needed for nausea (nausea/headache). 12/23/16   Daleen Bo, MD  montelukast (SINGULAIR) 10 MG tablet TAKE 1 TABLET BY MOUTH EVERY NIGHT AT BEDTIME 03/12/17   Raylene Everts, MD  ondansetron (ZOFRAN ODT) 4 MG disintegrating tablet Take 1 tablet (4 mg total) by mouth every 8 (eight) hours as needed for nausea. 12/13/16   [name], MD  ondansetron (ZOFRAN) 4 MG tablet Take 1 tablet (4 mg total) by mouth every 8 (eight) hours as needed. 03/15/17   Rolland Porter, MD  Oxcarbazepine (TRILEPTAL) 300 MG tablet Take one tablet by mouth twice daily/as directed per prescriber 01/06/17   [provider]  paliperidone (INVEGA) 3 MG 24 hr tablet Take 3 mg by mouth daily.    [provider]  predniSONE (DELTASONE) 10 MG tablet 4 po daily Patient taking differently: Take 2 mg by mouth daily.  01/02/17   Fields, Marga Melnick, MD  sertraline (ZOLOFT) 100 MG tablet Take  1 tablet (100 mg total) by mouth daily. 11/13/14   [name], NP    Family History Family History  Problem Relation Age of Onset  . Asthma Mother   . Ulcers Mother   . Bipolar disorder Mother   . ADD / ADHD Father   . Diabetes Maternal Grandmother   . Hypertension Maternal Grandmother   . Sleep apnea Maternal Grandmother   . Heart disease Maternal Grandmother   . Diabetes Maternal Grandfather   . Hypertension Maternal Grandfather   . Sleep apnea Maternal Grandfather   . Heart disease Maternal Grandfather   . Cancer Maternal Grandfather        prostate  . Cancer Maternal Aunt        Leukemia  . Colon cancer Neg Hx   . Liver disease Neg Hx     Social History Social History  Substance Use Topics  . Smoking status: Former Smoker    Packs/day: 0.00    Years: 2.00    Quit date: 10/06/2012  . Smokeless tobacco: Never Used       Comment: Never smoked cigarettes  . Alcohol use No     Comment: denies usage     Allergies   Nsaids; Pineapple; Remicade [infliximab]; Strawberry extract; Amoxicillin-pot clavulanate; Omeprazole; and Tomato   Review of Systems Review of Systems  Constitutional: Negative for fever.  Respiratory: Negative for shortness of breath.   Cardiovascular: Negative for chest pain.  Gastrointestinal: Positive for abdominal pain and nausea. Negative for diarrhea and vomiting.  Genitourinary: Negative for dysuria.  All other systems reviewed and are negative.    Physical Exam Updated Vital Signs BP (!) 144/83   Pulse 94   Temp 98.6 F (37 C)   Resp 20   Ht 5' 8"  (1.727 m)   Wt 105.2 kg (232 lb)   SpO2 99%   BMI 35.28 kg/m   Physical Exam  Constitutional: He is oriented to person, place, and time. No distress.  HENT:  Head: Normocephalic and atraumatic.  Cardiovascular: Normal rate, regular rhythm and normal heart sounds.   No murmur heard. Pulmonary/Chest: Effort normal and breath sounds normal. No respiratory distress. He has no wheezes.  Abdominal: Soft. Bowel sounds are normal. There is no tenderness. There is no rebound and no guarding.  Diffuse tenderness to palpation without rebound or guarding  Neurological: He is alert and oriented to person, place, and time.  Skin: Skin is warm and dry.  Psychiatric: He has a normal mood and affect.  Nursing note and vitals reviewed.    ED Treatments / Results  Labs (all labs ordered are listed, but only abnormal results are displayed) Labs Reviewed  CBC WITH DIFFERENTIAL/PLATELET - Abnormal; Notable for the following:       Result Value   WBC 11.0 (*)    RBC 4.20 (*)    Hemoglobin 12.1 (*)    HCT 35.0 (*)    All other components within normal limits  COMPREHENSIVE METABOLIC PANEL - Abnormal; Notable for the following:    Potassium 3.0 (*)    CO2 19 (*)    Glucose, Bld 173 (*)    Calcium 8.5 (*)    Albumin 3.4 (*)     Total Bilirubin 0.2 (*)    All other components within normal limits  LIPASE, BLOOD    EKG  EKG Interpretation None       Radiology Dg Abdomen Acute W/chest  Result Date: 03/19/2017 CLINICAL DATA:  Acute onset of generalized  abdominal pain and diarrhea. Initial encounter. EXAM: DG ABDOMEN ACUTE W/ 1V CHEST COMPARISON:  Chest radiograph performed 12/23/2016, and CT of the abdomen and pelvis performed 12/12/2016 FINDINGS: The lungs are well-aerated and clear. There is no evidence of focal opacification, pleural effusion or pneumothorax. The cardiomediastinal silhouette is within normal limits. The visualized bowel gas pattern is unremarkable. Scattered stool and air are seen within the colon; there is no evidence of small bowel dilatation to suggest obstruction. No free intra-abdominal air is identified on the provided upright view. No acute osseous abnormalities are seen; the sacroiliac joints are unremarkable in appearance. IMPRESSION: 1. Unremarkable bowel gas pattern; no free intra-abdominal air seen. Small amount of stool noted in the colon. 2. No acute cardiopulmonary process seen. Electronically Signed   By: Garald Balding M.D.   On: 03/19/2017 04:44    Procedures Procedures (including critical care time)  Medications Ordered in ED Medications  dicyclomine (BENTYL) capsule 10 mg (10 mg Oral Given 03/19/17 0418)  loperamide (IMODIUM) capsule 4 mg (4 mg Oral Given 03/19/17 0418)  sodium chloride 0.9 % bolus 1,000 mL (1,000 mLs Intravenous New Bag/Given 03/19/17 0418)  ondansetron (ZOFRAN) injection 4 mg (4 mg Intravenous Given 03/19/17 0417)  ketorolac (TORADOL) 30 MG/ML injection 30 mg (30 mg Intravenous Given 03/19/17 0510)     Initial Impression / Assessment and Plan / ED Course  I have reviewed the triage vital signs and the nursing notes.  Pertinent labs & imaging results that were available during my care of the patient were reviewed by me and considered in my medical  decision making (see chart for details).     Patient presents with crampy abdominal pain and diarrhea. Uncomfortable appearing but nontoxic. Diffuse tenderness on exam without signs of peritonitis. Basic labwork obtained. Vital signs are reassuring. Patient was given fluids, Bentyl, Imodium, Toradol.  Acute abdominal series without evidence of obstruction. Lab work appears to be the patient's baseline. Patient had significant improvement of symptoms with treatment. He was seen and evaluated several days ago for the same and had good response to similar treatment. Do not feel this time he needs a CT scan for further imaging. He has no focal or localized abdominal tenderness. Recommend follow-up with his gastroenterologist.  After history, exam, and medical workup I feel the patient has been appropriately medically screened and is safe for discharge home. Pertinent diagnoses were discussed with the patient. Patient was given return precautions.   Final Clinical Impressions(s) / ED Diagnoses   Final diagnoses:  Diarrhea, unspecified type  Abdominal cramping    New Prescriptions New Prescriptions   LOPERAMIDE (IMODIUM) 2 MG CAPSULE    Take 1 capsule (2 mg total) by mouth 4 (four) times daily as needed for diarrhea or loose stools.     Merryl Hacker, MD 03/19/17 [phone]

## 2017-03-19 NOTE — ED Triage Notes (Signed)
Pt c/o generalized abd pain with diarrhea since yesterday.

## 2017-03-31 ENCOUNTER — Ambulatory Visit
Admission: RE | Admit: 2017-03-31 | Discharge: 2017-03-31 | Disposition: A | Discharge status: Home / Self Care | Payer: MEDICAID

## 2017-03-31 DIAGNOSIS — K51811 Other ulcerative colitis with rectal bleeding: Principal | ICD-10-CM

## 2017-04-09 ENCOUNTER — Ambulatory Visit
Admission: RE | Admit: 2017-04-09 | Discharge: 2017-04-09 | Disposition: A | Discharge status: Home / Self Care | Payer: MEDICAID | Attending: Internal Medicine | Admitting: Internal Medicine

## 2017-04-09 DIAGNOSIS — K51811 Other ulcerative colitis with rectal bleeding: Principal | ICD-10-CM

## 2017-04-21 ENCOUNTER — Emergency Department (HOSPITAL_COMMUNITY): Payer: Medicaid Other

## 2017-04-21 ENCOUNTER — Encounter (HOSPITAL_COMMUNITY): Payer: Self-pay | Admitting: *Deleted

## 2017-04-21 ENCOUNTER — Other Ambulatory Visit: Payer: Self-pay

## 2017-04-21 ENCOUNTER — Emergency Department (HOSPITAL_COMMUNITY)
Admission: EM | Admit: 2017-04-21 | Discharge: 2017-04-21 | Disposition: A | Discharge status: Home / Self Care | Payer: Medicaid Other | Attending: Emergency Medicine | Admitting: Emergency Medicine

## 2017-04-21 ENCOUNTER — Encounter: Payer: Self-pay | Admitting: Gastroenterology

## 2017-04-21 DIAGNOSIS — J45909 Unspecified asthma, uncomplicated: Secondary | ICD-10-CM | POA: Insufficient documentation

## 2017-04-21 DIAGNOSIS — R05 Cough: Secondary | ICD-10-CM | POA: Diagnosis present

## 2017-04-21 DIAGNOSIS — J4541 Moderate persistent asthma with (acute) exacerbation: Secondary | ICD-10-CM | POA: Diagnosis not present

## 2017-04-21 DIAGNOSIS — J181 Lobar pneumonia, unspecified organism: Secondary | ICD-10-CM | POA: Insufficient documentation

## 2017-04-21 DIAGNOSIS — Z79899 Other long term (current) drug therapy: Secondary | ICD-10-CM | POA: Diagnosis not present

## 2017-04-21 DIAGNOSIS — Z87891 Personal history of nicotine dependence: Secondary | ICD-10-CM | POA: Insufficient documentation

## 2017-04-21 DIAGNOSIS — J189 Pneumonia, unspecified organism: Secondary | ICD-10-CM

## 2017-04-21 DIAGNOSIS — F909 Attention-deficit hyperactivity disorder, unspecified type: Secondary | ICD-10-CM | POA: Insufficient documentation

## 2017-04-21 MED ORDER — PREDNISONE 20 MG PO TABS: ORAL_TABLET | ORAL | 0 refills | Status: DC

## 2017-04-21 MED ORDER — LIDOCAINE HCL (PF) 1 % IJ SOLN
INTRAMUSCULAR | Status: AC
  Filled 2017-04-21: qty 2

## 2017-04-21 MED ORDER — IPRATROPIUM-ALBUTEROL 0.5-2.5 (3) MG/3ML IN SOLN
3.0000 mL | Freq: Once | RESPIRATORY_TRACT | Status: AC
  Administered 2017-04-21: 3 mL via RESPIRATORY_TRACT
  Filled 2017-04-21: qty 3

## 2017-04-21 MED ORDER — CEFTRIAXONE SODIUM 1 G IJ SOLR
1.0000 g | Freq: Once | INTRAMUSCULAR | Status: AC
  Administered 2017-04-21: 1 g via INTRAMUSCULAR
  Filled 2017-04-21: qty 10

## 2017-04-21 MED ORDER — ALBUTEROL SULFATE (2.5 MG/3ML) 0.083% IN NEBU
2.5000 mg | INHALATION_SOLUTION | Freq: Once | RESPIRATORY_TRACT | Status: AC
  Administered 2017-04-21: 2.5 mg via RESPIRATORY_TRACT
  Filled 2017-04-21: qty 3

## 2017-04-21 MED ORDER — AZITHROMYCIN 250 MG PO TABS
500.0000 mg | ORAL_TABLET | Freq: Once | ORAL | Status: AC
  Administered 2017-04-21: 500 mg via ORAL
  Filled 2017-04-21: qty 2

## 2017-04-21 MED ORDER — PREDNISONE 10 MG PO TABS
60.0000 mg | ORAL_TABLET | Freq: Once | ORAL | Status: AC
  Administered 2017-04-21: 60 mg via ORAL
  Filled 2017-04-21: qty 1

## 2017-04-21 MED ORDER — AZITHROMYCIN 250 MG PO TABS: 250.0000 mg | ORAL_TABLET | Freq: Every day | ORAL | 0 refills | Status: DC

## 2017-04-21 MED ORDER — CEFDINIR 300 MG PO CAPS: 300.0000 mg | ORAL_CAPSULE | Freq: Two times a day (BID) | ORAL | 0 refills | Status: DC

## 2017-04-21 NOTE — ED Provider Notes (Signed)
[facility] EMERGENCY DEPARTMENT Provider Note   CSN: [id number] Arrival date & time: 04/21/17  0043     History   Chief Complaint Chief Complaint  Patient presents with  . Cough    HPI Wayne Avila is a 26 y.o. male.  Patient presents to the ER for evaluation of cough and chest congestion.  Symptoms have been present for 2 days.  He has not noticed any fever.  Patient does have a history of asthma, uses inhalers and nebulizers.  He has been having increased tightness in his chest and difficulty breathing since the cough began.  No nasal congestion, runny nose, sore throat, vomiting, diarrhea.      Past Medical History:  Diagnosis Date  . ADHD (attention deficit hyperactivity disorder)   . Anxiety   . Asthma   . Autoimmune hepatitis ([place])   . Bipolar 1 disorder ([place])   . C. difficile colitis 03/22/2016  . Clostridium difficile colitis   . Depression   . GERD (gastroesophageal reflux disease)   . HOH (hard of hearing)   . Mental developmental delay   . Seizures ([place])   . Ulcerative colitis [facility])     Patient Active Problem List   Diagnosis Date Noted  . ADHD 01/09/2017  . Environmental allergies 01/09/2017  . Class 1 obesity due to excess calories with body mass index (BMI) of 34.0 to 34.9 in adult 01/09/2017  . Autoimmune hepatitis ([place]) 01/02/2017  . Asthma in adult, mild intermittent, uncomplicated 46/65/9935  . Anemia 12/31/2016  . Hyperammonemia ([place]) 12/31/2016  . Elevated LFTs 12/31/2016  . Abnormal thyroid function test 03/24/2016  . Ulcerative colitis ([place]) 12/29/2015  . Bipolar 1 disorder, manic, moderate ([place]) 11/09/2014  . Mental developmental delay 11/15/2012    Past Surgical History:  Procedure Laterality Date  . ADENOIDECTOMY    . COLONOSCOPY  11/20/2015   Dr. [name]: Severe erythema, edema, ulcers from the anal verge to 20 cm above the anal verge without mucosal sparing, remaining colon and terminal ileum appeared normal. Biopsies from the  rectum revealed fulminant active chronic colitis consistent with IBD. Pathology from terminal ileum revealed intramucosal lymphoid aggregates. Remaining colon random biopsies with inactive chronic colitis  . ESOPHAGOGASTRODUODENOSCOPY  11/20/2015   Dr. [name]: Normal exam, stomach biopsied and duodenal biopsy.. Benign biopsies.  . TONSILLECTOMY         Home Medications    Prior to Admission medications   Medication Sig Start Date End Date Taking? Authorizing Provider  albuterol (PROVENTIL HFA;VENTOLIN HFA) 108 (90 Base) MCG/ACT inhaler Inhale 2 puffs into the lungs every 4 (four) hours as needed for wheezing or shortness of breath (and/or cough).   Yes [provider]  albuterol (PROVENTIL) (2.5 MG/3ML) 0.083% nebulizer solution Inhale 2.5 mg into the lungs every 6 (six) hours as needed for wheezing or shortness of breath.    Yes [provider]  cholecalciferol (VITAMIN D) 1000 units tablet Take 1 tablet (1,000 Units total) by mouth daily. START AFTER VITAMIN D 50,000 U TABLETS ARE COMPLETE 04/08/16  Yes Fields, [name], MD  cloNIDine HCl (KAPVAY) 0.1 MG TB12 ER tablet Take 0.1 mg by mouth 2 (two) times daily.   Yes [provider]  EPINEPHrine (EPIPEN 2-PAK) 0.3 mg/0.3 mL IJ SOAJ injection Inject 0.3 mg into the muscle once as needed (for severe allergic reaction).   Yes [provider]  famotidine (PEPCID) 20 MG tablet Take 1 tablet (20 mg total) by mouth 2 (two) times daily. Patient  taking differently: Take 20 mg by mouth daily.  02/21/17  Yes Milton Ferguson, MD  lactulose (CHRONULAC) 10 GM/15ML solution Take 30 mLs (20 g total) by mouth daily as needed for mild constipation. 01/02/17  Yes Kathie Dike, MD  loperamide (IMODIUM) 2 MG capsule Take 1 capsule (2 mg total) by mouth 4 (four) times daily as needed for diarrhea or loose stools. 03/19/17  Yes Horton, Barbette Hair, MD  loratadine (CLARITIN) 10 MG tablet Take 1 tablet (10 mg total) by mouth daily.  01/09/17  Yes Raylene Everts, MD  metoCLOPramide (REGLAN) 10 MG tablet Take 1 tablet (10 mg total) by mouth every 6 (six) hours as needed for nausea (nausea/headache). 12/23/16  Yes Daleen Bo, MD  montelukast (SINGULAIR) 10 MG tablet TAKE 1 TABLET BY MOUTH EVERY NIGHT AT BEDTIME 03/12/17  Yes Raylene Everts, MD  ondansetron (ZOFRAN ODT) 4 MG disintegrating tablet Take 1 tablet (4 mg total) by mouth every 8 (eight) hours as needed for nausea. 12/13/16  Yes [name], MD  ondansetron (ZOFRAN) 4 MG tablet Take 1 tablet (4 mg total) by mouth every 8 (eight) hours as needed. 03/15/17  Yes Rolland Porter, MD  Oxcarbazepine (TRILEPTAL) 300 MG tablet Take one tablet by mouth twice daily/as directed per prescriber 01/06/17  Yes [provider]  paliperidone (INVEGA) 3 MG 24 hr tablet Take 3 mg by mouth daily.   Yes [provider]  predniSONE (DELTASONE) 10 MG tablet 4 po daily Patient taking differently: Take 2 mg by mouth daily.  01/02/17  Yes Fields, [name], MD  sertraline (ZOLOFT) 100 MG tablet Take 1 tablet (100 mg total) by mouth daily. 11/13/14  Yes [name], NP  azithromycin (ZITHROMAX) 250 MG tablet Take 1 tablet (250 mg total) daily by mouth. 04/21/17   Elianna Windom, Gwenyth Allegra, MD  cefdinir (OMNICEF) 300 MG capsule Take 1 capsule (300 mg total) 2 (two) times daily by mouth. 04/21/17   Sayuri Rhames, Gwenyth Allegra, MD  predniSONE (DELTASONE) 20 MG tablet 3 tabs po daily x 3 days, then 2 tabs x 3 days, then 1.5 tabs x 3 days, then 1 tab x 3 days, then 0.5 tabs x 3 days 04/21/17   Orpah Greek, MD    Family History Family History  Problem Relation Age of Onset  . Asthma Mother   . Ulcers Mother   . Bipolar disorder Mother   . ADD / ADHD Father   . Diabetes Maternal Grandmother   . Hypertension Maternal Grandmother   . Sleep apnea Maternal Grandmother   . Heart disease Maternal Grandmother   . Diabetes Maternal Grandfather   . Hypertension Maternal Grandfather     . Sleep apnea Maternal Grandfather   . Heart disease Maternal Grandfather   . Cancer Maternal Grandfather        prostate  . Cancer Maternal Aunt        Leukemia  . Colon cancer Neg Hx   . Liver disease Neg Hx     Social History Social History   Tobacco Use  . Smoking status: Former Smoker    Packs/day: 0.00    Years: 2.00    Pack years: 0.00    Last attempt to quit: 10/06/2012    Years since quitting: 4.5  . Smokeless tobacco: Never Used  . Tobacco comment: Never smoked cigarettes  Substance Use Topics  . Alcohol use: No    Alcohol/week: 0.0 oz    Comment: denies usage  . Drug use:  No     Allergies   Nsaids; Pineapple; Remicade [infliximab]; Strawberry extract; Amoxicillin-pot clavulanate; Omeprazole; and Tomato   Review of Systems Review of Systems  Respiratory: Positive for cough, shortness of breath and wheezing.   All other systems reviewed and are negative.    Physical Exam Updated Vital Signs BP (!) 136/94 (BP Location: Right Arm)   Pulse 91   Temp 99.5 F (37.5 C) (Oral)   Resp 18   Ht 5' 8"  (1.727 m)   Wt 109.8 kg (242 lb)   SpO2 99%   BMI 36.80 kg/m   Physical Exam  Constitutional: He is oriented to person, place, and time. He appears well-developed and well-nourished. No distress.  HENT:  Head: Normocephalic and atraumatic.  Right Ear: Hearing normal.  Left Ear: Hearing normal.  Nose: Nose normal.  Mouth/Throat: Oropharynx is clear and moist and mucous membranes are normal.  Eyes: Conjunctivae and EOM are normal. Pupils are equal, round, and reactive to light.  Neck: Normal range of motion. Neck supple.  Cardiovascular: Regular rhythm, S1 normal and S2 normal. Exam reveals no gallop and no friction rub.  No murmur heard. Pulmonary/Chest: Effort normal. No respiratory distress. He has decreased breath sounds. He has wheezes. He exhibits no tenderness.  Abdominal: Soft. Normal appearance and bowel sounds are normal. There is no  hepatosplenomegaly. There is no tenderness. There is no rebound, no guarding, no tenderness at McBurney's point and negative Murphy's sign. No hernia.  Musculoskeletal: Normal range of motion.  Neurological: He is alert and oriented to person, place, and time. He has normal strength. No cranial nerve deficit or sensory deficit. Coordination normal. GCS eye subscore is 4. GCS verbal subscore is 5. GCS motor subscore is 6.  Skin: Skin is warm, dry and intact. No rash noted. No cyanosis.  Psychiatric: He has a normal mood and affect. His speech is normal and behavior is normal. Thought content normal.  Nursing note and vitals reviewed.    ED Treatments / Results  Labs (all labs ordered are listed, but only abnormal results are displayed) Labs Reviewed - No data to display  EKG  EKG Interpretation None       Radiology Dg Chest 2 View  Result Date: 04/21/2017 CLINICAL DATA:  Acute onset of productive cough. Shortness of breath and mid chest pain. EXAM: CHEST  2 VIEW COMPARISON:  Chest radiograph performed 03/19/2017 FINDINGS: The lungs are well-aerated. Mild retrocardiac opacity may reflect atelectasis or possibly mild infection. There is no evidence of pleural effusion or pneumothorax. The heart is normal in size; the mediastinal contour is within normal limits. No acute osseous abnormalities are seen. IMPRESSION: Mild retrocardiac opacity may reflect atelectasis or possibly mild infection. Electronically Signed   By: Garald Balding M.D.   On: 04/21/2017 01:25    Procedures Procedures (including critical care time)  Medications Ordered in ED Medications  azithromycin (ZITHROMAX) tablet 500 mg (not administered)  cefTRIAXone (ROCEPHIN) injection 1 g (not administered)  ipratropium-albuterol (DUONEB) 0.5-2.5 (3) MG/3ML nebulizer solution 3 mL (3 mLs Nebulization Given 04/21/17 0207)  albuterol (PROVENTIL) (2.5 MG/3ML) 0.083% nebulizer solution 2.5 mg (2.5 mg Nebulization Given 04/21/17  0215)  predniSONE (DELTASONE) tablet 60 mg (60 mg Oral Given 04/21/17 0154)     Initial Impression / Assessment and Plan / ED Course  I have reviewed the triage vital signs and the nursing notes.  Pertinent labs & imaging results that were available during my care of the patient were reviewed by me and  considered in my medical decision making (see chart for details).     Patient with history of asthma presents with a 2-day history of cough, chest congestion and increasing shortness of breath.  He did have very slight wheezing present on arrival.  He was treated with DuoNeb and improved.  He has not have any tachycardia, tachypnea, hypoxia.  Chest x-ray suspicious for early infiltrate.  Treat with Rocephin and Zithromax here in the ER, will complete outpatient course of Omnicef and Zithromax.  Patient on low-dose prednisone daily, will increase and taper back to his normal dose.  Follow-up with primary doctor for recheck in 1-2 weeks.  Return if symptoms worsen.  Final Clinical Impressions(s) / ED Diagnoses   Final diagnoses:  Community acquired pneumonia of left lower lobe of lung ([place])  Moderate persistent asthma with acute exacerbation    ED Discharge Orders        Ordered    azithromycin (ZITHROMAX) 250 MG tablet  Daily     04/21/17 0237    cefdinir (OMNICEF) 300 MG capsule  2 times daily     04/21/17 0237    predniSONE (DELTASONE) 20 MG tablet     04/21/17 0237       Orpah Greek, MD 04/21/17 [phone]

## 2017-04-21 NOTE — Discharge Instructions (Signed)
Take the higher dose of prednisone and taper down as it is prescribed until it is gone, then go back to your normal dosing.

## 2017-04-21 NOTE — ED Triage Notes (Signed)
Pt states productive cough & trouble breathing. States some chills. Denies any vomiting or diarrhea.

## 2017-04-22 ENCOUNTER — Emergency Department (HOSPITAL_COMMUNITY): Payer: Medicaid Other

## 2017-04-22 ENCOUNTER — Encounter (HOSPITAL_COMMUNITY): Payer: Self-pay | Admitting: Emergency Medicine

## 2017-04-22 ENCOUNTER — Emergency Department (HOSPITAL_COMMUNITY)
Admission: EM | Admit: 2017-04-22 | Discharge: 2017-04-22 | Disposition: A | Discharge status: Home / Self Care | Payer: Medicaid Other | Attending: Emergency Medicine | Admitting: Emergency Medicine

## 2017-04-22 DIAGNOSIS — R042 Hemoptysis: Secondary | ICD-10-CM

## 2017-04-22 DIAGNOSIS — Z87891 Personal history of nicotine dependence: Secondary | ICD-10-CM | POA: Insufficient documentation

## 2017-04-22 DIAGNOSIS — Z79899 Other long term (current) drug therapy: Secondary | ICD-10-CM | POA: Insufficient documentation

## 2017-04-22 DIAGNOSIS — J4520 Mild intermittent asthma, uncomplicated: Secondary | ICD-10-CM | POA: Insufficient documentation

## 2017-04-22 LAB — CBC WITH DIFFERENTIAL/PLATELET
Basophils Absolute: 0 10*3/uL (ref 0.0–0.1)
Basophils Relative: 0 %
Eosinophils Absolute: 0.2 10*3/uL (ref 0.0–0.7)
Eosinophils Relative: 2 %
HCT: 40.9 % (ref 39.0–52.0)
Hemoglobin: 13.8 g/dL (ref 13.0–17.0)
Lymphocytes Relative: 14 %
Lymphs Abs: 1.4 10*3/uL (ref 0.7–4.0)
MCH: 27.5 pg (ref 26.0–34.0)
MCHC: 33.7 g/dL (ref 30.0–36.0)
MCV: 81.5 fL (ref 78.0–100.0)
Monocytes Absolute: 0.3 10*3/uL (ref 0.1–1.0)
Monocytes Relative: 3 %
Neutro Abs: 8.4 10*3/uL — ABNORMAL HIGH (ref 1.7–7.7)
Neutrophils Relative %: 81 %
Platelets: 255 10*3/uL (ref 150–400)
RBC: 5.02 MIL/uL (ref 4.22–5.81)
RDW: 13.6 % (ref 11.5–15.5)
WBC: 10.3 10*3/uL (ref 4.0–10.5)

## 2017-04-22 LAB — COMPREHENSIVE METABOLIC PANEL
ALT: 23 U/L (ref 17–63)
AST: 31 U/L (ref 15–41)
Albumin: 4 g/dL (ref 3.5–5.0)
Alkaline Phosphatase: 76 U/L (ref 38–126)
Anion gap: 6 (ref 5–15)
BUN: 9 mg/dL (ref 6–20)
CO2: 20 mmol/L — ABNORMAL LOW (ref 22–32)
Calcium: 9.3 mg/dL (ref 8.9–10.3)
Chloride: 108 mmol/L (ref 101–111)
Creatinine, Ser: 0.93 mg/dL (ref 0.61–1.24)
GFR calc Af Amer: >60 mL/min (ref >60)
GFR calc non Af Amer: >60 mL/min (ref >60)
Glucose, Bld: 101 mg/dL — ABNORMAL HIGH (ref 65–99)
Potassium: 3.9 mmol/L (ref 3.5–5.1)
Sodium: 134 mmol/L — ABNORMAL LOW (ref 135–145)
Total Bilirubin: 0.6 mg/dL (ref 0.3–1.2)
Total Protein: 7.6 g/dL (ref 6.5–8.1)

## 2017-04-22 MED ORDER — IOPAMIDOL (ISOVUE-300) INJECTION 61%
75.0000 mL | Freq: Once | INTRAVENOUS | Status: AC | PRN
  Administered 2017-04-22: 75 mL via INTRAVENOUS

## 2017-04-22 NOTE — ED Provider Notes (Signed)
[facility] EMERGENCY DEPARTMENT Provider Note   CSN: [id number] Arrival date & time: 04/22/17  1535     History   Chief Complaint Chief Complaint  Patient presents with  . Hemoptysis    HPI Wayne Avila is a 26 y.o. male.  Patient was diagnosed with possible pneumonia yesterday.  He has coughed up blood 3 times a day due to small drops.     The history is provided by the patient.  Cough  This is a recurrent problem. The current episode started more than 2 days ago. The problem occurs constantly. The problem has not changed since onset.The cough is productive of blood-tinged sputum. There has been no fever. Pertinent negatives include no chest pain and no headaches. He has tried nothing for the symptoms. The treatment provided no relief.    Past Medical History:  Diagnosis Date  . ADHD (attention deficit hyperactivity disorder)   . Anxiety   . Asthma   . Autoimmune hepatitis ([employer])   . Bipolar 1 disorder ([place])   . C. difficile colitis 03/22/2016  . Clostridium difficile colitis   . Depression   . GERD (gastroesophageal reflux disease)   . HOH (hard of hearing)   . Mental developmental delay   . Seizures ([place])   . Ulcerative colitis [facility])     Patient Active Problem List   Diagnosis Date Noted  . ADHD 01/09/2017  . Environmental allergies 01/09/2017  . Class 1 obesity due to excess calories with body mass index (BMI) of 34.0 to 34.9 in adult 01/09/2017  . Autoimmune hepatitis ([place]) 01/02/2017  . Asthma in adult, mild intermittent, uncomplicated 43/32/9518  . Anemia 12/31/2016  . Hyperammonemia ([place]) 12/31/2016  . Elevated LFTs 12/31/2016  . Abnormal thyroid function test 03/24/2016  . Ulcerative colitis ([place]) 12/29/2015  . Bipolar 1 disorder, manic, moderate ([place]) 11/09/2014  . Mental developmental delay 11/15/2012    Past Surgical History:  Procedure Laterality Date  . ADENOIDECTOMY    . COLONOSCOPY  11/20/2015   Dr. [name]: Severe erythema, edema,  ulcers from the anal verge to 20 cm above the anal verge without mucosal sparing, remaining colon and terminal ileum appeared normal. Biopsies from the rectum revealed fulminant active chronic colitis consistent with IBD. Pathology from terminal ileum revealed intramucosal lymphoid aggregates. Remaining colon random biopsies with inactive chronic colitis  . ESOPHAGOGASTRODUODENOSCOPY  11/20/2015   Dr. [name]: Normal exam, stomach biopsied and duodenal biopsy.. Benign biopsies.  . TONSILLECTOMY         Home Medications    Prior to Admission medications   Medication Sig Start Date End Date Taking? Authorizing Provider  albuterol (PROVENTIL HFA;VENTOLIN HFA) 108 (90 Base) MCG/ACT inhaler Inhale 2 puffs into the lungs every 4 (four) hours as needed for wheezing or shortness of breath (and/or cough).    [provider]  albuterol (PROVENTIL) (2.5 MG/3ML) 0.083% nebulizer solution Inhale 2.5 mg into the lungs every 6 (six) hours as needed for wheezing or shortness of breath.     [provider]  azithromycin (ZITHROMAX) 250 MG tablet Take 1 tablet (250 mg total) daily by mouth. 04/21/17   Pollina, Gwenyth Allegra, MD  cefdinir (OMNICEF) 300 MG capsule Take 1 capsule (300 mg total) 2 (two) times daily by mouth. 04/21/17   Pollina, Gwenyth Allegra, MD  cholecalciferol (VITAMIN D) 1000 units tablet Take 1 tablet (1,000 Units total) by mouth daily. START AFTER VITAMIN D 50,000 U TABLETS ARE COMPLETE 04/08/16   [name], MD  cloNIDine HCl (KAPVAY) 0.1 MG TB12 ER tablet Take 0.1 mg by mouth 2 (two) times daily.    [provider]  EPINEPHrine (EPIPEN 2-PAK) 0.3 mg/0.3 mL IJ SOAJ injection Inject 0.3 mg into the muscle once as needed (for severe allergic reaction).    [provider]  famotidine (PEPCID) 20 MG tablet Take 1 tablet (20 mg total) by mouth 2 (two) times daily. Patient taking differently: Take 20 mg by mouth daily.  02/21/17   Milton Ferguson, MD  lactulose  (CHRONULAC) 10 GM/15ML solution Take 30 mLs (20 g total) by mouth daily as needed for mild constipation. 01/02/17   Kathie Dike, MD  loperamide (IMODIUM) 2 MG capsule Take 1 capsule (2 mg total) by mouth 4 (four) times daily as needed for diarrhea or loose stools. 03/19/17   Horton, Barbette Hair, MD  loratadine (CLARITIN) 10 MG tablet Take 1 tablet (10 mg total) by mouth daily. 01/09/17   Raylene Everts, MD  metoCLOPramide (REGLAN) 10 MG tablet Take 1 tablet (10 mg total) by mouth every 6 (six) hours as needed for nausea (nausea/headache). 12/23/16   Daleen Bo, MD  montelukast (SINGULAIR) 10 MG tablet TAKE 1 TABLET BY MOUTH EVERY NIGHT AT BEDTIME 03/12/17   Raylene Everts, MD  ondansetron (ZOFRAN ODT) 4 MG disintegrating tablet Take 1 tablet (4 mg total) by mouth every 8 (eight) hours as needed for nausea. 12/13/16   [name], MD  ondansetron (ZOFRAN) 4 MG tablet Take 1 tablet (4 mg total) by mouth every 8 (eight) hours as needed. 03/15/17   Rolland Porter, MD  Oxcarbazepine (TRILEPTAL) 300 MG tablet Take one tablet by mouth twice daily/as directed per prescriber 01/06/17   [provider]  paliperidone (INVEGA) 3 MG 24 hr tablet Take 3 mg by mouth daily.    [provider]  predniSONE (DELTASONE) 10 MG tablet 4 po daily Patient taking differently: Take 2 mg by mouth daily.  01/02/17   Fields, Marga Melnick, MD  predniSONE (DELTASONE) 20 MG tablet 3 tabs po daily x 3 days, then 2 tabs x 3 days, then 1.5 tabs x 3 days, then 1 tab x 3 days, then 0.5 tabs x 3 days 04/21/17   Orpah Greek, MD  sertraline (ZOLOFT) 100 MG tablet Take 1 tablet (100 mg total) by mouth daily. 11/13/14   [name], NP    Family History Family History  Problem Relation Age of Onset  . Asthma Mother   . Ulcers Mother   . Bipolar disorder Mother   . ADD / ADHD Father   . Diabetes Maternal Grandmother   . Hypertension Maternal Grandmother   . Sleep apnea Maternal Grandmother   . Heart  disease Maternal Grandmother   . Diabetes Maternal Grandfather   . Hypertension Maternal Grandfather   . Sleep apnea Maternal Grandfather   . Heart disease Maternal Grandfather   . Cancer Maternal Grandfather        prostate  . Cancer Maternal Aunt        Leukemia  . Colon cancer Neg Hx   . Liver disease Neg Hx     Social History Social History   Tobacco Use  . Smoking status: Former Smoker    Packs/day: 0.00    Years: 2.00    Pack years: 0.00    Last attempt to quit: 10/06/2012    Years since quitting: 4.5  . Smokeless tobacco: Never Used  . Tobacco comment: Never smoked cigarettes  Substance  Use Topics  . Alcohol use: No    Alcohol/week: 0.0 oz    Comment: denies usage  . Drug use: No     Allergies   Nsaids; Pineapple; Remicade [infliximab]; Strawberry extract; Amoxicillin-pot clavulanate; Omeprazole; and Tomato   Review of Systems Review of Systems  Constitutional: Negative for appetite change and fatigue.  HENT: Negative for congestion, ear discharge and sinus pressure.   Eyes: Negative for discharge.  Respiratory: Positive for cough.        Coughing up blood  Cardiovascular: Negative for chest pain.  Gastrointestinal: Negative for abdominal pain and diarrhea.  Genitourinary: Negative for frequency and hematuria.  Musculoskeletal: Negative for back pain.  Skin: Negative for rash.  Neurological: Negative for seizures and headaches.  Psychiatric/Behavioral: Negative for hallucinations.     Physical Exam Updated Vital Signs BP (!) 135/91 (BP Location: Right Arm)   Pulse 82   Temp 98.5 F (36.9 C) (Oral)   Resp 16   Ht 5' 8"  (1.727 m)   Wt 109.8 kg (242 lb)   SpO2 96%   BMI 36.80 kg/m   Physical Exam  Constitutional: He is oriented to person, place, and time. He appears well-developed.  HENT:  Head: Normocephalic.  Eyes: Conjunctivae and EOM are normal. No scleral icterus.  Neck: Neck supple. No thyromegaly present.  Cardiovascular: Normal rate  and regular rhythm. Exam reveals no gallop and no friction rub.  No murmur heard. Pulmonary/Chest: No stridor. He has no wheezes. He has no rales. He exhibits no tenderness.  Abdominal: He exhibits no distension. There is no tenderness. There is no rebound.  Musculoskeletal: Normal range of motion. He exhibits no edema.  Lymphadenopathy:    He has no cervical adenopathy.  Neurological: He is oriented to person, place, and time. He exhibits normal muscle tone. Coordination normal.  Skin: No rash noted. No erythema.  Psychiatric: He has a normal mood and affect. His behavior is normal.     ED Treatments / Results  Labs (all labs ordered are listed, but only abnormal results are displayed) Labs Reviewed  CBC WITH DIFFERENTIAL/PLATELET - Abnormal; Notable for the following components:      Result Value   Neutro Abs 8.4 (*)    All other components within normal limits  COMPREHENSIVE METABOLIC PANEL - Abnormal; Notable for the following components:   Sodium 134 (*)    CO2 20 (*)    Glucose, Bld 101 (*)    All other components within normal limits    EKG  EKG Interpretation None       Radiology Dg Chest 2 View  Result Date: 04/21/2017 CLINICAL DATA:  Acute onset of productive cough. Shortness of breath and mid chest pain. EXAM: CHEST  2 VIEW COMPARISON:  Chest radiograph performed 03/19/2017 FINDINGS: The lungs are well-aerated. Mild retrocardiac opacity may reflect atelectasis or possibly mild infection. There is no evidence of pleural effusion or pneumothorax. The heart is normal in size; the mediastinal contour is within normal limits. No acute osseous abnormalities are seen. IMPRESSION: Mild retrocardiac opacity may reflect atelectasis or possibly mild infection. Electronically Signed   By: Garald Balding M.D.   On: 04/21/2017 01:25   Ct Chest W Contrast  Result Date: 04/22/2017 CLINICAL DATA:  Coughing up blood today. Diagnosed with pneumonia yesterday. EXAM: CT CHEST WITH  CONTRAST TECHNIQUE: Multidetector CT imaging of the chest was performed during intravenous contrast administration. CONTRAST:  50m ISOVUE-300 IOPAMIDOL (ISOVUE-300) INJECTION 61% COMPARISON:  Chest radiographs 04/21/2017 and 03/19/2017. Abdominal  CT 12/12/2016. FINDINGS: Cardiovascular: The contrast bolus is suboptimal. No acute or significant vascular findings are seen. The heart size is normal. There is no pericardial effusion. Mediastinum/Nodes: There are no enlarged mediastinal, hilar or axillary lymph nodes. The thyroid gland, trachea and esophagus demonstrate no significant findings. Lungs/Pleura: No pleural effusion or pneumothorax. The lungs are clear. Upper abdomen: There is diffuse contour irregularity of the liver which is suspicious for cirrhosis, despite the patient's young age. Apparently, there is a history of autoimmune hepatitis. No focal hepatic abnormalities are seen. The visualized upper abdomen is otherwise unremarkable. Musculoskeletal/Chest wall: There is no chest wall mass or suspicious osseous finding. IMPRESSION: 1. Negative chest CT. No evidence of pneumonia. No explanation for hemoptysis. 2. Hepatic cirrhosis. Electronically Signed   By: Richardean Sale M.D.   On: 04/22/2017 18:13    Procedures Procedures (including critical care time)  Medications Ordered in ED Medications  iopamidol (ISOVUE-300) 61 % injection 75 mL (75 mLs Intravenous Contrast Given 04/22/17 1800)     Initial Impression / Assessment and Plan / ED Course  I have reviewed the triage vital signs and the nursing notes.  Pertinent labs & imaging results that were available during my care of the patient were reviewed by me and considered in my medical decision making (see chart for details).   pt with bronchitis and coughing small amounts of blood.  CT chest unremarkable.  Patient will be sent home and continue taking his antibiotics and follow-up with his PCP    Final Clinical Impressions(s) / ED  Diagnoses   Final diagnoses:  Cough with hemoptysis    ED Discharge Orders    None       Milton Ferguson, MD 04/22/17 [phone]

## 2017-04-22 NOTE — ED Triage Notes (Signed)
PT states he was seen and dx with pneumonia yesterday in the ED and started coughing up blood with his sputum around 1300 today.

## 2017-04-22 NOTE — Discharge Instructions (Signed)
Continue taking your medicine and follow-up with your primary care doctor next week for recheck

## 2017-04-29 ENCOUNTER — Encounter: Payer: Self-pay | Admitting: Family Medicine

## 2017-04-29 ENCOUNTER — Other Ambulatory Visit: Payer: Self-pay

## 2017-04-29 ENCOUNTER — Ambulatory Visit: Payer: Medicaid Other | Admitting: Family Medicine

## 2017-04-29 ENCOUNTER — Telehealth: Payer: Self-pay | Admitting: Family Medicine

## 2017-04-29 VITALS — BP 136/100 | HR 88 | Temp 97.7°F | Resp 18 | Ht 68.0 in | Wt 251.1 lb

## 2017-04-29 DIAGNOSIS — J45909 Unspecified asthma, uncomplicated: Secondary | ICD-10-CM

## 2017-04-29 MED ORDER — ALBUTEROL SULFATE (2.5 MG/3ML) 0.083% IN NEBU: 2.5000 mg | INHALATION_SOLUTION | Freq: Four times a day (QID) | RESPIRATORY_TRACT | 2 refills | Status: DC | PRN

## 2017-04-29 MED ORDER — NEBULIZER COMPRESSOR KIT: PACK | 0 refills | Status: AC

## 2017-04-29 NOTE — Telephone Encounter (Signed)
Done

## 2017-04-29 NOTE — Progress Notes (Signed)
Chief Complaint  Patient presents with  . Asthma    [facility] ED F/U   Here for follow-up.  He was seen in [facility] emergency room twice last week.  First he was seen for coughing and chest congestion.  It was felt he had a flare of his asthma with possible infection/pneumonia.  The next day he coughed up a few spots of blood.  He went back to the emergency room.  A CT was done of his chest.  The CT scan was negative.  He was treated with prednisone and antibiotics.  He is using his albuterol.  His nebulizer broke and he needs a new one. At this point he is back to normal.  His breathing is unlabored.  He is completed his medicines although has another day or 2 of antibiotics.  He is accompanied by his mother.  She states he is well.  Patient Active Problem List   Diagnosis Date Noted  . ADHD 01/09/2017  . Environmental allergies 01/09/2017  . Class 1 obesity due to excess calories with body mass index (BMI) of 34.0 to 34.9 in adult 01/09/2017  . Autoimmune hepatitis ([place]) 01/02/2017  . Asthma in adult, mild intermittent, uncomplicated 53/64/6803  . Anemia 12/31/2016  . Hyperammonemia ([place]) 12/31/2016  . Elevated LFTs 12/31/2016  . Abnormal thyroid function test 03/24/2016  . Ulcerative colitis ([place]) 12/29/2015  . Bipolar 1 disorder, manic, moderate ([place]) 11/09/2014  . Mental developmental delay 11/15/2012    Outpatient Encounter Medications as of 04/29/2017  Medication Sig  . albuterol (PROVENTIL HFA;VENTOLIN HFA) 108 (90 Base) MCG/ACT inhaler Inhale 2 puffs into the lungs every 4 (four) hours as needed for wheezing or shortness of breath (and/or cough).  Marland Kitchen albuterol (PROVENTIL) (2.5 MG/3ML) 0.083% nebulizer solution Inhale 3 mLs (2.5 mg total) into the lungs every 6 (six) hours as needed for wheezing or shortness of breath.  Marland Kitchen azithromycin (ZITHROMAX) 250 MG tablet Take 1 tablet (250 mg total) daily by mouth.  . cholecalciferol (VITAMIN D) 1000 units tablet Take 1 tablet (1,000 Units  total) by mouth daily. START AFTER VITAMIN D 50,000 U TABLETS ARE COMPLETE  . cloNIDine HCl (KAPVAY) 0.1 MG TB12 ER tablet Take 0.1 mg by mouth 2 (two) times daily.  Marland Kitchen EPINEPHrine (EPIPEN 2-PAK) 0.3 mg/0.3 mL IJ SOAJ injection Inject 0.3 mg into the muscle once as needed (for severe allergic reaction).  . famotidine (PEPCID) 20 MG tablet Take 1 tablet (20 mg total) by mouth 2 (two) times daily. (Patient taking differently: Take 20 mg by mouth daily. )  . lactulose (CHRONULAC) 10 GM/15ML solution Take 30 mLs (20 g total) by mouth daily as needed for mild constipation.  Marland Kitchen loperamide (IMODIUM) 2 MG capsule Take 1 capsule (2 mg total) by mouth 4 (four) times daily as needed for diarrhea or loose stools.  Marland Kitchen loratadine (CLARITIN) 10 MG tablet Take 1 tablet (10 mg total) by mouth daily.  . metoCLOPramide (REGLAN) 10 MG tablet Take 1 tablet (10 mg total) by mouth every 6 (six) hours as needed for nausea (nausea/headache).  . montelukast (SINGULAIR) 10 MG tablet TAKE 1 TABLET BY MOUTH EVERY NIGHT AT BEDTIME  . ondansetron (ZOFRAN ODT) 4 MG disintegrating tablet Take 1 tablet (4 mg total) by mouth every 8 (eight) hours as needed for nausea.  . ondansetron (ZOFRAN) 4 MG tablet Take 1 tablet (4 mg total) by mouth every 8 (eight) hours as needed.  . Oxcarbazepine (TRILEPTAL) 300 MG tablet Take one tablet  by mouth twice daily/as directed per prescriber  . paliperidone (INVEGA) 3 MG 24 hr tablet Take 3 mg by mouth daily.  . predniSONE (DELTASONE) 10 MG tablet 4 po daily (Patient taking differently: Take 2 mg by mouth daily. )  . predniSONE (DELTASONE) 20 MG tablet 3 tabs po daily x 3 days, then 2 tabs x 3 days, then 1.5 tabs x 3 days, then 1 tab x 3 days, then 0.5 tabs x 3 days  . sertraline (ZOLOFT) 100 MG tablet Take 1 tablet (100 mg total) by mouth daily.  . [DISCONTINUED] albuterol (PROVENTIL) (2.5 MG/3ML) 0.083% nebulizer solution Inhale 2.5 mg into the lungs every 6 (six) hours as needed for wheezing or  shortness of breath.   Marland Kitchen Respiratory Therapy Supplies (NEBULIZER COMPRESSOR) KIT And supplies  . [DISCONTINUED] cefdinir (OMNICEF) 300 MG capsule Take 1 capsule (300 mg total) 2 (two) times daily by mouth. (Patient not taking: Reported on 04/29/2017)   No facility-administered encounter medications on file as of 04/29/2017.     Allergies  Allergen Reactions  . Nsaids Other (See Comments)    Should not take due to GI condition  . Pineapple Swelling and Other (See Comments)    Reaction:  Lip swelling  . Remicade [Infliximab] Other (See Comments)    AUTOIMMUNE HEPATITIS  . Strawberry Extract Swelling    Reaction:  Lip swelling  . Amoxicillin-Pot Clavulanate Nausea And Vomiting and Other (See Comments)    GI intolerance   . Omeprazole Nausea And Vomiting  . Tomato Rash   Review of Systems  Constitutional: Negative for chills, fatigue, fever and unexpected weight change.  HENT: Negative for congestion and dental problem.   Eyes: Negative for photophobia and visual disturbance.  Respiratory: Negative for cough and shortness of breath.   Cardiovascular: Negative for chest pain and palpitations.  Gastrointestinal: Negative for abdominal pain, constipation and diarrhea.  Genitourinary: Negative for difficulty urinating and urgency.  Musculoskeletal: Negative for arthralgias and back pain.  Skin: Negative for rash.  Neurological: Negative for light-headedness and headaches.  Psychiatric/Behavioral:       Mother states no behavior problem    BP (!) 136/100 (BP Location: Right Arm, Patient Position: Sitting, Cuff Size: Large)   Pulse 88   Temp 97.7 F (36.5 C) (Temporal)   Resp 18   Ht 5' 8"  (1.727 m)   Wt 251 lb 1.3 oz (113.9 kg)   SpO2 99%   BMI 38.18 kg/m   Physical Exam  Constitutional: He appears well-developed and well-nourished.  Obese, pleasant, cooperative  HENT:  Head: Normocephalic and atraumatic.  Right Ear: External ear normal.  Left Ear: External ear normal.    Mouth/Throat: Oropharynx is clear and moist.  Eyes: Conjunctivae are normal. Pupils are equal, round, and reactive to light.  Neck: Neck supple.  Cardiovascular: Normal rate, regular rhythm and normal heart sounds.  Pulmonary/Chest: Effort normal and breath sounds normal. He has no wheezes. He has no rales.  Musculoskeletal: Normal range of motion. He exhibits no edema.  Lymphadenopathy:    He has no cervical adenopathy.  Psychiatric: He has a normal mood and affect. His behavior is normal.  Poor fund of knowledge, simple answers.  Smiles, seems happy    ASSESSMENT/PLAN:  1. Moderate asthma without complication, unspecified whether persistent Recent exacerbation.  Cleared.  He is finished with his prednisone.  No wheezing.  We will refill his medications.  Asthma plan discussed with mother. - DME Nebulizer machine   Patient Instructions  Drink  plenty of water See me every 3 months Call for problems or questions   Raylene Everts, MD

## 2017-04-29 NOTE — Patient Instructions (Signed)
Drink plenty of water See me every 3 months Call for problems or questions

## 2017-04-29 NOTE — Telephone Encounter (Signed)
[place] left message on nurse line regarding patient. She states that they received a prescription for respiratory supplies- nebulizer suppressor kit- but there was no diagnosis on prescription. They are also needed demographics and insurance information. Fax# [phone] Callback# [phone]

## 2017-05-04 ENCOUNTER — Emergency Department (HOSPITAL_COMMUNITY)
Admission: EM | Admit: 2017-05-04 | Discharge: 2017-05-04 | Disposition: A | Discharge status: Home / Self Care | Payer: Medicaid Other | Attending: Emergency Medicine | Admitting: Emergency Medicine

## 2017-05-04 ENCOUNTER — Telehealth: Payer: Self-pay | Admitting: *Deleted

## 2017-05-04 ENCOUNTER — Emergency Department (HOSPITAL_COMMUNITY): Payer: Medicaid Other

## 2017-05-04 ENCOUNTER — Encounter (HOSPITAL_COMMUNITY): Payer: Self-pay | Admitting: *Deleted

## 2017-05-04 ENCOUNTER — Other Ambulatory Visit: Payer: Self-pay | Admitting: Family Medicine

## 2017-05-04 ENCOUNTER — Other Ambulatory Visit: Payer: Self-pay

## 2017-05-04 DIAGNOSIS — Z79899 Other long term (current) drug therapy: Secondary | ICD-10-CM | POA: Diagnosis not present

## 2017-05-04 DIAGNOSIS — D649 Anemia, unspecified: Secondary | ICD-10-CM | POA: Diagnosis not present

## 2017-05-04 DIAGNOSIS — J45909 Unspecified asthma, uncomplicated: Secondary | ICD-10-CM | POA: Insufficient documentation

## 2017-05-04 DIAGNOSIS — R112 Nausea with vomiting, unspecified: Secondary | ICD-10-CM | POA: Diagnosis present

## 2017-05-04 DIAGNOSIS — R197 Diarrhea, unspecified: Secondary | ICD-10-CM | POA: Insufficient documentation

## 2017-05-04 DIAGNOSIS — R1084 Generalized abdominal pain: Secondary | ICD-10-CM | POA: Diagnosis not present

## 2017-05-04 DIAGNOSIS — R509 Fever, unspecified: Secondary | ICD-10-CM | POA: Insufficient documentation

## 2017-05-04 DIAGNOSIS — Z87891 Personal history of nicotine dependence: Secondary | ICD-10-CM | POA: Insufficient documentation

## 2017-05-04 DIAGNOSIS — K529 Noninfective gastroenteritis and colitis, unspecified: Secondary | ICD-10-CM | POA: Diagnosis not present

## 2017-05-04 LAB — COMPREHENSIVE METABOLIC PANEL
ALT: 29 U/L (ref 17–63)
AST: 30 U/L (ref 15–41)
Albumin: 4.3 g/dL (ref 3.5–5.0)
Alkaline Phosphatase: 76 U/L (ref 38–126)
Anion gap: 11 (ref 5–15)
BUN: 14 mg/dL (ref 6–20)
CO2: 21 mmol/L — ABNORMAL LOW (ref 22–32)
Calcium: 9.8 mg/dL (ref 8.9–10.3)
Chloride: 108 mmol/L (ref 101–111)
Creatinine, Ser: 1.05 mg/dL (ref 0.61–1.24)
GFR calc Af Amer: >60 mL/min (ref >60)
GFR calc non Af Amer: >60 mL/min (ref >60)
Glucose, Bld: 122 mg/dL — ABNORMAL HIGH (ref 65–99)
Potassium: 3.7 mmol/L (ref 3.5–5.1)
Sodium: 140 mmol/L (ref 135–145)
Total Bilirubin: 0.4 mg/dL (ref 0.3–1.2)
Total Protein: 7.9 g/dL (ref 6.5–8.1)

## 2017-05-04 LAB — CBC
HCT: 41.8 % (ref 39.0–52.0)
Hemoglobin: 13.9 g/dL (ref 13.0–17.0)
MCH: 27.4 pg (ref 26.0–34.0)
MCHC: 33.3 g/dL (ref 30.0–36.0)
MCV: 82.4 fL (ref 78.0–100.0)
Platelets: 271 10*3/uL (ref 150–400)
RBC: 5.07 MIL/uL (ref 4.22–5.81)
RDW: 14.2 % (ref 11.5–15.5)
WBC: 21.6 10*3/uL — ABNORMAL HIGH (ref 4.0–10.5)

## 2017-05-04 LAB — LIPASE, BLOOD: Lipase: 31 U/L (ref 11–51)

## 2017-05-04 MED ORDER — KETOROLAC TROMETHAMINE 30 MG/ML IJ SOLN
30.0000 mg | Freq: Once | INTRAMUSCULAR | Status: AC
  Administered 2017-05-04: 30 mg via INTRAVENOUS
  Filled 2017-05-04: qty 1

## 2017-05-04 MED ORDER — ONDANSETRON HCL 4 MG/2ML IJ SOLN
4.0000 mg | Freq: Once | INTRAMUSCULAR | Status: AC
  Administered 2017-05-04: 4 mg via INTRAVENOUS
  Filled 2017-05-04: qty 2

## 2017-05-04 MED ORDER — ONDANSETRON 8 MG PO TBDP: ORAL_TABLET | ORAL | 0 refills | Status: DC

## 2017-05-04 MED ORDER — IOPAMIDOL (ISOVUE-300) INJECTION 61%
100.0000 mL | Freq: Once | INTRAVENOUS | Status: AC | PRN
  Administered 2017-05-04: 100 mL via INTRAVENOUS

## 2017-05-04 MED ORDER — HYDROCODONE-ACETAMINOPHEN 5-325 MG PO TABS: 1.0000 | ORAL_TABLET | Freq: Four times a day (QID) | ORAL | 0 refills | Status: DC | PRN

## 2017-05-04 MED ORDER — SODIUM CHLORIDE 0.9 % IV BOLUS (SEPSIS)
1000.0000 mL | Freq: Once | INTRAVENOUS | Status: AC
Start: 2017-05-04 — End: 2017-05-04
  Administered 2017-05-04: 1000 mL via INTRAVENOUS

## 2017-05-04 NOTE — ED Triage Notes (Signed)
Mom states pt has been having n/v/d all day

## 2017-05-04 NOTE — Telephone Encounter (Signed)
Called and spoke to pt, aware of mds advice and need to be seen tomorrow at 11..please sch this.

## 2017-05-04 NOTE — Telephone Encounter (Signed)
Patient's mom called stating patient went to the ER and they told him he has a virus and one of his test came back abnormal. Mom states patient is having vomiting, and abdominal pain. Mom states patient did have diarrhea but that has stopped. Please advise [phone]  Mom requested an appointment for today, when can this patient be scheduled?

## 2017-05-04 NOTE — ED Provider Notes (Signed)
[facility] EMERGENCY DEPARTMENT Provider Note   CSN: [id number] Arrival date & time: 05/04/17  0053     History   Chief Complaint Chief Complaint  Patient presents with  . Emesis    HPI Wayne Avila is a 26 y.o. male.  Patient is a 26 year old male with past medical history of bipolar disorder, colitis, autoimmune hepatitis, ADHD, and anxiety.  He presents today for evaluation of abdominal pain, nausea, vomiting, and diarrhea.  This started earlier this afternoon.  He is felt chilled but has not taken his temperature at home.  All vomit and stool has been nonbloody.  He denies any ill contacts or having consumed any suspicious foods.   The history is provided by the patient.  Emesis   This is a new problem. Episode onset: This afternoon. The problem occurs continuously. The problem has been rapidly worsening. The emesis has an appearance of stomach contents. The maximum temperature recorded prior to his arrival was 100 to 100.9 F. The fever has been present for less than 1 day. Associated symptoms include abdominal pain, chills, diarrhea and a fever.    Past Medical History:  Diagnosis Date  . ADHD (attention deficit hyperactivity disorder)   . Anxiety   . Asthma   . Autoimmune hepatitis ([place])   . Bipolar 1 disorder ([place])   . C. difficile colitis 03/22/2016  . Clostridium difficile colitis   . Depression   . GERD (gastroesophageal reflux disease)   . HOH (hard of hearing)   . Mental developmental delay   . Seizures ([place])   . Ulcerative colitis [facility])     Patient Active Problem List   Diagnosis Date Noted  . ADHD 01/09/2017  . Environmental allergies 01/09/2017  . Class 1 obesity due to excess calories with body mass index (BMI) of 34.0 to 34.9 in adult 01/09/2017  . Autoimmune hepatitis ([place]) 01/02/2017  . Asthma in adult, mild intermittent, uncomplicated 80/99/8338  . Anemia 12/31/2016  . Hyperammonemia ([place]) 12/31/2016  . Elevated LFTs 12/31/2016  . Abnormal  thyroid function test 03/24/2016  . Ulcerative colitis ([place]) 12/29/2015  . Bipolar 1 disorder, manic, moderate ([place]) 11/09/2014  . Mental developmental delay 11/15/2012    Past Surgical History:  Procedure Laterality Date  . ADENOIDECTOMY    . BIOPSY N/A 12/07/2012   Procedure: GASTRIC BIOPSIES;  Surgeon: [name], MD;  Location: [facility] ORS;  Service: Endoscopy;  Laterality: N/A;  . BIOPSY N/A 08/30/2013   Procedure: BIOPSY;  Surgeon: [name], MD;  Location: [facility] ORS;  Service: Endoscopy;  Laterality: N/A;  right colon,transverse colon, left colon, rectal biopsies  . BIOPSY  11/20/2015   Procedure: BIOPSY;  Surgeon: [name], MD;  Location: [facility] ENDO SUITE;  Service: Endoscopy;;  ileal, right colon biopsy, left colon, rectum  . BIOPSY  04/18/2016   Procedure: BIOPSY;  Surgeon: [name], MD;  Location: [facility] ENDO SUITE;  Service: Endoscopy;;  left descending colon biopsies  . COLONOSCOPY  11/20/2015   Dr. [name]: Severe erythema, edema, ulcers from the anal verge to 20 cm above the anal verge without mucosal sparing, remaining colon and terminal ileum appeared normal. Biopsies from the rectum revealed fulminant active chronic colitis consistent with IBD. Pathology from terminal ileum revealed intramucosal lymphoid aggregates. Remaining colon random biopsies with inactive chronic colitis  . COLONOSCOPY WITH PROPOFOL N/A 08/30/2013   SNK:[id number] mucosa in the terminal ileum/COLITIS/ MILD PROCTITIS. Biopsies showed patchy chronic active colitis of the right colon and rectum,  overall findings most consistent with idiopathic inflammatory bowel disease.  Marland Kitchen COLONOSCOPY WITH PROPOFOL N/A 11/20/2015   Dr. 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 ESOPHAGOGASTRODUODENOSCOPY  11/20/2015   Dr. [name]: Normal exam, stomach biopsied and duodenal biopsy.. Benign biopsies.  . ESOPHAGOGASTRODUODENOSCOPY (EGD) WITH PROPOFOL N/A 12/07/2012   SLF:The mucosa of the  esophagus appeared normal Non-erosive gastritis (inflammation) was found in the gastric antrum; multiple biopsies The duodenal mucosa showed no abnormalities in the bulb and second portion of the duodenum  . ESOPHAGOGASTRODUODENOSCOPY (EGD) WITH PROPOFOL N/A 11/20/2015   Dr. [name]: normal with normal biopsies, negative H.pylori   . ESOPHAGOGASTRODUODENOSCOPY (EGD) WITH PROPOFOL N/A 04/18/2016   Procedure: ESOPHAGOGASTRODUODENOSCOPY (EGD) WITH PROPOFOL;  Surgeon: [name], MD;  Location[facility];  Service: Endoscopy;  Laterality: N/A;  . FLEXIBLE SIGMOIDOSCOPY N/A 04/18/2016   Procedure: FLEXIBLE SIGMOIDOSCOPY;  Surgeon: [name], MD;  Location: [facility] END[facility] SU[facility];  Service: Endoscopy;  Laterality: N/A;  . TONSILLECTOMY         Home Medications    Prior to Admission medications   Medication Sig Start Date End Date Taking? Authorizing Provider  albuterol (PROVENTIL HFA;VENTOLIN HFA) 108 (90 Base) MCG/ACT inhaler Inhale 2 puffs into the lungs every 4 (four) hours as needed for wheezing or shortness of breath (and/or cough).    [provider]  albuterol (PROVENTIL) (2.5 MG/3ML) 0.083% nebulizer solution Inhale 3 mLs (2.5 mg total) into the lungs every 6 (six) hours as needed for wheezing or shortness of breath. 04/29/17   Raylene Everts, MD  azithromycin (ZITHROMAX) 250 MG tablet Take 1 tablet (250 mg total) daily by mouth. 04/21/17   Pollina, Gwenyth Allegra, MD  cholecalciferol (VITAMIN D) 1000 units tablet Take 1 tablet (1,000 Units total) by mouth daily. START AFTER VITAMIN D 50,000 U TABLETS ARE COMPLETE 04/08/16   Fields, Marga Melnick, MD  cloNIDine HCl (KAPVAY) 0.1 MG TB12 ER tablet Take 0.1 mg by mouth 2 (two) times daily.    [provider]  EPINEPHrine (EPIPEN 2-PAK) 0.3 mg/0.3 mL IJ SOAJ injection Inject 0.3 mg into the muscle once as needed (for severe allergic reaction).    [provider]  famotidine (PEPCID) 20 MG tablet Take 1 tablet (20 mg  total) by mouth 2 (two) times daily. Patient taking differently: Take 20 mg by mouth daily.  02/21/17   Milton Ferguson, MD  lactulose (CHRONULAC) 10 GM/15ML solution Take 30 mLs (20 g total) by mouth daily as needed for mild constipation. 01/02/17   Kathie Dike, MD  loperamide (IMODIUM) 2 MG capsule Take 1 capsule (2 mg total) by mouth 4 (four) times daily as needed for diarrhea or loose stools. 03/19/17   Horton, Barbette Hair, MD  loratadine (CLARITIN) 10 MG tablet Take 1 tablet (10 mg total) by mouth daily. 01/09/17   Raylene Everts, MD  metoCLOPramide (REGLAN) 10 MG tablet Take 1 tablet (10 mg total) by mouth every 6 (six) hours as needed for nausea (nausea/headache). 12/23/16   Daleen Bo, MD  montelukast (SINGULAIR) 10 MG tablet TAKE 1 TABLET BY MOUTH EVERY NIGHT AT BEDTIME 03/12/17   Raylene Everts, MD  ondansetron (ZOFRAN ODT) 4 MG disintegrating tablet Take 1 tablet (4 mg total) by mouth every 8 (eight) hours as needed for nausea. 12/13/16   [name], MD  ondansetron (ZOFRAN) 4 MG tablet Take 1 tablet (4 mg total) by mouth every 8 (eight) hours as needed. 03/15/17   Tomi Bamberger,  Iva, MD  Oxcarbazepine (TRILEPTAL) 300 MG tablet Take one tablet by mouth twice daily/as directed per prescriber 01/06/17   [provider]  paliperidone (INVEGA) 3 MG 24 hr tablet Take 3 mg by mouth daily.    [provider]  predniSONE (DELTASONE) 10 MG tablet 4 po daily Patient taking differently: Take 2 mg by mouth daily.  01/02/17   Fields, Marga Melnick, MD  predniSONE (DELTASONE) 20 MG tablet 3 tabs po daily x 3 days, then 2 tabs x 3 days, then 1.5 tabs x 3 days, then 1 tab x 3 days, then 0.5 tabs x 3 days 04/21/17   Orpah Greek, MD  Respiratory Therapy Supplies (NEBULIZER COMPRESSOR) KIT And supplies 04/29/17   Raylene Everts, MD  sertraline (ZOLOFT) 100 MG tablet Take 1 tablet (100 mg total) by mouth daily. 11/13/14   [name], NP    Family History Family History  Problem  Relation Age of Onset  . Asthma Mother   . Ulcers Mother   . Bipolar disorder Mother   . ADD / ADHD Father   . Diabetes Maternal Grandmother   . Hypertension Maternal Grandmother   . Sleep apnea Maternal Grandmother   . Heart disease Maternal Grandmother   . Diabetes Maternal Grandfather   . Hypertension Maternal Grandfather   . Sleep apnea Maternal Grandfather   . Heart disease Maternal Grandfather   . Cancer Maternal Grandfather        prostate  . Cancer Maternal Aunt        Leukemia  . Colon cancer Neg Hx   . Liver disease Neg Hx     Social History Social History   Tobacco Use  . Smoking status: Former Smoker    Packs/day: 0.00    Years: 2.00    Pack years: 0.00    Last attempt to quit: 10/06/2012    Years since quitting: 4.5  . Smokeless tobacco: Never Used  . Tobacco comment: Never smoked cigarettes  Substance Use Topics  . Alcohol use: No    Alcohol/week: 0.0 oz    Comment: denies usage  . Drug use: No     Allergies   Nsaids; Pineapple; Remicade [infliximab]; Strawberry extract; Amoxicillin-pot clavulanate; Omeprazole; and Tomato   Review of Systems Review of Systems  Constitutional: Positive for chills and fever.  Gastrointestinal: Positive for abdominal pain, diarrhea and vomiting.  All other systems reviewed and are negative.    Physical Exam Updated Vital Signs BP 121/72 (BP Location: Left Arm)   Pulse (!) 135   Temp 100.3 F (37.9 C) (Oral)   Resp (!) 31   Ht 5' 8"  (1.727 m)   Wt 113.9 kg (251 lb)   SpO2 100%   BMI 38.16 kg/m   Physical Exam  Constitutional: He is oriented to person, place, and time. He appears well-developed and well-nourished. No distress.  HENT:  Head: Normocephalic and atraumatic.  Mouth/Throat: Oropharynx is clear and moist.  Neck: Normal range of motion. Neck supple.  Cardiovascular: Normal rate and regular rhythm. Exam reveals no friction rub.  No murmur heard. Pulmonary/Chest: Effort normal and breath sounds  normal. No respiratory distress. He has no wheezes. He has no rales.  Abdominal: Soft. Bowel sounds are normal. He exhibits no distension. There is tenderness. There is no rebound and no guarding.  There is generalized abdominal tenderness.  Musculoskeletal: Normal range of motion. He exhibits no edema.  Neurological: He is alert and oriented to person, place, and time.  Coordination normal.  Skin: Skin is warm and dry. He is not diaphoretic.  Nursing note and vitals reviewed.    ED Treatments / Results  Labs (all labs ordered are listed, but only abnormal results are displayed) Labs Reviewed  LIPASE, BLOOD  COMPREHENSIVE METABOLIC PANEL  CBC  URINALYSIS, ROUTINE W REFLEX MICROSCOPIC    EKG  EKG Interpretation None       Radiology No results found.  Procedures Procedures (including critical care time)  Medications Ordered in ED Medications  sodium chloride 0.9 % bolus 1,000 mL (not administered)  ondansetron (ZOFRAN) injection 4 mg (not administered)  ketorolac (TORADOL) 30 MG/ML injection 30 mg (not administered)     Initial Impression / Assessment and Plan / ED Course  I have reviewed the triage vital signs and the nursing notes.  Pertinent labs & imaging results that were available during my care of the patient were reviewed by me and considered in my medical decision making (see chart for details).  Patient presenting with abdominal pain, nausea, and vomiting as described in the HPI.  His symptoms are most likely viral in nature.  He does have an elevated white count, and for this reason a CT scan was obtained.  This revealed no acute intra-abdominal pathology.  He was hydrated with saline and medicated with pain and nausea medicine and is now resting comfortably.  I see no reason for admission and feel as though he is appropriate for discharge.  Final Clinical Impressions(s) / ED Diagnoses   Final diagnoses:  None    ED Discharge Orders    None         Veryl Speak, MD 05/04/17 [phone]

## 2017-05-04 NOTE — Telephone Encounter (Signed)
I scheduled patient for tomorrow at 28

## 2017-05-04 NOTE — Telephone Encounter (Signed)
Just seen yesterday Needs clear liquids and to take the zofran for the vomiting See me tomorrow to repeat lab tests

## 2017-05-04 NOTE — Discharge Instructions (Signed)
Zofran as prescribed as needed for nausea.  Hydrocodone is prescribed as needed for pain.  Return to the emergency department for worsening pain, high fever, bloody stool, or other new and concerning symptoms.

## 2017-05-05 ENCOUNTER — Encounter: Payer: Self-pay | Admitting: Family Medicine

## 2017-05-05 ENCOUNTER — Ambulatory Visit: Payer: Medicaid Other | Admitting: Family Medicine

## 2017-05-05 VITALS — BP 124/88 | HR 92 | Temp 97.2°F | Resp 20 | Ht 68.0 in | Wt 237.1 lb

## 2017-05-05 DIAGNOSIS — K51011 Ulcerative (chronic) pancolitis with rectal bleeding: Secondary | ICD-10-CM | POA: Diagnosis not present

## 2017-05-05 DIAGNOSIS — R197 Diarrhea, unspecified: Secondary | ICD-10-CM

## 2017-05-05 LAB — CBC WITH DIFFERENTIAL/PLATELET
Basophils Absolute: 35 cells/uL (ref 0–200)
Basophils Relative: 0.3 %
Eosinophils Absolute: 81 cells/uL (ref 15–500)
Eosinophils Relative: 0.7 %
HCT: 41.3 % (ref 38.5–50.0)
Hemoglobin: 13.6 g/dL (ref 13.2–17.1)
Lymphs Abs: 1162 cells/uL (ref 850–3900)
MCH: 26.5 pg — ABNORMAL LOW (ref 27.0–33.0)
MCHC: 32.9 g/dL (ref 32.0–36.0)
MCV: 80.4 fL (ref 80.0–100.0)
MPV: 11.5 fL (ref 7.5–12.5)
Monocytes Relative: 3.7 %
Neutro Abs: 9798 cells/uL — ABNORMAL HIGH (ref 1500–7800)
Neutrophils Relative %: 85.2 %
Platelets: 246 10*3/uL (ref 140–400)
RBC: 5.14 10*6/uL (ref 4.20–5.80)
RDW: 13.6 % (ref 11.0–15.0)
Total Lymphocyte: 10.1 %
WBC mixed population: 426 cells/uL (ref 200–950)
WBC: 11.5 10*3/uL — ABNORMAL HIGH (ref 3.8–10.8)

## 2017-05-05 LAB — BASIC METABOLIC PANEL WITH GFR
BUN: 11 mg/dL (ref 7–25)
CO2: 24 mmol/L (ref 20–32)
Calcium: 8.7 mg/dL (ref 8.6–10.3)
Chloride: 103 mmol/L (ref 98–110)
Creat: 0.96 mg/dL (ref 0.60–1.35)
GFR, Est African American: 128 mL/min/{1.73_m2} (ref >=60)
GFR, Est Non African American: 110 mL/min/{1.73_m2} (ref >=60)
Glucose, Bld: 112 mg/dL (ref 65–139)
Potassium: 3.9 mmol/L (ref 3.5–5.3)
Sodium: 137 mmol/L (ref 135–146)

## 2017-05-05 MED ORDER — DIPHENOXYLATE-ATROPINE 2.5-0.025 MG PO TABS: 1.0000 | ORAL_TABLET | Freq: Four times a day (QID) | ORAL | 0 refills | Status: DC | PRN

## 2017-05-05 NOTE — Patient Instructions (Addendum)
Need blood testing and stool test for C diff today Continue to push fluids May take lomotil for diarrhea Bland diet Rest I will send you a letter with your test results.  If there is anything of concern, we will call right away.   Diarrhea, Adult Diarrhea is when you have loose and water poop (stool) often. Diarrhea can make you feel weak and cause you to get dehydrated. Dehydration can make you tired and thirsty, make you have a dry mouth, and make it so you pee (urinate) less often. Diarrhea often lasts 2-3 days. However, it can last longer if it is a sign of something more serious. It is important to treat your diarrhea as told by your doctor. Follow these instructions at home: Eating and drinking  Follow these recommendations as told by your doctor:  Take an oral rehydration solution (ORS). This is a drink that is sold at pharmacies and stores.  Drink clear fluids, such as: ? Water. ? Ice chips. ? Diluted fruit juice. ? Low-calorie sports drinks.  Eat bland, easy-to-digest foods in small amounts as you are able. These foods include: ? Bananas. ? Applesauce. ? Rice. ? Low-fat (lean) meats. ? Toast. ? Crackers.  Avoid drinking fluids that have a lot of sugar or caffeine in them.  Avoid alcohol.  Avoid spicy or fatty foods.  General instructions   Drink enough fluid to keep your pee (urine) clear or pale yellow.  Wash your hands often. If you cannot use soap and water, use hand sanitizer.  Make sure that all people in your home wash their hands well and often.  Take over-the-counter and prescription medicines only as told by your doctor.  Rest at home while you get better.  Watch your condition for any changes.  Take a warm bath to help with any burning or pain from having diarrhea.  Keep all follow-up visits as told by your doctor. This is important. Contact a doctor if:  You have a fever.  Your diarrhea gets worse.  You have new symptoms.  You cannot  keep fluids down.  You feel light-headed or dizzy.

## 2017-05-05 NOTE — Progress Notes (Signed)
Chief Complaint  Patient presents with  . Follow-up    [facility] ED F/U   Patient is here for [facility] emergency room follow-up.  He was seen there for abdominal pain, vomiting, and diarrhea.  He was given IV fluids, and Zofran.  His blood work did show an elevated white blood cell count.  A CAT scan was performed.  It was normal, only unexpected finding was cirrhosis of his liver which is known.  He went home and is drinking plenty of fluids.  He stopped vomiting yesterday.  No food yet.  Appetite is poor.  He appears tired.  No fever.  Still having some crampy abdominal pain and lots of watery greenish diarrhea.  Mother expresses concern because of his history of C. difficile.  He has known ulcerative colitis.  No blood or mucus in the stools.  He did have recent antibiotic treatment and finished the medicine several days before the diarrhea started.  Patient Active Problem List   Diagnosis Date Noted  . ADHD 01/09/2017  . Environmental allergies 01/09/2017  . Class 1 obesity due to excess calories with body mass index (BMI) of 34.0 to 34.9 in adult 01/09/2017  . Autoimmune hepatitis ([place]) 01/02/2017  . Asthma in adult, mild intermittent, uncomplicated 63/87/5643  . Anemia 12/31/2016  . Hyperammonemia ([place]) 12/31/2016  . Elevated LFTs 12/31/2016  . Abnormal thyroid function test 03/24/2016  . Ulcerative colitis ([place]) 12/29/2015  . Bipolar 1 disorder, manic, moderate ([place]) 11/09/2014  . Mental developmental delay 11/15/2012    Outpatient Encounter Medications as of 05/05/2017  Medication Sig  . albuterol (PROVENTIL HFA;VENTOLIN HFA) 108 (90 Base) MCG/ACT inhaler Inhale 2 puffs into the lungs every 4 (four) hours as needed for wheezing or shortness of breath (and/or cough).  Marland Kitchen albuterol (PROVENTIL) (2.5 MG/3ML) 0.083% nebulizer solution Inhale 3 mLs (2.5 mg total) into the lungs every 6 (six) hours as needed for wheezing or shortness of breath.  . cholecalciferol (VITAMIN D) 1000 units  tablet Take 1 tablet (1,000 Units total) by mouth daily. START AFTER VITAMIN D 50,000 U TABLETS ARE COMPLETE  . cloNIDine HCl (KAPVAY) 0.1 MG TB12 ER tablet Take 0.1 mg by mouth 2 (two) times daily.  Marland Kitchen EPINEPHrine (EPIPEN 2-PAK) 0.3 mg/0.3 mL IJ SOAJ injection Inject 0.3 mg into the muscle once as needed (for severe allergic reaction).  . famotidine (PEPCID) 20 MG tablet Take 1 tablet (20 mg total) by mouth 2 (two) times daily. (Patient taking differently: Take 20 mg by mouth daily. )  . HYDROcodone-acetaminophen (NORCO) 5-325 MG tablet Take 1-2 tablets by mouth every 6 (six) hours as needed.  . lactulose (CHRONULAC) 10 GM/15ML solution Take 30 mLs (20 g total) by mouth daily as needed for mild constipation.  Marland Kitchen loperamide (IMODIUM) 2 MG capsule Take 1 capsule (2 mg total) by mouth 4 (four) times daily as needed for diarrhea or loose stools.  Marland Kitchen loratadine (CLARITIN) 10 MG tablet Take 1 tablet (10 mg total) by mouth daily.  . metoCLOPramide (REGLAN) 10 MG tablet Take 1 tablet (10 mg total) by mouth every 6 (six) hours as needed for nausea (nausea/headache).  . montelukast (SINGULAIR) 10 MG tablet TAKE 1 TABLET BY MOUTH EVERY NIGHT AT BEDTIME  . ondansetron (ZOFRAN ODT) 8 MG disintegrating tablet 93m ODT q4 hours prn nausea  . Oxcarbazepine (TRILEPTAL) 300 MG tablet Take one tablet by mouth twice daily/as directed per prescriber  . paliperidone (INVEGA) 3 MG 24 hr tablet Take 3 mg by  mouth daily.  . predniSONE (DELTASONE) 10 MG tablet 4 po daily (Patient taking differently: Take 2 mg by mouth daily. )  . Respiratory Therapy Supplies (NEBULIZER COMPRESSOR) KIT And supplies  . sertraline (ZOLOFT) 100 MG tablet Take 1 tablet (100 mg total) by mouth daily.  . diphenoxylate-atropine (LOMOTIL) 2.5-0.025 MG tablet Take 1 tablet by mouth 4 (four) times daily as needed for diarrhea or loose stools.   No facility-administered encounter medications on file as of 05/05/2017.     Allergies  Allergen Reactions    . Nsaids Other (See Comments)    Should not take due to GI condition  . Pineapple Swelling and Other (See Comments)    Reaction:  Lip swelling  . Remicade [Infliximab] Other (See Comments)    AUTOIMMUNE HEPATITIS  . Strawberry Extract Swelling    Reaction:  Lip swelling  . Amoxicillin-Pot Clavulanate Nausea And Vomiting and Other (See Comments)    GI intolerance   . Omeprazole Nausea And Vomiting  . Tomato Rash    Review of Systems  Constitutional: Positive for activity change, appetite change and fatigue. Negative for chills, fever and unexpected weight change.  HENT: Negative for congestion, dental problem and sore throat.   Eyes: Negative for photophobia and visual disturbance.  Respiratory: Negative for cough and shortness of breath.   Cardiovascular: Negative for chest pain and palpitations.  Gastrointestinal: Positive for abdominal pain, diarrhea, nausea and vomiting. Negative for blood in stool and constipation.  Genitourinary: Negative for difficulty urinating and frequency.  Neurological: Negative for dizziness and headaches.  Psychiatric/Behavioral:       Sleeping more than usual    BP 124/88 (BP Location: Right Arm, Patient Position: Sitting, Cuff Size: Large)   Pulse 92   Temp (!) 97.2 F (36.2 C) (Temporal)   Resp 20   Ht 5' 8"  (1.727 m)   Wt 237 lb 1.3 oz (107.5 kg)   SpO2 98%   BMI 36.05 kg/m   Physical Exam  Constitutional: He appears well-developed and well-nourished. No distress.  Obese.  Tired.  HENT:  Head: Normocephalic and atraumatic.  Right Ear: External ear normal.  Left Ear: External ear normal.  Mouth/Throat: Oropharynx is clear and moist.  Eyes: Conjunctivae are normal. Pupils are equal, round, and reactive to light.  Neck: Normal range of motion.  Cardiovascular: Normal rate, regular rhythm and normal heart sounds.  Pulmonary/Chest: Effort normal and breath sounds normal.  Abdominal: Soft. Normal appearance. Bowel sounds are increased.  There is no tenderness. There is no rebound and no guarding.  Neurological: He is alert.  Arousable  Psychiatric: He has a normal mood and affect. His behavior is normal.    ASSESSMENT/PLAN:  1. Diarrhea of presumed infectious origin Concern for C. difficile. - BASIC METABOLIC PANEL WITH GFR - CBC with Differential/Platelet  2. Ulcerative pancolitis with rectal bleeding (HCC) No current bleeding. - BASIC METABOLIC PANEL WITH GFR - CBC with Differential/Platelet   Patient Instructions  Need blood testing and stool test for C diff today Continue to push fluids May take lomotil for diarrhea Bland diet Rest I will send you a letter with your test results.  If there is anything of concern, we will call right away.   Diarrhea instructions given     Raylene Everts, MD

## 2017-05-06 LAB — LIPID PANEL
Cholesterol: 153 mg/dL (ref <200)
HDL: 37 mg/dL — ABNORMAL LOW (ref >40)
LDL Cholesterol (Calc): 90 mg/dL (calc)
Non-HDL Cholesterol (Calc): 116 mg/dL (calc) (ref <130)
Total CHOL/HDL Ratio: 4.1 (calc) (ref <5.0)
Triglycerides: 164 mg/dL — ABNORMAL HIGH (ref <150)

## 2017-05-06 LAB — HEMOGLOBIN A1C
Hgb A1c MFr Bld: 6.1 % of total Hgb — ABNORMAL HIGH (ref <5.7)
Mean Plasma Glucose: 128 (calc)
eAG (mmol/L): 7.1 (calc)

## 2017-05-07 ENCOUNTER — Other Ambulatory Visit: Payer: Self-pay | Admitting: Family Medicine

## 2017-05-08 LAB — C. DIFFICILE GDH AND TOXIN A/B
GDH ANTIGEN: NOT DETECTED
MICRO NUMBER:: 81343315
SPECIMEN QUALITY:: ADEQUATE
TOXIN A AND B: NOT DETECTED

## 2017-05-11 ENCOUNTER — Ambulatory Visit
Admission: RE | Admit: 2017-05-11 | Discharge: 2017-05-11 | Disposition: A | Discharge status: Home / Self Care | Payer: MEDICAID

## 2017-05-11 DIAGNOSIS — K51811 Other ulcerative colitis with rectal bleeding: Principal | ICD-10-CM

## 2017-05-20 ENCOUNTER — Telehealth: Payer: Self-pay

## 2017-05-20 NOTE — Telephone Encounter (Signed)
Pt is on a  list of [facility] visitors. Pt was called to the number provided [phone]. No answer. Message was left stating who I am and calling on behalf of [facility], and to return phone call to [phone].  Will call pt back at another date and time.   Shambhavi Salley R. Hessie Diener, [place] [phone]

## 2017-05-23 ENCOUNTER — Encounter (HOSPITAL_COMMUNITY): Payer: Self-pay | Admitting: Emergency Medicine

## 2017-05-23 ENCOUNTER — Emergency Department (HOSPITAL_COMMUNITY): Payer: Medicaid Other

## 2017-05-23 ENCOUNTER — Emergency Department (HOSPITAL_COMMUNITY)
Admission: EM | Admit: 2017-05-23 | Discharge: 2017-05-23 | Disposition: A | Discharge status: Home / Self Care | Payer: Medicaid Other | Attending: Emergency Medicine | Admitting: Emergency Medicine

## 2017-05-23 DIAGNOSIS — F909 Attention-deficit hyperactivity disorder, unspecified type: Secondary | ICD-10-CM | POA: Insufficient documentation

## 2017-05-23 DIAGNOSIS — J45909 Unspecified asthma, uncomplicated: Secondary | ICD-10-CM | POA: Insufficient documentation

## 2017-05-23 DIAGNOSIS — Z79899 Other long term (current) drug therapy: Secondary | ICD-10-CM | POA: Diagnosis not present

## 2017-05-23 DIAGNOSIS — Z87891 Personal history of nicotine dependence: Secondary | ICD-10-CM | POA: Diagnosis not present

## 2017-05-23 DIAGNOSIS — R0789 Other chest pain: Secondary | ICD-10-CM

## 2017-05-23 LAB — CBC WITH DIFFERENTIAL/PLATELET
Basophils Absolute: 0 10*3/uL (ref 0.0–0.1)
Basophils Relative: 0 %
Eosinophils Absolute: 0.2 10*3/uL (ref 0.0–0.7)
Eosinophils Relative: 2 %
HCT: 37.1 % — ABNORMAL LOW (ref 39.0–52.0)
Hemoglobin: 12.1 g/dL — ABNORMAL LOW (ref 13.0–17.0)
Lymphocytes Relative: 34 %
Lymphs Abs: 2.8 10*3/uL (ref 0.7–4.0)
MCH: 25.9 pg — ABNORMAL LOW (ref 26.0–34.0)
MCHC: 32.6 g/dL (ref 30.0–36.0)
MCV: 79.4 fL (ref 78.0–100.0)
Monocytes Absolute: 0.6 10*3/uL (ref 0.1–1.0)
Monocytes Relative: 8 %
Neutro Abs: 4.6 10*3/uL (ref 1.7–7.7)
Neutrophils Relative %: 56 %
Platelets: 237 10*3/uL (ref 150–400)
RBC: 4.67 MIL/uL (ref 4.22–5.81)
RDW: 14.3 % (ref 11.5–15.5)
WBC: 8.2 10*3/uL (ref 4.0–10.5)

## 2017-05-23 LAB — COMPREHENSIVE METABOLIC PANEL
ALT: 33 U/L (ref 17–63)
AST: 38 U/L (ref 15–41)
Albumin: 3.7 g/dL (ref 3.5–5.0)
Alkaline Phosphatase: 84 U/L (ref 38–126)
Anion gap: 8 (ref 5–15)
BUN: <5 mg/dL — ABNORMAL LOW (ref 6–20)
CO2: 22 mmol/L (ref 22–32)
Calcium: 9.1 mg/dL (ref 8.9–10.3)
Chloride: 108 mmol/L (ref 101–111)
Creatinine, Ser: 0.83 mg/dL (ref 0.61–1.24)
GFR calc Af Amer: >60 mL/min (ref >60)
GFR calc non Af Amer: >60 mL/min (ref >60)
Glucose, Bld: 105 mg/dL — ABNORMAL HIGH (ref 65–99)
Potassium: 3.9 mmol/L (ref 3.5–5.1)
Sodium: 138 mmol/L (ref 135–145)
Total Bilirubin: 0.3 mg/dL (ref 0.3–1.2)
Total Protein: 7 g/dL (ref 6.5–8.1)

## 2017-05-23 LAB — D-DIMER, QUANTITATIVE (NOT AT ARMC): D-Dimer, Quant: <0.27 ug/mL-FEU (ref 0.00–0.50)

## 2017-05-23 LAB — AMMONIA: Ammonia: 36 umol/L — ABNORMAL HIGH (ref 9–35)

## 2017-05-23 LAB — LIPASE, BLOOD: Lipase: 34 U/L (ref 11–51)

## 2017-05-23 LAB — TROPONIN I: Troponin I: <0.03 ng/mL (ref <0.03)

## 2017-05-23 NOTE — ED Provider Notes (Signed)
[facility] EMERGENCY DEPARTMENT Provider Note   CSN: [id number] Arrival date & time: 05/23/17  4196     History   Chief Complaint Chief Complaint  Patient presents with  . Chest Pain    HPI Wayne Avila is a 26 y.o. male.  Patient's legal guardian his mother is with him.  Developmental delay.  Patient was on set chest pain upper sternum at 3:00 this morning.  Occurred right after using his albuterol treatment.  Sats here 99%.  No other complaints.  Patient states the pain gets worse when he breathes.  No nausea no vomiting.      Past Medical History:  Diagnosis Date  . ADHD (attention deficit hyperactivity disorder)   . Anxiety   . Asthma   . Autoimmune hepatitis ([place])   . Bipolar 1 disorder ([place])   . C. difficile colitis 03/22/2016  . Clostridium difficile colitis   . Depression   . GERD (gastroesophageal reflux disease)   . HOH (hard of hearing)   . Mental developmental delay   . Seizures ([place])   . Ulcerative colitis [facility])     Patient Active Problem List   Diagnosis Date Noted  . ADHD 01/09/2017  . Environmental allergies 01/09/2017  . Class 1 obesity due to excess calories with body mass index (BMI) of 34.0 to 34.9 in adult 01/09/2017  . Autoimmune hepatitis ([place]) 01/02/2017  . Asthma in adult, mild intermittent, uncomplicated 22/29/7989  . Anemia 12/31/2016  . Hyperammonemia ([place]) 12/31/2016  . Elevated LFTs 12/31/2016  . Abnormal thyroid function test 03/24/2016  . Ulcerative colitis ([place]) 12/29/2015  . Bipolar 1 disorder, manic, moderate ([place]) 11/09/2014  . Mental developmental delay 11/15/2012    Past Surgical History:  Procedure Laterality Date  . ADENOIDECTOMY    . BIOPSY N/A 12/07/2012   Procedure: GASTRIC BIOPSIES;  Surgeon: [name], MD;  Location: [facility] ORS;  Service: Endoscopy;  Laterality: N/A;  . BIOPSY N/A 08/30/2013   Procedure: BIOPSY;  Surgeon: [name], MD;  Location: [facility] ORS;  Service: Endoscopy;  Laterality: N/A;  right  colon,transverse colon, left colon, rectal biopsies  . BIOPSY  11/20/2015   Procedure: BIOPSY;  Surgeon: [name], MD;  Location: [facility] ENDO SUITE;  Service: Endoscopy;;  ileal, right colon biopsy, left colon, rectum  . BIOPSY  04/18/2016   Procedure: BIOPSY;  Surgeon: [name], MD;  Location: [facility] END[facility] SU[facility];  Service: Endoscopy;;  left descending colon biopsies  . COLONOSCOPY  11/20/2015   Dr. [name]: Severe erythema, edema, ulcers from the anal verge to 20 cm above the anal verge without mucosal sparing, remaining colon and terminal ileum appeared normal. Biopsies from the rectum revealed fulminant active chronic colitis consistent with IBD. Pathology from terminal ileum revealed intramucosal lymphoid aggregates. Remaining colon random biopsies with inactive chronic colitis  . COLONOSCOPY WITH PROPOFOL N/A 08/30/2013   QJJ:HERDEY mucosa in the terminal ileum/COLITIS/ MILD PROCTITIS. 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  Marland Kitchen COLONOSCOPY WITH PROPOFOL N/A 11/20/2015   Dr. 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 ESOPHAGOGASTRODUODENOSCOPY  11/20/2015   Dr. [name]: Normal exam, stomach biopsied and duodenal biopsy.. Benign biopsies.  . ESOPHAGOGASTRODUODENOSCOPY (EGD) WITH PROPOFOL N/A 12/07/2012   SLF:The mucosa of the esophagus appeared normal Non-erosive gastritis (inflammation) was found in the gastric antrum; multiple biopsies The duodenal mucosa showed no abnormalities in the bulb and  second portion of the duodenum  . ESOPHAGOGASTRODUODENOSCOPY (EGD) WITH PROPOFOL N/A 11/20/2015   Dr. [name]: normal with normal biopsies, negative H.pylori   . ESOPHAGOGASTRODUODENOSCOPY (EGD) WITH PROPOFOL N/A 04/18/2016   Procedure: ESOPHAGOGASTRODUODENOSCOPY (EGD) WITH PROPOFOL;  Surgeon: [name], MD;  Location: [facility];  Service: Endoscopy;   Laterality: N/A;  . FLEXIBLE SIGMOIDOSCOPY N/A 04/18/2016   Procedure: FLEXIBLE SIGMOIDOSCOPY;  Surgeon: [name], MD;  Location: [facility];  Service: Endoscopy;  Laterality: N/A;  . TONSILLECTOMY         Home Medications    Prior to Admission medications   Medication Sig Start Date End Date Taking? Authorizing Provider  lactulose (CHRONULAC) 10 GM/15ML solution Take by mouth. 01/02/17  Yes [provider]  loratadine (CLARITIN) 10 MG tablet Take 10 mg by mouth. 01/09/17  Yes [provider]  montelukast (SINGULAIR) 10 MG tablet Take 10 mg by mouth. 01/09/17  Yes [provider]  sertraline (ZOLOFT) 100 MG tablet Take 100 mg by mouth. 11/13/14  Yes [provider]  albuterol (PROVENTIL HFA) 108 (90 Base) MCG/ACT inhaler Inhale into the lungs.    [provider]  albuterol (PROVENTIL HFA;VENTOLIN HFA) 108 (90 Base) MCG/ACT inhaler Inhale 2 puffs into the lungs every 4 (four) hours as needed for wheezing or shortness of breath (and/or cough).    [provider]  albuterol (PROVENTIL) (2.5 MG/3ML) 0.083% nebulizer solution Inhale 3 mLs (2.5 mg total) into the lungs every 6 (six) hours as needed for wheezing or shortness of breath. 04/29/17   Raylene Everts, MD  cholecalciferol (VITAMIN D) 1000 units tablet Take 1 tablet (1,000 Units total) by mouth daily. START AFTER VITAMIN D 50,000 U TABLETS ARE COMPLETE 04/08/16   Fields, Marga Melnick, MD  cloNIDine HCl (KAPVAY) 0.1 MG TB12 ER tablet Take 0.1 mg by mouth 2 (two) times daily.    [provider]  cloNIDine HCl (KAPVAY) 0.1 MG TB12 ER tablet Take 0.1 mg by mouth at bedtime.    [provider]  diphenoxylate-atropine (LOMOTIL) 2.5-0.025 MG tablet Take 1 tablet by mouth 4 (four) times daily as needed for diarrhea or loose stools. 05/05/17   Raylene Everts, MD  EPINEPHrine (EPIPEN 2-PAK) 0.3 mg/0.3 mL IJ SOAJ injection Inject 0.3 mg into the muscle once as needed (for  severe allergic reaction).    [provider]  EPINEPHrine 0.3 mg/0.3 mL IJ SOAJ injection Inject 0.3 mg into the muscle.    [provider]  famotidine (PEPCID) 20 MG tablet Take 1 tablet (20 mg total) by mouth 2 (two) times daily. Patient taking differently: Take 20 mg by mouth daily.  02/21/17   Milton Ferguson, MD  HYDROcodone-acetaminophen (NORCO) 5-325 MG tablet Take 1-2 tablets by mouth every 6 (six) hours as needed. 05/04/17   Veryl Speak, MD  lactulose (CHRONULAC) 10 GM/15ML solution Take 30 mLs (20 g total) by mouth daily as needed for mild constipation. 01/02/17   Kathie Dike, MD  lidocaine (XYLOCAINE) 2 % solution 10 mL by Mouth route every three (3) hours as needed.    [provider]  loperamide (IMODIUM) 2 MG capsule Take 1 capsule (2 mg total) by mouth 4 (four) times daily as needed for diarrhea or loose stools. 03/19/17   Horton, Barbette Hair, MD  loratadine (CLARITIN) 10 MG tablet Take 1 tablet (10 mg total) by mouth daily. 01/09/17   Raylene Everts, MD  metoCLOPramide (REGLAN) 10 MG tablet Take 1 tablet (10  mg total) by mouth every 6 (six) hours as needed for nausea (nausea/headache). 12/23/16   Daleen Bo, MD  montelukast (SINGULAIR) 10 MG tablet TAKE 1 TABLET BY MOUTH EVERY NIGHT AT BEDTIME 03/12/17   Raylene Everts, MD  ondansetron (ZOFRAN ODT) 8 MG disintegrating tablet 69m ODT q4 hours prn nausea 05/04/17   DVeryl Speak MD  Oxcarbazepine (TRILEPTAL) 300 MG tablet Take one tablet by mouth twice daily/as directed per prescriber 01/06/17   [provider]  paliperidone (INVEGA) 3 MG 24 hr tablet Take 3 mg by mouth daily.    [provider]  predniSONE (DELTASONE) 10 MG tablet 4 po daily Patient taking differently: Take 2 mg by mouth daily.  01/02/17   Fields, SMarga Melnick MD  predniSONE (DELTASONE) 20 MG tablet 3TABS 1X A DAY X3DAYS,2TABS DAILY X3DAYS,1.5TABS DAILY X3 DAYS,1TAB DAILY X3DAYS, 1/2TAB X3 DAYS 04/21/17   [provider]  Respiratory Therapy Supplies (NEBULIZER COMPRESSOR) KIT And supplies 04/29/17   NRaylene Everts MD  sertraline (ZOLOFT) 100 MG tablet Take 1 tablet (100 mg total) by mouth daily. 11/13/14   DNiel Hummer NP    Family History Family History  Problem Relation Age of Onset  . Asthma Mother   . Ulcers Mother   . Bipolar disorder Mother   . ADD / ADHD Father   . Diabetes Maternal Grandmother   . Hypertension Maternal Grandmother   . Sleep apnea Maternal Grandmother   . Heart disease Maternal Grandmother   . Diabetes Maternal Grandfather   . Hypertension Maternal Grandfather   . Sleep apnea Maternal Grandfather   . Heart disease Maternal Grandfather   . Cancer Maternal Grandfather        prostate  . Cancer Maternal Aunt        Leukemia  . Colon cancer Neg Hx   . Liver disease Neg Hx     Social History Social History   Tobacco Use  . Smoking status: Former Smoker    Packs/day: 0.00    Years: 2.00    Pack years: 0.00    Last attempt to quit: 10/06/2012    Years since quitting: 4.6  . Smokeless tobacco: Never Used  . Tobacco comment: Never smoked cigarettes  Substance Use Topics  . Alcohol use: No    Alcohol/week: 0.0 oz    Comment: denies usage  . Drug use: No     Allergies   Nsaids; Pineapple; Remicade [infliximab]; Strawberry extract; Amoxicillin-pot clavulanate; Omeprazole; and Tomato   Review of Systems Review of Systems  Constitutional: Negative for fever.  HENT: Negative for congestion.   Eyes: Negative for redness.  Respiratory: Positive for wheezing. Negative for shortness of breath.   Cardiovascular: Positive for chest pain.  Gastrointestinal: Negative for abdominal pain, nausea and vomiting.  Genitourinary: Negative for dysuria and flank pain.  Musculoskeletal: Negative for back pain.  Skin: Negative for rash.  Neurological: Negative for numbness.  Hematological: Does not bruise/bleed easily.  Psychiatric/Behavioral: Negative for  confusion.     Physical Exam Updated Vital Signs BP 131/85   Pulse 77   Temp 98.7 F (37.1 C) (Oral)   Resp 18   Ht 1.74 m (5' 8.5")   Wt 104.8 kg (231 lb)   SpO2 96%   BMI 34.61 kg/m   Physical Exam  Constitutional: He is oriented to person, place, and time. He appears well-developed and well-nourished. No distress.  HENT:  Head: Normocephalic and atraumatic.  Mouth/Throat: Oropharynx is clear and moist.  Eyes: Conjunctivae and EOM are normal. Pupils are equal, round, and reactive to light.  Neck: Normal range of motion. Neck supple.  Cardiovascular: Normal rate, regular rhythm and normal heart sounds.  Pulmonary/Chest: Effort normal and breath sounds normal. He exhibits tenderness.  Reproducible tenderness and pain to palpation of the upper sternum.  Abdominal: Soft. Bowel sounds are normal.  Musculoskeletal: Normal range of motion. He exhibits no edema.  Neurological: He is alert and oriented to person, place, and time. No cranial nerve deficit or sensory deficit. He exhibits normal muscle tone. Coordination normal.  Skin: Skin is warm.  Nursing note and vitals reviewed.    ED Treatments / Results  Labs (all labs ordered are listed, but only abnormal results are displayed) Labs Reviewed  AMMONIA - Abnormal; Notable for the following components:      Result Value   Ammonia 36 (*)    All other components within normal limits  CBC WITH DIFFERENTIAL/PLATELET - Abnormal; Notable for the following components:   Hemoglobin 12.1 (*)    HCT 37.1 (*)    MCH 25.9 (*)    All other components within normal limits  COMPREHENSIVE METABOLIC PANEL - Abnormal; Notable for the following components:   Glucose, Bld 105 (*)    BUN <5 (*)    All other components within normal limits  LIPASE, BLOOD  TROPONIN I  D-DIMER, QUANTITATIVE (NOT AT [facility])    EKG  EKG Interpretation  Date/Time:  Saturday May 23 2017 08:01:28 EST Ventricular Rate:  80 PR Interval:    QRS  Duration: 91 QT Interval:  365 QTC Calculation: 421 R Axis:   19 Text Interpretation:  Sinus rhythm Borderline T wave abnormalities Confirmed by Fredia Sorrow [phone]) on 05/23/2017 8:06:55 AM       Radiology Dg Chest 2 View  Result Date: 05/23/2017 CLINICAL DATA:  26 year old male with chest pain this morning. EXAM: CHEST  2 VIEW COMPARISON:  04/22/2017 chest CT and earlier. FINDINGS: Lower lung volumes on the frontal view today, chronically somewhat low lung volumes overall. Mediastinal contours remain normal. Visualized tracheal air column is within normal limits. The lungs remain clear. No pneumothorax or pleural effusion. Negative visible bowel gas pattern. Stable visualized osseous structures. IMPRESSION: Low lung volumes.  No acute cardiopulmonary abnormality. Electronically Signed   By: Genevie Ann M.D.   On: 05/23/2017 08:16    Procedures Procedures (including critical care time)  Medications Ordered in ED Medications - No data to display   Initial Impression / Assessment and Plan / ED Course  I have reviewed the triage vital signs and the nursing notes.  Pertinent labs & imaging results that were available during my care of the patient were reviewed by me and considered in my medical decision making (see chart for details).     Workup for the chest pain without any acute findings.  EKG without any significant abnormalities chest x-ray negative.  D-dimer not consistent with pulmonary embolus.  Troponin negative.  Suspect chest wall pain.  Patient stable for discharge home.  Final Clinical Impressions(s) / ED Diagnoses   Final diagnoses:  Chest wall pain    ED Discharge Orders    None       Fredia Sorrow, MD 05/23/17 1534

## 2017-05-23 NOTE — Discharge Instructions (Signed)
Workup for the chest pain without any acute findings.  Return for any new or worse symptoms.  Chest x-ray negative.  Screening test for blood clots was negative.  No evidence of acute cardiac problem.  Seems to be related to chest wall pain.

## 2017-05-23 NOTE — ED Triage Notes (Signed)
Pt states he took a breathing treatment and has had [facility], constant chest pain since that worsens with deep breaths, denies n/v

## 2017-06-03 ENCOUNTER — Emergency Department (HOSPITAL_COMMUNITY)
Admission: EM | Admit: 2017-06-03 | Discharge: 2017-06-03 | Disposition: A | Discharge status: Home / Self Care | Payer: Medicaid Other | Attending: Emergency Medicine | Admitting: Emergency Medicine

## 2017-06-03 ENCOUNTER — Encounter (HOSPITAL_COMMUNITY): Payer: Self-pay

## 2017-06-03 DIAGNOSIS — Y92000 Kitchen of unspecified non-institutional (private) residence as  the place of occurrence of the external cause: Secondary | ICD-10-CM | POA: Diagnosis not present

## 2017-06-03 DIAGNOSIS — Z79899 Other long term (current) drug therapy: Secondary | ICD-10-CM | POA: Diagnosis not present

## 2017-06-03 DIAGNOSIS — W228XXA Striking against or struck by other objects, initial encounter: Secondary | ICD-10-CM | POA: Insufficient documentation

## 2017-06-03 DIAGNOSIS — Y9389 Activity, other specified: Secondary | ICD-10-CM | POA: Insufficient documentation

## 2017-06-03 DIAGNOSIS — Z87891 Personal history of nicotine dependence: Secondary | ICD-10-CM | POA: Insufficient documentation

## 2017-06-03 DIAGNOSIS — S3093XA Unspecified superficial injury of penis, initial encounter: Secondary | ICD-10-CM | POA: Diagnosis present

## 2017-06-03 DIAGNOSIS — Y999 Unspecified external cause status: Secondary | ICD-10-CM | POA: Diagnosis not present

## 2017-06-03 DIAGNOSIS — J45909 Unspecified asthma, uncomplicated: Secondary | ICD-10-CM | POA: Diagnosis not present

## 2017-06-03 DIAGNOSIS — S3021XA Contusion of penis, initial encounter: Secondary | ICD-10-CM | POA: Diagnosis not present

## 2017-06-03 LAB — URINALYSIS, ROUTINE W REFLEX MICROSCOPIC
Bilirubin Urine: NEGATIVE
Glucose, UA: NEGATIVE mg/dL
Hgb urine dipstick: NEGATIVE
Ketones, ur: NEGATIVE mg/dL
Leukocytes, UA: NEGATIVE
Nitrite: NEGATIVE
Protein, ur: NEGATIVE mg/dL
Specific Gravity, Urine: 1.018 (ref 1.005–1.030)
pH: 5 (ref 5.0–8.0)

## 2017-06-03 NOTE — ED Provider Notes (Signed)
[facility] Provider Note   CSN: [id number] Arrival date & time: 06/03/17  1937     History   Chief Complaint Chief Complaint  Patient presents with  . Groin Swelling    HPI [facility] is a 26 y.o. male.  HPI  The patient has a history of some mental delay, he lives with his mother, according to the report from the mother and the patient he was trying to open the fridge door, he felt like it was stuck, he pulled very hard and as the door swung open and struck him in the penis.  He has not tried to urinate since that time, he feels like he has pain and swelling of the penis.  No other injuries, this occurred just prior to arrival, the symptoms are persistent, worse with palpation, not associated with any bleeding.  Past Medical History:  Diagnosis Date  . ADHD (attention deficit hyperactivity disorder)   . Anxiety   . Asthma   . Autoimmune hepatitis ([place])   . Bipolar 1 disorder ([place])   . C. difficile colitis 03/22/2016  . Clostridium difficile colitis   . Depression   . GERD (gastroesophageal reflux disease)   . HOH (hard of hearing)   . Mental developmental delay   . Seizures ([place])   . Ulcerative colitis [facility])     Patient Active Problem List   Diagnosis Date Noted  . ADHD 01/09/2017  . Environmental allergies 01/09/2017  . Class 1 obesity due to excess calories with body mass index (BMI) of 34.0 to 34.9 in adult 01/09/2017  . Autoimmune hepatitis ([place]) 01/02/2017  . Asthma in adult, mild intermittent, uncomplicated 71/24/5809  . Anemia 12/31/2016  . Hyperammonemia ([place]) 12/31/2016  . Elevated LFTs 12/31/2016  . Abnormal thyroid function test 03/24/2016  . Ulcerative colitis ([place]) 12/29/2015  . Bipolar 1 disorder, manic, moderate ([place]) 11/09/2014  . Mental developmental delay 11/15/2012    Past Surgical History:  Procedure Laterality Date  . ADENOIDECTOMY    . BIOPSY N/A 12/07/2012   Procedure: GASTRIC BIOPSIES;  Surgeon: [name], MD;  Location: [facility] ORS;  Service: Endoscopy;  Laterality: N/A;  . BIOPSY N/A 08/30/2013   Procedure: BIOPSY;  Surgeon: [name], MD;  Location: [facility];  Service: Endoscopy;  Laterality: N/A;  right colon,transverse colon, left colon, rectal biopsies  . BIOPSY  11/20/2015   Procedure: BIOPSY;  Surgeon: [name], MD;  Location: [facility] END[facility] SU[facility];  Service: Endoscopy;;  ileal, right colon biopsy, left colon, rectum  . BIOPSY  04/18/2016   Procedure: BIOPSY;  Surgeon: [name], MD;  Location: [facility];  Service: Endoscopy;;  left descending colon biopsies  . COLONOSCOPY  11/20/2015   Dr. [name]: Severe erythema, edema, ulcers from the anal verge to 20 cm above the anal verge without mucosal sparing, remaining colon and terminal ileum appeared normal. Biopsies from the rectum revealed fulminant active chronic colitis consistent with IBD. Pathology from terminal ileum revealed intramucosal lymphoid aggregates. Remaining colon random biopsies with inactive chronic colitis  . COLONOSCOPY WITH PROPOFOL N/A 08/30/2013   XIP:JASNKN mucosa in the terminal ileum/COLITIS/ MILD PROCTITIS. 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  Marland Kitchen COLONOSCOPY WITH PROPOFOL N/A 11/20/2015   Dr. 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 ESOPHAGOGASTRODUODENOSCOPY  11/20/2015   Dr. [name]: Normal exam, stomach biopsied and duodenal biopsy.Marland Kitchen  Benign biopsies.  . ESOPHAGOGASTRODUODENOSCOPY (EGD) WITH PROPOFOL N/A 12/07/2012   SLF:The mucosa of the esophagus appeared normal Non-erosive gastritis (inflammation) was found in the gastric antrum; multiple biopsies The duodenal mucosa showed no abnormalities in the bulb and second portion of the duodenum  . ESOPHAGOGASTRODUODENOSCOPY (EGD) WITH PROPOFOL N/A 11/20/2015   Dr. [name]: normal with normal biopsies, negative  H.pylori   . ESOPHAGOGASTRODUODENOSCOPY (EGD) WITH PROPOFOL N/A 04/18/2016   Procedure: ESOPHAGOGASTRODUODENOSCOPY (EGD) WITH PROPOFOL;  Surgeon: [name], MD;  Location[facility];  Service: Endoscopy;  Laterality: N/A;  . FLEXIBLE SIGMOIDOSCOPY N/A 04/18/2016   Procedure: FLEXIBLE SIGMOIDOSCOPY;  Surgeon: [name], MD;  Location: [facility];  Service: Endoscopy;  Laterality: N/A;  . TONSILLECTOMY         Home Medications    Prior to Admission medications   Medication Sig Start Date End Date Taking? Authorizing Provider  albuterol (PROVENTIL HFA) 108 (90 Base) MCG/ACT inhaler Inhale into the lungs.    [provider]  albuterol (PROVENTIL HFA;VENTOLIN HFA) 108 (90 Base) MCG/ACT inhaler Inhale 2 puffs into the lungs every 4 (four) hours as needed for wheezing or shortness of breath (and/or cough).    [provider]  albuterol (PROVENTIL) (2.5 MG/3ML) 0.083% nebulizer solution Inhale 3 mLs (2.5 mg total) into the lungs every 6 (six) hours as needed for wheezing or shortness of breath. 04/29/17   Raylene Everts, MD  cholecalciferol (VITAMIN D) 1000 units tablet Take 1 tablet (1,000 Units total) by mouth daily. START AFTER VITAMIN D 50,000 U TABLETS ARE COMPLETE 04/08/16   Fields, Marga Melnick, MD  cloNIDine HCl (KAPVAY) 0.1 MG TB12 ER tablet Take 0.1 mg by mouth 2 (two) times daily.    [provider]  cloNIDine HCl (KAPVAY) 0.1 MG TB12 ER tablet Take 0.1 mg by mouth at bedtime.    [provider]  diphenoxylate-atropine (LOMOTIL) 2.5-0.025 MG tablet Take 1 tablet by mouth 4 (four) times daily as needed for diarrhea or loose stools. 05/05/17   Raylene Everts, MD  EPINEPHrine (EPIPEN 2-PAK) 0.3 mg/0.3 mL IJ SOAJ injection Inject 0.3 mg into the muscle once as needed (for severe allergic reaction).    [provider]  EPINEPHrine 0.3 mg/0.3 mL IJ SOAJ injection Inject 0.3 mg into the muscle.    [provider]    famotidine (PEPCID) 20 MG tablet Take 1 tablet (20 mg total) by mouth 2 (two) times daily. Patient taking differently: Take 20 mg by mouth daily.  02/21/17   Milton Ferguson, MD  HYDROcodone-acetaminophen (NORCO) 5-325 MG tablet Take 1-2 tablets by mouth every 6 (six) hours as needed. 05/04/17   Veryl Speak, MD  lactulose (CHRONULAC) 10 GM/15ML solution Take 30 mLs (20 g total) by mouth daily as needed for mild constipation. 01/02/17   Kathie Dike, MD  lactulose (CHRONULAC) 10 GM/15ML solution Take by mouth. 01/02/17   [provider]  lidocaine (XYLOCAINE) 2 % solution 10 mL by Mouth route every three (3) hours as needed.    [provider]  loperamide (IMODIUM) 2 MG capsule Take 1 capsule (2 mg total) by mouth 4 (four) times daily as needed for diarrhea or loose stools. 03/19/17   Horton, Barbette Hair, MD  loratadine (CLARITIN) 10 MG tablet Take 1 tablet (10 mg total) by mouth daily. 01/09/17   Raylene Everts, MD  loratadine (CLARITIN) 10 MG tablet Take 10 mg by mouth. 01/09/17   [provider]  metoCLOPramide (REGLAN)  10 MG tablet Take 1 tablet (10 mg total) by mouth every 6 (six) hours as needed for nausea (nausea/headache). 12/23/16   Daleen Bo, MD  montelukast (SINGULAIR) 10 MG tablet TAKE 1 TABLET BY MOUTH EVERY NIGHT AT BEDTIME 03/12/17   Raylene Everts, MD  montelukast (SINGULAIR) 10 MG tablet Take 10 mg by mouth. 01/09/17   [provider]  ondansetron (ZOFRAN ODT) 8 MG disintegrating tablet 74m ODT q4 hours prn nausea 05/04/17   DVeryl Speak MD  Oxcarbazepine (TRILEPTAL) 300 MG tablet Take one tablet by mouth twice daily/as directed per prescriber 01/06/17   [provider]  paliperidone (INVEGA) 3 MG 24 hr tablet Take 3 mg by mouth daily.    [provider]  predniSONE (DELTASONE) 10 MG tablet 4 po daily Patient taking differently: Take 2 mg by mouth daily.  01/02/17   Fields, SMarga Melnick MD  predniSONE (DELTASONE) 20 MG tablet  3TABS 1X A DAY X3DAYS,2TABS DAILY X3DAYS,1.5TABS DAILY X3 DAYS,1TAB DAILY X3DAYS, 1/2TAB X3 DAYS 04/21/17   [provider]  Respiratory Therapy Supplies (NEBULIZER COMPRESSOR) KIT And supplies 04/29/17   NRaylene Everts MD  sertraline (ZOLOFT) 100 MG tablet Take 1 tablet (100 mg total) by mouth daily. 11/13/14   DNiel Hummer NP  sertraline (ZOLOFT) 100 MG tablet Take 100 mg by mouth. 11/13/14   [provider]    Family History Family History  Problem Relation Age of Onset  . Asthma Mother   . Ulcers Mother   . Bipolar disorder Mother   . ADD / ADHD Father   . Diabetes Maternal Grandmother   . Hypertension Maternal Grandmother   . Sleep apnea Maternal Grandmother   . Heart disease Maternal Grandmother   . Diabetes Maternal Grandfather   . Hypertension Maternal Grandfather   . Sleep apnea Maternal Grandfather   . Heart disease Maternal Grandfather   . Cancer Maternal Grandfather        prostate  . Cancer Maternal Aunt        Leukemia  . Colon cancer Neg Hx   . Liver disease Neg Hx     Social History Social History   Tobacco Use  . Smoking status: Former Smoker    Packs/day: 0.00    Years: 2.00    Pack years: 0.00    Last attempt to quit: 10/06/2012    Years since quitting: 4.6  . Smokeless tobacco: Never Used  . Tobacco comment: Never smoked cigarettes  Substance Use Topics  . Alcohol use: No    Alcohol/week: 0.0 oz    Comment: denies usage  . Drug use: No     Allergies   Nsaids; Pineapple; Remicade [infliximab]; Strawberry extract; Amoxicillin-pot clavulanate; Omeprazole; and Tomato   Review of Systems Review of Systems  Unable to perform ROS: Psychiatric disorder     Physical Exam Updated Vital Signs BP (!) 122/96 (BP Location: Right Arm)   Pulse 93   Temp 98.7 F (37.1 C) (Oral)   Resp 16   Ht _0  (1.727 m)   Wt 104.8 kg (231 lb)   BMI 35.12 kg/m   Physical Exam  Constitutional: He appears well-developed and  well-nourished.  HENT:  Head: Normocephalic and atraumatic.  Eyes: Conjunctivae are normal. Right eye exhibits no discharge. Left eye exhibits no discharge.  Pulmonary/Chest: Effort normal. No respiratory distress.  Genitourinary:  Genitourinary Comments: The patient has no abdominal tenderness, no inguinal tenderness, no scrotal or testicular tenderness and all the structures appear  normal he has mild tenderness over the midshaft of the penis.  There is no obvious bruising swelling bleeding injuries or blood at the urethral meatus.  There is no signs of penile fracture, angulation or skin tear laceration or damage  Neurological: He is alert. Coordination normal.  Skin: Skin is warm and dry. No rash noted. He is not diaphoretic. No erythema.  Psychiatric: He has a normal mood and affect.  Nursing note and vitals reviewed.    ED Treatments / Results  Labs (all labs ordered are listed, but only abnormal results are displayed) Labs Reviewed  URINALYSIS, [place]     Radiology No results found.  Procedures Procedures (including critical care time)  Medications Ordered in ED Medications - No data to display   Initial Impression / Assessment and Plan / ED Course  I have reviewed the triage vital signs and the nursing notes.  Pertinent labs & imaging results that were available during my care of the patient were reviewed by me and considered in my medical decision making (see chart for details).     The patient will need a urinalysis to evaluate for hematuria, touch base with urology if positive, otherwise patient appears to have mild injury to the penis, likely can follow-up outpatient if he can urinate without hematuria  Urinalysis negative, outpatient urology referral given, patient was able to urinate without difficulty  Final Clinical Impressions(s) / ED Diagnoses   Final diagnoses:  Contusion of penis, initial encounter    ED Discharge Orders     None       [name], MD 06/03/17 2127

## 2017-06-03 NOTE — ED Triage Notes (Signed)
Patient states that he was opening the refrigerator door and it was stuck.  He was trying to open the door and it came back and hit him in the penis.  States that he is having severe pain and that his penis is swelling.  Incident happened around 45 minutes ago.

## 2017-06-03 NOTE — Discharge Instructions (Signed)
If you detect any blood in your urine or of increased amounts of pain or swelling return for an emergency department visit.  Otherwise I would encourage you to make a follow-up appointment with the urologist office listed above.  You should be seen within 48 hours.  If you cannot be seen at the urologist you  may be seen at the family doctor.

## 2017-06-12 ENCOUNTER — Other Ambulatory Visit: Payer: Self-pay

## 2017-06-12 ENCOUNTER — Encounter (HOSPITAL_COMMUNITY): Payer: Self-pay | Admitting: *Deleted

## 2017-06-12 DIAGNOSIS — Z79899 Other long term (current) drug therapy: Secondary | ICD-10-CM | POA: Diagnosis not present

## 2017-06-12 DIAGNOSIS — Z87891 Personal history of nicotine dependence: Secondary | ICD-10-CM | POA: Insufficient documentation

## 2017-06-12 DIAGNOSIS — J45909 Unspecified asthma, uncomplicated: Secondary | ICD-10-CM | POA: Diagnosis not present

## 2017-06-12 DIAGNOSIS — K51911 Ulcerative colitis, unspecified with rectal bleeding: Secondary | ICD-10-CM | POA: Diagnosis not present

## 2017-06-12 DIAGNOSIS — K625 Hemorrhage of anus and rectum: Secondary | ICD-10-CM | POA: Diagnosis present

## 2017-06-12 LAB — BASIC METABOLIC PANEL
Anion gap: 11 (ref 5–15)
BUN: 14 mg/dL (ref 6–20)
CO2: 20 mmol/L — ABNORMAL LOW (ref 22–32)
Calcium: 9.9 mg/dL (ref 8.9–10.3)
Chloride: 107 mmol/L (ref 101–111)
Creatinine, Ser: 1.08 mg/dL (ref 0.61–1.24)
GFR calc Af Amer: >60 mL/min (ref >60)
GFR calc non Af Amer: >60 mL/min (ref >60)
Glucose, Bld: 99 mg/dL (ref 65–99)
Potassium: 4.4 mmol/L (ref 3.5–5.1)
Sodium: 138 mmol/L (ref 135–145)

## 2017-06-12 LAB — CBC
HCT: 40.6 % (ref 39.0–52.0)
Hemoglobin: 13.3 g/dL (ref 13.0–17.0)
MCH: 25.8 pg — ABNORMAL LOW (ref 26.0–34.0)
MCHC: 32.8 g/dL (ref 30.0–36.0)
MCV: 78.7 fL (ref 78.0–100.0)
Platelets: 307 10*3/uL (ref 150–400)
RBC: 5.16 MIL/uL (ref 4.22–5.81)
RDW: 15.1 % (ref 11.5–15.5)
WBC: 9.3 10*3/uL (ref 4.0–10.5)

## 2017-06-12 LAB — LIPASE, BLOOD: Lipase: 43 U/L (ref 11–51)

## 2017-06-12 NOTE — ED Triage Notes (Signed)
Per guardian, pt has had diarrhea with bright red blood, nausea, and right flank pain.

## 2017-06-13 ENCOUNTER — Emergency Department (HOSPITAL_COMMUNITY)
Admission: EM | Admit: 2017-06-13 | Discharge: 2017-06-13 | Disposition: A | Discharge status: Home / Self Care | Payer: Medicaid Other | Attending: Emergency Medicine | Admitting: Emergency Medicine

## 2017-06-13 DIAGNOSIS — K625 Hemorrhage of anus and rectum: Secondary | ICD-10-CM

## 2017-06-13 DIAGNOSIS — K51911 Ulcerative colitis, unspecified with rectal bleeding: Secondary | ICD-10-CM

## 2017-06-13 LAB — URINALYSIS, ROUTINE W REFLEX MICROSCOPIC
Bilirubin Urine: NEGATIVE
Glucose, UA: NEGATIVE mg/dL
Hgb urine dipstick: NEGATIVE
Ketones, ur: NEGATIVE mg/dL
Leukocytes, UA: NEGATIVE
Nitrite: NEGATIVE
Protein, ur: NEGATIVE mg/dL
Specific Gravity, Urine: 1.017 (ref 1.005–1.030)
pH: 5 (ref 5.0–8.0)

## 2017-06-13 LAB — POC OCCULT BLOOD, ED: Fecal Occult Bld: POSITIVE — AB

## 2017-06-13 MED ORDER — PREDNISONE 20 MG PO TABS: ORAL_TABLET | ORAL | 0 refills | Status: DC

## 2017-06-13 MED ORDER — METHYLPREDNISOLONE SODIUM SUCC 125 MG IJ SOLR
125.0000 mg | Freq: Once | INTRAMUSCULAR | Status: AC
  Administered 2017-06-13: 125 mg via INTRAVENOUS
  Filled 2017-06-13: qty 2

## 2017-06-13 NOTE — ED Notes (Signed)
Pt states he noticed blood n his stool that started yesterday. Pt has hx of colitis.

## 2017-06-13 NOTE — ED Provider Notes (Signed)
[facility] EMERGENCY DEPARTMENT Provider Note   CSN: [id number] Arrival date & time: 06/12/17  2215  Time seen 03:40 AM    History   Chief Complaint Chief Complaint  Patient presents with  . Rectal Bleeding    HPI Wayne Avila is a 27 y.o. male.  HPI patient is a frequent ED visitor with abdominal complaints and rectal bleeding.  He has ulcerative colitis.  He is followed at [facility] and by Dr. [name].  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  He states he started seeing blood in his BMs on January 4.  He has had about 3 episodes.  He has had some mild right-sided abdominal pain which is typical with his ulcerative colitis.  He denies nausea, vomiting, or fever.  Mother verifies that when he has flareups they usually put him on oral steroids.  I seen this patient many times in the past and he does not appear to be nearly as distressed as he has been before with his episodes.  PCP Raylene Everts, MD GI Dr [name] and [facility] (Dr Lizbeth Bark)  Past Medical History:  Diagnosis Date  . ADHD (attention deficit hyperactivity disorder)   . Anxiety   . Asthma   . Autoimmune hepatitis ([place])   . Bipolar 1 disorder ([place])   . C. difficile colitis 03/22/2016  . Clostridium difficile colitis   . Depression   . GERD (gastroesophageal reflux disease)   . HOH (hard of hearing)   . Mental developmental delay   . Seizures ([place])   . Ulcerative colitis [facility])     Patient Active Problem List   Diagnosis Date Noted  . ADHD 01/09/2017  . Environmental allergies 01/09/2017  . Class 1 obesity due to excess calories with body mass index (BMI) of 34.0 to 34.9 in adult 01/09/2017  . Autoimmune hepatitis ([place]) 01/02/2017  . Asthma in adult, mild intermittent, uncomplicated 74/16/3845  . Anemia 12/31/2016  . Hyperammonemia ([place]) 12/31/2016  . Elevated LFTs 12/31/2016  . Abnormal  thyroid function test 03/24/2016  . Ulcerative colitis ([place]) 12/29/2015  . Bipolar 1 disorder, manic, moderate ([place]) 11/09/2014  . Mental developmental delay 11/15/2012    Past Surgical History:  Procedure Laterality Date  . ADENOIDECTOMY    . BIOPSY N/A 12/07/2012   Procedure: GASTRIC BIOPSIES;  Surgeon: [name], MD;  Location: [facility] ORS;  Service: Endoscopy;  Laterality: N/A;  . BIOPSY N/A 08/30/2013   Procedure: BIOPSY;  Surgeon: [name], MD;  Location: [facility] ORS;  Service: Endoscopy;  Laterality: N/A;  right colon,transverse colon, left colon, rectal biopsies  . BIOPSY  11/20/2015   Procedure: BIOPSY;  Surgeon: [name], MD;  Location: [facility] ENDO SUITE;  Service: Endoscopy;;  ileal, right colon biopsy, left colon, rectum  . BIOPSY  04/18/2016   Procedure: BIOPSY;  Surgeon: [name], MD;  Location: [facility] END[facility] SU[facility];  Service: Endoscopy;;  left descending colon biopsies  . COLONOSCOPY  11/20/2015   Dr. [name]: Severe erythema, edema, ulcers from the anal verge to 20 cm above the anal verge without mucosal sparing, remaining colon and terminal ileum appeared normal. Biopsies from the rectum revealed fulminant active chronic colitis consistent with IBD. Pathology from terminal ileum revealed intramucosal lymphoid aggregates. Remaining colon random biopsies with inactive chronic colitis  . COLONOSCOPY WITH PROPOFOL N/A 08/30/2013  WUG:[id number] mucosa in the terminal ileum/COLITIS/ MILD PROCTITIS. 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  Marland Kitchen COLONOSCOPY WITH PROPOFOL N/A 11/20/2015   Dr. 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 ESOPHAGOGASTRODUODENOSCOPY  11/20/2015   Dr. [name]: Normal exam, stomach biopsied and duodenal biopsy.. Benign biopsies.  . ESOPHAGOGASTRODUODENOSCOPY (EGD) WITH PROPOFOL N/A 12/07/2012   SLF:The mucosa of the  esophagus appeared normal Non-erosive gastritis (inflammation) was found in the gastric antrum; multiple biopsies The duodenal mucosa showed no abnormalities in the bulb and second portion of the duodenum  . ESOPHAGOGASTRODUODENOSCOPY (EGD) WITH PROPOFOL N/A 11/20/2015   Dr. [name]: normal with normal biopsies, negative H.pylori   . ESOPHAGOGASTRODUODENOSCOPY (EGD) WITH PROPOFOL N/A 04/18/2016   Procedure: ESOPHAGOGASTRODUODENOSCOPY (EGD) WITH PROPOFOL;  Surgeon: [name], MD;  Location[facility];  Service: Endoscopy;  Laterality: N/A;  . FLEXIBLE SIGMOIDOSCOPY N/A 04/18/2016   Procedure: FLEXIBLE SIGMOIDOSCOPY;  Surgeon: [name], MD;  Location: [facility];  Service: Endoscopy;  Laterality: N/A;  . TONSILLECTOMY         Home Medications    Prior to Admission medications   Medication Sig Start Date End Date Taking? Authorizing Provider  albuterol (PROVENTIL HFA) 108 (90 Base) MCG/ACT inhaler Inhale into the lungs.    [provider]  albuterol (PROVENTIL HFA;VENTOLIN HFA) 108 (90 Base) MCG/ACT inhaler Inhale 2 puffs into the lungs every 4 (four) hours as needed for wheezing or shortness of breath (and/or cough).    [provider]  albuterol (PROVENTIL) (2.5 MG/3ML) 0.083% nebulizer solution Inhale 3 mLs (2.5 mg total) into the lungs every 6 (six) hours as needed for wheezing or shortness of breath. 04/29/17   Raylene Everts, MD  cholecalciferol (VITAMIN D) 1000 units tablet Take 1 tablet (1,000 Units total) by mouth daily. START AFTER VITAMIN D 50,000 U TABLETS ARE COMPLETE 04/08/16   Fields, Marga Melnick, MD  cloNIDine HCl (KAPVAY) 0.1 MG TB12 ER tablet Take 0.1 mg by mouth 2 (two) times daily.    [provider]  cloNIDine HCl (KAPVAY) 0.1 MG TB12 ER tablet Take 0.1 mg by mouth at bedtime.    [provider]  diphenoxylate-atropine (LOMOTIL) 2.5-0.025 MG tablet Take 1 tablet by mouth 4 (four) times daily as needed for diarrhea or loose  stools. 05/05/17   Raylene Everts, MD  EPINEPHrine (EPIPEN 2-PAK) 0.3 mg/0.3 mL IJ SOAJ injection Inject 0.3 mg into the muscle once as needed (for severe allergic reaction).    [provider]  EPINEPHrine 0.3 mg/0.3 mL IJ SOAJ injection Inject 0.3 mg into the muscle.    [provider]  famotidine (PEPCID) 20 MG tablet Take 1 tablet (20 mg total) by mouth 2 (two) times daily. Patient taking differently: Take 20 mg by mouth daily.  02/21/17   Milton Ferguson, MD  HYDROcodone-acetaminophen (NORCO) 5-325 MG tablet Take 1-2 tablets by mouth every 6 (six) hours as needed. 05/04/17   Veryl Speak, MD  lactulose (CHRONULAC) 10 GM/15ML solution Take 30 mLs (20 g total) by mouth daily as needed for mild constipation. 01/02/17   Kathie Dike, MD  lactulose (CHRONULAC) 10 GM/15ML solution Take by mouth. 01/02/17   [provider]  lidocaine (XYLOCAINE) 2 % solution 10 mL by Mouth route every three (3) hours as needed.    [provider]  loperamide (IMODIUM) 2 MG capsule Take 1 capsule (2  mg total) by mouth 4 (four) times daily as needed for diarrhea or loose stools. 03/19/17   Horton, Barbette Hair, MD  loratadine (CLARITIN) 10 MG tablet Take 1 tablet (10 mg total) by mouth daily. 01/09/17   Raylene Everts, MD  loratadine (CLARITIN) 10 MG tablet Take 10 mg by mouth. 01/09/17   [provider]  metoCLOPramide (REGLAN) 10 MG tablet Take 1 tablet (10 mg total) by mouth every 6 (six) hours as needed for nausea (nausea/headache). 12/23/16   Daleen Bo, MD  montelukast (SINGULAIR) 10 MG tablet TAKE 1 TABLET BY MOUTH EVERY NIGHT AT BEDTIME 03/12/17   Raylene Everts, MD  montelukast (SINGULAIR) 10 MG tablet Take 10 mg by mouth. 01/09/17   [provider]  ondansetron (ZOFRAN ODT) 8 MG disintegrating tablet 68m ODT q4 hours prn nausea 05/04/17   DVeryl Speak MD  Oxcarbazepine (TRILEPTAL) 300 MG tablet Take one tablet by mouth twice daily/as directed per  prescriber 01/06/17   [provider]  paliperidone (INVEGA) 3 MG 24 hr tablet Take 3 mg by mouth daily.    [provider]  predniSONE (DELTASONE) 20 MG tablet Take 3 po QD x 3d , then 2 po QD x 3d then 1 po QD x 3d 06/13/17   KRolland Porter MD  Respiratory Therapy Supplies (NEBULIZER COMPRESSOR) KIT And supplies 04/29/17   NRaylene Everts MD  sertraline (ZOLOFT) 100 MG tablet Take 1 tablet (100 mg total) by mouth daily. 11/13/14   DNiel Hummer NP  sertraline (ZOLOFT) 100 MG tablet Take 100 mg by mouth. 11/13/14   [provider]    Family History Family History  Problem Relation Age of Onset  . Asthma Mother   . Ulcers Mother   . Bipolar disorder Mother   . ADD / ADHD Father   . Diabetes Maternal Grandmother   . Hypertension Maternal Grandmother   . Sleep apnea Maternal Grandmother   . Heart disease Maternal Grandmother   . Diabetes Maternal Grandfather   . Hypertension Maternal Grandfather   . Sleep apnea Maternal Grandfather   . Heart disease Maternal Grandfather   . Cancer Maternal Grandfather        prostate  . Cancer Maternal Aunt        Leukemia  . Colon cancer Neg Hx   . Liver disease Neg Hx     Social History Social History   Tobacco Use  . Smoking status: Former Smoker    Packs/day: 0.00    Years: 2.00    Pack years: 0.00    Last attempt to quit: 10/06/2012    Years since quitting: 4.6  . Smokeless tobacco: Never Used  . Tobacco comment: Never smoked cigarettes  Substance Use Topics  . Alcohol use: No    Alcohol/week: 0.0 oz    Comment: denies usage  . Drug use: No  on disability Lives with mother   Allergies   Nsaids; Pineapple; Remicade [infliximab]; Strawberry extract; Amoxicillin-pot clavulanate; Omeprazole; and Tomato   Review of Systems Review of Systems  All other systems reviewed and are negative.    Physical Exam Updated Vital Signs BP (!) 130/95   Pulse 82   Temp 99 F (37.2 C) (Oral)   Resp 18   Ht 5' 8"   (1.727 m)   Wt 114.3 kg (252 lb)   SpO2 98%   BMI 38.32 kg/m   Vital signs normal    Physical Exam  Constitutional: He is oriented to person, place,  and time. He appears well-developed and well-nourished.  Non-toxic appearance. He does not appear ill. No distress.  HENT:  Head: Normocephalic and atraumatic.  Right Ear: External ear normal.  Left Ear: External ear normal.  Nose: Nose normal. No mucosal edema or rhinorrhea.  Mouth/Throat: Oropharynx is clear and moist and mucous membranes are normal. No dental abscesses or uvula swelling.  Eyes: Conjunctivae and EOM are normal. Pupils are equal, round, and reactive to light.  Neck: Normal range of motion and full passive range of motion without pain. Neck supple.  Cardiovascular: Normal rate, regular rhythm and normal heart sounds. Exam reveals no gallop and no friction rub.  No murmur heard. Pulmonary/Chest: Effort normal and breath sounds normal. No respiratory distress. He has no wheezes. He has no rhonchi. He has no rales. He exhibits no tenderness and no crepitus.  Abdominal: Soft. Normal appearance and bowel sounds are normal. He exhibits no distension. There is tenderness in the right lower quadrant. There is no rebound and no guarding.  Mildly tender in the right lower quadrant has been much more painful in the past.  Musculoskeletal: Normal range of motion. He exhibits no edema or tenderness.  Moves all extremities well.   Neurological: He is alert and oriented to person, place, and time. He has normal strength. No cranial nerve deficit.  Skin: Skin is warm, dry and intact. No rash noted. No erythema. No pallor.  Psychiatric: He has a normal mood and affect. His speech is normal and behavior is normal. His mood appears not anxious.  Nursing note and vitals reviewed.    ED Treatments / Results  Labs (all labs ordered are listed, but only abnormal results are displayed) Results for orders placed or performed during the  hospital encounter of 06/13/17  CBC  Result Value Ref Range   WBC 9.3 4.0 - 10.5 K/uL   RBC 5.16 4.22 - 5.81 MIL/uL   Hemoglobin 13.3 13.0 - 17.0 g/dL   HCT 40.6 39.0 - 52.0 %   MCV 78.7 78.0 - 100.0 fL   MCH 25.8 (L) 26.0 - 34.0 pg   MCHC 32.8 30.0 - 36.0 g/dL   RDW 15.1 11.5 - 15.5 %   Platelets 307 150 - 400 K/uL  Urinalysis, Routine w reflex microscopic  Result Value Ref Range   Color, Urine YELLOW YELLOW   APPearance CLEAR CLEAR   Specific Gravity, Urine 1.017 1.005 - 1.030   pH 5.0 5.0 - 8.0   Glucose, UA NEGATIVE NEGATIVE mg/dL   Hgb urine dipstick NEGATIVE NEGATIVE   Bilirubin Urine NEGATIVE NEGATIVE   Ketones, ur NEGATIVE NEGATIVE mg/dL   Protein, ur NEGATIVE NEGATIVE mg/dL   Nitrite NEGATIVE NEGATIVE   Leukocytes, UA NEGATIVE NEGATIVE  Basic metabolic panel  Result Value Ref Range   Sodium 138 135 - 145 mmol/L   Potassium 4.4 3.5 - 5.1 mmol/L   Chloride 107 101 - 111 mmol/L   CO2 20 (L) 22 - 32 mmol/L   Glucose, Bld 99 65 - 99 mg/dL   BUN 14 6 - 20 mg/dL   Creatinine, Ser 1.08 0.61 - 1.24 mg/dL   Calcium 9.9 8.9 - 10.3 mg/dL   GFR calc non Af Amer >60 >60 mL/min   GFR calc Af Amer >60 >60 mL/min   Anion gap 11 5 - 15  Lipase, blood  Result Value Ref Range   Lipase 43 11 - 51 U/L  POC occult blood, ED RN will collect  Result Value Ref Range  Fecal Occult Bld POSITIVE (A) NEGATIVE   Laboratory interpretation all normal except positive Hemoccult which is expected, he does not have anemia    EKG  EKG Interpretation None       Radiology No results found.  Procedures Procedures (including critical care time)  Medications Ordered in ED Medications  methylPREDNISolone sodium succinate (SOLU-MEDROL) 125 mg/2 mL injection 125 mg (125 mg Intravenous Given 06/13/17 0356)     Initial Impression / Assessment and Plan / ED Course  I have reviewed the triage vital signs and the nursing notes.  Pertinent labs & imaging results that were available during  my care of the patient were reviewed by me and considered in my medical decision making (see chart for details).     Patient presents with a very mild flareup of his ulcerative colitis.  He was given a dose of IV steroids in the ED.  It was felt he could go home on oral steroids and follow-up with his gastroenterologist as needed.  Final Clinical Impressions(s) / ED Diagnoses   Final diagnoses:  Rectal bleeding  Ulcerative colitis with rectal bleeding, unspecified location [facility])    ED Discharge Orders        Ordered    predniSONE (DELTASONE) 20 MG tablet     06/13/17 0435      Plan discharge  Rolland Porter, MD, Barbette Or, MD 06/13/17 [phone]

## 2017-06-13 NOTE — Discharge Instructions (Signed)
Take the prednisone until gone.  Go on a liquid diet today and tomorrow and then start back on a bland diet as you feel better.  If your abdominal pain gets worse, you have heavy rectal bleeding, you start feeling lightheaded or weak, you should be reevaluated in the emergency department.  Call your gastroenterologist at [facility] or Dr. [name] to let them know about this current flareup.

## 2017-06-17 ENCOUNTER — Telehealth: Payer: Self-pay

## 2017-06-17 NOTE — Telephone Encounter (Signed)
Pt is on a  list of Top ED visitors. Second call attempt was made today 06/17/17 to the number provided [phone]. No answer. Message was left.  Will call pt back at another date and time.   Mehdi Gironda R. [place], LPN [phone]

## 2017-06-17 NOTE — Telephone Encounter (Signed)
Pt is on a list of Top ED visitors. Pt returned phone call from message left earlier today. Pt states he resides in [place]. Pt states he has medicaid and has a PCP, Dr. Meda Coffee. Pt states he has received his flu shot. Pt states he resides with his family and has a permanent place to live. Pt states he doesn't have transportation issues. Pt states he feel physically and emotionally safe. Pt states he is up to date with his  Medications. Pt states he gets food stamps but still has food insecurities. Pt was refered the [employer] in [place]. Contact information was given, as well as days and times for the food pantry. Advised pt to give [place] a call back if he had any questions or concerns.     Deletha Jaffee R. [place], LPN [phone]

## 2017-07-06 ENCOUNTER — Ambulatory Visit: Admit: 2017-07-06 | Discharge: 2017-07-07 | Payer: MEDICAID

## 2017-07-06 DIAGNOSIS — K51811 Other ulcerative colitis with rectal bleeding: Principal | ICD-10-CM

## 2017-07-07 ENCOUNTER — Ambulatory Visit: Admit: 2017-07-07 | Discharge: 2017-07-08 | Payer: MEDICAID | Attending: Internal Medicine | Primary: Internal Medicine

## 2017-07-07 DIAGNOSIS — K51811 Other ulcerative colitis with rectal bleeding: Principal | ICD-10-CM

## 2017-07-20 ENCOUNTER — Other Ambulatory Visit: Payer: Self-pay

## 2017-07-20 ENCOUNTER — Emergency Department (HOSPITAL_COMMUNITY)
Admission: EM | Admit: 2017-07-20 | Discharge: 2017-07-20 | Disposition: A | Discharge status: Home / Self Care | Payer: Medicaid Other | Attending: Emergency Medicine | Admitting: Emergency Medicine

## 2017-07-20 ENCOUNTER — Encounter (HOSPITAL_COMMUNITY): Payer: Self-pay | Admitting: *Deleted

## 2017-07-20 DIAGNOSIS — J111 Influenza due to unidentified influenza virus with other respiratory manifestations: Secondary | ICD-10-CM

## 2017-07-20 DIAGNOSIS — J45909 Unspecified asthma, uncomplicated: Secondary | ICD-10-CM | POA: Diagnosis not present

## 2017-07-20 DIAGNOSIS — Z87891 Personal history of nicotine dependence: Secondary | ICD-10-CM | POA: Insufficient documentation

## 2017-07-20 DIAGNOSIS — R05 Cough: Secondary | ICD-10-CM | POA: Diagnosis present

## 2017-07-20 DIAGNOSIS — Z79899 Other long term (current) drug therapy: Secondary | ICD-10-CM | POA: Insufficient documentation

## 2017-07-20 DIAGNOSIS — R69 Illness, unspecified: Secondary | ICD-10-CM

## 2017-07-20 LAB — INFLUENZA PANEL BY PCR (TYPE A & B)
Influenza A By PCR: NEGATIVE
Influenza B By PCR: NEGATIVE

## 2017-07-20 MED ORDER — BENZONATATE 100 MG PO CAPS: 200.0000 mg | ORAL_CAPSULE | Freq: Three times a day (TID) | ORAL | 0 refills | Status: DC | PRN

## 2017-07-20 NOTE — ED Provider Notes (Signed)
[facility] EMERGENCY DEPARTMENT Provider Note   CSN: [id number] Arrival date & time: 07/20/17  1657     History   Chief Complaint Chief Complaint  Patient presents with  . Cough    HPI Wayne Avila is a 27 y.o. male with a history of asthma, autoimmune hepatitis, colitis, GERD and seizure disorder presenting with a 24-hour history of flulike symptoms including nonproductive cough, myalgias and fatigue.  He has also had some sneezing and mild rhinorrhea.  He denies wheezing, shortness of breath, abdominal pain, nausea vomiting or diarrhea.  He has had no known exposures to influenza, but his aunt with whom he lives is also here for evaluation of similar symptoms.  Mother at the bedside states that he slept the entire day yesterday and then again today until 3 PM.  He has had no medications prior to arrival.  The history is provided by the patient.    Past Medical History:  Diagnosis Date  . ADHD (attention deficit hyperactivity disorder)   . Anxiety   . Asthma   . Autoimmune hepatitis ([place])   . Bipolar 1 disorder ([place])   . C. difficile colitis 03/22/2016  . Clostridium difficile colitis   . Depression   . GERD (gastroesophageal reflux disease)   . HOH (hard of hearing)   . Mental developmental delay   . Seizures ([place])   . Ulcerative colitis [facility])     Patient Active Problem List   Diagnosis Date Noted  . ADHD 01/09/2017  . Environmental allergies 01/09/2017  . Class 1 obesity due to excess calories with body mass index (BMI) of 34.0 to 34.9 in adult 01/09/2017  . Autoimmune hepatitis ([place]) 01/02/2017  . Asthma in adult, mild intermittent, uncomplicated 01/00/7121  . Anemia 12/31/2016  . Hyperammonemia ([place]) 12/31/2016  . Elevated LFTs 12/31/2016  . Abnormal thyroid function test 03/24/2016  . Ulcerative colitis ([place]) 12/29/2015  . Bipolar 1 disorder, manic, moderate ([place]) 11/09/2014  . Mental developmental delay 11/15/2012    Past Surgical History:  Procedure  Laterality Date  . ADENOIDECTOMY    . BIOPSY N/A 12/07/2012   Procedure: GASTRIC BIOPSIES;  Surgeon: [name], MD;  Location: [facility] ORS;  Service: Endoscopy;  Laterality: N/A;  . BIOPSY N/A 08/30/2013   Procedure: BIOPSY;  Surgeon: [name], MD;  Location: [facility] ORS;  Service: Endoscopy;  Laterality: N/A;  right colon,transverse colon, left colon, rectal biopsies  . BIOPSY  11/20/2015   Procedure: BIOPSY;  Surgeon: [name], MD;  Location: [facility] ENDO SUITE;  Service: Endoscopy;;  ileal, right colon biopsy, left colon, rectum  . BIOPSY  04/18/2016   Procedure: BIOPSY;  Surgeon: [name], MD;  Location: [facility] ENDO SUITE;  Service: Endoscopy;;  left descending colon biopsies  . COLONOSCOPY  11/20/2015   Dr. [name]: Severe erythema, edema, ulcers from the anal verge to 20 cm above the anal verge without mucosal sparing, remaining colon and terminal ileum appeared normal. Biopsies from the rectum revealed fulminant active chronic colitis consistent with IBD. Pathology from terminal ileum revealed intramucosal lymphoid aggregates. Remaining colon random biopsies with inactive chronic colitis  . COLONOSCOPY WITH PROPOFOL N/A 08/30/2013   FXJ:OITGPQ mucosa in the terminal ileum/COLITIS/ MILD PROCTITIS. 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  Marland Kitchen COLONOSCOPY WITH PROPOFOL N/A 11/20/2015   Dr. 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 ESOPHAGOGASTRODUODENOSCOPY  11/20/2015  Dr. [name]: Normal exam, stomach biopsied and duodenal biopsy.. Benign biopsies.  . ESOPHAGOGASTRODUODENOSCOPY (EGD) WITH PROPOFOL N/A 12/07/2012   SLF:The mucosa of the esophagus appeared normal Non-erosive gastritis (inflammation) was found in the gastric antrum; multiple biopsies The duodenal mucosa showed no abnormalities in the bulb and second portion of the duodenum  .  ESOPHAGOGASTRODUODENOSCOPY (EGD) WITH PROPOFOL N/A 11/20/2015   Dr. [name]: normal with normal biopsies, negative H.pylori   . ESOPHAGOGASTRODUODENOSCOPY (EGD) WITH PROPOFOL N/A 04/18/2016   Procedure: ESOPHAGOGASTRODUODENOSCOPY (EGD) WITH PROPOFOL;  Surgeon: [name], MD;  Location: [facility] ENDO SU[facility];  Service: Endoscopy;  Laterality: N/A;  . FLEXIBLE SIGMOIDOSCOPY N/A 04/18/2016   Procedure: FLEXIBLE SIGMOIDOSCOPY;  Surgeon: [name], MD;  Location: [facility] ENDO SUITE;  Service: Endoscopy;  Laterality: N/A;  . TONSILLECTOMY         Home Medications    Prior to Admission medications   Medication Sig Start Date End Date Taking? Authorizing Provider  albuterol (PROVENTIL HFA) 108 (90 Base) MCG/ACT inhaler Inhale into the lungs.    [provider]  albuterol (PROVENTIL HFA;VENTOLIN HFA) 108 (90 Base) MCG/ACT inhaler Inhale 2 puffs into the lungs every 4 (four) hours as needed for wheezing or shortness of breath (and/or cough).    [provider]  albuterol (PROVENTIL) (2.5 MG/3ML) 0.083% nebulizer solution Inhale 3 mLs (2.5 mg total) into the lungs every 6 (six) hours as needed for wheezing or shortness of breath. 04/29/17   Raylene Everts, MD  benzonatate (TESSALON) 100 MG capsule Take 2 capsules (200 mg total) by mouth 3 (three) times daily as needed. 07/20/17   Evalee Jefferson, PA-C  cholecalciferol (VITAMIN D) 1000 units tablet Take 1 tablet (1,000 Units total) by mouth daily. START AFTER VITAMIN D 50,000 U TABLETS ARE COMPLETE 04/08/16   Fields, Marga Melnick, MD  cloNIDine HCl (KAPVAY) 0.1 MG TB12 ER tablet Take 0.1 mg by mouth 2 (two) times daily.    [provider]  cloNIDine HCl (KAPVAY) 0.1 MG TB12 ER tablet Take 0.1 mg by mouth at bedtime.    [provider]  diphenoxylate-atropine (LOMOTIL) 2.5-0.025 MG tablet Take 1 tablet by mouth 4 (four) times daily as needed for diarrhea or loose stools. 05/05/17   Raylene Everts, MD  EPINEPHrine (EPIPEN  2-PAK) 0.3 mg/0.3 mL IJ SOAJ injection Inject 0.3 mg into the muscle once as needed (for severe allergic reaction).    [provider]  EPINEPHrine 0.3 mg/0.3 mL IJ SOAJ injection Inject 0.3 mg into the muscle.    [provider]  famotidine (PEPCID) 20 MG tablet Take 1 tablet (20 mg total) by mouth 2 (two) times daily. Patient taking differently: Take 20 mg by mouth daily.  02/21/17   Milton Ferguson, MD  HYDROcodone-acetaminophen (NORCO) 5-325 MG tablet Take 1-2 tablets by mouth every 6 (six) hours as needed. 05/04/17   Veryl Speak, MD  lactulose (CHRONULAC) 10 GM/15ML solution Take 30 mLs (20 g total) by mouth daily as needed for mild constipation. 01/02/17   Kathie Dike, MD  lactulose (CHRONULAC) 10 GM/15ML solution Take by mouth. 01/02/17   [provider]  lidocaine (XYLOCAINE) 2 % solution 10 mL by Mouth route every three (3) hours as needed.    [provider]  loperamide (IMODIUM) 2 MG capsule Take 1 capsule (2 mg total) by mouth 4 (four) times daily as needed for diarrhea or loose stools. 03/19/17   Horton, Barbette Hair, MD  loratadine (CLARITIN) 10 MG tablet Take 1  tablet (10 mg total) by mouth daily. 01/09/17   Raylene Everts, MD  loratadine (CLARITIN) 10 MG tablet Take 10 mg by mouth. 01/09/17   [provider]  metoCLOPramide (REGLAN) 10 MG tablet Take 1 tablet (10 mg total) by mouth every 6 (six) hours as needed for nausea (nausea/headache). 12/23/16   Daleen Bo, MD  montelukast (SINGULAIR) 10 MG tablet TAKE 1 TABLET BY MOUTH EVERY NIGHT AT BEDTIME 03/12/17   Raylene Everts, MD  montelukast (SINGULAIR) 10 MG tablet Take 10 mg by mouth. 01/09/17   [provider]  ondansetron (ZOFRAN ODT) 8 MG disintegrating tablet 37m ODT q4 hours prn nausea 05/04/17   DVeryl Speak MD  Oxcarbazepine (TRILEPTAL) 300 MG tablet Take one tablet by mouth twice daily/as directed per prescriber 01/06/17   [provider]  paliperidone  (INVEGA) 3 MG 24 hr tablet Take 3 mg by mouth daily.    [provider]  predniSONE (DELTASONE) 20 MG tablet Take 3 po QD x 3d , then 2 po QD x 3d then 1 po QD x 3d 06/13/17   KRolland Porter MD  Respiratory Therapy Supplies (NEBULIZER COMPRESSOR) KIT And supplies 04/29/17   NRaylene Everts MD  sertraline (ZOLOFT) 100 MG tablet Take 1 tablet (100 mg total) by mouth daily. 11/13/14   DNiel Hummer NP  sertraline (ZOLOFT) 100 MG tablet Take 100 mg by mouth. 11/13/14   [provider]    Family History Family History  Problem Relation Age of Onset  . Asthma Mother   . Ulcers Mother   . Bipolar disorder Mother   . ADD / ADHD Father   . Diabetes Maternal Grandmother   . Hypertension Maternal Grandmother   . Sleep apnea Maternal Grandmother   . Heart disease Maternal Grandmother   . Diabetes Maternal Grandfather   . Hypertension Maternal Grandfather   . Sleep apnea Maternal Grandfather   . Heart disease Maternal Grandfather   . Cancer Maternal Grandfather        prostate  . Cancer Maternal Aunt        Leukemia  . Colon cancer Neg Hx   . Liver disease Neg Hx     Social History Social History   Tobacco Use  . Smoking status: Former Smoker    Packs/day: 0.00    Years: 2.00    Pack years: 0.00    Last attempt to quit: 10/06/2012    Years since quitting: 4.7  . Smokeless tobacco: Never Used  . Tobacco comment: Never smoked cigarettes  Substance Use Topics  . Alcohol use: No    Alcohol/week: 0.0 oz    Comment: denies usage  . Drug use: No     Allergies   Nsaids; Pineapple; Remicade [infliximab]; Strawberry extract; Amoxicillin-pot clavulanate; Omeprazole; and Tomato   Review of Systems Review of Systems  Constitutional: Positive for fatigue. Negative for chills and fever.  HENT: Positive for congestion and rhinorrhea. Negative for ear pain, sinus pressure, sore throat, trouble swallowing and voice change.   Eyes: Negative for discharge.  Respiratory:  Positive for cough. Negative for shortness of breath, wheezing and stridor.   Cardiovascular: Negative.  Negative for chest pain.  Gastrointestinal: Negative.  Negative for abdominal pain.  Genitourinary: Negative.   Musculoskeletal: Positive for myalgias.  Skin: Negative.      Physical Exam Updated Vital Signs BP 118/82 (BP Location: Left Arm)   Pulse 85   Temp 98.6 F (37 C) (Oral)   Resp 18  Ht 5' 7"  (1.702 m)   Wt 113.4 kg (250 lb)   SpO2 97%   BMI 39.16 kg/m   Physical Exam  Constitutional: He is oriented to person, place, and time. He appears well-developed and well-nourished.  HENT:  Head: Normocephalic and atraumatic.  Right Ear: Tympanic membrane and ear canal normal.  Left Ear: Tympanic membrane and ear canal normal.  Nose: Rhinorrhea present. No mucosal edema.  Mouth/Throat: Uvula is midline, oropharynx is clear and moist and mucous membranes are normal. No oropharyngeal exudate, posterior oropharyngeal edema, posterior oropharyngeal erythema or tonsillar abscesses.  Eyes: Conjunctivae are normal.  Cardiovascular: Normal rate and normal heart sounds.  Pulmonary/Chest: Effort normal. No respiratory distress. He has no wheezes. He has no rhonchi. He has no rales.  Abdominal: Soft. There is no tenderness.  Musculoskeletal: Normal range of motion.  Neurological: He is alert and oriented to person, place, and time.  Skin: Skin is warm and dry. No rash noted.  Psychiatric: He has a normal mood and affect.  Nursing note and vitals reviewed.    ED Treatments / Results  Labs (all labs ordered are listed, but only abnormal results are displayed) Labs Reviewed  INFLUENZA PANEL BY PCR (TYPE A & B)    EKG  EKG Interpretation None       Radiology No results found.  Procedures Procedures (including critical care time)  Medications Ordered in ED Medications - No data to display   Initial Impression / Assessment and Plan / ED Course  I have reviewed the  triage vital signs and the nursing notes.  Pertinent labs & imaging results that were available during my care of the patient were reviewed by me and considered in my medical decision making (see chart for details).     Patient with no acute findings on exam.  Lungs are clear to auscultation, no wheezing no respiratory distress with normal vital signs.  I suspect he may have a mild viral illness although his influenza test tonight is negative.  Discussed home treatments including rest, increase fluids, Tylenol for fever or body aches.  Plan recheck for any worsening or persistent symptoms.  Final Clinical Impressions(s) / ED Diagnoses   Final diagnoses:  Influenza-like illness    ED Discharge Orders        Ordered    benzonatate (TESSALON) 100 MG capsule  3 times daily PRN     07/20/17 2026       Evalee Jefferson, PA-C 07/20/17 2027    Julianne Rice, MD 07/20/17 2256

## 2017-07-20 NOTE — ED Triage Notes (Signed)
Patient stated cough, weakness, nasal drainage with body aches that began yesterday.

## 2017-07-20 NOTE — Discharge Instructions (Signed)
As discussed, your exam and symptoms suggest that you have the flu, however your flu test is negative today.  I suspect you do have a viral infection which should resolve on its own.  In the interim, rest to make sure you are drinking plenty of fluids.  Take Tylenol if needed for fever and body aches.  I have prescribed you a medicine to help you with your cough if needed.  Plan to see your doctor for any worsening or persistent symptoms.

## 2017-07-22 NOTE — Progress Notes (Signed)
REVIEWED-NO ADDITIONAL RECOMMENDATIONS. 

## 2017-07-29 ENCOUNTER — Encounter: Payer: Self-pay | Admitting: Family Medicine

## 2017-07-29 ENCOUNTER — Ambulatory Visit (INDEPENDENT_AMBULATORY_CARE_PROVIDER_SITE_OTHER): Payer: Medicaid Other | Admitting: Family Medicine

## 2017-07-29 ENCOUNTER — Other Ambulatory Visit: Payer: Self-pay

## 2017-07-29 VITALS — BP 110/70 | HR 84 | Temp 99.0°F | Resp 20 | Ht 68.0 in | Wt 263.1 lb

## 2017-07-29 DIAGNOSIS — K51011 Ulcerative (chronic) pancolitis with rectal bleeding: Secondary | ICD-10-CM

## 2017-07-29 DIAGNOSIS — H9201 Otalgia, right ear: Secondary | ICD-10-CM

## 2017-07-29 DIAGNOSIS — R197 Diarrhea, unspecified: Secondary | ICD-10-CM

## 2017-07-29 MED ORDER — ONDANSETRON 8 MG PO TBDP: ORAL_TABLET | ORAL | 0 refills | Status: DC

## 2017-07-29 MED ORDER — NEOMYCIN-POLYMYXIN-HC 3.5-10000-1 OT SOLN: 3.0000 [drp] | Freq: Four times a day (QID) | OTIC | 0 refills | Status: DC

## 2017-07-29 MED ORDER — DIPHENOXYLATE-ATROPINE 2.5-0.025 MG PO TABS: 1.0000 | ORAL_TABLET | Freq: Four times a day (QID) | ORAL | 0 refills | Status: DC | PRN

## 2017-07-29 NOTE — Progress Notes (Signed)
Chief Complaint  Patient presents with  . Otalgia    right   Patient is here for 2 problems. First, patient complains of ear pain on the right for 3 days. No drainage from the ear no change in hearing.  No cough cold runny nose or sore throat.  No fever or chills. His second complaint is diarrhea.  He has had diarrhea for 3 days.  Numerous times a day watery diarrhea.  Patient is lost count.  No blood or mucus in the bowels.  No foul odor to the bowels.  No fever or chills.  No abdominal pain.  Just uncontrolled diarrhea.  He has had some nausea with no vomiting.  He is eating normally.  He has not modified his diet for the diarrhea.  Mother says he is out of his diarrhea medicine and his nausea medicine.  Patient Active Problem List   Diagnosis Date Noted  . ADHD 01/09/2017  . Environmental allergies 01/09/2017  . Class 1 obesity due to excess calories with body mass index (BMI) of 34.0 to 34.9 in adult 01/09/2017  . Autoimmune hepatitis ([place]) 01/02/2017  . Asthma in adult, mild intermittent, uncomplicated 81/19/1478  . Anemia 12/31/2016  . Hyperammonemia ([place]) 12/31/2016  . Elevated LFTs 12/31/2016  . Abnormal thyroid function test 03/24/2016  . Ulcerative colitis ([place]) 12/29/2015  . Bipolar 1 disorder, manic, moderate ([place]) 11/09/2014  . Mental developmental delay 11/15/2012    Outpatient Encounter Medications as of 07/29/2017  Medication Sig  . albuterol (PROVENTIL HFA) 108 (90 Base) MCG/ACT inhaler Inhale into the lungs.  Marland Kitchen albuterol (PROVENTIL) (2.5 MG/3ML) 0.083% nebulizer solution Inhale 3 mLs (2.5 mg total) into the lungs every 6 (six) hours as needed for wheezing or shortness of breath.  . cholecalciferol (VITAMIN D) 1000 units tablet Take 1 tablet (1,000 Units total) by mouth daily. START AFTER VITAMIN D 50,000 U TABLETS ARE COMPLETE  . cloNIDine HCl (KAPVAY) 0.1 MG TB12 ER tablet Take 0.1 mg by mouth 2 (two) times daily.  . diphenoxylate-atropine (LOMOTIL) 2.5-0.025  MG tablet Take 1 tablet by mouth 4 (four) times daily as needed for diarrhea or loose stools.  Marland Kitchen EPINEPHrine (EPIPEN 2-PAK) 0.3 mg/0.3 mL IJ SOAJ injection Inject 0.3 mg into the muscle once as needed (for severe allergic reaction).  . famotidine (PEPCID) 20 MG tablet Take 1 tablet (20 mg total) by mouth 2 (two) times daily. (Patient taking differently: Take 20 mg by mouth daily. )  . lactulose (CHRONULAC) 10 GM/15ML solution Take by mouth.  . lidocaine (XYLOCAINE) 2 % solution 10 mL by Mouth route every three (3) hours as needed.  . loperamide (IMODIUM) 2 MG capsule Take 1 capsule (2 mg total) by mouth 4 (four) times daily as needed for diarrhea or loose stools.  Marland Kitchen loratadine (CLARITIN) 10 MG tablet Take 10 mg by mouth.  . metoCLOPramide (REGLAN) 10 MG tablet Take 1 tablet (10 mg total) by mouth every 6 (six) hours as needed for nausea (nausea/headache).  . montelukast (SINGULAIR) 10 MG tablet Take 10 mg by mouth.  . ondansetron (ZOFRAN ODT) 8 MG disintegrating tablet 75m ODT q4 hours prn nausea  . Oxcarbazepine (TRILEPTAL) 300 MG tablet Take one tablet by mouth twice daily/as directed per prescriber  . paliperidone (INVEGA) 3 MG 24 hr tablet Take 3 mg by mouth daily.  .Marland KitchenRespiratory Therapy Supplies (NEBULIZER COMPRESSOR) KIT And supplies  . sertraline (ZOLOFT) 50 MG tablet Take 75 mg by mouth daily.  .Marland Kitchenneomycin-polymyxin-hydrocortisone (CORTISPORIN)  OTIC solution Place 3 drops into the right ear 4 (four) times daily.   No facility-administered encounter medications on file as of 07/29/2017.     Allergies  Allergen Reactions  . Nsaids Other (See Comments)    Should not take due to GI condition  . Pineapple Swelling and Other (See Comments)    Reaction:  Lip swelling  . Remicade [Infliximab] Other (See Comments)    AUTOIMMUNE HEPATITIS  . Strawberry Extract Swelling    Reaction:  Lip swelling  . Amoxicillin-Pot Clavulanate Nausea And Vomiting and Other (See Comments)    GI intolerance     . Omeprazole Nausea And Vomiting  . Tomato Rash    Review of Systems  Constitutional: Positive for unexpected weight change. Negative for activity change, appetite change, chills, fatigue and fever.       Weight gain 13 pounds  HENT: Positive for ear pain. Negative for congestion, dental problem, ear discharge, rhinorrhea, sinus pain and sore throat.   Eyes: Negative for photophobia and visual disturbance.  Respiratory: Negative for cough and shortness of breath.   Cardiovascular: Negative for chest pain and palpitations.  Gastrointestinal: Positive for diarrhea and nausea. Negative for abdominal distention, abdominal pain, blood in stool and vomiting.  Genitourinary: Negative for difficulty urinating and frequency.  Neurological: Negative for dizziness and facial asymmetry.  Psychiatric/Behavioral: Negative for behavioral problems and dysphoric mood.  All other systems reviewed and are negative.   BP 110/70 (BP Location: Right Arm, Patient Position: Sitting, Cuff Size: Large)   Pulse 84   Temp 99 F (37.2 C) (Oral)   Resp 20   Ht 5' 8"  (1.727 m)   Wt 263 lb 1.3 oz (119.3 kg)   SpO2 99%   BMI 40.00 kg/m   Physical Exam  Constitutional: He appears well-developed and well-nourished.  Obese.  Noncommunicative.  Sleeps through most of interview.  Has to be awakened to examine.  Looks to his mother for answers.  HENT:  Head: Normocephalic and atraumatic.  Left Ear: External ear normal.  Mouth/Throat: Oropharynx is clear and moist.  Small skin infection in right external ear.  No pain with traction of the pinna.  Looks like a large pimple.  TMs are clear bilaterally.  Eyes: Conjunctivae are normal. Pupils are equal, round, and reactive to light.  Neck: Normal range of motion.  Cardiovascular: Normal rate, regular rhythm and normal heart sounds.  Pulmonary/Chest: Effort normal and breath sounds normal.  Abdominal: Soft.  Active bowel sounds.  No tenderness.  No guarding no  rebound.  Lymphadenopathy:    He has no cervical adenopathy.  Psychiatric:  Somnolent.  Slow to respond.    ASSESSMENT/PLAN:  1. Ulcerative pancolitis with rectal bleeding ([place]) By history.  Also history of ulcerative colitis.  Under the care of local gastroenterology and [place] gastro[facility].  Poor fund of knowledge.  Poor social situation.  Patient and family do not know how to manage his symptoms.  Do not know how to handle simple diarrhea.  I advised him to call the [place] doctors for additional instructions.  2. Ear pain, right Slight infection at her ear canal.  We will give her eardrops  3. Diarrhea in adult patient Discussed pushing fluids.  Brat diet.  Lomotil.  Zofran for nausea.  Call [place] doctors today for additional advice.   Patient Instructions  Use ear drops Tylenol for pain  I have refilled your diarrhea and nausea medicines  CALL [facility] with the diarrhea.  They  Need to  know about any change in symptoms.   Raylene Everts, MD

## 2017-07-29 NOTE — Patient Instructions (Signed)
Use ear drops Tylenol for pain  I have refilled your diarrhea and nausea medicines  CALL [facility] with the diarrhea.  They  Need to know about any change in symptoms.

## 2017-07-30 ENCOUNTER — Ambulatory Visit: Payer: Self-pay | Admitting: Family Medicine

## 2017-08-04 IMAGING — US US BIOPSY
1 series · 13 of 20 positions shown · non-contrast
Comparison: none

INDICATION: Abnormal LFTs

[Series 1: us biopsy · 0.26mm/px · 13 of 20 slices shown]
[im 1/20]
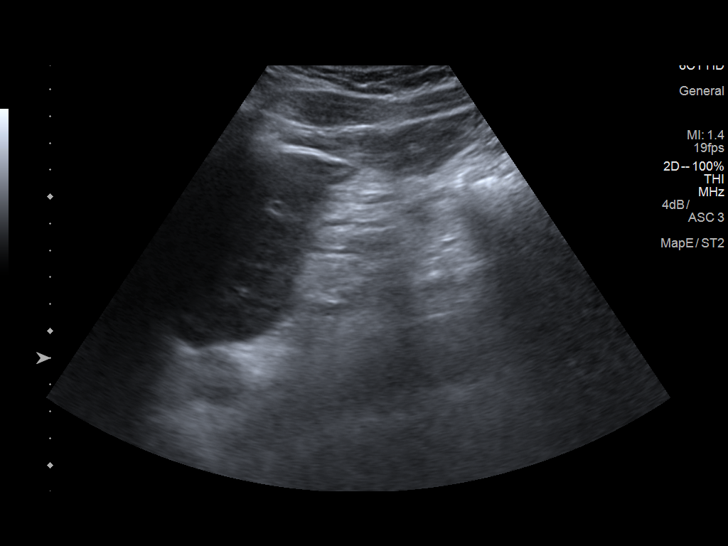
[im 3/20]
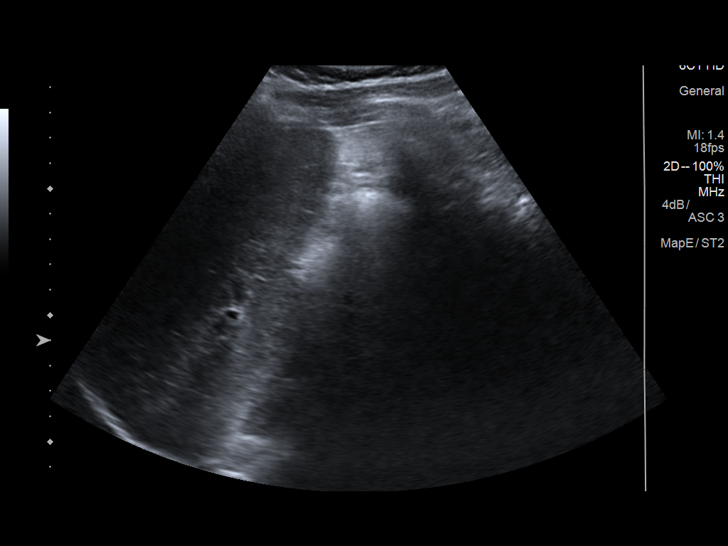
[im 4/20]
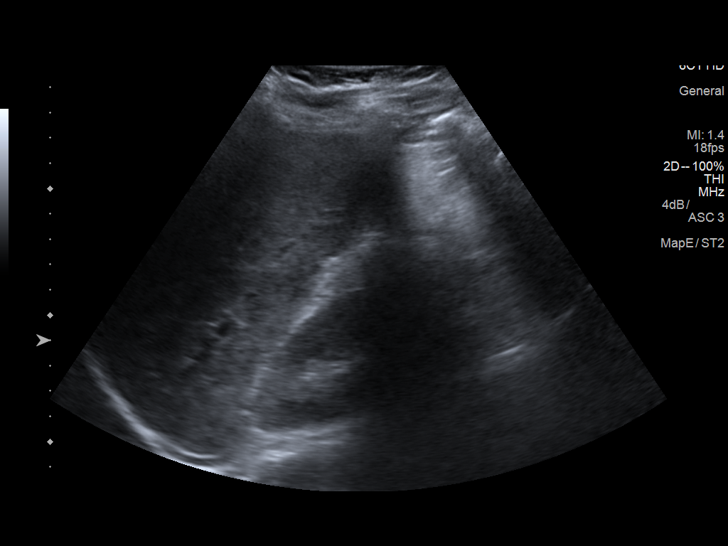
[im 6/20]
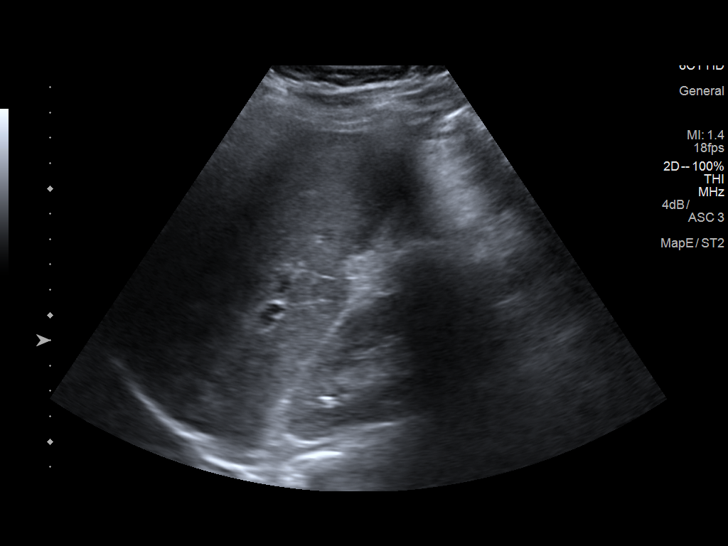
[im 7/20]
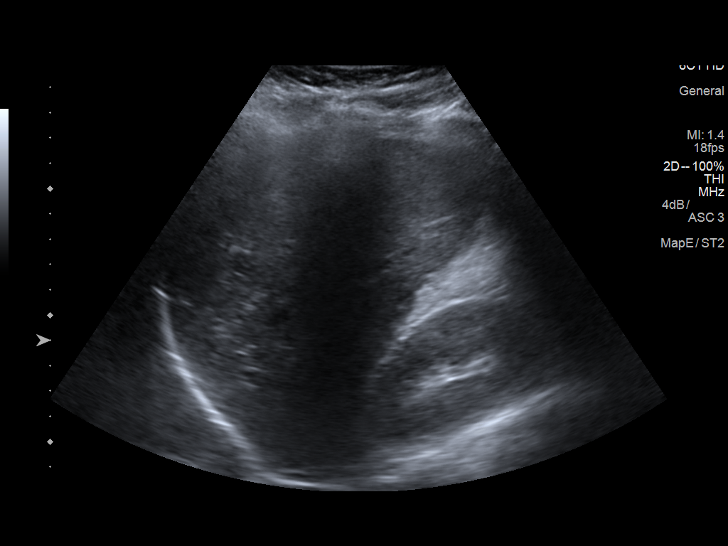
[im 9/20]
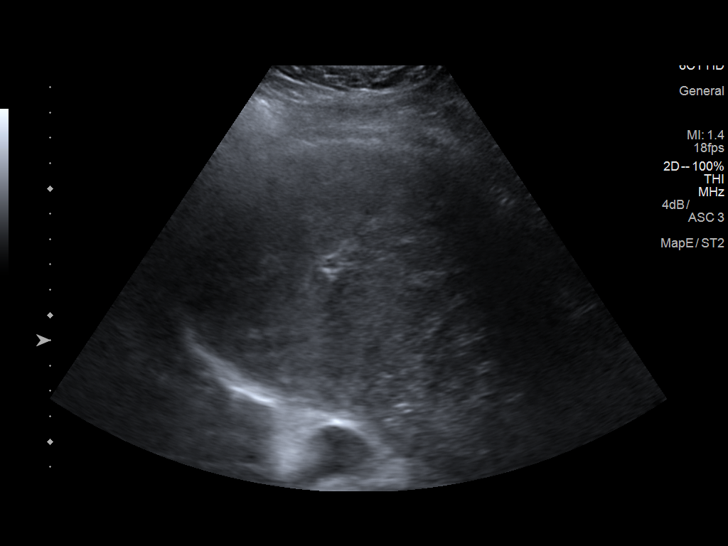
[im 11/20]
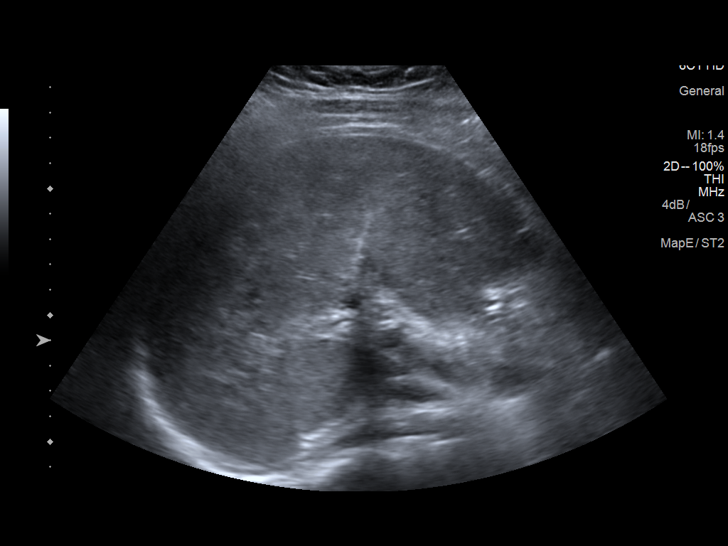
[im 12/20]
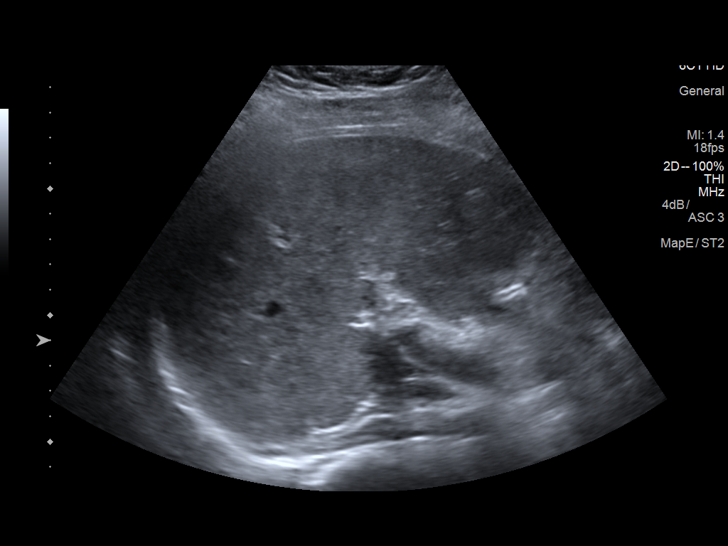
[im 14/20]
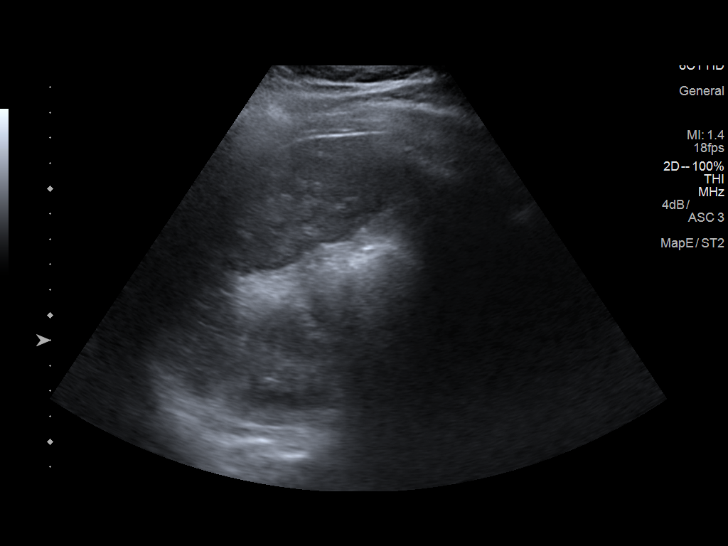
[im 15/20]
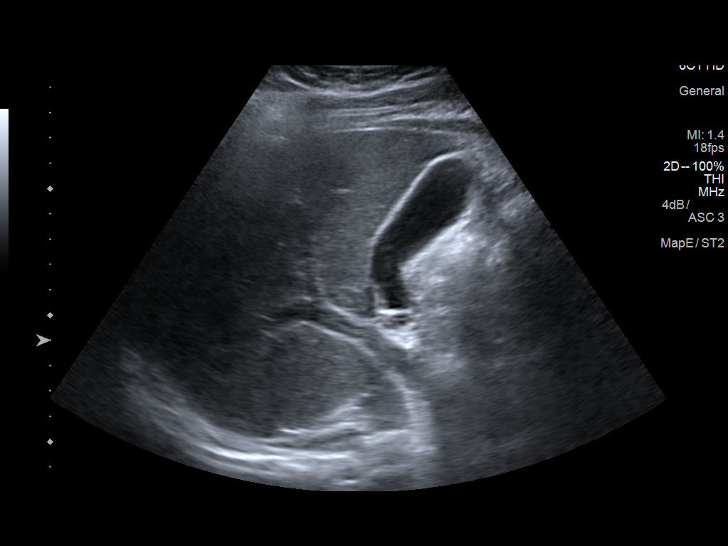
[im 17/20]
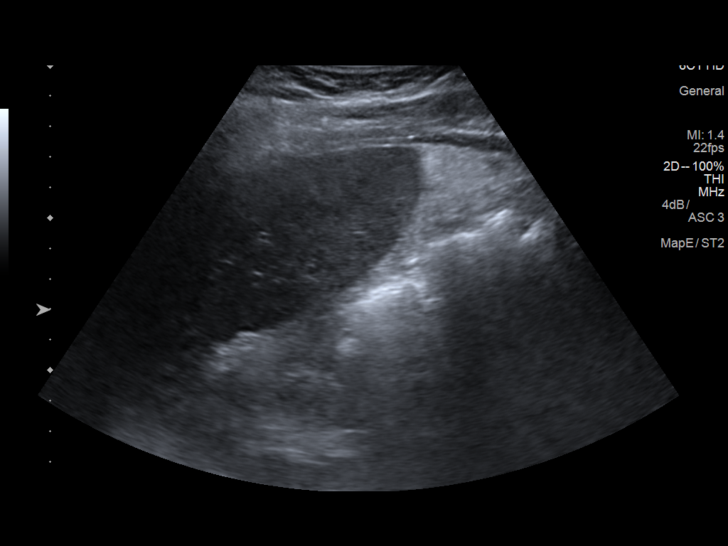
[im 18/20]
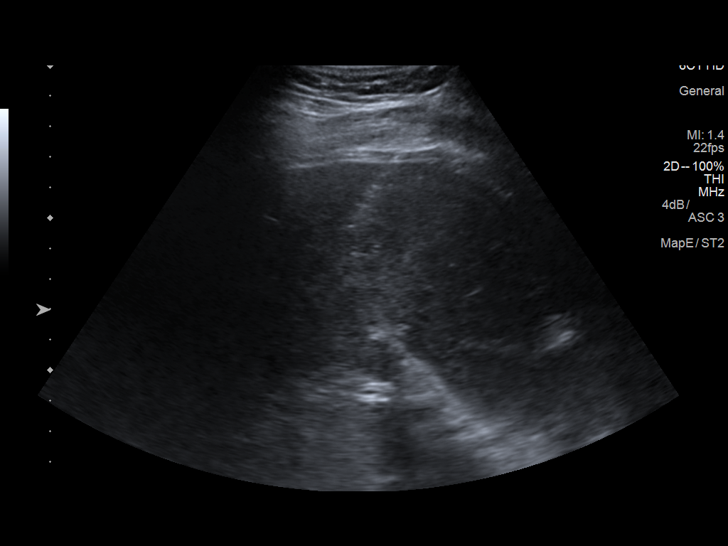
[im 20/20]
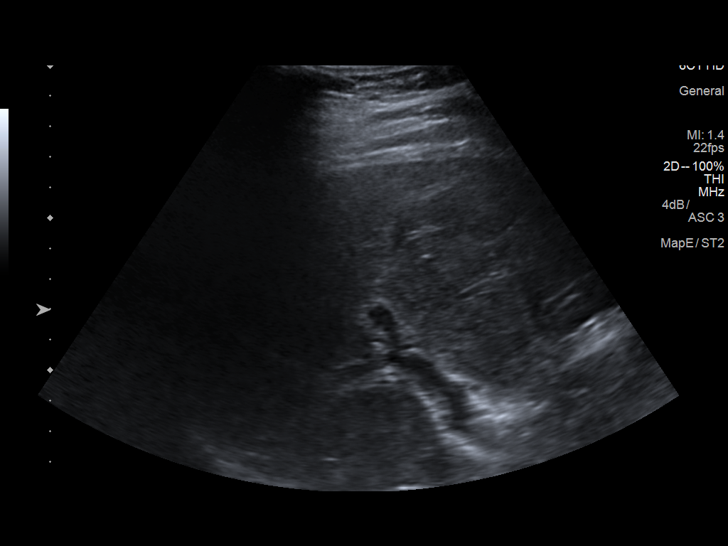

[13 of 20 positions shown; findings below may reference images not displayed]

EXAM:
ULTRASOUND-GUIDED RANDOM LIVER CORE BIOPSY.

MEDICATIONS:
None.

ANESTHESIA/SEDATION:
Fentanyl 50 mcg IV; Versed 1 mg IV

Moderate Sedation Time:  10

The patient was continuously monitored during the procedure by the
interventional radiology nurse under my direct supervision.

FLUOROSCOPY TIME:  None

COMPLICATIONS:
None immediate.

PROCEDURE:
Informed written consent was obtained from the patient after a
thorough discussion of the procedural risks, benefits and
alternatives. All questions were addressed. Maximal Sterile Barrier
Technique was utilized including caps, mask, sterile gowns, sterile
gloves, sterile drape, hand hygiene and skin antiseptic. A timeout
was performed prior to the initiation of the procedure.

The right flank was prepped with ChloraPrep in a sterile fashion,
and a sterile drape was applied covering the operative field. A
sterile gown and sterile gloves were used for the procedure.

Under sonographic guidance, an 17 gauge guide needle was advanced
into the right lobe of the liver. Subsequently 4 18 gauge core
biopsies were obtained. The guide needle was removed. Final imaging
was performed.

Patient tolerated the procedure well without complication. Vital
sign monitoring by nursing staff during the procedure will continue
as patient is in the special procedures unit for post procedure
observation.
FINDINGS: The images document guide needle placement within the right lobe of
the liver. Post biopsy images demonstrate no hemorrhage.
IMPRESSION: Successful ultrasound-guided core biopsy within the right lobe of
the liver.

## 2017-08-12 ENCOUNTER — Other Ambulatory Visit: Payer: Self-pay

## 2017-08-12 ENCOUNTER — Encounter (HOSPITAL_COMMUNITY): Payer: Self-pay | Admitting: Emergency Medicine

## 2017-08-12 ENCOUNTER — Emergency Department (HOSPITAL_COMMUNITY)
Admission: EM | Admit: 2017-08-12 | Discharge: 2017-08-12 | Disposition: A | Discharge status: Home / Self Care | Payer: Medicaid Other | Attending: Emergency Medicine | Admitting: Emergency Medicine

## 2017-08-12 ENCOUNTER — Emergency Department (HOSPITAL_COMMUNITY): Payer: Medicaid Other

## 2017-08-12 DIAGNOSIS — F419 Anxiety disorder, unspecified: Secondary | ICD-10-CM | POA: Diagnosis not present

## 2017-08-12 DIAGNOSIS — F909 Attention-deficit hyperactivity disorder, unspecified type: Secondary | ICD-10-CM | POA: Diagnosis not present

## 2017-08-12 DIAGNOSIS — R109 Unspecified abdominal pain: Secondary | ICD-10-CM

## 2017-08-12 DIAGNOSIS — F319 Bipolar disorder, unspecified: Secondary | ICD-10-CM | POA: Diagnosis not present

## 2017-08-12 DIAGNOSIS — J45909 Unspecified asthma, uncomplicated: Secondary | ICD-10-CM | POA: Diagnosis not present

## 2017-08-12 DIAGNOSIS — Z87891 Personal history of nicotine dependence: Secondary | ICD-10-CM | POA: Diagnosis not present

## 2017-08-12 DIAGNOSIS — Z79899 Other long term (current) drug therapy: Secondary | ICD-10-CM | POA: Diagnosis not present

## 2017-08-12 LAB — CBC WITH DIFFERENTIAL/PLATELET
Basophils Absolute: 0 10*3/uL (ref 0.0–0.1)
Basophils Relative: 0 %
Eosinophils Absolute: 0.2 10*3/uL (ref 0.0–0.7)
Eosinophils Relative: 3 %
HCT: 38.1 % — ABNORMAL LOW (ref 39.0–52.0)
Hemoglobin: 12.8 g/dL — ABNORMAL LOW (ref 13.0–17.0)
Lymphocytes Relative: 37 %
Lymphs Abs: 2.3 10*3/uL (ref 0.7–4.0)
MCH: 26.5 pg (ref 26.0–34.0)
MCHC: 33.6 g/dL (ref 30.0–36.0)
MCV: 78.9 fL (ref 78.0–100.0)
Monocytes Absolute: 0.9 10*3/uL (ref 0.1–1.0)
Monocytes Relative: 14 %
Neutro Abs: 2.8 10*3/uL (ref 1.7–7.7)
Neutrophils Relative %: 46 %
Platelets: 240 10*3/uL (ref 150–400)
RBC: 4.83 MIL/uL (ref 4.22–5.81)
RDW: 15.1 % (ref 11.5–15.5)
WBC: 6.3 10*3/uL (ref 4.0–10.5)

## 2017-08-12 LAB — COMPREHENSIVE METABOLIC PANEL
ALT: 21 U/L (ref 17–63)
AST: 27 U/L (ref 15–41)
Albumin: 3.9 g/dL (ref 3.5–5.0)
Alkaline Phosphatase: 71 U/L (ref 38–126)
Anion gap: 10 (ref 5–15)
BUN: 11 mg/dL (ref 6–20)
CO2: 20 mmol/L — ABNORMAL LOW (ref 22–32)
Calcium: 9.1 mg/dL (ref 8.9–10.3)
Chloride: 106 mmol/L (ref 101–111)
Creatinine, Ser: 1.03 mg/dL (ref 0.61–1.24)
GFR calc Af Amer: >60 mL/min (ref >60)
GFR calc non Af Amer: >60 mL/min (ref >60)
Glucose, Bld: 94 mg/dL (ref 65–99)
Potassium: 3.7 mmol/L (ref 3.5–5.1)
Sodium: 136 mmol/L (ref 135–145)
Total Bilirubin: 0.2 mg/dL — ABNORMAL LOW (ref 0.3–1.2)
Total Protein: 7.1 g/dL (ref 6.5–8.1)

## 2017-08-12 LAB — URINALYSIS, ROUTINE W REFLEX MICROSCOPIC
Bilirubin Urine: NEGATIVE
Glucose, UA: NEGATIVE mg/dL
Hgb urine dipstick: NEGATIVE
Ketones, ur: NEGATIVE mg/dL
Leukocytes, UA: NEGATIVE
Nitrite: NEGATIVE
Protein, ur: NEGATIVE mg/dL
Specific Gravity, Urine: 1.027 (ref 1.005–1.030)
pH: 5 (ref 5.0–8.0)

## 2017-08-12 LAB — LIPASE, BLOOD: Lipase: 32 U/L (ref 11–51)

## 2017-08-12 LAB — AMMONIA: Ammonia: 42 umol/L — ABNORMAL HIGH (ref 9–35)

## 2017-08-12 MED ORDER — IOPAMIDOL (ISOVUE-300) INJECTION 61%
100.0000 mL | Freq: Once | INTRAVENOUS | Status: AC | PRN
  Administered 2017-08-12: 100 mL via INTRAVENOUS

## 2017-08-12 MED ORDER — DICYCLOMINE HCL 20 MG PO TABS: 20.0000 mg | ORAL_TABLET | Freq: Two times a day (BID) | ORAL | 0 refills | Status: DC

## 2017-08-12 NOTE — ED Notes (Signed)
Pt given water for fluid challenge; pt has not had ANY diarrhea since arriving to ED.

## 2017-08-12 NOTE — ED Triage Notes (Signed)
Pt c/o right sided abd pain with diarrhea x 4 days. Pt was taking lomotil with no effect.

## 2017-08-12 NOTE — ED Notes (Signed)
Pt returned from CT °

## 2017-08-12 NOTE — Discharge Instructions (Signed)
There is no evidence of an ulcerative colitis exacerbation today.  Take the medication for abdominal cramping as prescribed.  Follow-up with Dr. [name].  Return to the ED if you develop new or worsening symptoms.

## 2017-08-12 NOTE — ED Provider Notes (Signed)
[facility] EMERGENCY DEPARTMENT Provider Note   CSN: [id number] Arrival date & time: 08/12/17  0252     History   Chief Complaint Chief Complaint  Patient presents with  . Abdominal Pain    HPI Wayne Avila is a 27 y.o. male.  Patient with history of ulcerative colitis presenting with right-sided abdominal pain that onset yesterday evening.  States this pain is more sharp than his usual pain from ulcerative colitis.  He follows with Dr. [name] locally and [facility] gastroenterology.  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  He denies any fever, chills, nausea or vomiting.  No pain with urination or blood in the urine.  No previous abdominal surgeries.  Reports worsening diarrhea over the past 4 days.  Has 4 episodes of loose nonbloody stools yesterday and the day before that.  His mother states his been having issues with diarrhea for the past 2 weeks and was prescribed Lomotil by his PCP but is now out.  No previous abdominal surgeries.   The history is provided by the patient and a relative.  Abdominal Pain   Associated symptoms include diarrhea. Pertinent negatives include fever, nausea, vomiting, dysuria, hematuria, headaches, arthralgias and myalgias.    Past Medical History:  Diagnosis Date  . ADHD (attention deficit hyperactivity disorder)   . Anxiety   . Asthma   . Autoimmune hepatitis ([place])   . Bipolar 1 disorder ([place])   . C. difficile colitis 03/22/2016  . Clostridium difficile colitis   . Depression   . GERD (gastroesophageal reflux disease)   . HOH (hard of hearing)   . Mental developmental delay   . Seizures ([place])   . Ulcerative colitis [facility])     Patient Active Problem List   Diagnosis Date Noted  . ADHD 01/09/2017  . Environmental allergies 01/09/2017  . Class 1 obesity due to excess calories with body mass index (BMI) of 34.0 to 34.9 in adult 01/09/2017  . Autoimmune hepatitis ([place]) 01/02/2017  . Asthma in  adult, mild intermittent, uncomplicated 61/44/3154  . Anemia 12/31/2016  . Hyperammonemia ([place]) 12/31/2016  . Elevated LFTs 12/31/2016  . Abnormal thyroid function test 03/24/2016  . Ulcerative colitis ([place]) 12/29/2015  . Bipolar 1 disorder, manic, moderate ([place]) 11/09/2014  . Mental developmental delay 11/15/2012    Past Surgical History:  Procedure Laterality Date  . ADENOIDECTOMY    . BIOPSY N/A 12/07/2012   Procedure: GASTRIC BIOPSIES;  Surgeon: [name], MD;  Location: [facility] ORS;  Service: Endoscopy;  Laterality: N/A;  . BIOPSY N/A 08/30/2013   Procedure: BIOPSY;  Surgeon: [name], MD;  Location: [facility] ORS;  Service: Endoscopy;  Laterality: N/A;  right colon,transverse colon, left colon, rectal biopsies  . BIOPSY  11/20/2015   Procedure: BIOPSY;  Surgeon: [name], MD;  Location: [facility] ENDO SUITE;  Service: Endoscopy;;  ileal, right colon biopsy, left colon, rectum  . BIOPSY  04/18/2016   Procedure: BIOPSY;  Surgeon: [name], MD;  Location: [facility] END[facility] SUITE;  Service: Endoscopy;;  left descending colon biopsies  . COLONOSCOPY  11/20/2015   Dr. [name]: Severe erythema, edema, ulcers from the anal verge to 20 cm above the anal verge without mucosal sparing, remaining colon and terminal ileum appeared normal. Biopsies from the rectum revealed fulminant active chronic colitis consistent with IBD. Pathology from terminal ileum revealed intramucosal lymphoid aggregates. Remaining colon random biopsies with inactive chronic colitis  . COLONOSCOPY WITH PROPOFOL N/A  08/30/2013   HJS:CBIPJR mucosa in the terminal ileum/COLITIS/ MILD PROCTITIS. 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  Marland Kitchen COLONOSCOPY WITH PROPOFOL N/A 11/20/2015   Dr. 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 ESOPHAGOGASTRODUODENOSCOPY  11/20/2015   Dr. [name]: Normal exam,  stomach biopsied and duodenal biopsy.. Benign biopsies.  . ESOPHAGOGASTRODUODENOSCOPY (EGD) WITH PROPOFOL N/A 12/07/2012   SLF:The mucosa of the esophagus appeared normal Non-erosive gastritis (inflammation) was found in the gastric antrum; multiple biopsies The duodenal mucosa showed no abnormalities in the bulb and second portion of the duodenum  . ESOPHAGOGASTRODUODENOSCOPY (EGD) WITH PROPOFOL N/A 11/20/2015   Dr. [name]: normal with normal biopsies, negative H.pylori   . ESOPHAGOGASTRODUODENOSCOPY (EGD) WITH PROPOFOL N/A 04/18/2016   Procedure: ESOPHAGOGASTRODUODENOSCOPY (EGD) WITH PROPOFOL;  Surgeon: [name], MD;  Location: [facility];  Service: Endoscopy;  Laterality: N/A;  . FLEXIBLE SIGMOIDOSCOPY N/A 04/18/2016   Procedure: FLEXIBLE SIGMOIDOSCOPY;  Surgeon: [name], MD;  Location: [facility];  Service: Endoscopy;  Laterality: N/A;  . TONSILLECTOMY         Home Medications    Prior to Admission medications   Medication Sig Start Date End Date Taking? Authorizing Provider  albuterol (PROVENTIL HFA) 108 (90 Base) MCG/ACT inhaler Inhale into the lungs.    [provider]  albuterol (PROVENTIL) (2.5 MG/3ML) 0.083% nebulizer solution Inhale 3 mLs (2.5 mg total) into the lungs every 6 (six) hours as needed for wheezing or shortness of breath. 04/29/17   Raylene Everts, MD  cholecalciferol (VITAMIN D) 1000 units tablet Take 1 tablet (1,000 Units total) by mouth daily. START AFTER VITAMIN D 50,000 U TABLETS ARE COMPLETE 04/08/16   Fields, Marga Melnick, MD  cloNIDine HCl (KAPVAY) 0.1 MG TB12 ER tablet Take 0.1 mg by mouth 2 (two) times daily.    [provider]  diphenoxylate-atropine (LOMOTIL) 2.5-0.025 MG tablet Take 1 tablet by mouth 4 (four) times daily as needed for diarrhea or loose stools. 07/29/17   Raylene Everts, MD  EPINEPHrine (EPIPEN 2-PAK) 0.3 mg/0.3 mL IJ SOAJ injection Inject 0.3 mg into the muscle once as needed (for severe allergic  reaction).    [provider]  famotidine (PEPCID) 20 MG tablet Take 1 tablet (20 mg total) by mouth 2 (two) times daily. Patient taking differently: Take 20 mg by mouth daily.  02/21/17   Milton Ferguson, MD  lactulose [facility]) 10 GM/15ML solution Take by mouth. 01/02/17   [provider]  lidocaine (XYLOCAINE) 2 % solution 10 mL by Mouth route every three (3) hours as needed.    [provider]  loperamide (IMODIUM) 2 MG capsule Take 1 capsule (2 mg total) by mouth 4 (four) times daily as needed for diarrhea or loose stools. 03/19/17   Horton, Barbette Hair, MD  loratadine (CLARITIN) 10 MG tablet Take 10 mg by mouth. 01/09/17   [provider]  metoCLOPramide (REGLAN) 10 MG tablet Take 1 tablet (10 mg total) by mouth every 6 (six) hours as needed for nausea (nausea/headache). 12/23/16   Daleen Bo, MD  montelukast (SINGULAIR) 10 MG tablet Take 10 mg by mouth. 01/09/17   [provider]  neomycin-polymyxin-hydrocortisone (CORTISPORIN) OTIC solution Place 3 drops into the right ear 4 (four) times daily. 07/29/17   Raylene Everts, MD  ondansetron (ZOFRAN ODT) 8 MG disintegrating tablet 75m ODT q4 hours prn nausea 07/29/17  Raylene Everts, MD  Oxcarbazepine (TRILEPTAL) 300 MG tablet Take one tablet by mouth twice daily/as directed per prescriber 01/06/17   [provider]  paliperidone (INVEGA) 3 MG 24 hr tablet Take 3 mg by mouth daily.    [provider]  Respiratory Therapy Supplies (NEBULIZER COMPRESSOR) KIT And supplies 04/29/17   Raylene Everts, MD  sertraline (ZOLOFT) 50 MG tablet Take 75 mg by mouth daily.    [provider]    Family History Family History  Problem Relation Age of Onset  . Asthma Mother   . Ulcers Mother   . Bipolar disorder Mother   . ADD / ADHD Father   . Diabetes Maternal Grandmother   . Hypertension Maternal Grandmother   . Sleep apnea Maternal Grandmother   . Heart disease Maternal  Grandmother   . Diabetes Maternal Grandfather   . Hypertension Maternal Grandfather   . Sleep apnea Maternal Grandfather   . Heart disease Maternal Grandfather   . Cancer Maternal Grandfather        prostate  . Cancer Maternal Aunt        Leukemia  . Colon cancer Neg Hx   . Liver disease Neg Hx     Social History Social History   Tobacco Use  . Smoking status: Former Smoker    Packs/day: 0.00    Years: 2.00    Pack years: 0.00    Last attempt to quit: 10/06/2012    Years since quitting: 4.8  . Smokeless tobacco: Never Used  . Tobacco comment: Never smoked cigarettes  Substance Use Topics  . Alcohol use: No    Alcohol/week: 0.0 oz    Comment: denies usage  . Drug use: No     Allergies   Nsaids; Pineapple; Remicade [infliximab]; Strawberry extract; Amoxicillin-pot clavulanate; Omeprazole; and Tomato   Review of Systems Review of Systems  Constitutional: Positive for activity change and appetite change. Negative for chills and fever.  HENT: Negative for congestion and rhinorrhea.   Respiratory: Negative for cough, chest tightness and shortness of breath.   Gastrointestinal: Positive for abdominal pain and diarrhea. Negative for blood in stool, nausea and vomiting.  Genitourinary: Negative for dysuria, hematuria, testicular pain and urgency.  Musculoskeletal: Negative for arthralgias and myalgias.  Neurological: Negative for dizziness, weakness and headaches.    all other systems are negative except as noted in the HPI and PMH.    Physical Exam Updated Vital Signs BP 122/79   Pulse 99   Temp 98.3 F (36.8 C) (Oral)   Resp 17   Ht 5' 8" (1.727 m)   Wt 119.3 kg (263 lb)   SpO2 100%   BMI 39.99 kg/m   Physical Exam  Constitutional: He is oriented to person, place, and time. He appears well-developed and well-nourished. No distress.  HENT:  Head: Normocephalic and atraumatic.  Mouth/Throat: Oropharynx is clear and moist. No oropharyngeal exudate.  Eyes:  Conjunctivae and EOM are normal. Pupils are equal, round, and reactive to light.  Neck: Normal range of motion. Neck supple.  No meningismus.  Cardiovascular: Normal rate, regular rhythm, normal heart sounds and intact distal pulses.  No murmur heard. Pulmonary/Chest: Effort normal and breath sounds normal. No respiratory distress.  Abdominal: Soft. There is tenderness. There is no rebound and no guarding.  Mild R sided abdominal tenderness. Voluntary guarding.  Genitourinary:  Genitourinary Comments: No testicular tenderness  Musculoskeletal: Normal range of motion. He exhibits no edema or tenderness.  Neurological: He is alert  and oriented to person, place, and time. No cranial nerve deficit. He exhibits normal muscle tone. Coordination normal.  No ataxia on finger to nose bilaterally. No pronator drift. 5/5 strength throughout. CN 2-12 intact.Equal grip strength. Sensation intact.   Skin: Skin is warm.  Psychiatric: He has a normal mood and affect. His behavior is normal.  Nursing note and vitals reviewed.    ED Treatments / Results  Labs (all labs ordered are listed, but only abnormal results are displayed) Labs Reviewed  AMMONIA - Abnormal; Notable for the following components:      Result Value   Ammonia 42 (*)    All other components within normal limits  CBC WITH DIFFERENTIAL/PLATELET - Abnormal; Notable for the following components:   Hemoglobin 12.8 (*)    HCT 38.1 (*)    All other components within normal limits  COMPREHENSIVE METABOLIC PANEL - Abnormal; Notable for the following components:   CO2 20 (*)    Total Bilirubin 0.2 (*)    All other components within normal limits  URINALYSIS, ROUTINE W REFLEX MICROSCOPIC  LIPASE, BLOOD    EKG  EKG Interpretation None       Radiology Ct Abdomen Pelvis W Contrast  Result Date: 08/12/2017 CLINICAL DATA:  Initial evaluation for acute right-sided abdominal pain with diarrhea for 4 days. EXAM: CT ABDOMEN AND PELVIS  WITH CONTRAST TECHNIQUE: Multidetector CT imaging of the abdomen and pelvis was performed using the standard protocol following bolus administration of intravenous contrast. CONTRAST:  152m ISOVUE-300 IOPAMIDOL (ISOVUE-300) INJECTION 61% COMPARISON:  Prior CT from 05/04/2017. FINDINGS: Lower chest: Visualized lung bases are clear. Hepatobiliary: Lobulated contour of the liver again seen, relatively similar to previous. Otherwise, liver demonstrates fairly homogeneous and normal enhancement on today's exam. 11 mm hypodensity within the left hepatic lobe noted (series 2, image 16), indeterminate, but similar to previous. No other focal intrahepatic lesions. Gallbladder within normal limits. No biliary dilatation. Pancreas: Pancreas within normal limits. Spleen: Spleen within normal limits. Adrenals/Urinary Tract: Adrenal glands are normal. Kidneys equal in size with symmetric enhancement. No nephrolithiasis, hydronephrosis or focal enhancing renal mass. No hydroureter. Bladder within normal limits. Stomach/Bowel: Stomach unremarkable. No evidence for bowel obstruction. Appendix within normal limits. No acute inflammatory changes seen about the bowels. Vascular/Lymphatic: Normal intravascular enhancement seen throughout the intra-abdominal aorta. Mesenteric vessels patent proximally. No adenopathy. Reproductive: Prostate normal. Other: No free air or fluid.  No ascites. Musculoskeletal: No acute osseous abnormality. No worrisome lytic or blastic osseous lesions. IMPRESSION: 1. No CT evidence for acute intra-abdominal or pelvic process. 2. Unusual lobulated contour of the liver, similar to previous exam, and of uncertain etiology. Again, further assessment with liver protocol MRI is suggested for complete evaluation if not already performed. Additionally, correlation with LFTs suggested as well. Electronically Signed   By: BJeannine BogaM.D.   On: 08/12/2017 05:34    Procedures Procedures (including critical  care time)  Medications Ordered in ED Medications - No data to display   Initial Impression / Assessment and Plan / ED Course  I have reviewed the triage vital signs and the nursing notes.  Pertinent labs & imaging results that were available during my care of the patient were reviewed by me and considered in my medical decision making (see chart for details).    Patient with ulcerative colitis presenting with a several day history of right-sided abdominal pain and diarrhea.  No fever or vomiting.  Patient is well-hydrated in no distress.  Abdomen exam is benign.  Labs are reassuring.  No fever or leukocytosis. CT scan shows no evidence of active ulcerative colitis.  Appendix is normal.    No indication for steroids at this time.  We will treat symptomatically at this time.  Patient and mother reassured.  Follow-up with PCP as well as GI specialist.  Return precautions discussed.  Final Clinical Impressions(s) / ED Diagnoses   Final diagnoses:  Right sided abdominal pain    ED Discharge Orders    None       , Annie Main, MD 08/12/17 [phone]

## 2017-08-17 ENCOUNTER — Encounter: Payer: Self-pay | Admitting: Family Medicine

## 2017-08-19 ENCOUNTER — Encounter
Admit: 2017-08-19 | Discharge: 2017-08-19 | Payer: MEDICAID | Attending: Certified Registered" | Primary: Certified Registered"

## 2017-08-19 ENCOUNTER — Ambulatory Visit: Admit: 2017-08-19 | Discharge: 2017-08-19 | Payer: MEDICAID

## 2017-08-19 DIAGNOSIS — K5120 Ulcerative (chronic) proctitis without complications: Principal | ICD-10-CM

## 2017-08-19 MED ORDER — MESALAMINE 1,000 MG RECTAL SUPPOSITORY
Freq: Every evening | RECTAL | 0 refills | 0 days | Status: CP
Start: 2017-08-19 — End: 2017-09-02

## 2017-08-20 ENCOUNTER — Encounter (HOSPITAL_COMMUNITY): Payer: Self-pay

## 2017-08-20 ENCOUNTER — Emergency Department (HOSPITAL_COMMUNITY): Payer: Medicaid Other

## 2017-08-20 ENCOUNTER — Emergency Department (HOSPITAL_COMMUNITY)
Admission: EM | Admit: 2017-08-20 | Discharge: 2017-08-20 | Disposition: A | Discharge status: Home / Self Care | Payer: Medicaid Other | Attending: Emergency Medicine | Admitting: Emergency Medicine

## 2017-08-20 DIAGNOSIS — R109 Unspecified abdominal pain: Secondary | ICD-10-CM

## 2017-08-20 DIAGNOSIS — J45909 Unspecified asthma, uncomplicated: Secondary | ICD-10-CM | POA: Diagnosis not present

## 2017-08-20 DIAGNOSIS — Z87891 Personal history of nicotine dependence: Secondary | ICD-10-CM | POA: Insufficient documentation

## 2017-08-20 DIAGNOSIS — R1110 Vomiting, unspecified: Secondary | ICD-10-CM | POA: Diagnosis not present

## 2017-08-20 DIAGNOSIS — Z79899 Other long term (current) drug therapy: Secondary | ICD-10-CM | POA: Diagnosis not present

## 2017-08-20 LAB — CBC WITH DIFFERENTIAL/PLATELET
Basophils Absolute: 0 10*3/uL (ref 0.0–0.1)
Basophils Relative: 0 %
Eosinophils Absolute: 0.1 10*3/uL (ref 0.0–0.7)
Eosinophils Relative: 2 %
HCT: 36.3 % — ABNORMAL LOW (ref 39.0–52.0)
Hemoglobin: 12 g/dL — ABNORMAL LOW (ref 13.0–17.0)
Lymphocytes Relative: 40 %
Lymphs Abs: 2.1 10*3/uL (ref 0.7–4.0)
MCH: 26.2 pg (ref 26.0–34.0)
MCHC: 33.1 g/dL (ref 30.0–36.0)
MCV: 79.3 fL (ref 78.0–100.0)
Monocytes Absolute: 0.8 10*3/uL (ref 0.1–1.0)
Monocytes Relative: 16 %
Neutro Abs: 2.2 10*3/uL (ref 1.7–7.7)
Neutrophils Relative %: 42 %
Platelets: 235 10*3/uL (ref 150–400)
RBC: 4.58 MIL/uL (ref 4.22–5.81)
RDW: 14.9 % (ref 11.5–15.5)
WBC: 5.2 10*3/uL (ref 4.0–10.5)

## 2017-08-20 LAB — COMPREHENSIVE METABOLIC PANEL
ALT: 19 U/L (ref 17–63)
AST: 24 U/L (ref 15–41)
Albumin: 3.9 g/dL (ref 3.5–5.0)
Alkaline Phosphatase: 65 U/L (ref 38–126)
Anion gap: 8 (ref 5–15)
BUN: 8 mg/dL (ref 6–20)
CO2: 24 mmol/L (ref 22–32)
Calcium: 9.4 mg/dL (ref 8.9–10.3)
Chloride: 107 mmol/L (ref 101–111)
Creatinine, Ser: 0.91 mg/dL (ref 0.61–1.24)
GFR calc Af Amer: >60 mL/min (ref >60)
GFR calc non Af Amer: >60 mL/min (ref >60)
Glucose, Bld: 90 mg/dL (ref 65–99)
Potassium: 3.6 mmol/L (ref 3.5–5.1)
Sodium: 139 mmol/L (ref 135–145)
Total Bilirubin: 0.5 mg/dL (ref 0.3–1.2)
Total Protein: 7 g/dL (ref 6.5–8.1)

## 2017-08-20 LAB — LIPASE, BLOOD: Lipase: 34 U/L (ref 11–51)

## 2017-08-20 MED ORDER — ONDANSETRON 4 MG PO TBDP
4.0000 mg | ORAL_TABLET | Freq: Once | ORAL | Status: AC
  Administered 2017-08-20: 4 mg via ORAL
  Filled 2017-08-20: qty 1

## 2017-08-20 MED ORDER — HYDROCODONE-ACETAMINOPHEN 5-325 MG PO TABS
1.0000 | ORAL_TABLET | Freq: Once | ORAL | Status: AC
  Administered 2017-08-20: 1 via ORAL
  Filled 2017-08-20: qty 1

## 2017-08-20 NOTE — ED Triage Notes (Signed)
Pt had a colonoscopy Wednesday approx 1430, got home and felt fine until about 12 midnight when he started having generalized abd pain and vomiting.

## 2017-08-20 NOTE — ED Notes (Signed)
Pt able to drink whole can of Sprite. Denies nausea or vomiting.

## 2017-08-20 NOTE — Discharge Instructions (Signed)
Your x-ray is normal.  Follow-up with your doctor.  Return to the ED if you develop worsening pain, vomiting or any other concerns.

## 2017-08-20 NOTE — ED Provider Notes (Signed)
[facility] EMERGENCY DEPARTMENT Provider Note   CSN: [id number] Arrival date & time: 08/20/17  0100     History   Chief Complaint Chief Complaint  Patient presents with  . Abdominal Pain    HPI Wayne Avila is a 27 y.o. male.  Patient with history of ulcerative colitis on immune modulator presenting with diffuse abdominal pain that onset around 10 PM.  Patient underwent a colonoscopy at [facility] on the afternoon of March 13.  He felt well up until several hours ago when he developed diffuse abdominal pain with 2 episodes of vomiting.  He called his GI doctor at [facility] and was told to come to the ED.  States he was feeling well before this.  No fever, chills, pain with urination or blood in the urine.  No previous abdominal surgeries.  This pain is different than his previous ulcerative colitis pain.  His last infusion of vedolizumab was 6 weeks ago.   The history is provided by the patient and a relative.  Abdominal Pain   Associated symptoms include nausea and vomiting. Pertinent negatives include fever, diarrhea, dysuria, hematuria, headaches and arthralgias.    Past Medical History:  Diagnosis Date  . ADHD (attention deficit hyperactivity disorder)   . Anxiety   . Asthma   . Autoimmune hepatitis ([place])   . Bipolar 1 disorder ([place])   . C. difficile colitis 03/22/2016  . Clostridium difficile colitis   . Depression   . GERD (gastroesophageal reflux disease)   . HOH (hard of hearing)   . Mental developmental delay   . Seizures ([place])   . Ulcerative colitis [facility])     Patient Active Problem List   Diagnosis Date Noted  . ADHD 01/09/2017  . Environmental allergies 01/09/2017  . Class 1 obesity due to excess calories with body mass index (BMI) of 34.0 to 34.9 in adult 01/09/2017  . Autoimmune hepatitis ([place]) 01/02/2017  . Asthma in adult, mild intermittent, uncomplicated 08/65/7846  . Anemia 12/31/2016  . Hyperammonemia ([place]) 12/31/2016  . Elevated LFTs 12/31/2016   . Abnormal thyroid function test 03/24/2016  . Ulcerative colitis ([place]) 12/29/2015  . Bipolar 1 disorder, manic, moderate ([place]) 11/09/2014  . Mental developmental delay 11/15/2012    Past Surgical History:  Procedure Laterality Date  . ADENOIDECTOMY    . BIOPSY N/A 12/07/2012   Procedure: GASTRIC BIOPSIES;  Surgeon: [name], MD;  Location: [facility] ORS;  Service: Endoscopy;  Laterality: N/A;  . BIOPSY N/A 08/30/2013   Procedure: BIOPSY;  Surgeon: [name], MD;  Location: [facility] ORS;  Service: Endoscopy;  Laterality: N/A;  right colon,transverse colon, left colon, rectal biopsies  . BIOPSY  11/20/2015   Procedure: BIOPSY;  Surgeon: [name], MD;  Location: [facility] ENDO SUITE;  Service: Endoscopy;;  ileal, right colon biopsy, left colon, rectum  . BIOPSY  04/18/2016   Procedure: BIOPSY;  Surgeon: [name], MD;  Location: [facility] ENDO SUITE;  Service: Endoscopy;;  left descending colon biopsies  . COLONOSCOPY  11/20/2015   Dr. [name]: Severe erythema, edema, ulcers from the anal verge to 20 cm above the anal verge without mucosal sparing, remaining colon and terminal ileum appeared normal. Biopsies from the rectum revealed fulminant active chronic colitis consistent with IBD. Pathology from terminal ileum revealed intramucosal lymphoid aggregates. Remaining colon random biopsies with inactive chronic colitis  . COLONOSCOPY WITH PROPOFOL N/A 08/30/2013   NGE:XBMWUX mucosa in the terminal ileum/COLITIS/ MILD PROCTITIS. 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  Marland Kitchen COLONOSCOPY WITH PROPOFOL N/A 11/20/2015   Dr. 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 ESOPHAGOGASTRODUODENOSCOPY  11/20/2015   Dr. [name]: Normal exam, stomach biopsied and duodenal biopsy.. Benign biopsies.  . ESOPHAGOGASTRODUODENOSCOPY (EGD) WITH PROPOFOL N/A 12/07/2012   SLF:The mucosa  of the esophagus appeared normal Non-erosive gastritis (inflammation) was found in the gastric antrum; multiple biopsies The duodenal mucosa showed no abnormalities in the bulb and second portion of the duodenum  . ESOPHAGOGASTRODUODENOSCOPY (EGD) WITH PROPOFOL N/A 11/20/2015   Dr. [name]: normal with normal biopsies, negative H.pylori   . ESOPHAGOGASTRODUODENOSCOPY (EGD) WITH PROPOFOL N/A 04/18/2016   Procedure: ESOPHAGOGASTRODUODENOSCOPY (EGD) WITH PROPOFOL;  Surgeon: [name], MD;  Location: [facility];  Service: Endoscopy;  Laterality: N/A;  . FLEXIBLE SIGMOIDOSCOPY N/A 04/18/2016   Procedure: FLEXIBLE SIGMOIDOSCOPY;  Surgeon: [name], MD;  Location: [facility] END[facility];  Service: Endoscopy;  Laterality: N/A;  . TONSILLECTOMY         Home Medications    Prior to Admission medications   Medication Sig Start Date End Date Taking? Authorizing Provider  albuterol (PROVENTIL HFA) 108 (90 Base) MCG/ACT inhaler Inhale into the lungs.    [provider]  albuterol (PROVENTIL) (2.5 MG/3ML) 0.083% nebulizer solution Inhale 3 mLs (2.5 mg total) into the lungs every 6 (six) hours as needed for wheezing or shortness of breath. 04/29/17   Raylene Everts, MD  cholecalciferol (VITAMIN D) 1000 units tablet Take 1 tablet (1,000 Units total) by mouth daily. START AFTER VITAMIN D 50,000 U TABLETS ARE COMPLETE 04/08/16   Fields, Marga Melnick, MD  cloNIDine HCl (KAPVAY) 0.1 MG TB12 ER tablet Take 0.1 mg by mouth 2 (two) times daily.    [provider]  dicyclomine (BENTYL) 20 MG tablet Take 1 tablet (20 mg total) by mouth 2 (two) times daily. 08/12/17   Haider Hornaday, Annie Main, MD  diphenoxylate-atropine (LOMOTIL) 2.5-0.025 MG tablet Take 1 tablet by mouth 4 (four) times daily as needed for diarrhea or loose stools. 07/29/17   Raylene Everts, MD  EPINEPHrine (EPIPEN 2-PAK) 0.3 mg/0.3 mL IJ SOAJ injection Inject 0.3 mg into the muscle once as needed (for severe allergic reaction).     [provider]  famotidine (PEPCID) 20 MG tablet Take 1 tablet (20 mg total) by mouth 2 (two) times daily. Patient taking differently: Take 20 mg by mouth daily.  02/21/17   Milton Ferguson, MD  lactulose [facility]) 10 GM/15ML solution Take by mouth. 01/02/17   [provider]  lidocaine (XYLOCAINE) 2 % solution 10 mL by Mouth route every three (3) hours as needed.    [provider]  loperamide (IMODIUM) 2 MG capsule Take 1 capsule (2 mg total) by mouth 4 (four) times daily as needed for diarrhea or loose stools. 03/19/17   Horton, Barbette Hair, MD  loratadine (CLARITIN) 10 MG tablet Take 10 mg by mouth. 01/09/17   [provider]  metoCLOPramide (REGLAN) 10 MG tablet Take 1 tablet (10 mg total) by mouth every 6 (six) hours as needed for nausea (nausea/headache). 12/23/16   Daleen Bo, MD  montelukast (SINGULAIR) 10 MG tablet Take 10 mg by mouth. 01/09/17   [provider]  neomycin-polymyxin-hydrocortisone (CORTISPORIN) OTIC solution Place 3 drops into the right ear 4 (four) times daily. 07/29/17   Raylene Everts, MD  ondansetron (ZOFRAN ODT) 8 MG disintegrating tablet  42m ODT q4 hours prn nausea 07/29/17   NRaylene Everts MD  Oxcarbazepine (TRILEPTAL) 300 MG tablet Take one tablet by mouth twice daily/as directed per prescriber 01/06/17   [provider]  paliperidone (INVEGA) 3 MG 24 hr tablet Take 3 mg by mouth daily.    [provider]  Respiratory Therapy Supplies (NEBULIZER COMPRESSOR) KIT And supplies 04/29/17   NRaylene Everts MD  sertraline (ZOLOFT) 50 MG tablet Take 75 mg by mouth daily.    [provider]    Family History Family History  Problem Relation Age of Onset  . Asthma Mother   . Ulcers Mother   . Bipolar disorder Mother   . ADD / ADHD Father   . Diabetes Maternal Grandmother   . Hypertension Maternal Grandmother   . Sleep apnea Maternal Grandmother   . Heart disease Maternal Grandmother   .  Diabetes Maternal Grandfather   . Hypertension Maternal Grandfather   . Sleep apnea Maternal Grandfather   . Heart disease Maternal Grandfather   . Cancer Maternal Grandfather        prostate  . Cancer Maternal Aunt        Leukemia  . Colon cancer Neg Hx   . Liver disease Neg Hx     Social History Social History   Tobacco Use  . Smoking status: Former Smoker    Packs/day: 0.00    Years: 2.00    Pack years: 0.00    Last attempt to quit: 10/06/2012    Years since quitting: 4.8  . Smokeless tobacco: Never Used  . Tobacco comment: Never smoked cigarettes  Substance Use Topics  . Alcohol use: No    Alcohol/week: 0.0 oz    Comment: denies usage  . Drug use: No     Allergies   Nsaids; Pineapple; Remicade [infliximab]; Strawberry extract; Amoxicillin-pot clavulanate; Omeprazole; and Tomato   Review of Systems Review of Systems  Constitutional: Positive for activity change and appetite change. Negative for fever.  HENT: Negative for congestion.   Respiratory: Negative for cough, chest tightness and shortness of breath.   Cardiovascular: Negative for chest pain.  Gastrointestinal: Positive for abdominal pain, nausea and vomiting. Negative for diarrhea.  Genitourinary: Negative for dysuria, hematuria and testicular pain.  Musculoskeletal: Negative for arthralgias and joint swelling.  Skin: Negative for rash.  Neurological: Negative for dizziness, weakness and headaches.    all other systems are negative except as noted in the HPI and PMH.    Physical Exam Updated Vital Signs BP 125/79 (BP Location: Left Arm)   Pulse 74   Temp 99 F (37.2 C) (Oral)   Resp 20   SpO2 98%   Physical Exam  Constitutional: He is oriented to person, place, and time. He appears well-developed and well-nourished. No distress.  HENT:  Head: Normocephalic and atraumatic.  Mouth/Throat: Oropharynx is clear and moist. No oropharyngeal exudate.  Eyes: Conjunctivae and EOM are normal. Pupils are  equal, round, and reactive to light.  Neck: Normal range of motion. Neck supple.  No meningismus.  Cardiovascular: Normal rate, regular rhythm, normal heart sounds and intact distal pulses.  No murmur heard. Pulmonary/Chest: Effort normal and breath sounds normal. No respiratory distress.  Abdominal: Soft. There is tenderness. There is no rebound and no guarding.  Soft, diffuse tenderness, worse in the left side, no guarding or rebound  Musculoskeletal: Normal range of motion. He exhibits no edema or tenderness.  Neurological: He is alert and oriented to person, place, and  time. No cranial nerve deficit. He exhibits normal muscle tone. Coordination normal.  No ataxia on finger to nose bilaterally. No pronator drift. 5/5 strength throughout. CN 2-12 intact.Equal grip strength. Sensation intact.   Skin: Skin is warm.  Psychiatric: He has a normal mood and affect. His behavior is normal.  Nursing note and vitals reviewed.    ED Treatments / Results  Labs (all labs ordered are listed, but only abnormal results are displayed) Labs Reviewed  CBC WITH DIFFERENTIAL/PLATELET - Abnormal; Notable for the following components:      Result Value   Hemoglobin 12.0 (*)    HCT 36.3 (*)    All other components within normal limits  COMPREHENSIVE METABOLIC PANEL  LIPASE, BLOOD    EKG  EKG Interpretation None       Radiology Dg Abdomen Acute W/chest  Result Date: 08/20/2017 CLINICAL DATA:  27 year old male with pain after colonoscopy. EXAM: DG ABDOMEN ACUTE W/ 1V CHEST COMPARISON:  Abdominal CT dated 08/12/2017 FINDINGS: There is no evidence of dilated bowel loops or free intraperitoneal air. No radiopaque calculi or other significant radiographic abnormality is seen. Heart size and mediastinal contours are within normal limits. Both lungs are clear. IMPRESSION: Negative abdominal radiographs.  No acute cardiopulmonary disease. Electronically Signed   By: Anner Crete M.D.   On: 08/20/2017  02:33    Procedures Procedures (including critical care time)  Medications Ordered in ED Medications  ondansetron (ZOFRAN-ODT) disintegrating tablet 4 mg (not administered)  HYDROcodone-acetaminophen (NORCO/VICODIN) 5-325 MG per tablet 1 tablet (not administered)     Initial Impression / Assessment and Plan / ED Course  I have reviewed the triage vital signs and the nursing notes.  Pertinent labs & imaging results that were available during my care of the patient were reviewed by me and considered in my medical decision making (see chart for details).    Patient with history of ulcerative colitis presenting with abdominal pain and vomiting after colonoscopy yesterday.  Abdomen is soft without peritoneal signs.  Colonoscopy results Exudate in the entire examined colon.   - The examined portion of the ileum was normal.   - Friability with no bleeding in the sigmoid colon.   - Mild (Mayo Score 1) proctitis ulcerative colitis,    improved since the last examination.   - No specimens collected.    Acute abdominal series is negative.  No free air.  Abdomen is soft without peritoneal signs.  Labs are stable.  Pain has improved.  Patient is tolerating p.o.  On reexam he states his pain is gone.  His abdomen is soft without peritoneal signs.  Had a CT scan last week which is reassuring.  No evidence of post colonoscopy complication.  Acute abdominal series is negative.  He is tolerating p.o. and abdomen is soft.  Will defer CT scan at this time.  CT scan last week was reassuring.  There is no evidence of free air on acute abdominal series today and he has no peritoneal signs.  Advised follow-up with his GI doctor.  Return to the ED if symptoms worsen. Final Clinical Impressions(s) / ED Diagnoses   Final diagnoses:  Abdominal pain, unspecified abdominal location    ED Discharge Orders     None       Jessica Checketts, Annie Main, MD 08/20/17 [id number]

## 2017-08-26 ENCOUNTER — Encounter (HOSPITAL_COMMUNITY): Payer: Self-pay | Admitting: Emergency Medicine

## 2017-08-26 ENCOUNTER — Other Ambulatory Visit: Payer: Self-pay

## 2017-08-26 ENCOUNTER — Emergency Department (HOSPITAL_COMMUNITY)
Admission: EM | Admit: 2017-08-26 | Discharge: 2017-08-27 | Disposition: A | Discharge status: Home / Self Care | Payer: Medicaid Other | Attending: Emergency Medicine | Admitting: Emergency Medicine

## 2017-08-26 DIAGNOSIS — F79 Unspecified intellectual disabilities: Secondary | ICD-10-CM | POA: Insufficient documentation

## 2017-08-26 DIAGNOSIS — F909 Attention-deficit hyperactivity disorder, unspecified type: Secondary | ICD-10-CM | POA: Diagnosis not present

## 2017-08-26 DIAGNOSIS — R51 Headache: Secondary | ICD-10-CM | POA: Diagnosis not present

## 2017-08-26 DIAGNOSIS — Z87891 Personal history of nicotine dependence: Secondary | ICD-10-CM | POA: Insufficient documentation

## 2017-08-26 DIAGNOSIS — J45909 Unspecified asthma, uncomplicated: Secondary | ICD-10-CM | POA: Insufficient documentation

## 2017-08-26 DIAGNOSIS — Z79899 Other long term (current) drug therapy: Secondary | ICD-10-CM | POA: Diagnosis not present

## 2017-08-26 DIAGNOSIS — R519 Headache, unspecified: Secondary | ICD-10-CM

## 2017-08-26 NOTE — ED Triage Notes (Signed)
Pt c/o headache since 1700 and took tylenol with no relief. Pt also c/o nausea but denies any vomiting.

## 2017-08-27 ENCOUNTER — Emergency Department (HOSPITAL_COMMUNITY): Payer: Medicaid Other

## 2017-08-27 MED ORDER — PROCHLORPERAZINE EDISYLATE 5 MG/ML IJ SOLN
5.0000 mg | Freq: Once | INTRAMUSCULAR | Status: AC
  Administered 2017-08-27: 5 mg via INTRAVENOUS
  Filled 2017-08-27: qty 2

## 2017-08-27 MED ORDER — DIPHENHYDRAMINE HCL 50 MG/ML IJ SOLN
25.0000 mg | Freq: Once | INTRAMUSCULAR | Status: AC
  Administered 2017-08-27: 25 mg via INTRAVENOUS
  Filled 2017-08-27: qty 1

## 2017-08-27 MED ORDER — MORPHINE SULFATE (PF) 4 MG/ML IV SOLN
4.0000 mg | Freq: Once | INTRAVENOUS | Status: AC
  Administered 2017-08-27: 4 mg via INTRAVENOUS
  Filled 2017-08-27: qty 1

## 2017-08-27 NOTE — ED Provider Notes (Signed)
[facility] EMERGENCY DEPARTMENT Provider Note   CSN: [id number] Arrival date & time: 08/26/17  2307     History   Chief Complaint Chief Complaint  Patient presents with  . Headache    HPI Wayne Avila is a 27 y.o. male.  Patient is a 27 year old male who presents to the emergency department with a complaint of headache.  Patient states his headache started around 5:00 this afternoon.  The patient denies any recent changes in diet.  No significant changes in medications.  He is not had any recent injury or trauma.  He has had some upper respiratory symptoms with nasal congestion.  Patient states he had a similar headache when he had a migraine sometime ago.  Patient tried Tylenol before coming to the emergency department but this did not help.  Patient has problem with nausea, but has not been vomiting.      Past Medical History:  Diagnosis Date  . ADHD (attention deficit hyperactivity disorder)   . Anxiety   . Asthma   . Autoimmune hepatitis ([place])   . Bipolar 1 disorder ([place])   . C. difficile colitis 03/22/2016  . Clostridium difficile colitis   . Depression   . GERD (gastroesophageal reflux disease)   . HOH (hard of hearing)   . Mental developmental delay   . Seizures ([place])   . Ulcerative colitis [facility])     Patient Active Problem List   Diagnosis Date Noted  . ADHD 01/09/2017  . Environmental allergies 01/09/2017  . Class 1 obesity due to excess calories with body mass index (BMI) of 34.0 to 34.9 in adult 01/09/2017  . Autoimmune hepatitis ([place]) 01/02/2017  . Asthma in adult, mild intermittent, uncomplicated 42/70/6237  . Anemia 12/31/2016  . Hyperammonemia ([place]) 12/31/2016  . Elevated LFTs 12/31/2016  . Abnormal thyroid function test 03/24/2016  . Ulcerative colitis ([place]) 12/29/2015  . Bipolar 1 disorder, manic, moderate ([place]) 11/09/2014  . Mental developmental delay 11/15/2012    Past Surgical History:  Procedure Laterality Date  . ADENOIDECTOMY    .  BIOPSY N/A 12/07/2012   Procedure: GASTRIC BIOPSIES;  Surgeon: [name], MD;  Location: [facility] ORS;  Service: Endoscopy;  Laterality: N/A;  . BIOPSY N/A 08/30/2013   Procedure: BIOPSY;  Surgeon: [name], MD;  Location: [facility] ORS;  Service: Endoscopy;  Laterality: N/A;  right colon,transverse colon, left colon, rectal biopsies  . BIOPSY  11/20/2015   Procedure: BIOPSY;  Surgeon: [name], MD;  Location: [facility] ENDO SUITE;  Service: Endoscopy;;  ileal, right colon biopsy, left colon, rectum  . BIOPSY  04/18/2016   Procedure: BIOPSY;  Surgeon: [name], MD;  Location: [facility] END[facility] SU[facility];  Service: Endoscopy;;  left descending colon biopsies  . COLONOSCOPY  11/20/2015   Dr. [name]: Severe erythema, edema, ulcers from the anal verge to 20 cm above the anal verge without mucosal sparing, remaining colon and terminal ileum appeared normal. Biopsies from the rectum revealed fulminant active chronic colitis consistent with IBD. Pathology from terminal ileum revealed intramucosal lymphoid aggregates. Remaining colon random biopsies with inactive chronic colitis  . COLONOSCOPY WITH PROPOFOL N/A 08/30/2013   SEG:BTDVVO mucosa in the terminal ileum/COLITIS/ MILD PROCTITIS. 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  Marland Kitchen COLONOSCOPY WITH PROPOFOL N/A 11/20/2015   Dr. 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 ESOPHAGOGASTRODUODENOSCOPY  11/20/2015   Dr. [name]:  Normal exam, stomach biopsied and duodenal biopsy.. Benign biopsies.  . ESOPHAGOGASTRODUODENOSCOPY (EGD) WITH PROPOFOL N/A 12/07/2012   SLF:The mucosa of the esophagus appeared normal Non-erosive gastritis (inflammation) was found in the gastric antrum; multiple biopsies The duodenal mucosa showed no abnormalities in the bulb and second portion of the duodenum  . ESOPHAGOGASTRODUODENOSCOPY (EGD) WITH PROPOFOL  N/A 11/20/2015   Dr. [name]: normal with normal biopsies, negative H.pylori   . ESOPHAGOGASTRODUODENOSCOPY (EGD) WITH PROPOFOL N/A 04/18/2016   Procedure: ESOPHAGOGASTRODUODENOSCOPY (EGD) WITH PROPOFOL;  Surgeon: [name], MD;  Location[facility];  Service: Endoscopy;  Laterality: N/A;  . FLEXIBLE SIGMOIDOSCOPY N/A 04/18/2016   Procedure: FLEXIBLE SIGMOIDOSCOPY;  Surgeon: [name], MD;  Location: [facility];  Service: Endoscopy;  Laterality: N/A;  . TONSILLECTOMY         Home Medications    Prior to Admission medications   Medication Sig Start Date End Date Taking? Authorizing Provider  albuterol (PROVENTIL HFA) 108 (90 Base) MCG/ACT inhaler Inhale into the lungs.    [provider]  albuterol (PROVENTIL) (2.5 MG/3ML) 0.083% nebulizer solution Inhale 3 mLs (2.5 mg total) into the lungs every 6 (six) hours as needed for wheezing or shortness of breath. 04/29/17   Raylene Everts, MD  cholecalciferol (VITAMIN D) 1000 units tablet Take 1 tablet (1,000 Units total) by mouth daily. START AFTER VITAMIN D 50,000 U TABLETS ARE COMPLETE 04/08/16   Fields, Marga Melnick, MD  cloNIDine HCl (KAPVAY) 0.1 MG TB12 ER tablet Take 0.1 mg by mouth 2 (two) times daily.    [provider]  dicyclomine (BENTYL) 20 MG tablet Take 1 tablet (20 mg total) by mouth 2 (two) times daily. 08/12/17   Rancour, Annie Main, MD  diphenoxylate-atropine (LOMOTIL) 2.5-0.025 MG tablet Take 1 tablet by mouth 4 (four) times daily as needed for diarrhea or loose stools. 07/29/17   Raylene Everts, MD  EPINEPHrine (EPIPEN 2-PAK) 0.3 mg/0.3 mL IJ SOAJ injection Inject 0.3 mg into the muscle once as needed (for severe allergic reaction).    [provider]  famotidine (PEPCID) 20 MG tablet Take 1 tablet (20 mg total) by mouth 2 (two) times daily. Patient taking differently: Take 20 mg by mouth daily.  02/21/17   Milton Ferguson, MD  lactulose [facility]) 10 GM/15ML solution Take by mouth. 01/02/17    [provider]  lidocaine (XYLOCAINE) 2 % solution 10 mL by Mouth route every three (3) hours as needed.    [provider]  loperamide (IMODIUM) 2 MG capsule Take 1 capsule (2 mg total) by mouth 4 (four) times daily as needed for diarrhea or loose stools. 03/19/17   Horton, Barbette Hair, MD  loratadine (CLARITIN) 10 MG tablet Take 10 mg by mouth. 01/09/17   [provider]  metoCLOPramide (REGLAN) 10 MG tablet Take 1 tablet (10 mg total) by mouth every 6 (six) hours as needed for nausea (nausea/headache). 12/23/16   Daleen Bo, MD  montelukast (SINGULAIR) 10 MG tablet Take 10 mg by mouth. 01/09/17   [provider]  neomycin-polymyxin-hydrocortisone (CORTISPORIN) OTIC solution Place 3 drops into the right ear 4 (four) times daily. 07/29/17   Raylene Everts, MD  ondansetron (ZOFRAN ODT) 8 MG disintegrating tablet 31m ODT q4 hours prn nausea 07/29/17   NRaylene Everts MD  Oxcarbazepine (TRILEPTAL) 300 MG tablet Take one tablet by mouth twice daily/as directed per prescriber 01/06/17   [provider]  paliperidone (INVEGA) 3 MG 24 hr tablet Take 3  mg by mouth daily.    [provider]  Respiratory Therapy Supplies (NEBULIZER COMPRESSOR) KIT And supplies 04/29/17   Raylene Everts, MD  sertraline (ZOLOFT) 50 MG tablet Take 75 mg by mouth daily.    [provider]    Family History Family History  Problem Relation Age of Onset  . Asthma Mother   . Ulcers Mother   . Bipolar disorder Mother   . ADD / ADHD Father   . Diabetes Maternal Grandmother   . Hypertension Maternal Grandmother   . Sleep apnea Maternal Grandmother   . Heart disease Maternal Grandmother   . Diabetes Maternal Grandfather   . Hypertension Maternal Grandfather   . Sleep apnea Maternal Grandfather   . Heart disease Maternal Grandfather   . Cancer Maternal Grandfather        prostate  . Cancer Maternal Aunt        Leukemia  . Colon cancer Neg Hx   .  Liver disease Neg Hx     Social History Social History   Tobacco Use  . Smoking status: Former Smoker    Packs/day: 0.00    Years: 2.00    Pack years: 0.00    Last attempt to quit: 10/06/2012    Years since quitting: 4.8  . Smokeless tobacco: Never Used  . Tobacco comment: Never smoked cigarettes  Substance Use Topics  . Alcohol use: No    Alcohol/week: 0.0 oz    Comment: denies usage  . Drug use: No     Allergies   Nsaids; Pineapple; Remicade [infliximab]; Strawberry extract; Amoxicillin-pot clavulanate; Omeprazole; and Tomato   Review of Systems Review of Systems  Constitutional: Negative for activity change.       All ROS Neg except as noted in HPI  HENT: Negative for nosebleeds.   Eyes: Negative for photophobia and discharge.  Respiratory: Negative for cough, shortness of breath and wheezing.   Cardiovascular: Negative for chest pain and palpitations.  Gastrointestinal: Positive for nausea. Negative for abdominal pain and blood in stool.  Genitourinary: Negative for dysuria, frequency and hematuria.  Musculoskeletal: Negative for arthralgias, back pain and neck pain.  Skin: Negative.   Neurological: Positive for headaches. Negative for dizziness, seizures and speech difficulty.  Psychiatric/Behavioral: Negative for confusion and hallucinations.     Physical Exam Updated Vital Signs BP 127/82   Pulse 74   Temp 99.1 F (37.3 C) (Oral)   Resp 20   Ht 5' 8"  (1.727 m) Comment: Simultaneous filing. User may not have seen previous data.  Wt 119.3 kg (263 lb) Comment: Simultaneous filing. User may not have seen previous data.  SpO2 97%   BMI 39.99 kg/m   Physical Exam  Constitutional: He appears well-developed and well-nourished. No distress.  HENT:  Head: Normocephalic and atraumatic.  Right Ear: External ear normal.  Left Ear: External ear normal.  Eyes: Conjunctivae are normal. Right eye exhibits no discharge. Left eye exhibits no discharge. No scleral  icterus.  Neck: Neck supple. No tracheal deviation present.  Cardiovascular: Normal rate, regular rhythm and intact distal pulses.  Pulmonary/Chest: Effort normal and breath sounds normal. No stridor. No respiratory distress. He has no wheezes. He has no rales.  Abdominal: Soft. Bowel sounds are normal. He exhibits no distension. There is no tenderness. There is no rebound and no guarding.  Musculoskeletal: He exhibits no edema or tenderness.  Neurological: He is alert. He has normal strength. No cranial nerve deficit (no facial droop, extraocular movements intact, no  slurred speech) or sensory deficit. He exhibits normal muscle tone. He displays no seizure activity. Coordination normal.  Skin: Skin is warm and dry. No rash noted.  Psychiatric: He has a normal mood and affect.  Nursing note and vitals reviewed.    ED Treatments / Results  Labs (all labs ordered are listed, but only abnormal results are displayed) Labs Reviewed - No data to display  EKG  EKG Interpretation None       Radiology No results found.  Procedures Procedures (including critical care time)  Medications Ordered in ED Medications  prochlorperazine (COMPAZINE) injection 5 mg (5 mg Intravenous Given 08/27/17 0032)  diphenhydrAMINE (BENADRYL) injection 25 mg (25 mg Intravenous Given 08/27/17 0032)  morphine 4 MG/ML injection 4 mg (4 mg Intravenous Given 08/27/17 0033)     Initial Impression / Assessment and Plan / ED Course  I have reviewed the triage vital signs and the nursing notes.  Pertinent labs & imaging results that were available during my care of the patient were reviewed by me and considered in my medical decision making (see chart for details).       Final Clinical Impressions(s) / ED Diagnoses MDM  Vital signs reviewed.  Pulse oximetry is 97% on room air.  Within normal limits by my interpretation.  No gross neurologic deficits appreciated on examination.  Patient treated in the  emergency department with IV Compazine, Benadryl, and 4 mg of morphine.  Recheck.  Patient sleeping.  Headache improved, but not completely resolved.  No gross neurologic deficits appreciated on recheck.  Prescription for Ultram and promethazine given to the patient to use.  The patient is to follow-up with Dr. Meda Coffee in the office.  The patient will return to the emergency department if any changes in condition, problems, or concerns.   Final diagnoses:  Bad headache    ED Discharge Orders    None       Lily Kocher, PA-C 08/27/17 1349    Rolland Porter, MD 08/27/17 [phone]

## 2017-08-27 NOTE — ED Notes (Addendum)
Pt ambulatory to waiting room. Pt verbalized understanding of discharge instructions. Prescriptions reviewed.

## 2017-08-28 ENCOUNTER — Emergency Department (HOSPITAL_COMMUNITY)
Admission: EM | Admit: 2017-08-28 | Discharge: 2017-08-28 | Disposition: A | Discharge status: Home / Self Care | Payer: Medicaid Other | Attending: Emergency Medicine | Admitting: Emergency Medicine

## 2017-08-28 ENCOUNTER — Other Ambulatory Visit: Payer: Self-pay

## 2017-08-28 ENCOUNTER — Ambulatory Visit: Payer: Self-pay | Admitting: Family Medicine

## 2017-08-28 ENCOUNTER — Emergency Department (HOSPITAL_COMMUNITY): Payer: Medicaid Other

## 2017-08-28 ENCOUNTER — Encounter (HOSPITAL_COMMUNITY): Payer: Self-pay | Admitting: Emergency Medicine

## 2017-08-28 DIAGNOSIS — Z87891 Personal history of nicotine dependence: Secondary | ICD-10-CM | POA: Insufficient documentation

## 2017-08-28 DIAGNOSIS — K529 Noninfective gastroenteritis and colitis, unspecified: Secondary | ICD-10-CM | POA: Diagnosis not present

## 2017-08-28 DIAGNOSIS — R109 Unspecified abdominal pain: Secondary | ICD-10-CM | POA: Diagnosis present

## 2017-08-28 DIAGNOSIS — J45909 Unspecified asthma, uncomplicated: Secondary | ICD-10-CM | POA: Diagnosis not present

## 2017-08-28 DIAGNOSIS — F319 Bipolar disorder, unspecified: Secondary | ICD-10-CM | POA: Diagnosis not present

## 2017-08-28 DIAGNOSIS — F419 Anxiety disorder, unspecified: Secondary | ICD-10-CM | POA: Diagnosis not present

## 2017-08-28 DIAGNOSIS — Z79899 Other long term (current) drug therapy: Secondary | ICD-10-CM | POA: Diagnosis not present

## 2017-08-28 DIAGNOSIS — F909 Attention-deficit hyperactivity disorder, unspecified type: Secondary | ICD-10-CM | POA: Insufficient documentation

## 2017-08-28 LAB — COMPREHENSIVE METABOLIC PANEL
ALT: 23 U/L (ref 17–63)
AST: 29 U/L (ref 15–41)
Albumin: 4.2 g/dL (ref 3.5–5.0)
Alkaline Phosphatase: 71 U/L (ref 38–126)
Anion gap: 12 (ref 5–15)
BUN: 13 mg/dL (ref 6–20)
CO2: 17 mmol/L — ABNORMAL LOW (ref 22–32)
Calcium: 9.2 mg/dL (ref 8.9–10.3)
Chloride: 107 mmol/L (ref 101–111)
Creatinine, Ser: 0.93 mg/dL (ref 0.61–1.24)
GFR calc Af Amer: >60 mL/min (ref >60)
GFR calc non Af Amer: >60 mL/min (ref >60)
Glucose, Bld: 116 mg/dL — ABNORMAL HIGH (ref 65–99)
Potassium: 4.1 mmol/L (ref 3.5–5.1)
Sodium: 136 mmol/L (ref 135–145)
Total Bilirubin: 0.6 mg/dL (ref 0.3–1.2)
Total Protein: 7.8 g/dL (ref 6.5–8.1)

## 2017-08-28 LAB — CBC
HCT: 40.8 % (ref 39.0–52.0)
Hemoglobin: 13.5 g/dL (ref 13.0–17.0)
MCH: 26.2 pg (ref 26.0–34.0)
MCHC: 33.1 g/dL (ref 30.0–36.0)
MCV: 79.1 fL (ref 78.0–100.0)
Platelets: 270 10*3/uL (ref 150–400)
RBC: 5.16 MIL/uL (ref 4.22–5.81)
RDW: 14.7 % (ref 11.5–15.5)
WBC: 9.3 10*3/uL (ref 4.0–10.5)

## 2017-08-28 LAB — LIPASE, BLOOD: Lipase: 31 U/L (ref 11–51)

## 2017-08-28 MED ORDER — LOPERAMIDE HCL 2 MG PO TABS: 2.0000 mg | ORAL_TABLET | Freq: Four times a day (QID) | ORAL | 0 refills | Status: DC | PRN

## 2017-08-28 MED ORDER — SODIUM CHLORIDE 0.9 % IV BOLUS (SEPSIS)
2000.0000 mL | Freq: Once | INTRAVENOUS | Status: AC
Start: 2017-08-28 — End: 2017-08-28
  Administered 2017-08-28: 2000 mL via INTRAVENOUS

## 2017-08-28 MED ORDER — METOCLOPRAMIDE HCL 5 MG/ML IJ SOLN
10.0000 mg | Freq: Once | INTRAMUSCULAR | Status: AC
  Administered 2017-08-28: 10 mg via INTRAVENOUS
  Filled 2017-08-28: qty 2

## 2017-08-28 MED ORDER — ONDANSETRON HCL 4 MG/2ML IJ SOLN
4.0000 mg | Freq: Once | INTRAMUSCULAR | Status: AC
  Administered 2017-08-28: 4 mg via INTRAVENOUS
  Filled 2017-08-28: qty 2

## 2017-08-28 MED ORDER — MORPHINE SULFATE (PF) 4 MG/ML IV SOLN
6.0000 mg | Freq: Once | INTRAVENOUS | Status: AC
  Administered 2017-08-28: 6 mg via INTRAVENOUS
  Filled 2017-08-28: qty 2

## 2017-08-28 MED ORDER — ONDANSETRON 4 MG PO TBDP: 4.0000 mg | ORAL_TABLET | Freq: Three times a day (TID) | ORAL | 1 refills | Status: DC | PRN

## 2017-08-28 MED ORDER — IOPAMIDOL (ISOVUE-300) INJECTION 61%
100.0000 mL | Freq: Once | INTRAVENOUS | Status: AC | PRN
  Administered 2017-08-28: 100 mL via INTRAVENOUS

## 2017-08-28 MED ORDER — HYDROCODONE-ACETAMINOPHEN 5-325 MG PO TABS: 1.0000 | ORAL_TABLET | Freq: Four times a day (QID) | ORAL | 0 refills | Status: DC | PRN

## 2017-08-28 MED ORDER — MORPHINE SULFATE (PF) 4 MG/ML IV SOLN
4.0000 mg | Freq: Once | INTRAVENOUS | Status: AC
  Administered 2017-08-28: 4 mg via INTRAVENOUS
  Filled 2017-08-28: qty 1

## 2017-08-28 NOTE — Discharge Instructions (Signed)
CT scan of the abdomen without any acute findings.  Labs without any significant abnormalities.  Take the Zofran for the nausea and vomiting.  Take the Imodium for the diarrhea.  Take the hydrocodone for the pain.  Return for any new or worse symptoms or if not improving over the next 1-2 days.

## 2017-08-28 NOTE — ED Provider Notes (Signed)
CT scan of the abdomen without any acute findings.  Seems to be consistent with gastroenteritis most likely viral.  No significant leukocytosis or lab abnormalities.  CO2 was down some.  Suggestive of a little bit of dehydration and a little bit of a metabolic acidosis.  Patient did receive 2 L of normal saline IV bolus here.  Patient overall doing better.  We will discharged home on Zofran, Imodium, and hydrocodone.   Fredia Sorrow, MD 08/28/17 [phone]

## 2017-08-28 NOTE — ED Triage Notes (Signed)
Patient c/o left upper abd pain with nausea, vomiting, and diarrhea that started last night. Per mother called PCP and was told to come to ER because emesis was black. Hx of ulcerative coleitis. Denies any blood in stools. Patient taking phenergan with no relief-last dose 9am.

## 2017-08-28 NOTE — ED Provider Notes (Signed)
[facility] EMERGENCY DEPARTMENT Provider Note   CSN: [id number] Arrival date & time: 08/28/17  1226     History   Chief Complaint Chief Complaint  Patient presents with  . Abdominal Pain    HPI Wayne Avila is a 27 y.o. male.  HPI Planes of left-sided abdominal pain vomiting and diarrhea onset last night.  Mother reports he is vomited 13 times and has had approximately 4 episodes of diarrhea emesis was black, not bloody or coffee grounds.  Diarrhea was watery.  Mother treated him with Phenergan and with tramadol, without relief.  No fever.  No other associated symptoms. Past Medical History:  Diagnosis Date  . ADHD (attention deficit hyperactivity disorder)   . Anxiety   . Asthma   . Autoimmune hepatitis ([place])   . Bipolar 1 disorder ([place])   . C. difficile colitis 03/22/2016  . Clostridium difficile colitis   . Depression   . GERD (gastroesophageal reflux disease)   . HOH (hard of hearing)   . Mental developmental delay   . Seizures ([place])   . Ulcerative colitis [facility])     Patient Active Problem List   Diagnosis Date Noted  . ADHD 01/09/2017  . Environmental allergies 01/09/2017  . Class 1 obesity due to excess calories with body mass index (BMI) of 34.0 to 34.9 in adult 01/09/2017  . Autoimmune hepatitis ([place]) 01/02/2017  . Asthma in adult, mild intermittent, uncomplicated 13/24/4010  . Anemia 12/31/2016  . Hyperammonemia ([place]) 12/31/2016  . Elevated LFTs 12/31/2016  . Abnormal thyroid function test 03/24/2016  . Ulcerative colitis ([place]) 12/29/2015  . Bipolar 1 disorder, manic, moderate ([place]) 11/09/2014  . Mental developmental delay 11/15/2012    Past Surgical History:  Procedure Laterality Date  . ADENOIDECTOMY    . BIOPSY N/A 12/07/2012   Procedure: GASTRIC BIOPSIES;  Surgeon: [name], MD;  Location: [facility] ORS;  Service: Endoscopy;  Laterality: N/A;  . BIOPSY N/A 08/30/2013   Procedure: BIOPSY;  Surgeon: [name], MD;  Location: [facility] ORS;  Service:  Endoscopy;  Laterality: N/A;  right colon,transverse colon, left colon, rectal biopsies  . BIOPSY  11/20/2015   Procedure: BIOPSY;  Surgeon: [name], MD;  Location: [facility] ENDO SUITE;  Service: Endoscopy;;  ileal, right colon biopsy, left colon, rectum  . BIOPSY  04/18/2016   Procedure: BIOPSY;  Surgeon: [name], MD;  Location: [facility] ENDO SUITE;  Service: Endoscopy;;  left descending colon biopsies  . COLONOSCOPY  11/20/2015   Dr. [name]: Severe erythema, edema, ulcers from the anal verge to 20 cm above the anal verge without mucosal sparing, remaining colon and terminal ileum appeared normal. Biopsies from the rectum revealed fulminant active chronic colitis consistent with IBD. Pathology from terminal ileum revealed intramucosal lymphoid aggregates. Remaining colon random biopsies with inactive chronic colitis  . COLONOSCOPY WITH PROPOFOL N/A 08/30/2013   UVO:[id number] mucosa in the terminal ileum/COLITIS/ MILD PROCTITIS. 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  Marland Kitchen COLONOSCOPY WITH PROPOFOL N/A 11/20/2015   Dr. 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 ESOPHAGOGASTRODUODENOSCOPY  11/20/2015   Dr. [name]: Normal exam, stomach biopsied and duodenal biopsy.. Benign biopsies.  . ESOPHAGOGASTRODUODENOSCOPY (EGD) WITH PROPOFOL N/A 12/07/2012   SLF:The mucosa of the esophagus appeared normal Non-erosive gastritis (inflammation) was found in the gastric antrum; multiple biopsies The duodenal mucosa showed no abnormalities in the bulb and second  portion of the duodenum  . ESOPHAGOGASTRODUODENOSCOPY (EGD) WITH PROPOFOL N/A 11/20/2015   Dr. [name]: normal with normal biopsies, negative H.pylori   . ESOPHAGOGASTRODUODENOSCOPY (EGD) WITH PROPOFOL N/A 04/18/2016   Procedure: ESOPHAGOGASTRODUODENOSCOPY (EGD) WITH PROPOFOL;  Surgeon: [name], MD;  Location: [facility]  ENDO SUITE;  Service: Endoscopy;  Laterality: N/A;  . FLEXIBLE SIGMOIDOSCOPY N/A 04/18/2016   Procedure: FLEXIBLE SIGMOIDOSCOPY;  Surgeon: [name], MD;  Location: [facility] ENDO SUITE;  Service: Endoscopy;  Laterality: N/A;  . TONSILLECTOMY          Home Medications    Prior to Admission medications   Medication Sig Start Date End Date Taking? Authorizing Provider  albuterol (PROVENTIL HFA) 108 (90 Base) MCG/ACT inhaler Inhale into the lungs.    [provider]  albuterol (PROVENTIL) (2.5 MG/3ML) 0.083% nebulizer solution Inhale 3 mLs (2.5 mg total) into the lungs every 6 (six) hours as needed for wheezing or shortness of breath. 04/29/17   Raylene Everts, MD  CANASA 1000 MG suppository 1 suppository daily. 08/20/17   [provider]  cholecalciferol (VITAMIN D) 1000 units tablet Take 1 tablet (1,000 Units total) by mouth daily. START AFTER VITAMIN D 50,000 U TABLETS ARE COMPLETE 04/08/16   Fields, Marga Melnick, MD  cloNIDine HCl (KAPVAY) 0.1 MG TB12 ER tablet Take 0.1 mg by mouth 2 (two) times daily.    [provider]  dicyclomine (BENTYL) 20 MG tablet Take 1 tablet (20 mg total) by mouth 2 (two) times daily. 08/12/17   Rancour, Annie Main, MD  diphenoxylate-atropine (LOMOTIL) 2.5-0.025 MG tablet Take 1 tablet by mouth 4 (four) times daily as needed for diarrhea or loose stools. 07/29/17   Raylene Everts, MD  EPINEPHrine (EPIPEN 2-PAK) 0.3 mg/0.3 mL IJ SOAJ injection Inject 0.3 mg into the muscle once as needed (for severe allergic reaction).    [provider]  famotidine (PEPCID) 20 MG tablet Take 1 tablet (20 mg total) by mouth 2 (two) times daily. Patient taking differently: Take 20 mg by mouth daily.  02/21/17   Milton Ferguson, MD  lactulose [facility]) 10 GM/15ML solution Take by mouth. 01/02/17   [provider]  lidocaine (XYLOCAINE) 2 % solution 10 mL by Mouth route every three (3) hours as needed.    [provider]  loperamide  (IMODIUM) 2 MG capsule Take 1 capsule (2 mg total) by mouth 4 (four) times daily as needed for diarrhea or loose stools. 03/19/17   Horton, Barbette Hair, MD  loratadine (CLARITIN) 10 MG tablet Take 10 mg by mouth. 01/09/17   [provider]  metoCLOPramide (REGLAN) 10 MG tablet Take 1 tablet (10 mg total) by mouth every 6 (six) hours as needed for nausea (nausea/headache). 12/23/16   Daleen Bo, MD  montelukast (SINGULAIR) 10 MG tablet Take 10 mg by mouth. 01/09/17   [provider]  neomycin-polymyxin-hydrocortisone (CORTISPORIN) OTIC solution Place 3 drops into the right ear 4 (four) times daily. 07/29/17   Raylene Everts, MD  ondansetron (ZOFRAN ODT) 8 MG disintegrating tablet 20m ODT q4 hours prn nausea 07/29/17   NRaylene Everts MD  Oxcarbazepine (TRILEPTAL) 300 MG tablet Take one tablet by mouth twice daily/as directed per prescriber 01/06/17   [provider]  paliperidone (INVEGA) 3 MG 24 hr tablet Take 3 mg by mouth daily.    [provider]  propranolol (INDERAL) 10 MG tablet Take 1 tablet by mouth 3 (three) times daily. 08/06/17   [provider]  Respiratory  Therapy Supplies (NEBULIZER COMPRESSOR) KIT And supplies 04/29/17   Raylene Everts, MD  sertraline (ZOLOFT) 50 MG tablet Take 75 mg by mouth daily.    [provider]    Family History Family History  Problem Relation Age of Onset  . Asthma Mother   . Ulcers Mother   . Bipolar disorder Mother   . ADD / ADHD Father   . Diabetes Maternal Grandmother   . Hypertension Maternal Grandmother   . Sleep apnea Maternal Grandmother   . Heart disease Maternal Grandmother   . Diabetes Maternal Grandfather   . Hypertension Maternal Grandfather   . Sleep apnea Maternal Grandfather   . Heart disease Maternal Grandfather   . Cancer Maternal Grandfather        prostate  . Cancer Maternal Aunt        Leukemia  . Colon cancer Neg Hx   . Liver disease Neg Hx     Social  History Social History   Tobacco Use  . Smoking status: Former Smoker    Packs/day: 0.00    Years: 2.00    Pack years: 0.00    Last attempt to quit: 10/06/2012    Years since quitting: 4.8  . Smokeless tobacco: Never Used  . Tobacco comment: Never smoked cigarettes  Substance Use Topics  . Alcohol use: No    Alcohol/week: 0.0 oz    Comment: denies usage  . Drug use: No     Allergies   Nsaids; Pineapple; Remicade [infliximab]; Strawberry extract; Amoxicillin-pot clavulanate; Omeprazole; and Tomato   Review of Systems Review of Systems  Constitutional: Negative.   HENT: Negative.   Respiratory: Negative.   Cardiovascular: Positive for chest pain.       Syncope  Gastrointestinal: Positive for abdominal pain, diarrhea and vomiting.  Musculoskeletal: Negative.   Skin: Negative.   Allergic/Immunologic: Negative.   Neurological: Negative.   Psychiatric/Behavioral: Negative.   All other systems reviewed and are negative.    Physical Exam Updated Vital Signs BP (!) 132/93   Pulse 93   Temp 99.6 F (37.6 C) (Oral)   Resp 15   Ht 5' 8" (1.727 m)   Wt 119.3 kg (263 lb)   SpO2 98%   BMI 39.99 kg/m   Physical Exam  Constitutional: He appears well-developed and well-nourished. No distress.  HENT:  Head: Normocephalic and atraumatic.  Eyes: Pupils are equal, round, and reactive to light. Conjunctivae are normal.  Neck: Neck supple. No tracheal deviation present. No thyromegaly present.  Cardiovascular: Normal rate and regular rhythm.  No murmur heard. Pulmonary/Chest: Effort normal and breath sounds normal.  Abdominal: Soft. Bowel sounds are normal. He exhibits no distension and no mass. There is tenderness. There is no guarding.  Obese tender at left upper quadrant and left lower quadrant  Genitourinary: Penis normal.  Genitourinary Comments: Constance Holster male genitalia  Musculoskeletal: Normal range of motion. He exhibits no edema or tenderness.  Neurological: He is  alert. Coordination normal.  Skin: Skin is warm and dry. No rash noted.  Psychiatric: He has a normal mood and affect.  Nursing note and vitals reviewed.    ED Treatments / Results  Labs (all labs ordered are listed, but only abnormal results are displayed) Labs Reviewed  COMPREHENSIVE METABOLIC PANEL - Abnormal; Notable for the following components:      Result Value   CO2 17 (*)    Glucose, Bld 116 (*)    All other components within normal limits  LIPASE, BLOOD  CBC    EKG None  Radiology Ct Head Wo Contrast  Result Date: 08/27/2017 CLINICAL DATA:  Frontal headache with nausea. EXAM: CT HEAD WITHOUT CONTRAST TECHNIQUE: Contiguous axial images were obtained from the base of the skull through the vertex without intravenous contrast. COMPARISON:  Head CT 10/20/2010.  Brain MRI 04/03/2014 FINDINGS: Brain: No intracranial hemorrhage, mass effect, or midline shift. No hydrocephalus. The basilar cisterns are patent. No evidence of territorial infarct or acute ischemia. No extra-axial or intracranial fluid collection. Vascular: No hyperdense vessel or unexpected calcification. Skull: No fracture or focal lesion. Sinuses/Orbits: Paranasal sinuses and mastoid air cells are clear. The visualized orbits are unremarkable. Other: None. IMPRESSION: No acute intracranial abnormality. Electronically Signed   By: Jeb Levering M.D.   On: 08/27/2017 00:52    Procedures Procedures (including critical care time)  Medications Ordered in ED Medications  sodium chloride 0.9 % bolus 2,000 mL (2,000 mLs Intravenous New Bag/Given 08/28/17 1522)  morphine 4 MG/ML injection 6 mg (6 mg Intravenous Given 08/28/17 1522)  metoCLOPramide (REGLAN) injection 10 mg (10 mg Intravenous Given 08/28/17 1522)   Results for orders placed or performed during the hospital encounter of 08/28/17  Lipase, blood  Result Value Ref Range   Lipase 31 11 - 51 U/L  Comprehensive metabolic panel  Result Value Ref Range    Sodium 136 135 - 145 mmol/L   Potassium 4.1 3.5 - 5.1 mmol/L   Chloride 107 101 - 111 mmol/L   CO2 17 (L) 22 - 32 mmol/L   Glucose, Bld 116 (H) 65 - 99 mg/dL   BUN 13 6 - 20 mg/dL   Creatinine, Ser 0.93 0.61 - 1.24 mg/dL   Calcium 9.2 8.9 - 10.3 mg/dL   Total Protein 7.8 6.5 - 8.1 g/dL   Albumin 4.2 3.5 - 5.0 g/dL   AST 29 15 - 41 U/L   ALT 23 17 - 63 U/L   Alkaline Phosphatase 71 38 - 126 U/L   Total Bilirubin 0.6 0.3 - 1.2 mg/dL   GFR calc non Af Amer >60 >60 mL/min   GFR calc Af Amer >60 >60 mL/min   Anion gap 12 5 - 15  CBC  Result Value Ref Range   WBC 9.3 4.0 - 10.5 K/uL   RBC 5.16 4.22 - 5.81 MIL/uL   Hemoglobin 13.5 13.0 - 17.0 g/dL   HCT 40.8 39.0 - 52.0 %   MCV 79.1 78.0 - 100.0 fL   MCH 26.2 26.0 - 34.0 pg   MCHC 33.1 30.0 - 36.0 g/dL   RDW 14.7 11.5 - 15.5 %   Platelets 270 150 - 400 K/uL   Ct Head Wo Contrast  Result Date: 08/27/2017 CLINICAL DATA:  Frontal headache with nausea. EXAM: CT HEAD WITHOUT CONTRAST TECHNIQUE: Contiguous axial images were obtained from the base of the skull through the vertex without intravenous contrast. COMPARISON:  Head CT 10/20/2010.  Brain MRI 04/03/2014 FINDINGS: Brain: No intracranial hemorrhage, mass effect, or midline shift. No hydrocephalus. The basilar cisterns are patent. No evidence of territorial infarct or acute ischemia. No extra-axial or intracranial fluid collection. Vascular: No hyperdense vessel or unexpected calcification. Skull: No fracture or focal lesion. Sinuses/Orbits: Paranasal sinuses and mastoid air cells are clear. The visualized orbits are unremarkable. Other: None. IMPRESSION: No acute intracranial abnormality. Electronically Signed   By: Jeb Levering M.D.   On: 08/27/2017 00:52   Ct Abdomen Pelvis W Contrast  Result Date: 08/12/2017 CLINICAL DATA:  Initial evaluation for acute  right-sided abdominal pain with diarrhea for 4 days. EXAM: CT ABDOMEN AND PELVIS WITH CONTRAST TECHNIQUE: Multidetector CT imaging  of the abdomen and pelvis was performed using the standard protocol following bolus administration of intravenous contrast. CONTRAST:  184m ISOVUE-300 IOPAMIDOL (ISOVUE-300) INJECTION 61% COMPARISON:  Prior CT from 05/04/2017. FINDINGS: Lower chest: Visualized lung bases are clear. Hepatobiliary: Lobulated contour of the liver again seen, relatively similar to previous. Otherwise, liver demonstrates fairly homogeneous and normal enhancement on today's exam. 11 mm hypodensity within the left hepatic lobe noted (series 2, image 16), indeterminate, but similar to previous. No other focal intrahepatic lesions. Gallbladder within normal limits. No biliary dilatation. Pancreas: Pancreas within normal limits. Spleen: Spleen within normal limits. Adrenals/Urinary Tract: Adrenal glands are normal. Kidneys equal in size with symmetric enhancement. No nephrolithiasis, hydronephrosis or focal enhancing renal mass. No hydroureter. Bladder within normal limits. Stomach/Bowel: Stomach unremarkable. No evidence for bowel obstruction. Appendix within normal limits. No acute inflammatory changes seen about the bowels. Vascular/Lymphatic: Normal intravascular enhancement seen throughout the intra-abdominal aorta. Mesenteric vessels patent proximally. No adenopathy. Reproductive: Prostate normal. Other: No free air or fluid.  No ascites. Musculoskeletal: No acute osseous abnormality. No worrisome lytic or blastic osseous lesions. IMPRESSION: 1. No CT evidence for acute intra-abdominal or pelvic process. 2. Unusual lobulated contour of the liver, similar to previous exam, and of uncertain etiology. Again, further assessment with liver protocol MRI is suggested for complete evaluation if not already performed. Additionally, correlation with LFTs suggested as well. Electronically Signed   By: BJeannine BogaM.D.   On: 08/12/2017 05:34   Dg Abdomen Acute W/chest  Result Date: 08/20/2017 CLINICAL DATA:  27year old male with pain  after colonoscopy. EXAM: DG ABDOMEN ACUTE W/ 1V CHEST COMPARISON:  Abdominal CT dated 08/12/2017 FINDINGS: There is no evidence of dilated bowel loops or free intraperitoneal air. No radiopaque calculi or other significant radiographic abnormality is seen. Heart size and mediastinal contours are within normal limits. Both lungs are clear. IMPRESSION: Negative abdominal radiographs.  No acute cardiopulmonary disease. Electronically Signed   By: AAnner CreteM.D.   On: 08/20/2017 02:33    Initial Impression / Assessment and Plan / ED Course  I have reviewed the triage vital signs and the nursing notes.  Pertinent labs & imaging results that were available during my care of the patient were reviewed by me and considered in my medical decision making (see chart for details).     3:50 PM pain not improved after treatment with intravenous morphine.  Nausea is improved after treatment with intravenous Reglan.  Additional IV morphine ordered Patient signed out to Dr.Zackowski 4:30 PM Final Clinical Impressions(s) / ED Diagnoses  Dx #1 nausea vomiting diarrhea #2 left-sided abdominal pain Final diagnoses:  None    ED Discharge Orders    None       JOrlie Dakin MD 08/28/17 [phone]

## 2017-08-31 ENCOUNTER — Ambulatory Visit (INDEPENDENT_AMBULATORY_CARE_PROVIDER_SITE_OTHER): Payer: Medicaid Other | Admitting: Family Medicine

## 2017-08-31 ENCOUNTER — Telehealth: Payer: Self-pay

## 2017-08-31 ENCOUNTER — Encounter: Payer: Self-pay | Admitting: Family Medicine

## 2017-08-31 ENCOUNTER — Other Ambulatory Visit: Payer: Self-pay

## 2017-08-31 VITALS — BP 112/80 | HR 71 | Temp 98.8°F | Resp 18 | Ht 68.5 in | Wt 260.0 lb

## 2017-08-31 DIAGNOSIS — K51011 Ulcerative (chronic) pancolitis with rectal bleeding: Secondary | ICD-10-CM

## 2017-08-31 DIAGNOSIS — R197 Diarrhea, unspecified: Secondary | ICD-10-CM

## 2017-08-31 MED ORDER — DIPHENOXYLATE-ATROPINE 2.5-0.025 MG PO TABS: 1.0000 | ORAL_TABLET | Freq: Four times a day (QID) | ORAL | 0 refills | Status: DC | PRN

## 2017-08-31 NOTE — Telephone Encounter (Signed)
T/C from Dr. Meda Coffee, pt is being seen there today for chronic abdominal pain and diarrhea. She would like to know what Dr. [name] recommends. Pt seen recently at ED with normal CT and labs. Please advise or call and discuss with Dr. Meda Coffee.

## 2017-08-31 NOTE — Telephone Encounter (Signed)
PLEASE CALL DR. Meda Coffee. SHE NEEDS TO CALL [facility]. PT IS BEING MANAGED BY THEM.

## 2017-08-31 NOTE — Telephone Encounter (Signed)
Dr. Meda Coffee is aware.

## 2017-08-31 NOTE — Patient Instructions (Addendum)
[place] diet Drink plenty of water and fluids Take the stronger medicine for diarrhea I have called Dr [name] and will let you know any additional advice

## 2017-08-31 NOTE — Progress Notes (Signed)
Chief Complaint  Patient presents with  . Fever   Patient was seen in the emergency room on 08/28/2017.  He was diagnosed as having gastroenteritis.  He received IV fluids, Phenergan, pain medication and had laboratory and x-ray testing.  He was sent home with Vicodin for pain, Zofran, and Imodium for diarrhea. He is here today complaining of continued left lower quadrant abdominal pain.  He still having a lot of diarrhea.  He states every time he eats the food goes straight through him.  He is taking Imodium 4 times a day.  This is not helping.  Mother states he had a fever at home 101.2 on Saturday.  No nausea.  No blood in bowels.  No vomiting since ER visit.  Patient Active Problem List   Diagnosis Date Noted  . ADHD 01/09/2017  . Environmental allergies 01/09/2017  . Class 1 obesity due to excess calories with body mass index (BMI) of 34.0 to 34.9 in adult 01/09/2017  . Autoimmune hepatitis ([place]) 01/02/2017  . Asthma in adult, mild intermittent, uncomplicated 12/07/1599  . Anemia 12/31/2016  . Hyperammonemia ([place]) 12/31/2016  . Elevated LFTs 12/31/2016  . Abnormal thyroid function test 03/24/2016  . Ulcerative colitis ([place]) 12/29/2015  . Bipolar 1 disorder, manic, moderate ([place]) 11/09/2014  . Mental developmental delay 11/15/2012    Outpatient Encounter Medications as of 08/31/2017  Medication Sig  . albuterol (PROVENTIL HFA) 108 (90 Base) MCG/ACT inhaler Inhale into the lungs.  Marland Kitchen albuterol (PROVENTIL) (2.5 MG/3ML) 0.083% nebulizer solution Inhale 3 mLs (2.5 mg total) into the lungs every 6 (six) hours as needed for wheezing or shortness of breath.  Marland Kitchen CANASA 1000 MG suppository 1 suppository daily.  . cholecalciferol (VITAMIN D) 1000 units tablet Take 1 tablet (1,000 Units total) by mouth daily. START AFTER VITAMIN D 50,000 U TABLETS ARE COMPLETE  . cloNIDine HCl (KAPVAY) 0.1 MG TB12 ER tablet Take 0.1 mg by mouth 2 (two) times daily.  Marland Kitchen dicyclomine (BENTYL) 20 MG tablet Take 1  tablet (20 mg total) by mouth 2 (two) times daily.  . diphenoxylate-atropine (LOMOTIL) 2.5-0.025 MG tablet Take 1 tablet by mouth 4 (four) times daily as needed for diarrhea or loose stools.  Marland Kitchen EPINEPHrine (EPIPEN 2-PAK) 0.3 mg/0.3 mL IJ SOAJ injection Inject 0.3 mg into the muscle once as needed (for severe allergic reaction).  Marland Kitchen HYDROcodone-acetaminophen (NORCO/VICODIN) 5-325 MG tablet Take 1-2 tablets by mouth every 6 (six) hours as needed.  . loratadine (CLARITIN) 10 MG tablet Take 10 mg by mouth.  . montelukast (SINGULAIR) 10 MG tablet Take 10 mg by mouth.  . ondansetron (ZOFRAN ODT) 4 MG disintegrating tablet Take 1 tablet (4 mg total) by mouth every 8 (eight) hours as needed.  . paliperidone (INVEGA) 3 MG 24 hr tablet Take 3 mg by mouth daily.  . propranolol (INDERAL) 10 MG tablet Take 1 tablet by mouth 3 (three) times daily.  Marland Kitchen Respiratory Therapy Supplies (NEBULIZER COMPRESSOR) KIT And supplies  . sertraline (ZOLOFT) 50 MG tablet Take 75 mg by mouth daily.  . famotidine (PEPCID) 20 MG tablet Take 1 tablet (20 mg total) by mouth 2 (two) times daily. (Patient not taking: Reported on 08/31/2017)  . Oxcarbazepine (TRILEPTAL) 300 MG tablet Take one tablet by mouth twice daily/as directed per prescriber   No facility-administered encounter medications on file as of 08/31/2017.     Allergies  Allergen Reactions  . Nsaids Other (See Comments)    Should not take due to GI condition  .  Pineapple Swelling and Other (See Comments)    Reaction:  Lip swelling  . Remicade [Infliximab] Other (See Comments)    AUTOIMMUNE HEPATITIS  . Strawberry Extract Swelling    Reaction:  Lip swelling  . Amoxicillin-Pot Clavulanate Nausea And Vomiting and Other (See Comments)    GI intolerance   . Omeprazole Nausea And Vomiting  . Tomato Rash    Review of Systems  Constitutional: Positive for fatigue and fever. Negative for chills and unexpected weight change.  HENT: Negative for congestion and dental  problem.   Eyes: Negative for photophobia and visual disturbance.  Respiratory: Negative for cough and shortness of breath.   Cardiovascular: Negative for chest pain and palpitations.  Gastrointestinal: Positive for abdominal pain and diarrhea. Negative for blood in stool, constipation, nausea and vomiting.  Genitourinary: Negative for difficulty urinating and urgency.  Musculoskeletal: Negative for arthralgias and back pain.  Skin: Negative for rash.  Neurological: Negative for light-headedness and headaches.  Psychiatric/Behavioral:       Mother states no behavior problem    BP 112/80   Pulse 71   Temp 98.8 F (37.1 C)   Resp 18   Ht 5' 8.5" (1.74 m)   Wt 260 lb 0.6 oz (118 kg)   SpO2 98%   BMI 38.96 kg/m   Physical Exam  Constitutional: He appears well-developed and well-nourished. No distress.  Obese.  Tired.  HENT:  Head: Normocephalic and atraumatic.  Right Ear: External ear normal.  Left Ear: External ear normal.  Mouth/Throat: Oropharynx is clear and moist.  Eyes: Pupils are equal, round, and reactive to light. Conjunctivae are normal.  Neck: Normal range of motion.  Cardiovascular: Normal rate, regular rhythm and normal heart sounds.  Pulmonary/Chest: Effort normal and breath sounds normal.  Abdominal: Soft. Normal appearance. Bowel sounds are increased. There is tenderness. There is no rebound and no guarding.  Moderate tenderness to deep palpation in the left lower quadrant.  No mass.  No guarding or rebound.  Neurological: He is alert.  Psychiatric: He has a normal mood and affect. His behavior is normal.    ASSESSMENT/PLAN:  1. Ulcerative pancolitis with rectal bleeding ([place])   2. Diarrhea in adult patient    Patient Instructions  Criss [place]ales diet Drink plenty of water and fluids Take the stronger medicine for diarrhea I have called Dr [name] and will let you know any additional advice     Raylene Everts, MD

## 2017-09-08 ENCOUNTER — Ambulatory Visit: Admit: 2017-09-08 | Discharge: 2017-09-09 | Payer: MEDICAID

## 2017-09-08 DIAGNOSIS — K51811 Other ulcerative colitis with rectal bleeding: Principal | ICD-10-CM

## 2017-09-10 ENCOUNTER — Other Ambulatory Visit: Payer: Self-pay

## 2017-09-10 ENCOUNTER — Encounter: Payer: Self-pay | Admitting: Family Medicine

## 2017-09-10 ENCOUNTER — Ambulatory Visit (INDEPENDENT_AMBULATORY_CARE_PROVIDER_SITE_OTHER): Payer: Medicaid Other | Admitting: Family Medicine

## 2017-09-10 VITALS — BP 124/60 | HR 72 | Temp 98.9°F | Ht 68.5 in | Wt 262.0 lb

## 2017-09-10 DIAGNOSIS — Z6834 Body mass index (BMI) 34.0-34.9, adult: Secondary | ICD-10-CM | POA: Diagnosis not present

## 2017-09-10 DIAGNOSIS — Z9109 Other allergy status, other than to drugs and biological substances: Secondary | ICD-10-CM

## 2017-09-10 DIAGNOSIS — J45909 Unspecified asthma, uncomplicated: Secondary | ICD-10-CM

## 2017-09-10 DIAGNOSIS — E6609 Other obesity due to excess calories: Secondary | ICD-10-CM | POA: Diagnosis not present

## 2017-09-10 DIAGNOSIS — K51011 Ulcerative (chronic) pancolitis with rectal bleeding: Secondary | ICD-10-CM | POA: Diagnosis not present

## 2017-09-10 DIAGNOSIS — Z23 Encounter for immunization: Secondary | ICD-10-CM

## 2017-09-10 NOTE — Patient Instructions (Addendum)
Need shots today  Walk every day that you are able Reduce weight by reducing portions, snacks, fried food and sweets Do not drink Sodas or sweet tea, should mostly drink water  Follow up in 3 months with new doctor Healthy Eating Plan What general guidelines do I need to follow?  Check food labels carefully to identify foods with trans fats or high amounts of saturated fat.  Fill one half of your plate with vegetables and green salads. Eat 4-5 servings of vegetables per day. A serving of vegetables equals 1 cup of raw leafy vegetables,  cup of raw or cooked cut-up vegetables, or  cup of vegetable juice.  Fill one fourth of your plate with whole grains. Look for the word "whole" as the first word in the ingredient list.  Fill one fourth of your plate with lean protein foods.  Eat 4-5 servings of fruit per day. A serving of fruit equals one medium whole fruit,  cup of dried fruit,  cup of fresh, frozen, or canned fruit, or  cup of 100% fruit juice.  Eat more foods that contain soluble fiber. Examples of foods that contain this type of fiber are apples, broccoli, carrots, beans, peas, and barley. Aim to get 20-30 g of fiber per day.  Eat more home-cooked food and less restaurant, buffet, and fast food.  Limit or avoid alcohol.  Limit foods that are high in starch and sugar.  Avoid fried foods.  Cook foods by using methods other than frying. Baking, boiling, grilling, and broiling are all great options. Other fat-reducing suggestions include: ? Removing the skin from poultry. ? Removing all visible fats from meats. ? Skimming the fat off of stews, soups, and gravies before serving them. ? Steaming vegetables in water or broth.  Lose weight if you are overweight. Losing just 5-10% of your initial body weight can help your overall health and prevent diseases such as diabetes and heart disease.  Increase your consumption of nuts, legumes, and seeds to 4-5 servings per week. One  serving of dried beans or legumes equals  cup after being cooked, one serving of nuts equals 1 ounces, and one serving of seeds equals  ounce or 1 tablespoon.  You may need to monitor your salt (sodium) intake, especially if you have high blood pressure. Talk with your health care provider or dietitian to get more information about reducing sodium. What foods can I eat? Grains  Breads, including [place], white, pita, wheat, raisin, rye, oatmeal, and [place]. Tortillas that are neither fried nor made with lard or trans fat. Low-fat rolls, including hotdog and hamburger buns and English muffins. Biscuits. Muffins. Waffles. Pancakes. Light popcorn. Whole-grain cereals. Flatbread. Melba toast. Pretzels. Breadsticks. Rusks. Low-fat snacks and crackers, including oyster, saltine, matzo, graham, animal, and rye. Rice and pasta, including brown rice and those that are made with whole wheat. Vegetables All vegetables. Fruits All fruits, but limit coconut. Meats and Other Protein Sources Lean, well-trimmed beef, veal, pork, and lamb. Chicken and [place] without skin. All fish and shellfish. Wild duck, rabbit, pheasant, and venison. Egg whites or low-cholesterol egg substitutes. Dried beans, peas, lentils, and tofu.Seeds and most nuts. Dairy Low-fat or nonfat cheeses, including ricotta, string, and mozzarella. Skim or 1% milk that is liquid, powdered, or evaporated. Buttermilk that is made with low-fat milk. Nonfat or low-fat yogurt. Beverages Mineral water. Diet carbonated beverages. Sweets and Desserts Sherbets and fruit ices. Honey, jam, marmalade, jelly, and syrups. Meringues and gelatins. Pure sugar candy, such as  hard candy, jelly beans, gumdrops, mints, marshmallows, and small amounts of dark chocolate. [employer] Eat all sweets and desserts in moderation. Fats and Oils Nonhydrogenated (trans-free) margarines. Vegetable oils, including soybean, sesame, sunflower, olive, peanut, safflower,  corn, canola, and cottonseed. Salad dressings or mayonnaise that are made with a vegetable oil. Limit added fats and oils that you use for cooking, baking, salads, and as spreads. Other Cocoa powder. Coffee and tea. All seasonings and condiments. The items listed above may not be a complete list of recommended foods or beverages. Contact your dietitian for more options. What foods are not recommended? Grains Breads that are made with saturated or trans fats, oils, or whole milk. Croissants. Butter rolls. Cheese breads. Sweet rolls. Donuts. Buttered popcorn. Chow mein noodles. High-fat crackers, such as cheese or butter crackers. Meats and Other Protein Sources Fatty meats, such as hotdogs, short ribs, sausage, spareribs, bacon, ribeye roast or steak, and mutton. High-fat deli meats, such as salami and bologna. Caviar. Domestic duck and goose. Organ meats, such as kidney, liver, sweetbreads, brains, gizzard, chitterlings, and heart. Dairy Cream, sour cream, cream cheese, and creamed cottage cheese. Whole milk cheeses, including blue (bleu), Monterey Jack, [place], [place], American, [place], Swiss, [place], [place], and [place]. Whole or 2% milk that is liquid, evaporated, or condensed. Whole buttermilk. Cream sauce or high-fat cheese sauce. Yogurt that is made from whole milk. Beverages Regular sodas and drinks with added sugar. Sweets and Desserts Frosting. Pudding. Cookies. Cakes other than angel food cake. Candy that has milk chocolate or white chocolate, hydrogenated fat, butter, coconut, or unknown ingredients. Buttered syrups. Full-fat ice cream or ice cream drinks. Fats and Oils Gravy that has suet, meat fat, or shortening. Cocoa butter, hydrogenated oils, palm oil, coconut oil, palm kernel oil. These can often be found in baked products, candy, fried foods, nondairy creamers, and whipped toppings. Solid fats and shortenings, including bacon fat, salt pork, lard, and butter. Nondairy cream  substitutes, such as coffee creamers and sour cream substitutes. Salad dressings that are made of unknown oils, cheese, or sour cream.

## 2017-09-10 NOTE — Progress Notes (Signed)
Chief Complaint  Patient presents with  . Follow-up   Wayne Avila is here for routine health visit.  This is unusual since he usually comes on when he is sick.  I am pleased that he is feeling well and expressed that to him and his mother. We discussed his health in general.  His depression and mental illness is well controlled.  No behaviors.  He sleeps well. He is not currently having any abdominal pain or diarrhea.  He is compliant with medications.  He is watching his diet and avoiding foods that aggravate him. He is not up-to-date with immunizations.  He has had a pneumococcus but not a Prevnar.  His tetanus was in 2007.  I will have documentation of one hepatitis B shot.  Mother thinks he has had more.  We probably should get a hepatitis B titer and give him boosters if not adequately immune. He does not eat well.  He does not exercise.  His weight is steadily increasing.  BMI today is 39.  I discussed with Johaan and his mom that this is undesirable.  We discussed the basics of low calorie eating, avoiding carbs and sweets, avoiding fried foods, avoiding snacking.  More fruits and vegetables and lean meats.  He also needs to exercise.  He does not do this at all.  He voices that he would like to go to a gym.  [place]  I told him that he needs to walk and get exercise outside.  He states he does not like to exercise outside because of "pollen" and his asthma.  Patient Active Problem List   Diagnosis Date Noted  . Bipolar 1 disorder ([place]) 01/20/2017  . ADHD 01/09/2017  . Environmental allergies 01/09/2017  . Class 1 obesity due to excess calories with body mass index (BMI) of 34.0 to 34.9 in adult 01/09/2017  . Autoimmune hepatitis ([place]) 01/02/2017  . Asthma in adult, mild intermittent, uncomplicated 82/42/3536  . Anemia 12/31/2016  . Hyperammonemia ([place]) 12/31/2016  . Elevated LFTs 12/31/2016  . Abnormal thyroid function test 03/24/2016  .  Ulcerative colitis ([place]) 12/29/2015  . Bipolar 1 disorder, manic, moderate ([place]) 11/09/2014  . Mental developmental delay 11/15/2012    Outpatient Encounter Medications as of 09/10/2017  Medication Sig  . albuterol (PROVENTIL HFA) 108 (90 Base) MCG/ACT inhaler Inhale into the lungs.  Marland Kitchen albuterol (PROVENTIL) (2.5 MG/3ML) 0.083% nebulizer solution Inhale 3 mLs (2.5 mg total) into the lungs every 6 (six) hours as needed for wheezing or shortness of breath.  Marland Kitchen CANASA 1000 MG suppository 1 suppository daily.  . cholecalciferol (VITAMIN D) 1000 units tablet Take 1 tablet (1,000 Units total) by mouth daily. START AFTER VITAMIN D 50,000 U TABLETS ARE COMPLETE  . cloNIDine HCl (KAPVAY) 0.1 MG TB12 ER tablet Take 0.1 mg by mouth 2 (two) times daily.  Marland Kitchen dicyclomine (BENTYL) 20 MG tablet Take 1 tablet (20 mg total) by mouth 2 (two) times daily.  . diphenoxylate-atropine (LOMOTIL) 2.5-0.025 MG tablet Take 1 tablet by mouth 4 (four) times daily as needed for diarrhea or loose stools.  Marland Kitchen EPINEPHrine (EPIPEN 2-PAK) 0.3 mg/0.3 mL IJ SOAJ injection Inject 0.3 mg into the muscle once as needed (for severe allergic reaction).  . famotidine (PEPCID) 20 MG tablet Take 1 tablet (20 mg total) by mouth 2 (two) times daily.  Marland Kitchen loratadine (CLARITIN) 10 MG tablet Take 10 mg by mouth.  . montelukast (SINGULAIR) 10 MG tablet Take  10 mg by mouth.  . ondansetron (ZOFRAN ODT) 4 MG disintegrating tablet Take 1 tablet (4 mg total) by mouth every 8 (eight) hours as needed.  . Oxcarbazepine (TRILEPTAL) 300 MG tablet Take one tablet by mouth twice daily/as directed per prescriber  . paliperidone (INVEGA) 3 MG 24 hr tablet Take 3 mg by mouth daily.  . propranolol (INDERAL) 10 MG tablet Take 1 tablet by mouth 3 (three) times daily.  Marland Kitchen Respiratory Therapy Supplies (NEBULIZER COMPRESSOR) KIT And supplies  . sertraline (ZOLOFT) 50 MG tablet Take 75 mg by mouth daily.  . traZODone (DESYREL) 50 MG tablet Take 50 mg by mouth at bedtime.    No facility-administered encounter medications on file as of 09/10/2017.     Allergies  Allergen Reactions  . Nsaids Other (See Comments)    Should not take due to GI condition  . Pineapple Swelling and Other (See Comments)    Reaction:  Lip swelling  . Remicade [Infliximab] Other (See Comments)    AUTOIMMUNE HEPATITIS  . Strawberry Extract Swelling    Reaction:  Lip swelling  . Amoxicillin-Pot Clavulanate Nausea And Vomiting and Other (See Comments)    GI intolerance   . Omeprazole Nausea And Vomiting  . Tomato Rash    Review of Systems  Constitutional: Positive for unexpected weight change. Negative for chills, fatigue and fever.  HENT: Negative for congestion and dental problem.   Eyes: Negative for photophobia and visual disturbance.  Respiratory: Negative for cough and shortness of breath.   Cardiovascular: Negative for chest pain and palpitations.  Gastrointestinal: Negative for abdominal pain, blood in stool, constipation, diarrhea, nausea and vomiting.  Genitourinary: Negative for difficulty urinating and urgency.  Musculoskeletal: Negative for arthralgias and back pain.  Skin: Negative for rash.  Neurological: Negative for light-headedness and headaches.  Psychiatric/Behavioral:       Mother states no behavior problem      BP 124/60   Pulse 72   Temp 98.9 F (37.2 C) (Oral)   Ht 5' 8.5" (1.74 m)   Wt 262 lb 0.6 oz (118.9 kg)   SpO2 97%   BMI 39.26 kg/m   Physical Exam  Constitutional: He appears well-developed and well-nourished.  Obese.  Smiles,  cooperative  HENT:  Head: Normocephalic and atraumatic.  Right Ear: External ear normal.  Left Ear: External ear normal.  Mouth/Throat: Oropharynx is clear and moist.  Eyes: Pupils are equal, round, and reactive to light. Conjunctivae are normal.  Neck: Normal range of motion. Neck supple. No thyromegaly present.  Cardiovascular: Normal rate, regular rhythm and normal heart sounds.  Pulmonary/Chest:  Effort normal and breath sounds normal. No respiratory distress.  Abdominal: Soft. Bowel sounds are normal. There is no tenderness.  Musculoskeletal: Normal range of motion. He exhibits no edema.  Lymphadenopathy:    He has no cervical adenopathy.  Neurological: He is alert.  Gait normal  Skin: Skin is warm and dry.  Psychiatric:  Slow responses  Nursing note and vitals reviewed.   ASSESSMENT/PLAN:  1. Ulcerative pancolitis with rectal bleeding ([place]) Under the care of gastroenterology at [facility].  Currently asymptomatic.  He is compliant with medications.  He tries to watch his diet.  Currently asymptomatic.  2. Moderate asthma without complication, unspecified whether persistent No wheezing today.  Infrequent use of inhaler.  3. Class 1 obesity due to excess calories without serious comorbidity with body mass index (BMI) of 34.0 to 34.9 in adult Long discussion today about the importance of losing weight.  We discussed dietary changes.  We discussed exercise and activity recommendations.  4. Environmental allergies Controlled with antihistamines/Flonase.   Patient Instructions  Need shots today  Walk every day that you are able Reduce weight by reducing portions, snacks, fried food and sweets Do not drink Sodas or sweet tea, should mostly drink water  Follow up in 3 months with new doctor Healthy Eating Plan What general guidelines do I need to follow?  Check food labels carefully to identify foods with trans fats or high amounts of saturated fat.  Fill one half of your plate with vegetables and green salads. Eat 4-5 servings of vegetables per day. A serving of vegetables equals 1 cup of raw leafy vegetables,  cup of raw or cooked cut-up vegetables, or  cup of vegetable juice.  Fill one fourth of your plate with whole grains. Look for the word "whole" as the first word in the ingredient list.  Fill one fourth of your plate with lean protein foods.  Eat 4-5 servings of  fruit per day. A serving of fruit equals one medium whole fruit,  cup of dried fruit,  cup of fresh, frozen, or canned fruit, or  cup of 100% fruit juice.  Eat more foods that contain soluble fiber. Examples of foods that contain this type of fiber are apples, broccoli, carrots, beans, peas, and barley. Aim to get 20-30 g of fiber per day.  Eat more home-cooked food and less restaurant, buffet, and fast food.  Limit or avoid alcohol.  Limit foods that are high in starch and sugar.  Avoid fried foods.  Cook foods by using methods other than frying. Baking, boiling, grilling, and broiling are all great options. Other fat-reducing suggestions include: ? Removing the skin from poultry. ? Removing all visible fats from meats. ? Skimming the fat off of stews, soups, and gravies before serving them. ? Steaming vegetables in water or broth.  Lose weight if you are overweight. Losing just 5-10% of your initial body weight can help your overall health and prevent diseases such as diabetes and heart disease.  Increase your consumption of nuts, legumes, and seeds to 4-5 servings per week. One serving of dried beans or legumes equals  cup after being cooked, one serving of nuts equals 1 ounces, and one serving of seeds equals  ounce or 1 tablespoon.  You may need to monitor your salt (sodium) intake, especially if you have high blood pressure. Talk with your health care provider or dietitian to get more information about reducing sodium. What foods can I eat? Grains  Breads, including [place], white, pita, wheat, raisin, rye, oatmeal, and [place]. Tortillas that are neither fried nor made with lard or trans fat. Low-fat rolls, including hotdog and hamburger buns and English muffins. Biscuits. Muffins. Waffles. Pancakes. Light popcorn. Whole-grain cereals. Flatbread. Melba toast. Pretzels. Breadsticks. Rusks. Low-fat snacks and crackers, including oyster, saltine, matzo, graham, animal, and rye.  Rice and pasta, including brown rice and those that are made with whole wheat. Vegetables All vegetables. Fruits All fruits, but limit coconut. Meats and Other Protein Sources Lean, well-trimmed beef, veal, pork, and lamb. Chicken and [place] without skin. All fish and shellfish. Wild duck, rabbit, pheasant, and venison. Egg whites or low-cholesterol egg substitutes. Dried beans, peas, lentils, and tofu.Seeds and most nuts. Dairy Low-fat or nonfat cheeses, including ricotta, string, and mozzarella. Skim or 1% milk that is liquid, powdered, or evaporated. Buttermilk that is made with low-fat milk. Nonfat or low-fat yogurt. Beverages Mineral water.  Diet carbonated beverages. Sweets and Desserts Sherbets and fruit ices. Honey, jam, marmalade, jelly, and syrups. Meringues and gelatins. Pure sugar candy, such as hard candy, jelly beans, gumdrops, mints, marshmallows, and small amounts of dark chocolate. [employer] Eat all sweets and desserts in moderation. Fats and Oils Nonhydrogenated (trans-free) margarines. Vegetable oils, including soybean, sesame, sunflower, olive, peanut, safflower, corn, canola, and cottonseed. Salad dressings or mayonnaise that are made with a vegetable oil. Limit added fats and oils that you use for cooking, baking, salads, and as spreads. Other Cocoa powder. Coffee and tea. All seasonings and condiments. The items listed above may not be a complete list of recommended foods or beverages. Contact your dietitian for more options. What foods are not recommended? Grains Breads that are made with saturated or trans fats, oils, or whole milk. Croissants. Butter rolls. Cheese breads. Sweet rolls. Donuts. Buttered popcorn. Chow mein noodles. High-fat crackers, such as cheese or butter crackers. Meats and Other Protein Sources Fatty meats, such as hotdogs, short ribs, sausage, spareribs, bacon, ribeye roast or steak, and mutton. High-fat deli meats, such as salami and  bologna. Caviar. Domestic duck and goose. Organ meats, such as kidney, liver, sweetbreads, brains, gizzard, chitterlings, and heart. Dairy Cream, sour cream, cream cheese, and creamed cottage cheese. Whole milk cheeses, including blue (bleu), Monte[place] Jack, [place], [place], [place], [place], [place], [place], [place], and [place]. Whole or 2% milk that is liquid, evaporated, or condensed. Whole buttermilk. Cream sauce or high-fat cheese sauce. Yogurt that is made from whole milk. Beverages Regular sodas and drinks with added sugar. Sweets and Desserts Frosting. Pudding. Cookies. Cakes other than angel food cake. Candy that has milk chocolate or white chocolate, hydrogenated fat, butter, coconut, or unknown ingredients. Buttered syrups. Full-fat ice cream or ice cream drinks. Fats and Oils Gravy that has suet, meat fat, or shortening. Cocoa butter, hydrogenated oils, palm oil, coconut oil, palm kernel oil. These can often be found in baked products, candy, fried foods, nondairy creamers, and whipped toppings. Solid fats and shortenings, including bacon fat, salt pork, lard, and butter. Nondairy cream substitutes, such as coffee creamers and sour cream substitutes. Salad dressings that are made of unknown oils, cheese, or sour cream.   Raylene Everts, MD

## 2017-10-01 ENCOUNTER — Other Ambulatory Visit: Payer: Self-pay

## 2017-10-01 ENCOUNTER — Emergency Department (HOSPITAL_COMMUNITY)
Admission: EM | Admit: 2017-10-01 | Discharge: 2017-10-01 | Disposition: A | Discharge status: Home / Self Care | Payer: Medicaid Other | Attending: Emergency Medicine | Admitting: Emergency Medicine

## 2017-10-01 ENCOUNTER — Emergency Department (HOSPITAL_COMMUNITY): Payer: Medicaid Other

## 2017-10-01 ENCOUNTER — Encounter (HOSPITAL_COMMUNITY): Payer: Self-pay | Admitting: Emergency Medicine

## 2017-10-01 DIAGNOSIS — Y939 Activity, unspecified: Secondary | ICD-10-CM | POA: Insufficient documentation

## 2017-10-01 DIAGNOSIS — Y999 Unspecified external cause status: Secondary | ICD-10-CM | POA: Diagnosis not present

## 2017-10-01 DIAGNOSIS — Z87891 Personal history of nicotine dependence: Secondary | ICD-10-CM | POA: Insufficient documentation

## 2017-10-01 DIAGNOSIS — T148XXA Other injury of unspecified body region, initial encounter: Secondary | ICD-10-CM | POA: Diagnosis not present

## 2017-10-01 DIAGNOSIS — X503XXA Overexertion from repetitive movements, initial encounter: Secondary | ICD-10-CM | POA: Insufficient documentation

## 2017-10-01 DIAGNOSIS — J45909 Unspecified asthma, uncomplicated: Secondary | ICD-10-CM | POA: Diagnosis not present

## 2017-10-01 DIAGNOSIS — Y929 Unspecified place or not applicable: Secondary | ICD-10-CM | POA: Insufficient documentation

## 2017-10-01 DIAGNOSIS — Z79899 Other long term (current) drug therapy: Secondary | ICD-10-CM | POA: Diagnosis not present

## 2017-10-01 DIAGNOSIS — M79602 Pain in left arm: Secondary | ICD-10-CM | POA: Diagnosis present

## 2017-10-01 MED ORDER — ACETAMINOPHEN 500 MG PO TABS
1000.0000 mg | ORAL_TABLET | Freq: Once | ORAL | Status: AC
  Administered 2017-10-01: 1000 mg via ORAL
  Filled 2017-10-01: qty 2

## 2017-10-01 MED ORDER — CYCLOBENZAPRINE HCL 5 MG PO TABS: 5.0000 mg | ORAL_TABLET | Freq: Two times a day (BID) | ORAL | 0 refills | Status: DC | PRN

## 2017-10-01 NOTE — ED Triage Notes (Signed)
Pt c/o of upper left arm pain since last night, denies injury

## 2017-10-01 NOTE — Discharge Instructions (Signed)
You may apply a heating pad to your arm several times daily which can help your pain resolve quicker - 20 minutes several times daily.  Continue taking your tramadol if needed - you may also take tylenol if needed for pain relief. Your ultrasound is negative with no sign of a blood clot.  I suspect your pain may be from muscle soreness or spasm. You have been prescribed a muscle relaxer for this.

## 2017-10-01 NOTE — ED Provider Notes (Signed)
[facility] EMERGENCY DEPARTMENT Provider Note   CSN: [id number] Arrival date & time: 10/01/17  0240     History   Chief Complaint Chief Complaint  Patient presents with  . Extremity Pain    left arm    HPI Wayne Avila is a 27 y.o. male presenting with left upper arm pain and swelling which woke him from sleep late last night.  He denies injury or overuse of the arm and is right handed.  He denies history of previous similar pain which is described as cramping but mother endorses she has a personal history of dvt and PE and is concerned about this possibility.  He denies cp, sob, pain in the forearm or hand and denies weakness or numbness as well. He took a tramadol tablet and denies improvement in pain but was able to return to sleep.  The history is provided by the patient and a parent.    Past Medical History:  Diagnosis Date  . ADHD (attention deficit hyperactivity disorder)   . Anxiety   . Asthma   . Autoimmune hepatitis ([place])   . Bipolar 1 disorder ([place])   . C. difficile colitis 03/22/2016  . Clostridium difficile colitis   . Depression   . GERD (gastroesophageal reflux disease)   . HOH (hard of hearing)   . Mental developmental delay   . Seizures ([place])   . Ulcerative colitis [facility])     Patient Active Problem List   Diagnosis Date Noted  . Bipolar 1 disorder ([place]) 01/20/2017  . ADHD 01/09/2017  . Environmental allergies 01/09/2017  . Class 1 obesity due to excess calories with body mass index (BMI) of 34.0 to 34.9 in adult 01/09/2017  . Autoimmune hepatitis ([place]) 01/02/2017  . Asthma in adult, mild intermittent, uncomplicated 97/35/3299  . Anemia 12/31/2016  . Hyperammonemia ([place]) 12/31/2016  . Elevated LFTs 12/31/2016  . Abnormal thyroid function test 03/24/2016  . Ulcerative colitis ([place]) 12/29/2015  . Bipolar 1 disorder, manic, moderate ([place]) 11/09/2014  . Mental developmental delay 11/15/2012    Past Surgical History:  Procedure Laterality Date  .  ADENOIDECTOMY    . BIOPSY N/A 12/07/2012   Procedure: GASTRIC BIOPSIES;  Surgeon: [name], MD;  Location: [facility] ORS;  Service: Endoscopy;  Laterality: N/A;  . BIOPSY N/A 08/30/2013   Procedure: BIOPSY;  Surgeon: [name], MD;  Location: [facility] ORS;  Service: Endoscopy;  Laterality: N/A;  right colon,transverse colon, left colon, rectal biopsies  . BIOPSY  11/20/2015   Procedure: BIOPSY;  Surgeon: [name], MD;  Location: [facility] ENDO SUITE;  Service: Endoscopy;;  ileal, right colon biopsy, left colon, rectum  . BIOPSY  04/18/2016   Procedure: BIOPSY;  Surgeon: [name], MD;  Location: [facility] END[facility] SUITE;  Service: Endoscopy;;  left descending colon biopsies  . COLONOSCOPY  11/20/2015   Dr. [name]: Severe erythema, edema, ulcers from the anal verge to 20 cm above the anal verge without mucosal sparing, remaining colon and terminal ileum appeared normal. Biopsies from the rectum revealed fulminant active chronic colitis consistent with IBD. Pathology from terminal ileum revealed intramucosal lymphoid aggregates. Remaining colon random biopsies with inactive chronic colitis  . COLONOSCOPY WITH PROPOFOL N/A 08/30/2013   MEQ:ASTMHD mucosa in the terminal ileum/COLITIS/ MILD PROCTITIS. 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  Marland Kitchen COLONOSCOPY WITH PROPOFOL N/A 11/20/2015   Dr. 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 ESOPHAGOGASTRODUODENOSCOPY  11/20/2015   Dr. [name]: Normal exam, stomach biopsied and duodenal biopsy.. Benign biopsies.  . ESOPHAGOGASTRODUODENOSCOPY (EGD) WITH PROPOFOL N/A 12/07/2012   SLF:The mucosa of the esophagus appeared normal Non-erosive gastritis (inflammation) was found in the gastric antrum; multiple biopsies The duodenal mucosa showed no abnormalities in the bulb and second portion of the duodenum  . ESOPHAGOGASTRODUODENOSCOPY  (EGD) WITH PROPOFOL N/A 11/20/2015   Dr. [name]: normal with normal biopsies, negative H.pylori   . ESOPHAGOGASTRODUODENOSCOPY (EGD) WITH PROPOFOL N/A 04/18/2016   Procedure: ESOPHAGOGASTRODUODENOSCOPY (EGD) WITH PROPOFOL;  Surgeon: [name], MD;  Location: [facility];  Service: Endoscopy;  Laterality: N/A;  . FLEXIBLE SIGMOIDOSCOPY N/A 04/18/2016   Procedure: FLEXIBLE SIGMOIDOSCOPY;  Surgeon: [name], MD;  Location: [facility];  Service: Endoscopy;  Laterality: N/A;  . TONSILLECTOMY          Home Medications    Prior to Admission medications   Medication Sig Start Date End Date Taking? Authorizing Provider  albuterol (PROVENTIL HFA) 108 (90 Base) MCG/ACT inhaler Inhale into the lungs.   Yes [provider]  albuterol (PROVENTIL) (2.5 MG/3ML) 0.083% nebulizer solution Inhale 3 mLs (2.5 mg total) into the lungs every 6 (six) hours as needed for wheezing or shortness of breath. 04/29/17  Yes Raylene Everts, MD  cholecalciferol (VITAMIN D) 1000 units tablet Take 1 tablet (1,000 Units total) by mouth daily. START AFTER VITAMIN D 50,000 U TABLETS ARE COMPLETE 04/08/16  Yes Fields, [name], MD  cloNIDine HCl (KAPVAY) 0.1 MG TB12 ER tablet Take 0.1 mg by mouth 2 (two) times daily.   Yes [provider]  diphenoxylate-atropine (LOMOTIL) 2.5-0.025 MG tablet Take 1 tablet by mouth 4 (four) times daily as needed for diarrhea or loose stools. 08/31/17  Yes Raylene Everts, MD  loratadine (CLARITIN) 10 MG tablet Take 10 mg by mouth. 01/09/17  Yes [provider]  montelukast (SINGULAIR) 10 MG tablet Take 10 mg by mouth. 01/09/17  Yes [provider]  paliperidone (INVEGA) 3 MG 24 hr tablet Take 3 mg by mouth daily.   Yes [provider]  propranolol (INDERAL) 10 MG tablet Take 1 tablet by mouth 3 (three) times daily. 08/06/17  Yes [provider]  sertraline (ZOLOFT) 50 MG tablet Take 75 mg by mouth daily.   Yes [provider]  traZODone (DESYREL) 50 MG tablet Take 50 mg by mouth at bedtime.   Yes [provider]  CANASA 1000 MG suppository 1 suppository daily. 08/20/17   [provider]  cyclobenzaprine (FLEXERIL) 5 MG tablet Take 1 tablet (5 mg total) by mouth 2 (two) times daily as needed for muscle spasms. 10/01/17   Evalee Jefferson, PA-C  dicyclomine (BENTYL) 20 MG tablet Take 1 tablet (20 mg total) by mouth 2 (two) times daily. 08/12/17   Rancour, Annie Main, MD  EPINEPHrine (EPIPEN 2-PAK) 0.3 mg/0.3 mL IJ SOAJ injection Inject 0.3 mg into the muscle once as needed (for severe allergic reaction).    [provider]  famotidine (PEPCID) 20 MG tablet Take 1 tablet (20 mg total) by mouth 2 (two) times daily. 02/21/17   Milton Ferguson, MD  ondansetron (ZOFRAN ODT) 4 MG disintegrating tablet Take 1 tablet (4 mg total) by mouth every 8 (eight) hours as needed. 08/28/17   Fredia Sorrow, MD  Oxcarbazepine (TRILEPTAL) 300 MG tablet Take one tablet by mouth twice daily/as directed per prescriber 01/06/17   [provider]  promethazine (PHENERGAN) 12.5 MG tablet TAKE 1 TABLET BY MOUTH EEVRY 6 HOURS AS NEEDED 08/27/17   [provider]  Respiratory Therapy Supplies (NEBULIZER COMPRESSOR) KIT And supplies 04/29/17   Raylene Everts, MD    Family History Family History  Problem Relation Age of Onset  . Asthma Mother   . Ulcers Mother   . Bipolar disorder Mother   . ADD / ADHD Father   . Diabetes Maternal Grandmother   . Hypertension Maternal Grandmother   . Sleep apnea Maternal Grandmother   . Heart disease Maternal Grandmother   . Diabetes Maternal Grandfather   . Hypertension Maternal Grandfather   . Sleep apnea Maternal Grandfather   . Heart disease Maternal Grandfather   . Cancer Maternal Grandfather        prostate  . Cancer Maternal Aunt        Leukemia  . Colon cancer Neg Hx   . Liver disease Neg Hx     Social History Social History   Tobacco Use  .  Smoking status: Former Smoker    Packs/day: 0.00    Years: 2.00    Pack years: 0.00    Last attempt to quit: 10/06/2012    Years since quitting: 4.9  . Smokeless tobacco: Never Used  . Tobacco comment: Never smoked cigarettes  Substance Use Topics  . Alcohol use: No    Alcohol/week: 0.0 oz    Comment: denies usage  . Drug use: No     Allergies   Nsaids; Pineapple; Remicade [infliximab]; Strawberry extract; Amoxicillin-pot clavulanate; Omeprazole; and Tomato   Review of Systems Review of Systems  Constitutional: Negative for chills and fever.  Cardiovascular: Negative for chest pain and palpitations.  Gastrointestinal: Negative for nausea and vomiting.  Musculoskeletal: Positive for arthralgias. Negative for joint swelling and myalgias.  Skin: Negative for rash and wound.  Neurological: Negative for weakness and numbness.     Physical Exam Updated Vital Signs Ht 5' 8"  (1.727 m)   Wt 118.8 kg (262 lb)   BMI 39.84 kg/m   Physical Exam  Constitutional: He appears well-developed and well-nourished.  HENT:  Head: Atraumatic.  Neck: Normal range of motion.  Cardiovascular:  Pulses equal bilaterally  Musculoskeletal: He exhibits tenderness. He exhibits no edema.  ttp left mid upper arm which does not seem to localize over the musculature. Perhaps subtle edema compared to right upper arm.  Forearms and hands symmetric. Compartments soft.  Forearm nontender with good fingertip cap refill and full radial pulse.   Neurological: He is alert. He has normal strength. He displays normal reflexes. No sensory deficit.  Skin: Skin is warm and dry.  Psychiatric: He has a normal mood and affect.     ED Treatments / Results  Labs (all labs ordered are listed, but only abnormal results are displayed) Labs Reviewed - No data to display  EKG None  Radiology US Venous Img Upper Uni Left  Result Date: 10/01/2017 CLINICAL DATA:  Left upper extremity pain and swelling for 1 day  EXAM: LEFT UPPER EXTREMITY VENOUS DUPLEX ULTRASOUND TECHNIQUE: Doppler venous assessment of the left upper extremity deep venous system was performed, including characterization of spectral flow, compressibility, and phasicity. COMPARISON:  None. FINDINGS: Left internal jugular, subclavian, axillary, brachial, radial, and ulnar veins are compressible. There is no thrombus in these deep venous structures. Doppler analysis demonstrates a phasic venous waveform. No obvious superficial vein thrombosis. IMPRESSION: No evidence of left upper extremity DVT. Electronically Signed   By: Arnell Sieving  Hoss M.D.   On: 10/01/2017 10:50    Procedures Procedures (including critical care time)  Medications Ordered in ED Medications  acetaminophen (TYLENOL) tablet 1,000 mg (1,000 mg Oral Given 10/01/17 1008)     Initial Impression / Assessment and Plan / ED Course  I have reviewed the triage vital signs and the nursing notes.  Pertinent labs & imaging results that were available during my care of the patient were reviewed by me and considered in my medical decision making (see chart for details).     Heat tx discussed, flexeril. Prn f/u for new or persistent sx.  Exam reassuring, with fam hx of dvt, US obtained, no dvt.  Pt denies cp, sob, sx most c/w muscoskeletal source.  Final Clinical Impressions(s) / ED Diagnoses   Final diagnoses:  Muscle strain    ED Discharge Orders        Ordered    cyclobenzaprine (FLEXERIL) 5 MG tablet  2 times daily PRN     10/01/17 1133       Evalee Jefferson, PA-C 10/01/17 1135    Julianne Rice, MD 10/01/17 [phone]

## 2017-10-01 NOTE — ED Notes (Signed)
Pt taken to US

## 2017-10-19 ENCOUNTER — Emergency Department (HOSPITAL_COMMUNITY): Payer: Medicaid Other

## 2017-10-19 ENCOUNTER — Emergency Department (HOSPITAL_COMMUNITY)
Admission: EM | Admit: 2017-10-19 | Discharge: 2017-10-19 | Disposition: A | Discharge status: Home / Self Care | Payer: Medicaid Other | Attending: Emergency Medicine | Admitting: Emergency Medicine

## 2017-10-19 ENCOUNTER — Encounter (HOSPITAL_COMMUNITY): Payer: Self-pay | Admitting: Emergency Medicine

## 2017-10-19 DIAGNOSIS — J45909 Unspecified asthma, uncomplicated: Secondary | ICD-10-CM | POA: Insufficient documentation

## 2017-10-19 DIAGNOSIS — R079 Chest pain, unspecified: Secondary | ICD-10-CM | POA: Diagnosis present

## 2017-10-19 DIAGNOSIS — R0981 Nasal congestion: Secondary | ICD-10-CM | POA: Diagnosis not present

## 2017-10-19 DIAGNOSIS — Z87891 Personal history of nicotine dependence: Secondary | ICD-10-CM | POA: Diagnosis not present

## 2017-10-19 DIAGNOSIS — R0789 Other chest pain: Secondary | ICD-10-CM

## 2017-10-19 DIAGNOSIS — Z79899 Other long term (current) drug therapy: Secondary | ICD-10-CM | POA: Diagnosis not present

## 2017-10-19 LAB — CBC
HCT: 36.1 % — ABNORMAL LOW (ref 39.0–52.0)
Hemoglobin: 12 g/dL — ABNORMAL LOW (ref 13.0–17.0)
MCH: 26.8 pg (ref 26.0–34.0)
MCHC: 33.2 g/dL (ref 30.0–36.0)
MCV: 80.8 fL (ref 78.0–100.0)
Platelets: 238 10*3/uL (ref 150–400)
RBC: 4.47 MIL/uL (ref 4.22–5.81)
RDW: 14.3 % (ref 11.5–15.5)
WBC: 7.6 10*3/uL (ref 4.0–10.5)

## 2017-10-19 LAB — TROPONIN I: Troponin I: <0.03 ng/mL (ref <0.03)

## 2017-10-19 LAB — BASIC METABOLIC PANEL
Anion gap: 8 (ref 5–15)
BUN: 12 mg/dL (ref 6–20)
CO2: 22 mmol/L (ref 22–32)
Calcium: 9.3 mg/dL (ref 8.9–10.3)
Chloride: 108 mmol/L (ref 101–111)
Creatinine, Ser: 0.86 mg/dL (ref 0.61–1.24)
GFR calc Af Amer: >60 mL/min (ref >60)
GFR calc non Af Amer: >60 mL/min (ref >60)
Glucose, Bld: 98 mg/dL (ref 65–99)
Potassium: 4 mmol/L (ref 3.5–5.1)
Sodium: 138 mmol/L (ref 135–145)

## 2017-10-19 NOTE — Discharge Instructions (Addendum)
Hold an ice pack over painful area 4 times daily for 30 minutes at a time.  Please contact your new primary care physician to schedule an appointment

## 2017-10-19 NOTE — ED Provider Notes (Signed)
[facility] EMERGENCY DEPARTMENT Provider Note   CSN: [id number] Arrival date & time: 10/19/17  1329  Level 5 caveat History is obtained from patient and from patient's mother and legal guardian who accompanies him patient has history of mental developmental delay  History   Chief Complaint Chief Complaint  Patient presents with  . Chest Pain    HPI Wayne Avila is a 27 y.o. male.  Lanes of anterior nonradiating chest pain onset yesterday .  Has pain exacerbated by sneezing or changing positions improved by remaining still.  No shortness of breath.  Other associated symptoms include nasal congestion.  He denies fever.  No other associated symptoms no treatment prior to coming here  HPI  Past Medical History:  Diagnosis Date  . ADHD (attention deficit hyperactivity disorder)   . Anxiety   . Asthma   . Autoimmune hepatitis ([place])   . Bipolar 1 disorder ([place])   . C. difficile colitis 03/22/2016  . Clostridium difficile colitis   . Depression   . GERD (gastroesophageal reflux disease)   . HOH (hard of hearing)   . Mental developmental delay   . Seizures ([place])   . Ulcerative colitis [facility])     Patient Active Problem List   Diagnosis Date Noted  . Bipolar 1 disorder ([place]) 01/20/2017  . ADHD 01/09/2017  . Environmental allergies 01/09/2017  . Class 1 obesity due to excess calories with body mass index (BMI) of 34.0 to 34.9 in adult 01/09/2017  . Autoimmune hepatitis ([place]) 01/02/2017  . Asthma in adult, mild intermittent, uncomplicated 08/28/2246  . Anemia 12/31/2016  . Hyperammonemia ([place]) 12/31/2016  . Elevated LFTs 12/31/2016  . Abnormal thyroid function test 03/24/2016  . Ulcerative colitis ([place]) 12/29/2015  . Bipolar 1 disorder, manic, moderate ([place]) 11/09/2014  . Mental developmental delay 11/15/2012    Past Surgical History:  Procedure Laterality Date  . ADENOIDECTOMY    . BIOPSY N/A 12/07/2012   Procedure: GASTRIC BIOPSIES;  Surgeon: [name], MD;   Location: [facility] ORS;  Service: Endoscopy;  Laterality: N/A;  . BIOPSY N/A 08/30/2013   Procedure: BIOPSY;  Surgeon: [name], MD;  Location: [facility] ORS;  Service: Endoscopy;  Laterality: N/A;  right colon,transverse colon, left colon, rectal biopsies  . BIOPSY  11/20/2015   Procedure: BIOPSY;  Surgeon: [name], MD;  Location: [facility] ENDO SUITE;  Service: Endoscopy;;  ileal, right colon biopsy, left colon, rectum  . BIOPSY  04/18/2016   Procedure: BIOPSY;  Surgeon: [name], MD;  Location: [facility] END[facility] SUITE;  Service: Endoscopy;;  left descending colon biopsies  . COLONOSCOPY  11/20/2015   Dr. [name]: Severe erythema, edema, ulcers from the anal verge to 20 cm above the anal verge without mucosal sparing, remaining colon and terminal ileum appeared normal. Biopsies from the rectum revealed fulminant active chronic colitis consistent with IBD. Pathology from terminal ileum revealed intramucosal lymphoid aggregates. Remaining colon random biopsies with inactive chronic colitis  . COLONOSCOPY WITH PROPOFOL N/A 08/30/2013   GNO:IBBCWU mucosa in the terminal ileum/COLITIS/ MILD PROCTITIS. 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  Marland Kitchen COLONOSCOPY WITH PROPOFOL N/A 11/20/2015   Dr. 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 ESOPHAGOGASTRODUODENOSCOPY  11/20/2015   Dr. [name]: Normal exam, stomach biopsied and duodenal biopsy.. Benign biopsies.  . ESOPHAGOGASTRODUODENOSCOPY (EGD) WITH PROPOFOL N/A 12/07/2012   SLF:The mucosa of the esophagus appeared  normal Non-erosive gastritis (inflammation) was found in the gastric antrum; multiple biopsies The duodenal mucosa showed no abnormalities in the bulb and second portion of the duodenum  . ESOPHAGOGASTRODUODENOSCOPY (EGD) WITH PROPOFOL N/A 11/20/2015   Dr. [name]: normal with normal biopsies, negative H.pylori   .  ESOPHAGOGASTRODUODENOSCOPY (EGD) WITH PROPOFOL N/A 04/18/2016   Procedure: ESOPHAGOGASTRODUODENOSCOPY (EGD) WITH PROPOFOL;  Surgeon: [name], MD;  Location[facility];  Service: Endoscopy;  Laterality: N/A;  . FLEXIBLE SIGMOIDOSCOPY N/A 04/18/2016   Procedure: FLEXIBLE SIGMOIDOSCOPY;  Surgeon: [name], MD;  Location: [facility];  Service: Endoscopy;  Laterality: N/A;  . TONSILLECTOMY          Home Medications    Prior to Admission medications   Medication Sig Start Date End Date Taking? Authorizing Provider  albuterol (PROVENTIL HFA) 108 (90 Base) MCG/ACT inhaler Inhale into the lungs.    [provider]  albuterol (PROVENTIL) (2.5 MG/3ML) 0.083% nebulizer solution Inhale 3 mLs (2.5 mg total) into the lungs every 6 (six) hours as needed for wheezing or shortness of breath. 04/29/17   Raylene Everts, MD  CA[place]SA 1000 MG suppository 1 suppository daily. 08/20/17   [provider]  cholecalciferol (VITAMIN D) 1000 units tablet Take 1 tablet (1,000 Units total) by mouth daily. START AFTER VITAMIN D 50,000 U TABLETS ARE COMPLETE 04/08/16   Fields, Marga Melnick, MD  cloNIDine HCl (KAPVAY) 0.1 MG TB12 ER tablet Take 0.1 mg by mouth 2 (two) times daily.    [provider]  cyclobenzaprine (FLEXERIL) 5 MG tablet Take 1 tablet (5 mg total) by mouth 2 (two) times daily as needed for muscle spasms. 10/01/17   Evalee Jefferson, PA-C  dicyclomine (BENTYL) 20 MG tablet Take 1 tablet (20 mg total) by mouth 2 (two) times daily. 08/12/17   Rancour, Annie Main, MD  diphenoxylate-atropine (LOMOTIL) 2.5-0.025 MG tablet Take 1 tablet by mouth 4 (four) times daily as needed for diarrhea or loose stools. 08/31/17   Raylene Everts, MD  EPINEPHrine (EPIPEN 2-PAK) 0.3 mg/0.3 mL IJ SOAJ injection Inject 0.3 mg into the muscle once as needed (for severe allergic reaction).    [provider]  famotidine (PEPCID) 20 MG tablet Take 1 tablet (20 mg total) by mouth 2 (two) times  daily. 02/21/17   Milton Ferguson, MD  loratadine (CLARITIN) 10 MG tablet Take 10 mg by mouth. 01/09/17   [provider]  montelukast (SINGULAIR) 10 MG tablet Take 10 mg by mouth. 01/09/17   [provider]  ondansetron (ZOFRAN ODT) 4 MG disintegrating tablet Take 1 tablet (4 mg total) by mouth every 8 (eight) hours as needed. 08/28/17   Fredia Sorrow, MD  Oxcarbazepine (TRILEPTAL) 300 MG tablet Take one tablet by mouth twice daily/as directed per prescriber 01/06/17   [provider]  paliperidone (INVEGA) 3 MG 24 hr tablet Take 3 mg by mouth daily.    [provider]  promethazine (PHENERGAN) 12.5 MG tablet TAKE 1 TABLET BY MOUTH EEVRY 6 HOURS AS NEEDED 08/27/17   [provider]  propranolol (INDERAL) 10 MG tablet Take 1 tablet by mouth 3 (three) times daily. 08/06/17   [provider]  Respiratory Therapy Supplies (NEBULIZER COMPRESSOR) KIT And supplies 04/29/17   Raylene Everts, MD  sertraline (ZOLOFT) 50 MG tablet Take 75 mg by mouth daily.    [provider]  traZODone (DESYREL) 50 MG tablet Take 50 mg by mouth at bedtime.    [provider]  Family History Family History  Problem Relation Age of Onset  . Asthma Mother   . Ulcers Mother   . Bipolar disorder Mother   . ADD / ADHD Father   . Diabetes Maternal Grandmother   . Hypertension Maternal Grandmother   . Sleep apnea Maternal Grandmother   . Heart disease Maternal Grandmother   . Diabetes Maternal Grandfather   . Hypertension Maternal Grandfather   . Sleep apnea Maternal Grandfather   . Heart disease Maternal Grandfather   . Cancer Maternal Grandfather        prostate  . Cancer Maternal Aunt        Leukemia  . Colon cancer Neg Hx   . Liver disease Neg Hx     Social History Social History   Tobacco Use  . Smoking status: Former Smoker    Packs/day: 0.00    Years: 2.00    Pack years: 0.00    Last attempt to quit: 10/06/2012    Years since  quitting: 5.0  . Smokeless tobacco: Never Used  . Tobacco comment: Never smoked cigarettes  Substance Use Topics  . Alcohol use: No    Alcohol/week: 0.0 oz    Comment: denies usage  . Drug use: No     Allergies   Nsaids; Pineapple; Remicade [infliximab]; Strawberry extract; Amoxicillin-pot clavulanate; Omeprazole; and Tomato   Review of Systems Review of Systems  Unable to perform ROS: Other  HENT: Positive for congestion and sneezing.   Cardiovascular: Positive for chest pain.  Unable to obtain complete review of systems.  Mental developmental delay   Physical Exam Updated Vital Signs BP 118/74 (BP Location: Right Arm)   Pulse 86   Temp 98.4 F (36.9 C) (Oral)   Resp 18   Wt 120.2 kg (265 lb)   SpO2 98%   BMI 40.29 kg/m    Physical Exam  Constitutional: He appears well-developed and well-nourished.  HENT:  Head: Normocephalic and atraumatic.  Right Ear: External ear normal.  Left Ear: External ear normal.  Mouth/Throat: Oropharynx is clear and moist.  Nasal congestion  Eyes: Pupils are equal, round, and reactive to light. Conjunctivae are normal.  Neck: Neck supple. No tracheal deviation present. No thyromegaly present.  Cardiovascular: Normal rate and regular rhythm.  No murmur heard. Pulmonary/Chest: Effort normal and breath sounds normal. He exhibits tenderness.  Chest is tender anteriorly, reproducing pain exactly  Abdominal: Soft. Bowel sounds are normal. He exhibits no distension. There is no tenderness.  Obese  Musculoskeletal: Normal range of motion. He exhibits no edema or tenderness.  Neurological: He is alert. Coordination normal.  Skin: Skin is warm and dry. No rash noted.  Psychiatric: He has a normal mood and affect.  Nursing note and vitals reviewed.    ED Treatments / Results  Labs (all labs ordered are listed, but only abnormal results are displayed) Labs Reviewed  CBC - Abnormal; Notable for the following components:      Result Value    Hemoglobin 12.0 (*)    HCT 36.1 (*)    All other components within normal limits  BASIC METABOLIC PANEL  TROPONIN I    EKG EKG Interpretation  Date/Time:  Monday Oct 19 2017 13:39:57 EDT Ventricular Rate:  71 PR Interval:  170 QRS Duration: 86 QT Interval:  370 QTC Calculation: 402 R Axis:   18 Text Interpretation:  Normal sinus rhythm Nonspecific T wave abnormality Abnormal ECG No significant change since last tracing Confirmed by Orlie Dakin [phone]) on 10/19/2017  3:35:18 PM   Radiology Dg Chest 2 View  Result Date: 10/19/2017 CLINICAL DATA:  Chest pain beginning yesterday with shortness of breath EXAM: CHEST - 2 VIEW COMPARISON:  08/20/2017 FINDINGS: The heart size and mediastinal contours are within normal limits. Both lungs are clear. The visualized skeletal structures are unremarkable. IMPRESSION: No active cardiopulmonary disease. Electronically Signed   By: Kathreen Devoid   On: 10/19/2017 14:19    Procedures Procedures (including critical care time)  Medications Ordered in ED Medications - No data to display  Chest x-ray viewed by me Results for orders placed or performed during the hospital encounter of 82/80/03  Basic metabolic panel  Result Value Ref Range   Sodium 138 135 - 145 mmol/L   Potassium 4.0 3.5 - 5.1 mmol/L   Chloride 108 101 - 111 mmol/L   CO2 22 22 - 32 mmol/L   Glucose, Bld 98 65 - 99 mg/dL   BUN 12 6 - 20 mg/dL   Creatinine, Ser 0.86 0.61 - 1.24 mg/dL   Calcium 9.3 8.9 - 10.3 mg/dL   GFR calc non Af Amer >60 >60 mL/min   GFR calc Af Amer >60 >60 mL/min   Anion gap 8 5 - 15  CBC  Result Value Ref Range   WBC 7.6 4.0 - 10.5 K/uL   RBC 4.47 4.22 - 5.81 MIL/uL   Hemoglobin 12.0 (L) 13.0 - 17.0 g/dL   HCT 36.1 (L) 39.0 - 52.0 %   MCV 80.8 78.0 - 100.0 fL   MCH 26.8 26.0 - 34.0 pg   MCHC 33.2 30.0 - 36.0 g/dL   RDW 14.3 11.5 - 15.5 %   Platelets 238 150 - 400 K/uL  Troponin I  Result Value Ref Range   Troponin I <0.03 <0.03 ng/mL    Dg Chest 2 View  Result Date: 10/19/2017 CLINICAL DATA:  Chest pain beginning yesterday with shortness of breath EXAM: CHEST - 2 VIEW COMPARISON:  08/20/2017 FINDINGS: The heart size and mediastinal contours are within normal limits. Both lungs are clear. The visualized skeletal structures are unremarkable. IMPRESSION: No active cardiopulmonary disease. Electronically Signed   By: Kathreen Devoid   On: 10/19/2017 14:19   US Venous Img Upper Uni Left  Result Date: 10/01/2017 CLINICAL DATA:  Left upper extremity pain and swelling for 1 day EXAM: LEFT UPPER EXTREMITY VENOUS DUPLEX ULTRASOUND TECHNIQUE: Doppler venous assessment of the left upper extremity deep venous system was performed, including characterization of spectral flow, compressibility, and phasicity. COMPARISON:  None. FINDINGS: Left internal jugular, subclavian, axillary, brachial, radial, and ulnar veins are compressible. There is no thrombus in these deep venous structures. Doppler analysis demonstrates a phasic venous waveform. No obvious superficial vein thrombosis. IMPRESSION: No evidence of left upper extremity DVT. Electronically Signed   By: [name] M.D.   On: 10/01/2017 10:50   Initial Impression / Assessment and Plan / ED Course  I have reviewed the triage vital signs and the nursing notes.  Pertinent labs & imaging results that were available during my care of the patient were reviewed by me and considered in my medical decision making (see chart for details).     Lab work normal with exception of mild anemia hemoglobin 12.0.  Hemoglobin 13.5.  Hemoglobin was 13.5 one-month ago however was 12.0 two months ago.  Exam and history consistent with chest wall pain.  Suggest cold compresses.  Follow-up with PMD.  Patient has a new PMD which have encouraged his mother to call  today or tomorrow to schedule an appointment Final Clinical Impressions(s) / ED Diagnoses   Final diagnoses:  Chest wall pain  Diagnoses #2 nasal  congestion  ED Discharge Orders    None      Orlie Dakin, MD 10/19/17 1554

## 2017-10-19 NOTE — ED Triage Notes (Signed)
Patient complaining of chest pain since yesterday, worse with movement and "sneezing."

## 2017-10-20 ENCOUNTER — Encounter (HOSPITAL_COMMUNITY): Payer: Self-pay | Admitting: *Deleted

## 2017-10-20 ENCOUNTER — Other Ambulatory Visit: Payer: Self-pay

## 2017-10-20 ENCOUNTER — Emergency Department (HOSPITAL_COMMUNITY)
Admission: EM | Admit: 2017-10-20 | Discharge: 2017-10-20 | Disposition: A | Discharge status: Home / Self Care | Payer: Medicaid Other | Attending: Emergency Medicine | Admitting: Emergency Medicine

## 2017-10-20 DIAGNOSIS — J029 Acute pharyngitis, unspecified: Secondary | ICD-10-CM | POA: Diagnosis not present

## 2017-10-20 DIAGNOSIS — R0981 Nasal congestion: Secondary | ICD-10-CM | POA: Diagnosis not present

## 2017-10-20 DIAGNOSIS — Z79899 Other long term (current) drug therapy: Secondary | ICD-10-CM | POA: Insufficient documentation

## 2017-10-20 DIAGNOSIS — R05 Cough: Secondary | ICD-10-CM | POA: Insufficient documentation

## 2017-10-20 DIAGNOSIS — R059 Cough, unspecified: Secondary | ICD-10-CM

## 2017-10-20 LAB — GROUP A STREP BY PCR: Group A Strep by PCR: NOT DETECTED

## 2017-10-20 NOTE — ED Provider Notes (Signed)
[facility] EMERGENCY DEPARTMENT Provider Note   CSN: [id number] Arrival date & time: 10/20/17  1524     History   Chief Complaint Chief Complaint  Patient presents with  . Sore Throat    HPI Wayne Avila is a 27 y.o. male.  HPI   Wayne Avila is a 27 y.o. male, with a history of bipolar, GERD, developmental delay, UC, and seizures, presenting to the ED with sore throat and nasal congestion for the last 2 days.  Accompanied by his mother at the bedside.  Pain is bilateral, sharp, moderate to severe, nonradiating.  Has not taken any medications for his complaints.  Also endorses mild nonproductive cough and subjective fever. Denies difficulty breathing or swallowing, N/V/D, chest pain, abdominal pain, ear pain, neck pain/stiffness, or any other complaints.      Past Medical History:  Diagnosis Date  . ADHD (attention deficit hyperactivity disorder)   . Anxiety   . Asthma   . Autoimmune hepatitis ([place])   . Bipolar 1 disorder ([place])   . C. difficile colitis 03/22/2016  . Clostridium difficile colitis   . Depression   . GERD (gastroesophageal reflux disease)   . HOH (hard of hearing)   . Mental developmental delay   . Seizures ([place])   . Ulcerative colitis [facility])     Patient Active Problem List   Diagnosis Date Noted  . Bipolar 1 disorder ([place]) 01/20/2017  . ADHD 01/09/2017  . Environmental allergies 01/09/2017  . Class 1 obesity due to excess calories with body mass index (BMI) of 34.0 to 34.9 in adult 01/09/2017  . Autoimmune hepatitis ([place]) 01/02/2017  . Asthma in adult, mild intermittent, uncomplicated 97/07/6376  . Anemia 12/31/2016  . Hyperammonemia ([place]) 12/31/2016  . Elevated LFTs 12/31/2016  . Abnormal thyroid function test 03/24/2016  . Ulcerative colitis ([place]) 12/29/2015  . Bipolar 1 disorder, manic, moderate ([place]) 11/09/2014  . Mental developmental delay 11/15/2012    Past Surgical History:  Procedure Laterality Date  . ADENOIDECTOMY    . BIOPSY  N/A 12/07/2012   Procedure: GASTRIC BIOPSIES;  Surgeon: [name], MD;  Location: [facility];  Service: Endoscopy;  Laterality: N/A;  . BIOPSY N/A 08/30/2013   Procedure: BIOPSY;  Surgeon: [name], MD;  Location: [facility];  Service: Endoscopy;  Laterality: N/A;  right colon,transverse colon, left colon, rectal biopsies  . BIOPSY  11/20/2015   Procedure: BIOPSY;  Surgeon: [name], MD;  Location: [facility];  Service: Endoscopy;;  ileal, right colon biopsy, left colon, rectum  . BIOPSY  04/18/2016   Procedure: BIOPSY;  Surgeon: [name], MD;  Location: [facility];  Service: Endoscopy;;  left descending colon biopsies  . COLONOSCOPY  11/20/2015   Dr. [name]: Severe erythema, edema, ulcers from the anal verge to 20 cm above the anal verge without mucosal sparing, remaining colon and terminal ileum appeared normal. Biopsies from the rectum revealed fulminant active chronic colitis consistent with IBD. Pathology from terminal ileum revealed intramucosal lymphoid aggregates. Remaining colon random biopsies with inactive chronic colitis  . COLONOSCOPY WITH PROPOFOL N/A 08/30/2013   HYI:FOYDXA mucosa in the terminal ileum/COLITIS/ MILD PROCTITIS. 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  Marland Kitchen COLONOSCOPY WITH PROPOFOL N/A 11/20/2015   Dr. 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 ESOPHAGOGASTRODUODENOSCOPY  11/20/2015   Dr. [name]: Normal exam, stomach biopsied and  duodenal biopsy.. Benign biopsies.  . ESOPHAGOGASTRODUODENOSCOPY (EGD) WITH PROPOFOL N/A 12/07/2012   SLF:The mucosa of the esophagus appeared normal Non-erosive gastritis (inflammation) was found in the gastric antrum; multiple biopsies The duodenal mucosa showed no abnormalities in the bulb and second portion of the duodenum  . ESOPHAGOGASTRODUODENOSCOPY (EGD) WITH PROPOFOL N/A  11/20/2015   Dr. [name]: normal with normal biopsies, negative H.pylori   . ESOPHAGOGASTRODUODENOSCOPY (EGD) WITH PROPOFOL N/A 04/18/2016   Procedure: ESOPHAGOGASTRODUODENOSCOPY (EGD) WITH PROPOFOL;  Surgeon: [name], MD;  Location[facility];  Service: Endoscopy;  Laterality: N/A;  . FLEXIBLE SIGMOIDOSCOPY N/A 04/18/2016   Procedure: FLEXIBLE SIGMOIDOSCOPY;  Surgeon: [name], MD;  Location: [facility];  Service: Endoscopy;  Laterality: N/A;  . TONSILLECTOMY          Home Medications    Prior to Admission medications   Medication Sig Start Date End Date Taking? Authorizing Provider  albuterol (PROVENTIL HFA) 108 (90 Base) MCG/ACT inhaler Inhale into the lungs.    [provider]  albuterol (PROVENTIL) (2.5 MG/3ML) 0.083% nebulizer solution Inhale 3 mLs (2.5 mg total) into the lungs every 6 (six) hours as needed for wheezing or shortness of breath. 04/29/17   Raylene Everts, MD  CA[place] 1000 MG suppository 1 suppository daily. 08/20/17   [provider]  cholecalciferol (VITAMIN D) 1000 units tablet Take 1 tablet (1,000 Units total) by mouth daily. START AFTER VITAMIN D 50,000 U TABLETS ARE COMPLETE 04/08/16   Fields, Marga Melnick, MD  cloNIDine HCl (KAPVAY) 0.1 MG TB12 ER tablet Take 0.1 mg by mouth 2 (two) times daily.    [provider]  cyclobenzaprine (FLEXERIL) 5 MG tablet Take 1 tablet (5 mg total) by mouth 2 (two) times daily as needed for muscle spasms. 10/01/17   Evalee Jefferson, PA-C  dicyclomine (BENTYL) 20 MG tablet Take 1 tablet (20 mg total) by mouth 2 (two) times daily. 08/12/17   Rancour, Annie Main, MD  diphenoxylate-atropine (LOMOTIL) 2.5-0.025 MG tablet Take 1 tablet by mouth 4 (four) times daily as needed for diarrhea or loose stools. 08/31/17   Raylene Everts, MD  EPINEPHrine (EPIPEN 2-PAK) 0.3 mg/0.3 mL IJ SOAJ injection Inject 0.3 mg into the muscle once as needed (for severe allergic reaction).    [provider]    famotidine (PEPCID) 20 MG tablet Take 1 tablet (20 mg total) by mouth 2 (two) times daily. 02/21/17   Milton Ferguson, MD  loratadine (CLARITIN) 10 MG tablet Take 10 mg by mouth. 01/09/17   [provider]  montelukast (SINGULAIR) 10 MG tablet Take 10 mg by mouth. 01/09/17   [provider]  ondansetron (ZOFRAN ODT) 4 MG disintegrating tablet Take 1 tablet (4 mg total) by mouth every 8 (eight) hours as needed. 08/28/17   Fredia Sorrow, MD  Oxcarbazepine (TRILEPTAL) 300 MG tablet Take one tablet by mouth twice daily/as directed per prescriber 01/06/17   [provider]  paliperidone (INVEGA) 3 MG 24 hr tablet Take 3 mg by mouth daily.    [provider]  promethazine (PHENERGAN) 12.5 MG tablet TAKE 1 TABLET BY MOUTH EEVRY 6 HOURS AS NEEDED 08/27/17   [provider]  propranolol (INDERAL) 10 MG tablet Take 1 tablet by mouth 3 (three) times daily. 08/06/17   [provider]  Respiratory Therapy Supplies (NEBULIZER COMPRESSOR) KIT And supplies 04/29/17   Raylene Everts, MD  sertraline (ZOLOFT) 50 MG tablet Take 75 mg by mouth daily.    [provider]  traZODone (DESYREL) 50 MG tablet Take 50 mg by mouth at bedtime.    [provider]    Family History Family History  Problem Relation Age of Onset  . Asthma Mother   . Ulcers Mother   . Bipolar disorder Mother   . ADD / ADHD Father   . Diabetes Maternal Grandmother   . Hypertension Maternal Grandmother   . Sleep apnea Maternal Grandmother   . Heart disease Maternal Grandmother   . Diabetes Maternal Grandfather   . Hypertension Maternal Grandfather   . Sleep apnea Maternal Grandfather   . Heart disease Maternal Grandfather   . Cancer Maternal Grandfather        prostate  . Cancer Maternal Aunt        Leukemia  . Colon cancer Neg Hx   . Liver disease Neg Hx     Social History Social History   Tobacco Use  . Smoking status: Former Smoker    Packs/day: 0.00     Years: 2.00    Pack years: 0.00    Last attempt to quit: 10/06/2012    Years since quitting: 5.0  . Smokeless tobacco: Never Used  . Tobacco comment: Never smoked cigarettes  Substance Use Topics  . Alcohol use: No    Alcohol/week: 0.0 oz    Comment: denies usage  . Drug use: No     Allergies   Nsaids; Pineapple; Remicade [infliximab]; Strawberry extract; Amoxicillin-pot clavulanate; Omeprazole; and Tomato   Review of Systems Review of Systems  Constitutional: Positive for fever (subjective).  HENT: Positive for congestion, rhinorrhea and sore throat. Negative for drooling, trouble swallowing and voice change.   Respiratory: Positive for cough. Negative for shortness of breath.   Cardiovascular: Negative for chest pain.  Gastrointestinal: Negative for abdominal pain, diarrhea, nausea and vomiting.  All other systems reviewed and are negative.    Physical Exam Updated Vital Signs BP 122/76 (BP Location: Right Arm)   Pulse 76   Temp 98 F (36.7 C) (Oral)   Resp 12   Ht _0  (1.727 m)   Wt 120.2 kg (265 lb)   SpO2 98%   BMI 40.29 kg/m   Physical Exam  Constitutional: He appears well-developed and well-nourished. No distress.  HENT:  Head: Normocephalic and atraumatic.  Mouth/Throat: Posterior oropharyngeal erythema present. No oropharyngeal exudate or posterior oropharyngeal edema.  No trismus.  Mouth opening to at least 3 finger widths.  Handles oral secretions without difficulty.  No noted facial swelling.  No swelling or tenderness to the submental or submandibular regions.  No swelling or tenderness into the soft tissues of the neck.  Eyes: Conjunctivae are normal.  Neck: Neck supple.  Cardiovascular: Normal rate, regular rhythm, normal heart sounds and intact distal pulses.  Pulmonary/Chest: Effort normal and breath sounds normal. No respiratory distress.  Abdominal: Soft. There is no tenderness. There is no guarding.  Musculoskeletal: He exhibits no edema.    Lymphadenopathy:    He has no cervical adenopathy.  Neurological: He is alert.  Skin: Skin is warm and dry. He is not diaphoretic.  Psychiatric: He has a normal mood and affect. His behavior is normal.  Nursing note and vitals reviewed.    ED Treatments / Results  Labs (all labs ordered are listed, but only abnormal results are displayed) Labs Reviewed  GROUP A STREP BY PCR    EKG None  Radiology Dg Chest 2 View  Result Date: 10/19/2017 CLINICAL DATA:  Chest pain beginning  yesterday with shortness of breath EXAM: CHEST - 2 VIEW COMPARISON:  08/20/2017 FINDINGS: The heart size and mediastinal contours are within normal limits. Both lungs are clear. The visualized skeletal structures are unremarkable. IMPRESSION: No active cardiopulmonary disease. Electronically Signed   By: Kathreen Devoid   On: 10/19/2017 14:19    Procedures Procedures (including critical care time)  Medications Ordered in ED Medications - No data to display   Initial Impression / Assessment and Plan / ED Course  I have reviewed the triage vital signs and the nursing notes.  Pertinent labs & imaging results that were available during my care of the patient were reviewed by me and considered in my medical decision making (see chart for details).     Patient presents with sore throat, congestion, and minor cough.  Postnasal drip is a possible explanation.  Strep screen negative. Patient and his mother were given instructions for home care as well as return precautions.  Both parties voice understanding of these instructions, accept the plan, and are comfortable with discharge.  Final Clinical Impressions(s) / ED Diagnoses   Final diagnoses:  Sore throat  Cough  Nasal congestion    ED Discharge Orders    None       Layla Maw 10/20/17 1810    Nat Christen, MD 10/21/17 [phone]

## 2017-10-20 NOTE — ED Triage Notes (Signed)
Pt c/o sore throat x 3 days; pt was seen here yesterday for same complaint and chest pain; pt was diagnosed with chest wall pain but pt states the sore throat has gotten worse

## 2017-10-20 NOTE — Discharge Instructions (Addendum)
Strep test was negative.  Your symptoms are consistent with a viral illness. Viruses do not require antibiotics. Treatment is symptomatic care and it is important to note that these symptoms may last for 7-14 days.   Hand washing: Wash your hands throughout the day, but especially before and after touching the face, using the restroom, sneezing, coughing, or touching surfaces that have been coughed or sneezed upon. Hydration: Symptoms will be intensified and complicated by dehydration. Dehydration can also extend the duration of symptoms. Drink plenty of fluids and get plenty of rest. You should be drinking at least half a liter of water an hour to stay hydrated. Electrolyte drinks (ex. Gatorade, Powerade, Pedialyte) are also encouraged. You should be drinking enough fluids to make your urine light yellow, almost clear. If this is not the case, you are not drinking enough water. Please note that some of the treatments indicated below will not be effective if you are not adequately hydrated. Diet: Please concentrate on hydration, however, you may introduce food slowly.  Start with a clear liquid diet, progressed to a full liquid diet, and then bland solids as you are able. Pain or fever: Ibuprofen, Naproxen, or Tylenol for pain or fever.  Zyrtec or Claritin: May add these medication daily to control underlying symptoms of congestion, sneezing, and other signs of allergies. Flonase: Use this medication, as directed, for nasal and sinus congestion. Congestion: Plain Mucinex may help relieve congestion. Saline sinus rinses and saline nasal sprays may also help relieve congestion. If you do not have heart problems or an allergy to such medications, you may also try phenylephrine or Sudafed. Sore throat: Warm liquids or Chloraseptic spray may help soothe a sore throat. Gargle twice a day with a salt water solution made from a half teaspoon of salt in a cup of warm water.  Follow up: Follow up with a primary  care provider, as needed, for any future management of this issue.

## 2017-10-22 ENCOUNTER — Encounter (HOSPITAL_COMMUNITY): Payer: Self-pay | Admitting: *Deleted

## 2017-10-22 ENCOUNTER — Emergency Department (HOSPITAL_COMMUNITY)
Admission: EM | Admit: 2017-10-22 | Discharge: 2017-10-22 | Disposition: A | Discharge status: Home / Self Care | Payer: Medicaid Other | Attending: Emergency Medicine | Admitting: Emergency Medicine

## 2017-10-22 ENCOUNTER — Other Ambulatory Visit: Payer: Self-pay

## 2017-10-22 DIAGNOSIS — R202 Paresthesia of skin: Secondary | ICD-10-CM | POA: Diagnosis not present

## 2017-10-22 DIAGNOSIS — Z79899 Other long term (current) drug therapy: Secondary | ICD-10-CM | POA: Diagnosis not present

## 2017-10-22 DIAGNOSIS — J45909 Unspecified asthma, uncomplicated: Secondary | ICD-10-CM | POA: Insufficient documentation

## 2017-10-22 DIAGNOSIS — J069 Acute upper respiratory infection, unspecified: Secondary | ICD-10-CM | POA: Insufficient documentation

## 2017-10-22 DIAGNOSIS — Z87891 Personal history of nicotine dependence: Secondary | ICD-10-CM | POA: Insufficient documentation

## 2017-10-22 DIAGNOSIS — J029 Acute pharyngitis, unspecified: Secondary | ICD-10-CM | POA: Diagnosis present

## 2017-10-22 MED ORDER — PHENOL 1.4 % MT LIQD: 1.0000 | OROMUCOSAL | 0 refills | Status: DC | PRN

## 2017-10-22 MED ORDER — SALINE SPRAY 0.65 % NA SOLN: 1.0000 | NASAL | 0 refills | Status: DC | PRN

## 2017-10-22 NOTE — ED Triage Notes (Signed)
Pt c/o sore throat, cough, runny nose, fever, bilateral hands going numb, was seen in er on 10/20/2017 for sore throat,

## 2017-10-22 NOTE — ED Provider Notes (Signed)
[facility] Provider Note   CSN: [id number] Arrival date & time: 10/22/17  0025     History   Chief Complaint Chief Complaint  Patient presents with  . URI    HPI Wayne Avila is a 27 y.o. male past medical history of ADHD, anxiety, asthma, bipolar 1 disorder, ulcerative colitis, mental mental delay presenting with sore throat, cough, fever which have been ongoing for the last 2 days.  Patient was seen 2 days ago in the emergency department and had a negative strep.  Mother is at bedside reporting that he was seen the day on the 13th for chest wall pain and had a negative chest x-ray as well.  She states that he has been complaining of bilateral hand numbness and has showed her streaks of blood in his sputum.  Otherwise symptoms have been the same over the last couple days.  He has taken ibuprofen with some relief.  HPI  Past Medical History:  Diagnosis Date  . ADHD (attention deficit hyperactivity disorder)   . Anxiety   . Asthma   . Autoimmune hepatitis ([place])   . Bipolar 1 disorder ([place])   . C. difficile colitis 03/22/2016  . Clostridium difficile colitis   . Depression   . GERD (gastroesophageal reflux disease)   . HOH (hard of hearing)   . Mental developmental delay   . Seizures ([place])   . Ulcerative colitis [facility])     Patient Active Problem List   Diagnosis Date Noted  . Bipolar 1 disorder ([place]) 01/20/2017  . ADHD 01/09/2017  . Environmental allergies 01/09/2017  . Class 1 obesity due to excess calories with body mass index (BMI) of 34.0 to 34.9 in adult 01/09/2017  . Autoimmune hepatitis ([place]) 01/02/2017  . Asthma in adult, mild intermittent, uncomplicated 25/85/2778  . Anemia 12/31/2016  . Hyperammonemia ([place]) 12/31/2016  . Elevated LFTs 12/31/2016  . Abnormal thyroid function test 03/24/2016  . Ulcerative colitis ([place]) 12/29/2015  . Bipolar 1 disorder, manic, moderate ([place]) 11/09/2014  . Mental developmental delay 11/15/2012    Past  Surgical History:  Procedure Laterality Date  . ADENOIDECTOMY    . BIOPSY N/A 12/07/2012   Procedure: GASTRIC BIOPSIES;  Surgeon: [name], MD;  Location: [facility];  Service: Endoscopy;  Laterality: N/A;  . BIOPSY N/A 08/30/2013   Procedure: BIOPSY;  Surgeon: [name], MD;  Location: [facility];  Service: Endoscopy;  Laterality: N/A;  right colon,transverse colon, left colon, rectal biopsies  . BIOPSY  11/20/2015   Procedure: BIOPSY;  Surgeon: [name], MD;  Location: [facility];  Service: Endoscopy;;  ileal, right colon biopsy, left colon, rectum  . BIOPSY  04/18/2016   Procedure: BIOPSY;  Surgeon: [name], MD;  Location: [facility];  Service: Endoscopy;;  left descending colon biopsies  . COLONOSCOPY  11/20/2015   Dr. [name]: Severe erythema, edema, ulcers from the anal verge to 20 cm above the anal verge without mucosal sparing, remaining colon and terminal ileum appeared normal. Biopsies from the rectum revealed fulminant active chronic colitis consistent with IBD. Pathology from terminal ileum revealed intramucosal lymphoid aggregates. Remaining colon random biopsies with inactive chronic colitis  . COLONOSCOPY WITH PROPOFOL N/A 08/30/2013   EUM:PNTIRW mucosa in the terminal ileum/COLITIS/ MILD PROCTITIS. 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  Marland Kitchen COLONOSCOPY WITH PROPOFOL N/A 11/20/2015   Dr. [name]: chronic inactive  pancolitis and active severe ulcerative proctitis   . ESOPHAGOGASTRODUODENOSCOPY  11/20/2015   Dr. [name]: Normal exam, stomach biopsied and duodenal biopsy.. Benign biopsies.  . ESOPHAGOGASTRODUODENOSCOPY (EGD) WITH PROPOFOL N/A 12/07/2012   SLF:The mucosa of the esophagus appeared normal Non-erosive gastritis (inflammation) was found in the gastric antrum; multiple biopsies The duodenal mucosa showed no abnormalities in the bulb and second portion  of the duodenum  . ESOPHAGOGASTRODUODENOSCOPY (EGD) WITH PROPOFOL N/A 11/20/2015   Dr. [name]: normal with normal biopsies, negative H.pylori   . ESOPHAGOGASTRODUODENOSCOPY (EGD) WITH PROPOFOL N/A 04/18/2016   Procedure: ESOPHAGOGASTRODUODENOSCOPY (EGD) WITH PROPOFOL;  Surgeon: [name], MD;  [facility];  Service: Endoscopy;  Laterality: N/A;  . FLEXIBLE SIGMOIDOSCOPY N/A 04/18/2016   Procedure: FLEXIBLE SIGMOIDOSCOPY;  Surgeon: [name], MD;  Location: [facility];  Service: Endoscopy;  Laterality: N/A;  . TONSILLECTOMY          Home Medications    Prior to Admission medications   Medication Sig Start Date End Date Taking? Authorizing Provider  albuterol (PROVENTIL HFA) 108 (90 Base) MCG/ACT inhaler Inhale into the lungs.    [provider]  albuterol (PROVENTIL) (2.5 MG/3ML) 0.083% nebulizer solution Inhale 3 mLs (2.5 mg total) into the lungs every 6 (six) hours as needed for wheezing or shortness of breath. 04/29/17   Raylene Everts, MD  CA[place] 1000 MG suppository 1 suppository daily. 08/20/17   [provider]  cholecalciferol (VITAMIN D) 1000 units tablet Take 1 tablet (1,000 Units total) by mouth daily. START AFTER VITAMIN D 50,000 U TABLETS ARE COMPLETE 04/08/16   Fields, Marga Melnick, MD  cloNIDine HCl (KAPVAY) 0.1 MG TB12 ER tablet Take 0.1 mg by mouth 2 (two) times daily.    [provider]  cyclobenzaprine (FLEXERIL) 5 MG tablet Take 1 tablet (5 mg total) by mouth 2 (two) times daily as needed for muscle spasms. 10/01/17   Evalee Jefferson, PA-C  dicyclomine (BENTYL) 20 MG tablet Take 1 tablet (20 mg total) by mouth 2 (two) times daily. 08/12/17   Rancour, Annie Main, MD  diphenoxylate-atropine (LOMOTIL) 2.5-0.025 MG tablet Take 1 tablet by mouth 4 (four) times daily as needed for diarrhea or loose stools. 08/31/17   Raylene Everts, MD  EPINEPHrine (EPIPEN 2-PAK) 0.3 mg/0.3 mL IJ SOAJ injection Inject 0.3 mg into the muscle once as needed  (for severe allergic reaction).    [provider]  famotidine (PEPCID) 20 MG tablet Take 1 tablet (20 mg total) by mouth 2 (two) times daily. 02/21/17   Milton Ferguson, MD  loratadine (CLARITIN) 10 MG tablet Take 10 mg by mouth. 01/09/17   [provider]  montelukast (SINGULAIR) 10 MG tablet Take 10 mg by mouth. 01/09/17   [provider]  ondansetron (ZOFRAN ODT) 4 MG disintegrating tablet Take 1 tablet (4 mg total) by mouth every 8 (eight) hours as needed. 08/28/17   Fredia Sorrow, MD  Oxcarbazepine (TRILEPTAL) 300 MG tablet Take one tablet by mouth twice daily/as directed per prescriber 01/06/17   [provider]  paliperidone (INVEGA) 3 MG 24 hr tablet Take 3 mg by mouth daily.    [provider]  phenol (CHLORASEPTIC) 1.4 % LIQD Use as directed 1 spray in the mouth or throat as needed for throat irritation / pain. 10/22/17   Emeline General, PA-C  promethazine (PHENERGAN) 12.5 MG tablet TAKE 1 TABLET BY MOUTH EEVRY 6 HOURS AS NEEDED 08/27/17   [provider]  propranolol (INDERAL)  10 MG tablet Take 1 tablet by mouth 3 (three) times daily. 08/06/17   [provider]  Respiratory Therapy Supplies (NEBULIZER COMPRESSOR) KIT And supplies 04/29/17   Raylene Everts, MD  sertraline (ZOLOFT) 50 MG tablet Take 75 mg by mouth daily.    [provider]  sodium chloride (OCEAN) 0.65 % SOLN nasal spray Place 1 spray into both nostrils as needed for congestion. 10/22/17   Emeline General, PA-C  traZODone (DESYREL) 50 MG tablet Take 50 mg by mouth at bedtime.    [provider]    Family History Family History  Problem Relation Age of Onset  . Asthma Mother   . Ulcers Mother   . Bipolar disorder Mother   . ADD / ADHD Father   . Diabetes Maternal Grandmother   . Hypertension Maternal Grandmother   . Sleep apnea Maternal Grandmother   . Heart disease Maternal Grandmother   . Diabetes Maternal Grandfather   .  Hypertension Maternal Grandfather   . Sleep apnea Maternal Grandfather   . Heart disease Maternal Grandfather   . Cancer Maternal Grandfather        prostate  . Cancer Maternal Aunt        Leukemia  . Colon cancer Neg Hx   . Liver disease Neg Hx     Social History Social History   Tobacco Use  . Smoking status: Former Smoker    Packs/day: 0.00    Years: 2.00    Pack years: 0.00    Last attempt to quit: 10/06/2012    Years since quitting: 5.0  . Smokeless tobacco: Never Used  . Tobacco comment: Never smoked cigarettes  Substance Use Topics  . Alcohol use: No    Alcohol/week: 0.0 oz    Comment: denies usage  . Drug use: No     Allergies   Nsaids; Pineapple; Remicade [infliximab]; Strawberry extract; Amoxicillin-pot clavulanate; Omeprazole; and Tomato   Review of Systems Review of Systems  Constitutional: Positive for chills, fatigue and fever.  HENT: Positive for congestion and sore throat. Negative for trouble swallowing and voice change.   Respiratory: Positive for cough. Negative for choking, chest tightness, shortness of breath, wheezing and stridor.   Cardiovascular: Negative for chest pain.  Gastrointestinal: Negative for abdominal pain, nausea and vomiting.  Genitourinary: Negative for difficulty urinating.  Musculoskeletal: Negative for myalgias, neck pain and neck stiffness.  Skin: Negative for color change, pallor and rash.  Neurological: Positive for numbness. Negative for dizziness, weakness, light-headedness and headaches.     Physical Exam Updated Vital Signs BP 138/84   Pulse 76   Temp 99 F (37.2 C) (Oral)   Resp 18   Ht _0  (1.727 m)   Wt 118.8 kg (262 lb)   SpO2 100%   BMI 39.84 kg/m   Physical Exam  Constitutional: He appears well-developed and well-nourished. No distress.  Afebrile, nontoxic-appearing, lying comfortably in bed no acute distress.  HENT:  Head: Normocephalic and atraumatic.  Mouth/Throat: Oropharynx is clear and  moist. No oropharyngeal exudate.  S/p tonsillectomy. No gross oral abscess. Uvula is midline, arches are simmetrical and intact. No swelling or exudate. No trismus. Sublingual mucosa is soft and non-tender. Tolerating oral secretions. No concern for ludwig's angina.  Eyes: Conjunctivae and EOM are normal. Right eye exhibits no discharge. Left eye exhibits no discharge.  Neck: Normal range of motion. Neck supple.  Cardiovascular: Normal rate, regular rhythm, normal heart sounds and intact distal pulses.  No murmur heard.  Pulmonary/Chest: Effort normal and breath sounds normal. No stridor. No respiratory distress. He has no wheezes. He has no rales.  Lungs are clear to auscultation in all fields bilaterally.  Abdominal: He exhibits no distension.  Musculoskeletal: Normal range of motion. He exhibits no edema.  Lymphadenopathy:    He has cervical adenopathy.  Neurological: He is alert. No sensory deficit. He exhibits normal muscle tone.  Patient discriminated perfectly between sharp/dull sensation to upper extremities bilaterally. 5/5 strength in upper extremities bilaterally (Shoulder abduction/abduction, elbow flexion extension, grips).  Skin: Skin is warm and dry. No rash noted. He is not diaphoretic. No erythema. No pallor.  Psychiatric: He has a normal mood and affect.  Nursing note and vitals reviewed.    ED Treatments / Results  Labs (all labs ordered are listed, but only abnormal results are displayed) Labs Reviewed - No data to display  EKG None  Radiology No results found.  Procedures Procedures (including critical care time)  Medications Ordered in ED Medications - No data to display   Initial Impression / Assessment and Plan / ED Course  I have reviewed the triage vital signs and the nursing notes.  Pertinent labs & imaging results that were available during my care of the patient were reviewed by me and considered in my medical decision making (see chart for  details).    Patient presenting with upper respiratory infection symptoms and complaining of streaks of blood in the sputum as well as bilateral hand paresthesia.  Inconsistent exam, initially patient stated that he could not feel any light touch. Tested using sharp and dull probe and he discriminated perfectly in hands and upper extremities bilaterally. 5/5 strength in upper extremities bilaterally.  Initial concern with differential diagnosis which could have included Guillain-barre but this is unlikely given exam findings inconsistent with patient's reported symptoms.  Patient is neurovascularly intact bilaterally.  Patient had a recent chest x-ray 3 days ago which was normal and a negative strep 2 days ago.  Oropharynx is clear, lungs are clear to auscultation.  Patient's symptoms are consistent with viral upper respiratory infection. Will discharge home with symptomatic relief and close follow-up with PCP.  Discussed strict return precautions and advised guardian present in the room to return if worsening or new concerning symptoms.  She understood the instructions and agreed with plan.  Final Clinical Impressions(s) / ED Diagnoses   Final diagnoses:  Viral upper respiratory tract infection    ED Discharge Orders        Ordered    sodium chloride (OCEAN) 0.65 % SOLN nasal spray  As needed     10/22/17 0101    phenol (CHLORASEPTIC) 1.4 % LIQD  As needed     10/22/17 0101       Emeline General, PA-C 10/22/17 [place], Ankit, MD 10/22/17 [phone]

## 2017-10-22 NOTE — Discharge Instructions (Signed)
As discussed, your chest xray was negative for pneumonia and your rapid strep negative 2 days ago. Lungs are clear and perfect sensation discrimination when tested with sharp or dull object.  Use tea with honey, cough drops and throat sprays to help with your sore throat.  Nasal spray as needed to help with congestion and moisturize the nasal passages.  Follow-up with your primary care provider.  Return if symptoms worsen or new concerning symptoms in the meantime.

## 2017-10-31 ENCOUNTER — Other Ambulatory Visit: Payer: Self-pay

## 2017-10-31 ENCOUNTER — Encounter (HOSPITAL_COMMUNITY): Payer: Self-pay | Admitting: Emergency Medicine

## 2017-10-31 ENCOUNTER — Emergency Department (HOSPITAL_COMMUNITY)
Admission: EM | Admit: 2017-10-31 | Discharge: 2017-10-31 | Disposition: A | Discharge status: Home / Self Care | Payer: Medicaid Other | Attending: Emergency Medicine | Admitting: Emergency Medicine

## 2017-10-31 DIAGNOSIS — R112 Nausea with vomiting, unspecified: Secondary | ICD-10-CM

## 2017-10-31 DIAGNOSIS — J45909 Unspecified asthma, uncomplicated: Secondary | ICD-10-CM | POA: Insufficient documentation

## 2017-10-31 DIAGNOSIS — Z79899 Other long term (current) drug therapy: Secondary | ICD-10-CM | POA: Insufficient documentation

## 2017-10-31 DIAGNOSIS — Z87891 Personal history of nicotine dependence: Secondary | ICD-10-CM | POA: Insufficient documentation

## 2017-10-31 LAB — CBC
HCT: 40.3 % (ref 39.0–52.0)
Hemoglobin: 13.5 g/dL (ref 13.0–17.0)
MCH: 26.9 pg (ref 26.0–34.0)
MCHC: 33.5 g/dL (ref 30.0–36.0)
MCV: 80.4 fL (ref 78.0–100.0)
Platelets: 287 10*3/uL (ref 150–400)
RBC: 5.01 MIL/uL (ref 4.22–5.81)
RDW: 14.1 % (ref 11.5–15.5)
WBC: 8.8 10*3/uL (ref 4.0–10.5)

## 2017-10-31 LAB — URINALYSIS, ROUTINE W REFLEX MICROSCOPIC
Bilirubin Urine: NEGATIVE
Glucose, UA: NEGATIVE mg/dL
Hgb urine dipstick: NEGATIVE
Ketones, ur: NEGATIVE mg/dL
Leukocytes, UA: NEGATIVE
Nitrite: NEGATIVE
Protein, ur: NEGATIVE mg/dL
Specific Gravity, Urine: 1.024 (ref 1.005–1.030)
pH: 5 (ref 5.0–8.0)

## 2017-10-31 LAB — COMPREHENSIVE METABOLIC PANEL
ALT: 21 U/L (ref 17–63)
AST: 24 U/L (ref 15–41)
Albumin: 4 g/dL (ref 3.5–5.0)
Alkaline Phosphatase: 65 U/L (ref 38–126)
Anion gap: 9 (ref 5–15)
BUN: 10 mg/dL (ref 6–20)
CO2: 23 mmol/L (ref 22–32)
Calcium: 9.7 mg/dL (ref 8.9–10.3)
Chloride: 105 mmol/L (ref 101–111)
Creatinine, Ser: 0.88 mg/dL (ref 0.61–1.24)
GFR calc Af Amer: >60 mL/min (ref >60)
GFR calc non Af Amer: >60 mL/min (ref >60)
Glucose, Bld: 98 mg/dL (ref 65–99)
Potassium: 4.4 mmol/L (ref 3.5–5.1)
Sodium: 137 mmol/L (ref 135–145)
Total Bilirubin: 0.6 mg/dL (ref 0.3–1.2)
Total Protein: 7.8 g/dL (ref 6.5–8.1)

## 2017-10-31 LAB — LIPASE, BLOOD: Lipase: 24 U/L (ref 11–51)

## 2017-10-31 MED ORDER — ONDANSETRON 4 MG PO TBDP
4.0000 mg | ORAL_TABLET | Freq: Once | ORAL | Status: AC
  Administered 2017-10-31: 4 mg via ORAL
  Filled 2017-10-31: qty 1

## 2017-10-31 MED ORDER — ONDANSETRON 4 MG PO TBDP: 4.0000 mg | ORAL_TABLET | Freq: Three times a day (TID) | ORAL | 1 refills | Status: DC | PRN

## 2017-10-31 NOTE — ED Notes (Signed)
Pt sleeping at this time. Has not vomited since being brought back to room.

## 2017-10-31 NOTE — ED Notes (Signed)
Pt given crackers, peanut butter, and ginger ale.

## 2017-10-31 NOTE — ED Triage Notes (Signed)
Pt c/o emesis starting today, 6 episodes of vomiting, denies diarrhea and fever

## 2017-10-31 NOTE — Discharge Instructions (Signed)
We recommend that you have clear liquid diet for the next 2 to 3 days, and advance if you are doing well.

## 2017-11-01 NOTE — ED Provider Notes (Signed)
[facility] EMERGENCY DEPARTMENT Provider Note   CSN: [id number] Arrival date & time: 10/31/17  1934     History   Chief Complaint Chief Complaint  Patient presents with  . Emesis    HPI Wayne Avila is a 27 y.o. male.  HPI 27 year old male with history of ulcerative colitis, autoimmune hepatitis, previous history of C. difficile colitis comes in with chief complaint of nausea and vomiting.  Patient accompanied by his mother who provides majority of the history.  Patient himself states that he started having nausea with vomiting this afternoon.  Emesis x5 prior to ED arrival which has been nonbilious and nonbloody.  Patient does not think that there was any suspicious p.o. intake that led to his symptoms.  He has no abdominal pain.  Mother states that patient has history of UC, and typically his flareups include severe pain with diarrhea.  She denies any sick contacts and there has been no fevers or chills.  Patient's last emesis was prior to ED arrival.   Past Medical History:  Diagnosis Date  . ADHD (attention deficit hyperactivity disorder)   . Anxiety   . Asthma   . Autoimmune hepatitis ([place])   . Bipolar 1 disorder ([place])   . C. difficile colitis 03/22/2016  . Clostridium difficile colitis   . Depression   . GERD (gastroesophageal reflux disease)   . HOH (hard of hearing)   . Mental developmental delay   . Seizures ([place])   . Ulcerative colitis [facility]     Patient Active Problem List   Diagnosis Date Noted  . Bipolar 1 disorder ([place]) 01/20/2017  . ADHD 01/09/2017  . Environmental allergies 01/09/2017  . Class 1 obesity due to excess calories with body mass index (BMI) of 34.0 to 34.9 in adult 01/09/2017  . Autoimmune hepatitis ([place]) 01/02/2017  . Asthma in adult, mild intermittent, uncomplicated 37/48/2707  . Anemia 12/31/2016  . Hyperammonemia ([place]) 12/31/2016  . Elevated LFTs 12/31/2016  . Abnormal thyroid function test 03/24/2016  . Ulcerative colitis ([place])  12/29/2015  . Bipolar 1 disorder, manic, moderate ([place]) 11/09/2014  . Mental developmental delay 11/15/2012    Past Surgical History:  Procedure Laterality Date  . ADENOIDECTOMY    . BIOPSY N/A 12/07/2012   Procedure: GASTRIC BIOPSIES;  Surgeon: [name], MD;  Location: [facility] ORS;  Service: Endoscopy;  Laterality: N/A;  . BIOPSY N/A 08/30/2013   Procedure: BIOPSY;  Surgeon: [name], MD;  Location: [facility] ORS;  Service: Endoscopy;  Laterality: N/A;  right colon,transverse colon, left colon, rectal biopsies  . BIOPSY  11/20/2015   Procedure: BIOPSY;  Surgeon: [name], MD;  Location: [facility] ENDO SUITE;  Service: Endoscopy;;  ileal, right colon biopsy, left colon, rectum  . BIOPSY  04/18/2016   Procedure: BIOPSY;  Surgeon: [name], MD;  Location: [facility] ENDO SUITE;  Service: Endoscopy;;  left descending colon biopsies  . COLONOSCOPY  11/20/2015   Dr. [name]: Severe erythema, edema, ulcers from the anal verge to 20 cm above the anal verge without mucosal sparing, remaining colon and terminal ileum appeared normal. Biopsies from the rectum revealed fulminant active chronic colitis consistent with IBD. Pathology from terminal ileum revealed intramucosal lymphoid aggregates. Remaining colon random biopsies with inactive chronic colitis  . COLONOSCOPY WITH PROPOFOL N/A 08/30/2013   EML:JQGBEE mucosa in the terminal ileum/COLITIS/ MILD PROCTITIS. 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  Marland Kitchen  COLONOSCOPY WITH PROPOFOL N/A 11/20/2015   Dr. 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 ESOPHAGOGASTRODUODENOSCOPY  11/20/2015   Dr. [name]: Normal exam, stomach biopsied and duodenal biopsy.. Benign biopsies.  . ESOPHAGOGASTRODUODENOSCOPY (EGD) WITH PROPOFOL N/A 12/07/2012   SLF:The mucosa of the esophagus appeared normal Non-erosive gastritis (inflammation) was  found in the gastric antrum; multiple biopsies The duodenal mucosa showed no abnormalities in the bulb and second portion of the duodenum  . ESOPHAGOGASTRODUODENOSCOPY (EGD) WITH PROPOFOL N/A 11/20/2015   Dr. [name]: normal with normal biopsies, negative H.pylori   . ESOPHAGOGASTRODUODENOSCOPY (EGD) WITH PROPOFOL N/A 04/18/2016   Procedure: ESOPHAGOGASTRODUODENOSCOPY (EGD) WITH PROPOFOL;  Surgeon: [name], MD;  [facility];  Service: Endoscopy;  Laterality: N/A;  . FLEXIBLE SIGMOIDOSCOPY N/A 04/18/2016   Procedure: FLEXIBLE SIGMOIDOSCOPY;  Surgeon: [name], MD;  Location: [facility] END[facility];  Service: Endoscopy;  Laterality: N/A;  . TONSILLECTOMY          Home Medications    Prior to Admission medications   Medication Sig Start Date End Date Taking? Authorizing Provider  albuterol (PROVENTIL HFA) 108 (90 Base) MCG/ACT inhaler Inhale into the lungs.    [provider]  albuterol (PROVENTIL) (2.5 MG/3ML) 0.083% nebulizer solution Inhale 3 mLs (2.5 mg total) into the lungs every 6 (six) hours as needed for wheezing or shortness of breath. 04/29/17   Raylene Everts, MD  CANASA 1000 MG suppository 1 suppository daily. 08/20/17   [provider]  cholecalciferol (VITAMIN D) 1000 units tablet Take 1 tablet (1,000 Units total) by mouth daily. START AFTER VITAMIN D 50,000 U TABLETS ARE COMPLETE 04/08/16   Fields, Marga Melnick, MD  cloNIDine HCl (KAPVAY) 0.1 MG TB12 ER tablet Take 0.1 mg by mouth 2 (two) times daily.    [provider]  cyclobenzaprine (FLEXERIL) 5 MG tablet Take 1 tablet (5 mg total) by mouth 2 (two) times daily as needed for muscle spasms. 10/01/17   Evalee Jefferson, PA-C  dicyclomine (BENTYL) 20 MG tablet Take 1 tablet (20 mg total) by mouth 2 (two) times daily. 08/12/17   Rancour, Annie Main, MD  diphenoxylate-atropine (LOMOTIL) 2.5-0.025 MG tablet Take 1 tablet by mouth 4 (four) times daily as needed for diarrhea or loose stools. 08/31/17    Raylene Everts, MD  EPINEPHrine (EPIPEN 2-PAK) 0.3 mg/0.3 mL IJ SOAJ injection Inject 0.3 mg into the muscle once as needed (for severe allergic reaction).    [provider]  famotidine (PEPCID) 20 MG tablet Take 1 tablet (20 mg total) by mouth 2 (two) times daily. 02/21/17   Milton Ferguson, MD  loratadine (CLARITIN) 10 MG tablet Take 10 mg by mouth. 01/09/17   [provider]  montelukast (SINGULAIR) 10 MG tablet Take 10 mg by mouth. 01/09/17   [provider]  ondansetron (ZOFRAN ODT) 4 MG disintegrating tablet Take 1 tablet (4 mg total) by mouth every 8 (eight) hours as needed. 10/31/17   Varney Biles, MD  Oxcarbazepine (TRILEPTAL) 300 MG tablet Take one tablet by mouth twice daily/as directed per prescriber 01/06/17   [provider]  paliperidone (INVEGA) 3 MG 24 hr tablet Take 3 mg by mouth daily.    [provider]  phenol (CHLORASEPTIC) 1.4 % LIQD Use as directed 1 spray in the mouth or throat as needed for throat irritation / pain. 10/22/17   Emeline General, PA-C  promethazine (PHENERGAN) 12.5 MG tablet TAKE 1 TABLET BY MOUTH EEVRY 6 HOURS  AS NEEDED 08/27/17   [provider]  propranolol (INDERAL) 10 MG tablet Take 1 tablet by mouth 3 (three) times daily. 08/06/17   [provider]  Respiratory Therapy Supplies (NEBULIZER COMPRESSOR) KIT And supplies 04/29/17   Raylene Everts, MD  sertraline (ZOLOFT) 50 MG tablet Take 75 mg by mouth daily.    [provider]  sodium chloride (OCEAN) 0.65 % SOLN nasal spray Place 1 spray into both nostrils as needed for congestion. 10/22/17   Emeline General, PA-C  traZODone (DESYREL) 50 MG tablet Take 50 mg by mouth at bedtime.    [provider]    Family History Family History  Problem Relation Age of Onset  . Asthma Mother   . Ulcers Mother   . Bipolar disorder Mother   . ADD / ADHD Father   . Diabetes Maternal Grandmother   . Hypertension Maternal  Grandmother   . Sleep apnea Maternal Grandmother   . Heart disease Maternal Grandmother   . Diabetes Maternal Grandfather   . Hypertension Maternal Grandfather   . Sleep apnea Maternal Grandfather   . Heart disease Maternal Grandfather   . Cancer Maternal Grandfather        prostate  . Cancer Maternal Aunt        Leukemia  . Colon cancer Neg Hx   . Liver disease Neg Hx     Social History Social History   Tobacco Use  . Smoking status: Former Smoker    Packs/day: 0.00    Years: 2.00    Pack years: 0.00    Last attempt to quit: 10/06/2012    Years since quitting: 5.0  . Smokeless tobacco: Never Used  . Tobacco comment: Never smoked cigarettes  Substance Use Topics  . Alcohol use: No    Alcohol/week: 0.0 oz    Comment: denies usage  . Drug use: No     Allergies   Nsaids; Pineapple; Remicade [infliximab]; Strawberry extract; Amoxicillin-pot clavulanate; Omeprazole; and Tomato   Review of Systems Review of Systems  Constitutional: Negative for activity change.  Respiratory: Negative for shortness of breath.   Cardiovascular: Negative for chest pain.  Gastrointestinal: Positive for nausea and vomiting. Negative for abdominal pain and diarrhea.  Allergic/Immunologic: Negative for immunocompromised state.  Hematological: Does not bruise/bleed easily.  All other systems reviewed and are negative.    Physical Exam Updated Vital Signs BP 120/80 (BP Location: Right Arm)   Pulse 89   Temp 98.5 F (36.9 C) (Oral)   Resp 17   Ht 5' 8"  (1.727 m)   Wt [place] kg (262 lb)   SpO2 100%   BMI 39.84 kg/m   Physical Exam  Constitutional: He is oriented to person, place, and time. He appears well-developed.  HENT:  Head: Atraumatic.  Neck: Neck supple.  Cardiovascular: Normal rate.  Pulmonary/Chest: Effort normal.  Abdominal: Soft. There is no tenderness.  Neurological: He is alert and oriented to person, place, and time.  Skin: Skin is warm.  Nursing note and vitals  reviewed.    ED Treatments / Results  Labs (all labs ordered are listed, but only abnormal results are displayed) Labs Reviewed  LIPASE, BLOOD  COMPREHENSIVE METABOLIC PANEL  CBC  URINALYSIS, ROUTINE W REFLEX MICROSCOPIC    EKG None  Radiology No results found.  Procedures Procedures (including critical care time)  Medications Ordered in ED Medications  ondansetron (ZOFRAN-ODT) disintegrating tablet 4 mg (4 mg Oral Given 10/31/17 2220)     Initial Impression /  Assessment and Plan / ED Course  I have reviewed the triage vital signs and the nursing notes.  Pertinent labs & imaging results that were available during my care of the patient were reviewed by me and considered in my medical decision making (see chart for details).  Clinical Course as of Nov 02 1515  Sat Oct 31, 2017  2250 Pt reassessed. Pt's VSS and WNL. Pt's cap refill < 3 seconds. Pt has been hydrated in the ER and now passed po challenge. We will discharge with antiemetic. Strict ER return precautions have been discussed and pt will return if he is unable to tolerate fluids and symptoms are getting worse.    [AN]    Clinical Course User Index [AN] Varney Biles, MD    Patient comes in chief complaint of nausea with vomiting.  Patient has history of UC and C. difficile colitis.  His symptoms started prior to ED arrival and he has had emesis x5.  Last emesis was prior to ED arrival.  On exam patient is soft abdomen which is not tender.  Mucous membranes are moist still. We will start oral challenge right away. Labs have been ordered from triage -and they look reassuring.  Final Clinical Impressions(s) / ED Diagnoses   Final diagnoses:  Non-intractable vomiting with nausea, unspecified vomiting type    ED Discharge Orders        Ordered    ondansetron (ZOFRAN ODT) 4 MG disintegrating tablet  Every 8 hours PRN     10/31/17 2251       Varney Biles, MD 11/01/17 1523

## 2017-11-03 ENCOUNTER — Ambulatory Visit: Admit: 2017-11-03 | Discharge: 2017-11-03 | Payer: MEDICAID

## 2017-11-03 DIAGNOSIS — K51811 Other ulcerative colitis with rectal bleeding: Principal | ICD-10-CM

## 2017-11-13 IMAGING — DX DG ABDOMEN 2V
3 series · 3 of 3 positions shown · non-contrast
Comparison: Or 11/21/2015 CT

CLINICAL DATA: Increasing abdominal pain

EXAM:
ABDOMEN - 2 VIEW

[abdomen erect]
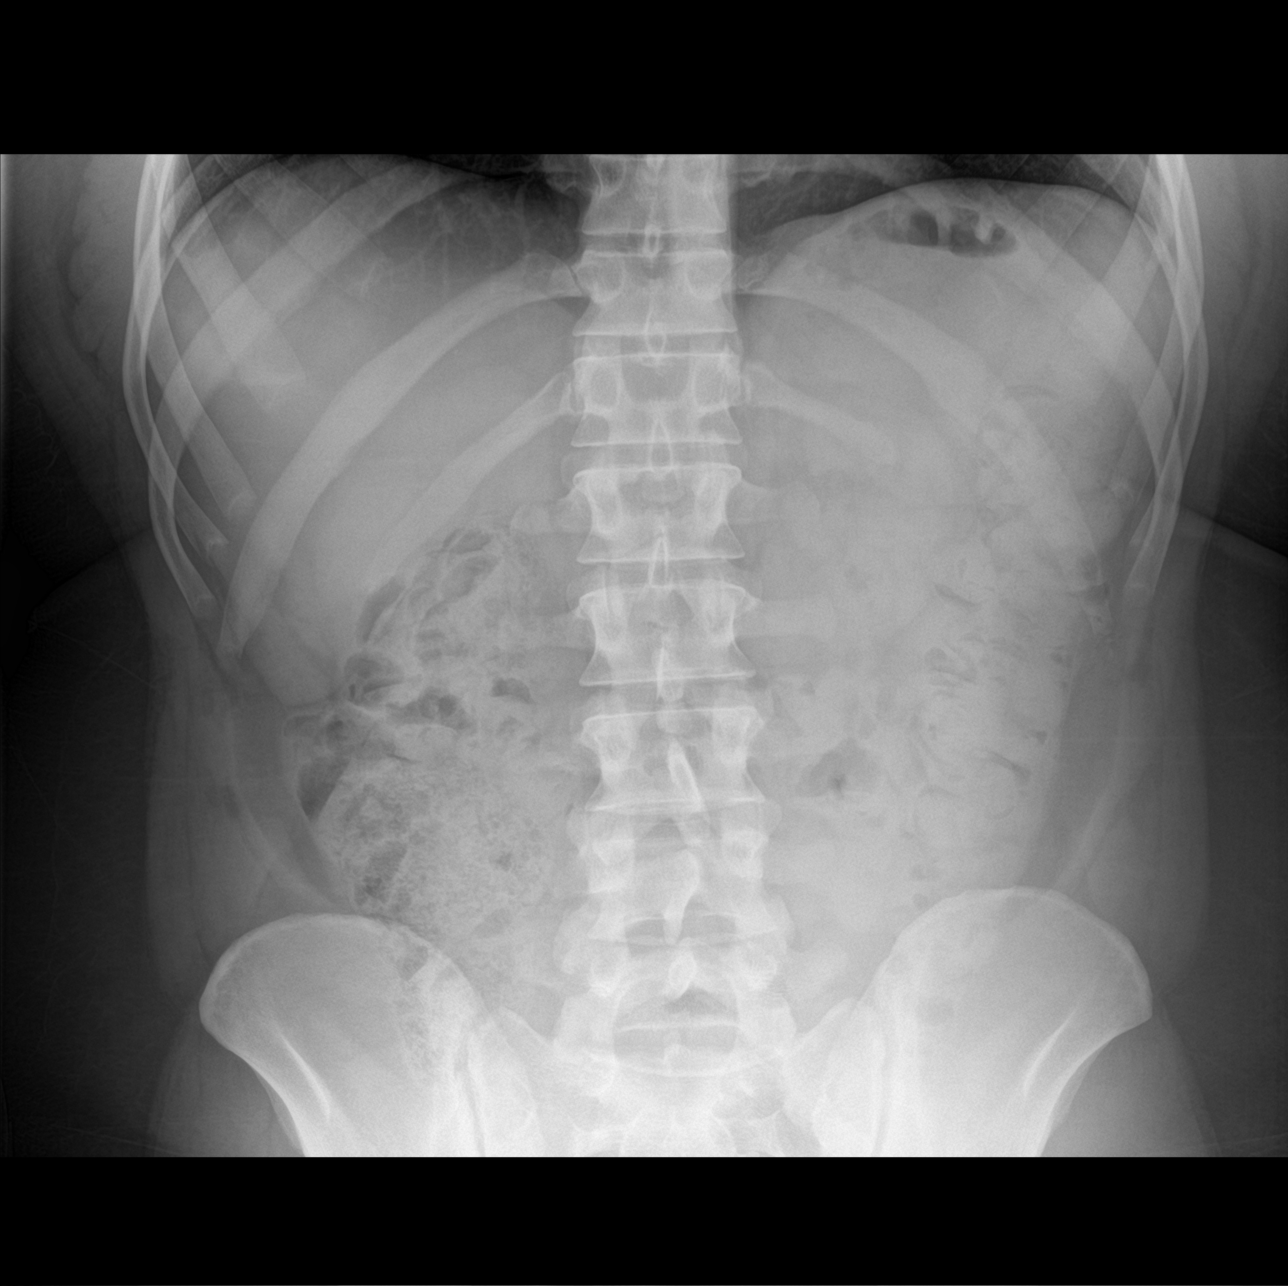

[abdomen supine (1 of 2)]
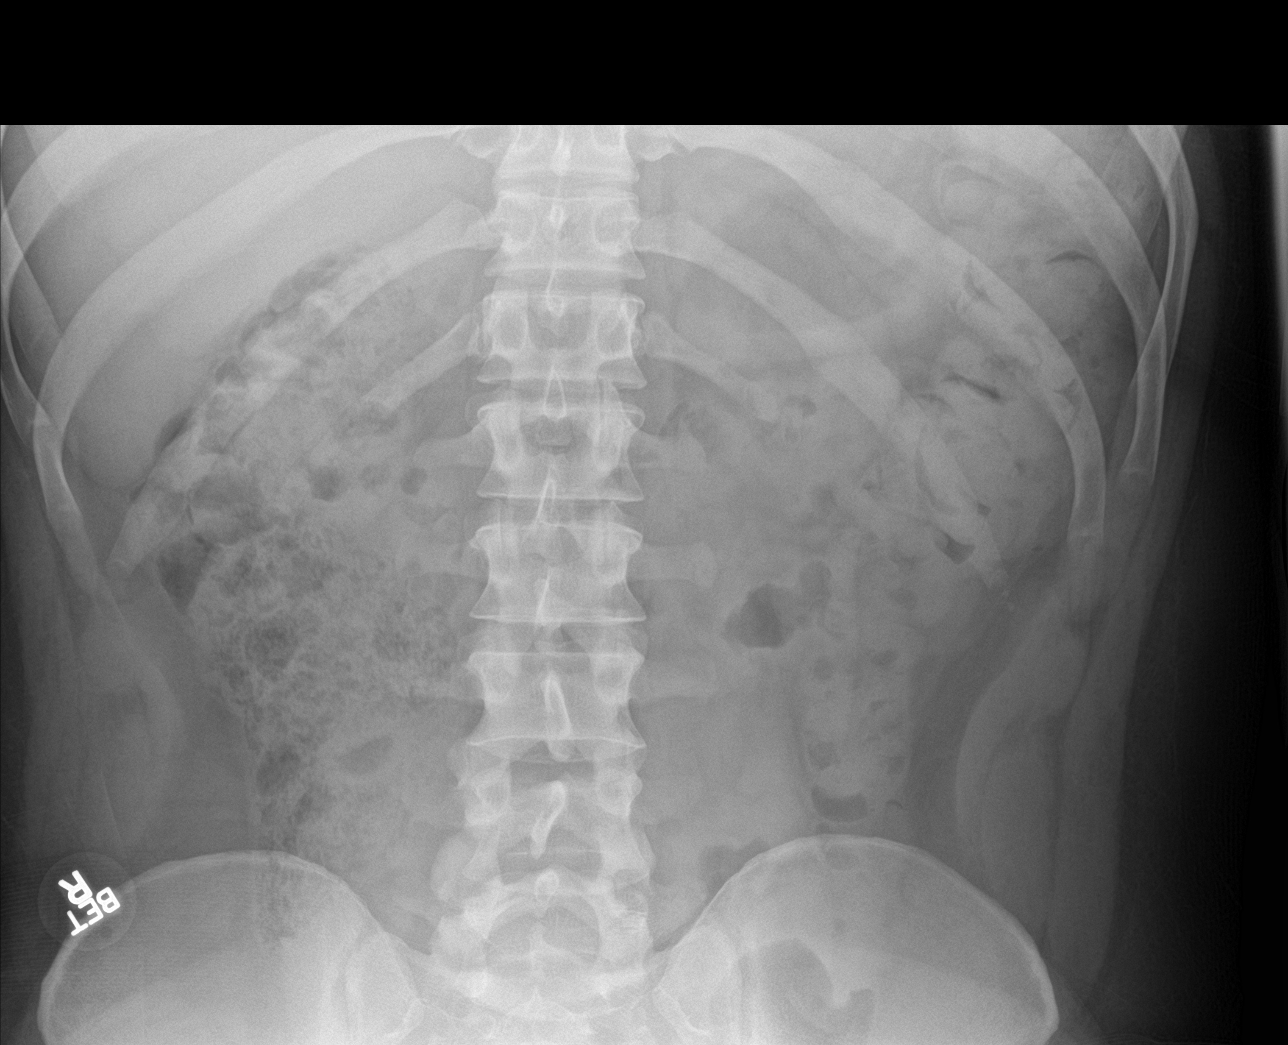

[abdomen supine (2 of 2)]
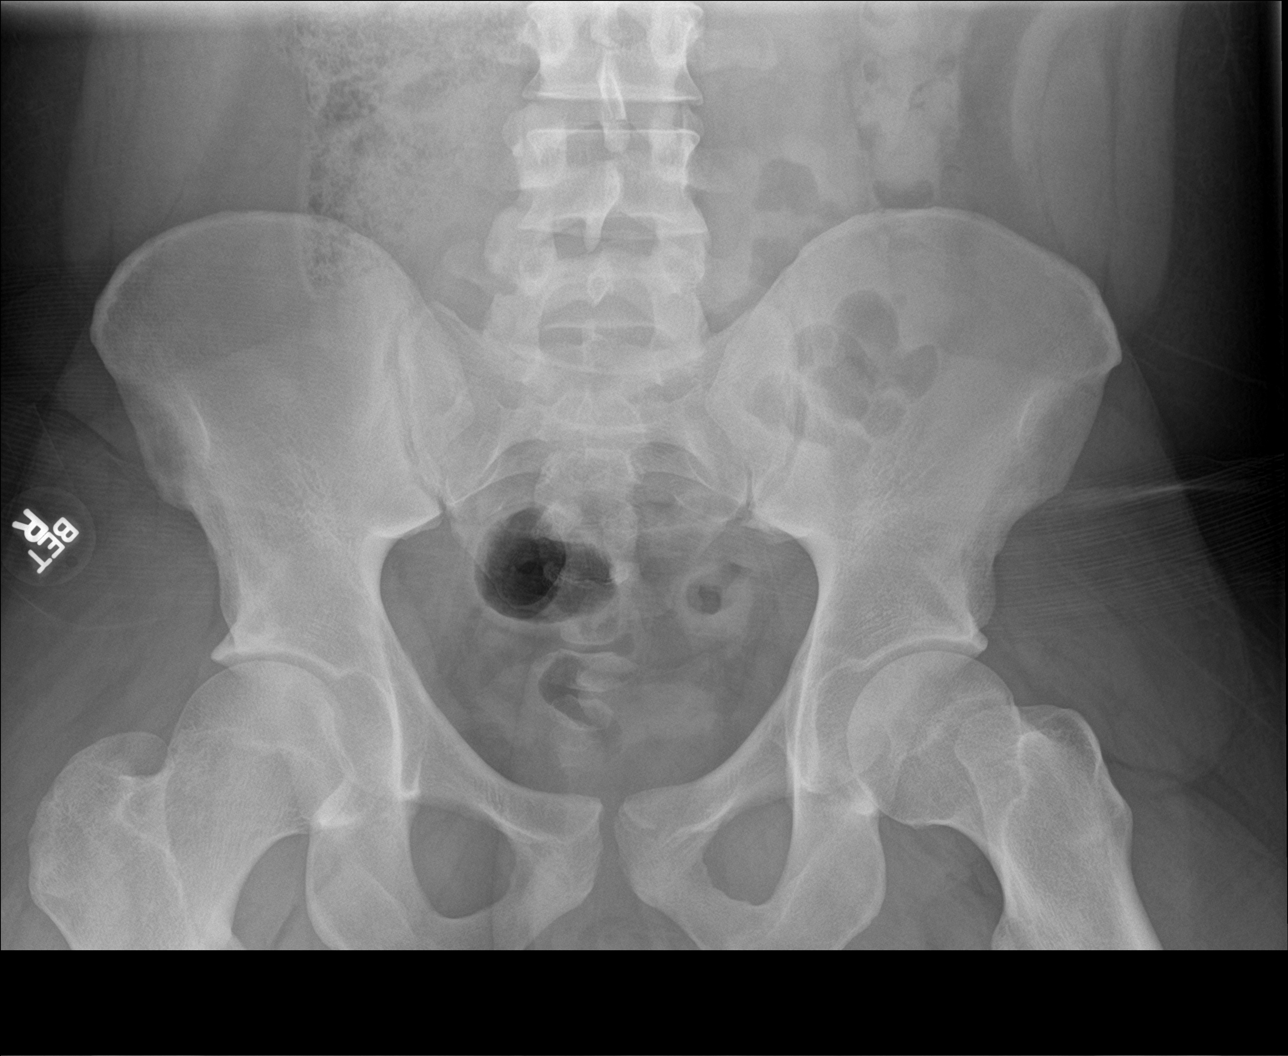

[3 of 3 positions shown; findings below may reference images not displayed]

FINDINGS: Moderate volume of formed stool from the ascending segment to the
descending segment. No impaction or obstruction. Negative for
pneumatosis or pneumoperitoneum. No concerning abdominal mass effect
or calcification. Clear lung bases.
IMPRESSION: Possible constipation.  No obstruction or impaction.

## 2017-12-25 ENCOUNTER — Emergency Department (HOSPITAL_COMMUNITY)
Admission: EM | Admit: 2017-12-25 | Discharge: 2017-12-25 | Disposition: A | Discharge status: Home / Self Care | Payer: Medicaid Other | Attending: Emergency Medicine | Admitting: Emergency Medicine

## 2017-12-25 ENCOUNTER — Encounter (HOSPITAL_COMMUNITY): Payer: Self-pay | Admitting: Emergency Medicine

## 2017-12-25 ENCOUNTER — Other Ambulatory Visit: Payer: Self-pay

## 2017-12-25 DIAGNOSIS — J45909 Unspecified asthma, uncomplicated: Secondary | ICD-10-CM | POA: Diagnosis not present

## 2017-12-25 DIAGNOSIS — Z79899 Other long term (current) drug therapy: Secondary | ICD-10-CM | POA: Diagnosis not present

## 2017-12-25 DIAGNOSIS — Z87891 Personal history of nicotine dependence: Secondary | ICD-10-CM | POA: Insufficient documentation

## 2017-12-25 DIAGNOSIS — K591 Functional diarrhea: Secondary | ICD-10-CM | POA: Diagnosis not present

## 2017-12-25 DIAGNOSIS — R197 Diarrhea, unspecified: Secondary | ICD-10-CM | POA: Diagnosis present

## 2017-12-25 LAB — CBC
HCT: 36.9 % — ABNORMAL LOW (ref 39.0–52.0)
Hemoglobin: 12.4 g/dL — ABNORMAL LOW (ref 13.0–17.0)
MCH: 27.4 pg (ref 26.0–34.0)
MCHC: 33.6 g/dL (ref 30.0–36.0)
MCV: 81.6 fL (ref 78.0–100.0)
Platelets: 240 10*3/uL (ref 150–400)
RBC: 4.52 MIL/uL (ref 4.22–5.81)
RDW: 14.3 % (ref 11.5–15.5)
WBC: 6.4 10*3/uL (ref 4.0–10.5)

## 2017-12-25 LAB — COMPREHENSIVE METABOLIC PANEL
ALT: 23 U/L (ref 0–44)
AST: 23 U/L (ref 15–41)
Albumin: 3.8 g/dL (ref 3.5–5.0)
Alkaline Phosphatase: 79 U/L (ref 38–126)
Anion gap: 7 (ref 5–15)
BUN: 10 mg/dL (ref 6–20)
CO2: 21 mmol/L — ABNORMAL LOW (ref 22–32)
Calcium: 9.3 mg/dL (ref 8.9–10.3)
Chloride: 110 mmol/L (ref 98–111)
Creatinine, Ser: 0.89 mg/dL (ref 0.61–1.24)
GFR calc Af Amer: >60 mL/min (ref >60)
GFR calc non Af Amer: >60 mL/min (ref >60)
Glucose, Bld: 83 mg/dL (ref 70–99)
Potassium: 4 mmol/L (ref 3.5–5.1)
Sodium: 138 mmol/L (ref 135–145)
Total Bilirubin: 0.3 mg/dL (ref 0.3–1.2)
Total Protein: 7.2 g/dL (ref 6.5–8.1)

## 2017-12-25 MED ORDER — LOPERAMIDE HCL 2 MG PO CAPS: 4.0000 mg | ORAL_CAPSULE | Freq: Every evening | ORAL | 0 refills | Status: DC | PRN

## 2017-12-25 NOTE — ED Triage Notes (Signed)
Pt c/o watery stools x 5 days. Eating/drinking fine. Nad. Mm wet.

## 2017-12-25 NOTE — ED Notes (Signed)
Pt with hx of chronic D/, states 6 episodes of D/ today. No abd pain, N/V/, or decreased PO intake. Has not contacted PCP.

## 2017-12-25 NOTE — Discharge Instructions (Addendum)
Please take the diarrhea medication only for nighttime symptoms.  Return to the ER if you start having bloody stools, fevers, symptoms of dehydration such as severe weakness and near fainting.

## 2017-12-26 NOTE — ED Provider Notes (Signed)
[facility] EMERGENCY DEPARTMENT Provider Note   CSN: [id number] Arrival date & time: 12/25/17  1943     History   Chief Complaint Chief Complaint  Patient presents with  . Diarrhea    HPI Wayne Avila is a 27 y.o. male.  HPI  27 year old male with history of ulcerative colitis and C. difficile colitis comes in with chief complaint of diarrhea. Level 5 caveat, patient is not a good historian.  According to the patient he started having diarrhea 5 days ago.  He thinks that he has between 5-10 episodes of loose watery stools.  Patient denies any associated abdominal pain, vomiting, fevers, chills and no bloody stools.  Mother reports that patient did complain of abdominal discomfort at some point.  No sick contacts, no recent antibiotics.  Mother gave loperamide at home without significant relief.  Past Medical History:  Diagnosis Date  . ADHD (attention deficit hyperactivity disorder)   . Anxiety   . Asthma   . Autoimmune hepatitis ([place])   . Bipolar 1 disorder ([place])   . C. difficile colitis 03/22/2016  . Clostridium difficile colitis   . Depression   . GERD (gastroesophageal reflux disease)   . HOH (hard of hearing)   . Mental developmental delay   . Seizures ([place])   . Ulcerative colitis [facility])     Patient Active Problem List   Diagnosis Date Noted  . Bipolar 1 disorder ([place]) 01/20/2017  . ADHD 01/09/2017  . Environmental allergies 01/09/2017  . Class 1 obesity due to excess calories with body mass index (BMI) of 34.0 to 34.9 in adult 01/09/2017  . Autoimmune hepatitis ([place]) 01/02/2017  . Asthma in adult, mild intermittent, uncomplicated 15/83/0940  . Anemia 12/31/2016  . Hyperammonemia ([place]) 12/31/2016  . Elevated LFTs 12/31/2016  . Abnormal thyroid function test 03/24/2016  . Ulcerative colitis ([place]) 12/29/2015  . Bipolar 1 disorder, manic, moderate ([place]) 11/09/2014  . Mental developmental delay 11/15/2012    Past Surgical History:  Procedure Laterality  Date  . ADENOIDECTOMY    . BIOPSY N/A 12/07/2012   Procedure: GASTRIC BIOPSIES;  Surgeon: [name], MD;  Location: [facility] ORS;  Service: Endoscopy;  Laterality: N/A;  . BIOPSY N/A 08/30/2013   Procedure: BIOPSY;  Surgeon: [name], MD;  Location: [facility] ORS;  Service: Endoscopy;  Laterality: N/A;  right colon,transverse colon, left colon, rectal biopsies  . BIOPSY  11/20/2015   Procedure: BIOPSY;  Surgeon: [name], MD;  Location: [facility] END[facility];  Service: Endoscopy;;  ileal, right colon biopsy, left colon, rectum  . BIOPSY  04/18/2016   Procedure: BIOPSY;  Surgeon: [name], MD;  Location: [facility];  Service: Endoscopy;;  left descending colon biopsies  . COLONOSCOPY  11/20/2015   Dr. [name]: Severe erythema, edema, ulcers from the anal verge to 20 cm above the anal verge without mucosal sparing, remaining colon and terminal ileum appeared normal. Biopsies from the rectum revealed fulminant active chronic colitis consistent with IBD. Pathology from terminal ileum revealed intramucosal lymphoid aggregates. Remaining colon random biopsies with inactive chronic colitis  . COLONOSCOPY WITH PROPOFOL N/A 08/30/2013   HWK:GSUPJS mucosa in the terminal ileum/COLITIS/ MILD PROCTITIS. 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  Marland Kitchen COLONOSCOPY WITH PROPOFOL N/A 11/20/2015   Dr. 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 ESOPHAGOGASTRODUODENOSCOPY  11/20/2015   Dr. [name]: Normal exam, stomach  biopsied and duodenal biopsy.. Benign biopsies.  . ESOPHAGOGASTRODUODENOSCOPY (EGD) WITH PROPOFOL N/A 12/07/2012   SLF:The mucosa of the esophagus appeared normal Non-erosive gastritis (inflammation) was found in the gastric antrum; multiple biopsies The duodenal mucosa showed no abnormalities in the bulb and second portion of the duodenum  .  ESOPHAGOGASTRODUODENOSCOPY (EGD) WITH PROPOFOL N/A 11/20/2015   Dr. [name]: normal with normal biopsies, negative H.pylori   . ESOPHAGOGASTRODUODENOSCOPY (EGD) WITH PROPOFOL N/A 04/18/2016   Procedure: ESOPHAGOGASTRODUODENOSCOPY (EGD) WITH PROPOFOL;  Surgeon: [name], MD;  [facility];  Service: Endoscopy;  Laterality: N/A;  . FLEXIBLE SIGMOIDOSCOPY N/A 04/18/2016   Procedure: FLEXIBLE SIGMOIDOSCOPY;  Surgeon: [name], MD;  Location: [facility];  Service: Endoscopy;  Laterality: N/A;  . TONSILLECTOMY          Home Medications    Prior to Admission medications   Medication Sig Start Date End Date Taking? Authorizing Provider  albuterol (PROVENTIL HFA) 108 (90 Base) MCG/ACT inhaler Inhale into the lungs.    [provider]  albuterol (PROVENTIL) (2.5 MG/3ML) 0.083% nebulizer solution Inhale 3 mLs (2.5 mg total) into the lungs every 6 (six) hours as needed for wheezing or shortness of breath. 04/29/17   Raylene Everts, MD  CANASA 1000 MG suppository 1 suppository daily. 08/20/17   [provider]  cholecalciferol (VITAMIN D) 1000 units tablet Take 1 tablet (1,000 Units total) by mouth daily. START AFTER VITAMIN D 50,000 U TABLETS ARE COMPLETE 04/08/16   Fields, Marga Melnick, MD  cloNIDine HCl (KAPVAY) 0.1 MG TB12 ER tablet Take 0.1 mg by mouth 2 (two) times daily.    [provider]  cyclobenzaprine (FLEXERIL) 5 MG tablet Take 1 tablet (5 mg total) by mouth 2 (two) times daily as needed for muscle spasms. 10/01/17   Evalee Jefferson, PA-C  dicyclomine (BENTYL) 20 MG tablet Take 1 tablet (20 mg total) by mouth 2 (two) times daily. 08/12/17   Rancour, Annie Main, MD  diphenoxylate-atropine (LOMOTIL) 2.5-0.025 MG tablet Take 1 tablet by mouth 4 (four) times daily as needed for diarrhea or loose stools. 08/31/17   Raylene Everts, MD  EPINEPHrine (EPIPEN 2-PAK) 0.3 mg/0.3 mL IJ SOAJ injection Inject 0.3 mg into the muscle once as needed (for severe  allergic reaction).    [provider]  famotidine (PEPCID) 20 MG tablet Take 1 tablet (20 mg total) by mouth 2 (two) times daily. 02/21/17   Milton Ferguson, MD  loperamide (IMODIUM) 2 MG capsule Take 2 capsules (4 mg total) by mouth at bedtime as needed for diarrhea or loose stools. 12/25/17   Varney Biles, MD  loratadine (CLARITIN) 10 MG tablet Take 10 mg by mouth. 01/09/17   [provider]  montelukast (SINGULAIR) 10 MG tablet Take 10 mg by mouth. 01/09/17   [provider]  ondansetron (ZOFRAN ODT) 4 MG disintegrating tablet Take 1 tablet (4 mg total) by mouth every 8 (eight) hours as needed. 10/31/17   Varney Biles, MD  Oxcarbazepine (TRILEPTAL) 300 MG tablet Take one tablet by mouth twice daily/as directed per prescriber 01/06/17   [provider]  paliperidone (INVEGA) 3 MG 24 hr tablet Take 3 mg by mouth daily.    [provider]  phenol (CHLORASEPTIC) 1.4 % LIQD Use as directed 1 spray in the mouth or throat as needed for throat irritation / pain. 10/22/17   Emeline General, PA-C  promethazine (PHENERGAN) 12.5 MG tablet TAKE 1 TABLET BY MOUTH EEVRY 6 HOURS AS  NEEDED 08/27/17   [provider]  propranolol (INDERAL) 10 MG tablet Take 1 tablet by mouth 3 (three) times daily. 08/06/17   [provider]  Respiratory Therapy Supplies (NEBULIZER COMPRESSOR) KIT And supplies 04/29/17   Raylene Everts, MD  sertraline (ZOLOFT) 50 MG tablet Take 75 mg by mouth daily.    [provider]  sodium chloride (OCEAN) 0.65 % SOLN nasal spray Place 1 spray into both nostrils as needed for congestion. 10/22/17   Emeline General, PA-C  traZODone (DESYREL) 50 MG tablet Take 50 mg by mouth at bedtime.    [provider]    Family History Family History  Problem Relation Age of Onset  . Asthma Mother   . Ulcers Mother   . Bipolar disorder Mother   . ADD / ADHD Father   . Diabetes Maternal Grandmother   . Hypertension  Maternal Grandmother   . Sleep apnea Maternal Grandmother   . Heart disease Maternal Grandmother   . Diabetes Maternal Grandfather   . Hypertension Maternal Grandfather   . Sleep apnea Maternal Grandfather   . Heart disease Maternal Grandfather   . Cancer Maternal Grandfather        prostate  . Cancer Maternal Aunt        Leukemia  . Colon cancer Neg Hx   . Liver disease Neg Hx     Social History Social History   Tobacco Use  . Smoking status: Former Smoker    Packs/day: 0.00    Years: 2.00    Pack years: 0.00    Last attempt to quit: 10/06/2012    Years since quitting: 5.2  . Smokeless tobacco: Never Used  . Tobacco comment: Never smoked cigarettes  Substance Use Topics  . Alcohol use: No    Alcohol/week: 0.0 oz    Comment: denies usage  . Drug use: No     Allergies   Nsaids; Pineapple; Remicade [infliximab]; Strawberry extract; Amoxicillin-pot clavulanate; Omeprazole; and Tomato   Review of Systems Review of Systems  Constitutional: Positive for activity change. Negative for fever.  Gastrointestinal: Positive for diarrhea.  Allergic/Immunologic: Negative for immunocompromised state.  Neurological: Negative for dizziness.     Physical Exam Updated Vital Signs BP 129/89   Pulse 77   Temp 98.9 F (37.2 C) (Oral)   Resp 15   Wt 120.2 kg (265 lb)   SpO2 98%   BMI 40.29 kg/m   Physical Exam  Constitutional: He is oriented to person, place, and time. He appears well-developed.  HENT:  Head: Atraumatic.  Neck: Neck supple.  Cardiovascular: Normal rate.  Pulmonary/Chest: Effort normal.  Abdominal: Soft. There is no tenderness.  Neurological: He is alert and oriented to person, place, and time.  Skin: Skin is warm.  Nursing note and vitals reviewed.    ED Treatments / Results  Labs (all labs ordered are listed, but only abnormal results are displayed) Labs Reviewed  COMPREHENSIVE METABOLIC PANEL - Abnormal; Notable for the following components:       Result Value   CO2 21 (*)    All other components within normal limits  CBC - Abnormal; Notable for the following components:   Hemoglobin 12.4 (*)    HCT 36.9 (*)    All other components within normal limits    EKG None  Radiology No results found.  Procedures Procedures (including critical care time)  Medications Ordered in ED Medications - No data to display   Initial Impression / Assessment and  Plan / ED Course  I have reviewed the triage vital signs and the nursing notes.  Pertinent labs & imaging results that were available during my care of the patient were reviewed by me and considered in my medical decision making (see chart for details).     27 year old male with history of ulcerative colitis and C. difficile colitis comes in with chief complaint of diarrhea.  Patient has not had any diarrhea in the 3-hour stay in the ED. There is no associated fevers, bloody stools and the abdominal exam is benign.  No risk factors for C. difficile at this time.  We will discharge patient home with loperamide that needs to be taken only at nighttime if needed. Otherwise we have advised him to follow-up with PCP.  Good hydration has been recommended.  Final Clinical Impressions(s) / ED Diagnoses   Final diagnoses:  Functional diarrhea    ED Discharge Orders        Ordered    loperamide (IMODIUM) 2 MG capsule  At bedtime PRN,   Status:  Discontinued     12/25/17 2244    loperamide (IMODIUM) 2 MG capsule  At bedtime PRN     12/25/17 2345       Varney Biles, MD 12/26/17 0040

## 2018-01-26 ENCOUNTER — Emergency Department (HOSPITAL_COMMUNITY)
Admission: EM | Admit: 2018-01-26 | Discharge: 2018-01-26 | Disposition: A | Discharge status: Home / Self Care | Payer: Medicaid Other | Attending: Emergency Medicine | Admitting: Emergency Medicine

## 2018-01-26 ENCOUNTER — Encounter (HOSPITAL_COMMUNITY): Payer: Self-pay | Admitting: Emergency Medicine

## 2018-01-26 ENCOUNTER — Emergency Department (HOSPITAL_COMMUNITY): Payer: Medicaid Other

## 2018-01-26 DIAGNOSIS — M79645 Pain in left finger(s): Secondary | ICD-10-CM | POA: Diagnosis present

## 2018-01-26 DIAGNOSIS — F319 Bipolar disorder, unspecified: Secondary | ICD-10-CM | POA: Insufficient documentation

## 2018-01-26 DIAGNOSIS — Z87891 Personal history of nicotine dependence: Secondary | ICD-10-CM | POA: Diagnosis not present

## 2018-01-26 DIAGNOSIS — F909 Attention-deficit hyperactivity disorder, unspecified type: Secondary | ICD-10-CM | POA: Insufficient documentation

## 2018-01-26 DIAGNOSIS — Z79899 Other long term (current) drug therapy: Secondary | ICD-10-CM | POA: Insufficient documentation

## 2018-01-26 DIAGNOSIS — F419 Anxiety disorder, unspecified: Secondary | ICD-10-CM | POA: Diagnosis not present

## 2018-01-26 DIAGNOSIS — J45909 Unspecified asthma, uncomplicated: Secondary | ICD-10-CM | POA: Insufficient documentation

## 2018-01-26 NOTE — ED Triage Notes (Signed)
Pt reports he woke up with L pointer finger pain. Denies injury. No OTC medication.

## 2018-01-26 NOTE — ED Provider Notes (Signed)
[facility] EMERGENCY DEPARTMENT Provider Note   CSN: [id number] Arrival date & time: 01/26/18  1818     History   Chief Complaint Chief Complaint  Patient presents with  . Hand Pain    HPI Wayne Avila is a 27 y.o. male who presents with left index finger pain.  Past medical history significant for cognitive delay, ulcerative colitis, seizures, multiple ED visits.  The patient states that he woke up with acute onset of diffuse left index finger pain.  He cannot recall any injury.  He cannot recall doing anything yesterday that would have caused him pain today.  The pain is diffuse and is associated with mild swelling.  There are no wounds or redness.  He has decreased range of motion of his finger due to the swelling.  He has not tried anything for pain.  HPI  Past Medical History:  Diagnosis Date  . ADHD (attention deficit hyperactivity disorder)   . Anxiety   . Asthma   . Autoimmune hepatitis ([facility])   . Bipolar 1 disorder ([place])   . C. difficile colitis 03/22/2016  . Clostridium difficile colitis   . Depression   . GERD (gastroesophageal reflux disease)   . HOH (hard of hearing)   . Mental developmental delay   . Seizures ([place])   . Ulcerative colitis [facility])     Patient Active Problem List   Diagnosis Date Noted  . Bipolar 1 disorder ([place]) 01/20/2017  . ADHD 01/09/2017  . Environmental allergies 01/09/2017  . Class 1 obesity due to excess calories with body mass index (BMI) of 34.0 to 34.9 in adult 01/09/2017  . Autoimmune hepatitis ([place]) 01/02/2017  . Asthma in adult, mild intermittent, uncomplicated 67/61/9509  . Anemia 12/31/2016  . Hyperammonemia ([place]) 12/31/2016  . Elevated LFTs 12/31/2016  . Abnormal thyroid function test 03/24/2016  . Ulcerative colitis ([place]) 12/29/2015  . Bipolar 1 disorder, manic, moderate ([place]) 11/09/2014  . Mental developmental delay 11/15/2012    Past Surgical History:  Procedure Laterality Date  . ADENOIDECTOMY    . BIOPSY N/A  12/07/2012   Procedure: GASTRIC BIOPSIES;  Surgeon: [name], MD;  Location: [facility] ORS;  Service: Endoscopy;  Laterality: N/A;  . BIOPSY N/A 08/30/2013   Procedure: BIOPSY;  Surgeon: [name], MD;  Location: [facility] ORS;  Service: Endoscopy;  Laterality: N/A;  right colon,transverse colon, left colon, rectal biopsies  . BIOPSY  11/20/2015   Procedure: BIOPSY;  Surgeon: [name], MD;  Location: [facility] ENDO SUITE;  Service: Endoscopy;;  ileal, right colon biopsy, left colon, rectum  . BIOPSY  04/18/2016   Procedure: BIOPSY;  Surgeon: [name], MD;  Location: [facility] ENDO SUITE;  Service: Endoscopy;;  left descending colon biopsies  . COLONOSCOPY  11/20/2015   Dr. [name]: Severe erythema, edema, ulcers from the anal verge to 20 cm above the anal verge without mucosal sparing, remaining colon and terminal ileum appeared normal. Biopsies from the rectum revealed fulminant active chronic colitis consistent with IBD. Pathology from terminal ileum revealed intramucosal lymphoid aggregates. Remaining colon random biopsies with inactive chronic colitis  . COLONOSCOPY WITH PROPOFOL N/A 08/30/2013   TOI:ZTIWPY mucosa in the terminal ileum/COLITIS/ MILD PROCTITIS. 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  Marland Kitchen COLONOSCOPY WITH PROPOFOL N/A 11/20/2015   Dr. 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 ESOPHAGOGASTRODUODENOSCOPY  11/20/2015   Dr. [name]:  Normal exam, stomach biopsied and duodenal biopsy.. Benign biopsies.  . ESOPHAGOGASTRODUODENOSCOPY (EGD) WITH PROPOFOL N/A 12/07/2012   SLF:The mucosa of the esophagus appeared normal Non-erosive gastritis (inflammation) was found in the gastric antrum; multiple biopsies The duodenal mucosa showed no abnormalities in the bulb and second portion of the duodenum  . ESOPHAGOGASTRODUODENOSCOPY (EGD) WITH PROPOFOL N/A 11/20/2015    Dr. [name]: normal with normal biopsies, negative H.pylori   . ESOPHAGOGASTRODUODENOSCOPY (EGD) WITH PROPOFOL N/A 04/18/2016   Procedure: ESOPHAGOGASTRODUODENOSCOPY (EGD) WITH PROPOFOL;  Surgeon: [name], MD;  Location: [facility] END[facility];  Service: Endoscopy;  Laterality: N/A;  . FLEXIBLE SIGMOIDOSCOPY N/A 04/18/2016   Procedure: FLEXIBLE SIGMOIDOSCOPY;  Surgeon: [name], MD;  Location: [facility] END[facility];  Service: Endoscopy;  Laterality: N/A;  . TONSILLECTOMY          Home Medications    Prior to Admission medications   Medication Sig Start Date End Date Taking? Authorizing Provider  albuterol (PROVENTIL HFA) 108 (90 Base) MCG/ACT inhaler Inhale into the lungs.    [provider]  albuterol (PROVENTIL) (2.5 MG/3ML) 0.083% nebulizer solution Inhale 3 mLs (2.5 mg total) into the lungs every 6 (six) hours as needed for wheezing or shortness of breath. 04/29/17   Raylene Everts, MD  CANASA 1000 MG suppository 1 suppository daily. 08/20/17   [provider]  cholecalciferol (VITAMIN D) 1000 units tablet Take 1 tablet (1,000 Units total) by mouth daily. START AFTER VITAMIN D 50,000 U TABLETS ARE COMPLETE 04/08/16   Fields, Marga Melnick, MD  cloNIDine HCl (KAPVAY) 0.1 MG TB12 ER tablet Take 0.1 mg by mouth 2 (two) times daily.    [provider]  cyclobenzaprine (FLEXERIL) 5 MG tablet Take 1 tablet (5 mg total) by mouth 2 (two) times daily as needed for muscle spasms. 10/01/17   Evalee Jefferson, PA-C  dicyclomine (BENTYL) 20 MG tablet Take 1 tablet (20 mg total) by mouth 2 (two) times daily. 08/12/17   Rancour, Annie Main, MD  diphenoxylate-atropine (LOMOTIL) 2.5-0.025 MG tablet Take 1 tablet by mouth 4 (four) times daily as needed for diarrhea or loose stools. 08/31/17   Raylene Everts, MD  EPINEPHrine (EPIPEN 2-PAK) 0.3 mg/0.3 mL IJ SOAJ injection Inject 0.3 mg into the muscle once as needed (for severe allergic reaction).    [provider]  famotidine  (PEPCID) 20 MG tablet Take 1 tablet (20 mg total) by mouth 2 (two) times daily. 02/21/17   Milton Ferguson, MD  loperamide (IMODIUM) 2 MG capsule Take 2 capsules (4 mg total) by mouth at bedtime as needed for diarrhea or loose stools. 12/25/17   Varney Biles, MD  loratadine (CLARITIN) 10 MG tablet Take 10 mg by mouth. 01/09/17   [provider]  montelukast (SINGULAIR) 10 MG tablet Take 10 mg by mouth. 01/09/17   [provider]  ondansetron (ZOFRAN ODT) 4 MG disintegrating tablet Take 1 tablet (4 mg total) by mouth every 8 (eight) hours as needed. 10/31/17   Varney Biles, MD  Oxcarbazepine (TRILEPTAL) 300 MG tablet Take one tablet by mouth twice daily/as directed per prescriber 01/06/17   [provider]  paliperidone (INVEGA) 3 MG 24 hr tablet Take 3 mg by mouth daily.    [provider]  phenol (CHLORASEPTIC) 1.4 % LIQD Use as directed 1 spray in the mouth or throat as needed for throat irritation / pain. 10/22/17   Emeline General, PA-C  promethazine (PHENERGAN) 12.5 MG tablet TAKE 1 TABLET BY MOUTH EEVRY  6 HOURS AS NEEDED 08/27/17   [provider]  propranolol (INDERAL) 10 MG tablet Take 1 tablet by mouth 3 (three) times daily. 08/06/17   [provider]  Respiratory Therapy Supplies (NEBULIZER COMPRESSOR) KIT And supplies 04/29/17   Raylene Everts, MD  sertraline (ZOLOFT) 50 MG tablet Take 75 mg by mouth daily.    [provider]  sodium chloride (OCEAN) 0.65 % SOLN nasal spray Place 1 spray into both nostrils as needed for congestion. 10/22/17   Emeline General, PA-C  traZODone (DESYREL) 50 MG tablet Take 50 mg by mouth at bedtime.    [provider]    Family History Family History  Problem Relation Age of Onset  . Asthma Mother   . Ulcers Mother   . Bipolar disorder Mother   . ADD / ADHD Father   . Diabetes Maternal Grandmother   . Hypertension Maternal Grandmother   . Sleep apnea Maternal Grandmother   .  Heart disease Maternal Grandmother   . Diabetes Maternal Grandfather   . Hypertension Maternal Grandfather   . Sleep apnea Maternal Grandfather   . Heart disease Maternal Grandfather   . Cancer Maternal Grandfather        prostate  . Cancer Maternal Aunt        Leukemia  . Colon cancer Neg Hx   . Liver disease Neg Hx     Social History Social History   Tobacco Use  . Smoking status: Former Smoker    Packs/day: 0.00    Years: 2.00    Pack years: 0.00    Last attempt to quit: 10/06/2012    Years since quitting: 5.3  . Smokeless tobacco: Never Used  . Tobacco comment: Never smoked cigarettes  Substance Use Topics  . Alcohol use: No    Alcohol/week: 0.0 standard drinks    Comment: denies usage  . Drug use: No     Allergies   Nsaids; Pineapple; Remicade [infliximab]; Strawberry extract; Amoxicillin-pot clavulanate; Omeprazole; and Tomato   Review of Systems Review of Systems  Musculoskeletal: Positive for joint swelling.  Skin: Negative for wound.  Neurological: Negative for weakness and numbness.  All other systems reviewed and are negative.    Physical Exam Updated Vital Signs BP 123/72 (BP Location: Right Arm)   Pulse 67   Temp 98.6 F (37 C) (Oral)   Resp 18   Ht 5' 8.5" (1.74 m)   Wt 119.1 kg   SpO2 98%   BMI 39.34 kg/m   Physical Exam  Constitutional: He is oriented to person, place, and time. He appears well-developed and well-nourished. No distress.  HENT:  Head: Normocephalic and atraumatic.  Eyes: Pupils are equal, round, and reactive to light. Conjunctivae are normal. Right eye exhibits no discharge. Left eye exhibits no discharge. No scleral icterus.  Neck: Normal range of motion.  Cardiovascular: Normal rate.  Pulmonary/Chest: Effort normal. No respiratory distress.  Abdominal: He exhibits no distension.  Musculoskeletal:  Left hand: Extremely mild, diffuse swelling of the left index finger. No redness or wounds. Decreased ROM of the DIP  joint but otherwise normal ROM. Normal sensation. 2+ DP pulse  Neurological: He is alert and oriented to person, place, and time.  Skin: Skin is warm and dry.  Psychiatric: He has a normal mood and affect. His behavior is normal.  Nursing note and vitals reviewed.    ED Treatments / Results  Labs (all labs ordered are listed, but only abnormal results are displayed) Labs  Reviewed - No data to display  EKG None  Radiology Dg Finger Index Left  Result Date: 01/26/2018 CLINICAL DATA:  Left index finger pain without known injury. EXAM: LEFT INDEX FINGER 2+V COMPARISON:  10/05/2005 FINDINGS: There is no evidence of fracture or dislocation. There is no evidence of arthropathy or other focal bone abnormality. Mild soft tissue swelling of the left index finger. IMPRESSION: No acute osseous abnormality. Mild soft tissue swelling of the left index finger. Electronically Signed   By: Ashley Royalty M.D.   On: 01/26/2018 18:53    Procedures Procedures (including critical care time)  Medications Ordered in ED Medications - No data to display   Initial Impression / Assessment and Plan / ED Course  I have reviewed the triage vital signs and the nursing notes.  Pertinent labs & imaging results that were available during my care of the patient were reviewed by me and considered in my medical decision making (see chart for details).  27 year old male presents with atraumatic left index finger pain and swelling for the past day.  His vital signs are normal.  On exam there are no appreciable signs of trauma.  He has decreased range of motion of the finger due to pain.  X-rays remarkable for mild soft tissue swelling.  No signs of infection.  Suspect that the patient may have sustained a mild sprain which he was unaware of.  Advised ice, elevation, Tylenol for pain and reevaluation by PCP.  His mother is at bedside who is his legal guardian and is in agreement with plan.  Final Clinical Impressions(s) /  ED Diagnoses   Final diagnoses:  Finger pain, left    ED Discharge Orders    None       Recardo Evangelist, PA-C 01/26/18 Carollee Massed, MD 01/28/18 1622

## 2018-01-26 NOTE — Discharge Instructions (Signed)
Please elevate and ice the finger to help reduce swelling Take Tylenol for pain Return if you are worsening

## 2018-02-04 ENCOUNTER — Other Ambulatory Visit: Payer: Self-pay | Admitting: Family Medicine

## 2018-02-10 ENCOUNTER — Encounter (HOSPITAL_COMMUNITY): Payer: Self-pay | Admitting: Emergency Medicine

## 2018-02-10 ENCOUNTER — Other Ambulatory Visit: Payer: Self-pay

## 2018-02-10 ENCOUNTER — Emergency Department (HOSPITAL_COMMUNITY)
Admission: EM | Admit: 2018-02-10 | Discharge: 2018-02-11 | Disposition: A | Discharge status: Home / Self Care | Payer: Medicaid Other | Attending: Emergency Medicine | Admitting: Emergency Medicine

## 2018-02-10 ENCOUNTER — Emergency Department (HOSPITAL_COMMUNITY): Payer: Medicaid Other

## 2018-02-10 DIAGNOSIS — F909 Attention-deficit hyperactivity disorder, unspecified type: Secondary | ICD-10-CM | POA: Insufficient documentation

## 2018-02-10 DIAGNOSIS — Z87891 Personal history of nicotine dependence: Secondary | ICD-10-CM | POA: Insufficient documentation

## 2018-02-10 DIAGNOSIS — J069 Acute upper respiratory infection, unspecified: Secondary | ICD-10-CM | POA: Diagnosis not present

## 2018-02-10 DIAGNOSIS — F419 Anxiety disorder, unspecified: Secondary | ICD-10-CM | POA: Insufficient documentation

## 2018-02-10 DIAGNOSIS — J45909 Unspecified asthma, uncomplicated: Secondary | ICD-10-CM | POA: Diagnosis not present

## 2018-02-10 DIAGNOSIS — R0600 Dyspnea, unspecified: Secondary | ICD-10-CM | POA: Diagnosis present

## 2018-02-10 DIAGNOSIS — Z79899 Other long term (current) drug therapy: Secondary | ICD-10-CM | POA: Insufficient documentation

## 2018-02-10 DIAGNOSIS — F319 Bipolar disorder, unspecified: Secondary | ICD-10-CM | POA: Diagnosis not present

## 2018-02-10 NOTE — ED Triage Notes (Signed)
Pt comes in for nasal congestion X 2 days. No OTC medications.

## 2018-02-10 NOTE — ED Provider Notes (Signed)
[facility] EMERGENCY DEPARTMENT Provider Note   CSN: [id number] Arrival date & time: 02/10/18  2123     History   Chief Complaint Chief Complaint  Patient presents with  . Nasal Congestion    HPI Wayne Avila is a 27 y.o. male.  Patient with history of asthma, ulcerative colitis, bipolar disorder, history of C. difficile presenting with a 2-day history of difficulty breathing due to congestion in his nose.  He has not tried to take any medications at home.  He states he said rhinorrhea productive of clear mucus with streaks of blood.  Complains of pain to his face and feeling that he cannot breathe because he is so congested.  Denies any cough or chest pain.  Has felt shortness of breath and using his inhaler 4 times today.  Denies any fevers, chills, nausea or vomiting.  No abdominal pain today.  No sick contacts at home.  Has had previous issues with nasal congestion due to change in weather.  The history is provided by the patient and a parent.    Past Medical History:  Diagnosis Date  . ADHD (attention deficit hyperactivity disorder)   . Anxiety   . Asthma   . Autoimmune hepatitis ([place])   . Bipolar 1 disorder ([place])   . C. difficile colitis 03/22/2016  . Clostridium difficile colitis   . Depression   . GERD (gastroesophageal reflux disease)   . HOH (hard of hearing)   . Mental developmental delay   . Seizures ([place])   . Ulcerative colitis [facility])     Patient Active Problem List   Diagnosis Date Noted  . Bipolar 1 disorder ([place]) 01/20/2017  . ADHD 01/09/2017  . Environmental allergies 01/09/2017  . Class 1 obesity due to excess calories with body mass index (BMI) of 34.0 to 34.9 in adult 01/09/2017  . Autoimmune hepatitis ([place]) 01/02/2017  . Asthma in adult, mild intermittent, uncomplicated 21/19/4174  . Anemia 12/31/2016  . Hyperammonemia ([place]) 12/31/2016  . Elevated LFTs 12/31/2016  . Abnormal thyroid function test 03/24/2016  . Ulcerative colitis ([place])  12/29/2015  . Bipolar 1 disorder, manic, moderate ([place]) 11/09/2014  . Mental developmental delay 11/15/2012    Past Surgical History:  Procedure Laterality Date  . ADENOIDECTOMY    . BIOPSY N/A 12/07/2012   Procedure: GASTRIC BIOPSIES;  Surgeon: [name], MD;  Location: [facility] ORS;  Service: Endoscopy;  Laterality: N/A;  . BIOPSY N/A 08/30/2013   Procedure: BIOPSY;  Surgeon: [name], MD;  Location: [facility] ORS;  Service: Endoscopy;  Laterality: N/A;  right colon,transverse colon, left colon, rectal biopsies  . BIOPSY  11/20/2015   Procedure: BIOPSY;  Surgeon: [name], MD;  Location: [facility] ENDO SUITE;  Service: Endoscopy;;  ileal, right colon biopsy, left colon, rectum  . BIOPSY  04/18/2016   Procedure: BIOPSY;  Surgeon: [name], MD;  Location: [facility] END[facility];  Service: Endoscopy;;  left descending colon biopsies  . COLONOSCOPY  11/20/2015   Dr. [name]: Severe erythema, edema, ulcers from the anal verge to 20 cm above the anal verge without mucosal sparing, remaining colon and terminal ileum appeared normal. Biopsies from the rectum revealed fulminant active chronic colitis consistent with IBD. Pathology from terminal ileum revealed intramucosal lymphoid aggregates. Remaining colon random biopsies with inactive chronic colitis  . COLONOSCOPY WITH PROPOFOL N/A 08/30/2013   YCX:KGYJEH mucosa in the terminal ileum/COLITIS/ MILD PROCTITIS. Biopsies showed patchy chronic active colitis of the right colon and rectum, overall findings  most consistent with idiopathic inflammatory bowel disease.  Marland Kitchen COLONOSCOPY WITH PROPOFOL N/A 11/20/2015   Dr. 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 ESOPHAGOGASTRODUODENOSCOPY  11/20/2015   Dr. [name]: Normal exam, stomach biopsied and duodenal biopsy.. Benign biopsies.  . ESOPHAGOGASTRODUODENOSCOPY (EGD) WITH PROPOFOL N/A 12/07/2012   SLF:The mucosa of the esophagus appeared normal Non-erosive gastritis (inflammation) was  found in the gastric antrum; multiple biopsies The duodenal mucosa showed no abnormalities in the bulb and second portion of the duodenum  . ESOPHAGOGASTRODUODENOSCOPY (EGD) WITH PROPOFOL N/A 11/20/2015   Dr. [name]: normal with normal biopsies, negative H.pylori   . ESOPHAGOGASTRODUODENOSCOPY (EGD) WITH PROPOFOL N/A 04/18/2016   Procedure: ESOPHAGOGASTRODUODENOSCOPY (EGD) WITH PROPOFOL;  Surgeon: [name], MD;  Location: [facility];  Service: Endoscopy;  Laterality: N/A;  . FLEXIBLE SIGMOIDOSCOPY N/A 04/18/2016   Procedure: FLEXIBLE SIGMOIDOSCOPY;  Surgeon: [name], MD;  Location: [facility];  Service: Endoscopy;  Laterality: N/A;  . TONSILLECTOMY          Home Medications    Prior to Admission medications   Medication Sig Start Date End Date Taking? Authorizing Provider  albuterol (PROVENTIL HFA) 108 (90 Base) MCG/ACT inhaler Inhale into the lungs.    [provider]  albuterol (PROVENTIL) (2.5 MG/3ML) 0.083% nebulizer solution Inhale 3 mLs (2.5 mg total) into the lungs every 6 (six) hours as needed for wheezing or shortness of breath. 04/29/17   Raylene Everts, MD  CANASA 1000 MG suppository 1 suppository daily. 08/20/17   [provider]  cholecalciferol (VITAMIN D) 1000 units tablet Take 1 tablet (1,000 Units total) by mouth daily. START AFTER VITAMIN D 50,000 U TABLETS ARE COMPLETE 04/08/16   Fields, Marga Melnick, MD  cloNIDine HCl (KAPVAY) 0.1 MG TB12 ER tablet Take 0.1 mg by mouth 2 (two) times daily.    [provider]  cyclobenzaprine (FLEXERIL) 5 MG tablet Take 1 tablet (5 mg total) by mouth 2 (two) times daily as needed for muscle spasms. 10/01/17   Evalee Jefferson, PA-C  dicyclomine (BENTYL) 20 MG tablet Take 1 tablet (20 mg total) by mouth 2 (two) times daily. 08/12/17   Nima Kemppainen, Annie Main, MD  diphenoxylate-atropine (LOMOTIL) 2.5-0.025 MG tablet Take 1 tablet by mouth 4 (four) times daily as needed for diarrhea or loose stools. 08/31/17    Raylene Everts, MD  EPINEPHrine (EPIPEN 2-PAK) 0.3 mg/0.3 mL IJ SOAJ injection Inject 0.3 mg into the muscle once as needed (for severe allergic reaction).    [provider]  famotidine (PEPCID) 20 MG tablet Take 1 tablet (20 mg total) by mouth 2 (two) times daily. 02/21/17   Milton Ferguson, MD  loperamide (IMODIUM) 2 MG capsule Take 2 capsules (4 mg total) by mouth at bedtime as needed for diarrhea or loose stools. 12/25/17   Varney Biles, MD  loratadine (CLARITIN) 10 MG tablet Take 10 mg by mouth. 01/09/17   [provider]  montelukast (SINGULAIR) 10 MG tablet Take 10 mg by mouth. 01/09/17   [provider]  ondansetron (ZOFRAN ODT) 4 MG disintegrating tablet Take 1 tablet (4 mg total) by mouth every 8 (eight) hours as needed. 10/31/17   Varney Biles, MD  Oxcarbazepine (TRILEPTAL) 300 MG tablet Take one tablet by mouth twice daily/as directed per prescriber 01/06/17   [provider]  paliperidone (INVEGA) 3 MG 24 hr tablet Take 3 mg by mouth daily.    [provider]  phenol (CHLORASEPTIC) 1.4 % LIQD  Use as directed 1 spray in the mouth or throat as needed for throat irritation / pain. 10/22/17   Emeline General, PA-C  promethazine (PHENERGAN) 12.5 MG tablet TAKE 1 TABLET BY MOUTH EEVRY 6 HOURS AS NEEDED 08/27/17   [provider]  propranolol (INDERAL) 10 MG tablet Take 1 tablet by mouth 3 (three) times daily. 08/06/17   [provider]  Respiratory Therapy Supplies (NEBULIZER COMPRESSOR) KIT And supplies 04/29/17   Raylene Everts, MD  sertraline (ZOLOFT) 50 MG tablet Take 75 mg by mouth daily.    [provider]  sodium chloride (OCEAN) 0.65 % SOLN nasal spray Place 1 spray into both nostrils as needed for congestion. 10/22/17   Emeline General, PA-C  traZODone (DESYREL) 50 MG tablet Take 50 mg by mouth at bedtime.    [provider]    Family History Family History  Problem Relation Age of Onset  .  Asthma Mother   . Ulcers Mother   . Bipolar disorder Mother   . ADD / ADHD Father   . Diabetes Maternal Grandmother   . Hypertension Maternal Grandmother   . Sleep apnea Maternal Grandmother   . Heart disease Maternal Grandmother   . Diabetes Maternal Grandfather   . Hypertension Maternal Grandfather   . Sleep apnea Maternal Grandfather   . Heart disease Maternal Grandfather   . Cancer Maternal Grandfather        prostate  . Cancer Maternal Aunt        Leukemia  . Colon cancer Neg Hx   . Liver disease Neg Hx     Social History Social History   Tobacco Use  . Smoking status: Former Smoker    Packs/day: 0.00    Years: 2.00    Pack years: 0.00    Last attempt to quit: 10/06/2012    Years since quitting: 5.3  . Smokeless tobacco: Never Used  . Tobacco comment: Never smoked cigarettes  Substance Use Topics  . Alcohol use: No    Alcohol/week: 0.0 standard drinks    Comment: denies usage  . Drug use: No     Allergies   Nsaids; Pineapple; Remicade [infliximab]; Strawberry extract; Amoxicillin-pot clavulanate; Omeprazole; and Tomato   Review of Systems Review of Systems  Constitutional: Negative for activity change, appetite change and fever.  HENT: Positive for congestion, postnasal drip, rhinorrhea and sinus pressure. Negative for ear discharge, facial swelling, sore throat and trouble swallowing.   Eyes: Negative for visual disturbance.  Respiratory: Negative for cough, choking, chest tightness and shortness of breath.   Cardiovascular: Negative for chest pain.  Gastrointestinal: Negative for abdominal pain, nausea and vomiting.  Genitourinary: Negative for dysuria, hematuria, testicular pain and urgency.  Musculoskeletal: Negative for arthralgias and myalgias.  Neurological: Negative for dizziness, weakness and headaches.   all other systems are negative except as noted in the HPI and PMH.     Physical Exam Updated Vital Signs BP 116/82 (BP Location: Right Arm)    Pulse 88   Temp 98.6 F (37 C) (Oral)   Resp 20   Ht 5' 8.5" (1.74 m)   Wt 119.1 kg   SpO2 98%   BMI 39.34 kg/m   Physical Exam  Constitutional: He is oriented to person, place, and time. He appears well-developed and well-nourished. No distress.  HENT:  Head: Normocephalic and atraumatic.  Mouth/Throat: Oropharynx is clear and moist. No oropharyngeal exudate.  Nasal congestion Boggy nasal turbinates Frontal and maxillary sinus tenderness OP clear,  uvula midline  Eyes: Pupils are equal, round, and reactive to light. Conjunctivae and EOM are normal.  Neck: Normal range of motion. Neck supple.  No meningismus.  Cardiovascular: Normal rate, regular rhythm, normal heart sounds and intact distal pulses.  No murmur heard. Pulmonary/Chest: Effort normal and breath sounds normal. No respiratory distress. He has no wheezes.  Abdominal: Soft. There is no tenderness. There is no rebound and no guarding.  Musculoskeletal: Normal range of motion. He exhibits no edema or tenderness.  Neurological: He is alert and oriented to person, place, and time. No cranial nerve deficit. He exhibits normal muscle tone. Coordination normal.  No ataxia on finger to nose bilaterally. No pronator drift. 5/5 strength throughout. CN 2-12 intact.Equal grip strength. Sensation intact.   Skin: Skin is warm.  Psychiatric: He has a normal mood and affect. His behavior is normal.  Nursing note and vitals reviewed.    ED Treatments / Results  Labs (all labs ordered are listed, but only abnormal results are displayed) Labs Reviewed - No data to display  EKG None  Radiology Dg Chest 2 View  Result Date: 02/11/2018 CLINICAL DATA:  Cough, dyspnea and nasal congestion EXAM: CHEST - 2 VIEW COMPARISON:  None. FINDINGS: Low lung volumes with crowding of interstitial lung markings. Minimal left basilar atelectasis. No pulmonary consolidation nor edema. No effusion or pneumothorax. IMPRESSION: Low lung volumes without  active pulmonary disease. Electronically Signed   By: Ashley Royalty M.D.   On: 02/11/2018 00:34    Procedures Procedures (including critical care time)  Medications Ordered in ED Medications - No data to display   Initial Impression / Assessment and Plan / ED Course  I have reviewed the triage vital signs and the nursing notes.  Pertinent labs & imaging results that were available during my care of the patient were reviewed by me and considered in my medical decision making (see chart for details).    Upper airway nasal congestion with sinus tenderness.  No fever.  No productive cough.  Presentation consistent with viral URI.  He is not wheezing and moving good air.  No pneumonia on x-ray.  No evidence of asthma exacerbation.  Patient will be treated supportively with nasal decongestions, nasal steroids and anti-pyretics.  Oral hydration at home.  PCP follow-up.  Return precautions discussed  Final Clinical Impressions(s) / ED Diagnoses   Final diagnoses:  Viral upper respiratory tract infection    ED Discharge Orders    None       Shawanda Sievert, Annie Main, MD 02/11/18 [phone]

## 2018-02-11 MED ORDER — DM-GUAIFENESIN ER 30-600 MG PO TB12: 1.0000 | ORAL_TABLET | Freq: Two times a day (BID) | ORAL | 0 refills | Status: DC | PRN

## 2018-02-11 MED ORDER — FLUTICASONE PROPIONATE 50 MCG/ACT NA SUSP: 1.0000 | Freq: Every day | NASAL | 0 refills | Status: DC

## 2018-02-11 NOTE — Discharge Instructions (Addendum)
Use the nasal spray as prescribed but stop using this after 3 days.  Take the decongestions as prescribed.  Keep yourself hydrated.  Use ibuprofen or Tylenol as needed for fever and aches.  Return to the ED if you develop chest pain, shortness of breath or other concerns.

## 2018-03-08 ENCOUNTER — Other Ambulatory Visit: Payer: Self-pay

## 2018-03-08 ENCOUNTER — Emergency Department (HOSPITAL_COMMUNITY)
Admission: EM | Admit: 2018-03-08 | Discharge: 2018-03-08 | Disposition: A | Discharge status: Home / Self Care | Payer: Medicaid Other | Attending: Emergency Medicine | Admitting: Emergency Medicine

## 2018-03-08 ENCOUNTER — Encounter (HOSPITAL_COMMUNITY): Payer: Self-pay | Admitting: Emergency Medicine

## 2018-03-08 DIAGNOSIS — Z79899 Other long term (current) drug therapy: Secondary | ICD-10-CM | POA: Diagnosis not present

## 2018-03-08 DIAGNOSIS — J45909 Unspecified asthma, uncomplicated: Secondary | ICD-10-CM | POA: Insufficient documentation

## 2018-03-08 DIAGNOSIS — R195 Other fecal abnormalities: Secondary | ICD-10-CM | POA: Diagnosis not present

## 2018-03-08 DIAGNOSIS — Z87891 Personal history of nicotine dependence: Secondary | ICD-10-CM | POA: Insufficient documentation

## 2018-03-08 DIAGNOSIS — K51911 Ulcerative colitis, unspecified with rectal bleeding: Secondary | ICD-10-CM | POA: Diagnosis not present

## 2018-03-08 DIAGNOSIS — F79 Unspecified intellectual disabilities: Secondary | ICD-10-CM | POA: Insufficient documentation

## 2018-03-08 DIAGNOSIS — R1110 Vomiting, unspecified: Secondary | ICD-10-CM | POA: Diagnosis present

## 2018-03-08 LAB — LIPASE, BLOOD: Lipase: 29 U/L (ref 11–51)

## 2018-03-08 LAB — URINALYSIS, ROUTINE W REFLEX MICROSCOPIC
Bilirubin Urine: NEGATIVE
Glucose, UA: NEGATIVE mg/dL
Hgb urine dipstick: NEGATIVE
Ketones, ur: NEGATIVE mg/dL
Leukocytes, UA: NEGATIVE
Nitrite: NEGATIVE
Protein, ur: NEGATIVE mg/dL
Specific Gravity, Urine: 1.028 (ref 1.005–1.030)
pH: 5 (ref 5.0–8.0)

## 2018-03-08 LAB — COMPREHENSIVE METABOLIC PANEL
ALT: 13 U/L (ref 0–44)
AST: 16 U/L (ref 15–41)
Albumin: 3.8 g/dL (ref 3.5–5.0)
Alkaline Phosphatase: 66 U/L (ref 38–126)
Anion gap: 7 (ref 5–15)
BUN: 7 mg/dL (ref 6–20)
CO2: 22 mmol/L (ref 22–32)
Calcium: 9.1 mg/dL (ref 8.9–10.3)
Chloride: 109 mmol/L (ref 98–111)
Creatinine, Ser: 1.07 mg/dL (ref 0.61–1.24)
GFR calc Af Amer: >60 mL/min (ref >60)
GFR calc non Af Amer: >60 mL/min (ref >60)
Glucose, Bld: 98 mg/dL (ref 70–99)
Potassium: 3.9 mmol/L (ref 3.5–5.1)
Sodium: 138 mmol/L (ref 135–145)
Total Bilirubin: 0.7 mg/dL (ref 0.3–1.2)
Total Protein: 7.5 g/dL (ref 6.5–8.1)

## 2018-03-08 LAB — CBC WITH DIFFERENTIAL/PLATELET
Basophils Absolute: 0 10*3/uL (ref 0.0–0.1)
Basophils Relative: 0 %
Eosinophils Absolute: 0.3 10*3/uL (ref 0.0–0.7)
Eosinophils Relative: 4 %
HCT: 37.4 % — ABNORMAL LOW (ref 39.0–52.0)
Hemoglobin: 12.3 g/dL — ABNORMAL LOW (ref 13.0–17.0)
Lymphocytes Relative: 22 %
Lymphs Abs: 1.6 10*3/uL (ref 0.7–4.0)
MCH: 27.1 pg (ref 26.0–34.0)
MCHC: 32.9 g/dL (ref 30.0–36.0)
MCV: 82.4 fL (ref 78.0–100.0)
Monocytes Absolute: 1.2 10*3/uL — ABNORMAL HIGH (ref 0.1–1.0)
Monocytes Relative: 17 %
Neutro Abs: 4.1 10*3/uL (ref 1.7–7.7)
Neutrophils Relative %: 57 %
Platelets: 267 10*3/uL (ref 150–400)
RBC: 4.54 MIL/uL (ref 4.22–5.81)
RDW: 14.5 % (ref 11.5–15.5)
WBC: 7.2 10*3/uL (ref 4.0–10.5)

## 2018-03-08 MED ORDER — ONDANSETRON 8 MG PO TBDP
8.0000 mg | ORAL_TABLET | Freq: Once | ORAL | Status: AC
  Administered 2018-03-08: 8 mg via ORAL
  Filled 2018-03-08: qty 1

## 2018-03-08 MED ORDER — PREDNISONE 20 MG PO TABS: 40.0000 mg | ORAL_TABLET | Freq: Every day | ORAL | 0 refills | Status: DC

## 2018-03-08 MED ORDER — ONDANSETRON 4 MG PO TBDP: 4.0000 mg | ORAL_TABLET | Freq: Three times a day (TID) | ORAL | 0 refills | Status: DC | PRN

## 2018-03-08 NOTE — ED Provider Notes (Signed)
[facility] Provider Note   CSN: [id number] Arrival date & time: 03/08/18  1405     History   Chief Complaint Chief Complaint  Patient presents with  . Emesis  . Blood In Stools    HPI Wayne Avila is a 27 y.o. male.  HPI Level 5 caveat due to mental delay. Patient presented with blood in the stool.  Reportedly has had since yesterday.  Also some nausea and vomiting.  Some mild abdominal pain.  History of ulcerative colitis for which she gets managed at [facility].  Is due to have a recent appointment including tomorrow but has had family members die so they have not been able to go to the appointments.  No lightheadedness or dizziness.  Initially patient was by himself but later history came from her also. Past Medical History:  Diagnosis Date  . ADHD (attention deficit hyperactivity disorder)   . Anxiety   . Asthma   . Autoimmune hepatitis ([place])   . Bipolar 1 disorder ([place])   . C. difficile colitis 03/22/2016  . Clostridium difficile colitis   . Depression   . GERD (gastroesophageal reflux disease)   . HOH (hard of hearing)   . Mental developmental delay   . Seizures ([place])   . Ulcerative colitis [facility])     Patient Active Problem List   Diagnosis Date Noted  . Bipolar 1 disorder ([place]) 01/20/2017  . ADHD 01/09/2017  . Environmental allergies 01/09/2017  . Class 1 obesity due to excess calories with body mass index (BMI) of 34.0 to 34.9 in adult 01/09/2017  . Autoimmune hepatitis ([place]) 01/02/2017  . Asthma in adult, mild intermittent, uncomplicated 19/50/9326  . Anemia 12/31/2016  . Hyperammonemia ([place]) 12/31/2016  . Elevated LFTs 12/31/2016  . Abnormal thyroid function test 03/24/2016  . Ulcerative colitis ([place]) 12/29/2015  . Bipolar 1 disorder, manic, moderate ([place]) 11/09/2014  . Mental developmental delay 11/15/2012    Past Surgical History:  Procedure Laterality Date  . ADENOIDECTOMY    . BIOPSY N/A 12/07/2012   Procedure: GASTRIC BIOPSIES;   Surgeon: [name], MD;  Location: [facility] ORS;  Service: Endoscopy;  Laterality: N/A;  . BIOPSY N/A 08/30/2013   Procedure: BIOPSY;  Surgeon: [name], MD;  Location: [facility];  Service: Endoscopy;  Laterality: N/A;  right colon,transverse colon, left colon, rectal biopsies  . BIOPSY  11/20/2015   Procedure: BIOPSY;  Surgeon: [name], MD;  Location: [facility];  Service: Endoscopy;;  ileal, right colon biopsy, left colon, rectum  . BIOPSY  04/18/2016   Procedure: BIOPSY;  Surgeon: [name], MD;  Location: [facility];  Service: Endoscopy;;  left descending colon biopsies  . COLONOSCOPY  11/20/2015   Dr. [name]: Severe erythema, edema, ulcers from the anal verge to 20 cm above the anal verge without mucosal sparing, remaining colon and terminal ileum appeared normal. Biopsies from the rectum revealed fulminant active chronic colitis consistent with IBD. Pathology from terminal ileum revealed intramucosal lymphoid aggregates. Remaining colon random biopsies with inactive chronic colitis  . COLONOSCOPY WITH PROPOFOL N/A 08/30/2013   ZTI:WPYKDX mucosa in the terminal ileum/COLITIS/ MILD PROCTITIS. 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  Marland Kitchen COLONOSCOPY WITH PROPOFOL N/A 11/20/2015   Dr. 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 ESOPHAGOGASTRODUODENOSCOPY  11/20/2015   Dr. [name]: Normal exam, stomach biopsied and duodenal biopsy.Marland Kitchen  Benign biopsies.  . ESOPHAGOGASTRODUODENOSCOPY (EGD) WITH PROPOFOL N/A 12/07/2012   SLF:The mucosa of the esophagus appeared normal Non-erosive gastritis (inflammation) was found in the gastric antrum; multiple biopsies The duodenal mucosa showed no abnormalities in the bulb and second portion of the duodenum  . ESOPHAGOGASTRODUODENOSCOPY (EGD) WITH PROPOFOL N/A 11/20/2015   Dr. [name]: normal with normal  biopsies, negative H.pylori   . ESOPHAGOGASTRODUODENOSCOPY (EGD) WITH PROPOFOL N/A 04/18/2016   Procedure: ESOPHAGOGASTRODUODENOSCOPY (EGD) WITH PROPOFOL;  Surgeon: [name], MD;  Location[facility];  Service: Endoscopy;  Laterality: N/A;  . FLEXIBLE SIGMOIDOSCOPY N/A 04/18/2016   Procedure: FLEXIBLE SIGMOIDOSCOPY;  Surgeon: [name], MD;  Location: [facility];  Service: Endoscopy;  Laterality: N/A;  . TONSILLECTOMY          Home Medications    Prior to Admission medications   Medication Sig Start Date End Date Taking? Authorizing Provider  albuterol (PROVENTIL HFA) 108 (90 Base) MCG/ACT inhaler Inhale into the lungs.    [provider]  albuterol (PROVENTIL) (2.5 MG/3ML) 0.083% nebulizer solution Inhale 3 mLs (2.5 mg total) into the lungs every 6 (six) hours as needed for wheezing or shortness of breath. 04/29/17   Raylene Everts, MD  CANASA 1000 MG suppository 1 suppository daily. 08/20/17   [provider]  cholecalciferol (VITAMIN D) 1000 units tablet Take 1 tablet (1,000 Units total) by mouth daily. START AFTER VITAMIN D 50,000 U TABLETS ARE COMPLETE 04/08/16   Fields, Marga Melnick, MD  cloNIDine HCl (KAPVAY) 0.1 MG TB12 ER tablet Take 0.1 mg by mouth 2 (two) times daily.    [provider]  cyclobenzaprine (FLEXERIL) 5 MG tablet Take 1 tablet (5 mg total) by mouth 2 (two) times daily as needed for muscle spasms. 10/01/17   Evalee Jefferson, PA-C  dextromethorphan-guaiFENesin (MUCINEX DM) 30-600 MG 12hr tablet Take 1 tablet by mouth 2 (two) times daily as needed for cough. 02/11/18   Rancour, Annie Main, MD  dicyclomine (BENTYL) 20 MG tablet Take 1 tablet (20 mg total) by mouth 2 (two) times daily. 08/12/17   Rancour, Annie Main, MD  diphenoxylate-atropine (LOMOTIL) 2.5-0.025 MG tablet Take 1 tablet by mouth 4 (four) times daily as needed for diarrhea or loose stools. 08/31/17   Raylene Everts, MD  EPINEPHrine (EPIPEN 2-PAK) 0.3 mg/0.3 mL IJ SOAJ injection  Inject 0.3 mg into the muscle once as needed (for severe allergic reaction).    [provider]  famotidine (PEPCID) 20 MG tablet Take 1 tablet (20 mg total) by mouth 2 (two) times daily. 02/21/17   Milton Ferguson, MD  fluticasone (FLONASE) 50 MCG/ACT nasal spray Place 1 spray into both nostrils daily. 02/11/18   Rancour, Annie Main, MD  loperamide (IMODIUM) 2 MG capsule Take 2 capsules (4 mg total) by mouth at bedtime as needed for diarrhea or loose stools. 12/25/17   Varney Biles, MD  loratadine (CLARITIN) 10 MG tablet Take 10 mg by mouth. 01/09/17   [provider]  montelukast (SINGULAIR) 10 MG tablet Take 10 mg by mouth. 01/09/17   [provider]  ondansetron (ZOFRAN-ODT) 4 MG disintegrating tablet Take 1 tablet (4 mg total) by mouth every 8 (eight) hours as needed for nausea or vomiting. 03/08/18   Davonna Belling, MD  Oxcarbazepine (TRILEPTAL) 300 MG tablet Take one tablet by mouth twice daily/as directed per prescriber 01/06/17   [provider]  paliperidone (INVEGA) 3 MG 24 hr tablet Take 3 mg by mouth daily.    [provider]  phenol (CHLORASEPTIC) 1.4 % LIQD Use as directed 1 spray in the mouth or throat as needed for throat irritation / pain. 10/22/17   Emeline General, PA-C  predniSONE (DELTASONE) 20 MG tablet Take 2 tablets (40 mg total) by mouth daily. 03/08/18   Davonna Belling, MD  promethazine (PHENERGAN) 12.5 MG tablet TAKE 1 TABLET BY MOUTH EEVRY 6 HOURS AS NEEDED 08/27/17   [provider]  propranolol (INDERAL) 10 MG tablet Take 1 tablet by mouth 3 (three) times daily. 08/06/17   [provider]  Respiratory Therapy Supplies (NEBULIZER COMPRESSOR) KIT And supplies 04/29/17   Raylene Everts, MD  sertraline (ZOLOFT) 50 MG tablet Take 75 mg by mouth daily.    [provider]  sodium chloride (OCEAN) 0.65 % SOLN nasal spray Place 1 spray into both nostrils as needed for congestion. 10/22/17   Emeline General,  PA-C  traZODone (DESYREL) 50 MG tablet Take 50 mg by mouth at bedtime.    [provider]    Family History Family History  Problem Relation Age of Onset  . Asthma Mother   . Ulcers Mother   . Bipolar disorder Mother   . ADD / ADHD Father   . Diabetes Maternal Grandmother   . Hypertension Maternal Grandmother   . Sleep apnea Maternal Grandmother   . Heart disease Maternal Grandmother   . Diabetes Maternal Grandfather   . Hypertension Maternal Grandfather   . Sleep apnea Maternal Grandfather   . Heart disease Maternal Grandfather   . Cancer Maternal Grandfather        prostate  . Cancer Maternal Aunt        Leukemia  . Colon cancer Neg Hx   . Liver disease Neg Hx     Social History Social History   Tobacco Use  . Smoking status: Former Smoker    Packs/day: 0.00    Years: 2.00    Pack years: 0.00    Last attempt to quit: 10/06/2012    Years since quitting: 5.4  . Smokeless tobacco: Never Used  . Tobacco comment: Never smoked cigarettes  Substance Use Topics  . Alcohol use: No    Alcohol/week: 0.0 standard drinks    Comment: denies usage  . Drug use: No     Allergies   Nsaids; Pineapple; Remicade [infliximab]; Strawberry extract; Amoxicillin-pot clavulanate; Omeprazole; and Tomato   Review of Systems Review of Systems  Unable to perform ROS: Other  Constitutional: Negative for appetite change.  Gastrointestinal: Positive for abdominal pain.     Physical Exam Updated Vital Signs BP 125/76 (BP Location: Right Arm)   Pulse 78   Temp 98.3 F (36.8 C) (Oral)   Resp 15   SpO2 99%   Physical Exam  Constitutional: He appears well-developed.  HENT:  Head: Atraumatic.  Eyes: EOM are normal.  Cardiovascular: Normal rate.  Pulmonary/Chest: Effort normal.  Abdominal: There is no tenderness.  Musculoskeletal: He exhibits no edema.  Neurological: He is alert.  Patient is somewhat difficult to get history from.  Skin: Skin is warm. Capillary refill  takes less than 2 seconds.     ED Treatments / Results  Labs (all labs ordered are listed, but only abnormal results are displayed) Labs Reviewed  CBC WITH DIFFERENTIAL/PLATELET - Abnormal; Notable for the following components:      Result Value   Hemoglobin 12.3 (*)    HCT 37.4 (*)    Monocytes Absolute 1.2 (*)    All other  components within normal limits  LIPASE, BLOOD  COMPREHENSIVE METABOLIC PANEL  URINALYSIS, ROUTINE W REFLEX MICROSCOPIC    EKG None  Radiology No results found.  Procedures Procedures (including critical care time)  Medications Ordered in ED Medications  ondansetron (ZOFRAN-ODT) disintegrating tablet 8 mg (8 mg Oral Given 03/08/18 1638)     Initial Impression / Assessment and Plan / ED Course  I have reviewed the triage vital signs and the nursing notes.  Pertinent labs & imaging results that were available during my care of the patient were reviewed by me and considered in my medical decision making (see chart for details).     Patient with hematochezia.  Labs reassuring.  Benign exam.  Reassuring vitals.  Discussed with patient and his mother.  Will treat with antiemetics and steroids.  We will follow-up with his gastroenterologist.  Return for worsening symptoms.  Final Clinical Impressions(s) / ED Diagnoses   Final diagnoses:  Ulcerative colitis with rectal bleeding, unspecified location [facility])    ED Discharge Orders         Ordered    ondansetron (ZOFRAN-ODT) 4 MG disintegrating tablet  Every 8 hours PRN     03/08/18 1735    predniSONE (DELTASONE) 20 MG tablet  Daily     03/08/18 1735           Davonna Belling, MD 03/08/18 [phone]

## 2018-03-08 NOTE — ED Triage Notes (Signed)
Pt c/o blood in stool since yesterday. N/v x 3 since this am. Nad. Pt c/o rlq pain.

## 2018-03-08 NOTE — Discharge Instructions (Signed)
Follow-up with your gastroenterologist.  

## 2018-03-09 ENCOUNTER — Ambulatory Visit: Admit: 2018-03-09 | Discharge: 2018-03-10 | Payer: MEDICAID

## 2018-03-23 ENCOUNTER — Ambulatory Visit: Admit: 2018-03-23 | Discharge: 2018-03-24 | Payer: MEDICAID

## 2018-03-23 DIAGNOSIS — K51811 Other ulcerative colitis with rectal bleeding: Principal | ICD-10-CM

## 2018-03-23 DIAGNOSIS — R197 Diarrhea, unspecified: Secondary | ICD-10-CM

## 2018-03-23 DIAGNOSIS — Z9119 Patient's noncompliance with other medical treatment and regimen: Secondary | ICD-10-CM

## 2018-03-23 MED ORDER — PREDNISONE 10 MG TABLET
ORAL_TABLET | 0 refills | 0 days | Status: CP
Start: 2018-03-23 — End: 2018-05-18

## 2018-03-26 DIAGNOSIS — D509 Iron deficiency anemia, unspecified: Secondary | ICD-10-CM | POA: Insufficient documentation

## 2018-04-05 ENCOUNTER — Ambulatory Visit: Admit: 2018-04-05 | Discharge: 2018-04-06 | Payer: MEDICAID

## 2018-04-05 DIAGNOSIS — K51811 Other ulcerative colitis with rectal bleeding: Principal | ICD-10-CM

## 2018-04-07 ENCOUNTER — Ambulatory Visit: Admit: 2018-04-07 | Discharge: 2018-04-08 | Payer: MEDICAID

## 2018-04-07 DIAGNOSIS — D509 Iron deficiency anemia, unspecified: Principal | ICD-10-CM

## 2018-04-14 ENCOUNTER — Ambulatory Visit: Admit: 2018-04-14 | Discharge: 2018-04-15 | Payer: MEDICAID

## 2018-04-14 DIAGNOSIS — D509 Iron deficiency anemia, unspecified: Principal | ICD-10-CM

## 2018-04-21 ENCOUNTER — Ambulatory Visit: Admit: 2018-04-21 | Discharge: 2018-04-22 | Payer: MEDICAID

## 2018-04-21 DIAGNOSIS — K51811 Other ulcerative colitis with rectal bleeding: Principal | ICD-10-CM

## 2018-05-18 ENCOUNTER — Ambulatory Visit: Admit: 2018-05-18 | Discharge: 2018-05-19 | Payer: MEDICAID

## 2018-05-18 DIAGNOSIS — F819 Developmental disorder of scholastic skills, unspecified: Secondary | ICD-10-CM

## 2018-05-18 DIAGNOSIS — R197 Diarrhea, unspecified: Secondary | ICD-10-CM

## 2018-05-18 DIAGNOSIS — K51811 Other ulcerative colitis with rectal bleeding: Principal | ICD-10-CM

## 2018-05-18 DIAGNOSIS — D500 Iron deficiency anemia secondary to blood loss (chronic): Secondary | ICD-10-CM

## 2018-05-18 MED ORDER — PREDNISONE 10 MG TABLET
ORAL_TABLET | Freq: Every day | ORAL | 0 refills | 0.00000 days | Status: SS
Start: 2018-05-18 — End: 2018-06-19

## 2018-05-27 ENCOUNTER — Ambulatory Visit: Admit: 2018-05-27 | Discharge: 2018-05-28 | Payer: MEDICAID

## 2018-05-27 DIAGNOSIS — K51811 Other ulcerative colitis with rectal bleeding: Principal | ICD-10-CM

## 2018-06-17 ENCOUNTER — Ambulatory Visit: Admit: 2018-06-17 | Discharge: 2018-06-23 | Disposition: A | Discharge status: Home / Self Care | Payer: MEDICAID

## 2018-06-17 ENCOUNTER — Encounter
Admit: 2018-06-17 | Discharge: 2018-06-23 | Disposition: A | Discharge status: Home / Self Care | Payer: MEDICAID | Attending: Certified Registered"

## 2018-06-17 DIAGNOSIS — R112 Nausea with vomiting, unspecified: Principal | ICD-10-CM

## 2018-06-21 DIAGNOSIS — R112 Nausea with vomiting, unspecified: Principal | ICD-10-CM

## 2018-06-23 MED ORDER — FAMOTIDINE 20 MG TABLET
ORAL_TABLET | Freq: Two times a day (BID) | ORAL | 0 refills | 0.00000 days | Status: SS
Start: 2018-06-23 — End: 2018-10-01

## 2018-06-23 MED ORDER — PREDNISONE 10 MG TABLET
ORAL_TABLET | 0 refills | 0 days | Status: CP
Start: 2018-06-23 — End: 2018-08-01

## 2018-06-29 ENCOUNTER — Ambulatory Visit: Admit: 2018-06-29 | Discharge: 2018-06-30 | Payer: MEDICAID

## 2018-06-29 DIAGNOSIS — F819 Developmental disorder of scholastic skills, unspecified: Secondary | ICD-10-CM

## 2018-06-29 DIAGNOSIS — D500 Iron deficiency anemia secondary to blood loss (chronic): Secondary | ICD-10-CM

## 2018-06-29 DIAGNOSIS — K51811 Other ulcerative colitis with rectal bleeding: Principal | ICD-10-CM

## 2018-06-29 LAB — ALT (SGPT): Alanine aminotransferase:CCnc:Pt:Ser/Plas:Qn:: 21

## 2018-06-29 LAB — CBC W/ AUTO DIFF
BASOPHILS ABSOLUTE COUNT: 0 10*9/L (ref 0.0–0.1)
EOSINOPHILS ABSOLUTE COUNT: 0 10*9/L (ref 0.0–0.4)
EOSINOPHILS RELATIVE PERCENT: 0.1 %
HEMATOCRIT: 38.1 % — ABNORMAL LOW (ref 41.0–53.0)
HEMOGLOBIN: 12.2 g/dL — ABNORMAL LOW (ref 13.5–17.5)
LARGE UNSTAINED CELLS: 1 % (ref 0–4)
LYMPHOCYTES ABSOLUTE COUNT: 1.2 10*9/L — ABNORMAL LOW (ref 1.5–5.0)
LYMPHOCYTES RELATIVE PERCENT: 7 %
MEAN CORPUSCULAR HEMOGLOBIN: 28.1 pg (ref 26.0–34.0)
MEAN CORPUSCULAR VOLUME: 87.5 fL (ref 80.0–100.0)
MEAN PLATELET VOLUME: 8 fL (ref 7.0–10.0)
MONOCYTES ABSOLUTE COUNT: 0.7 10*9/L (ref 0.2–0.8)
MONOCYTES RELATIVE PERCENT: 4.5 %
NEUTROPHILS ABSOLUTE COUNT: 14.3 10*9/L — ABNORMAL HIGH (ref 2.0–7.5)
NEUTROPHILS RELATIVE PERCENT: 87.5 %
PLATELET COUNT: 390 10*9/L (ref 150–440)
RED BLOOD CELL COUNT: 4.35 10*12/L — ABNORMAL LOW (ref 4.50–5.90)
RED CELL DISTRIBUTION WIDTH: 17.2 % — ABNORMAL HIGH (ref 12.0–15.0)
WBC ADJUSTED: 16.4 10*9/L — ABNORMAL HIGH (ref 4.5–11.0)

## 2018-06-29 LAB — COMPREHENSIVE METABOLIC PANEL
ALBUMIN: 3.9 g/dL (ref 3.5–5.0)
ALKALINE PHOSPHATASE: 82 U/L (ref 38–126)
ALT (SGPT): 21 U/L (ref <50)
ANION GAP: 13 mmol/L (ref 7–15)
AST (SGOT): 16 U/L — ABNORMAL LOW (ref 19–55)
BILIRUBIN TOTAL: 0.2 mg/dL (ref 0.0–1.2)
BLOOD UREA NITROGEN: 10 mg/dL (ref 7–21)
BUN / CREAT RATIO: 14
CALCIUM: 9.9 mg/dL (ref 8.5–10.2)
CHLORIDE: 104 mmol/L (ref 98–107)
CO2: 24 mmol/L (ref 22.0–30.0)
CREATININE: 0.69 mg/dL — ABNORMAL LOW (ref 0.70–1.30)
EGFR CKD-EPI AA MALE: >90 mL/min/{1.73_m2} (ref >=60)
EGFR CKD-EPI NON-AA MALE: >90 mL/min/{1.73_m2} (ref >=60)
GLUCOSE RANDOM: 92 mg/dL (ref 70–179)
POTASSIUM: 4.3 mmol/L (ref 3.5–5.0)
PROTEIN TOTAL: 7.2 g/dL (ref 6.5–8.3)
SODIUM: 141 mmol/L (ref 135–145)

## 2018-06-29 LAB — LIPID PANEL
CHOLESTEROL: 169 mg/dL (ref 100–199)
HDL CHOLESTEROL: 54 mg/dL (ref 40–59)
LDL CHOLESTEROL CALCULATED: 83 mg/dL (ref 60–99)
NON-HDL CHOLESTEROL: 115 mg/dL
TRIGLYCERIDES: 160 mg/dL — ABNORMAL HIGH (ref 1–149)
VLDL CHOLESTEROL CAL: 32 mg/dL (ref 9–40)

## 2018-06-29 LAB — ERYTHROCYTE SEDIMENTATION RATE: Lab: 15

## 2018-06-29 LAB — RED CELL DISTRIBUTION WIDTH: Lab: 17.2 — ABNORMAL HIGH

## 2018-06-29 LAB — CHOLESTEROL/HDL RATIO SCREEN: Lab: 3.1

## 2018-06-29 LAB — C-REACTIVE PROTEIN: C reactive protein:MCnc:Pt:Ser/Plas:Qn:: <5

## 2018-06-29 MED ORDER — TOFACITINIB 10 MG TABLET
ORAL_TABLET | Freq: Two times a day (BID) | ORAL | 0 refills | 0.00000 days | Status: CP
Start: 2018-06-29 — End: 2018-07-23
  Filled 2018-07-05: qty 60, 30d supply, fill #0

## 2018-06-29 NOTE — Unmapped (Signed)
INSTRUCTIONS:     1.  Lab work today  2.  We will work with your insurance to get tofacitinib [name]) approved.  3. Prednisone taper  - prednisone 40mg  daily for 1 more week, then 30mg  daily for 1 week, then 20mg  daily for 1 week, then 10mg  daily for 1 week, then 5mg  daily for 1 week, then off.       Zetta Bills, MD  Assistant Professor of Medicine  Division of Gastroenterology & Hepatology  [facility]    Clinical Nurse Contact:  [name], RN  Ph#  [phone]  Fax#  [phone]    Office Number Rubbie Battiest):  Ph# [phone]  Fax # [phone]    For clinic appointments, please call, [phone], option 1.  For questions regarding radiology appointments, or to schedule, [phone].  For questions regarding scheduling GI procedures (e.g,. Colonoscopy), please call, [phone], option 2.    For educational material and resources:  [email or web address]

## 2018-06-29 NOTE — Unmapped (Signed)
[facility]   FOLLOWUP NOTE - INFLAMMATORY BOWEL DISEASE  06/29/2018    Demographics:  Chuck Hint is a 28 y.o. year old male    Diagnosis:  Ulcerative Colitis  Disease onset (yr): 52  Age at onset:   85-40 yr old (A2)  Location:  Unknown  Behavior:  Colitis  Current Tight Control Scenario:   Maintenance = Biologic          HPI / NOTE :     HPI:    Interval Events:   1. Admitted to hosptal 06/17/18 - 06/23/18 with colitis flare. Flex sig w/ Mayo 3 dz.  Improved with IV steroids, discharged home on PO prednisone taper and plan to initiate long term therapy after seeing me in clinic. I reviewed all hospitalization records which are incorporated into IBD hx below.   2.  He is doing great since discharge. He is on prednisone 40mg  PO QD. No diarrhea or blood in stool since discharge. Last BM was on 06/27/18 and was formed. No abdominal pain, urgency, incontinence. No nocturnal stools.       Abdominal pain (0-10):  none  BM a day:  0-1  Consistency: formed  % of stools have blood: 0  Nocturnal BM: none  Urgency: no  Weight change over last 6 mo: stable x 1 month  Smoking: no  NSAIDS: avoids    Review of Systems:   Review of systems positive for: negative except as above.   Otherwise, the balance of 10 systems is negative.          IBD HISTORY:     Year of disease onset:  2015    Brief IBD Disease Course:    - 2015 - crampy abdominal pain, diarrhea, hematochezia.  Had colonoscopy and dx with UC. Rx budesonide and mesalamine which did not work.  No medications for 1-2 years after that.   - 2017 - Started on Infliximab with improvement.  October 2017 - developed C.diff but not able to be cleared until Feb 2018.  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 Pt given prednisone taper and started on Vedolizumab 03/2017. Had 2 brief flares after starting Vedolizumab (Jan 2019 - brief prednisone burst).    - 2019 - gap in Entyvio for ~5 months.  Re-load + prednisone in Oct 2019 but ongoing symptoms.  Admitted to [facility] Jan 2020 with severe colitis - got IV Steroids with initial improvement.     Endoscopy:      - Colonoscopy 02/12/17 - poor prep.  Mild inflammation from rectum to descending colon (Mayo 1).  Remaining colon and terminal ileum normal.     PATH = mildly active chronic colitis, no granulomas.    - Colonoscopy 08/19/17 - Normal ileum. Friable mucosa in sigmoid, patchy inflammation in Rectum only (Mayo 1).  Improved from prior colonoscopy 02/2017.   - Sigmoidoscopy 06/21/18 - Severe (Mayo 3) colitis from anus to transverse colon, worse than prior.     Imaging:    - CT abd pel w/ IV contrast 06/17/18: mild wall thickening from transverse to rectum; mild fatty infiltration of sigmoid and rectal wall.    Prior IBD medications (type, dose, duration, response):  x 5-ASAs - briefly after dx- no response  x Oral corticosteroids - budesonide no response  x Intravenous corticosteroids - Jan 2020  ? Antibiotics  ? Thiopurines  ? Methotrexate  x Anti-TNF therapies - Infliximab 2017-2018 - good response but  developed suspected DILI so stopped  x Anti-Integrin therapies - Vedolizumab 03/2017  ? Anti-Interleukin therapies  ? Cyclosporine  ? Clinical trial medication  ? Other (Please specify):    IBD health maintenance:  Influenza vaccine: 03/2018  Pneumonia vaccine: 23 valent in 2017; 13 valent in 2019  Hepatitis B: 1994-1995  TB testing: Quant gold Oct 2019  Chickenpox/Shingles history: had chickenpox as a child  Bone denistometry:   Derm appointment:  Last small bowel imaging: Jan 2020  Last colonoscopy: March 2019    Extraintestinal manifestations:   -joint pains affecting: n  -eye: n  -skin: n  -oral ulcers :  n  -blood clots: n  -PSC: n  -other: n          Past Medical History:   Past medical history:   Past Medical History:   Diagnosis Date   ??? ADHD    ??? Asthma    ??? Bipolar 1 disorder (CMS-HCC)    ??? Depression    ??? GERD (gastroesophageal reflux disease)    ??? HOH (hard of hearing)    ??? Hypertension    ??? Mental developmental delay    ??? Seizures (CMS-HCC)     last seizures 2016   ??? Ulcerative colitis (CMS-HCC)      Past surgical history:   Past Surgical History:   Procedure Laterality Date   ??? ADENOIDECTOMY     ??? PR COLONOSCOPY FLX DX W/COLLJ SPEC WHEN PFRMD N/A 08/19/2017    Procedure: COLONOSCOPY, FLEXIBLE, PROXIMAL TO SPLENIC FLEXURE; DIAGNOSTIC, W/WO COLLECTION SPECIMEN BY BRUSH OR WASH;  Surgeon: Bluford Kaufmann, MD;  [facility]: [facility];  Service: Gastroenterology   ??? PR COLONOSCOPY W/BIOPSY SINGLE/MULTIPLE N/A 02/12/2017    Procedure: COLONOSCOPY, FLEXIBLE, PROXIMAL TO SPLENIC FLEXURE; WITH BIOPSY, SINGLE OR MULTIPLE;  Surgeon: Rona Ravens, MD;  Location: [facility];  Service: Gastroenterology   ??? PR SIGMOIDOSCOPY FLX DX W/COLLJ SPEC BR/WA IF PFRMD N/A 06/21/2018    Procedure: SIGMOIDOSCOPY, FLEXIBLE; DIAGNOSTIC, WITH OR WITHOUT COLLECTION OF SPECIMEN(S) BY BRUSHING OR WASHING;  Surgeon: Cletis Athens, MD;  [facility]: [facility];  Service: Gastroenterology     Family history:   Family History   Problem Relation Age of Onset   ??? Cancer Paternal Uncle         unknown, but has an ostomy   ??? Crohn's disease Paternal Aunt    ??? Cancer Maternal Uncle         colon   ??? Ulcerative colitis Neg Hx      Social history:   Social History     Socioeconomic History   ??? Marital status: Legally Separated     Spouse name: None   ??? Number of children: None   ??? Years of education: None   ??? Highest education level: None   Occupational History   ??? None   Social Needs   ??? Financial resource strain: None   ??? Food insecurity:     Worry: None     Inability: None   ??? Transportation needs:     Medical: None     Non-medical: None   Tobacco Use   ??? Smoking status: Never Smoker   ??? Smokeless tobacco: Never Used   Substance and Sexual Activity   ??? Alcohol use: No   ??? Drug use: No   ??? Sexual activity: None   Lifestyle   ??? Physical activity: Days per week: None     Minutes  per session: None   ??? Stress: None   Relationships   ??? Social connections:     Talks on phone: None     Gets together: None     Attends religious service: None     Active member of club or organization: None     Attends meetings of clubs or organizations: None     Relationship status: None   Other Topics Concern   ??? None   Social History Narrative   ??? None             Allergies:     Allergies   Allergen Reactions   ??? Infliximab Other (See Comments)     AUTOIMMUNE HEPATITIS     ??? Pineapple Swelling     Reaction:  Lip swelling   ??? Strawberry Swelling     Reaction:  Lip swelling   ??? Tolmetin Other (See Comments)     Should not take due to GI condition     ??? Tomato Swelling and Rash   ??? Nsaids (Non-Steroidal Anti-Inflammatory Drug) Other (See Comments)     Should not take 2/2 GI condition   ??? Amoxicillin-Pot Clavulanate Nausea And Vomiting and Nausea Only     GI intolerance   ??? Omeprazole Nausea And Vomiting             Medications:     Current Outpatient Medications   Medication Sig Dispense Refill   ??? albuterol (PROVENTIL HFA;VENTOLIN HFA) 90 mcg/actuation inhaler Inhale 2 puffs every four (4) hours as needed.      ??? albuterol 2.5 mg /3 mL (0.083 %) nebulizer solution Inhale 2.5 mg every four (4) hours as needed.      ??? cholecalciferol, vitamin D3, (CHOLECALCIFEROL) 1,000 unit tablet Take 1,000 Units by mouth daily.      ??? EPINEPHrine (EPIPEN) 0.3 mg/0.3 mL injection Inject 0.3 mg into the muscle.     ??? famotidine (PEPCID) 20 MG tablet Take 1 tablet (20 mg total) by mouth Two (2) times a day. 60 tablet 0   ??? loratadine (CLARITIN) 10 mg tablet Take 10 mg by mouth daily.      ??? montelukast (SINGULAIR) 10 mg tablet Take 10 mg by mouth nightly.      ??? ondansetron (ZOFRAN) 4 MG tablet Take 4 mg by mouth every eight (8) hours as needed for nausea.     ??? paliperidone (INVEGA) 3 MG 24 hr tablet Take 3 mg by mouth daily at 10am.     ??? predniSONE (DELTASONE) 10 MG tablet 40mg  qd x 14days (1/27end), then 35mg /d x 5d, then 30mg /d x 5d, then 25mg /d x 5d, then 20mg /d x 5d, then 15mg /d x 5d, then 10mg /d x 5, then 5mg /d x 5, then off. 153 tablet 0   ??? propranolol (INDERAL) 10 MG tablet TK 1 T PO TID  2   ??? sertraline (ZOLOFT) 100 MG tablet Take 75 mg by mouth daily.      ??? traZODone (DESYREL) 50 MG tablet Take 50 mg by mouth nightly.     ??? tofacitinib 10 mg Tab Take 1 tablet (10 mg) by mouth Two (2) times a day. 60 tablet 0   ??? tretinoin (RETIN-A) 0.025 % cream Apply 1 application topically nightly.       No current facility-administered medications for this visit.              Physical Exam:   BP 125/72  - Pulse 80  - Temp 36.7 ??C (98 ??  F)  - Wt (!) 116.1 kg (256 lb)  - BMI 42.60 kg/m??     GEN: overweight young male in no apparent distress, appears comfortable on exam  HEENT: PEERL, OP clear with no erythema, lesions, exudate, mucous membranes moist;  NECK: Supple, no lymphadenopathy  LUNGS: CTAB, no wheezes, rales, or rhonchi  CV: S1/S2, RRR, no murmurs  ABD: Obese, soft, NT, nondistended, normoactive bowel sounds, no appreciable organomegaly  Extremities: no cyanosis, clubbing or edema, normal gait  Psych: affect appropriate, A&O x3          Labs, Data & Indices:     Lab Review:   Lab Results   Component Value Date    WBC 16.4 (H) 06/29/2018    RBC 4.35 (L) 06/29/2018    HGB 12.2 (L) 06/29/2018     Lab Results   Component Value Date    AST 16 (L) 06/29/2018    ALT 21 06/29/2018    BUN 10 06/29/2018    Creatinine 0.69 (L) 06/29/2018    CO2 24.0 06/29/2018    Albumin 3.9 06/29/2018    Calcium 9.9 06/29/2018     No results found for: TSH   ..........................................................................................................................................Marland Kitchen  Modified Mayo Score for Ulcerative Colitis  Stool Frequency:  0 = Normal  Rectal bleeding:  0 = None  Physicians Global Assessment:  0 = Normal  Score:  0 though just finished IV steroids  Remission <2 ...........................................................................................................................................Marland Kitchen   Diagnosis ICD-10-CM Associated Orders   1. Other ulcerative colitis with rectal bleeding (CMS-HCC) K51.811 C-reactive protein     Sedimentation Rate     CBC w/ Differential     Comprehensive Metabolic Panel     Lipid panel, non fasting     Lipid panel, non fasting     Comprehensive Metabolic Panel     CBC w/ Differential     Sedimentation Rate     C-reactive protein     CBC w/ Differential   2. Cognitive developmental delay F81.9    3. Iron deficiency anemia due to chronic blood loss D50.0            Assessment & Recommendations:   Disease state: Active UC    Chuck Hint is a 28 y.o. male with a hx notable for ADHD, Seizure disorder, Developmental delay, Bipolar disorder, hard of hearing and GERD.  He has a hx of ulcerative colitis since 2015, previously with lack of response with mesalamine, prior exposure to Remicade (good response but stopped due to suspected DILI) and now on Vedolizumab since 03/2017.  Colonoscopy March 2019 showed partial endoscopic improvement on Vedolizumab.  Unfortunately he had a long gap in [facility] May - Oct 2019 due to transportation issues. He did not improve after Entyvio was reloaded and did not respond to several courses of PO prednisone. He required hospitalization at [facility] 06/17/18-06/23/18. During that hospitalization he had a flex sig showing severe (Mayo 3) colitis. Testing for infectious pathogens was negative. He improved with IV steroids and is doing well today on PO steroids.  We have not been able to regain control of his colitis despite reloading entyvio and multiple prednisone courses.  Hence, I think we need to change therapy.  Today we discussed 2 options with the patient and his mother:  - Humira; counseled that he had DILI possibly due to Remicade (Another anti-TNF) in the past and would be at risk for recurrent DILI.   [name]. Discussed that risks include immunosuppression, hyperlipidemia, and blood clots. Discussed that  this is an oral drug so would not require shots or infusions. Discussed that this would be our first choice. He and his mother opted for [place].     Will check labs below. Will taper prednisone as below. Will initiate process to get [name] approved    PLAN:  1.  CBC, CMP, ESR, CRP, lipid panel  2. Start [name] 10mg  BID x ~8 weeks, then decrease to 5mg  BID if he responds well  3. Complete prednisone 40mg  PO QD x 2 weeks, then 30mg  PO QD x 7 days, then 20mg  PO QD x 7 days, then 10mg  PO QD x 7 days, then 5mg  PO QD x 7 days, then off  4. RTC in 2 months    Janeal Holmes, MD  GI fellow    Patient seen and examined with Dr. Raphael Gibney.  --------------------------------------------  Author: Zetta Bills 06/29/2018 5:01 PM    Zetta Bills, MD  Assistant Professor of Medicine  Division of Gastroenterology & Hepatology  [facility]

## 2018-06-30 NOTE — Unmapped (Signed)
Per test claim for Austin [name] at the [facility], patient needs Medication Assistance Program for Prior Authorization.

## 2018-07-01 LAB — 6-METHYLMERCAPTOPURINE: Thiopurine methyltransferase:CCnc:Pt:RBC:Qn:Production of 6-Methylmercaptopurine: 2.3 — ABNORMAL LOW

## 2018-07-01 LAB — THIOPURINE METHYLTRANFERASE: 6-METHYLMERCAPTOPURINE RIBOSIDE: 4.25 nmol/mL/h — ABNORMAL LOW

## 2018-07-01 NOTE — Unmapped (Signed)
[facility] Special[facility] Medication Referral: PA Approved      Medication (Brand/Generic): [name]    Final [employer] completed with resulted information below:    Patient ABLE to fill at [facility]  Insurance Company:  [facility] Tracks  Anticipated Copay: $3  Is anticipated copay with a copay card or grant? No    Does patient's insurance plan only allow a 15 day supply for the first 6 fills in the Split Fill Program? No  If yes, inform patient they can request to dis-enroll from the [facility] by calling the patient help desk at N/A.      If the copay is under the $25 defined limit, per policy there will be no further investigation of need for financial assistance at this time unless patient requests. This referral has been communicated to the provider and handed off to the [facility] team for further processing and filling of prescribed medication.   ______________________________________________________________________  Please utilize this referral for viewing purposes as it will serve as the central location for all relevant documentation and updates.

## 2018-07-02 NOTE — Unmapped (Signed)
[facility]   Patient Onboarding/Medication Counseling    Mr.Austin [name] is a 28 y.o. male with ulcerative colitis who I am counseling today on initiation of therapy.    Medication: [name] 10mg     Verified patient's date of birth / HIPAA.      Education Provided: ?    Dose/Administration discussed: Take 1 tablet by mouth 2 times daily. This medication should be taken  without regard to food.  Stressed the importance of taking medication as prescribed and to contact provider if that changes at any time.  Discussed missed dose instructions.    Storage requirements: this medicine should be stored at room temperature.     Side effects / precautions discussed: Discussed common side effects, including but not limited to diarrhea, minor cold-like symptoms, etc. If patient experiences signs of allergic reaction, s/sx of infection, skin changes, shortness of breath or change in heartbeat, etc., they need to call the doctor.  Patient will receive a drug information handout with shipment.    Handling precautions / disposal reviewed:  n/a.    Drug Interactions: other medications reviewed and up to date in Epic.  No drug interactions identified.    Comorbidities/Allergies: reviewed and up to date in Epic.    Verified therapy is appropriate and should continue      Delivery Information    Medication Assistance provided: Prior Authorization    Anticipated copay of $3 reviewed with patient. Verified delivery address in Epic.    Scheduled delivery date: 07/06/2018  Medication will be delivered via UPS to the home address in [facility].  This shipment will not require a signature.      Explained the services we provide at [facility] and that each month we would call to set up refills.  Stressed importance of returning phone calls so that we could ensure they receive their medications in time each month.  Informed patient that we should be setting up refills 7-10 days prior to when they will run out of medication.  Informed patient that welcome packet will be sent.      Patient verbalized understanding of the above information as well as how to contact the pharmacy at [phone] option 4 with any questions/concerns.  The pharmacy is open Monday through Friday 8:30am-4:30pm.  A pharmacist is available 24/7 via pager to answer any clinical questions they may have.        Patient Specific Needs      ? Patient has no physical, cognitive, or cultural barriers.    ? Patient prefers to have medications discussed with  Patient     ? Patient is able to read and understand education materials at a high school level or above.    ? Patient's primary language is  English           Lupita Shutter, PharmD  [facility] Specialty Pharmacist

## 2018-07-05 MED FILL — XELJANZ 10 MG TABLET: 30 days supply | Qty: 60 | Fill #0 | Status: AC

## 2018-07-23 NOTE — Unmapped (Signed)
[facility] Refill and Clinical Coordination Note  Medication(s): [name] 10mg     Austin [name], DOB: Aug 06, 1990  Phone: [phone] (home) , Alternate phone contact: N/A  Shipping address: [address]  [place] [address]  Phone or address changes today?: No  All above HIPAA information verified.  Insurance changes? No    Completed refill and clinical call assessment today to schedule patient's medication shipment from the [facility] [phone]).      MEDICATION RECONCILIATION    Confirmed the medication and dosage are correct and have not changed: Yes, regimen is correct and unchanged.    Were there any changes to your medication(s) in the past month:  Yes. Austin [name] reports starting the following medications: prednisone dosepack and a 7 day course of azithromycin.  He is almost done with both.    ADHERENCE    Is this medicine transplant or covered by Medicare Part B? No.    Not Applicable    Did you miss any doses in the past 4 weeks? Yes.  Austin [name] reports missing 2 days of medication therapy in the last 4 weeks.  Austin [name] reports forgetting as the cause of their non-adherance.  Adherence counseling provided? Yes, discussed the following ways to help with adherence: suggested using a cell phone to add morning and evening reminders.  Currently pt's mother is his reminder system.Marland Kitchen     SIDE EFFECT MANAGEMENT    Are you tolerating your medication?:  Austin [name] reports side effects of fatigue or feeling a little tired.  Side effect management discussed: told mother he could try and take naps/rests through out the day as needed.  Although this is not a typical side effect of this medication.      Therapy is appropriate and should be continued.    Evidence of clinical benefit: See Epic note from 06/29/18      FINANCIAL/SHIPPING    Delivery Scheduled: Yes, Expected medication delivery date: 08/03/18     Medication will be delivered via UPS to the home address in [facility].    Additional medications refilled: No additional medications/refills needed at this time.    The patient will receive a drug information handout for each medication shipped and additional FDA Medication Guides as required.      Austin [name] did not have any additional questions at this time.    Delivery address confirmed in Epic.     We will follow up with patient monthly for standard refill processing and delivery.      Thank you,  Breck Coons [facility] Specialty Pharmacist

## 2018-07-24 MED ORDER — TOFACITINIB 10 MG TABLET
ORAL_TABLET | Freq: Two times a day (BID) | ORAL | 0 refills | 0 days | Status: CP
Start: 2018-07-24 — End: 2018-08-24
  Filled 2018-08-02: qty 60, 30d supply, fill #0

## 2018-07-26 ENCOUNTER — Ambulatory Visit: Admit: 2018-07-26 | Discharge: 2018-07-27 | Payer: MEDICAID

## 2018-08-01 ENCOUNTER — Ambulatory Visit: Admit: 2018-08-01 | Discharge: 2018-08-02 | Disposition: A | Discharge status: Home / Self Care | Payer: MEDICAID

## 2018-08-01 LAB — COMPREHENSIVE METABOLIC PANEL
ALBUMIN: 3 g/dL — ABNORMAL LOW (ref 3.5–5.0)
ALKALINE PHOSPHATASE: 39 U/L (ref 38–126)
ALT (SGPT): 13 U/L (ref <50)
ANION GAP: 9 mmol/L (ref 7–15)
AST (SGOT): 27 U/L (ref 19–55)
BILIRUBIN TOTAL: 0.3 mg/dL (ref 0.0–1.2)
BLOOD UREA NITROGEN: <2 mg/dL — ABNORMAL LOW (ref 7–21)
CALCIUM: 9.1 mg/dL (ref 8.5–10.2)
CHLORIDE: 106 mmol/L (ref 98–107)
CO2: 25 mmol/L (ref 22.0–30.0)
CREATININE: 0.75 mg/dL (ref 0.70–1.30)
EGFR CKD-EPI AA MALE: >90 mL/min/{1.73_m2} (ref >=60)
EGFR CKD-EPI NON-AA MALE: >90 mL/min/{1.73_m2} (ref >=60)
GLUCOSE RANDOM: 92 mg/dL (ref 70–179)
PROTEIN TOTAL: 6.3 g/dL — ABNORMAL LOW (ref 6.5–8.3)

## 2018-08-01 LAB — CBC W/ AUTO DIFF
BASOPHILS ABSOLUTE COUNT: 0 10*9/L (ref 0.0–0.1)
EOSINOPHILS ABSOLUTE COUNT: 0.3 10*9/L (ref 0.0–0.4)
EOSINOPHILS RELATIVE PERCENT: 3.1 %
HEMATOCRIT: 33.2 % — ABNORMAL LOW (ref 41.0–53.0)
HEMOGLOBIN: 10.4 g/dL — ABNORMAL LOW (ref 13.5–17.5)
LARGE UNSTAINED CELLS: 2 % (ref 0–4)
LYMPHOCYTES ABSOLUTE COUNT: 2.4 10*9/L (ref 1.5–5.0)
MEAN CORPUSCULAR HEMOGLOBIN CONC: 31.5 g/dL (ref 31.0–37.0)
MEAN CORPUSCULAR HEMOGLOBIN: 27.1 pg (ref 26.0–34.0)
MEAN CORPUSCULAR VOLUME: 86.1 fL (ref 80.0–100.0)
MONOCYTES ABSOLUTE COUNT: 0.5 10*9/L (ref 0.2–0.8)
MONOCYTES RELATIVE PERCENT: 5.6 %
NEUTROPHILS ABSOLUTE COUNT: 4.8 10*9/L (ref 2.0–7.5)
NEUTROPHILS RELATIVE PERCENT: 59.4 %
PLATELET COUNT: 504 10*9/L — ABNORMAL HIGH (ref 150–440)
RED BLOOD CELL COUNT: 3.85 10*12/L — ABNORMAL LOW (ref 4.50–5.90)
RED CELL DISTRIBUTION WIDTH: 15.9 % — ABNORMAL HIGH (ref 12.0–15.0)
WBC ADJUSTED: 8 10*9/L (ref 4.5–11.0)

## 2018-08-01 LAB — MEAN PLATELET VOLUME: Lab: 8

## 2018-08-01 LAB — MAGNESIUM: Magnesium:MCnc:Pt:Ser/Plas:Qn:: 1.7

## 2018-08-01 LAB — C-REACTIVE PROTEIN: C reactive protein:MCnc:Pt:Ser/Plas:Qn:: 45 — ABNORMAL HIGH

## 2018-08-01 LAB — PROTEIN TOTAL: Protein:MCnc:Pt:Ser/Plas:Qn:: 6.3 — ABNORMAL LOW

## 2018-08-01 LAB — ERYTHROCYTE SEDIMENTATION RATE: Lab: 64 — ABNORMAL HIGH

## 2018-08-01 MED ORDER — PREDNISONE 10 MG TABLET
ORAL_TABLET | Freq: Every day | ORAL | 0 refills | 0.00000 days | Status: CP
Start: 2018-08-01 — End: 2018-08-03

## 2018-08-01 NOTE — Unmapped (Signed)
Patient paged GI fellow to discuss possible UC flare. Hx of Mayo 3 colitis.  Was recently admitted Jan 2020 for flare and placed on Pred taper. Austin [name] was started at last clinic visit with [name]. He states that he is taking the xeljanz and completed the pred taper about a week ago, and then felt worse last week with non bloody diarrhea and vomiting. He was admitted o [facility] for reported dehydration and UC flare. We do not have records of the hospitalization.   No fevers. Currently no blood in stool. Has 5 loose stools daily. Denies abd pain. No vomiting since discharge 2 days ago.  He is able to eat. He does not think [facility] did anything for him-- apparently got some antibiotics. Advised he could either talk with [name] tomorrow as he sounds stable with non bloody stools and no fever or he could come to [facility] ED for further evaluation. He states he will come in for evaluation.

## 2018-08-02 MED FILL — XELJANZ 10 MG TABLET: 30 days supply | Qty: 60 | Fill #0 | Status: AC

## 2018-08-02 NOTE — Unmapped (Signed)
Contains abnormal data Sedimentation rate, manual   Order: [id number]   Status:  Final result ????    Sent to provider for review and action

## 2018-08-02 NOTE — Unmapped (Signed)
Pt is here for continued diarrhea.  Last able to eat anything was Friday after discharge.  Has some sharp LUQ pain

## 2018-08-02 NOTE — Unmapped (Signed)
Called pt/pt mom to f/u on msg that he was in the hospital. Pt did not answer. Left VM to call RN back with patient status. Does have app now on 3/3

## 2018-08-02 NOTE — Unmapped (Signed)
Pt reports N/V, was D/C from hospital for ulcerative colitis flare this week

## 2018-08-02 NOTE — Unmapped (Signed)
[facility]  Emergency Department Provider Note    ED Clinical Impression     Final diagnoses:   Diarrhea, unspecified type (Primary)   Ulcerative colitis without complications, unspecified location (CMS-HCC)       Initial Impression, ED Course, Assessment and Plan     Impression: Chuck Hint is a 28 y.o. male with a past medical history significant for ulcerative colitis, hypertension, depression, bipolar, and ADHD who presents for evaluation of several days of nonfocal abdominal pain and nonbloody diarrhea since discharge from an outside hospital on 2/21.    Afebrile and hemodynamically stable upon arrival to the emergency room.  He overall appears well and in no acute distress.  Lungs clear to auscultation bilaterally.  Cardiac exam unremarkable.  Abdomen soft, nontender, nondistended.    Given his recent history of medication changes, including being on and off of steroid taper, as well as recent admissions for possible UC flares, primary concern is for a recurrent UC flare.  Consider enteritis and colitis, though this seems less likely.  Patient has recently been on Flagyl.  No peritonitis or concern for acute surgical abdomen based on exam and history.  He is nontoxic without concern for sepsis or meningitis.  We will plan for basic labs and discussion with the GI team.  Will give IV fluids. Of note, the triage note references nausea and vomiting.  These are now present. No concern for urinary source.    Labs notable for white blood cell count of 8.0.  Mild anemia present, which has been seen previously.  Normal creatinine.  No significant electrolyte abnormalities.  Elevated inflammatory markers present.  I discussed the case with the on-call GI fellow, who is comfortable with discharge home on a steroid taper.  They will help facilitate outpatient follow-up in the GI clinic.  Patient and family comfortable with this plan.  Education and return precautions provided.  I provided a prescription for a steroid taper, per GI recommendations.    Additional Medical Decision Making     I have reviewed the vital signs and the nursing notes. Labs and radiology results that were available during my care of the patient were independently reviewed by me and considered in my medical decision making. I  reviewed the patient's prior medical records. I discussed the case with the GI consultant.     I discussed the case with the ED attending, Dr. Dimple Casey.    Portions of this record have been created using Scientist, clinical (histocompatibility and immunogenetics). Dictation errors have been sought, but may not have been identified and corrected. If any questions arise regarding the content of the note, please address the author for clarification.  ____________________________________________       History     Chief Complaint  Emesis      HPI   Chuck Hint is a 28 y.o. male with a past medical history significant for ulcerative colitis, hypertension, depression, bipolar, and ADHD who presents for evaluation of several days of nonfocal abdominal pain and nonbloody diarrhea since discharge from an outside hospital on 2/21.  Per EMR review and discussion with patient/family, he has been in and out of the hospital at [facility] and [facility] for ulcerative colitis complications twice over the past 2 months.  He seems to be most recently discharged on 2/21 from [place], though I do not have access to these records. Had previously been on a steroid taper, which is now complete.  Also reports recently being on Flagyl, I presume with concern for  colitis.  Now presenting with several days of nonbloody diarrhea and nonspecific abdominal pain.  Abdominal pain seems mostly to be located on the right side, though it is hard to characterize this.  No clear exacerbating or alleviating factors.  Reports several episodes (about 5) of nonbloody diarrhea.  No fever or chills.  No nausea or vomiting.  No chest pain or shortness of breath.  No urinary symptoms.  No other changes from baseline.  Discussed his symptoms with the on-call hotline, and ultimately decided to come to the emergency room.  He is currently taking [place].      Past Medical History  Past Medical History:   Diagnosis Date   ??? ADHD    ??? Asthma    ??? Bipolar 1 disorder (CMS-HCC)    ??? Depression    ??? GERD (gastroesophageal reflux disease)    ??? HOH (hard of hearing)    ??? Hypertension    ??? Mental developmental delay    ??? Seizures (CMS-HCC)     last seizures 2016   ??? Ulcerative colitis (CMS-HCC)        Patient Active Problem List   Diagnosis   ??? Ulcerative colitis (CMS-HCC)   ??? Bipolar 1 disorder (CMS-HCC)   ??? Asthma   ??? Elevated LFTs   ??? Autoimmune hepatitis (CMS-HCC)   ??? ADHD (attention deficit hyperactivity disorder)   ??? Seizure (CMS-HCC)   ??? Iron deficiency anemia       Past Surgical History  Past Surgical History:   Procedure Laterality Date   ??? ADENOIDECTOMY     ??? PR COLONOSCOPY FLX DX W/COLLJ SPEC WHEN PFRMD N/A 08/19/2017    Procedure: COLONOSCOPY, FLEXIBLE, PROXIMAL TO SPLENIC FLEXURE; DIAGNOSTIC, W/WO COLLECTION SPECIMEN BY BRUSH OR WASH;  Surgeon: Bluford Kaufmann, MD;  Location: [facility];  Service: Gastroenterology   ??? PR COLONOSCOPY W/BIOPSY SINGLE/MULTIPLE N/A 02/12/2017    Procedure: COLONOSCOPY, FLEXIBLE, PROXIMAL TO SPLENIC FLEXURE; WITH BIOPSY, SINGLE OR MULTIPLE;  Surgeon: Rona Ravens, MD;  [facility]: [facility];  Service: Gastroenterology   ??? PR SIGMOIDOSCOPY FLX DX W/COLLJ SPEC BR/WA IF PFRMD N/A 06/21/2018    Procedure: SIGMOIDOSCOPY, FLEXIBLE; DIAGNOSTIC, WITH OR WITHOUT COLLECTION OF SPECIMEN(S) BY BRUSHING OR WASHING;  Surgeon: Cletis Athens, MD;  Location: GI [facility];  Service: Gastroenterology       No current facility-administered medications for this encounter.     Current Outpatient Medications:   ???  albuterol (PROVENTIL HFA;VENTOLIN HFA) 90 mcg/actuation inhaler, Inhale 2 puffs every four (4) hours as needed. , Disp: , Rfl:   ???  albuterol 2.5 mg /3 mL (0.083 %) nebulizer solution, Inhale 2.5 mg every four (4) hours as needed. , Disp: , Rfl:   ???  cholecalciferol, vitamin D3, (CHOLECALCIFEROL) 1,000 unit tablet, Take 1,000 Units by mouth daily. , Disp: , Rfl:   ???  EPINEPHrine (EPIPEN) 0.3 mg/0.3 mL injection, Inject 0.3 mg into the muscle., Disp: , Rfl:   ???  famotidine (PEPCID) 20 MG tablet, Take 1 tablet (20 mg total) by mouth Two (2) times a day., Disp: 60 tablet, Rfl: 0  ???  loratadine (CLARITIN) 10 mg tablet, Take 10 mg by mouth daily. , Disp: , Rfl:   ???  montelukast (SINGULAIR) 10 mg tablet, Take 10 mg by mouth nightly. , Disp: , Rfl:   ???  ondansetron (ZOFRAN) 4 MG tablet, Take 4 mg by mouth every eight (8) hours as needed for nausea., Disp: , Rfl:   ???  paliperidone (INVEGA) 3 MG 24 hr tablet, Take 3 mg by mouth daily at 10am., Disp: , Rfl:   ???  predniSONE (DELTASONE) 10 MG tablet, Take 2 tablets (20 mg total) by mouth daily for 12 days. Take two tablets daily for 6 days followed by one tablet daily for 6 days, Disp: 18 tablet, Rfl: 0  ???  propranolol (INDERAL) 10 MG tablet, TK 1 T PO TID, Disp: , Rfl: 2  ???  sertraline (ZOLOFT) 100 MG tablet, Take 75 mg by mouth daily. , Disp: , Rfl:   ???  tofacitinib 10 mg Tab, Take 1 tablet (10 mg) by mouth Two (2) times a day., Disp: 60 tablet, Rfl: 0  ???  traZODone (DESYREL) 50 MG tablet, Take 50 mg by mouth nightly., Disp: , Rfl:   ???  tretinoin (RETIN-A) 0.025 % cream, Apply 1 application topically nightly., Disp: , Rfl:     Allergies  Infliximab; Pineapple; Strawberry; Tolmetin; Tomato; Nsaids (non-steroidal anti-inflammatory drug); Amoxicillin-pot clavulanate; and Omeprazole    Family History  Family History   Problem Relation Age of Onset   ??? Cancer Paternal Uncle         unknown, but has an ostomy   ??? Crohn's disease Paternal Aunt    ??? Cancer Maternal Uncle         colon   ??? Ulcerative colitis Neg Hx        Social History  Social History     Tobacco Use   ??? Smoking status: Never Smoker   ??? Smokeless tobacco: Never Used   Substance Use Topics   ??? Alcohol use: No   ??? Drug use: No       Review of Systems  10-point review of systems completed and negative except as otherwise documented.   Constitutional: Negative for fever.  Eyes: Negative for visual changes.  ENT: Negative for rhinorrhea.  Cardiovascular: Negative for chest pain.  Respiratory: Negative for shortness of breath.  Gastrointestinal: Positive for abdominal pain, diarrhea. Negative for vomiting.  Genitourinary: Negative for dysuria.  Musculoskeletal: Negative for extremity and back pain.  Skin: Negative for rash.  Neurological: Negative for headaches, focal weakness or numbness.    Physical Exam     ED Triage Vitals [08/01/18 1705]   Enc Vitals Group      BP 121/78      Heart Rate 88      SpO2 Pulse       Resp 18      Temp 36.1 ??C (97 ??F)      Temp Source Skin      SpO2 100 %      Weight       Height       Head Circumference       Peak Flow       Pain Score       Pain Loc       Pain Edu?       Excl. in GC?        General: Awake and alert; sitting comfortably; no acute distress  HEENT: Atraumatic, normocephalic; no conjunctival erythema or scleral icterus; pupils equally round; no epistaxis; no rhinorrhea; patent airway; MMM; neck supple; no meningismus  Cardiac: Normal rate; regular rhythm; normal S1/S2; no murmurs, rubs or gallops  Respiratory: Lungs clear to auscultation bilaterally without wheezes, rales, or rhonchi; symmetric chest movement without increased work of breathing; no chest tenderness  Abdominal: Soft, non-tender, non-distended; no rebound or guarding; no masses  Back: Normal  ROM  Extremities: 2+ radial pulses; no lower extremity edema; no clubbing  Skin: Warm, dry; no ecchymoses, petechia, purpura, cyanosis, or jaundice  MSK: No joint/extremity tenderness, effusion, or deformity  Neuro: Symmetric extremity movement; normal bulk and tone; symmetric face; fluent speech without dysarthria; memory intact; GCS 15  Psych: Awake, alert; follows commands; mood congruent with affect    Labs     See lab tab for full details.       Janit Pagan, MD  Resident  08/02/18 [id number]

## 2018-08-03 ENCOUNTER — Ambulatory Visit: Admit: 2018-08-03 | Discharge: 2018-08-04 | Payer: MEDICAID

## 2018-08-03 DIAGNOSIS — Z79899 Other long term (current) drug therapy: Secondary | ICD-10-CM

## 2018-08-03 DIAGNOSIS — F819 Developmental disorder of scholastic skills, unspecified: Secondary | ICD-10-CM

## 2018-08-03 DIAGNOSIS — K51811 Other ulcerative colitis with rectal bleeding: Secondary | ICD-10-CM

## 2018-08-03 DIAGNOSIS — R197 Diarrhea, unspecified: Principal | ICD-10-CM

## 2018-08-03 DIAGNOSIS — D500 Iron deficiency anemia secondary to blood loss (chronic): Secondary | ICD-10-CM

## 2018-08-03 MED ORDER — SULFAMETHOXAZOLE 400 MG-TRIMETHOPRIM 80 MG TABLET
ORAL_TABLET | Freq: Every day | ORAL | 0 refills | 0.00000 days | Status: SS
Start: 2018-08-03 — End: ?

## 2018-08-03 MED ORDER — PREDNISONE 10 MG TABLET
ORAL_TABLET | 0 refills | 0 days | Status: CP
Start: 2018-08-03 — End: 2018-10-01

## 2018-08-03 NOTE — Unmapped (Signed)
[facility]   FOLLOWUP NOTE - INFLAMMATORY BOWEL DISEASE  08/05/2018    Demographics:  Austin [name] is a 28 y.o. year old male    Diagnosis:  Ulcerative Colitis  Disease onset (yr): 65  Age at onset:   45-40 yr old (A2)  Location:  Unknown  Behavior:  Colitis  Current Tight Control Scenario:   Maintenance = Biologic          HPI / NOTE :   The patient presents with his mother today who provides collateral history and decision making support.    Interval Events:   1. Since last visit with me Jan 2020, he has been on [place] 10mg  BID for ~3-4 weeks.  He tapered off prednisone.  GI symptoms got worse as soon as he went off prednisone.   2.  Went to local ER for bronchitis, got Rx prednisone 60mg  for bronchitis - which helped his bowel symptoms  3.  Went to local hospital admitted for several days.  No steroids on discharge.   4.  Came to [facility] ER 08/01/2018 - notes reviewed. Discharged from ER on prednisone.      HPI:  Hard to get much history from [place].  Even mom can not give me too many details.  Mom does feel that she has observed him going to the bathroom a lot.  He does feel little better since being on prednisone after ER visit. From what I can gather, he is having 5-10 BMs/day, loose, watery, though only 3 BMs yesterday.  No extraintestinal symptoms.  No blood in stool.     Abdominal pain (0-10):  none  BM a day:  ?? 5-10  Consistency: loose  % of stools have blood: 0  Nocturnal BM: occasional  Urgency: no  Weight change over last 6 mo: stable x 1 month  Smoking: no  NSAIDS: avoids    Review of Systems:   Review of systems positive for: negative except as above.   Otherwise, the balance of 10 systems is negative.          IBD HISTORY:     Year of disease onset:  2015    Brief IBD Disease Course:    - 2015 - crampy abdominal pain, diarrhea, hematochezia.  Had colonoscopy and dx with UC. Rx budesonide and mesalamine which did not work.  No medications for 1-2 years after that.   - 2017 - Started on Infliximab with improvement.  October 2017 - developed C.diff but not able to be cleared until Feb 2018.  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 Pt given prednisone taper and started on Vedolizumab 03/2017. Had 2 brief flares after starting Vedolizumab (Jan 2019 - brief prednisone burst).    - 2019 - gap in [place] for ~5 months.  Re-load + prednisone in Oct 2019 but ongoing symptoms.  Admitted to [facility] Jan 2020 with severe colitis - got IV Steroids with initial improvement.     Endoscopy:      - Colonoscopy 02/12/17 - poor prep.  Mild inflammation from rectum to descending colon (Mayo 1).  Remaining colon and terminal ileum normal.     PATH = mildly active chronic colitis, no granulomas.    - Colonoscopy 08/19/17 - Normal ileum. Friable mucosa in sigmoid, patchy inflammation in Rectum only (Mayo 1).  Improved from prior colonoscopy 02/2017.   - Sigmoidoscopy 06/21/18 - Severe (Mayo 3) colitis from anus to transverse colon, worse  than prior.     Imaging:    - CT abd pel w/ IV contrast 06/17/18: mild wall thickening from transverse to rectum; mild fatty infiltration of sigmoid and rectal wall.    Prior IBD medications (type, dose, duration, response):  x 5-ASAs - briefly after dx- no response  x Oral corticosteroids - budesonide no response  x Intravenous corticosteroids - Jan 2020  ? Antibiotics  ? Thiopurines  ? 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 Anti-Interleukin therapies  ? Cyclosporine  ? Clinical trial medication  ? Other (Please specify):    IBD health maintenance:  Influenza vaccine: 03/2018  Pneumonia vaccine: 23 valent in 2017; 13 valent in 2019  Hepatitis B: 1994-1995  TB testing: Quant gold Oct 2019  Chickenpox/Shingles history: had chickenpox as a child  Bone denistometry:   Derm appointment:  Last small bowel imaging: Jan 2020  Last colonoscopy: March 2019    Extraintestinal manifestations:   -joint pains affecting: n  -eye: n  -skin: n  -oral ulcers :  n  -blood clots: n  -PSC: n  -other: n          Past Medical History:   Past medical history:   Past Medical History:   Diagnosis Date   ??? ADHD    ??? Asthma    ??? Bipolar 1 disorder (CMS-HCC)    ??? Depression    ??? GERD (gastroesophageal reflux disease)    ??? HOH (hard of hearing)    ??? Hypertension    ??? Mental developmental delay    ??? Seizures (CMS-HCC)     last seizures 2016   ??? Ulcerative colitis (CMS-HCC)      Past surgical history:   Past Surgical History:   Procedure Laterality Date   ??? ADENOIDECTOMY     ??? PR COLONOSCOPY FLX DX W/COLLJ SPEC WHEN PFRMD N/A 08/19/2017    Procedure: COLONOSCOPY, FLEXIBLE, PROXIMAL TO SPLENIC FLEXURE; DIAGNOSTIC, W/WO COLLECTION SPECIMEN BY BRUSH OR WASH;  Surgeon: Bluford Kaufmann, MD;  Location: [facility];  Service: Gastroenterology   ??? PR COLONOSCOPY W/BIOPSY SINGLE/MULTIPLE N/A 02/12/2017    Procedure: COLONOSCOPY, FLEXIBLE, PROXIMAL TO SPLENIC FLEXURE; WITH BIOPSY, SINGLE OR MULTIPLE;  Surgeon: Rona Ravens, MD;  Location: [facility];  Service: Gastroenterology   ??? PR SIGMOIDOSCOPY FLX DX W/COLLJ SPEC BR/WA IF PFRMD N/A 06/21/2018    Procedure: SIGMOIDOSCOPY, FLEXIBLE; DIAGNOSTIC, WITH OR WITHOUT COLLECTION OF SPECIMEN(S) BY BRUSHING OR WASHING;  Surgeon: Cletis Athens, MD;  [facility]: [facility];  Service: Gastroenterology     Family history:   Family History   Problem Relation Age of Onset   ??? Cancer Paternal Uncle         unknown, but has an ostomy   ??? Crohn's disease Paternal Aunt    ??? Cancer Maternal Uncle         colon   ??? Ulcerative colitis Neg Hx      Social history:   Social History     Socioeconomic History   ??? Marital status: Legally Separated     Spouse name: None   ??? Number of children: None   ??? Years of education: None   ??? Highest education level: None   Occupational History   ??? None   Social Needs   ??? Financial resource strain: None   ??? Food insecurity  Worry: None     Inability: None   ??? Transportation needs     Medical: None     Non-medical: None   Tobacco Use   ??? Smoking status: Never Smoker   ??? Smokeless tobacco: Never Used   Substance and Sexual Activity   ??? Alcohol use: No   ??? Drug use: No   ??? Sexual activity: None   Lifestyle   ??? Physical activity     Days per week: None     Minutes per session: None   ??? Stress: None   Relationships   ??? Social Wellsite geologist on phone: None     Gets together: None     Attends religious service: None     Active member of club or organization: None     Attends meetings of clubs or organizations: None     Relationship status: None   Other Topics Concern   ??? None   Social History Narrative   ??? None             Allergies:     Allergies   Allergen Reactions   ??? Infliximab Other (See Comments)     AUTOIMMUNE HEPATITIS     ??? Nsaids (Non-Steroidal Anti-Inflammatory Drug) Other (See Comments)     Should not take 2/2 GI condition  Other reaction(s): Other (See Comments)  Should not take due to GI condition   ??? Pineapple Swelling     Reaction:  Lip swelling   ??? Strawberry Swelling     Reaction:  Lip swelling   ??? Tolmetin Other (See Comments)     Should not take due to GI condition     ??? Tomato Swelling and Rash   ??? Amoxicillin-Pot Clavulanate Nausea And Vomiting and Nausea Only     GI intolerance   ??? Omeprazole Nausea And Vomiting             Medications:     Current Outpatient Medications   Medication Sig Dispense Refill   ??? albuterol (PROVENTIL HFA;VENTOLIN HFA) 90 mcg/actuation inhaler Inhale 2 puffs every four (4) hours as needed.      ??? albuterol 2.5 mg /3 mL (0.083 %) nebulizer solution Inhale 2.5 mg every four (4) hours as needed.      ??? cholecalciferol, vitamin D3, (CHOLECALCIFEROL) 1,000 unit tablet Take 1,000 Units by mouth daily.      ??? famotidine (PEPCID) 20 MG tablet Take 1 tablet (20 mg total) by mouth Two (2) times a day. 60 tablet 0   ??? loratadine (CLARITIN) 10 mg tablet Take 10 mg by mouth daily.      ??? montelukast (SINGULAIR) 10 mg tablet Take 10 mg by mouth nightly.      ??? ondansetron (ZOFRAN) 4 MG tablet Take 4 mg by mouth every eight (8) hours as needed for nausea.     ??? paliperidone (INVEGA) 3 MG 24 hr tablet Take 3 mg by mouth daily at 10am.     ??? propranolol (INDERAL) 10 MG tablet TK 1 T PO TID  2   ??? tofacitinib 10 mg Tab Take 1 tablet (10 mg) by mouth Two (2) times a day. 60 tablet 0   ??? traZODone (DESYREL) 50 MG tablet Take 50 mg by mouth nightly.     ??? tretinoin (RETIN-A) 0.025 % cream Apply 1 application topically nightly.     ??? EPINEPHrine (EPIPEN) 0.3 mg/0.3 mL injection Inject 0.3 mg into the muscle.     ???  predniSONE (DELTASONE) 10 MG tablet 60mg  x7days, 50mg  x7days, 40mg  x7days then decrease by 10mg  every 5days 135 tablet 0   ??? sertraline (ZOLOFT) 100 MG tablet Take 75 mg by mouth daily.      ??? sulfamethoxazole-trimethoprim (BACTRIM) 400-80 mg per tablet Take 1 tablet (80 mg of trimethoprim total) by mouth daily. 36 tablet 0     No current facility-administered medications for this visit.              Physical Exam:   BP 112/64  - Pulse 81  - Temp 36.3 ??C (97.3 ??F) (Temporal)  - Wt (!) 114.6 kg (252 lb 9.6 oz)  - BMI 42.03 kg/m??     GEN: overweight young male in no apparent distress, appears comfortable on exam  HEENT: PEERL, OP clear with no erythema, lesions, exudate, mucous membranes moist;  NECK: Supple, no lymphadenopathy  LUNGS: CTAB, no wheezes, rales, or rhonchi  CV: S1/S2, RRR, no murmurs  ABD: Obese, soft, NT, nondistended, normoactive bowel sounds, no appreciable organomegaly  Extremities: no cyanosis, clubbing or edema, normal gait  Psych: affect appropriate, A&O x3          Labs, Data & Indices:     Lab Review:   Lab Results   Component Value Date    WBC 8.0 08/01/2018    RBC 3.85 (L) 08/01/2018    HGB 10.4 (L) 08/01/2018     Lab Results   Component Value Date    AST 27 08/01/2018    ALT 13 08/01/2018 BUN <2 (L) 08/01/2018    Creatinine 0.75 08/01/2018    CO2 25.0 08/01/2018    Albumin 3.0 (L) 08/01/2018    Calcium 9.1 08/01/2018     No results found for: TSH   ..........................................................................................................................................Marland Kitchen  Modified Mayo Score for Ulcerative Colitis  Stool Frequency:  0 = Normal  Rectal bleeding:  0 = None  Physicians Global Assessment:  0 = Normal  Score:  0 though just finished IV steroids  Remission <2  ...........................................................................................................................................Marland Kitchen   Diagnosis ICD-10-CM Associated Orders   1. Diarrhea, unspecified type R19.7 C. Difficile Assay     C. Difficile Assay   2. Other ulcerative colitis with rectal bleeding (CMS-HCC) K51.811 predniSONE (DELTASONE) 10 MG tablet   3. Cognitive developmental delay F81.9    4. Iron deficiency anemia due to chronic blood loss D50.0    5. Immunosuppression due to drug therapy Z79.899 sulfamethoxazole-trimethoprim (BACTRIM) 400-80 mg per tablet           Assessment & Recommendations:   Disease state: Active UC flare    Austin [name] is a 28 y.o. male with a hx notable for ADHD, Seizure disorder, Developmental delay, Bipolar disorder, hard of hearing and GERD.  He has a hx of ulcerative colitis since 2015, previously with lack of response with mesalamine, prior exposure to Remicade (good response but stopped due to suspected DILI) and Vedolizumab (secondary loss of response).  He had mod-severe colitis flare fall 2019 that did not respond to Vedolizumab re-load and prednisone.  Hence, we have recently started [place]. His colitis appears to still be quite active.  Given that he has only gotten ~3 weeks of Xeljanz, I would like to give this medication some additional time to work, though I am quite concerned that he is progressing to medically refractory disease and may need surgery.      I discussed with Caryn Bee and his mom.  We ultimately settled on a prednisone burst for ~6 weeks to  see if [name] will start working.  If that does not work (or symptoms worsen), I think we should seriously consider colectomy.  Stelara (or rechallenge with anti-TNF) would be options, but I am not sure they would be effective.      PLAN:  1.  Continue xeljanz 10mg  BID for now.   2.  Use prednisone 60mg  for 1 week, 50mg  for 1 week, 40mg  for 1 week then continue tapering by 10mg  every 5 days.   3.  Given the considerable immune suppression with [name] + high dose prednisone I am recommending prophylactic bactrim.   4.  He has worsened diarrhea so we will check for c.diff  5.  Labs ok from recent ER visit, though he is anemic and we may need to consider IV Iron.   --------------------------------------------  Author: Zetta Bills 08/05/2018 9:58 PM    Zetta Bills, MD  Assistant Professor of Medicine  Division of Gastroenterology & Hepatology  [facility]

## 2018-08-03 NOTE — Unmapped (Addendum)
INSTRUCTIONS:     1.  Continue xeljanz  2.  Use prednisone 60mg  for 1 week, 50mg  for 1 wee, 40mg  for 1 week then continue tapering by 10mg  every 5 days.   3.  Take Bactrim 1 tablet daily (single strength) while on prednisone  4.  Stool test to check for c.diff  5.  If any trouble or symptoms, do not hesitate to call.     Zetta Bills, MD  Assistant Professor of Medicine  Division of Gastroenterology & Hepatology  [place] [facility] [place]    Clinical Nurse Contact:  [name], RN  Ph#  [phone]  Fax#  [phone]    Office Number Rubbie Battiest):  Ph# [phone]  Fax # [phone]    For clinic appointments, please call, [phone], option 1.  For questions regarding radiology appointments, or to schedule, [phone].  For questions regarding scheduling GI procedures (e.g,. Colonoscopy), please call, [phone], option 2.    For educational material and resources:  [email or web address]  ============================================

## 2018-08-06 NOTE — Unmapped (Signed)
Pt needs f/u app, pt mom called as schedulers cancelled his original 4/7 appointment. Unsure why. Pt now scheduled for 4/7 at 1:00. Pt mom also they [facility] but [facility] did not have the order. Re entered order and sent pt via mychart letter with the order to use in case they have issues.

## 2018-08-24 DIAGNOSIS — K51811 Other ulcerative colitis with rectal bleeding: Principal | ICD-10-CM

## 2018-08-24 NOTE — Unmapped (Signed)
[facility] Refill Coordination Note    Specialty Medication(s) to be Shipped:   Inflammatory Disorders: [name]    Other medication(s) to be shipped: none     Austin [name], DOB: Jun 08, 1991  Phone: There are no phone numbers on file.      All above HIPAA information was verified with patient's family member.     Completed refill call assessment today to schedule patient's medication shipment from the [facility] [phone]).       Specialty medication(s) and dose(s) confirmed: Regimen is correct and unchanged.   Changes to medications: Caryn Bee reports no changes reported at this time.  Changes to insurance: No  Questions for the pharmacist: No    Confirmed patient received Welcome Packet with first shipment. The patient will receive a drug information handout for each medication shipped and additional FDA Medication Guides as required.       DISEASE/MEDICATION-SPECIFIC INFORMATION        N/A    SPECIALTY MEDICATION ADHERENCE     Medication Adherence    Patient reported X missed doses in the last month:  0  Specialty Medication:  [name] 10mg   Patient is on additional specialty medications:  No                Xeljanz 10 mg: Enough until the end of the month days of medicine on hand          SHIPPING     Shipping address confirmed in Epic.     Delivery Scheduled: Yes, Expected medication delivery date: 08/31/18.  However, Rx request for refills was sent to the provider as there are none remaining.     Medication will be delivered via UPS to the home address in Epic WAM.    Arnold Long   [facility] Specialty Pharmacist

## 2018-08-25 MED ORDER — TOFACITINIB 10 MG TABLET
ORAL_TABLET | Freq: Two times a day (BID) | ORAL | 3 refills | 0.00000 days | Status: SS
Start: 2018-08-25 — End: ?

## 2018-08-25 NOTE — Unmapped (Signed)
tofacitinib refilled per protocol. Safety labs current.

## 2018-08-30 MED FILL — XELJANZ 10 MG TABLET: ORAL | 30 days supply | Qty: 60 | Fill #0

## 2018-08-30 MED FILL — XELJANZ 10 MG TABLET: 30 days supply | Qty: 60 | Fill #0 | Status: AC

## 2018-09-13 NOTE — Unmapped (Signed)
Unable to reach patient for virtual visit pre screening. LVM  Nfowler, CMA

## 2018-09-13 NOTE — Unmapped (Signed)
Verified patients meds, allergies, and travel screening, for virtual vist 4/7  Nfowler, CMA

## 2018-09-14 ENCOUNTER — Institutional Professional Consult (permissible substitution): Admit: 2018-09-14 | Discharge: 2018-09-15 | Payer: MEDICAID

## 2018-09-14 DIAGNOSIS — Z79899 Other long term (current) drug therapy: Secondary | ICD-10-CM

## 2018-09-14 DIAGNOSIS — K51811 Other ulcerative colitis with rectal bleeding: Principal | ICD-10-CM

## 2018-09-14 DIAGNOSIS — F819 Developmental disorder of scholastic skills, unspecified: Secondary | ICD-10-CM

## 2018-09-14 NOTE — Unmapped (Signed)
INSTRUCTIONS:     1.  Continue xeljanz 10mg  daily for 4 weeks, then decrease to 5mg  twice daily.   2.  Stop prednisone per taper  3.  Do Labwork at [facility] within the next week (orders have been sent, you do not need a lab slip or paperwork)  4.  Follow up in ~2 months.  Reassess at that time.   5.  Call if worsening symptoms.     Zetta Bills, MD  Assistant Professor of Medicine  Division of Gastroenterology & Hepatology  [place] [facility] [place] [facility] [place]    Clinical Nurse Contact:  Alesia Banda, RN (covering for [name])  Ph#  [phone]  Fax#  [phone]    Office Number Rubbie Battiest):  Ph# [phone]  Fax # [phone]    For clinic appointments, please call, [phone], option 1.  For questions regarding radiology appointments, or to schedule, [phone].  For questions regarding scheduling GI procedures (e.g,. Colonoscopy), please call, [phone], option 2.    For educational material and resources:  [email or web address]  ============================================

## 2018-09-14 NOTE — Unmapped (Signed)
[facility]   FOLLOWUP NOTE - INFLAMMATORY BOWEL DISEASE  09/14/2018    Demographics:  Austin [name] is a 28 y.o. year old male    Diagnosis:  Ulcerative Colitis  Disease onset (yr): 34  Age at onset:   14-40 yr old (A2)  Location:  Unknown  Behavior:  Colitis  Current Tight Control Scenario:   Maintenance = Biologic          HPI / NOTE :     Telehealth/Video Visit  Identification: Pt self identified using name and date of birth  Patient location: home in [place], [place].  The limitations of this telemedicine encounter were discussed with patient. Both the patient and myself agreed to this encounter despite these limitations. Benefits of this telemedicine encounter included allowing for continued care of patient and minimizing risk of exposure to COVID-19. Patient also aware that this is a billable encounter with possible copay.     The patient's mother was with him on the phone today.  Mom provides collateral history and decision making support.    HPI:  Austin [name] reports feeling much better.  In particular he reports abdominal pain is much improved from before.  BMs are still 6-7 times per day, soft, mostly formed.  (prior baseline was 2-3x/day).  He does have some urgency but no nocturnal stools.  He is almost done with prednisone - down to 5mg  daily.  He has not noticed worsening of his symptoms with the prednisone taper this time.  He is still on Xeljanz 10mg  BID.  No recent labs since Feb 2020. Overall, Austin [name] says that he feels 60-70% better from January to now.  He    He did have a few days of rectal bleeding last week (3 days) but that has resolved and no blood in past several days.  He denies any extraintestinal symptoms.     Abdominal pain (0-10):  none  BM a day:  6-7  Consistency: soft   % of stools have blood: 0  Nocturnal BM: no  Urgency: yes+  Weight change over last 6 mo: stable  Smoking: no  NSAIDS: avoids    Review of Systems:   Review of systems positive for: negative except as above.   Otherwise, the balance of 10 systems is negative.          IBD HISTORY:     Year of disease onset:  2015    Brief IBD Disease Course:    - 2015 - crampy abdominal pain, diarrhea, hematochezia.  Had colonoscopy and dx with UC. Rx budesonide and mesalamine which did not work.  No medications for 1-2 years after that.   - 2017 - Started on Infliximab with improvement.  October 2017 - developed C.diff but not able to be cleared until Feb 2018.  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 Pt given prednisone taper and started on Vedolizumab 03/2017. Had 2 brief flares after starting Vedolizumab (Jan 2019 - brief prednisone burst).    - 2019 - gap in Entyvio for ~5 months.  Re-load + prednisone in Oct 2019 but ongoing symptoms.  Admitted to [facility] Jan 2020 with severe colitis - got IV Steroids with initial improvement.  Feb 2020 - started [name] 10mg  BID with high dose prednisone taper.     Endoscopy:      - Colonoscopy 02/12/17 - poor prep.  Mild inflammation from rectum to descending colon (Mayo 1).  Remaining colon and terminal  ileum normal.     PATH = mildly active chronic colitis, no granulomas.    - Colonoscopy 08/19/17 - Normal ileum. Friable mucosa in sigmoid, patchy inflammation in Rectum only (Mayo 1).  Improved from prior colonoscopy 02/2017.   - Sigmoidoscopy 06/21/18 - Severe (Mayo 3) colitis from anus to transverse colon, worse than prior.     Imaging:    - CT abd pel w/ IV contrast 06/17/18: mild wall thickening from transverse to rectum; mild fatty infiltration of sigmoid and rectal wall.    Prior IBD medications (type, dose, duration, response):  x 5-ASAs - briefly after dx- no response  x Oral corticosteroids - budesonide no response  x Intravenous corticosteroids - Jan 2020  ? Antibiotics  ? Thiopurines  ? Methotrexate  x Anti-TNF therapies - Infliximab 2017-2018 - good response but developed suspected DILI so stopped  x Anti-Integrin therapies - Vedolizumab 03/2017, secondary loss of response  X Anti-JAK therapies - Tofacitinib Feb 2020  ? Anti-Interleukin therapies  ? Cyclosporine  ? Clinical trial medication  ? Other (Please specify):    IBD health maintenance:  Influenza vaccine: 03/2018  Pneumonia vaccine: 23 valent in 2017; 13 valent in 2019  Hepatitis B: 1994-1995  TB testing: Quant gold Oct 2019  Chickenpox/Shingles history: had chickenpox as a child  Bone denistometry:   Derm appointment:  Last small bowel imaging: Jan 2020  Last colonoscopy: March 2019    Extraintestinal manifestations:   -joint pains affecting: n  -eye: n  -skin: n  -oral ulcers :  n  -blood clots: n  -PSC: n  -other: n          Past Medical History:   Past medical history:   Past Medical History:   Diagnosis Date   ??? ADHD    ??? Asthma    ??? Bipolar 1 disorder (CMS-HCC)    ??? Depression    ??? GERD (gastroesophageal reflux disease)    ??? HOH (hard of hearing)    ??? Hypertension    ??? Mental developmental delay    ??? Seizures (CMS-HCC)     last seizures 2016   ??? Ulcerative colitis (CMS-HCC)      Past surgical history:   Past Surgical History:   Procedure Laterality Date   ??? ADENOIDECTOMY     ??? PR COLONOSCOPY FLX DX W/COLLJ SPEC WHEN PFRMD N/A 08/19/2017    Procedure: COLONOSCOPY, FLEXIBLE, PROXIMAL TO SPLENIC FLEXURE; DIAGNOSTIC, W/WO COLLECTION SPECIMEN BY BRUSH OR WASH;  Surgeon: Bluford Kaufmann, MD;  [facility];  Service: Gastroenterology   ??? PR COLONOSCOPY W/BIOPSY SINGLE/MULTIPLE N/A 02/12/2017    Procedure: COLONOSCOPY, FLEXIBLE, PROXIMAL TO SPLENIC FLEXURE; WITH BIOPSY, SINGLE OR MULTIPLE;  Surgeon: Rona Ravens, MD;  [facility]: [facility];  Service: Gastroenterology   ??? PR SIGMOIDOSCOPY FLX DX W/COLLJ SPEC BR/WA IF PFRMD N/A 06/21/2018    Procedure: SIGMOIDOSCOPY, FLEXIBLE; DIAGNOSTIC, WITH OR WITHOUT COLLECTION OF SPECIMEN(S) BY BRUSHING OR WASHING;  Surgeon: Cletis Athens, MD;  [facility]: [facility];  Service: Gastroenterology     Family history:   Family History   Problem Relation Age of Onset   ??? Cancer Paternal Uncle         unknown, but has an ostomy   ??? Crohn's disease Paternal Aunt    ??? Cancer Maternal Uncle         colon   ??? Ulcerative colitis Neg Hx      Social history:  Social History     Socioeconomic History   ??? Marital status: Legally Separated     Spouse name: Not on file   ??? Number of children: Not on file   ??? Years of education: Not on file   ??? Highest education level: Not on file   Occupational History   ??? Not on file   Social Needs   ??? Financial resource strain: Not on file   ??? Food insecurity     Worry: Not on file     Inability: Not on file   ??? Transportation needs     Medical: Not on file     Non-medical: Not on file   Tobacco Use   ??? Smoking status: Never Smoker   ??? Smokeless tobacco: Never Used   Substance and Sexual Activity   ??? Alcohol use: No   ??? Drug use: No   ??? Sexual activity: Not on file   Lifestyle   ??? Physical activity     Days per week: Not on file     Minutes per session: Not on file   ??? Stress: Not on file   Relationships   ??? Social Wellsite geologist on phone: Not on file     Gets together: Not on file     Attends religious service: Not on file     Active member of club or organization: Not on file     Attends meetings of clubs or organizations: Not on file     Relationship status: Not on file   Other Topics Concern   ??? Not on file   Social History Narrative   ??? Not on file             Allergies:     Allergies   Allergen Reactions   ??? Infliximab Other (See Comments)     AUTOIMMUNE HEPATITIS     ??? Nsaids (Non-Steroidal Anti-Inflammatory Drug) Other (See Comments)     Should not take 2/2 GI condition  Other reaction(s): Other (See Comments)  Should not take due to GI condition   ??? Pineapple Swelling     Reaction:  Lip swelling   ??? Strawberry Swelling     Reaction:  Lip swelling   ??? Tolmetin Other (See Comments)     Should not take due to GI condition     ??? Tomato Swelling and Rash   ??? Amoxicillin-Pot Clavulanate Nausea And Vomiting and Nausea Only     GI intolerance   ??? Omeprazole Nausea And Vomiting             Medications:     Current Outpatient Medications   Medication Sig Dispense Refill   ??? albuterol (PROVENTIL HFA;VENTOLIN HFA) 90 mcg/actuation inhaler Inhale 2 puffs every four (4) hours as needed.      ??? albuterol 2.5 mg /3 mL (0.083 %) nebulizer solution Inhale 2.5 mg every four (4) hours as needed.      ??? cholecalciferol, vitamin D3, (CHOLECALCIFEROL) 1,000 unit tablet Take 1,000 Units by mouth daily.      ??? EPINEPHrine (EPIPEN) 0.3 mg/0.3 mL injection Inject 0.3 mg into the muscle.     ??? famotidine (PEPCID) 20 MG tablet Take 1 tablet (20 mg total) by mouth Two (2) times a day. 60 tablet 0   ??? loratadine (CLARITIN) 10 mg tablet Take 10 mg by mouth daily.      ??? montelukast (SINGULAIR) 10 mg tablet Take 10 mg by mouth nightly.      ???  ondansetron (ZOFRAN) 4 MG tablet Take 4 mg by mouth every eight (8) hours as needed for nausea.     ??? paliperidone (INVEGA) 3 MG 24 hr tablet Take 3 mg by mouth daily at 10am.     ??? predniSONE (DELTASONE) 10 MG tablet 60mg  x7days, 50mg  x7days, 40mg  x7days then decrease by 10mg  every 5days 135 tablet 0   ??? propranolol (INDERAL) 10 MG tablet TK 1 T PO TID  2   ??? sertraline (ZOLOFT) 100 MG tablet Take 75 mg by mouth daily.      ??? sulfamethoxazole-trimethoprim (BACTRIM) 400-80 mg per tablet Take 1 tablet (80 mg of trimethoprim total) by mouth daily. (Patient not taking: Reported on 09/13/2018) 36 tablet 0   ??? tofacitinib 10 mg Tab Take 1 tablet (10 mg) by mouth Two (2) times a day. 60 tablet 3   ??? traZODone (DESYREL) 50 MG tablet Take 50 mg by mouth nightly.     ??? tretinoin (RETIN-A) 0.025 % cream Apply 1 application topically nightly.       No current facility-administered medications for this visit.              Physical Exam:   There were no vitals taken for this visit.    PHONE VISIT - NO PHYSICAL EXAM          Labs, Data & Indices:     Lab Review:   Lab Results   Component Value Date    WBC 8.0 08/01/2018    RBC 3.85 (L) 08/01/2018    HGB 10.4 (L) 08/01/2018     Lab Results   Component Value Date    AST 27 08/01/2018    ALT 13 08/01/2018    BUN <2 (L) 08/01/2018    Creatinine 0.75 08/01/2018    CO2 25.0 08/01/2018    Albumin 3.0 (L) 08/01/2018    Calcium 9.1 08/01/2018     No results found for: TSH   ..........................................................................................................................................Marland Kitchen  Modified Mayo Score for Ulcerative Colitis  Stool Frequency:  2 = 3-4 more than normal  Rectal bleeding:  0 = None  Physicians Global Assessment:  2 =Moderate colitis  Score:  4  Remission <2  ...........................................................................................................................................Marland Kitchen   Diagnosis ICD-10-CM Associated Orders   1. Other ulcerative colitis with rectal bleeding (CMS-HCC) K51.811    2. Cognitive developmental delay F81.9    3. 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 male with a hx notable for ADHD, Seizure disorder, Developmental delay, Bipolar disorder, hard of hearing and GERD.  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  He had mod-severe colitis flare fall 2019 that did not respond to Vedolizumab re-load and prednisone.  Hence, we started [place] + prednisone taper in Feb 2020. He has had clear improvement on [name] but is not yet in symptomatic remission.  I have asked him to complete the prednisone taper, and then drop the Xeljanz to the 10mg  BID dosing in 4 weeks.  I would like to have a follow up office visit (or phone visit if needed) in about 2 months to reassess. If he is still quite symptomatic at that time, we may do a repeat sigmoidoscopy  to assess colitis activity.  He is also due for labs, which he will at [facility].     PLAN:  1.  Continue xeljanz 10mg  daily for 4 weeks, then decrease to 5mg  twice daily.   2.  Stop prednisone per taper  3.  Do Labwork at [facility] within the next week (orders have been sent, you do not need a lab slip or paperwork)  4.  Follow up in ~2 months.  Reassess at that time.   5.  Call if worsening symptoms.   --------------------------------------------  Author: Zetta Bills 09/14/2018 1:28 PM    Zetta Bills, MD  Assistant Professor of Medicine  Division of Gastroenterology & Hepatology  [facility] [place] [facility] [place]    ======================================================  Telehealth/Video Encounter    Follow up: Austin [name] will be seen for follow-up visit in 2 months and will call or message provider should anything change or any new issues arise.    14 min was spent on this medically necessary service. Pt visit by phone in the setting of State of Emergency due to COVID-19 Pandemic.     This patient visit was completed through the use of an audio/video or telephone encounter.      This patient encounter is appropriate and reasonable under the circumstances given the patient's particular presentation at this time. The patient has been advised of the potential risks and limitations of this mode of treatment (including, but not limited to, the absence of in-person examination) and has agreed to be treated in a remote fashion in spite of them. Any and all of the patient's/patient's family's questions on this issue have been answered.     The patient has also been advised to contact this office for worsening conditions or problems, and seek emergency medical treatment and/or call 911 if the patient deems either necessary.

## 2018-09-17 NOTE — Unmapped (Signed)
[facility] Refill Coordination Note    Specialty Medication(s) to be Shipped:   General Specialty: [name] 10mg     Other medication(s) to be shipped:       Austin [name], DOB: 1990/10/13  Phone: There are no phone numbers on file.      All above HIPAA information was verified with patient's family member.     Completed refill call assessment today to schedule patient's medication shipment from the [facility] [phone]).       Specialty medication(s) and dose(s) confirmed: Regimen is correct and unchanged.   Changes to medications: Caryn Bee reports no changes at this time.  Changes to insurance: No  Questions for the pharmacist: No    Confirmed patient received Welcome Packet with first shipment. The patient will receive a drug information handout for each medication shipped and additional FDA Medication Guides as required.       DISEASE/MEDICATION-SPECIFIC INFORMATION        N/A    SPECIALTY MEDICATION ADHERENCE     Medication Adherence    Patient reported X missed doses in the last month:  0  Specialty Medication:  [name] 10mg                  Xeljanz 10 mg: 14 days of medicine on hand       SHIPPING     Shipping address confirmed in Epic.     Delivery Scheduled: Yes, Expected medication delivery date: 041720.     Medication will be delivered via UPS to the home address in Epic WAM.    Austin [name]   [facility] Specialty Technician

## 2018-09-23 MED FILL — XELJANZ 10 MG TABLET: ORAL | 30 days supply | Qty: 60 | Fill #1

## 2018-09-23 MED FILL — XELJANZ 10 MG TABLET: 30 days supply | Qty: 60 | Fill #1 | Status: AC

## 2018-09-25 LAB — CBC
HEMOGLOBIN: 11.4 g/dL — ABNORMAL LOW (ref 13.0–17.7)
HEMOGLOBIN: 11.4 g/dL — ABNORMAL LOW (ref 13.0–17.7)
MEAN CORPUSCULAR HEMOGLOBIN: 27.8 pg (ref 26.6–33.0)
MEAN CORPUSCULAR VOLUME: 84 fL (ref 79–97)
RED CELL DISTRIBUTION WIDTH: 14.9 % (ref 11.6–15.4)
WHITE BLOOD CELL COUNT: 11.1 10*3/uL — ABNORMAL HIGH (ref 3.4–10.8)

## 2018-09-25 LAB — COMPREHENSIVE METABOLIC PANEL
A/G RATIO: 1.8 (ref 1.2–2.2)
ALBUMIN: 4.5 g/dL (ref 4.1–5.2)
ALKALINE PHOSPHATASE: 57 IU/L (ref 39–117)
ALT (SGPT): 9 IU/L (ref 0–44)
AST (SGOT): 11 IU/L (ref 0–40)
BILIRUBIN TOTAL: <0.2 mg/dL (ref 0.0–1.2)
BLOOD UREA NITROGEN: 5 mg/dL — ABNORMAL LOW (ref 6–20)
CALCIUM: 9.8 mg/dL (ref 8.7–10.2)
CHLORIDE: 104 mmol/L (ref 96–106)
CO2: 17 mmol/L — ABNORMAL LOW (ref 20–29)
CREATININE: 1.05 mg/dL (ref 0.76–1.27)
GFR MDRD AF AMER: 113 mL/min/{1.73_m2}
GLOBULIN, TOTAL: 2.5 g/dL (ref 1.5–4.5)
GLUCOSE: 88 mg/dL (ref 65–99)
POTASSIUM: 3.9 mmol/L (ref 3.5–5.2)
SODIUM: 141 mmol/L (ref 134–144)
TOTAL PROTEIN: 7 g/dL — ABNORMAL LOW (ref 6.0–8.5)

## 2018-09-25 LAB — IRON & TIBC
IRON SATURATION: 13 % — ABNORMAL LOW (ref 15–55)
IRON SATURATION: 13 % — ABNORMAL LOW (ref 15–55)
UNSATURATED IRON BINDING CAPACITY: 321 ug/dL (ref 111–343)

## 2018-09-27 NOTE — Unmapped (Signed)
Patient/mom called with concerns due to ongoing diarrhea.  Says he is currently having more than 10BMS per day with urgency.  No blood. Some abdominal pain cramping. Has tapered completely off the prednisone as directed, still on 10mg  BID xeljanz (he was going to drop to 5mg  BID after 4weeks) .  No new medications, OTC, supplements, antibiotics.  No sick contacts.  He went by [facility] on Friday to get labs done that were requested at time of appt, see Epic DOS 09/24/2018.  Will review with Dr. Raphael Gibney.

## 2018-09-28 NOTE — Unmapped (Signed)
Called patient/mom twice today, no answer, will call again tomorrow am.  Concerned about UC flare (vs. C.diff), refractory to Xeljanz 10mg  BID.

## 2018-09-29 ENCOUNTER — Ambulatory Visit
Admit: 2018-09-29 | Discharge: 2018-10-01 | Disposition: A | Discharge status: Home / Self Care | Payer: MEDICAID | Admitting: Internal Medicine

## 2018-09-29 LAB — CBC W/ AUTO DIFF
BASOPHILS RELATIVE PERCENT: 0.2 %
EOSINOPHILS ABSOLUTE COUNT: 0.2 10*9/L (ref 0.0–0.4)
EOSINOPHILS RELATIVE PERCENT: 2.1 %
EOSINOPHILS RELATIVE PERCENT: 2.1 % — ABNORMAL HIGH (ref 0.0–0.1)
HEMATOCRIT: 33.3 % — ABNORMAL LOW (ref 41.0–53.0)
HEMOGLOBIN: 10.6 g/dL — ABNORMAL LOW (ref 13.5–17.5)
LARGE UNSTAINED CELLS: 2 % (ref 0–4)
LYMPHOCYTES ABSOLUTE COUNT: 1.1 10*9/L — ABNORMAL LOW (ref 1.5–5.0)
LYMPHOCYTES RELATIVE PERCENT: 14.5 %
MEAN CORPUSCULAR HEMOGLOBIN CONC: 32 g/dL (ref 31.0–37.0)
MEAN CORPUSCULAR HEMOGLOBIN: 27 pg (ref 26.0–34.0)
MEAN CORPUSCULAR VOLUME: 84.5 fL (ref 80.0–100.0)
MEAN PLATELET VOLUME: 8 fL (ref 7.0–10.0)
MONOCYTES ABSOLUTE COUNT: 0.4 10*9/L (ref 0.2–0.8)
MONOCYTES RELATIVE PERCENT: 5.6 %
NEUTROPHILS ABSOLUTE COUNT: 5.7 10*9/L (ref 2.0–7.5)
PLATELET COUNT: 395 10*9/L (ref 150–440)
RED BLOOD CELL COUNT: 3.94 10*12/L — ABNORMAL LOW (ref 4.50–5.90)
RED CELL DISTRIBUTION WIDTH: 16.3 % — ABNORMAL HIGH (ref 12.0–15.0)
WBC ADJUSTED: 7.5 10*9/L (ref 4.5–11.0)

## 2018-09-29 LAB — COMPREHENSIVE METABOLIC PANEL
ALBUMIN: 3.8 g/dL (ref 3.5–5.0)
ALKALINE PHOSPHATASE: 51 U/L (ref 38–126)
ALT (SGPT): 12 U/L (ref <50)
ANION GAP: 13 mmol/L (ref 7–15)
AST (SGOT): 22 U/L (ref 19–55)
BILIRUBIN TOTAL: 0.2 mg/dL (ref 0.0–1.2)
BLOOD UREA NITROGEN: 4 mg/dL — ABNORMAL LOW (ref 7–21)
BUN / CREAT RATIO: 5
CALCIUM: 9.6 mg/dL (ref 8.5–10.2)
CHLORIDE: 108 mmol/L — ABNORMAL HIGH (ref 98–107)
CO2: 20 mmol/L — ABNORMAL LOW (ref 22.0–30.0)
CREATININE: 0.8 mg/dL (ref 0.70–1.30)
EGFR CKD-EPI AA MALE: >90 mL/min/{1.73_m2} (ref >=60)
EGFR CKD-EPI NON-AA MALE: >90 mL/min/{1.73_m2} (ref >=60)
GLUCOSE RANDOM: 108 mg/dL (ref 70–179)
GLUCOSE RANDOM: 108 mg/dL — ABNORMAL HIGH (ref 70–179)
PROTEIN TOTAL: 7 g/dL (ref 6.5–8.3)
SODIUM: 141 mmol/L (ref 135–145)

## 2018-09-29 NOTE — Unmapped (Signed)
Pt reports diarrhea and blood in stoll X3 weeks. Pt with hx of UC and reports referred to ED by GI dr. Pt denies n/v, fever, cough, SOB

## 2018-09-29 NOTE — Unmapped (Signed)
I spoke with patient's mother this evening ( on phone).  Sounds like Austin [name] is not doing well - 10++ BMs/day including multiple nocturnal stools. going all the time.  Some streaks of blood also. Poor appetite - mom thinks his weight is going down.  No fevers or chills.  Of note, he has had C.diff once in the past.  He remains on Xeljanz 10mg  BID with no improvement.      I am worried that he has severe medically refractory UC and I recommended coming to ER to be hospitalized for management. I asked mom to bring Austin [name] to [facility] ER - she says she will bring him tomorrow morning.     Plan would be as follows:   1. Admission with GI-medicine and GI-surgery (lower GI) consults      *HE SHOULD BE ADMITTED TO [facility] (NOT [facility])  2. Stool test to rule out C.diff  3.  Flex sig to confirm colitis severity and bx for CMV  4.  IV Steroids to try to induce remission (long term medical options are limited - best would likely be Ustekinumab at this point).   5.  May need urgent colectomy.  I explained this to mom today - he has medically refractory colitis and high chance that he will need surgery soon.

## 2018-09-29 NOTE — Unmapped (Signed)
[facility]  Emergency Department Provider Note    ED Clinical Impression     Final diagnoses:   C. difficile diarrhea (Primary)       Initial Impression, ED Course, Assessment and Plan     Impression: Austin [name] is a 28 y.o. male with a PMH of asthma, GERD, HTN, UC, C. diff, autoimmune hepatitis, bipolar 1 disorder, mental developmental delay, and seizures who presents for acute-on-chronic diarrhea with new hematochezia.    On exam, the patient is comfortable appearing and in NAD. His vitals are WNL and he is afebrile. HRRR, lungs CTAB, and normal work of breathing. Abdomen soft, nontender, and nondistended.     Concern for UC exacerbation given patient's history.    Plan for basic labs. Will give solumedrol and reassess. Consulted MAO for admission.    ED Course as of [name] 30 1917   Wed [name] 29, 2018   1505 Anemia from 11 baseline   HGB(!): 10.6         Additional Medical Decision Making     I have reviewed the vital signs and the nursing notes. Labs and radiology results that were available during my care of the patient were independently reviewed by me and considered in my medical decision making.     I staffed the case with the ED attending, Dr. [name].    Portions of this record have been created using Scientist, clinical (histocompatibility and immunogenetics). Dictation errors have been sought, but may not have been identified and corrected.  ____________________________________________       History     Chief Complaint  Diarrhea      HPI   Austin [name] is a 28 y.o. male with a PMH of asthma, GERD, HTN, UC, C. diff, autoimmune hepatitis, bipolar 1 disorder, mental developmental delay, and seizures who presents for diarrhea. Per chart review, that patient sees GI regularly and on 4/07, his abdominal pain and diarrhea were much improved from earlier in the year. At that time, he was having 6-7 soft, mostly formed BMs per day. Yesterday, he called his GI specialist for worsening malaise, anorexia, and 10+ BMs per day with nocturnal stools and some streaks of blood. He has been taking Xeljanz 10 mg BID without improvement. He was referred to the ED for concern for severe refractory UC exacerbation and likely admission.    The patient states that in his BMs, there is a lot of blood. He denies abdominal pain, fever, chills, or shortness of breath. He reports not currently taking steroids or xeljanz but states that he is taking something for my stomach.       Past Medical History:   Diagnosis Date   ??? ADHD    ??? Asthma    ??? Bipolar 1 disorder (CMS-HCC)    ??? Depression    ??? GERD (gastroesophageal reflux disease)    ??? HOH (hard of hearing)    ??? Hypertension    ??? Mental developmental delay    ??? Seizures (CMS-HCC)     last seizures 2016   ??? Ulcerative colitis (CMS-HCC)        Patient Active Problem List   Diagnosis   ??? Ulcerative colitis (CMS-HCC)   ??? Bipolar 1 disorder (CMS-HCC)   ??? Asthma   ??? Elevated LFTs   ??? Autoimmune hepatitis (CMS-HCC)   ??? ADHD (attention deficit hyperactivity disorder)   ??? Seizure (CMS-HCC)   ??? Iron deficiency anemia       Past Surgical History:   Procedure  Laterality Date   ??? ADENOIDECTOMY     ??? PR COLONOSCOPY FLX DX W/COLLJ SPEC WHEN PFRMD N/A 08/19/2017    Procedure: COLONOSCOPY, FLEXIBLE, PROXIMAL TO SPLENIC FLEXURE; DIAGNOSTIC, W/WO COLLECTION SPECIMEN BY BRUSH OR WASH;  Surgeon: Bluford Kaufmann, MD;  [facility]: [facility];  Service: Gastroenterology   ??? PR COLONOSCOPY W/BIOPSY SINGLE/MULTIPLE N/A 02/12/2017    Procedure: COLONOSCOPY, FLEXIBLE, PROXIMAL TO SPLENIC FLEXURE; WITH BIOPSY, SINGLE OR MULTIPLE;  Surgeon: Rona Ravens, MD;  [facility]: [facility];  Service: Gastroenterology   ??? PR SIGMOIDOSCOPY FLX DX W/COLLJ SPEC BR/WA IF PFRMD N/A 06/21/2018    Procedure: SIGMOIDOSCOPY, FLEXIBLE; DIAGNOSTIC, WITH OR WITHOUT COLLECTION OF SPECIMEN(S) BY BRUSHING OR WASHING;  Surgeon: Cletis Athens, MD;  Location: [facility];  Service: Gastroenterology       No current facility-administered medications for this encounter.     Current Outpatient Medications:   ???  albuterol (PROVENTIL HFA;VENTOLIN HFA) 90 mcg/actuation inhaler, Inhale 2 puffs every four (4) hours as needed. , Disp: , Rfl:   ???  albuterol 2.5 mg /3 mL (0.083 %) nebulizer solution, Inhale 2.5 mg every four (4) hours as needed. , Disp: , Rfl:   ???  cholecalciferol, vitamin D3, (CHOLECALCIFEROL) 1,000 unit tablet, Take 1,000 Units by mouth daily. , Disp: , Rfl:   ???  EPINEPHrine (EPIPEN) 0.3 mg/0.3 mL injection, Inject 0.3 mg into the muscle., Disp: , Rfl:   ???  famotidine (PEPCID) 20 MG tablet, Take 1 tablet (20 mg total) by mouth Two (2) times a day., Disp: 60 tablet, Rfl: 0  ???  loratadine (CLARITIN) 10 mg tablet, Take 10 mg by mouth daily. , Disp: , Rfl:   ???  montelukast (SINGULAIR) 10 mg tablet, Take 10 mg by mouth nightly. , Disp: , Rfl:   ???  ondansetron (ZOFRAN) 4 MG tablet, Take 4 mg by mouth every eight (8) hours as needed for nausea., Disp: , Rfl:   ???  paliperidone (INVEGA) 3 MG 24 hr tablet, Take 3 mg by mouth daily at 10am., Disp: , Rfl:   ???  predniSONE (DELTASONE) 10 MG tablet, 60mg  x7days, 50mg  x7days, 40mg  x7days then decrease by 10mg  every 5days, Disp: 135 tablet, Rfl: 0  ???  propranolol (INDERAL) 10 MG tablet, TK 1 T PO TID, Disp: , Rfl: 2  ???  sertraline (ZOLOFT) 100 MG tablet, Take 75 mg by mouth daily. , Disp: , Rfl:   ???  sulfamethoxazole-trimethoprim (BACTRIM) 400-80 mg per tablet, Take 1 tablet (80 mg of trimethoprim total) by mouth daily. (Patient not taking: Reported on 09/13/2018), Disp: 36 tablet, Rfl: 0  ???  tofacitinib 10 mg Tab, Take 1 tablet (10 mg) by mouth Two (2) times a day., Disp: 60 tablet, Rfl: 3  ???  traZODone (DESYREL) 50 MG tablet, Take 50 mg by mouth nightly., Disp: , Rfl:   ???  tretinoin (RETIN-A) 0.025 % cream, Apply 1 application topically nightly., Disp: , Rfl:     Allergies  Infliximab; Nsaids (non-steroidal anti-inflammatory drug); Pineapple; Strawberry; Tolmetin; Tomato; Amoxicillin-pot clavulanate; and Omeprazole    Family History   Problem Relation Age of Onset   ??? Cancer Paternal Uncle         unknown, but has an ostomy   ??? Crohn's disease Paternal Aunt    ??? Cancer Maternal Uncle         colon   ??? Ulcerative colitis Neg Hx        Social History  Social History     Tobacco Use   ??? Smoking status: Never Smoker   ??? Smokeless tobacco: Never Used   Substance Use Topics   ??? Alcohol use: No   ??? Drug use: No       Review of Systems:  All other systems have been reviewed and are negative except as otherwise documented in the HPI.      Physical Exam     ED Triage Vitals [09/29/18 1409]   Enc Vitals Group      BP 111/64      Pulse       SpO2 Pulse       Resp 22      Temp 36.7 ??C (98.1 ??F)      Temp Source Oral      SpO2 99 %     Constitutional: In no acute distress.  Eyes: Conjunctivae are normal.  HEENT:       Head: Normocephalic and atraumatic.       Nose: No epistaxis or congestion.       Mouth/Throat: Clear oropharynx. Moist mucous membranes. No stertor.       Neck: full range of motion, no stridor.  Cardiovascular: Regular rate and rhythm. No murmurs, rubs or gallops. 2+ and symmetric radial pulses.  Respiratory: No increased work of breathing. Clear to auscultation bilaterally.  Gastrointestinal: Soft, non-tender, non-distended.  Rectal: / Genitourinary: Hemoccult positive. No hemherroids or masses  Musculoskeletal: No ecchymosis, cyanosis or edema.  Neurologic: Normal speech and language. No gross focal neurologic deficits are appreciated.  Skin: Skin is warm, dry and intact. No acute rash noted.  Psychiatric: Mood and affect are normal. Speech and behavior are normal.      Radiology     XR Abdomen 1 View   Final Result   Nonobstructive bowel gas pattern.            Documentation assistance was provided by Merwyn Katos, Scribe on September 29, 2018 at 2:10 PM for Larina Earthly, MD.    Documentation assistance was provided by the scribe in my presence.  The documentation recorded by the scribe has been reviewed by me and accurately reflects the services I personally performed.        Sanjuana Kava., MD  Resident  10/01/18 [phone]

## 2018-09-29 NOTE — Unmapped (Signed)
.  Patient rounding complete, call bell in reach, bed locked and in lowest position, patient belongings at bedside and within reach of patient.  Patient updated on plan of care.      Patient resting on the bed at this time.

## 2018-09-30 DIAGNOSIS — A0472 Enterocolitis due to Clostridium difficile, not specified as recurrent: Principal | ICD-10-CM

## 2018-09-30 LAB — CBC W/ AUTO DIFF
BASOPHILS ABSOLUTE COUNT: 0 10*9/L (ref 0.0–0.1)
BASOPHILS RELATIVE PERCENT: 0.1 %
EOSINOPHILS ABSOLUTE COUNT: 0 10*9/L (ref 0.0–0.4)
EOSINOPHILS RELATIVE PERCENT: 0.3 %
EOSINOPHILS RELATIVE PERCENT: 0.3 % — AB (ref 80.0–100.0)
HEMATOCRIT: 30.1 % — ABNORMAL LOW (ref 41.0–53.0)
HEMOGLOBIN: 10 g/dL — ABNORMAL LOW (ref 13.5–17.5)
LARGE UNSTAINED CELLS: 1 % (ref 0–4)
LYMPHOCYTES ABSOLUTE COUNT: 0.5 10*9/L — ABNORMAL LOW (ref 1.5–5.0)
LYMPHOCYTES RELATIVE PERCENT: 7.6 %
MEAN CORPUSCULAR HEMOGLOBIN CONC: 33 g/dL (ref 31.0–37.0)
MEAN CORPUSCULAR HEMOGLOBIN: 28.1 pg (ref 26.0–34.0)
MEAN PLATELET VOLUME: 8.4 fL (ref 7.0–10.0)
MONOCYTES ABSOLUTE COUNT: 0.2 10*9/L (ref 0.2–0.8)
MONOCYTES RELATIVE PERCENT: 3.1 %
NEUTROPHILS ABSOLUTE COUNT: 5.5 10*9/L (ref 2.0–7.5)
NEUTROPHILS RELATIVE PERCENT: 88.3 %
PLATELET COUNT: 371 10*9/L (ref 150–440)
RED BLOOD CELL COUNT: 3.55 10*12/L — ABNORMAL LOW (ref 4.50–5.90)
RED CELL DISTRIBUTION WIDTH: 16.3 % — ABNORMAL HIGH (ref 12.0–15.0)
WBC ADJUSTED: 6.2 10*9/L (ref 4.5–11.0)

## 2018-09-30 LAB — COMPREHENSIVE METABOLIC PANEL
ALKALINE PHOSPHATASE: 51 U/L (ref 38–126)
ALT (SGPT): 11 U/L (ref <50)
ANION GAP: 10 mmol/L (ref 7–15)
BILIRUBIN TOTAL: 0.2 mg/dL (ref 0.0–1.2)
BLOOD UREA NITROGEN: 4 mg/dL — ABNORMAL LOW (ref 7–21)
BLOOD UREA NITROGEN: 4 mg/dL — ABNORMAL LOW (ref 7–21)
BUN / CREAT RATIO: 5
CALCIUM: 9.5 mg/dL (ref 8.5–10.2)
CHLORIDE: 109 mmol/L — ABNORMAL HIGH (ref 98–107)
CO2: 21 mmol/L — ABNORMAL LOW (ref 22.0–30.0)
CREATININE: 0.75 mg/dL (ref 0.70–1.30)
EGFR CKD-EPI AA MALE: >90 mL/min/{1.73_m2} (ref >=60)
GLUCOSE RANDOM: 120 mg/dL (ref 70–179)
POTASSIUM: 4.1 mmol/L (ref 3.5–5.0)
PROTEIN TOTAL: 6.4 g/dL — ABNORMAL LOW (ref 6.5–8.3)
SODIUM: 140 mmol/L (ref 135–145)

## 2018-09-30 NOTE — Unmapped (Signed)
OCCUPATIONAL THERAPY  Evaluation(w/ PT Fernanda Drum 2/2 pt activity tolerance) (09/30/18 0956)    Patient Name:  Austin [name]       Medical Record Number: [id number]   Date of Birth: 07-26-1990  Sex: Male          OT Treatment Diagnosis:  no skilled acute OT needs      Assessment    Problem List: Other(none)    Clinical Decision Making: Low    Assessment: Patient is a multimorbid 28 y.o. 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 Difficile infection, asthma, GERD, seizure disorder, ADHD, developmental delay, bipolar disorder, baseline hearing impairment, and obesity (08/2018 BMI: 42.03) admitted 09/29/2018 with reports of three weeks of diarrhea and bloody stool suggestive of UC flare. Austin [name] presents with no skilled acute OT needs at this time. Pt completed functional bed mobility, functional t/f, LB dressing, and room-level functional mobility simulating distances between ADL environments. As pt did not demonstrate or endorse significant deficits impacting baseline ADL routines, he will be d/c from skilled acute OT services at this time. Please re-consult if needs arise. Based on consideration of pt's occupational profile, assessment review, level of clinical decision making involved, and intervention plan, this pt is considered to be a low complexity case. Anticipate no post-acute skilled OT services at this time.     Today's Interventions: Pt completed functional bed mobility, functional t/f, LB dressing, and room-level functional mobility simulating distances between ADL environments. Pt  Provided skilled education re: benefits of continued OOB activity/ADL participation to prevent deconditioning, safety/falls education in context of hospital environment, role of acute OT, and POC.    Activity Tolerance During Today's Session  Patient tolerated treatment well    Plan  Planned Frequency of Treatment:  D/C Services for: D/C Services       Planned Interventions:       Post-Discharge Occupational Therapy Recommendations:  OT Post Acute Discharge Recommendations: OT services not indicated   OT DME Recommendations: None    GOALS:   Patient and Family Goals: To return home       Prognosis:  Good  Positive Indicators:  PLOF, current level of function, support of family, receptive to education/participation with therapy   Barriers to Discharge: None    Subjective  Current Status pt received semi-reclined at bed level, and left seated at EOB, call bell in reach, all immediate needs met, RN Shawna Orleans updated and aware  Prior Functional Status Prior to recent admission, Mr.Hoyos reports independence with daily activities. He does not utilize AD for functional mobility, and denies recent falls. He does not drive, but reports family can drive him if needed. He spends most of his time watching TV.     Medical Tests / Procedures: reviewed        Patient / Caregiver reports: I sit around and watch TV.    Past Medical History:   Diagnosis Date   ??? ADHD    ??? Asthma    ??? Bipolar 1 disorder (CMS-HCC)    ??? Depression    ??? GERD (gastroesophageal reflux disease)    ??? HOH (hard of hearing)    ??? Hypertension    ??? Mental developmental delay    ??? Seizures (CMS-HCC)     last seizures 2016   ??? Ulcerative colitis (CMS-HCC)     Social History     Tobacco Use   ??? Smoking status: Never Smoker   ???  Smokeless tobacco: Never Used   Substance Use Topics   ??? Alcohol use: No      Past Surgical History:   Procedure Laterality Date   ??? ADENOIDECTOMY     ??? PR COLONOSCOPY FLX DX W/COLLJ SPEC WHEN PFRMD N/A 08/19/2017    Procedure: COLONOSCOPY, FLEXIBLE, PROXIMAL TO SPLENIC FLEXURE; DIAGNOSTIC, W/WO COLLECTION SPECIMEN BY BRUSH OR WASH;  Surgeon: Bluford Kaufmann, MD;  Location: [facility];  Service: Gastroenterology   ??? PR COLONOSCOPY W/BIOPSY SINGLE/MULTIPLE N/A 02/12/2017    Procedure: COLONOSCOPY, FLEXIBLE, PROXIMAL TO SPLENIC FLEXURE; WITH BIOPSY, SINGLE OR MULTIPLE;  Surgeon: Rona Ravens, MD;  Location: [facility];  Service: Gastroenterology   ??? PR SIGMOIDOSCOPY FLX DX W/COLLJ SPEC BR/WA IF PFRMD N/A 06/21/2018    Procedure: SIGMOIDOSCOPY, FLEXIBLE; DIAGNOSTIC, WITH OR WITHOUT COLLECTION OF SPECIMEN(S) BY BRUSHING OR WASHING;  Surgeon: Cletis Athens, MD;  Location: [facility];  Service: Gastroenterology    Family History   Problem Relation Age of Onset   ??? Cancer Paternal Uncle         unknown, but has an ostomy   ??? Crohn's disease Paternal Aunt    ??? Cancer Maternal Uncle         colon   ??? Ulcerative colitis Neg Hx         Infliximab; Nsaids (non-steroidal anti-inflammatory drug); Pineapple; Strawberry; Tolmetin; Tomato; Amoxicillin-pot clavulanate; and Omeprazole     Objective Findings  Precautions / Restrictions  Isolation precautions(enteric precautions)    Weight Bearing  Non-applicable    Required Braces or Orthoses  Non-applicable    Communication Preference  Verbal    Pain  pt denied pain at this time     Equipment / Environment  Vascular access (PIV, TLC, Port-a-cath, PICC)(masked donned )    Living Situation  Living Environment: House  Lives With: Mother;Grandparent  Home Living: Level entry;One level home;Tub/shower unit;Standard height toilet     Cognition   Orientation Level:  Oriented x 4   Arousal/Alertness:  Appropriate responses to stimuli   Attention Span:  Appears intact   Memory:  Appears intact   Following Commands:  Follows all commands and directions without difficulty   Safety Judgment:  Good awareness of safety precautions   Awareness of Errors:  Good awareness of safety precautions   Problem Solving:  Able to problem solve independently   Comments:      Vision / Perception    Hearing: Per chart review, pt with decreased hearing. Pt did require minimal verbal repetition potentially exacerbated by presence of surgical mask   Vision: No visual deficits  Perception: appears intact       Hand Function  Hand Dominance: R  WFL     Skin Inspection  visible skin appearedintact    ROM / Strength/Coordination  UE ROM/ Strength/ Coordination: BUE AROM/strength appeared grossly WFL   LE ROM/ Strength/ Coordination: BLE AROM/strength appeared grossly WFL     Sensation:  denied acute changes in sensation     Balance:  independent for dynamic sitting and dynamic standing     Functional Mobility  Transfer Assistance Needed: No(independent for sit<>stand without AD; independent for room-level functional mobility without AD simulating distances between ADL environments)  Bed Mobility Assistance Needed: No(mod I for sup with HOB slightly elevated>sitting EOB)      ADLs  ADLs: Independent      Vitals / Orthostatics  At Rest: NAD  With Activity: NAD  Orthostatics: asymptomatic  Medical Staff Made Aware: RN Shawna Orleans       Occupational Therapy Session Duration  OT Individual - Duration: 10         I attest that I have reviewed the above information.  Signed: Danielle Dess, OT  Filed 09/30/2018

## 2018-09-30 NOTE — Unmapped (Signed)
VSS and afebrile, no events overnight. PRN Tylenol given x1 for abd pain. NPO since MN. Enteric precautions maintained. Call light in reach.    Problem: Infection  Goal: Infection Symptom Resolution  Outcome: Ongoing - Unchanged     Problem: Adult Inpatient Plan of Care  Goal: Plan of Care Review  Outcome: Ongoing - Unchanged  Goal: Patient-Specific Goal (Individualization)  Outcome: Ongoing - Unchanged  Goal: Absence of Hospital-Acquired Illness or Injury  Outcome: Ongoing - Unchanged  Goal: Optimal Comfort and Wellbeing  Outcome: Ongoing - Unchanged  Goal: Readiness for Transition of Care  Outcome: Ongoing - Unchanged  Goal: Rounds/Family Conference  Outcome: Ongoing - Unchanged

## 2018-09-30 NOTE — Unmapped (Addendum)
Austin [name] is a multimorbid 28 y.o. 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 Difficile infection, asthma, GERD, history of seizure disorder, ADHD, developmental delay, bipolar disorder, baseline hearing impairment, and obesity (08/2018 BMI: 42.03) admitted 09/29/2018 with reports of three weeks of diarrhea and blood-streaked stool secondary to C. Difficile diarrhea.  ??  C Difficile infection (09/29/18 stool specimen):??Diarrhea streaked with blood reported x three weeks prior to 09/29/2018??admission in the setting of known ulcerative colitis. History of prior C difficile infection.??The patient is s/p IV SOLUMEDROL 40 mg in the ED on admission.??S/p IV LR at 100 cc/hr x 15 hrs. GI Path Panel canceled after positive C Diff resulted. No evidence of megacolon on 4/23 AM KUB. He was discharged on PO vancomycin QID which he should complete as an outpatient for a cumulative 14-day course (started 09/30/18) per recs from 09/30/18 GI consult note. Spoke with GI and with the patient's mother on date of discharge to coordinate plan; GI will schedule follow-up in Dr. Jonny Ruiz clinic.  ??  Ulcerative Colitis:??Follows with [facility], primary gastroenterologist appears to be Dr. Zetta Bills. Most recent prior visit with Dr. Raphael Gibney (telehealth) on 09/14/18, at which time his regimen consisted of XELJANZ 10 mg QD x 4 weeks, followed by 5 mg BID (prednisone tapered in preceding weeks towards goal of stopping altogether). Ost recent sigmoidoscopy 06/21/18 with severe (Mayo 3) colitis from anus to transverse colon, worse than on 08/19/17 colonoscopy. Diagnosed 2015 from colonoscopy after reported crampy abdominal pain, diarrhea, and hematochezia. UC refractory to prior regimens including (i) budesonide and mesalamine circa 2015, followed by (ii) infliximab (started 2017 with improvement, stopped in late 2018 after autoimmune hepatitis/DILI thought secondary to infliximab and earlier (03/2016 - 07/2016) period of prolonged C Difficile infection), (iii) Vedolizumab (ENTYVIO) and prednisone taper(stopped circa January 2020 after three flares, earlier 45-month gap in care, and then tofacitinib [name]) starting February 2020 with high dose prednisone taper.Patient did NOT have home XELJANZ while in the hospital, will resume as outpatient. He will continue BACTRIM prophylaxis. Patient, mother instructed to NOT resume steroids at 10/01/18 discharge pending guidance from Dr. Jonny Ruiz team.  ??  Asthma: Continued on home regimen including PRN albuterol, PO claritin, and PO montelukast (refill sent for montelukast at discharge per request from mother).  ??  Normocytic anemia: 09/29/18 Hemoglobin 10.6, MCV 84.5. Looking at prior months, MCV has been in the mid-80s with Hemoglobins in the 10.5 - 12.5 range since January 2020. 09/24/18 iron panel showed low-normal ferritin (47), normal TIBC (368), low iron saturation (13%).??Possibly multifactorial from GI losses in setting of weeks of loose stools, possible vitamin deficiencies from malabsoprtion. 09/29/18 Vitamin B12 - w/n/l at 835. Well-appearing and in no acute distress on 10/01/18 assessment, recommend follow-up as outpatient PRN.  ??  Mood disorder??/ Bipolar Disorder, etc.: Continued on outpatient regimen including PO paliperidone, PO sertraline, PO trazodone.  ??  History of seizure:  Per 10/01/18 AM conversation with the patient's mother, no history of seizure reported since circa 2017. No on any scheduled anti-epileptics.

## 2018-09-30 NOTE — Unmapped (Signed)
PHYSICAL THERAPY  Evaluation (09/30/18 0955)     Patient Name:  [place] [name]       Medical Record Number: [id number]   Date of Birth: 12/13/1990  Sex: Male            Treatment Diagnosis: Deconditioning    ASSESSMENT        Assessment : Patient is a multimorbid 28 y.o. 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 Difficile infection, asthma, GERD, seizure disorder, ADHD, developmental delay, bipolar disorder, baseline hearing impairment, and obesity (08/2018 BMI: 42.03) admitted 09/29/2018 with reports of three weeks of diarrhea and bloody stool suggestive of UC flare. Patient presents today with ability to perform transfers and ambulate independently with no device, and as such does not require further skilled acute PT intervention. Patient will benefit from daily ambulation with RN staff and increased time OOB to chair. Based on the AM-PAC five item raw score of 20/20, the patient is considered to be 0.00% impaired with basic mobility. This indicates that no PT f/u is currently indicated at this time. After a review of the personal factors, comorbidities, clinical presentation, and examination of the number of affected body systems, the patient presents as a low complexity case.     Today's Interventions: Patient educated on role of PT, PT POC, importance of OOB mobility while in house and having increased time OOB to chair                          PLAN  Planned Frequency of Treatment:  D/C Services for: D/C Services      Planned Interventions:      Post-Discharge Physical Therapy Recommendations:  PT services not indicated    PT DME Recommendations: None           Goals:   Patient and Family Goals: None stated                                                                                                        Prognosis:  Excellent  Positive Indicators: PLOF, age, participation, reported caregiver support  Barriers to Discharge: None    SUBJECTIVE  Patient reports: Patient and RN agreeable to PT, patient reports that he has been moving around the room some  Current Functional Status: Patient received supine in bed, ended session sitting upright EOB, lines intact, needs within reach, RN aware.      Prior Functional Status: Prior to admission, patient reports ambulating independently with no device, denies any recent falls  Equipment available at home: None     Past Medical History:   Diagnosis Date   ??? ADHD    ??? Asthma    ??? Bipolar 1 disorder (CMS-HCC)    ??? Depression    ??? GERD (gastroesophageal reflux disease)    ??? HOH (hard of hearing)    ??? Hypertension    ??? Mental developmental delay    ??? Seizures (CMS-HCC)     last seizures  2016   ??? Ulcerative colitis (CMS-HCC)     Social History     Tobacco Use   ??? Smoking status: Never Smoker   ??? Smokeless tobacco: Never Used   Substance Use Topics   ??? Alcohol use: No      Past Surgical History:   Procedure Laterality Date   ??? ADENOIDECTOMY     ??? PR COLONOSCOPY FLX DX W/COLLJ SPEC WHEN PFRMD N/A 08/19/2017    Procedure: COLONOSCOPY, FLEXIBLE, PROXIMAL TO SPLENIC FLEXURE; DIAGNOSTIC, W/WO COLLECTION SPECIMEN BY BRUSH OR WASH;  Surgeon: Bluford Kaufmann, MD;  [facility];  Service: Gastroenterology   ??? PR COLONOSCOPY W/BIOPSY SINGLE/MULTIPLE N/A 02/12/2017    Procedure: COLONOSCOPY, FLEXIBLE, PROXIMAL TO SPLENIC FLEXURE; WITH BIOPSY, SINGLE OR MULTIPLE;  Surgeon: Rona Ravens, MD;  [facility];  Service: Gastroenterology   ??? PR SIGMOIDOSCOPY FLX DX W/COLLJ SPEC BR/WA IF PFRMD N/A 06/21/2018    Procedure: SIGMOIDOSCOPY, FLEXIBLE; DIAGNOSTIC, WITH OR WITHOUT COLLECTION OF SPECIMEN(S) BY BRUSHING OR WASHING;  Surgeon: Cletis Athens, MD;  [facility];  Service: Gastroenterology    Family History   Problem Relation Age of Onset   ??? Cancer Paternal Uncle         unknown, but has an ostomy   ??? Crohn's disease Paternal Aunt    ??? Cancer Maternal Uncle         colon   ??? Ulcerative colitis Neg Hx         Allergies: Infliximab; Nsaids (non-steroidal anti-inflammatory drug); Pineapple; Strawberry; Tolmetin; Tomato; Amoxicillin-pot clavulanate; and Omeprazole                Objective Findings  Precautions / Restrictions  Precautions: Isolation precautions(Enteric precautions)  Weight Bearing Status: Non-applicable  Required Braces or Orthoses: Non-applicable    Communication Preference: Verbal   Pain Comments: Patient denies pain  Medical Tests / Procedures: Labs, orders, and imaging reviewed in Epic.   Equipment / Environment: Vascular access (PIV, TLC, Port-a-cath, PICC)(mask donned)    At Rest: VSS per Epic  With Activity: Pt NAD          Living Situation  Living Environment: House  Lives With: Mother;Grandparent  Home Living: Level entry;One level home(pt inconsistent with answering questions for home environment)     Cognition: Patient follows commands appropriately, primarily communicates via yes/no answers and is inconsistent with some responses regarding home environment     Skin Inspection: Visible skin intact    UE ROM: WFL  UE Strength: WFL  LE ROM: WFL  LE Strength: WFL                          Balance: Patient sits unsupported EOB independently; patient stands with no device independently, able to perform 180 degree turns without LOB         Bed Mobility: Patient transitions supine to sit independently without use of bedrails  Transfers: Patient transfers sit to/from stand independently with no device, pt steady upon assuming initial standing position   Gait  Level of Assistance: Independent  Assistive Device: None  Distance Ambulated (ft): 25 ft  Gait: Patient ambulates 25 ft independently with no device, steady gait with reciprocal pattern with no episodes of LOB with 180 degree turns  Stairs: Not assessed      Endurance: Intact, patient denies fatigue and/or SOB with ambulation    Physical Therapy Session Duration PT Individual - Duration:  10    Medical Staff Made Aware: RN Shawna Orleans aware of session outcome    I attest that I have reviewed the above information.  Signed: Fernanda Drum, PT  Filed 09/30/2018

## 2018-09-30 NOTE — Unmapped (Signed)
General Medicine History and Physical    Assessment/Plan:    Principal Problem:    Diarrhea  Active Problems:    Ulcerative colitis (CMS-HCC)    Bipolar 1 disorder (CMS-HCC)    Asthma    ADHD (attention deficit hyperactivity disorder)    History of seizure    Normocytic anemia      [name] is a multimorbid 28 y.o. 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 Difficile infection, asthma, GERD, seizure disorder, ADHD, developmental delay, bipolar disorder, baseline hearing impairment, and obesity (08/2018 BMI: 42.03) admitted 09/29/2018 with reports of three weeks of diarrhea and bloody stool suggestive of UC flare.    Diarrhea / Bloody stool: Symptoms reported x three weeks prior to 09/29/2018 admission in the setting of known ulcerative colitis. Concern for possible UC flare. Active alternatives could include infectious processes though patient was afebrile on admission and normotensive. The patient is s/p IV SOLUMEDROL 40 mg in the ED on admission.   - IV SOLUMEDROL Q8H   - NPO @ 0001 4/23 for planned flexible sigmoidoscopy and CMV biopsy with [name]  - 4/22: IV LR at 100 cc/hr x 15 hrs  [ ]  FOR 4/22 overnight: If patient with acute abdomen or hemodynamic instability, please order STAT KUB and page overnight [name] fellow (aware of admission)  [ ]  f/u C Difficile  [ ]  f/u [name] Pathogen Panel  [ ]  f/u [name] consult for flex sig and CMV biopsy (ordered 4/22 PM); per 4/21 telephone note from [name], the patient may need to be on Ustekinumab going forward, and may need flexible sigmoidoscopy +/- urgent colectomy for his medically refractory colitis  ??  Ulcerative Colitis: Follows with [place] [name], primary gastroenterologist appears to be Dr. Zetta [name]. Most recent prior visit with [name] (telehealth) on 09/14/18, at which time his regimen consisted of XELJANZ 10 mg QD x 4 weeks, followed by 5 mg BID (prednisone tapered in preceding weeks towards goal of stopping altogether). Ost recent sigmoidoscopy 06/21/18 with severe ([facility] 3) colitis from anus to transverse colon, worse than on 08/19/17 colonoscopy. Diagnosed 2015 from colonoscopy after reported crampy abdominal pain, diarrhea, and hematochezia. UC refractory to prior regimens including (i) budesonide and mesalamine circa 2015, followed by (ii) infliximab (started 2017 with improvement, stopped in late 2018 after autoimmune hepatitis/DILI thought secondary to infliximab and earlier (03/2016 - 07/2016) period of prolonged C Difficile infection), (iii) Vedolizumab (ENTYVIO) and prednisone taper(stopped circa January 2020 after three flares, earlier 16-month gap in care, and then tofacitinib [name]) starting February 2020 with high dose prednisone taper.  - Holding XELJANZ  - Continue BACTRIM prophylaxis  [ ]  FOR D/C: Patient should have  [name] follow-up, preferably in [name]     ??  Asthma:  - PRN albuterol  - PO claritin  - PO montelukast  ??  Normocytic anemia: 09/29/18 Hemoglobin 10.6, MCV 84.5. Looking at prior months, MCV has been in the mid-80s with Hemoglobins in the 10.5 - 12.5 range since January 2020. 09/24/18 iron panel showed low-normal ferritin (47), normal TIBC (368), low iron saturation (13%). Possibly multifactorial from [name] losses in setting of weeks of loose stools, possible vitamin deficiencies from malabsoprtion  - Daily CBC  [ ]  f/u 09/29/18 Folic acid  [ ]  f/u 09/29/18 Vitamin B12  ??  Mood disorder / Bipolar Disorder, etc.:  - PO paliperidone  - PO sertraline  - PO  trazodone    History of seizure:  [ ]  FOR 4/23: Discuss history further with patient; does not appear to be on any scheduled anti-epileptics per preliminary review of home medications    Code Status:  Full Code    Dispo: Med W, floor  ___________________________________________    Chief Complaint  Chief Complaint   Patient presents with   ??? Diarrhea       HPI:  Austin [name] is a multimorbid 28 y.o. 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 Difficile infection, asthma, GERD, seizure disorder, ADHD, developmental delay, bipolar disorder, baseline hearing impairment, and obesity (08/2018 BMI: 42.03) admitted 09/29/2018 with reports of three weeks of diarrhea and bloody stool suggestive of UC flare. History is primarily gathered from prior notes.  ??  The patient's mother contacted [place] [name] on 09/27/18 reporting that [place] [name] was having as many as ten bowel movements per day with occasional streaks of blood accompanied by abdominal cramping. He had reportedly tapered completed off prednisone as instructed at his appointment with his primary gastroenterologist (Dr. Zetta [name]) earlier in April 2020 and was only of tofacitinib 10 mg BID as part of a planned four-week course ahead of taper down to 5 mg BID for four weeks. No new medications, over-the-counter treatments, supplements or antibiotics had been reported. No sick contacts were reported. [name] spoke with the patient's mother on 09/28/18 PM and recommended that the patient come to [place] [name] in [name] for inpatient management.   ??  On arrival at [place] [name], the patient was afebrile with BP in the 110s/60s and satting 100% on room air. He received a dose of IV SOLUMEDROL around 1440. On ED triage, he was in no acute distress and reportedly comfortable appearing with non-tender abdominal exam.    Allergies:  Infliximab; Nsaids (non-steroidal anti-inflammatory drug); Pineapple; Strawberry; Tolmetin; Tomato; Amoxicillin-pot clavulanate; and Omeprazole    Medications:   Prior to Admission medications    Medication Dose, Route, Frequency   albuterol (PROVENTIL HFA;VENTOLIN HFA) 90 mcg/actuation inhaler 2 puffs, Inhalation, Every 4 hours PRN   albuterol 2.5 mg /3 mL (0.083 %) nebulizer solution 2.5 mg, Inhalation, Every 4 hours PRN cholecalciferol, vitamin D3, (CHOLECALCIFEROL) 1,000 unit tablet 1,000 Units, Oral, Daily (standard)   EPINEPHrine (EPIPEN) 0.3 mg/0.3 mL injection 0.3 mg, Intramuscular   loratadine (CLARITIN) 10 mg tablet 10 mg, Oral, Daily (standard)   montelukast (SINGULAIR) 10 mg tablet 10 mg, Oral, Nightly   ondansetron (ZOFRAN) 4 MG tablet 4 mg, Oral, Every 8 hours PRN   paliperidone (INVEGA) 3 MG 24 hr tablet 3 mg, Oral, Daily   predniSONE (DELTASONE) 10 MG tablet 60mg  x7days, 50mg  x7days, 40mg  x7days then decrease by 10mg  every 5days   propranolol (INDERAL) 10 MG tablet TK 1 T PO TID   sertraline (ZOLOFT) 100 MG tablet 75 mg, Oral, Daily (standard)   sulfamethoxazole-trimethoprim (BACTRIM) 400-80 mg per tablet 1 tablet, Oral, Daily   tofacitinib 10 mg Tab 10 mg, Oral, 2 times a day (standard)   traZODone (DESYREL) 50 MG tablet 50 mg, Oral, Nightly   tretinoin (RETIN-A) 0.025 % cream 1 application, Topical, Nightly   famotidine (PEPCID) 20 MG tablet 20 mg, Oral, 2 times a day (standard)       Medical History:  Past Medical History:   Diagnosis Date   ??? ADHD    ??? Asthma    ??? Bipolar 1 disorder (CMS-HCC)    ???  Depression    ??? GERD (gastroesophageal reflux disease)    ??? HOH (hard of hearing)    ??? Hypertension    ??? Mental developmental delay    ??? Seizures (CMS-HCC)     last seizures 2016   ??? Ulcerative colitis (CMS-HCC)        Surgical History:  Past Surgical History:   Procedure Laterality Date   ??? ADENOIDECTOMY     ??? PR COLONOSCOPY FLX DX W/COLLJ SPEC WHEN PFRMD N/A 08/19/2017    Procedure: COLONOSCOPY, FLEXIBLE, PROXIMAL TO SPLENIC FLEXURE; DIAGNOSTIC, W/WO COLLECTION SPECIMEN BY BRUSH OR WASH;  Surgeon: Bluford Kaufmann, MD;  [name];  Service: Gastroenterology   ??? PR COLONOSCOPY W/BIOPSY SINGLE/MULTIPLE N/A 02/12/2017    Procedure: COLONOSCOPY, FLEXIBLE, PROXIMAL TO SPLENIC FLEXURE; WITH BIOPSY, SINGLE OR MULTIPLE;  Surgeon: Rona Ravens, MD;  [name];  Service: Gastroenterology   ??? PR SIGMOIDOSCOPY FLX DX W/COLLJ SPEC BR/WA IF PFRMD N/A 06/21/2018    Procedure: SIGMOIDOSCOPY, FLEXIBLE; DIAGNOSTIC, WITH OR WITHOUT COLLECTION OF SPECIMEN(S) BY BRUSHING OR WASHING;  Surgeon: Cletis Athens, MD;  [name];  Service: Gastroenterology       Social History:  Tobacco use:   reports that he has never smoked. He has never used smokeless tobacco.  Alcohol use:   reports no history of alcohol use.  Drug use:  reports no history of drug use.  Living situation: the patient lives with their family.    Family History:  Family History   Problem Relation Age of Onset   ??? Cancer Paternal Uncle         unknown, but has an ostomy   ??? Crohn's disease Paternal Aunt    ??? Cancer Maternal Uncle         colon   ??? Ulcerative colitis Neg Hx        Review of Systems:  10 systems reviewed and are negative unless otherwise mentioned in HPI      Physical Exam:  Temp:  [36.7 ??C-37.3 ??C] 37.3 ??C  Heart Rate:  [84-88] 84  Resp:  [17-22] 18  BP: (111-131)/(59-79) 123/59  SpO2:  [96 %-100 %] 96 %  Body mass index is 42.52 kg/m??.    Constitutional:??Obese, bearded male in no acute distress lying supine, watching television  Eyes:??Conjunctivae are normal.  HEENT:  ??????????Head:??Normocephalic and atraumatic.  ??????????Nose: No epistaxis or congestion.  ??????????Mouth/Throat:??Clear oropharynx. Moist mucous membranes. No stertor.  ??????????Neck:??full range of motion, no stridor.  Cardiovascular:??Regular rate and rhythm. No murmurs, rubs or gallops. 2+ and symmetric radial pulses.  Respiratory:??No increased work of breathing. Clear to auscultation bilaterally.  Gastrointestinal:??Soft, non-tender, distension consistent with habitus  Rectal: / Genitourinary:??Deferred  Musculoskeletal:??No ecchymosis, cyanosis or edema.  Neurologic: Normal speech and language. No gross focal neurologic deficits are appreciated.  Skin:??Skin is warm, dry and intact. No acute rash noted.  Psychiatric: Mood and affect are normal. Speech and behavior are normal.    Test Results:  Data Review:    All lab results last 24 hours:    Recent Results (from the past 24 hour(s))   Comprehensive Metabolic Panel    Collection Time: 09/29/18  2:38 PM   Result Value Ref Range    Sodium 141 135 - 145 mmol/L    Potassium 3.8 3.5 - 5.0 mmol/L    Chloride 108 (H) 98 - 107 mmol/L    Anion Gap 13 7 - 15 mmol/L    CO2  20.0 (L) 22.0 - 30.0 mmol/L    BUN 4 (L) 7 - 21 mg/dL    Creatinine 1.61 0.96 - 1.30 mg/dL    BUN/Creatinine Ratio 5     EGFR CKD-EPI Non-African American, Male >90 >=60 mL/min/1.30m2    EGFR CKD-EPI African American, Male >90 >=60 mL/min/1.47m2    Glucose 108 70 - 179 mg/dL    Calcium 9.6 8.5 - 04.5 mg/dL    Albumin 3.8 3.5 - 5.0 g/dL    Total Protein 7.0 6.5 - 8.3 g/dL    Total Bilirubin 0.2 0.0 - 1.2 mg/dL    AST 22 19 - 55 U/L    ALT 12 <50 U/L    Alkaline Phosphatase 51 38 - 126 U/L   Type and Screen    Collection Time: 09/29/18  2:38 PM   Result Value Ref Range    Check Type Needed Type Check     ABO Grouping O POS     Antibody Screen POS    CBC w/ Differential    Collection Time: 09/29/18  2:38 PM   Result Value Ref Range    WBC 7.5 4.5 - 11.0 10*9/L    RBC 3.94 (L) 4.50 - 5.90 10*12/L    HGB 10.6 (L) 13.5 - 17.5 g/dL    HCT 40.9 (L) 81.1 - 53.0 %    MCV 84.5 80.0 - 100.0 fL    MCH 27.0 26.0 - 34.0 pg    MCHC 32.0 31.0 - 37.0 g/dL    RDW 91.4 (H) 78.2 - 15.0 %    MPV 8.0 7.0 - 10.0 fL    Platelet 395 150 - 440 10*9/L    Neutrophils % 76.0 %    Lymphocytes % 14.5 %    Monocytes % 5.6 %    Eosinophils % 2.1 %    Basophils % 0.2 %    Neutrophil Left Shift 1+ (A) Not Present    Absolute Neutrophils 5.7 2.0 - 7.5 10*9/L    Absolute Lymphocytes 1.1 (L) 1.5 - 5.0 10*9/L    Absolute Monocytes 0.4 0.2 - 0.8 10*9/L    Absolute Eosinophils 0.2 0.0 - 0.4 10*9/L    Absolute Basophils 0.0 0.0 - 0.1 10*9/L    Large Unstained Cells 2 0 - 4 %    Microcytosis Slight (A) Not Present    Anisocytosis Slight (A) Not Present   Phosphorus Level Collection Time: 09/29/18  2:38 PM   Result Value Ref Range    Phosphorus 4.3 2.9 - 4.7 mg/dL   C-reactive protein    Collection Time: 09/29/18  2:38 PM   Result Value Ref Range    CRP 19.1 (H) <10.0 mg/L   Vitamin B12 Level    Collection Time: 09/29/18  2:38 PM   Result Value Ref Range    Vitamin B-12 835 193 - 900 pg/ml       Imaging: No results found.    EKG: N/A

## 2018-09-30 NOTE — Unmapped (Signed)
GASTROENTEROLOGY INPATIENT CONSULTATION H&P      Requesting Attending Physician:  Deeann Cree, MD  Requesting Consult Service: Med General Filiberto Pinks (MDW)    Reason for Consult:    [name] is a 28 y.o. male seen in consultation at the request of Dr. Deeann Cree, MD for colitis.    Assessment and Recommendations:   Austin [name] is a 28 y.o. male with a hx notable for ADHD, Seizure disorder, Developmental delay, Bipolar disorder, hard of hearing and GERD.  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  He had mod-severe colitis flare fall 2019 that did not respond to Vedolizumab re-load and prednisone.  [name] and  prednisone taper was started Feb 2020 with good response. Over the last week he has had increased diarrhea and abdominal cramping and here his c diff is positive with reassuring labs and KUB. We would recommend treatment of c diff with PO vancomycin x 14 days, continuing xeljanz, and holding off on steroid therapy. If he does not improve in the next 48 hours we will consider flexible sigmoidoscopy. Regardless of his c diff, he has medically refractory UC and his recent colitis could be driven by his UC primarily rather than the c diff so we will remain vigilant.     - continue Xeljanz 10 BID  - Hold steroid therapy, start vancomycin therapy for c diff x 14 days  - daily CMP, CBC, CRP  - Monitor clinical course in next 48 hours and likely flex sig if no improvement    Thank you for this consult. We will continue to follow along. Please page the GI [facility] pager at with any further questions.      Rutherford Limerick, MD  Fellow, PGY-4  [employer] [facility]  Division of Gastroenterology & Hepatology    History of Present Illness:      Chief Complaint: diarrhea    HPI:   This is a 28 y.o. male with a history of ulcerative colitis, seizure disorder, bipolar disorder, developmental delay who presents with worsening abdominal pain and diarrhea.    He follows with Dr. Raphael Gibney and was originally diagnosed back in 2015 with diarrhea and hematochezia.  He was initially prescribed budesonide and mesalamine which did not work and then he was off medications for 1 to 2 years after that.  In 2017 he was started on infliximab with improvement but his course was complicated by C. difficile.  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  In 2019 he was off vedolizumab and had secondary loss of response.  He was admitted to [facility] January 2020 with severe colitis and got IV steroids with improvement and was transitioned to [place] 10 mg twice daily February 2020.  His last flexible sigmoidoscopy was June 21, 2018 with severe Mayo 3 colitis from the anus to the transverse colon.    He was last seen by Dr. Raphael Gibney on 4/7 where he was noted to have good response to [facility] with plans to drop Xeljanz dose to 10 mg twice daily in early May.  The patient had worsening diarrhea with 10 bowel movements per day and urgency with no blood over the last several days and Dr. Raphael Gibney recommended  admission to the emergency room.  Dr. Raphael Gibney recommended C. difficile testing, flex sig to assess colitis and biopsy for CMV, initiation of IV steroids and GI surgery consult.  In the ED he was afebrile, non-tachycardic with a stable blood pressure.  His lab work-up was notable for a white count of 7.5, hemoglobin 10.6, platelets 395, bicarb 20, creatinine 0.8, normal LFTs and a CRP of 19.  His C. difficile PCR returned positive.A KUB from today is pending.    Review of Systems:  The balance of 12 systems reviewed is negative except as noted in the HPI.     Medical History:   Allergies:  Infliximab; Nsaids (non-steroidal anti-inflammatory drug); Pineapple; Strawberry; Tolmetin; Tomato; Amoxicillin-pot clavulanate; and Omeprazole    Medications:   Prior to Admission medications    Medication Dose, Route, Frequency   albuterol (PROVENTIL HFA;VENTOLIN HFA) 90 mcg/actuation inhaler 2 puffs, Inhalation, Every 4 hours PRN   albuterol 2.5 mg /3 mL (0.083 %) nebulizer solution 2.5 mg, Inhalation, Every 4 hours PRN   cholecalciferol, vitamin D3, (CHOLECALCIFEROL) 1,000 unit tablet 1,000 Units, Oral, Daily (standard)   EPINEPHrine (EPIPEN) 0.3 mg/0.3 mL injection 0.3 mg, Intramuscular   loratadine (CLARITIN) 10 mg tablet 10 mg, Oral, Daily (standard)   montelukast (SINGULAIR) 10 mg tablet 10 mg, Oral, Nightly   ondansetron (ZOFRAN) 4 MG tablet 4 mg, Oral, Every 8 hours PRN   paliperidone (INVEGA) 3 MG 24 hr tablet 3 mg, Oral, Daily   predniSONE (DELTASONE) 10 MG tablet 60mg  x7days, 50mg  x7days, 40mg  x7days then decrease by 10mg  every 5days   propranolol (INDERAL) 10 MG tablet TK 1 T PO TID   sertraline (ZOLOFT) 100 MG tablet 75 mg, Oral, Daily (standard)   sulfamethoxazole-trimethoprim (BACTRIM) 400-80 mg per tablet 1 tablet, Oral, Daily   tofacitinib 10 mg Tab 10 mg, Oral, 2 times a day (standard)   traZODone (DESYREL) 50 MG tablet 50 mg, Oral, Nightly   tretinoin (RETIN-A) 0.025 % cream 1 application, Topical, Nightly   famotidine (PEPCID) 20 MG tablet 20 mg, Oral, 2 times a day (standard)       Medical History:  Past Medical History:   Diagnosis Date   ??? ADHD    ??? Asthma    ??? Bipolar 1 disorder (CMS-HCC)    ??? Depression    ??? GERD (gastroesophageal reflux disease)    ??? HOH (hard of hearing)    ??? Hypertension    ??? Mental developmental delay    ??? Seizures (CMS-HCC)     last seizures 2016   ??? Ulcerative colitis (CMS-HCC)        Surgical History:  Past Surgical History:   Procedure Laterality Date   ??? ADENOIDECTOMY     ??? PR COLONOSCOPY FLX DX W/COLLJ SPEC WHEN PFRMD N/A 08/19/2017    Procedure: COLONOSCOPY, FLEXIBLE, PROXIMAL TO SPLENIC FLEXURE; DIAGNOSTIC, W/WO COLLECTION SPECIMEN BY BRUSH OR WASH;  Surgeon: Bluford Kaufmann, MD;  [facility];  Service: Gastroenterology   ??? PR COLONOSCOPY W/BIOPSY SINGLE/MULTIPLE N/A 02/12/2017    Procedure: COLONOSCOPY, FLEXIBLE, PROXIMAL TO SPLENIC FLEXURE; WITH BIOPSY, SINGLE OR MULTIPLE;  Surgeon: Rona Ravens, MD;  Location: [facility];  Service: Gastroenterology   ??? PR SIGMOIDOSCOPY FLX DX W/COLLJ SPEC BR/WA IF PFRMD N/A 06/21/2018    Procedure: SIGMOIDOSCOPY, FLEXIBLE; DIAGNOSTIC, WITH OR WITHOUT COLLECTION OF SPECIMEN(S) BY BRUSHING OR WASHING;  Surgeon: Cletis Athens, MD;  [facility];  Service: Gastroenterology  Social History:  Tobacco use:   reports that he has never smoked. He has never used smokeless tobacco.  Alcohol use:   reports no history of alcohol use.  Drug use:  reports no history of drug use.    Family History:  Family History   Problem Relation Age of Onset   ??? Cancer Paternal Uncle         unknown, but has an ostomy   ??? Crohn's disease Paternal Aunt    ??? Cancer Maternal Uncle         colon   ??? Ulcerative colitis Neg Hx        Review of Systems:  10 systems reviewed and are negative unless otherwise mentioned in HPI    Objective:    Vital Signs/Weight:  Temp:  [36.1 ??C-37.3 ??C] 36.1 ??C  Heart Rate:  [83-93] 83  Resp:  [17-22] 18  BP: (111-150)/(59-79) 130/59  SpO2:  [96 %-100 %] 98 %  Wt Readings from Last 3 Encounters:   09/29/18 (!) 115.9 kg (255 lb 8 oz)   08/03/18 (!) 114.6 kg (252 lb 9.6 oz)   06/29/18 (!) 116.1 kg (256 lb)       Physical Exam:  Constitutional: Pleasant young male resting comfortably in bed, appears well  HEENT: Anicteric sclera  CV: Regular rate and rhythm  Lung: Clear to auscultation bilaterally. Unlabored breathing.   Abdomen: Soft, no significant tenderness to palpation, no rebound, no guarding.   Extremities: No edema, well perfused  MSK: No joint swelling or tenderness noted, no deformities  Skin: No rashes, jaundice  Neuro: No focal deficits. No asterixis.   Mental Status: Alert and oriented to person place and time    Diagnostic Studies:  I reviewed all pertinent diagnostic studies, including:      Labs:    Recent Labs     09/29/18  1438 09/30/18  0610   WBC 7.5 6.2   HGB 10.6* 10.0*   HCT 33.3* 30.1*   PLT 395 371     Recent Labs     09/29/18  1438 09/30/18  0610   NA 141 140   K 3.8 4.1   CL 108* 109*   BUN 4* 4*   CREATININE 0.80 0.75   GLU 108 120     Recent Labs     09/29/18  1438 09/30/18  0610   PROT 7.0 6.4*   ALBUMIN 3.8 3.4*   AST 22 16*   ALT 12 11   ALKPHOS 51 51   BILITOT 0.2 0.2     Recent Labs     09/30/18  0610   INR 1.23     Recent Labs     09/29/18  1438 09/30/18  0610   CRP 19.1* 19.7*       Lab Results   Component Value Date    SMOOTHMUSCAB Negative 01/26/2017    IGG 1,338 01/26/2017    MITOAB Positive (A) 01/26/2017       Imaging:   EXAM: XR ABDOMEN 1 VIEW  DATE: 09/30/2018 8:37 AM  ACCESSION: [id number]  DICTATED: 09/30/2018 8:52 AM  INTERPRETATION LOCATION: [facility]  ??  CLINICAL INDICATION: 28 year old Male with DIARRHEA    ??  COMPARISON: CT abdomen and pelvis 06/17/2018  ??  TECHNIQUE: Supine views of the abdomen.  ??  FINDINGS:   There is normal gas distribution throughout the nondistended colon, as well as a few scattered loops of small bowel. No significant colonic stool  burden.  ??  No abnormal calcifications or bowel displacement. The visualized lung bases are clear.  ??  IMPRESSION:  Nonobstructive bowel gas pattern.    GI Procedures:   06/2018 flex sig    Findings:       The perianal and digital rectal examinations were normal.       Inflammation characterized by congestion (edema), erosions, erythema,        friability, loss of vascularity and mucus was found in a continuous and        circumferential pattern from the anus to the transverse colon (proximal        extent of exam). No sites were spared. This was severe and graded as        Mayo Score 3 (severe, with spontaneous bleeding, ulcerations), and when        compared to previous examinations, the findings are worsened.       Retroflexion not performed due to severe degree of inflammation                                                                                   Impression:        - Ulcerative colitis. Inflammation was found from the anus                      to the transverse colon. This was severe and graded as                      Mayo Score 3 (severe disease), worsened compared to                      previous examinations.                     - No specimens collected.

## 2018-09-30 NOTE — Unmapped (Signed)
Pt arrived from the ED this evening. Alert and oriented x4. Afebrile with stable VS. Denies pain. Pt states that his last bloody stool was this morning. Awaiting next BM to collect stool sample. LR infusing per order. NPO diet in place. Enteric precautions maintained for r/o c. diff. No falls. Will continue to monitor.     Problem: Adult Inpatient Plan of Care  Goal: Plan of Care Review  Outcome: Progressing  Goal: Patient-Specific Goal (Individualization)  Outcome: Progressing  Goal: Absence of Hospital-Acquired Illness or Injury  Outcome: Progressing  Goal: Optimal Comfort and Wellbeing  Outcome: Progressing  Goal: Readiness for Transition of Care  Outcome: Progressing  Goal: Rounds/Family Conference  Outcome: Progressing     Problem: Infection  Goal: Infection Symptom Resolution  Outcome: Progressing

## 2018-10-01 LAB — COMPREHENSIVE METABOLIC PANEL
ALBUMIN: 3 g/dL — ABNORMAL LOW (ref 3.5–5.0)
ALKALINE PHOSPHATASE: 73 U/L (ref 38–126)
ALT (SGPT): 8 U/L (ref <50)
ANION GAP: 13 mmol/L (ref 7–15)
AST (SGOT): 13 U/L — ABNORMAL LOW (ref 19–55)
BLOOD UREA NITROGEN: 6 mg/dL — ABNORMAL LOW (ref 7–21)
BUN / CREAT RATIO: 7
CALCIUM: 8.9 mg/dL (ref 8.5–10.2)
CHLORIDE: 110 mmol/L — ABNORMAL HIGH (ref 98–107)
CO2: 20 mmol/L — ABNORMAL LOW (ref 22.0–30.0)
CREATININE: 0.87 mg/dL (ref 0.70–1.30)
EGFR CKD-EPI AA MALE: >90 mL/min/{1.73_m2} (ref >=60)
EGFR CKD-EPI NON-AA MALE: >90 mL/min/{1.73_m2} (ref >=60)
GLUCOSE RANDOM: 93 mg/dL (ref 70–179)
POTASSIUM: 3.5 mmol/L (ref 3.5–5.0)
PROTEIN TOTAL: 5.9 g/dL — ABNORMAL LOW (ref 6.5–8.3)
SODIUM: 143 mmol/L (ref 135–145)

## 2018-10-01 LAB — CBC W/ AUTO DIFF
BASOPHILS ABSOLUTE COUNT: 0 10*9/L (ref 0.0–0.1)
BASOPHILS RELATIVE PERCENT: 0.2 %
EOSINOPHILS ABSOLUTE COUNT: 0.2 10*9/L (ref 0.0–0.4)
EOSINOPHILS RELATIVE PERCENT: 2.9 %
HEMATOCRIT: 28.9 % — ABNORMAL LOW (ref 41.0–53.0)
HEMOGLOBIN: 9.6 g/dL — ABNORMAL LOW (ref 13.5–17.5)
LARGE UNSTAINED CELLS: 2 % (ref 0–4)
LYMPHOCYTES ABSOLUTE COUNT: 1.3 10*9/L — ABNORMAL LOW (ref 1.5–5.0)
LYMPHOCYTES RELATIVE PERCENT: 19.8 %
MEAN CORPUSCULAR HEMOGLOBIN CONC: 33.1 g/dL (ref 31.0–37.0)
MEAN CORPUSCULAR HEMOGLOBIN: 28.2 pg (ref 26.0–34.0)
MEAN CORPUSCULAR VOLUME: 85.2 fL (ref 80.0–100.0)
MONOCYTES RELATIVE PERCENT: 9.9 %
NEUTROPHILS ABSOLUTE COUNT: 4.4 10*9/L (ref 2.0–7.5)
PLATELET COUNT: 364 10*9/L (ref 150–440)
RED BLOOD CELL COUNT: 3.4 10*12/L — ABNORMAL LOW (ref 4.50–5.90)
RED BLOOD CELL COUNT: 3.4 10*12/L — ABNORMAL LOW (ref 4.50–5.90)
RED CELL DISTRIBUTION WIDTH: 16.5 % — ABNORMAL HIGH (ref 12.0–15.0)
WBC ADJUSTED: 6.8 10*9/L (ref 4.5–11.0)

## 2018-10-01 MED ORDER — FAMOTIDINE 10 MG TABLET
Freq: Two times a day (BID) | ORAL | 11 refills | 0.00000 days | Status: CP
Start: 2018-10-01 — End: 2018-10-17
  Filled 2018-10-01: qty 120, 30d supply, fill #0

## 2018-10-01 MED ORDER — MONTELUKAST 10 MG TABLET
ORAL_TABLET | Freq: Every evening | ORAL | 11 refills | 30.00000 days | Status: SS
Start: 2018-10-01 — End: 2019-09-26

## 2018-10-01 MED ORDER — VANCOMYCIN 125 MG CAPSULE
ORAL_CAPSULE | Freq: Four times a day (QID) | ORAL | 0 refills | 0.00000 days | Status: CP
Start: 2018-10-01 — End: 2018-10-17
  Filled 2018-10-01: qty 48, 12d supply, fill #0

## 2018-10-01 MED FILL — HEARTBURN RELIEF (FAMOTIDINE) 10 MG TABLET: 30 days supply | Qty: 120 | Fill #0 | Status: AC

## 2018-10-01 MED FILL — MONTELUKAST 10 MG TABLET: 30 days supply | Qty: 30 | Fill #0 | Status: AC

## 2018-10-01 MED FILL — VANCOMYCIN 125 MG CAPSULE: 12 days supply | Qty: 48 | Fill #0 | Status: AC

## 2018-10-01 MED FILL — MONTELUKAST 10 MG TABLET: ORAL | 30 days supply | Qty: 30 | Fill #0

## 2018-10-01 NOTE — Unmapped (Signed)
General Medicine Progress Note    Interval History/Subjective:  Patient found to be C diff positive. One BM reported 4/22 -> 4/23 o/n. No acute complaints on 4/23 AM assessment.    Assessment/Plan:    Principal Problem:    C. difficile diarrhea  Active Problems:    Ulcerative colitis (CMS-HCC)    Bipolar 1 disorder (CMS-HCC)    Asthma    ADHD (attention deficit hyperactivity disorder)    History of seizure    Normocytic anemia      Austin [name] is a multimorbid 28 y.o. 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 Difficile infection, asthma, GERD, seizure disorder, ADHD, developmental delay, bipolar disorder, baseline hearing impairment, and obesity (08/2018 BMI: 42.03) admitted 09/29/2018 with reports of three weeks of diarrhea and bloody stool secondary to C difficile infection.    C Difficile infection (09/29/18 stool specimen): Diarrhea streaked with blood reported x three weeks prior to 09/29/2018 admission in the setting of known ulcerative colitis. History of prior C difficile infection. The patient is s/p IV SOLUMEDROL 40 mg in the ED on admission. S/p IV LR at 100 cc/hr x 15 hrs. GI Path Panel canceled after positive C Diff resulted. No evidence of megacolon on 4/23 AM KUB.  - 4/23: PO vancomycin QID (4/23 - )  - 4/23: stopped IV SOLUMEDROL Q8H   [ ]  FOR 4/23 overnight: If patient with acute abdomen or hemodynamic instability, please order STAT KUB and page overnight GI fellow (aware of admission)  [ ]  f/u GI Luminal consult  (ordered 4/22 PM); per 4/21 telephone note from Dr. Raphael Gibney, the patient may need to be on Ustekinumab going forward, and may need flexible sigmoidoscopy +/- urgent colectomy for his medically refractory colitis  ??  Ulcerative Colitis: Follows with [facility], primary gastroenterologist appears to be Dr. Zetta Bills. Most recent prior visit with Dr. Raphael Gibney (telehealth) on 09/14/18, at which time his regimen consisted of XELJANZ 10 mg QD x 4 weeks, followed by 5 mg BID (prednisone tapered in preceding weeks towards goal of stopping altogether). Ost recent sigmoidoscopy 06/21/18 with severe (Mayo 3) colitis from anus to transverse colon, worse than on 08/19/17 colonoscopy. Diagnosed 2015 from colonoscopy after reported crampy abdominal pain, diarrhea, and hematochezia. UC refractory to prior regimens including (i) budesonide and mesalamine circa 2015, followed by (ii) infliximab (started 2017 with improvement, stopped in late 2018 after autoimmune hepatitis/DILI thought secondary to infliximab and earlier (03/2016 - 07/2016) period of prolonged C Difficile infection), (iii) Vedolizumab (ENTYVIO) and prednisone taper(stopped circa January 2020 after three flares, earlier 51-month gap in care, and then tofacitinib [name]) starting February 2020 with high dose prednisone taper.  - 4/23: Patient does NOT have home XELJANZ, will resume as outpatient  - Continue BACTRIM prophylaxis  [ ]  FOR D/C: Patient should have [facility] follow-up, preferably in [facility]     ??  Asthma:  - PRN albuterol  - PO claritin  - PO montelukast  ??  Normocytic anemia: 09/29/18 Hemoglobin 10.6, MCV 84.5. Looking at prior months, MCV has been in the mid-80s with Hemoglobins in the 10.5 - 12.5 range since January 2020. 09/24/18 iron panel showed low-normal ferritin (47), normal TIBC (368), low iron saturation (13%). Possibly multifactorial from GI losses in setting of weeks of loose stools, possible vitamin deficiencies from malabsoprtion. 09/29/18 Vitamin B12 - w/n/l at 835  - Daily CBC  [ ]  f/u 09/29/18 Folic  acid  ??  Mood disorder / Bipolar Disorder, etc.:  - PO paliperidone  - PO sertraline  - PO trazodone    History of seizure:  [ ]  FOR 4/24: Discuss history further with patient; does not appear to be on any scheduled anti-epileptics per preliminary review of home medications    Code Status:  Full Code    Dispo: Med W, floor ___________________________________________    Exam, Labs and Studies:    Physical Exam:  Temp:  [36.1 ??C-37.3 ??C] 36.6 ??C  Heart Rate:  [83-93] 92  Resp:  [18] 18  BP: (123-150)/(59-79) 128/69  SpO2:  [96 %-100 %] 99 %  Body mass index is 42.52 kg/m??.    Constitutional:??Obese, bearded male in no acute distress lying supine, watching television  Eyes:??Conjunctivae are normal.  HEENT:  ??????????Head:??Normocephalic and atraumatic.  ??????????Nose: No epistaxis or congestion.  ??????????Mouth/Throat:??Clear oropharynx. Moist mucous membranes. No stertor.  ??????????Neck:??full range of motion, no stridor.  Cardiovascular:??Regular rate and rhythm. No murmurs, rubs or gallops. 2+ and symmetric radial pulses.  Respiratory:??No increased work of breathing. Clear to auscultation bilaterally.  Gastrointestinal:??Soft, non-tender, distension consistent with habitus  Rectal: / Genitourinary:??Deferred  Musculoskeletal:??No ecchymosis, cyanosis or edema.  Neurologic: Normal speech and language. No gross focal neurologic deficits are appreciated.  Skin:??Skin is warm, dry and intact. No acute rash noted.  Psychiatric: Mood and affect are normal. Speech and behavior are normal.    Test Results:  Data Review:    All lab results last 24 hours:    Recent Results (from the past 24 hour(s))   Clostridium difficile Assay    Collection Time: 09/29/18  7:27 PM   Result Value Ref Range    C. Diff Result See Comments Negative   C. DIFFICILE PCR    Collection Time: 09/29/18  7:27 PM   Result Value Ref Range    C. Diff PCR Positive (A) Negative   Comprehensive Metabolic Panel    Collection Time: 09/30/18  6:10 AM   Result Value Ref Range    Sodium 140 135 - 145 mmol/L    Potassium 4.1 3.5 - 5.0 mmol/L    Chloride 109 (H) 98 - 107 mmol/L    Anion Gap 10 7 - 15 mmol/L    CO2 21.0 (L) 22.0 - 30.0 mmol/L    BUN 4 (L) 7 - 21 mg/dL    Creatinine 6.96 2.95 - 1.30 mg/dL    BUN/Creatinine Ratio 5     EGFR CKD-EPI Non-African American, Male >90 >=60 mL/min/1.42m2    EGFR CKD-EPI African American, Male >90 >=60 mL/min/1.7m2    Glucose 120 70 - 179 mg/dL    Calcium 9.5 8.5 - 28.4 mg/dL    Albumin 3.4 (L) 3.5 - 5.0 g/dL    Total Protein 6.4 (L) 6.5 - 8.3 g/dL    Total Bilirubin 0.2 0.0 - 1.2 mg/dL    AST 16 (L) 19 - 55 U/L    ALT 11 <50 U/L    Alkaline Phosphatase 51 38 - 126 U/L   Magnesium Level    Collection Time: 09/30/18  6:10 AM   Result Value Ref Range    Magnesium 1.6 1.6 - 2.2 mg/dL   Phosphorus Level    Collection Time: 09/30/18  6:10 AM   Result Value Ref Range    Phosphorus 3.2 2.9 - 4.7 mg/dL   C-reactive protein    Collection Time: 09/30/18  6:10 AM   Result Value Ref Range    CRP 19.7 (H) <  10.0 mg/L   PT-INR    Collection Time: 09/30/18  6:10 AM   Result Value Ref Range    PT 14.2 (H) 10.2 - 13.1 sec    INR 1.23    CBC w/ Differential    Collection Time: 09/30/18  6:10 AM   Result Value Ref Range    WBC 6.2 4.5 - 11.0 10*9/L    RBC 3.55 (L) 4.50 - 5.90 10*12/L    HGB 10.0 (L) 13.5 - 17.5 g/dL    HCT 16.1 (L) 09.6 - 53.0 %    MCV 85.0 80.0 - 100.0 fL    MCH 28.1 26.0 - 34.0 pg    MCHC 33.0 31.0 - 37.0 g/dL    RDW 04.5 (H) 40.9 - 15.0 %    MPV 8.4 7.0 - 10.0 fL    Platelet 371 150 - 440 10*9/L    Neutrophils % 88.3 %    Lymphocytes % 7.6 %    Monocytes % 3.1 %    Eosinophils % 0.3 %    Basophils % 0.1 %    Neutrophil Left Shift 2+ (A) Not Present    Absolute Neutrophils 5.5 2.0 - 7.5 10*9/L    Absolute Lymphocytes 0.5 (L) 1.5 - 5.0 10*9/L    Absolute Monocytes 0.2 0.2 - 0.8 10*9/L    Absolute Eosinophils 0.0 0.0 - 0.4 10*9/L    Absolute Basophils 0.0 0.0 - 0.1 10*9/L    Large Unstained Cells 1 0 - 4 %    Microcytosis Slight (A) Not Present    Anisocytosis Slight (A) Not Present   Morphology Review    Collection Time: 09/30/18  6:10 AM   Result Value Ref Range    Smear Review Comments See Comment (A) Undefined   Type and Screen    Collection Time: 09/30/18  2:10 PM   Result Value Ref Range    ABO Grouping O POS     Antibody Screen NEG        Imaging: Xr Abdomen 1 View Result Date: 09/30/2018  EXAM: XR ABDOMEN 1 VIEW DATE: 09/30/2018 8:37 AM ACCESSION: [id number] UN DICTATED: 09/30/2018 8:52 AM INTERPRETATION LOCATION: [facility] CLINICAL INDICATION: 28 year old Male with DIARRHEA  COMPARISON: CT abdomen and pelvis 06/17/2018 TECHNIQUE: Supine views of the abdomen. FINDINGS: There is normal gas distribution throughout the nondistended colon, as well as a few scattered loops of small bowel. No significant colonic stool burden. No abnormal calcifications or bowel displacement. The visualized lung bases are clear.     Nonobstructive bowel gas pattern.      EKG: N/A

## 2018-10-01 NOTE — Unmapped (Signed)
Problem: Infection  Goal: Infection Symptom Resolution  Outcome: Progressing     Problem: Adult Inpatient Plan of Care  Goal: Plan of Care Review  Outcome: Progressing  Goal: Optimal Comfort and Wellbeing  Outcome: Progressing     Problem: Adult Inpatient Plan of Care  Goal: Optimal Comfort and Wellbeing  Outcome: Progressing   Patient up ad- lib. Enteric precautions maintained for c-diff infection. Po antibiotics given per orders. No complaints of pain. Patient voided appropriately and tolerated po intake. Vital signs stable.

## 2018-10-01 NOTE — Unmapped (Signed)
Patient is alert and oriented x4, afebrile VSS. Discharge instructions provided to patient, he verbalized understanding of the instructions. No further questions at this time. PIV was de-accessed.        Problem: Infection  Goal: Infection Symptom Resolution  Outcome: Resolved

## 2018-10-01 NOTE — Unmapped (Signed)
VSS and afebrile. Enteric precautions maintained. Pt reported no BM overnight. Denied any other needs or concerns. Will CTM.     Problem: Infection  Goal: Infection Symptom Resolution  Outcome: Progressing     Problem: Adult Inpatient Plan of Care  Goal: Plan of Care Review  Outcome: Progressing  Goal: Patient-Specific Goal (Individualization)  Outcome: Progressing  Goal: Absence of Hospital-Acquired Illness or Injury  Outcome: Progressing  Goal: Optimal Comfort and Wellbeing  Outcome: Progressing  Goal: Readiness for Transition of Care  Outcome: Progressing  Goal: Rounds/Family Conference  Outcome: Progressing

## 2018-10-01 NOTE — Unmapped (Signed)
Physician Discharge Summary    Identifying Information:   Austin [name]  1990-09-17  [id number]    Admit date: 09/29/2018    Discharge date: 10/01/2018     Discharge Service: [facility] (MDW)    Discharge Attending Physician: Deeann Cree, MD    Discharge to: Home    Discharge Diagnoses:  Principal Problem:    C. difficile diarrhea  Active Problems:    Ulcerative colitis (CMS-HCC)    Bipolar 1 disorder (CMS-HCC)    Asthma    ADHD (attention deficit hyperactivity disorder)    History of seizure    Normocytic anemia  Resolved Problems:    * No resolved hospital problems. *      Hospital Course:     Austin [name] is a multimorbid 28 y.o. 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 Difficile infection, asthma, GERD, history of seizure disorder, ADHD, developmental delay, bipolar disorder, baseline hearing impairment, and obesity (08/2018 BMI: 42.03) admitted 09/29/2018 with reports of three weeks of diarrhea and blood-streaked stool secondary to C. Difficile diarrhea.  ??  C Difficile infection (09/29/18 stool specimen):??Diarrhea streaked with blood reported x three weeks prior to 09/29/2018??admission in the setting of known ulcerative colitis. History of prior C difficile infection.??The patient is s/p IV SOLUMEDROL 40 mg in the ED on admission.??S/p IV LR at 100 cc/hr x 15 hrs. GI Path Panel canceled after positive C Diff resulted. No evidence of megacolon on 4/23 AM KUB. He was discharged on PO vancomycin QID which he should complete as an outpatient for a cumulative 14-day course (started 09/30/18) per recs from 09/30/18 GI consult note. Spoke with GI and with the patient's mother on date of discharge to coordinate plan; GI will schedule follow-up in Dr. Jonny Ruiz clinic.  ??  Ulcerative Colitis:??Follows with [facility], primary gastroenterologist appears to be Dr. Zetta Bills. Most recent prior visit with Dr. Raphael Gibney (telehealth) on 09/14/18, at which time his regimen consisted of XELJANZ 10 mg QD x 4 weeks, followed by 5 mg BID (prednisone tapered in preceding weeks towards goal of stopping altogether). Ost recent sigmoidoscopy 06/21/18 with severe (Mayo 3) colitis from anus to transverse colon, worse than on 08/19/17 colonoscopy. Diagnosed 2015 from colonoscopy after reported crampy abdominal pain, diarrhea, and hematochezia. UC refractory to prior regimens including (i) budesonide and mesalamine circa 2015, followed by (ii) infliximab (started 2017 with improvement, stopped in late 2018 after autoimmune hepatitis/DILI thought secondary to infliximab and earlier (03/2016 - 07/2016) period of prolonged C Difficile infection), (iii) Vedolizumab (ENTYVIO) and prednisone taper(stopped circa January 2020 after three flares, earlier 45-month gap in care, and then tofacitinib [name]) starting February 2020 with high dose prednisone taper.Patient did NOT have home XELJANZ while in the hospital, will resume as outpatient. He will continue BACTRIM prophylaxis. Patient, mother instructed to NOT resume steroids at 10/01/18 discharge pending guidance from Dr. Jonny Ruiz team.  ??  Asthma: Continued on home regimen including PRN albuterol, PO claritin, and PO montelukast (refill sent for montelukast at discharge per request from mother).  ??  Normocytic anemia: 09/29/18 Hemoglobin 10.6, MCV 84.5. Looking at prior months, MCV has been in the mid-80s with Hemoglobins in the 10.5 - 12.5 range since January 2020. 09/24/18 iron panel showed low-normal ferritin (47), normal TIBC (368), low iron saturation (13%).??Possibly multifactorial from GI losses in setting of weeks of loose stools, possible vitamin deficiencies from malabsoprtion. 09/29/18 Vitamin B12 - w/n/l at  835. Well-appearing and in no acute distress on 10/01/18 assessment, recommend follow-up as outpatient PRN.  ??  Mood disorder??/ Bipolar Disorder, etc.: Continued on outpatient regimen including PO paliperidone, PO sertraline, PO trazodone.  ??  History of seizure:  Per 10/01/18 AM conversation with the patient's mother, no history of seizure reported since circa 2017. No on any scheduled anti-epileptics.          Post Discharge Follow Up Issues:   - Follow-up with Dr. Raphael Gibney [facility]) for C. Difficile diarrhea, Ulcerative colitis    Procedures:    No admission procedures for hospital encounter.  _____________________________________________________________________________  Discharge Day Services:  BP 159/77  - Pulse 109  - Temp 37.3 ??C (Oral)  - Resp 18  - Wt (!) 115.2 kg (253 lb 15.5 oz)  - SpO2 97%  - BMI 42.26 kg/m??   Pt seen on the day of discharge and determined appropriate for discharge.    Condition at Discharge: stable    Length of Discharge: I spent less than 30 mins in the discharge of this patient.  _____________________________________________________________________________  Discharge Medications:     Your Medication List      STOP taking these medications    predniSONE 10 MG tablet  Commonly known as:  DELTASONE        START taking these medications    vancomycin 125 MG capsule  Commonly known as:  VANCOCIN  Take 1 capsule (125 mg total) by mouth Four (4) times a day for 12 days.        CHANGE how you take these medications    famotidine 10 MG tablet  Commonly known as:  PEPCID  Take 2 tablets (20 mg total) by mouth Two (2) times a day.  What changed:  medication strength        CONTINUE taking these medications    albuterol 90 mcg/actuation inhaler  Commonly known as:  PROVENTIL HFA;VENTOLIN HFA  Inhale 2 puffs every four (4) hours as needed.     albuterol 2.5 mg /3 mL (0.083 %) nebulizer solution  Inhale 2.5 mg every four (4) hours as needed.     cholecalciferol 1,000 unit (25 mcg) tablet  Generic drug:  cholecalciferol (vitamin D3)  Take 1,000 Units by mouth daily.     EPINEPHrine 0.3 mg/0.3 mL injection  Commonly known as:  EPIPEN  Inject 0.3 mg into the muscle.     loratadine 10 mg tablet  Commonly known as:  CLARITIN  Take 10 mg by mouth daily.     montelukast 10 mg tablet  Commonly known as:  SINGULAIR  Take 1 tablet (10 mg total) by mouth nightly.     ondansetron 4 MG tablet  Commonly known as:  ZOFRAN  Take 4 mg by mouth every eight (8) hours as needed for nausea.     paliperidone 3 MG 24 hr tablet  Commonly known as:  INVEGA  Take 3 mg by mouth daily at 10am.     propranoloL 10 MG tablet  Commonly known as:  INDERAL  TK 1 T PO TID     sertraline 100 MG tablet  Commonly known as:  ZOLOFT  Take 75 mg by mouth daily.     sulfamethoxazole-trimethoprim 400-80 mg per tablet  Commonly known as:  BACTRIM  Take 1 tablet (80 mg of trimethoprim total) by mouth daily.     traZODone 50 MG tablet  Commonly known as:  DESYREL  Take 50 mg by mouth nightly.  XELJANZ 10 mg Tab  Generic drug:  tofacitinib  Take 1 tablet (10 mg) by mouth Two (2) times a day.          _____________________________________________________________________________  Pending Test Results (if blank, then none):      Most Recent Labs:  Microbiology Results (last day)     ** No results found for the last 24 hours. **          Lab Results   Component Value Date    WBC 6.8 10/01/2018    HGB 9.6 (L) 10/01/2018    HCT 28.9 (L) 10/01/2018    PLT 364 10/01/2018       Lab Results   Component Value Date    NA 143 10/01/2018    K 3.5 10/01/2018    CL 110 (H) 10/01/2018    CO2 20.0 (L) 10/01/2018    BUN 6 (L) 10/01/2018    CREATININE 0.87 10/01/2018    CALCIUM 8.9 10/01/2018    MG 1.6 10/01/2018    PHOS 4.2 10/01/2018       Lab Results   Component Value Date    ALKPHOS 73 10/01/2018    BILITOT <0.1 10/01/2018    PROT 5.9 (L) 10/01/2018    ALBUMIN 3.0 (L) 10/01/2018    ALT 8 10/01/2018    AST 13 (L) 10/01/2018    GGT 496 (H) 01/26/2017       Lab Results   Component Value Date    PT 12.8 10/01/2018    INR 1.11 10/01/2018     Hospital Radiology:  Xr Abdomen 1 View    Result Date: 09/30/2018  EXAM: XR ABDOMEN 1 VIEW DATE: 09/30/2018 8:37 AM ACCESSION: [id number] UN DICTATED: 09/30/2018 8:52 AM INTERPRETATION LOCATION: [facility] CLINICAL INDICATION: 28 year old Male with DIARRHEA  COMPARISON: CT abdomen and pelvis 06/17/2018 TECHNIQUE: Supine views of the abdomen. FINDINGS: There is normal gas distribution throughout the nondistended colon, as well as a few scattered loops of small bowel. No significant colonic stool burden. No abnormal calcifications or bowel displacement. The visualized lung bases are clear.     Nonobstructive bowel gas pattern.      _____________________________________________________________________________  Discharge Instructions:       Diet Instructions     Discharge diet (specify)      Discharge Nutrition Therapy:  General          Other Instructions     Call MD for:  difficulty breathing, headache or visual disturbances      Call MD for:  extreme fatigue      Call MD for:  hives      Call MD for:  persistent dizziness or light-headedness      Call MD for:  persistent nausea or vomiting      Call MD for:  redness, tenderness, or signs of infection (pain, swelling, redness, odor or green/yellow discharge around incision site)      Call MD for:  severe uncontrolled pain      Call MD for: Temperature > 38.5 Celsius ( > 101.3 Fahrenheit)      Discharge instructions      You were admitted 09/29/2018 to 10/01/18 for C difficile diarrhea. We are discharging you on oral vancomycin four times a day for an additional twelve days (from 10/02/18) to complete a total 14-day course. We spoke with the Gastroenterology team and they will put in a referral for a follow-up appointment with Dr. Jonny Ruiz team regarding your ulcerative colitis management. Please do NOT  restart steroids as an outpatient. We refilled your SINGULAIR and PEPCID; please make sure to pick those up (along with the vancomycin) from the pharmacy downstairs before you leave the [facility]. Thank you for allowing [place] to participate in your care.               Follow Up instructions and Outpatient Referrals     Call MD for:  difficulty breathing, headache or visual disturbances      Call MD for:  extreme fatigue      Call MD for:  hives      Call MD for:  persistent dizziness or light-headedness      Call MD for:  persistent nausea or vomiting      Call MD for:  redness, tenderness, or signs of infection (pain, swelling, redness, odor or green/yellow discharge around incision site)      Call MD for:  severe uncontrolled pain      Call MD for: Temperature > 38.5 Celsius ( > 101.3 Fahrenheit)      Discharge instructions

## 2018-10-02 NOTE — Unmapped (Signed)
Care Management  Initial Transition Planning Assessment      CM initial assessment completed telephonically as a precautionary measure in accordance with [facility] COVID-19 Pandemic emergency response plan. Patient and his mother (his guardian) verbalized understanding and agreement with completing this assessment by telephone.              General  Care Manager assessed the patient by : In person interview with patient, Telephone conversation with family, Medical record review, Discussion with Clinical Care team  Orientation Level: Oriented X4  Who provides care at home?: Family member(mother)  Reason for referral: Discharge Planning     Per Medical Record??  Austin [name]??is a??multimorbid??28 y.o.??male??with a medical history that includes ulcerative colitis??(UC)??on tofacitinib [name]),??prior??autoimmune hepatitis??(thought 2/2 infliximab), prior C. Difficile infection,??asthma,??GERD, seizure disorder,??ADHD,??developmental delay,??bipolar disorder,??baseline hearing impairment, and obesity (08/2018 BMI: 42.03)??admitted 09/29/2018??with reports of three weeks of diarrhea and bloody stool suggestive of UC flare.    Contact/Decision Maker  Extended Emergency Contact Information  Primary Emergency Contact: Wilson,Temeshia  Mobile Phone: [phone]  Relation: Mother  Interpreter needed? No  Secondary Emergency Contact: Wilson,Carolyn  Mobile Phone: [phone]  Relation: Grandparent  Interpreter needed? No    Legal Next of Kin / Guardian / POA / Advance Directives   Patient's guardian/HC POA is his mother, Cala Bradford.  Ph:  [phone]    Advance Directive (Medical Treatment)  Does patient have an advance directive covering medical treatment?: Patient has advance directive covering medical treatment, copy not in chart.  Advance directive covering medical treatment not in Chart:: Copy requested from family  Information provided on advance directive:: No  Patient requests assistance:: No    Health Care Decision Maker [HCDM] (Medical & Mental Health Treatment)  Information provided on advance directive:: No  Patient requests assistance:: No    Patient Information  Medical Provider(s): [facility]  Reason for Admission: Admitting Diagnosis:  No admission diagnoses are documented for this encounter.  Past Medical History:   has a past medical history of ADHD, Asthma, Bipolar 1 disorder (CMS-HCC), Depression, GERD (gastroesophageal reflux disease), HOH (hard of hearing), Hypertension, Mental developmental delay, Seizures (CMS-HCC), and Ulcerative colitis (CMS-HCC).  Past Surgical History:   has a past surgical history that includes Adenoidectomy; pr colonoscopy w/biopsy single/multiple (N/A, 02/12/2017); pr colonoscopy flx dx w/collj spec when pfrmd (N/A, 08/19/2017); and pr sigmoidoscopy flx dx w/collj spec br/wa if pfrmd (N/A, 06/21/2018).   Previous admit date: 06/17/2018    Primary Insurance- Payor: MEDICAID [place] / Plan: MEDICAID [place] ACCESS / Product Type: *No Product type* /   Secondary Insurance ??? None  Prescription Coverage ??? Medicaid  Preferred Pharmacy - [facility] [phone] - [place],  - [address] AT [facility] [address]  [facility]  [facility]    Transportation home: Private vehicle  Level of function prior to admission: Independent    Lives with: Parent, Family members    Type of Residence: Private residence    Support Systems: Family Members, Parent, Friends/Neighbors    Responsibilities/Dependents at home?: No    Home Care services in place prior to admission?: No    Outpatient/Community Resources in place prior to admission: Clinic  Agency detail (Name/Phone #): PCP is through [facility]    Equipment Currently Used at Home: none     Currently receiving outpatient dialysis?: No     Financial Information  Patient is on disability  Need for financial  assistance?: No     Social Determinants of Health  Social Determinants of Health were addressed in provider documentation.  Please refer to patient history.    Discharge Needs Assessment  Concerns to be Addressed: no discharge needs identified, denies needs/concerns at this time    Clinical Risk Factors: New Diagnosis, Multiple Diagnoses (Chronic)    Barriers to taking medications: No    Prior overnight hospital stay or ED visit in last 90 days: No    Readmission Within the Last 30 Days: no previous admission in last 30 days    Anticipated Changes Related to Illness: none    Equipment Needed After Discharge: none    Discharge Facility/Level of Care Needs: other (see comments)(home with self care/assistance from his mother)    Readmission  Risk of Unplanned Readmission Score: UNPLANNED READMISSION SCORE: 17%  Predictive Model Details           17% (Medium) Factors Contributing to Score   Calculated 10/01/2018 12:00 23% Number of active Rx orders is 29   [place] Risk of Unplanned Readmission Model 19% Number of ED visits in last six months is 3   *Archived Data 11% Active antipsychotic Rx order is present     8% Imaging order is present in last 6 months     7% Latest hemoglobin is low (9.6 g/dL)     7% Phosphorous result is present     6% Number of hospitalizations in last year is 1     5% Charlson Comorbidity Index is 4     5% Diagnosis of deficiency anemia is present     5% Active corticosteroid Rx order is present     2% Age is 28     2% Current length of stay is 1.839 days     1% Active ulcer medication Rx order is present     Readmitted Within the Last 30 Days? (No if blank)   Patient at risk for readmission?: Yes    Discharge Plan  Screen findings are: Discharge planning needs identified or anticipated (Comment).    Expected Discharge Date: 10/01/18    Patient and/or family were provided with choice of facilities / services that are available and appropriate to meet post hospital care needs?: N/A    Initial Assessment complete?: Yes     CM will continue to follow for care progression and avoidable delays.     Norva Pavlov, RN, BSN  Care Manager [phone])

## 2018-10-02 NOTE — Unmapped (Signed)
Briefly spoke with mom for post-discharge follow up. Explained plan to finish 2 weeks of vancomycin and see how he does. We will arrange for a video follow up visit with Caryn Bee (non-epic video encounter).

## 2018-10-03 ENCOUNTER — Encounter
Admit: 2018-10-03 | Discharge: 2018-10-17 | Disposition: A | Discharge status: Home / Self Care | Payer: MEDICAID | Attending: Registered Nurse | Admitting: Surgery

## 2018-10-03 ENCOUNTER — Encounter
Admit: 2018-10-03 | Discharge: 2018-10-17 | Disposition: A | Discharge status: Home / Self Care | Payer: MEDICAID | Attending: Certified Registered" | Admitting: Surgery

## 2018-10-03 ENCOUNTER — Ambulatory Visit
Admit: 2018-10-03 | Discharge: 2018-10-17 | Disposition: A | Discharge status: Home / Self Care | Payer: MEDICAID | Admitting: Surgery

## 2018-10-03 DIAGNOSIS — R112 Nausea with vomiting, unspecified: Principal | ICD-10-CM

## 2018-10-03 LAB — COMPREHENSIVE METABOLIC PANEL
ALBUMIN: 3.4 g/dL — ABNORMAL LOW (ref 3.5–5.0)
ALKALINE PHOSPHATASE: 52 U/L (ref 38–126)
ANION GAP: 8 mmol/L (ref 7–15)
AST (SGOT): 15 U/L — ABNORMAL LOW (ref 19–55)
BILIRUBIN TOTAL: 0.3 mg/dL (ref 0.0–1.2)
BLOOD UREA NITROGEN: 4 mg/dL — ABNORMAL LOW (ref 7–21)
BUN / CREAT RATIO: 5
CALCIUM: 9.2 mg/dL (ref 8.5–10.2)
CHLORIDE: 107 mmol/L (ref 98–107)
CO2: 26 mmol/L (ref 22.0–30.0)
CO2: 26 mmol/L — ABNORMAL LOW (ref 22.0–30.0)
CREATININE: 0.83 mg/dL (ref 0.70–1.30)
EGFR CKD-EPI AA MALE: >90 mL/min/{1.73_m2} (ref >=60)
GLUCOSE RANDOM: 96 mg/dL (ref 70–179)
POTASSIUM: 3.4 mmol/L — ABNORMAL LOW (ref 3.5–5.0)
PROTEIN TOTAL: 6.3 g/dL — ABNORMAL LOW (ref 6.5–8.3)
SODIUM: 141 mmol/L (ref 135–145)

## 2018-10-03 LAB — CBC W/ AUTO DIFF
BASOPHILS ABSOLUTE COUNT: 0 10*9/L (ref 0.0–0.1)
BASOPHILS RELATIVE PERCENT: 0.4 %
EOSINOPHILS ABSOLUTE COUNT: 0.3 10*9/L (ref 0.0–0.4)
EOSINOPHILS RELATIVE PERCENT: 4 %
HEMATOCRIT: 30.4 % — ABNORMAL LOW (ref 41.0–53.0)
HEMOGLOBIN: 10.3 g/dL — ABNORMAL LOW (ref 13.5–17.5)
LARGE UNSTAINED CELLS: 3 % (ref 0–4)
LYMPHOCYTES RELATIVE PERCENT: 20.4 %
MEAN CORPUSCULAR HEMOGLOBIN CONC: 33.9 g/dL (ref 31.0–37.0)
MEAN CORPUSCULAR HEMOGLOBIN: 28.5 pg (ref 26.0–34.0)
MEAN CORPUSCULAR VOLUME: 83.9 fL (ref 80.0–100.0)
MEAN PLATELET VOLUME: 8.1 fL (ref 7.0–10.0)
MONOCYTES ABSOLUTE COUNT: 0.6 10*9/L (ref 0.2–0.8)
MONOCYTES RELATIVE PERCENT: 9.5 %
NEUTROPHILS ABSOLUTE COUNT: 4 10*9/L (ref 2.0–7.5)
NEUTROPHILS RELATIVE PERCENT: 63 %
PLATELET COUNT: 368 10*9/L (ref 150–440)
RED BLOOD CELL COUNT: 3.62 10*12/L — ABNORMAL LOW (ref 4.50–5.90)
RED CELL DISTRIBUTION WIDTH: 15.6 % — ABNORMAL HIGH (ref 12.0–15.0)
WBC ADJUSTED: 6.4 10*9/L (ref 4.5–11.0)

## 2018-10-03 NOTE — Unmapped (Signed)
Pt c/o abdominal pain (hx of UC and GERD), no fevers, states it feels like a flare.

## 2018-10-03 NOTE — Unmapped (Signed)
[facility] Emergency Department Provider Note    Patient Identification  Austin [name]  Patient information was obtained from patient and past medical records.  History/Exam limitations: none.    ED Clinical Impression     Final diagnoses:   Generalized abdominal pain (Primary)   Non-intractable vomiting with nausea, unspecified vomiting type   Diarrhea, unspecified type     Initial Impression, ED Course, Assessment and Plan     Impression: This is a 28 year old male with a history of ulcerative colitis, GERD, asthma, bipolar disorder, developmental delay and recent discharge 2 days ago after a 2-day admission for C. difficile colitis, who presents with persistent bloody diarrhea as well as one episode of nonbloody nonbilious emesis this morning.      Initial vital signs reassuring and afebrile.  Patient chronically ill, but nontoxic-appearing in no significant distress.  Normal cardiopulmonary exam and soft abdomen, with slight tenderness to palpation diffusely, most predominantly on the right side.  No lower extremity edema.  Mucous membranes are moist.    Suspect likely continued symptoms secondary to C. difficile colitis.  Patient does not appear severely dehydrated.  Abdomen is reassuring.  Do not feel that imaging of the abdomen is indicated at this time.  We will plan for CBC, CMP, lipase, urinalysis and CRP.  We will give IV Zofran.    Labs with a normal WBC count at 6.4. Hgb 10.3, electrolytes and renal function WNLs other than mild hypokalemia at 3.4. CRP is elevated at 36 (was 17.6 on DC 2 days ago and was only 19 on admission 4 days ago). Given uptrending CPR and now emesis, will plan for CT A/P to evaluate for toxic megacolon or other intra-abdominal pathology.     6:07 PM  CT scan with worsened inflammatory changes. Given this, worsened symptoms and vomiting, despite use of PO vanc as prescribed will plan to page MAO for admission.     Disposition: admit    Additional Medical Decision Making I reviewed the patient's prior medical records.   I discussed the case with the MAO/admitting provider.    Any labs and radiology results that were available during my care of the patient were independently reviewed by me and considered in my medical decision making.    Portions of this record have been created using Scientist, clinical (histocompatibility and immunogenetics). Dictation errors have been sought, but may not have been identified and corrected.  ____________________________________________    I have reviewed the triage vital signs and the nursing notes. I have discussed case with ED attending, Dr. Matilde Haymaker.     Patient History     Chief Complaint  Light Headed and Abdominal Pain      HPI   Austin [name] is a 28 y.o. male with a history of ulcerative colitis, GERD, asthma, bipolar disorder, developmental delay and recent discharge 2 days ago after a 2-day admission for C. difficile colitis, who presents with persistent bloody diarrhea as well as one episode of nonbloody nonbilious emesis this morning.  Patient reports generalized fatigue and intermittent lightheadedness.  States that his generalized fatigue, lightheadedness and bloody diarrhea have all been stable since discharge.  Abdominal pain is the same since discharge.  He does not feel that he has been improving.  He did have one episode of emesis this morning of which is new.  No fevers.  No chest pain, cough or shortness of breath.  No dysuria, hematuria.  Patient has been taking and tolerating his home p.o. vancomycin.  He  has been taking his other medications.  He states that this feels more like the C. difficile infection than UC flare.  No other modifying factors.  He has been able to tolerate p.o. intake, but has remained nauseated.     Past Medical History:   Diagnosis Date   ??? ADHD    ??? Asthma    ??? Bipolar 1 disorder (CMS-HCC)    ??? Depression    ??? GERD (gastroesophageal reflux disease)    ??? HOH (hard of hearing)    ??? Hypertension    ??? Mental developmental delay    ??? Seizures (CMS-HCC)     last seizures 2016   ??? Ulcerative colitis (CMS-HCC)        Patient Active Problem List   Diagnosis   ??? Ulcerative colitis (CMS-HCC)   ??? Bipolar 1 disorder (CMS-HCC)   ??? Asthma   ??? Elevated LFTs   ??? Autoimmune hepatitis (CMS-HCC)   ??? ADHD (attention deficit hyperactivity disorder)   ??? History of seizure   ??? Iron deficiency anemia   ??? C. difficile diarrhea   ??? Normocytic anemia       Past Surgical History:   Procedure Laterality Date   ??? ADENOIDECTOMY     ??? PR COLONOSCOPY FLX DX W/COLLJ SPEC WHEN PFRMD N/A 08/19/2017    Procedure: COLONOSCOPY, FLEXIBLE, PROXIMAL TO SPLENIC FLEXURE; DIAGNOSTIC, W/WO COLLECTION SPECIMEN BY BRUSH OR WASH;  Surgeon: Bluford Kaufmann, MD;  Location: [facility];  Service: Gastroenterology   ??? PR COLONOSCOPY W/BIOPSY SINGLE/MULTIPLE N/A 02/12/2017    Procedure: COLONOSCOPY, FLEXIBLE, PROXIMAL TO SPLENIC FLEXURE; WITH BIOPSY, SINGLE OR MULTIPLE;  Surgeon: Rona Ravens, MD;  Location: [facility];  Service: Gastroenterology   ??? PR SIGMOIDOSCOPY FLX DX W/COLLJ SPEC BR/WA IF PFRMD N/A 06/21/2018    Procedure: SIGMOIDOSCOPY, FLEXIBLE; DIAGNOSTIC, WITH OR WITHOUT COLLECTION OF SPECIMEN(S) BY BRUSHING OR WASHING;  Surgeon: Cletis Athens, MD;  Location: [facility];  Service: Gastroenterology       No current facility-administered medications for this encounter.     Current Outpatient Medications:   ???  albuterol (PROVENTIL HFA;VENTOLIN HFA) 90 mcg/actuation inhaler, Inhale 2 puffs every four (4) hours as needed. , Disp: , Rfl:   ???  albuterol 2.5 mg /3 mL (0.083 %) nebulizer solution, Inhale 2.5 mg every four (4) hours as needed. , Disp: , Rfl:   ???  cholecalciferol, vitamin D3, (CHOLECALCIFEROL) 1,000 unit tablet, Take 1,000 Units by mouth daily. , Disp: , Rfl:   ???  EPINEPHrine (EPIPEN) 0.3 mg/0.3 mL injection, Inject 0.3 mg into the muscle., Disp: , Rfl:   ???  famotidine (PEPCID) 10 MG tablet, Take 2 tablets (20 mg total) by mouth Two (2) times a day., Disp: 120 each, Rfl: 11  ???  loratadine (CLARITIN) 10 mg tablet, Take 10 mg by mouth daily. , Disp: , Rfl:   ???  montelukast (SINGULAIR) 10 mg tablet, Take 1 tablet (10 mg total) by mouth nightly., Disp: 30 tablet, Rfl: 11  ???  ondansetron (ZOFRAN) 4 MG tablet, Take 4 mg by mouth every eight (8) hours as needed for nausea., Disp: , Rfl:   ???  paliperidone (INVEGA) 3 MG 24 hr tablet, Take 3 mg by mouth daily at 10am., Disp: , Rfl:   ???  propranolol (INDERAL) 10 MG tablet, TK 1 T PO TID, Disp: , Rfl: 2  ???  sertraline (ZOLOFT) 100 MG tablet, Take 75 mg by mouth daily. , Disp: ,  Rfl:   ???  sulfamethoxazole-trimethoprim (BACTRIM) 400-80 mg per tablet, Take 1 tablet (80 mg of trimethoprim total) by mouth daily., Disp: 36 tablet, Rfl: 0  ???  tofacitinib 10 mg Tab, Take 1 tablet (10 mg) by mouth Two (2) times a day., Disp: 60 tablet, Rfl: 3  ???  traZODone (DESYREL) 50 MG tablet, Take 50 mg by mouth nightly., Disp: , Rfl:   ???  vancomycin (VANCOCIN) 125 MG capsule, Take 1 capsule (125 mg total) by mouth Four (4) times a day for 12 days., Disp: 48 capsule, Rfl: 0    Allergies  Infliximab; Nsaids (non-steroidal anti-inflammatory drug); Pineapple; Strawberry; Tolmetin; Tomato; Amoxicillin-pot clavulanate; and Omeprazole    Family History   Problem Relation Age of Onset   ??? Cancer Paternal Uncle         unknown, but has an ostomy   ??? Crohn's disease Paternal Aunt    ??? Cancer Maternal Uncle         colon   ??? Ulcerative colitis Neg Hx        Social History  Social History     Tobacco Use   ??? Smoking status: Never Smoker   ??? Smokeless tobacco: Never Used   Substance Use Topics   ??? Alcohol use: No   ??? Drug use: No       Review of Systems  General: no fevers, chills  Cardiac: no CP, SOB, palpitations  Pulmonary: no cough, sputum production, hemoptysis  A 10 point review of systems was negative except for those previously mentioned in the HPI and above.    Physical Exam     ED Triage Vitals [10/03/18 1318]   Enc Vitals Group      BP 115/96      Heart Rate 97      SpO2 Pulse       Resp 16      Temp 36.7 ??C (98 ??F)      Temp Source Oral      SpO2 100 %     Constitutional: Chronically ill but nontoxic-appearing, alert and cooperative in NAD  ENT:       Head: [place]/AT       Eyes: PERRL, conjunctiva clear, sclera anicteric       Mouth/Throat: MM moist       Neck: supple, midline trachea, no tenderness or cervical adenopathy  Cardiac: RRR, normal S1, S2, no appreciable murmurs or rubs. Distal pulses present and symmetrical B/L  Respiratory: CTA bilaterally, non-labored breathing, no stridor, wheezing or crackles  Abdomen: soft, diffusely mildly tender to palpation, most predominant over the right side, normal BS. No masses, rebound or guarding.  Negative Murphy's.  Extremities: LE normal with no CCE  Skin: warm and dry, no rashes or lesions  Psychiatric: normal mood, affect, speech and behavior    Radiology     CT Abdomen Pelvis with IV Contrast ONLY   Final Result   - Loops of nondistended, fluid-filled, colon with mild circumferential wall thickening extending from level of the transverse colon to the rectum. These findings are nonspecific, however, could be seen in the setting of infectious or inflammatory colitis.   - Mild fatty infiltration of the sigmoid colon and rectal wall similar to prior compatible with chronic inflammatory changes from patient's reported history of ulcerative colitis.      ====================   ADDENDUM (10/03/2018 5:48 PM): Dr.Altun dictating.   I agree with the report. There is increased wall thickening and enhancement of the rectosigmoid  colon compared to the prior suggestive of inflammation. There is also likely mild inflammation of the descending colon which is overall stable to slightly improved compared to prior. These findings could be seen secondary to active inflammation of ulcerative colitis although superimposed infectious colitis cannot be excluded.      Fluid-filled colon could also be associated with diarrheal state.             Renaldo Reel, MD  Resident  10/03/18 [phone]

## 2018-10-03 NOTE — Unmapped (Signed)
Mr. Bellis's mother contacted the on call GI pager this morning. Mr. Iran Sizer has UC currently treated with xeljanz (tofacitinib).   He was discharged on 10/01/18 after a hospitalization for diarrhea secondary to C diff. Since discharge his diarrhea has gotten worse and he has developed frequent N/V. He feels lightheaded, dizzy, and weak.     I recommended that his mother bring him to our ED for further evaluation. He may need imaging of his abdomen to rule out toxic megacolon secondary to C diff.

## 2018-10-03 NOTE — Unmapped (Signed)
.  Patient rounding complete, call bell in reach, bed locked and in lowest position, patient belongings at bedside and within reach of patient.  Patient updated on plan of care.      Patient brought by EMS. ED MD and Nurse Delice Bison RN at the bedside with patient.

## 2018-10-03 NOTE — Unmapped (Signed)
.  Patient rounding complete, call bell in reach, bed locked and in lowest position, patient belongings at bedside and within reach of patient.  Patient updated on plan of care.      Asked pt to collect UA. Patient said unable to void at this time.   Patient said he has pain 10/10 for lower abd pain.

## 2018-10-04 LAB — CBC
HEMATOCRIT: 28.3 % — ABNORMAL LOW (ref 41.0–53.0)
MEAN CORPUSCULAR HEMOGLOBIN CONC: 33.2 g/dL (ref 31.0–37.0)
MEAN CORPUSCULAR HEMOGLOBIN: 27.8 pg (ref 26.0–34.0)
MEAN CORPUSCULAR VOLUME: 83.9 fL (ref 80.0–100.0)
MEAN PLATELET VOLUME: 8 fL (ref 7.0–10.0)
PLATELET COUNT: 342 10*9/L (ref 150–440)
PLATELET COUNT: 342 10*9/L — ABNORMAL HIGH (ref 150–440)
RED BLOOD CELL COUNT: 3.38 10*12/L — ABNORMAL LOW (ref 4.50–5.90)
RED CELL DISTRIBUTION WIDTH: 15.6 % — ABNORMAL HIGH (ref 12.0–15.0)
WBC ADJUSTED: 7 10*9/L (ref 4.5–11.0)

## 2018-10-04 LAB — COMPREHENSIVE METABOLIC PANEL
ALBUMIN: 3 g/dL — ABNORMAL LOW (ref 3.5–5.0)
ALKALINE PHOSPHATASE: 49 U/L (ref 38–126)
ALT (SGPT): 6 U/L (ref <50)
ALT (SGPT): 6 U/L — ABNORMAL LOW (ref 22.0–<50)
ANION GAP: 14 mmol/L (ref 7–15)
AST (SGOT): 12 U/L — ABNORMAL LOW (ref 19–55)
BILIRUBIN TOTAL: 0.3 mg/dL (ref 0.0–1.2)
BLOOD UREA NITROGEN: 5 mg/dL — ABNORMAL LOW (ref 7–21)
CALCIUM: 8.6 mg/dL (ref 8.5–10.2)
CHLORIDE: 104 mmol/L (ref 98–107)
CO2: 22 mmol/L (ref 22.0–30.0)
CREATININE: 0.82 mg/dL (ref 0.70–1.30)
EGFR CKD-EPI AA MALE: >90 mL/min/{1.73_m2} (ref >=60)
EGFR CKD-EPI NON-AA MALE: >90 mL/min/{1.73_m2} (ref >=60)
GLUCOSE RANDOM: 79 mg/dL (ref 70–179)
POTASSIUM: 3.3 mmol/L — ABNORMAL LOW (ref 3.5–5.0)
PROTEIN TOTAL: 5.8 g/dL — ABNORMAL LOW (ref 6.5–8.3)
SODIUM: 140 mmol/L (ref 135–145)

## 2018-10-04 NOTE — Unmapped (Signed)
Care Management  Initial Transition Planning Assessment                 General Assessment:   Care Manager assessed the patient by : Medical record review(CM gathered information from patient's H&P and most recent encounter. Patient recently discharged two days ago after being admitted with similar sx.)  Orientation Level: Oriented X4  Reason for referral: (Assess focus is to determine if patient has home needs.)    Contact/Decision Maker  Extended Emergency Contact Information  Primary Emergency Contact: Austin [name],Austin [name]  Mobile Phone: [phone]  Relation: Mother  Interpreter needed? No  Secondary Emergency Contact: Austin [name],Carolyn  Mobile Phone: [phone]  Relation: Grandparent  Interpreter needed? No    Legal Next of Kin / Guardian / POA / Advance Directives     Advance Directive (Medical Treatment)  Does patient have an advance directive covering medical treatment?: Patient has advance directive covering medical treatment, copy not in chart.  Advance directive covering medical treatment not in Chart:: Copy requested from family  Information provided on advance directive:: No  Patient requests assistance:: No    Health Care Decision Maker [HCDM] (Medical & Mental Health Treatment)  Information provided on advance directive:: No  Patient requests assistance:: No    Patient Information  Lives with: Parent(Patient resides in the home with his mother)    Type of Residence: Private residence  Location/Detail: [address] [place], [place] [address]: Phone: [phone]    Support Systems: Family Members, Parent    Responsibilities/Dependents at home?: No    Home Care services in place prior to admission?: No(according to CAT/transition note from last week. Patient not receiving HH. )    Equipment Currently Used at Home: none    Currently receiving outpatient dialysis?: No     Type of Residence: Mailing Address:  [address]  [place] [address]  Contacts: Accompanied by: Family member  Patient Phone Number: [phone]        Medical Provider(s): FAMILY M CASWELL  Reason for Admission: Admitting Diagnosis:  Generalized abdominal pain [R10.84]  Diarrhea, unspecified type [R19.7]  Non-intractable vomiting with nausea, unspecified vomiting type [R11.2]  Past Medical History:   has a past medical history of ADHD, Asthma, Bipolar 1 disorder (CMS-HCC), Depression, GERD (gastroesophageal reflux disease), HOH (hard of hearing), Hypertension, Mental developmental delay, Seizures (CMS-HCC), and Ulcerative colitis (CMS-HCC).  Past Surgical History:   has a past surgical history that includes Adenoidectomy; pr colonoscopy w/biopsy single/multiple (N/A, 02/12/2017); pr colonoscopy flx dx w/collj spec when pfrmd (N/A, 08/19/2017); and pr sigmoidoscopy flx dx w/collj spec br/wa if pfrmd (N/A, 06/21/2018).   Previous admit date: 09/29/2018    Primary Insurance- Payor: MEDICAID [place] / Plan: MEDICAID [place] ACCESS / Product Type: *No Product type* /   Secondary Insurance ??? None  Prescription Coverage ???   Preferred Pharmacy - [facility] [phone] - [place], [place] - [address] AT [facility] [address]  [facility]  [facility]    Transportation home: Private vehicle  Level of function prior to admission: Independent        Financial Information    Need for financial assistance?: No    Social Determinants of Health  Social Determinants of Health were addressed in provider documentation.  Please refer to patient history.    Discharge Needs Assessment  Concerns to be Addressed: no discharge needs identified    Clinical Risk Factors: Multiple Diagnoses (  Chronic)    Barriers to taking medications: No    Prior overnight hospital stay or ED visit in last 90 days: Yes(Patient was recently admitted to [facility] from 4/22 to 4/24.)    Readmission Within the Last 30 Days: previous discharge plan unsuccessful    Anticipated Changes Related to Illness: none    Equipment Needed After Discharge: none    Discharge Facility/Level of Care Needs: (Discharge plan is to return home with mother.)    Readmission  Risk of Unplanned Readmission Score: UNPLANNED READMISSION SCORE: 18%  Predictive Model Details           20% (Medium) Factors Contributing to Score   Calculated 10/04/2018 09:18 22% Number of ED visits in last six months is 4   [facility] Risk of Unplanned Readmission Model 19% Number of active Rx orders is 28     9% Number of hospitalizations in last year is 2     9% Active antipsychotic Rx order is present     9% ECG/EKG order is present in last 6 months     6% Imaging order is present in last 6 months     6% Latest hemoglobin is low (9.4 g/dL)     6% Phosphorous result is present     Readmitted Within the Last 30 Days? (No if blank) Yes  Patient at risk for readmission?: N/A    Discharge Plan    Discharge plan is to return home with mom.    Expected Discharge Date: (TBD) CM waiting on MD to run list.    Expected Transfer from Critical Care:              Initial Assessment complete?: Yes

## 2018-10-04 NOTE — Unmapped (Addendum)
Medicine hospital course:  Austin [name] is a multimorbid 28 y.o. 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 Difficile infection who presented as a bounceback to the Medicine W service on 10/14/18 with recurrence of abdominal pain and frequent bloody diarrhea despite compliance with PO vancomycin. CT abdomen with slightly worsened inflammatory changes in rectosigmoid area compared to recent CT 5 days prior. Patient was admitted for IVF's and monitoring of diarrhea while ensuring compliance with Vancomycin. Unfortunately, the frequency of his bloody diarrhea did not improve, and his CRP rose to 118. GI performed flexible sigmoidoscopy on 4/29, which showed severe colitis to transverse colon, and biopsy taken. Pathology was consistent with severe chronic active colitis, no evidence of CMV. GI surgery was consulted, who planned to take him to their service on Monday 10/11/2018 for a total colectomy.     Surgery hospital course:  Austin [name] was taken to the OR on 10/11/2018 for a total abdominal colectomy with end ileostomy. He tolerated the procedure well, was extubated in the OR, and was taken to the PACU and received routine postoperative care before being transferred to the floor. He required an I/O catheterization once during his POD 1 trial of void, but was able to void without difficulty after the catheterization. He developed nausea and emesis on POD 2***.  His diet was slowly advanced and at the time of discharge, he was tolerating a regular diet. Pain medications were transitioned to oral without difficulty. Wound and ostomy nurses taught the patient and his mother how to change his ostomy appliance and his mother was able to demonstrate pouch changes appropriately. He is being discharged on 10/14/2018.

## 2018-10-04 NOTE — Unmapped (Signed)
Pt is injury free, alert, and oriented x 4. Pt's diarrhea remains the same with complains of abdominal discomfort and cramps (MD was notified). Pt is afebrile and denies any chest pain, nor SOB. Pt complained of nausea one time which improved with one dose of Zofran. Pt been NPO since midnight for possible GI procedure this morning. Pt's vitals remains within baseline, will continue to monitor.

## 2018-10-04 NOTE — Unmapped (Signed)
Medicine History and Physical    Assessment/Plan:    Principal Problem:    Ulcerative colitis (CMS-HCC)  Active Problems:    Bipolar 1 disorder (CMS-HCC)    Asthma    ADHD (attention deficit hyperactivity disorder)    C. difficile diarrhea  Resolved Problems:    * No resolved hospital problems. *      Chuck Hint is a 28 y.o. male with PMHx as reviewed in the EMR that presented to [facility] with Ulcerative colitis (CMS-HCC).    28 y.o.??male??with a hx notable for Ulcerative Colitis, ADHD, Seizure disorder, Developmental delay, Bipolar disorder, hard of hearing and GERD recently admitted 4/22-4/24 for UC flair vs C-diff colitis now presenting again for persistent diarrhea and lower abdominal pain c/f likely UC flare and Rectosigmoid colitis    Rectosigmoid Colitis - UC Flare - Possible superimposed C-diff colitis: Ongoing bloody diarrhea up to 10x daily per patient's mother. CT abdomen with slightly worsened inflammatory changes in rectosigmoid area compared to recent CT 5 days ago. CRP 35.9. WBC 6.5.  Reportedly had some response after IV solumedrol on 4/22 but this was discontinued once C-diff PCR was found to be positive. On discharge he had very little diarrhea and has been complaint with PO vanc at home. Suspect that driving factor at this time is likely poorly controlled UC. Certainly possible that he has superimposed C-diff but reassuring WBC count wnl, he remains afebrile. Additionally does have prior hx of C-diff colitis so colonization is certainly possible.    - c/w PO vanc, will not escalate at this time as low likelihood that C-diff is driving his worsening sxs  - NPO, consult GI in AM for possible Flex Sig  - Will need to consider risk vs benefits of retrialing steroids pending findings of flex sig  - Tylenol 1000mg  TID, avoid opiods  - High risk for DVT, however also with bloody output --> SCDs for now  - Replete K/Mg  - Zofran prn for n/v, reviewed imaging with radiology, no evidence of gastric or small bowel inflammatory changes  - LR 1L bolus, and then 100cc/hr  - c/w home xeljanz (will need to bring med from home)  - c/w bactrim Ppx      Mood disorder??/ Bipolar Disorder: C/w paliperidone, sertraline, trazodone.  ??  Asthma: Continue home PRN albuterol, PO claritin, and PO montelukast     DVT PPx: SCDs  Full Code  CLD until midnight, then NPO  Replete K      ___________________________________________________________________    Chief Complaint:  Chief Complaint   Patient presents with   ??? Light Headed   ??? Abdominal Pain     Ulcerative colitis (CMS-HCC)    HPI:  28 y.o.??male??with a hx notable for Ulcerative Colitis, ADHD, Seizure disorder, Developmental delay, Bipolar disorder, hard of hearing and GERD recently admitted 4/22-4/24 for UC flair vs C-diff colitis now presenting again for persistent diarrhea and lower abdominal pain.     During prior admission patient initially presented with progressively worsening diarrhea, reportedly up to 10 BMs per day, reported bloody for several days. His work-up was remarkable for normal WBC count, CRP of 19 and positive c-diff PCR. Prior to starting treatment for C-diff he did receive IV solumedrol 80mg  x 1 which was eventually discontinued and he was started on PO vancomycin. Diarrhea improved significantly and he was discharged w/ plan to continue PO vancomycin, holding home steroids and continue on home [name]    He is now re-presenting with worsening bloody  diarrhea reportedly up-to 10 times a day with b/l lower abdominal pain worse on RLQ and associated with nausea and nb,nb emesis x 2, decreased PO intake, generalized malaise and fatigue. No fevers or chills at home. They report compliance with PO vanc as instructed.     Past UC treatments have included infliximab (c/b Cdiff, autoimmune hpatitis vs DILI), vedolizumab w/ loss of response, and most recently [place] since 07/2018. Last flexible sigmoidoscopy was June 21, 2018 with severe Mayo 3 colitis from the anus to the transverse colon.        Allergies:  Infliximab; Nsaids (non-steroidal anti-inflammatory drug); Pineapple; Strawberry; Tolmetin; Tomato; Amoxicillin-pot clavulanate; and Omeprazole    Medications:   Prior to Admission medications    Medication Dose, Route, Frequency   albuterol (PROVENTIL HFA;VENTOLIN HFA) 90 mcg/actuation inhaler 2 puffs, Inhalation, Every 4 hours PRN   albuterol 2.5 mg /3 mL (0.083 %) nebulizer solution 2.5 mg, Inhalation, Every 4 hours PRN   cholecalciferol, vitamin D3, (CHOLECALCIFEROL) 1,000 unit tablet 1,000 Units, Oral, Daily (standard)   EPINEPHrine (EPIPEN) 0.3 mg/0.3 mL injection 0.3 mg, Intramuscular   famotidine (PEPCID) 10 MG tablet 20 mg, Oral, 2 times a day (standard)   loratadine (CLARITIN) 10 mg tablet 10 mg, Oral, Daily (standard)   montelukast (SINGULAIR) 10 mg tablet 10 mg, Oral, Nightly   ondansetron (ZOFRAN) 4 MG tablet 4 mg, Oral, Every 8 hours PRN   paliperidone (INVEGA) 3 MG 24 hr tablet 3 mg, Oral, Daily   propranolol (INDERAL) 10 MG tablet TK 1 T PO TID   sertraline (ZOLOFT) 100 MG tablet 75 mg, Oral, Daily (standard)   sulfamethoxazole-trimethoprim (BACTRIM) 400-80 mg per tablet 1 tablet, Oral, Daily   tofacitinib 10 mg Tab 10 mg, Oral, 2 times a day (standard)   traZODone (DESYREL) 50 MG tablet 50 mg, Oral, Nightly   vancomycin (VANCOCIN) 125 MG capsule 125 mg, Oral, 4 times a day       Medical History:  Past Medical History:   Diagnosis Date   ??? ADHD    ??? Asthma    ??? Bipolar 1 disorder (CMS-HCC)    ??? Depression    ??? GERD (gastroesophageal reflux disease)    ??? HOH (hard of hearing)    ??? Hypertension    ??? Mental developmental delay    ??? Seizures (CMS-HCC)     last seizures 2016   ??? Ulcerative colitis (CMS-HCC)        Surgical History:  Past Surgical History:   Procedure Laterality Date   ??? ADENOIDECTOMY     ??? PR COLONOSCOPY FLX DX W/COLLJ SPEC WHEN PFRMD N/A 08/19/2017    Procedure: COLONOSCOPY, FLEXIBLE, PROXIMAL TO SPLENIC FLEXURE; DIAGNOSTIC, W/WO COLLECTION SPECIMEN BY BRUSH OR WASH;  Surgeon: Bluford Kaufmann, MD;  [facility];  Service: Gastroenterology   ??? PR COLONOSCOPY W/BIOPSY SINGLE/MULTIPLE N/A 02/12/2017    Procedure: COLONOSCOPY, FLEXIBLE, PROXIMAL TO SPLENIC FLEXURE; WITH BIOPSY, SINGLE OR MULTIPLE;  Surgeon: Rona Ravens, MD;  [facility];  Service: Gastroenterology   ??? PR SIGMOIDOSCOPY FLX DX W/COLLJ SPEC BR/WA IF PFRMD N/A 06/21/2018    Procedure: SIGMOIDOSCOPY, FLEXIBLE; DIAGNOSTIC, WITH OR WITHOUT COLLECTION OF SPECIMEN(S) BY BRUSHING OR WASHING;  Surgeon: Cletis Athens, MD;  [facility];  Service: Gastroenterology       Social History:  Social History     Socioeconomic History   ??? Marital status: Legally Separated     Spouse name: Not on  file   ??? Number of children: Not on file   ??? Years of education: Not on file   ??? Highest education level: Not on file   Occupational History   ??? Not on file   Social Needs   ??? Financial resource strain: Not on file   ??? Food insecurity     Worry: Not on file     Inability: Not on file   ??? Transportation needs     Medical: Not on file     Non-medical: Not on file   Tobacco Use   ??? Smoking status: Never Smoker   ??? Smokeless tobacco: Never Used   Substance and Sexual Activity   ??? Alcohol use: No   ??? Drug use: No   ??? Sexual activity: Not on file   Lifestyle   ??? Physical activity     Days per week: Not on file     Minutes per session: Not on file   ??? Stress: Not on file   Relationships   ??? Social Wellsite geologist on phone: Not on file     Gets together: Not on file     Attends religious service: Not on file     Active member of club or organization: Not on file     Attends meetings of clubs or organizations: Not on file     Relationship status: Not on file   Other Topics Concern   ??? Not on file   Social History Narrative   ??? Not on file       Family History:  Family History   Problem Relation Age of Onset   ??? Cancer Paternal Uncle unknown, but has an ostomy   ??? Crohn's disease Paternal Aunt    ??? Cancer Maternal Uncle         colon   ??? Ulcerative colitis Neg Hx        Review of Systems:  10 systems reviewed and are negative unless otherwise mentioned in HPI    Labs/Studies:  Labs and Studies from the last 24hrs per EMR and Reviewed    Physical Exam:  Temp:  [36.7 ??C-37.2 ??C] 37.2 ??C  Heart Rate:  [70-97] 91  Resp:  [16-17] 16  BP: (102-137)/(55-96) 109/62  SpO2:  [96 %-100 %] 97 %  Physical Exam   Constitutional: He is oriented to person, place, and time. No distress.   Obese male, in mild distress due to pain, cooperative and calm   HENT:   Head: Normocephalic and atraumatic.   Dry oropharynx    Eyes: Pupils are equal, round, and reactive to light. EOM are normal.   Neck: Normal range of motion. Neck supple.   Cardiovascular: Normal rate and regular rhythm.   No murmur heard.  Pulmonary/Chest: Effort normal and breath sounds normal. No respiratory distress. He has no wheezes. He has no rales.   Abdominal: Soft. Bowel sounds are normal.   RLQ ttp, otherwise soft, non-tender    Musculoskeletal: Normal range of motion.         General: No deformity or edema.   Neurological: He is alert and oriented to person, place, and time.   Skin: Skin is warm and dry.   Psychiatric:   Flat affect, withdrawn at times

## 2018-10-04 NOTE — Unmapped (Signed)
Daily Progress Note    Assessment/Plan:    Principal Problem:    Ulcerative colitis (CMS-HCC)  Active Problems:    Bipolar 1 disorder (CMS-HCC)    Asthma    ADHD (attention deficit hyperactivity disorder)    C. difficile diarrhea       LOS: 1 day     28 y.o.??male??with a hx notable for Ulcerative Colitis, ADHD, Seizure disorder, Developmental delay, Bipolar disorder, hard of hearing and GERD recently admitted 4/22-4/24 for UC flair vs C-diff colitis now presenting again for persistent diarrhea and lower abdominal pain c/f UC flare and infectious rectosigmoid colitis.   ??  Rectosigmoid Colitis - UC Flare - C-diff colitis: Ongoing bloody diarrhea up to 10x daily per patient's mother. CT abdomen with slightly worsened inflammatory changes in rectosigmoid area compared to recent CT 5 days ago. CRP 35.9. WBC 6.5.  On recent discharge he had very little diarrhea and has been complaint with PO vanc at home.  - CTM diarrhea while continuing PO vancomycin   - NPO at [facility] plans for flex sig tomorrow.   - Tylenol 1000mg  TID, avoid opiods  - High risk for DVT, however also with bloody output --> SCDs for now  - Replete K/Mg  - Zofran prn for n/v  - Unable to continue home xeljanz, as he is unable to bring his home medications in  - c/w bactrim Ppx  ??  Mood disorder??/ Bipolar Disorder: C/w risperidone (in replacement of non formulary paliperidone), sertraline, trazodone.  ??  Asthma: Continue home PRN albuterol, PO claritin, and PO montelukast   ??  DVT PPx: SCDs  Full Code  NPO at [place]     Subjective:     Continues to have diarrhea. Scheduled vancomycin being given. Also complaining of continued RLQ pain (which he reports is unchanged from before).     Objective:     Vital signs in last 24 hours:  Temp:  [36.5 ??C-37.2 ??C] 36.9 ??C  Heart Rate:  [68-97] 68  Resp:  [16-17] 16  BP: (102-137)/(55-96) 120/60  MAP (mmHg):  [79-104] 89  SpO2:  [96 %-100 %] 96 %  BMI (Calculated):  [37.29] 37.29    Intake/Output last 3 shifts:  I/O last 3 completed shifts:  In: 988 [I.V.:988]  Out: -     Physical Exam:  General appearance: alert and no acute distress  Abdomen: soft, normoactive bowel sounds, mildly tender in RLQ   Extremities: extremities normal, atraumatic, no cyanosis or edema

## 2018-10-04 NOTE — Unmapped (Signed)
No acute events this shift; remains on contact enteric precautions for + C-Diff. VSS, safety/falls precautions maintained throughout shift. Pt has flat affect and appears sad; encouraged to report needs to RN. PRN Zofran & scheduled Tylenol given for reports of nausea/abdominal cramping, with relief. Pt continues to have multiple incidents of diarrhea, provider aware. NPO for potential procedure. WCTM    Problem: Adult Inpatient Plan of Care  Goal: Plan of Care Review  Outcome: Ongoing - Unchanged  Goal: Patient-Specific Goal (Individualization)  Outcome: Ongoing - Unchanged  Goal: Absence of Hospital-Acquired Illness or Injury  Outcome: Ongoing - Unchanged  Goal: Optimal Comfort and Wellbeing  Outcome: Ongoing - Unchanged  Goal: Readiness for Transition of Care  Outcome: Ongoing - Unchanged  Goal: Rounds/Family Conference  Outcome: Ongoing - Unchanged     Problem: Infection  Goal: Infection Symptom Resolution  Outcome: Ongoing - Unchanged     Problem: Pain Acute  Goal: Optimal Pain Control  Outcome: Ongoing - Unchanged

## 2018-10-04 NOTE — Unmapped (Signed)
.  Patient rounding complete, call bell in reach, bed locked and in lowest position, patient belongings at bedside and within reach of patient.  Patient updated on plan of care.      Patient appears sleeping quietly.

## 2018-10-04 NOTE — Unmapped (Signed)
GASTROENTEROLOGY INPATIENT CONSULTATION H&P      Requesting Attending Physician:  Santa Genera, MD  Requesting Consult Service: [facility] (MDW)    Reason for Consult:    [name] is a 28 y.o. [name] seen in consultation at the request of Dr. Santa Genera, MD for colitis.    Assessment and Recommendations:   Austin [name] is a 28 y.o. [name] with a hx notable for ADHD, Seizure disorder, Developmental delay, Bipolar disorder, hard of hearing and GERD.  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  He had mod-severe colitis flare fall 2019 that did not respond to Vedolizumab re-load and prednisone.  [name] and  prednisone taper was started Feb 2020 with good response. He was recently admitted with diarrhea and found to have C diff +, with improvement after PO vanc initiation. He returns several days after discharge with 6 daily stools, 50% with blood. Considering his severe UC course there is concern that his recent symptoms have been driven by a severe UC flare, rather than purely C diff. We will monitor symptoms on PO vanc today to see if there is a compliance issue,  with plans to do a flex sig tomorrow if no improvement inpatient on PO vanc.    - continue Xeljanz 10 BID  - Hold steroid therapy, cont vancomycin therapy  - daily CMP, CBC, CRP  - Monitor clinical course in next 48 hours and likely flex sig if no improvement    Thank you for this consult. We will continue to follow along. Please page the [facility] pager at with any further questions.      Rutherford Limerick, MD  Fellow, PGY-4  [employer] [facility]  Division of Gastroenterology & Hepatology    History of Present Illness:      Chief Complaint: diarrhea    HPI:   This is a 28 y.o. [name] with a history of ulcerative colitis, seizure disorder, bipolar disorder, developmental delay who presents with worsening abdominal pain and diarrhea.    He follows with Dr. Raphael Gibney and was originally diagnosed back in 2015 with diarrhea and hematochezia.  He was initially prescribed budesonide and mesalamine which did not work and then he was off medications for 1 to 2 years after that.  In 2017 he was started on infliximab with improvement but his course was complicated by C. difficile.  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  In 2019 he was off vedolizumab and had secondary loss of response.  He was admitted to [facility] January 2020 with severe colitis and got IV steroids with improvement and was transitioned to [place] 10 mg twice daily February 2020.  His last flexible sigmoidoscopy was June 21, 2018 with severe Mayo 3 colitis from the anus to the transverse colon.    He was last seen by Dr. Raphael Gibney on 4/7 where he was noted to have good response to [facility] with plans to drop Xeljanz dose to 10 mg twice daily in early May. During recent hospitalization he was started on PO vanc for his c diff colitis with significant improvement. During that hospitalization his diarrhea had subsided and his abdominal pain resolved.    He was admitted yesterday with  diarrhea over the last 24-36 hours, 6 bowel movements daily. He reports compliance with the po vancomycin but says that despite this he has had blood in 50% of his bowel movements and some right sided abdominal cramping. He reports the stool as frank liquid. No nausea or vomiting. In the ED, his VSS were stable and lab work up notable for a WBC 7, hgb 9.4, plt 342, K 3.3 and a CRP 35.9 up from 17.6 on discharge 4/24. He had a CT scan in the ED that showed a fluid filled colon with infectious vs inflammatory colitis.     Review of Systems:  The balance of 12 systems reviewed is negative except as noted in the HPI. Medical History:   Allergies:  Infliximab; Nsaids (non-steroidal anti-inflammatory drug); Pineapple; Strawberry; Tolmetin; Tomato; Amoxicillin-pot clavulanate; and Omeprazole    Medications:   Prior to Admission medications    Medication Dose, Route, Frequency   albuterol (PROVENTIL HFA;VENTOLIN HFA) 90 mcg/actuation inhaler 2 puffs, Inhalation, Every 4 hours PRN   albuterol 2.5 mg /3 mL (0.083 %) nebulizer solution 2.5 mg, Inhalation, Every 4 hours PRN   cholecalciferol, vitamin D3, (CHOLECALCIFEROL) 1,000 unit tablet 1,000 Units, Oral, Daily (standard)   EPINEPHrine (EPIPEN) 0.3 mg/0.3 mL injection 0.3 mg, Intramuscular   famotidine (PEPCID) 10 MG tablet 20 mg, Oral, 2 times a day (standard)   loratadine (CLARITIN) 10 mg tablet 10 mg, Oral, Daily (standard)   montelukast (SINGULAIR) 10 mg tablet 10 mg, Oral, Nightly   ondansetron (ZOFRAN) 4 MG tablet 4 mg, Oral, Every 8 hours PRN   paliperidone (INVEGA) 3 MG 24 hr tablet 3 mg, Oral, Daily   propranolol (INDERAL) 10 MG tablet TK 1 T PO TID   sertraline (ZOLOFT) 100 MG tablet Austin mg, Oral, Daily (standard)   sulfamethoxazole-trimethoprim (BACTRIM) 400-80 mg per tablet 1 tablet, Oral, Daily   tofacitinib 10 mg Tab 10 mg, Oral, 2 times a day (standard)   traZODone (DESYREL) 50 MG tablet 50 mg, Oral, Nightly   vancomycin (VANCOCIN) 125 MG capsule 125 mg, Oral, 4 times a day       Medical History:  Past Medical History:   Diagnosis Date   ??? ADHD    ??? Asthma    ??? Bipolar 1 disorder (CMS-HCC)    ??? Depression    ??? GERD (gastroesophageal reflux disease)    ??? HOH (hard of hearing)    ??? Hypertension    ??? Mental developmental delay    ??? Seizures (CMS-HCC)     last seizures 2016   ??? Ulcerative colitis (CMS-HCC)        Surgical History:  Past Surgical History:   Procedure Laterality Date   ??? ADENOIDECTOMY     ??? PR COLONOSCOPY FLX DX W/COLLJ SPEC WHEN PFRMD N/A 08/19/2017    Procedure: COLONOSCOPY, FLEXIBLE, PROXIMAL TO SPLENIC FLEXURE; DIAGNOSTIC, W/WO COLLECTION SPECIMEN BY BRUSH OR WASH;  Surgeon: Bluford Kaufmann, MD;  [facility];  Service: Gastroenterology   ??? PR COLONOSCOPY W/BIOPSY SINGLE/MULTIPLE N/A 02/12/2017    Procedure: COLONOSCOPY, FLEXIBLE, PROXIMAL TO SPLENIC FLEXURE; WITH BIOPSY, SINGLE OR MULTIPLE;  Surgeon: Rona Ravens, MD;  Location: [facility];  Service: Gastroenterology   ??? PR SIGMOIDOSCOPY FLX DX W/COLLJ SPEC BR/WA IF PFRMD N/A 06/21/2018    Procedure: SIGMOIDOSCOPY, FLEXIBLE; DIAGNOSTIC, WITH OR WITHOUT COLLECTION OF SPECIMEN(S) BY BRUSHING OR WASHING;  Surgeon: Cletis Athens, MD;  Location: [facility];  Service: Gastroenterology  Social History:  Tobacco use:   reports that he has never smoked. He has never used smokeless tobacco.  Alcohol use:   reports no history of alcohol use.  Drug use:  reports no history of drug use.    Family History:  Family History   Problem Relation Age of Onset   ??? Cancer Paternal Uncle         unknown, but has an ostomy   ??? Crohn's disease Paternal Aunt    ??? Cancer Maternal Uncle         colon   ??? Ulcerative colitis Neg Hx        Review of Systems:  10 systems reviewed and are negative unless otherwise mentioned in HPI    Objective:    Vital Signs/Weight:  Temp:  [36.5 ??C-37.2 ??C] 36.9 ??C  Heart Rate:  [68-97] 68  Resp:  [16-17] 16  BP: (102-137)/(55-96) 120/60  MAP (mmHg):  [79-104] 89  SpO2:  [96 %-100 %] 96 %  BMI (Calculated):  [37.29] 37.29  Wt Readings from Last 3 Encounters:   10/03/18 (!) 112.9 kg (248 lb 14.4 oz)   09/30/18 (!) 115.2 kg (253 lb 15.5 oz)   08/03/18 (!) 114.6 kg (252 lb 9.6 oz)       Physical Exam:  Constitutional: Pleasant young [name] , sleepy  HEENT: Anicteric sclera  CV: Regular rate and rhythm  Lung: Clear to auscultation bilaterally. Unlabored breathing.   Abdomen: mild right sided abdominal tenderness, no rebound or guarding, + BS  Extremities: No edema, well perfused  MSK: No joint swelling or tenderness noted, no deformities  Skin: No rashes, jaundice  Neuro: No focal deficits. No asterixis.   Mental Status: Alert and oriented to person place and time    Diagnostic Studies:  I reviewed all pertinent diagnostic studies, including:      Labs:    Recent Labs     10/03/18  1343 10/04/18  0632   WBC 6.4 7.0   HGB 10.3* 9.4*   HCT 30.4* 28.3*   PLT 368 342     Recent Labs     10/03/18  1343 10/04/18  0632   NA 141 140   K 3.4* 3.3*   CL 107 104   BUN 4* 5*   CREATININE 0.83 0.82   GLU 96 79     Recent Labs     10/03/18  1343 10/04/18  0632   PROT 6.3* 5.8*   ALBUMIN 3.4* 3.0*   AST 15* 12*   ALT 8 6   ALKPHOS 52 49   BILITOT 0.3 0.3       Recent Labs     10/03/18  1343   CRP 35.9*       Lab Results   Component Value Date    SMOOTHMUSCAB Negative 01/26/2017    IGG 1,338 01/26/2017    MITOAB Positive (A) 01/26/2017       Imaging:   EXAM: CT ABDOMEN PELVIS W CONTRAST  DATE: 10/03/2018 5:03 PM  ACCESSION: [id number]  DICTATED: 10/03/2018 5:32 PM  INTERPRETATION LOCATION: [facility]  ??  CLINICAL INDICATION: diffuse abd pain (mostly R sided), V/D with hx of UC and current c diff infection ; Abdominal pain, acute, nonlocalized    ??  COMPARISON: CT abdomen and pelvis dated 06/17/2018  ??  TECHNIQUE: A spiral CT scan of the abdomen and pelvis was obtained with IV contrast from the lung bases through the pubic symphysis. Images were reconstructed in  the axial plane. Coronal and sagittal reformatted images were also provided for further evaluation.  ??  FINDINGS:   LOWER THORAX: Dependent atelectasis bilaterally. Otherwise, the visualized lower chest is unremarkable.  ??  ABDOMEN/PELVIS:  HEPATOBILIARY: No focal hepatic lesions. The gallbladder is present and otherwise unremarkable. No biliary ductal dilatation.    SPLEEN: Unremarkable.  PANCREAS: No focal mass or ductal dilatation. No peripancreatic fluid or adjacent inflammatory stranding.  ??  ADRENALS: Unremarkable.  KIDNEYS/URETERS: Symmetric nephrograms. No enhancing solid renal mass. Nonobstructing renal calculus within lower pole left kidney (2:48), measures 3 mm. No evidence of hydronephrosis.  ??  BLADDER: Mostly decompressed, limiting evaluation.  PELVIC/REPRODUCTIVE ORGANS: Unremarkable appearance of the prostate and seminal vesicles. No free fluid within the pelvis.  ??  GI TRACT:   No evidence of bowel obstruction. Loops of nondistended, fluid-filled, colon are noted throughout the abdomen with mild circumferential wall thickening extending from the level of the transverse colon to the rectum similar to prior possibly reflecting nonspecific colitis. Mild fatty infiltration of the rectum and sigmoid colon wall, unchanged. No adjacent inflammatory stranding or focal fluid collection is identified. The appendix is unremarkable.  ??  PERITONEUM/RETROPERITONEUM AND MESENTERY: No ascites. No pneumoperitoneum.  LYMPH NODES: Multiple prominent, but not pathologically enlarged mesenteric lymph nodes which may be reactive.   VESSELS: The aorta is normal in caliber.  No significant calcified atherosclerotic disease. The portal, splenic, and superior mesenteric veins are patent. IVC is unremarkable.  ??  BONES AND SOFT TISSUES: Bilateral gynecomastia. No soft tissue abnormality. No acute osseous abnormality. No worrisome osseous lesion.  ??  IMPRESSION:  - Loops of nondistended, fluid-filled, colon with mild circumferential wall thickening extending from level of the transverse colon to the rectum. These findings are nonspecific, however, could be seen in the setting of infectious or inflammatory colitis.  - Mild fatty infiltration of the sigmoid colon and rectal wall similar to prior compatible with chronic inflammatory changes from patient's reported history of ulcerative colitis.  ??  ====================  ADDENDUM (10/03/2018 5:48 PM): Dr.Altun dictating.  I agree with the report. There is increased wall thickening and enhancement of the rectosigmoid colon compared to the prior suggestive of inflammation. There is also likely mild inflammation of the descending colon which is overall stable to slightly improved compared to prior. These findings could be seen secondary to active inflammation of ulcerative colitis although superimposed infectious colitis cannot be excluded.  ??  Fluid-filled colon could also be associated with diarrheal state.    GI Procedures:   06/2018 flex sig    Findings:       The perianal and digital rectal examinations were normal.       Inflammation characterized by congestion (edema), erosions, erythema,        friability, loss of vascularity and mucus was found in a continuous and        circumferential pattern from the anus to the transverse colon (proximal        extent of exam). No sites were spared. This was severe and graded as        Mayo Score 3 (severe, with spontaneous bleeding, ulcerations), and when        compared to previous examinations, the findings are worsened.       Retroflexion not performed due to severe degree of inflammation  Impression:        - Ulcerative colitis. Inflammation was found from the anus                      to the transverse colon. This was severe and graded as                      Mayo Score 3 (severe disease), worsened compared to                      previous examinations.                     - No specimens collected.

## 2018-10-04 NOTE — Progress Notes (Signed)
REVIEWED-NO ADDITIONAL RECOMMENDATIONS. 

## 2018-10-05 LAB — CBC
HEMATOCRIT: 29 % — ABNORMAL LOW (ref 41.0–53.0)
HEMOGLOBIN: 9.5 g/dL — ABNORMAL LOW (ref 13.5–17.5)
MEAN CORPUSCULAR HEMOGLOBIN CONC: 32.9 g/dL (ref 31.0–37.0)
MEAN CORPUSCULAR HEMOGLOBIN: 27.7 pg (ref 26.0–34.0)
MEAN CORPUSCULAR VOLUME: 84.2 fL (ref 80.0–100.0)
MEAN PLATELET VOLUME: 8.2 fL (ref 7.0–10.0)
MEAN PLATELET VOLUME: 8.2 fL — ABNORMAL LOW (ref 7.0–10.0)
RED BLOOD CELL COUNT: 3.44 10*12/L — ABNORMAL LOW (ref 4.50–5.90)
RED CELL DISTRIBUTION WIDTH: 15.5 % — ABNORMAL HIGH (ref 12.0–15.0)
WBC ADJUSTED: 7 10*9/L (ref 4.5–11.0)

## 2018-10-05 LAB — COMPREHENSIVE METABOLIC PANEL
ALBUMIN: 3 g/dL — ABNORMAL LOW (ref 3.5–5.0)
ALKALINE PHOSPHATASE: 53 U/L (ref 38–126)
ALT (SGPT): 5 U/L (ref <50)
ANION GAP: 13 mmol/L (ref 7–15)
ANION GAP: 13 mmol/L — ABNORMAL LOW (ref 7–15)
AST (SGOT): 12 U/L — ABNORMAL LOW (ref 19–55)
BILIRUBIN TOTAL: 0.2 mg/dL (ref 0.0–1.2)
BLOOD UREA NITROGEN: 5 mg/dL — ABNORMAL LOW (ref 7–21)
CALCIUM: 8.9 mg/dL (ref 8.5–10.2)
CHLORIDE: 106 mmol/L (ref 98–107)
CREATININE: 0.88 mg/dL (ref 0.70–1.30)
EGFR CKD-EPI AA MALE: >90 mL/min/{1.73_m2} (ref >=60)
EGFR CKD-EPI NON-AA MALE: >90 mL/min/{1.73_m2} (ref >=60)
GLUCOSE RANDOM: 82 mg/dL (ref 70–179)
POTASSIUM: 3.4 mmol/L — ABNORMAL LOW (ref 3.5–5.0)
PROTEIN TOTAL: 6 g/dL — ABNORMAL LOW (ref 6.5–8.3)
SODIUM: 141 mmol/L (ref 135–145)

## 2018-10-05 NOTE — Unmapped (Signed)
Pt alert & oriented, VSS, afebrile, denied pain or discomfort. No BM this shift. NPO since midnight for flex sig procedure. Plan of care continued.     Problem: Adult Inpatient Plan of Care  Goal: Plan of Care Review  Outcome: Progressing  Goal: Patient-Specific Goal (Individualization)  Outcome: Progressing  Goal: Absence of Hospital-Acquired Illness or Injury  Outcome: Progressing  Goal: Optimal Comfort and Wellbeing  Outcome: Progressing  Goal: Readiness for Transition of Care  Outcome: Progressing  Goal: Rounds/Family Conference  Outcome: Progressing     Problem: Infection  Goal: Infection Symptom Resolution  Outcome: Progressing     Problem: Pain Acute  Goal: Optimal Pain Control  Outcome: Progressing

## 2018-10-05 NOTE — Unmapped (Signed)
Blood Bank Immunohematology Evaluation    Austin [name] types as O POS and has no history of RBC alloantibodies. In the patient???s sample from 09-30-2018, a warm autoantibody is identified.  This antibody reacts with all donor RBCs.  This antibody may be causing hemolysis of the patient???s own Red Blood Cells (but these antibodies do not universally do so).    Because this antibody reacts with Red Blood Cells from all donor units extra steps and time are required to more safely transfuse. If transfusion is needed, the patient will receive:    1. Compatible units crossmatched with his plasma after in vitro adsorption of the antibody (a very time-consuming lab procedure).   OR   2. The least incompatible units which are phenotypically matched to the patient???s Red Blood Cell antigens (which would prevent exposure to foreign antigens and development of antibodies against foreign RBC antigens).    During and after the transfusion, the patient should be monitored for the potential of hemolysis. The risk of hemolysis of the donor Red Blood Cells post-transfusion is similar to the risk of hemolysis of the patient???s own Red Blood Cells.    A conditional release card must be signed by the treating physician to release the Red Blood Cell units from Transfusion Medicine Service for transfusion.    Arcadia Gorgas A. Shawna Kiener, MD  Transfusion Medicine Attending

## 2018-10-05 NOTE — Unmapped (Signed)
Daily Progress Note    Assessment/Plan:    Principal Problem:    Ulcerative colitis (CMS-HCC)  Active Problems:    Bipolar 1 disorder (CMS-HCC)    Asthma    ADHD (attention deficit hyperactivity disorder)    C. difficile diarrhea       LOS: 2 days     28 y.o.??male??with a hx notable for severe Ulcerative Colitis, ADHD, Seizure disorder, Developmental delay, Bipolar disorder, hard of hearing and GERD recently admitted 4/22-4/24 for UC flair vs C-diff colitis now presenting again for persistent diarrhea and lower abdominal pain c/f UC flare and infectious rectosigmoid colitis.   ??  Rectosigmoid Colitis - UC Flare - C-diff colitis: Ongoing bloody diarrhea up to 10x daily per patient's mother. CT abdomen with slightly worsened inflammatory changes in rectosigmoid area compared to recent CT 5 days prior. Has been compliant with PO vancomycin at home, which has been continued here, but continues to have frequent watery bloody diarrhea, and CRP is rising.   - NPO at [place] for tentative flex sig tomorrow.   - Tylenol 1000mg  TID, avoid opiods  - High risk for DVT, however also with bloody output --> SCDs for now  - Replete K/Mg  - Zofran prn for n/v  - Continue home Xeljanz  - c/w bactrim Ppx  ??  Mood disorder??/ Bipolar Disorder: C/w risperidone (in replacement of non formulary paliperidone), sertraline, trazodone.  ??  Asthma: Continue home PRN albuterol, PO claritin, and PO montelukast   ??  DVT PPx: SCDs  Full Code  NPO at MN     Subjective:     Had 7 bowel movements yesterday, none overnight. One watery bloody bowel movement this morning. CRP rising. Abdominal pain is improved.     Objective:     Vital signs in last 24 hours:  Temp:  [36.6 ??C-37.4 ??C] 37.4 ??C  Heart Rate:  [84-94] 91  Resp:  [16-18] 16  BP: (114-133)/(60-67) 119/65  MAP (mmHg):  [83] 83  SpO2:  [96 %-98 %] 97 %    Intake/Output last 3 shifts:  I/O last 3 completed shifts:  In: 1278 [P.O.:290; I.V.:988]  Out: -     Physical Exam:  General appearance: alert and no acute distress  Abdomen: soft, normoactive bowel sounds, mildly tender in RLQ   Extremities: extremities normal, atraumatic, no cyanosis or edema

## 2018-10-05 NOTE — Unmapped (Signed)
F/U scheduled for 5/5 @ 3:30 pm with Dr. [facility].  This will be a virtual call.  Patient will be called the day before.  Please fax medical records and d/c summary to [phone], attention Malachi Bonds.  SH

## 2018-10-05 NOTE — Unmapped (Signed)
Adult Nutrition Assessment Note    Visit Type: RN Consult  Reason for Visit: Per Admission Nutrition Screen (Adult), Have you gained or lost 10 pounds in the past 3 months?    This patient was not seen in person. The clinical nutrition service has moved to a liaison model to minimize potential spread of COVID-19, protect patients/providers and reduce PPE utilization.  During this time, we will be limiting person-to-person contact when possible.      ASSESSMENT:   HPI & PMH: 28 y.o.??male??with a hx notable for severe??Ulcerative Colitis,??ADHD, Seizure disorder, Developmental delay, Bipolar disorder, hard of hearing and GERD recently admitted 4/22-4/24 for UC flair vs C-diff colitis now presenting again for persistent diarrhea and lower abdominal pain??c/f UC flare and infectious rectosigmoid colitis.    Nutrition Hx:  Spoke with Austin [name] over the phone today. He reports decreased appetite over the past month PTA. He endorses improved PO tolerance with most meals since admit. He reports 12 lb weight loss from 260 lb down to present weight of 248 lb in the past month. Per EMR weights have been stable at 248 lb. He has supplemented with boost and ensure in the past with good tolerance, but would prefer to focus on meals and request ensure prn while admit.     Nutritionally Pertinent Meds: 1000 IU Vit. D daily ; Pepcid ; reviewed   Labs: K 3.4 ; reviewed  Abd/GI: soft ; frequent small BMs per RN documentation     Skin:     Active Wounds     None               Current nutrition therapy order:   Nutrition Orders          NPO Sips with meds; Operating room: NPO starting at 04/29 0001    Nutrition Therapy General (Regular) starting at 04/28 1144           Anthropometric Data:  -- Height: 174 cm (5' 8.5)   -- Last recorded weight: (!) 112.9 kg (248 lb 14.4 oz)  -- Admission weight: (!) 112.9 kg (248 lb 14.4 oz)  -- IBW: 71.28 kg  -- Percent IBW: 158.39 %  -- BMI: Body mass index is 37.29 kg/m??.   -- Weight changes this admission:   Last 5 Recorded Weights    10/03/18 2034   Weight: (!) 112.9 kg (248 lb 14.4 oz)      -- Weight history PTA:   Wt Readings from Last 10 Encounters:   10/03/18 (!) 112.9 kg (248 lb 14.4 oz)   09/30/18 (!) 115.2 kg (253 lb 15.5 oz)   08/03/18 (!) 114.6 kg (252 lb 9.6 oz)   06/29/18 (!) 116.1 kg (256 lb)   06/21/18 (!) 116.1 kg (256 lb)   05/27/18 (!) 116.1 kg (256 lb)   05/18/18 (!) 115.3 kg (254 lb 3.2 oz)   04/21/18 (!) 112.5 kg (248 lb)   04/14/18 (!) 112.5 kg (248 lb)   04/07/18 (!) 111.9 kg (246 lb 12.8 oz)        Daily Estimated Nutrient Needs:   Energy: 2083 kcals [Per [place] [employer]-Jeor Equation using  , 113 kg (10/05/18 1639)]  Protein: 104-130 gm [(20-25% of kcal) using admission body weight, 113 kg (10/05/18 1639)]  Carbohydrate:   [45-60% of kcal]  Fluid:   [1 mL/kcal (maintenance)]     Nutrition Focused Physical Exam:  DIAGNOSIS:  Malnutrition Assessment using AND/ASPEN Clinical Characteristics:    Patient does not meet AND/ASPEN criteria for malnutrition at this time (10/05/18 1642)                       Overall nutrition impression:   - Predicted suboptimal energy intake related to increased nutritional needs; decreased appetite as evidenced by patient reported diet history     GOALS:  Oral Intake:       - Patient to consume 75% of 3 meals per day.     RECOMMENDATIONS AND INTERVENTIONS:  Recommend continue current nutrition therapy     RD Follow Up Parameters:  1-2 times per week (and more frequent as indicated)     Gladys Damme MS, RD, LD, CNSC  [phone]

## 2018-10-06 DIAGNOSIS — R112 Nausea with vomiting, unspecified: Principal | ICD-10-CM

## 2018-10-06 LAB — COMPREHENSIVE METABOLIC PANEL
ALBUMIN: 3 g/dL — ABNORMAL LOW (ref 3.5–5.0)
ALKALINE PHOSPHATASE: 60 U/L (ref 38–126)
ALT (SGPT): 5 U/L (ref <50)
ANION GAP: 11 mmol/L (ref 7–15)
AST (SGOT): 13 U/L — ABNORMAL LOW (ref 19–55)
BILIRUBIN TOTAL: <0.1 mg/dL (ref 0.0–1.2)
BLOOD UREA NITROGEN: 3 mg/dL — ABNORMAL LOW (ref 7–21)
BUN / CREAT RATIO: 4
CALCIUM: 8.8 mg/dL (ref 8.5–10.2)
CHLORIDE: 106 mmol/L (ref 98–107)
CO2: 23 mmol/L (ref 22.0–30.0)
CREATININE: 0.83 mg/dL (ref 0.70–1.30)
EGFR CKD-EPI AA MALE: >90 mL/min/{1.73_m2} (ref >=60)
EGFR CKD-EPI NON-AA MALE: >90 mL/min/{1.73_m2} (ref >=60)
EGFR CKD-EPI NON-AA MALE: >90 mL/min/1.73m2 — ABNORMAL LOW (ref >=60–1.2)
GLUCOSE RANDOM: 84 mg/dL (ref 70–179)
POTASSIUM: 3.4 mmol/L — ABNORMAL LOW (ref 3.5–5.0)
PROTEIN TOTAL: 6 g/dL — ABNORMAL LOW (ref 6.5–8.3)

## 2018-10-06 LAB — C-REACTIVE PROTEIN: C-REACTIVE PROTEIN: 118.7 mg/L — ABNORMAL HIGH (ref <10.0)

## 2018-10-06 LAB — CBC
HEMOGLOBIN: 10 g/dL — ABNORMAL LOW (ref 13.5–17.5)
HEMOGLOBIN: 10 g/dL — ABNORMAL LOW (ref 13.5–17.5)
MEAN CORPUSCULAR HEMOGLOBIN: 27.6 pg (ref 26.0–34.0)
MEAN CORPUSCULAR VOLUME: 84 fL (ref 80.0–100.0)
MEAN PLATELET VOLUME: 8 fL (ref 7.0–10.0)
PLATELET COUNT: 391 10*9/L (ref 150–440)
RED CELL DISTRIBUTION WIDTH: 15.7 % — ABNORMAL HIGH (ref 12.0–15.0)
WBC ADJUSTED: 6.6 10*9/L (ref 4.5–11.0)

## 2018-10-06 NOTE — Unmapped (Signed)
4 Loose stools today with streaks of blood.  Sigmoidoscopy planned for tomorrow. NPO at Midnight. No complaints of pain. Flat affect.     Problem: Adult Inpatient Plan of Care  Goal: Plan of Care Review  Outcome: Ongoing - Unchanged  Goal: Patient-Specific Goal (Individualization)  Outcome: Ongoing - Unchanged  Goal: Absence of Hospital-Acquired Illness or Injury  Outcome: Ongoing - Unchanged  Goal: Optimal Comfort and Wellbeing  Outcome: Ongoing - Unchanged  Goal: Readiness for Transition of Care  Outcome: Ongoing - Unchanged  Goal: Rounds/Family Conference  Outcome: Ongoing - Unchanged

## 2018-10-06 NOTE — Unmapped (Addendum)
Pt A&Ox4, VSS, no complaints during the shift. Pt remains quiet with flat affect, wctm.      Problem: Adult Inpatient Plan of Care  Goal: Plan of Care Review  Outcome: Ongoing - Unchanged  Goal: Patient-Specific Goal (Individualization)  Outcome: Ongoing - Unchanged  Goal: Absence of Hospital-Acquired Illness or Injury  Outcome: Ongoing - Unchanged  Goal: Optimal Comfort and Wellbeing  Outcome: Ongoing - Unchanged  Goal: Readiness for Transition of Care  Outcome: Ongoing - Unchanged  Goal: Rounds/Family Conference  Outcome: Ongoing - Unchanged     Problem: Infection  Goal: Infection Symptom Resolution  Outcome: Ongoing - Unchanged     Problem: Pain Acute  Goal: Optimal Pain Control  Outcome: Ongoing - Unchanged

## 2018-10-06 NOTE — Unmapped (Signed)
GASTROENTEROLOGY INPATIENT CONSULTATION H&P      Requesting Attending Physician:  Santa Genera, MD  Requesting Consult Service: [facility] (MDW)    Reason for Consult:    [name] is a 28 y.o. male seen in consultation at the request of Dr. Santa Genera, MD for colitis.    Interval History:    One bowel movement overnight. One BM this AM that was liquid but without frank blood. No abdominal pain her fevers. Uptrending CRP.    Assessment and Recommendations:   Austin [name] is a 28 y.o. male with a hx notable for ADHD, Seizure disorder, Developmental delay, Bipolar disorder, hard of hearing and GERD.  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  He had mod-severe colitis flare fall 2019 that did not respond to Vedolizumab re-load and prednisone.  Austin [name] and  prednisone taper was started Feb 2020 with good response. He was recently admitted with diarrhea and found to have C diff +, with improvement after PO vanc initiation. He returns several days after discharge with 6 daily stools. Considering his severe UC course there is concern that his recent symptoms have been driven by a severe UC flare, rather than purely C diff. His CRP increased overnight but bowel movements may be more formed, we plan on continued vanc treatment with plan for flex sig on 4/29 unless he improves substantially over the next 12-24 hours.     - continue Xeljanz 10 BID  - Hold steroid therapy, cont vancomycin therapy  - daily CMP, CBC, CRP  - Likely 4/29 flex sig unless stools become more formed with downtrending CRP    Thank you for this consult. We will continue to follow along. Please page the [facility] pager at with any further questions.      Rutherford Limerick, MD  Fellow, PGY-4  [employer] [facility]  Division of Gastroenterology & Hepatology    History of Present Illness:      Chief Complaint: diarrhea    HPI:   This is a 28 y.o. male with a history of ulcerative colitis, seizure disorder, bipolar disorder, developmental delay who presents with worsening abdominal pain and diarrhea.    He follows with Dr. Raphael Gibney and was originally diagnosed back in 2015 with diarrhea and hematochezia.  He was initially prescribed budesonide and mesalamine which did not work and then he was off medications for 1 to 2 years after that.  In 2017 he was started on infliximab with improvement but his course was complicated by C. difficile.  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  In 2019 he was off vedolizumab and had secondary loss of response.  He was admitted to [facility] January 2020 with severe colitis and got IV steroids with improvement and was transitioned to [place] 10 mg twice daily February 2020.  His last flexible sigmoidoscopy was June 21, 2018 with severe Mayo 3 colitis from the anus to the transverse colon.    He was last seen by Dr. Raphael Gibney on 4/7 where he was noted to have good response to [facility] with plans to drop Xeljanz dose to 10 mg twice daily in early May. During recent hospitalization he was started on PO vanc for  his c diff colitis with significant improvement. During that hospitalization his diarrhea had subsided and his abdominal pain resolved.    He was admitted yesterday with diarrhea over the last 24-36 hours, 6 bowel movements daily. He reports compliance with the po vancomycin but says that despite this he has had blood in 50% of his bowel movements and some right sided abdominal cramping. He reports the stool as frank liquid. No nausea or vomiting. In the ED, his VSS were stable and lab work up notable for a WBC 7, hgb 9.4, plt 342, K 3.3 and a CRP 35.9 up from 17.6 on discharge 4/24. He had a CT scan in the ED that showed a fluid filled colon with infectious vs inflammatory colitis.     Review of Systems:  The balance of 12 systems reviewed is negative except as noted in the HPI.     Medical History:   Allergies:  Infliximab; Nsaids (non-steroidal anti-inflammatory drug); Pineapple; Strawberry; Tolmetin; Tomato; Amoxicillin-pot clavulanate; and Omeprazole    Medications:   Prior to Admission medications    Medication Dose, Route, Frequency   albuterol (PROVENTIL HFA;VENTOLIN HFA) 90 mcg/actuation inhaler 2 puffs, Inhalation, Every 4 hours PRN   albuterol 2.5 mg /3 mL (0.083 %) nebulizer solution 2.5 mg, Inhalation, Every 4 hours PRN   cholecalciferol, vitamin D3, (CHOLECALCIFEROL) 1,000 unit tablet 1,000 Units, Oral, Daily (standard)   EPINEPHrine (EPIPEN) 0.3 mg/0.3 mL injection 0.3 mg, Intramuscular   famotidine (PEPCID) 10 MG tablet 20 mg, Oral, 2 times a day (standard)   loratadine (CLARITIN) 10 mg tablet 10 mg, Oral, Daily (standard)   montelukast (SINGULAIR) 10 mg tablet 10 mg, Oral, Nightly   ondansetron (ZOFRAN) 4 MG tablet 4 mg, Oral, Every 8 hours PRN   paliperidone (INVEGA) 3 MG 24 hr tablet 3 mg, Oral, Daily   propranolol (INDERAL) 10 MG tablet TK 1 T PO TID   sertraline (ZOLOFT) 100 MG tablet 75 mg, Oral, Daily (standard)   sulfamethoxazole-trimethoprim (BACTRIM) 400-80 mg per tablet 1 tablet, Oral, Daily   tofacitinib 10 mg Tab 10 mg, Oral, 2 times a day (standard)   traZODone (DESYREL) 50 MG tablet 50 mg, Oral, Nightly   vancomycin (VANCOCIN) 125 MG capsule 125 mg, Oral, 4 times a day       Medical History:  Past Medical History:   Diagnosis Date   ??? ADHD    ??? Asthma    ??? Bipolar 1 disorder (CMS-HCC)    ??? Depression    ??? GERD (gastroesophageal reflux disease)    ??? HOH (hard of hearing)    ??? Hypertension    ??? Mental developmental delay    ??? Seizures (CMS-HCC)     last seizures 2016   ??? Ulcerative colitis (CMS-HCC)        Surgical History:  Past Surgical History:   Procedure Laterality Date   ??? ADENOIDECTOMY     ??? PR COLONOSCOPY FLX DX W/COLLJ SPEC WHEN PFRMD N/A 08/19/2017    Procedure: COLONOSCOPY, FLEXIBLE, PROXIMAL TO SPLENIC FLEXURE; DIAGNOSTIC, W/WO COLLECTION SPECIMEN BY BRUSH OR WASH;  Surgeon: Bluford Kaufmann, MD;  [facility]: [facility];  Service: Gastroenterology   ??? PR COLONOSCOPY W/BIOPSY SINGLE/MULTIPLE N/A 02/12/2017    Procedure: COLONOSCOPY, FLEXIBLE, PROXIMAL TO SPLENIC FLEXURE; WITH BIOPSY, SINGLE OR MULTIPLE;  Surgeon: Rona Ravens, MD;  Location: [facility];  Service: Gastroenterology   ??? PR SIGMOIDOSCOPY FLX DX W/COLLJ SPEC BR/WA IF PFRMD N/A 06/21/2018    Procedure: SIGMOIDOSCOPY,  FLEXIBLE; DIAGNOSTIC, WITH OR WITHOUT COLLECTION OF SPECIMEN(S) BY BRUSHING OR WASHING;  Surgeon: Cletis Athens, MD;  [facility];  Service: Gastroenterology       Social History:  Tobacco use:   reports that he has never smoked. He has never used smokeless tobacco.  Alcohol use:   reports no history of alcohol use.  Drug use:  reports no history of drug use.    Family History:  Family History   Problem Relation Age of Onset   ??? Cancer Paternal Uncle         unknown, but has an ostomy   ??? Crohn's disease Paternal Aunt    ??? Cancer Maternal Uncle         colon   ??? Ulcerative colitis Neg Hx        Review of Systems:  10 systems reviewed and are negative unless otherwise mentioned in HPI    Objective:    Vital Signs/Weight:  Temp:  [36.6 ??C-37.4 ??C] 36.8 ??C  Heart Rate:  [84-94] 88  Resp:  [16-18] 16  BP: (114-133)/(61-67) 122/64  MAP (mmHg):  [83] 83  SpO2:  [96 %-98 %] 96 %  Wt Readings from Last 3 Encounters:   10/03/18 (!) 112.9 kg (248 lb 14.4 oz)   09/30/18 (!) 115.2 kg (253 lb 15.5 oz)   08/03/18 (!) 114.6 kg (252 lb 9.6 oz)       Physical Exam:  Constitutional: Pleasant young male, mask in place  HEENT: Anicteric sclera  Lung:  Unlabored breathing.   Abdomen: no tenderness to palpation, no rebound and no guarding  Extremities: No edema, well perfused  MSK: No joint swelling or tenderness noted, no deformities  Skin: No rashes, jaundice  Neuro: No focal deficits. No asterixis.   Mental Status: Alert and oriented to person place and time    Diagnostic Studies:  I reviewed all pertinent diagnostic studies, including:      Labs:    Recent Labs     10/03/18  1343 10/04/18  0632 10/05/18  0626   WBC 6.4 7.0 7.0   HGB 10.3* 9.4* 9.5*   HCT 30.4* 28.3* 29.0*   PLT 368 342 365     Recent Labs     10/03/18  1343 10/04/18  0632 10/05/18  0626   NA 141 140 141   K 3.4* 3.3* 3.4*   CL 107 104 106   BUN 4* 5* 5*   CREATININE 0.83 0.82 0.88   GLU 96 79 82     Recent Labs     10/03/18  1343 10/04/18  0632 10/05/18  0626   PROT 6.3* 5.8* 6.0*   ALBUMIN 3.4* 3.0* 3.0*   AST 15* 12* 12*   ALT 8 6 5    ALKPHOS 52 49 53   BILITOT 0.3 0.3 0.2       Recent Labs     10/03/18  1343 10/04/18  0632 10/05/18  0626   CRP 35.9* 42.8* 80.1*       Lab Results   Component Value Date    SMOOTHMUSCAB Negative 01/26/2017    IGG 1,338 01/26/2017    MITOAB Positive (A) 01/26/2017       Imaging:   EXAM: CT ABDOMEN PELVIS W CONTRAST  DATE: 10/03/2018 5:03 PM  ACCESSION: [id number]  DICTATED: 10/03/2018 5:32 PM  INTERPRETATION LOCATION: [facility]  ??  CLINICAL INDICATION: diffuse abd pain (mostly R sided), V/D with hx of UC and current  c diff infection ; Abdominal pain, acute, nonlocalized    ??  COMPARISON: CT abdomen and pelvis dated 06/17/2018  ??  TECHNIQUE: A spiral CT scan of the abdomen and pelvis was obtained with IV contrast from the lung bases through the pubic symphysis. Images were reconstructed in the axial plane. Coronal and sagittal reformatted images were also provided for further evaluation.  ??  FINDINGS:   LOWER THORAX: Dependent atelectasis bilaterally. Otherwise, the visualized lower chest is unremarkable.  ??  ABDOMEN/PELVIS:  HEPATOBILIARY: No focal hepatic lesions. The gallbladder is present and otherwise unremarkable. No biliary ductal dilatation.    SPLEEN: Unremarkable. PANCREAS: No focal mass or ductal dilatation. No peripancreatic fluid or adjacent inflammatory stranding.  ??  ADRENALS: Unremarkable.  KIDNEYS/URETERS: Symmetric nephrograms. No enhancing solid renal mass. Nonobstructing renal calculus within lower pole left kidney (2:48), measures 3 mm. No evidence of hydronephrosis.  ??  BLADDER: Mostly decompressed, limiting evaluation.  PELVIC/REPRODUCTIVE ORGANS: Unremarkable appearance of the prostate and seminal vesicles. No free fluid within the pelvis.  ??  GI TRACT:   No evidence of bowel obstruction. Loops of nondistended, fluid-filled, colon are noted throughout the abdomen with mild circumferential wall thickening extending from the level of the transverse colon to the rectum similar to prior possibly reflecting nonspecific colitis. Mild fatty infiltration of the rectum and sigmoid colon wall, unchanged. No adjacent inflammatory stranding or focal fluid collection is identified. The appendix is unremarkable.  ??  PERITONEUM/RETROPERITONEUM AND MESENTERY: No ascites. No pneumoperitoneum.  LYMPH NODES: Multiple prominent, but not pathologically enlarged mesenteric lymph nodes which may be reactive.   VESSELS: The aorta is normal in caliber.  No significant calcified atherosclerotic disease. The portal, splenic, and superior mesenteric veins are patent. IVC is unremarkable.  ??  BONES AND SOFT TISSUES: Bilateral gynecomastia. No soft tissue abnormality. No acute osseous abnormality. No worrisome osseous lesion.  ??  IMPRESSION:  - Loops of nondistended, fluid-filled, colon with mild circumferential wall thickening extending from level of the transverse colon to the rectum. These findings are nonspecific, however, could be seen in the setting of infectious or inflammatory colitis.  - Mild fatty infiltration of the sigmoid colon and rectal wall similar to prior compatible with chronic inflammatory changes from patient's reported history of ulcerative colitis. ====================  ADDENDUM (10/03/2018 5:48 PM): Dr.Altun dictating.  I agree with the report. There is increased wall thickening and enhancement of the rectosigmoid colon compared to the prior suggestive of inflammation. There is also likely mild inflammation of the descending colon which is overall stable to slightly improved compared to prior. These findings could be seen secondary to active inflammation of ulcerative colitis although superimposed infectious colitis cannot be excluded.  ??  Fluid-filled colon could also be associated with diarrheal state.    GI Procedures:   06/2018 flex sig    Findings:       The perianal and digital rectal examinations were normal.       Inflammation characterized by congestion (edema), erosions, erythema,        friability, loss of vascularity and mucus was found in a continuous and        circumferential pattern from the anus to the transverse colon (proximal        extent of exam). No sites were spared. This was severe and graded as        Mayo Score 3 (severe, with spontaneous bleeding, ulcerations), and when        compared to previous examinations, the findings  are worsened.       Retroflexion not performed due to severe degree of inflammation                                                                                   Impression:        - Ulcerative colitis. Inflammation was found from the anus                      to the transverse colon. This was severe and graded as                      Mayo Score 3 (severe disease), worsened compared to                      previous examinations.                     - No specimens collected.

## 2018-10-06 NOTE — Unmapped (Signed)
Sigmoidoscopy done today. Flat affect. Decreased activity. Pt only gets out of bed from bathroom.     Problem: Adult Inpatient Plan of Care  Goal: Plan of Care Review  Outcome: Ongoing - Unchanged  Goal: Patient-Specific Goal (Individualization)  Outcome: Ongoing - Unchanged  Goal: Absence of Hospital-Acquired Illness or Injury  Outcome: Ongoing - Unchanged  Goal: Optimal Comfort and Wellbeing  Outcome: Ongoing - Unchanged  Goal: Readiness for Transition of Care  Outcome: Ongoing - Unchanged  Goal: Rounds/Family Conference  Outcome: Ongoing - Unchanged

## 2018-10-06 NOTE — Unmapped (Signed)
GASTROENTEROLOGY TREATMENT PLAN NOTE     Requesting Attending Physician :  Santa Genera, MD    Reason for Consult:    Austin [name] is a 28 y.o. male seen in consultation at the request of Dr. Santa Genera, MD for colitis.    ASSESSMENT & RECOMMENDATIONS  Austin Kingis a 28 y.o.??male??with PMHx of UC on Xeljanz (Dr. Raphael Gibney), ADHD, seizure d/o, developmental delay, bipolar disorder and recent C diff infection presenting with current diarrhea.  He has a hx of ulcerative colitis since 2015, previously with lack of response to??mesalamine, prior exposure to Remicade (good response but??developed likely??DILI) and Vedolizumab (secondary loss of response). ??He had mod-severe colitis flare in the Fall of 2019 that did not respond to Vedolizumab re-load and prednisone. ??[name] and prednisone taper was started Feb 2020 with good response. He was recently admitted with diarrhea and found to have C diff +, with improvement after PO vanc initiation. He returned several days after discharge with 6 daily stools.  His symptoms were initially thought to be due to poor antibiotic compliance.  However, despite appropriate vanc dosing while inpatient, he has had a rising CRP from 42.8 on admission to 118.7 this morning.  He therefore underwent a flexible sigmoidoscopy and GI procedures today. Full report will be available in the procedures tab under chart review in Epic.   Briefly, noted to have severe (Mayo Score 3) colitis from rectum to the transverse colon.  Biopsies were taken for histology and viral PCR.  At this time, it appears that his severe colitis is the major driver of his symptoms and C. difficile is only playing a small role.  We have limited medical options for him and he will likely require surgical intervention.  Would recommend inpatient colorectal surgery consult to discuss options and initiate IV steroids.    - f/u path  - start solumedrol 20mg  IV q8h  - recommend colorectal surgery consult to discuss surgical options - continue Xeljanz 10 BID  - continue vancomycin therapy  - monitor CBC, CMP and CRP daily      Thank you for including [place] in the care of your patient. Patient discussed with Dr. Gustavus Messing and discussed with Dr. Raphael Gibney.  Please page the [facility] consult pager at [phone] with any questions.    Jackelyn Hoehn, MD  Gastroenterology and Hepatology Fellow, PGY-5  [employer] [facility] [employer] [place]    Portions of this record have been created using [employer]. Dictation errors have been sought, but may not have been identified and corrected.

## 2018-10-07 LAB — COMPREHENSIVE METABOLIC PANEL
ALBUMIN: 3.2 g/dL — ABNORMAL LOW (ref 3.5–5.0)
ALKALINE PHOSPHATASE: 57 U/L (ref 38–126)
ALT (SGPT): 6 U/L (ref <50)
ANION GAP: 13 mmol/L (ref 7–15)
AST (SGOT): 14 U/L — ABNORMAL LOW (ref 19–55)
AST (SGOT): 14 U/L — ABNORMAL LOW (ref 19–55)
BILIRUBIN TOTAL: <0.1 mg/dL (ref 0.0–1.2)
BUN / CREAT RATIO: 2
CALCIUM: 9.1 mg/dL (ref 8.5–10.2)
CHLORIDE: 107 mmol/L (ref 98–107)
CO2: 21 mmol/L — ABNORMAL LOW (ref 22.0–30.0)
CREATININE: 0.89 mg/dL (ref 0.70–1.30)
EGFR CKD-EPI AA MALE: >90 mL/min/{1.73_m2} (ref >=60)
GLUCOSE RANDOM: 107 mg/dL (ref 70–179)
POTASSIUM: 4 mmol/L (ref 3.5–5.0)
PROTEIN TOTAL: 6.6 g/dL (ref 6.5–8.3)
SODIUM: 141 mmol/L (ref 135–145)

## 2018-10-07 LAB — CBC
HEMATOCRIT: 29.3 % — ABNORMAL LOW (ref 41.0–53.0)
HEMOGLOBIN: 9.4 g/dL — ABNORMAL LOW (ref 13.5–17.5)
MEAN CORPUSCULAR VOLUME: 84.5 fL (ref 80.0–100.0)
MEAN PLATELET VOLUME: 8.2 fL (ref 7.0–10.0)
PLATELET COUNT: 453 10*9/L — ABNORMAL HIGH (ref 150–440)
RED BLOOD CELL COUNT: 3.47 10*12/L — ABNORMAL LOW (ref 4.50–5.90)
RED CELL DISTRIBUTION WIDTH: 15.7 % — ABNORMAL HIGH (ref 12.0–15.0)
WBC ADJUSTED: 6.8 10*9/L (ref 4.5–11.0)

## 2018-10-07 NOTE — Unmapped (Signed)
[facility] Consult Services  STOMA MARKING AND PRE-OP OSTOMY EDUCATION NOTE    Reason For Consult:  - Ileostomy  - Initial  - Pre-op Stoma Marking    Problem List:   Principal Problem:    Ulcerative colitis (CMS-HCC)  Active Problems:    Bipolar 1 disorder (CMS-HCC)    Asthma    ADHD (attention deficit hyperactivity disorder)    C. difficile diarrhea    Assessment: 28 y.o.??male??with a hx notable for severe??Ulcerative Colitis,??ADHD, Seizure disorder, Developmental delay, Bipolar disorder, hard of hearing and GERD recently admitted 4/22-4/24 for UC flair vs C-diff colitis now presenting again for persistent diarrhea and lower abdominal pain??c/f UC flare and infectious rectosigmoid colitis. Scheduled for TAC w/ileostomy and possible IPAA on 10/11/18. Pt reports he will D/C tomorrow and return to [facility] for procedure.     Type of Surgery:  - Ileostomy Date of Surgery:  10/11/18       The Stoma Marking Purpose And Procedure Was Explained:  Yes    The Patient/Family Member Verbalized Understanding And Agrees To The Marking: Yes    Teaching Limitations/Considerations:  - Anxiety    Pre-op OstomyTeaching Provided To:  - Patient Method Of Teaching:  - Ostomy teaching booklet  - Demonstration  - Verbal     Ostomy Teaching Points Reviewed:  - Type of ostomy  - Stoma characteristics  - Ostomy pouching and products  - Nutrition and hydration changes  - Activities of daily living with and ostomy  - Anticipated lifestyle changes  - Post-op ostomy teaching goals Patient???s Response To Ostomy Education:  - Verbalized understanding     Rectus Muscle Borders Are Located:  - No  - Anatomical landmarks used    Abdominal Contour Evaluation Was Performed In The Following Positions:  - Lying  - Sitting  - Standing    Abdominal Contours:  Round, obese, soft, with minor creasing extending from umbilicus at 3 and 6 o'clock    The Stoma Marking Was Made In The:  - RUQ (Right Upper Quadrant)  - LUQ (Left Upper Quadrant) Pre-op Mark Covered With Thin Transparent Film:  Yes      Plan:  - Patient instructed to maintain mark for OR.  - LIP (Licensed Independent Practitioner) to order WOCN:  New ostomy teaching consult for patient after surgery    Work Up Time:  45 minutes     Ann (Chantea Surace-Chia) Therapist, sports, BSN [facility]  [phone]  Phone- [phone]

## 2018-10-07 NOTE — Unmapped (Signed)
GASTROENTEROLOGY INPATIENT FOLLOW UP NOTE     Requesting Attending Physician:  Santa Genera, MD    Reason for Consult:  Austin [name] is a 28 y.o. male seen in consultation at the request of Dr. Santa Genera, MD for colitis.    ASSESSMENT:  Austin Kingis a 28 y.o.??male??with PMHx of UC on Xeljanz (Dr. Raphael Gibney), ADHD, seizure d/o, developmental delay, bipolar disorder and recent C diff infection presenting with current diarrhea.  Flexible sigmoidoscopy on 4/29 which revealed severe (Mayo Score 3) colitis from rectum to the transverse colon.  Biopsies were taken for histology and viral PCR.  At this time, it appears that his severe colitis is the major driver of his symptoms and C. difficile is only playing a small role.  We have limited medical options for him and he will likely require surgical intervention.      Patient was seen by lower GI surgery team today who plans on taking to the OR on 10/11/2018 for total abdominal proctocolectomy with ileostomy and possible IPAA.  Given that the patient has a very benign abdominal exam and reports feeling well, the risks of continuing IV steroids in the preoperative setting likely outweigh the benefits.  Would recommend discontinuing steroids at this time, and continue to monitor CRP daily.  ??  - f/u path  - discontinue steroids  - continue Xeljanz 10 BID  - continue vancomycin therapy  - monitor CBC, CMP and CRP daily      Patient was seen and discussed with Gustavus Messing and discussed with Dr. Raphael Gibney. Recommendations were discussed with the primary team. Please page [phone] for further questions/concerns.    Jackelyn Hoehn, MD  Gastroenterology and Hepatology Fellow, PGY-5  [facility], [place]        Interval History:  NAEO.  Patient reports two bowel movements since yesterday evening.  CRP notably improved with initiation of steroids    Medications:  Current Facility-Administered Medications   Medication Dose Route Frequency Provider Last Rate Last Dose   ??? **PATIENT SUPPLY**tofacitinib Tab 10 mg  10 mg Oral BID Tonye Royalty, MD   10 mg at 10/07/18 0836   ??? acetaminophen (TYLENOL) tablet 1,000 mg  1,000 mg Oral Q8H Rafat Mahmood, MD   1,000 mg at 10/07/18 1438   ??? cholecalciferol (vitamin D3) tablet 1,000 Units  1,000 Units Oral Daily Tamera Reason, MD   1,000 Units at 10/07/18 0835   ??? enoxaparin (LOVENOX) syringe 40 mg  40 mg Subcutaneous Q24H [facility] Tonye Royalty, MD   40 mg at 10/06/18 2011   ??? famotidine (PEPCID) tablet 20 mg  20 mg Oral BID Rafat Mahmood, MD   20 mg at 10/07/18 9323   ??? lactated Ringers infusion  10 mL/hr Intravenous Continuous Maris Berger, MD 10 mL/hr at 10/06/18 1509 10 mL/hr at 10/06/18 1509   ??? loratadine (CLARITIN) tablet 10 mg  10 mg Oral Daily Rafat Mahmood, MD   10 mg at 10/07/18 0835   ??? methylPREDNISolone sodium succinate (PF) (Solu-MEDROL) injection 20 mg  20 mg Intravenous [facility] Tonye Royalty, MD   20 mg at 10/07/18 1437   ??? montelukast (SINGULAIR) tablet 10 mg  10 mg Oral Nightly Rafat Mahmood, MD   10 mg at 10/06/18 2011   ??? ondansetron (ZOFRAN) injection 4 mg  4 mg Intravenous Q6H PRN Tonye Royalty, MD   4 mg at 10/04/18 1158   ??? propranoloL (INDERAL) tablet 10 mg  10 mg Oral TID Tamera Reason, MD  10 mg at 10/07/18 1436   ??? risperiDONE (RisperDAL) tablet 1 mg  1 mg Oral Daily Tonye Royalty, MD   1 mg at 10/07/18 0835   ??? sertraline (ZOLOFT) tablet 75 mg  75 mg Oral Daily Rafat Mahmood, MD   75 mg at 10/07/18 1308   ??? sulfamethoxazole-trimethoprim (BACTRIM) 400-80 mg tablet 80 mg of trimethoprim  1 tablet Oral Daily Rafat Mahmood, MD   80 mg of trimethoprim at 10/07/18 0835   ??? traZODone (DESYREL) tablet 50 mg  50 mg Oral Nightly Rafat Mahmood, MD   50 mg at 10/06/18 2010   ??? vancomycin [facility]) oral solution  125 mg Oral 4x Daily Tonye Royalty, MD   125 mg at 10/07/18 1200       Vital Signs:  Temp:  [36.2 ??C-36.8 ??C] 36.3 ??C  Heart Rate:  [68-85] 85  SpO2 Pulse:  [72-84] 78  Resp:  [13-20] 18  BP: (85-144)/(58-74) 130/64  SpO2:  [98 %-100 %] 100 %    Intake/Output last 3 shifts:  I/O last 3 completed shifts:  In: 700 [I.V.:700]  Out: -     Physical Exam:  Constitutional: Pleasant young male , sleepy  HEENT: Anicteric sclera  CV: Regular rate and rhythm  Lung: Clear to auscultation bilaterally. Unlabored breathing.   Abdomen: mild right sided abdominal tenderness, no rebound or guarding, + BS  Extremities: No edema, well perfused  MSK: No joint swelling or tenderness noted, no deformities  Skin: No rashes, jaundice  Neuro: No focal deficits. No asterixis.   Mental Status: Alert and oriented to person place and time    Diagnostic Studies:   Labs:  Recent Labs     10/05/18  0626 10/06/18  0602 10/07/18  0620   WBC 7.0 6.6 6.8   HGB 9.5* 10.0* 9.4*   HCT 29.0* 30.6* 29.3*   PLT 365 391 453*     Recent Labs     10/05/18  0626 10/06/18  0602 10/07/18  0621   NA 141 140 141   K 3.4* 3.4* 4.0   CL 106 106 107   BUN 5* 3* 2*   CREATININE 0.88 0.83 0.89   GLU 82 84 107     Recent Labs     10/05/18  0626 10/06/18  0602 10/07/18  0621   PROT 6.0* 6.0* 6.6   ALBUMIN 3.0* 3.0* 3.2*   AST 12* 13* 14*   ALT 5 5 6    ALKPHOS 53 60 57   BILITOT 0.2 <0.1 <0.1     No results for input(s): INR, APTT, FIBRINOGEN in the last 72 hours.    Imaging:  Reviewed    GI:  Flex Sig 4/29  Findings:       The perianal and digital rectal examinations were normal.       Inflammation was found in a continuous and circumferential pattern from        the rectum to the transverse colon (extent of the exam). This was graded        as Mayo Score 3 (severe, with spontaneous bleeding, ulcerations), and        when compared to the previous examination, appears unchanged per        description. Mucosal changes included erythema, granularity, friability,        spontaneous bleeding, apthous ulcerations, and loss of vascular pattern.        Biopsies were taken with a cold forceps for histology and viral PCR        (  CMV, HSV, adenovirus).

## 2018-10-07 NOTE — Unmapped (Signed)
A&O x 4. VSS. Denies any pain. Slept off and on through the night. Pt up independent to restroom. Continues to have diarrhea stools.     Problem: Adult Inpatient Plan of Care  Goal: Plan of Care Review  Outcome: Progressing  Goal: Patient-Specific Goal (Individualization)  Outcome: Progressing  Goal: Absence of Hospital-Acquired Illness or Injury  Outcome: Progressing  Goal: Optimal Comfort and Wellbeing  Outcome: Progressing  Goal: Readiness for Transition of Care  Outcome: Progressing  Goal: Rounds/Family Conference  Outcome: Progressing     Problem: Infection  Goal: Infection Symptom Resolution  Outcome: Progressing     Problem: Pain Acute  Goal: Optimal Pain Control  Outcome: Progressing

## 2018-10-07 NOTE — Unmapped (Signed)
Lower GI Surgery Consult Note    Requesting Attending Physician:  Santa Genera, MD  Service Requesting Consult:  [facility] (MDW)  Service Providing Consult: Lower GI Surgery  Consulting Attending: Rae Halsted, MD     Assessment:  28 yo M with obesity (BMI 37) and medically refractory ulcerative colitis; most recently with flexible sigmoidoscopy demonstrating severe colitis from rectum to transverse colon. He remains hemodynamically stable and is undergoing treatment with IV Solumedrol.    Recommendations:  -OR 10/11/18 for total abdominal proctocolectomy with ileostomy, possible ileal-pouch with anal anastomosis (more likely second stage), especially in the presence of systemic steroids   -Consent obtained from the patient's mother Cala Bradford), specifically including infection, bleeding, damage to intra-abdominal structures, the likelihood for an ileostomy and the need for re-operation. All questions were answered  -WOCN consult for pre-operative marking (order placed)   -Please ensure that the patient is NPO @ MN prior to the day of operation     The patient's plan was discussed attending surgeon, Dr. Epifania Gore. If there are any questions, concerns or changes in the patient's clinical status, please feel free to contact the Lower GI Surgery Consult Pager.     History of Present Illness:   This is a 28 year old male with history of ulcerative colitis which was initially diagnosed in 2015. His initial symptoms included abdominal pain and bloody diarrhea. He reports that he has undergone multiple treatments which have been innefective. He reports approximately 2-3 flares per year which consist of > 4 blood diarrhea-like bowel movements daily. He reports that a normal for him would be 2-3 bowel movements of semi-solid stool daily. The treatments he has undergone include Mesalamine, Budesonide, Remicade (c/b C. Diff colitis and DILI in 2017), various Prednisone regimens, Vedolizumab (with lack of response in 2019) and most recently, [name] (Tofalizumab) in 07/2018 with a good response. This however was c/b C. Diff colitis and he most recently underwent a flexible sigmoidoscopy on 10/01/18 which revealed severe colitis from the rectum to the transverse colon. This is the same distribution seen on his previous scopes, including his last colonoscopy in 2014 (in which the TI was seen and normal).     He was last seen by Dr. Elwin Mocha in 2018 for surgical planning. At that time it was decided that he would undergo a scope and pursue further medical treatments. However, at this point GI does not believe that they have any further options. In addition, him and his mother are now on board with surgical intervention.     He has been able to tolerate a regular diet recently. He denies fever, chills or night sweats.     Past Medical History:  Past Medical History:   Diagnosis Date   ??? ADHD    ??? Asthma    ??? Bipolar 1 disorder (CMS-HCC)    ??? Depression    ??? GERD (gastroesophageal reflux disease)    ??? HOH (hard of hearing)    ??? Hypertension    ??? Mental developmental delay    ??? Seizures (CMS-HCC)     last seizures 2016   ??? Ulcerative colitis (CMS-HCC)      Past Surgical History:  Past Surgical History:   Procedure Laterality Date   ??? ADENOIDECTOMY     ??? PR COLONOSCOPY FLX DX W/COLLJ SPEC WHEN PFRMD N/A 08/19/2017    Procedure: COLONOSCOPY, FLEXIBLE, PROXIMAL TO SPLENIC FLEXURE; DIAGNOSTIC, W/WO COLLECTION SPECIMEN BY BRUSH OR WASH;  Surgeon: Bluford Kaufmann, MD;  [facility]: [facility];  Service: Gastroenterology   ??? PR COLONOSCOPY W/BIOPSY SINGLE/MULTIPLE N/A 02/12/2017    Procedure: COLONOSCOPY, FLEXIBLE, PROXIMAL TO SPLENIC FLEXURE; WITH BIOPSY, SINGLE OR MULTIPLE;  Surgeon: Rona Ravens, MD;  [facility];  Service: Gastroenterology   ??? PR SIGMOIDOSCOPY FLX DX W/COLLJ SPEC BR/WA IF PFRMD N/A 06/21/2018    Procedure: SIGMOIDOSCOPY, FLEXIBLE; DIAGNOSTIC, WITH OR WITHOUT COLLECTION OF SPECIMEN(S) BY BRUSHING OR WASHING;  Surgeon: Cletis Athens, MD;  Location: [facility];  Service: Gastroenterology     Medication/Allergies:  Current Facility-Administered Medications   Medication Dose Route Frequency Provider Last Rate Last Dose   ??? **PATIENT SUPPLY**tofacitinib Tab 10 mg  10 mg Oral BID Tonye Royalty, MD   10 mg at 10/07/18 0836   ??? acetaminophen (TYLENOL) tablet 1,000 mg  1,000 mg Oral Q8H Rafat Mahmood, MD   1,000 mg at 10/07/18 0549   ??? cholecalciferol (vitamin D3) tablet 1,000 Units  1,000 Units Oral Daily Tamera Reason, MD   1,000 Units at 10/07/18 0835   ??? enoxaparin (LOVENOX) syringe 40 mg  40 mg Subcutaneous Q24H [facility] Tonye Royalty, MD   40 mg at 10/06/18 2011   ??? famotidine (PEPCID) tablet 20 mg  20 mg Oral BID Rafat Mahmood, MD   20 mg at 10/07/18 4540   ??? lactated Ringers infusion  10 mL/hr Intravenous Continuous Maris Berger, MD 10 mL/hr at 10/06/18 1509 10 mL/hr at 10/06/18 1509   ??? loratadine (CLARITIN) tablet 10 mg  10 mg Oral Daily Rafat Mahmood, MD   10 mg at 10/07/18 0835   ??? methylPREDNISolone sodium succinate (PF) (Solu-MEDROL) injection 20 mg  20 mg Intravenous [facility] Tonye Royalty, MD   20 mg at 10/07/18 0550   ??? montelukast (SINGULAIR) tablet 10 mg  10 mg Oral Nightly Rafat Mahmood, MD   10 mg at 10/06/18 2011   ??? ondansetron (ZOFRAN) injection 4 mg  4 mg Intravenous Q6H PRN Tonye Royalty, MD   4 mg at 10/04/18 1158   ??? propranoloL (INDERAL) tablet 10 mg  10 mg Oral TID Tamera Reason, MD   10 mg at 10/07/18 0835   ??? risperiDONE (RisperDAL) tablet 1 mg  1 mg Oral Daily Tonye Royalty, MD   1 mg at 10/07/18 0835   ??? sertraline (ZOLOFT) tablet 75 mg  75 mg Oral Daily Rafat Mahmood, MD   75 mg at 10/07/18 9811   ??? sulfamethoxazole-trimethoprim (BACTRIM) 400-80 mg tablet 80 mg of trimethoprim  1 tablet Oral Daily Rafat Mahmood, MD   80 mg of trimethoprim at 10/07/18 0835   ??? traZODone (DESYREL) tablet 50 mg  50 mg Oral Nightly Rafat Mahmood, MD   50 mg at 10/06/18 2010   ??? vancomycin [facility]) oral solution  125 mg Oral 4x Daily Tonye Royalty, MD   125 mg at 10/07/18 0549     Allergies   Allergen Reactions   ??? Infliximab Other (See Comments)     AUTOIMMUNE HEPATITIS     ??? Nsaids (Non-Steroidal Anti-Inflammatory Drug) Other (See Comments)     Should not take 2/2 GI condition  Other reaction(s): Other (See Comments)  Should not take due to GI condition   ??? Pineapple Swelling     Reaction:  Lip swelling   ??? Strawberry Swelling     Reaction:  Lip swelling   ??? Tolmetin Other (See Comments)     Should not take due to GI condition     ???  Tomato Swelling and Rash   ??? Amoxicillin-Pot Clavulanate Nausea And Vomiting and Nausea Only     GI intolerance   ??? Omeprazole Nausea And Vomiting     Social History:  Social History     Tobacco Use   ??? Smoking status: Never Smoker   ??? Smokeless tobacco: Never Used   Substance Use Topics   ??? Alcohol use: No   ??? Drug use: No     Family History:  Family History   Problem Relation Age of Onset   ??? Cancer Paternal Uncle         unknown, but has an ostomy   ??? Crohn's disease Paternal Aunt    ??? Cancer Maternal Uncle         colon   ??? Ulcerative colitis Neg Hx      Review of Systems  10 systems were reviewed and are negative except as noted specifically in the HPI.    Objective:   BP 132/68  - Pulse 70  - Temp 36.2 ??C (Oral)  - Resp 19  - Ht 174 cm (5' 8.5)  - Wt (!) 112.9 kg (248 lb 14.4 oz)  - SpO2 98%  - BMI 37.29 kg/m??     Intake/Output last 3 shifts:  I/O last 3 completed shifts:  In: 700 [I.V.:700]  Out: -     Physical Exam  General Appearance:  Alert and oriented, no acute distress    HEENT:  NCAT, PERRL, no scleral icterus    Pulmonary:    Non labored respirations    Cardiovascular:  RR   Abdomen:   Soft, NT, minimally distended    Extremities: Warm, well-perfused   Skin: No rashes      Most Recent Labs:  Lab Results   Component Value Date    WBC 6.8 10/07/2018    HGB 9.4 (L) 10/07/2018    HCT 29.3 (L) 10/07/2018    PLT 453 (H) 10/07/2018       Lab Results   Component Value Date    NA 141 10/07/2018    K 4.0 10/07/2018    CL 107 10/07/2018    CO2 21.0 (L) 10/07/2018    BUN 2 (L) 10/07/2018    CREATININE 0.89 10/07/2018    CALCIUM 9.1 10/07/2018    MG 1.8 10/07/2018    PHOS 3.7 10/07/2018     IMAGING:  Ct Abdomen Pelvis With Iv Contrast Only    Result Date: 10/03/2018  EXAM: CT ABDOMEN PELVIS W CONTRAST DATE: 10/03/2018 5:03 PM ACCESSION: [id number] UN DICTATED: 10/03/2018 5:32 PM INTERPRETATION LOCATION: [facility] CLINICAL INDICATION: diffuse abd pain (mostly R sided), V/D with hx of UC and current c diff infection ; Abdominal pain, acute, nonlocalized  COMPARISON: CT abdomen and pelvis dated 06/17/2018 TECHNIQUE: A spiral CT scan of the abdomen and pelvis was obtained with IV contrast from the lung bases through the pubic symphysis. Images were reconstructed in the axial plane. Coronal and sagittal reformatted images were also provided for further evaluation. FINDINGS: LOWER THORAX: Dependent atelectasis bilaterally. Otherwise, the visualized lower chest is unremarkable. ABDOMEN/PELVIS: HEPATOBILIARY: No focal hepatic lesions. The gallbladder is present and otherwise unremarkable. No biliary ductal dilatation.  SPLEEN: Unremarkable. PANCREAS: No focal mass or ductal dilatation. No peripancreatic fluid or adjacent inflammatory stranding. ADRENALS: Unremarkable. KIDNEYS/URETERS: Symmetric nephrograms. No enhancing solid renal mass. Nonobstructing renal calculus within lower pole left kidney (2:48), measures 3 mm. No evidence of hydronephrosis. BLADDER: Mostly decompressed, limiting evaluation. PELVIC/REPRODUCTIVE ORGANS: Unremarkable appearance of  the prostate and seminal vesicles. No free fluid within the pelvis. GI TRACT: No evidence of bowel obstruction. Loops of nondistended, fluid-filled, colon are noted throughout the abdomen with mild circumferential wall thickening extending from the level of the transverse colon to the rectum similar to prior possibly reflecting nonspecific colitis. Mild fatty infiltration of the rectum and sigmoid colon wall, unchanged. No adjacent inflammatory stranding or focal fluid collection is identified. The appendix is unremarkable. PERITONEUM/RETROPERITONEUM AND MESENTERY: No ascites. No pneumoperitoneum. LYMPH NODES: Multiple prominent, but not pathologically enlarged mesenteric lymph nodes which may be reactive. VESSELS: The aorta is normal in caliber.  No significant calcified atherosclerotic disease. The portal, splenic, and superior mesenteric veins are patent. IVC is unremarkable. BONES AND SOFT TISSUES: Bilateral gynecomastia. No soft tissue abnormality. No acute osseous abnormality. No worrisome osseous lesion.     - Loops of nondistended, fluid-filled, colon with mild circumferential wall thickening extending from level of the transverse colon to the rectum. These findings are nonspecific, however, could be seen in the setting of infectious or inflammatory colitis. - Mild fatty infiltration of the sigmoid colon and rectal wall similar to prior compatible with chronic inflammatory changes from patient's reported history of ulcerative colitis. ==================== ADDENDUM (10/03/2018 5:48 PM): Dr.Altun dictating. I agree with the report. There is increased wall thickening and enhancement of the rectosigmoid colon compared to the prior suggestive of inflammation. There is also likely mild inflammation of the descending colon which is overall stable to slightly improved compared to prior. These findings could be seen secondary to active inflammation of ulcerative colitis although superimposed infectious colitis cannot be excluded. Fluid-filled colon could also be associated with diarrheal state.  -------------------------------  Dillon Bjork, MD   [facility]   P: [phone]

## 2018-10-07 NOTE — Unmapped (Signed)
Recreational Therapy referral received 10/06/18, however, we will not be able to address the consult order as Recreational Therapy treatment is not available to this service at this time. RT spoke with pt's RN to inform of content above. You may find [facility] can assist you with support for patient needs and can be contacted by calling [phone] or through email at [email or web address] If you contact them, please include patient name, room number, gender, age and any interests or specific needs. Thank you.    Duane Lope, LRT/CTRS

## 2018-10-07 NOTE — Unmapped (Signed)
Daily Progress Note    Assessment/Plan:    Principal Problem:    Ulcerative colitis (CMS-HCC)  Active Problems:    Bipolar 1 disorder (CMS-HCC)    Asthma    ADHD (attention deficit hyperactivity disorder)    C. difficile diarrhea       LOS: 3 days     28 y.o.??male??with a hx notable for severe Ulcerative Colitis, ADHD, Seizure disorder, Developmental delay, Bipolar disorder, hard of hearing and GERD recently admitted 4/22-4/24 for UC flair vs C-diff colitis now presenting again for persistent diarrhea and lower abdominal pain c/f UC flare and infectious rectosigmoid colitis.   ??  Rectosigmoid Colitis - UC Flare - C-diff colitis: Ongoing bloody diarrhea up to 10x daily per patient's mother. CT abdomen with slightly worsened inflammatory changes in rectosigmoid area compared to recent CT 5 days prior. Has been compliant with PO vancomycin at home, which has been continued here, but continues to have frequent watery bloody diarrhea, and CRP is rising. Flex sigmoidoscopy completed 4/29, showed severe colitis to transcolon.   - Start 20 mg IV solumedrol q8h.   - Tylenol 1000mg  TID, avoid opiods  - Replete K/Mg  - Zofran prn for n/v  - Continue home Xeljanz  - c/w bactrim Ppx  - Consult colorectal surgery in AM  ??  Mood disorder??/ Bipolar Disorder: C/w risperidone (in replacement of non formulary paliperidone), sertraline, trazodone.  ??  Asthma: Continue home PRN albuterol, PO claritin, and PO montelukast   ??  DVT PPx: lovenox  Full Code  NPO at [facility]     Subjective:     Had 7 bowel movements yesterday, none overnight. One watery bloody bowel movement this morning. CRP rising. Abdominal pain is improved.     Objective:     Vital signs in last 24 hours:  Temp:  [35.9 ??C-36.6 ??C] 36.2 ??C  Heart Rate:  [66-90] 71  SpO2 Pulse:  [72-84] 78  Resp:  [13-20] 16  BP: (85-131)/(55-73) 128/66  SpO2:  [97 %-100 %] 100 %    Intake/Output last 3 shifts:  I/O last 3 completed shifts:  In: 700 [I.V.:700]  Out: -     Physical Exam:  General appearance: alert and no acute distress  Abdomen: soft, minimally tender in lower quadrants   Extremities: extremities normal, atraumatic, no cyanosis or edema

## 2018-10-07 NOTE — Unmapped (Signed)
VSS. GI surgery planned 5/4. WOC RN coming to [facility] this evening to mark for surgery. Up in room with steady gait. Up to chair for lunch. NO falls or injuries. Monitoring     Problem: Adult Inpatient Plan of Care  Goal: Plan of Care Review  10/07/2018 1509 by Silvano Bilis, RN  Outcome: Progressing  10/07/2018 1509 by Silvano Bilis, RN  Outcome: Progressing  Goal: Patient-Specific Goal (Individualization)  10/07/2018 1509 by Silvano Bilis, RN  Outcome: Progressing  10/07/2018 1509 by Silvano Bilis, RN  Outcome: Progressing  Goal: Absence of Hospital-Acquired Illness or Injury  10/07/2018 1509 by Silvano Bilis, RN  Outcome: Progressing  10/07/2018 1509 by Silvano Bilis, RN  Outcome: Progressing  Goal: Optimal Comfort and Wellbeing  10/07/2018 1509 by Silvano Bilis, RN  Outcome: Progressing  10/07/2018 1509 by Silvano Bilis, RN  Outcome: Progressing  Goal: Readiness for Transition of Care  10/07/2018 1509 by Silvano Bilis, RN  Outcome: Progressing  10/07/2018 1509 by Silvano Bilis, RN  Outcome: Progressing  Goal: Rounds/Family Conference  10/07/2018 1509 by Silvano Bilis, RN  Outcome: Progressing  10/07/2018 1509 by Silvano Bilis, RN  Outcome: Progressing     Problem: Infection  Goal: Infection Symptom Resolution  10/07/2018 1509 by Silvano Bilis, RN  Outcome: Progressing  10/07/2018 1509 by Silvano Bilis, RN  Outcome: Progressing     Problem: Pain Acute  Goal: Optimal Pain Control  10/07/2018 1509 by Silvano Bilis, RN  Outcome: Progressing  10/07/2018 1509 by Silvano Bilis, RN  Outcome: Progressing

## 2018-10-08 DIAGNOSIS — R112 Nausea with vomiting, unspecified: Principal | ICD-10-CM

## 2018-10-08 LAB — CBC
HEMATOCRIT: 28.7 % — ABNORMAL LOW (ref 41.0–53.0)
HEMOGLOBIN: 9.4 g/dL — ABNORMAL LOW (ref 13.5–17.5)
MEAN CORPUSCULAR HEMOGLOBIN CONC: 32.7 g/dL (ref 31.0–37.0)
MEAN CORPUSCULAR HEMOGLOBIN: 27.6 pg (ref 26.0–34.0)
MEAN PLATELET VOLUME: 8.4 fL (ref 7.0–10.0)
PLATELET COUNT: 420 10*9/L (ref 150–440)
RED BLOOD CELL COUNT: 3.4 10*12/L — ABNORMAL LOW (ref 4.50–5.90)
RED CELL DISTRIBUTION WIDTH: 16 % — ABNORMAL HIGH (ref 12.0–15.0)
WBC ADJUSTED: 7.9 10*9/L (ref 4.5–11.0)

## 2018-10-08 LAB — BASIC METABOLIC PANEL
ANION GAP: 10 mmol/L (ref 7–15)
BLOOD UREA NITROGEN: 3 mg/dL — ABNORMAL LOW (ref 7–21)
BUN / CREAT RATIO: 4
CALCIUM: 8.7 mg/dL (ref 8.5–10.2)
CHLORIDE: 110 mmol/L — ABNORMAL HIGH (ref 98–107)
CO2: 21 mmol/L — ABNORMAL LOW (ref 22.0–30.0)
EGFR CKD-EPI AA MALE: >90 mL/min/{1.73_m2} (ref >=60)
EGFR CKD-EPI NON-AA MALE: >90 mL/min/{1.73_m2} (ref >=60)
GLUCOSE RANDOM: 110 mg/dL (ref 70–179)
SODIUM: 141 mmol/L (ref 135–145)

## 2018-10-08 LAB — EGFR CKD-EPI AA MALE: Lab: >90

## 2018-10-08 LAB — MAGNESIUM: Magnesium:MCnc:Pt:Ser/Plas:Qn:: 1.6

## 2018-10-08 LAB — C-REACTIVE PROTEIN: C reactive protein:MCnc:Pt:Ser/Plas:Qn:: 24.4 — ABNORMAL HIGH

## 2018-10-08 LAB — RED BLOOD CELL COUNT: Lab: 3.4 — ABNORMAL LOW

## 2018-10-08 LAB — PHOSPHORUS: Phosphate:MCnc:Pt:Ser/Plas:Qn:: 3.4

## 2018-10-08 NOTE — Unmapped (Signed)
Pt A&Ox4, VSS, no complaints during shift. Plan is to keep him until surgery on Monday. His mother will be part of the consent discussion as he has some intellectual deficits. WCTM.    Problem: Adult Inpatient Plan of Care  Goal: Plan of Care Review  Outcome: Ongoing - Unchanged  Goal: Patient-Specific Goal (Individualization)  Outcome: Ongoing - Unchanged  Goal: Absence of Hospital-Acquired Illness or Injury  Outcome: Ongoing - Unchanged  Goal: Optimal Comfort and Wellbeing  Outcome: Ongoing - Unchanged  Goal: Readiness for Transition of Care  Outcome: Ongoing - Unchanged  Goal: Rounds/Family Conference  Outcome: Ongoing - Unchanged     Problem: Infection  Goal: Infection Symptom Resolution  Outcome: Ongoing - Unchanged     Problem: Pain Acute  Goal: Optimal Pain Control  Outcome: Ongoing - Unchanged

## 2018-10-08 NOTE — Unmapped (Signed)
Daily Progress Note    Assessment/Plan:    Principal Problem:    Ulcerative colitis (CMS-HCC)  Active Problems:    Bipolar 1 disorder (CMS-HCC)    Asthma    ADHD (attention deficit hyperactivity disorder)    C. difficile diarrhea       LOS: 4 days     28 y.o.??male??with a hx notable for severe Ulcerative Colitis, ADHD, Seizure disorder, Developmental delay, Bipolar disorder, hard of hearing and GERD recently admitted 4/22-4/24 for UC flair vs C-diff colitis now presenting again for persistent diarrhea and lower abdominal pain c/f UC flare and infectious rectosigmoid colitis.   ??  Rectosigmoid Colitis - UC Flare - C-diff colitis: Ongoing bloody diarrhea up to 10x daily per patient's mother. CT abdomen with slightly worsened inflammatory changes in rectosigmoid area compared to recent CT 5 days prior. CRP rising. Flex sigmoidoscopy completed 4/29, showed severe colitis to transverse colon. Plan for colectomy on Monday.   - Stop steroids in anticipation for surgery   - Continue PO Vancomycin for 14 day course  - Tylenol 1000mg  TID, avoid opiods   - Zofran prn for n/v  - Continue home Xeljanz  - c/w bactrim Ppx  - Colorectal surgery and GI following   - Daily CRP   - F/u pathology from scope   ??  Mood disorder??/ Bipolar Disorder: C/w risperidone (in replacement of non formulary paliperidone), sertraline, trazodone.  ??  Asthma: Continue home PRN albuterol, PO claritin, and PO montelukast   ??  DVT PPx: lovenox  Full Code  Regular diet     Subjective:     Overnight, 2 BM's. Today, with 3 BM's with blood.     Objective:     Vital signs in last 24 hours:  Temp:  [36.2 ??C-36.8 ??C] 36.3 ??C  Heart Rate:  [68-85] 79  Resp:  [16-19] 16  BP: (111-144)/(7-74) 134/7  SpO2:  [95 %-100 %] 95 %    Intake/Output last 3 shifts:  I/O last 3 completed shifts:  In: 700 [I.V.:700]  Out: -     Physical Exam:  General appearance: alert and no acute distress  Abdomen: soft, non tender abdomen today   Extremities: extremities normal, atraumatic, no cyanosis or edema

## 2018-10-08 NOTE — Unmapped (Addendum)
A&O x 4. VSS. Denies any pain. No new complaints. Up to restroom independently. Slept off and on throughout the night. Enteric precautions in place.     Problem: Adult Inpatient Plan of Care  Goal: Plan of Care Review  Outcome: Progressing  Goal: Patient-Specific Goal (Individualization)  Outcome: Progressing  Goal: Absence of Hospital-Acquired Illness or Injury  Outcome: Progressing  Goal: Optimal Comfort and Wellbeing  Outcome: Progressing  Goal: Readiness for Transition of Care  Outcome: Progressing  Goal: Rounds/Family Conference  Outcome: Progressing     Problem: Infection  Goal: Infection Symptom Resolution  Outcome: Progressing     Problem: Pain Acute  Goal: Optimal Pain Control  Outcome: Progressing

## 2018-10-08 NOTE — Unmapped (Signed)
[name] is currently admitted to the hospital being followed by inpatient Med W, GI medicine and GI surgery teams. I have been in touch with GI team about plan of care.      Austin [name] is well known to me from outpatient IBD clinic.  I spoke with patient's mom in detail reviewing plan of care.  I explained that he has medically refractory disease and I am doubtful that additional medical therapies will be effective (non-response after recent adequate challenges with Vedolizumab and now 3 months of full dose [name]).      I reviewed plans for colectomy, temporary ostomy and staged IPAA in future.  I did also review some of the pros and cons of surgery at this time as well as realistic expectations for pouch function after IPAA.  She did not have any questions at this time about the plan of care and I provided her my personal ph# if questions arise.  She did have questions regarding ostomy teaching.  I will contact inpatient team to discuss this.

## 2018-10-09 LAB — BASIC METABOLIC PANEL
BLOOD UREA NITROGEN: 2 mg/dL — ABNORMAL LOW (ref 7–21)
BUN / CREAT RATIO: 2
CHLORIDE: 111 mmol/L — ABNORMAL HIGH (ref 98–107)
CO2: 23 mmol/L (ref 22.0–30.0)
CREATININE: 0.99 mg/dL (ref 0.70–1.30)
EGFR CKD-EPI AA MALE: >90 mL/min/{1.73_m2} (ref >=60)
EGFR CKD-EPI NON-AA MALE: >90 mL/min/{1.73_m2} (ref >=60)
GLUCOSE RANDOM: 93 mg/dL (ref 70–179)
POTASSIUM: 3.5 mmol/L (ref 3.5–5.0)
SODIUM: 140 mmol/L (ref 135–145)

## 2018-10-09 LAB — CBC
HEMATOCRIT: 29.9 % — ABNORMAL LOW (ref 41.0–53.0)
HEMOGLOBIN: 9.6 g/dL — ABNORMAL LOW (ref 13.5–17.5)
MEAN CORPUSCULAR HEMOGLOBIN: 27.5 pg (ref 26.0–34.0)
MEAN CORPUSCULAR VOLUME: 85.4 fL (ref 80.0–100.0)
MEAN PLATELET VOLUME: 7.6 fL (ref 7.0–10.0)
PLATELET COUNT: 417 10*9/L (ref 150–440)
RED BLOOD CELL COUNT: 3.5 10*12/L — ABNORMAL LOW (ref 4.50–5.90)
RED CELL DISTRIBUTION WIDTH: 15.8 % — ABNORMAL HIGH (ref 12.0–15.0)

## 2018-10-09 LAB — CHLORIDE: Chloride:SCnc:Pt:Ser/Plas:Qn:: 111 — ABNORMAL HIGH

## 2018-10-09 LAB — C-REACTIVE PROTEIN: C reactive protein:MCnc:Pt:Ser/Plas:Qn:: 15.4 — ABNORMAL HIGH

## 2018-10-09 LAB — RED BLOOD CELL COUNT: Lab: 3.5 — ABNORMAL LOW

## 2018-10-09 LAB — MAGNESIUM: Magnesium:MCnc:Pt:Ser/Plas:Qn:: 1.5 — ABNORMAL LOW

## 2018-10-09 LAB — PHOSPHORUS: Phosphate:MCnc:Pt:Ser/Plas:Qn:: 4.9 — ABNORMAL HIGH

## 2018-10-09 NOTE — Unmapped (Signed)
Daily Progress Note    Assessment/Plan:    Principal Problem:    Ulcerative colitis (CMS-HCC)  Active Problems:    Bipolar 1 disorder (CMS-HCC)    Asthma    ADHD (attention deficit hyperactivity disorder)    C. difficile diarrhea       LOS: 5 days     28 y.o.??male??with a hx notable for severe Ulcerative Colitis, ADHD, Seizure disorder, Developmental delay, Bipolar disorder, hard of hearing and GERD recently admitted 4/22-4/24 for UC flair vs C-diff colitis now presenting again for persistent diarrhea and lower abdominal pain c/f UC flare and infectious rectosigmoid colitis.   ??  Rectosigmoid Colitis - UC Flare - C-diff colitis: Ongoing bloody diarrhea up to 10x daily per patient's mother. CT abdomen with slightly worsened inflammatory changes in rectosigmoid area compared to recent CT 5 days prior. CRP rising. Flex sigmoidoscopy completed 4/29, showed severe colitis to transverse colon. Pathology is consistent with severe chronic active colitis, no evidence of CMV. Plan for colectomy on Monday.  - No steroids in anticipation for surgery    - Continue PO Vancomycin for 14 day course  - Tylenol 1000mg  TID, avoid opiods   - Zofran prn for n/v  - Continue home Xeljanz  - c/w bactrim Ppx  - Colorectal surgery and GI following   - Daily CRP   ??  Mood disorder??/ Bipolar Disorder: C/w risperidone (in replacement of non formulary paliperidone), sertraline, trazodone.  ??  Asthma: Continue home PRN albuterol, PO claritin, and PO montelukast   ??  DVT PPx: lovenox  Full Code  Regular diet     Subjective:     6 stools yesterday, 2 stools overnight. CRP downtrending. Denies abdominal pain.     Objective:     Vital signs in last 24 hours:  Temp:  [35.8 ??C-36.8 ??C] 36.1 ??C  Heart Rate:  [64-93] 93  Resp:  [16-18] 18  BP: (116-137)/(56-76) 116/76  SpO2:  [97 %-99 %] 99 %    Intake/Output last 3 shifts:  I/O last 3 completed shifts:  In: 720 [P.O.:720]  Out: -     Physical Exam:  General appearance: alert and no acute distress Abdomen: soft, no abdominal tenderness at all   Extremities: extremities normal, atraumatic, no cyanosis or edema

## 2018-10-09 NOTE — Unmapped (Signed)
Patient's vitals stable over night. No new concerns at this time. Will continue to monitor.    Problem: Adult Inpatient Plan of Care  Goal: Plan of Care Review  Outcome: Ongoing - Unchanged  Goal: Patient-Specific Goal (Individualization)  Outcome: Ongoing - Unchanged  Goal: Absence of Hospital-Acquired Illness or Injury  Outcome: Ongoing - Unchanged  Goal: Optimal Comfort and Wellbeing  Outcome: Ongoing - Unchanged  Goal: Readiness for Transition of Care  Outcome: Ongoing - Unchanged  Goal: Rounds/Family Conference  Outcome: Ongoing - Unchanged     Problem: Infection  Goal: Infection Symptom Resolution  Outcome: Ongoing - Unchanged     Problem: Pain Acute  Goal: Optimal Pain Control  Outcome: Ongoing - Unchanged

## 2018-10-09 NOTE — Unmapped (Signed)
Daily Progress Note    Assessment/Plan:    Principal Problem:    Ulcerative colitis (CMS-HCC)  Active Problems:    Bipolar 1 disorder (CMS-HCC)    Asthma    ADHD (attention deficit hyperactivity disorder)    C. difficile diarrhea       LOS: 6 days     28 y.o.??male??with a hx notable for severe Ulcerative Colitis, ADHD, Seizure disorder, Developmental delay, Bipolar disorder, hard of hearing and GERD recently admitted 4/22-4/24 for UC flair vs C-diff colitis now presenting again for persistent diarrhea and lower abdominal pain c/f UC flare and infectious rectosigmoid colitis.   ??  Rectosigmoid Colitis - UC Flare - C-diff colitis: Ongoing bloody diarrhea up to 10x daily per patient's mother. CT abdomen with slightly worsened inflammatory changes in rectosigmoid area compared to recent CT 5 days prior. CRP rising. Flex sigmoidoscopy completed 4/29, showed severe colitis to transverse colon. Pathology is consistent with severe chronic active colitis, no evidence of CMV. Plan for colectomy on Monday.  - No steroids in anticipation for surgery    - Continue PO Vancomycin for 14 day course  - Tylenol 1000mg  TID, avoid opiods   - Zofran prn for n/v  - Continue home Xeljanz  - c/w bactrim Ppx  - Colorectal surgery and GI following   - Daily CRP   ??  Mood disorder??/ Bipolar Disorder: C/w risperidone (in replacement of non formulary paliperidone), sertraline, trazodone.  ??  Asthma: Continue home PRN albuterol, PO claritin, and PO montelukast   ??  DVT PPx: lovenox  Full Code  Regular diet     Subjective:     Reports 2 BMs today. CRP downtrending. Denies abdominal pain and tolerating PO well . No pain with bowel movements.    Objective:     Vital signs in last 24 hours:  Temp:  [35.9 ??C-36.6 ??C] 36.3 ??C  Heart Rate:  [66-87] 87  Resp:  [16-18] 18  BP: (117-139)/(56-75) 119/56  SpO2:  [98 %-100 %] 98 %    Intake/Output last 3 shifts:  I/O last 3 completed shifts:  In: 355 [P.O.:355]  Out: -     Physical Exam:  General appearance: alert and no acute distress, pleasant and answers basic questions   Abdomen: soft, non-tender, non-distended

## 2018-10-10 LAB — BASIC METABOLIC PANEL
ANION GAP: 12 mmol/L (ref 7–15)
BLOOD UREA NITROGEN: 6 mg/dL — ABNORMAL LOW (ref 7–21)
BUN / CREAT RATIO: 7
CHLORIDE: 110 mmol/L — ABNORMAL HIGH (ref 98–107)
CO2: 20 mmol/L — ABNORMAL LOW (ref 22.0–30.0)
CREATININE: 0.88 mg/dL (ref 0.70–1.30)
EGFR CKD-EPI AA MALE: >90 mL/min/{1.73_m2} (ref >=60)
EGFR CKD-EPI NON-AA MALE: >90 mL/min/{1.73_m2} (ref >=60)
GLUCOSE RANDOM: 84 mg/dL (ref 70–179)
SODIUM: 142 mmol/L (ref 135–145)

## 2018-10-10 LAB — WBC ADJUSTED: Lab: 8.4

## 2018-10-10 LAB — CBC
HEMATOCRIT: 30.4 % — ABNORMAL LOW (ref 41.0–53.0)
MEAN CORPUSCULAR HEMOGLOBIN CONC: 32.5 g/dL (ref 31.0–37.0)
MEAN CORPUSCULAR HEMOGLOBIN: 27.6 pg (ref 26.0–34.0)
MEAN CORPUSCULAR VOLUME: 84.7 fL (ref 80.0–100.0)
MEAN PLATELET VOLUME: 7.8 fL (ref 7.0–10.0)
PLATELET COUNT: 397 10*9/L (ref 150–440)
RED BLOOD CELL COUNT: 3.59 10*12/L — ABNORMAL LOW (ref 4.50–5.90)
RED CELL DISTRIBUTION WIDTH: 15.9 % — ABNORMAL HIGH (ref 12.0–15.0)
WBC ADJUSTED: 8.4 10*9/L (ref 4.5–11.0)

## 2018-10-10 LAB — PHOSPHORUS: Phosphate:MCnc:Pt:Ser/Plas:Qn:: 4.5

## 2018-10-10 LAB — C-REACTIVE PROTEIN: C reactive protein:MCnc:Pt:Ser/Plas:Qn:: 10.3 — ABNORMAL HIGH

## 2018-10-10 LAB — MAGNESIUM: Magnesium:MCnc:Pt:Ser/Plas:Qn:: 1.7

## 2018-10-10 LAB — SODIUM: Sodium:SCnc:Pt:Ser/Plas:Qn:: 142

## 2018-10-10 NOTE — Unmapped (Signed)
Pt is injury free, alert, and oriented with no issues. Pt is afebrile and denies any SOB or chest pain. Will continue to monitor

## 2018-10-10 NOTE — Unmapped (Signed)
Pt remains free from falls/injury with VSS.  Pt remains on enteric precautions.  Pt denies pain.  Plan is for pt to be NPO at midnight and have procedure tomorrow.  Will continue to monitor and reassess.

## 2018-10-10 NOTE — Unmapped (Signed)
Daily Progress Note    Assessment/Plan:    Principal Problem:    Ulcerative colitis (CMS-HCC)  Active Problems:    Bipolar 1 disorder (CMS-HCC)    Asthma    ADHD (attention deficit hyperactivity disorder)    C. difficile diarrhea       LOS: 7 days     28 y.o.??male??with a hx notable for severe Ulcerative Colitis, ADHD, Seizure disorder, Developmental delay, Bipolar disorder, hard of hearing and GERD recently admitted 4/22-4/24 for UC flair vs C-diff colitis now presenting again for persistent diarrhea and lower abdominal pain secondary to UC flare.   ??  Rectosigmoid Colitis - UC Flare - C-diff colitis: Ongoing bloody diarrhea up to 10x daily per patient's mother. CT abdomen with slightly worsened inflammatory changes in rectosigmoid area compared to recent CT 5 days prior. CRP rising. Flex sigmoidoscopy completed 4/29, showed severe colitis to transverse colon. Pathology is consistent with severe chronic active colitis, no evidence of CMV. Plan for total abdominal proctocolectomy with ileostomy, possible ileal-pouch with anal anastomosis (more likely secondary stage).   - No steroids in anticipation for surgery    - Continue PO Vancomycin for 14 day course  - Tylenol 1000mg  TID, avoid opiods   - Zofran prn for n/v  - Continue home Xeljanz  - c/w bactrim Ppx  - Colorectal surgery and GI following   - Daily CRP   ??  Mood disorder??/ Bipolar Disorder: C/w risperidone (in replacement of non formulary paliperidone), sertraline, trazodone.  ??  Asthma: Continue home PRN albuterol, PO claritin, and PO montelukast   ??  DVT PPx: lovenox  Full Code  Regular diet     Subjective:     Reports 1 BM last night. CRP downtrending. Denies abdominal pain and tolerating PO well . No pain with bowel movements.    Objective:     Vital signs in last 24 hours:  Temp:  [35.9 ??C-36.7 ??C] 36.4 ??C  Heart Rate:  [61-87] 61  Resp:  [18] 18  BP: (119-138)/(56-81) 138/81  SpO2:  [98 %-100 %] 98 %    Intake/Output last 3 shifts:  No intake/output data recorded.    Physical Exam:  General appearance: alert and no acute distress, pleasant and answers basic questions   Abdomen: soft, non-tender, non-distended

## 2018-10-10 NOTE — Unmapped (Signed)
Pt remains free from falls/injury with VSS.  Enteric precautions maintained.  Pt denies needs at this time.  Will continue to monitor and reassess.

## 2018-10-11 DIAGNOSIS — R112 Nausea with vomiting, unspecified: Principal | ICD-10-CM

## 2018-10-11 LAB — BASIC METABOLIC PANEL
ANION GAP: 11 mmol/L (ref 7–15)
BLOOD UREA NITROGEN: 5 mg/dL — ABNORMAL LOW (ref 7–21)
BUN / CREAT RATIO: 6
CALCIUM: 9.1 mg/dL (ref 8.5–10.2)
CHLORIDE: 113 mmol/L — ABNORMAL HIGH (ref 98–107)
CO2: 19 mmol/L — ABNORMAL LOW (ref 22.0–30.0)
CREATININE: 0.81 mg/dL (ref 0.70–1.30)
EGFR CKD-EPI AA MALE: >90 mL/min/{1.73_m2} (ref >=60)
EGFR CKD-EPI NON-AA MALE: >90 mL/min/{1.73_m2} (ref >=60)
POTASSIUM: 3.6 mmol/L (ref 3.5–5.0)
SODIUM: 143 mmol/L (ref 135–145)

## 2018-10-11 LAB — C-REACTIVE PROTEIN: C reactive protein:MCnc:Pt:Ser/Plas:Qn:: 8.7

## 2018-10-11 LAB — POTASSIUM: Potassium:SCnc:Pt:Ser/Plas:Qn:: 3.6

## 2018-10-11 LAB — CBC
HEMATOCRIT: 29.5 % — ABNORMAL LOW (ref 41.0–53.0)
MEAN CORPUSCULAR HEMOGLOBIN CONC: 31.7 g/dL (ref 31.0–37.0)
MEAN CORPUSCULAR VOLUME: 85.6 fL (ref 80.0–100.0)
MEAN PLATELET VOLUME: 8.2 fL (ref 7.0–10.0)
PLATELET COUNT: 407 10*9/L (ref 150–440)
RED BLOOD CELL COUNT: 3.45 10*12/L — ABNORMAL LOW (ref 4.50–5.90)
RED CELL DISTRIBUTION WIDTH: 15.8 % — ABNORMAL HIGH (ref 12.0–15.0)

## 2018-10-11 LAB — PLATELET COUNT: Lab: 407

## 2018-10-11 LAB — PHOSPHORUS: Phosphate:MCnc:Pt:Ser/Plas:Qn:: 4.4

## 2018-10-11 LAB — MAGNESIUM: Magnesium:MCnc:Pt:Ser/Plas:Qn:: 1.7

## 2018-10-11 NOTE — Unmapped (Addendum)
Patient will be transferred to GI surgery team after his surgery today. He was not able to be examined prior to surgery today. Please reach out to Med W pager should any questions arise.

## 2018-10-11 NOTE — Unmapped (Signed)
Pt is injury free, alert, and oriented. Pt been NPO since midnight for possible colectomy this morning. Pt is afebrile and denies any SOB or chest pain. Will continue to monitor

## 2018-10-11 NOTE — Unmapped (Signed)
Lower GI Surgery Operative Note    Date of Surgery: 10/11/2018  Admit Date: 10/03/2018  Performing Service: Gastrointestinal    Surgeon(s) and Role:     * Lady Gary, MD - Primary     * Janee Morn, MD - Resident - Assisting     * Ardine Eng, MD - Resident - Assisting    Pre-op Diagnosis: Ulcerative Colitis    Post-op Diagnosis: Same    Procedure Performed:    URGENT COLECTOMY, TOTAL, ABDOMINAL, WITH PROCTECTOMY; WITH ILEOSTOMY    Anesthesia: General    Estimated Blood Loss(in mL): 100 mL    Complications: None    Operative Findings: Uncomplicated total abdominal colectomy with end ileostomy creation    Indications for Surgery: Patient is a 28 yo male with history of medically refractory UC. Risks and benefits of total abdominal colectomy with ileostomy creation were reviewed and patient and mother desired to proceed. Mother signed consent. Patient underwent preoperative ileostomy marking by WOCN.    Procedure: The patient was identified in the precare holding area and informed consent was confirmed. All questions were answered. The patient was taken via stretcher to operating room and placed in the supine position on the operating table. Standard lines and monitors were applied. General anesthesia was induced. Both arms remained in the anesthesia care field. Extremities were evaluated and found to be in normal range of motion with all pressure points padded. Foley was placed by physician. Patient transitioned to lithotomy position. Rectal washout was performed with dilute betadine through rectal foley tube. The patient was prepped and draped using sterile technique in the usual manner. A second timeout was called.    Midline incision was made and carried down through subcutaneous tissue to level of fascia. Fascia was elevated and the fascia and peritoneum were entered sharply. Peritoneum was then opened widely along the incision. Wound protector and retractors were then placed. We turned our attention to the colon dissection starting at the cecum. We used blunt and cautery dissection to free the appendix from a dense adhesion to the terminal ileum. Once this was freed, we took the lateral attachments by incising the White line of Toldt up to the hepatic flexure. We then turned our attention to the attachments between the omentum and the transverse colon, which was fully mobilized off of the colon with electrocautery. Once the splenic flexure was freed, we focused on mobilizing the sigmoid colon by taking down the lateral attachments. At this point the colon was completely mobilized to the level of the rectosigmoid junction. We divided the ileum just proximal to the ileocecal valve using a single fire of the GIA stapler. Starting at the ileocecal junction, we divided the mesentery sequentially with LigaSure and 2-0 Vicryl ties. The mesentery was taken from right to left along the transverse colon, splenic flexure, and sigmoid colon. We found a soft, pliable area of rectum just above the peritoneal reflection. We determined that this would be the area of our transection. We made a window in the mesentery, hugging the rectum to avoid any vascular supply. We passed a Contour stapler and came across the bowel without difficulty. The colon was delivered and passed off the field. Hemostasis was ensured.  The rectal stump was oversewn with 0 Vicryl suture. A circular skin incision was made at the previously marked right lower quadrant ostomy site. The fascia was incised using a cruciate incision and the terminal ileum was delivered through this site. We confirmed that the mesentery  to the ileum was straight and and that it lay without tension. Exparel was infused in the peritoneal, subfascial, and subcuticular spaces. Midline fascia was closed with running number 1 PDS. The wound was irrigated and the skin closed with 3-0 Vicryl suture. A Provena vacuum system was applied. We then created our end ileostomy which was brooked in the standard fashion. Ostomy appliance was applied. The rectal tube was removed. All counts were correct at the end of the procedure. The patient was awakened from anesthesia without complication and taken to the PACU for routine post-operative care.    Teaching Surgeon Attestation: Dr. Epifania Gore was present and scrubbed for the entire procedure.     I scrubbed for the entire procedure  Rae Halsted

## 2018-10-11 NOTE — Unmapped (Signed)
Brief Operative Note  (CSN: [id number])      Date of Surgery: 10/11/2018    Pre-op Diagnosis: Ulcerative Colitis    Post-op Diagnosis: Same    Procedure(s):  URGENT COLECTOMY, TOTAL, ABDOMINAL, WITH PROCTECTOMY; WITH ILEOSTOMY: 44150 (CPT??)  Note: Revisions to procedures should be made in chart - see Procedures activity.    Performing Service: Gastrointestinal  Surgeon(s) and Role:     * Lady Gary, MD - Primary     * Janee Morn, MD - Resident - Assisting     * Ardine Eng, MD - Resident - Assisting    Assistant: None    Findings: Uncomplicated total abdominal colectomy with end ileostomy creation.    Anesthesia: General    Estimated Blood Loss: 100 mL    Complications: None    Specimens:   ID Type Source Tests Collected by Time Destination   1 :  Tissue Colon SURGICAL PATHOLOGY EXAM Lady Gary, MD 10/11/2018 1227        Implants: * No implants in log *    Surgeon Notes: Dr. Epifania Gore was present and scrubbed for the entire procedure    Jackquline Denmark   Date: 10/11/2018  Time: 1:27 PM     I scrubbed for the entire procedure  Rae Halsted

## 2018-10-11 NOTE — Unmapped (Signed)
Problem: Adult Inpatient Plan of Care  Goal: Plan of Care Review  Outcome: Ongoing - Unchanged  Goal: Patient-Specific Goal (Individualization)  Outcome: Ongoing - Unchanged  Goal: Absence of Hospital-Acquired Illness or Injury  Outcome: Ongoing - Unchanged  Goal: Optimal Comfort and Wellbeing  Outcome: Ongoing - Unchanged  Goal: Readiness for Transition of Care  Outcome: Ongoing - Unchanged  Goal: Rounds/Family Conference  Outcome: Ongoing - Unchanged     Problem: Infection  Goal: Infection Symptom Resolution  Outcome: Ongoing - Unchanged     Problem: Pain Acute  Goal: Optimal Pain Control  Outcome: Ongoing - Unchanged     Problem: Wound  Goal: Optimal Wound Healing  Outcome: Ongoing - Unchanged     Problem: Fall Injury Risk  Goal: Absence of Fall and Fall-Related Injury  Outcome: Ongoing - Unchanged   Received pt from PACU, drowsy but appropriate. Mother was oriented to room and call system.

## 2018-10-11 NOTE — Unmapped (Signed)
Order was placed for a PIV by Venous Access Team (VAT).  Patient was assessed for placement of a PIV. Access was obtained. Blood return noted.  Dressing intact and device well secured.  Flushed with normal saline.  Pt advised to inform RN of any s/s of discomfort at the PIV site.    Workup / Procedure Time:  15 minutes       Driss RN was notified.       Thank you,     Melodye Ped RN Venous Access Team

## 2018-10-12 LAB — CBC
HEMATOCRIT: 29.2 % — ABNORMAL LOW (ref 41.0–53.0)
HEMOGLOBIN: 9.6 g/dL — ABNORMAL LOW (ref 13.5–17.5)
MEAN CORPUSCULAR HEMOGLOBIN CONC: 33 g/dL (ref 31.0–37.0)
MEAN CORPUSCULAR HEMOGLOBIN: 27.6 pg (ref 26.0–34.0)
MEAN CORPUSCULAR VOLUME: 83.7 fL (ref 80.0–100.0)
MEAN PLATELET VOLUME: 7.7 fL (ref 7.0–10.0)
PLATELET COUNT: 377 10*9/L (ref 150–440)
WBC ADJUSTED: 18.6 10*9/L — ABNORMAL HIGH (ref 4.5–11.0)

## 2018-10-12 LAB — EGFR CKD-EPI AA MALE: Lab: >90

## 2018-10-12 LAB — BASIC METABOLIC PANEL
BLOOD UREA NITROGEN: 4 mg/dL — ABNORMAL LOW (ref 7–21)
BUN / CREAT RATIO: 6
CALCIUM: 8.5 mg/dL (ref 8.5–10.2)
CHLORIDE: 110 mmol/L — ABNORMAL HIGH (ref 98–107)
CO2: 20 mmol/L — ABNORMAL LOW (ref 22.0–30.0)
CREATININE: 0.67 mg/dL — ABNORMAL LOW (ref 0.70–1.30)
EGFR CKD-EPI AA MALE: >90 mL/min/{1.73_m2} (ref >=60)
EGFR CKD-EPI NON-AA MALE: >90 mL/min/{1.73_m2} (ref >=60)
GLUCOSE RANDOM: 119 mg/dL (ref 70–179)
POTASSIUM: 3.8 mmol/L (ref 3.5–5.0)
SODIUM: 136 mmol/L (ref 135–145)

## 2018-10-12 LAB — MEAN CORPUSCULAR VOLUME: Lab: 83.7

## 2018-10-12 LAB — MAGNESIUM: Magnesium:MCnc:Pt:Ser/Plas:Qn:: 1.4 — ABNORMAL LOW

## 2018-10-12 LAB — PHOSPHORUS: Phosphate:MCnc:Pt:Ser/Plas:Qn:: 3.2

## 2018-10-12 NOTE — Unmapped (Signed)
Adult Nutrition Progress Note    Visit Type: (Follow up)  Reason for Visit: (PO intakes)    This patient was not seen in person. The clinical nutrition service has moved to a liaison model to minimize potential spread of COVID-19, protect patients/providers and reduce PPE utilization.  During this time, we will be limiting person-to-person contact when possible.      ASSESSMENT:  Nutritionally Pertinent Events since last RD visit:  S/p OR today for urgent TAC, with proctectomy and ileostomy. He did not answer his phone this afternoon. He has previously reported weight loss (not consistent with EMR weights) and decreased PO tolerance PTA. He has had some improved intakes initially on admit.      Nutritionally pertinent meds, labs, hospital course reviewed.    Patient Lines/Drains/Airways Status    Active Wounds     Name:   Placement date:   Placement time:   Site:   Days:    Negative Pressure Wound Therapy Abdomen Mid   10/11/18    1250    Abdomen   less than 1    Surgical Site 10/11/18 Abdomen   10/11/18    1336     less than 1                 Last 5 Recorded Weights    10/03/18 2034 10/08/18 0350 10/09/18 1730 10/10/18 1714   Weight: (!) 112.9 kg (248 lb 14.4 oz) (!) 113.1 kg (249 lb 5.4 oz) (!) 111.4 kg (245 lb 11.2 oz) (!) 112.8 kg (248 lb 9.6 oz)        Nutrition Orders          Nutrition Therapy General (Regular) starting at 05/04 1410            Daily Estimated Nutrient Needs:   Energy: 2083 kcals [Per [place] [employer]-Jeor Equation using  , 113 kg (10/05/18 1639)]  Protein: 104-130 gm [(20-25% of kcal) using admission body weight, 113 kg (10/05/18 1639)]  Carbohydrate:   [45-60% of kcal]  Fluid:   [1 mL/kcal (maintenance)]     DIAGNOSIS:  Malnutrition Assessment using AND/ASPEN Clinical Characteristics:    Patient does not meet AND/ASPEN criteria for malnutrition at this time (10/05/18 1642)         Overall nutrition impression:    - Predicted suboptimal energy intake related to increased nutritional needs; decreased appetite as evidenced by patient reported diet history  Patient may benefit from post ostomy nutrition education when available/as appropriate      GOALS:  Oral Intake:       - Patient to consume 75% of 3 meals per day.   ??  RECOMMENDATIONS AND INTERVENTIONS:  Recommend continue current nutrition therapy   Consider Ensure HP BID if patient is agreeable (he preferred to request prn on prior visit)     RD Follow Up Parameters:  1-2 times per week (and more frequent as indicated)    Gladys Damme MS, RD, LD, CNSC  [phone]

## 2018-10-12 NOTE — Unmapped (Signed)
Lower GI Surgery Progress Note    Today's Date: 10/12/2018  Admission Date: 10/03/2018  Length of Stay: 9 Days, 1 Day Post-Op  Hospital Service: [facility])  Attending Physician: Lady Gary, MD    Assessment  Principal Problem:    Ulcerative colitis (CMS-HCC)  Active Problems:    Bipolar 1 disorder (CMS-HCC)    Asthma    ADHD (attention deficit hyperactivity disorder)    C. difficile diarrhea    Austin [name] is a 28 y.o. male with a history of severe ulcerative colitis, ADHD, seizure disorder, developmental delay, bipolar disorder, hard to hearing, and GERD admitted to the medicine service with a UC flare and C. diff colitis. 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    Subjective  Interval History: The patient had mild pain and nausea overnight, mom is supportive at the bedside. The patient used his pain button to good effect. He does not have output in his ostomy appliance yet.    Plan  -IV and po pain and nausea medications as needed  -continue home ADHD, seizure, bipolar disorder medications  -medlock, consider restarting IVF if po intake is low  -diet: Nutrition Therapy General (Regular)   -WOCN consult for new ileostomy teaching  -strip vac in place, continue for 3 days postoperatively  -remove foley catheter, follow up trial of void  -strict I/O's  -continue 14 day course of po vancomycin for C. diff (4/26-5/7)  -prophylaxis: prophylactic Lovenox, SCDs, OOB/ambulate tid, IS  -access: peripheral IV  -dispo: floor    Objective  Vital signs in last 24 hours:  Temp:  [35.8 ??C (96.4 ??F)-37.1 ??C (98.8 ??F)] 36.8 ??C (98.2 ??F)  Heart Rate:  [76-112] 112  SpO2 Pulse:  [77-91] 77  Resp:  [13-21] 20  BP: (129-172)/(88-117) 172/102  MAP (mmHg):  [121-128] 128  SpO2:  [97 %-100 %] 97 %    Physical Exam:  Gen: alert, well appearing, in mild distress  CV: tachycardic with rate in 110s, hypertensive with SBP in 170s  Pulm: breathing comfortably on room air  Abd: obese, soft, nondistended and appropriately tender to palpation. Surgical incision with strip vac, adequate seal. Ileostomy stoma red, moist, nonprolapsed and appliance intact  Ext: warm and well perfused bilaterally, no edema

## 2018-10-12 NOTE — Unmapped (Signed)
Was Fall free during this shift, has wound vacc on, pain was controlled on PCA and oral meds, no s/s of infection noted, VSS acceptable ranges, will continue to monitor.   Problem: Adult Inpatient Plan of Care  Goal: Plan of Care Review  Outcome: Ongoing - Unchanged  Goal: Patient-Specific Goal (Individualization)  Outcome: Ongoing - Unchanged  Goal: Absence of Hospital-Acquired Illness or Injury  Outcome: Ongoing - Unchanged  Goal: Optimal Comfort and Wellbeing  Outcome: Ongoing - Unchanged  Goal: Readiness for Transition of Care  Outcome: Ongoing - Unchanged  Goal: Rounds/Family Conference  Outcome: Ongoing - Unchanged     Problem: Infection  Goal: Infection Symptom Resolution  Outcome: Ongoing - Unchanged     Problem: Pain Acute  Goal: Optimal Pain Control  Outcome: Ongoing - Unchanged     Problem: Wound  Goal: Optimal Wound Healing  Outcome: Ongoing - Unchanged     Problem: Fall Injury Risk  Goal: Absence of Fall and Fall-Related Injury  Outcome: Ongoing - Unchanged

## 2018-10-12 NOTE — Unmapped (Signed)
WOCN Consult Services  OSTOMY VISIT NOTE     Reason for Consult:   - Initial  - Ostomy Teaching    Problem List:   Principal Problem:    Ulcerative colitis (CMS-HCC)  Active Problems:    Bipolar 1 disorder (CMS-HCC)    Asthma    ADHD (attention deficit hyperactivity disorder)    C. difficile diarrhea    Assessment: Per EMR, Austin [name] is a 28 y.o.??male??with a hx notable for severe??Ulcerative Colitis,??ADHD, Seizure disorder, Developmental delay, Bipolar disorder, hard of hearing and GERD recently admitted 4/22-4/24 for UC flair vs C-diff colitis now presenting again for persistent diarrhea and lower abdominal pain??c/f UC flare and infectious rectosigmoid colitis. 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     Today is patient's POD #1, I met with him and his mom for initial ileostomy teach, patient slept thru the process. Mom indicated that she will be responsible for pouch change but patient needs to learn how to empty it. Mom was a CNA and she has some background knowledge about ostomy care. She will be back for final teach on the day of discharge      Stoma Type:  -  Ileostomy Stoma Location:  - RUQ (Right Upper Quadrant)     Stoma Characteristics:  - Round  - Budded Stoma Mucosal Condition and Color:  - Moist  - Pink     Mucocutaneous Junction:  - Intact    Rod/Stents:  - No     Output:  - Serosanguinous     Peristomal Skin Condition:   - Intact    Abdominal Contours:  - Rounded  - Soft  - Obese    Pouching System:  - 2 Piece  - Flat  - Stoma paste Anticipated Wear Time of Pouching System:  - 3 to 5 days     Teaching Limitations/Considerations:   - Hearing  - Cognitive impairment  - Patient sedated    Teaching/Instructions:  - Discussed agreed upon goals that the patient/family member would be able to empty and change ostomy pouch, have the instructions needed to order home supplies and know how to trouble shoot pouch leakage and common peristomal skin problems.  - Discussed and demonstrated pouch emptying technique and pouch burping technique to empty flatus from pouch.  - Patient given instruction and provided demonstration of ostomy care including removal of old pouch, cleansing of the peristomal skin, measuring the stoma, cutting out/shaping the wafer, application of stoma paste to wafer, and application of wafer/pouch to skin.  - Discussed and demonstrated the technique of crusting for peristomal skin irritation.  - Discussed and demonstrated how to shave peristomal hair.  - Discussed anticipated pouch change frequency and need to change with any signs the pouch may be leaking.  - Discussed activities of daily living, bathing, diet and preventive measures regarding dehydration.  - Reviewed how to measure and record output and to contact surgeon if having greater than 1.5 liters of output in a 24 hour period.  - Reviewed when to contact the outpatient WOC nurse with questions/concerns.    Recommendations/Plan:   - Patient will need more ostomy teaching prior to discharge, WOC nurse will continue to follow.  - WOC nurse will follow up in 1 days for pouch evaluation.  - Unit to order discharge ostomy supplies.  - Pending discharge ostomy supply list.    Ostomy Discharge Goals:  - More teaching needed . Patient needs to learn how to empty his pouch .  -  Mom will return for final teach on the day of discharge      Recommended Consults:   - Not Applicable Plan of Care Discussed With:  - Family     Ostomy Supplies:   - Unit to order.    Ostomy Product List:    Environmental consultant (Extended Wear) - Red-(050811/14603)  Hollister 2-Piece Pouch - Red- (050822/[id number])  Hollister Stoma Powder- (050829/[id number])- PRN  8M No-Sting Barrier Film- Pads- ([id number])- PRN  Hollister Stoma Paste- ([id number])       Workup Time:  60 minutes     Durward Fortes RN, BSN, [employer]  Phone: [phone]  Pager: [phone]

## 2018-10-13 LAB — CBC
MEAN CORPUSCULAR HEMOGLOBIN CONC: 32.7 g/dL (ref 31.0–37.0)
MEAN CORPUSCULAR HEMOGLOBIN: 27.9 pg (ref 26.0–34.0)
MEAN CORPUSCULAR VOLUME: 85.2 fL (ref 80.0–100.0)
MEAN PLATELET VOLUME: 7.6 fL (ref 7.0–10.0)
PLATELET COUNT: 366 10*9/L (ref 150–440)
RED BLOOD CELL COUNT: 2.99 10*12/L — ABNORMAL LOW (ref 4.50–5.90)
RED CELL DISTRIBUTION WIDTH: 15.4 % — ABNORMAL HIGH (ref 12.0–15.0)
WBC ADJUSTED: 14.8 10*9/L — ABNORMAL HIGH (ref 4.5–11.0)

## 2018-10-13 LAB — BASIC METABOLIC PANEL
ANION GAP: 10 mmol/L (ref 7–15)
BLOOD UREA NITROGEN: 5 mg/dL — ABNORMAL LOW (ref 7–21)
BUN / CREAT RATIO: 7
CALCIUM: 8.3 mg/dL — ABNORMAL LOW (ref 8.5–10.2)
CHLORIDE: 106 mmol/L (ref 98–107)
CO2: 22 mmol/L (ref 22.0–30.0)
CREATININE: 0.71 mg/dL (ref 0.70–1.30)
EGFR CKD-EPI AA MALE: >90 mL/min/{1.73_m2} (ref >=60)
GLUCOSE RANDOM: 94 mg/dL (ref 70–179)
POTASSIUM: 3.6 mmol/L (ref 3.5–5.0)

## 2018-10-13 LAB — SODIUM: Sodium:SCnc:Pt:Ser/Plas:Qn:: 138

## 2018-10-13 LAB — PHOSPHORUS: Phosphate:MCnc:Pt:Ser/Plas:Qn:: 2.6 — ABNORMAL LOW

## 2018-10-13 LAB — MEAN CORPUSCULAR HEMOGLOBIN CONC: Lab: 32.7

## 2018-10-13 LAB — MAGNESIUM: Magnesium:MCnc:Pt:Ser/Plas:Qn:: 2

## 2018-10-13 NOTE — Unmapped (Signed)
Lower GI Surgery Progress Note    Today's Date: 10/13/2018  Admission Date: 10/03/2018  Length of Stay: 10 Days, 2 Days Post-Op  Hospital Service: [facility])  Attending Physician: Lady Gary, MD    Assessment  Principal Problem:    Ulcerative colitis (CMS-HCC)  Active Problems:    Bipolar 1 disorder (CMS-HCC)    Asthma    ADHD (attention deficit hyperactivity disorder)    C. difficile diarrhea    Austin [name] is a 28 y.o. male with a history of severe ulcerative colitis, ADHD, seizure disorder, developmental delay, bipolar disorder, hard to hearing, and GERD admitted to the medicine service with a UC flare and C. diff colitis. 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    Subjective  Interval History: The patient required I/O catheterization x 1 for difficulty with voiding; after cath, he was able to void multiple times on his own volition. He reports nausea this morning and mild pain. He is not eating much. Vancomycin was stopped yesterday given he is s/p colectomy.    Plan  -IV and po pain and nausea medications as needed  -continue home ADHD, seizure, bipolar disorder medications  -1 L bolus for low po intake, nausea  -medlocked  -diet: Nutrition Therapy General (Regular), start calorie count  -IV KPhos repletion  -WOCN following for new ileostomy teaching, appreciate recs: mother to be caring for pouch changes, patient needs to be able to independently empty his appliance, RN aware  -strip vac in place, plan to remove tomorrow  -strict I/O's  -prophylaxis: prophylactic Lovenox, SCDs, OOB/ambulate tid, IS  -access: peripheral IV  -dispo: floor    Objective  Vital signs in last 24 hours:  Temp:  [36.6 ??C (97.9 ??F)-37.8 ??C (100 ??F)] 37.3 ??C (99.1 ??F)  Heart Rate:  [90-101] 93  Resp:  [16-20] 18  BP: (142-169)/(73-100) 169/93  MAP (mmHg):  [121-124] 121  SpO2:  [97 %-99 %] 97 %    Physical Exam:  Gen: alert, well appearing, in mild distress  CV: regular rate, hypertensive with DBP in 90s  Pulm: breathing comfortably on room air  Abd: obese, soft, nondistended and appropriately tender to palpation. Surgical incision with strip vac, adequate seal. Ileostomy stoma red, moist, nonprolapsed and small amount of brown stool in appliance  Ext: warm and well perfused bilaterally, no edema

## 2018-10-13 NOTE — Unmapped (Signed)
Pain managed with PRN meds.  No falls noted.  Fall protocol and VTE precautions maintained.  Reviewed plan of care.  Will continue to monitor and assess.     Problem: Adult Inpatient Plan of Care  Goal: Plan of Care Review  Outcome: Progressing  Goal: Patient-Specific Goal (Individualization)  Outcome: Progressing  Goal: Absence of Hospital-Acquired Illness or Injury  Outcome: Progressing  Goal: Optimal Comfort and Wellbeing  Outcome: Progressing  Goal: Readiness for Transition of Care  Outcome: Progressing  Goal: Rounds/Family Conference  Outcome: Progressing     Problem: Infection  Goal: Infection Symptom Resolution  Outcome: Progressing     Problem: Pain Acute  Goal: Optimal Pain Control  Outcome: Progressing     Problem: Wound  Goal: Optimal Wound Healing  Outcome: Progressing     Problem: Fall Injury Risk  Goal: Absence of Fall and Fall-Related Injury  Outcome: Progressing

## 2018-10-13 NOTE — Unmapped (Signed)
WOCN Consult Services  OSTOMY VISIT NOTE     Reason for Consult:   - Follow-up  - Ostomy Teaching    Problem List:   Principal Problem:    Ulcerative colitis (CMS-HCC)  Active Problems:    Bipolar 1 disorder (CMS-HCC)    Asthma    ADHD (attention deficit hyperactivity disorder)    C. difficile diarrhea    Assessment:     Austin [name] is a 28 y.o. male with a history of severe ulcerative colitis, ADHD, seizure disorder, developmental delay, bipolar disorder, hard to hearing, and GERD admitted to the medicine service with a UC flare and C. diff colitis. 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    Today, pt is very nauseous and has just been given nausea medicine.  He says his pain is better but he is still nauseated. His pouch is intact and not leaking. There is only scant drainage noted in bottom of pouch.  I will leave pouch in place for now.        Stoma Type:  -  Ileostomy Stoma Location:  - RUQ (Right Upper Quadrant)     Stoma Characteristics:  - Round  - Budded Stoma Mucosal Condition and Color:  - Moist  - Pink     Mucocutaneous Junction:  - Not able to assess at this time, pouch not removed    Rod/Stents:  - No     Output:  - small amount serosanguinous drainage in pouching system     Peristomal Skin Condition:   - Not able to assess at this time, pouch not removed    Abdominal Contours:  - Distended    Pouching System:  - 2 Piece  - Flat  - Stoma paste Anticipated Wear Time of Pouching System:  - To be determined     Teaching Limitations/Considerations:   - Hearing  - Cognitive impairment  - Patient sedated    Teaching/Instructions:  - Discussed with pt that he needs to practice emptying pouch with staff and WOCN when appropriate; pt very groggy after nausea med but nodded in agreement    [employer] Kit - Verbal Consent Obtained:  - No not yet    Recommendations/Plan:   - More teaching needed; Mom will return for final teach on day of discharge       Recommended Consults: - Not Applicable Plan of Care Discussed With:  - Patient  - RN Natalia Leatherwood     Ostomy Supplies:   - Unit to order.    Ostomy Product List:  PENDING  Environmental consultant (Extended Wear) - Red-(050811/[id number])  Hollister 2-Piece Pouch - Red- ([id number])  Hollister Stoma Powder- (050829/[id number])- PRN  63M No-Sting Barrier Film- Pads- ([id number])- PRN  Hollister Stoma Paste- ([id number])     Workup Time:  30 minutes

## 2018-10-13 NOTE — Unmapped (Signed)
Pain has been somewhat managed with prn medications. D/ced PCA. Remains free from falls and no signs of new skin breakdown. Removed foley, failed first trial. DTV at 2130. Replaced mag. Bed is in its lowest position, non slip socks are on and call light within reach. Will continue to monitor.  Problem: Adult Inpatient Plan of Care  Goal: Plan of Care Review  Outcome: Progressing  Goal: Patient-Specific Goal (Individualization)  Outcome: Progressing  Goal: Absence of Hospital-Acquired Illness or Injury  Outcome: Progressing  Goal: Optimal Comfort and Wellbeing  Outcome: Progressing  Goal: Readiness for Transition of Care  Outcome: Progressing  Goal: Rounds/Family Conference  Outcome: Progressing     Problem: Infection  Goal: Infection Symptom Resolution  Outcome: Progressing     Problem: Pain Acute  Goal: Optimal Pain Control  Outcome: Progressing     Problem: Wound  Goal: Optimal Wound Healing  Outcome: Progressing     Problem: Fall Injury Risk  Goal: Absence of Fall and Fall-Related Injury  Outcome: Progressing

## 2018-10-14 NOTE — Unmapped (Signed)
WOCN Consult Services  OSTOMY VISIT NOTE     Reason for Consult:   - Initial  - Ostomy Teaching    Problem List:   Principal Problem:    Ulcerative colitis (CMS-HCC)  Active Problems:    Bipolar 1 disorder (CMS-HCC)    Asthma    ADHD (attention deficit hyperactivity disorder)    C. difficile diarrhea    Assessment: Per EMR, Austin [name] is a 28 y.o.??male??with a hx notable for severe??Ulcerative Colitis,??ADHD, Seizure disorder, Developmental delay, Bipolar disorder, hard of hearing and GERD recently admitted 4/22-4/24 for UC flair vs C-diff colitis now presenting again for persistent diarrhea and lower abdominal pain??c/f UC flare and infectious rectosigmoid colitis. 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     Met with Austin [name] and patient today. Pt had nausea and vomiting overnight and has had some dry heaves today. He is starting to have more output today.      His mother appeared very nervous changing the pouch (her hands were shaking), but she did a great job and remembered the steps of pouching.    Pt has not shown interest in emptying his pouch, usually keeping his eyes closed during teaching.      Stoma Type:  -  Ileostomy Stoma Location:  - RUQ (Right Upper Quadrant)     Stoma Characteristics:  - Round  - Budded Stoma Mucosal Condition and Color:  - Moist  - Pink     Mucocutaneous Junction:  - Intact    Rod/Stents:  - No     Output:  - Serosanguinous     Peristomal Skin Condition:   - Intact    Abdominal Contours:  - Rounded  - Soft  - Obese    Pouching System:  - 2 Piece  - Flat  - Stoma paste Anticipated Wear Time of Pouching System:  - 3 to 5 days     Teaching Limitations/Considerations:   - Hearing  - Cognitive impairment  - Patient sedated    Teaching/Instructions:  - Discussed agreed upon goals that the patient/family member would be able to empty and change ostomy pouch, have the instructions needed to order home supplies and know how to trouble shoot pouch leakage and common peristomal skin problems.  - Discussed and demonstrated pouch emptying technique and pouch burping technique to empty flatus from pouch.  - Patient given instruction and provided demonstration of ostomy care including removal of old pouch, cleansing of the peristomal skin, measuring the stoma, cutting out/shaping the wafer, application of stoma paste to wafer, and application of wafer/pouch to skin.  - Discussed and demonstrated the technique of crusting for peristomal skin irritation.  - Discussed and demonstrated how to shave peristomal hair.  - Discussed anticipated pouch change frequency and need to change with any signs the pouch may be leaking.  - Discussed activities of daily living, bathing, diet and preventive measures regarding dehydration.  - Reviewed how to measure and record output and to contact surgeon if having greater than 1.5 liters of output in a 24 hour period.  - Reviewed when to contact the outpatient WOC nurse with questions/concerns.    Recommendations/Plan:   - Patient will need more ostomy teaching prior to discharge, WOC nurse will continue to follow.  - WOC nurse will follow up in 1 days for pouch evaluation.  - Unit to order discharge ostomy supplies.  - Pending discharge ostomy supply list.    Ostomy Discharge Goals:  - More teaching needed . Patient  needs to learn how to empty his pouch .  - Austin [name] will return for final teach on the day of discharge      Recommended Consults:   - Not Applicable Plan of Care Discussed With:  - Family     Ostomy Supplies:   - Unit to order.    Ostomy Product List:    Environmental consultant (Extended Wear) - Red-(050811/14603)  Hollister 2-Piece Pouch - Red- (050822/[id number])  Hollister Stoma Powder- (050829/[id number])- PRN  37M No-Sting Barrier Film- Pads- ([id number])- PRN  Hollister Stoma Paste- ([id number])       Workup Time:  60 minutes     Tonny Bollman, BSN, RN, [facility]  Wound Ostomy Consult Service  Pager [phone]

## 2018-10-14 NOTE — Unmapped (Signed)
""  Order was placed for a \""PIV by Venous Access Team (VAT)\"".  Patient was assessed for placement of a PIV. Access was obtained. Blood return noted.  Dressing intact and device well secured.  Flushed with normal saline.  Pt advised to inform RN of any s/s of discomfort at the PIV site.    Workup / Procedure Time:  15 minutes       RN was notified.       Thank you,     Marylee Floras RN Venous Access Team""

## 2018-10-14 NOTE — Unmapped (Signed)
Order was placed for a PIV by Venous Access Team (VAT).  Patient was assessed for placement of a PIV. Access was obtained. Blood return noted.  Dressing intact and device well secured.  Flushed with normal saline.  Pt advised to inform RN of any s/s of discomfort at the PIV site.    Workup / Procedure Time:  15 minutes       Primary RN was notified.       Thank you,     Seymour Bars RN Venous Access Team

## 2018-10-14 NOTE — Unmapped (Signed)
Lower GI Surgery Progress Note    Today's Date: 10/14/2018  Admission Date: 10/03/2018  Length of Stay: 11 Days, 3 Days Post-Op  Hospital Service: [facility]re[facility])  Attending Physician: Lady Gary, MD    Assessment  Principal Problem:    Ulcerative colitis (CMS-HCC)  Active Problems:    Bipolar 1 disorder (CMS-HCC)    Asthma    ADHD (attention deficit hyperactivity disorder)    C. difficile diarrhea    Austin [name] is a 28 y.o. male with a history of severe ulcerative colitis, ADHD, seizure disorder, developmental delay, bipolar disorder, hard to hearing, and GERD admitted to the medicine service with a UC flare and C. diff colitis. 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    Subjective  Interval History: Emesis x 2 overnight, volume not measured. Patient reports he feels better this am, but holding emesis tub in bed. PO intake minimal. Ostomy output 1.9 L.    Plan  -IV and po pain and nausea medications as needed  -continue home ADHD, seizure, bipolar disorder medications  -1 L bolus for low po intake, nausea/vomiting  -medlocked, restart IVF if emesis continues  -diet: Nutrition Therapy General (Regular)  Supplement Adult; Ensure High Protein (Standard High Protein); # of Products PER Serving: 1 2xd PC, calorie count  -WOCN following for new ileostomy teaching, appreciate recs: mother to be caring for pouch changes, patient needs to be able to independently empty his appliance, RN aware  -ostomy output 1.9 L yesterday, no antidiarrheals at this time given proximity to surgery and nausea/emesis  -NGT prn recurrent emesis  -strip vac in place, remove tomorrow, may remove today if interferes with WOCN teaching/ostomy appliance removal  -voiding adequately  -prophylaxis: prophylactic Lovenox, SCDs, OOB/ambulate tid, IS  -access: peripheral IV  -dispo: floor    Objective  Vital signs in last 24 hours:  Temp:  [36.9 ??C (98.4 ??F)-37.4 ??C (99.3 ??F)] 37 ??C (98.6 ??F)  Heart Rate:  [84-100] 100  Resp:  [16-18] 17  BP: (134-169)/(75-93) 142/75  MAP (mmHg):  [102-121] 102  SpO2:  [97 %-98 %] 98 %    Physical Exam:  Gen: alert, well appearing, in mild distress  CV: regular rate, hypertensive with SBPs up to 160s  Pulm: breathing comfortably on room air  Abd: obese, distended and appropriately tender to palpation. Surgical incision with strip vac, adequate seal. Ileostomy stoma red, moist, nonprolapsed and liquid stool in appliance  Ext: warm and well perfused bilaterally, no edema

## 2018-10-14 NOTE — Unmapped (Signed)
Pt had nausea and 2 episodes of green emesis overnight. Zofran and Phenergan helped to relieve nausea. Ostomy output increased. Pt has had little to no PO intake. Mom at bedside. Encouraged IS and ambulation.         Problem: Adult Inpatient Plan of Care  Goal: Plan of Care Review  Outcome: Progressing  Goal: Patient-Specific Goal (Individualization)  Outcome: Progressing  Goal: Absence of Hospital-Acquired Illness or Injury  Outcome: Progressing  Goal: Optimal Comfort and Wellbeing  Outcome: Progressing  Goal: Readiness for Transition of Care  Outcome: Progressing  Goal: Rounds/Family Conference  Outcome: Progressing     Problem: Infection  Goal: Infection Symptom Resolution  Outcome: Progressing     Problem: Pain Acute  Goal: Optimal Pain Control  Outcome: Progressing     Problem: Wound  Goal: Optimal Wound Healing  Outcome: Progressing     Problem: Fall Injury Risk  Goal: Absence of Fall and Fall-Related Injury  Outcome: Progressing     Problem: Self-Care Deficit  Goal: Improved Ability to Complete Activities of Daily Living  Outcome: Progressing

## 2018-10-14 NOTE — Unmapped (Signed)
Adult Nutrition Progress Note      Visit Type: (Follow up)  Reason for Visit: (PO intakes)     This patient was not seen in person. The clinical nutrition service has moved to a liaison model to minimize potential spread of COVID-19, protect patients/providers and reduce PPE utilization.  During this time, we will be limiting person-to-person contact when possible.    Attempted to call patient x 2 with no answer today. He remains on a regular diet. Noted ongoing nausea and decreased PO intakes per chart review. Patient has previously reported supplementing with boost and ensure in the past with good tolerance. Will initiate Ensure HP BID to help meet nutritional needs. Will continue to follow and monitor PO tolerance/adequacy, edu needs, labs, weight trends, I/os.    [address] MS, RD, LD, CNSC  [phone]

## 2018-10-15 NOTE — Unmapped (Signed)
Lower GI Surgery Progress Note    Today's Date: 10/15/2018  Admission Date: 10/03/2018  Length of Stay: 12 Days, 4 Days Post-Op  Hospital Service: [facility]re[facility] Le[facility])  Attending Physician: Lady Gary, MD    Assessment  Principal Problem:    Ulcerative colitis (CMS-HCC)  Active Problems:    Bipolar 1 disorder (CMS-HCC)    Asthma    ADHD (attention deficit hyperactivity disorder)    C. difficile diarrhea    Austin [name] is a 28 y.o. male with a history of severe ulcerative colitis, ADHD, seizure disorder, developmental delay, bipolar disorder, hard to hearing, and GERD admitted to the medicine service with a UC flare and C. diff colitis. 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    Subjective  Interval History: Persistent nausea and limited po intake, no emesis. Continues to have copious ileostomy output. Reports burning sensation in throat, new.    Plan  -IV and po pain meds prn  -zofran, scopolamine patch prn  -continue home ADHD, seizure, bipolar disorder medications  -IVF at 100 ml/hr  -diet: Nutrition Therapy General (Regular)  Supplement Adult; Ensure High Protein (Standard High Protein); # of Products PER Serving: 1 2xd PC, calorie count  -WOCN following for new ileostomy teaching, appreciate recs: mother to be caring for pouch changes, patient needs to be able to independently empty his appliance, RN aware  -ostomy output 1.9 L yesterday, no antidiarrheals at this time given proximity to surgery and nausea/emesis  -d/c strip vac  -pantoprazole BID, tums prn reflux  -voiding adequately  -prophylaxis: prophylactic Lovenox, SCDs, OOB/ambulate tid, IS  -access: peripheral IV  -dispo: floor    Objective  Vital signs in last 24 hours:  Temp:  [36.7 ??C (98.1 ??F)-37.5 ??C (99.5 ??F)] 36.9 ??C (98.4 ??F)  Heart Rate:  [79-100] 79  Resp:  [17-18] 18  BP: (130-142)/(73-86) 132/73  MAP (mmHg):  [95-106] 95  SpO2:  [95 %-99 %] 97 %    Physical Exam:  Gen: alert, well appearing, in no acute distress CV: regular rate, normotensive  Pulm: breathing comfortably on room air  Abd: obese, distended and appropriately tender to palpation. Surgical incision without cellulitis, loosely approximated, minimal drainage. Ileostomy stoma red, moist, nonprolapsed and liquid stool in appliance  Ext: warm and well perfused bilaterally, no edema

## 2018-10-15 NOTE — Unmapped (Signed)
[facility] Consult Services  OSTOMY VISIT NOTE     Reason for Consult:   - Follow-up  - Ostomy Teaching    Problem List:   Principal Problem:    Ulcerative colitis (CMS-HCC)  Active Problems:    Bipolar 1 disorder (CMS-HCC)    Asthma    ADHD (attention deficit hyperactivity disorder)    C. difficile diarrhea    Assessment: Per EMR, Austin [name] is a 28 y.o.??male??with a hx notable for severe??Ulcerative Colitis,??ADHD, Seizure disorder, Developmental delay, Bipolar disorder, hard of hearing and GERD recently admitted 4/22-4/24 for UC flair vs C-diff colitis now presenting again for persistent diarrhea and lower abdominal pain??c/f UC flare and infectious rectosigmoid colitis. 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     Today is patient's POD #4, I met with him and his mom for continuous  ileostomy teach. Mom indicated that she confident in her ability to change the pouch after meeting with [facility] yesterday. I spent time with Caryn Bee to practice emptying his pouch though sleepy but was able to keep participate with the step. I have asked the RN and CNA to encourage him to empty his pouch throughout the day. Pouch placed yesterday was intact without leakage.       Stoma Type:  -  Ileostomy Stoma Location:  - RUQ (Right Upper Quadrant)     Stoma Characteristics:  - Round  - Budded Stoma Mucosal Condition and Color:  - Moist  - Pink     Mucocutaneous Junction:  - Intact    Rod/Stents:  - No     Output:  - Green  - Thin  - Effluent     Peristomal Skin Condition:   - Not able to assess at this time, pouch not removed    Abdominal Contours:  - Rounded  - Soft  - Obese    Pouching System:  - 2 Piece  - Flat  - Stoma paste Anticipated Wear Time of Pouching System:  - 3 to 5 days     Teaching Limitations/Considerations:   - Hearing  - Cognitive impairment    Teaching/Instructions:  - Discussed agreed upon goals that the patient/family member would be able to empty and change ostomy pouch, have the instructions needed to order home supplies and know how to trouble shoot pouch leakage and common peristomal skin problems.  - Discussed and demonstrated pouch emptying technique and pouch burping technique to empty flatus from pouch.  - Patient given instruction and provided demonstration of ostomy care including removal of old pouch, cleansing of the peristomal skin, measuring the stoma, cutting out/shaping the wafer, application of stoma paste to wafer, and application of wafer/pouch to skin.  - Discussed and demonstrated the technique of crusting for peristomal skin irritation.  - Discussed and demonstrated how to shave peristomal hair.  - Discussed anticipated pouch change frequency and need to change with any signs the pouch may be leaking.  - Discussed activities of daily living, bathing, diet and preventive measures regarding dehydration.  - Reviewed how to measure and record output and to contact surgeon if having greater than 1.5 liters of output in a 24 hour period.  - Reviewed when to contact the outpatient WOC nurse with questions/concerns.    Recommendations/Plan:   - Final discharge ostomy supply list.  - Patient has met ostomy teaching goals.  - Continue with current pouching system.    Ostomy Discharge Goals:  - Patient/family member able to empty pouch independently.  - Patient/family member able  to change pouch independently.  - Patient/family member able to identify ostomy supplies to take home at discharge and knowledge of supply order process.  - RN will continue to practice emptying pouch with patient      Recommended Consults:   - Not Applicable Plan of Care Discussed With:  - Family     Ostomy Supplies:   - Take home supplies at the bedside.    Ostomy Product List:    Environmental consultant (Extended Wear) - Red-(050811/14603)  Hollister 2-Piece Pouch - Red- (050822/18003)  Hollister Stoma Powder- (050829/7906)- PRN  70M No-Sting Barrier Film- Pads- (050338/3344)- PRN  Hollister Stoma Paste- (050830/79300) Workup Time:  45 minutes     Durward Fortes RN, BSN, [employer]  Phone: [phone]  Pager: [phone]

## 2018-10-15 NOTE — Unmapped (Signed)
Patient with pain and nausea this evening, phenergan given, patient attempted some soup, ate a few bites.  Morphine for pain as patient nauseated and did not want oxy at the time.  Strip vac in place over abd wound.  IVF infusing.  Will continue to monitor.    Problem: Adult Inpatient Plan of Care  Goal: Plan of Care Review  Outcome: Progressing  Goal: Patient-Specific Goal (Individualization)  Outcome: Progressing  Goal: Absence of Hospital-Acquired Illness or Injury  Outcome: Progressing  Goal: Optimal Comfort and Wellbeing  Outcome: Progressing  Goal: Readiness for Transition of Care  Outcome: Progressing  Goal: Rounds/Family Conference  Outcome: Progressing     Problem: Infection  Goal: Infection Symptom Resolution  Outcome: Progressing     Problem: Pain Acute  Goal: Optimal Pain Control  Outcome: Progressing     Problem: Wound  Goal: Optimal Wound Healing  Outcome: Progressing     Problem: Fall Injury Risk  Goal: Absence of Fall and Fall-Related Injury  Outcome: Progressing     Problem: Self-Care Deficit  Goal: Improved Ability to Complete Activities of Daily Living  Outcome: Progressing

## 2018-10-16 NOTE — Unmapped (Signed)
Pt pain managed with prn oxycodone. Pt encourage to participate in care and ostomy teaching. Mother at bedside. Pt remains free of falls. Will continue care.    Problem: Adult Inpatient Plan of Care  Goal: Plan of Care Review  Outcome: Progressing  Goal: Patient-Specific Goal (Individualization)  Outcome: Progressing  Goal: Absence of Hospital-Acquired Illness or Injury  Outcome: Progressing  Goal: Optimal Comfort and Wellbeing  Outcome: Progressing  Goal: Readiness for Transition of Care  Outcome: Progressing  Goal: Rounds/Family Conference  Outcome: Progressing     Problem: Infection  Goal: Infection Symptom Resolution  Outcome: Progressing     Problem: Pain Acute  Goal: Optimal Pain Control  Outcome: Progressing     Problem: Wound  Goal: Optimal Wound Healing  Outcome: Progressing     Problem: Fall Injury Risk  Goal: Absence of Fall and Fall-Related Injury  Outcome: Progressing     Problem: Self-Care Deficit  Goal: Improved Ability to Complete Activities of Daily Living  Outcome: Progressing

## 2018-10-16 NOTE — Unmapped (Signed)
Lower GI Surgery Progress Note    Today's Date: 10/16/2018  Admission Date: 10/03/2018  Length of Stay: 13 Days, 5 Days Post-Op  Hospital Service: [facility])  Attending Physician: Lady Gary, MD    Assessment  Principal Problem:    Ulcerative colitis (CMS-HCC)  Active Problems:    Bipolar 1 disorder (CMS-HCC)    Asthma    ADHD (attention deficit hyperactivity disorder)    C. difficile diarrhea    Austin [name] is a 28 y.o. male with a history of severe ulcerative colitis, ADHD, seizure disorder, developmental delay, bipolar disorder, hard to hearing, and GERD admitted to the medicine service with a UC flare and C. diff colitis. 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    Subjective  Interval History: Improved nausea with the addition of scop patch, tums. Caryn Bee changed ostomy appliance independently yesterday.     Plan  -IV and po pain meds prn  -zofran, scopolamine patch prn  -continue home ADHD, seizure, bipolar disorder medications  -Medlock  -diet: Nutrition Therapy General (Regular)  Supplement Adult; Ensure High Protein (Standard High Protein); # of Products PER Serving: 1 2xd PC, calorie count  -[facility] following for new ileostomy teaching, appreciate recs: mother to be caring for pouch changes, patient needs to be able to independently empty his appliance, RN aware  -ostomy output 1.1 L yesterday, no antidiarrheals at this time given proximity to surgery and nausea/emesis  -pantoprazole BID, tums prn reflux  -voiding adequately  -prophylaxis: prophylactic Lovenox, SCDs, OOB/ambulate tid, IS  -access: peripheral IV  -dispo: floor    Objective  Vital signs in last 24 hours:  Temp:  [36.3 ??C-37.3 ??C] 36.3 ??C  Heart Rate:  [71-89] 80  Resp:  [16-18] 18  BP: (120-129)/(59-78) 124/67  MAP (mmHg):  [85-97] 88  SpO2:  [96 %-99 %] 96 %    Physical Exam:  Performed by chief resident Dr. Mallory Shirk   Gen: alert, well appearing, in no acute distress  CV: regular rate, normotensive Pulm: breathing comfortably on room air  Abd: obese, distended and appropriately tender to palpation. Surgical incision without cellulitis, loosely approximated, minimal drainage. Ileostomy stoma red, moist, nonprolapsed and liquid stool in appliance  Ext: warm and well perfused bilaterally, no edema

## 2018-10-16 NOTE — Unmapped (Signed)
Pt very sleeping throughout most of the day this shift. Pain controlled w/ prn oxy and morphine. No nausea reported. Pt tolerated regular diet well. [facility] did teaching w/ mother present. Pt encouraged to empty bag by himself; I practiced this with him after [facility] consulted. Pt encouraged to ambulate in hallway but was not up for it today; will encourage him to walk w/ night shift this evening. Will continue to monitor.    Problem: Adult Inpatient Plan of Care  Goal: Plan of Care Review  Outcome: Progressing  Goal: Patient-Specific Goal (Individualization)  Outcome: Progressing  Goal: Absence of Hospital-Acquired Illness or Injury  Outcome: Progressing  Goal: Optimal Comfort and Wellbeing  Outcome: Progressing  Goal: Readiness for Transition of Care  Outcome: Progressing  Goal: Rounds/Family Conference  Outcome: Progressing     Problem: Infection  Goal: Infection Symptom Resolution  Outcome: Progressing     Problem: Pain Acute  Goal: Optimal Pain Control  Outcome: Progressing     Problem: Wound  Goal: Optimal Wound Healing  Outcome: Progressing     Problem: Fall Injury Risk  Goal: Absence of Fall and Fall-Related Injury  Outcome: Progressing     Problem: Self-Care Deficit  Goal: Improved Ability to Complete Activities of Daily Living  Outcome: Progressing

## 2018-10-17 MED ORDER — ONDANSETRON HCL 4 MG TABLET
ORAL_TABLET | Freq: Four times a day (QID) | ORAL | 0 refills | 0.00 days | Status: CP | PRN
Start: 2018-10-17 — End: 2018-11-09
  Filled 2018-10-17: qty 30, 7d supply, fill #0

## 2018-10-17 MED ORDER — PANTOPRAZOLE 40 MG TABLET,DELAYED RELEASE
ORAL_TABLET | Freq: Two times a day (BID) | ORAL | 2 refills | 30.00 days | Status: SS
Start: 2018-10-17 — End: 2019-01-29

## 2018-10-17 MED ORDER — OXYCODONE 5 MG TABLET
ORAL_TABLET | ORAL | 0 refills | 0.00 days | Status: CP | PRN
Start: 2018-10-17 — End: 2018-10-22
  Filled 2018-10-17: qty 20, 5d supply, fill #0

## 2018-10-17 MED ORDER — CALCIUM CARBONATE 200 MG CALCIUM (500 MG) CHEWABLE TABLET
ORAL_TABLET | Freq: Three times a day (TID) | ORAL | 0 refills | 0.00000 days | Status: CP | PRN
Start: 2018-10-17 — End: 2018-12-06
  Filled 2018-10-17: qty 150, 50d supply, fill #0

## 2018-10-17 MED FILL — PANTOPRAZOLE 40 MG TABLET,DELAYED RELEASE: ORAL | 30 days supply | Qty: 60 | Fill #0

## 2018-10-17 MED FILL — PANTOPRAZOLE 40 MG TABLET,DELAYED RELEASE: 30 days supply | Qty: 60 | Fill #0 | Status: AC

## 2018-10-17 MED FILL — ONDANSETRON HCL 4 MG TABLET: 7 days supply | Qty: 30 | Fill #0 | Status: AC

## 2018-10-17 MED FILL — OXYCODONE 5 MG TABLET: 5 days supply | Qty: 20 | Fill #0 | Status: AC

## 2018-10-17 MED FILL — TRANSDERM-SCOP 1.5 MG TRANSDERMAL PATCH (1 MG OVER 3 DAYS): 30 days supply | Qty: 10 | Fill #0 | Status: AC

## 2018-10-17 MED FILL — ANTACID (CALCIUM CARBONATE) 200 MG CALCIUM (500 MG) CHEWABLE TABLET: 50 days supply | Qty: 150 | Fill #0 | Status: AC

## 2018-10-17 NOTE — Unmapped (Signed)
Pt A&O X 4. Mother at bedside. Pt encourage to participate in ADl's. Pt remains free of falls during this shift. Pt encourage to get up to chair during shift. No new skin break down noted. Vital signs stable. Bed is locked and in lowest position. Call bell within reach. Will continue care.       Problem: Adult Inpatient Plan of Care  Goal: Plan of Care Review  Outcome: Progressing  Goal: Patient-Specific Goal (Individualization)  Outcome: Progressing  Goal: Absence of Hospital-Acquired Illness or Injury  Outcome: Progressing  Goal: Optimal Comfort and Wellbeing  Outcome: Progressing  Goal: Readiness for Transition of Care  Outcome: Progressing  Goal: Rounds/Family Conference  Outcome: Progressing     Problem: Infection  Goal: Infection Symptom Resolution  Outcome: Progressing     Problem: Pain Acute  Goal: Optimal Pain Control  Outcome: Progressing     Problem: Wound  Goal: Optimal Wound Healing  Outcome: Progressing     Problem: Fall Injury Risk  Goal: Absence of Fall and Fall-Related Injury  Outcome: Progressing     Problem: Self-Care Deficit  Goal: Improved Ability to Complete Activities of Daily Living  Outcome: Progressing

## 2018-10-17 NOTE — Unmapped (Signed)
Pt has not had a fall during this shift. Pt did not c/o pain during this shift.Pt has shown no s/sx of skin breakdown during this shift. Discharge needs will be assessed prior to discharge. Will continue to monitor.      Problem: Adult Inpatient Plan of Care  Goal: Plan of Care Review  Outcome: Progressing  Goal: Patient-Specific Goal (Individualization)  Outcome: Progressing  Goal: Absence of Hospital-Acquired Illness or Injury  Outcome: Progressing  Goal: Optimal Comfort and Wellbeing  Outcome: Progressing  Goal: Readiness for Transition of Care  Outcome: Progressing  Goal: Rounds/Family Conference  Outcome: Progressing     Problem: Infection  Goal: Infection Symptom Resolution  Outcome: Progressing     Problem: Pain Acute  Goal: Optimal Pain Control  Outcome: Progressing  Intervention: Develop Pain Management Plan  Flowsheets (Taken 10/16/2018 2008)  Pain Management Interventions: medication offered but refused     Problem: Wound  Goal: Optimal Wound Healing  Outcome: Progressing  Intervention: Promote Effective Wound Healing  Flowsheets (Taken 10/16/2018 2008)  Pain Management Interventions:      Problem: Fall Injury Risk  Goal: Absence of Fall and Fall-Related Injury  Outcome: Progressing  Intervention: Identify and Manage Contributors to Fall Injury Risk  Flowsheets (Taken 10/16/2018 2008)  Medication Review/Management: medications reviewed  Self-Care Promotion:   independence encouraged   BADL personal objects within reach  Intervention: Promote Injury-Free Environment  Flowsheets (Taken 10/16/2018 2008)  Safety Interventions:   fall reduction program maintained   family at bedside   low bed     Problem: Self-Care Deficit  Goal: Improved Ability to Complete Activities of Daily Living  Outcome: Progressing  Intervention: Promote Activity and Functional Independence  Flowsheets (Taken 10/16/2018 2008)  Self-Care Promotion:   independence encouraged   BADL personal objects within reach

## 2018-10-17 NOTE — Unmapped (Signed)
[facility] Discharge Summary     Identifying Information: Chuck Hint  25-Nov-1990  [id number]   Code Status: Full Code   PCP:  FAMILY M CASWELL   Primary Surgeon:  Primary: Lady Gary, MD  Resident - Assisting: Janee Morn, MD; Ardine Eng, MD       Admit date: 10/03/2018   Discharge date: 10/17/2018   Discharge Attending: Lady Gary, MD  Discharge Service: Henreitta [facility])  Discharge to: Home     Discharge Primary Diagnosis:   Principal Problem:    Ulcerative colitis (CMS-HCC)  Active Problems:    Bipolar 1 disorder (CMS-HCC)    Asthma    ADHD (attention deficit hyperactivity disorder)    C. 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 Difficile infection who presented as a bounceback to the Medicine W service on 10/14/18 with recurrence of abdominal pain and frequent bloody diarrhea despite compliance with PO vancomycin. CT abdomen with slightly worsened inflammatory changes in rectosigmoid area compared to recent CT 5 days prior. Patient was admitted for IVF's and monitoring of diarrhea while ensuring compliance with Vancomycin. Unfortunately, the frequency of his bloody diarrhea did not improve, and his CRP rose to 118. GI performed flexible sigmoidoscopy on 4/29, which showed severe colitis to transverse colon, and biopsy taken. Pathology  was consistent with severe chronic active colitis, no evidence of CMV. GI surgery was consulted, who planned to take him to their service on Monday 10/11/2018 for a total colectomy.     Surgery hospital course:  Chuck Hint was taken to the OR on 10/11/2018 for a total abdominal colectomy with end ileostomy. He tolerated the procedure well, was extubated in the OR, and was taken to the PACU and received routine postoperative care before being transferred to the floor. He required an I/O catheterization once during his POD 1 trial of void, but was able to void without difficulty after the catheterization. He developed nausea and emesis on POD 2, this resolved with antiemetics.  His diet was slowly advanced and at the time of discharge, he was tolerating a regular diet. Pain medications were transitioned to oral without difficulty. Wound and ostomy nurses taught the patient and his mother how to change his ostomy appliance and his mother was able to demonstrate pouch changes appropriately. He is being discharged on 10/17/2018.    Condition at Discharge: good  Patient is back to their functional baseline and needs assistance with some activites of daily living.     Discharge Day Note:     Assessment and Discharge Day Services:  The patient was seen and examined by the [facility]) team on the day of discharge. Vital signs and laboratory values were assessed. Surgical wounds were inspected. Discharge plan was discussed, instructions were given, and all questions answered.     Subjective:  No acute events overnight. Pain Controlled. No nausea, tolerating diet with good oral intake.     Objective:   BP 141/89  -  Pulse 69  - Temp 37.1 ??C (Oral)  - Resp 18  - Ht 174 cm (5' 8.5)  - Wt (!) 112.8 kg (248 lb 9.6 oz)  - SpO2 96%  - BMI 37.25 kg/m??     General Appearance:   No acute distress  Abdomen:               obese, non distended and non tender to palpation. Surgical incision without cellulitis, loosely approximated, minimal drainage. Ileostomy stoma red, moist, nonprolapsed and liquid stool in appliance  Discharge Medications: Your Medication List      STOP taking these medications    HEARTBURN RELIEF (FAMOTIDINE) 10 MG tablet  Generic drug:  famotidine     vancomycin 125 MG capsule  Commonly known as:  VANCOCIN        START taking these medications    calcium carbonate 200 mg calcium (500 mg) chewable tablet  Commonly known as:  TUMS  Chew 1 tablet (200 mg of elem calcium total) Three (3) times a day as needed.     oxyCODONE 5 MG immediate release tablet  Commonly known as:  ROXICODONE  Take 1 tablet (5 mg total) by mouth every four (4) hours as needed for up to 5 days.     pantoprazole 40 MG tablet  Commonly known as:  PROTONIX  Take 1 tablet (40 mg total) by mouth Two (2) times a day.     scopolamine 1 mg over 3 days  Commonly known as:  TRANSDERM-SCOP  Place 1 patch (1.5 mg total) on the skin every third day.  Start taking on:  Oct 18, 2018        CHANGE how you take these medications    ondansetron 4 MG tablet  Commonly known as:  ZOFRAN  Take 4 mg by mouth every eight (8) hours as needed for nausea.  What changed:  Another medication with the same name was added. Make sure you understand how and when to take each.     ondansetron 4 MG tablet  Commonly known as:  ZOFRAN  Take 1 tablet (4 mg total) by mouth every six (6) hours as needed for nausea for up to 30 doses.  What changed:  You were already taking a medication with the same name, and this prescription was added. Make sure you understand how and when to take each.        CONTINUE taking these medications    albuterol 90 mcg/actuation inhaler  Commonly known as:  PROVENTIL HFA;VENTOLIN HFA  Inhale 2 puffs every four (4) hours as needed.     albuterol 2.5 mg /3 mL (0.083 %) nebulizer solution  Inhale 2.5 mg every four (4) hours as needed.     cholecalciferol 1,000 unit (25 mcg) tablet  Generic drug:  cholecalciferol (vitamin D3)  Take 1,000 Units by mouth daily.     EPINEPHrine 0.3 mg/0.3 mL injection  Commonly known as:  EPIPEN  Inject 0.3 mg into the muscle.     loratadine 10 mg tablet  Commonly known as:  CLARITIN  Take 10 mg by mouth daily.     montelukast 10 mg tablet  Commonly known as:  SINGULAIR  Take 1 tablet (10 mg total) by mouth nightly.     paliperidone 3 MG 24 hr tablet  Commonly known as:  INVEGA  Take 3 mg by mouth daily at 10am.     propranoloL 10 MG tablet  Commonly known as:  INDERAL  TK 1  T PO TID     sertraline 100 MG tablet  Commonly known as:  ZOLOFT  Take 75 mg by mouth daily.     sulfamethoxazole-trimethoprim 400-80 mg per tablet  Commonly known as:  BACTRIM  Take 1 tablet (80 mg of trimethoprim total) by mouth daily.     traZODone 50 MG tablet  Commonly known as:  DESYREL  Take 50 mg by mouth nightly.     XELJANZ 10 mg Tab  Generic drug:  tofacitinib  Take 1 tablet (10 mg) by mouth Two (2) times a day.              Discharge Instructions:    Other Instructions     Discharge instructions      What to expect after discharge for Lower GI Surgery      You had the following surgery: total abdominal colectomy, end ileostomy    Your surgeon is:  Dr. Epifania Gore    To reach your nurse coordinator, call:   Marrianne Mood at [phone]        Nurse Coordinator for Dr. Zadie Rhine Njoroge at [phone]        Nurse Coordinator for Dr. Ramonita Lab next?  Recovery continues at home.   Recovery does not end once you leave the hospital.  At home, you will continue to make progress - your appetite and energy level will slowly improve. You may have an occasional bad day where you don't feel as good as you did the day before, but then have a few good days to follow. Overall, you should be improving, if you think you are having too many bad days something may be wrong, and we want to hear from you - please call us at the phone numbers listed on the front page of this packet.     Activity  You should continue walking, climbing stairs, and may ride in a car.   You should not drive if you are taking prescription pain medications. We will discuss when you can drive again at your follow-up visit.  You should avoid strenuous activity, exercise or heavy lifting (more than 20 pounds) for 14 days after surgery. We will discuss when you can return to full activities, with no restrictions, at your follow-up visit.  You can shower after discharge, but do not submerge your wounds in any water for 2 weeks.   If your wounds were closed with glue:   Use regular soap and water.   Avoid rubbing the incisions and pulling the glue.   Pat dry when you are finished.   The glue will start to peel and fall off when the wound is healed.  If your wounds are covered with gauze or a dressing:  Remove all bandages (unless you were instructed otherwise).  Use regular soap and water. Soap and water can fall into the wounds safely.   Pat your wounds dry.   If you think your wound still needs to be covered, place a new dressing over the wound.    Bowel movements  If you have an ostomy, please look for further information regarding expectations in the section titled New Ostomy.  Your bowel function will be unpredictable at first, but will become more predictable with time.    Constipation: You should try to have a bowel movement at least every other day. If two days pass without one, take an ounce of milk of magnesia (available over-the-counter). If there is no  result, repeat this dose in six hours. If there is no result within 24 hours of repeating the dose, call your Nurse Coordinator.  Diarrhea: Diarrhea can also be a concern. If you were having loose bowel movements in the hospital and now you are having them more frequently, and you are not taking stool softeners or laxatives, call your Nurse Coordinator. If you are having watery bowel movements and you are taking stool softeners or laxatives, you should stop taking these medications. If your symptoms don't improve within 24 hours, call your Nurse Coordinator.    Medications  If necessary, you will receive a prescription for an anti-nausea medication. If you did not, and feel that you need some, please call your Nurse Coordinator.  This After Visit Summary also contains a list of all medications that you may have started, need to stop, or will need to change when you get home. Please call your Nurse Coordinator if you have any questions about your medication list.     Pain  You will still have some pain and discomfort after surgery when you go home, this is normal and expected. As time passes and you become more active, this will slowly improve.   If you are still having pain at the time of discharge, you can continue to take Tylenol (available over-the-counter) every 4-6 hours following the instructions on the bottle. This will help to reduce the amount of prescription medication you will need to control your pain during recovery. If you have been told that you should not take Tylenol by any of your doctors, please call our office to discuss this medication before you take it.  At discharge, you will likely receive a prescription for pain medication that is expected to last you for at least a few days. You should start to decrease how often you are taking this medication over the first few days at home. If you think you will run out of pain medications before your follow-up appointment, and believe you need more, please call to discuss with your Nurse Coordinator.    Follow-up  At discharge, you will receive a follow-up clinic appointment to see your surgeon, usually within 2-3 weeks. The purpose of this visit is to make sure you are healing and recovering. If you do not get this appointment on discharge, you should call the GI Surgery Clinic or your Nurse Coordinator within 3 days to schedule it.      New Ostomy  Your ostomy supply company is: [facility]    Your ostomy supply list includes:   Environmental consultant (Extended Wear) - Red-([id number])  [employer] 2-Piece Pouch - Red- ([id number])  [employer] Stoma Powder- (050829/[id number])- PRN  79M No-Sting Barrier Film- Pads- ([id number])- PRN  [employer] Stoma Paste- ([id number])     I have a new ostomy, now what?  You should receive separate, more detailed, instructions and booklets regarding your ostomy along with this After Visit Summary.   If you have any questions regarding your ostomy care, supplies or need instructions on managing leaks or other issues, please call the Outpatient Ostomy Clinic at [phone].   While living with an ostomy, you are at high risk for dehydration. Because of this, we will teach you to monitor your ostomy and urine output and look out for signs of dehydration. If you are dehydrated you might notice your urine is dark in color, or that you are not urinating as often as you normally do. You might feel lightheaded, dizzy or weak.  If you think you are dehydrated, drink more fluids and call your Nurse Coordinator.     In the hospital, you were instructed on how to measure your 24-hour ostomy output using a measuring device and a log. Please continue to do this until your first follow-up appointment. If your output is ever over 1 liter in a 24-hour period, or you have not passed any gas or stool through your ostomy for 24 hours, please call your Nurse Coordinator for further instructions.               Home health orders: @[facility] @    Future Appointments:  Appointments which have been scheduled for you    Nov 09, 2018  9:00 AM EDT  (Arrive by 8:30 AM)  PHONE with Zetta Bills, MD  [facility]) [address]  [place] [address]  [phone]   Please DO NOT come to the clinic for this visit. We will call you to discuss your plan of care.       Additional instructions:      F/U scheduled for 5/5 @ 3:30 pm with Dr. [facility].  This will be a virtual call.  Patient will be called the day before.  Please fax medical records and d/c summary to [phone], attention Malachi Bonds.  SH

## 2018-10-18 MED ORDER — SCOPOLAMINE 1 MG OVER 3 DAYS TRANSDERMAL PATCH
MEDICATED_PATCH | TRANSDERMAL | 0 refills | 0.00 days | Status: CP
Start: 2018-10-18 — End: 2018-11-17
  Filled 2018-10-17: qty 10, 30d supply, fill #0

## 2018-10-19 NOTE — Unmapped (Signed)
05/12-Xeljanz refill-pt is no longer taking Xeljanz,requested to call back in 1 month to see if regimen changed before dis-enrolling

## 2018-10-28 NOTE — Unmapped (Signed)
TC from pt's mother. Pt has not received ostomy supplies and he only has 1 bag left. She called [facility] and they have not received the orders. I called [facility] but was unsuccessful. I sent a new order to [facility], followed with a phone call to confirm. They confirmed receipt of the order and they will send pt emergency supplies.

## 2018-11-06 IMAGING — NM NM HEPATO W/GB/PHARM/[PERSON_NAME]
2 series · 12 of 12 positions shown · non-contrast
Comparison: None

CLINICAL DATA: RIGHT upper quadrant pain for few weeks, nausea
after eating

EXAM:
NUCLEAR MEDICINE HEPATOBILIARY IMAGING WITH GALLBLADDER EF
TECHNIQUE: Sequential images of the abdomen were obtained [DATE] minutes
following intravenous administration of radiopharmaceutical. After
oral ingestion of Ensure, gallbladder ejection fraction was
determined. At 60 min, normal ejection fraction is greater than 33%.
RADIOPHARMACEUTICALS:  5.5 mCi Jc-FFm  Choletec IV

[Series 1: biliary · 3.25mm/px · 6 of 60 frames shown]
[frame 6/60]
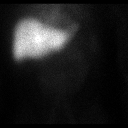
[frame 16/60]
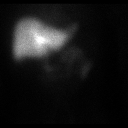
[frame 26/60]
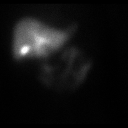
[frame 36/60]
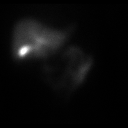
[frame 46/60]
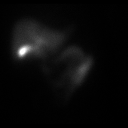
[frame 56/60]
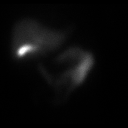

[Series 2: gbef · 3.25mm/px · 6 of 60 frames shown]
[frame 6/60]
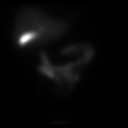
[frame 16/60]
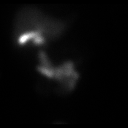
[frame 26/60]
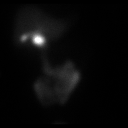
[frame 36/60]
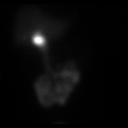
[frame 46/60]
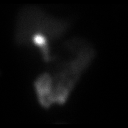
[frame 56/60]
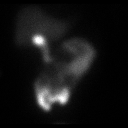

[12 of 12 positions shown; findings below may reference images not displayed]

FINDINGS: Normal tracer extraction from bloodstream indicating normal
hepatocellular function.

Normal excretion of tracer into biliary tree.

Gallbladder visualized at 21 min.

Small bowel visualized at 12 min.

No hepatic retention of tracer.

Subjectively normal emptying of tracer from gallbladder following
fatty meal stimulation.

Calculated gallbladder ejection fraction is 97%, normal.

Patient reported no symptoms following Ensure ingestion.

Normal gallbladder ejection fraction following Ensure ingestion is
greater than 33% at 1 hour.
IMPRESSION: Normal exam.

## 2018-11-09 ENCOUNTER — Institutional Professional Consult (permissible substitution): Admit: 2018-11-09 | Discharge: 2018-11-10 | Payer: MEDICAID

## 2018-11-09 ENCOUNTER — Ambulatory Visit: Admit: 2018-11-09 | Discharge: 2018-11-10 | Payer: MEDICAID | Attending: Surgery | Primary: Surgery

## 2018-11-09 DIAGNOSIS — F819 Developmental disorder of scholastic skills, unspecified: Secondary | ICD-10-CM

## 2018-11-09 DIAGNOSIS — K51811 Other ulcerative colitis with rectal bleeding: Principal | ICD-10-CM

## 2018-11-09 DIAGNOSIS — Z932 Ileostomy status: Secondary | ICD-10-CM

## 2018-11-09 DIAGNOSIS — K51919 Ulcerative colitis, unspecified with unspecified complications: Principal | ICD-10-CM

## 2018-11-09 NOTE — Unmapped (Signed)
Your surgery has been scheduled for 01/03/2019     [facility] will call you the day before surgery with the exact time to arrive on the day of surgery, medication and diet instructions. Their telephone # is [phone].      If given Ensure, please drink it 1hour prior to your arrival to the hospital for surgery.      On the day of surgery you will check in to the Registration Office at the [facility] lobby.     If you are having outpatient surgery, you will need a licensed driver.    If you have any questions or concerns regarding your appointment today please call   Everlene Balls, RN for Dr. Rae Halsted at [phone] or [phone]. Fax: [phone]   Clinic# [phone]

## 2018-11-09 NOTE — Unmapped (Signed)
Colorectal Surgery Follow-up Clinic Note      Attending Physician:  Emilia Beck, MD   Date of Service: 11/09/2018  Referring Provider: Estill Dooms        Assessment/Plan:    Austin [name] is a 28 y.o. male with history that includes obesity, autoimmune hepatitis, GERD, seizure disorder, developmental delay, and medically refractory UC s/p total abdominal colectomy with end ileostomy 10/11/2018. He is recovering as expected. WOCN evaluation today for pouching difficulties, please see separate dictation. We will plan for ileostomy takedown, ileal pouch anal anastomosis, possible diverting loop ileostomy 01/03/2019. Consent obtained today from mother and all questions answered.      History of Present Illness:     Austin [name] is a multimorbid 28 y.o. 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 Difficile infection that presented again in May with recurrent abdominal pain and bloody BMs, ultimately found to have severe medically refractory UC. He underwent uncomplicated TAC with end ileostomy 10/11/2018 with uncomplicated hospital course and discharge 10/17/2018.    Today in clinic, he denies abdominal pain, nausea, emesis, fevers, or chills. Mom does endorse daily leakage of ostomy appliance along medial aspect of device. She denies high volume output. No signs/symptoms of dehydration.       Allergies, Medications, Past Medical History, Past Surgical History, Family History, and Social History were reviewed in Epic.      Review of Systems    A 12 system review of systems was negative except as noted in HPI        Vital Signs    BP 110/69  - Pulse 82  - Ht 174 cm (5' 8.5)  - Wt (!) 111.1 kg (245 lb)  - BMI 36.71 kg/m??        Physical Exam    General: Cooperative, no distress, well appearing  Head: Normocephalic, atraumatic  Eyes: Pupil equal and round, conjunctiva not injected  Nose: Nares grossly normal, no drainage  Neck: Midline trachea, supple, symmetrical  Pulmonary: Normal work of breathing, equal bilateral chest rise  Cardiovascular: Regular rate and rhythm  Abdomen: Soft, non-tender, non-distended. No hernias appreciated. Prior midline incision with small amount of granulation tissue, no drainage. RLQ ileostomy healthy and pink with small amount of elevation.  Musculoskeletal: Good range of motion in bilateral upper/lower extremities.  Skin: No jaundice, rashes, erythema, or lesions. Skin color and texture normal.  Neurologic: Alert and interactive, no motor abnormalities noted. Sensation grossly intact.    Labs and Studies      Imaging:   Radiology studies were personally reviewed    Surgical Pathology 10/11/2018    Final Diagnosis   A: Colon with terminal ileum and appendix, total colectomy  - Moderately active chronic colitis with crypt abscesses and mucosal hemorrhage, with relative proximal sparing, consistent with ulcerative colitis  - Proximal terminal ileum margin without significant active inflammation  - Distal colonic margin with mildly to moderately active chronic colitis   - Benign appendix also present with involvement by active chronic inflammation  - No dysplasia, granulomas, or viral cytopathic effect identified   - Twelve benign reactive lymph nodes examined microscopically (0/12)         Problem List    Patient Active Problem List   Diagnosis   ???  Ulcerative colitis (CMS-HCC)   ??? Bipolar 1 disorder (CMS-HCC)   ??? Asthma   ??? Elevated LFTs   ??? Autoimmune hepatitis (CMS-HCC)   ??? ADHD (attention deficit hyperactivity disorder)   ??? History of seizure   ??? Iron deficiency anemia   ??? C. difficile diarrhea   ??? Normocytic anemia   ??? Ulcerative colitis with complication (CMS-HCC)          11/11/2018    I saw and evaluated the patient, participating in the all the portions of the service.  I reviewed the resident's note.  I agree with the resident's findings and plan.     Rae Halsted MD

## 2018-11-09 NOTE — Unmapped (Signed)
Preop ERAS pathway protocol education completed. Pt is allergic to strawberry. Advised to purchase 4oz gatorade

## 2018-11-09 NOTE — Unmapped (Signed)
Surgery scheduled for 01/03/2019. Consent obtained and witnessed

## 2018-11-09 NOTE — Unmapped (Signed)
[facility]   FOLLOWUP NOTE - INFLAMMATORY BOWEL DISEASE  11/09/2018    Demographics:  Austin [name] is a 28 y.o. year old male    Diagnosis:  Ulcerative Colitis  Disease onset (yr): 31  Age at onset:   69-40 yr old (A2)  Location:  Unknown  Behavior:  Colitis  Current Tight Control Scenario:   Maintenance = Biologic          HPI / NOTE :     PHONE NOTE DUE TO COVID19 - Patient's mother was on the phone and provided most of the history and decision making support     Interval Events:   1. Since last visit with me, Austin [name] unfortunately progressed to medically refractory colitis despite high dose xeljanz and steroids.    2.  He hence was admitted to [facility] and had a colectomy with end ileostomy on 10/11/2018.  I reviewed operative notes, discharge summary and pathology results from that admission and surgery.   3.  He will be seeing Dr. Epifania Gore today to discuss timing of second surgery for ostomy takedown and IPAA.     HPI:  According to mom, Austin [name] has been feeling much better since his surgery. He is having less abdominal pain.  Eating well, weight has stabilized.  Ostomy output is loose, emptying bag 6-8x/day (more than half to 3/4 full each time).      Mom does report frequent leakage of ostomy effluent around the left side of the ostomy appliance.  He will be coming in today to see surgery clinic and ostomy nurse.     Abdominal pain (0-10):  none  BM per day:  Empties ostomy 6-8x/day  Consistency: loose  % of stools have blood: 0  Nocturnal BM: NA - ostomy  Weight change over last 6 mo: stable x 1 month  Smoking: no  NSAIDS: avoids    Review of Systems:   Review of systems positive for: negative except as above.   Otherwise, the balance of 10 systems is negative.          IBD HISTORY:     Year of disease onset:  2015    Brief IBD Disease Course:    - 2015 - crampy abdominal pain, diarrhea, hematochezia.  Had colonoscopy and dx with UC. Rx budesonide and mesalamine which did not work.  No medications for 1-2 years after that.   - 2017 - Started on Infliximab with improvement.  October 2017 - developed C.diff but not able to be cleared until Feb 2018.  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 Pt given prednisone taper and started on Vedolizumab 03/2017. Had 2 brief flares after starting Vedolizumab (Jan 2019 - brief prednisone burst).    - 2019 - gap in Entyvio for ~5 months.  Re-load + prednisone in Oct 2019 but ongoing symptoms.  Admitted to [facility] Jan 2020 with severe colitis - got IV Steroids with initial improvement.   - 2020 - tried [name] - had very little improvement with 3 months of high dose Xeljanz and steroids.  Had colectomy 10/11/2018 for medically refractory colitis Epifania Gore). Pathology consistent with UC.     Endoscopy:      - Colonoscopy 02/12/17 - poor prep.  Mild inflammation from rectum to descending colon (Mayo 1).  Remaining colon and terminal ileum normal.     PATH = mildly active chronic colitis, no granulomas.    - Colonoscopy 08/19/17 - Normal ileum.  Friable mucosa in sigmoid, patchy inflammation in Rectum only (Mayo 1).  Improved from prior colonoscopy 02/2017.   - Sigmoidoscopy 06/21/18 - Severe (Mayo 3) colitis from anus to transverse colon, worse than prior.   - Sigmoidoscopy 10/06/2018 - Mayo 3 colitsi unchanged from prior    Imaging:    - CT abd pel w/ IV contrast 06/17/18: mild wall thickening from transverse to rectum; mild fatty infiltration of sigmoid and rectal wall.    Prior IBD medications (type, dose, duration, response):  x 5-ASAs - briefly after dx- no response  x Oral corticosteroids - budesonide no response  x Intravenous corticosteroids - Jan 2020  ? Antibiotics  ? Thiopurines  ? 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 Anti-Interleukin therapies  X Anti-JAK therapies - [name] - Jan 2020 ? Cyclosporine  ? Clinical trial medication  ? Other (Please specify):    IBD health maintenance:  Influenza vaccine: 03/2018  Pneumonia vaccine: 23 valent in 2017; 13 valent in 2019  Hepatitis B: 1994-1995  TB testing: Quant gold Oct 2019  Chickenpox/Shingles history: had chickenpox as a child  Bone denistometry:   Derm appointment:  Last small bowel imaging: Jan 2020  Last colonoscopy: March 2019    Extraintestinal manifestations:   -joint pains affecting: n  -eye: n  -skin: n  -oral ulcers :  n  -blood clots: n  -PSC: n  -other: n          Past Medical History:   Past medical history:   Past Medical History:   Diagnosis Date   ??? ADHD    ??? Asthma    ??? Bipolar 1 disorder (CMS-HCC)    ??? Depression    ??? GERD (gastroesophageal reflux disease)    ??? HOH (hard of hearing)    ??? Hypertension    ??? Mental developmental delay    ??? Seizures (CMS-HCC)     last seizures 2016   ??? Ulcerative colitis (CMS-HCC)      Past surgical history:   Past Surgical History:   Procedure Laterality Date   ??? ADENOIDECTOMY     ??? PR COLONOSCOPY FLX DX W/COLLJ SPEC WHEN PFRMD N/A 08/19/2017    Procedure: COLONOSCOPY, FLEXIBLE, PROXIMAL TO SPLENIC FLEXURE; DIAGNOSTIC, W/WO COLLECTION SPECIMEN BY BRUSH OR WASH;  Surgeon: Bluford Kaufmann, MD;  [facility];  Service: Gastroenterology   ??? PR COLONOSCOPY W/BIOPSY SINGLE/MULTIPLE N/A 02/12/2017    Procedure: COLONOSCOPY, FLEXIBLE, PROXIMAL TO SPLENIC FLEXURE; WITH BIOPSY, SINGLE OR MULTIPLE;  Surgeon: Rona Ravens, MD;  Location: [facility];  Service: Gastroenterology   ??? PR REMOVAL COLON/ILEOSTOMY N/A 10/11/2018    Procedure: URGENT COLECTOMY, TOTAL, ABDOMINAL, WITH PROCTECTOMY; WITH ILEOSTOMY;  Surgeon: Lady Gary, MD;  Location: MAIN OR [place];  Service: Gastrointestinal   ??? PR SIGMOIDOSCOPY FLX DX W/COLLJ SPEC BR/WA IF PFRMD N/A 06/21/2018    Procedure: SIGMOIDOSCOPY, FLEXIBLE; DIAGNOSTIC, WITH OR WITHOUT COLLECTION OF SPECIMEN(S) BY BRUSHING OR WASHING; Surgeon: Cletis Athens, MD;  [facility];  Service: Gastroenterology   ??? PR SIGMOIDOSCOPY,BIOPSY N/A 10/06/2018    Procedure: SIGMOIDOSCOPY, FLEXIBLE; WITH BIOPSY, SINGLE OR MULTIPLE;  Surgeon: Leland Her, MD;  [facility]: [facility];  Service: Gastroenterology     Family history:   Family History   Problem Relation Age of Onset   ??? Cancer Paternal Uncle  unknown, but has an ostomy   ??? Crohn's disease Paternal Aunt    ??? Cancer Maternal Uncle         colon   ??? Ulcerative colitis Neg Hx      Social history:   Social History     Socioeconomic History   ??? Marital status: Legally Separated     Spouse name: None   ??? Number of children: None   ??? Years of education: None   ??? Highest education level: None   Occupational History   ??? None   Social Needs   ??? Financial resource strain: None   ??? Food insecurity     Worry: None     Inability: None   ??? Transportation needs     Medical: None     Non-medical: None   Tobacco Use   ??? Smoking status: Never Smoker   ??? Smokeless tobacco: Never Used   Substance and Sexual Activity   ??? Alcohol use: No   ??? Drug use: No   ??? Sexual activity: None   Lifestyle   ??? Physical activity     Days per week: None     Minutes per session: None   ??? Stress: None   Relationships   ??? Social Wellsite geologist on phone: None     Gets together: None     Attends religious service: None     Active member of club or organization: None     Attends meetings of clubs or organizations: None     Relationship status: None   Other Topics Concern   ??? None   Social History Narrative   ??? None             Allergies:     Allergies   Allergen Reactions   ??? Infliximab Other (See Comments)     AUTOIMMUNE HEPATITIS     ??? Nsaids (Non-Steroidal Anti-Inflammatory Drug) Other (See Comments)     Should not take 2/2 GI condition  Other reaction(s): Other (See Comments)  Should not take due to GI condition   ??? Pineapple Swelling     Reaction:  Lip swelling   ??? Strawberry Swelling     Reaction:  Lip swelling   ??? Tolmetin Other (See Comments)     Should not take due to GI condition     ??? Tomato Swelling and Rash   ??? Amoxicillin-Pot Clavulanate Nausea And Vomiting and Nausea Only     GI intolerance   ??? Omeprazole Nausea And Vomiting             Medications:     Current Outpatient Medications   Medication Sig Dispense Refill   ??? albuterol (PROVENTIL HFA;VENTOLIN HFA) 90 mcg/actuation inhaler Inhale 2 puffs every four (4) hours as needed.      ??? albuterol 2.5 mg /3 mL (0.083 %) nebulizer solution Inhale 2.5 mg every four (4) hours as needed.      ??? calcium carbonate (TUMS) 200 mg calcium (500 mg) chewable tablet Chew 1 tablet (200 mg of elem calcium total) Three (3) times a day as needed. 150 tablet 0   ??? cholecalciferol, vitamin D3, (CHOLECALCIFEROL) 1,000 unit tablet Take 1,000 Units by mouth daily.      ??? EPINEPHrine (EPIPEN) 0.3 mg/0.3 mL injection Inject 0.3 mg into the muscle.     ??? fluticasone propionate (FLONASE) 50 mcg/actuation nasal spray 1 spray into each nostril once as needed.     ???  loratadine (CLARITIN) 10 mg tablet Take 10 mg by mouth daily.      ??? montelukast (SINGULAIR) 10 mg tablet Take 1 tablet (10 mg total) by mouth nightly. 30 tablet 11   ??? ondansetron (ZOFRAN) 4 MG tablet Take 4 mg by mouth every eight (8) hours as needed for nausea.     ??? paliperidone (INVEGA) 3 MG 24 hr tablet Take 3 mg by mouth daily at 10am.     ??? propranolol (INDERAL) 10 MG tablet TK 1 T PO TID  2   ??? scopolamine (TRANSDERM-SCOP) 1 mg over 3 days Place 1 patch (1.5 mg total) on the skin every third day. 10 patch 0   ??? sertraline (ZOLOFT) 100 MG tablet Take 75 mg by mouth daily.      ??? traZODone (DESYREL) 50 MG tablet Take 50 mg by mouth nightly.     ??? pantoprazole (PROTONIX) 40 MG tablet Take 1 tablet (40 mg total) by mouth Two (2) times a day. 60 tablet 2   ??? sulfamethoxazole-trimethoprim (BACTRIM) 400-80 mg per tablet Take 1 tablet (80 mg of trimethoprim total) by mouth daily. 36 tablet 0 ??? tofacitinib 10 mg Tab Take 1 tablet (10 mg) by mouth Two (2) times a day. 60 tablet 3     No current facility-administered medications for this visit.              Physical Exam:   There were no vitals taken for this visit.    PHONE VISIT - no or physical exam          Labs, Data & Indices:     Lab Review:   Lab Results   Component Value Date    WBC 14.8 (H) 10/13/2018    WBC 11.1 (H) 09/24/2018    RBC 2.99 (L) 10/13/2018    RBC 4.10 (L) 09/24/2018    HGB 8.3 (L) 10/13/2018    HGB 11.4 (L) 09/24/2018     Lab Results   Component Value Date    AST 14 (L) 10/07/2018    AST 11 09/24/2018    ALT 6 10/07/2018    ALT 9 09/24/2018    BUN 5 (L) 10/13/2018    BUN 5 (L) 09/24/2018    Creatinine 0.71 10/13/2018    Creatinine 1.05 09/24/2018    CO2 22.0 10/13/2018    CO2 17 (L) 09/24/2018    Albumin 3.2 (L) 10/07/2018    Calcium 8.3 (L) 10/13/2018    Calcium 9.8 09/24/2018     No results found for: TSH   ...........................................................................................................................................Marland Kitchen   Diagnosis ICD-10-CM Associated Orders   1. Other ulcerative colitis with rectal bleeding (CMS-HCC) K51.811    2. Cognitive developmental delay F81.9    3. Ileostomy present (CMS-HCC) Z93.2            Assessment & Recommendations:   Disease state: Active UC flare    Austin [name] is a 28 y.o. male with a hx notable for ADHD, Seizure disorder, Developmental delay, Bipolar disorder, hard of hearing and GERD.  He has a hx of ulcerative colitis since 2015, previously with lack of response with mesalamine, prior exposure to Remicade (good response but stopped due to suspected DILI) and Vedolizumab (secondary loss of response).  He had mod-severe colitis flare fall 2019 that did not respond to Vedolizumab re-load and prednisone.  We started [name] 10mg  BID in Jan 2020 and he had severe Mayo 3 colitis despite 3 months of high dose Xeljanz.  He progressed to  colectomy 10/11/2018 for medically refractory disease.     He will followup with Dr. Epifania Gore to discuss timing of future surgery and will see ostomy nurse today to help with ostomy leakage.  Follow up with me in GI clinic as needed after IPAA and ostomy takedown.   --------------------------------------------  Author: Zetta Bills 11/09/2018 5:03 PM    Zetta Bills, MD  Assistant Professor of Medicine  Division of Gastroenterology & Hepatology  [place] [facility]  =========================================  I spent 11 minutes on the phone with the patient. I spent an additional 7 minutes on pre- and post-visit activities.     The patient was physically located in [place] or a state in which I am permitted to provide care. The patient and/or parent/gauardian understood that s/he may incur co-pays and cost sharing, and agreed to the telemedicine visit. The visit was completed via phone and/or video, which was appropriate and reasonable under the circumstances given the patient's presentation at the time.    The patient and/or parent/guardian has been advised of the potential risks and limitations of this mode of treatment (including, but not limited to, the absence of in-person examination) and has agreed to be treated using telemedicine. The patient's/patient's family's questions regarding telemedicine have been answered.     If the phone/video visit was completed in an ambulatory setting, the patient and/or parent/guardian has also been advised to contact their provider???s office for worsening conditions, and seek emergency medical treatment and/or call 911 if the patient deems either necessary.

## 2018-11-09 NOTE — Unmapped (Signed)
[facility]  OSTOMY CLINIC VISIT NOTE     Reason for Consult:   - Ileostomy  - Pouching Issue    Problem List:   Active Problems:    * No active hospital problems. *    Assessment: Patient and his mother are seen in [facility] clinic for c/o persistent pouch leakage at the 3 o'clock position.  Intact pouch was removed to reveal a healthy-appearing stoma with minimal height and the os draining inferiorly near skin level.  After cleaning and drying the peristomal skin, I lightly crusted the pouching surface and applied a thin moldable barrier ring and a 2-piece cut-to-fit convex appliance and ostomy belt.    Stoma Type:  -  Ileostomy Stoma Location:  - RUQ (Right Upper Quadrant)     Stoma Characteristics:  - Oval  - Budded  - Minimal height Stoma Mucosal Condition and Color:  - Moist  - Red     Mucocutaneous Junction:  - Intact    Rod/Stents:  - No     Output:  - Brown  - Applesauce consistency  - Stool     Peristomal Skin Condition:   - Erythema  - Denuded  - Treated with crusting    Abdominal Contours:  - Soft  - Obese    Pouching System:  - 2 Piece  - Convex  - CTF (Cut to fit)  - Moldable barrier ring  - Ostomy belt Anticipated Wear Time of Pouching System:  - To be determined     Teaching/Instructions:  - Crusting peristomal skin irritation.  - Measuring stoma size and cutting skin barrier appropriately.  - Application/adjustment of ostomy belt.  - Application of moldable barrier ring.  - Reviewed when to contact the outpatient WOC nurse with questions/concerns.    Recommendations/Plan:   - Patient to follow up with outpatient W[facility] nurse. (as needed)    Plan of Care Discussed With:  - Patient  - Family    Ostomy Supplies:   - Ostomy pouching samples provided    Ostomy Product List:  Hollister 2-piece convex cut-to-fit skin barrier (# [id number]), Hollister 2-piece drainable pouch (# [id number]), Hollister stoma powder (# [id number]), Cavilon no-sting barrier film (# [id number]), Hollister ostomy belt (# [id number]), Coloplast Brava moldable barrier ring (thin) (# [id number]), ConvaTec adhesive remover wipes (# [id number])    [facility] Follow Up:  - as needed    Workup Time:  30 minutes     Glee Arvin, BSN, RN, [employer]

## 2018-11-11 NOTE — Unmapped (Signed)
[facility]   Telephone Call    Service:  [facility]       Reason For Call:  - Pouching problem    Assessment:   Patient's mother c/o patient's ostomy pouch continues to leak, even after switching to a convex 2-piece appliance on 08/09/2018.    Actions Taken:  -  Samples requested from [employer]ister (1-piece soft convex cut-to-fit pouch and belt # [id number])    Instructions Provided:  -  Sizing ostomy wafer  -  suggested omitting barrier ring and cutting the wafer opening to exact size/shape of patient's stoma.  Make certain that patient's ostomy belt is snug.    Follow Up:  -  Patient will contact outpatient [facility] for follow-up after receiving requested samples    Workup Time:   30 minutes     Glee Arvin, BSN, RN, [employer]

## 2018-11-12 NOTE — Unmapped (Signed)
06/05-Pt no longer taking Xeljanz,I advised Temeshia(mom) states he had the surgery ,I advised her I would dis-enroll pt,and would not receive calls from [place] any longer-CB

## 2018-11-24 MED FILL — MONTELUKAST 10 MG TABLET: ORAL | 30 days supply | Qty: 30 | Fill #0

## 2018-11-24 MED FILL — PANTOPRAZOLE 40 MG TABLET,DELAYED RELEASE: ORAL | 30 days supply | Qty: 60 | Fill #0

## 2018-11-24 MED FILL — PANTOPRAZOLE 40 MG TABLET,DELAYED RELEASE: 30 days supply | Qty: 60 | Fill #0 | Status: AC

## 2018-11-24 MED FILL — MONTELUKAST 10 MG TABLET: 30 days supply | Qty: 30 | Fill #0 | Status: AC

## 2018-12-17 ENCOUNTER — Other Ambulatory Visit: Payer: Self-pay

## 2018-12-17 DIAGNOSIS — Z20822 Contact with and (suspected) exposure to covid-19: Secondary | ICD-10-CM

## 2018-12-21 LAB — NOVEL CORONAVIRUS, NAA: SARS-CoV-2, NAA: DETECTED — AB

## 2018-12-30 MED FILL — PANTOPRAZOLE 40 MG TABLET,DELAYED RELEASE: 30 days supply | Qty: 60 | Fill #1 | Status: AC

## 2018-12-30 MED FILL — MONTELUKAST 10 MG TABLET: 30 days supply | Qty: 30 | Fill #1 | Status: AC

## 2018-12-30 MED FILL — MONTELUKAST 10 MG TABLET: ORAL | 30 days supply | Qty: 30 | Fill #1

## 2018-12-30 MED FILL — PANTOPRAZOLE 40 MG TABLET,DELAYED RELEASE: ORAL | 30 days supply | Qty: 60 | Fill #1

## 2019-01-13 ENCOUNTER — Other Ambulatory Visit: Payer: Self-pay

## 2019-01-13 DIAGNOSIS — Z20822 Contact with and (suspected) exposure to covid-19: Secondary | ICD-10-CM

## 2019-01-14 LAB — NOVEL CORONAVIRUS, NAA: SARS-CoV-2, NAA: NOT DETECTED

## 2019-02-08 DIAGNOSIS — Z9049 Acquired absence of other specified parts of digestive tract: Secondary | ICD-10-CM

## 2019-02-08 DIAGNOSIS — K668 Other specified disorders of peritoneum: Secondary | ICD-10-CM

## 2019-02-08 DIAGNOSIS — K6811 Postprocedural retroperitoneal abscess: Secondary | ICD-10-CM

## 2019-02-08 DIAGNOSIS — D62 Acute posthemorrhagic anemia: Secondary | ICD-10-CM

## 2019-02-08 DIAGNOSIS — G40909 Epilepsy, unspecified, not intractable, without status epilepticus: Secondary | ICD-10-CM

## 2019-02-08 DIAGNOSIS — F319 Bipolar disorder, unspecified: Secondary | ICD-10-CM

## 2019-02-08 DIAGNOSIS — K51919 Ulcerative colitis, unspecified with unspecified complications: Secondary | ICD-10-CM

## 2019-02-08 DIAGNOSIS — J45909 Unspecified asthma, uncomplicated: Secondary | ICD-10-CM

## 2019-02-08 DIAGNOSIS — E876 Hypokalemia: Secondary | ICD-10-CM

## 2019-02-08 DIAGNOSIS — J90 Pleural effusion, not elsewhere classified: Secondary | ICD-10-CM

## 2019-02-08 DIAGNOSIS — R6250 Unspecified lack of expected normal physiological development in childhood: Secondary | ICD-10-CM

## 2019-02-08 DIAGNOSIS — K219 Gastro-esophageal reflux disease without esophagitis: Secondary | ICD-10-CM

## 2019-02-08 DIAGNOSIS — R188 Other ascites: Secondary | ICD-10-CM

## 2019-02-08 DIAGNOSIS — K9189 Other postprocedural complications and disorders of digestive system: Secondary | ICD-10-CM

## 2019-02-08 DIAGNOSIS — H9190 Unspecified hearing loss, unspecified ear: Secondary | ICD-10-CM

## 2019-02-08 DIAGNOSIS — Z880 Allergy status to penicillin: Secondary | ICD-10-CM

## 2019-02-08 DIAGNOSIS — S36539A Laceration of unspecified part of colon, initial encounter: Secondary | ICD-10-CM

## 2019-02-08 DIAGNOSIS — K651 Peritoneal abscess: Secondary | ICD-10-CM

## 2019-02-08 DIAGNOSIS — I10 Essential (primary) hypertension: Secondary | ICD-10-CM

## 2019-02-08 DIAGNOSIS — T8130XA Disruption of wound, unspecified, initial encounter: Secondary | ICD-10-CM

## 2019-02-08 DIAGNOSIS — A419 Sepsis, unspecified organism: Secondary | ICD-10-CM

## 2019-02-09 ENCOUNTER — Ambulatory Visit
Admit: 2019-02-09 | Discharge: 2019-03-04 | Disposition: A | Discharge status: Home Health | Payer: MEDICAID | Admitting: Surgery

## 2019-02-09 ENCOUNTER — Encounter
Admit: 2019-02-09 | Discharge: 2019-03-04 | Disposition: A | Discharge status: Home Health | Payer: MEDICAID | Attending: Certified Registered" | Admitting: Surgery

## 2019-02-09 DIAGNOSIS — K51919 Ulcerative colitis, unspecified with unspecified complications: Secondary | ICD-10-CM

## 2019-02-09 DIAGNOSIS — K219 Gastro-esophageal reflux disease without esophagitis: Secondary | ICD-10-CM

## 2019-02-09 DIAGNOSIS — K651 Peritoneal abscess: Secondary | ICD-10-CM

## 2019-02-09 DIAGNOSIS — G40909 Epilepsy, unspecified, not intractable, without status epilepticus: Secondary | ICD-10-CM

## 2019-02-09 DIAGNOSIS — H9190 Unspecified hearing loss, unspecified ear: Secondary | ICD-10-CM

## 2019-02-09 DIAGNOSIS — R188 Other ascites: Secondary | ICD-10-CM

## 2019-02-09 DIAGNOSIS — K6811 Postprocedural retroperitoneal abscess: Secondary | ICD-10-CM

## 2019-02-09 DIAGNOSIS — Z880 Allergy status to penicillin: Secondary | ICD-10-CM

## 2019-02-09 DIAGNOSIS — K668 Other specified disorders of peritoneum: Secondary | ICD-10-CM

## 2019-02-09 DIAGNOSIS — T8130XA Disruption of wound, unspecified, initial encounter: Secondary | ICD-10-CM

## 2019-02-09 DIAGNOSIS — I10 Essential (primary) hypertension: Secondary | ICD-10-CM

## 2019-02-09 DIAGNOSIS — Z9049 Acquired absence of other specified parts of digestive tract: Secondary | ICD-10-CM

## 2019-02-09 DIAGNOSIS — K9189 Other postprocedural complications and disorders of digestive system: Secondary | ICD-10-CM

## 2019-02-09 DIAGNOSIS — D62 Acute posthemorrhagic anemia: Secondary | ICD-10-CM

## 2019-02-09 DIAGNOSIS — J45909 Unspecified asthma, uncomplicated: Secondary | ICD-10-CM

## 2019-02-09 DIAGNOSIS — F319 Bipolar disorder, unspecified: Secondary | ICD-10-CM

## 2019-02-09 DIAGNOSIS — E876 Hypokalemia: Secondary | ICD-10-CM

## 2019-02-09 DIAGNOSIS — R6250 Unspecified lack of expected normal physiological development in childhood: Secondary | ICD-10-CM

## 2019-02-09 DIAGNOSIS — A419 Sepsis, unspecified organism: Secondary | ICD-10-CM

## 2019-02-09 DIAGNOSIS — J90 Pleural effusion, not elsewhere classified: Secondary | ICD-10-CM

## 2019-02-09 DIAGNOSIS — S36539A Laceration of unspecified part of colon, initial encounter: Secondary | ICD-10-CM

## 2019-02-11 DIAGNOSIS — Z9049 Acquired absence of other specified parts of digestive tract: Secondary | ICD-10-CM

## 2019-02-11 DIAGNOSIS — F319 Bipolar disorder, unspecified: Secondary | ICD-10-CM

## 2019-02-11 DIAGNOSIS — K51919 Ulcerative colitis, unspecified with unspecified complications: Secondary | ICD-10-CM

## 2019-02-11 DIAGNOSIS — E876 Hypokalemia: Secondary | ICD-10-CM

## 2019-02-11 DIAGNOSIS — Z880 Allergy status to penicillin: Secondary | ICD-10-CM

## 2019-02-11 DIAGNOSIS — A419 Sepsis, unspecified organism: Secondary | ICD-10-CM

## 2019-02-11 DIAGNOSIS — R188 Other ascites: Secondary | ICD-10-CM

## 2019-02-11 DIAGNOSIS — K668 Other specified disorders of peritoneum: Secondary | ICD-10-CM

## 2019-02-11 DIAGNOSIS — R6250 Unspecified lack of expected normal physiological development in childhood: Secondary | ICD-10-CM

## 2019-02-11 DIAGNOSIS — H9190 Unspecified hearing loss, unspecified ear: Secondary | ICD-10-CM

## 2019-02-11 DIAGNOSIS — K6811 Postprocedural retroperitoneal abscess: Secondary | ICD-10-CM

## 2019-02-11 DIAGNOSIS — K219 Gastro-esophageal reflux disease without esophagitis: Secondary | ICD-10-CM

## 2019-02-11 DIAGNOSIS — I10 Essential (primary) hypertension: Secondary | ICD-10-CM

## 2019-02-11 DIAGNOSIS — J45909 Unspecified asthma, uncomplicated: Secondary | ICD-10-CM

## 2019-02-11 DIAGNOSIS — S36539A Laceration of unspecified part of colon, initial encounter: Secondary | ICD-10-CM

## 2019-02-11 DIAGNOSIS — J90 Pleural effusion, not elsewhere classified: Secondary | ICD-10-CM

## 2019-02-11 DIAGNOSIS — K651 Peritoneal abscess: Secondary | ICD-10-CM

## 2019-02-11 DIAGNOSIS — D62 Acute posthemorrhagic anemia: Secondary | ICD-10-CM

## 2019-02-11 DIAGNOSIS — K9189 Other postprocedural complications and disorders of digestive system: Secondary | ICD-10-CM

## 2019-02-11 DIAGNOSIS — T8130XA Disruption of wound, unspecified, initial encounter: Secondary | ICD-10-CM

## 2019-02-11 DIAGNOSIS — G40909 Epilepsy, unspecified, not intractable, without status epilepticus: Secondary | ICD-10-CM

## 2019-02-12 DIAGNOSIS — K219 Gastro-esophageal reflux disease without esophagitis: Secondary | ICD-10-CM

## 2019-02-12 DIAGNOSIS — G40909 Epilepsy, unspecified, not intractable, without status epilepticus: Secondary | ICD-10-CM

## 2019-02-12 DIAGNOSIS — A419 Sepsis, unspecified organism: Secondary | ICD-10-CM

## 2019-02-12 DIAGNOSIS — T8130XA Disruption of wound, unspecified, initial encounter: Secondary | ICD-10-CM

## 2019-02-12 DIAGNOSIS — K668 Other specified disorders of peritoneum: Secondary | ICD-10-CM

## 2019-02-12 DIAGNOSIS — K6811 Postprocedural retroperitoneal abscess: Secondary | ICD-10-CM

## 2019-02-12 DIAGNOSIS — R6250 Unspecified lack of expected normal physiological development in childhood: Secondary | ICD-10-CM

## 2019-02-12 DIAGNOSIS — E876 Hypokalemia: Secondary | ICD-10-CM

## 2019-02-12 DIAGNOSIS — Z9049 Acquired absence of other specified parts of digestive tract: Secondary | ICD-10-CM

## 2019-02-12 DIAGNOSIS — F319 Bipolar disorder, unspecified: Secondary | ICD-10-CM

## 2019-02-12 DIAGNOSIS — K51919 Ulcerative colitis, unspecified with unspecified complications: Secondary | ICD-10-CM

## 2019-02-12 DIAGNOSIS — K651 Peritoneal abscess: Secondary | ICD-10-CM

## 2019-02-12 DIAGNOSIS — H9190 Unspecified hearing loss, unspecified ear: Secondary | ICD-10-CM

## 2019-02-12 DIAGNOSIS — I10 Essential (primary) hypertension: Secondary | ICD-10-CM

## 2019-02-12 DIAGNOSIS — Z880 Allergy status to penicillin: Secondary | ICD-10-CM

## 2019-02-12 DIAGNOSIS — R188 Other ascites: Secondary | ICD-10-CM

## 2019-02-12 DIAGNOSIS — K9189 Other postprocedural complications and disorders of digestive system: Secondary | ICD-10-CM

## 2019-02-12 DIAGNOSIS — S36539A Laceration of unspecified part of colon, initial encounter: Secondary | ICD-10-CM

## 2019-02-12 DIAGNOSIS — D62 Acute posthemorrhagic anemia: Secondary | ICD-10-CM

## 2019-02-12 DIAGNOSIS — J45909 Unspecified asthma, uncomplicated: Secondary | ICD-10-CM

## 2019-02-12 DIAGNOSIS — J90 Pleural effusion, not elsewhere classified: Secondary | ICD-10-CM

## 2019-02-14 DIAGNOSIS — K668 Other specified disorders of peritoneum: Secondary | ICD-10-CM

## 2019-02-14 DIAGNOSIS — J45909 Unspecified asthma, uncomplicated: Secondary | ICD-10-CM

## 2019-02-14 DIAGNOSIS — K9189 Other postprocedural complications and disorders of digestive system: Secondary | ICD-10-CM

## 2019-02-14 DIAGNOSIS — T8130XA Disruption of wound, unspecified, initial encounter: Secondary | ICD-10-CM

## 2019-02-14 DIAGNOSIS — S36539A Laceration of unspecified part of colon, initial encounter: Secondary | ICD-10-CM

## 2019-02-14 DIAGNOSIS — G40909 Epilepsy, unspecified, not intractable, without status epilepticus: Secondary | ICD-10-CM

## 2019-02-14 DIAGNOSIS — D62 Acute posthemorrhagic anemia: Secondary | ICD-10-CM

## 2019-02-14 DIAGNOSIS — I10 Essential (primary) hypertension: Secondary | ICD-10-CM

## 2019-02-14 DIAGNOSIS — K51919 Ulcerative colitis, unspecified with unspecified complications: Secondary | ICD-10-CM

## 2019-02-14 DIAGNOSIS — Z880 Allergy status to penicillin: Secondary | ICD-10-CM

## 2019-02-14 DIAGNOSIS — J90 Pleural effusion, not elsewhere classified: Secondary | ICD-10-CM

## 2019-02-14 DIAGNOSIS — R6250 Unspecified lack of expected normal physiological development in childhood: Secondary | ICD-10-CM

## 2019-02-14 DIAGNOSIS — H9190 Unspecified hearing loss, unspecified ear: Secondary | ICD-10-CM

## 2019-02-14 DIAGNOSIS — K651 Peritoneal abscess: Secondary | ICD-10-CM

## 2019-02-14 DIAGNOSIS — R188 Other ascites: Secondary | ICD-10-CM

## 2019-02-14 DIAGNOSIS — A419 Sepsis, unspecified organism: Secondary | ICD-10-CM

## 2019-02-14 DIAGNOSIS — F319 Bipolar disorder, unspecified: Secondary | ICD-10-CM

## 2019-02-14 DIAGNOSIS — K219 Gastro-esophageal reflux disease without esophagitis: Secondary | ICD-10-CM

## 2019-02-14 DIAGNOSIS — E876 Hypokalemia: Secondary | ICD-10-CM

## 2019-02-14 DIAGNOSIS — Z9049 Acquired absence of other specified parts of digestive tract: Secondary | ICD-10-CM

## 2019-02-14 DIAGNOSIS — K6811 Postprocedural retroperitoneal abscess: Secondary | ICD-10-CM

## 2019-02-15 DIAGNOSIS — S36539A Laceration of unspecified part of colon, initial encounter: Secondary | ICD-10-CM

## 2019-02-15 DIAGNOSIS — T8130XA Disruption of wound, unspecified, initial encounter: Secondary | ICD-10-CM

## 2019-02-15 DIAGNOSIS — H9190 Unspecified hearing loss, unspecified ear: Secondary | ICD-10-CM

## 2019-02-15 DIAGNOSIS — Z9049 Acquired absence of other specified parts of digestive tract: Secondary | ICD-10-CM

## 2019-02-15 DIAGNOSIS — A419 Sepsis, unspecified organism: Secondary | ICD-10-CM

## 2019-02-15 DIAGNOSIS — K219 Gastro-esophageal reflux disease without esophagitis: Secondary | ICD-10-CM

## 2019-02-15 DIAGNOSIS — K6811 Postprocedural retroperitoneal abscess: Secondary | ICD-10-CM

## 2019-02-15 DIAGNOSIS — Z880 Allergy status to penicillin: Secondary | ICD-10-CM

## 2019-02-15 DIAGNOSIS — K9189 Other postprocedural complications and disorders of digestive system: Secondary | ICD-10-CM

## 2019-02-15 DIAGNOSIS — K668 Other specified disorders of peritoneum: Secondary | ICD-10-CM

## 2019-02-15 DIAGNOSIS — R6250 Unspecified lack of expected normal physiological development in childhood: Secondary | ICD-10-CM

## 2019-02-15 DIAGNOSIS — J90 Pleural effusion, not elsewhere classified: Secondary | ICD-10-CM

## 2019-02-15 DIAGNOSIS — I10 Essential (primary) hypertension: Secondary | ICD-10-CM

## 2019-02-15 DIAGNOSIS — R188 Other ascites: Secondary | ICD-10-CM

## 2019-02-15 DIAGNOSIS — G40909 Epilepsy, unspecified, not intractable, without status epilepticus: Secondary | ICD-10-CM

## 2019-02-15 DIAGNOSIS — D62 Acute posthemorrhagic anemia: Secondary | ICD-10-CM

## 2019-02-15 DIAGNOSIS — E876 Hypokalemia: Secondary | ICD-10-CM

## 2019-02-15 DIAGNOSIS — K651 Peritoneal abscess: Secondary | ICD-10-CM

## 2019-02-15 DIAGNOSIS — F319 Bipolar disorder, unspecified: Secondary | ICD-10-CM

## 2019-02-15 DIAGNOSIS — J45909 Unspecified asthma, uncomplicated: Secondary | ICD-10-CM

## 2019-02-15 DIAGNOSIS — K51919 Ulcerative colitis, unspecified with unspecified complications: Secondary | ICD-10-CM

## 2019-02-16 DIAGNOSIS — T8143XA Infection following a procedure, organ and space surgical site, initial encounter: Secondary | ICD-10-CM | POA: Insufficient documentation

## 2019-02-18 DIAGNOSIS — J90 Pleural effusion, not elsewhere classified: Secondary | ICD-10-CM

## 2019-02-18 DIAGNOSIS — K668 Other specified disorders of peritoneum: Secondary | ICD-10-CM

## 2019-02-18 DIAGNOSIS — K219 Gastro-esophageal reflux disease without esophagitis: Secondary | ICD-10-CM

## 2019-02-18 DIAGNOSIS — R188 Other ascites: Secondary | ICD-10-CM

## 2019-02-18 DIAGNOSIS — A419 Sepsis, unspecified organism: Secondary | ICD-10-CM

## 2019-02-18 DIAGNOSIS — Z880 Allergy status to penicillin: Secondary | ICD-10-CM

## 2019-02-18 DIAGNOSIS — Z9049 Acquired absence of other specified parts of digestive tract: Secondary | ICD-10-CM

## 2019-02-18 DIAGNOSIS — D62 Acute posthemorrhagic anemia: Secondary | ICD-10-CM

## 2019-02-18 DIAGNOSIS — K6811 Postprocedural retroperitoneal abscess: Secondary | ICD-10-CM

## 2019-02-18 DIAGNOSIS — R6250 Unspecified lack of expected normal physiological development in childhood: Secondary | ICD-10-CM

## 2019-02-18 DIAGNOSIS — K651 Peritoneal abscess: Secondary | ICD-10-CM

## 2019-02-18 DIAGNOSIS — T8130XA Disruption of wound, unspecified, initial encounter: Secondary | ICD-10-CM

## 2019-02-18 DIAGNOSIS — J45909 Unspecified asthma, uncomplicated: Secondary | ICD-10-CM

## 2019-02-18 DIAGNOSIS — K51919 Ulcerative colitis, unspecified with unspecified complications: Secondary | ICD-10-CM

## 2019-02-18 DIAGNOSIS — S36539A Laceration of unspecified part of colon, initial encounter: Secondary | ICD-10-CM

## 2019-02-18 DIAGNOSIS — G40909 Epilepsy, unspecified, not intractable, without status epilepticus: Secondary | ICD-10-CM

## 2019-02-18 DIAGNOSIS — H9190 Unspecified hearing loss, unspecified ear: Secondary | ICD-10-CM

## 2019-02-18 DIAGNOSIS — K9189 Other postprocedural complications and disorders of digestive system: Secondary | ICD-10-CM

## 2019-02-18 DIAGNOSIS — E876 Hypokalemia: Secondary | ICD-10-CM

## 2019-02-18 DIAGNOSIS — I10 Essential (primary) hypertension: Secondary | ICD-10-CM

## 2019-02-18 DIAGNOSIS — F319 Bipolar disorder, unspecified: Secondary | ICD-10-CM

## 2019-02-19 DIAGNOSIS — K651 Peritoneal abscess: Secondary | ICD-10-CM

## 2019-02-19 DIAGNOSIS — K9189 Other postprocedural complications and disorders of digestive system: Secondary | ICD-10-CM

## 2019-02-19 DIAGNOSIS — T8130XA Disruption of wound, unspecified, initial encounter: Secondary | ICD-10-CM

## 2019-02-19 DIAGNOSIS — Z9049 Acquired absence of other specified parts of digestive tract: Secondary | ICD-10-CM

## 2019-02-19 DIAGNOSIS — D62 Acute posthemorrhagic anemia: Secondary | ICD-10-CM

## 2019-02-19 DIAGNOSIS — E876 Hypokalemia: Secondary | ICD-10-CM

## 2019-02-19 DIAGNOSIS — Z880 Allergy status to penicillin: Secondary | ICD-10-CM

## 2019-02-19 DIAGNOSIS — K668 Other specified disorders of peritoneum: Secondary | ICD-10-CM

## 2019-02-19 DIAGNOSIS — K219 Gastro-esophageal reflux disease without esophagitis: Secondary | ICD-10-CM

## 2019-02-19 DIAGNOSIS — J90 Pleural effusion, not elsewhere classified: Secondary | ICD-10-CM

## 2019-02-19 DIAGNOSIS — I10 Essential (primary) hypertension: Secondary | ICD-10-CM

## 2019-02-19 DIAGNOSIS — S36539A Laceration of unspecified part of colon, initial encounter: Secondary | ICD-10-CM

## 2019-02-19 DIAGNOSIS — A419 Sepsis, unspecified organism: Secondary | ICD-10-CM

## 2019-02-19 DIAGNOSIS — K51919 Ulcerative colitis, unspecified with unspecified complications: Secondary | ICD-10-CM

## 2019-02-19 DIAGNOSIS — J45909 Unspecified asthma, uncomplicated: Secondary | ICD-10-CM

## 2019-02-19 DIAGNOSIS — G40909 Epilepsy, unspecified, not intractable, without status epilepticus: Secondary | ICD-10-CM

## 2019-02-19 DIAGNOSIS — R188 Other ascites: Secondary | ICD-10-CM

## 2019-02-19 DIAGNOSIS — F319 Bipolar disorder, unspecified: Secondary | ICD-10-CM

## 2019-02-19 DIAGNOSIS — R6250 Unspecified lack of expected normal physiological development in childhood: Secondary | ICD-10-CM

## 2019-02-19 DIAGNOSIS — K6811 Postprocedural retroperitoneal abscess: Secondary | ICD-10-CM

## 2019-02-19 DIAGNOSIS — H9190 Unspecified hearing loss, unspecified ear: Secondary | ICD-10-CM

## 2019-02-20 DIAGNOSIS — K9189 Other postprocedural complications and disorders of digestive system: Secondary | ICD-10-CM

## 2019-02-20 DIAGNOSIS — K219 Gastro-esophageal reflux disease without esophagitis: Secondary | ICD-10-CM

## 2019-02-20 DIAGNOSIS — H9190 Unspecified hearing loss, unspecified ear: Secondary | ICD-10-CM

## 2019-02-20 DIAGNOSIS — A419 Sepsis, unspecified organism: Secondary | ICD-10-CM

## 2019-02-20 DIAGNOSIS — K668 Other specified disorders of peritoneum: Secondary | ICD-10-CM

## 2019-02-20 DIAGNOSIS — T8130XA Disruption of wound, unspecified, initial encounter: Secondary | ICD-10-CM

## 2019-02-20 DIAGNOSIS — Z880 Allergy status to penicillin: Secondary | ICD-10-CM

## 2019-02-20 DIAGNOSIS — D62 Acute posthemorrhagic anemia: Secondary | ICD-10-CM

## 2019-02-20 DIAGNOSIS — Z9049 Acquired absence of other specified parts of digestive tract: Secondary | ICD-10-CM

## 2019-02-20 DIAGNOSIS — R188 Other ascites: Secondary | ICD-10-CM

## 2019-02-20 DIAGNOSIS — R6250 Unspecified lack of expected normal physiological development in childhood: Secondary | ICD-10-CM

## 2019-02-20 DIAGNOSIS — K6811 Postprocedural retroperitoneal abscess: Secondary | ICD-10-CM

## 2019-02-20 DIAGNOSIS — G40909 Epilepsy, unspecified, not intractable, without status epilepticus: Secondary | ICD-10-CM

## 2019-02-20 DIAGNOSIS — S36539A Laceration of unspecified part of colon, initial encounter: Secondary | ICD-10-CM

## 2019-02-20 DIAGNOSIS — J90 Pleural effusion, not elsewhere classified: Secondary | ICD-10-CM

## 2019-02-20 DIAGNOSIS — K651 Peritoneal abscess: Secondary | ICD-10-CM

## 2019-02-20 DIAGNOSIS — E876 Hypokalemia: Secondary | ICD-10-CM

## 2019-02-20 DIAGNOSIS — I10 Essential (primary) hypertension: Secondary | ICD-10-CM

## 2019-02-20 DIAGNOSIS — J45909 Unspecified asthma, uncomplicated: Secondary | ICD-10-CM

## 2019-02-20 DIAGNOSIS — F319 Bipolar disorder, unspecified: Secondary | ICD-10-CM

## 2019-02-20 DIAGNOSIS — K51919 Ulcerative colitis, unspecified with unspecified complications: Secondary | ICD-10-CM

## 2019-02-21 DIAGNOSIS — G40909 Epilepsy, unspecified, not intractable, without status epilepticus: Secondary | ICD-10-CM

## 2019-02-21 DIAGNOSIS — J45909 Unspecified asthma, uncomplicated: Secondary | ICD-10-CM

## 2019-02-21 DIAGNOSIS — K9189 Other postprocedural complications and disorders of digestive system: Secondary | ICD-10-CM

## 2019-02-21 DIAGNOSIS — R6250 Unspecified lack of expected normal physiological development in childhood: Secondary | ICD-10-CM

## 2019-02-21 DIAGNOSIS — E876 Hypokalemia: Secondary | ICD-10-CM

## 2019-02-21 DIAGNOSIS — A419 Sepsis, unspecified organism: Secondary | ICD-10-CM

## 2019-02-21 DIAGNOSIS — Z9049 Acquired absence of other specified parts of digestive tract: Secondary | ICD-10-CM

## 2019-02-21 DIAGNOSIS — J90 Pleural effusion, not elsewhere classified: Secondary | ICD-10-CM

## 2019-02-21 DIAGNOSIS — K668 Other specified disorders of peritoneum: Secondary | ICD-10-CM

## 2019-02-21 DIAGNOSIS — Z880 Allergy status to penicillin: Secondary | ICD-10-CM

## 2019-02-21 DIAGNOSIS — K6811 Postprocedural retroperitoneal abscess: Secondary | ICD-10-CM

## 2019-02-21 DIAGNOSIS — S36539A Laceration of unspecified part of colon, initial encounter: Secondary | ICD-10-CM

## 2019-02-21 DIAGNOSIS — F319 Bipolar disorder, unspecified: Secondary | ICD-10-CM

## 2019-02-21 DIAGNOSIS — R188 Other ascites: Secondary | ICD-10-CM

## 2019-02-21 DIAGNOSIS — K51919 Ulcerative colitis, unspecified with unspecified complications: Secondary | ICD-10-CM

## 2019-02-21 DIAGNOSIS — K651 Peritoneal abscess: Secondary | ICD-10-CM

## 2019-02-21 DIAGNOSIS — K219 Gastro-esophageal reflux disease without esophagitis: Secondary | ICD-10-CM

## 2019-02-21 DIAGNOSIS — I10 Essential (primary) hypertension: Secondary | ICD-10-CM

## 2019-02-21 DIAGNOSIS — D62 Acute posthemorrhagic anemia: Secondary | ICD-10-CM

## 2019-02-21 DIAGNOSIS — H9190 Unspecified hearing loss, unspecified ear: Secondary | ICD-10-CM

## 2019-02-21 DIAGNOSIS — T8130XA Disruption of wound, unspecified, initial encounter: Secondary | ICD-10-CM

## 2019-02-27 DIAGNOSIS — K51919 Ulcerative colitis, unspecified with unspecified complications: Secondary | ICD-10-CM

## 2019-02-27 DIAGNOSIS — Z9049 Acquired absence of other specified parts of digestive tract: Secondary | ICD-10-CM

## 2019-02-27 DIAGNOSIS — F319 Bipolar disorder, unspecified: Secondary | ICD-10-CM

## 2019-02-27 DIAGNOSIS — K9189 Other postprocedural complications and disorders of digestive system: Secondary | ICD-10-CM

## 2019-02-27 DIAGNOSIS — J90 Pleural effusion, not elsewhere classified: Secondary | ICD-10-CM

## 2019-02-27 DIAGNOSIS — K219 Gastro-esophageal reflux disease without esophagitis: Secondary | ICD-10-CM

## 2019-02-27 DIAGNOSIS — Z880 Allergy status to penicillin: Secondary | ICD-10-CM

## 2019-02-27 DIAGNOSIS — A419 Sepsis, unspecified organism: Secondary | ICD-10-CM

## 2019-02-27 DIAGNOSIS — K668 Other specified disorders of peritoneum: Secondary | ICD-10-CM

## 2019-02-27 DIAGNOSIS — J45909 Unspecified asthma, uncomplicated: Secondary | ICD-10-CM

## 2019-02-27 DIAGNOSIS — T8130XA Disruption of wound, unspecified, initial encounter: Secondary | ICD-10-CM

## 2019-02-27 DIAGNOSIS — D62 Acute posthemorrhagic anemia: Secondary | ICD-10-CM

## 2019-02-27 DIAGNOSIS — G40909 Epilepsy, unspecified, not intractable, without status epilepticus: Secondary | ICD-10-CM

## 2019-02-27 DIAGNOSIS — K651 Peritoneal abscess: Secondary | ICD-10-CM

## 2019-02-27 DIAGNOSIS — R188 Other ascites: Secondary | ICD-10-CM

## 2019-02-27 DIAGNOSIS — I10 Essential (primary) hypertension: Secondary | ICD-10-CM

## 2019-02-27 DIAGNOSIS — S36539A Laceration of unspecified part of colon, initial encounter: Secondary | ICD-10-CM

## 2019-02-27 DIAGNOSIS — H9190 Unspecified hearing loss, unspecified ear: Secondary | ICD-10-CM

## 2019-02-27 DIAGNOSIS — K6811 Postprocedural retroperitoneal abscess: Secondary | ICD-10-CM

## 2019-02-27 DIAGNOSIS — E876 Hypokalemia: Secondary | ICD-10-CM

## 2019-02-27 DIAGNOSIS — R6250 Unspecified lack of expected normal physiological development in childhood: Secondary | ICD-10-CM

## 2019-03-01 MED FILL — MONTELUKAST 10 MG TABLET: ORAL | 30 days supply | Qty: 30 | Fill #2

## 2019-03-01 MED FILL — MONTELUKAST 10 MG TABLET: 30 days supply | Qty: 30 | Fill #2 | Status: AC

## 2019-03-02 MED ORDER — FLUCONAZOLE 400 MG/200 ML IN SOD. CHLORIDE(ISO) INTRAVENOUS PIGGYBACK
0 refills | 0 days | Status: CP
Start: 2019-03-02 — End: ?

## 2019-03-02 MED ORDER — PIPERACILLIN-TAZOBACTAM 4.5 GRAM/100 ML DEXTROSE(ISO-OSM) IV PIGGYBACK
0 refills | 0 days | Status: CP
Start: 2019-03-02 — End: ?

## 2019-03-04 MED ORDER — SODIUM CHLORIDE 0.9 % (FLUSH) INJECTION SYRINGE
1 refills | 0.00000 days | Status: CP
Start: 2019-03-04 — End: 2019-03-04
  Filled 2019-03-04: qty 600, 30d supply, fill #0

## 2019-03-04 MED ORDER — SODIUM CHLORIDE 0.9 % (FLUSH) INJECTION SYRINGE: mL | 1 refills | 0 days | Status: AC

## 2019-03-04 MED ORDER — OXYCODONE 5 MG TABLET
ORAL_TABLET | ORAL | 0 refills | 2 days | Status: CP | PRN
Start: 2019-03-04 — End: 2019-03-08

## 2019-03-04 MED ORDER — ONDANSETRON HCL 4 MG TABLET
ORAL_TABLET | Freq: Three times a day (TID) | ORAL | 0 refills | 5.00000 days | Status: CP | PRN
Start: 2019-03-04 — End: 2019-04-03

## 2019-03-04 MED FILL — SODIUM CHLORIDE 0.9 % (FLUSH) INJECTION SYRINGE: 30 days supply | Qty: 600 | Fill #0 | Status: AC

## 2019-03-08 MED ORDER — OXYCODONE 5 MG TABLET
ORAL_TABLET | Freq: Four times a day (QID) | ORAL | 0 refills | 4.00000 days | Status: CP | PRN
Start: 2019-03-08 — End: 2019-03-13

## 2019-03-15 ENCOUNTER — Ambulatory Visit: Admit: 2019-03-15 | Discharge: 2019-03-15 | Payer: MEDICAID | Attending: Surgery | Primary: Surgery

## 2019-03-15 ENCOUNTER — Ambulatory Visit: Admit: 2019-03-15 | Discharge: 2019-03-15 | Payer: MEDICAID

## 2019-03-15 DIAGNOSIS — Z932 Ileostomy status: Secondary | ICD-10-CM

## 2019-03-15 DIAGNOSIS — Z4803 Encounter for change or removal of drains: Secondary | ICD-10-CM

## 2019-03-15 DIAGNOSIS — T8143XA Infection following a procedure, organ and space surgical site, initial encounter: Secondary | ICD-10-CM

## 2019-03-15 DIAGNOSIS — K5190 Ulcerative colitis, unspecified, without complications: Secondary | ICD-10-CM

## 2019-03-15 DIAGNOSIS — Z79899 Other long term (current) drug therapy: Secondary | ICD-10-CM

## 2019-03-15 DIAGNOSIS — R188 Other ascites: Secondary | ICD-10-CM

## 2019-03-15 DIAGNOSIS — K51919 Ulcerative colitis, unspecified with unspecified complications: Secondary | ICD-10-CM

## 2019-03-15 DIAGNOSIS — Z9049 Acquired absence of other specified parts of digestive tract: Secondary | ICD-10-CM

## 2019-03-25 ENCOUNTER — Ambulatory Visit: Admit: 2019-03-25 | Discharge: 2019-03-26 | Payer: MEDICAID | Attending: Surgery | Primary: Surgery

## 2019-04-01 MED FILL — MONTELUKAST 10 MG TABLET: 30 days supply | Qty: 30 | Fill #3 | Status: AC

## 2019-04-01 MED FILL — MONTELUKAST 10 MG TABLET: ORAL | 30 days supply | Qty: 30 | Fill #3

## 2019-05-10 ENCOUNTER — Ambulatory Visit: Admit: 2019-05-10 | Discharge: 2019-05-11 | Payer: MEDICAID | Attending: Surgery | Primary: Surgery

## 2019-05-10 MED ORDER — CARRINGTON MOIST BARRIER-ZINC 10 %-78 % TOPICAL CREAM
3 refills | 0 days | Status: CP
Start: 2019-05-10 — End: ?

## 2019-05-10 MED ORDER — DICYCLOMINE 10 MG CAPSULE
ORAL_CAPSULE | Freq: Four times a day (QID) | ORAL | 11 refills | 30.00000 days | Status: CP
Start: 2019-05-10 — End: 2020-05-09

## 2019-05-10 MED ORDER — LOPERAMIDE 2 MG TABLET
ORAL_TABLET | Freq: Four times a day (QID) | ORAL | 11 refills | 8 days | Status: CP
Start: 2019-05-10 — End: ?

## 2019-05-24 ENCOUNTER — Ambulatory Visit: Admit: 2019-05-24 | Discharge: 2019-05-25 | Payer: MEDICAID | Attending: Surgery | Primary: Surgery

## 2019-07-19 ENCOUNTER — Ambulatory Visit: Admit: 2019-07-19 | Discharge: 2019-07-20 | Payer: MEDICAID | Attending: Surgery | Primary: Surgery

## 2019-07-19 DIAGNOSIS — R197 Diarrhea, unspecified: Principal | ICD-10-CM

## 2019-07-19 MED ORDER — CIPROFLOXACIN 500 MG TABLET
ORAL_TABLET | Freq: Two times a day (BID) | ORAL | 0 refills | 14.00000 days | Status: CP
Start: 2019-07-19 — End: 2019-08-02

## 2020-04-04 ENCOUNTER — Ambulatory Visit (INDEPENDENT_AMBULATORY_CARE_PROVIDER_SITE_OTHER): Payer: Medicaid Other | Admitting: Family Medicine

## 2020-04-04 ENCOUNTER — Other Ambulatory Visit: Payer: Self-pay

## 2020-04-04 ENCOUNTER — Encounter: Payer: Self-pay | Admitting: Family Medicine

## 2020-04-04 VITALS — BP 120/76 | HR 82 | Temp 97.8°F | Ht 67.5 in | Wt 273.0 lb

## 2020-04-04 DIAGNOSIS — Z6841 Body Mass Index (BMI) 40.0 and over, adult: Secondary | ICD-10-CM

## 2020-04-04 DIAGNOSIS — Z23 Encounter for immunization: Secondary | ICD-10-CM

## 2020-04-04 DIAGNOSIS — K921 Melena: Secondary | ICD-10-CM | POA: Diagnosis not present

## 2020-04-04 DIAGNOSIS — G43909 Migraine, unspecified, not intractable, without status migrainosus: Secondary | ICD-10-CM | POA: Insufficient documentation

## 2020-04-04 DIAGNOSIS — K51011 Ulcerative (chronic) pancolitis with rectal bleeding: Secondary | ICD-10-CM

## 2020-04-04 DIAGNOSIS — G43809 Other migraine, not intractable, without status migrainosus: Secondary | ICD-10-CM | POA: Diagnosis not present

## 2020-04-04 MED ORDER — KETOROLAC TROMETHAMINE 60 MG/2ML IM SOLN
60.0000 mg | Freq: Once | INTRAMUSCULAR | Status: AC
  Administered 2020-04-04: 60 mg via INTRAMUSCULAR

## 2020-04-04 NOTE — Patient Instructions (Signed)
  HAPPY FALL!  I appreciate the opportunity to provide you with care for your health and wellness. Today we discussed: established care   Follow up: 3 months for migraine   Labs at [place] today: GI, possible need for neurology for migraine   Continue all medications at this time.  Pain injection today for migraine relief  Flu shot today as well-tylenol if arm is sore  It was nice to meet you both today.   Please continue to practice social distancing to keep you, your family, and our community safe.  If you must go out, please wear a mask and practice good handwashing.  It was a pleasure to see you and I look forward to continuing to work together on your health and well-being. Please do not hesitate to call the office if you need care or have questions about your care.  Have a wonderful day and week. With Gratitude, Cherly Beach, DNP, AGNP-BC

## 2020-04-04 NOTE — Assessment & Plan Note (Signed)
Obesity is link to HTN, Depression   MAXIMILLIAN HABIBI is educated about the importance of exercise daily to help with weight management. A minumum of 30 minutes daily is recommended. Additionally, importance of healthy food choices  with portion control discussed. .  Wt Readings from Last 3 Encounters:  04/04/20 273 lb (123.8 kg)  02/10/18 262 lb 9 oz (119.1 kg)  01/26/18 262 lb 9 oz (119.1 kg)

## 2020-04-04 NOTE — Assessment & Plan Note (Signed)

## 2020-04-04 NOTE — Progress Notes (Signed)
Subjective:  Patient ID: Wayne Avila, male    DOB: 1991/05/12  Age: 29 y.o. MRN: [id number]  CC:  Chief Complaint  Patient presents with  . New Patient (Initial Visit)    here to establish. migraine headaches, worsening here recent. also, has a jpouch but has been having loosing bloody stools intermitently for awhile now      HPI  HPI  Wayne Avila is a 29 year old male patient who presents today to establish care.  Mother is here to help provide history.  History as documented in the system currently includes but is not limited to bipolar 1 disorder, ADHD, asthma, anxiety, depression, autoimmune hepatitis, C. difficile, mental development delay, hard of hearing, GERD, remote history of seizures not on any medication, ulcerative colitis with recent J-pouch formation in 2020 at [facility].  Overall biggest complaints today are ongoing headaches and migraines which she was told to take Tylenol for and there not relieved by this.  Reports migraines have been off and on for longer than a year.  Unsure of onset and triggers.  Nausea and vomiting at times.  Light and sound sensitivity.  Unsure of aura.  Frontal area is predominant location of migraine and headache.  Denies having any runny nose.  Reports blood pressure can be elevated at home with migraine is present.  Reports having a migraine today in the office.  And would like injection pain medicine.  Unable to take NSAIDs secondary to ulcerative colitis history.  Additional things at include of concern are increased bloody bowel movements.  Mother has not seen them.  He reports that they are black and tarry and a little bit mushy at times.  Has a J-pouch from [facility] please see notes.  Referral to Dr. Works office as Dr. [name] has left the area.  Denies having any cough, shortness of breath, chest pain, dizziness or blurred vision at this time.  We will get updated labs.  Today patient denies signs and symptoms of COVID 19 infection  including fever, chills, cough, shortness of breath, and headache. Past Medical, Surgical, Social History, Allergies, and Medications have been Reviewed.   Past Medical History:  Diagnosis Date  . ADHD (attention deficit hyperactivity disorder)   . Anxiety   . Asthma   . Autoimmune hepatitis ([place])   . Bipolar 1 disorder ([place])   . Bipolar 1 disorder, manic, moderate ([place]) 11/09/2014  . C. difficile colitis 03/22/2016  . Clostridium difficile colitis   . Depression   . GERD (gastroesophageal reflux disease)   . HOH (hard of hearing)   . Mental developmental delay   . Seizures ([place])   . Ulcerative colitis ([place])     Current Meds  Medication Sig  . albuterol (PROVENTIL HFA) 108 (90 Base) MCG/ACT inhaler Inhale into the lungs.  Marland Kitchen albuterol (PROVENTIL) (2.5 MG/3ML) 0.083% nebulizer solution Inhale 3 mLs (2.5 mg total) into the lungs every 6 (six) hours as needed for wheezing or shortness of breath.  . cholecalciferol (VITAMIN D) 1000 units tablet Take 1 tablet (1,000 Units total) by mouth daily. START AFTER VITAMIN D 50,000 U TABLETS ARE COMPLETE  . EPINEPHrine (EPIPEN 2-PAK) 0.3 mg/0.3 mL IJ SOAJ injection Inject 0.3 mg into the muscle once as needed (for severe allergic reaction).  . fluticasone (FLONASE) 50 MCG/ACT nasal spray Place 1 spray into both nostrils daily.  Marland Kitchen loratadine (CLARITIN) 10 MG tablet Take 10 mg by mouth.  . montelukast (SINGULAIR) 10 MG tablet Take 10  mg by mouth.  . paliperidone (INVEGA) 6 MG 24 hr tablet Take 6 mg by mouth daily.  . propranolol (INDERAL) 10 MG tablet Take 1 tablet by mouth 3 (three) times daily.  Marland Kitchen Respiratory Therapy Supplies (NEBULIZER COMPRESSOR) KIT And supplies  . sertraline (ZOLOFT) 50 MG tablet Take 100 mg by mouth daily.   . traZODone (DESYREL) 50 MG tablet Take 100 mg by mouth at bedtime.     ROS:  Review of Systems  Constitutional: Negative.   HENT: Negative.   Eyes: Negative.   Respiratory: Negative.   Cardiovascular: Negative.     Gastrointestinal: Positive for blood in stool.  Genitourinary: Negative.   Musculoskeletal: Negative.   Skin: Negative.   Neurological: Positive for headaches.  Endo/Heme/Allergies: Negative.   Psychiatric/Behavioral: Negative.      Objective:   Today's Vitals: BP 120/76 (BP Location: Right Arm, Patient Position: Sitting, Cuff Size: Large)   Pulse 82   Temp 97.8 F (36.6 C) (Tympanic)   Ht 5' 7.5" (1.715 m)   Wt 273 lb (123.8 kg)   SpO2 98%   BMI 42.13 kg/m  Vitals with BMI 04/04/2020 03/08/2018 03/08/2018  Height 5' 7.5" - -  Weight 273 lbs - -  BMI 80.1 - -  Systolic [phone]  Diastolic 76 76 84  Pulse 82 78 88  Some encounter information is confidential and restricted. Go to Review Flowsheets activity to see all data.     Physical Exam Vitals and nursing note reviewed.  Constitutional:      Appearance: Normal appearance. He is well-developed and well-groomed. He is morbidly obese.  HENT:     Head: Normocephalic and atraumatic.     Right Ear: External ear normal.     Left Ear: External ear normal.     Mouth/Throat:     Comments: Mask in place  Eyes:     General:        Right eye: No discharge.        Left eye: No discharge.     Conjunctiva/sclera: Conjunctivae normal.  Cardiovascular:     Rate and Rhythm: Normal rate and regular rhythm.     Pulses: Normal pulses.     Heart sounds: Normal heart sounds.  Pulmonary:     Effort: Pulmonary effort is normal.     Breath sounds: Normal breath sounds.  Musculoskeletal:        General: Normal range of motion.     Cervical back: Normal range of motion and neck supple.  Skin:    General: Skin is warm.  Neurological:     General: No focal deficit present.     Mental Status: He is alert and oriented to person, place, and time.  Psychiatric:        Attention and Perception: Attention normal.        Mood and Affect: Mood normal.        Speech: Speech normal.        Behavior: Behavior normal. Behavior is  cooperative.        Thought Content: Thought content normal.        Cognition and Memory: Cognition normal.        Judgment: Judgment normal.     Comments: Head down in hands most of visit Eyes closed most of visit       Assessment   1. Morbid obesity with body mass index (BMI) of 40.0 to 44.9 in adult [facility])   2. Ulcerative pancolitis with rectal bleeding (  HCC)   3. Bloody stools   4. Other migraine without status migrainosus, not intractable   5. Need for immunization against influenza     Tests ordered Orders Placed This Encounter  Procedures  . Flu Vaccine QUAD 36+ mos IM  . CBC  . Comprehensive metabolic panel  . Hemoglobin A1c  . Ambulatory referral to Gastroenterology  . Ambulatory referral to Neurology     Plan: Please see assessment and plan per problem list above.   Meds ordered this encounter  Medications  . ketorolac (TORADOL) injection 60 mg    Patient to follow-up in 3 months.  Note: This dictation was prepared with Dragon dictation along with smaller phrase technology. Similar sounding words can be transcribed inadequately or may not be corrected upon review. Any transcriptional errors that result from this process are unintentional.      Perlie Mayo, NP

## 2020-04-04 NOTE — Assessment & Plan Note (Signed)
Injection today to help pain Given stomach hx would benefit from referral to neurology for work up. Placed today

## 2020-04-04 NOTE — Assessment & Plan Note (Signed)
Referral to GI

## 2020-04-04 NOTE — Assessment & Plan Note (Signed)
J pouch in place-see notes for details. Referral to GI today due to bloody stools

## 2020-04-05 ENCOUNTER — Encounter: Payer: Self-pay | Admitting: Neurology

## 2020-04-05 LAB — CBC
Hematocrit: 40.6 % (ref 37.5–51.0)
Hemoglobin: 13.7 g/dL (ref 13.0–17.7)
MCH: 28.2 pg (ref 26.6–33.0)
MCHC: 33.7 g/dL (ref 31.5–35.7)
MCV: 84 fL (ref 79–97)
Platelets: 238 10*3/uL (ref 150–450)
RBC: 4.86 x10E6/uL (ref 4.14–5.80)
RDW: 14.7 % (ref 11.6–15.4)
WBC: 7.1 10*3/uL (ref 3.4–10.8)

## 2020-04-05 LAB — COMPREHENSIVE METABOLIC PANEL
ALT: 23 IU/L (ref 0–44)
AST: 26 IU/L (ref 0–40)
Albumin/Globulin Ratio: 1.7 (ref 1.2–2.2)
Albumin: 4.3 g/dL (ref 4.1–5.2)
Alkaline Phosphatase: 95 IU/L (ref 44–121)
BUN/Creatinine Ratio: 7 — ABNORMAL LOW (ref 9–20)
BUN: 7 mg/dL (ref 6–20)
Bilirubin Total: 0.3 mg/dL (ref 0.0–1.2)
CO2: 19 mmol/L — ABNORMAL LOW (ref 20–29)
Calcium: 9.5 mg/dL (ref 8.7–10.2)
Chloride: 109 mmol/L — ABNORMAL HIGH (ref 96–106)
Creatinine, Ser: 0.97 mg/dL (ref 0.76–1.27)
GFR calc Af Amer: 123 mL/min/{1.73_m2} (ref >59)
GFR calc non Af Amer: 107 mL/min/{1.73_m2} (ref >59)
Globulin, Total: 2.6 g/dL (ref 1.5–4.5)
Glucose: 94 mg/dL (ref 65–99)
Potassium: 4.3 mmol/L (ref 3.5–5.2)
Sodium: 141 mmol/L (ref 134–144)
Total Protein: 6.9 g/dL (ref 6.0–8.5)

## 2020-04-05 LAB — HEMOGLOBIN A1C
Est. average glucose Bld gHb Est-mCnc: 120 mg/dL
Hgb A1c MFr Bld: 5.8 % — ABNORMAL HIGH (ref 4.8–5.6)

## 2020-06-07 ENCOUNTER — Encounter: Payer: Self-pay | Admitting: Family Medicine

## 2020-06-07 NOTE — Telephone Encounter (Signed)
Pt made an appt with Jarrett Soho since this has not been filled by her before

## 2020-06-12 ENCOUNTER — Telehealth (INDEPENDENT_AMBULATORY_CARE_PROVIDER_SITE_OTHER): Payer: Medicaid Other | Admitting: Family Medicine

## 2020-06-12 ENCOUNTER — Other Ambulatory Visit: Payer: Self-pay

## 2020-06-12 ENCOUNTER — Encounter: Payer: Self-pay | Admitting: Family Medicine

## 2020-06-12 DIAGNOSIS — F901 Attention-deficit hyperactivity disorder, predominantly hyperactive type: Secondary | ICD-10-CM

## 2020-06-12 DIAGNOSIS — Z9109 Other allergy status, other than to drugs and biological substances: Secondary | ICD-10-CM

## 2020-06-12 DIAGNOSIS — F319 Bipolar disorder, unspecified: Secondary | ICD-10-CM

## 2020-06-12 MED ORDER — LORATADINE 10 MG PO TABS: 10.0000 mg | ORAL_TABLET | Freq: Every day | ORAL | 1 refills | Status: DC

## 2020-06-12 MED ORDER — TRAZODONE HCL 150 MG PO TABS: 150.0000 mg | ORAL_TABLET | Freq: Every day | ORAL | 1 refills | Status: DC

## 2020-06-12 MED ORDER — MONTELUKAST SODIUM 10 MG PO TABS: 10.0000 mg | ORAL_TABLET | Freq: Every day | ORAL | 1 refills | Status: DC

## 2020-06-12 NOTE — Patient Instructions (Signed)
  I appreciate the opportunity to provide you with care for your health and wellness.  Follow up: as scheduled -07/05/2020   No labs  Referrals today- Urgent Psychiatry for treatment and eval as needed  Increased dose of trazodone to help with sleep.   Please continue to practice social distancing to keep you, your family, and our community safe.  If you must go out, please wear a mask and practice good handwashing.  It was a pleasure to see you and I look forward to continuing to work together on your health and well-being. Please do not hesitate to call the office if you need care or have questions about your care.  Have a wonderful day. With Gratitude, Cherly Beach, DNP, AGNP-BC

## 2020-06-12 NOTE — Assessment & Plan Note (Addendum)
Referral to psychiatry secondary to several medications he has been on I am not sure what would be the best treatment for his ADHD.  I encouraged her to continue all medications as is if he needs refills of anything before his appointment I am happy to cover until he can be seen by psychiatry.  I wonder if clonidine low-dose would be of benefit if his blood pressure is not well controlled at this visit this could be an option especially to help with sleep at night.

## 2020-06-12 NOTE — Addendum Note (Signed)
Addended by: Lonn Georgia on: 06/12/2020 01:46 PM   Modules accepted: Orders

## 2020-06-12 NOTE — Progress Notes (Signed)
Virtual Visit via Telephone Note   This visit type was conducted due to national recommendations for restrictions regarding the COVID-19 Pandemic (e.g. social distancing) in an effort to limit this patient's exposure and mitigate transmission in our community.  Due to his co-morbid illnesses, this patient is at least at moderate risk for complications without adequate follow up.  This format is felt to be most appropriate for this patient at this time.  The patient did not have access to video technology/had technical difficulties with video requiring transitioning to audio format only (telephone).  All issues noted in this document were discussed and addressed.  No physical exam could be performed with this format.  Please refer to the patient's chart for his  consent to telehealth for [facility].   Evaluation Performed:  Follow-up visit  Date:  06/12/2020   ID:  Wayne Avila, DOB 12-25-1990, MRN [id number]  Patient Location: Home Provider Location: Home Office   Participants: Nurse for intake and work up; Patient and Provider for Visit and Wrap up  Method of visit: Telephone  Location of Patient: Home Location of Provider: Office Consent was obtain for visit over the telephone. Services rendered by provider: Visit was performed via telephone visit was performed via video  A video enabled telemedicine application was used and I verified that I am speaking with the correct person using two identifiers.  PCP:  Perlie Mayo, NP   Chief Complaint: Psychiatry referral needed  History of Present Illness:    Wayne Avila is a 30 y.o. male with history as stated below.  Mother does most of the conversation as she is the main caregiver, secondary to development delay, bipolar disorder, ADHD.   Overall biggest complaint today is that she is about to run out of his antipsychotic medicine and that [facility] is not answering any of her calls.  He needs refills he is on [place] which  is used to treat schizophrenia disorder and might be overlapping with bipolar disorder.  She reports his ADHD medicine was stopped since he was not in school or working.  She reports that he has a hard time calming down at night.  Advised that he needs treatment by a psychiatrist to make sure he is getting the appropriate treatment she is okay with this referral will be made we will do an increase of trazodone in the meantime to see if we can get his sleeping better until he seen.  The patient does not have symptoms concerning for COVID-19 infection (fever, chills, cough, or new shortness of breath).   Past Medical, Surgical, Social History, Allergies, and Medications have been Reviewed.  Past Medical History:  Diagnosis Date  . ADHD (attention deficit hyperactivity disorder)   . Anxiety   . Asthma   . Autoimmune hepatitis ([place])   . Bipolar 1 disorder ([place])   . Bipolar 1 disorder, manic, moderate ([place]) 11/09/2014  . C. difficile colitis 03/22/2016  . Clostridium difficile colitis   . Depression   . GERD (gastroesophageal reflux disease)   . HOH (hard of hearing)   . Mental developmental delay   . Seizures ([place])   . Ulcerative colitis [facility])    Past Surgical History:  Procedure Laterality Date  . ADENOIDECTOMY    . BIOPSY N/A 12/07/2012   Procedure: GASTRIC BIOPSIES;  Surgeon: [name], MD;  Location: [facility] ORS;  Service: Endoscopy;  Laterality: N/A;  . BIOPSY N/A 08/30/2013   Procedure: BIOPSY;  Surgeon:  [name], MD;  Location: [facility] ORS;  Service: Endoscopy;  Laterality: N/A;  right colon,transverse colon, left colon, rectal biopsies  . BIOPSY  11/20/2015   Procedure: BIOPSY;  Surgeon: [name], MD;  Location: [facility] ENDO SUITE;  Service: Endoscopy;;  ileal, right colon biopsy, left colon, rectum  . BIOPSY  04/18/2016   Procedure: BIOPSY;  Surgeon: [name], MD;  Location: [facility] END[facility] SU[facility];  Service: Endoscopy;;  left descending colon biopsies  . COLONOSCOPY  11/20/2015   Dr.  [name]: Severe erythema, edema, ulcers from the anal verge to 20 cm above the anal verge without mucosal sparing, remaining colon and terminal ileum appeared normal. Biopsies from the rectum revealed fulminant active chronic colitis consistent with IBD. Pathology from terminal ileum revealed intramucosal lymphoid aggregates. Remaining colon random biopsies with inactive chronic colitis  . COLONOSCOPY WITH PROPOFOL N/A 08/30/2013   QBV:QXIHWT mucosa in the terminal ileum/COLITIS/ MILD PROCTITIS. 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  Marland Kitchen COLONOSCOPY WITH PROPOFOL N/A 11/20/2015   Dr. 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 ESOPHAGOGASTRODUODENOSCOPY  11/20/2015   Dr. [name]: Normal exam, stomach biopsied and duodenal biopsy.. Benign biopsies.  . ESOPHAGOGASTRODUODENOSCOPY (EGD) WITH PROPOFOL N/A 12/07/2012   SLF:The mucosa of the esophagus appeared normal Non-erosive gastritis (inflammation) was found in the gastric antrum; multiple biopsies The duodenal mucosa showed no abnormalities in the bulb and second portion of the duodenum  . ESOPHAGOGASTRODUODENOSCOPY (EGD) WITH PROPOFOL N/A 11/20/2015   Dr. [name]: normal with normal biopsies, negative H.pylori   . ESOPHAGOGASTRODUODENOSCOPY (EGD) WITH PROPOFOL N/A 04/18/2016   Procedure: ESOPHAGOGASTRODUODENOSCOPY (EGD) WITH PROPOFOL;  Surgeon: [name], MD;  [facility];  Service: Endoscopy;  Laterality: N/A;  . FLEXIBLE SIGMOIDOSCOPY N/A 04/18/2016   Procedure: FLEXIBLE SIGMOIDOSCOPY;  Surgeon: [name], MD;  Location: [facility];  Service: Endoscopy;  Laterality: N/A;  . TONSILLECTOMY       Current Meds  Medication Sig  . albuterol (PROVENTIL) (2.5 MG/3ML) 0.083% nebulizer solution Inhale 3 mLs (2.5 mg total) into the lungs every 6 (six) hours as needed for wheezing or shortness  of breath.  Marland Kitchen albuterol (VENTOLIN HFA) 108 (90 Base) MCG/ACT inhaler Inhale into the lungs.  . cholecalciferol (VITAMIN D) 1000 units tablet Take 1 tablet (1,000 Units total) by mouth daily. START AFTER VITAMIN D 50,000 U TABLETS ARE COMPLETE  . EPINEPHrine 0.3 mg/0.3 mL IJ SOAJ injection Inject 0.3 mg into the muscle once as needed (for severe allergic reaction).  . fluticasone (FLONASE) 50 MCG/ACT nasal spray Place 1 spray into both nostrils daily.  Marland Kitchen loratadine (CLARITIN) 10 MG tablet Take 10 mg by mouth.  . montelukast (SINGULAIR) 10 MG tablet Take 10 mg by mouth.  . paliperidone (INVEGA) 6 MG 24 hr tablet Take 6 mg by mouth daily.  . propranolol (INDERAL) 10 MG tablet Take 1 tablet by mouth 3 (three) times daily.  Marland Kitchen Respiratory Therapy Supplies (NEBULIZER COMPRESSOR) KIT And supplies  . sertraline (ZOLOFT) 50 MG tablet Take 100 mg by mouth daily.   . traZODone (DESYREL) 50 MG tablet Take 100 mg by mouth at bedtime.      Allergies:   Nsaids, Pineapple, Remicade [infliximab], Strawberry extract, Amoxicillin-pot clavulanate, Omeprazole, and Tomato   ROS:   Please see the history of present illness.    All other systems reviewed and are negative.  Labs/Other Tests and Data Reviewed:    Recent Labs: 04/04/2020: ALT 23; BUN 7; Creatinine, Ser 0.97; Hemoglobin 13.7; Platelets 238; Potassium 4.3; Sodium 141   Recent Lipid Panel Lab Results  Component Value Date/Time   CHOL 153 05/05/2017 12:17 PM   TRIG 164 (H) 05/05/2017 12:17 PM   HDL 37 (L) 05/05/2017 12:17 PM   CHOLHDL 4.1 05/05/2017 12:17 PM   LDLCALC 90 05/05/2017 12:17 PM    Wt Readings from Last 3 Encounters:  04/04/20 273 lb (123.8 kg)  02/10/18 262 lb 9 oz (119.1 kg)  01/26/18 262 lb 9 oz (119.1 kg)     Objective:    Vital Signs:  There were no vitals taken for this visit.   VITAL SIGNS:  reviewed GEN:  no acute distress RESPIRATORY:  No shortness of breath noted in conversation PSYCH:  Flat  affect  ASSESSMENT & PLAN:    1. Bipolar 1 disorder ([place]) Referral to psychiatry    - traZODone (DESYREL) 150 MG tablet; Take 1 tablet (150 mg total) by mouth at bedtime.  Dispense: 30 tablet; Refill: 1  2. Attention deficit hyperactivity disorder (ADHD), predominantly hyperactive type Referral to psychiatry - traZODone (DESYREL) 150 MG tablet; Take 1 tablet (150 mg total) by mouth at bedtime.  Dispense: 30 tablet; Refill: 1  3. Environmental allergies  Refills provided - loratadine (CLARITIN) 10 MG tablet; Take 1 tablet (10 mg total) by mouth daily.  Dispense: 90 tablet; Refill: 1 - montelukast (SINGULAIR) 10 MG tablet; Take 1 tablet (10 mg total) by mouth at bedtime.  Dispense: 90 tablet; Refill: 1   Time:   Today, I have spent 10 minutes with the patient with telehealth technology discussing the above problems.     Medication Adjustments/Labs and Tests Ordered: Current medicines are reviewed at length with the patient today.  Concerns regarding medicines are outlined above.   Tests Ordered: No orders of the defined types were placed in this encounter.   Medication Changes: No orders of the defined types were placed in this encounter.    Disposition:  Follow up 07/05/2020 as scheduled  Signed, Perlie Mayo, NP  06/12/2020 1:08 PM     [facility]

## 2020-06-12 NOTE — Assessment & Plan Note (Signed)
Overall biggest complaint today is that she is about to run out of his antipsychotic medicine and that [facility] is not answering any of her calls.  He needs refills he is on [place] which is used to treat schizophrenia disorder and might be overlapping with bipolar disorder.  She reports his ADHD medicine was stopped since he was not in school or working.  She reports that he has a hard time calming down at night.  Advised that he needs treatment by a psychiatrist to make sure he is getting the appropriate treatment she is okay with this referral will be made we will do an increase of trazodone in the meantime to see if we can get his sleeping better until he seen.

## 2020-06-12 NOTE — Assessment & Plan Note (Signed)
Controlled refill of meds provided

## 2020-06-13 ENCOUNTER — Encounter: Payer: Self-pay | Admitting: Family Medicine

## 2020-06-13 ENCOUNTER — Other Ambulatory Visit: Payer: Self-pay | Admitting: Family Medicine

## 2020-06-13 DIAGNOSIS — K219 Gastro-esophageal reflux disease without esophagitis: Secondary | ICD-10-CM

## 2020-06-13 MED ORDER — PANTOPRAZOLE SODIUM 20 MG PO TBEC: 20.0000 mg | DELAYED_RELEASE_TABLET | Freq: Every day | ORAL | 1 refills | Status: DC

## 2020-06-15 ENCOUNTER — Other Ambulatory Visit: Payer: Medicaid Other

## 2020-06-15 ENCOUNTER — Other Ambulatory Visit: Payer: Self-pay

## 2020-06-15 DIAGNOSIS — Z20822 Contact with and (suspected) exposure to covid-19: Secondary | ICD-10-CM

## 2020-06-15 NOTE — Progress Notes (Deleted)
NEUROLOGY CONSULTATION NOTE  ADVAY VOLANTE MRN: [id number] DOB: [birth year]  Referring provider: Cherly Beach, NP Primary care provider: Cherly Beach, NP  Reason for consult:  migraines   Subjective:  Wayne Avila is a 30 year old ***-handed male with mental developmental delay, Bipolar 1 disorder, depression, anxiety, ADHD, autoimmune hepatitis, ulcerative colitis s/p *** and remote history of seizures who presents for migraines.   Onset:  *** Location:  *** Quality:  *** Intensity:  ***.  *** denies new headache, thunderclap headache or severe headache that wakes *** from sleep. Aura:  *** Prodrome:  *** Postdrome:  *** Associated symptoms:  ***.  *** denies associated unilateral numbness or weakness. Duration:  *** Frequency:  *** Frequency of abortive medication: *** Triggers:  *** Relieving factors:  *** Activity:  ***  MRI of brain with and without contrast on 04/03/2014 and CT head without contrast on 08/27/2017 were personally reviewed and were normal.  Current NSAIDS/analgesics:  Tylenol Current triptans:  none Current ergotamine:  none Current anti-emetic:  none Current muscle relaxants:  none Current Antihypertensive medications:  Propranolol 82m TID Current Antidepressant medications:  Sertraline 1010mdaily, trazodone 15040mHS Current Anticonvulsant medications:  none Current anti-CGRP:  none Current Vitamins/Herbal/Supplements:  *** Current Antihistamines/Decongestants:  Claritin, Flonase Other therapy:  *** Hormone/birth control:  none  Past NSAIDS/analgesics:  Unable to take NSAIDs due to history of ulcerative colitis, tramadol Past abortive triptans:  *** Past abortive ergotamine: none Past muscle relaxants:  cyclobenzaprine Past anti-emetic:  Reglan, Zofran, Promethazine Past antihypertensive medications:  clonidine Past antidepressant medications:  *** Past anticonvulsant medications:  Depakote Past anti-CGRP:  *** Past  vitamins/Herbal/Supplements:  Benadryl, Zantac Past antihistamines/decongestants:  *** Other past therapies:  ***  Caffeine:  *** Alcohol:  *** Smoker:  *** Diet:  *** Exercise:  *** Depression:  ***; Anxiety:  *** Other pain:  *** Sleep hygiene:  *** Family history of headache:  ***   PAST MEDICAL HISTORY: Past Medical History:  Diagnosis Date  . ADHD (attention deficit hyperactivity disorder)   . Anxiety   . Asthma   . [place]immune [place] ([place] . Bipolar 1 disorder ([facility]CC[place] . Bipolar 1 disorder, manic, moderate (HCCAlpha/07/2014  . C. difficile colitis 03/22/2016  . Clostridium difficile colitis   . Depression   . GERD (gastroesophageal reflux disease)   . HOH (hard of hearing)   . Mental developmental delay   . Seizures ([facility] . Ulcerative colitis ([facility]CC[place]   PAST SURGICAL HISTORY: Past Surgical History:  Procedure Laterality Date  . ADENOIDECTOMY    . BIOPSY N/A 12/07/2012   Procedure: GASTRIC BIOPSIES;  Surgeon: SanDanie BinderD;  Location: [facility];  Service: Endoscopy;  Laterality: N/A;  . BIOPSY N/A 08/30/2013   Procedure: BIOPSY;  Surgeon: SanDanie BinderD;  Location: [facility];  Service: Endoscopy;  Laterality: N/A;  right colon,transverse colon, left colon, rectal biopsies  . BIOPSY  11/20/2015   Procedure: BIOPSY;  Surgeon: SanDanie BinderD;  [facility];  Service: Endoscopy;;  ileal, right colon biopsy, left colon, rectum  . BIOPSY  04/18/2016   Procedure: BIOPSY;  Surgeon: RobDaneil DolinD;  [facility];  Service: Endoscopy;;  left descending colon biopsies  . COLONOSCOPY  11/20/2015   Dr. fieOneida Alarevere erythema, edema, ulcers from the anal verge to 20 cm above the anal verge without mucosal sparing, remaining colon and terminal ileum appeared normal. Biopsies  from the rectum revealed fulminant active chronic colitis consistent with IBD. Pathology from terminal ileum revealed intramucosal lymphoid aggregates. Remaining colon random  biopsies with inactive chronic colitis  . COLONOSCOPY WITH PROPOFOL N/A 08/30/2013   HBZ:JIRCVE mucosa in the terminal ileum/COLITIS/ MILD PROCTITIS. 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  Marland Kitchen COLONOSCOPY WITH PROPOFOL N/A 11/20/2015   Dr. 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 ESOPHAGOGASTRODUODENOSCOPY  11/20/2015   Dr. [name]: Normal exam, stomach biopsied and duodenal biopsy.. Benign biopsies.  . ESOPHAGOGASTRODUODENOSCOPY (EGD) WITH PROPOFOL N/A 12/07/2012   SLF:The mucosa of the esophagus appeared normal Non-erosive gastritis (inflammation) was found in the gastric antrum; multiple biopsies The duodenal mucosa showed no abnormalities in the bulb and second portion of the duodenum  . ESOPHAGOGASTRODUODENOSCOPY (EGD) WITH PROPOFOL N/A 11/20/2015   Dr. [name]: normal with normal biopsies, negative H.pylori   . ESOPHAGOGASTRODUODENOSCOPY (EGD) WITH PROPOFOL N/A 04/18/2016   Procedure: ESOPHAGOGASTRODUODENOSCOPY (EGD) WITH PROPOFOL;  Surgeon: [name], MD;  [facility];  Service: Endoscopy;  Laterality: N/A;  . FLEXIBLE SIGMOIDOSCOPY N/A 04/18/2016   Procedure: FLEXIBLE SIGMOIDOSCOPY;  Surgeon: [name], MD;  [facility];  Service: Endoscopy;  Laterality: N/A;  . TONSILLECTOMY      MEDICATIONS: Current Outpatient Medications on File Prior to Visit  Medication Sig Dispense Refill  . albuterol (PROVENTIL) (2.5 MG/3ML) 0.083% nebulizer solution Inhale 3 mLs (2.5 mg total) into the lungs every 6 (six) hours as needed for wheezing or shortness of breath. 60 vial 2  . albuterol (VENTOLIN HFA) 108 (90 Base) MCG/ACT inhaler Inhale into the lungs.    . cholecalciferol (VITAMIN D) 1000 units tablet Take 1 tablet (1,000 Units total) by mouth daily. START AFTER VITAMIN D 50,000 U TABLETS ARE COMPLETE 30 tablet 11  .  EPINEPHrine 0.3 mg/0.3 mL IJ SOAJ injection Inject 0.3 mg into the muscle once as needed (for severe allergic reaction).    . fluticasone (FLONASE) 50 MCG/ACT nasal spray Place 1 spray into both nostrils daily. 16 g 0  . loratadine (CLARITIN) 10 MG tablet Take 1 tablet (10 mg total) by mouth daily. 90 tablet 1  . montelukast (SINGULAIR) 10 MG tablet Take 1 tablet (10 mg total) by mouth at bedtime. 90 tablet 1  . paliperidone (INVEGA) 6 MG 24 hr tablet Take 6 mg by mouth daily.    . pantoprazole (PROTONIX) 20 MG tablet Take 1 tablet (20 mg total) by mouth daily. 30 tablet 1  . propranolol (INDERAL) 10 MG tablet Take 1 tablet by mouth 3 (three) times daily.  2  . Respiratory Therapy Supplies (NEBULIZER COMPRESSOR) KIT And supplies 1 each 0  . sertraline (ZOLOFT) 50 MG tablet Take 100 mg by mouth daily.     . traZODone (DESYREL) 150 MG tablet Take 1 tablet (150 mg total) by mouth at bedtime. 30 tablet 1   No current facility-administered medications on file prior to visit.    ALLERGIES: Allergies  Allergen Reactions  . Nsaids Other (See Comments)    Should not take due to GI condition  . Pineapple Swelling and Other (See Comments)    Reaction:  Lip swelling  . Remicade [Infliximab] Other (See Comments)    AUTOIMMUNE HEPATITIS  . Strawberry Extract Swelling    Reaction:  Lip swelling  . Amoxicillin-Pot Clavulanate Nausea And Vomiting and Other (See Comments)    GI  intolerance   . Omeprazole Nausea And Vomiting  . Tomato Rash    FAMILY HISTORY: Family History  Problem Relation Age of Onset  . Asthma Mother   . Ulcers Mother   . Bipolar disorder Mother   . ADD / ADHD Father   . Diabetes Maternal Grandmother   . Hypertension Maternal Grandmother   . Sleep apnea Maternal Grandmother   . Heart disease Maternal Grandmother   . Diabetes Maternal Grandfather   . Hypertension Maternal Grandfather   . Sleep apnea Maternal Grandfather   . Heart disease Maternal Grandfather   . Cancer  Maternal Grandfather        prostate  . Cancer Maternal Aunt        Leukemia  . Colon cancer Neg Hx   . Liver disease Neg Hx    ***.  SOCIAL HISTORY: Social History   Socioeconomic History  . Marital status: Single    Spouse name: Not on file  . Number of children: 1  . Years of education: 12th  . Highest education level: Not on file  Occupational History    Employer: NOT EMPLOYED    Comment: disabled  Tobacco Use  . Smoking status: Former Smoker    Packs/day: 0.00    Years: 2.00    Pack years: 0.00    Quit date: 10/06/2012    Years since quitting: 7.6  . Smokeless tobacco: Never Used  . Tobacco comment: Never smoked cigarettes  Vaping Use  . Vaping Use: Never used  Substance and Sexual Activity  . Alcohol use: No    Alcohol/week: 0.0 standard drinks    Comment: denies usage  . Drug use: No  . Sexual activity: Yes    Birth control/protection: None  Other Topics Concern  . Not on file  Social History Narrative   Lives with mother   Cory Roughen and Elenor Legato in the home.   Caffeine Use: occasionally   Diet: eats all food groups   Water: 6-8 cups daily       Likes to game late and sleep in until noon   [facility]      Does not drive   Wears seat belt    Smoke detectors    No weapons   Social Determinants of Health   Financial Resource Strain: Low Risk   . Difficulty of Paying Living Expenses: Not hard at all  Food Insecurity: No Food Insecurity  . Worried About Charity fundraiser in the Last Year: Never true  . Ran Out of Food in the Last Year: Never true  Transportation Needs: No Transportation Needs  . Lack of Transportation (Medical): No  . Lack of Transportation (Non-Medical): No  Physical Activity: Inactive  . Days of Exercise per Week: 0 days  . Minutes of Exercise per Session: 0 min  Stress: Stress Concern Present  . Feeling of Stress : To some extent  Social Connections: Moderately Isolated  . Frequency of Communication  with Friends and Family: More than three times a week  . Frequency of Social Gatherings with Friends and Family: Twice a week  . Attends Religious Services: 1 to 4 times per year  . Active Member of Clubs or Organizations: No  . Attends Archivist Meetings: Never  . Marital Status: Separated  Intimate Partner Violence: Not At Risk  . Fear of Current or Ex-Partner: No  . Emotionally Abused: No  . Physically Abused: No  . Sexually Abused: No  Objective:  *** General: No acute distress.  Patient appears well-groomed.   Head:  Normocephalic/atraumatic Eyes:  fundi examined but not visualized Neck: supple, no paraspinal tenderness, full range of motion Back: No paraspinal tenderness Heart: regular rate and rhythm Lungs: Clear to auscultation bilaterally. Vascular: No carotid bruits. Neurological Exam: Mental status: alert and oriented to person, place, and time, recent and remote memory intact, fund of knowledge intact, attention and concentration intact, speech fluent and not dysarthric, language intact. Cranial nerves: CN I: not tested CN II: pupils equal, round and reactive to light, visual fields intact CN III, IV, VI:  full range of motion, no nystagmus, no ptosis CN V: facial sensation intact. CN VII: upper and lower face symmetric CN VIII: hearing intact CN IX, X: gag intact, uvula midline CN XI: sternocleidomastoid and trapezius muscles intact CN XII: tongue midline Bulk & Tone: normal, no fasciculations. Motor:  muscle strength 5/5 throughout Sensation:  Pinprick, temperature and vibratory sensation intact. Deep Tendon Reflexes:  2+ throughout,  toes downgoing.   Finger to nose testing:  Without dysmetria.   Heel to shin:  Without dysmetria.   Gait:  Normal station and stride.  Romberg negative.  Assessment/Plan:   ***    Thank you for allowing me to take part in the care of this patient.  Metta Clines, DO  CC: ***

## 2020-06-18 ENCOUNTER — Ambulatory Visit: Payer: Medicaid Other | Admitting: Neurology

## 2020-06-19 LAB — NOVEL CORONAVIRUS, NAA: SARS-CoV-2, NAA: NOT DETECTED

## 2020-07-05 ENCOUNTER — Encounter: Payer: Self-pay | Admitting: Nurse Practitioner

## 2020-07-05 ENCOUNTER — Other Ambulatory Visit: Payer: Self-pay

## 2020-07-05 ENCOUNTER — Telehealth (INDEPENDENT_AMBULATORY_CARE_PROVIDER_SITE_OTHER): Payer: Medicaid Other | Admitting: Nurse Practitioner

## 2020-07-05 DIAGNOSIS — F319 Bipolar disorder, unspecified: Secondary | ICD-10-CM | POA: Diagnosis not present

## 2020-07-05 DIAGNOSIS — K51011 Ulcerative (chronic) pancolitis with rectal bleeding: Secondary | ICD-10-CM | POA: Diagnosis not present

## 2020-07-05 NOTE — Assessment & Plan Note (Signed)
-  he is no longer taking sertraline, invega, or ADHD medication -he was being seen at [facility], unsure why the is no longer under their care -has multiple mental diagnoses including bipolar, ADHD, and developmental delay -refer to psych for med management

## 2020-07-05 NOTE — Assessment & Plan Note (Addendum)
-  he was referred to GI in November, but this was denied; unsure as to why -denies bloody stools today, but with complex GI history, he needs a gastroenterologist  -will refer to Dr. Laural Golden

## 2020-07-05 NOTE — Progress Notes (Signed)
Established Patient Office Visit  Subjective:  Patient ID: Wayne Avila, male    DOB: 1991-04-17  Age: 30 y.o. MRN: [id number]  CC:  Chief Complaint  Patient presents with  . Follow-up    HPI [name] presents for 3 month follow-up.  He was seen in-office 04/04/20 for morbid obesity, ulcerative pancolitis with rectal bleeding, bloody stools, and migraine.  He has been having frequent stools, but denies blood. He was denied referral to Dr. Gala Romney.  His mother states he has been taking an antipsychotic medication from [facility]. He was taking invega, zoloft, and some ADHD medication. The ADHD medication was stopped by [facility].  Past Medical History:  Diagnosis Date  . ADHD (attention deficit hyperactivity disorder)   . Anxiety   . Asthma   . Autoimmune hepatitis ([place])   . Bipolar 1 disorder ([place])   . Bipolar 1 disorder, manic, moderate ([place]) 11/09/2014  . C. difficile colitis 03/22/2016  . Clostridium difficile colitis   . Depression   . GERD (gastroesophageal reflux disease)   . HOH (hard of hearing)   . Mental developmental delay   . Seizures ([place])   . Ulcerative colitis [facility])     Past Surgical History:  Procedure Laterality Date  . ADENOIDECTOMY    . BIOPSY N/A 12/07/2012   Procedure: GASTRIC BIOPSIES;  Surgeon: [name], MD;  Location: [facility];  Service: Endoscopy;  Laterality: N/A;  . BIOPSY N/A 08/30/2013   Procedure: BIOPSY;  Surgeon: [name], MD;  Location: [facility];  Service: Endoscopy;  Laterality: N/A;  right colon,transverse colon, left colon, rectal biopsies  . BIOPSY  11/20/2015   Procedure: BIOPSY;  Surgeon: [name], MD;  [facility];  Service: Endoscopy;;  ileal, right colon biopsy, left colon, rectum  . BIOPSY  04/18/2016   Procedure: BIOPSY;  Surgeon: [name], MD;  [facility];  Service: Endoscopy;;  left descending colon biopsies  . COLONOSCOPY  11/20/2015   Dr. [name]: Severe erythema, edema, ulcers  from the anal verge to 20 cm above the anal verge without mucosal sparing, remaining colon and terminal ileum appeared normal. Biopsies from the rectum revealed fulminant active chronic colitis consistent with IBD. Pathology from terminal ileum revealed intramucosal lymphoid aggregates. Remaining colon random biopsies with inactive chronic colitis  . COLONOSCOPY WITH PROPOFOL N/A 08/30/2013   JIR:CVELFY mucosa in the terminal ileum/COLITIS/ MILD PROCTITIS. 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  Marland Kitchen COLONOSCOPY WITH PROPOFOL N/A 11/20/2015   Dr. 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 ESOPHAGOGASTRODUODENOSCOPY  11/20/2015   Dr. [name]: Normal exam, stomach biopsied and duodenal biopsy.. Benign biopsies.  . ESOPHAGOGASTRODUODENOSCOPY (EGD) WITH PROPOFOL N/A 12/07/2012   SLF:The mucosa of the esophagus appeared normal Non-erosive gastritis (inflammation) was found in the gastric antrum; multiple biopsies The duodenal mucosa showed no abnormalities in the bulb and second portion of the duodenum  . ESOPHAGOGASTRODUODENOSCOPY (EGD) WITH PROPOFOL N/A 11/20/2015   Dr. [name]: normal with normal biopsies, negative H.pylori   . ESOPHAGOGASTRODUODENOSCOPY (EGD) WITH PROPOFOL N/A 04/18/2016   Procedure: ESOPHAGOGASTRODUODENOSCOPY (EGD) WITH PROPOFOL;  Surgeon: [name], MD;  [facility];  Service: Endoscopy;  Laterality: N/A;  . FLEXIBLE SIGMOIDOSCOPY N/A 04/18/2016   Procedure: FLEXIBLE SIGMOIDOSCOPY;  Surgeon: [name], MD;  [facility];  Service: Endoscopy;  Laterality:  N/A;  . TONSILLECTOMY      Family History  Problem Relation Age of Onset  . Asthma Mother   . Ulcers Mother   . Bipolar disorder Mother   . ADD / ADHD Father   . Diabetes Maternal Grandmother   . Hypertension Maternal Grandmother   . Sleep apnea  Maternal Grandmother   . Heart disease Maternal Grandmother   . Diabetes Maternal Grandfather   . Hypertension Maternal Grandfather   . Sleep apnea Maternal Grandfather   . Heart disease Maternal Grandfather   . Cancer Maternal Grandfather        prostate  . Cancer Maternal Aunt        Leukemia  . Colon cancer Neg Hx   . Liver disease Neg Hx     Social History   Socioeconomic History  . Marital status: Single    Spouse name: Not on file  . Number of children: 1  . Years of education: 12th  . Highest education level: Not on file  Occupational History    Employer: NOT EMPLOYED    Comment: disabled  Tobacco Use  . Smoking status: Former Smoker    Packs/day: 0.00    Years: 2.00    Pack years: 0.00    Quit date: 10/06/2012    Years since quitting: 7.7  . Smokeless tobacco: Never Used  . Tobacco comment: Never smoked cigarettes  Vaping Use  . Vaping Use: Never used  Substance and Sexual Activity  . Alcohol use: No    Alcohol/week: 0.0 standard drinks    Comment: denies usage  . Drug use: No  . Sexual activity: Yes    Birth control/protection: None  Other Topics Concern  . Not on file  Social History Narrative   Lives with mother   Cory Roughen and Elenor Legato in the home.   Caffeine Use: occasionally   Diet: eats all food groups   Water: 6-8 cups daily       Likes to game late and sleep in until noon   [facility]      Does not drive   Wears seat belt    Smoke detectors    No weapons   Social Determinants of Health   Financial Resource Strain: Low Risk   . Difficulty of Paying Living Expenses: Not hard at all  Food Insecurity: No Food Insecurity  . Worried About Charity fundraiser in the Last Year: Never true  . Ran Out of Food in the Last Year: Never true  Transportation Needs: No Transportation Needs  . Lack of Transportation (Medical): No  . Lack of Transportation (Non-Medical): No  Physical Activity: Inactive  . Days of Exercise per  Week: 0 days  . Minutes of Exercise per Session: 0 min  Stress: Stress Concern Present  . Feeling of Stress : To some extent  Social Connections: Moderately Isolated  . Frequency of Communication with Friends and Family: More than three times a week  . Frequency of Social Gatherings with Friends and Family: Twice a week  . Attends Religious Services: 1 to 4 times per year  . Active Member of Clubs or Organizations: No  . Attends Archivist Meetings: Never  . Marital Status: Separated  Intimate Partner Violence: Not At Risk  . Fear of Current or Ex-Partner: No  . Emotionally Abused: No  . Physically Abused: No  . Sexually Abused: No    Outpatient Medications Prior to Visit  Medication Sig Dispense  Refill  . albuterol (PROVENTIL) (2.5 MG/3ML) 0.083% nebulizer solution Inhale 3 mLs (2.5 mg total) into the lungs every 6 (six) hours as needed for wheezing or shortness of breath. 60 vial 2  . albuterol (VENTOLIN HFA) 108 (90 Base) MCG/ACT inhaler Inhale into the lungs.    . cholecalciferol (VITAMIN D) 1000 units tablet Take 1 tablet (1,000 Units total) by mouth daily. START AFTER VITAMIN D 50,000 U TABLETS ARE COMPLETE 30 tablet 11  . EPINEPHrine 0.3 mg/0.3 mL IJ SOAJ injection Inject 0.3 mg into the muscle once as needed (for severe allergic reaction).    . fluticasone (FLONASE) 50 MCG/ACT nasal spray Place 1 spray into both nostrils daily. 16 g 0  . loratadine (CLARITIN) 10 MG tablet Take 1 tablet (10 mg total) by mouth daily. 90 tablet 1  . montelukast (SINGULAIR) 10 MG tablet Take 1 tablet (10 mg total) by mouth at bedtime. 90 tablet 1  . pantoprazole (PROTONIX) 20 MG tablet Take 1 tablet (20 mg total) by mouth daily. 30 tablet 1  . propranolol (INDERAL) 10 MG tablet Take 1 tablet by mouth 3 (three) times daily.  2  . Respiratory Therapy Supplies (NEBULIZER COMPRESSOR) KIT And supplies 1 each 0  . traZODone (DESYREL) 150 MG tablet Take 1 tablet (150 mg total) by mouth at  bedtime. 30 tablet 1  . paliperidone (INVEGA) 6 MG 24 hr tablet Take 6 mg by mouth daily. (Patient not taking: Reported on 07/05/2020)    . sertraline (ZOLOFT) 50 MG tablet Take 100 mg by mouth daily.  (Patient not taking: Reported on 07/05/2020)     No facility-administered medications prior to visit.    Allergies  Allergen Reactions  . Nsaids Other (See Comments)    Should not take due to GI condition  . Pineapple Swelling and Other (See Comments)    Reaction:  Lip swelling  . Remicade [Infliximab] Other (See Comments)    AUTOIMMUNE HEPATITIS  . Strawberry Extract Swelling    Reaction:  Lip swelling  . Amoxicillin-Pot Clavulanate Nausea And Vomiting and Other (See Comments)    GI intolerance   . Omeprazole Nausea And Vomiting  . Tomato Rash    ROS Review of Systems  Constitutional: Negative.   Respiratory: Negative.   Cardiovascular: Negative.   Gastrointestinal:       J-tube in place; has frequent BMs; no blood in stool today  Psychiatric/Behavioral: Positive for decreased concentration. Negative for self-injury and suicidal ideas.      Objective:    Physical Exam  Ht 5' 8.5" (1.74 m)   Wt 250 lb (113.4 kg)   BMI 37.46 kg/m  Wt Readings from Last 3 Encounters:  07/05/20 250 lb (113.4 kg)  04/04/20 273 lb (123.8 kg)  02/10/18 262 lb 9 oz (119.1 kg)     Health Maintenance Due  Topic Date Due  . COVID-19 Vaccine (1) Never done    There are no preventive care reminders to display for this patient.  Lab Results  Component Value Date   TSH 2.909 03/24/2016   Lab Results  Component Value Date   WBC 7.1 04/04/2020   HGB 13.7 04/04/2020   HCT 40.6 04/04/2020   MCV 84 04/04/2020   PLT 238 04/04/2020   Lab Results  Component Value Date   NA 141 04/04/2020   K 4.3 04/04/2020   CO2 19 (L) 04/04/2020   GLUCOSE 94 04/04/2020   BUN 7 04/04/2020   CREATININE 0.97 04/04/2020   BILITOT 0.3 04/04/2020  ALKPHOS 95 04/04/2020   AST 26 04/04/2020   ALT 23  04/04/2020   PROT 6.9 04/04/2020   ALBUMIN 4.3 04/04/2020   CALCIUM 9.5 04/04/2020   ANIONGAP 7 03/08/2018   Lab Results  Component Value Date   CHOL 153 05/05/2017   Lab Results  Component Value Date   HDL 37 (L) 05/05/2017   Lab Results  Component Value Date   LDLCALC 90 05/05/2017   Lab Results  Component Value Date   TRIG 164 (H) 05/05/2017   Lab Results  Component Value Date   CHOLHDL 4.1 05/05/2017   Lab Results  Component Value Date   HGBA1C 5.8 (H) 04/04/2020      Assessment & Plan:   Problem List Items Addressed This Visit      Digestive   Ulcerative colitis ([place])    -he was referred to GI in November, but this was denied; unsure as to why -denies bloody stools today, but with complex GI history, he needs a gastroenterologist  -will refer to Dr. Laural Golden        Other   Bipolar 1 disorder ([place])    -he is no longer taking sertraline, invega, or ADHD medication -he was being seen at [facility], unsure why the is no longer under their care -has multiple mental diagnoses including bipolar, ADHD, and developmental delay -refer to psych for med management         No orders of the defined types were placed in this encounter.   Follow-up: Return in about 3 months (around 10/03/2020) for 3 month follow-up.   Date:  07/05/2020   Location of Patient: Home Location of Provider: Office Consent was obtain for visit to be over via telehealth. I verified that I am speaking with the correct person using two identifiers.  I connected with  [name] on 07/05/20 via telephone and verified that I am speaking with the correct person using two identifiers.   I discussed the limitations of evaluation and management by telemedicine. The patient expressed understanding and agreed to proceed.  Time spent: 18 minutes   Noreene Larsson, NP

## 2020-07-17 DIAGNOSIS — F909 Attention-deficit hyperactivity disorder, unspecified type: Principal | ICD-10-CM

## 2020-07-17 DIAGNOSIS — R519 Headache, unspecified: Principal | ICD-10-CM

## 2020-07-17 DIAGNOSIS — G43009 Migraine without aura, not intractable, without status migrainosus: Principal | ICD-10-CM

## 2020-07-17 DIAGNOSIS — I10 Essential (primary) hypertension: Principal | ICD-10-CM

## 2020-07-17 DIAGNOSIS — J45909 Unspecified asthma, uncomplicated: Principal | ICD-10-CM

## 2020-07-17 DIAGNOSIS — F319 Bipolar disorder, unspecified: Principal | ICD-10-CM

## 2020-07-17 DIAGNOSIS — Z79899 Other long term (current) drug therapy: Principal | ICD-10-CM

## 2020-07-18 ENCOUNTER — Ambulatory Visit: Admit: 2020-07-18 | Discharge: 2020-07-18 | Disposition: A | Discharge status: Home / Self Care | Payer: MEDICAID

## 2020-07-18 MED ORDER — ONDANSETRON 4 MG DISINTEGRATING TABLET
ORAL_TABLET | Freq: Three times a day (TID) | ORAL | 0 refills | 5 days | Status: CP | PRN
Start: 2020-07-18 — End: 2020-07-25

## 2020-07-18 MED ORDER — BUTALBITAL-ACETAMINOPHEN-CAFFEINE 50 MG-300 MG-40 MG CAPSULE
ORAL_CAPSULE | ORAL | 0 refills | 4.00000 days | Status: CP | PRN
Start: 2020-07-18 — End: ?

## 2020-07-19 ENCOUNTER — Encounter: Payer: Self-pay | Admitting: Internal Medicine

## 2020-07-19 ENCOUNTER — Other Ambulatory Visit: Payer: Self-pay

## 2020-07-19 ENCOUNTER — Ambulatory Visit (INDEPENDENT_AMBULATORY_CARE_PROVIDER_SITE_OTHER): Payer: Medicaid Other | Admitting: Internal Medicine

## 2020-07-19 VITALS — BP 118/74 | HR 79 | Temp 98.9°F | Resp 18 | Ht 68.5 in | Wt 289.4 lb

## 2020-07-19 DIAGNOSIS — K219 Gastro-esophageal reflux disease without esophagitis: Secondary | ICD-10-CM

## 2020-07-19 DIAGNOSIS — G43809 Other migraine, not intractable, without status migrainosus: Secondary | ICD-10-CM | POA: Diagnosis not present

## 2020-07-19 DIAGNOSIS — K51019 Ulcerative (chronic) pancolitis with unspecified complications: Secondary | ICD-10-CM

## 2020-07-19 DIAGNOSIS — Z09 Encounter for follow-up examination after completed treatment for conditions other than malignant neoplasm: Secondary | ICD-10-CM

## 2020-07-19 MED ORDER — PANTOPRAZOLE SODIUM 20 MG PO TBEC: 20.0000 mg | DELAYED_RELEASE_TABLET | Freq: Every day | ORAL | 1 refills | Status: DC

## 2020-07-19 NOTE — Patient Instructions (Signed)
Please continue to take medications as prescribed.  Please follow low salt diet and perform moderate exercise/walking at least 150 mins/week.

## 2020-07-19 NOTE — Progress Notes (Signed)
Acute Office Visit  Subjective:    Patient ID: Wayne Avila, male    DOB: 1991/04/13, 30 y.o.   MRN: [id number]  Chief Complaint  Patient presents with  . Hypertension    Pt was sent by ems to er on 07-17-20 his bp had been really high for a few days was told bp as triggering migraines and nausea and told him to follow up with PCP     HPI Patient is in today for evaluation after an ER visit. He went to ER for evaluation for migraine and was also found to have high BP. He has an appointment with Neurology for evaluation. He still has intermittent episodes of headache, worse with light and noise. He takes Butalbital-APAP-Caffeine for headaches.  He also is awaiting Psychiatry appointment for bipolar disorder.  He had been referred to local GI for UC, but local GI office has referred him [facility]. His mother prefers to have a local GI follow-up. Denies any melena or hematochezia currently.  His BP was initially elevated, but was 118/74 later on. Denies any chest pain, dyspnea or palpitations.  Past Medical History:  Diagnosis Date  . ADHD (attention deficit hyperactivity disorder)   . Anxiety   . Asthma   . Autoimmune hepatitis ([place])   . Bipolar 1 disorder ([place])   . Bipolar 1 disorder, manic, moderate ([place]) 11/09/2014  . C. difficile colitis 03/22/2016  . Clostridium difficile colitis   . Depression   . GERD (gastroesophageal reflux disease)   . HOH (hard of hearing)   . Mental developmental delay   . Seizures ([place])   . Ulcerative colitis [facility])     Past Surgical History:  Procedure Laterality Date  . ADENOIDECTOMY    . BIOPSY N/A 12/07/2012   Procedure: GASTRIC BIOPSIES;  Surgeon: [name], MD;  Location: [facility];  Service: Endoscopy;  Laterality: N/A;  . BIOPSY N/A 08/30/2013   Procedure: BIOPSY;  Surgeon: [name], MD;  Location: [facility];  Service: Endoscopy;  Laterality: N/A;  right colon,transverse colon, left colon, rectal biopsies  . BIOPSY  11/20/2015    Procedure: BIOPSY;  Surgeon: [name], MD;  Location[facility];  Service: Endoscopy;;  ileal, right colon biopsy, left colon, rectum  . BIOPSY  04/18/2016   Procedure: BIOPSY;  Surgeon: [name], MD;  Location: [facility];  Service: Endoscopy;;  left descending colon biopsies  . COLONOSCOPY  11/20/2015   Dr. [name]: Severe erythema, edema, ulcers from the anal verge to 20 cm above the anal verge without mucosal sparing, remaining colon and terminal ileum appeared normal. Biopsies from the rectum revealed fulminant active chronic colitis consistent with IBD. Pathology from terminal ileum revealed intramucosal lymphoid aggregates. Remaining colon random biopsies with inactive chronic colitis  . COLONOSCOPY WITH PROPOFOL N/A 08/30/2013   JKK:XFGHWE mucosa in the terminal ileum/COLITIS/ MILD PROCTITIS. 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  Marland Kitchen COLONOSCOPY WITH PROPOFOL N/A 11/20/2015   Dr. 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 ESOPHAGOGASTRODUODENOSCOPY  11/20/2015   Dr. [name]: Normal exam, stomach biopsied and duodenal biopsy.. Benign biopsies.  . ESOPHAGOGASTRODUODENOSCOPY (EGD) WITH PROPOFOL N/A 12/07/2012   SLF:The mucosa of the esophagus appeared normal Non-erosive gastritis (inflammation) was found in the gastric antrum; multiple biopsies The duodenal mucosa showed no abnormalities in the bulb and second portion of the duodenum  .  ESOPHAGOGASTRODUODENOSCOPY (EGD) WITH PROPOFOL N/A 11/20/2015   Dr. [name]: normal with normal biopsies, negative H.pylori   . ESOPHAGOGASTRODUODENOSCOPY (EGD) WITH PROPOFOL N/A 04/18/2016   Procedure: ESOPHAGOGASTRODUODENOSCOPY (EGD) WITH PROPOFOL;  Surgeon: [name], MD;  [facility];  Service: Endoscopy;  Laterality: N/A;  . FLEXIBLE SIGMOIDOSCOPY N/A 04/18/2016   Procedure:  FLEXIBLE SIGMOIDOSCOPY;  Surgeon: [name], MD;  [facility];  Service: Endoscopy;  Laterality: N/A;  . TONSILLECTOMY      Family History  Problem Relation Age of Onset  . Asthma Mother   . Ulcers Mother   . Bipolar disorder Mother   . ADD / ADHD Father   . Diabetes Maternal Grandmother   . Hypertension Maternal Grandmother   . Sleep apnea Maternal Grandmother   . Heart disease Maternal Grandmother   . Diabetes Maternal Grandfather   . Hypertension Maternal Grandfather   . Sleep apnea Maternal Grandfather   . Heart disease Maternal Grandfather   . Cancer Maternal Grandfather        prostate  . Cancer Maternal Aunt        Leukemia  . Colon cancer Neg Hx   . Liver disease Neg Hx     Social History   Socioeconomic History  . Marital status: Single    Spouse name: Not on file  . Number of children: 1  . Years of education: 12th  . Highest education level: Not on file  Occupational History    Employer: NOT EMPLOYED    Comment: disabled  Tobacco Use  . Smoking status: Former Smoker    Packs/day: 0.00    Years: 2.00    Pack years: 0.00    Quit date: 10/06/2012    Years since quitting: 7.7  . Smokeless tobacco: Never Used  . Tobacco comment: Never smoked cigarettes  Vaping Use  . Vaping Use: Never used  Substance and Sexual Activity  . Alcohol use: No    Alcohol/week: 0.0 standard drinks    Comment: denies usage  . Drug use: No  . Sexual activity: Yes    Birth control/protection: None  Other Topics Concern  . Not on file  Social History Narrative   Lives with mother   Cory Roughen and Elenor Legato in the home.   Caffeine Use: occasionally   Diet: eats all food groups   Water: 6-8 cups daily       Likes to game late and sleep in until noon   [facility]      Does not drive   Wears seat belt    Smoke detectors    No weapons   Social Determinants of Health   Financial Resource Strain: Low Risk   . Difficulty of Paying  Living Expenses: Not hard at all  Food Insecurity: No Food Insecurity  . Worried About Charity fundraiser in the Last Year: Never true  . Ran Out of Food in the Last Year: Never true  Transportation Needs: No Transportation Needs  . Lack of Transportation (Medical): No  . Lack of Transportation (Non-Medical): No  Physical Activity: Inactive  . Days of Exercise per Week: 0 days  . Minutes of Exercise per Session: 0 min  Stress: Stress Concern Present  . Feeling of Stress : To some extent  Social Connections: Moderately Isolated  . Frequency of Communication with Friends and Family: More than three times a week  . Frequency of Social Gatherings with Friends and Family: Twice a  week  . Attends Religious Services: 1 to 4 times per year  . Active Member of Clubs or Organizations: No  . Attends Archivist Meetings: Never  . Marital Status: Separated  Intimate Partner Violence: Not At Risk  . Fear of Current or Ex-Partner: No  . Emotionally Abused: No  . Physically Abused: No  . Sexually Abused: No    Outpatient Medications Prior to Visit  Medication Sig Dispense Refill  . Butalbital-APAP-Caffeine 50-300-40 MG CAPS Take by mouth.    . ondansetron (ZOFRAN-ODT) 4 MG disintegrating tablet Take by mouth.    Marland Kitchen albuterol (PROVENTIL) (2.5 MG/3ML) 0.083% nebulizer solution Inhale 3 mLs (2.5 mg total) into the lungs every 6 (six) hours as needed for wheezing or shortness of breath. 60 vial 2  . albuterol (VENTOLIN HFA) 108 (90 Base) MCG/ACT inhaler Inhale into the lungs.    . cholecalciferol (VITAMIN D) 1000 units tablet Take 1 tablet (1,000 Units total) by mouth daily. START AFTER VITAMIN D 50,000 U TABLETS ARE COMPLETE 30 tablet 11  . EPINEPHrine 0.3 mg/0.3 mL IJ SOAJ injection Inject 0.3 mg into the muscle once as needed (for severe allergic reaction).    . fluticasone (FLONASE) 50 MCG/ACT nasal spray Place 1 spray into both nostrils daily. 16 g 0  . loratadine (CLARITIN) 10 MG  tablet Take 1 tablet (10 mg total) by mouth daily. 90 tablet 1  . montelukast (SINGULAIR) 10 MG tablet Take 1 tablet (10 mg total) by mouth at bedtime. 90 tablet 1  . propranolol (INDERAL) 10 MG tablet Take 1 tablet by mouth 3 (three) times daily.  2  . Respiratory Therapy Supplies (NEBULIZER COMPRESSOR) KIT And supplies 1 each 0  . traZODone (DESYREL) 150 MG tablet Take 1 tablet (150 mg total) by mouth at bedtime. 30 tablet 1   No facility-administered medications prior to visit.    Allergies  Allergen Reactions  . Nsaids Other (See Comments)    Should not take due to GI condition  . Other Other (See Comments)    Should not take 2/2 GI condition Other reaction(s): Other (See Comments) Should not take due to GI condition  . Pineapple Swelling and Other (See Comments)    Reaction:  Lip swelling  . Remicade [Infliximab] Other (See Comments)    AUTOIMMUNE HEPATITIS  . Strawberry Extract Swelling    Reaction:  Lip swelling  . Amoxicillin-Pot Clavulanate Nausea And Vomiting and Other (See Comments)    GI intolerance   . Omeprazole Nausea And Vomiting  . Tomato Rash    Review of Systems  Constitutional: Negative for chills and fever.  HENT: Negative for congestion and sore throat.   Eyes: Negative for pain and discharge.  Respiratory: Negative for cough and shortness of breath.   Cardiovascular: Negative for chest pain and palpitations.  Gastrointestinal: Negative for constipation, diarrhea, nausea and vomiting.  Endocrine: Negative for polydipsia and polyuria.  Genitourinary: Negative for dysuria and hematuria.  Musculoskeletal: Negative for neck pain and neck stiffness.  Skin: Negative for rash.  Neurological: Positive for headaches. Negative for dizziness, weakness and numbness.  Psychiatric/Behavioral: Positive for behavioral problems and decreased concentration. The patient is nervous/anxious.        Objective:    Physical Exam Vitals reviewed.  Constitutional:       General: He is not in acute distress.    Appearance: He is not diaphoretic.  HENT:     Head: Normocephalic and atraumatic.     Nose: Nose normal.  Mouth/Throat:     Mouth: Mucous membranes are moist.  Eyes:     General: No scleral icterus.    Extraocular Movements: Extraocular movements intact.     Pupils: Pupils are equal, round, and reactive to light.  Cardiovascular:     Rate and Rhythm: Normal rate and regular rhythm.     Pulses: Normal pulses.     Heart sounds: No murmur heard.   Pulmonary:     Breath sounds: Normal breath sounds. No wheezing or rales.  Musculoskeletal:     Cervical back: Neck supple. No tenderness.     Right lower leg: No edema.     Left lower leg: No edema.  Skin:    General: Skin is warm.     Findings: No rash.  Neurological:     General: No focal deficit present.     Mental Status: He is alert and oriented to person, place, and time.  Psychiatric:        Mood and Affect: Mood normal.     BP (!) 142/88 (BP Location: Right Arm, Patient Position: Sitting, Cuff Size: Normal)   Pulse 79   Temp 98.9 F (37.2 C) (Oral)   Resp 18   Ht 5' 8.5" (1.74 m)   Wt 289 lb 6.4 oz (131.3 kg)   SpO2 95%   BMI 43.36 kg/m  Wt Readings from Last 3 Encounters:  07/19/20 289 lb 6.4 oz (131.3 kg)  07/05/20 250 lb (113.4 kg)  04/04/20 273 lb (123.8 kg)    Health Maintenance Due  Topic Date Due  . COVID-19 Vaccine (1) Never done    There are no preventive care reminders to display for this patient.   Lab Results  Component Value Date   TSH 2.909 03/24/2016   Lab Results  Component Value Date   WBC 7.1 04/04/2020   HGB 13.7 04/04/2020   HCT 40.6 04/04/2020   MCV 84 04/04/2020   PLT 238 04/04/2020   Lab Results  Component Value Date   NA 141 04/04/2020   K 4.3 04/04/2020   CO2 19 (L) 04/04/2020   GLUCOSE 94 04/04/2020   BUN 7 04/04/2020   CREATININE 0.97 04/04/2020   BILITOT 0.3 04/04/2020   ALKPHOS 95 04/04/2020   AST 26 04/04/2020    ALT 23 04/04/2020   PROT 6.9 04/04/2020   ALBUMIN 4.3 04/04/2020   CALCIUM 9.5 04/04/2020   ANIONGAP 7 03/08/2018   Lab Results  Component Value Date   CHOL 153 05/05/2017   Lab Results  Component Value Date   HDL 37 (L) 05/05/2017   Lab Results  Component Value Date   LDLCALC 90 05/05/2017   Lab Results  Component Value Date   TRIG 164 (H) 05/05/2017   Lab Results  Component Value Date   CHOLHDL 4.1 05/05/2017   Lab Results  Component Value Date   HGBA1C 5.8 (H) 04/04/2020       Assessment & Plan:   ER visit follow up ER chart reviewed Medications reconciled  High BP Currently stable Episodes of high BP likely related to migraine and/or anxiety  Migraine Continue current treatment F/u Neurology Avoid excessive light and noise  Ulcerative colitis Has had partial colectomy Does not have GI follow up Advised to contact [place] office for follow up   No orders of the defined types were placed in this encounter.    Lindell Spar, MD

## 2020-08-15 NOTE — Progress Notes (Deleted)
NEUROLOGY CONSULTATION NOTE  [place] WHEDBEE MRN: [id number] DOB: 11-05-1990  Referring provider: Cherly Beach, NP Primary care provider: Cherly Beach, NP  Reason for consult:  ***  Assessment/Plan:   ***   Subjective:  Wayne Avila is a 30 year old ***-handed male with Ulcerative Colitis, Bipolar 1 disorder, ADHD, anxiety/depression, asthma, mental developmental delay and history of seizures who presents for migraines.  History supplemented by ED and referring provider's notes.  Reports migraines since ***.  They are severe *** headaches in the *** regions.  Associated nausea, photophobia, phonophobia, sometimes vomiting.  No preceding aura or visual disturbance.  They last *** and occur ***.  Triggers include.  Relieving factors include ***.  Treats with Tylenol ***.  He was seen in the [facility] ED in February for migraine for which he received headache cocktail.  History of seizure-like episodes since February 2014.  Episodes described as staring spell followed by loss of consciousness and convulsion.  Often occur during time of stress or feeling upset, such as during an argument, but not always.  Has had workup by neurology in 2015.   MRI and CT head normal.  Routine EEG normal.  Was evaluated at the EMU at [facility] for 2-3 days - no events captured but EEG was normal.  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    He has had multiple imaging of the head.  CT head on 10/20/2010, MRI of brain on 04/03/2014 and CT head on 08/27/2017 were personally reviewed and were unremarkable.  Current NSAIDS/analgesics:  Tylenol.  NSAIDs contraindicated due to UC. Current triptans:  none Current ergotamine:  none Current anti-emetic:  none Current muscle relaxants:  none Current Antihypertensive medications:  Propranolol 59m TID Current Antidepressant medications:  trazodone Current Anticonvulsant medications:  none Current anti-CGRP:  none Current  Vitamins/Herbal/Supplements:  none Current Antihistamines/Decongestants:  Claritin, Flonase Other therapy:  *** Hormone/birth control:  none  Past NSAIDS/analgesics:  Ibuprofen, tramadol Past abortive triptans:  none Past abortive ergotamine:  none Past muscle relaxants:  Flexeril Past anti-emetic:  Zofran, Reglan, Phenergan Past antihypertensive medications:  clonidine Past antidepressant medications:  sertraline Past anticonvulsant medications:  Depakote, Trileptal Past anti-CGRP:  none Past vitamins/Herbal/Supplements:  none Past antihistamines/decongestants:  hydroxyzine Other past therapies:  ***  Caffeine:  *** Alcohol:  *** Smoker:  *** Diet:  *** Exercise:  *** Depression:  ***; Anxiety:  *** Other pain:  *** Sleep hygiene:  *** Family history of headache:  ***      PAST MEDICAL HISTORY: Past Medical History:  Diagnosis Date  . ADHD (attention deficit hyperactivity disorder)   . Anxiety   . Asthma   . [place]immune [place] ([place]   . Bipolar 1 disorder ([place]   . Bipolar 1 disorder, manic, moderate ([place] 11/09/2014  . C. difficile colitis 03/22/2016  . Clostridium difficile colitis   . Depression   . GERD (gastroesophageal reflux disease)   . HOH (hard of hearing)   . Mental developmental delay   . Seizures ([place]   . Ulcerative colitis ([place]     PAST SURGICAL HISTORY: Past Surgical History:  Procedure Laterality Date  . ADENOIDECTOMY    . BIOPSY N/A 12/07/2012   Procedure: GASTRIC BIOPSIES;  Surgeon: SDanie Binder MD;  Location[facility];  Service: Endoscopy;  Laterality: N/A;  . BIOPSY N/A 08/30/2013   Procedure: BIOPSY;  Surgeon: SDanie Binder MD;  Location: [facility];  Service: Endoscopy;  Laterality: N/A;  right colon,transverse colon,  left colon, rectal biopsies  . BIOPSY  11/20/2015   Procedure: BIOPSY;  Surgeon: [name], MD;  [facility];  Service: Endoscopy;;  ileal, right colon biopsy, left colon, rectum  . BIOPSY  04/18/2016    Procedure: BIOPSY;  Surgeon: [name], MD;  [facility];  Service: Endoscopy;;  left descending colon biopsies  . COLONOSCOPY  11/20/2015   Dr. [name]: Severe erythema, edema, ulcers from the anal verge to 20 cm above the anal verge without mucosal sparing, remaining colon and terminal ileum appeared normal. Biopsies from the rectum revealed fulminant active chronic colitis consistent with IBD. Pathology from terminal ileum revealed intramucosal lymphoid aggregates. Remaining colon random biopsies with inactive chronic colitis  . COLONOSCOPY WITH PROPOFOL N/A 08/30/2013   PPH:KFEXMD mucosa in the terminal ileum/COLITIS/ MILD PROCTITIS. 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  Marland Kitchen COLONOSCOPY WITH PROPOFOL N/A 11/20/2015   Dr. 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 ESOPHAGOGASTRODUODENOSCOPY  11/20/2015   Dr. [name]: Normal exam, stomach biopsied and duodenal biopsy.. Benign biopsies.  . ESOPHAGOGASTRODUODENOSCOPY (EGD) WITH PROPOFOL N/A 12/07/2012   SLF:The mucosa of the esophagus appeared normal Non-erosive gastritis (inflammation) was found in the gastric antrum; multiple biopsies The duodenal mucosa showed no abnormalities in the bulb and second portion of the duodenum  . ESOPHAGOGASTRODUODENOSCOPY (EGD) WITH PROPOFOL N/A 11/20/2015   Dr. [name]: normal with normal biopsies, negative H.pylori   . ESOPHAGOGASTRODUODENOSCOPY (EGD) WITH PROPOFOL N/A 04/18/2016   Procedure: ESOPHAGOGASTRODUODENOSCOPY (EGD) WITH PROPOFOL;  Surgeon: [name], MD;  [facility];  Service: Endoscopy;  Laterality: N/A;  . FLEXIBLE SIGMOIDOSCOPY N/A 04/18/2016   Procedure: FLEXIBLE SIGMOIDOSCOPY;  Surgeon: [name], MD;  [facility];  Service: Endoscopy;  Laterality: N/A;  . TONSILLECTOMY      MEDICATIONS: Current  Outpatient Medications on File Prior to Visit  Medication Sig Dispense Refill  . albuterol (PROVENTIL) (2.5 MG/3ML) 0.083% nebulizer solution Inhale 3 mLs (2.5 mg total) into the lungs every 6 (six) hours as needed for wheezing or shortness of breath. 60 vial 2  . albuterol (VENTOLIN HFA) 108 (90 Base) MCG/ACT inhaler Inhale into the lungs.    . Butalbital-APAP-Caffeine 50-300-40 MG CAPS Take by mouth.    . cholecalciferol (VITAMIN D) 1000 units tablet Take 1 tablet (1,000 Units total) by mouth daily. START AFTER VITAMIN D 50,000 U TABLETS ARE COMPLETE 30 tablet 11  . EPINEPHrine 0.3 mg/0.3 mL IJ SOAJ injection Inject 0.3 mg into the muscle once as needed (for severe allergic reaction).    . fluticasone (FLONASE) 50 MCG/ACT nasal spray Place 1 spray into both nostrils daily. 16 g 0  . loratadine (CLARITIN) 10 MG tablet Take 1 tablet (10 mg total) by mouth daily. 90 tablet 1  . montelukast (SINGULAIR) 10 MG tablet Take 1 tablet (10 mg total) by mouth at bedtime. 90 tablet 1  . pantoprazole (PROTONIX) 20 MG tablet Take 1 tablet (20 mg total) by mouth daily. 30 tablet 1  . propranolol (INDERAL) 10 MG tablet Take 1 tablet by mouth 3 (three) times daily.  2  . Respiratory Therapy Supplies (NEBULIZER COMPRESSOR) KIT And supplies 1 each 0  . traZODone (DESYREL) 150 MG tablet Take 1 tablet (150 mg total) by mouth at bedtime. 30 tablet 1   No current facility-administered medications on file prior to visit.  ALLERGIES: Allergies  Allergen Reactions  . Nsaids Other (See Comments)    Should not take due to GI condition  . Other Other (See Comments)    Should not take 2/2 GI condition Other reaction(s): Other (See Comments) Should not take due to GI condition  . Pineapple Swelling and Other (See Comments)    Reaction:  Lip swelling  . Remicade [Infliximab] Other (See Comments)    AUTOIMMUNE HEPATITIS  . Strawberry Extract Swelling    Reaction:  Lip swelling  . Amoxicillin-Pot Clavulanate  Nausea And Vomiting and Other (See Comments)    GI intolerance   . Omeprazole Nausea And Vomiting  . Tomato Rash    FAMILY HISTORY: Family History  Problem Relation Age of Onset  . Asthma Mother   . Ulcers Mother   . Bipolar disorder Mother   . ADD / ADHD Father   . Diabetes Maternal Grandmother   . Hypertension Maternal Grandmother   . Sleep apnea Maternal Grandmother   . Heart disease Maternal Grandmother   . Diabetes Maternal Grandfather   . Hypertension Maternal Grandfather   . Sleep apnea Maternal Grandfather   . Heart disease Maternal Grandfather   . Cancer Maternal Grandfather        prostate  . Cancer Maternal Aunt        Leukemia  . Colon cancer Neg Hx   . Liver disease Neg Hx     Objective:  *** General: No acute distress.  Patient appears well-groomed.   Head:  Normocephalic/atraumatic Eyes:  fundi examined but not visualized Neck: supple, no paraspinal tenderness, full range of motion Back: No paraspinal tenderness Heart: regular rate and rhythm Lungs: Clear to auscultation bilaterally. Vascular: No carotid bruits. Neurological Exam: Mental status: alert and oriented to person, place, and time, recent and remote memory intact, fund of knowledge intact, attention and concentration intact, speech fluent and not dysarthric, language intact. Cranial nerves: CN I: not tested CN II: pupils equal, round and reactive to light, visual fields intact CN III, IV, VI:  full range of motion, no nystagmus, no ptosis CN V: facial sensation intact. CN VII: upper and lower face symmetric CN VIII: hearing intact CN IX, X: gag intact, uvula midline CN XI: sternocleidomastoid and trapezius muscles intact CN XII: tongue midline Bulk & Tone: normal, no fasciculations. Motor:  muscle strength 5/5 throughout Sensation:  Pinprick, temperature and vibratory sensation intact. Deep Tendon Reflexes:  2+ throughout,  toes downgoing.   Finger to nose testing:  Without dysmetria.    Heel to shin:  Without dysmetria.   Gait:  Normal station and stride.  Romberg negative.    Thank you for allowing me to take part in the care of this patient.  Metta Clines, DO  CC: ***

## 2020-08-16 ENCOUNTER — Ambulatory Visit: Payer: Medicaid Other | Admitting: Neurology

## 2020-08-20 ENCOUNTER — Other Ambulatory Visit: Payer: Self-pay

## 2020-08-20 DIAGNOSIS — K219 Gastro-esophageal reflux disease without esophagitis: Secondary | ICD-10-CM

## 2020-08-20 MED ORDER — PANTOPRAZOLE SODIUM 20 MG PO TBEC: 20.0000 mg | DELAYED_RELEASE_TABLET | Freq: Every day | ORAL | 0 refills | Status: DC

## 2020-09-13 DIAGNOSIS — B349 Viral infection, unspecified: Principal | ICD-10-CM

## 2020-09-13 MED ORDER — FLUTICASONE PROPIONATE 50 MCG/ACTUATION NASAL SPRAY,SUSPENSION
Freq: Every day | NASAL | 0 refills | 0.00000 days | Status: CP
Start: 2020-09-13 — End: 2021-09-13

## 2020-09-13 MED ORDER — PREDNISONE 20 MG TABLET
ORAL_TABLET | 0 refills | 0 days | Status: CP
Start: 2020-09-13 — End: ?

## 2020-09-14 ENCOUNTER — Ambulatory Visit: Admit: 2020-09-14 | Discharge: 2020-09-14 | Disposition: A | Discharge status: Home / Self Care | Payer: MEDICAID

## 2020-09-14 ENCOUNTER — Emergency Department: Admit: 2020-09-14 | Discharge: 2020-09-14 | Disposition: A | Discharge status: Home / Self Care | Payer: MEDICAID

## 2020-09-14 ENCOUNTER — Telehealth: Payer: Medicaid Other | Admitting: Nurse Practitioner

## 2020-10-04 ENCOUNTER — Encounter: Payer: Self-pay | Admitting: Nurse Practitioner

## 2020-10-04 ENCOUNTER — Ambulatory Visit (INDEPENDENT_AMBULATORY_CARE_PROVIDER_SITE_OTHER): Payer: Medicaid Other | Admitting: Nurse Practitioner

## 2020-10-04 ENCOUNTER — Other Ambulatory Visit: Payer: Self-pay

## 2020-10-04 VITALS — BP 117/78 | HR 69 | Temp 97.8°F | Resp 20 | Ht 68.5 in | Wt 271.0 lb

## 2020-10-04 DIAGNOSIS — F319 Bipolar disorder, unspecified: Secondary | ICD-10-CM

## 2020-10-04 DIAGNOSIS — Z0000 Encounter for general adult medical examination without abnormal findings: Secondary | ICD-10-CM | POA: Diagnosis not present

## 2020-10-04 DIAGNOSIS — Z9109 Other allergy status, other than to drugs and biological substances: Secondary | ICD-10-CM | POA: Diagnosis not present

## 2020-10-04 DIAGNOSIS — K219 Gastro-esophageal reflux disease without esophagitis: Secondary | ICD-10-CM

## 2020-10-04 DIAGNOSIS — K51019 Ulcerative (chronic) pancolitis with unspecified complications: Secondary | ICD-10-CM

## 2020-10-04 DIAGNOSIS — R946 Abnormal results of thyroid function studies: Secondary | ICD-10-CM

## 2020-10-04 MED ORDER — LORATADINE 10 MG PO TABS: 10.0000 mg | ORAL_TABLET | Freq: Every day | ORAL | 1 refills | Status: DC

## 2020-10-04 MED ORDER — MONTELUKAST SODIUM 10 MG PO TABS
10.0000 mg | ORAL_TABLET | Freq: Every day | ORAL | 1 refills | Status: DC
Start: 2020-10-04 — End: 2021-06-04

## 2020-10-04 MED ORDER — EPINEPHRINE 0.3 MG/0.3ML IJ SOAJ: 0.3000 mg | Freq: Once | INTRAMUSCULAR | 1 refills | Status: AC | PRN

## 2020-10-04 MED ORDER — PANTOPRAZOLE SODIUM 20 MG PO TBEC: 20.0000 mg | DELAYED_RELEASE_TABLET | Freq: Every day | ORAL | 0 refills | Status: DC

## 2020-10-04 NOTE — Patient Instructions (Signed)
Please have fasting labs drawn 2-3 days prior to your appointment so we can discuss the results during your office visit.  

## 2020-10-04 NOTE — Assessment & Plan Note (Signed)
-  he is no longer taking sertraline, invega, or ADHD medication -he was being seen at [facility], unsure why the is no longer under their care -has multiple mental diagnoses including bipolar, ADHD, and developmental delay -refer to psych for med management

## 2020-10-04 NOTE — Progress Notes (Signed)
Established Patient Office Visit  Subjective:  Patient ID: Wayne Avila, male    DOB: 1991-04-18  Age: 30 y.o. MRN: [id number]  CC:  Chief Complaint  Patient presents with  . Gastroesophageal Reflux    HPI Wayne Avila presents for follow-up visit.  He was seen 3 months ago and referred to GI for ulcerative colitis.  He had surgery in the past, and local GI doctors will not accept him as a patient d/t his previous surgeries.  He states he was told he needs a GI doc at [facility] for his UC.  He has not got in contact with psychiatry related to his bipolar disorder.  Past Medical History:  Diagnosis Date  . ADHD (attention deficit hyperactivity disorder)   . Anxiety   . Asthma   . Autoimmune hepatitis ([place])   . Bipolar 1 disorder ([place])   . Bipolar 1 disorder, manic, moderate ([place]) 11/09/2014  . C. difficile colitis 03/22/2016  . Clostridium difficile colitis   . Depression   . GERD (gastroesophageal reflux disease)   . HOH (hard of hearing)   . Mental developmental delay   . Seizures ([place])   . Ulcerative colitis [facility])     Past Surgical History:  Procedure Laterality Date  . ADENOIDECTOMY    . BIOPSY N/A 12/07/2012   Procedure: GASTRIC BIOPSIES;  Surgeon: [name], MD;  Location: [facility] OR[facility];  Service: Endoscopy;  Laterality: N/A;  . BIOPSY N/A 08/30/2013   Procedure: BIOPSY;  Surgeon: [name], MD;  Location: [facility];  Service: Endoscopy;  Laterality: N/A;  right colon,transverse colon, left colon, rectal biopsies  . BIOPSY  11/20/2015   Procedure: BIOPSY;  Surgeon: [name], MD;  Location: [facility];  Service: Endoscopy;;  ileal, right colon biopsy, left colon, rectum  . BIOPSY  04/18/2016   Procedure: BIOPSY;  Surgeon: [name], MD;  Location: [facility];  Service: Endoscopy;;  left descending colon biopsies  . COLONOSCOPY  11/20/2015   Dr. [name]: Severe erythema, edema, ulcers from the anal verge to 20 cm above the anal verge without  mucosal sparing, remaining colon and terminal ileum appeared normal. Biopsies from the rectum revealed fulminant active chronic colitis consistent with IBD. Pathology from terminal ileum revealed intramucosal lymphoid aggregates. Remaining colon random biopsies with inactive chronic colitis  . COLONOSCOPY WITH PROPOFOL N/A 08/30/2013   LXB:WIOMBT mucosa in the terminal ileum/COLITIS/ MILD PROCTITIS. 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  Marland Kitchen COLONOSCOPY WITH PROPOFOL N/A 11/20/2015   Dr. 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 ESOPHAGOGASTRODUODENOSCOPY  11/20/2015   Dr. [name]: Normal exam, stomach biopsied and duodenal biopsy.. Benign biopsies.  . ESOPHAGOGASTRODUODENOSCOPY (EGD) WITH PROPOFOL N/A 12/07/2012   SLF:The mucosa of the esophagus appeared normal Non-erosive gastritis (inflammation) was found in the gastric antrum; multiple biopsies The duodenal mucosa showed no abnormalities in the bulb and second portion of the duodenum  . ESOPHAGOGASTRODUODENOSCOPY (EGD) WITH PROPOFOL N/A 11/20/2015   Dr. [name]: normal with normal biopsies, negative H.pylori   . ESOPHAGOGASTRODUODENOSCOPY (EGD) WITH PROPOFOL N/A 04/18/2016   Procedure: ESOPHAGOGASTRODUODENOSCOPY (EGD) WITH PROPOFOL;  Surgeon: [name], MD;  [facility];  Service: Endoscopy;  Laterality: N/A;  . FLEXIBLE SIGMOIDOSCOPY N/A 04/18/2016   Procedure: FLEXIBLE SIGMOIDOSCOPY;  Surgeon: [name], MD;  [facility];  Service: Endoscopy;  Laterality: N/A;  . TONSILLECTOMY      Family History  Problem Relation Age of Onset  . Asthma Mother   . Ulcers Mother   . Bipolar disorder Mother   . ADD / ADHD Father   . Diabetes Maternal Grandmother   . Hypertension Maternal Grandmother   . Sleep apnea Maternal Grandmother   . Heart disease Maternal Grandmother    . Diabetes Maternal Grandfather   . Hypertension Maternal Grandfather   . Sleep apnea Maternal Grandfather   . Heart disease Maternal Grandfather   . Cancer Maternal Grandfather        prostate  . Cancer Maternal Aunt        Leukemia  . Colon cancer Neg Hx   . Liver disease Neg Hx     Social History   Socioeconomic History  . Marital status: Single    Spouse name: Not on file  . Number of children: 1  . Years of education: 12th  . Highest education level: Not on file  Occupational History    Employer: NOT EMPLOYED    Comment: disabled  Tobacco Use  . Smoking status: Former Smoker    Packs/day: 0.00    Years: 2.00    Pack years: 0.00    Quit date: 10/06/2012    Years since quitting: 8.0  . Smokeless tobacco: Never Used  . Tobacco comment: Never smoked cigarettes  Vaping Use  . Vaping Use: Never used  Substance and Sexual Activity  . Alcohol use: No    Alcohol/week: 0.0 standard drinks    Comment: denies usage  . Drug use: No  . Sexual activity: Yes    Birth control/protection: None  Other Topics Concern  . Not on file  Social History Narrative   Lives with mother   Cory Roughen and Elenor Legato in the home.   Caffeine Use: occasionally   Diet: eats all food groups   Water: 6-8 cups daily       Likes to game late and sleep in until noon   [facility]      Does not drive   Wears seat belt    Smoke detectors    No weapons   Social Determinants of Health   Financial Resource Strain: Low Risk   . Difficulty of Paying Living Expenses: Not hard at all  Food Insecurity: No Food Insecurity  . Worried About Charity fundraiser in the Last Year: Never true  . Ran Out of Food in the Last Year: Never true  Transportation Needs: No Transportation Needs  . Lack of Transportation (Medical): No  . Lack of Transportation (Non-Medical): No  Physical Activity: Inactive  . Days of Exercise per Week: 0 days  . Minutes of Exercise per Session: 0 min   Stress: Stress Concern Present  . Feeling of Stress : To some extent  Social Connections: Moderately Isolated  . Frequency of Communication with Friends and Family: More than three times a week  . Frequency of Social Gatherings with Friends and Family: Twice a week  . Attends Religious Services: 1 to 4 times per year  . Active Member of Clubs or Organizations: No  . Attends Archivist Meetings: Never  . Marital Status: Separated  Intimate Partner Violence: Not At Risk  . Fear of Current or Ex-Partner: No  . Emotionally Abused: No  . Physically Abused: No  . Sexually Abused: No    Outpatient Medications Prior to Visit  Medication Sig  Dispense Refill  . albuterol (PROVENTIL) (2.5 MG/3ML) 0.083% nebulizer solution Inhale 3 mLs (2.5 mg total) into the lungs every 6 (six) hours as needed for wheezing or shortness of breath. 60 vial 2  . albuterol (VENTOLIN HFA) 108 (90 Base) MCG/ACT inhaler Inhale into the lungs.    . Butalbital-APAP-Caffeine 50-300-40 MG CAPS Take by mouth.    . cholecalciferol (VITAMIN D) 1000 units tablet Take 1 tablet (1,000 Units total) by mouth daily. START AFTER VITAMIN D 50,000 U TABLETS ARE COMPLETE 30 tablet 11  . fluticasone (FLONASE) 50 MCG/ACT nasal spray Place 1 spray into both nostrils daily. 16 g 0  . propranolol (INDERAL) 10 MG tablet Take 1 tablet by mouth 3 (three) times daily.  2  . Respiratory Therapy Supplies (NEBULIZER COMPRESSOR) KIT And supplies 1 each 0  . traZODone (DESYREL) 150 MG tablet Take 1 tablet (150 mg total) by mouth at bedtime. 30 tablet 1  . EPINEPHrine 0.3 mg/0.3 mL IJ SOAJ injection Inject 0.3 mg into the muscle once as needed (for severe allergic reaction).    Marland Kitchen loratadine (CLARITIN) 10 MG tablet Take 1 tablet (10 mg total) by mouth daily. 90 tablet 1  . montelukast (SINGULAIR) 10 MG tablet Take 1 tablet (10 mg total) by mouth at bedtime. 90 tablet 1  . pantoprazole (PROTONIX) 20 MG tablet Take 1 tablet (20 mg total) by  mouth daily. 90 tablet 0   No facility-administered medications prior to visit.    Allergies  Allergen Reactions  . Nsaids Other (See Comments)    Should not take due to GI condition  . Other Other (See Comments)    Should not take 2/2 GI condition Other reaction(s): Other (See Comments) Should not take due to GI condition  . Pineapple Swelling and Other (See Comments)    Reaction:  Lip swelling  . Remicade [Infliximab] Other (See Comments)    AUTOIMMUNE HEPATITIS  . Strawberry Extract Swelling    Reaction:  Lip swelling  . Amoxicillin-Pot Clavulanate Nausea And Vomiting and Other (See Comments)    GI intolerance   . Omeprazole Nausea And Vomiting  . Tomato Rash    ROS Review of Systems  Constitutional: Negative.   Respiratory: Negative.   Cardiovascular: Negative.   Gastrointestinal: Negative.   Psychiatric/Behavioral: Negative.       Objective:    Physical Exam Constitutional:      Appearance: Normal appearance. He is obese.  Cardiovascular:     Rate and Rhythm: Normal rate and regular rhythm.     Pulses: Normal pulses.     Heart sounds: Normal heart sounds.  Pulmonary:     Effort: Pulmonary effort is normal.     Breath sounds: Normal breath sounds.  Abdominal:     General: There is no distension.     Palpations: There is no mass.     Tenderness: There is no abdominal tenderness. There is no guarding or rebound.     Hernia: No hernia is present.     Comments: Surgical scars to abdomen  Neurological:     Mental Status: He is alert.  Psychiatric:        Mood and Affect: Mood normal.        Behavior: Behavior normal.        Thought Content: Thought content normal.        Judgment: Judgment normal.     BP 117/78   Pulse 69   Temp 97.8 F (36.6 C)   Resp 20  Ht 5' 8.5" (1.74 m)   Wt 271 lb (122.9 kg)   SpO2 97%   BMI 40.61 kg/m  Wt Readings from Last 3 Encounters:  10/04/20 271 lb (122.9 kg)  07/19/20 289 lb 6.4 oz (131.3 kg)  07/05/20 250 lb  (113.4 kg)     Health Maintenance Due  Topic Date Due  . COVID-19 Vaccine (3 - Booster for Moderna series) 07/06/2020    There are no preventive care reminders to display for this patient.  Lab Results  Component Value Date   TSH 2.909 03/24/2016   Lab Results  Component Value Date   WBC 7.1 04/04/2020   HGB 13.7 04/04/2020   HCT 40.6 04/04/2020   MCV 84 04/04/2020   PLT 238 04/04/2020   Lab Results  Component Value Date   NA 141 04/04/2020   K 4.3 04/04/2020   CO2 19 (L) 04/04/2020   GLUCOSE 94 04/04/2020   BUN 7 04/04/2020   CREATININE 0.97 04/04/2020   BILITOT 0.3 04/04/2020   ALKPHOS 95 04/04/2020   AST 26 04/04/2020   ALT 23 04/04/2020   PROT 6.9 04/04/2020   ALBUMIN 4.3 04/04/2020   CALCIUM 9.5 04/04/2020   ANIONGAP 7 03/08/2018   Lab Results  Component Value Date   CHOL 153 05/05/2017   Lab Results  Component Value Date   HDL 37 (L) 05/05/2017   Lab Results  Component Value Date   LDLCALC 90 05/05/2017   Lab Results  Component Value Date   TRIG 164 (H) 05/05/2017   Lab Results  Component Value Date   CHOLHDL 4.1 05/05/2017   Lab Results  Component Value Date   HGBA1C 5.8 (H) 04/04/2020      Assessment & Plan:   Problem List Items Addressed This Visit      Digestive   Ulcerative colitis with complication ([place])   Relevant Orders   Ambulatory referral to Gastroenterology     Other   Abnormal thyroid function test   Relevant Orders   TSH + free T4   Environmental allergies   Relevant Medications   loratadine (CLARITIN) 10 MG tablet   montelukast (SINGULAIR) 10 MG tablet   Bipolar 1 disorder ([place]) - Primary    -he is no longer taking sertraline, invega, or ADHD medication -he was being seen at [facility], unsure why the is no longer under their care -has multiple mental diagnoses including bipolar, ADHD, and developmental delay -refer to psych for med management      Relevant Orders   Ambulatory referral to Psychiatry     Other Visit Diagnoses    Gastroesophageal reflux disease, unspecified whether esophagitis present       Relevant Medications   pantoprazole (PROTONIX) 20 MG tablet   General medical exam       Relevant Orders   CBC with Differential/Platelet   CMP14+EGFR   Lipid Panel With LDL/HDL Ratio      Meds ordered this encounter  Medications  . EPINEPHrine 0.3 mg/0.3 mL IJ SOAJ injection    Sig: Inject 0.3 mg into the muscle once as needed (for severe allergic reaction).    Dispense:  1 each    Refill:  1  . loratadine (CLARITIN) 10 MG tablet    Sig: Take 1 tablet (10 mg total) by mouth daily.    Dispense:  90 tablet    Refill:  1  . pantoprazole (PROTONIX) 20 MG tablet    Sig: Take 1 tablet (20 mg total) by mouth  daily.    Dispense:  90 tablet    Refill:  0  . montelukast (SINGULAIR) 10 MG tablet    Sig: Take 1 tablet (10 mg total) by mouth at bedtime.    Dispense:  90 tablet    Refill:  1    Follow-up: Return in about 3 months (around 01/03/2021) for Physical Exam.    Noreene Larsson, NP

## 2020-10-09 ENCOUNTER — Other Ambulatory Visit: Payer: Self-pay

## 2020-10-09 ENCOUNTER — Ambulatory Visit: Payer: Medicaid Other

## 2020-10-09 ENCOUNTER — Telehealth: Payer: Self-pay | Admitting: Licensed Clinical Social Worker

## 2020-10-09 DIAGNOSIS — F319 Bipolar disorder, unspecified: Secondary | ICD-10-CM

## 2020-10-09 NOTE — BH Specialist Note (Signed)
[facility] Initial Clinical Assessment  MRN: [id number] NAME: Wayne Avila Date: 10/09/20  Start time:   11aEnd time:  1130a Total time:  16mn Call number:  [phone] Type of Contact:  telephone Patient consent obtained:  yes [facility] for Visit today:  [facility]  Treatment History Patient recently received Inpatient Treatment:    Facility/Program:    Date of discharge:   Patient currently being seen by therapist/psychiatrist:   Patient currently receiving the following services:    Past Psychiatric History/Hospitalization(s): Anxiety: No Bipolar Disorder: Yes Depression: No Mania: No Psychosis: No Schizophrenia: No Personality Disorder: No Hospitalization for psychiatric illness: No History of Electroconvulsive Shock Therapy: No Prior Suicide Attempts: No  Clinical Assessment:  PHQ-9 Assessments: [facility] 10/04/2020 07/19/2020 07/05/2020  Decreased Interest 0 0 0  Down, Depressed, Hopeless 0 0 0  PHQ - 2 Score 0 0 0  Some recent data might be hidden    GAD-7 Assessments: GAD 7 : Generalized Anxiety Score 04/04/2020  Nervous, Anxious, on Edge 0  Control/stop worrying 0  Worry too much - different things 0  Trouble relaxing 0  Restless 0  Easily annoyed or irritable 0  Afraid - awful might happen 0  Total GAD 7 Score 0  Anxiety Difficulty Not difficult at all     Social Functioning Social maturity:  below Social judgement:  below  Stress Current stressors:  mood swings Familial stressors:  none Sleep:  increase Appetite:  WNL Coping ability:  overwhelmed Patient taking medications as prescribed:    Current medications:  Outpatient Encounter Medications as of 10/09/2020  Medication Sig  . albuterol (PROVENTIL) (2.5 MG/3ML) 0.083% nebulizer solution Inhale 3 mLs (2.5 mg total) into the lungs every 6 (six) hours as needed for wheezing or shortness of breath.  .Marland Kitchenalbuterol (VENTOLIN HFA) 108 (90 Base) MCG/ACT inhaler  Inhale into the lungs.  . Butalbital-APAP-Caffeine 50-300-40 MG CAPS Take by mouth.  . cholecalciferol (VITAMIN D) 1000 units tablet Take 1 tablet (1,000 Units total) by mouth daily. START AFTER VITAMIN D 50,000 U TABLETS ARE COMPLETE  . EPINEPHrine 0.3 mg/0.3 mL IJ SOAJ injection Inject 0.3 mg into the muscle once as needed (for severe allergic reaction).  . fluticasone (FLONASE) 50 MCG/ACT nasal spray Place 1 spray into both nostrils daily.  .Marland Kitchenloratadine (CLARITIN) 10 MG tablet Take 1 tablet (10 mg total) by mouth daily.  . montelukast (SINGULAIR) 10 MG tablet Take 1 tablet (10 mg total) by mouth at bedtime.  . pantoprazole (PROTONIX) 20 MG tablet Take 1 tablet (20 mg total) by mouth daily.  . propranolol (INDERAL) 10 MG tablet Take 1 tablet by mouth 3 (three) times daily.  .Marland KitchenRespiratory Therapy Supplies (NEBULIZER COMPRESSOR) KIT And supplies  . traZODone (DESYREL) 150 MG tablet Take 1 tablet (150 mg total) by mouth at bedtime.   No facility-administered encounter medications on file as of 10/09/2020.    Self-harm Behaviors Risk Assessment Self-harm risk factors:   Patient endorses recent thoughts of harming self:    CMalawiSuicide Severity Rating Scale: No flowsheet data found.  Danger to Others Risk Assessment Danger to others risk factors:   Patient endorses recent thoughts of harming others:    Dynamic Appraisal of Situational Aggression (DASA):  CHL DYNAMIC APPRAISAL OF SITUATIONAL AGGRESSION (DASA) 11/09/2014 11/11/2014 11/12/2014 11/13/2014  Irritability 0 0 0 0  Impulsivity 0 0 0 0  Unwillingness to Follow Directions 0 0 0 0  Sensitivity to Perceived Provocation  1 0 0 0  Easily Angered When Requests are Denied 0 0 0 0  Negative Attitudes 0 0 0 0  Verbal Threats 0 0 0 0  Total DASA Score 1 0 0 0  Final Risk Rating Moderate Risk Low Risk Low Risk Low Risk  Physical Aggression against OBJECTS No No No No  Verbal Aggression against OTHER PEOPLE No No No No  Physical Aggression  against OTHER PEOPLE No No No No    Substance Use Assessment Patient recently consumed alcohol:    Alcohol Use Disorder Identification Test (AUDIT):  Alcohol Use Disorder Test (AUDIT) 11/09/2014 04/04/2020 06/12/2020  1. How often do you have a drink containing alcohol? 0 0 0  2. How many drinks containing alcohol do you have on a typical day when you are drinking? - 0 0  3. How often do you have six or more drinks on one occasion? - 0 0  AUDIT-C Score - 0 0  9. Have you or someone else been injured as a result of your drinking? 0 - -  10. Has a relative or friend or a doctor or another health worker been concerned about your drinking or suggested you cut down? 0 - -  Alcohol Use Disorder Identification Test Final Score (AUDIT) 0 - -  Alcohol Brief Interventions/Follow-up - AUDIT Score <7 follow-up not indicated Patient Refused   Patient recently used drugs:    Opioid Risk Assessment:  Patient is concerned about dependence or abuse of substances:    ASAM Multidimensional Assessment Summary:  Dimension 1:    Dimension 1 Rating:    Dimension 2:    Dimension 2 Rating:    Dimension 3:    Dimension 3 Rating:    Dimension 4:    Dimension 4 Rating:    Dimension 5:    Dimension 5 Rating:    Dimension 6:    Dimension 6 Rating:   ASAM's Severity Rating Score:   ASAM Recommended Level of Treatment:     Goals, Interventions and Follow-up Plan Goals: Increase healthy adjustment to current life circumstances and Increase motivation to adhere to plan of care Interventions: Behavioral Activation and CBT Cognitive Behavioral Therapy Follow-up Plan: Refer to [facility]  Summary of Clinical Assessment Summary: Wayne Avila is a 30 year old male that was referred by his PCP.  Wayne Avila was the primary responder during the interview.  Wayne Avila lives with his mother who cares for him.  Mother requesting medication for him as he was on [place], ADHD medication and [facility].  She is requesting Psychiatry services. Will refer   [place], LCSW

## 2020-10-09 NOTE — Progress Notes (Signed)
NEUROLOGY CONSULTATION NOTE  SHADOW SCHEDLER MRN: [id number] DOB: Oct 29, 1990  Referring provider: Cherly Beach, NP Primary care provider: Cherly Beach, NP  Reason for consult:  migraines  Assessment/Plan:   1.  Migraine without aura, without status migrainosus, not intractable 2.  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  Bipolar 1 disorder/mental developmental delay  1.  As migraines are infrequent, defer preventative medication 2.  For rescue:  Sumatriptan 151m with promethazine 283m  Stop Fioricet 3.  Limit use of pain relievers to no more than 2 days out of week to prevent risk of rebound or medication-overuse headache. 4.  Keep headache diary 5. Follow up 6 months.   Subjective:  KeMarciano Mundts a [age]ear old male with ADHD, Bipolar 1 disorder, ulcerative colitis and remote history of seizures (not on medication) who presents for migraines.  History supplemented by referring provider's notes.  Onset:  2-3 years ago Location:  Predominantly frontal and temporal bilaterally Quality:  pounding Intensity:  severe Aura:  no Prodrome:  no Associated symptoms:  Nausea, vomiting, photophobia, phonophobia, elevated blood pressure.  He denies associated unilateral numbness or weakness. Duration:  1 to 2 days.  With Fioricet, 15-20 days Frequency:  1 to 2 times a month Frequency of abortive medication: 2 times a month Triggers:  unsure Relieving factors:  Nothing.   Activity:  Movement aggravates migraine  CT head on 08/27/2017 to evaluate headache personally reviewed and was normal.  He had a seizure in 2015.    Remote history of seizures.  First seizure occurred in February 2014, which occurred in the middle of an argument.  Semiology staring episode, loss of consciousness and convulsions.  MRI of brain with and without contrast on 04/03/2014 personally reviewed was normal.  EEGs were normal.  [facility] but did not have an event  during stay.  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  He was previously on Depakote.  He hasn't had a recurrent spell in years.    Current NSAIDS/analgesics:  Fioricet, Tylenol Current triptans:  none Current ergotamine:  none Current anti-emetic:  none Current muscle relaxants:  none Current Antihypertensive medications:  Propranolol 1065mID Current Antidepressant medications:  Trazodone  Current Anticonvulsant medications:  none Current anti-CGRP:  none Current Vitamins/Herbal/Supplements:  D Current Antihistamines/Decongestants:  Flonase, Claritin Other therapy:  none Hormone/birth control:  none   Past NSAIDS/analgesics:  Ibuprofen, tramadol, maybe an opioid in the ED Past abortive triptans:  none Past abortive ergotamine:  none Past muscle relaxants:  Flexeril Past anti-emetic:  Zofran, Reglan, Phenergan Past antihypertensive medications:  clonidine Past antidepressant medications:  sertraline Past anticonvulsant medications:  Depakote Past anti-CGRP:  none Past vitamins/Herbal/Supplements:  none Past antihistamines/decongestants:  Benadryl, Sudafed Other past therapies:  none  Caffeine:  No coffee.  No soda Alcohol:  1 drink every 2 weeks Smoker:  1 cigarette a day Diet:  1 to 2 glasses of water.  No soda.  Does not skip meals Exercise:  no Depression:  no; Anxiety:  no Other pain:  no Sleep:  Sleeps well Family history of headache: Mom, aunt     PAST MEDICAL HISTORY: Past Medical History:  Diagnosis Date  . ADHD (attention deficit hyperactivity disorder)   . Anxiety   . Asthma   . [place] ([place] . Bipolar 1 disorder ([place] . Bipolar 1 disorder, manic, moderate (HCC[place]und[place]/07/2014  . C. difficile  colitis 03/22/2016  . Clostridium difficile colitis   . Depression   . GERD (gastroesophageal reflux disease)   . HOH (hard of hearing)   . Mental developmental delay   . Seizures ([place])   . Ulcerative colitis ([place])      PAST SURGICAL HISTORY: Past Surgical History:  Procedure Laterality Date  . ADENOIDECTOMY    . BIOPSY N/A 12/07/2012   Procedure: GASTRIC BIOPSIES;  Surgeon: [name], MD;  Location: [facility];  Service: Endoscopy;  Laterality: N/A;  . BIOPSY N/A 08/30/2013   Procedure: BIOPSY;  Surgeon: [name], MD;  Location: [facility];  Service: Endoscopy;  Laterality: N/A;  right colon,transverse colon, left colon, rectal biopsies  . BIOPSY  11/20/2015   Procedure: BIOPSY;  Surgeon: [name], MD;  Location: [facility];  Service: Endoscopy;;  ileal, right colon biopsy, left colon, rectum  . BIOPSY  04/18/2016   Procedure: BIOPSY;  Surgeon: [name], MD;  Location: [facility];  Service: Endoscopy;;  left descending colon biopsies  . COLONOSCOPY  11/20/2015   Dr. [name]: Severe erythema, edema, ulcers from the anal verge to 20 cm above the anal verge without mucosal sparing, remaining colon and terminal ileum appeared normal. Biopsies from the rectum revealed fulminant active chronic colitis consistent with IBD. Pathology from terminal ileum revealed intramucosal lymphoid aggregates. Remaining colon random biopsies with inactive chronic colitis  . COLONOSCOPY WITH PROPOFOL N/A 08/30/2013   TOI:ZTIWPY mucosa in the terminal ileum/COLITIS/ MILD PROCTITIS. 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  Marland Kitchen COLONOSCOPY WITH PROPOFOL N/A 11/20/2015   Dr. 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 ESOPHAGOGASTRODUODENOSCOPY  11/20/2015   Dr. [name]: Normal exam, stomach biopsied and duodenal biopsy.. Benign biopsies.  . ESOPHAGOGASTRODUODENOSCOPY (EGD) WITH PROPOFOL N/A 12/07/2012   SLF:The mucosa of the esophagus appeared normal Non-erosive gastritis (inflammation) was found in the gastric antrum; multiple biopsies The duodenal mucosa showed no  abnormalities in the bulb and second portion of the duodenum  . ESOPHAGOGASTRODUODENOSCOPY (EGD) WITH PROPOFOL N/A 11/20/2015   Dr. [name]: normal with normal biopsies, negative H.pylori   . ESOPHAGOGASTRODUODENOSCOPY (EGD) WITH PROPOFOL N/A 04/18/2016   Procedure: ESOPHAGOGASTRODUODENOSCOPY (EGD) WITH PROPOFOL;  Surgeon: [name], MD;  [facility];  Service: Endoscopy;  Laterality: N/A;  . FLEXIBLE SIGMOIDOSCOPY N/A 04/18/2016   Procedure: FLEXIBLE SIGMOIDOSCOPY;  Surgeon: [name], MD;  [facility];  Service: Endoscopy;  Laterality: N/A;  . TONSILLECTOMY      MEDICATIONS: Current Outpatient Medications on File Prior to Visit  Medication Sig Dispense Refill  . albuterol (PROVENTIL) (2.5 MG/3ML) 0.083% nebulizer solution Inhale 3 mLs (2.5 mg total) into the lungs every 6 (six) hours as needed for wheezing or shortness of breath. 60 vial 2  . albuterol (VENTOLIN HFA) 108 (90 Base) MCG/ACT inhaler Inhale into the lungs.    . Butalbital-APAP-Caffeine 50-300-40 MG CAPS Take by mouth.    . cholecalciferol (VITAMIN D) 1000 units tablet Take 1 tablet (1,000 Units total) by mouth daily. START AFTER VITAMIN D 50,000 U TABLETS ARE COMPLETE 30 tablet 11  . EPINEPHrine 0.3 mg/0.3 mL IJ SOAJ injection Inject 0.3 mg into the muscle once as needed (for severe allergic reaction). 1 each 1  . fluticasone (FLONASE) 50 MCG/ACT nasal spray Place 1 spray into both nostrils daily. 16 g 0  . loratadine (CLARITIN) 10  MG tablet Take 1 tablet (10 mg total) by mouth daily. 90 tablet 1  . montelukast (SINGULAIR) 10 MG tablet Take 1 tablet (10 mg total) by mouth at bedtime. 90 tablet 1  . pantoprazole (PROTONIX) 20 MG tablet Take 1 tablet (20 mg total) by mouth daily. 90 tablet 0  . propranolol (INDERAL) 10 MG tablet Take 1 tablet by mouth 3 (three) times daily.  2  . Respiratory Therapy Supplies (NEBULIZER COMPRESSOR) KIT And supplies 1 each 0  . traZODone (DESYREL) 150 MG tablet Take  1 tablet (150 mg total) by mouth at bedtime. 30 tablet 1   No current facility-administered medications on file prior to visit.    ALLERGIES: Allergies  Allergen Reactions  . Nsaids Other (See Comments)    Should not take due to GI condition  . Other Other (See Comments)    Should not take 2/2 GI condition Other reaction(s): Other (See Comments) Should not take due to GI condition  . Pineapple Swelling and Other (See Comments)    Reaction:  Lip swelling  . Remicade [Infliximab] Other (See Comments)    AUTOIMMUNE HEPATITIS  . Strawberry Extract Swelling    Reaction:  Lip swelling  . Amoxicillin-Pot Clavulanate Nausea And Vomiting and Other (See Comments)    GI intolerance   . Omeprazole Nausea And Vomiting  . Tomato Rash    FAMILY HISTORY: Family History  Problem Relation Age of Onset  . Asthma Mother   . Ulcers Mother   . Bipolar disorder Mother   . ADD / ADHD Father   . Diabetes Maternal Grandmother   . Hypertension Maternal Grandmother   . Sleep apnea Maternal Grandmother   . Heart disease Maternal Grandmother   . Diabetes Maternal Grandfather   . Hypertension Maternal Grandfather   . Sleep apnea Maternal Grandfather   . Heart disease Maternal Grandfather   . Cancer Maternal Grandfather        prostate  . Cancer Maternal Aunt        Leukemia  . Colon cancer Neg Hx   . Liver disease Neg Hx     Objective:  Blood pressure 114/86, pulse 69, resp. rate 20, height 5' 8.5" (1.74 m), weight 265 lb (120.2 kg), SpO2 99 %. General: No acute distress.  Patient appears well-groomed.   Head:  Normocephalic/atraumatic Eyes:  fundi examined but not visualized Neck: supple, no paraspinal tenderness, full range of motion Back: No paraspinal tenderness Heart: regular rate and rhythm Lungs: Clear to auscultation bilaterally. Vascular: No carotid bruits. Neurological Exam: Mental status: alert and oriented to person, place, and time, speech fluent and not dysarthric,  language intact. Cranial nerves: CN I: not tested CN II: pupils equal, round and reactive to light, visual fields intact CN III, IV, VI:  full range of motion, no nystagmus, no ptosis CN V: facial sensation intact. CN VII: upper and lower face symmetric CN VIII: hearing intact CN IX, X: gag intact, uvula midline CN XI: sternocleidomastoid and trapezius muscles intact CN XII: tongue midline Bulk & Tone: normal, no fasciculations. Motor:  muscle strength 5/5 throughout Sensation:  Pinprick, temperature and vibratory sensation intact. Deep Tendon Reflexes:  2+ throughout,  toes downgoing.   Finger to nose testing:  Without dysmetria.   Heel to shin:  Without dysmetria.   Gait:  Normal station and stride.  Romberg negative.    Thank you for allowing me to take part in the care of this patient.  Metta Clines, DO  CC: Jarrett Soho  Jerelene Redden, NP

## 2020-10-10 ENCOUNTER — Telehealth: Payer: Self-pay | Admitting: Licensed Clinical Social Worker

## 2020-10-10 DIAGNOSIS — F319 Bipolar disorder, unspecified: Secondary | ICD-10-CM

## 2020-10-10 NOTE — BH Specialist Note (Signed)
Virtual Behavioral Health Treatment Plan Team Note  MRN: [id number] NAME: Wayne Avila  DATE: 10/11/20  Start time:   5pEnd time:   510pTotal time:   36mn Total number of Virtual [place]Treatment Team Plan encounters: 1/4  Treatment Team Attendees: NRoyal Piedra LCSW; Dr. HModesta Messing Psychiatrist  Diagnoses:    ICD-10-CM   1. Bipolar 1 disorder (HCC)  F31.9     Goals, Interventions and Follow-up Plan Goals: Increase healthy adjustment to current life circumstances Increase motivation to adhere to plan of care Interventions: Behavioral Activation CBT Cognitive Behavioral Therapy Medication Management Recommendations: n/a Follow-up Plan: [facility]  History of the present illness Presenting Problem/Current Symptoms: symptoms of DX  Psychiatric History  Depression: Yes Anxiety: Yes Mania: Yes Psychosis: No PTSD symptoms: No  Past Psychiatric History/Hospitalization(s): Hospitalization for psychiatric illness: No Prior Suicide Attempts: No Prior Self-injurious behavior: No  Psychosocial stressors lack of medication  Self-harm Behaviors Risk Assessment   Screenings PHQ-9 Assessments:  [facility] 10/04/2020 07/19/2020 07/05/2020  Decreased Interest 0 0 0  Down, Depressed, Hopeless 0 0 0  PHQ - 2 Score 0 0 0  Some recent data might be hidden   GAD-7 Assessments:  GAD 7 : Generalized Anxiety Score 04/04/2020  Nervous, Anxious, on Edge 0  Control/stop worrying 0  Worry too much - different things 0  Trouble relaxing 0  Restless 0  Easily annoyed or irritable 0  Afraid - awful might happen 0  Total GAD 7 Score 0  Anxiety Difficulty Not difficult at all    Past Medical History Past Medical History:  Diagnosis Date  . ADHD (attention deficit hyperactivity disorder)   . Anxiety   . Asthma   . Autoimmune hepatitis ([place]   . Bipolar 1 disorder ([place]   . Bipolar 1 disorder, manic, moderate ([place] 11/09/2014  . C. difficile colitis  03/22/2016  . Clostridium difficile colitis   . Depression   . GERD (gastroesophageal reflux disease)   . HOH (hard of hearing)   . Mental developmental delay   . Seizures ([place]   . Ulcerative colitis ([place]     Vital signs: There were no vitals filed for this visit.  Allergies:  Allergies as of 10/10/2020 - Review Complete 10/04/2020  Allergen Reaction Noted  . Nsaids Other (See Comments) 01/09/2017  . Other Other (See Comments) 01/09/2017  . Pineapple Swelling and Other (See Comments) 04/14/2011  . Remicade [infliximab] Other (See Comments) 01/02/2017  . Strawberry extract Swelling 05/08/2012  . Amoxicillin-pot clavulanate Nausea And Vomiting and Other (See Comments) 02/28/2011  . Omeprazole Nausea And Vomiting 04/20/2013  . Tomato Rash 04/14/2011    Medication History Current medications:  Outpatient Encounter Medications as of 10/10/2020  Medication Sig  . albuterol (PROVENTIL) (2.5 MG/3ML) 0.083% nebulizer solution Inhale 3 mLs (2.5 mg total) into the lungs every 6 (six) hours as needed for wheezing or shortness of breath.  .Marland Kitchenalbuterol (VENTOLIN HFA) 108 (90 Base) MCG/ACT inhaler Inhale into the lungs.  . Butalbital-APAP-Caffeine 50-300-40 MG CAPS Take by mouth.  . cholecalciferol (VITAMIN D) 1000 units tablet Take 1 tablet (1,000 Units total) by mouth daily. START AFTER VITAMIN D 50,000 U TABLETS ARE COMPLETE  . EPINEPHrine 0.3 mg/0.3 mL IJ SOAJ injection Inject 0.3 mg into the muscle once as needed (for severe allergic reaction). (Patient not taking: Reported on 10/11/2020)  . fluticasone (FLONASE) 50 MCG/ACT nasal spray Place 1 spray into both nostrils daily.  .Marland Kitchenloratadine (CLARITIN) 10 MG  tablet Take 1 tablet (10 mg total) by mouth daily.  . montelukast (SINGULAIR) 10 MG tablet Take 1 tablet (10 mg total) by mouth at bedtime.  . pantoprazole (PROTONIX) 20 MG tablet Take 1 tablet (20 mg total) by mouth daily.  . propranolol (INDERAL) 10 MG tablet Take 1 tablet by mouth 3  (three) times daily.  Marland Kitchen Respiratory Therapy Supplies (NEBULIZER COMPRESSOR) KIT And supplies  . traZODone (DESYREL) 150 MG tablet Take 1 tablet (150 mg total) by mouth at bedtime.   No facility-administered encounter medications on file as of 10/10/2020.     Scribe for Treatment Team: Lubertha South, LCSW

## 2020-10-11 ENCOUNTER — Other Ambulatory Visit: Payer: Self-pay

## 2020-10-11 ENCOUNTER — Encounter: Payer: Self-pay | Admitting: Neurology

## 2020-10-11 ENCOUNTER — Ambulatory Visit: Payer: Medicaid Other | Admitting: Neurology

## 2020-10-11 VITALS — BP 114/86 | HR 69 | Resp 20 | Ht 68.5 in | Wt 265.0 lb

## 2020-10-11 DIAGNOSIS — G43009 Migraine without aura, not intractable, without status migrainosus: Secondary | ICD-10-CM

## 2020-10-11 DIAGNOSIS — F819 Developmental disorder of scholastic skills, unspecified: Secondary | ICD-10-CM | POA: Diagnosis not present

## 2020-10-11 DIAGNOSIS — F319 Bipolar disorder, unspecified: Secondary | ICD-10-CM

## 2020-10-11 DIAGNOSIS — Z87898 Personal history of other specified conditions: Secondary | ICD-10-CM

## 2020-10-11 MED ORDER — PROMETHAZINE HCL 25 MG PO TABS: 25.0000 mg | ORAL_TABLET | Freq: Four times a day (QID) | ORAL | 5 refills | Status: DC | PRN

## 2020-10-11 MED ORDER — SUMATRIPTAN SUCCINATE 100 MG PO TABS: ORAL_TABLET | ORAL | 5 refills | Status: DC

## 2020-10-11 NOTE — Patient Instructions (Signed)
  1. Stop the butalbital  2. Take sumatriptan 130m at earliest onset of headache.  May repeat dose once in 2 hours if needed.  Maximum 2 tablets in 24 hours. 3. Take promethazine for nausea 4. Limit use of pain relievers to no more than 2 days out of the week.  These medications include acetaminophen, NSAIDs (ibuprofen/Advil/Motrin, naproxen/Aleve, triptans (Imitrex/sumatriptan), Excedrin, and narcotics.  This will help reduce risk of rebound headaches. 5. Be aware of common food triggers:  - Caffeine:  coffee, black tea, cola, Mt. Dew  - Chocolate  - Dairy:  aged cheeses (brie, blue, cheddar, gouda, PTarkio provolone, r[place] Swiss, etc), chocolate milk, buttermilk, sour cream, limit eggs and yogurt  - Nuts, peanut butter  - Alcohol  - Cereals/grains:  FRESH breads (fresh bagels, sourdough, doughnuts), yeast productions  - Processed/canned/aged/cured meats (pre-packaged deli meats, hotdogs)  - MSG/glutamate:  soy sauce, flavor enhancer, pickled/preserved/marinated foods  - Sweeteners:  aspartame (Equal, Nutrasweet).  Sugar and Splenda are okay  - Vegetables:  legumes (lima beans, lentils, snow peas, fava beans, pinto peans, peas, garbanzo beans), sauerkraut, onions, olives, pickles  - Fruit:  avocados, bananas, citrus fruit (orange, lemon, grapefruit), mango  - Other:  Frozen meals, macaroni and cheese 6. Routine exercise 7. Stay adequately hydrated (aim for 64 oz water daily) 8. Keep headache diary 9. Maintain proper stress management 10. Maintain proper sleep hygiene 11. Do not skip meals 12. Consider supplements:  magnesium citrate 4022mdaily, riboflavin 40045maily, coenzyme Q10 100m57mree times daily.

## 2020-11-01 ENCOUNTER — Encounter: Payer: Self-pay | Admitting: Gastroenterology

## 2020-11-11 ENCOUNTER — Ambulatory Visit
Admit: 2020-11-11 | Discharge: 2020-11-12 | Disposition: A | Discharge status: Home / Self Care | Payer: MEDICAID | Attending: Family

## 2020-11-11 ENCOUNTER — Emergency Department
Admit: 2020-11-11 | Discharge: 2020-11-12 | Disposition: A | Discharge status: Home / Self Care | Payer: MEDICAID | Attending: Family

## 2020-11-11 DIAGNOSIS — R1033 Periumbilical pain: Principal | ICD-10-CM

## 2020-11-11 DIAGNOSIS — R112 Nausea with vomiting, unspecified: Principal | ICD-10-CM

## 2020-11-11 MED ORDER — FAMOTIDINE 20 MG TABLET
ORAL_TABLET | Freq: Every day | ORAL | 0 refills | 10.00000 days | Status: CP
Start: 2020-11-11 — End: 2020-11-21

## 2020-11-11 MED ORDER — ONDANSETRON 4 MG DISINTEGRATING TABLET
ORAL_TABLET | Freq: Three times a day (TID) | ORAL | 0 refills | 7 days | Status: CP | PRN
Start: 2020-11-11 — End: 2020-11-18

## 2020-11-16 ENCOUNTER — Ambulatory Visit
Admit: 2020-11-16 | Discharge: 2020-11-17 | Disposition: A | Discharge status: Other Acute Inpatient Hospital | Payer: MEDICAID

## 2020-11-16 ENCOUNTER — Emergency Department
Admit: 2020-11-16 | Discharge: 2020-11-17 | Disposition: A | Discharge status: Other Acute Inpatient Hospital | Payer: MEDICAID

## 2020-11-20 ENCOUNTER — Telehealth: Payer: Self-pay

## 2020-11-20 NOTE — Telephone Encounter (Signed)
Transition Care Management Follow-up Telephone Call Date of discharge and from where: [facility] in [place]  How have you been since you were released from the hospital? Doing some better but some pain in gallbladder area Any questions or concerns? No  Items Reviewed: Did the pt receive and understand the discharge instructions provided? yes Medications obtained and verified? yes Other? No  Any new allergies since your discharge? No  Dietary orders reviewed? yes Do you have support at home? Yes , lives with mother   [place] and Equipment/Supplies: Were home health services ordered? no If so, what is the name of the agency? N/A  Has the agency set up a time to come to the patient's home? no Were any new equipment or medical supplies ordered?  No What is the name of the medical supply agency? N/A Were you able to get the supplies/equipment? not applicable Do you have any questions related to the use of the equipment or supplies? No  Functional Questionnaire: (I = Independent and D = Dependent) ADLs: I  Bathing/Dressing- I  Meal Prep- I  Eating- I  Maintaining continence- I  Transferring/Ambulation- I  Managing Meds- I  Follow up appointments reviewed:  PCP Hospital f/u appt confirmed? Yes  Scheduled to see Pearline Cables on June 24 @ 8:20am. [facility] f/u appt confirmed? Yes  Scheduled to see [facility] on July 6 @ 2:10pm. Are transportation arrangements needed? No  If their condition worsens, is the pt aware to call PCP or go to the Emergency Dept.? Yes Was the patient provided with contact information for the PCP's office or ED? Yes Was to pt encouraged to call back with questions or concerns? Yes

## 2020-11-30 ENCOUNTER — Other Ambulatory Visit: Payer: Self-pay

## 2020-11-30 ENCOUNTER — Ambulatory Visit (INDEPENDENT_AMBULATORY_CARE_PROVIDER_SITE_OTHER): Payer: Medicaid Other | Admitting: Nurse Practitioner

## 2020-11-30 ENCOUNTER — Encounter: Payer: Self-pay | Admitting: Nurse Practitioner

## 2020-11-30 VITALS — BP 110/78 | HR 72 | Temp 97.0°F | Ht 68.5 in | Wt 255.0 lb

## 2020-11-30 DIAGNOSIS — K829 Disease of gallbladder, unspecified: Secondary | ICD-10-CM

## 2020-11-30 NOTE — Assessment & Plan Note (Addendum)
-  asymptomatic today -request records -will get labs today -Referral to surgery -has upcoming GI appt -not sure why he was flow to [facility] for surgery, but this was not completed; requesting records

## 2020-11-30 NOTE — Progress Notes (Signed)
Acute Office Visit  Subjective:    Patient ID: Wayne Avila, male    DOB: 08/24/1990, 30 y.o.   MRN: [id number]  Chief Complaint  Patient presents with   Transitions Of Care    Abdominal pain, gallstone.    HPI Patient is in today for hospital follow-up.  He went to [facility] ED on 11-15-20 and had gallstones and sludge in gallbladder, and was felt to have acute cholecystitis.  ED notes from that encounter state,  "Received patient pending repeat laboratory studies, right upper quadrant ultrasound. Repeat laboratory studies reveal slight improvement in LFTs, T bili. Patient remains tender in the right upper quadrant, positive Murphy sign. Right upper quadrant ultrasound reveals acute cholecystitis with thickened gallbladder wall, pericholecystitc fluid. Upper quadrant ultrasound called as acute cholecystitis. Appears to be gallbladder sludge, no clear stone. Discussed this case with general surgery at [facility], however general surgery feels that this is a complicated case due to patient's underlying ulcerative colitis status post colectomy, previous history of autoimmune hepatitis and multiple abdominal surgeries. Discussed this case with [facility], [place], [place] and all are at capacity. Patient is placed on the wait list at [facility] and [place]. Discussed this case with [facility] in [facility] who have agreed to ED to ED transfer. Transfer process initiated. Patient signed out to Dr. Hinton Lovely at shift change..   Diagnosis:  1. Generalized abdominal pain  2. Non-intractable vomiting with nausea, unspecified vomiting type  3. Volume depletion  4. Elevated liver function tests"  He was transferred to [facility] in [place] for emergency surgery, but he states that he did not have surgery.  Today he is experiencing no symptoms, but he states he was told that he still needs to have his gallbladder removed. No records  available from [facility] today.   Past Medical History:  Diagnosis Date   ADHD (attention deficit hyperactivity disorder)    Anxiety    Asthma    Autoimmune hepatitis ([place])    Bipolar 1 disorder (HCC)    Bipolar 1 disorder, manic, moderate ([place]) 11/09/2014   C. difficile colitis 03/22/2016   Clostridium difficile colitis    Depression    GERD (gastroesophageal reflux disease)    HOH (hard of hearing)    Mental developmental delay    Seizures ([place])    Ulcerative colitis ([place])     Past Surgical History:  Procedure Laterality Date   ADENOIDECTOMY     BIOPSY N/A 12/07/2012   Procedure: GASTRIC BIOPSIES;  Surgeon: [name], MD;  Location: [facility]S;  Service: Endoscopy;  Laterality: N/A;   BIOPSY N/A 08/30/2013   Procedure: BIOPSY;  Surgeon: [name], MD;  Location: [facility];  Service: Endoscopy;  Laterality: N/A;  right colon,transverse colon, left colon, rectal biopsies   BIOPSY  11/20/2015   Procedure: BIOPSY;  Surgeon: [name], MD;  Location: [facility];  Service: Endoscopy;;  ileal, right colon biopsy, left colon, rectum   BIOPSY  04/18/2016   Procedure: BIOPSY;  Surgeon: [name], MD;  Location: [facility];  Service: Endoscopy;;  left descending colon biopsies   COLONOSCOPY  11/20/2015   Dr. [name]: Severe erythema, edema, ulcers from the anal verge to 20 cm above the anal verge without mucosal sparing, remaining colon and terminal ileum appeared normal. Biopsies from the rectum revealed fulminant active chronic colitis consistent with IBD. Pathology from terminal ileum revealed intramucosal lymphoid aggregates. Remaining  colon random biopsies with inactive chronic colitis   COLONOSCOPY WITH PROPOFOL N/A 08/30/2013   POE:UMPNTI mucosa in the terminal ileum/COLITIS/ MILD PROCTITIS. 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   COLONOSCOPY WITH PROPOFOL N/A  11/20/2015   Dr. [name]: chronic inactive pancolitis and active severe ulcerative proctitis    ESOPHAGOGASTRODUODENOSCOPY  11/20/2015   Dr. [name]: Normal exam, stomach biopsied and duodenal biopsy.. Benign biopsies.   ESOPHAGOGASTRODUODENOSCOPY (EGD) WITH PROPOFOL N/A 12/07/2012   SLF:The mucosa of the esophagus appeared normal Non-erosive gastritis (inflammation) was found in the gastric antrum; multiple biopsies The duodenal mucosa showed no abnormalities in the bulb and second portion of the duodenum   ESOPHAGOGASTRODUODENOSCOPY (EGD) WITH PROPOFOL N/A 11/20/2015   Dr. [name]: normal with normal biopsies, negative H.pylori    ESOPHAGOGASTRODUODENOSCOPY (EGD) WITH PROPOFOL N/A 04/18/2016   Procedure: ESOPHAGOGASTRODUODENOSCOPY (EGD) WITH PROPOFOL;  Surgeon: [name], MD;  [facility];  Service: Endoscopy;  Laterality: N/A;   FLEXIBLE SIGMOIDOSCOPY N/A 04/18/2016   Procedure: FLEXIBLE SIGMOIDOSCOPY;  Surgeon: [name], MD;  Location[facility];  Service: Endoscopy;  Laterality: N/A;   TONSILLECTOMY      Family History  Problem Relation Age of Onset   Asthma Mother    Ulcers Mother    Bipolar disorder Mother    ADD / ADHD Father    Diabetes Maternal Grandmother    Hypertension Maternal Grandmother    Sleep apnea Maternal Grandmother    Heart disease Maternal Grandmother    Diabetes Maternal Grandfather    Hypertension Maternal Grandfather    Sleep apnea Maternal Grandfather    Heart disease Maternal Grandfather    Cancer Maternal Grandfather        prostate   Cancer Maternal Aunt        Leukemia   Colon cancer Neg Hx    Liver disease Neg Hx     Social History   Socioeconomic History   Marital status: Single    Spouse name: Not on file   Number of children: 1   Years of education: 12th   Highest education level: Not on file  Occupational History    Employer: NOT EMPLOYED    Comment: disabled  Tobacco Use   Smoking status: Former    Packs/day:  0.00    Years: 2.00    Pack years: 0.00    Types: Cigarettes    Quit date: 10/06/2012    Years since quitting: 8.1   Smokeless tobacco: Never   Tobacco comments:    Never smoked cigarettes  Vaping Use   Vaping Use: Never used  Substance and Sexual Activity   Alcohol use: No    Alcohol/week: 0.0 standard drinks    Comment: denies usage   Drug use: No   Sexual activity: Yes    Birth control/protection: None  Other Topics Concern   Not on file  Social History Narrative   Lives with mother   Cory Roughen and Aunt in the home.   Caffeine Use: occasionally   Diet: eats all food groups   Water: 6-8 cups daily       Likes to game late and sleep in until noon   [facility]      Does not drive   Wears seat belt    Smoke detectors    No weapons   Right handed   Social Determinants of Health  Financial Resource Strain: Low Risk    Difficulty of Paying Living Expenses: Not hard at all  Food Insecurity: No Food Insecurity   Worried About Charity fundraiser in the Last Year: Never true   Ran Out of Food in the Last Year: Never true  Transportation Needs: No Transportation Needs   Lack of Transportation (Medical): No   Lack of Transportation (Non-Medical): No  Physical Activity: Inactive   Days of Exercise per Week: 0 days   Minutes of Exercise per Session: 0 min  Stress: Stress Concern Present   Feeling of Stress : To some extent  Social Connections: Moderately Isolated   Frequency of Communication with Friends and Family: More than three times a week   Frequency of Social Gatherings with Friends and Family: Twice a week   Attends Religious Services: 1 to 4 times per year   Active Member of [employer] or Organizations: No   Attends Archivist Meetings: Never   Marital Status: Separated  Intimate Partner Violence: Not At Risk   Fear of Current or Ex-Partner: No   Emotionally Abused: No   Physically Abused: No   Sexually Abused: No     Outpatient Medications Prior to Visit  Medication Sig Dispense Refill   albuterol (PROVENTIL) (2.5 MG/3ML) 0.083% nebulizer solution Inhale 3 mLs (2.5 mg total) into the lungs every 6 (six) hours as needed for wheezing or shortness of breath. 60 vial 2   albuterol (VENTOLIN HFA) 108 (90 Base) MCG/ACT inhaler Inhale into the lungs.     cholecalciferol (VITAMIN D) 1000 units tablet Take 1 tablet (1,000 Units total) by mouth daily. START AFTER VITAMIN D 50,000 U TABLETS ARE COMPLETE 30 tablet 11   EPINEPHrine 0.3 mg/0.3 mL IJ SOAJ injection Inject 0.3 mg into the muscle once as needed (for severe allergic reaction). 1 each 1   fluticasone (FLONASE) 50 MCG/ACT nasal spray Place 1 spray into both nostrils daily. 16 g 0   loratadine (CLARITIN) 10 MG tablet Take 1 tablet (10 mg total) by mouth daily. 90 tablet 1   montelukast (SINGULAIR) 10 MG tablet Take 1 tablet (10 mg total) by mouth at bedtime. 90 tablet 1   pantoprazole (PROTONIX) 20 MG tablet Take 1 tablet (20 mg total) by mouth daily. 90 tablet 0   promethazine (PHENERGAN) 25 MG tablet Take 1 tablet (25 mg total) by mouth every 6 (six) hours as needed for nausea or vomiting. 30 tablet 5   propranolol (INDERAL) 10 MG tablet Take 1 tablet by mouth 3 (three) times daily.  2   Respiratory Therapy Supplies (NEBULIZER COMPRESSOR) KIT And supplies 1 each 0   SUMAtriptan (IMITREX) 100 MG tablet Take 1 tablet earliest onset of headache.  May repeat in 2 hours if headache persists or recurs.  Maximum 2 tablets in 24 hours. 10 tablet 5   traZODone (DESYREL) 150 MG tablet Take 1 tablet (150 mg total) by mouth at bedtime. 30 tablet 1   Butalbital-APAP-Caffeine 50-300-40 MG CAPS Take by mouth. (Patient not taking: Reported on 11/30/2020)     No facility-administered medications prior to visit.    Allergies  Allergen Reactions   Nsaids Other (See Comments)    Should not take due to GI condition   Other Other (See Comments)    Should not take 2/2 GI  condition Other reaction(s): Other (See Comments) Should not take due to GI condition   Pineapple Swelling and Other (See Comments)    Reaction:  Lip swelling  Remicade [Infliximab] Other (See Comments)    AUTOIMMUNE HEPATITIS   Strawberry Extract Swelling    Reaction:  Lip swelling   Amoxicillin-Pot Clavulanate Nausea And Vomiting and Other (See Comments)    GI intolerance    Omeprazole Nausea And Vomiting   Tomato Rash    Review of Systems  Constitutional: Negative.   Respiratory: Negative.    Cardiovascular: Negative.   Gastrointestinal: Negative.   Psychiatric/Behavioral: Negative.        Objective:    Physical Exam Constitutional:      Appearance: Normal appearance. He is obese.  Cardiovascular:     Rate and Rhythm: Normal rate and regular rhythm.     Pulses: Normal pulses.     Heart sounds: Normal heart sounds.  Pulmonary:     Effort: Pulmonary effort is normal.     Breath sounds: Normal breath sounds.  Abdominal:     General: Abdomen is flat. Bowel sounds are normal. There is no distension.     Palpations: Abdomen is soft. There is no mass.     Tenderness: There is no abdominal tenderness. There is no guarding or rebound.     Hernia: No hernia is present.     Comments: Surgical scars present  Neurological:     Mental Status: He is alert.    BP 110/78 (BP Location: Right Arm, Patient Position: Sitting, Cuff Size: Large)   Pulse 72   Temp (!) 97 F (36.1 C) (Temporal)   Ht 5' 8.5" (1.74 m)   Wt 255 lb (115.7 kg)   SpO2 96%   BMI 38.21 kg/m  Wt Readings from Last 3 Encounters:  11/30/20 255 lb (115.7 kg)  10/11/20 265 lb (120.2 kg)  10/04/20 271 lb (122.9 kg)    Health Maintenance Due  Topic Date Due   COVID-19 Vaccine (1) Never done    There are no preventive care reminders to display for this patient.   Lab Results  Component Value Date   TSH 2.909 03/24/2016   Lab Results  Component Value Date   WBC 7.1 04/04/2020   HGB 13.7  04/04/2020   HCT 40.6 04/04/2020   MCV 84 04/04/2020   PLT 238 04/04/2020   Lab Results  Component Value Date   NA 141 04/04/2020   K 4.3 04/04/2020   CO2 19 (L) 04/04/2020   GLUCOSE 94 04/04/2020   BUN 7 04/04/2020   CREATININE 0.97 04/04/2020   BILITOT 0.3 04/04/2020   ALKPHOS 95 04/04/2020   AST 26 04/04/2020   ALT 23 04/04/2020   PROT 6.9 04/04/2020   ALBUMIN 4.3 04/04/2020   CALCIUM 9.5 04/04/2020   ANIONGAP 7 03/08/2018   Lab Results  Component Value Date   CHOL 153 05/05/2017   Lab Results  Component Value Date   HDL 37 (L) 05/05/2017   Lab Results  Component Value Date   LDLCALC 90 05/05/2017   Lab Results  Component Value Date   TRIG 164 (H) 05/05/2017   Lab Results  Component Value Date   CHOLHDL 4.1 05/05/2017   Lab Results  Component Value Date   HGBA1C 5.8 (H) 04/04/2020       Assessment & Plan:   Problem List Items Addressed This Visit       Digestive   Gallbladder disease - Primary    -asymptomatic today -request records -will get labs today -Referral to surgery -has upcoming GI appt -not sure why he was flow to [facility] for surgery, but this was not  completed; requesting records       Relevant Orders   CBC with Differential/Platelet   CMP14+EGFR   Lipase     No orders of the defined types were placed in this encounter.    Noreene Larsson, NP

## 2020-12-01 LAB — CMP14+EGFR
ALT: 23 IU/L (ref 0–44)
AST: 22 IU/L (ref 0–40)
Albumin/Globulin Ratio: 1.8 (ref 1.2–2.2)
Albumin: 4.2 g/dL (ref 4.1–5.2)
Alkaline Phosphatase: 105 IU/L (ref 44–121)
BUN/Creatinine Ratio: 4 — ABNORMAL LOW (ref 9–20)
BUN: 3 mg/dL — ABNORMAL LOW (ref 6–20)
Bilirubin Total: 0.2 mg/dL (ref 0.0–1.2)
CO2: 18 mmol/L — ABNORMAL LOW (ref 20–29)
Calcium: 9.4 mg/dL (ref 8.7–10.2)
Chloride: 108 mmol/L — ABNORMAL HIGH (ref 96–106)
Creatinine, Ser: 0.84 mg/dL (ref 0.76–1.27)
Globulin, Total: 2.4 g/dL (ref 1.5–4.5)
Glucose: 96 mg/dL (ref 65–99)
Potassium: 4.2 mmol/L (ref 3.5–5.2)
Sodium: 145 mmol/L — ABNORMAL HIGH (ref 134–144)
Total Protein: 6.6 g/dL (ref 6.0–8.5)
eGFR: 122 mL/min/{1.73_m2} (ref >59)

## 2020-12-01 LAB — CBC WITH DIFFERENTIAL/PLATELET
Basophils Absolute: 0.1 10*3/uL (ref 0.0–0.2)
Basos: 1 %
EOS (ABSOLUTE): 0.2 10*3/uL (ref 0.0–0.4)
Eos: 2 %
Hematocrit: 41.3 % (ref 37.5–51.0)
Hemoglobin: 13.9 g/dL (ref 13.0–17.7)
Immature Grans (Abs): 0 10*3/uL (ref 0.0–0.1)
Immature Granulocytes: 0 %
Lymphocytes Absolute: 2.8 10*3/uL (ref 0.7–3.1)
Lymphs: 34 %
MCH: 27.9 pg (ref 26.6–33.0)
MCHC: 33.7 g/dL (ref 31.5–35.7)
MCV: 83 fL (ref 79–97)
Monocytes Absolute: 0.6 10*3/uL (ref 0.1–0.9)
Monocytes: 7 %
Neutrophils Absolute: 4.6 10*3/uL (ref 1.4–7.0)
Neutrophils: 56 %
Platelets: 221 10*3/uL (ref 150–450)
RBC: 4.99 x10E6/uL (ref 4.14–5.80)
RDW: 14 % (ref 11.6–15.4)
WBC: 8.2 10*3/uL (ref 3.4–10.8)

## 2020-12-01 LAB — LIPASE: Lipase: 23 U/L (ref 13–78)

## 2020-12-02 NOTE — Progress Notes (Signed)
His labs look ok. LFTs and lipase are ok now.

## 2020-12-12 ENCOUNTER — Encounter: Payer: Self-pay | Admitting: Gastroenterology

## 2020-12-12 ENCOUNTER — Ambulatory Visit (INDEPENDENT_AMBULATORY_CARE_PROVIDER_SITE_OTHER): Payer: Medicaid Other | Admitting: Gastroenterology

## 2020-12-12 VITALS — BP 124/64 | HR 75 | Ht 68.0 in | Wt 253.0 lb

## 2020-12-12 DIAGNOSIS — Z9049 Acquired absence of other specified parts of digestive tract: Secondary | ICD-10-CM | POA: Diagnosis not present

## 2020-12-12 DIAGNOSIS — K8019 Calculus of gallbladder with other cholecystitis with obstruction: Secondary | ICD-10-CM | POA: Diagnosis not present

## 2020-12-12 DIAGNOSIS — K51018 Ulcerative (chronic) pancolitis with other complication: Secondary | ICD-10-CM

## 2020-12-12 NOTE — Patient Instructions (Signed)
If you are age 28 or older, your body mass index should be between 23-30. Your Body mass index is 38.47 kg/m. If this is out of the aforementioned range listed, please consider follow up with your Primary Care Provider.  If you are age 4 or younger, your body mass index should be between 19-25. Your Body mass index is 38.47 kg/m. If this is out of the aformentioned range listed, please consider follow up with your Primary Care Provider.   We will send a referral to [facility] and they will call you with an appointment.  The [place] [facility] providers would like to encourage you to use [facility] to communicate with providers for non-urgent requests or questions.  Due to long hold times on the telephone, sending your provider a message by [facility] may be a faster and more efficient way to get a response.  Please allow 48 business hours for a response.  Please remember that this is for non-urgent requests.   It was a pleasure to see you today!  Thank you for trusting me with your gastrointestinal care!     Scott e. Candis Schatz , MD

## 2020-12-12 NOTE — Progress Notes (Signed)
HPI:  Mr. Kuhrt is a pleasant 30 y/o male with a history of bipolar disorder, seizures, and ulcerative s/p total abdominal colectomy in May 2020 at [facility], followed by completion proctectomy  with J-pouch creation in Sept 2703 which was complicated by a contained bowel perforation managed with percutaneous drains, antibiotics and TPN.  He underwent colectomy due to severe refractory colitis unresponsive to medical therapy.  He eventually recovered from his surgery and here today to establish new GI care closer to where he lives [place], [place]).   A month ago, the patient presented to the ER in [place] with severe abdominal pain nausea and vomiting.  Initially he had a CT scan and labs that were unremarkable and he was sent home.  However, few days later he represented with the same complaints.  At this time, he had elevated liver enzymes and a mixed pattern and had an ultrasound demonstrating stones and sludge and gallbladder wall thickening.  He was diagnosed with cholecystitis, however, given his complicated surgical history it was difficult to find a surgeon who was willing to accept him.  He ended up spending 2 days in the emergency room until finally there is a bed available in [place].  He was transported via helicopter to [place], however, upon arrival he was clinically improving and his liver enzymes were downtrending.  The surgeons in [place] elected to defer his surgery given his clinical stability. Since then, the patient has not had any recurrence of his symptoms attributed to gallstones.  However, he has not been able to establish care with a surgeon locally to discuss elective cholecystectomy. Regarding his ulcerative colitis, initially following his colectomy the patient suffered from significant diarrhea and urgency requiring a robust antimotility regimen.  However, his bowel habits have improved significantly since then.  Currently, the patient is experiencing 2-3 bowel movements per day with  partially formed stools without urgency.  He denies blood in his stools, nocturnal bowel movements, abdominal pain or tenesmus.  His appetite is good and his weight is stable. He is accompanied by his mother today who provides the bulk of the medical history.     Past Medical History:  Diagnosis Date   ADHD (attention deficit hyperactivity disorder)    Anxiety    Asthma    Autoimmune hepatitis ([place])    Bipolar 1 disorder (HCC)    Bipolar 1 disorder, manic, moderate ([place]) 11/09/2014   C. difficile colitis 03/22/2016   Clostridium difficile colitis    Depression    GERD (gastroesophageal reflux disease)    HOH (hard of hearing)    Mental developmental delay    Seizures ([place])    Ulcerative colitis ([place])     Past Surgical History:  Procedure Laterality Date   ADENOIDECTOMY     BIOPSY N/A 12/07/2012   Procedure: GASTRIC BIOPSIES;  Surgeon: [name], MD;  Location: [facility];  Service: Endoscopy;  Laterality: N/A;   BIOPSY N/A 08/30/2013   Procedure: BIOPSY;  Surgeon: [name], MD;  Location: [facility];  Service: Endoscopy;  Laterality: N/A;  right colon,transverse colon, left colon, rectal biopsies   BIOPSY  11/20/2015   Procedure: BIOPSY;  Surgeon: [name], MD;  Location: [facility] SU[facility];  Service: Endoscopy;;  ileal, right colon biopsy, left colon, rectum   BIOPSY  04/18/2016   Procedure: BIOPSY;  Surgeon: [name], MD;  Location: [facility];  Service: Endoscopy;;  left descending colon biopsies   COLONOSCOPY  11/20/2015   Dr. [name]:  Severe erythema, edema, ulcers from the anal verge to 20 cm above the anal verge without mucosal sparing, remaining colon and terminal ileum appeared normal. Biopsies from the rectum revealed fulminant active chronic colitis consistent with IBD. Pathology from terminal ileum revealed intramucosal lymphoid aggregates. Remaining colon random biopsies with inactive chronic colitis   COLONOSCOPY WITH PROPOFOL N/A 08/30/2013   BLT:JQZESP mucosa  in the terminal ileum/COLITIS/ MILD PROCTITIS. 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   COLONOSCOPY WITH PROPOFOL N/A 11/20/2015   Dr. [name]: chronic inactive pancolitis and active severe ulcerative proctitis    ESOPHAGOGASTRODUODENOSCOPY  11/20/2015   Dr. [name]: Normal exam, stomach biopsied and duodenal biopsy.. Benign biopsies.   ESOPHAGOGASTRODUODENOSCOPY (EGD) WITH PROPOFOL N/A 12/07/2012   SLF:The mucosa of the esophagus appeared normal Non-erosive gastritis (inflammation) was found in the gastric antrum; multiple biopsies The duodenal mucosa showed no abnormalities in the bulb and second portion of the duodenum   ESOPHAGOGASTRODUODENOSCOPY (EGD) WITH PROPOFOL N/A 11/20/2015   Dr. [name]: normal with normal biopsies, negative H.pylori    ESOPHAGOGASTRODUODENOSCOPY (EGD) WITH PROPOFOL N/A 04/18/2016   Procedure: ESOPHAGOGASTRODUODENOSCOPY (EGD) WITH PROPOFOL;  Surgeon: [name], MD;  [facility];  Service: Endoscopy;  Laterality: N/A;   FLEXIBLE SIGMOIDOSCOPY N/A 04/18/2016   Procedure: FLEXIBLE SIGMOIDOSCOPY;  Surgeon: [name], MD;  [facility];  Service: Endoscopy;  Laterality: N/A;   TONSILLECTOMY      Outpatient Medications Prior to Visit  Medication Sig Dispense Refill   albuterol (PROVENTIL) (2.5 MG/3ML) 0.083% nebulizer solution Inhale 3 mLs (2.5 mg total) into the lungs every 6 (six) hours as needed for wheezing or shortness of breath. 60 vial 2   albuterol (VENTOLIN HFA) 108 (90 Base) MCG/ACT inhaler Inhale into the lungs.     cholecalciferol (VITAMIN D) 1000 units tablet Take 1 tablet (1,000 Units total) by mouth daily. START AFTER VITAMIN D 50,000 U TABLETS ARE COMPLETE 30 tablet 11   EPINEPHrine 0.3 mg/0.3 mL IJ SOAJ injection Inject 0.3 mg into the muscle once as needed (for severe allergic reaction). 1 each 1   fluticasone  (FLONASE) 50 MCG/ACT nasal spray Place 1 spray into both nostrils daily. 16 g 0   loratadine (CLARITIN) 10 MG tablet Take 1 tablet (10 mg total) by mouth daily. 90 tablet 1   montelukast (SINGULAIR) 10 MG tablet Take 1 tablet (10 mg total) by mouth at bedtime. 90 tablet 1   pantoprazole (PROTONIX) 20 MG tablet Take 1 tablet (20 mg total) by mouth daily. 90 tablet 0   promethazine (PHENERGAN) 25 MG tablet Take 1 tablet (25 mg total) by mouth every 6 (six) hours as needed for nausea or vomiting. 30 tablet 5   propranolol (INDERAL) 10 MG tablet Take 1 tablet by mouth 3 (three) times daily.  2   Respiratory Therapy Supplies (NEBULIZER COMPRESSOR) KIT And supplies 1 each 0   SUMAtriptan (IMITREX) 100 MG tablet Take 1 tablet earliest onset of headache.  May repeat in 2 hours if headache persists or recurs.  Maximum 2 tablets in 24 hours. 10 tablet 5   traZODone (DESYREL) 150 MG tablet Take 1 tablet (150 mg total) by mouth at bedtime. 30 tablet 1   No facility-administered medications prior to visit.    Allergies  Allergen Reactions   Nsaids Other (See Comments)    Should not take due to GI condition   Other Other (See  Comments)    Should not take 2/2 GI condition Other reaction(s): Other (See Comments) Should not take due to GI condition   Pineapple Swelling and Other (See Comments)    Reaction:  Lip swelling   Remicade [Infliximab] Other (See Comments)    AUTOIMMUNE HEPATITIS   Strawberry Extract Swelling    Reaction:  Lip swelling   Amoxicillin-Pot Clavulanate Nausea And Vomiting and Other (See Comments)    GI intolerance    Omeprazole Nausea And Vomiting   Tomato Rash    Family History  Problem Relation Age of Onset   Asthma Mother    Ulcers Mother    Bipolar disorder Mother    ADD / ADHD Father    Ulcerative colitis Maternal Aunt    Cancer Maternal Aunt        Leukemia   Ulcerative colitis Paternal Uncle    Colon polyps Maternal Grandmother    Diabetes Maternal Grandmother     Hypertension Maternal Grandmother    Sleep apnea Maternal Grandmother    Heart disease Maternal Grandmother    Diabetes Maternal Grandfather    Hypertension Maternal Grandfather    Sleep apnea Maternal Grandfather    Heart disease Maternal Grandfather    Cancer Maternal Grandfather        prostate   Colon cancer Neg Hx    Liver disease Neg Hx     Social History   Tobacco Use   Smoking status: Former    Packs/day: 0.00    Years: 2.00    Pack years: 0.00    Types: Cigarettes    Quit date: 10/06/2012    Years since quitting: 8.1   Smokeless tobacco: Never   Tobacco comments:    Never smoked cigarettes  Vaping Use   Vaping Use: Never used  Substance Use Topics   Alcohol use: No    Alcohol/week: 0.0 standard drinks    Comment: denies usage   Drug use: No    ROS: As per history of present illness, otherwise negative  BP 124/64   Pulse 75   Ht 5' 8"  (1.727 m)   Wt 253 lb (114.8 kg)   SpO2 98%   BMI 38.47 kg/m  Constitutional: Well-developed and well-nourished. No distress. HEENT: Normocephalic and atraumatic. Oropharynx is clear and moist.  No scleral icterus. Cardiovascular: Normal rate, regular rhythm and intact distal pulses. No M/R/G Pulmonary/chest: Effort normal and breath sounds normal. No wheezing, rales or rhonchi. Abdominal: Soft, nontender, nondistended. Bowel sounds active throughout. There are no masses palpable. No hepatosplenomegaly.  Large, well healed vertical midline surgical scar Extremities: no clubbing, cyanosis, or edema Neurological: Alert and oriented to person place and time. Skin: Skin is warm and dry.  Psychiatric: Normal mood and affect. Behavior is normal.  RELEVANT LABS AND IMAGING: CBC    Component Value Date/Time   WBC 8.2 11/30/2020 0852   WBC 7.2 03/08/2018 1446   RBC 4.99 11/30/2020 0852   RBC 4.54 03/08/2018 1446   HGB 13.9 11/30/2020 0852   HCT 41.3 11/30/2020 0852   PLT 221 11/30/2020 0852   MCV 83 11/30/2020 0852    MCH 27.9 11/30/2020 0852   MCH 27.1 03/08/2018 1446   MCHC 33.7 11/30/2020 0852   MCHC 32.9 03/08/2018 1446   RDW 14.0 11/30/2020 0852   LYMPHSABS 2.8 11/30/2020 0852   MONOABS 1.2 (H) 03/08/2018 1446   EOSABS 0.2 11/30/2020 0852   BASOSABS 0.1 11/30/2020 0852    CMP     Component Value Date/Time  NA 145 (H) 11/30/2020 0852   K 4.2 11/30/2020 0852   CL 108 (H) 11/30/2020 0852   CO2 18 (L) 11/30/2020 0852   GLUCOSE 96 11/30/2020 0852   GLUCOSE 98 03/08/2018 1446   BUN 3 (L) 11/30/2020 0852   CREATININE 0.84 11/30/2020 0852   CREATININE 0.96 05/05/2017 1215   CALCIUM 9.4 11/30/2020 0852   PROT 6.6 11/30/2020 0852   ALBUMIN 4.2 11/30/2020 0852   AST 22 11/30/2020 0852   ALT 23 11/30/2020 0852   ALKPHOS 105 11/30/2020 0852   BILITOT 0.2 11/30/2020 0852   GFRNONAA 107 04/04/2020 1615   GFRNONAA 110 05/05/2017 1215   GFRAA 123 04/04/2020 1615   GFRAA 128 05/05/2017 1215   ED Course as of 11/16/20 2145  Fri Nov 16, 2020  0129 Cannabinoid Scrn, Ur(!): Positive  0130 Bacteria, UA(!): Moderate  0130 RBC, UA: 3  0130 WBC, UA: 5  0130 Squam Epithel, UA: 5  0130 Granular Casts, UA: 2  0130 Lactate, Venous: 0.7  0130 Sodium: 140  0130 Potassium(!): 3.4  0130 Chloride: 103  0130 CO2: 27.9  0130 Anion Gap: 9  0130 Total Bilirubin(!): 2.2  0130 AST(!): 170  0130 ALT(!): 367  0130 Alkaline Phosphatase(!): 171  0130 Total bili alk phosphatase and transaminases are now elevated along with alkaline phosphatase. Urine cannabinoid drug screen positive  0131 WBC: 8.3  0131 HGB: 14.5  0131 HCT: 42.8  0131 Platelet: 267  0131 Lipase(!): 80  0132 CT scan from 11/11/2020 5 days ago reading specifically for hepatobiliary system:Hepatobiliary: No focal liver abnormality is seen. No gallstones,  Total Protein 6.4 6.0 - 8.0 g/dL   11/17/2020 9:36 AM EDT [facility]    Total Bilirubin 0.6 0.3 - 1.2 mg/dL   11/17/2020 9:36 AM EDT [place]    AST 52  (H) 15 - 40 U/L   11/17/2020 9:36 AM EDT [place]    ALT 187 (H) 12 - 78 U/L   11/17/2020 9:36 AM EDT [facility]    Alkaline Phosphatase 128 (H) 46 - 116 U/L   11/17/2020 9:36 AM EDT [facility]    [place] RUQ W Gallbladder  Result Date: 11/16/2020 CLINICAL DATA: Abdominal pain with elevated liver enzymes EXAM: ULTRASOUND ABDOMEN LIMITED RIGHT UPPER QUADRANT COMPARISON: CT abdomen and pelvis November 11, 2020 FINDINGS: Gallbladder: Within the gallbladder, there is sludge. There are echogenic foci which move and shadow consistent with cholelithiasis. Largest gallstone measures 1.5 cm in length. The gallbladder wall is thickened and edematous with mild pericholecystic fluid. No sonographic Murphy sign noted by sonographer. Common bile duct: Diameter: 7 mm, upper normal in size. No obstructing lesions seen in the biliary ductal system. Note that portions of the extrahepatic biliary ductal system are obscured by gas. No intrahepatic biliary duct dilatation is appreciable. Liver: No focal lesion identified. Liver echogenicity is somewhat inhomogeneous and overall increased. Portal vein is patent on color Doppler imaging with normal direction of blood flow towards the liver. Other: None.   1. Gallstones and sludge in gallbladder. Gallbladder wall appears thickened and mildly edematous with pericholecystic fluid. These are findings felt to be indicative of a degree of acute cholecystitis. 2. Incomplete visualization extrahepatic biliary ducts due to overlying gas. Visualized common bile duct upper normal in size. No intrahepatic biliary duct dilatation evident. 3. Liver echotexture overall increased and somewhat inhomogeneous echotexture. Appearance is suspicious for a degree of hepatic steatosis, potentially with underlying parenchymal liver disease. No focal liver  lesions evident. Note that the sensitivity of ultrasound for detection of focal liver lesions is  somewhat diminished given this underlying liver parenchymal pattern. Electronically Signed By: Lowella Grip III M.D. On: 11/16/2020 09:58   ASSESSMENT/PLAN: 30 year old male with history of severe pan ulcerative colitis refractory to medical therapy who underwent total colectomy with J-pouch creation in 1572 with complicated postoperative course with eventual recovery and near normalization of bowel habits.  He has not had any late complications from his colectomy/J-pouch creation.  Last month he was diagnosed with cholecystitis based on severe abdominal pain, elevated LAEs in a mixed pattern and [place] with with sludge/stones and GBW, but he improved clinically without surgery.  Ulcerative colitis/Colectomy status Currently, the patient is doing very well with no symptoms of pouch dysfunction or complications.  No indication for endoscopic assessment or medical therapy.  Patient reminded of potential complications such as pouchitis and advised to contact our office if he develops symptoms of diarrhea, urgency, bloody stools or tenesmus  Gallstones It is unlikely the patient had true cholecystitis given that he improved without surgical intervention, but his symptoms and liver enzyme elevation were certainly secondary to gallstones.  The patient needs to discuss elective cholecystectomy with a surgeon as he is at high risk for cholecystitis. Will place referral to general surgery.  No symptoms of pouchitis/ileitis.  No need for endoscopy/medical therapy  Apparently had cholecystitis/gallbladder disease which resolved, LAEs normal, no symptoms.  Was told that he had surgery referral, but no one has called.  No referral present.  Will place referral to surgery to discuss potential cholecystectomy  A total of 65 minutes was spent reviewing the patient's medical records (notes, labs, imaging) from [facility], interviewing and examining the patient, and documenting his  care.      IO:MBTDH, Hannah M, [place],  [place] [address]

## 2020-12-30 ENCOUNTER — Emergency Department: Admit: 2020-12-30 | Discharge: 2020-12-30 | Disposition: A | Discharge status: Home / Self Care | Payer: MEDICAID

## 2020-12-30 ENCOUNTER — Ambulatory Visit: Admit: 2020-12-30 | Discharge: 2020-12-30 | Disposition: A | Discharge status: Home / Self Care | Payer: MEDICAID

## 2021-01-10 ENCOUNTER — Encounter: Payer: Medicaid Other | Admitting: Nurse Practitioner

## 2021-01-10 ENCOUNTER — Encounter: Payer: Medicaid Other | Admitting: Family Medicine

## 2021-01-25 NOTE — Progress Notes (Signed)
LVMM for pt to call Preop Nurse in regards to covid test that is needed prior to surgery.  Covid test needs to be done either today or day of surgery.  LVMM for pt in regards to this with call back number of [phone].

## 2021-01-28 ENCOUNTER — Encounter (HOSPITAL_COMMUNITY): Payer: Self-pay

## 2021-01-28 ENCOUNTER — Inpatient Hospital Stay (HOSPITAL_COMMUNITY)
Admission: RE | Admit: 2021-01-28 | Discharge: 2021-01-28 | Disposition: A | Discharge status: Home / Self Care | Payer: Medicaid Other | Source: Ambulatory Visit

## 2021-01-28 ENCOUNTER — Other Ambulatory Visit: Payer: Self-pay

## 2021-01-28 ENCOUNTER — Ambulatory Visit: Payer: Self-pay | Admitting: General Surgery

## 2021-01-28 HISTORY — DX: Pneumonia, unspecified organism: J18.9

## 2021-01-28 HISTORY — DX: Family history of other specified conditions: Z84.89

## 2021-01-28 HISTORY — DX: Headache, unspecified: R51.9

## 2021-01-28 HISTORY — DX: Prediabetes: R73.03

## 2021-01-28 HISTORY — DX: Essential (primary) hypertension: I10

## 2021-01-28 HISTORY — DX: Other complications of anesthesia, initial encounter: T88.59XA

## 2021-01-28 HISTORY — DX: Anemia, unspecified: D64.9

## 2021-01-28 MED ORDER — CLINDAMYCIN PHOSPHATE 900 MG/50ML IV SOLN
900.0000 mg | INTRAVENOUS | Status: AC
  Filled 2021-01-28: qty 50

## 2021-01-28 MED ORDER — GENTAMICIN SULFATE 40 MG/ML IJ SOLN
440.0000 mg | INTRAVENOUS | Status: AC
  Administered 2021-01-29: 440 mg via INTRAVENOUS
  Filled 2021-01-28: qty 11

## 2021-01-28 NOTE — Progress Notes (Signed)
RN called number listed for patient which is his mother Michiel Cowboy Wilson's number.  Left voicemail to return call.

## 2021-01-28 NOTE — Progress Notes (Addendum)
Anesthesia Review:  PCP: DR Demetrius Revel- [facility] - [facility] 11/30/20.  Cardiologist : none Neuro- [facility] - Dr Metta Clines- 10/11/20-LOV  [facility]- Dr Dustin Flock- LOV 12/12/20  Chest x-ray : EKG : 11/17/2020  Echo : 2018  Stress test: Cardiac Cath :  Activity level: can do ao flight of stairs withouit diffculty  Sleep Study/ CPAP : none  Fasting Blood Sugar :      / Checks Blood Sugar -- times a day:   Blood Thinner/ Instructions /Last Dose: ASA / Instructions/ Last Dose :   Prediabetes- per mother - mother stated he has lost 50 lbs over the last 5 months not sure he still is preciabetic.   Hx of seizure- last one years ago per mother  Hypertension-pt states pt has not had Propanolol in a week she has been trying to get refilled and med has not be refilled yet.   PT is HOH and heariing aids do not work per mother.  PT is able to sign consent and is aware of what he is going to have done but she states she always signs consent.   Mother will be bringing him to hospital DOS.   Mother to [place] guardian papers with her.   PT had preop appt on 01/28/21 at 1400 but pt called in this am to PST and stated she was not feeling well  Completed preop as a phone call.   Completed preop hx and instructions with mother via phone.   Mother reports having a " stomach virus"  With N/V and Diarrhea.  Mother reports pt has not symptoms nor amy symptoms of Covid.  Mother stated her N/V stopped 3 days ago and Diarrhea stopped 2 Days ago.   Called [facility] and spoke with Abigail Butts , Triage and made her aware of above.  Abigail Butts stated she would let Dr Redmond Pulling be aware.   Covid Test day of surgery.

## 2021-01-28 NOTE — Progress Notes (Signed)
Wayne Avila returned call for patient.  Mr. Vanderpol has not had is preop covid testing.  Mother verbalized understanding to have patient arrive to [facility] tomorrow at 9:45 for Covid test and surgery.

## 2021-01-28 NOTE — Progress Notes (Signed)
DUE TO COVID-19 ONLY ONE VISITOR IS ALLOWED TO COME WITH YOU AND STAY IN THE WAITING ROOM ONLY DURING PRE OP AND PROCEDURE DAY OF SURGERY.  2 VISITOR  MAY VISIT WITH YOU AFTER SURGERY IN YOUR PRIVATE ROOM DURING VISITING HOURS ONLY!  COVID TEST ON ARRIVAL   Your procedure is scheduled on:               01/29/2021.    Report to [facility] Main  Entrance   Report to admitting at   0930 AM  Bring Insurance Card and picture ID.   Mother is Guardian.  She is to bring paperwork.     Call this number if you have problems the morning of surgery [phone]    REMEMBER: NO  SOLID FOOD CANDY OR GUM AFTER MIDNIGHT. CLEAR LIQUIDS UNTIL      0930am      . NOTHING BY MOUTH EXCEPT CLEAR LIQUIDS UNTIL  0930am  .    CLEAR LIQUID DIET   Foods Allowed                                                                    Coffee and tea, regular and decaf                            Fruit ices (not with fruit pulp)                                      Iced Popsicles                                    Carbonated beverages, regular and diet                                    Cranberry, grape and apple juices Sports drinks like Gatorade Lightly seasoned clear broth or consume(fat free) Sugar,  ___________________________________________________________________      BRUSH YOUR TEETH MORNING OF SURGERY AND RINSE YOUR MOUTH OUT, NO CHEWING GUM CANDY OR MINTS.     Take these medicines the morning of surgery with A SIP OF WATER:  Nebulizer if needed, Inhalers as usual and bring, claritin, protonix, flonase if needed  DO NOT TAKE ANY DIABETIC MEDICATIONS DAY OF YOUR SURGERY                               You may not have any metal on your body including hair pins and              piercings  Do not wear jewelry, make-up, lotions, powders or perfumes, deodorant             Do not wear nail polish on your fingernails.  Do not shave  48 hours prior to surgery.              Men may shave face and  neck.   Do not  bring valuables to the hospital. [place].  Contacts, dentures or bridgework may not be worn into surgery.  Leave suitcase in the car. After surgery it may be brought to your room.     Patients discharged the day of surgery will not be allowed to drive home. IF YOU ARE HAVING SURGERY AND GOING HOME THE SAME DAY, YOU MUST HAVE AN ADULT TO DRIVE YOU HOME AND BE WITH YOU FOR 24 HOURS. YOU MAY GO HOME BY TAXI OR UBER OR ORTHERWISE, BUT AN ADULT MUST ACCOMPANY YOU HOME AND STAY WITH YOU FOR 24 HOURS.  Name and phone number of your driver:  Special Instructions: N/A              Please read over the following fact sheets you were given: _____________________________________________________________________  [facility] - Preparing for [facility] Before surgery, you can play an important role.  Because skin is not sterile, your skin needs to be as free of germs as possible.  You can reduce the number of germs on your skin by washing with CHG (chlorahexidine gluconate) soap before surgery.  CHG is an antiseptic cleaner which kills germs and bonds with the skin to continue killing germs even after washing. Please DO NOT use if you have an allergy to CHG or antibacterial soaps.  If your skin becomes reddened/irritated stop using the CHG and inform your nurse when you arrive at Short Stay. Do not shave (including legs and underarms) for at least 48 hours prior to the first CHG shower.  You may shave your face/neck. Please follow these instructions carefully:  1.  Shower with  Soap the night before surgery and the  morning of Surgery.  2.  If you choose to wash your hair, wash your hair first as usual with your  normal  shampoo.  3.  After you shampoo, rinse your hair and body thoroughly to remove the  shampoo.                           4.  Use CHG as you would any other liquid soap.  You can apply chg directly  to the skin and wash                        Gently with a scrungie or clean washcloth.  5.  Apply the Soap to your body ONLY FROM THE NECK DOWN.   Do not use on face/ open                           Wound or open sores. Avoid contact with eyes, ears mouth and genitals (private parts).                       Wash face,  Genitals (private parts) with your normal soap.             6.  Wash thoroughly, paying special attention to the area where your surgery  will be performed.  7.  Thoroughly rinse your body with warm water from the neck down.  8.  DO NOT shower/wash with your normal soap after using and rinsing off  the CHG Soap.                9.  Pat yourself dry with a clean towel.            10.  Wear clean pajamas.            11.  Place clean sheets on your bed the night of your first shower and do not  sleep with pets. Day of Surgery : Do not apply any lotions/deodorants the morning of surgery.  Please wear clean clothes to the hospital/surgery center.  FAILURE TO FOLLOW THESE INSTRUCTIONS MAY RESULT IN THE CANCELLATION OF YOUR SURGERY PATIENT SIGNATURE_________________________________  NURSE SIGNATURE__________________________________  ________________________________________________________________________   Reviewed preop instructions with Donaciano Eva, mother, guardian,  Mother voiced understanding.

## 2021-01-29 ENCOUNTER — Ambulatory Visit (HOSPITAL_COMMUNITY): Payer: Medicaid Other | Admitting: Certified Registered Nurse Anesthetist

## 2021-01-29 ENCOUNTER — Encounter (HOSPITAL_COMMUNITY)
Admission: RE | Disposition: A | Discharge status: Home / Self Care | Payer: Self-pay | Source: Home / Self Care | Attending: General Surgery

## 2021-01-29 ENCOUNTER — Encounter: Payer: Self-pay | Admitting: Nurse Practitioner

## 2021-01-29 ENCOUNTER — Other Ambulatory Visit: Payer: Self-pay | Admitting: Nurse Practitioner

## 2021-01-29 ENCOUNTER — Ambulatory Visit (HOSPITAL_COMMUNITY)
Admission: RE | Admit: 2021-01-29 | Discharge: 2021-01-29 | Disposition: A | Discharge status: Home / Self Care | Payer: Medicaid Other | Attending: General Surgery | Admitting: General Surgery

## 2021-01-29 ENCOUNTER — Encounter (HOSPITAL_COMMUNITY): Payer: Self-pay | Admitting: General Surgery

## 2021-01-29 DIAGNOSIS — R109 Unspecified abdominal pain: Secondary | ICD-10-CM | POA: Diagnosis present

## 2021-01-29 DIAGNOSIS — F319 Bipolar disorder, unspecified: Secondary | ICD-10-CM | POA: Insufficient documentation

## 2021-01-29 DIAGNOSIS — Z20822 Contact with and (suspected) exposure to covid-19: Secondary | ICD-10-CM | POA: Diagnosis not present

## 2021-01-29 DIAGNOSIS — Z886 Allergy status to analgesic agent status: Secondary | ICD-10-CM | POA: Insufficient documentation

## 2021-01-29 DIAGNOSIS — Z8719 Personal history of other diseases of the digestive system: Secondary | ICD-10-CM | POA: Diagnosis not present

## 2021-01-29 DIAGNOSIS — Z79899 Other long term (current) drug therapy: Secondary | ICD-10-CM | POA: Insufficient documentation

## 2021-01-29 DIAGNOSIS — Z9049 Acquired absence of other specified parts of digestive tract: Secondary | ICD-10-CM | POA: Diagnosis not present

## 2021-01-29 DIAGNOSIS — Z888 Allergy status to other drugs, medicaments and biological substances status: Secondary | ICD-10-CM | POA: Diagnosis not present

## 2021-01-29 DIAGNOSIS — F901 Attention-deficit hyperactivity disorder, predominantly hyperactive type: Secondary | ICD-10-CM

## 2021-01-29 DIAGNOSIS — K8010 Calculus of gallbladder with chronic cholecystitis without obstruction: Secondary | ICD-10-CM | POA: Diagnosis not present

## 2021-01-29 DIAGNOSIS — Z880 Allergy status to penicillin: Secondary | ICD-10-CM | POA: Diagnosis not present

## 2021-01-29 HISTORY — PX: LYSIS OF ADHESION: SHX5961

## 2021-01-29 HISTORY — PX: CHOLECYSTECTOMY: SHX55

## 2021-01-29 LAB — COMPREHENSIVE METABOLIC PANEL WITH GFR
ALT: 19 U/L (ref 0–44)
AST: 23 U/L (ref 15–41)
Albumin: 4.3 g/dL (ref 3.5–5.0)
Alkaline Phosphatase: 68 U/L (ref 38–126)
Anion gap: 11 (ref 5–15)
BUN: 10 mg/dL (ref 6–20)
CO2: 22 mmol/L (ref 22–32)
Calcium: 9.6 mg/dL (ref 8.9–10.3)
Chloride: 102 mmol/L (ref 98–111)
Creatinine, Ser: 0.96 mg/dL (ref 0.61–1.24)
GFR, Estimated: >60 mL/min
Glucose, Bld: 104 mg/dL — ABNORMAL HIGH (ref 70–99)
Potassium: 4.1 mmol/L (ref 3.5–5.1)
Sodium: 135 mmol/L (ref 135–145)
Total Bilirubin: 0.6 mg/dL (ref 0.3–1.2)
Total Protein: 7.6 g/dL (ref 6.5–8.1)

## 2021-01-29 LAB — CBC WITH DIFFERENTIAL/PLATELET
Abs Immature Granulocytes: 0.03 10*3/uL (ref 0.00–0.07)
Basophils Absolute: 0.1 10*3/uL (ref 0.0–0.1)
Basophils Relative: 1 %
Eosinophils Absolute: 0.2 10*3/uL (ref 0.0–0.5)
Eosinophils Relative: 2 %
HCT: 45.7 % (ref 39.0–52.0)
Hemoglobin: 15.2 g/dL (ref 13.0–17.0)
Immature Granulocytes: 0 %
Lymphocytes Relative: 27 %
Lymphs Abs: 2.5 10*3/uL (ref 0.7–4.0)
MCH: 28.4 pg (ref 26.0–34.0)
MCHC: 33.3 g/dL (ref 30.0–36.0)
MCV: 85.4 fL (ref 80.0–100.0)
Monocytes Absolute: 0.5 10*3/uL (ref 0.1–1.0)
Monocytes Relative: 6 %
Neutro Abs: 5.8 10*3/uL (ref 1.7–7.7)
Neutrophils Relative %: 64 %
Platelets: 243 10*3/uL (ref 150–400)
RBC: 5.35 MIL/uL (ref 4.22–5.81)
RDW: 13.6 % (ref 11.5–15.5)
WBC: 9 10*3/uL (ref 4.0–10.5)
nRBC: 0 % (ref 0.0–0.2)

## 2021-01-29 LAB — SARS CORONAVIRUS 2 BY RT PCR (HOSPITAL ORDER, PERFORMED IN ~~LOC~~ HOSPITAL LAB): SARS Coronavirus 2: NEGATIVE

## 2021-01-29 SURGERY — LAPAROSCOPIC CHOLECYSTECTOMY WITH INTRAOPERATIVE CHOLANGIOGRAM
Anesthesia: General | Site: Abdomen

## 2021-01-29 MED ORDER — INDOCYANINE GREEN 25 MG IV SOLR
7.5000 mg | Freq: Once | INTRAVENOUS | Status: AC
  Administered 2021-01-29: 7.5 mg via INTRAVENOUS
  Filled 2021-01-29: qty 3

## 2021-01-29 MED ORDER — OXYCODONE HCL 5 MG/5ML PO SOLN: 5.0000 mg | Freq: Once | ORAL | Status: DC | PRN

## 2021-01-29 MED ORDER — PROPOFOL 10 MG/ML IV BOLUS
INTRAVENOUS | Status: DC | PRN
  Administered 2021-01-29: 200 mg via INTRAVENOUS

## 2021-01-29 MED ORDER — OXYCODONE HCL 5 MG PO TABS: 5.0000 mg | ORAL_TABLET | Freq: Four times a day (QID) | ORAL | 0 refills | Status: DC | PRN

## 2021-01-29 MED ORDER — ACETAMINOPHEN 500 MG PO TABS: 1000.0000 mg | ORAL_TABLET | Freq: Three times a day (TID) | ORAL | 0 refills | Status: AC

## 2021-01-29 MED ORDER — SUGAMMADEX SODIUM 500 MG/5ML IV SOLN
INTRAVENOUS | Status: DC | PRN
  Administered 2021-01-29: 300 mg via INTRAVENOUS

## 2021-01-29 MED ORDER — ROCURONIUM BROMIDE 10 MG/ML (PF) SYRINGE
PREFILLED_SYRINGE | INTRAVENOUS | Status: AC
  Filled 2021-01-29: qty 10

## 2021-01-29 MED ORDER — DEXAMETHASONE SODIUM PHOSPHATE 10 MG/ML IJ SOLN
INTRAMUSCULAR | Status: AC
  Filled 2021-01-29: qty 1

## 2021-01-29 MED ORDER — FENTANYL CITRATE (PF) 100 MCG/2ML IJ SOLN
INTRAMUSCULAR | Status: AC
  Filled 2021-01-29: qty 2

## 2021-01-29 MED ORDER — BUPIVACAINE-EPINEPHRINE (PF) 0.25% -1:200000 IJ SOLN
INTRAMUSCULAR | Status: AC
  Filled 2021-01-29: qty 30

## 2021-01-29 MED ORDER — OXYCODONE HCL 5 MG PO TABS: 5.0000 mg | ORAL_TABLET | Freq: Once | ORAL | Status: DC | PRN

## 2021-01-29 MED ORDER — CHLORHEXIDINE GLUCONATE CLOTH 2 % EX PADS
6.0000 | MEDICATED_PAD | Freq: Once | CUTANEOUS | Status: AC
  Administered 2021-01-29: 6 via TOPICAL

## 2021-01-29 MED ORDER — ONDANSETRON HCL 4 MG/2ML IJ SOLN
INTRAMUSCULAR | Status: AC
  Filled 2021-01-29: qty 2

## 2021-01-29 MED ORDER — ORAL CARE MOUTH RINSE: 15.0000 mL | Freq: Once | OROMUCOSAL | Status: AC

## 2021-01-29 MED ORDER — LACTATED RINGERS IR SOLN
Status: DC | PRN
  Administered 2021-01-29: 1000 mL

## 2021-01-29 MED ORDER — DEXMEDETOMIDINE (PRECEDEX) IN NS 20 MCG/5ML (4 MCG/ML) IV SYRINGE
PREFILLED_SYRINGE | INTRAVENOUS | Status: AC
  Filled 2021-01-29: qty 5

## 2021-01-29 MED ORDER — DIPHENHYDRAMINE HCL 50 MG/ML IJ SOLN
INTRAMUSCULAR | Status: AC
  Filled 2021-01-29: qty 1

## 2021-01-29 MED ORDER — CLINDAMYCIN PHOSPHATE 900 MG/50ML IV SOLN
900.0000 mg | Freq: Once | INTRAVENOUS | Status: AC
  Administered 2021-01-29: 900 mg via INTRAVENOUS

## 2021-01-29 MED ORDER — PROPRANOLOL HCL 10 MG PO TABS: 10.0000 mg | ORAL_TABLET | Freq: Three times a day (TID) | ORAL | 5 refills | Status: DC

## 2021-01-29 MED ORDER — FENTANYL CITRATE (PF) 100 MCG/2ML IJ SOLN
INTRAMUSCULAR | Status: DC | PRN
  Administered 2021-01-29 (×6): 50 ug via INTRAVENOUS

## 2021-01-29 MED ORDER — ACETAMINOPHEN 500 MG PO TABS
1000.0000 mg | ORAL_TABLET | ORAL | Status: AC
  Administered 2021-01-29: 1000 mg via ORAL
  Filled 2021-01-29: qty 2

## 2021-01-29 MED ORDER — PROPOFOL 10 MG/ML IV BOLUS
INTRAVENOUS | Status: AC
  Filled 2021-01-29: qty 20

## 2021-01-29 MED ORDER — SUGAMMADEX SODIUM 500 MG/5ML IV SOLN
INTRAVENOUS | Status: AC
  Filled 2021-01-29: qty 5

## 2021-01-29 MED ORDER — ROCURONIUM BROMIDE 10 MG/ML (PF) SYRINGE
PREFILLED_SYRINGE | INTRAVENOUS | Status: DC | PRN
Start: 2021-01-29 — End: 2021-01-29
  Administered 2021-01-29: 20 mg via INTRAVENOUS
  Administered 2021-01-29: 70 mg via INTRAVENOUS
  Administered 2021-01-29: 20 mg via INTRAVENOUS

## 2021-01-29 MED ORDER — PHENYLEPHRINE 40 MCG/ML (10ML) SYRINGE FOR IV PUSH (FOR BLOOD PRESSURE SUPPORT)
PREFILLED_SYRINGE | INTRAVENOUS | Status: DC | PRN
  Administered 2021-01-29: 80 ug via INTRAVENOUS
  Administered 2021-01-29: 40 ug via INTRAVENOUS

## 2021-01-29 MED ORDER — TRAZODONE HCL 150 MG PO TABS: 150.0000 mg | ORAL_TABLET | Freq: Every day | ORAL | 1 refills | Status: DC

## 2021-01-29 MED ORDER — DIPHENHYDRAMINE HCL 50 MG/ML IJ SOLN
INTRAMUSCULAR | Status: DC | PRN
  Administered 2021-01-29: 12.5 mg via INTRAVENOUS

## 2021-01-29 MED ORDER — LIDOCAINE 2% (20 MG/ML) 5 ML SYRINGE
INTRAMUSCULAR | Status: DC | PRN
  Administered 2021-01-29: 80 mg via INTRAVENOUS

## 2021-01-29 MED ORDER — LIDOCAINE 2% (20 MG/ML) 5 ML SYRINGE
INTRAMUSCULAR | Status: AC
  Filled 2021-01-29: qty 5

## 2021-01-29 MED ORDER — BUPIVACAINE-EPINEPHRINE 0.25% -1:200000 IJ SOLN
INTRAMUSCULAR | Status: DC | PRN
  Administered 2021-01-29: 30 mL

## 2021-01-29 MED ORDER — LACTATED RINGERS IV SOLN: INTRAVENOUS | Status: DC

## 2021-01-29 MED ORDER — DEXAMETHASONE SODIUM PHOSPHATE 10 MG/ML IJ SOLN
INTRAMUSCULAR | Status: DC | PRN
  Administered 2021-01-29: 8 mg via INTRAVENOUS

## 2021-01-29 MED ORDER — PROMETHAZINE HCL 25 MG/ML IJ SOLN: 6.2500 mg | INTRAMUSCULAR | Status: DC | PRN

## 2021-01-29 MED ORDER — FENTANYL CITRATE (PF) 100 MCG/2ML IJ SOLN: 25.0000 ug | INTRAMUSCULAR | Status: DC | PRN

## 2021-01-29 MED ORDER — CHLORHEXIDINE GLUCONATE CLOTH 2 % EX PADS: 6.0000 | MEDICATED_PAD | Freq: Once | CUTANEOUS | Status: DC

## 2021-01-29 MED ORDER — MIDAZOLAM HCL 5 MG/5ML IJ SOLN
INTRAMUSCULAR | Status: DC | PRN
  Administered 2021-01-29: 2 mg via INTRAVENOUS

## 2021-01-29 MED ORDER — CHLORHEXIDINE GLUCONATE 0.12 % MT SOLN
15.0000 mL | Freq: Once | OROMUCOSAL | Status: AC
  Administered 2021-01-29: 15 mL via OROMUCOSAL

## 2021-01-29 MED ORDER — MIDAZOLAM HCL 2 MG/2ML IJ SOLN
INTRAMUSCULAR | Status: AC
  Filled 2021-01-29: qty 2

## 2021-01-29 MED ORDER — PHENYLEPHRINE 40 MCG/ML (10ML) SYRINGE FOR IV PUSH (FOR BLOOD PRESSURE SUPPORT)
PREFILLED_SYRINGE | INTRAVENOUS | Status: AC
  Filled 2021-01-29: qty 10

## 2021-01-29 MED ORDER — ONDANSETRON HCL 4 MG/2ML IJ SOLN
INTRAMUSCULAR | Status: DC | PRN
  Administered 2021-01-29: 4 mg via INTRAVENOUS

## 2021-01-29 MED ORDER — DEXMEDETOMIDINE (PRECEDEX) IN NS 20 MCG/5ML (4 MCG/ML) IV SYRINGE
PREFILLED_SYRINGE | INTRAVENOUS | Status: DC | PRN
  Administered 2021-01-29: 8 ug via INTRAVENOUS

## 2021-01-29 SURGICAL SUPPLY — 60 items
ADH SKN CLS APL DERMABOND .7 (GAUZE/BANDAGES/DRESSINGS) ×1
APL PRP STRL LF DISP 70% ISPRP (MISCELLANEOUS) ×1
APL SKNCLS STERI-STRIP NONHPOA (GAUZE/BANDAGES/DRESSINGS)
APL SRG 38 LTWT LNG FL B (MISCELLANEOUS)
APPLICATOR ARISTA FLEXITIP XL (MISCELLANEOUS) IMPLANT
APPLIER CLIP 5 13 M/L LIGAMAX5 (MISCELLANEOUS) ×2
APPLIER CLIP ROT 10 11.4 M/L (STAPLE)
APR CLP MED LRG 11.4X10 (STAPLE)
APR CLP MED LRG 5 ANG JAW (MISCELLANEOUS) ×1
BAG COUNTER SPONGE SURGICOUNT (BAG) IMPLANT
BAG SPEC RTRVL 10 TROC 200 (ENDOMECHANICALS) ×1
BAG SPNG CNTER NS LX DISP (BAG)
BENZOIN TINCTURE PRP APPL 2/3 (GAUZE/BANDAGES/DRESSINGS) IMPLANT
BNDG ADH 1X3 SHEER STRL LF (GAUZE/BANDAGES/DRESSINGS) ×8 IMPLANT
BNDG ADH THN 3X1 STRL LF (GAUZE/BANDAGES/DRESSINGS) ×4
CABLE HIGH FREQUENCY MONO STRZ (ELECTRODE) ×2 IMPLANT
CHLORAPREP W/TINT 26 (MISCELLANEOUS) ×2 IMPLANT
CLIP APPLIE 5 13 M/L LIGAMAX5 (MISCELLANEOUS) IMPLANT
CLIP APPLIE ROT 10 11.4 M/L (STAPLE) IMPLANT
CLIP LIGATING HEMO O LOK GREEN (MISCELLANEOUS) IMPLANT
COVER MAYO STAND STRL (DRAPES) IMPLANT
COVER SURGICAL LIGHT HANDLE (MISCELLANEOUS) ×2 IMPLANT
DECANTER SPIKE VIAL GLASS SM (MISCELLANEOUS) ×1 IMPLANT
DERMABOND ADVANCED (GAUZE/BANDAGES/DRESSINGS) ×1
DERMABOND ADVANCED .7 DNX12 (GAUZE/BANDAGES/DRESSINGS) IMPLANT
DRAPE C-ARM 42X120 X-RAY (DRAPES) IMPLANT
DRSG TEGADERM 2-3/8X2-3/4 SM (GAUZE/BANDAGES/DRESSINGS) IMPLANT
ELECT REM PT RETURN 15FT ADLT (MISCELLANEOUS) ×2 IMPLANT
GAUZE SPONGE 2X2 8PLY STRL LF (GAUZE/BANDAGES/DRESSINGS) IMPLANT
GLOVE SRG 8 PF TXTR STRL LF DI (GLOVE) ×1 IMPLANT
GLOVE SURG MICRO LTX SZ7.5 (GLOVE) ×2 IMPLANT
GLOVE SURG UNDER POLY LF SZ8 (GLOVE) ×2
GOWN STRL REUS W/TWL XL LVL3 (GOWN DISPOSABLE) ×6 IMPLANT
GRASPER SUT TROCAR 14GX15 (MISCELLANEOUS) ×1 IMPLANT
HEMOSTAT ARISTA ABSORB 3G PWDR (HEMOSTASIS) IMPLANT
HEMOSTAT SNOW SURGICEL 2X4 (HEMOSTASIS) IMPLANT
KIT BASIN OR (CUSTOM PROCEDURE TRAY) ×2 IMPLANT
KIT TURNOVER KIT A (KITS) ×2 IMPLANT
L-HOOK LAP DISP 36CM (ELECTROSURGICAL) ×2
LHOOK LAP DISP 36CM (ELECTROSURGICAL) IMPLANT
POUCH RETRIEVAL ECOSAC 10 (ENDOMECHANICALS) ×1 IMPLANT
POUCH RETRIEVAL ECOSAC 10MM (ENDOMECHANICALS) ×2
SCISSORS LAP 5X35 DISP (ENDOMECHANICALS) ×2 IMPLANT
SET CHOLANGIOGRAPH MIX (MISCELLANEOUS) IMPLANT
SET IRRIG TUBING LAPAROSCOPIC (IRRIGATION / IRRIGATOR) ×2 IMPLANT
SET TUBE SMOKE EVAC HIGH FLOW (TUBING) ×2 IMPLANT
SLEEVE XCEL OPT CAN 5 100 (ENDOMECHANICALS) ×6 IMPLANT
SPONGE GAUZE 2X2 STER 10/PKG (GAUZE/BANDAGES/DRESSINGS)
STRIP CLOSURE SKIN 1/2X4 (GAUZE/BANDAGES/DRESSINGS) IMPLANT
SUT MNCRL AB 4-0 PS2 18 (SUTURE) ×2 IMPLANT
SUT VIC AB 0 UR5 27 (SUTURE) ×1 IMPLANT
SUT VICRYL 0 ENDOLOOP (SUTURE) ×1 IMPLANT
SUT VICRYL 0 TIES 12 18 (SUTURE) IMPLANT
SUT VICRYL 0 UR6 27IN ABS (SUTURE) IMPLANT
TOWEL OR 17X26 10 PK STRL BLUE (TOWEL DISPOSABLE) ×2 IMPLANT
TOWEL OR NON WOVEN STRL DISP B (DISPOSABLE) ×2 IMPLANT
TRAY LAPAROSCOPIC (CUSTOM PROCEDURE TRAY) ×2 IMPLANT
TROCAR BLADELESS OPT 5 100 (ENDOMECHANICALS) ×2 IMPLANT
TROCAR XCEL BLUNT TIP 100MML (ENDOMECHANICALS) IMPLANT
TROCAR XCEL NON-BLD 11X100MML (ENDOMECHANICALS) ×1 IMPLANT

## 2021-01-29 NOTE — Anesthesia Postprocedure Evaluation (Signed)
Anesthesia Post Note  Patient: WENDAL WILKIE  Procedure(s) Performed: LAPAROSCOPIC CHOLECYSTECTOMY WITH INTRAOPERATIVE CHOLANGIOGRAM AND ICG GYE , POSSIBLE OPEN (Abdomen) LYSIS OF ADHESION (Abdomen)     Patient location during evaluation: PACU Anesthesia Type: General Level of consciousness: awake and alert Pain management: pain level controlled Vital Signs Assessment: post-procedure vital signs reviewed and stable Respiratory status: spontaneous breathing, nonlabored ventilation and respiratory function stable Cardiovascular status: stable and blood pressure returned to baseline Anesthetic complications: no   No notable events documented.  Last Vitals:  Vitals:   01/29/21 1530 01/29/21 1545  BP: 138/89 (!) 137/93  Pulse: 90 82  Resp: 15 14  Temp:    SpO2: 96% 98%    Last Pain:  Vitals:   01/29/21 1530  TempSrc:   PainSc: [place] Rachael Ferrie

## 2021-01-29 NOTE — Telephone Encounter (Signed)
I sent them in.

## 2021-01-29 NOTE — H&P (Signed)
REFERRING PHYSICIAN: Rosamaria Lints, MD  PROVIDER: Romayne Ticas Leanne Chang, MD  MRN: [id number] DOB: 27-May-1991 DATE OF ENCOUNTER: 01/17/2021  Subjective   Chief Complaint: Abdominal Pain   History of Present Illness: Wayne Avila is a 30 y.o. male who is seen today as an office consultation at the request of Dr. Candis Schatz for evaluation of Abdominal Pain .   Patient has a history of ulcerative colitis and underwent total abdominal colectomy with end ileostomy on Oct 11, 2018 at [facility] followed by completion proctectomy with J-pouch creation on 02/27/2019. This was reportedly complicated by contained bowel perforation managed with 2 perihepatic percutaneous drains, antibiotics and TPN.  In June he developed severe abdominal pain nausea and vomiting. Initially had a CT scan and labs that were unremarkable and since he was sent home. A few days later he experienced the same complaints. He had elevated liver enzymes and a mixed pattern and had an ultrasound demonstrating stones and sludge and gallbladder wall thickening. He was diagnosed with cholecystitis. It sounds like they had a hard time finding a surgeon to accept him and he ended up spending 2 days in the emergency room until there is a bed available in [place]. He was airlifted to [place] but upon his arrival he was clinically improving and his LFTs were downtrending. They deferred his surgery since his exam was clinically stable. He has established care with Dr. Candis Schatz in gastroenterology in the [place] area  He is accompanied by his mother today. He states that he is doing well. He has not had an additional episode of epigastric and right upper quadrant pain since that event in June. No nausea or vomiting. Has several bowel movements today. States that his bowel movement frequency is at his baseline.  Review of Systems: A complete review of systems was obtained from the patient. I have reviewed this information and discussed as  appropriate with the patient. See HPI as well for other ROS.  ROS   Medical History: Past Medical History:  Diagnosis Date   Anxiety   Asthma, unspecified asthma severity, unspecified whether complicated, unspecified whether persistent   Bowel perforation (CMS-HCC) 02/2019  managed by 2 perc drains, [facility]   Hypertension   Liver disease   Patient Active Problem List  Diagnosis   Attention-deficit hyperactivity disorder, unspecified type   Asthma   Bipolar 1 disorder (CMS-HCC)   Past Surgical History:  Procedure Laterality Date   COLECTOMY TOTAL ABDOMINAL W/ILEOSTOMY/ILEOPROCTOSTOMY 2020  TAC with end ileostomy 10/11/18 followed by completion prcotectomy with J pouch 02/2019   tonsilectomy  and adnoids   tonsils and adnoids    Allergies  Allergen Reactions   Infliximab Other (See Comments)  AUTOIMMUNE HEPATITIS AUTOIMMUNE HEPATITIS   Nsaids (Non-Steroidal Anti-Inflammatory Drug) Other (See Comments) and Rash  Should not take 2/2 GI condition Other reaction(s): Other (See Comments) Should not take due to GI condition   Pineapple Other (See Comments) and Swelling  Reaction: Lip swelling Reaction: Lip swelling   Strawberry Swelling  Reaction: Lip swelling Reaction: Lip swelling   Tolmetin Other (See Comments)  Should not take due to GI condition   Tomato Rash and Swelling   Amoxicillin-Pot Clavulanate Nausea And Vomiting, Nausea and Other (See Comments)  GI intolerance GI intolerance   Omeprazole Nausea And Vomiting   Current Outpatient Medications on File Prior to Visit  Medication Sig Dispense Refill   albuterol 90 mcg/actuation inhaler Inhale into the lungs   cholecalciferol (VITAMIN D3) 1000 unit capsule Take 1,000 Units by  mouth once daily   EPINEPHrine (EPIPEN JR) 0.15 mg/0.3 mL auto-injector Inject into the muscle at bedtime as needed   fluticasone propionate (FLONASE) 50 mcg/actuation nasal spray SHAKE LIQUID AND USE 2 SPRAYS IN EACH NOSTRIL DAILY    loratadine (CLARITIN) 10 mg tablet Take 1 tablet by mouth once daily   ondansetron (ZOFRAN-ODT) 4 MG disintegrating tablet   pantoprazole (PROTONIX) 20 MG DR tablet   propranoloL (INDERAL) 10 MG tablet Take 10 mg by mouth 3 (three) times daily   SUMAtriptan (IMITREX) 100 MG tablet   traZODone (DESYREL) 150 MG tablet   No current facility-administered medications on file prior to visit.   Family History  Problem Relation Age of Onset   Obesity Mother   High blood pressure (Hypertension) Mother   Hyperlipidemia (Elevated cholesterol) Mother   Deep vein thrombosis (DVT or abnormal blood clot formation) Mother   Diabetes Mother   High blood pressure (Hypertension) Father   Breast cancer Maternal Grandmother    Social History   Tobacco Use  Smoking Status Never Smoker  Smokeless Tobacco Never Used    Social History   Socioeconomic History   Marital status: Single  Tobacco Use   Smoking status: Never Smoker   Smokeless tobacco: Never Used  Scientific laboratory technician Use: Never used  Substance and Sexual Activity   Alcohol use: Never   Drug use: Never   Objective:   Vitals:  01/17/21 1350  BP: 132/82  Pulse: 95  Temp: 36.6 C (97.9 F)  SpO2: 98%  Weight: (!) 115.5 kg (254 lb 9.6 oz)  Height: 172.7 cm (5' 8" )  PainSc: 0-No pain   Body mass index is 38.71 kg/m.  Constitutional: NAD; conversant; no deformities Eyes: Moist conjunctiva; no lid lag; anicteric; PERRL Neck: Trachea midline; no thyromegaly Lungs: Normal respiratory effort; no tactile fremitus CV: RRR; no palpable thrills; no pitting edema GI: Abd soft, nontender, has a transverse old ostomy site in the right upper quadrant and a well-healed midline incision both of which have keloids; no palpable hepatosplenomegaly MSK: Normal gait; no clubbing/cyanosis Psychiatric: Appropriate affect; alert and oriented x3 Lymphatic: No palpable cervical or axillary lymphadenopathy Skin: No rash, lesions or edema  Labs,  Imaging and Diagnostic Testing: He had an ultrasound November 16, 2020 that showed sludge in the gallbladder, gallstones, largest gallstone measured 1.5 cm. Gallbladder was thickened and edematous with mild fluid. Common bile duct was 7 mm, no intra hepatic biliary ductal dilatation. Liver had appearance of of potential hepatic steatosis  CT scan June 5 showed expected postoperative changes from having large bowel resected otherwise no abnormalities.  I reviewed his labs from his ED visit in June. He presented with an elevated bilirubin of 2.2 AST 170, ALT 367 alkaline phosphatase 171. Normal CBC. Over the next 48 hours his LFTs improved.  I reviewed his first CT scan from February 20, 2019 when he became sick after his completion proctectomy he had extravasation of oral contrast anterior and superior to the liver  I reviewed the surgeons operative note from September 2020. There is a fair amount of intra-abdominal adhesions as well as adhesions to the anterior abdominal wall resulting in serosal tears  Assessment and Plan:  Diagnoses and all orders for this visit:  Calculus of gallbladder with chronic cholecystitis without obstruction  H/O ulcerative colitis  Bipolar 1 disorder (CMS-HCC)  Attention deficit hyperactivity disorder (ADHD), unspecified ADHD type  History of total colectomy   I believe the patient's symptoms are consistent with  gallbladder disease.  We discussed gallbladder disease. The patient was given Neurosurgeon. We discussed non-operative and operative management. We discussed the signs & symptoms of acute cholecystitis  I discussed laparoscopic cholecystectomy with possible IOC & possible open in detail. The patient was given educational material as well as diagrams detailing the procedure. We discussed the risks and benefits of a laparoscopic cholecystectomy including, but not limited to bleeding, infection, injury to surrounding structures such as the intestine  or liver, bile leak, retained gallstones, need to convert to an open procedure, prolonged diarrhea, blood clots such as DVT, common bile duct injury, anesthesia risks, and possible need for additional procedures. We discussed the typical post-operative recovery course. I explained that the likelihood of improvement of their symptoms is good.  Did explain to him and his mother that the likelihood of having to convert to open is over 50% of my opinion given his surgical history as well as his surgical complication with the perforation being located in the right upper quadrant requiring perihepatic drains as well as reviewing the surgeons operative note. Did discuss that he is at increased risk for enterotomy requiring either primary repair and/or potential bowel resection.  I do think he is at significant risk for ongoing gallbladder episodes and attacks. We discussed waiting till surgery until he has acute cholecystitis but my concern with that would be the additional technical difficulty of dealing with an acutely inflamed gallbladder in the setting of potential extensive intra-abdominal adhesions which I think would increase his risk. So therefore while an elective cholecystectomy is not low risk in him I think overall it would be the safest course of action.  They have decided to proceed with surgery  This patient encounter took 63 minutes today to perform the following: take history, perform exam, review outside records, interpret imaging, counsel the patient on their diagnosis and document encounter, findings & plan in the EHR  No follow-ups on file.  Fitz Matsuo Leanne Chang, MD  General, Minimally Invasive, & Bariatric Surgery  Leighton Ruff. Redmond Pulling, MD, FACS General, Bariatric, & Minimally Invasive Surgery [facility], [place]

## 2021-01-29 NOTE — Transfer of Care (Signed)
Immediate Anesthesia Transfer of Care Note  Patient: AYVION KAVANAGH  Procedure(s) Performed: LAPAROSCOPIC CHOLECYSTECTOMY WITH INTRAOPERATIVE CHOLANGIOGRAM AND ICG GYE , POSSIBLE OPEN (Abdomen) LYSIS OF ADHESION (Abdomen)  Patient Location: PACU  Anesthesia Type:General  Level of Consciousness: awake, drowsy and patient cooperative  Airway & Oxygen Therapy: Patient Spontanous Breathing and Patient connected to face mask oxygen  Post-op Assessment: Report given to RN and Post -op Vital signs reviewed and stable  Post vital signs: Reviewed and stable  Last Vitals:  Vitals Value Taken Time  BP 138/92 01/29/21 1445  Temp    Pulse 96 01/29/21 1446  Resp 18 01/29/21 1446  SpO2 100 % 01/29/21 1446  Vitals shown include unvalidated device data.  Last Pain:  Vitals:   01/29/21 1000  TempSrc: Oral  PainSc: 0-No pain      Patients Stated Pain Goal: 3 (44/58/48 3507)  Complications: No notable events documented.

## 2021-01-29 NOTE — Discharge Instructions (Addendum)
[facility]: POST OP INSTRUCTIONS Always review your discharge instruction sheet given to you by the facility where your surgery was performed. IF YOU HAVE DISABILITY OR FAMILY LEAVE FORMS, YOU MUST BRING THEM TO THE OFFICE FOR PROCESSING.   DO NOT GIVE THEM TO YOUR DOCTOR.  PAIN CONTROL  First take acetaminophen (Tylenol) AND/or ibuprofen (Advil) to control your pain after surgery.  Follow directions on package.  Taking acetaminophen (Tylenol) and/or ibuprofen (Advil) regularly after surgery will help to control your pain and lower the amount of prescription pain medication you may need.  You should not take more than 3,000 mg (3 grams) of acetaminophen (Tylenol) in 24 hours.  You should not take ibuprofen (Advil), aleve, motrin, naprosyn or other NSAIDS if you have a history of stomach ulcers or chronic kidney disease.  A prescription for pain medication may be given to you upon discharge.  Take your pain medication as prescribed, if you still have uncontrolled pain after taking acetaminophen (Tylenol) or ibuprofen (Advil). Use ice packs to help control pain. If you need a refill on your pain medication, please contact your pharmacy.  They will contact our office to request authorization. Prescriptions will not be filled after 5pm or on week-ends.  HOME MEDICATIONS Take your usually prescribed medications unless otherwise directed.  DIET You should follow a light diet the first few days after arrival home.  Be sure to include lots of fluids daily. Avoid fatty, fried foods.   CONSTIPATION It is common to experience some constipation after surgery and if you are taking pain medication.  Increasing fluid intake and taking a stool softener (such as Colace) will usually help or prevent this problem from occurring.  A mild laxative (Milk of Magnesia or Miralax) should be taken according to package instructions if there are no bowel movements after 48  hours.  WOUND/INCISION CARE Most patients will experience some swelling and bruising in the area of the incisions.  Ice packs will help.  Swelling and bruising can take several days to resolve.  Unless discharge instructions indicate otherwise, follow guidelines below  STERI-STRIPS - you may remove your outer bandages 48 hours after surgery, and you may shower at that time.  You have steri-strips (small skin tapes) in place directly over the incision.  These strips should be left on the skin for 7-10 days.   DERMABOND/SKIN GLUE - you may shower in 24 hours.  The glue will flake off over the next 2-3 weeks. Any sutures or staples will be removed at the office during your follow-up visit.  ACTIVITIES You may resume regular (light) daily activities beginning the next day--such as daily self-care, walking, climbing stairs--gradually increasing activities as tolerated.  You may have sexual intercourse when it is comfortable.  Refrain from any heavy lifting or straining until approved by your doctor. You may drive when you are no longer taking prescription pain medication, you can comfortably wear a seatbelt, and you can safely maneuver your car and apply brakes.  FOLLOW-UP You should see your doctor in the office for a follow-up appointment approximately 2-3 weeks after your surgery.  You should have been given your post-op/follow-up appointment when your surgery was scheduled.  If you did not receive a post-op/follow-up appointment, make sure that you call for this appointment within a day or two after you arrive home to insure a convenient appointment time.  OTHER INSTRUCTIONS   WHEN TO CALL YOUR DOCTOR: Fever over 101.0 Inability to urinate Continued  bleeding from incision. Increased pain, redness, or drainage from the incision. Increasing abdominal pain  The clinic staff is available to answer your questions during regular business hours.  Please don't hesitate to call and ask to speak to  one of the nurses for clinical concerns.  If you have a medical emergency, go to the nearest emergency room or call 911.  A surgeon from [facility] is always on call at the hospital. [address] [place], [place], [place]  [address] ? [address], [place], [place]   [address] [phone] ? [phone] ? FAX [phone] Web site: [email or web address]

## 2021-01-29 NOTE — Anesthesia Procedure Notes (Addendum)
Procedure Name: Intubation Date/Time: 01/29/2021 12:52 PM Performed by: West Pugh, CRNA Pre-anesthesia Checklist: Patient identified, Emergency Drugs available, Suction available, Patient being monitored and Timeout performed Patient Re-evaluated:Patient Re-evaluated prior to induction Oxygen Delivery Method: Circle system utilized Preoxygenation: Pre-oxygenation with 100% oxygen Induction Type: IV induction Ventilation: Mask ventilation without difficulty Laryngoscope Size: Mac and 4 Grade View: Grade I Tube type: Oral Tube size: 7.5 mm Number of attempts: 1 Airway Equipment and Method: Stylet Placement Confirmation: ETT inserted through vocal cords under direct vision, positive ETCO2, CO2 detector and breath sounds checked- equal and bilateral Secured at: 23 cm Tube secured with: Tape Dental Injury: Teeth and Oropharynx as per pre-operative assessment

## 2021-01-29 NOTE — Interval H&P Note (Signed)
History and Physical Interval Note:  01/29/2021 11:34 AM  Wayne Avila  has presented today for surgery, with the diagnosis of cholecystitis.  The various methods of treatment have been discussed with the patient and family. After consideration of risks, benefits and other options for treatment, the patient has consented to  Procedure(s): LAPAROSCOPIC CHOLECYSTECTOMY WITH INTRAOPERATIVE CHOLANGIOGRAM AND ICG GYE , POSSIBLE OPEN (N/A) LYSIS OF ADHESION (N/A) as a surgical intervention.  The patient's history has been reviewed, patient examined, no change in status, stable for surgery.  I have reviewed the patient's chart and labs.  Questions were answered to the patient's satisfaction.     [name]

## 2021-01-29 NOTE — Anesthesia Preprocedure Evaluation (Addendum)
Anesthesia Evaluation  Patient identified by MRN, date of birth, ID band Patient awake    Reviewed: Allergy & Precautions, NPO status , Patient's Chart, lab work & pertinent test results  History of Anesthesia Complications (+) PONV and history of anesthetic complications  Airway Mallampati: I  TM Distance: >3 FB Neck ROM: Full    Dental  (+) Dental Advisory Given   Pulmonary asthma , Patient abstained from smoking., former smoker,    Pulmonary exam normal        Cardiovascular hypertension, Pt. on medications and Pt. on home beta blockers Normal cardiovascular exam     Neuro/Psych  Headaches, Seizures -, Well Controlled,  PSYCHIATRIC DISORDERS Anxiety Depression Bipolar Disorder    GI/Hepatic PUD, GERD  Medicated and Controlled,(+) Hepatitis -, Autoimmune UC    Endo/Other  Morbid obesity Pre-DM   Renal/GU negative Renal ROS     Musculoskeletal negative musculoskeletal ROS (+)   Abdominal   Peds  (+) ADHD Hematology negative hematology ROS (+)   Anesthesia Other Findings   Reproductive/Obstetrics                            Anesthesia Physical Anesthesia Plan  ASA: 3  Anesthesia Plan: General   Post-op Pain Management:    Induction: Intravenous  PONV Risk Score and Plan: 4 or greater and Treatment may vary due to age or medical condition, Ondansetron, Midazolam, Dexamethasone and Diphenhydramine  Airway Management Planned: Oral ETT  Additional Equipment: None  Intra-op Plan:   Post-operative Plan: Extubation in OR  Informed Consent: I have reviewed the patients History and Physical, chart, labs and discussed the procedure including the risks, benefits and alternatives for the proposed anesthesia with the patient or authorized representative who has indicated his/her understanding and acceptance.     Dental advisory given  Plan Discussed with: CRNA and  Anesthesiologist  Anesthesia Plan Comments:        Anesthesia Quick Evaluation

## 2021-01-29 NOTE — Op Note (Signed)
Wayne Avila [id number] 1990/08/07 01/29/2021  Laparoscopic Cholecystectomy with ICG immunofluorescence procedure Note  Indications: This patient presents with symptomatic gallbladder disease and will undergo laparoscopic cholecystectomy.  Patient has a history of ulcerative colitis and underwent a total abdominal colectomy with loop ileostomy at [facility] a few years ago.  His loop ileostomy was located in his right upper quadrant near the subcostal margin.  A few months later his loop ileostomy was taken down and a J-pouch was created.  Unfortunately after the procedure he had a bowel perforation.  It was managed with 2 percutaneous perihepatic drains.  He had an episode of symptomatic cholelithiasis and possible mild cholecystitis earlier this summer.  He sat in an emergency room for 48 hours while that ER facility attempted to find a general surgeon to care for him.  He ended up being airlifted to [facility] and by the time he presented his cholecystitis and his pain had resolved.  He was referred to my clinic for evaluation because of his episode of cholecystitis.  I had a long conversation with the patient and the mother regarding operative management.  We discussed that he was at higher risk for injury of surrounding structures due to his prior abdominal surgery and the location of where his perforation and prior ostomy were located.  I felt it was best though to proceed with interval cholecystectomy because of the high risk of recurrent cholecystitis even though he may have a hostile abdomen.  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 The  risks, benefits, complications, treatment options, and expected outcomes were discussed with the patient. The possibilities of reaction to medication, pulmonary aspiration, perforation of viscus, bleeding, recurrent infection, finding a normal gallbladder, the need for additional procedures, failure to diagnose a condition, the possible need to convert to an open procedure, and creating a complication requiring transfusion or operation were discussed with the patient. The likelihood of improving the patient's symptoms with return to their baseline status is good.  The patient and/or family concurred with the proposed plan, giving informed consent. The site of surgery properly noted. The patient was taken to Operating Room, identified as Wayne Avila and the procedure verified as Laparoscopic Cholecystectomy with poosible Intraoperative Cholangiogram. A Time Out was held and the above information confirmed. Antibiotic prophylaxis was administered.   Prior to the induction of general anesthesia, antibiotic prophylaxis was administered. General endotracheal anesthesia was then administered and tolerated well. After the induction, the abdomen was prepped with Chloraprep and draped in the sterile fashion. The patient was positioned in the supine position.   Patient had a prior midline incision which should heal by secondary intention along with the right subcostal old ostomy site.  Because of reviewing old operative notes I decided to opt to be the patient in the left upper quadrant.  A small incision was made at Palmer's point.  Then using a 0 degree 5 mm laparoscope through a 5 Miller trocar I advanced it through all layers of the abdominal wall and carefully entered the abdominal cavity.  Pneumoperitoneum was smoothly established up to a patient pressure of 15 mmHg without any change in patient vital signs.  The laparoscope was advanced and the abdominal cavity was  surveilled.  There is no evidence of injury to  surrounding viscera.  There is loops of small bowel densely adhered in the medial right upper quadrant.  His midline however was completely free of adhesions.  A 5 mm trocar was placed in the infraumbilical position.  The laparoscope was placed in that position and I surveilled the left upper quadrant.  Again there is no evidence of injury to surrounding viscera.  There was some small bowel adhesions to the left of the upper midline.  Again there were adhesions of small bowel densely adhered to the medial portion of the right upper quadrant.  I was able to advance the laparoscope around that and visualize the liver and the gallbladder.  There was free abdominal wall space in the right lateral abdominal wall.  2 5 mm trochars were placed in the right lateral abdominal wall.  The liver was essentially fused into position but fortunately the gallbladder was very readily exposed.  There was some thin omentum over it.  This was taken down with EndoShears with electrocautery.  There was a thin veil of small bowel adhesion to the gallbladder but was easily taken down with EndoShears with electrocautery.  The fundus of the gallbladder was grasped and retracted upward slightly but again the liver was essentially fixated in position.  The peritoneum overlying the fundus was gently swept down with the suction irrigator catheter.  My assistant joined me in the operating room and help retract the gallbladder.  The infundibulum was grasped and retracted laterally.  ICG immunofluorescence had been injected prior to surgery.  The peritoneum over the triangle of Lorin Glass was dissected out and identified the cystic duct and the cystic artery.  ICG immunofluorescence was used as a double confirmation of the location of the confluence of the cystic duct and infundibulum.  There is no filling defect in the cystic duct.  I did not dissect more proximally in the biliary structures.  A large window was made around the cystic duct as well as  the cystic artery.  A large critical view was obtained.  2 clips were placed on the downside of the cystic duct and 1 just at the infundibulum.  The gallbladder was then transected above the 2 clips.  The clip that was on the specimen side fell off and there was some spillage of bile but no gallstones spillage.  A Vicryl Endoloop was placed around the infundibulum of the gallbladder to prevent ongoing spillage of bile.  2 clips were placed on the downside of the cystic artery and then transected as it entered the gallbladder with hook electrocautery.  We then mobilized the gallbladder up out of the gallbladder fossa with hook electrocautery.  The gallbladder fossa was irrigated and hemostasis was achieved.  We enlarged one of the right lateral abdominal wall trochars to a 12 mm trocar.  A specimen retrieval bag was placed in the abdominal cavity and the gallbladder was placed within the specimen bag.  Before extraction I visualize the gallbladder fossa again.  There is no evidence of bleeding or bile leak.  Clips were revisualized on the cystic duct stump and cystic artery.  There is no evidence of bleeding or bile leak.  The specimen bag containing the gallbladder was extracted.  The assistant placed a PMI through the 12 mm trocar fascial defect with a 0 Vicryl and closed the fascial defect.  There is no air leak.  Additional local was infiltrated in this location.  I reinspected the left  upper quadrant.  Again no evidence of injury to surrounding viscera.  Pneumoperitoneum was released and the remaining trochars were removed.  Skin incisions were closed with a 4 Monocryl in a subcuticular fashion followed by the application of Dermabond.  All needle, instrument, and sponge counts were correct x2.  There were no immediate complications.  Patient tolerated the procedure well    Findings: Chronic Cholecystitis with Cholelithiasis Loops of small bowel adhered to abdominal wall in medial RUQ  Estimated Blood  Loss: Minimal         Drains: none         Specimens: Gallbladder           Complications: None; patient tolerated the procedure well.         Disposition: PACU - hemodynamically stable.         Condition: stable  Leighton Ruff. Redmond Pulling, MD, FACS General, Bariatric, & Minimally Invasive Surgery [facility], [place]

## 2021-01-30 ENCOUNTER — Encounter (HOSPITAL_COMMUNITY): Payer: Self-pay | Admitting: General Surgery

## 2021-01-30 LAB — SURGICAL PATHOLOGY

## 2021-02-08 DIAGNOSIS — R197 Diarrhea, unspecified: Secondary | ICD-10-CM | POA: Insufficient documentation

## 2021-04-15 ENCOUNTER — Other Ambulatory Visit: Payer: Self-pay

## 2021-04-15 ENCOUNTER — Emergency Department (HOSPITAL_COMMUNITY)
Admission: EM | Admit: 2021-04-15 | Discharge: 2021-04-16 | Disposition: A | Discharge status: Home / Self Care | Payer: Medicaid Other | Attending: Emergency Medicine | Admitting: Emergency Medicine

## 2021-04-15 ENCOUNTER — Encounter (HOSPITAL_COMMUNITY): Payer: Self-pay

## 2021-04-15 DIAGNOSIS — Z87891 Personal history of nicotine dependence: Secondary | ICD-10-CM | POA: Diagnosis not present

## 2021-04-15 DIAGNOSIS — Z79899 Other long term (current) drug therapy: Secondary | ICD-10-CM | POA: Diagnosis not present

## 2021-04-15 DIAGNOSIS — M79672 Pain in left foot: Secondary | ICD-10-CM | POA: Diagnosis present

## 2021-04-15 DIAGNOSIS — I10 Essential (primary) hypertension: Secondary | ICD-10-CM | POA: Diagnosis not present

## 2021-04-15 DIAGNOSIS — M722 Plantar fascial fibromatosis: Secondary | ICD-10-CM | POA: Insufficient documentation

## 2021-04-15 DIAGNOSIS — J4520 Mild intermittent asthma, uncomplicated: Secondary | ICD-10-CM | POA: Insufficient documentation

## 2021-04-15 LAB — CBG MONITORING, ED: Glucose-Capillary: 113 mg/dL — ABNORMAL HIGH (ref 70–99)

## 2021-04-15 NOTE — ED Triage Notes (Addendum)
Pt c/o bilateral foot pain that started this morning, denies injury.   Pt ambulated to room.

## 2021-04-15 NOTE — Progress Notes (Deleted)
NEUROLOGY FOLLOW UP OFFICE NOTE  Wayne Avila [id number]  Assessment/Plan:   1.  Migraine without aura, without status migrainosus, not intractable 2.  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  Bipolar 1 disorder/mental developmental delay   1.  As migraines are infrequent, defer preventative medication 2.  For rescue:  Sumatriptan 124m with promethazine 252m  Stop Fioricet 3.  Limit use of pain relievers to no more than 2 days out of week to prevent risk of rebound or medication-overuse headache. 4.  Keep headache diary 5. Follow up 6 months.  Subjective:  Wayne Avila a [age]ear old male with ADHD, Bipolar 1 disorder, ulcerative colitis and remote history of seizures (not on medication) who follows up for migraine.  UPDATE: Intensity:  *** Duration:  *** Frequency:  *** Frequency of abortive medication: *** Current NSAIDS/analgesics:  Tylenol Current triptans:  sumatriptan 10038murrent ergotamine:  none Current anti-emetic:  none Current muscle relaxants:  none Current Antihypertensive medications:  Propranolol 22m69mD Current Antidepressant medications:  Trazodone  Current Anticonvulsant medications:  none Current anti-CGRP:  none Current Vitamins/Herbal/Supplements:  D Current Antihistamines/Decongestants:  Flonase, Claritin Other therapy:  none Hormone/birth control:  none  Caffeine:  No coffee.  No soda Alcohol:  1 drink every 2 weeks Smoker:  1 cigarette a day Diet:  1 to 2 glasses of water.  No soda.  Does not skip meals Exercise:  no Depression:  no; Anxiety:  no Other pain:  no Sleep:  Sleeps well  HISTORY:  Onset:  2-3 years ago Location:  Predominantly frontal and temporal bilaterally Quality:  pounding Initial Intensity:  severe Aura:  no Prodrome:  no Associated symptoms:  Nausea, vomiting, photophobia, phonophobia, elevated blood pressure.  He denies associated unilateral numbness or weakness. Initial Duration:  1 to 2 days.  With  Fioricet, 15-20 days Initial Frequency:  1 to 2 times a month Initial Frequency of abortive medication: 2 times a month Triggers:  unsure Relieving factors:  Nothing.   Activity:  Movement aggravates migraine   CT head on 08/27/2017 to evaluate headache was normal.  He had a seizure in 2015.     Remote history of seizures.  First seizure occurred in February 2014, which occurred in the middle of an argument.  Semiology staring episode, loss of consciousness and convulsions.  MRI of brain with and without contrast on 04/03/2014 personally reviewed was normal.  EEGs were normal.  He was [facility] but did not have an event during stay.  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  He was previously on Depakote.  He hasn't had a recurrent spell in years.    Past NSAIDS/analgesics: Fioricet, Ibuprofen, tramadol, maybe an opioid in the ED Past abortive triptans:  none Past abortive ergotamine:  none Past muscle relaxants:  Flexeril Past anti-emetic:  Zofran, Reglan, Phenergan Past antihypertensive medications:  clonidine Past antidepressant medications:  sertraline Past anticonvulsant medications:  Depakote Past anti-CGRP:  none Past vitamins/Herbal/Supplements:  none Past antihistamines/decongestants:  Benadryl, Sudafed Other past therapies:  none    Family history of headache: Mom, aunt  PAST MEDICAL HISTORY: Past Medical History:  Diagnosis Date   ADHD (attention deficit hyperactivity disorder)    Anemia    Anxiety    Asthma    Autoimmune hepatitis (HCC)[place] Bipolar 1 disorder (HCC)[place] Bipolar 1 disorder, manic, moderate (HCC)[place]/07/2014   C. difficile colitis 03/22/2016  Clostridium difficile colitis    Complication of anesthesia    Depression    Family history of adverse reaction to anesthesia    mother- N/v and headache   GERD (gastroesophageal reflux disease)    Headache    HOH (hard of hearing)    Hypertension     Mental developmental delay    Pneumonia    PONV (postoperative nausea and vomiting)    Pre-diabetes    Seizures (HCC)    Ulcerative colitis ([place])     MEDICATIONS: Current Outpatient Medications on File Prior to Visit  Medication Sig Dispense Refill   albuterol (PROVENTIL) (2.5 MG/3ML) 0.083% nebulizer solution Inhale 3 mLs (2.5 mg total) into the lungs every 6 (six) hours as needed for wheezing or shortness of breath. 60 vial 2   cholecalciferol (VITAMIN D) 1000 units tablet Take 1 tablet (1,000 Units total) by mouth daily. START AFTER VITAMIN D 50,000 U TABLETS ARE COMPLETE (Patient taking differently: Take 1,000 Units by mouth daily.) 30 tablet 11   EPINEPHrine 0.3 mg/0.3 mL IJ SOAJ injection Inject 0.3 mg into the muscle once as needed (for severe allergic reaction). 1 each 1   fluticasone (FLONASE) 50 MCG/ACT nasal spray Place 1 spray into both nostrils daily. (Patient taking differently: Place 1 spray into both nostrils daily as needed for allergies.) 16 g 0   loratadine (CLARITIN) 10 MG tablet Take 1 tablet (10 mg total) by mouth daily. 90 tablet 1   montelukast (SINGULAIR) 10 MG tablet Take 1 tablet (10 mg total) by mouth at bedtime. 90 tablet 1   oxyCODONE (OXY IR/ROXICODONE) 5 MG immediate release tablet Take 1 tablet (5 mg total) by mouth every 6 (six) hours as needed for severe pain. 15 tablet 0   pantoprazole (PROTONIX) 20 MG tablet Take 1 tablet (20 mg total) by mouth daily. 90 tablet 0   promethazine (PHENERGAN) 25 MG tablet Take 1 tablet (25 mg total) by mouth every 6 (six) hours as needed for nausea or vomiting. 30 tablet 5   propranolol (INDERAL) 10 MG tablet Take 1 tablet (10 mg total) by mouth 3 (three) times daily. 90 tablet 5   Respiratory Therapy Supplies (NEBULIZER COMPRESSOR) KIT And supplies 1 each 0   SUMAtriptan (IMITREX) 100 MG tablet Take 1 tablet earliest onset of headache.  May repeat in 2 hours if headache persists or recurs.  Maximum 2 tablets in 24 hours. 10  tablet 5   traZODone (DESYREL) 150 MG tablet Take 1 tablet (150 mg total) by mouth at bedtime. 30 tablet 1   No current facility-administered medications on file prior to visit.    ALLERGIES: Allergies  Allergen Reactions   Nsaids Other (See Comments)    Should not take due to GI condition   Other Other (See Comments)    Should not take 2/2 GI condition Other reaction(s): Other (See Comments) Should not take due to GI condition   Pineapple Swelling and Other (See Comments)    Reaction:  Lip swelling   Remicade [Infliximab] Other (See Comments)    AUTOIMMUNE HEPATITIS   Strawberry Extract Swelling    Reaction:  Lip swelling   Amoxicillin-Pot Clavulanate Nausea And Vomiting and Other (See Comments)    GI intolerance    Omeprazole Nausea And Vomiting   Tomato Rash    FAMILY HISTORY: Family History  Problem Relation Age of Onset   Asthma Mother    Ulcers Mother    Bipolar disorder Mother    ADD / ADHD  Father    Ulcerative colitis Maternal Aunt    Cancer Maternal Aunt        Leukemia   Ulcerative colitis Paternal Uncle    Colon polyps Maternal Grandmother    Diabetes Maternal Grandmother    Hypertension Maternal Grandmother    Sleep apnea Maternal Grandmother    Heart disease Maternal Grandmother    Diabetes Maternal Grandfather    Hypertension Maternal Grandfather    Sleep apnea Maternal Grandfather    Heart disease Maternal Grandfather    Cancer Maternal Grandfather        prostate   Colon cancer Neg Hx    Liver disease Neg Hx       Objective:  *** General: No acute distress.  Patient appears ***-groomed.   Head:  Normocephalic/atraumatic Eyes:  Fundi examined but not visualized Neck: supple, no paraspinal tenderness, full range of motion Heart:  Regular rate and rhythm Lungs:  Clear to auscultation bilaterally Back: No paraspinal tenderness Neurological Exam: alert and oriented to person, place, and time.  Speech fluent and not dysarthric, language  intact.  CN II-XII intact. Bulk and tone normal, muscle strength 5/5 throughout.  Sensation to light touch intact.  Deep tendon reflexes 2+ throughout, toes downgoing.  Finger to nose testing intact.  Gait normal, Romberg negative.   Metta Clines, DO  CC: ***

## 2021-04-15 NOTE — ED Provider Notes (Signed)
[facility] EMERGENCY DEPARTMENT Provider Note   CSN: [id number] Arrival date & time: 04/15/21  2332     History Chief Complaint  Patient presents with   Foot Pain    Wayne Avila is a 30 y.o. male.  Patient presents to the emergency department for evaluation of bilateral foot pain.  Patient reports that he noticed the pain when he woke up.  Pain is worsened through the day.  Patient complaining of pain in the bottoms of both of his feet that worsen when he tries to walk.  He denies any injury.      Past Medical History:  Diagnosis Date   ADHD (attention deficit hyperactivity disorder)    Anemia    Anxiety    Asthma    Autoimmune hepatitis ([place])    Bipolar 1 disorder (HCC)    Bipolar 1 disorder, manic, moderate ([place]) 11/09/2014   C. difficile colitis 03/22/2016   Clostridium difficile colitis    Complication of anesthesia    Depression    Family history of adverse reaction to anesthesia    mother- N/v and headache   GERD (gastroesophageal reflux disease)    Headache    HOH (hard of hearing)    Hypertension    Mental developmental delay    Pneumonia    PONV (postoperative nausea and vomiting)    Pre-diabetes    Seizures ([place])    Ulcerative colitis ([place])     Patient Active Problem List   Diagnosis Date Noted   Gallbladder disease 11/30/2020   Migraine 04/04/2020   Need for immunization against influenza 04/04/2020   Postoperative intra-abdominal abscess 02/16/2019   Iron deficiency anemia 03/26/2018   Bipolar 1 disorder ([place]) 01/20/2017   History of seizure 01/20/2017   ADHD 01/09/2017   Environmental allergies 01/09/2017   Class 3 severe obesity due to excess calories with serious comorbidity and body mass index (BMI) of 40.0 to 44.9 in adult ([place]) 01/09/2017   Autoimmune hepatitis ([place]) 01/02/2017   Asthma in adult, mild intermittent, uncomplicated 34/19/6222   Normocytic anemia 12/31/2016   Elevated LFTs 12/31/2016   Abnormal thyroid function test  03/24/2016   C. difficile diarrhea 03/22/2016   Ulcerative colitis with complication ([place]) 97/98/9211   Mental developmental delay 11/15/2012    Past Surgical History:  Procedure Laterality Date   ADENOIDECTOMY     BIOPSY N/A 12/07/2012   Procedure: GASTRIC BIOPSIES;  Surgeon: [name], MD;  Location: [facility];  Service: Endoscopy;  Laterality: N/A;   BIOPSY N/A 08/30/2013   Procedure: BIOPSY;  Surgeon: [name], MD;  Location: [facility];  Service: Endoscopy;  Laterality: N/A;  right colon,transverse colon, left colon, rectal biopsies   BIOPSY  11/20/2015   Procedure: BIOPSY;  Surgeon: [name], MD;  [facility];  Service: Endoscopy;;  ileal, right colon biopsy, left colon, rectum   BIOPSY  04/18/2016   Procedure: BIOPSY;  Surgeon: [name], MD;  [facility];  Service: Endoscopy;;  left descending colon biopsies   CHOLECYSTECTOMY N/A 01/29/2021   Procedure: LAPAROSCOPIC CHOLECYSTECTOMY WITH INTRAOPERATIVE CHOLANGIOGRAM AND ICG GYE , POSSIBLE OPEN;  Surgeon: [name], MD;  Location: [facility];  Service: General;  Laterality: N/A;   COLON SURGERY     ileostomy   COLONOSCOPY  11/20/2015   Dr. [name]: Severe erythema, edema, ulcers from the anal verge to 20 cm above the anal verge without mucosal sparing, remaining colon and terminal ileum appeared normal. Biopsies from  the rectum revealed fulminant active chronic colitis consistent with IBD. Pathology from terminal ileum revealed intramucosal lymphoid aggregates. Remaining colon random biopsies with inactive chronic colitis   COLONOSCOPY WITH PROPOFOL N/A 08/30/2013   DEY:CXKGYJ mucosa in the terminal ileum/COLITIS/ MILD PROCTITIS. 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   COLONOSCOPY WITH PROPOFOL N/A 11/20/2015   Dr. [name]: chronic inactive pancolitis and active severe ulcerative  proctitis    COLOPROCTECTOMY W/ ILEO J POUCH     ESOPHAGOGASTRODUODENOSCOPY  11/20/2015   Dr. [name]: Normal exam, stomach biopsied and duodenal biopsy.. Benign biopsies.   ESOPHAGOGASTRODUODENOSCOPY (EGD) WITH PROPOFOL N/A 12/07/2012   SLF:The mucosa of the esophagus appeared normal Non-erosive gastritis (inflammation) was found in the gastric antrum; multiple biopsies The duodenal mucosa showed no abnormalities in the bulb and second portion of the duodenum   ESOPHAGOGASTRODUODENOSCOPY (EGD) WITH PROPOFOL N/A 11/20/2015   Dr. [name]: normal with normal biopsies, negative H.pylori    ESOPHAGOGASTRODUODENOSCOPY (EGD) WITH PROPOFOL N/A 04/18/2016   Procedure: ESOPHAGOGASTRODUODENOSCOPY (EGD) WITH PROPOFOL;  Surgeon: [name], MD;  [facility];  Service: Endoscopy;  Laterality: N/A;   FLEXIBLE SIGMOIDOSCOPY N/A 04/18/2016   Procedure: FLEXIBLE SIGMOIDOSCOPY;  Surgeon: [name], MD;  [facility];  Service: Endoscopy;  Laterality: N/A;   LYSIS OF ADHESION N/A 01/29/2021   Procedure: LYSIS OF ADHESION;  Surgeon: [name], MD;  [facility];  Service: General;  Laterality: N/A;   TONSILLECTOMY         Family History  Problem Relation Age of Onset   Asthma Mother    Ulcers Mother    Bipolar disorder Mother    ADD / ADHD Father    Ulcerative colitis Maternal Aunt    Cancer Maternal Aunt        Leukemia   Ulcerative colitis Paternal Uncle    Colon polyps Maternal Grandmother    Diabetes Maternal Grandmother    Hypertension Maternal Grandmother    Sleep apnea Maternal Grandmother    Heart disease Maternal Grandmother    Diabetes Maternal Grandfather    Hypertension Maternal Grandfather    Sleep apnea Maternal Grandfather    Heart disease Maternal Grandfather    Cancer Maternal Grandfather        prostate   Colon cancer Neg Hx    Liver disease Neg Hx     Social History   Tobacco Use   Smoking status: Former    Packs/day: 0.00    Years:  2.00    Pack years: 0.00    Types: Cigarettes    Quit date: 10/06/2012    Years since quitting: 8.5   Smokeless tobacco: Never   Tobacco comments:    Never smoked cigarettes  Vaping Use   Vaping Use: Never used  Substance Use Topics   Alcohol use: No    Alcohol/week: 0.0 standard drinks    Comment: denies usage   Drug use: Not Currently    Types: Marijuana    Home Medications Prior to Admission medications   Medication Sig Start Date End Date Taking? Authorizing Provider  albuterol (PROVENTIL) (2.5 MG/3ML) 0.083% nebulizer solution Inhale 3 mLs (2.5 mg total) into the lungs every 6 (six) hours as needed for wheezing or shortness of breath. 04/29/17   Raylene Everts, MD  cholecalciferol (VITAMIN D) 1000 units tablet Take 1 tablet (1,000 Units total) by mouth daily. START AFTER VITAMIN D 50,000  U TABLETS ARE COMPLETE Patient taking differently: Take 1,000 Units by mouth daily. 04/08/16   Fields, Marga Melnick, MD  EPINEPHrine 0.3 mg/0.3 mL IJ SOAJ injection Inject 0.3 mg into the muscle once as needed (for severe allergic reaction). 10/04/20   Noreene Larsson, NP  fluticasone (FLONASE) 50 MCG/ACT nasal spray Place 1 spray into both nostrils daily. Patient taking differently: Place 1 spray into both nostrils daily as needed for allergies. 02/11/18   Rancour, Annie Main, MD  loratadine (CLARITIN) 10 MG tablet Take 1 tablet (10 mg total) by mouth daily. 10/04/20   Noreene Larsson, NP  montelukast (SINGULAIR) 10 MG tablet Take 1 tablet (10 mg total) by mouth at bedtime. 10/04/20   Noreene Larsson, NP  oxyCODONE (OXY IR/ROXICODONE) 5 MG immediate release tablet Take 1 tablet (5 mg total) by mouth every 6 (six) hours as needed for severe pain. 01/29/21   [name], MD  pantoprazole (PROTONIX) 20 MG tablet Take 1 tablet (20 mg total) by mouth daily. 10/04/20   Noreene Larsson, NP  promethazine (PHENERGAN) 25 MG tablet Take 1 tablet (25 mg total) by mouth every 6 (six) hours as needed for nausea or vomiting.  10/11/20   Pieter Partridge, DO  propranolol (INDERAL) 10 MG tablet Take 1 tablet (10 mg total) by mouth 3 (three) times daily. 01/29/21   Noreene Larsson, NP  Respiratory Therapy Supplies (NEBULIZER COMPRESSOR) KIT And supplies 04/29/17   Raylene Everts, MD  SUMAtriptan (IMITREX) 100 MG tablet Take 1 tablet earliest onset of headache.  May repeat in 2 hours if headache persists or recurs.  Maximum 2 tablets in 24 hours. 10/11/20   Pieter Partridge, DO  traZODone (DESYREL) 150 MG tablet Take 1 tablet (150 mg total) by mouth at bedtime. 01/29/21   Noreene Larsson, NP    Allergies    Nsaids, Other, Pineapple, Remicade [infliximab], Strawberry extract, Amoxicillin-pot clavulanate, Omeprazole, and Tomato  Review of Systems   Review of Systems  Musculoskeletal:  Positive for arthralgias.  Skin: Negative.   Neurological:  Negative for numbness.   Physical Exam Updated Vital Signs BP 118/90   Pulse 76   Temp 98.3 F (36.8 C)   Resp 19   Ht _0  (1.727 m)   Wt 120 kg   SpO2 99%   BMI 40.22 kg/m   Physical Exam Vitals and nursing note reviewed.  Constitutional:      General: He is not in acute distress.    Appearance: Normal appearance. He is well-developed.  HENT:     Head: Normocephalic and atraumatic.     Right Ear: Hearing normal.     Left Ear: Hearing normal.     Nose: Nose normal.  Eyes:     Conjunctiva/sclera: Conjunctivae normal.     Pupils: Pupils are equal, round, and reactive to light.  Cardiovascular:     Rate and Rhythm: Regular rhythm.     Pulses:          Dorsalis pedis pulses are 2+ on the right side and 2+ on the left side.     Heart sounds: S1 normal and S2 normal. No murmur heard.   No friction rub. No gallop.  Pulmonary:     Effort: Pulmonary effort is normal. No respiratory distress.     Breath sounds: Normal breath sounds.  Chest:     Chest wall: No tenderness.  Abdominal:     General: Bowel sounds are normal.  Palpations: Abdomen is soft.      Tenderness: There is no abdominal tenderness. There is no guarding or rebound. Negative signs include Murphy's sign and McBurney's sign.     Hernia: No hernia is present.  Musculoskeletal:        General: Normal range of motion.     Cervical back: Normal range of motion and neck supple.     Right foot: Tenderness (plantar) present. No swelling or deformity. Normal pulse.     Left foot: Tenderness (plantar) present. No swelling or deformity. Normal pulse.  Skin:    General: Skin is warm and dry.     Findings: No rash.  Neurological:     Mental Status: He is alert and oriented to person, place, and time.     GCS: GCS eye subscore is 4. GCS verbal subscore is 5. GCS motor subscore is 6.     Cranial Nerves: No cranial nerve deficit.     Sensory: No sensory deficit.     Coordination: Coordination normal.  Psychiatric:        Speech: Speech normal.        Behavior: Behavior normal.        Thought Content: Thought content normal.    ED Results / Procedures / Treatments   Labs (all labs ordered are listed, but only abnormal results are displayed) Labs Reviewed  CBG MONITORING, ED - Abnormal; Notable for the following components:      Result Value   Glucose-Capillary 113 (*)    All other components within normal limits    EKG None  Radiology DG Foot Complete Left  Result Date: 04/16/2021 CLINICAL DATA:  Left foot pain. EXAM: LEFT FOOT - COMPLETE 3+ VIEW COMPARISON:  None. FINDINGS: There is no evidence of fracture or dislocation. There is no evidence of arthropathy or other focal bone abnormality. Soft tissues are unremarkable. IMPRESSION: Negative. Electronically Signed   By: Anner Crete M.D.   On: 04/16/2021 00:31   DG Foot Complete Right  Result Date: 04/16/2021 CLINICAL DATA:  Bilateral foot pain EXAM: RIGHT FOOT COMPLETE - 3+ VIEW COMPARISON:  None. FINDINGS: No fracture or dislocation is seen. The joint spaces are preserved. The visualized soft tissues are unremarkable.  IMPRESSION: Negative. Electronically Signed   By: Julian Hy M.D.   On: 04/16/2021 00:31    Procedures Procedures   Medications Ordered in ED Medications  acetaminophen (TYLENOL) tablet 1,000 mg (has no administration in time range)  dexamethasone (DECADRON) injection 10 mg (has no administration in time range)    ED Course  I have reviewed the triage vital signs and the nursing notes.  Pertinent labs & imaging results that were available during my care of the patient were reviewed by me and considered in my medical decision making (see chart for details).    MDM Rules/Calculators/A&P                           Patient presents to the emergency department for evaluation of bilateral foot pain.  Patient denies trauma.  Patient experiencing pain on both plantar aspects of his feet.  He has normal capillary refill, normal pulses.  Sensation is normal.  There is no swelling or deformity.  No overlying skin changes that would suggest infection.  X-rays do not show any heel spurs or other abnormality.  Final Clinical Impression(s) / ED Diagnoses Final diagnoses:  Plantar fasciitis, bilateral    Rx / DC Orders ED  Discharge Orders     None        Orpah Greek, MD 04/16/21 [phone]

## 2021-04-16 ENCOUNTER — Ambulatory Visit: Payer: Medicaid Other | Admitting: Neurology

## 2021-04-16 ENCOUNTER — Emergency Department (HOSPITAL_COMMUNITY): Payer: Medicaid Other

## 2021-04-16 MED ORDER — ACETAMINOPHEN 500 MG PO TABS
1000.0000 mg | ORAL_TABLET | Freq: Once | ORAL | Status: AC
  Administered 2021-04-16: 1000 mg via ORAL
  Filled 2021-04-16: qty 2

## 2021-04-16 MED ORDER — DEXAMETHASONE SODIUM PHOSPHATE 10 MG/ML IJ SOLN
10.0000 mg | Freq: Once | INTRAMUSCULAR | Status: AC
  Administered 2021-04-16: 10 mg via INTRAMUSCULAR
  Filled 2021-04-16: qty 1

## 2021-04-25 ENCOUNTER — Encounter (HOSPITAL_COMMUNITY): Payer: Self-pay

## 2021-04-25 ENCOUNTER — Other Ambulatory Visit: Payer: Self-pay

## 2021-04-25 DIAGNOSIS — Z20822 Contact with and (suspected) exposure to covid-19: Secondary | ICD-10-CM | POA: Insufficient documentation

## 2021-04-25 DIAGNOSIS — R059 Cough, unspecified: Secondary | ICD-10-CM | POA: Diagnosis not present

## 2021-04-25 DIAGNOSIS — J4520 Mild intermittent asthma, uncomplicated: Secondary | ICD-10-CM | POA: Diagnosis not present

## 2021-04-25 DIAGNOSIS — Z79899 Other long term (current) drug therapy: Secondary | ICD-10-CM | POA: Diagnosis not present

## 2021-04-25 DIAGNOSIS — R0981 Nasal congestion: Secondary | ICD-10-CM | POA: Insufficient documentation

## 2021-04-25 DIAGNOSIS — I10 Essential (primary) hypertension: Secondary | ICD-10-CM | POA: Diagnosis not present

## 2021-04-25 DIAGNOSIS — Z87891 Personal history of nicotine dependence: Secondary | ICD-10-CM | POA: Insufficient documentation

## 2021-04-25 NOTE — ED Triage Notes (Signed)
Pt reports cough, congestion, and runny nose that started yesterday. Reports has been exposed to RSV

## 2021-04-26 ENCOUNTER — Emergency Department (HOSPITAL_COMMUNITY)
Admission: EM | Admit: 2021-04-26 | Discharge: 2021-04-26 | Disposition: A | Discharge status: Home / Self Care | Payer: Medicaid Other | Attending: Emergency Medicine | Admitting: Emergency Medicine

## 2021-04-26 DIAGNOSIS — R0981 Nasal congestion: Secondary | ICD-10-CM

## 2021-04-26 LAB — RESP PANEL BY RT-PCR (FLU A&B, COVID) ARPGX2
Influenza A by PCR: NEGATIVE
Influenza B by PCR: NEGATIVE
SARS Coronavirus 2 by RT PCR: NEGATIVE

## 2021-04-26 NOTE — ED Provider Notes (Signed)
[facility] EMERGENCY DEPARTMENT Provider Note   CSN: [id number] Arrival date & time: 04/25/21  2200     History Chief Complaint  Patient presents with   Nasal Congestion    Wayne Avila is a 30 y.o. male.  The history is provided by the patient.  Cough Severity:  Mild Onset quality:  Gradual Duration:  1 day Timing:  Intermittent Progression:  Unchanged Chronicity:  New Relieved by:  Nothing Worsened by:  Nothing Associated symptoms: sinus congestion   Associated symptoms: no chest pain, no fever and no shortness of breath   Patient with history of asthma and bipolar disorder presents with congestion and mild cough.  This been present for about a day.  No fevers or vomiting.  No chest pain or shortness of breath.  He has had sick contacts.    Past Medical History:  Diagnosis Date   ADHD (attention deficit hyperactivity disorder)    Anemia    Anxiety    Asthma    Autoimmune hepatitis ([place])    Bipolar 1 disorder (HCC)    Bipolar 1 disorder, manic, moderate ([place]) 11/09/2014   C. difficile colitis 03/22/2016   Clostridium difficile colitis    Complication of anesthesia    Depression    Family history of adverse reaction to anesthesia    mother- N/v and headache   GERD (gastroesophageal reflux disease)    Headache    HOH (hard of hearing)    Hypertension    Mental developmental delay    Pneumonia    PONV (postoperative nausea and vomiting)    Pre-diabetes    Seizures ([place])    Ulcerative colitis ([place])     Patient Active Problem List   Diagnosis Date Noted   Gallbladder disease 11/30/2020   Migraine 04/04/2020   Need for immunization against influenza 04/04/2020   Postoperative intra-abdominal abscess 02/16/2019   Iron deficiency anemia 03/26/2018   Bipolar 1 disorder ([place]) 01/20/2017   History of seizure 01/20/2017   ADHD 01/09/2017   Environmental allergies 01/09/2017   Class 3 severe obesity due to excess calories with serious comorbidity and body  mass index (BMI) of 40.0 to 44.9 in adult ([place]) 01/09/2017   Autoimmune hepatitis ([place]) 01/02/2017   Asthma in adult, mild intermittent, uncomplicated 70/34/0352   Normocytic anemia 12/31/2016   Elevated LFTs 12/31/2016   Abnormal thyroid function test 03/24/2016   C. difficile diarrhea 03/22/2016   Ulcerative colitis with complication ([place]) 48/18/5909   Mental developmental delay 11/15/2012    Past Surgical History:  Procedure Laterality Date   ADENOIDECTOMY     BIOPSY N/A 12/07/2012   Procedure: GASTRIC BIOPSIES;  Surgeon: [name], MD;  Location: [facility];  Service: Endoscopy;  Laterality: N/A;   BIOPSY N/A 08/30/2013   Procedure: BIOPSY;  Surgeon: [name], MD;  Location: [facility];  Service: Endoscopy;  Laterality: N/A;  right colon,transverse colon, left colon, rectal biopsies   BIOPSY  11/20/2015   Procedure: BIOPSY;  Surgeon: [name], MD;  [facility];  Service: Endoscopy;;  ileal, right colon biopsy, left colon, rectum   BIOPSY  04/18/2016   Procedure: BIOPSY;  Surgeon: [name], MD;  [facility];  Service: Endoscopy;;  left descending colon biopsies   CHOLECYSTECTOMY N/A 01/29/2021   Procedure: LAPAROSCOPIC CHOLECYSTECTOMY WITH INTRAOPERATIVE CHOLANGIOGRAM AND ICG GYE , POSSIBLE OPEN;  Surgeon: [name], MD;  [facility];  Service: General;  Laterality: N/A;   COLON SURGERY  ileostomy   COLONOSCOPY  11/20/2015   Dr. [name]: Severe erythema, edema, ulcers from the anal verge to 20 cm above the anal verge without mucosal sparing, remaining colon and terminal ileum appeared normal. Biopsies from the rectum revealed fulminant active chronic colitis consistent with IBD. Pathology from terminal ileum revealed intramucosal lymphoid aggregates. Remaining colon random biopsies with inactive chronic colitis   COLONOSCOPY WITH PROPOFOL N/A 08/30/2013   FEO:FHQRFX mucosa in the terminal ileum/COLITIS/ MILD PROCTITIS. Biopsies showed  patchy chronic active colitis of the right colon and rectum, overall findings most consistent with idiopathic inflammatory bowel disease.   COLONOSCOPY WITH PROPOFOL N/A 11/20/2015   Dr. [name]: chronic inactive pancolitis and active severe ulcerative proctitis    COLOPROCTECTOMY W/ ILEO J POUCH     ESOPHAGOGASTRODUODENOSCOPY  11/20/2015   Dr. [name]: Normal exam, stomach biopsied and duodenal biopsy.. Benign biopsies.   ESOPHAGOGASTRODUODENOSCOPY (EGD) WITH PROPOFOL N/A 12/07/2012   SLF:The mucosa of the esophagus appeared normal Non-erosive gastritis (inflammation) was found in the gastric antrum; multiple biopsies The duodenal mucosa showed no abnormalities in the bulb and second portion of the duodenum   ESOPHAGOGASTRODUODENOSCOPY (EGD) WITH PROPOFOL N/A 11/20/2015   Dr. [name]: normal with normal biopsies, negative H.pylori    ESOPHAGOGASTRODUODENOSCOPY (EGD) WITH PROPOFOL N/A 04/18/2016   Procedure: ESOPHAGOGASTRODUODENOSCOPY (EGD) WITH PROPOFOL;  Surgeon: [name], MD;  [facility];  Service: Endoscopy;  Laterality: N/A;   FLEXIBLE SIGMOIDOSCOPY N/A 04/18/2016   Procedure: FLEXIBLE SIGMOIDOSCOPY;  Surgeon: [name], MD;  [facility];  Service: Endoscopy;  Laterality: N/A;   LYSIS OF ADHESION N/A 01/29/2021   Procedure: LYSIS OF ADHESION;  Surgeon: [name], MD;  [facility];  Service: General;  Laterality: N/A;   TONSILLECTOMY         Family History  Problem Relation Age of Onset   Asthma Mother    Ulcers Mother    Bipolar disorder Mother    ADD / ADHD Father    Ulcerative colitis Maternal Aunt    Cancer Maternal Aunt        Leukemia   Ulcerative colitis Paternal Uncle    Colon polyps Maternal Grandmother    Diabetes Maternal Grandmother    Hypertension Maternal Grandmother    Sleep apnea Maternal Grandmother    Heart disease Maternal Grandmother    Diabetes Maternal Grandfather    Hypertension Maternal Grandfather    Sleep  apnea Maternal Grandfather    Heart disease Maternal Grandfather    Cancer Maternal Grandfather        prostate   Colon cancer Neg Hx    Liver disease Neg Hx     Social History   Tobacco Use   Smoking status: Former    Packs/day: 0.00    Years: 2.00    Pack years: 0.00    Types: Cigarettes    Quit date: 10/06/2012    Years since quitting: 8.5   Smokeless tobacco: Never   Tobacco comments:    Never smoked cigarettes  Vaping Use   Vaping Use: Never used  Substance Use Topics   Alcohol use: No    Alcohol/week: 0.0 standard drinks    Comment: denies usage   Drug use: Not Currently    Types: Marijuana    Home Medications Prior to Admission medications   Medication Sig Start Date End Date Taking? Authorizing Provider  albuterol (PROVENTIL) (2.5 MG/3ML) 0.083% nebulizer solution Inhale 3 mLs (2.5 mg total) into the lungs every 6 (six)  hours as needed for wheezing or shortness of breath. 04/29/17   Raylene Everts, MD  cholecalciferol (VITAMIN D) 1000 units tablet Take 1 tablet (1,000 Units total) by mouth daily. START AFTER VITAMIN D 50,000 U TABLETS ARE COMPLETE Patient taking differently: Take 1,000 Units by mouth daily. 04/08/16   Fields, Marga Melnick, MD  EPINEPHrine 0.3 mg/0.3 mL IJ SOAJ injection Inject 0.3 mg into the muscle once as needed (for severe allergic reaction). 10/04/20   Noreene Larsson, NP  fluticasone (FLONASE) 50 MCG/ACT nasal spray Place 1 spray into both nostrils daily. Patient taking differently: Place 1 spray into both nostrils daily as needed for allergies. 02/11/18   Rancour, Annie Main, MD  loratadine (CLARITIN) 10 MG tablet Take 1 tablet (10 mg total) by mouth daily. 10/04/20   Noreene Larsson, NP  montelukast (SINGULAIR) 10 MG tablet Take 1 tablet (10 mg total) by mouth at bedtime. 10/04/20   Noreene Larsson, NP  oxyCODONE (OXY IR/ROXICODONE) 5 MG immediate release tablet Take 1 tablet (5 mg total) by mouth every 6 (six) hours as needed for severe pain. 01/29/21    [name], MD  pantoprazole (PROTONIX) 20 MG tablet Take 1 tablet (20 mg total) by mouth daily. 10/04/20   Noreene Larsson, NP  promethazine (PHENERGAN) 25 MG tablet Take 1 tablet (25 mg total) by mouth every 6 (six) hours as needed for nausea or vomiting. 10/11/20   Pieter Partridge, DO  propranolol (INDERAL) 10 MG tablet Take 1 tablet (10 mg total) by mouth 3 (three) times daily. 01/29/21   Noreene Larsson, NP  Respiratory Therapy Supplies (NEBULIZER COMPRESSOR) KIT And supplies 04/29/17   Raylene Everts, MD  SUMAtriptan (IMITREX) 100 MG tablet Take 1 tablet earliest onset of headache.  May repeat in 2 hours if headache persists or recurs.  Maximum 2 tablets in 24 hours. 10/11/20   Pieter Partridge, DO  traZODone (DESYREL) 150 MG tablet Take 1 tablet (150 mg total) by mouth at bedtime. 01/29/21   Noreene Larsson, NP    Allergies    Nsaids, Other, Pineapple, Remicade [infliximab], Strawberry extract, Amoxicillin-pot clavulanate, Omeprazole, and Tomato  Review of Systems   Review of Systems  Constitutional:  Negative for fever.  Respiratory:  Positive for cough. Negative for shortness of breath.   Cardiovascular:  Negative for chest pain.  Gastrointestinal:  Negative for vomiting.  All other systems reviewed and are negative.  Physical Exam Updated Vital Signs BP (!) 128/95 (BP Location: Right Arm)   Pulse 75   Temp 98.2 F (36.8 C) (Oral)   Resp 18   Ht 1.727 m (_0 )   Wt 114.4 kg   SpO2 99%   BMI 38.35 kg/m   Physical Exam CONSTITUTIONAL: Well developed/well nourished HEAD: Normocephalic/atraumatic EYES: EOMI/PERRL ENMT: Mucous membranes moist, patient sneezing and blowing his nose frequently.  Excessive rhinorrhea is noted NECK: supple no meningeal signs CV: S1/S2 noted, no murmurs/rubs/gallops noted LUNGS: Lungs are clear to auscultation bilaterally, no apparent distress ABDOMEN: soft, nontender NEURO: Pt is awake/alert/appropriate, moves all extremitiesx4.  No facial droop.   Patient walking  around the room in no distress EXTREMITIES:  full ROM SKIN: warm, color normal PSYCH: no abnormalities of mood noted, alert and oriented to situation  ED Results / Procedures / Treatments   Labs (all labs ordered are listed, but only abnormal results are displayed) Labs Reviewed  RESP PANEL BY RT-PCR (FLU A&B, COVID) [id number]    EKG None  Radiology No results found.  Procedures Procedures   Medications Ordered in ED Medications - No data to display  ED Course  I have reviewed the triage vital signs and the nursing notes.      MDM Rules/Calculators/A&P                           Patient well-appearing.  No hypoxia.  Lung sounds clear.  Viral panel sent.  He is safe for discharge Final Clinical Impression(s) / ED Diagnoses Final diagnoses:  Nasal congestion    Rx / DC Orders ED Discharge Orders     None        Ripley Fraise, MD 04/26/21 [phone]

## 2021-04-26 NOTE — ED Notes (Signed)
Pt unable to sign discharge. Pad not working at this time. Pt verbally acknowledges discharge.

## 2021-06-04 ENCOUNTER — Encounter: Payer: Self-pay | Admitting: Nurse Practitioner

## 2021-06-04 ENCOUNTER — Other Ambulatory Visit: Payer: Self-pay

## 2021-06-04 ENCOUNTER — Ambulatory Visit (INDEPENDENT_AMBULATORY_CARE_PROVIDER_SITE_OTHER): Payer: Medicaid Other | Admitting: Nurse Practitioner

## 2021-06-04 VITALS — BP 139/92 | HR 70 | Ht 68.0 in | Wt 250.1 lb

## 2021-06-04 DIAGNOSIS — F319 Bipolar disorder, unspecified: Secondary | ICD-10-CM | POA: Insufficient documentation

## 2021-06-04 DIAGNOSIS — K219 Gastro-esophageal reflux disease without esophagitis: Secondary | ICD-10-CM

## 2021-06-04 DIAGNOSIS — Z9109 Other allergy status, other than to drugs and biological substances: Secondary | ICD-10-CM

## 2021-06-04 DIAGNOSIS — F901 Attention-deficit hyperactivity disorder, predominantly hyperactive type: Secondary | ICD-10-CM

## 2021-06-04 DIAGNOSIS — Z0001 Encounter for general adult medical examination with abnormal findings: Secondary | ICD-10-CM | POA: Diagnosis not present

## 2021-06-04 DIAGNOSIS — R946 Abnormal results of thyroid function studies: Secondary | ICD-10-CM | POA: Diagnosis not present

## 2021-06-04 DIAGNOSIS — D509 Iron deficiency anemia, unspecified: Secondary | ICD-10-CM

## 2021-06-04 DIAGNOSIS — G43809 Other migraine, not intractable, without status migrainosus: Secondary | ICD-10-CM

## 2021-06-04 DIAGNOSIS — K754 Autoimmune hepatitis: Secondary | ICD-10-CM

## 2021-06-04 MED ORDER — PROPRANOLOL HCL 10 MG PO TABS: 10.0000 mg | ORAL_TABLET | Freq: Three times a day (TID) | ORAL | 5 refills | Status: DC

## 2021-06-04 MED ORDER — PANTOPRAZOLE SODIUM 20 MG PO TBEC: 20.0000 mg | DELAYED_RELEASE_TABLET | Freq: Every day | ORAL | 0 refills | Status: DC

## 2021-06-04 MED ORDER — TRAZODONE HCL 150 MG PO TABS: 150.0000 mg | ORAL_TABLET | Freq: Every day | ORAL | 1 refills | Status: DC

## 2021-06-04 MED ORDER — LORATADINE 10 MG PO TABS: 10.0000 mg | ORAL_TABLET | Freq: Every day | ORAL | 1 refills | Status: DC

## 2021-06-04 MED ORDER — SUMATRIPTAN SUCCINATE 100 MG PO TABS
ORAL_TABLET | ORAL | 5 refills | Status: AC
Start: 2021-06-04 — End: ?

## 2021-06-04 MED ORDER — MONTELUKAST SODIUM 10 MG PO TABS: 10.0000 mg | ORAL_TABLET | Freq: Every day | ORAL | 1 refills | Status: DC

## 2021-06-04 NOTE — Progress Notes (Signed)
Acute Office Visit  Subjective:    Patient ID: Wayne Avila, male    DOB: Mar 14, 1991, 30 y.o.   MRN: [id number]  Chief Complaint  Patient presents with   Annual Exam    CPE    HPI Patient is in today for physical exam. No acute concerns.  Past Medical History:  Diagnosis Date   ADHD (attention deficit hyperactivity disorder)    Anemia    Anxiety    Asthma    Autoimmune hepatitis ([place])    Bipolar 1 disorder (HCC)    Bipolar 1 disorder, manic, moderate (HCC) 11/09/2014   C. difficile colitis 03/22/2016   C. difficile diarrhea 03/22/2016   Clostridium difficile colitis    Complication of anesthesia    Depression    Family history of adverse reaction to anesthesia    mother- N/v and headache   GERD (gastroesophageal reflux disease)    Headache    HOH (hard of hearing)    Hypertension    Mental developmental delay    Pneumonia    PONV (postoperative nausea and vomiting)    Pre-diabetes    Seizures ([place])    Ulcerative colitis ([place])     Past Surgical History:  Procedure Laterality Date   ADENOIDECTOMY     BIOPSY N/A 12/07/2012   Procedure: GASTRIC BIOPSIES;  Surgeon: [name], MD;  Location: [facility];  Service: Endoscopy;  Laterality: N/A;   BIOPSY N/A 08/30/2013   Procedure: BIOPSY;  Surgeon: [name], MD;  Location: [facility];  Service: Endoscopy;  Laterality: N/A;  right colon,transverse colon, left colon, rectal biopsies   BIOPSY  11/20/2015   Procedure: BIOPSY;  Surgeon: [name], MD;  [facility];  Service: Endoscopy;;  ileal, right colon biopsy, left colon, rectum   BIOPSY  04/18/2016   Procedure: BIOPSY;  Surgeon: [name], MD;  [facility];  Service: Endoscopy;;  left descending colon biopsies   CHOLECYSTECTOMY N/A 01/29/2021   Procedure: LAPAROSCOPIC CHOLECYSTECTOMY WITH INTRAOPERATIVE CHOLANGIOGRAM AND ICG GYE , POSSIBLE OPEN;  Surgeon: [name], MD;  Location: [facility];  Service: General;  Laterality: N/A;    COLON SURGERY     ileostomy   COLONOSCOPY  11/20/2015   Dr. [name]: Severe erythema, edema, ulcers from the anal verge to 20 cm above the anal verge without mucosal sparing, remaining colon and terminal ileum appeared normal. Biopsies from the rectum revealed fulminant active chronic colitis consistent with IBD. Pathology from terminal ileum revealed intramucosal lymphoid aggregates. Remaining colon random biopsies with inactive chronic colitis   COLONOSCOPY WITH PROPOFOL N/A 08/30/2013   NLZ:JQBHAL mucosa in the terminal ileum/COLITIS/ MILD PROCTITIS. 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   COLONOSCOPY WITH PROPOFOL N/A 11/20/2015   Dr. [name]: chronic inactive pancolitis and active severe ulcerative proctitis    COLOPROCTECTOMY W/ ILEO J POUCH     ESOPHAGOGASTRODUODENOSCOPY  11/20/2015   Dr. [name]: Normal exam, stomach biopsied and duodenal biopsy.. Benign biopsies.   ESOPHAGOGASTRODUODENOSCOPY (EGD) WITH PROPOFOL N/A 12/07/2012   SLF:The mucosa of the esophagus appeared normal Non-erosive gastritis (inflammation) was found in the gastric antrum; multiple biopsies The duodenal mucosa showed no abnormalities in the bulb and second portion of the duodenum   ESOPHAGOGASTRODUODENOSCOPY (EGD) WITH PROPOFOL N/A 11/20/2015   Dr. [name]: normal with normal biopsies, negative H.pylori    ESOPHAGOGASTRODUODENOSCOPY (EGD) WITH PROPOFOL N/A 04/18/2016   Procedure:  ESOPHAGOGASTRODUODENOSCOPY (EGD) WITH PROPOFOL;  Surgeon: [name], MD;  [facility];  Service: Endoscopy;  Laterality: N/A;   FLEXIBLE SIGMOIDOSCOPY N/A 04/18/2016   Procedure: FLEXIBLE SIGMOIDOSCOPY;  Surgeon: [name], MD;  [facility];  Service: Endoscopy;  Laterality: N/A;   LYSIS OF ADHESION N/A 01/29/2021   Procedure: LYSIS OF ADHESION;  Surgeon: [name], MD;  Location: [facility];   Service: General;  Laterality: N/A;   TONSILLECTOMY      Family History  Problem Relation Age of Onset   Asthma Mother    Ulcers Mother    Bipolar disorder Mother    ADD / ADHD Father    Ulcerative colitis Maternal Aunt    Cancer Maternal Aunt        Leukemia   Ulcerative colitis Paternal Uncle    Colon polyps Maternal Grandmother    Diabetes Maternal Grandmother    Hypertension Maternal Grandmother    Sleep apnea Maternal Grandmother    Heart disease Maternal Grandmother    Diabetes Maternal Grandfather    Hypertension Maternal Grandfather    Sleep apnea Maternal Grandfather    Heart disease Maternal Grandfather    Cancer Maternal Grandfather        prostate   Colon cancer Neg Hx    Liver disease Neg Hx     Social History   Socioeconomic History   Marital status: Single    Spouse name: Not on file   Number of children: 1   Years of education: 12th   Highest education level: Not on file  Occupational History    Employer: NOT EMPLOYED    Comment: disabled  Tobacco Use   Smoking status: Former    Packs/day: 0.00    Years: 2.00    Pack years: 0.00    Types: Cigarettes    Quit date: 10/06/2012    Years since quitting: 8.6   Smokeless tobacco: Never   Tobacco comments:    Never smoked cigarettes  Vaping Use   Vaping Use: Never used  Substance and Sexual Activity   Alcohol use: No    Alcohol/week: 0.0 standard drinks    Comment: denies usage   Drug use: Not Currently    Types: Marijuana   Sexual activity: Yes    Birth control/protection: None  Other Topics Concern   Not on file  Social History Narrative   Lives with mother   Cory Roughen and Aunt in the home.   Caffeine Use: occasionally   Diet: eats all food groups   Water: 6-8 cups daily       Likes to game late and sleep in until noon   [facility]      Does not drive   Wears seat belt    Smoke detectors    No weapons   Right handed   Social Determinants of Health    Financial Resource Strain: Low Risk    Difficulty of Paying Living Expenses: Not hard at all  Food Insecurity: No Food Insecurity   Worried About Charity fundraiser in the Last Year: Never true   Arboriculturist in the Last Year: Never true  Transportation Needs: No Transportation Needs   Lack of Transportation (Medical): No   Lack of Transportation (Non-Medical): No  Physical Activity: Inactive   Days of Exercise per Week: 0 days   Minutes of Exercise per Session: 0 min  Stress: Stress Concern Present   Feeling of  Stress : To some extent  Social Connections: Moderately Isolated   Frequency of Communication with Friends and Family: More than three times a week   Frequency of Social Gatherings with Friends and Family: Twice a week   Attends Religious Services: 1 to 4 times per year   Active Member of [employer] or Organizations: No   Attends Archivist Meetings: Never   Marital Status: Separated  Intimate Partner Violence: Not At Risk   Fear of Current or Ex-Partner: No   Emotionally Abused: No   Physically Abused: No   Sexually Abused: No    Outpatient Medications Prior to Visit  Medication Sig Dispense Refill   albuterol (PROVENTIL) (2.5 MG/3ML) 0.083% nebulizer solution Inhale 3 mLs (2.5 mg total) into the lungs every 6 (six) hours as needed for wheezing or shortness of breath. 60 vial 2   cholecalciferol (VITAMIN D) 1000 units tablet Take 1 tablet (1,000 Units total) by mouth daily. START AFTER VITAMIN D 50,000 U TABLETS ARE COMPLETE (Patient taking differently: Take 1,000 Units by mouth daily.) 30 tablet 11   EPINEPHrine 0.3 mg/0.3 mL IJ SOAJ injection Inject 0.3 mg into the muscle once as needed (for severe allergic reaction). 1 each 1   fluticasone (FLONASE) 50 MCG/ACT nasal spray Place 1 spray into both nostrils daily. (Patient taking differently: Place 1 spray into both nostrils daily as needed for allergies.) 16 g 0   Respiratory Therapy Supplies (NEBULIZER  COMPRESSOR) KIT And supplies 1 each 0   loratadine (CLARITIN) 10 MG tablet Take 1 tablet (10 mg total) by mouth daily. 90 tablet 1   montelukast (SINGULAIR) 10 MG tablet Take 1 tablet (10 mg total) by mouth at bedtime. 90 tablet 1   oxyCODONE (OXY IR/ROXICODONE) 5 MG immediate release tablet Take 1 tablet (5 mg total) by mouth every 6 (six) hours as needed for severe pain. 15 tablet 0   pantoprazole (PROTONIX) 20 MG tablet Take 1 tablet (20 mg total) by mouth daily. 90 tablet 0   promethazine (PHENERGAN) 25 MG tablet Take 1 tablet (25 mg total) by mouth every 6 (six) hours as needed for nausea or vomiting. 30 tablet 5   propranolol (INDERAL) 10 MG tablet Take 1 tablet (10 mg total) by mouth 3 (three) times daily. 90 tablet 5   SUMAtriptan (IMITREX) 100 MG tablet Take 1 tablet earliest onset of headache.  May repeat in 2 hours if headache persists or recurs.  Maximum 2 tablets in 24 hours. 10 tablet 5   traZODone (DESYREL) 150 MG tablet Take 1 tablet (150 mg total) by mouth at bedtime. 30 tablet 1   No facility-administered medications prior to visit.    Allergies  Allergen Reactions   Nsaids Other (See Comments)    Should not take due to GI condition   Other Other (See Comments)    Should not take 2/2 GI condition Other reaction(s): Other (See Comments) Should not take due to GI condition   Pineapple Swelling and Other (See Comments)    Reaction:  Lip swelling   Remicade [Infliximab] Other (See Comments)    AUTOIMMUNE HEPATITIS   Strawberry Extract Swelling    Reaction:  Lip swelling   Amoxicillin-Pot Clavulanate Nausea And Vomiting and Other (See Comments)    GI intolerance    Omeprazole Nausea And Vomiting   Tomato Rash    Review of Systems  Constitutional: Negative.   HENT: Negative.    Eyes: Negative.   Respiratory: Negative.    Cardiovascular: Negative.  Gastrointestinal: Negative.   Endocrine: Negative.   Genitourinary: Negative.   Musculoskeletal: Negative.   Skin:  Negative.   Allergic/Immunologic: Negative.   Neurological: Negative.   Hematological: Negative.   Psychiatric/Behavioral: Negative.        Objective:    Physical Exam Constitutional:      Appearance: Normal appearance. He is obese.  HENT:     Head: Normocephalic and atraumatic.     Right Ear: Tympanic membrane, ear canal and external ear normal.     Left Ear: Tympanic membrane, ear canal and external ear normal.     Nose: Nose normal.     Mouth/Throat:     Mouth: Mucous membranes are moist.     Pharynx: Oropharynx is clear.  Eyes:     Extraocular Movements: Extraocular movements intact.     Conjunctiva/sclera: Conjunctivae normal.     Pupils: Pupils are equal, round, and reactive to light.  Cardiovascular:     Rate and Rhythm: Normal rate and regular rhythm.     Pulses: Normal pulses.     Heart sounds: Normal heart sounds.  Pulmonary:     Effort: Pulmonary effort is normal.     Breath sounds: Normal breath sounds.  Abdominal:     General: Abdomen is flat. Bowel sounds are normal.     Palpations: Abdomen is soft.  Musculoskeletal:        General: Normal range of motion.     Cervical back: Normal range of motion and neck supple.  Skin:    General: Skin is warm and dry.     Capillary Refill: Capillary refill takes less than 2 seconds.  Neurological:     General: No focal deficit present.     Mental Status: He is alert and oriented to person, place, and time.     Cranial Nerves: No cranial nerve deficit.     Sensory: No sensory deficit.     Motor: No weakness.     Coordination: Coordination normal.     Gait: Gait normal.  Psychiatric:        Mood and Affect: Mood normal.        Behavior: Behavior normal.        Thought Content: Thought content normal.        Judgment: Judgment normal.    BP (!) 139/92    Pulse 70    Ht _0  (1.727 m)    Wt 250 lb 1.9 oz (113.5 kg)    SpO2 97%    BMI 38.03 kg/m  Wt Readings from Last 3 Encounters:  06/04/21 250 lb 1.9 oz (113.5  kg)  04/25/21 252 lb 3.3 oz (114.4 kg)  04/15/21 264 lb 8.8 oz (120 kg)    Health Maintenance Due  Topic Date Due   COVID-19 Vaccine (3 - Booster for Moderna series) 02/29/2020    There are no preventive care reminders to display for this patient.   Lab Results  Component Value Date   TSH 2.909 03/24/2016   Lab Results  Component Value Date   WBC 9.0 01/29/2021   HGB 15.2 01/29/2021   HCT 45.7 01/29/2021   MCV 85.4 01/29/2021   PLT 243 01/29/2021   Lab Results  Component Value Date   NA 135 01/29/2021   K 4.1 01/29/2021   CO2 22 01/29/2021   GLUCOSE 104 (H) 01/29/2021   BUN 10 01/29/2021   CREATININE 0.96 01/29/2021   BILITOT 0.6 01/29/2021   ALKPHOS 68 01/29/2021   AST 23 01/29/2021  ALT 19 01/29/2021   PROT 7.6 01/29/2021   ALBUMIN 4.3 01/29/2021   CALCIUM 9.6 01/29/2021   ANIONGAP 11 01/29/2021   EGFR 122 11/30/2020   Lab Results  Component Value Date   CHOL 153 05/05/2017   Lab Results  Component Value Date   HDL 37 (L) 05/05/2017   Lab Results  Component Value Date   LDLCALC 90 05/05/2017   Lab Results  Component Value Date   TRIG 164 (H) 05/05/2017   Lab Results  Component Value Date   CHOLHDL 4.1 05/05/2017   Lab Results  Component Value Date   HGBA1C 5.8 (H) 04/04/2020       Assessment & Plan:   Problem List Items Addressed This Visit       Cardiovascular and Mediastinum   Migraine   Relevant Medications   traZODone (DESYREL) 150 MG tablet   propranolol (INDERAL) 10 MG tablet   SUMAtriptan (IMITREX) 100 MG tablet     Digestive   Autoimmune hepatitis ([place])    -check LFTs; if elevated, would consider a new GI referral        Other   Abnormal thyroid function test    -check thyroid labs today      Relevant Orders   TSH + free T4   ADHD   Relevant Medications   traZODone (DESYREL) 150 MG tablet   Environmental allergies   Relevant Medications   montelukast (SINGULAIR) 10 MG tablet   loratadine (CLARITIN) 10 MG  tablet   Bipolar 1 disorder (HCC)   Relevant Medications   traZODone (DESYREL) 150 MG tablet   Iron deficiency anemia    -check iron panel with labs      Relevant Orders   Iron, TIBC and Ferritin Panel   Encounter for general adult medical examination with abnormal findings - Primary    -discussed COVID booster; otherwise he is up to date on screenings/immunizations      Relevant Orders   CBC with Differential/Platelet   CMP14+EGFR   Lipid Panel With LDL/HDL Ratio   TSH + free T4   Iron, TIBC and Ferritin Panel   Other Visit Diagnoses     Gastroesophageal reflux disease, unspecified whether esophagitis present       Relevant Medications   pantoprazole (PROTONIX) 20 MG tablet        Meds ordered this encounter  Medications   traZODone (DESYREL) 150 MG tablet    Sig: Take 1 tablet (150 mg total) by mouth at bedtime.    Dispense:  30 tablet    Refill:  1   pantoprazole (PROTONIX) 20 MG tablet    Sig: Take 1 tablet (20 mg total) by mouth daily.    Dispense:  90 tablet    Refill:  0   montelukast (SINGULAIR) 10 MG tablet    Sig: Take 1 tablet (10 mg total) by mouth at bedtime.    Dispense:  90 tablet    Refill:  1   loratadine (CLARITIN) 10 MG tablet    Sig: Take 1 tablet (10 mg total) by mouth daily.    Dispense:  90 tablet    Refill:  1   propranolol (INDERAL) 10 MG tablet    Sig: Take 1 tablet (10 mg total) by mouth 3 (three) times daily.    Dispense:  90 tablet    Refill:  5   SUMAtriptan (IMITREX) 100 MG tablet    Sig: Take 1 tablet earliest onset of headache.  May repeat in 2  hours if headache persists or recurs.  Maximum 2 tablets in 24 hours.    Dispense:  10 tablet    Refill:  [place], NP

## 2021-06-04 NOTE — Assessment & Plan Note (Signed)
-  check LFTs; if elevated, would consider a new GI referral

## 2021-06-04 NOTE — Assessment & Plan Note (Signed)
-  discussed COVID booster; otherwise he is up to date on screenings/immunizations

## 2021-06-04 NOTE — Assessment & Plan Note (Signed)
-  check thyroid labs today

## 2021-06-04 NOTE — Assessment & Plan Note (Signed)
-  check iron panel with labs

## 2021-06-04 NOTE — Patient Instructions (Signed)
Please have fasting labs drawn at your earliest convenience. ° °I will be moving to [facility] located at [address], [place], [place] [address] effective Jun 09, 2021. °If you would like to establish care with [facility] please call [phone]. °

## 2021-06-06 ENCOUNTER — Emergency Department
Admit: 2021-06-06 | Discharge: 2021-06-07 | Disposition: A | Discharge status: Other Acute Inpatient Hospital | Payer: MEDICAID

## 2021-06-06 ENCOUNTER — Ambulatory Visit
Admit: 2021-06-06 | Discharge: 2021-06-07 | Disposition: A | Discharge status: Other Acute Inpatient Hospital | Payer: MEDICAID

## 2021-07-07 DIAGNOSIS — H109 Unspecified conjunctivitis: Principal | ICD-10-CM

## 2021-07-07 MED ORDER — ERYTHROMYCIN 5 MG/GRAM (0.5 %) EYE OINTMENT
0 refills | 0 days | Status: CP
Start: 2021-07-07 — End: ?

## 2021-07-08 ENCOUNTER — Ambulatory Visit: Admit: 2021-07-08 | Discharge: 2021-07-08 | Disposition: A | Discharge status: Home / Self Care | Payer: MEDICAID

## 2021-09-10 DIAGNOSIS — S60459A Superficial foreign body of unspecified finger, initial encounter: Principal | ICD-10-CM

## 2021-09-10 MED ORDER — CEPHALEXIN 500 MG CAPSULE
ORAL_CAPSULE | Freq: Three times a day (TID) | ORAL | 0 refills | 7 days | Status: CP
Start: 2021-09-10 — End: 2021-09-17

## 2021-09-11 ENCOUNTER — Ambulatory Visit: Payer: PRIVATE HEALTH INSURANCE

## 2021-09-17 ENCOUNTER — Other Ambulatory Visit: Payer: Self-pay

## 2021-09-17 DIAGNOSIS — K219 Gastro-esophageal reflux disease without esophagitis: Secondary | ICD-10-CM

## 2021-09-17 MED ORDER — PANTOPRAZOLE SODIUM 20 MG PO TBEC: 20.0000 mg | DELAYED_RELEASE_TABLET | Freq: Every day | ORAL | 0 refills | Status: DC

## 2021-11-15 NOTE — Progress Notes (Deleted)
NEUROLOGY FOLLOW UP OFFICE NOTE  Wayne Avila [id number]  Assessment/Plan:   1.  Migraine without aura, without status migrainosus, not intractable 2.  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  Bipolar 1 disorder/mental developmental delay   1.  As migraines are infrequent, defer preventative medication 2.  For rescue:  Sumatriptan 153m with promethazine 261m  Stop Fioricet 3.  Limit use of pain relievers to no more than 2 days out of week to prevent risk of rebound or medication-overuse headache. 4.  Keep headache diary 5. Follow up 6 months.     Subjective:  Wayne Avila a [age]ear old male with ADHD, Bipolar 1 disorder, ulcerative colitis and remote history of seizures (not on medication) who follows up for migraines.  UPDATE: Intensity:  *** Duration:  *** Frequency:  *** Frequency of abortive medication: *** Current NSAIDS/analgesics:  Fioricet, Tylenol Current triptans:  none Current ergotamine:  none Current anti-emetic:  none Current muscle relaxants:  none Current Antihypertensive medications:  Propranolol 1079mID Current Antidepressant medications:  Trazodone  Current Anticonvulsant medications:  none Current anti-CGRP:  none Current Vitamins/Herbal/Supplements:  D Current Antihistamines/Decongestants:  Flonase, Claritin Other therapy:  none Hormone/birth control:  none  Caffeine:  No coffee.  No soda Alcohol:  1 drink every 2 weeks Smoker:  1 cigarette a day Diet:  1 to 2 glasses of water.  No soda.  Does not skip meals Exercise:  no Depression:  no; Anxiety:  no Other pain:  no Sleep:  Sleeps well Family history of headache: Mom, aunt  HISTORY:  Onset:  2-3 years ago Location:  Predominantly frontal and temporal bilaterally Quality:  pounding Intensity:  severe Aura:  no Prodrome:  no Associated symptoms:  Nausea, vomiting, photophobia, phonophobia, elevated blood pressure.  He denies associated unilateral numbness or  weakness. Duration:  1 to 2 days.  With Fioricet, 15-20 days Frequency:  1 to 2 times a month Frequency of abortive medication: 2 times a month Triggers:  unsure Relieving factors:  Nothing.   Activity:  Movement aggravates migraine   CT head on 08/27/2017 to evaluate headache personally reviewed and was normal.  He had a seizure in 2015.     Remote history of seizures.  First seizure occurred in February 2014, which occurred in the middle of an argument.  Semiology staring episode, loss of consciousness and convulsions.  MRI of brain with and without contrast on 04/03/2014 personally reviewed was normal.  EEGs were normal.  He was admitted to the EMU at [facility] 2015 but did not have an event during stay.  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  He was previously on Depakote.  He hasn't had a recurrent spell in years.       Past NSAIDS/analgesics:  Ibuprofen, tramadol, maybe an opioid in the ED Past abortive triptans:  none Past abortive ergotamine:  none Past muscle relaxants:  Flexeril Past anti-emetic:  Zofran, Reglan, Phenergan Past antihypertensive medications:  clonidine Past antidepressant medications:  sertraline Past anticonvulsant medications:  Depakote Past anti-CGRP:  none Past vitamins/Herbal/Supplements:  none Past antihistamines/decongestants:  Benadryl, Sudafed Other past therapies:  none     PAST MEDICAL HISTORY: Past Medical History:  Diagnosis Date   ADHD (attention deficit hyperactivity disorder)    Anemia    Anxiety    Asthma    [place]immune [place] ([place]  B[place] 1 disorder ([place]  Bipolar 1 disorder, manic, moderate ([place]/07/2014  C. difficile colitis 03/22/2016   C. difficile diarrhea 03/22/2016   Clostridium difficile colitis    Complication of anesthesia    Depression    Family history of adverse reaction to anesthesia    mother- N/v and headache   GERD (gastroesophageal reflux disease)    Headache    HOH  (hard of hearing)    Hypertension    Mental developmental delay    Pneumonia    PONV (postoperative nausea and vomiting)    Pre-diabetes    Seizures ([place])    Ulcerative colitis ([place])     MEDICATIONS: Current Outpatient Medications on File Prior to Visit  Medication Sig Dispense Refill   albuterol (PROVENTIL) (2.5 MG/3ML) 0.083% nebulizer solution Inhale 3 mLs (2.5 mg total) into the lungs every 6 (six) hours as needed for wheezing or shortness of breath. 60 vial 2   cholecalciferol (VITAMIN D) 1000 units tablet Take 1 tablet (1,000 Units total) by mouth daily. START AFTER VITAMIN D 50,000 U TABLETS ARE COMPLETE (Patient taking differently: Take 1,000 Units by mouth daily.) 30 tablet 11   EPINEPHrine 0.3 mg/0.3 mL IJ SOAJ injection Inject 0.3 mg into the muscle once as needed (for severe allergic reaction). 1 each 1   fluticasone (FLONASE) 50 MCG/ACT nasal spray Place 1 spray into both nostrils daily. (Patient taking differently: Place 1 spray into both nostrils daily as needed for allergies.) 16 g 0   loratadine (CLARITIN) 10 MG tablet Take 1 tablet (10 mg total) by mouth daily. 90 tablet 1   montelukast (SINGULAIR) 10 MG tablet Take 1 tablet (10 mg total) by mouth at bedtime. 90 tablet 1   pantoprazole (PROTONIX) 20 MG tablet Take 1 tablet (20 mg total) by mouth daily. 90 tablet 0   propranolol (INDERAL) 10 MG tablet Take 1 tablet (10 mg total) by mouth 3 (three) times daily. 90 tablet 5   Respiratory Therapy Supplies (NEBULIZER COMPRESSOR) KIT And supplies 1 each 0   SUMAtriptan (IMITREX) 100 MG tablet Take 1 tablet earliest onset of headache.  May repeat in 2 hours if headache persists or recurs.  Maximum 2 tablets in 24 hours. 10 tablet 5   traZODone (DESYREL) 150 MG tablet Take 1 tablet (150 mg total) by mouth at bedtime. 30 tablet 1   No current facility-administered medications on file prior to visit.    ALLERGIES: Allergies  Allergen Reactions   Nsaids Other (See Comments)     Should not take due to GI condition   Other Other (See Comments)    Should not take 2/2 GI condition Other reaction(s): Other (See Comments) Should not take due to GI condition   Pineapple Swelling and Other (See Comments)    Reaction:  Lip swelling   Remicade [Infliximab] Other (See Comments)    AUTOIMMUNE HEPATITIS   Strawberry Extract Swelling    Reaction:  Lip swelling   Amoxicillin-Pot Clavulanate Nausea And Vomiting and Other (See Comments)    GI intolerance    Omeprazole Nausea And Vomiting   Tomato Rash    FAMILY HISTORY: Family History  Problem Relation Age of Onset   Asthma Mother    Ulcers Mother    Bipolar disorder Mother    ADD / ADHD Father    Ulcerative colitis Maternal Aunt    Cancer Maternal Aunt        Leukemia   Ulcerative colitis Paternal Uncle    Colon polyps Maternal Grandmother    Diabetes Maternal Grandmother    Hypertension Maternal  Grandmother    Sleep apnea Maternal Grandmother    Heart disease Maternal Grandmother    Diabetes Maternal Grandfather    Hypertension Maternal Grandfather    Sleep apnea Maternal Grandfather    Heart disease Maternal Grandfather    Cancer Maternal Grandfather        prostate   Colon cancer Neg Hx    Liver disease Neg Hx       Objective:  *** General: No acute distress.  Patient appears well-groomed.   Head:  Normocephalic/atraumatic Eyes:  Fundi examined but not visualized Neck: supple, no paraspinal tenderness, full range of motion Heart:  Regular rate and rhythm Lungs:  Clear to auscultation bilaterally Back: No paraspinal tenderness Neurological Exam: alert and oriented to person, place, and time.  Speech fluent and not dysarthric, language intact.  CN II-XII intact. Bulk and tone normal, muscle strength 5/5 throughout.  Sensation to light touch intact.  Deep tendon reflexes 2+ throughout, toes downgoing.  Finger to nose testing intact.  Gait normal, Romberg negative.   Metta Clines, DO  CC: Cherly Beach, NP

## 2021-11-18 ENCOUNTER — Ambulatory Visit: Payer: Medicaid Other | Admitting: Neurology

## 2021-11-28 ENCOUNTER — Encounter: Payer: Self-pay | Admitting: Nurse Practitioner

## 2021-11-28 ENCOUNTER — Ambulatory Visit (INDEPENDENT_AMBULATORY_CARE_PROVIDER_SITE_OTHER): Payer: Medicaid Other | Admitting: Nurse Practitioner

## 2021-11-28 VITALS — BP 117/82 | HR 59 | Ht 68.0 in | Wt 237.0 lb

## 2021-11-28 DIAGNOSIS — F901 Attention-deficit hyperactivity disorder, predominantly hyperactive type: Secondary | ICD-10-CM

## 2021-11-28 DIAGNOSIS — J4520 Mild intermittent asthma, uncomplicated: Secondary | ICD-10-CM

## 2021-11-28 DIAGNOSIS — Z6841 Body Mass Index (BMI) 40.0 and over, adult: Secondary | ICD-10-CM

## 2021-11-28 DIAGNOSIS — M722 Plantar fascial fibromatosis: Secondary | ICD-10-CM

## 2021-11-28 DIAGNOSIS — F319 Bipolar disorder, unspecified: Secondary | ICD-10-CM

## 2021-11-28 MED ORDER — TRAZODONE HCL 150 MG PO TABS: 150.0000 mg | ORAL_TABLET | Freq: Every day | ORAL | 1 refills | Status: DC

## 2021-11-28 MED ORDER — METHYLPREDNISOLONE ACETATE 80 MG/ML IJ SUSP
80.0000 mg | Freq: Once | INTRAMUSCULAR | Status: AC
  Administered 2021-11-28: 80 mg via INTRAMUSCULAR

## 2021-11-28 NOTE — Patient Instructions (Addendum)
You were given a steroid injection today to help your help your foot pain, please follow up with podiatrist as dicussed    It is important that you exercise regularly at least 30 minutes 5 times a week.  Think about what you will eat, plan ahead. Choose " clean, green, fresh or frozen" over canned, processed or packaged foods which are more sugary, salty and fatty. 70 to 75% of food eaten should be vegetables and fruit. Three meals at set times with snacks allowed between meals, but they must be fruit or vegetables. Aim to eat over a 12 hour period , example 7 am to 7 pm, and STOP after  your last meal of the day. Drink water,generally about 64 ounces per day, no other drink is as healthy. Fruit juice is best enjoyed in a healthy way, by EATING the fruit.  Thanks for choosing [facility], we consider it a privelige to serve you.

## 2021-11-28 NOTE — Progress Notes (Signed)
Wayne Avila     MRN: [id number]      DOB: May 27, 1991   HPI Wayne Avila with past medical history of GERD, bipolar 1 disorder, obesity, ADHD, asthma is here for follow up and re-evaluation of chronic medical conditions, medication management.    Plantar fascitis .pt c/o left heel swelling and pain  since the past couple of months, heel  is painful with walking, denies numbness , tingling , pain is currently  9/10, he stated that he  gets muscle relaxants for his grandma that he has been using.  Asthma.  Patient stated that his asthma has been well controlled he uses his albuterol inhaler about once a week.  Denies cough, wheezing, shortness of breath.   ADHD, bipolar 1 disorder.  Patient stated that he is currently on trazodone 150 mg daily not on medications for ADHD, denies SI, HI refused referral to psychiatry     ROS Denies recent fever or chills. Denies sinus pressure, nasal congestion, ear pain or sore throat. Denies chest congestion, productive cough or wheezing. Denies chest pains, palpitations and leg swelling Denies abdominal pain, nausea, vomiting,diarrhea or constipation.   Denies dysuria, frequency, hesitancy or incontinence. Denies headaches, seizures, numbness, or tingling. Denies depression, anxiety or insomnia. Denies skin break down or rash.   PE  BP 117/82 (BP Location: Right Arm, Patient Position: Sitting, Cuff Size: Large)   Pulse (!) 59   Ht 5' 8"  (1.727 m)   Wt 237 lb (107.5 kg)   SpO2 97%   BMI 36.04 kg/m   Patient alert and oriented and in no cardiopulmonary distress.  Chest: Clear to auscultation bilaterally.  CVS: S1, S2 no murmurs, no S3.Regular rate.  ABD: Soft non tender.   Ext: No edema  MS: Adequate ROM spine, shoulders, hips and knees.  Tenderness on palpation of left heel skin dry and intact  Skin: Intact, no ulcerations or rash noted.  Psych: Good eye contact, normal affect. Memory intact not anxious or depressed  appearing.  CNS: CN 2-12 intact, power,  normal throughout.no focal deficits noted.   Assessment & Plan  Attention-deficit hyperactivity disorder, unspecified type Currently not on medications Denies SI, HI Refused referral to psychiatrist  Plantar fasciitis, left Chronic condition allergic to NSAIDs Depo-Medrol 80 mg IM injection given today Patient referred to podiatry  Asthma in adult, mild intermittent, uncomplicated Condition well-controlled Denies shortness of breath, wheezing Continue albuterol inhaler every 6 hours as needed  Class 3 severe obesity due to excess calories with serious comorbidity and body mass index (BMI) of 40.0 to 44.9 in adult (HCC) Wt Readings from Last 3 Encounters:  11/28/21 237 lb (107.5 kg)  06/04/21 250 lb 1.9 oz (113.5 kg)  04/25/21 252 lb 3.3 oz (114.4 kg)  Patient stated that he does a lot of walking at work. Need to increase intake of whole food consisting mainly vegetables and protein less carbohydrate drinking at least 64 ounces of water daily instead of juice engaging in regular vigorous exercises at least 150 minutes weekly discussed with patient verbalized understanding.   Bipolar 1 disorder ([place]) He  stated that he is currently on trazodone 150 mg daily, I doubt that the patient has been taking this medication because he should have run out of refills by now if he has been taking medication as ordered. Trazodone  150 mg daily reordered Patient is accompanied to today's visit by his guardian who thinks that the patient needs referral to psychiatrist but the patient refused referral  to psychiatrist today. He denies SI, HI

## 2021-11-28 NOTE — Assessment & Plan Note (Addendum)
Currently not on medications Denies SI, HI Refused referral to psychiatrist

## 2021-11-28 NOTE — Assessment & Plan Note (Signed)
Condition well-controlled Denies shortness of breath, wheezing Continue albuterol inhaler every 6 hours as needed

## 2021-11-28 NOTE — Assessment & Plan Note (Signed)
Wt Readings from Last 3 Encounters:  11/28/21 237 lb (107.5 kg)  06/04/21 250 lb 1.9 oz (113.5 kg)  04/25/21 252 lb 3.3 oz (114.4 kg)  Patient stated that he does a lot of walking at work. Need to increase intake of whole food consisting mainly vegetables and protein less carbohydrate drinking at least 64 ounces of water daily instead of juice engaging in regular vigorous exercises at least 150 minutes weekly discussed with patient verbalized understanding.

## 2021-11-28 NOTE — Assessment & Plan Note (Addendum)
He  stated that he is currently on trazodone 150 mg daily, I doubt that the patient has been taking this medication because he should have run out of refills by now if he has been taking medication as ordered. Trazodone  150 mg daily reordered Patient is accompanied to today's visit by his guardian who thinks that the patient needs referral to psychiatrist but the patient refused referral to psychiatrist today. He denies SI, HI

## 2021-11-28 NOTE — Assessment & Plan Note (Signed)
Chronic condition allergic to NSAIDs Depo-Medrol 80 mg IM injection given today Patient referred to podiatry

## 2021-12-03 ENCOUNTER — Ambulatory Visit: Payer: Medicaid Other | Admitting: Nurse Practitioner

## 2021-12-12 ENCOUNTER — Encounter: Payer: Self-pay | Admitting: Podiatry

## 2021-12-12 ENCOUNTER — Ambulatory Visit: Payer: Medicaid Other | Admitting: Podiatry

## 2021-12-12 ENCOUNTER — Ambulatory Visit (INDEPENDENT_AMBULATORY_CARE_PROVIDER_SITE_OTHER): Payer: Medicaid Other

## 2021-12-12 DIAGNOSIS — M722 Plantar fascial fibromatosis: Secondary | ICD-10-CM

## 2021-12-12 NOTE — Progress Notes (Signed)
Subjective:  Patient ID: Wayne Avila, male    DOB: March 24, 1991,  MRN: [id number]  Chief Complaint  Patient presents with   Foot Pain    NP - Plantar fasciitis, left    31 y.o. male presents with the above complaint.  Patient presents with complaint left heel pain that has been going for quite some time but is gotten worse worse with ambulation.  Pain scale 7 out of 10 he would like to discuss treatment options.  He has not seen anyone else prior to seeing me.  Post attic dyskinesia type of symptoms experienced.   Review of Systems: Negative except as noted in the HPI. Denies N/V/F/Ch.  Past Medical History:  Diagnosis Date   ADHD (attention deficit hyperactivity disorder)    Anemia    Anxiety    Asthma    Autoimmune hepatitis ([place])    Bipolar 1 disorder (HCC)    Bipolar 1 disorder, manic, moderate (HCC) 11/09/2014   C. difficile colitis 03/22/2016   C. difficile diarrhea 03/22/2016   Clostridium difficile colitis    Complication of anesthesia    Depression    Family history of adverse reaction to anesthesia    mother- N/v and headache   GERD (gastroesophageal reflux disease)    Headache    HOH (hard of hearing)    Hypertension    Mental developmental delay    Pneumonia    PONV (postoperative nausea and vomiting)    Pre-diabetes    Seizures (HCC)    Ulcerative colitis ([place])     Current Outpatient Medications:    albuterol (PROVENTIL) (2.5 MG/3ML) 0.083% nebulizer solution, Inhale 3 mLs (2.5 mg total) into the lungs every 6 (six) hours as needed for wheezing or shortness of breath., Disp: 60 vial, Rfl: 2   cholecalciferol (VITAMIN D) 1000 units tablet, Take 1 tablet (1,000 Units total) by mouth daily. START AFTER VITAMIN D 50,000 U TABLETS ARE COMPLETE (Patient taking differently: Take 1,000 Units by mouth daily.), Disp: 30 tablet, Rfl: 11   EPINEPHrine 0.3 mg/0.3 mL IJ SOAJ injection, Inject 0.3 mg into the muscle once as needed (for severe allergic reaction)., Disp: 1  each, Rfl: 1   fluticasone (FLONASE) 50 MCG/ACT nasal spray, Place 1 spray into both nostrils daily. (Patient taking differently: Place 1 spray into both nostrils daily as needed for allergies.), Disp: 16 g, Rfl: 0   loratadine (CLARITIN) 10 MG tablet, Take 1 tablet (10 mg total) by mouth daily., Disp: 90 tablet, Rfl: 1   montelukast (SINGULAIR) 10 MG tablet, Take 1 tablet (10 mg total) by mouth at bedtime., Disp: 90 tablet, Rfl: 1   pantoprazole (PROTONIX) 20 MG tablet, Take 1 tablet (20 mg total) by mouth daily., Disp: 90 tablet, Rfl: 0   propranolol (INDERAL) 10 MG tablet, Take 1 tablet (10 mg total) by mouth 3 (three) times daily., Disp: 90 tablet, Rfl: 5   Respiratory Therapy Supplies (NEBULIZER COMPRESSOR) KIT, And supplies, Disp: 1 each, Rfl: 0   SUMAtriptan (IMITREX) 100 MG tablet, Take 1 tablet earliest onset of headache.  May repeat in 2 hours if headache persists or recurs.  Maximum 2 tablets in 24 hours., Disp: 10 tablet, Rfl: 5   traZODone (DESYREL) 150 MG tablet, Take 1 tablet (150 mg total) by mouth at bedtime., Disp: 90 tablet, Rfl: 1  Social History   Tobacco Use  Smoking Status Former   Packs/day: 0.00   Years: 2.00   Total pack years: 0.00   Types: Cigarettes  Quit date: 10/06/2012   Years since quitting: 9.1  Smokeless Tobacco Never  Tobacco Comments   Never smoked cigarettes    Allergies  Allergen Reactions   Nsaids Other (See Comments)    Should not take due to GI condition   Other Other (See Comments)    Should not take 2/2 GI condition Other reaction(s): Other (See Comments) Should not take due to GI condition   Pineapple Swelling and Other (See Comments)    Reaction:  Lip swelling   Remicade [Infliximab] Other (See Comments)    AUTOIMMUNE HEPATITIS   Strawberry Extract Swelling    Reaction:  Lip swelling   Amoxicillin-Pot Clavulanate Nausea And Vomiting and Other (See Comments)    GI intolerance    Omeprazole Nausea And Vomiting   Tomato Rash    Objective:  There were no vitals filed for this visit. There is no height or weight on file to calculate BMI. Constitutional Well developed. Well nourished.  Vascular Dorsalis pedis pulses palpable bilaterally. Posterior tibial pulses palpable bilaterally. Capillary refill normal to all digits.  No cyanosis or clubbing noted. Pedal hair growth normal.  Neurologic Normal speech. Oriented to person, place, and time. Epicritic sensation to light touch grossly present bilaterally.  Dermatologic Nails well groomed and normal in appearance. No open wounds. No skin lesions.  Orthopedic: Normal joint ROM without pain or crepitus bilaterally. No visible deformities. Tender to palpation at the calcaneal tuber left. No pain with calcaneal squeeze left. Ankle ROM diminished range of motion left. Silfverskiold Test: positive left.   Radiographs: Taken and reviewed. No acute fractures or dislocations. No evidence of stress fracture.  Plantar heel spur absent. Posterior heel spur absent.  Pes planovalgus foot structure  Assessment:   1. Plantar fasciitis, left    Plan:  Patient was evaluated and treated and all questions answered.  Plantar Fasciitis, left - XR reviewed as above.  - Educated on icing and stretching. Instructions given.  - Injection delivered to the plantar fascia as below. - DME: Plantar fascial brace dispensed to support the medial longitudinal arch of the foot and offload pressure from the heel and prevent arch collapse during weightbearing - Pharmacologic management: None  Procedure: Injection Tendon/Ligament Location: Left plantar fascia at the glabrous junction; medial approach. Skin Prep: alcohol Injectate: 0.5 cc 0.5% marcaine plain, 0.5 cc of 1% Lidocaine, 0.5 cc kenalog 10. Disposition: Patient tolerated procedure well. Injection site dressed with a band-aid.  No follow-ups on file.

## 2021-12-15 ENCOUNTER — Other Ambulatory Visit: Payer: Self-pay

## 2021-12-15 ENCOUNTER — Encounter (HOSPITAL_COMMUNITY): Payer: Self-pay | Admitting: *Deleted

## 2021-12-15 ENCOUNTER — Emergency Department (HOSPITAL_COMMUNITY)
Admission: EM | Admit: 2021-12-15 | Discharge: 2021-12-16 | Disposition: A | Discharge status: Home / Self Care | Payer: Medicaid Other | Attending: Emergency Medicine | Admitting: Emergency Medicine

## 2021-12-15 ENCOUNTER — Emergency Department (HOSPITAL_COMMUNITY): Payer: Medicaid Other

## 2021-12-15 DIAGNOSIS — J40 Bronchitis, not specified as acute or chronic: Secondary | ICD-10-CM | POA: Diagnosis not present

## 2021-12-15 DIAGNOSIS — R079 Chest pain, unspecified: Secondary | ICD-10-CM | POA: Diagnosis present

## 2021-12-15 LAB — CBC WITH DIFFERENTIAL/PLATELET
Abs Immature Granulocytes: 0.02 10*3/uL (ref 0.00–0.07)
Basophils Absolute: 0 10*3/uL (ref 0.0–0.1)
Basophils Relative: 1 %
Eosinophils Absolute: 0.3 10*3/uL (ref 0.0–0.5)
Eosinophils Relative: 4 %
HCT: 42.8 % (ref 39.0–52.0)
Hemoglobin: 14.6 g/dL (ref 13.0–17.0)
Immature Granulocytes: 0 %
Lymphocytes Relative: 26 %
Lymphs Abs: 1.9 10*3/uL (ref 0.7–4.0)
MCH: 29.3 pg (ref 26.0–34.0)
MCHC: 34.1 g/dL (ref 30.0–36.0)
MCV: 85.9 fL (ref 80.0–100.0)
Monocytes Absolute: 0.7 10*3/uL (ref 0.1–1.0)
Monocytes Relative: 10 %
Neutro Abs: 4.2 10*3/uL (ref 1.7–7.7)
Neutrophils Relative %: 59 %
Platelets: 202 10*3/uL (ref 150–400)
RBC: 4.98 MIL/uL (ref 4.22–5.81)
RDW: 13.2 % (ref 11.5–15.5)
WBC: 7.1 10*3/uL (ref 4.0–10.5)
nRBC: 0 % (ref 0.0–0.2)

## 2021-12-15 MED ORDER — IPRATROPIUM-ALBUTEROL 0.5-2.5 (3) MG/3ML IN SOLN
3.0000 mL | Freq: Once | RESPIRATORY_TRACT | Status: AC
  Administered 2021-12-15: 3 mL via RESPIRATORY_TRACT
  Filled 2021-12-15: qty 3

## 2021-12-15 MED ORDER — ACETAMINOPHEN 500 MG PO TABS
1000.0000 mg | ORAL_TABLET | Freq: Once | ORAL | Status: AC
  Administered 2021-12-15: 1000 mg via ORAL
  Filled 2021-12-15: qty 2

## 2021-12-15 NOTE — ED Notes (Signed)
Ambulatory to restroom without assist.

## 2021-12-15 NOTE — ED Provider Notes (Signed)
[facility] Provider Note   CSN: [id number] Arrival date & time: 12/15/21  2055     History {Add pertinent medical, surgical, social history, OB history to HPI:1} Chief Complaint  Patient presents with   Chest Pain    +cough    Wayne Avila is a 31 y.o. male.  HPI    31 year old patient comes in with chief complaint of chest pain and cough.  He indicates that his been having cough for the last 3 days and chest pain associated with it.  Chest pain is midsternal and nonradiating.  He has history of asthma, denies any associated wheezing, nausea, vomiting, fevers, chills.  Cough is nonproductive.  Patient also denies any substance use disorder, heavy tobacco use and there is no premature CAD in the family. Pt has no hx of PE, DVT and denies any exogenous hormone (testosterone / estrogen) use, long distance travels or surgery in the past 6 weeks, active cancer, recent immobilization.   Home Medications Prior to Admission medications   Medication Sig Start Date End Date Taking? Authorizing Provider  albuterol (PROVENTIL) (2.5 MG/3ML) 0.083% nebulizer solution Inhale 3 mLs (2.5 mg total) into the lungs every 6 (six) hours as needed for wheezing or shortness of breath. 04/29/17   Raylene Everts, MD  cholecalciferol (VITAMIN D) 1000 units tablet Take 1 tablet (1,000 Units total) by mouth daily. START AFTER VITAMIN D 50,000 U TABLETS ARE COMPLETE Patient taking differently: Take 1,000 Units by mouth daily. 04/08/16   Fields, Marga Melnick, MD  EPINEPHrine 0.3 mg/0.3 mL IJ SOAJ injection Inject 0.3 mg into the muscle once as needed (for severe allergic reaction). 10/04/20   Noreene Larsson, NP  fluticasone (FLONASE) 50 MCG/ACT nasal spray Place 1 spray into both nostrils daily. Patient taking differently: Place 1 spray into both nostrils daily as needed for allergies. 02/11/18   Rancour, Annie Main, MD  loratadine (CLARITIN) 10 MG tablet Take 1 tablet (10 mg total) by mouth  daily. 06/04/21   Noreene Larsson, NP  montelukast (SINGULAIR) 10 MG tablet Take 1 tablet (10 mg total) by mouth at bedtime. 06/04/21   Noreene Larsson, NP  pantoprazole (PROTONIX) 20 MG tablet Take 1 tablet (20 mg total) by mouth daily. 09/17/21   Paseda, Dewaine Conger, FNP  propranolol (INDERAL) 10 MG tablet Take 1 tablet (10 mg total) by mouth 3 (three) times daily. 06/04/21   Noreene Larsson, NP  Respiratory Therapy Supplies (NEBULIZER COMPRESSOR) KIT And supplies 04/29/17   Raylene Everts, MD  SUMAtriptan (IMITREX) 100 MG tablet Take 1 tablet earliest onset of headache.  May repeat in 2 hours if headache persists or recurs.  Maximum 2 tablets in 24 hours. 06/04/21   Noreene Larsson, NP  traZODone (DESYREL) 150 MG tablet Take 1 tablet (150 mg total) by mouth at bedtime. 11/28/21   Renee Rival, FNP      Allergies    Nsaids, Other, Pineapple, Remicade [infliximab], Strawberry extract, Amoxicillin-pot clavulanate, Omeprazole, and Tomato    Review of Systems   Review of Systems  All other systems reviewed and are negative.   Physical Exam Updated Vital Signs BP 134/82 (BP Location: Right Arm)   Pulse 74   Temp 98.5 F (36.9 C) (Oral)   Resp 17   Ht 5' 8.5" (1.74 m)   Wt 105.2 kg   SpO2 94%   BMI 34.76 kg/m  Physical Exam Vitals and nursing note reviewed.  Constitutional:  Appearance: He is well-developed.  HENT:     Head: Atraumatic.  Cardiovascular:     Rate and Rhythm: Normal rate.     Heart sounds: Normal heart sounds.  Pulmonary:     Effort: Pulmonary effort is normal.     Breath sounds: No decreased breath sounds, wheezing, rhonchi or rales.  Musculoskeletal:     Cervical back: Neck supple.  Skin:    General: Skin is warm.  Neurological:     Mental Status: He is alert and oriented to person, place, and time.     ED Results / Procedures / Treatments   Labs (all labs ordered are listed, but only abnormal results are displayed) Labs Reviewed  BASIC  METABOLIC PANEL  CBC WITH DIFFERENTIAL/PLATELET    EKG None  Radiology No results found.  Procedures Procedures  {Document cardiac monitor, telemetry assessment procedure when appropriate:1}  Medications Ordered in ED Medications  acetaminophen (TYLENOL) tablet 1,000 mg (has no administration in time range)  ipratropium-albuterol (DUONEB) 0.5-2.5 (3) MG/3ML nebulizer solution 3 mL (has no administration in time range)    ED Course/ Medical Decision Making/ A&P                           Medical Decision Making Amount and/or Complexity of Data Reviewed Labs: ordered. Radiology: ordered.  Risk OTC drugs. Prescription drug management.   This patient presents to the ED with chief complaint(s) of midsternal chest pain, cough and mild shortness of breath with pertinent past medical history of asthma which further complicates the presenting complaint.   The differential diagnosis includes ACS syndrome Myocarditis Pericarditis Endocarditis Pericardial effusion / tamponade Pneumonia Pleural effusion / Pulmonary edema PE Pneumothorax Musculoskeletal pain  Patient's well score 0, he is PERC negative.  I do not think PE work-up is indicated. His chest pain is reproducible with palpation of his chest wall, however ACS is still in the differential.  His hear score is less than 3, we will need single troponin as this chest pain has been present now for over 3 hours.  The initial plan is to get basic labs including troponin, chest x-ray, EKG and reassess.  We will also give patient a DuoNeb to see if there is bronchospasm related to his asthma causing him to have cough.  Low clinical suspicion for pneumonia   Additional history obtained: Additional history obtained from family  Independent labs interpretation:  The following labs were independently interpreted: Normal CBC, metabolic profile and troponin  Independent visualization of imaging: - I independently visualized the  following imaging with scope of interpretation limited to determining acute life threatening conditions related to emergency care: X-ray of the chest, which revealed no evidence of pneumothorax  Treatment and Reassessment: ***  Consultation: - Consulted or discussed management/test interpretation w/ external professional: ***  Consideration for admission or further workup: Social Determinants of health:  Final Clinical Impression(s) / ED Diagnoses Final diagnoses:  None    Rx / DC Orders ED Discharge Orders     None

## 2021-12-15 NOTE — ED Triage Notes (Signed)
Pt with NP cough x 3 days, today mid CP.  Mother states pt with hx of asthma.

## 2021-12-16 LAB — BASIC METABOLIC PANEL
Anion gap: 6 (ref 5–15)
BUN: 7 mg/dL (ref 6–20)
CO2: 23 mmol/L (ref 22–32)
Calcium: 9.2 mg/dL (ref 8.9–10.3)
Chloride: 110 mmol/L (ref 98–111)
Creatinine, Ser: 0.88 mg/dL (ref 0.61–1.24)
GFR, Estimated: >60 mL/min (ref >60)
Glucose, Bld: 95 mg/dL (ref 70–99)
Potassium: 3.7 mmol/L (ref 3.5–5.1)
Sodium: 139 mmol/L (ref 135–145)

## 2021-12-16 LAB — TROPONIN I (HIGH SENSITIVITY): Troponin I (High Sensitivity): <2 ng/L (ref <18)

## 2021-12-16 MED ORDER — PREDNISONE 50 MG PO TABS
60.0000 mg | ORAL_TABLET | Freq: Once | ORAL | Status: AC
  Administered 2021-12-16: 60 mg via ORAL
  Filled 2021-12-16: qty 1

## 2021-12-16 MED ORDER — ALBUTEROL SULFATE HFA 108 (90 BASE) MCG/ACT IN AERS: 2.0000 | INHALATION_SPRAY | RESPIRATORY_TRACT | 2 refills | Status: AC | PRN

## 2021-12-16 MED ORDER — PREDNISONE 20 MG PO TABS: 40.0000 mg | ORAL_TABLET | Freq: Every day | ORAL | 0 refills | Status: DC

## 2021-12-16 MED ORDER — ALBUTEROL SULFATE (2.5 MG/3ML) 0.083% IN NEBU
2.5000 mg | INHALATION_SOLUTION | Freq: Four times a day (QID) | RESPIRATORY_TRACT | 3 refills | Status: AC | PRN
Start: 2021-12-16 — End: ?

## 2021-12-16 NOTE — ED Provider Notes (Signed)
Patient signed out to me to follow-up on work-up.  Patient seen with cough and chest discomfort.  His work-up has been reassuring.  Normal troponin, EKG, chest x-ray and blood work.  He appears well.  Discharged with treatment for asthma/bronchitis.   Orpah Greek, MD 12/16/21 [phone]

## 2021-12-17 ENCOUNTER — Ambulatory Visit: Payer: Medicaid Other | Admitting: Nurse Practitioner

## 2021-12-18 ENCOUNTER — Other Ambulatory Visit: Payer: Self-pay

## 2021-12-18 ENCOUNTER — Encounter (HOSPITAL_COMMUNITY): Payer: Self-pay | Admitting: *Deleted

## 2021-12-18 ENCOUNTER — Emergency Department (HOSPITAL_COMMUNITY)
Admission: EM | Admit: 2021-12-18 | Discharge: 2021-12-18 | Disposition: A | Discharge status: Home / Self Care | Payer: Medicaid Other | Attending: Emergency Medicine | Admitting: Emergency Medicine

## 2021-12-18 DIAGNOSIS — Z7951 Long term (current) use of inhaled steroids: Secondary | ICD-10-CM | POA: Diagnosis not present

## 2021-12-18 DIAGNOSIS — I10 Essential (primary) hypertension: Secondary | ICD-10-CM | POA: Diagnosis not present

## 2021-12-18 DIAGNOSIS — R0602 Shortness of breath: Secondary | ICD-10-CM | POA: Diagnosis present

## 2021-12-18 DIAGNOSIS — J45909 Unspecified asthma, uncomplicated: Secondary | ICD-10-CM | POA: Insufficient documentation

## 2021-12-18 DIAGNOSIS — Z79899 Other long term (current) drug therapy: Secondary | ICD-10-CM | POA: Diagnosis not present

## 2021-12-18 DIAGNOSIS — R0782 Intercostal pain: Secondary | ICD-10-CM | POA: Insufficient documentation

## 2021-12-18 DIAGNOSIS — J40 Bronchitis, not specified as acute or chronic: Secondary | ICD-10-CM | POA: Insufficient documentation

## 2021-12-18 NOTE — Discharge Instructions (Addendum)
As we discussed, the cause of your symptoms today are most likely due to postviral bronchitis as discussed previously.  Please continue treating your symptoms at home. Please begin taking a decongestant for chest congestion that you are describing.  You may also attempt to loosen chest congestion with steam. Please follow-up with your PCP for management of your blood pressure medication.  It seems that you are having a low pulse rate, this is concerning for you.  The medication you are on for blood pressure causes your pulse rate to slow down. Please read the attached guide concerning nonspecific chest pain in adults

## 2021-12-18 NOTE — ED Provider Notes (Signed)
[facility] EMERGENCY DEPARTMENT Provider Note   CSN: [id number] Arrival date & time: 12/18/21  1653     History  Chief Complaint  Patient presents with   Chest Pain    Wayne Avila is a 31 y.o. male with medical history of GERD, hypertension, seizures, ulcerative colitis, bipolar 1 disorder, autoimmune hepatitis, asthma, anxiety, anemia.  Patient presents to ED with mother for complaint of chest pain.  Patient was seen in this ED on 7/10, had unremarkable work-up and was discharged home.  Patient states that his chest pain began 4 days ago and is associated with coughing.  Patient states that coughing makes the chest pain worse.  Patient reports the chest pain is in the middle of his chest, does not radiate.  Patient denies any worsening of chest pain with exertion.  Patient complaining of shortness of breath however denies fevers, nausea, vomiting, diarrhea.  The patient denies any recent travel, immobilization, exogenous hormone use, history of DVT.  The patient is not tachycardic.   Chest Pain Associated symptoms: cough and shortness of breath   Associated symptoms: no fever, no nausea and no vomiting        Home Medications Prior to Admission medications   Medication Sig Start Date End Date Taking? Authorizing Provider  albuterol (PROVENTIL) (2.5 MG/3ML) 0.083% nebulizer solution Inhale 3 mLs (2.5 mg total) into the lungs every 6 (six) hours as needed for wheezing or shortness of breath. 12/16/21   Orpah Greek, MD  albuterol (VENTOLIN HFA) 108 (90 Base) MCG/ACT inhaler Inhale 2 puffs into the lungs every 4 (four) hours as needed for wheezing or shortness of breath. 12/16/21   Orpah Greek, MD  cholecalciferol (VITAMIN D) 1000 units tablet Take 1 tablet (1,000 Units total) by mouth daily. START AFTER VITAMIN D 50,000 U TABLETS ARE COMPLETE Patient taking differently: Take 1,000 Units by mouth daily. 04/08/16   Fields, Marga Melnick, MD  EPINEPHrine 0.3 mg/0.3 mL IJ  SOAJ injection Inject 0.3 mg into the muscle once as needed (for severe allergic reaction). 10/04/20   Noreene Larsson, NP  fluticasone (FLONASE) 50 MCG/ACT nasal spray Place 1 spray into both nostrils daily. Patient taking differently: Place 1 spray into both nostrils daily as needed for allergies. 02/11/18   Rancour, Annie Main, MD  loratadine (CLARITIN) 10 MG tablet Take 1 tablet (10 mg total) by mouth daily. 06/04/21   Noreene Larsson, NP  montelukast (SINGULAIR) 10 MG tablet Take 1 tablet (10 mg total) by mouth at bedtime. 06/04/21   Noreene Larsson, NP  pantoprazole (PROTONIX) 20 MG tablet Take 1 tablet (20 mg total) by mouth daily. 09/17/21   Paseda, Dewaine Conger, FNP  predniSONE (DELTASONE) 20 MG tablet Take 2 tablets (40 mg total) by mouth daily with breakfast. 12/16/21   Pollina, Gwenyth Allegra, MD  propranolol (INDERAL) 10 MG tablet Take 1 tablet (10 mg total) by mouth 3 (three) times daily. 06/04/21   Noreene Larsson, NP  Respiratory Therapy Supplies (NEBULIZER COMPRESSOR) KIT And supplies 04/29/17   Raylene Everts, MD  SUMAtriptan (IMITREX) 100 MG tablet Take 1 tablet earliest onset of headache.  May repeat in 2 hours if headache persists or recurs.  Maximum 2 tablets in 24 hours. 06/04/21   Noreene Larsson, NP  traZODone (DESYREL) 150 MG tablet Take 1 tablet (150 mg total) by mouth at bedtime. 11/28/21   Renee Rival, FNP      Allergies    Nsaids, Other, Pineapple, Remicade [  infliximab], Strawberry extract, Amoxicillin-pot clavulanate, Omeprazole, and Tomato    Review of Systems   Review of Systems  Constitutional:  Negative for fever.  Respiratory:  Positive for cough and shortness of breath.   Cardiovascular:  Positive for chest pain.  Gastrointestinal:  Negative for diarrhea, nausea and vomiting.  All other systems reviewed and are negative.   Physical Exam Updated Vital Signs BP (!) 138/93 (BP Location: Left Arm)   Pulse (!) 58   Temp 98.3 F (36.8 C) (Oral)   Resp 15    SpO2 100%  Physical Exam Vitals and nursing note reviewed.  Constitutional:      General: He is not in acute distress.    Appearance: He is well-developed. He is not ill-appearing, toxic-appearing or diaphoretic.  HENT:     Head: Normocephalic and atraumatic.  Eyes:     Extraocular Movements: Extraocular movements intact.     Pupils: Pupils are equal, round, and reactive to light.  Neck:     Vascular: No JVD.  Cardiovascular:     Rate and Rhythm: Regular rhythm. Bradycardia present.  Pulmonary:     Effort: Pulmonary effort is normal. No tachypnea.     Breath sounds: Normal breath sounds. No decreased breath sounds, wheezing, rhonchi or rales.  Chest:     Chest wall: Tenderness present.  Abdominal:     Palpations: Abdomen is soft.     Tenderness: There is no abdominal tenderness.  Musculoskeletal:     Cervical back: Normal range of motion and neck supple.     Right lower leg: No edema.     Left lower leg: No edema.  Skin:    General: Skin is warm and dry.     Capillary Refill: Capillary refill takes less than 2 seconds.  Neurological:     Mental Status: He is alert and oriented to person, place, and time.     ED Results / Procedures / Treatments   Labs (all labs ordered are listed, but only abnormal results are displayed) Labs Reviewed - No data to display  EKG None  Radiology No results found.  Procedures Procedures    Medications Ordered in ED Medications - No data to display  ED Course/ Medical Decision Making/ A&P                           Medical Decision Making  31 year old now presents back to ED for evaluation.  Please see HPI for further details.  On examination, the patient is afebrile and nontachycardic.  The patient lung sounds are clear bilaterally, he is not hypoxic on room air.  The patient's abdomen is soft and compressible all 4 quadrants.  The patient neurological examination shows no focal neurodeficits.  The patient is nontoxic in  appearance.  Patient recently worked up to include CBC, BMP, troponins, chest x-ray.  All these imaging studies and lab studies were negative for any abnormalities.  The patient's oxygen saturation here today is 100% on room air.  The patient is not tachypneic, his respirations are 15 a minute.  The patient is PERC negative.  Patient counseled that most likely cause of chest pain due to coughing, bronchitis.  The chest pain is reproducible on palpation.  Patient was advised to add decongestant to his regimen of medication that he was given on 7/10 and continue to treat symptoms at home.  I advised the patient that after 10 days his symptoms are most likely resolve.  The patient and his mother were given return precautions and they voiced understanding with these.  The patient and his mother had all their questions answered to their satisfaction.  The patient is stable at this time for discharge home.   Final Clinical Impression(s) / ED Diagnoses Final diagnoses:  Intercostal pain  Bronchitis    Rx / DC Orders ED Discharge Orders     None         Azucena Cecil, PA-C 22/97/98 9211    Campbell Stall P, DO 94/17/40 1512

## 2021-12-18 NOTE — ED Triage Notes (Signed)
Pt in c/o CP onset x 2 days ago, pt seen here for the same on Sunday, pt reports small productive coughing, with yellow sputum, pt denies n/v/d, A&O x4

## 2021-12-23 ENCOUNTER — Ambulatory Visit: Payer: Medicaid Other | Admitting: Neurology

## 2021-12-27 ENCOUNTER — Ambulatory Visit (INDEPENDENT_AMBULATORY_CARE_PROVIDER_SITE_OTHER): Payer: Medicaid Other | Admitting: Nurse Practitioner

## 2021-12-27 ENCOUNTER — Encounter: Payer: Self-pay | Admitting: Nurse Practitioner

## 2021-12-27 VITALS — BP 123/82 | HR 80 | Ht 68.0 in | Wt 235.0 lb

## 2021-12-27 DIAGNOSIS — G40909 Epilepsy, unspecified, not intractable, without status epilepticus: Secondary | ICD-10-CM | POA: Diagnosis not present

## 2021-12-27 DIAGNOSIS — R0782 Intercostal pain: Secondary | ICD-10-CM | POA: Diagnosis not present

## 2021-12-27 DIAGNOSIS — Z9109 Other allergy status, other than to drugs and biological substances: Secondary | ICD-10-CM

## 2021-12-27 DIAGNOSIS — R001 Bradycardia, unspecified: Secondary | ICD-10-CM

## 2021-12-27 DIAGNOSIS — W19XXXA Unspecified fall, initial encounter: Secondary | ICD-10-CM

## 2021-12-27 DIAGNOSIS — I429 Cardiomyopathy, unspecified: Secondary | ICD-10-CM | POA: Insufficient documentation

## 2021-12-27 DIAGNOSIS — R059 Cough, unspecified: Secondary | ICD-10-CM | POA: Insufficient documentation

## 2021-12-27 NOTE — Progress Notes (Unsigned)
Wayne Avila     MRN: [id number]      DOB: [birth year]   HPI Mr. Student with past medical history of GERD, hypertension, history of seizures, bipolar 1 disorder, anxiety is here for follow up for visit to the ER due to concerns with low heart rate .  Patient stated that he has been checking his heart rate at home and he noticed that his heart rate was in the 50s.  He was on propanolol 10 mg 3 times daily, for ADHD  He has since stopped taking propanolol after his visit to the emergency room.  Few days after his visit to the emergency room he fell while taking his bath in the shower.  Patient denies passing out, dizziness, chest pain ,hitting his head during the fall.  He currently denies chest pain, dizziness, syncope.    Patient stated that he has nonproductive cough, nasal discharge since about 3 weeks ago .  He was treated for bronchitis 3 weeks ago .  He stated that his cough is much better but not completely resolved.  Patient denies fever, chills, chest pain, wheezing, shortness of breath .he has been taking over-the-counter cough medication as needed , Zyrtec and Claritin daily, Flonase nasal spray as needed       ROS Denies recent fever or chills. Denies sinus pressure, ear pain or sore throat. Denies chest pains, palpitations and leg swelling Denies abdominal pain, nausea, vomiting,diarrhea or constipation.   Denies dysuria, frequency, hesitancy or incontinence. Denies joint pain, swelling and limitation in mobility. Denies headaches, seizures, numbness, or tingling. Denies depression, anxiety or insomnia. Denies skin break down or rash.   PE  BP 123/82 (BP Location: Right Arm, Patient Position: Sitting, Cuff Size: Large)   Pulse 80   Ht 5' 8"  (1.727 m)   Wt 235 lb (106.6 kg)   SpO2 95%   BMI 35.73 kg/m   Patient alert and oriented and in no cardiopulmonary distress.  HEENT: No facial asymmetry, EOMI,     Neck supple . Nasal turbinates appears moist pink and swollen    Chest: Clear to auscultation bilaterally.  CVS: S1, S2 no murmurs, no S3.Regular rate.  ABD: Soft non tender.   Ext: No edema  MS: Adequate ROM spine, shoulders, hips and knees.  Skin: Intact, no ulcerations or rash noted.  Psych: Good eye contact, normal affect. Memory intact not anxious or depressed appearing.  CNS: CN 2-12 intact, power,  normal throughout.no focal deficits noted.   Assessment & Plan  Seizure disorder [facility]) Patient denies recent seizure activity.  Currently not on medication, has been seizure-free for many years now.   Environmental allergies Lingering cough most likely allergic in nature. Avoid allergens Continue singular 10 mg daily, Zyrtec 10 mg daily Flonase nasal spray 2 spray into each nares daily  Bradycardia Propanolol  has been discontinued. Patient encouraged to monitor heart rate at home.  Go to the emergency room if his heart rate remains consistently below 50s, or if he develops chest pain, dizziness, syncope. He verbalized understanding.  Heart rate is normal in the office today. ED notes , EKG r and labs were reviewed   [place] in the shower while taking bath Denies CP, syncope, dizziness while it happened  Denies hitting his head against any obeject  propanolol dc due to concerns with bradycardia.    Intercostal pain Now resolved Patient encouraged to take Claritin 10 mg daily, montelukast 10 mg daily as ordered for his allergies.  Use  Flonase nasal spray 2 spray into each nares daily. take over-the-counter cough medication as needed.

## 2021-12-27 NOTE — Patient Instructions (Addendum)
Propanolol has been discontinued due to your complaint of low heart rate. Your heart rate is normal in the office today.     Please take over the counter cough medication as needed .   It is important that you exercise regularly at least 30 minutes 5 times a week.  Think about what you will eat, plan ahead. Choose " clean, green, fresh or frozen" over canned, processed or packaged foods which are more sugary, salty and fatty. 70 to 75% of food eaten should be vegetables and fruit. Three meals at set times with snacks allowed between meals, but they must be fruit or vegetables. Aim to eat over a 12 hour period , example 7 am to 7 pm, and STOP after  your last meal of the day. Drink water,generally about 64 ounces per day, no other drink is as healthy. Fruit juice is best enjoyed in a healthy way, by EATING the fruit.  Thanks for choosing [facility], we consider it a privelige to serve you.

## 2021-12-28 ENCOUNTER — Encounter: Payer: Self-pay | Admitting: Nurse Practitioner

## 2021-12-28 DIAGNOSIS — W19XXXA Unspecified fall, initial encounter: Secondary | ICD-10-CM | POA: Insufficient documentation

## 2021-12-28 DIAGNOSIS — R0782 Intercostal pain: Secondary | ICD-10-CM | POA: Insufficient documentation

## 2021-12-28 NOTE — Assessment & Plan Note (Signed)
Fell in the shower while taking bath Denies CP, syncope, dizziness while it happened  Denies hitting his head against any obeject  propanolol dc due to concerns with bradycardia.

## 2021-12-28 NOTE — Assessment & Plan Note (Signed)
Now resolved Patient encouraged to take Claritin 10 mg daily, montelukast 10 mg daily as ordered for his allergies.  Use  Flonase nasal spray 2 spray into each nares daily. take over-the-counter cough medication as needed.

## 2021-12-28 NOTE — Assessment & Plan Note (Signed)
Propanolol  has been discontinued. Patient encouraged to monitor heart rate at home.  Go to the emergency room if his heart rate remains consistently below 50s, or if he develops chest pain, dizziness, syncope. He verbalized understanding.  Heart rate is normal in the office today. ED notes , EKG r and labs were reviewed

## 2021-12-28 NOTE — Assessment & Plan Note (Signed)
Patient denies recent seizure activity.  Currently not on medication, has been seizure-free for many years now.

## 2021-12-28 NOTE — Assessment & Plan Note (Signed)
Lingering cough most likely allergic in nature. Avoid allergens Continue singular 10 mg daily, Zyrtec 10 mg daily Flonase nasal spray 2 spray into each nares daily

## 2022-01-06 NOTE — Progress Notes (Unsigned)
NEUROLOGY FOLLOW UP OFFICE NOTE  Wayne Avila [id number]  Assessment/Plan:     1.  Migraine without aura, without status migrainosus, not intractable 2.  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  Bipolar 1 disorder/mental developmental delay   1.  As migraines are infrequent, defer preventative medication 2.  For rescue:  Sumatriptan 165m with promethazine 264m  Stop Fioricet 3.  Limit use of pain relievers to no more than 2 days out of week to prevent risk of rebound or medication-overuse headache. 4.  Keep headache diary 5. Follow up 6 months.     Subjective:  Wayne Avila a [age]ear old male with ADHD, Bipolar 1 disorder, ulcerative colitis and remote history of seizures (not on medication) who follows up for migraines.    UPDATE: Intensity:  *** Duration:  *** Frequency:  *** Frequency of abortive medication: *** Current NSAIDS/analgesics:  Tylenol Current triptans:  sumatriptan 10056murrent ergotamine:  none Current anti-emetic:  none Current muscle relaxants:  none Current Antihypertensive medications:  Propranolol 66m66mD Current Antidepressant medications:  Trazodone  Current Anticonvulsant medications:  none Current anti-CGRP:  none Current Vitamins/Herbal/Supplements:  D Current Antihistamines/Decongestants:  Flonase, Claritin Other therapy:  none Hormone/birth control:  none  Caffeine:  No coffee.  No soda Alcohol:  1 drink every 2 weeks Smoker:  1 cigarette a day Diet:  1 to 2 glasses of water.  No soda.  Does not skip meals Exercise:  no Depression:  no; Anxiety:  no Other pain:  no Sleep:  Sleeps well  HISTORY:  Onset:  2-3 years ago Location:  Predominantly frontal and temporal bilaterally Quality:  pounding Intensity:  severe Aura:  no Prodrome:  no Associated symptoms:  Nausea, vomiting, photophobia, phonophobia, elevated blood pressure.  He denies associated unilateral numbness or weakness. Duration:  1 to 2 days.  With  Fioricet, 15-20 days Frequency:  1 to 2 times a month Frequency of abortive medication: 2 times a month Triggers:  unsure Relieving factors:  Nothing.   Activity:  Movement aggravates migraine   CT head on 08/27/2017 to evaluate headache personally reviewed and was normal.  He had a seizure in 2015.     Remote history of seizures.  First seizure occurred in February 2014, which occurred in the middle of an argument.  Semiology staring episode, loss of consciousness and convulsions.  MRI of brain with and without contrast on 04/03/2014 personally reviewed was normal.  EEGs were normal.  He was [facility] to [facility] EM[facility] but did not have an event during stay.  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  He was previously on Depakote.  He hasn't had a recurrent spell in years.       Past NSAIDS/analgesics:  Fioricet, ibuprofen, tramadol, maybe an opioid in the ED Past abortive triptans:  none Past abortive ergotamine:  none Past muscle relaxants:  Flexeril Past anti-emetic:  Zofran, Reglan, Phenergan Past antihypertensive medications:  clonidine Past antidepressant medications:  sertraline Past anticonvulsant medications:  Depakote Past anti-CGRP:  none Past vitamins/Herbal/Supplements:  none Past antihistamines/decongestants:  Benadryl, Sudafed Other past therapies:  none    Family history of headache: Mom, aunt  PAST MEDICAL HISTORY: Past Medical History:  Diagnosis Date   ADHD (attention deficit hyperactivity disorder)    Anemia    Anxiety    Asthma    Autoimmune hepatitis (HCC)[place] Bipolar 1 disorder (HCC)    Bipolar 1 disorder,  manic, moderate (HCC) 11/09/2014   C. difficile colitis 03/22/2016   C. difficile diarrhea 03/22/2016   Clostridium difficile colitis    Complication of anesthesia    Depression    Family history of adverse reaction to anesthesia    mother- N/v and headache   GERD (gastroesophageal reflux disease)    Headache     HOH (hard of hearing)    Hypertension    Mental developmental delay    Pneumonia    PONV (postoperative nausea and vomiting)    Pre-diabetes    Seizures ([place])    Ulcerative colitis ([place])     MEDICATIONS: Current Outpatient Medications on File Prior to Visit  Medication Sig Dispense Refill   albuterol (PROVENTIL) (2.5 MG/3ML) 0.083% nebulizer solution Inhale 3 mLs (2.5 mg total) into the lungs every 6 (six) hours as needed for wheezing or shortness of breath. 75 mL 3   albuterol (VENTOLIN HFA) 108 (90 Base) MCG/ACT inhaler Inhale 2 puffs into the lungs every 4 (four) hours as needed for wheezing or shortness of breath. 1 each 2   cholecalciferol (VITAMIN D) 1000 units tablet Take 1 tablet (1,000 Units total) by mouth daily. START AFTER VITAMIN D 50,000 U TABLETS ARE COMPLETE (Patient taking differently: Take 1,000 Units by mouth daily.) 30 tablet 11   EPINEPHrine 0.3 mg/0.3 mL IJ SOAJ injection Inject 0.3 mg into the muscle once as needed (for severe allergic reaction). 1 each 1   fluticasone (FLONASE) 50 MCG/ACT nasal spray Place 1 spray into both nostrils daily. (Patient taking differently: Place 1 spray into both nostrils daily as needed for allergies.) 16 g 0   loratadine (CLARITIN) 10 MG tablet Take 1 tablet (10 mg total) by mouth daily. 90 tablet 1   montelukast (SINGULAIR) 10 MG tablet Take 1 tablet (10 mg total) by mouth at bedtime. 90 tablet 1   pantoprazole (PROTONIX) 20 MG tablet Take 1 tablet (20 mg total) by mouth daily. 90 tablet 0   Respiratory Therapy Supplies (NEBULIZER COMPRESSOR) KIT And supplies 1 each 0   SUMAtriptan (IMITREX) 100 MG tablet Take 1 tablet earliest onset of headache.  May repeat in 2 hours if headache persists or recurs.  Maximum 2 tablets in 24 hours. 10 tablet 5   traZODone (DESYREL) 150 MG tablet Take 1 tablet (150 mg total) by mouth at bedtime. 90 tablet 1   No current facility-administered medications on file prior to visit.     ALLERGIES: Allergies  Allergen Reactions   Nsaids Other (See Comments)    Should not take due to GI condition   Other Other (See Comments)    Should not take 2/2 GI condition Other reaction(s): Other (See Comments) Should not take due to GI condition   Pineapple Swelling and Other (See Comments)    Reaction:  Lip swelling   Remicade [Infliximab] Other (See Comments)    AUTOIMMUNE HEPATITIS   Strawberry Extract Swelling    Reaction:  Lip swelling   Amoxicillin-Pot Clavulanate Nausea And Vomiting and Other (See Comments)    GI intolerance    Omeprazole Nausea And Vomiting   Tomato Rash    FAMILY HISTORY: Family History  Problem Relation Age of Onset   Asthma Mother    Ulcers Mother    Bipolar disorder Mother    ADD / ADHD Father    Ulcerative colitis Maternal Aunt    Cancer Maternal Aunt        Leukemia   Ulcerative colitis Paternal Uncle  Colon polyps Maternal Grandmother    Diabetes Maternal Grandmother    Hypertension Maternal Grandmother    Sleep apnea Maternal Grandmother    Heart disease Maternal Grandmother    Diabetes Maternal Grandfather    Hypertension Maternal Grandfather    Sleep apnea Maternal Grandfather    Heart disease Maternal Grandfather    Cancer Maternal Grandfather        prostate   Colon cancer Neg Hx    Liver disease Neg Hx       Objective:  *** General: No acute distress.  Patient appears ***-groomed.   Head:  Normocephalic/atraumatic Eyes:  Fundi examined but not visualized Neck: supple, no paraspinal tenderness, full range of motion Heart:  Regular rate and rhythm Lungs:  Clear to auscultation bilaterally Back: No paraspinal tenderness Neurological Exam: alert and oriented to person, place, and time.  Speech fluent and not dysarthric, language intact.  CN II-XII intact. Bulk and tone normal, muscle strength 5/5 throughout.  Sensation to light touch intact.  Deep tendon reflexes 2+ throughout, toes downgoing.  Finger to nose  testing intact.  Gait normal, Romberg negative.   Metta Clines, DO  CC: ***

## 2022-01-07 ENCOUNTER — Encounter: Payer: Self-pay | Admitting: Neurology

## 2022-01-07 ENCOUNTER — Ambulatory Visit (INDEPENDENT_AMBULATORY_CARE_PROVIDER_SITE_OTHER): Payer: Medicaid Other | Admitting: Neurology

## 2022-01-07 VITALS — BP 126/86 | HR 73 | Ht 68.5 in | Wt 240.0 lb

## 2022-01-07 DIAGNOSIS — G43009 Migraine without aura, not intractable, without status migrainosus: Secondary | ICD-10-CM

## 2022-01-07 DIAGNOSIS — Z87898 Personal history of other specified conditions: Secondary | ICD-10-CM | POA: Diagnosis not present

## 2022-01-07 DIAGNOSIS — F319 Bipolar disorder, unspecified: Secondary | ICD-10-CM | POA: Diagnosis not present

## 2022-01-10 ENCOUNTER — Ambulatory Visit: Payer: Medicaid Other | Admitting: Podiatry

## 2022-02-03 ENCOUNTER — Other Ambulatory Visit: Payer: Self-pay | Admitting: Nurse Practitioner

## 2022-02-03 ENCOUNTER — Other Ambulatory Visit: Payer: Self-pay | Admitting: Neurology

## 2022-02-03 DIAGNOSIS — K219 Gastro-esophageal reflux disease without esophagitis: Secondary | ICD-10-CM

## 2022-02-05 ENCOUNTER — Ambulatory Visit: Payer: Medicaid Other | Admitting: Podiatry

## 2022-02-25 ENCOUNTER — Encounter: Payer: Self-pay | Admitting: Podiatry

## 2022-02-27 ENCOUNTER — Ambulatory Visit: Payer: Medicaid Other | Admitting: Podiatry

## 2022-04-01 ENCOUNTER — Encounter: Payer: Self-pay | Admitting: Internal Medicine

## 2022-04-01 ENCOUNTER — Ambulatory Visit (INDEPENDENT_AMBULATORY_CARE_PROVIDER_SITE_OTHER): Payer: Medicaid Other | Admitting: Internal Medicine

## 2022-04-01 DIAGNOSIS — J4520 Mild intermittent asthma, uncomplicated: Secondary | ICD-10-CM

## 2022-04-01 DIAGNOSIS — Z2821 Immunization not carried out because of patient refusal: Secondary | ICD-10-CM

## 2022-04-01 DIAGNOSIS — F319 Bipolar disorder, unspecified: Secondary | ICD-10-CM

## 2022-04-01 DIAGNOSIS — E782 Mixed hyperlipidemia: Secondary | ICD-10-CM

## 2022-04-01 DIAGNOSIS — G40909 Epilepsy, unspecified, not intractable, without status epilepticus: Secondary | ICD-10-CM

## 2022-04-01 DIAGNOSIS — E559 Vitamin D deficiency, unspecified: Secondary | ICD-10-CM

## 2022-04-01 NOTE — Patient Instructions (Signed)
Please continue taking medications as prescribed.  You are being referred to Psychiatry.  Please get fasting blood tests done before the next visit.

## 2022-04-01 NOTE — Progress Notes (Signed)
Established Patient Office Visit  Subjective:  Patient ID: Wayne Avila, male    DOB: 09/01/1990  Age: 31 y.o. MRN: [id number]  CC:  Chief Complaint  Patient presents with   Follow-up    Pt needs dmv forms filled out he used to have seizures but doesn't have them anymore also pt was seeing therapist but does not see them anymore     HPI [name] is a 32 y.o. male with past medical history of convulsions, bipolar disorder and migraine who presents for f/u of his chronic medical conditions.  History of convulsions: Followed by neurology.  He has not had any seizure episode without needing any antiseizure medicine for many years.  He and his mother report that he has episodes of convulsions were due to his mood symptoms.  He takes sumatriptan as needed for migraine.  He has history of bipolar disorder 1 according to chart review.  He is currently taking trazodone alone for insomnia.  He has episodes of anger bursts, but he attributes it to provocation from his family members.  Denies any SI or HI currently.  He has brought DMV papers for medical clearance.  As he has not had any episode of seizures recently without needing meds, we will provide paperwork.  He agrees to see psychiatrist for bipolar disorder management.  Past Medical History:  Diagnosis Date   ADHD (attention deficit hyperactivity disorder)    Anemia    Anxiety    Asthma    Autoimmune hepatitis ([place])    Bipolar 1 disorder (HCC)    Bipolar 1 disorder, manic, moderate (HCC) 11/09/2014   C. difficile colitis 03/22/2016   C. difficile diarrhea 03/22/2016   Clostridium difficile colitis    Complication of anesthesia    Depression    Family history of adverse reaction to anesthesia    mother- N/v and headache   GERD (gastroesophageal reflux disease)    Headache    HOH (hard of hearing)    Hypertension    Mental developmental delay    Pneumonia    PONV (postoperative nausea and vomiting)    Pre-diabetes     Seizures ([place])    Ulcerative colitis ([place])     Past Surgical History:  Procedure Laterality Date   ADENOIDECTOMY     BIOPSY N/A 12/07/2012   Procedure: GASTRIC BIOPSIES;  Surgeon: [name], MD;  Location: [facility];  Service: Endoscopy;  Laterality: N/A;   BIOPSY N/A 08/30/2013   Procedure: BIOPSY;  Surgeon: [name], MD;  Location: [facility];  Service: Endoscopy;  Laterality: N/A;  right colon,transverse colon, left colon, rectal biopsies   BIOPSY  11/20/2015   Procedure: BIOPSY;  Surgeon: [name], MD;  [facility];  Service: Endoscopy;;  ileal, right colon biopsy, left colon, rectum   BIOPSY  04/18/2016   Procedure: BIOPSY;  Surgeon: [name], MD;  [facility];  Service: Endoscopy;;  left descending colon biopsies   CHOLECYSTECTOMY N/A 01/29/2021   Procedure: LAPAROSCOPIC CHOLECYSTECTOMY WITH INTRAOPERATIVE CHOLANGIOGRAM AND ICG GYE , POSSIBLE OPEN;  Surgeon: [name], MD;  Location: [facility];  Service: General;  Laterality: N/A;   COLON SURGERY     ileostomy   COLONOSCOPY  11/20/2015   Dr. [name]: Severe erythema, edema, ulcers from the anal verge to 20 cm above the anal verge without mucosal sparing, remaining colon and terminal ileum appeared normal. Biopsies from the rectum revealed fulminant active chronic colitis consistent with  IBD. Pathology from terminal ileum revealed intramucosal lymphoid aggregates. Remaining colon random biopsies with inactive chronic colitis   COLONOSCOPY WITH PROPOFOL N/A 08/30/2013   TZG:YFVCBS mucosa in the terminal ileum/COLITIS/ MILD PROCTITIS. 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   COLONOSCOPY WITH PROPOFOL N/A 11/20/2015   Dr. [name]: chronic inactive pancolitis and active severe ulcerative proctitis    COLOPROCTECTOMY W/ ILEO J POUCH     ESOPHAGOGASTRODUODENOSCOPY  11/20/2015   Dr. [name]:  Normal exam, stomach biopsied and duodenal biopsy.. Benign biopsies.   ESOPHAGOGASTRODUODENOSCOPY (EGD) WITH PROPOFOL N/A 12/07/2012   SLF:The mucosa of the esophagus appeared normal Non-erosive gastritis (inflammation) was found in the gastric antrum; multiple biopsies The duodenal mucosa showed no abnormalities in the bulb and second portion of the duodenum   ESOPHAGOGASTRODUODENOSCOPY (EGD) WITH PROPOFOL N/A 11/20/2015   Dr. [name]: normal with normal biopsies, negative H.pylori    ESOPHAGOGASTRODUODENOSCOPY (EGD) WITH PROPOFOL N/A 04/18/2016   Procedure: ESOPHAGOGASTRODUODENOSCOPY (EGD) WITH PROPOFOL;  Surgeon: [name], MD;  [facility];  Service: Endoscopy;  Laterality: N/A;   FLEXIBLE SIGMOIDOSCOPY N/A 04/18/2016   Procedure: FLEXIBLE SIGMOIDOSCOPY;  Surgeon: [name], MD;  [facility];  Service: Endoscopy;  Laterality: N/A;   LYSIS OF ADHESION N/A 01/29/2021   Procedure: LYSIS OF ADHESION;  Surgeon: [name], MD;  Location: [facility];  Service: General;  Laterality: N/A;   TONSILLECTOMY      Family History  Problem Relation Age of Onset   Asthma Mother    Ulcers Mother    Bipolar disorder Mother    ADD / ADHD Father    Ulcerative colitis Maternal Aunt    Cancer Maternal Aunt        Leukemia   Ulcerative colitis Paternal Uncle    Colon polyps Maternal Grandmother    Diabetes Maternal Grandmother    Hypertension Maternal Grandmother    Sleep apnea Maternal Grandmother    Heart disease Maternal Grandmother    Diabetes Maternal Grandfather    Hypertension Maternal Grandfather    Sleep apnea Maternal Grandfather    Heart disease Maternal Grandfather    Cancer Maternal Grandfather        prostate   Colon cancer Neg Hx    Liver disease Neg Hx     Social History   Socioeconomic History   Marital status: Single    Spouse name: Not on file   Number of children: 1   Years of education: 12th   Highest education level: Not on file   Occupational History    Employer: NOT EMPLOYED    Comment: disabled  Tobacco Use   Smoking status: Former    Packs/day: 0.00    Years: 2.00    Total pack years: 0.00    Types: Cigarettes    Quit date: 10/06/2012    Years since quitting: 9.4   Smokeless tobacco: Never   Tobacco comments:    Never smoked cigarettes  Vaping Use   Vaping Use: Never used  Substance and Sexual Activity   Alcohol use: No    Alcohol/week: 0.0 standard drinks of alcohol    Comment: denies usage   Drug use: Not Currently    Types: Marijuana   Sexual activity: Yes    Birth control/protection: None  Other Topics Concern   Not on file  Social History Narrative   Lives with mother   Cory Roughen and Elenor Legato in  the home.   Caffeine Use: occasionally   Diet: eats all food groups   Water: 6-8 cups daily       Likes to game late and sleep in until noon   [facility]      Does not drive   Wears seat belt    Smoke detectors    No weapons   Right handed   Social Determinants of Health   Financial Resource Strain: Low Risk  (06/12/2020)   Overall Financial Resource Strain (CARDIA)    Difficulty of Paying Living Expenses: Not hard at all  Food Insecurity: No Food Insecurity (06/12/2020)   Hunger Vital Sign    Worried About Running Out of Food in the Last Year: Never true    Ran Out of Food in the Last Year: Never true  Transportation Needs: No Transportation Needs (06/12/2020)   PRAPARE - Hydrologist (Medical): No    Lack of Transportation (Non-Medical): No  Physical Activity: Inactive (06/12/2020)   Exercise Vital Sign    Days of Exercise per Week: 0 days    Minutes of Exercise per Session: 0 min  Stress: Stress Concern Present (06/12/2020)   [place]    Feeling of Stress : To some extent  Social Connections: Moderately Isolated (06/12/2020)   Social Connection and Isolation Panel [NHANES]     Frequency of Communication with Friends and Family: More than three times a week    Frequency of Social Gatherings with Friends and Family: Twice a week    Attends Religious Services: 1 to 4 times per year    Active Member of [employer] or Organizations: No    Attends Archivist Meetings: Never    Marital Status: Separated  Intimate Partner Violence: Not At Risk (06/12/2020)   Humiliation, Afraid, Rape, and Kick questionnaire    Fear of Current or Ex-Partner: No    Emotionally Abused: No    Physically Abused: No    Sexually Abused: No    Outpatient Medications Prior to Visit  Medication Sig Dispense Refill   albuterol (PROVENTIL) (2.5 MG/3ML) 0.083% nebulizer solution Inhale 3 mLs (2.5 mg total) into the lungs every 6 (six) hours as needed for wheezing or shortness of breath. 75 mL 3   albuterol (VENTOLIN HFA) 108 (90 Base) MCG/ACT inhaler Inhale 2 puffs into the lungs every 4 (four) hours as needed for wheezing or shortness of breath. 1 each 2   cholecalciferol (VITAMIN D) 1000 units tablet Take 1 tablet (1,000 Units total) by mouth daily. START AFTER VITAMIN D 50,000 U TABLETS ARE COMPLETE (Patient taking differently: Take 1,000 Units by mouth daily.) 30 tablet 11   EPINEPHrine 0.3 mg/0.3 mL IJ SOAJ injection Inject 0.3 mg into the muscle once as needed (for severe allergic reaction). 1 each 1   fluticasone (FLONASE) 50 MCG/ACT nasal spray Place 1 spray into both nostrils daily. (Patient taking differently: Place 1 spray into both nostrils daily as needed for allergies.) 16 g 0   loratadine (CLARITIN) 10 MG tablet Take 1 tablet (10 mg total) by mouth daily. 90 tablet 1   montelukast (SINGULAIR) 10 MG tablet Take 1 tablet (10 mg total) by mouth at bedtime. 90 tablet 1   pantoprazole (PROTONIX) 20 MG tablet TAKE 1 TABLET(20 MG) BY MOUTH DAILY 90 tablet 0   Respiratory Therapy Supplies (NEBULIZER COMPRESSOR) KIT And supplies 1 each 0   SUMAtriptan (IMITREX) 100  MG tablet Take 1 tablet  earliest onset of headache.  May repeat in 2 hours if headache persists or recurs.  Maximum 2 tablets in 24 hours. 10 tablet 5   traZODone (DESYREL) 150 MG tablet Take 1 tablet (150 mg total) by mouth at bedtime. 90 tablet 1   No facility-administered medications prior to visit.    Allergies  Allergen Reactions   Nsaids Other (See Comments)    Should not take due to GI condition   Other Other (See Comments)    Should not take 2/2 GI condition Other reaction(s): Other (See Comments) Should not take due to GI condition   Pineapple Swelling and Other (See Comments)    Reaction:  Lip swelling   Remicade [Infliximab] Other (See Comments)    AUTOIMMUNE HEPATITIS   Strawberry Extract Swelling    Reaction:  Lip swelling   Amoxicillin-Pot Clavulanate Nausea And Vomiting and Other (See Comments)    GI intolerance    Omeprazole Nausea And Vomiting   Tomato Rash    ROS Review of Systems  Constitutional:  Negative for chills and fever.  HENT:  Negative for congestion and sore throat.   Eyes:  Negative for pain and discharge.  Respiratory:  Negative for cough and shortness of breath.   Cardiovascular:  Negative for chest pain and palpitations.  Gastrointestinal:  Negative for constipation, diarrhea, nausea and vomiting.  Endocrine: Negative for polydipsia and polyuria.  Genitourinary:  Negative for dysuria and hematuria.  Musculoskeletal:  Negative for neck pain and neck stiffness.  Skin:  Negative for rash.  Neurological:  Positive for headaches. Negative for dizziness, weakness and numbness.  Psychiatric/Behavioral:  Negative for agitation and behavioral problems. The patient is nervous/anxious.       Objective:    Physical Exam Vitals reviewed.  Constitutional:      General: He is not in acute distress.    Appearance: He is not diaphoretic.  HENT:     Head: Normocephalic and atraumatic.     Nose: Nose normal.     Mouth/Throat:     Mouth: Mucous membranes are moist.  Eyes:      General: No scleral icterus.    Extraocular Movements: Extraocular movements intact.  Cardiovascular:     Rate and Rhythm: Normal rate and regular rhythm.     Heart sounds: Normal heart sounds. No murmur heard. Pulmonary:     Breath sounds: Normal breath sounds. No wheezing or rales.  Musculoskeletal:     Cervical back: Neck supple. No tenderness.     Right lower leg: No edema.     Left lower leg: No edema.  Skin:    General: Skin is warm.     Findings: No rash.  Neurological:     General: No focal deficit present.     Mental Status: He is alert and oriented to person, place, and time.     Cranial Nerves: No cranial nerve deficit.     Sensory: No sensory deficit.     Motor: No weakness.  Psychiatric:        Mood and Affect: Mood normal.        Behavior: Behavior normal.     There were no vitals taken for this visit. Wt Readings from Last 3 Encounters:  01/07/22 240 lb (108.9 kg)  12/27/21 235 lb (106.6 kg)  12/15/21 232 lb (105.2 kg)    Lab Results  Component Value Date   TSH 2.909 03/24/2016   Lab Results  Component Value Date  WBC 7.1 12/15/2021   HGB 14.6 12/15/2021   HCT 42.8 12/15/2021   MCV 85.9 12/15/2021   PLT 202 12/15/2021   Lab Results  Component Value Date   NA 139 12/15/2021   K 3.7 12/15/2021   CO2 23 12/15/2021   GLUCOSE 95 12/15/2021   BUN 7 12/15/2021   CREATININE 0.88 12/15/2021   BILITOT 0.6 01/29/2021   ALKPHOS 68 01/29/2021   AST 23 01/29/2021   ALT 19 01/29/2021   PROT 7.6 01/29/2021   ALBUMIN 4.3 01/29/2021   CALCIUM 9.2 12/15/2021   ANIONGAP 6 12/15/2021   EGFR 122 11/30/2020   Lab Results  Component Value Date   CHOL 153 05/05/2017   Lab Results  Component Value Date   HDL 37 (L) 05/05/2017   Lab Results  Component Value Date   LDLCALC 90 05/05/2017   Lab Results  Component Value Date   TRIG 164 (H) 05/05/2017   Lab Results  Component Value Date   CHOLHDL 4.1 05/05/2017   Lab Results  Component Value  Date   HGBA1C 5.8 (H) 04/04/2020      Assessment & Plan:   Problem List Items Addressed This Visit       Respiratory   Asthma in adult, mild intermittent, uncomplicated    Well controlled with as needed albuterol On Singulair and Claritin for allergies      Relevant Orders   CBC with Differential/Platelet     Nervous and Auditory   Seizure disorder ([place]) - Primary    Patient denies recent seizure activity.  Currently not on medication, has been seizure-free for many years now. Followed by neurology      Relevant Orders   TSH   CMP14+EGFR   CBC with Differential/Platelet     Other   Bipolar 1 disorder (HCC)    Takes trazodone for insomnia, but as needed most likely as he has not refilled it recently Referred to psychiatry      Relevant Orders   Ambulatory referral to Psychiatry   TSH   CMP14+EGFR   Other Visit Diagnoses     Mixed hyperlipidemia       Relevant Orders   Lipid Profile   Vitamin D deficiency       Relevant Orders   VITAMIN D 25 Hydroxy (Vit-D Deficiency, Fractures)   Refused influenza vaccine           No orders of the defined types were placed in this encounter.   Follow-up: Return in about 3 months (around 07/02/2022) for Annual physical.    Lindell Spar, MD

## 2022-04-01 NOTE — Assessment & Plan Note (Signed)
Takes trazodone for insomnia, but as needed most likely as he has not refilled it recently Referred to psychiatry

## 2022-04-01 NOTE — Assessment & Plan Note (Addendum)
Well controlled with as needed albuterol On Singulair and Claritin for allergies

## 2022-04-01 NOTE — Assessment & Plan Note (Addendum)
Patient denies recent seizure activity.  Currently not on medication, has been seizure-free for many years now. Followed by neurology

## 2022-04-04 ENCOUNTER — Telehealth: Payer: Self-pay

## 2022-04-04 DIAGNOSIS — R519 Headache, unspecified headache type: Principal | ICD-10-CM

## 2022-04-04 NOTE — Telephone Encounter (Signed)
Called patient, will pick up [place] DMV forms

## 2022-04-05 ENCOUNTER — Ambulatory Visit
Admit: 2022-04-05 | Discharge: 2022-04-05 | Disposition: A | Discharge status: Home / Self Care | Payer: PRIVATE HEALTH INSURANCE

## 2022-05-06 ENCOUNTER — Other Ambulatory Visit: Payer: Self-pay | Admitting: Nurse Practitioner

## 2022-05-06 DIAGNOSIS — K219 Gastro-esophageal reflux disease without esophagitis: Secondary | ICD-10-CM

## 2022-05-07 ENCOUNTER — Other Ambulatory Visit: Payer: Self-pay

## 2022-05-07 DIAGNOSIS — Z9109 Other allergy status, other than to drugs and biological substances: Secondary | ICD-10-CM

## 2022-05-07 MED ORDER — LORATADINE 10 MG PO TABS: 10.0000 mg | ORAL_TABLET | Freq: Every day | ORAL | 1 refills | Status: DC

## 2022-05-07 MED ORDER — MONTELUKAST SODIUM 10 MG PO TABS: 10.0000 mg | ORAL_TABLET | Freq: Every day | ORAL | 1 refills | Status: DC

## 2022-06-06 ENCOUNTER — Encounter: Payer: Medicaid Other | Admitting: Nurse Practitioner

## 2022-06-12 ENCOUNTER — Ambulatory Visit (HOSPITAL_COMMUNITY): Payer: Medicaid Other | Admitting: Psychiatry

## 2022-06-30 ENCOUNTER — Ambulatory Visit (INDEPENDENT_AMBULATORY_CARE_PROVIDER_SITE_OTHER): Payer: Medicaid Other | Admitting: Psychiatry

## 2022-06-30 ENCOUNTER — Encounter (HOSPITAL_COMMUNITY): Payer: Self-pay | Admitting: Psychiatry

## 2022-06-30 DIAGNOSIS — Z87898 Personal history of other specified conditions: Secondary | ICD-10-CM

## 2022-06-30 DIAGNOSIS — F6381 Intermittent explosive disorder: Secondary | ICD-10-CM

## 2022-06-30 DIAGNOSIS — F901 Attention-deficit hyperactivity disorder, predominantly hyperactive type: Secondary | ICD-10-CM | POA: Diagnosis not present

## 2022-06-30 DIAGNOSIS — F819 Developmental disorder of scholastic skills, unspecified: Secondary | ICD-10-CM | POA: Diagnosis not present

## 2022-06-30 DIAGNOSIS — F319 Bipolar disorder, unspecified: Secondary | ICD-10-CM

## 2022-06-30 NOTE — Patient Instructions (Addendum)
Due to technical difficulties we had to reschedule the last part of your appointment to Monday.  I will discuss with your primary care provider about potentially using Lamictal.  In the meantime could you please get the labs that he ordered to check your liver function?  I discontinued the trazodone since it does not seem to be doing much at this point.  We may want to consider getting neuropsychiatric testing for Wayne Avila as knowing whether or not he has ADHD versus some developmental disability would be very helpful in navigating his treatment.  Here are some options where he could get that done: Jani Files, NP at [facility] [phone]) or [place] [phone]) or Judithann Sauger at [facility]. [facility] ([facility] of [facility]) [phone] has multiple providers. [facility] [phone] has several providers specializing in ADHD/Bipolar= Jillene Bucks, PhD is expert at Adult ADHD, Darryl Nestle, PhD treats adults with both diagnoses.

## 2022-06-30 NOTE — Progress Notes (Signed)
Psychiatric Initial Adult Assessment  Patient Identification: Wayne Avila MRN:  [id number] Date of Evaluation:  06/30/2022 Referral Source: PCP  Assessment:  Wayne Avila is a 32 y.o. 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  Patient reports significant distress with his child's mother whom he is having to pay child support to.  Over the course of the last year he states that he has been denied access to his child and that his child's mother will call him after he blocks her number on different numbers to harass him.  He also notes that he gets irritable when the various family members that live at home with him confuse him.  He carries a diagnosis of mental developmental delay but his mother denies that he was diagnosed with this.  Of note he would probably benefit from neuropsychiatric testing at this point to get a definitive answer on that as he does use childlike grammar for much of the visit and has to rely on his mother for a lot of the history.  He was diagnosed with ADHD at age 89 and the constellation of this with a seizure disorder does suggest the potential of developmental delay.  I will coordinate with his PCP to determine the nature of his prior autoimmune hepatitis as his last set of LFTs from December 2022 looks normal and he would likely benefit from retrial of Lamictal.  This was a few medications that his mother said seemed to work fairly well.  Notably if he does have ADHD spectrum of illness would also want to avoid stimulant medication given that he has a seizure history.  His trazodone does not appear to be doing anything at present so we will discontinue that today.  He mainly wants help with his irritability  and anger which still medical should help with.  While his history does not have much to suggest bipolar spectrum of illness since he does carry this diagnosis would want to have a mood stabilizing agent on board before trial of any antidepressant medication, which could also help with his anger and irritability.  Due to repeated technical difficulties was not able to do medication reconciliation, review of systems or review in detail a possible plan for change to his other medications so we will meet again on Monday to discuss.  Plan:  # Intermittent explosive disorder rule out bipolar spectrum of illness  insomnia Past medication trials:  Status of problem: New to provider Interventions: -- Discuss with PCP before consideration of Lamictal -- Discontinue trazodone given ineffect  # ADHD rule out mental developmental delay  history of seizures Past medication trials:  Status of problem: New to provider Interventions: -- Consider neuropsychiatric testing  # Cannabis use disorder Past medication trials:  Status of problem: New to provider Interventions: -- Continue to encourage abstinence  # Tobacco use disorder Past medication trials:  Status of problem: New to provider Interventions: -- Continue to encourage abstinence --Tobacco cessation clinic provided  # History of autoimmune hepatitis Past medication trials:  Status of problem: New to provider Interventions: -- PCP has labs pending to recheck  Patient was given contact information for behavioral health clinic and was instructed to call 911 for emergencies.   Subjective:  Chief Complaint:  Chief Complaint  Patient presents with  Anger   Establish Care    History of Present Illness:  Calling with mother. Says his mother needs to talk to someone. Has been having issues with his child's mother. Is trying to get away from them. This woman won't let him see his child. Lost video connection for a time and then his  grandmother was also present in the car with them.  Lives with mother, father, god mother, grandmother, aunt. No pets. Everyone gets along. Likes to fish when it is warm, otherwise stays in the house because he doesn't think it is safe outside. Likes playing his drums. Says he can't sleep. Sometimes goes to bed at 4a or 5a. Likes to keep his phone on because of his church group chat, his baby Wayne Avila likes to call and provoke him. Trouble falling asleep with early awakening. Takes naps during the day. Snores. Denies nightmares. Appetite is good. Says he is losing weight despite eating very consistently, especially at night. Was 270 to 280 and now weighs 220lbs but has been trying to lose weight. Works out but not after meals. He says his concentration is adequate but mother says it isn't. Finds his other main frustration is with his family. Was diagnosed with ADHD when he 32 years old as intermittent explosive disorder. Was followed by psychiatrist/pediatrician. Denies being diagnosed with mental handicap of any kind. His previous psychiatrist discontinued ADHD medication because no school or job functioning. He is on disability since he was 6. He is able to do some work under the table as well as playing for church. Denies SI at present.   Only worry is about his child. Denies panic attacks. Longest period of sleeplessness is 3 days. Felt strong urge to sleep and slept one full day. Denies excess project starting, denies having access to money to excessively spend (most of his disability goes to child support), denies hypersexuality. Has issues with how his baby Wayne Avila uses his child support money. No hallucinations, no paranoia.   Only time he drinks alcohol is his birthday or other special events, like 1 beer. Smokes 4 cigarettes daily. Smokes 1g at night to calm his nerves but also rolls a blunt if he knows he is getting agitated. Started use one week ago. Interested in prescription marijuana.    Past  Psychiatric History:  Diagnoses: ADHD at age 24 but they do not have records, diagnosed with bipolar disorder at 81 or 47 Medication trials: strattera, zoloft, adderall, depakote (liver problems), propranolol, concerta, lamictal (did ok), abilify Previous psychiatrist/therapist: [facility] (stopped ADHD meds since no job or school) Hospitalizations: when he was 86 or 28, aunt found him on a bridge threatening to jump off; he then threatened therapist and was hospitalized. Second time was after an argument with his mother and took a handful of medication and threw it on the floor and says he was confused for an attempt, in December 2022.  Suicide attempts: once at 55 or 22 SIB: none Hx of violence towards others: none Current access to guns: none Hx of abuse: Assess next visit  Previous Psychotropic Medications: Yes   Substance Abuse History in the last 12 months:  Yes.    Past Medical History:  Past Medical History:  Diagnosis Date   ADHD (attention deficit hyperactivity disorder)    Anemia    Anxiety    Asthma    Autoimmune hepatitis ([place])    Bipolar 1 disorder ([place])    Bipolar 1 disorder, manic, moderate ([place]) 11/09/2014   C.  difficile colitis 03/22/2016   C. difficile diarrhea 03/22/2016   Clostridium difficile colitis    Complication of anesthesia    Depression    Family history of adverse reaction to anesthesia    mother- N/v and headache   GERD (gastroesophageal reflux disease)    Headache    HOH (hard of hearing)    Hypertension    Mental developmental delay    Pneumonia    PONV (postoperative nausea and vomiting)    Pre-diabetes    Seizures ([place])    Ulcerative colitis ([place])     Past Surgical History:  Procedure Laterality Date   ADENOIDECTOMY     BIOPSY N/A 12/07/2012   Procedure: GASTRIC BIOPSIES;  Surgeon: [name], MD;  Location: [facility];  Service: Endoscopy;  Laterality: N/A;   BIOPSY N/A 08/30/2013   Procedure: BIOPSY;  Surgeon: [name], MD;   Location: [facility];  Service: Endoscopy;  Laterality: N/A;  right colon,transverse colon, left colon, rectal biopsies   BIOPSY  11/20/2015   Procedure: BIOPSY;  Surgeon: [name], MD;  Location: [facility];  Service: Endoscopy;;  ileal, right colon biopsy, left colon, rectum   BIOPSY  04/18/2016   Procedure: BIOPSY;  Surgeon: [name], MD;  Location: [facility];  Service: Endoscopy;;  left descending colon biopsies   CHOLECYSTECTOMY N/A 01/29/2021   Procedure: LAPAROSCOPIC CHOLECYSTECTOMY WITH INTRAOPERATIVE CHOLANGIOGRAM AND ICG GYE , POSSIBLE OPEN;  Surgeon: [name], MD;  Location: [facility];  Service: General;  Laterality: N/A;   COLON SURGERY     ileostomy   COLONOSCOPY  11/20/2015   Dr. [name]: Severe erythema, edema, ulcers from the anal verge to 20 cm above the anal verge without mucosal sparing, remaining colon and terminal ileum appeared normal. Biopsies from the rectum revealed fulminant active chronic colitis consistent with IBD. Pathology from terminal ileum revealed intramucosal lymphoid aggregates. Remaining colon random biopsies with inactive chronic colitis   COLONOSCOPY WITH PROPOFOL N/A 08/30/2013   [id number] mucosa in the terminal ileum/COLITIS/ MILD PROCTITIS. 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   COLONOSCOPY WITH PROPOFOL N/A 11/20/2015   Dr. [name]: chronic inactive pancolitis and active severe ulcerative proctitis    COLOPROCTECTOMY W/ ILEO J POUCH     ESOPHAGOGASTRODUODENOSCOPY  11/20/2015   Dr. [name]: Normal exam, stomach biopsied and duodenal biopsy.. Benign biopsies.   ESOPHAGOGASTRODUODENOSCOPY (EGD) WITH PROPOFOL N/A 12/07/2012   SLF:The mucosa of the esophagus appeared normal Non-erosive gastritis (inflammation) was found in the gastric antrum; multiple biopsies The duodenal mucosa showed no abnormalities in the bulb and second  portion of the duodenum   ESOPHAGOGASTRODUODENOSCOPY (EGD) WITH PROPOFOL N/A 11/20/2015   Dr. [name]: normal with normal biopsies, negative H.pylori    ESOPHAGOGASTRODUODENOSCOPY (EGD) WITH PROPOFOL N/A 04/18/2016   Procedure: ESOPHAGOGASTRODUODENOSCOPY (EGD) WITH PROPOFOL;  Surgeon: [name], MD;  [facility];  Service: Endoscopy;  Laterality: N/A;   FLEXIBLE SIGMOIDOSCOPY N/A 04/18/2016   Procedure: FLEXIBLE SIGMOIDOSCOPY;  Surgeon: [name], MD;  [facility];  Service: Endoscopy;  Laterality: N/A;   LYSIS OF ADHESION N/A 01/29/2021   Procedure: LYSIS OF ADHESION;  Surgeon: [name], MD;  Location: [facility];  Service: General;  Laterality: N/A;   TONSILLECTOMY      Family Psychiatric History: as below  Family History:  Family History  Problem Relation Age of Onset   Asthma Mother  Ulcers Mother    Bipolar disorder Mother    ADD / ADHD Father    Ulcerative colitis Maternal Aunt    Cancer Maternal Aunt        Leukemia   Ulcerative colitis Paternal Uncle    Colon polyps Maternal Grandmother    Diabetes Maternal Grandmother    Hypertension Maternal Grandmother    Sleep apnea Maternal Grandmother    Heart disease Maternal Grandmother    Diabetes Maternal Grandfather    Hypertension Maternal Grandfather    Sleep apnea Maternal Grandfather    Heart disease Maternal Grandfather    Cancer Maternal Grandfather        prostate   Colon cancer Neg Hx    Liver disease Neg Hx     Social History:   Social History   Socioeconomic History   Marital status: Single    Spouse name: Not on file   Number of children: 1   Years of education: 12th   Highest education level: Not on file  Occupational History    Employer: NOT EMPLOYED    Comment: disabled  Tobacco Use   Smoking status: Every Day    Packs/day: 0.00    Years: 2.00    Total pack years: 0.00    Types: Cigarettes    Last attempt to quit: 10/06/2012    Years since quitting: 9.7    Smokeless tobacco: Never   Tobacco comments:    Smokes 4 cigarettes daily  Vaping Use   Vaping Use: Never used  Substance and Sexual Activity   Alcohol use: Not Currently    Comment: 1 beer on special occasions   Drug use: Yes    Types: Marijuana    Comment: Rolls blunt at night to help him sleep will also use when getting irritable.  Started week of 06/23/22   Sexual activity: Yes    Birth control/protection: None  Other Topics Concern   Not on file  Social History Narrative   Lives with mother   Wayne Avila and Wayne Avila in the home.   Caffeine Use: occasionally   Diet: eats all food groups   Water: 6-8 cups daily       Likes to game late and sleep in until noon   [facility]      Does not drive   Wears seat belt    Smoke detectors    No weapons   Right handed   Social Determinants of Health   Financial Resource Strain: Low Risk  (06/12/2020)   Overall Financial Resource Strain (CARDIA)    Difficulty of Paying Living Expenses: Not hard at all  Food Insecurity: No Food Insecurity (06/12/2020)   Hunger Vital Sign    Worried About Running Out of Food in the Last Year: Never true    Ran Out of Food in the Last Year: Never true  Transportation Needs: No Transportation Needs (06/12/2020)   PRAPARE - Hydrologist (Medical): No    Lack of Transportation (Non-Medical): No  Physical Activity: Inactive (06/12/2020)   Exercise Vital Sign    Days of Exercise per Week: 0 days    Minutes of Exercise per Session: 0 min  Stress: Stress Concern Present (06/12/2020)   [place]    Feeling of Stress : To some extent  Social Connections: Moderately Isolated (06/12/2020)   Social Connection and Isolation Panel [NHANES]    Frequency of Communication with Friends  and Family: More than three times a week    Frequency of Social Gatherings with Friends and Family: Twice a week    Attends  Religious Services: 1 to 4 times per year    Active Member of [employer] or Organizations: No    Attends Archivist Meetings: Never    Marital Status: Separated    Additional Social History: see HPI  Allergies:   Allergies  Allergen Reactions   Nsaids Other (See Comments)    Should not take due to GI condition   Other Other (See Comments)    Should not take 2/2 GI condition Other reaction(s): Other (See Comments) Should not take due to GI condition   Pineapple Swelling and Other (See Comments)    Reaction:  Lip swelling   Remicade [Infliximab] Other (See Comments)    AUTOIMMUNE HEPATITIS   Strawberry Extract Swelling    Reaction:  Lip swelling   Amoxicillin-Pot Clavulanate Nausea And Vomiting and Other (See Comments)    GI intolerance    Omeprazole Nausea And Vomiting   Tomato Rash    Current Medications: Current Outpatient Medications  Medication Sig Dispense Refill   albuterol (PROVENTIL) (2.5 MG/3ML) 0.083% nebulizer solution Inhale 3 mLs (2.5 mg total) into the lungs every 6 (six) hours as needed for wheezing or shortness of breath. 75 mL 3   albuterol (VENTOLIN HFA) 108 (90 Base) MCG/ACT inhaler Inhale 2 puffs into the lungs every 4 (four) hours as needed for wheezing or shortness of breath. 1 each 2   cholecalciferol (VITAMIN D) 1000 units tablet Take 1 tablet (1,000 Units total) by mouth daily. START AFTER VITAMIN D 50,000 U TABLETS ARE COMPLETE (Patient taking differently: Take 1,000 Units by mouth daily.) 30 tablet 11   EPINEPHrine 0.3 mg/0.3 mL IJ SOAJ injection Inject 0.3 mg into the muscle once as needed (for severe allergic reaction). 1 each 1   fluticasone (FLONASE) 50 MCG/ACT nasal spray Place 1 spray into both nostrils daily. (Patient taking differently: Place 1 spray into both nostrils daily as needed for allergies.) 16 g 0   loratadine (CLARITIN) 10 MG tablet Take 1 tablet (10 mg total) by mouth daily. 90 tablet 1   montelukast (SINGULAIR) 10 MG tablet  Take 1 tablet (10 mg total) by mouth at bedtime. 90 tablet 1   pantoprazole (PROTONIX) 20 MG tablet TAKE 1 TABLET(20 MG) BY MOUTH DAILY 90 tablet 0   Respiratory Therapy Supplies (NEBULIZER COMPRESSOR) KIT And supplies 1 each 0   SUMAtriptan (IMITREX) 100 MG tablet Take 1 tablet earliest onset of headache.  May repeat in 2 hours if headache persists or recurs.  Maximum 2 tablets in 24 hours. 10 tablet 5   No current facility-administered medications for this visit.    ROS: Review of Systems: unable to complete due to technical failure of visit  Objective:  Psychiatric Specialty Exam: There were no vitals taken for this visit.There is no height or weight on file to calculate BMI.  General Appearance: Casual, Neat, Well Groomed, and appears stated age  Eye Contact:  Fair  Speech:   at times child like grammar, slight speech impediment, hyperverbal but interruptible  Volume:  Increased  Mood:  Irritable  Affect:  Congruent and irritable. Child like at times  Thought Content: Hallucinations: None and Rumination on child's mother  Suicidal Thoughts:  No  Homicidal Thoughts:  No  Thought Process:  Descriptions of Associations: Tangential  Orientation:  Full (Time, Place, and Person)    Memory:  Immediate;   Poor Recent;   Poor Remote;   Poor  Judgment:  Other:  limited at baseline  Insight:   limited at baseline  Concentration:  Concentration: Poor and Attention Span: Poor  Recall:  Poor  Fund of Knowledge: Poor  Language: Poor  Psychomotor Activity:  Normal  Akathisia:  No  AIMS (if indicated): not done  Assets:  Communication Skills Desire for Improvement Financial Resources/Insurance Housing Leisure Time Physical Health Resilience Social Support Talents/Skills Transportation  ADL's:  Impaired  Cognition: Impaired,  Mild  Sleep:  Poor   PE: General: sits comfortably in view of camera; no acute distress  Pulm: no increased work of breathing on room air  MSK: all  extremity movements appear intact  Neuro: no focal neurological deficits observed  Gait & Station: unable to assess by video    Metabolic Disorder Labs: Lab Results  Component Value Date   HGBA1C 5.8 (H) 04/04/2020   MPG 128 05/05/2017   MPG 117 (H) 08/03/2013   No results found for: "PROLACTIN" Lab Results  Component Value Date   CHOL 153 05/05/2017   TRIG 164 (H) 05/05/2017   HDL 37 (L) 05/05/2017   CHOLHDL 4.1 05/05/2017   LDLCALC 90 05/05/2017   Lab Results  Component Value Date   TSH 2.909 03/24/2016    Therapeutic Level Labs: No results found for: "LITHIUM" No results found for: "CBMZ" Lab Results  Component Value Date   VALPROATE 84 11/10/2014    Screenings:  AIMS    Flowsheet Row [facility]  AIMS Total Score 0      AUDIT    Flowsheet Row [facility]  Alcohol Use Disorder Identification Test Final Score (AUDIT) 0      GAD-7    Flowsheet Row Office Visit from 04/04/2020 in [facility]  Total GAD-7 Score 0      PHQ2-9    Flowsheet Row Office Visit from 06/30/2022 in [facility] [place] Office Visit from 04/01/2022 in [facility] Office Visit from 12/27/2021 in [facility] Office Visit from 11/28/2021 in [facility] Office Visit from 06/04/2021 in [facility]  PHQ-2 Total Score 3 0 0 0 0  PHQ-9 Total Score 10 -- -- -- --      Flowsheet Row Office Visit from 06/30/2022 in [facility] [place] ED from 12/18/2021 in [facility] at [facility] ED from 12/15/2021 in [facility] at [facility]  C-SSRS RISK CATEGORY No Risk No Risk No Risk       Collaboration of Care:  Collaboration of Care: Medication Management AEB as above and Primary Care Provider AEB discussion of prior autoimmune hepatitis  Patient/Guardian was advised Release of Information must be obtained prior to any record release in order to collaborate their care with an outside provider. Patient/Guardian was advised if they have not already done so to contact the registration department to sign all necessary forms in order for [place] to release information regarding their care.   Consent: Patient/Guardian gives verbal consent for treatment and assignment of benefits for services provided during this visit. Patient/Guardian expressed understanding and agreed to proceed.   Televisit via video: I connected with Wayne Avila on 06/30/22 at  3:00 PM EST by a  video enabled telemedicine application and verified that I am speaking with the correct person using two identifiers.  Location: Patient: Wayne Avila, in car Provider: home office   I discussed the limitations of evaluation and management by telemedicine and the availability of in person appointments. The patient expressed understanding and agreed to proceed.  I discussed the assessment and treatment plan with the patient. The patient was provided an opportunity to ask questions and all were answered. The patient agreed with the plan and demonstrated an understanding of the instructions.   The patient was advised to call back or seek an in-person evaluation if the symptoms worsen or if the condition fails to improve as anticipated.  I provided 65 minutes of non-face-to-face time during this encounter.  Jacquelynn Cree, MD 1/22/20244:22 PM

## 2022-07-01 ENCOUNTER — Encounter: Payer: Self-pay | Admitting: Internal Medicine

## 2022-07-01 ENCOUNTER — Ambulatory Visit (INDEPENDENT_AMBULATORY_CARE_PROVIDER_SITE_OTHER): Payer: Medicaid Other | Admitting: Internal Medicine

## 2022-07-01 VITALS — BP 134/84 | HR 76 | Ht 68.5 in | Wt 244.4 lb

## 2022-07-01 DIAGNOSIS — F6381 Intermittent explosive disorder: Secondary | ICD-10-CM

## 2022-07-01 DIAGNOSIS — K51019 Ulcerative (chronic) pancolitis with unspecified complications: Secondary | ICD-10-CM

## 2022-07-01 DIAGNOSIS — F901 Attention-deficit hyperactivity disorder, predominantly hyperactive type: Secondary | ICD-10-CM

## 2022-07-01 DIAGNOSIS — E559 Vitamin D deficiency, unspecified: Secondary | ICD-10-CM

## 2022-07-01 DIAGNOSIS — G40909 Epilepsy, unspecified, not intractable, without status epilepticus: Secondary | ICD-10-CM

## 2022-07-01 DIAGNOSIS — E538 Deficiency of other specified B group vitamins: Secondary | ICD-10-CM

## 2022-07-01 DIAGNOSIS — Z0001 Encounter for general adult medical examination with abnormal findings: Secondary | ICD-10-CM | POA: Diagnosis not present

## 2022-07-01 DIAGNOSIS — Z23 Encounter for immunization: Secondary | ICD-10-CM

## 2022-07-01 DIAGNOSIS — F3162 Bipolar disorder, current episode mixed, moderate: Secondary | ICD-10-CM

## 2022-07-01 DIAGNOSIS — J4520 Mild intermittent asthma, uncomplicated: Secondary | ICD-10-CM

## 2022-07-01 NOTE — Assessment & Plan Note (Signed)
Physical exam as documented. Blood tests ordered.

## 2022-07-01 NOTE — Assessment & Plan Note (Signed)
Well controlled with as needed albuterol On Singulair and Claritin for allergies 

## 2022-07-01 NOTE — Progress Notes (Signed)
Established Patient Office Visit  Subjective:  Patient ID: Wayne Avila, male    DOB: [birth year]  Age: 32 y.o. MRN: [id number]  CC:  Chief Complaint  Patient presents with   Annual Exam    HPI Wayne Avila is a 32 y.o. male with past medical history of Ulcerative colitis s/p colectomy, convulsions, bipolar disorder and migraine who presents for annual physical. His mother is present during the visit.  He has history of bipolar disorder 1 according to chart review.  He was taking trazodone alone for insomnia, which was stopped by Psychiatrist as it was not having any effect.  He has episodes of anger bursts, but he attributes it to provocation from his family members.  Denies any SI or HI currently.  He had psychiatry evaluation via video visit, but was not completed due to technical difficulty.  I had discussion with his psychiatrist about questionable bipolar disorder and ADHD.  History of convulsions: Followed by neurology.  He has not had any seizure episode without needing any antiseizure medicine for many years.  He and his mother report that his episodes of convulsions were due to his mood symptoms.  He takes sumatriptan as needed for migraine.  Start review suggests history of persistent ulcerative colitis, s/p colectomy.  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  He currently denies any melena or hematochezia.  He has varying bowel movement frequency, anywhere from 1 BM per day to 2-3 BM in an hour at times. He has not seen GI 12/2020. Prefers to have a local referral.   Past Medical History:  Diagnosis Date   ADHD (attention deficit hyperactivity disorder)    Anemia    Anxiety    Asthma    Autoimmune hepatitis (HCC)    Bipolar 1 disorder (HCC)    Bipolar 1 disorder, manic, moderate (HCC) 11/09/2014   C. difficile colitis 03/22/2016   C. difficile diarrhea 03/22/2016   Clostridium difficile colitis    Complication of anesthesia     Depression    Family history of adverse reaction to anesthesia    mother- N/v and headache   GERD (gastroesophageal reflux disease)    Headache    HOH (hard of hearing)    Hypertension    Mental developmental delay    Pneumonia    PONV (postoperative nausea and vomiting)    Pre-diabetes    Seizures (HCC)    Ulcerative colitis (HCC)     Past Surgical History:  Procedure Laterality Date   ADENOIDECTOMY     BIOPSY N/A 12/07/2012   Procedure: GASTRIC BIOPSIES;  Surgeon: West Bali, MD;  Location: [facility];  Service: Endoscopy;  Laterality: N/A;   BIOPSY N/A 08/30/2013   Procedure: BIOPSY;  Surgeon: West Bali, MD;  Location: [facility];  Service: Endoscopy;  Laterality: N/A;  right colon,transverse colon, left colon, rectal biopsies   BIOPSY  11/20/2015   Procedure: BIOPSY;  Surgeon: West Bali, MD;  [facility]: [facility];  Service: Endoscopy;;  ileal, right colon biopsy, left colon, rectum   BIOPSY  04/18/2016   Procedure: BIOPSY;  Surgeon: Corbin Ade, MD;  [facility]: [facility];  Service: Endoscopy;;  left descending colon biopsies   CHOLECYSTECTOMY N/A 01/29/2021   Procedure: LAPAROSCOPIC CHOLECYSTECTOMY WITH INTRAOPERATIVE CHOLANGIOGRAM AND ICG GYE , POSSIBLE OPEN;  Surgeon: Gaynelle Adu, MD;  Location: [facility];  Service: General;  Laterality: N/A;   COLON SURGERY     ileostomy  COLONOSCOPY  11/20/2015   Dr. [name]: Severe erythema, edema, ulcers from the anal verge to 20 cm above the anal verge without mucosal sparing, remaining colon and terminal ileum appeared normal. Biopsies from the rectum revealed fulminant active chronic colitis consistent with IBD. Pathology from terminal ileum revealed intramucosal lymphoid aggregates. Remaining colon random biopsies with inactive chronic colitis   COLONOSCOPY WITH PROPOFOL N/A 08/30/2013   ACZ:YSAYTK mucosa in the terminal ileum/COLITIS/ MILD PROCTITIS. 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   COLONOSCOPY WITH PROPOFOL N/A 11/20/2015   Dr. [name]: chronic inactive pancolitis and active severe ulcerative proctitis    COLOPROCTECTOMY W/ ILEO J POUCH     ESOPHAGOGASTRODUODENOSCOPY  11/20/2015   Dr. [name]: Normal exam, stomach biopsied and duodenal biopsy.. Benign biopsies.   ESOPHAGOGASTRODUODENOSCOPY (EGD) WITH PROPOFOL N/A 12/07/2012   SLF:The mucosa of the esophagus appeared normal Non-erosive gastritis (inflammation) was found in the gastric antrum; multiple biopsies The duodenal mucosa showed no abnormalities in the bulb and second portion of the duodenum   ESOPHAGOGASTRODUODENOSCOPY (EGD) WITH PROPOFOL N/A 11/20/2015   Dr. [name]: normal with normal biopsies, negative H.pylori    ESOPHAGOGASTRODUODENOSCOPY (EGD) WITH PROPOFOL N/A 04/18/2016   Procedure: ESOPHAGOGASTRODUODENOSCOPY (EGD) WITH PROPOFOL;  Surgeon: [name], MD;  [facility];  Service: Endoscopy;  Laterality: N/A;   FLEXIBLE SIGMOIDOSCOPY N/A 04/18/2016   Procedure: FLEXIBLE SIGMOIDOSCOPY;  Surgeon: [name], MD;  [facility];  Service: Endoscopy;  Laterality: N/A;   LYSIS OF ADHESION N/A 01/29/2021   Procedure: LYSIS OF ADHESION;  Surgeon: [name], MD;  Location: [facility];  Service: General;  Laterality: N/A;   TONSILLECTOMY      Family History  Problem Relation Age of Onset   Asthma Mother    Ulcers Mother    Bipolar disorder Mother    ADD / ADHD Father    Ulcerative colitis Maternal Aunt    Cancer Maternal Aunt        Leukemia   Ulcerative colitis Paternal Uncle    Colon polyps Maternal Grandmother    Diabetes Maternal Grandmother    Hypertension Maternal Grandmother    Sleep apnea Maternal Grandmother    Heart disease Maternal Grandmother    Diabetes Maternal Grandfather    Hypertension Maternal Grandfather    Sleep apnea Maternal Grandfather    Heart disease Maternal  Grandfather    Cancer Maternal Grandfather        prostate   Colon cancer Neg Hx    Liver disease Neg Hx     Social History   Socioeconomic History   Marital status: Single    Spouse name: Not on file   Number of children: 1   Years of education: 12th   Highest education level: Not on file  Occupational History    Employer: NOT EMPLOYED    Comment: disabled  Tobacco Use   Smoking status: Every Day    Packs/day: 0.00    Years: 2.00    Total pack years: 0.00    Types: Cigarettes    Last attempt to quit: 10/06/2012    Years since quitting: 9.7   Smokeless tobacco: Never   Tobacco comments:    Smokes 4 cigarettes daily  Vaping Use   Vaping Use: Never used  Substance and Sexual Activity   Alcohol use: Not Currently    Comment: 1 beer on special occasions   Drug use:  Yes    Types: Marijuana    Comment: Rolls blunt at night to help him sleep will also use when getting irritable.  Started week of 06/23/22   Sexual activity: Yes    Birth control/protection: None  Other Topics Concern   Not on file  Social History Narrative   Lives with mother   Cory Roughen and Elenor Legato in the home.   Caffeine Use: occasionally   Diet: eats all food groups   Water: 6-8 cups daily       Likes to game late and sleep in until noon   [facility]      Does not drive   Wears seat belt    Smoke detectors    No weapons   Right handed   Social Determinants of Health   Financial Resource Strain: Low Risk  (06/12/2020)   Overall Financial Resource Strain (CARDIA)    Difficulty of Paying Living Expenses: Not hard at all  Food Insecurity: No Food Insecurity (06/12/2020)   Hunger Vital Sign    Worried About Running Out of Food in the Last Year: Never true    Ran Out of Food in the Last Year: Never true  Transportation Needs: No Transportation Needs (06/12/2020)   PRAPARE - Hydrologist (Medical): No    Lack of Transportation (Non-Medical): No   Physical Activity: Inactive (06/12/2020)   Exercise Vital Sign    Days of Exercise per Week: 0 days    Minutes of Exercise per Session: 0 min  Stress: Stress Concern Present (06/12/2020)   [place]    Feeling of Stress : To some extent  Social Connections: Moderately Isolated (06/12/2020)   Social Connection and Isolation Panel [NHANES]    Frequency of Communication with Friends and Family: More than three times a week    Frequency of Social Gatherings with Friends and Family: Twice a week    Attends Religious Services: 1 to 4 times per year    Active Member of [employer] or Organizations: No    Attends Archivist Meetings: Never    Marital Status: Separated  Intimate Partner Violence: Not At Risk (06/12/2020)   Humiliation, Afraid, Rape, and Kick questionnaire    Fear of Current or Ex-Partner: No    Emotionally Abused: No    Physically Abused: No    Sexually Abused: No    Outpatient Medications Prior to Visit  Medication Sig Dispense Refill   albuterol (PROVENTIL) (2.5 MG/3ML) 0.083% nebulizer solution Inhale 3 mLs (2.5 mg total) into the lungs every 6 (six) hours as needed for wheezing or shortness of breath. 75 mL 3   albuterol (VENTOLIN HFA) 108 (90 Base) MCG/ACT inhaler Inhale 2 puffs into the lungs every 4 (four) hours as needed for wheezing or shortness of breath. 1 each 2   cholecalciferol (VITAMIN D) 1000 units tablet Take 1 tablet (1,000 Units total) by mouth daily. START AFTER VITAMIN D 50,000 U TABLETS ARE COMPLETE (Patient taking differently: Take 1,000 Units by mouth daily.) 30 tablet 11   EPINEPHrine 0.3 mg/0.3 mL IJ SOAJ injection Inject 0.3 mg into the muscle once as needed (for severe allergic reaction). 1 each 1   fluticasone (FLONASE) 50 MCG/ACT nasal spray Place 1 spray into both nostrils daily. (Patient taking differently: Place 1 spray into both nostrils daily as needed for allergies.) 16 g 0    loratadine (CLARITIN) 10 MG tablet Take 1  tablet (10 mg total) by mouth daily. 90 tablet 1   montelukast (SINGULAIR) 10 MG tablet Take 1 tablet (10 mg total) by mouth at bedtime. 90 tablet 1   pantoprazole (PROTONIX) 20 MG tablet TAKE 1 TABLET(20 MG) BY MOUTH DAILY 90 tablet 0   Respiratory Therapy Supplies (NEBULIZER COMPRESSOR) KIT And supplies 1 each 0   SUMAtriptan (IMITREX) 100 MG tablet Take 1 tablet earliest onset of headache.  May repeat in 2 hours if headache persists or recurs.  Maximum 2 tablets in 24 hours. 10 tablet 5   No facility-administered medications prior to visit.    Allergies  Allergen Reactions   Nsaids Other (See Comments)    Should not take due to GI condition   Other Other (See Comments)    Should not take 2/2 GI condition Other reaction(s): Other (See Comments) Should not take due to GI condition   Pineapple Swelling and Other (See Comments)    Reaction:  Lip swelling   Remicade [Infliximab] Other (See Comments)    AUTOIMMUNE HEPATITIS   Strawberry Extract Swelling    Reaction:  Lip swelling   Amoxicillin-Pot Clavulanate Nausea And Vomiting and Other (See Comments)    GI intolerance    Omeprazole Nausea And Vomiting   Tomato Rash    ROS Review of Systems  Constitutional:  Negative for chills and fever.  HENT:  Negative for congestion and sore throat.   Eyes:  Negative for pain and discharge.  Respiratory:  Negative for cough and shortness of breath.   Cardiovascular:  Negative for chest pain and palpitations.  Gastrointestinal:  Negative for constipation, diarrhea, nausea and vomiting.  Endocrine: Negative for polydipsia and polyuria.  Genitourinary:  Negative for dysuria and hematuria.  Musculoskeletal:  Negative for neck pain and neck stiffness.  Skin:  Negative for rash.  Neurological:  Positive for headaches. Negative for dizziness, weakness and numbness.  Psychiatric/Behavioral:  Positive for agitation, decreased concentration and sleep  disturbance. The patient is nervous/anxious.       Objective:    Physical Exam Vitals reviewed.  Constitutional:      General: He is not in acute distress.    Appearance: He is not diaphoretic.  HENT:     Head: Normocephalic and atraumatic.     Nose: Nose normal.     Mouth/Throat:     Mouth: Mucous membranes are moist.  Eyes:     General: No scleral icterus.    Extraocular Movements: Extraocular movements intact.  Cardiovascular:     Rate and Rhythm: Normal rate and regular rhythm.     Heart sounds: Normal heart sounds. No murmur heard. Pulmonary:     Breath sounds: Normal breath sounds. No wheezing or rales.  Abdominal:     General: Bowel sounds are normal.     Palpations: Abdomen is soft.     Tenderness: There is no abdominal tenderness.  Musculoskeletal:     Cervical back: Neck supple. No tenderness.     Right lower leg: No edema.     Left lower leg: No edema.  Skin:    General: Skin is warm.     Findings: No rash.  Neurological:     General: No focal deficit present.     Mental Status: He is alert and oriented to person, place, and time.     Cranial Nerves: No cranial nerve deficit.     Sensory: No sensory deficit.     Motor: No weakness.  Psychiatric:  Mood and Affect: Mood is depressed.        Behavior: Behavior is slowed.        Thought Content: Thought content does not include homicidal or suicidal ideation.     BP 134/84 (BP Location: Left Arm, Patient Position: Sitting, Cuff Size: Large)   Pulse 76   Ht 5' 8.5" (1.74 m)   Wt 244 lb 6.4 oz (110.9 kg)   SpO2 98%   BMI 36.62 kg/m  Wt Readings from Last 3 Encounters:  07/01/22 244 lb 6.4 oz (110.9 kg)  01/07/22 240 lb (108.9 kg)  12/27/21 235 lb (106.6 kg)    Lab Results  Component Value Date   TSH 2.909 03/24/2016   Lab Results  Component Value Date   WBC 7.1 12/15/2021   HGB 14.6 12/15/2021   HCT 42.8 12/15/2021   MCV 85.9 12/15/2021   PLT 202 12/15/2021   Lab Results  Component  Value Date   NA 139 12/15/2021   K 3.7 12/15/2021   CO2 23 12/15/2021   GLUCOSE 95 12/15/2021   BUN 7 12/15/2021   CREATININE 0.88 12/15/2021   BILITOT 0.6 01/29/2021   ALKPHOS 68 01/29/2021   AST 23 01/29/2021   ALT 19 01/29/2021   PROT 7.6 01/29/2021   ALBUMIN 4.3 01/29/2021   CALCIUM 9.2 12/15/2021   ANIONGAP 6 12/15/2021   EGFR 122 11/30/2020   Lab Results  Component Value Date   CHOL 153 05/05/2017   Lab Results  Component Value Date   HDL 37 (L) 05/05/2017   Lab Results  Component Value Date   LDLCALC 90 05/05/2017   Lab Results  Component Value Date   TRIG 164 (H) 05/05/2017   Lab Results  Component Value Date   CHOLHDL 4.1 05/05/2017   Lab Results  Component Value Date   HGBA1C 5.8 (H) 04/04/2020      Assessment & Plan:   Problem List Items Addressed This Visit       Respiratory   Asthma in adult, mild intermittent, uncomplicated    Well controlled with as needed albuterol On Singulair and Claritin for allergies        Digestive   Ulcerative colitis with complication [facility])    S/p colectomy Needs local GI referral - provided Currently does not have melena or hematochezia      Relevant Orders   Ambulatory referral to Gastroenterology     Nervous and Auditory   Seizure disorder [facility])    Patient denies recent seizure activity.  Currently not on medication, has been seizure-free for many years now. Followed by neurology        Other   Intermittent explosive disorder (Chronic)    Has had episodes of agitation and anger bursts, but he attributes it to his family members provocation Followed by psychiatry      ADHD    Chart review suggests history of ADHD Currently followed by psychiatry - needs neuropsychiatric evaluation      Relevant Orders   TSH   CMP14+EGFR   CBC with Differential/Platelet   Need for immunization against influenza   Relevant Orders   Flu Vaccine QUAD 43mo+IM (Fluarix, Fluzone & Alfiuria Quad PF) (Completed)    Bipolar disorder ([place])    Questionable history, had discussion with psychiatrist Plan to start Lamictal      Encounter for general adult medical examination with abnormal findings - Primary    Physical exam as documented. Blood tests ordered.      Relevant Orders  CMP14+EGFR   CBC with Differential/Platelet   Other Visit Diagnoses     B12 deficiency       Relevant Orders   B12   Vitamin D deficiency       Relevant Orders   VITAMIN D 25 Hydroxy (Vit-D Deficiency, Fractures)       No orders of the defined types were placed in this encounter.   Follow-up: Return in about 6 months (around 12/30/2022).    Lindell Spar, MD

## 2022-07-01 NOTE — Assessment & Plan Note (Signed)
S/p colectomy Needs local GI referral - provided Currently does not have melena or hematochezia

## 2022-07-01 NOTE — Assessment & Plan Note (Signed)
Patient denies recent seizure activity.  Currently not on medication, has been seizure-free for many years now. Followed by neurology 

## 2022-07-01 NOTE — Patient Instructions (Signed)
Please continue taking medications as prescribed.  Please continue to follow low carb diet and perform moderate exercise/walking at least 150 mins/week. 

## 2022-07-01 NOTE — Assessment & Plan Note (Signed)
Questionable history, had discussion with psychiatrist Plan to start Lamictal

## 2022-07-01 NOTE — Assessment & Plan Note (Signed)
Has had episodes of agitation and anger bursts, but he attributes it to his family members provocation Followed by psychiatry

## 2022-07-01 NOTE — Assessment & Plan Note (Signed)
Chart review suggests history of ADHD Currently followed by psychiatry - needs neuropsychiatric evaluation

## 2022-07-02 ENCOUNTER — Encounter: Payer: Self-pay | Admitting: Gastroenterology

## 2022-07-02 LAB — CBC WITH DIFFERENTIAL/PLATELET
Basophils Absolute: 0 10*3/uL (ref 0.0–0.2)
Basos: 1 %
EOS (ABSOLUTE): 0.1 10*3/uL (ref 0.0–0.4)
Eos: 1 %
Hematocrit: 45.4 % (ref 37.5–51.0)
Hemoglobin: 15.3 g/dL (ref 13.0–17.7)
Immature Grans (Abs): 0 10*3/uL (ref 0.0–0.1)
Immature Granulocytes: 0 %
Lymphocytes Absolute: 1.7 10*3/uL (ref 0.7–3.1)
Lymphs: 27 %
MCH: 28.3 pg (ref 26.6–33.0)
MCHC: 33.7 g/dL (ref 31.5–35.7)
MCV: 84 fL (ref 79–97)
Monocytes Absolute: 0.4 10*3/uL (ref 0.1–0.9)
Monocytes: 7 %
Neutrophils Absolute: 4.2 10*3/uL (ref 1.4–7.0)
Neutrophils: 64 %
Platelets: 214 10*3/uL (ref 150–450)
RBC: 5.4 x10E6/uL (ref 4.14–5.80)
RDW: 13.2 % (ref 11.6–15.4)
WBC: 6.5 10*3/uL (ref 3.4–10.8)

## 2022-07-02 LAB — CMP14+EGFR
ALT: 14 IU/L (ref 0–44)
AST: 17 IU/L (ref 0–40)
Albumin/Globulin Ratio: 1.8 (ref 1.2–2.2)
Albumin: 4.4 g/dL (ref 4.3–5.2)
Alkaline Phosphatase: 67 IU/L (ref 44–121)
BUN/Creatinine Ratio: 8 — ABNORMAL LOW (ref 9–20)
BUN: 7 mg/dL (ref 6–20)
Bilirubin Total: 0.4 mg/dL (ref 0.0–1.2)
CO2: 18 mmol/L — ABNORMAL LOW (ref 20–29)
Calcium: 9.5 mg/dL (ref 8.7–10.2)
Chloride: 109 mmol/L — ABNORMAL HIGH (ref 96–106)
Creatinine, Ser: 0.9 mg/dL (ref 0.76–1.27)
Globulin, Total: 2.5 g/dL (ref 1.5–4.5)
Glucose: 68 mg/dL — ABNORMAL LOW (ref 70–99)
Potassium: 4.1 mmol/L (ref 3.5–5.2)
Sodium: 143 mmol/L (ref 134–144)
Total Protein: 6.9 g/dL (ref 6.0–8.5)
eGFR: 118 mL/min/{1.73_m2} (ref >59)

## 2022-07-02 LAB — TSH: TSH: 0.949 u[IU]/mL (ref 0.450–4.500)

## 2022-07-02 LAB — VITAMIN D 25 HYDROXY (VIT D DEFICIENCY, FRACTURES): Vit D, 25-Hydroxy: 25.9 ng/mL — ABNORMAL LOW (ref 30.0–100.0)

## 2022-07-02 LAB — VITAMIN B12: Vitamin B-12: 375 pg/mL (ref 232–1245)

## 2022-07-07 ENCOUNTER — Encounter (HOSPITAL_COMMUNITY): Payer: Self-pay | Admitting: Psychiatry

## 2022-07-07 ENCOUNTER — Telehealth (INDEPENDENT_AMBULATORY_CARE_PROVIDER_SITE_OTHER): Payer: Medicaid Other | Admitting: Psychiatry

## 2022-07-07 DIAGNOSIS — F6381 Intermittent explosive disorder: Secondary | ICD-10-CM | POA: Diagnosis not present

## 2022-07-07 DIAGNOSIS — G43809 Other migraine, not intractable, without status migrainosus: Secondary | ICD-10-CM

## 2022-07-07 DIAGNOSIS — E559 Vitamin D deficiency, unspecified: Secondary | ICD-10-CM | POA: Insufficient documentation

## 2022-07-07 DIAGNOSIS — F819 Developmental disorder of scholastic skills, unspecified: Secondary | ICD-10-CM

## 2022-07-07 DIAGNOSIS — G40909 Epilepsy, unspecified, not intractable, without status epilepticus: Secondary | ICD-10-CM

## 2022-07-07 MED ORDER — LAMOTRIGINE 25 MG PO TABS: ORAL_TABLET | ORAL | 0 refills | Status: DC

## 2022-07-07 NOTE — Patient Instructions (Signed)
We started Lamictal 25 mg nightly to your regimen today.  After 2 weeks of taking the 25 mg dose you can increase to 2 of the tablets for a total dose of 50 mg nightly.  You will stay on that dose until we see each other again a little before a month elapses.
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
## 2022-07-12 NOTE — Progress Notes (Incomplete)
Referring Provider:*** Primary Care Physician:  Lindell Spar, MD Primary Gastroenterologist:  Dr. Abbey Chatters. Previously Dr. [name], followed with [facility] 2018-2020, saw [facility] July 2022  No chief complaint on file.   HPI:   Wayne Avila is a 32 y.o. male presenting today at the request of Dr. Ihor Dow for ulcerative pancolitis.  He has history of ADHD, Seizure disorder, Developmental delay, Bipolar disorder, hard of hearing, GERD, ulcerative pancolitis with severe refractory disease unresponsive to medical therapy. 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 He is now s/p total colectomy in May 2020 at [facility] , followed by completion proctectomy with J-pouch creation in Sept 8416 which was complicated by a contained bowel perforation managed with percutaneous drains, antibiotics and TPN. He eventually recovered from his surgery and is presenting here today ***   Today:    .   Past Medical History:  Diagnosis Date   ADHD (attention deficit hyperactivity disorder)    Anemia    Anxiety    Asthma    Autoimmune hepatitis ([place])    Autoimmune hepatitis ([place]) 01/02/2017   Bipolar 1 disorder (HCC)    Bipolar 1 disorder, manic, moderate (HCC) 11/09/2014   C. difficile colitis 03/22/2016   C. difficile diarrhea 03/22/2016   Clostridium difficile colitis    Complication of anesthesia    Depression    Family history of adverse reaction to anesthesia    mother- N/v and headache   GERD (gastroesophageal reflux disease)    Headache    HOH (hard of hearing)    Hypertension    Mental developmental delay    Pneumonia    PONV (postoperative nausea and vomiting)    Pre-diabetes    Seizures ([place])    Ulcerative colitis ([place])     Past Surgical History:  Procedure Laterality Date   ADENOIDECTOMY     BIOPSY N/A 12/07/2012   Procedure: GASTRIC BIOPSIES;  Surgeon: [name], MD;  [facility]: [facility];  Service: Endoscopy;  Laterality: N/A;   BIOPSY N/A 08/30/2013    Procedure: BIOPSY;  Surgeon: [name], MD;  Location: [facility];  Service: Endoscopy;  Laterality: N/A;  right colon,transverse colon, left colon, rectal biopsies   BIOPSY  11/20/2015   Procedure: BIOPSY;  Surgeon: [name], MD;  [facility];  Service: Endoscopy;;  ileal, right colon biopsy, left colon, rectum   BIOPSY  04/18/2016   Procedure: BIOPSY;  Surgeon: [name], MD;  [facility];  Service: Endoscopy;;  left descending colon biopsies   CHOLECYSTECTOMY N/A 01/29/2021   Procedure: LAPAROSCOPIC CHOLECYSTECTOMY WITH INTRAOPERATIVE CHOLANGIOGRAM AND ICG GYE , POSSIBLE OPEN;  Surgeon: [name], MD;  Location: [facility];  Service: General;  Laterality: N/A;   COLON SURGERY     ileostomy   COLONOSCOPY  11/20/2015   Dr. [name]: Severe erythema, edema, ulcers from the anal verge to 20 cm above the anal verge without mucosal sparing, remaining colon and terminal ileum appeared normal. Biopsies from the rectum revealed fulminant active chronic colitis consistent with IBD. Pathology from terminal ileum revealed intramucosal lymphoid aggregates. Remaining colon random biopsies with inactive chronic colitis   COLONOSCOPY WITH PROPOFOL N/A 08/30/2013   SAY:[id number] mucosa in the terminal ileum/COLITIS/ MILD PROCTITIS. 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   COLONOSCOPY WITH PROPOFOL N/A 11/20/2015   Dr. [name]: chronic inactive pancolitis and active severe ulcerative proctitis  COLOPROCTECTOMY W/ ILEO J POUCH     ESOPHAGOGASTRODUODENOSCOPY  11/20/2015   Dr. Darrick Penna: Normal exam, stomach biopsied and duodenal biopsy.. Benign biopsies.   ESOPHAGOGASTRODUODENOSCOPY (EGD) WITH PROPOFOL N/A 12/07/2012   SLF:The mucosa of the esophagus appeared normal Non-erosive gastritis (inflammation) was found in the gastric antrum; multiple biopsies The duodenal mucosa  showed no abnormalities in the bulb and second portion of the duodenum   ESOPHAGOGASTRODUODENOSCOPY (EGD) WITH PROPOFOL N/A 11/20/2015   Dr. Darrick Penna: normal with normal biopsies, negative H.pylori    ESOPHAGOGASTRODUODENOSCOPY (EGD) WITH PROPOFOL N/A 04/18/2016   Procedure: ESOPHAGOGASTRODUODENOSCOPY (EGD) WITH PROPOFOL;  Surgeon: Corbin Ade, MD;  [facility];  Service: Endoscopy;  Laterality: N/A;   FLEXIBLE SIGMOIDOSCOPY N/A 04/18/2016   Procedure: FLEXIBLE SIGMOIDOSCOPY;  Surgeon: Corbin Ade, MD;  [facility];  Service: Endoscopy;  Laterality: N/A;   LYSIS OF ADHESION N/A 01/29/2021   Procedure: LYSIS OF ADHESION;  Surgeon: Gaynelle Adu, MD;  [facility];  Service: General;  Laterality: N/A;   TONSILLECTOMY      Current Outpatient Medications  Medication Sig Dispense Refill   albuterol (PROVENTIL) (2.5 MG/3ML) 0.083% nebulizer solution Inhale 3 mLs (2.5 mg total) into the lungs every 6 (six) hours as needed for wheezing or shortness of breath. 75 mL 3   albuterol (VENTOLIN HFA) 108 (90 Base) MCG/ACT inhaler Inhale 2 puffs into the lungs every 4 (four) hours as needed for wheezing or shortness of breath. 1 each 2   cholecalciferol (VITAMIN D) 1000 units tablet Take 1 tablet (1,000 Units total) by mouth daily. START AFTER VITAMIN D 50,000 U TABLETS ARE COMPLETE (Patient taking differently: Take 1,000 Units by mouth daily.) 30 tablet 11   EPINEPHrine 0.3 mg/0.3 mL IJ SOAJ injection Inject 0.3 mg into the muscle once as needed (for severe allergic reaction). 1 each 1   fluticasone (FLONASE) 50 MCG/ACT nasal spray Place 1 spray into both nostrils daily. (Patient taking differently: Place 1 spray into both nostrils daily as needed for allergies.) 16 g 0   lamoTRIgine (LAMICTAL) 25 MG tablet Take 1 tablet by mouth nightly for 2 weeks.  Then increase to 2 tablets by mouth nightly thereafter 42 tablet 0   loratadine (CLARITIN) 10 MG tablet Take 1 tablet (10 mg total)  by mouth daily. 90 tablet 1   montelukast (SINGULAIR) 10 MG tablet Take 1 tablet (10 mg total) by mouth at bedtime. 90 tablet 1   pantoprazole (PROTONIX) 20 MG tablet TAKE 1 TABLET(20 MG) BY MOUTH DAILY 90 tablet 0   Respiratory Therapy Supplies (NEBULIZER COMPRESSOR) KIT And supplies 1 each 0   SUMAtriptan (IMITREX) 100 MG tablet Take 1 tablet earliest onset of headache.  May repeat in 2 hours if headache persists or recurs.  Maximum 2 tablets in 24 hours. 10 tablet 5   No current facility-administered medications for this visit.    Allergies as of 07/14/2022 - Review Complete 07/07/2022  Allergen Reaction Noted   Nsaids Other (See Comments) 01/09/2017   Other Other (See Comments) 01/09/2017   Pineapple Swelling and Other (See Comments) 04/14/2011   Remicade [infliximab] Other (See Comments) 01/02/2017   Strawberry extract Swelling 05/08/2012   Amoxicillin-pot clavulanate Nausea And Vomiting and Other (See Comments) 02/28/2011   Omeprazole Nausea And Vomiting 04/20/2013   Tomato Rash 04/14/2011    Family History  Problem Relation Age of Onset   Asthma Mother    Ulcers Mother    Bipolar disorder Mother    ADD /  ADHD Father    Ulcerative colitis Maternal Aunt    Cancer Maternal Aunt        Leukemia   Ulcerative colitis Paternal Uncle    Colon polyps Maternal Grandmother    Diabetes Maternal Grandmother    Hypertension Maternal Grandmother    Sleep apnea Maternal Grandmother    Heart disease Maternal Grandmother    Diabetes Maternal Grandfather    Hypertension Maternal Grandfather    Sleep apnea Maternal Grandfather    Heart disease Maternal Grandfather    Cancer Maternal Grandfather        prostate   Colon cancer Neg Hx    Liver disease Neg Hx     Social History   Socioeconomic History   Marital status: Single    Spouse name: Not on file   Number of children: 1   Years of education: 12th   Highest education level: Not on file  Occupational History    Employer:  NOT EMPLOYED    Comment: disabled  Tobacco Use   Smoking status: Every Day    Packs/day: 0.00    Years: 2.00    Total pack years: 0.00    Types: Cigarettes    Last attempt to quit: 10/06/2012    Years since quitting: 9.7   Smokeless tobacco: Never   Tobacco comments:    Smokes 4 cigarettes daily  Vaping Use   Vaping Use: Never used  Substance and Sexual Activity   Alcohol use: Not Currently    Comment: 1 beer on special occasions   Drug use: Not Currently    Types: Marijuana    Comment: Stopped after initial psychiatry appointment.  Rolled blunt at night to help him sleep will also use when getting irritable.  Started week of 06/23/22   Sexual activity: Yes    Birth control/protection: None  Other Topics Concern   Not on file  Social History Narrative   Lives with mother   Cory Roughen and Elenor Legato in the home.   Caffeine Use: occasionally   Diet: eats all food groups   Water: 6-8 cups daily       Likes to game late and sleep in until noon   [facility]      Does not drive   Wears seat belt    Smoke detectors    No weapons   Right handed   Social Determinants of Health   Financial Resource Strain: Low Risk  (06/12/2020)   Overall Financial Resource Strain (CARDIA)    Difficulty of Paying Living Expenses: Not hard at all  Food Insecurity: No Food Insecurity (06/12/2020)   Hunger Vital Sign    Worried About Running Out of Food in the Last Year: Never true    Ran Out of Food in the Last Year: Never true  Transportation Needs: No Transportation Needs (06/12/2020)   PRAPARE - Hydrologist (Medical): No    Lack of Transportation (Non-Medical): No  Physical Activity: Inactive (06/12/2020)   Exercise Vital Sign    Days of Exercise per Week: 0 days    Minutes of Exercise per Session: 0 min  Stress: Stress Concern Present (06/12/2020)   [place]    Feeling of Stress  : To some extent  Social Connections: Moderately Isolated (06/12/2020)   Social Connection and Isolation Panel [NHANES]    Frequency of Communication with Friends and Family: More than three times a week  Frequency of Social Gatherings with Friends and Family: Twice a week    Attends Religious Services: 1 to 4 times per year    Active Member of [employer] or Organizations: No    Attends Archivist Meetings: Never    Marital Status: Separated  Intimate Partner Violence: Not At Risk (06/12/2020)   Humiliation, Afraid, Rape, and Kick questionnaire    Fear of Current or Ex-Partner: No    Emotionally Abused: No    Physically Abused: No    Sexually Abused: No    Review of Systems: Gen: Denies any fever, chills, fatigue, weight loss, lack of appetite.  CV: Denies chest pain, heart palpitations, peripheral edema, syncope.  Resp: Denies shortness of breath at rest or with exertion. Denies wheezing or cough.  GI: Denies dysphagia or odynophagia. Denies jaundice, hematemesis, fecal incontinence. GU : Denies urinary burning, urinary frequency, urinary hesitancy MS: Denies joint pain, muscle weakness, cramps, or limitation of movement.  Derm: Denies rash, itching, dry skin Psych: Denies depression, anxiety, memory loss, and confusion Heme: Denies bruising, bleeding, and enlarged lymph nodes.  Physical Exam: There were no vitals taken for this visit. General:   Alert and oriented. Pleasant and cooperative. Well-nourished and well-developed.  Head:  Normocephalic and atraumatic. Eyes:  Without icterus, sclera clear and conjunctiva pink.  Ears:  Normal auditory acuity. Lungs:  Clear to auscultation bilaterally. No wheezes, rales, or rhonchi. No distress.  Heart:  S1, S2 present without murmurs appreciated.  Abdomen:  +BS, soft, non-tender and non-distended. No HSM noted. No guarding or rebound. No masses appreciated.  Rectal:  Deferred  Msk:  Symmetrical without gross deformities. Normal  posture. Extremities:  Without edema. Neurologic:  Alert and  oriented x4;  grossly normal neurologically. Skin:  Intact without significant lesions or rashes. Psych:  Alert and cooperative. Normal mood and affect.    Assessment:     Plan:  ***   Aliene Altes, PA-C [facility] 07/14/2022

## 2022-07-14 ENCOUNTER — Ambulatory Visit: Payer: Medicaid Other | Admitting: Gastroenterology

## 2022-07-15 ENCOUNTER — Encounter: Payer: Medicaid Other | Admitting: Internal Medicine

## 2022-07-22 NOTE — Progress Notes (Deleted)
Referring Provider: Lindell Spar, MD Primary Care Physician:  Lindell Spar, MD Primary Gastroenterologist:  Dr. Abbey Chatters. Previously Dr. [name], followed with [facility] 2018-2020, saw [facility] July 2022   No chief complaint on file.   HPI:   Wayne Avila is a 32 y.o. male presenting today at the request of Dr. Ihor Dow for ulcerative pancolitis.   He has history of ADHD, Seizure disorder, Developmental delay, Bipolar disorder, hard of hearing, GERD, ulcerative pancolitis with severe refractory disease unresponsive to medical therapy. 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 He is now s/p total colectomy in May 2020 at [facility] , followed by completion proctectomy with J-pouch creation in Sept [id number] which was complicated by a contained bowel perforation managed with percutaneous drains, antibiotics and TPN. He eventually recovered from his surgery and is presenting here today ***     Today:     Past Medical History:  Diagnosis Date   ADHD (attention deficit hyperactivity disorder)    Anemia    Anxiety    Asthma    Autoimmune hepatitis ([place])    Autoimmune hepatitis ([place]) 01/02/2017   Bipolar 1 disorder (HCC)    Bipolar 1 disorder, manic, moderate (HCC) 11/09/2014   C. difficile colitis 03/22/2016   C. difficile diarrhea 03/22/2016   Clostridium difficile colitis    Complication of anesthesia    Depression    Family history of adverse reaction to anesthesia    mother- N/v and headache   GERD (gastroesophageal reflux disease)    Headache    HOH (hard of hearing)    Hypertension    Mental developmental delay    Pneumonia    PONV (postoperative nausea and vomiting)    Pre-diabetes    Seizures ([place])    Ulcerative colitis ([place])     Past Surgical History:  Procedure Laterality Date   ADENOIDECTOMY     BIOPSY N/A 12/07/2012   Procedure: GASTRIC BIOPSIES;  Surgeon: [name], MD;  Location: [facility];  Service: Endoscopy;  Laterality: N/A;   BIOPSY N/A  08/30/2013   Procedure: BIOPSY;  Surgeon: [name], MD;  Location: [facility];  Service: Endoscopy;  Laterality: N/A;  right colon,transverse colon, left colon, rectal biopsies   BIOPSY  11/20/2015   Procedure: BIOPSY;  Surgeon: [name], MD;  [facility];  Service: Endoscopy;;  ileal, right colon biopsy, left colon, rectum   BIOPSY  04/18/2016   Procedure: BIOPSY;  Surgeon: [name], MD;  [facility];  Service: Endoscopy;;  left descending colon biopsies   CHOLECYSTECTOMY N/A 01/29/2021   Procedure: LAPAROSCOPIC CHOLECYSTECTOMY WITH INTRAOPERATIVE CHOLANGIOGRAM AND ICG GYE , POSSIBLE OPEN;  Surgeon: [name], MD;  Location: [facility];  Service: General;  Laterality: N/A;   COLON SURGERY     ileostomy   COLONOSCOPY  11/20/2015   Dr. [name]: Severe erythema, edema, ulcers from the anal verge to 20 cm above the anal verge without mucosal sparing, remaining colon and terminal ileum appeared normal. Biopsies from the rectum revealed fulminant active chronic colitis consistent with IBD. Pathology from terminal ileum revealed intramucosal lymphoid aggregates. Remaining colon random biopsies with inactive chronic colitis   COLONOSCOPY WITH PROPOFOL N/A 08/30/2013   [id number] mucosa in the terminal ileum/COLITIS/ MILD PROCTITIS. 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   COLONOSCOPY WITH PROPOFOL N/A 11/20/2015   Dr. [name]: chronic inactive pancolitis and  active severe ulcerative proctitis    COLOPROCTECTOMY W/ ILEO J POUCH     ESOPHAGOGASTRODUODENOSCOPY  11/20/2015   Dr. [name]: Normal exam, stomach biopsied and duodenal biopsy.. Benign biopsies.   ESOPHAGOGASTRODUODENOSCOPY (EGD) WITH PROPOFOL N/A 12/07/2012   SLF:The mucosa of the esophagus appeared normal Non-erosive gastritis (inflammation) was found in the gastric antrum; multiple biopsies The  duodenal mucosa showed no abnormalities in the bulb and second portion of the duodenum   ESOPHAGOGASTRODUODENOSCOPY (EGD) WITH PROPOFOL N/A 11/20/2015   Dr. [name]: normal with normal biopsies, negative H.pylori    ESOPHAGOGASTRODUODENOSCOPY (EGD) WITH PROPOFOL N/A 04/18/2016   Procedure: ESOPHAGOGASTRODUODENOSCOPY (EGD) WITH PROPOFOL;  Surgeon: [name], MD;  [facility];  Service: Endoscopy;  Laterality: N/A;   FLEXIBLE SIGMOIDOSCOPY N/A 04/18/2016   Procedure: FLEXIBLE SIGMOIDOSCOPY;  Surgeon: [name], MD;  [facility];  Service: Endoscopy;  Laterality: N/A;   LYSIS OF ADHESION N/A 01/29/2021   Procedure: LYSIS OF ADHESION;  Surgeon: [name], MD;  [facility]: [facility];  Service: General;  Laterality: N/A;   TONSILLECTOMY      Current Outpatient Medications  Medication Sig Dispense Refill   albuterol (PROVENTIL) (2.5 MG/3ML) 0.083% nebulizer solution Inhale 3 mLs (2.5 mg total) into the lungs every 6 (six) hours as needed for wheezing or shortness of breath. 75 mL 3   albuterol (VENTOLIN HFA) 108 (90 Base) MCG/ACT inhaler Inhale 2 puffs into the lungs every 4 (four) hours as needed for wheezing or shortness of breath. 1 each 2   cholecalciferol (VITAMIN D) 1000 units tablet Take 1 tablet (1,000 Units total) by mouth daily. START AFTER VITAMIN D 50,000 U TABLETS ARE COMPLETE (Patient taking differently: Take 1,000 Units by mouth daily.) 30 tablet 11   EPINEPHrine 0.3 mg/0.3 mL IJ SOAJ injection Inject 0.3 mg into the muscle once as needed (for severe allergic reaction). 1 each 1   fluticasone (FLONASE) 50 MCG/ACT nasal spray Place 1 spray into both nostrils daily. (Patient taking differently: Place 1 spray into both nostrils daily as needed for allergies.) 16 g 0   lamoTRIgine (LAMICTAL) 25 MG tablet Take 1 tablet by mouth nightly for 2 weeks.  Then increase to 2 tablets by mouth nightly thereafter 42 tablet 0   loratadine (CLARITIN) 10 MG tablet Take 1  tablet (10 mg total) by mouth daily. 90 tablet 1   montelukast (SINGULAIR) 10 MG tablet Take 1 tablet (10 mg total) by mouth at bedtime. 90 tablet 1   pantoprazole (PROTONIX) 20 MG tablet TAKE 1 TABLET(20 MG) BY MOUTH DAILY 90 tablet 0   Respiratory Therapy Supplies (NEBULIZER COMPRESSOR) KIT And supplies 1 each 0   SUMAtriptan (IMITREX) 100 MG tablet Take 1 tablet earliest onset of headache.  May repeat in 2 hours if headache persists or recurs.  Maximum 2 tablets in 24 hours. 10 tablet 5   No current facility-administered medications for this visit.    Allergies as of 07/24/2022 - Review Complete 07/07/2022  Allergen Reaction Noted   Nsaids Other (See Comments) 01/09/2017   Other Other (See Comments) 01/09/2017   Pineapple Swelling and Other (See Comments) 04/14/2011   Remicade [infliximab] Other (See Comments) 01/02/2017   Strawberry extract Swelling 05/08/2012   Amoxicillin-pot clavulanate Nausea And Vomiting and Other (See Comments) 02/28/2011   Omeprazole Nausea And Vomiting 04/20/2013   Tomato Rash 04/14/2011    Family History  Problem Relation Age of Onset   Asthma Mother    Ulcers Mother  Bipolar disorder Mother    ADD / ADHD Father    Ulcerative colitis Maternal Aunt    Cancer Maternal Aunt        Leukemia   Ulcerative colitis Paternal Uncle    Colon polyps Maternal Grandmother    Diabetes Maternal Grandmother    Hypertension Maternal Grandmother    Sleep apnea Maternal Grandmother    Heart disease Maternal Grandmother    Diabetes Maternal Grandfather    Hypertension Maternal Grandfather    Sleep apnea Maternal Grandfather    Heart disease Maternal Grandfather    Cancer Maternal Grandfather        prostate   Colon cancer Neg Hx    Liver disease Neg Hx     Social History   Socioeconomic History   Marital status: Single    Spouse name: Not on file   Number of children: 1   Years of education: 12th   Highest education level: Not on file  Occupational  History    Employer: NOT EMPLOYED    Comment: disabled  Tobacco Use   Smoking status: Every Day    Packs/day: 0.00    Years: 2.00    Total pack years: 0.00    Types: Cigarettes    Last attempt to quit: 10/06/2012    Years since quitting: 9.7   Smokeless tobacco: Never   Tobacco comments:    Smokes 4 cigarettes daily  Vaping Use   Vaping Use: Never used  Substance and Sexual Activity   Alcohol use: Not Currently    Comment: 1 beer on special occasions   Drug use: Not Currently    Types: Marijuana    Comment: Stopped after initial psychiatry appointment.  Rolled blunt at night to help him sleep will also use when getting irritable.  Started week of 06/23/22   Sexual activity: Yes    Birth control/protection: None  Other Topics Concern   Not on file  Social History Narrative   Lives with mother   Cory Roughen and Elenor Legato in the home.   Caffeine Use: occasionally   Diet: eats all food groups   Water: 6-8 cups daily       Likes to game late and sleep in until noon   [facility]      Does not drive   Wears seat belt    Smoke detectors    No weapons   Right handed   Social Determinants of Health   Financial Resource Strain: Low Risk  (06/12/2020)   Overall Financial Resource Strain (CARDIA)    Difficulty of Paying Living Expenses: Not hard at all  Food Insecurity: No Food Insecurity (06/12/2020)   Hunger Vital Sign    Worried About Running Out of Food in the Last Year: Never true    Ran Out of Food in the Last Year: Never true  Transportation Needs: No Transportation Needs (06/12/2020)   PRAPARE - Hydrologist (Medical): No    Lack of Transportation (Non-Medical): No  Physical Activity: Inactive (06/12/2020)   Exercise Vital Sign    Days of Exercise per Week: 0 days    Minutes of Exercise per Session: 0 min  Stress: Stress Concern Present (06/12/2020)   [place]     Feeling of Stress : To some extent  Social Connections: Moderately Isolated (06/12/2020)   Social Connection and Isolation Panel [NHANES]    Frequency of Communication with Friends and  Family: More than three times a week    Frequency of Social Gatherings with Friends and Family: Twice a week    Attends Religious Services: 1 to 4 times per year    Active Member of [employer] or Organizations: No    Attends Archivist Meetings: Never    Marital Status: Separated  Intimate Partner Violence: Not At Risk (06/12/2020)   Humiliation, Afraid, Rape, and Kick questionnaire    Fear of Current or Ex-Partner: No    Emotionally Abused: No    Physically Abused: No    Sexually Abused: No    Review of Systems: Gen: Denies any fever, chills, fatigue, weight loss, lack of appetite.  CV: Denies chest pain, heart palpitations, peripheral edema, syncope.  Resp: Denies shortness of breath at rest or with exertion. Denies wheezing or cough.  GI: Denies dysphagia or odynophagia. Denies jaundice, hematemesis, fecal incontinence. GU : Denies urinary burning, urinary frequency, urinary hesitancy MS: Denies joint pain, muscle weakness, cramps, or limitation of movement.  Derm: Denies rash, itching, dry skin Psych: Denies depression, anxiety, memory loss, and confusion Heme: Denies bruising, bleeding, and enlarged lymph nodes.  Physical Exam: There were no vitals taken for this visit. General:   Alert and oriented. Pleasant and cooperative. Well-nourished and well-developed.  Head:  Normocephalic and atraumatic. Eyes:  Without icterus, sclera clear and conjunctiva pink.  Ears:  Normal auditory acuity. Lungs:  Clear to auscultation bilaterally. No wheezes, rales, or rhonchi. No distress.  Heart:  S1, S2 present without murmurs appreciated.  Abdomen:  +BS, soft, non-tender and non-distended. No HSM noted. No guarding or rebound. No masses appreciated.  Rectal:  Deferred  Msk:  Symmetrical without gross  deformities. Normal posture. Extremities:  Without edema. Neurologic:  Alert and  oriented x4;  grossly normal neurologically. Skin:  Intact without significant lesions or rashes. Psych:  Alert and cooperative. Normal mood and affect.    Assessment:     Plan:  ***   Aliene Altes, PA-C [facility] 07/24/2022

## 2022-07-24 ENCOUNTER — Encounter: Payer: Self-pay | Admitting: Internal Medicine

## 2022-07-24 ENCOUNTER — Ambulatory Visit: Payer: Medicaid Other | Admitting: Gastroenterology

## 2022-07-24 ENCOUNTER — Ambulatory Visit: Payer: Medicaid Other | Admitting: Internal Medicine

## 2022-07-24 VITALS — BP 117/75 | HR 74 | Ht 68.5 in | Wt 244.8 lb

## 2022-07-24 DIAGNOSIS — R252 Cramp and spasm: Secondary | ICD-10-CM | POA: Diagnosis not present

## 2022-07-24 DIAGNOSIS — E559 Vitamin D deficiency, unspecified: Secondary | ICD-10-CM | POA: Diagnosis not present

## 2022-07-24 MED ORDER — CYCLOBENZAPRINE HCL 10 MG PO TABS: 10.0000 mg | ORAL_TABLET | Freq: Two times a day (BID) | ORAL | 1 refills | Status: DC | PRN

## 2022-07-24 NOTE — Patient Instructions (Addendum)
Please take Flexeril as needed for leg cramps.  Please take Vitamin D 2000 IU and magnesium 400 mg once daily. You can take tonic water as needed for cramping as well.

## 2022-07-24 NOTE — Progress Notes (Signed)
Acute Office Visit  Subjective:    Patient ID: Wayne Avila, male    DOB: [birth year], 32 y.o.   MRN: [id number]  Chief Complaint  Patient presents with   Leg Pain    Patient is complaining of right leg painful and swelling. Patient would like for his vitamin d rechecked     HPI Patient is in today for complaint of intermittent right leg pain, in the calf area.  He has also noticed right leg swelling.  Denies any recent injury.  He has had tense feeling in the right leg at times and has to hold his leg tight to avoid pain. He thought that he has pain due to low Vitamin D, but taking supplement has not helped.  Denies any numbness or tingling of the LE.  Past Medical History:  Diagnosis Date   ADHD (attention deficit hyperactivity disorder)    Anemia    Anxiety    Asthma    Autoimmune hepatitis ([place])    Autoimmune hepatitis ([place]) 01/02/2017   Bipolar 1 disorder (HCC)    Bipolar 1 disorder, manic, moderate (HCC) 11/09/2014   C. difficile colitis 03/22/2016   C. difficile diarrhea 03/22/2016   Clostridium difficile colitis    Complication of anesthesia    Depression    Family history of adverse reaction to anesthesia    mother- N/v and headache   GERD (gastroesophageal reflux disease)    Headache    HOH (hard of hearing)    Hypertension    Mental developmental delay    Pneumonia    PONV (postoperative nausea and vomiting)    Pre-diabetes    Seizures ([place])    Ulcerative colitis ([place])     Past Surgical History:  Procedure Laterality Date   ADENOIDECTOMY     BIOPSY N/A 12/07/2012   Procedure: GASTRIC BIOPSIES;  Surgeon: [name], MD;  Location: [facility];  Service: Endoscopy;  Laterality: N/A;   BIOPSY N/A 08/30/2013   Procedure: BIOPSY;  Surgeon: [name], MD;  Location: [facility];  Service: Endoscopy;  Laterality: N/A;  right colon,transverse colon, left colon, rectal biopsies   BIOPSY  11/20/2015   Procedure: BIOPSY;  Surgeon: [name], MD;  Location: [facility];  Service: Endoscopy;;  ileal, right colon biopsy, left colon, rectum   BIOPSY  04/18/2016   Procedure: BIOPSY;  Surgeon: [name], MD;  Location: [facility];  Service: Endoscopy;;  left descending colon biopsies   CHOLECYSTECTOMY N/A 01/29/2021   Procedure: LAPAROSCOPIC CHOLECYSTECTOMY WITH INTRAOPERATIVE CHOLANGIOGRAM AND ICG GYE , POSSIBLE OPEN;  Surgeon: [name], MD;  Location: [facility];  Service: General;  Laterality: N/A;   COLON SURGERY     ileostomy   COLONOSCOPY  11/20/2015   Dr. [name]: Severe erythema, edema, ulcers from the anal verge to 20 cm above the anal verge without mucosal sparing, remaining colon and terminal ileum appeared normal. Biopsies from the rectum revealed fulminant active chronic colitis consistent with IBD. Pathology from terminal ileum revealed intramucosal lymphoid aggregates. Remaining colon random biopsies with inactive chronic colitis   COLONOSCOPY WITH PROPOFOL N/A 08/30/2013   [id number] mucosa in the terminal ileum/COLITIS/ MILD PROCTITIS. 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   COLONOSCOPY WITH PROPOFOL N/A 11/20/2015   Dr. [name]: chronic inactive pancolitis and active severe ulcerative proctitis    COLOPROCTECTOMY W/ ILEO J POUCH  ESOPHAGOGASTRODUODENOSCOPY  11/20/2015   Dr. [name]: Normal exam, stomach biopsied and duodenal biopsy.. Benign biopsies.   ESOPHAGOGASTRODUODENOSCOPY (EGD) WITH PROPOFOL N/A 12/07/2012   SLF:The mucosa of the esophagus appeared normal Non-erosive gastritis (inflammation) was found in the gastric antrum; multiple biopsies The duodenal mucosa showed no abnormalities in the bulb and second portion of the duodenum   ESOPHAGOGASTRODUODENOSCOPY (EGD) WITH PROPOFOL N/A 11/20/2015   Dr. [name]: normal with normal biopsies, negative H.pylori    ESOPHAGOGASTRODUODENOSCOPY (EGD) WITH PROPOFOL N/A  04/18/2016   Procedure: ESOPHAGOGASTRODUODENOSCOPY (EGD) WITH PROPOFOL;  Surgeon: [name], MD;  [facility];  Service: Endoscopy;  Laterality: N/A;   FLEXIBLE SIGMOIDOSCOPY N/A 04/18/2016   Procedure: FLEXIBLE SIGMOIDOSCOPY;  Surgeon: [name], MD;  [facility]: [facility];  Service: Endoscopy;  Laterality: N/A;   LYSIS OF ADHESION N/A 01/29/2021   Procedure: LYSIS OF ADHESION;  Surgeon: [name], MD;  Location: [facility];  Service: General;  Laterality: N/A;   TONSILLECTOMY      Family History  Problem Relation Age of Onset   Asthma Mother    Ulcers Mother    Bipolar disorder Mother    ADD / ADHD Father    Ulcerative colitis Maternal Aunt    Cancer Maternal Aunt        Leukemia   Ulcerative colitis Paternal Uncle    Colon polyps Maternal Grandmother    Diabetes Maternal Grandmother    Hypertension Maternal Grandmother    Sleep apnea Maternal Grandmother    Heart disease Maternal Grandmother    Diabetes Maternal Grandfather    Hypertension Maternal Grandfather    Sleep apnea Maternal Grandfather    Heart disease Maternal Grandfather    Cancer Maternal Grandfather        prostate   Colon cancer Neg Hx    Liver disease Neg Hx     Social History   Socioeconomic History   Marital status: Single    Spouse name: Not on file   Number of children: 1   Years of education: 12th   Highest education level: Not on file  Occupational History    Employer: NOT EMPLOYED    Comment: disabled  Tobacco Use   Smoking status: Every Day    Packs/day: 0.00    Years: 2.00    Total pack years: 0.00    Types: Cigarettes    Last attempt to quit: 10/06/2012    Years since quitting: 9.8   Smokeless tobacco: Never   Tobacco comments:    Smokes 4 cigarettes daily  Vaping Use   Vaping Use: Never used  Substance and Sexual Activity   Alcohol use: Not Currently    Comment: 1 beer on special occasions   Drug use: Not Currently    Types: Marijuana    Comment: Stopped  after initial psychiatry appointment.  Rolled blunt at night to help him sleep will also use when getting irritable.  Started week of 06/23/22   Sexual activity: Yes    Birth control/protection: None  Other Topics Concern   Not on file  Social History Narrative   Lives with mother   Cory Roughen and Elenor Legato in the home.   Caffeine Use: occasionally   Diet: eats all food groups   Water: 6-8 cups daily       Likes to game late and sleep in until noon   [facility]      Does not drive   Wears seat belt  Smoke detectors    No weapons   Right handed   Social Determinants of Health   Financial Resource Strain: Low Risk  (06/12/2020)   Overall Financial Resource Strain (CARDIA)    Difficulty of Paying Living Expenses: Not hard at all  Food Insecurity: No Food Insecurity (06/12/2020)   Hunger Vital Sign    Worried About Running Out of Food in the Last Year: Never true    [place] in the Last Year: Never true  Transportation Needs: No Transportation Needs (06/12/2020)   PRAPARE - Hydrologist (Medical): No    Lack of Transportation (Non-Medical): No  Physical Activity: Inactive (06/12/2020)   Exercise Vital Sign    Days of Exercise per Week: 0 days    Minutes of Exercise per Session: 0 min  Stress: Stress Concern Present (06/12/2020)   Will    Feeling of Stress : To some extent  Social Connections: Moderately Isolated (06/12/2020)   Social Connection and Isolation Panel [NHANES]    Frequency of Communication with Friends and Family: More than three times a week    Frequency of Social Gatherings with Friends and Family: Twice a week    Attends Religious Services: 1 to 4 times per year    Active Member of [employer] or Organizations: No    Attends Archivist Meetings: Never    Marital Status: Separated  Intimate Partner Violence: Not At Risk (06/12/2020)    Humiliation, Afraid, Rape, and Kick questionnaire    Fear of Current or Ex-Partner: No    Emotionally Abused: No    Physically Abused: No    Sexually Abused: No    Outpatient Medications Prior to Visit  Medication Sig Dispense Refill   albuterol (PROVENTIL) (2.5 MG/3ML) 0.083% nebulizer solution Inhale 3 mLs (2.5 mg total) into the lungs every 6 (six) hours as needed for wheezing or shortness of breath. 75 mL 3   albuterol (VENTOLIN HFA) 108 (90 Base) MCG/ACT inhaler Inhale 2 puffs into the lungs every 4 (four) hours as needed for wheezing or shortness of breath. 1 each 2   cholecalciferol (VITAMIN D) 1000 units tablet Take 1 tablet (1,000 Units total) by mouth daily. START AFTER VITAMIN D 50,000 U TABLETS ARE COMPLETE (Patient taking differently: Take 1,000 Units by mouth daily.) 30 tablet 11   EPINEPHrine 0.3 mg/0.3 mL IJ SOAJ injection Inject 0.3 mg into the muscle once as needed (for severe allergic reaction). 1 each 1   fluticasone (FLONASE) 50 MCG/ACT nasal spray Place 1 spray into both nostrils daily. (Patient taking differently: Place 1 spray into both nostrils daily as needed for allergies.) 16 g 0   lamoTRIgine (LAMICTAL) 25 MG tablet Take 1 tablet by mouth nightly for 2 weeks.  Then increase to 2 tablets by mouth nightly thereafter 42 tablet 0   loratadine (CLARITIN) 10 MG tablet Take 1 tablet (10 mg total) by mouth daily. 90 tablet 1   montelukast (SINGULAIR) 10 MG tablet Take 1 tablet (10 mg total) by mouth at bedtime. 90 tablet 1   pantoprazole (PROTONIX) 20 MG tablet TAKE 1 TABLET(20 MG) BY MOUTH DAILY 90 tablet 0   Respiratory Therapy Supplies (NEBULIZER COMPRESSOR) KIT And supplies 1 each 0   SUMAtriptan (IMITREX) 100 MG tablet Take 1 tablet earliest onset of headache.  May repeat in 2 hours if headache persists or recurs.  Maximum 2 tablets in 24 hours. 10 tablet 5  No facility-administered medications prior to visit.    Allergies  Allergen Reactions   Nsaids Other (See  Comments)    Should not take due to GI condition   Other Other (See Comments)    Should not take 2/2 GI condition Other reaction(s): Other (See Comments) Should not take due to GI condition   Pineapple Swelling and Other (See Comments)    Reaction:  Lip swelling   Remicade [Infliximab] Other (See Comments)    AUTOIMMUNE HEPATITIS   Strawberry Extract Swelling    Reaction:  Lip swelling   Amoxicillin-Pot Clavulanate Nausea And Vomiting and Other (See Comments)    GI intolerance    Omeprazole Nausea And Vomiting   Tomato Rash    Review of Systems  Constitutional:  Negative for chills and fever.  HENT:  Negative for congestion and sore throat.   Eyes:  Negative for pain and discharge.  Respiratory:  Negative for cough and shortness of breath.   Cardiovascular:  Negative for chest pain and palpitations.  Gastrointestinal:  Negative for constipation, diarrhea, nausea and vomiting.  Endocrine: Negative for polydipsia and polyuria.  Genitourinary:  Negative for dysuria and hematuria.  Musculoskeletal:  Negative for neck pain and neck stiffness.  Skin:  Negative for rash.  Neurological:  Positive for headaches. Negative for dizziness, weakness and numbness.  Psychiatric/Behavioral:  Positive for agitation, decreased concentration and sleep disturbance. The patient is nervous/anxious.        Objective:    Physical Exam Vitals reviewed.  Constitutional:      General: He is not in acute distress.    Appearance: He is not diaphoretic.  HENT:     Head: Normocephalic and atraumatic.     Nose: Nose normal.     Mouth/Throat:     Mouth: Mucous membranes are moist.  Eyes:     General: No scleral icterus.    Extraocular Movements: Extraocular movements intact.  Cardiovascular:     Rate and Rhythm: Normal rate and regular rhythm.     Heart sounds: Normal heart sounds. No murmur heard. Pulmonary:     Breath sounds: Normal breath sounds. No wheezing or rales.  Musculoskeletal:      Cervical back: Neck supple. No tenderness.     Right lower leg: No edema.     Left lower leg: No edema.     Comments: Knee ROM intact Popliteal pulse intact No calf swelling currently  Skin:    General: Skin is warm.     Findings: No rash.  Neurological:     General: No focal deficit present.     Mental Status: He is alert and oriented to person, place, and time.     Cranial Nerves: No cranial nerve deficit.     Sensory: No sensory deficit.     Motor: No weakness.  Psychiatric:        Mood and Affect: Mood is depressed.        Behavior: Behavior is slowed.        Thought Content: Thought content does not include homicidal or suicidal ideation.     BP 117/75 (BP Location: Right Arm, Patient Position: Sitting, Cuff Size: Large)   Pulse 74   Ht 5' 8.5" (1.74 m)   Wt 244 lb 12.8 oz (111 kg)   SpO2 95%   BMI 36.68 kg/m  Wt Readings from Last 3 Encounters:  07/24/22 244 lb 12.8 oz (111 kg)  07/01/22 244 lb 6.4 oz (110.9 kg)  01/07/22 240 lb (108.9  kg)        Assessment & Plan:   Problem List Items Addressed This Visit       Other   Vitamin D deficiency (Chronic)    Last vitamin D Lab Results  Component Value Date   VD25OH 25.9 (L) 07/01/2022  Advised to continue Vitamin D 2000 IU QD Although his symptoms are less likely from Vit D def.      Leg cramps - Primary    He likely has leg cramps Could be due to nutritional deficiency in the setting of UC Advised to take magnesium supplement Hb and MCV wnl - so less likely iron deficiency Flexeril PRN for cramping Advised leg elevation      Relevant Medications   cyclobenzaprine (FLEXERIL) 10 MG tablet     Meds ordered this encounter  Medications   cyclobenzaprine (FLEXERIL) 10 MG tablet    Sig: Take 1 tablet (10 mg total) by mouth 2 (two) times daily as needed for muscle spasms (Leg cramps).    Dispense:  30 tablet    Refill:  1     Kristel Durkee Keith Rake, MD

## 2022-07-24 NOTE — Assessment & Plan Note (Signed)
He likely has leg cramps Could be due to nutritional deficiency in the setting of UC Advised to take magnesium supplement Hb and MCV wnl - so less likely iron deficiency Flexeril PRN for cramping Advised leg elevation

## 2022-07-24 NOTE — Assessment & Plan Note (Signed)
Last vitamin D Lab Results  Component Value Date   VD25OH 25.9 (L) 07/01/2022   Advised to continue Vitamin D 2000 IU QD Although his symptoms are less likely from Vit D def.

## 2022-07-28 ENCOUNTER — Telehealth (INDEPENDENT_AMBULATORY_CARE_PROVIDER_SITE_OTHER): Payer: No Typology Code available for payment source | Admitting: Psychiatry

## 2022-07-28 ENCOUNTER — Encounter (HOSPITAL_COMMUNITY): Payer: Self-pay | Admitting: Psychiatry

## 2022-07-28 DIAGNOSIS — F819 Developmental disorder of scholastic skills, unspecified: Secondary | ICD-10-CM | POA: Diagnosis not present

## 2022-07-28 DIAGNOSIS — Z87891 Personal history of nicotine dependence: Secondary | ICD-10-CM | POA: Diagnosis not present

## 2022-07-28 DIAGNOSIS — F6381 Intermittent explosive disorder: Secondary | ICD-10-CM

## 2022-07-28 MED ORDER — LAMOTRIGINE 100 MG PO TABS: 100.0000 mg | ORAL_TABLET | Freq: Every day | ORAL | 1 refills | Status: DC

## 2022-07-28 MED ORDER — LAMOTRIGINE 25 MG PO TABS: ORAL_TABLET | ORAL | 0 refills | Status: DC

## 2022-07-28 NOTE — Patient Instructions (Signed)
Finish the current bottle of 50 mg of Lamictal once daily.  Once you are done with this bottle you can start the 75 mg  (3 of the 25 mg tablets) once nightly.  After he finished a half bottle then you can pick up the 100 mg once nightly dose of Lamictal.  This will come as 1 tablet.  Do not overlap these medications.  Keep up the good work with staying off cigarettes and marijuana.
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
## 2022-08-19 ENCOUNTER — Ambulatory Visit: Payer: Medicaid Other | Admitting: Gastroenterology

## 2022-08-26 ENCOUNTER — Encounter (HOSPITAL_COMMUNITY): Payer: Self-pay | Admitting: Psychiatry

## 2022-08-26 ENCOUNTER — Telehealth (INDEPENDENT_AMBULATORY_CARE_PROVIDER_SITE_OTHER): Payer: No Typology Code available for payment source | Admitting: Psychiatry

## 2022-08-26 DIAGNOSIS — F819 Developmental disorder of scholastic skills, unspecified: Secondary | ICD-10-CM | POA: Diagnosis not present

## 2022-08-26 DIAGNOSIS — G40909 Epilepsy, unspecified, not intractable, without status epilepticus: Secondary | ICD-10-CM | POA: Diagnosis not present

## 2022-08-26 DIAGNOSIS — E559 Vitamin D deficiency, unspecified: Secondary | ICD-10-CM | POA: Diagnosis not present

## 2022-08-26 DIAGNOSIS — F6381 Intermittent explosive disorder: Secondary | ICD-10-CM

## 2022-08-26 DIAGNOSIS — F901 Attention-deficit hyperactivity disorder, predominantly hyperactive type: Secondary | ICD-10-CM

## 2022-08-26 MED ORDER — LAMOTRIGINE 150 MG PO TABS: 150.0000 mg | ORAL_TABLET | Freq: Every day | ORAL | 1 refills | Status: DC

## 2022-08-26 MED ORDER — LAMOTRIGINE 150 MG PO TABS
150.0000 mg | ORAL_TABLET | Freq: Every day | ORAL | 1 refills | Status: DC
Start: 2022-08-26 — End: 2022-08-26
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
## 2022-08-26 NOTE — Patient Instructions (Signed)
We will plan on increasing the Lamictal to 150 mg nightly after he has had the full 2 weeks of the 100 mg dose.  This should happen on 09/01/2022 so until that date please continue to take the 100 mg dose.  I also recommend consulting with Wayne Avila's neurologist to rule out any seizure changes for him.

## 2022-08-27 NOTE — Progress Notes (Deleted)
GI Office Note    Referring Provider: Lindell Spar, MD Primary Care Physician:  Lindell Spar, MD  Primary Gastroenterologist: ***, previously Dr. [name]  Chief Complaint   No chief complaint on file.   History of Present Illness   Wayne Avila is a 32 y.o. male presenting today at the request of Lindell Spar, MD for ulcerative pancolitis. History of ADHD, Seizure disorder, Developmental delay, Bipolar disorder, hard of hearing, GERD, ulcerative pancolitis with severe refractory disease unresponsive to medical therapy.   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 He eventually recovered from his surgery.   Labs 07/01/22: Vitamin D 25.9, TSH 0.949, B12 375, Hgb 15.3, plts 214, CMP unremarkable other than mildly low glucose.   Today:  Total colectomy?   Current Outpatient Medications  Medication Sig Dispense Refill   albuterol (PROVENTIL) (2.5 MG/3ML) 0.083% nebulizer solution Inhale 3 mLs (2.5 mg total) into the lungs every 6 (six) hours as needed for wheezing or shortness of breath. 75 mL 3   albuterol (VENTOLIN HFA) 108 (90 Base) MCG/ACT inhaler Inhale 2 puffs into the lungs every 4 (four) hours as needed for wheezing or shortness of breath. 1 each 2   cholecalciferol (VITAMIN D) 1000 units tablet Take 1 tablet (1,000 Units total) by mouth daily. START AFTER VITAMIN D 50,000 U TABLETS ARE COMPLETE (Patient taking differently: Take 1,000 Units by mouth daily.) 30 tablet 11   cyclobenzaprine (FLEXERIL) 10 MG tablet Take 1 tablet (10 mg total) by mouth 2 (two) times daily as needed for muscle spasms (Leg cramps). 30 tablet 1   EPINEPHrine 0.3 mg/0.3 mL IJ SOAJ injection Inject 0.3 mg into the muscle once as needed (for severe  allergic reaction). 1 each 1   fluticasone (FLONASE) 50 MCG/ACT nasal spray Place 1 spray into both nostrils daily. (Patient taking differently: Place 1 spray into both nostrils daily as needed for allergies.) 16 g 0   [START ON 09/01/2022] lamoTRIgine (LAMICTAL) 150 MG tablet Take 1 tablet (150 mg total) by mouth daily. 30 tablet 1   loratadine (CLARITIN) 10 MG tablet Take 1 tablet (10 mg total) by mouth daily. 90 tablet 1   montelukast (SINGULAIR) 10 MG tablet Take 1 tablet (10 mg total) by mouth at bedtime. 90 tablet 1   pantoprazole (PROTONIX) 20 MG tablet TAKE 1 TABLET(20 MG) BY MOUTH DAILY 90 tablet 0   Respiratory Therapy Supplies (NEBULIZER COMPRESSOR) KIT And supplies 1 each 0   SUMAtriptan (IMITREX) 100 MG tablet Take 1 tablet earliest onset of headache.  May repeat in 2 hours if headache persists or recurs.  Maximum 2 tablets in 24 hours. 10 tablet 5   No current facility-administered medications for this visit.    Past Medical History:  Diagnosis Date   ADHD (attention deficit hyperactivity disorder)    Anemia    Anxiety    Asthma    Autoimmune hepatitis ([place])    Autoimmune hepatitis ([place]) 01/02/2017   Bipolar 1 disorder (HCC)    Bipolar 1 disorder, manic, moderate (HCC) 11/09/2014   C. difficile colitis 03/22/2016   C. difficile diarrhea 03/22/2016   Clostridium difficile colitis    Complication of anesthesia    Depression    Family history of adverse reaction to anesthesia  mother- N/v and headache   GERD (gastroesophageal reflux disease)    Headache    HOH (hard of hearing)    Hypertension    Mental developmental delay    Pneumonia    PONV (postoperative nausea and vomiting)    Pre-diabetes    Seizures (HCC)    Ulcerative colitis ([place])     Past Surgical History:  Procedure Laterality Date   ADENOIDECTOMY     BIOPSY N/A 12/07/2012   Procedure: GASTRIC BIOPSIES;  Surgeon: [name], MD;  Location: [facility];  Service: Endoscopy;  Laterality: N/A;   BIOPSY  N/A 08/30/2013   Procedure: BIOPSY;  Surgeon: [name], MD;  Location: [facility];  Service: Endoscopy;  Laterality: N/A;  right colon,transverse colon, left colon, rectal biopsies   BIOPSY  11/20/2015   Procedure: BIOPSY;  Surgeon: [name], MD;  Location: [facility];  Service: Endoscopy;;  ileal, right colon biopsy, left colon, rectum   BIOPSY  04/18/2016   Procedure: BIOPSY;  Surgeon: [name], MD;  Location: [facility];  Service: Endoscopy;;  left descending colon biopsies   CHOLECYSTECTOMY N/A 01/29/2021   Procedure: LAPAROSCOPIC CHOLECYSTECTOMY WITH INTRAOPERATIVE CHOLANGIOGRAM AND ICG GYE , POSSIBLE OPEN;  Surgeon: [name], MD;  Location: [facility];  Service: General;  Laterality: N/A;   COLON SURGERY     ileostomy   COLONOSCOPY  11/20/2015   Dr. [name]: Severe erythema, edema, ulcers from the anal verge to 20 cm above the anal verge without mucosal sparing, remaining colon and terminal ileum appeared normal. Biopsies from the rectum revealed fulminant active chronic colitis consistent with IBD. Pathology from terminal ileum revealed intramucosal lymphoid aggregates. Remaining colon random biopsies with inactive chronic colitis   COLONOSCOPY WITH PROPOFOL N/A 08/30/2013   [id number] mucosa in the terminal ileum/COLITIS/ MILD PROCTITIS. 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   COLONOSCOPY WITH PROPOFOL N/A 11/20/2015   Dr. [name]: chronic inactive pancolitis and active severe ulcerative proctitis    COLOPROCTECTOMY W/ ILEO J POUCH     ESOPHAGOGASTRODUODENOSCOPY  11/20/2015   Dr. [name]: Normal exam, stomach biopsied and duodenal biopsy.. Benign biopsies.   ESOPHAGOGASTRODUODENOSCOPY (EGD) WITH PROPOFOL N/A 12/07/2012   SLF:The mucosa of the esophagus appeared normal Non-erosive gastritis (inflammation) was found in the gastric antrum; multiple biopsies The  duodenal mucosa showed no abnormalities in the bulb and second portion of the duodenum   ESOPHAGOGASTRODUODENOSCOPY (EGD) WITH PROPOFOL N/A 11/20/2015   Dr. [name]: normal with normal biopsies, negative H.pylori    ESOPHAGOGASTRODUODENOSCOPY (EGD) WITH PROPOFOL N/A 04/18/2016   Procedure: ESOPHAGOGASTRODUODENOSCOPY (EGD) WITH PROPOFOL;  Surgeon: [name], MD;  [facility]: [facility];  Service: Endoscopy;  Laterality: N/A;   FLEXIBLE SIGMOIDOSCOPY N/A 04/18/2016   Procedure: FLEXIBLE SIGMOIDOSCOPY;  Surgeon: [name], MD;  [facility];  Service: Endoscopy;  Laterality: N/A;   LYSIS OF ADHESION N/A 01/29/2021   Procedure: LYSIS OF ADHESION;  Surgeon: [name], MD;  Location: [facility];  Service: General;  Laterality: N/A;   TONSILLECTOMY      Family History  Problem Relation Age of Onset   Asthma Mother    Ulcers Mother    Bipolar disorder Mother    ADD / ADHD Father    Ulcerative colitis Maternal Aunt    Cancer Maternal Aunt        Leukemia   Ulcerative colitis Paternal Uncle  Colon polyps Maternal Grandmother    Diabetes Maternal Grandmother    Hypertension Maternal Grandmother    Sleep apnea Maternal Grandmother    Heart disease Maternal Grandmother    Diabetes Maternal Grandfather    Hypertension Maternal Grandfather    Sleep apnea Maternal Grandfather    Heart disease Maternal Grandfather    Cancer Maternal Grandfather        prostate   Colon cancer Neg Hx    Liver disease Neg Hx     Allergies as of 08/28/2022 - Review Complete 08/26/2022  Allergen Reaction Noted   Nsaids Other (See Comments) 01/09/2017   Other Other (See Comments) 01/09/2017   Pineapple Swelling and Other (See Comments) 04/14/2011   Remicade [infliximab] Other (See Comments) 01/02/2017   Strawberry extract Swelling 05/08/2012   Amoxicillin-pot clavulanate Nausea And Vomiting and Other (See Comments) 02/28/2011   Omeprazole Nausea And Vomiting 04/20/2013   Tomato Rash  04/14/2011    Social History   Socioeconomic History   Marital status: Single    Spouse name: Not on file   Number of children: 1   Years of education: 12th   Highest education level: Not on file  Occupational History    Employer: NOT EMPLOYED    Comment: disabled  Tobacco Use   Smoking status: Former    Packs/day: 0.00    Years: 2.00    Additional pack years: 0.00    Total pack years: 0.00    Types: Cigarettes    Quit date: 10/06/2012    Years since quitting: 9.8   Smokeless tobacco: Never   Tobacco comments:    Quit sometime after his second psychiatry appointment in early 2024  Vaping Use   Vaping Use: Never used  Substance and Sexual Activity   Alcohol use: Not Currently    Comment: 1 beer on special occasions   Drug use: Not Currently    Types: Marijuana    Comment: Stopped after initial psychiatry appointment.  Rolled blunt at night to help him sleep will also use when getting irritable.  Started week of 06/23/22   Sexual activity: Yes    Birth control/protection: None  Other Topics Concern   Not on file  Social History Narrative   Lives with mother   Cory Roughen and Elenor Legato in the home.   Caffeine Use: occasionally   Diet: eats all food groups   Water: 6-8 cups daily       Likes to game late and sleep in until noon   [facility]      Does not drive   Wears seat belt    Smoke detectors    No weapons   Right handed   Social Determinants of Health   Financial Resource Strain: Low Risk  (06/12/2020)   Overall Financial Resource Strain (CARDIA)    Difficulty of Paying Living Expenses: Not hard at all  Food Insecurity: No Food Insecurity (06/12/2020)   Hunger Vital Sign    Worried About Running Out of Food in the Last Year: Never true    Ran Out of Food in the Last Year: Never true  Transportation Needs: No Transportation Needs (06/12/2020)   PRAPARE - Hydrologist (Medical): No    Lack of Transportation  (Non-Medical): No  Physical Activity: Inactive (06/12/2020)   Exercise Vital Sign    Days of Exercise per Week: 0 days    Minutes of Exercise per Session: 0 min  Stress: Stress Concern  Present (06/12/2020)   [place]    Feeling of Stress : To some extent  Social Connections: Moderately Isolated (06/12/2020)   Social Connection and Isolation Panel [NHANES]    Frequency of Communication with Friends and Family: More than three times a week    Frequency of Social Gatherings with Friends and Family: Twice a week    Attends Religious Services: 1 to 4 times per year    Active Member of [employer] or Organizations: No    Attends Archivist Meetings: Never    Marital Status: Separated  Intimate Partner Violence: Not At Risk (06/12/2020)   Humiliation, Afraid, Rape, and Kick questionnaire    Fear of Current or Ex-Partner: No    Emotionally Abused: No    Physically Abused: No    Sexually Abused: No     Review of Systems   Gen: Denies any fever, chills, fatigue, weight loss, lack of appetite.  CV: Denies chest pain, heart palpitations, peripheral edema, syncope.  Resp: Denies shortness of breath at rest or with exertion. Denies wheezing or cough.  GI: see HPI GU : Denies urinary burning, urinary frequency, urinary hesitancy MS: Denies joint pain, muscle weakness, cramps, or limitation of movement.  Derm: Denies rash, itching, dry skin Psych: Denies depression, anxiety, memory loss, and confusion Heme: Denies bruising, bleeding, and enlarged lymph nodes.   Physical Exam   There were no vitals taken for this visit.  General:   Alert and oriented. Pleasant and cooperative. Well-nourished and well-developed.  Head:  Normocephalic and atraumatic. Eyes:  Without icterus, sclera clear and conjunctiva pink.  Ears:  Normal auditory acuity. Mouth:  No deformity or lesions, oral mucosa pink.  Lungs:  Clear to auscultation  bilaterally. No wheezes, rales, or rhonchi. No distress.  Heart:  S1, S2 present without murmurs appreciated.  Abdomen:  +BS, soft, non-tender and non-distended. No HSM noted. No guarding or rebound. No masses appreciated.  Rectal:  Deferred  Msk:  Symmetrical without gross deformities. Normal posture. Extremities:  Without edema. Neurologic:  Alert and  oriented x4;  grossly normal neurologically. Skin:  Intact without significant lesions or rashes. Psych:  Alert and cooperative. Normal mood and affect.   Assessment   Wayne Avila is a 32 y.o. male with a history of ADHD, Seizure disorder, Developmental delay, Bipolar disorder, hard of hearing, GERD, ulcerative pancolitis with severe refractory disease unresponsive to medical therapy and s/p total colectomy with J pouch presenting today to establish care.   Ulcerative pancolitis:    PLAN   ***    Venetia Night, MSN, FNP-BC, AGACNP-BC [facility]

## 2022-08-28 ENCOUNTER — Ambulatory Visit: Payer: Medicaid Other | Admitting: Gastroenterology

## 2022-09-08 NOTE — Progress Notes (Unsigned)
GI Office Note    Referring Provider: Lindell Spar, MD Primary Care Physician:  Lindell Spar, MD  Primary Gastroenterologist: Elon Alas. Abbey Chatters, DO, previously Dr. [name].   Chief Complaint   Chief Complaint  Patient presents with   Ulcerative Pancolitis    Patient here today for a follow up on his Ulcerative pancolitis.Patient says he had a j pouch placed in September 2020. They say they were never told patient needed to follow a gi doctor, so they needed to establish care.Patient denies any current gi issues.    History of Present Illness   Wayne Avila is a 32 y.o. male presenting today at the request of Lindell Spar, MD for ulcerative pancolitis. History of ADHD, Seizure disorder, Developmental delay, Bipolar disorder, hard of hearing, GERD, ulcerative pancolitis with severe refractory disease unresponsive to medical therapy.   Colonoscopy June 2017: -Abnormal DRE (severe erythema, edema, and ulcers from the anal verge to about 20 cm above the anal verge) -fulminant chronic active colitis consistent with IBD -TI normal s/p biopsy (intramucosal lymphoid aggregates) -Entire colon normal, s/p biopsy (chronic inactive colitis) -Diffuse area of granular mucosa found in the rectum -Advised Apriso and enemas or a biologic agent.  Advised to avoid NSAIDs and tobacco -Advised repeat colonoscopy in 10 years for surveillance  EGD June 2017: -Normal esophagus -Normal stomach s/p biopsy -Normal duodenum s/p biopsy -Biopsies benign  Review of chart it appears patient underwent laparoscopic cholecystectomy at [facility] in September 2022.  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 He is now s/p total colectomy in May 2020 at [facility] [place] , followed by completion proctectomy with J-pouch creation in Sept [id number] which was complicated by a contained bowel perforation managed with percutaneous drains, antibiotics and TPN. He eventually recovered from his surgery.  He was followed by  Dr. Launa Flight at [facility].  His last office visit was in December 2020.  He continued to have some looser stools and was started on Imodium, Bentyl, and a barrier cream to his perianal area.  Was doing well at that time.  Reportedly was eating well and having a good amount of energy.   Labs 07/01/22: Vitamin D 25.9, TSH 0.949, B12 375, Hgb 15.3, plts 214, CMP unremarkable other than mildly low glucose.    Today:  Still having looser stools. Goes usually immediately after he eats. No pain in his abdominal. No blood in his stools. No melena. Usually solid brown. No N/V. No dysphagia. Has a good appetite. No unintentional weight loss. Taking 2000mg  Vitamin D daily. Energy level is pretty good.   Has been active recently. Working on mowing grass and weed eating. Gets bad headaches at times and take sumatriptan as needed.   Does have chronic reflux and has been taking pantoprazole 20 mg once daily.   Last flu shot:07/01/22 Last pneumonia shot:2017 (PSV 23) 2019 (PSV 13) Last zoster vaccine: N/A COVID-19 shot: Moderna vaccine in 2021.    Current Outpatient Medications  Medication Sig Dispense Refill   albuterol (PROVENTIL) (2.5 MG/3ML) 0.083% nebulizer solution Inhale 3 mLs (2.5 mg total) into the lungs every 6 (six) hours as needed for wheezing or shortness of breath. 75 mL 3   albuterol (VENTOLIN HFA) 108 (90 Base) MCG/ACT inhaler Inhale 2 puffs into the lungs every 4 (four) hours as needed for wheezing or shortness of breath. 1 each 2   cholecalciferol (VITAMIN D) 1000 units tablet Take 1 tablet (1,000 Units total) by mouth daily. START AFTER VITAMIN D 50,000  U TABLETS ARE COMPLETE (Patient taking differently: Take 1,000 Units by mouth daily.) 30 tablet 11   cyclobenzaprine (FLEXERIL) 10 MG tablet Take 1 tablet (10 mg total) by mouth 2 (two) times daily as needed for muscle spasms (Leg cramps). 30 tablet 1   EPINEPHrine 0.3 mg/0.3 mL IJ SOAJ injection Inject 0.3 mg into the muscle once as needed (for  severe allergic reaction). 1 each 1   fluticasone (FLONASE) 50 MCG/ACT nasal spray Place 1 spray into both nostrils daily. (Patient taking differently: Place 1 spray into both nostrils daily as needed for allergies.) 16 g 0   lamoTRIgine (LAMICTAL) 150 MG tablet Take 1 tablet (150 mg total) by mouth daily. 30 tablet 1   loratadine (CLARITIN) 10 MG tablet Take 1 tablet (10 mg total) by mouth daily. 90 tablet 1   montelukast (SINGULAIR) 10 MG tablet Take 1 tablet (10 mg total) by mouth at bedtime. 90 tablet 1   pantoprazole (PROTONIX) 20 MG tablet TAKE 1 TABLET(20 MG) BY MOUTH DAILY 90 tablet 0   Respiratory Therapy Supplies (NEBULIZER COMPRESSOR) KIT And supplies 1 each 0   SUMAtriptan (IMITREX) 100 MG tablet Take 1 tablet earliest onset of headache.  May repeat in 2 hours if headache persists or recurs.  Maximum 2 tablets in 24 hours. 10 tablet 5   No current facility-administered medications for this visit.    Past Medical History:  Diagnosis Date   ADHD (attention deficit hyperactivity disorder)    Anemia    Anxiety    Asthma    Autoimmune hepatitis    Autoimmune hepatitis 01/02/2017   Bipolar 1 disorder    Bipolar 1 disorder, manic, moderate 11/09/2014   C. difficile colitis 03/22/2016   C. difficile diarrhea 03/22/2016   Clostridium difficile colitis    Complication of anesthesia    Depression    Family history of adverse reaction to anesthesia    mother- N/v and headache   GERD (gastroesophageal reflux disease)    Headache    HOH (hard of hearing)    Hypertension    Mental developmental delay    Pneumonia    PONV (postoperative nausea and vomiting)    Pre-diabetes    Seizures    Ulcerative colitis     Past Surgical History:  Procedure Laterality Date   ADENOIDECTOMY     BIOPSY N/A 12/07/2012   Procedure: GASTRIC BIOPSIES;  Surgeon: [name], MD;  Location: [facility];  Service: Endoscopy;  Laterality: N/A;   BIOPSY N/A 08/30/2013   Procedure: BIOPSY;  Surgeon:  [name], MD;  Location: [facility];  Service: Endoscopy;  Laterality: N/A;  right colon,transverse colon, left colon, rectal biopsies   BIOPSY  11/20/2015   Procedure: BIOPSY;  Surgeon: [name], MD;  Location: [facility];  Service: Endoscopy;;  ileal, right colon biopsy, left colon, rectum   BIOPSY  04/18/2016   Procedure: BIOPSY;  Surgeon: [name], MD;  Location: [facility];  Service: Endoscopy;;  left descending colon biopsies   CHOLECYSTECTOMY N/A 01/29/2021   Procedure: LAPAROSCOPIC CHOLECYSTECTOMY WITH INTRAOPERATIVE CHOLANGIOGRAM AND ICG GYE , POSSIBLE OPEN;  Surgeon: [name], MD;  Location: [facility];  Service: General;  Laterality: N/A;   COLON SURGERY     ileostomy   COLONOSCOPY  11/20/2015   Dr. [name]: Severe erythema, edema, ulcers from the anal verge to 20 cm above the anal verge without mucosal sparing, remaining colon and terminal ileum appeared normal. Biopsies from the rectum  revealed fulminant active chronic colitis consistent with IBD. Pathology from terminal ileum revealed intramucosal lymphoid aggregates. Remaining colon random biopsies with inactive chronic colitis   COLONOSCOPY WITH PROPOFOL N/A 08/30/2013   [id number] mucosa in the terminal ileum/COLITIS/ MILD PROCTITIS. 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   COLONOSCOPY WITH PROPOFOL N/A 11/20/2015   Dr. [name]: chronic inactive pancolitis and active severe ulcerative proctitis    COLOPROCTECTOMY W/ ILEO J POUCH     ESOPHAGOGASTRODUODENOSCOPY  11/20/2015   Dr. [name]: Normal exam, stomach biopsied and duodenal biopsy.. Benign biopsies.   ESOPHAGOGASTRODUODENOSCOPY (EGD) WITH PROPOFOL N/A 12/07/2012   SLF:The mucosa of the esophagus appeared normal Non-erosive gastritis (inflammation) was found in the gastric antrum; multiple biopsies The duodenal mucosa showed no abnormalities in the  bulb and second portion of the duodenum   ESOPHAGOGASTRODUODENOSCOPY (EGD) WITH PROPOFOL N/A 11/20/2015   Dr. [name]: normal with normal biopsies, negative H.pylori    ESOPHAGOGASTRODUODENOSCOPY (EGD) WITH PROPOFOL N/A 04/18/2016   Procedure: ESOPHAGOGASTRODUODENOSCOPY (EGD) WITH PROPOFOL;  Surgeon: [name], MD;  [facility];  Service: Endoscopy;  Laterality: N/A;   FLEXIBLE SIGMOIDOSCOPY N/A 04/18/2016   Procedure: FLEXIBLE SIGMOIDOSCOPY;  Surgeon: [name], MD;  [facility];  Service: Endoscopy;  Laterality: N/A;   LYSIS OF ADHESION N/A 01/29/2021   Procedure: LYSIS OF ADHESION;  Surgeon: [name], MD;  Location: [facility];  Service: General;  Laterality: N/A;   TONSILLECTOMY      Family History  Problem Relation Age of Onset   Asthma Mother    Ulcers Mother    Bipolar disorder Mother    ADD / ADHD Father    Ulcerative colitis Maternal Aunt    Cancer Maternal Aunt        Leukemia   Ulcerative colitis Paternal Uncle    Colon polyps Maternal Grandmother    Diabetes Maternal Grandmother    Hypertension Maternal Grandmother    Sleep apnea Maternal Grandmother    Heart disease Maternal Grandmother    Diabetes Maternal Grandfather    Hypertension Maternal Grandfather    Sleep apnea Maternal Grandfather    Heart disease Maternal Grandfather    Cancer Maternal Grandfather        prostate   Colon cancer Neg Hx    Liver disease Neg Hx     Allergies as of 09/09/2022 - Review Complete 09/09/2022  Allergen Reaction Noted   Nsaids Other (See Comments) 01/09/2017   Other Other (See Comments) 01/09/2017   Pineapple Swelling and Other (See Comments) 04/14/2011   Remicade [infliximab] Other (See Comments) 01/02/2017   Strawberry extract Swelling 05/08/2012   Amoxicillin-pot clavulanate Nausea And Vomiting and Other (See Comments) 02/28/2011   Omeprazole Nausea And Vomiting 04/20/2013   Tomato Rash 04/14/2011    Social History   Socioeconomic  History   Marital status: Single    Spouse name: Not on file   Number of children: 1   Years of education: 12th   Highest education level: Not on file  Occupational History    Employer: NOT EMPLOYED    Comment: disabled  Tobacco Use   Smoking status: Former    Packs/day: 0.00    Years: 2.00    Additional pack years: 0.00    Total pack years: 0.00    Types: Cigarettes    Quit date: 10/06/2012    Years since quitting: 9.9   Smokeless tobacco: Never  Tobacco comments:    Quit sometime after his second psychiatry appointment in early 2024  Vaping Use   Vaping Use: Never used  Substance and Sexual Activity   Alcohol use: Not Currently    Comment: 1 beer on special occasions   Drug use: Not Currently    Types: Marijuana    Comment: Stopped after initial psychiatry appointment.  Rolled blunt at night to help him sleep will also use when getting irritable.  Started week of 06/23/22   Sexual activity: Yes    Birth control/protection: None  Other Topics Concern   Not on file  Social History Narrative   Lives with mother   Cory Roughen and Elenor Legato in the home.   Caffeine Use: occasionally   Diet: eats all food groups   Water: 6-8 cups daily       Likes to game late and sleep in until noon   [facility]      Does not drive   Wears seat belt    Smoke detectors    No weapons   Right handed   Social Determinants of Health   Financial Resource Strain: Low Risk  (06/12/2020)   Overall Financial Resource Strain (CARDIA)    Difficulty of Paying Living Expenses: Not hard at all  Food Insecurity: No Food Insecurity (06/12/2020)   Hunger Vital Sign    Worried About Running Out of Food in the Last Year: Never true    Ran Out of Food in the Last Year: Never true  Transportation Needs: No Transportation Needs (06/12/2020)   PRAPARE - Hydrologist (Medical): No    Lack of Transportation (Non-Medical): No  Physical Activity: Inactive  (06/12/2020)   Exercise Vital Sign    Days of Exercise per Week: 0 days    Minutes of Exercise per Session: 0 min  Stress: Stress Concern Present (06/12/2020)   [place]    Feeling of Stress : To some extent  Social Connections: Moderately Isolated (06/12/2020)   Social Connection and Isolation Panel [NHANES]    Frequency of Communication with Friends and Family: More than three times a week    Frequency of Social Gatherings with Friends and Family: Twice a week    Attends Religious Services: 1 to 4 times per year    Active Member of [employer] or Organizations: No    Attends Archivist Meetings: Never    Marital Status: Separated  Intimate Partner Violence: Not At Risk (06/12/2020)   Humiliation, Afraid, Rape, and Kick questionnaire    Fear of Current or Ex-Partner: No    Emotionally Abused: No    Physically Abused: No    Sexually Abused: No     Review of Systems   Gen: Denies any fever, chills, fatigue, weight loss, lack of appetite.  CV: Denies chest pain, heart palpitations, peripheral edema, syncope.  Resp: Denies shortness of breath at rest or with exertion. Denies wheezing or cough.  GI: see HPI GU : Denies urinary burning, urinary frequency, urinary hesitancy MS: Denies joint pain, muscle weakness, cramps, or limitation of movement.  Derm: Denies rash, itching, dry skin Psych: Denies depression, anxiety, memory loss, and confusion Heme: Denies bruising, bleeding, and enlarged lymph nodes.   Physical Exam   BP 122/78 (BP Location: Left Arm, Patient Position: Sitting, Cuff Size: Large)   Pulse 76   Temp 97.8 F (36.6 C) (Temporal)   Ht 5' 8.5" (  1.74 m)   Wt 244 lb (110.7 kg)   BMI 36.56 kg/m   General:   Alert and oriented. Pleasant and cooperative. Well-nourished and well-developed.  Head:  Normocephalic and atraumatic. Eyes:  Without icterus, sclera clear and conjunctiva pink.  Ears:  Normal  auditory acuity. Mouth:  No deformity or lesions, oral mucosa pink.  Lungs:  Clear to auscultation bilaterally. No wheezes, rales, or rhonchi. No distress.  Heart:  S1, S2 present without murmurs appreciated.  Abdomen:  +BS, soft, non-tender and non-distended. No HSM noted. No guarding or rebound. No masses appreciated.  Rectal:  Deferred  Msk:  Symmetrical without gross deformities. Normal posture. Extremities:  Without edema. Neurologic:  Alert and  oriented x4;  grossly normal neurologically. Skin:  Intact without significant lesions or rashes. Psych:  Alert and cooperative. Normal mood and affect.   Assessment   Wayne Avila is a 32 y.o. male with a history of ADHD, Seizure disorder, Developmental delay, Bipolar disorder, hard of hearing, GERD, ulcerative pancolitis with severe refractory disease unresponsive to medical therapy and s/p total colectomy with J pouch presenting today to establish care.    Ulcerative pancolitis, loose stools: Ulcerative pancolitis diagnosed on colonoscopy in 2017.  Initially treated with Apriso.  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  He began following with [facility] GI surgery, Dr. Nikki Dom and he underwent total colectomy and completion proctectomy with creation of a J-pouch.  This was complicated by bowel perforation for which he had multiple drains placed and briefly had TPN for nutrition.  He eventually recovered from surgery and followed up outpatient. Diarrhea was being managed with Imodium and Bentyl as needed.  Currently without any significant GI issues.  Does have a history of a cholecystectomy in the past and has diarrhea requiring daily.  Denies any melena, BRBPR, abdominal pain, weight loss, lack of appetite.  To help with postprandial diarrhea which I suspect is bile acid.  Cholecystectomy we will cholestyramine 4 g once daily.  Advised on proper administration and to  monitor for constipation.  GERD: Controlled on pantoprazole 20 mg once daily.  Provided refill today.   PLAN   Continue pantoprazole 20 mg once daily. Refilled today.  Continue Vitamin D supplements.  Start Cholestyramine 4g once daily. Advised on proper administration.  Follow up in 4 months.    Venetia Night, MSN, FNP-BC, AGACNP-BC [facility]

## 2022-09-09 ENCOUNTER — Encounter: Payer: Self-pay | Admitting: Gastroenterology

## 2022-09-09 ENCOUNTER — Ambulatory Visit (INDEPENDENT_AMBULATORY_CARE_PROVIDER_SITE_OTHER): Payer: Medicaid Other | Admitting: Gastroenterology

## 2022-09-09 VITALS — BP 122/78 | HR 76 | Temp 97.8°F | Ht 68.5 in | Wt 244.0 lb

## 2022-09-09 DIAGNOSIS — K219 Gastro-esophageal reflux disease without esophagitis: Secondary | ICD-10-CM | POA: Diagnosis not present

## 2022-09-09 DIAGNOSIS — R197 Diarrhea, unspecified: Secondary | ICD-10-CM | POA: Diagnosis not present

## 2022-09-09 DIAGNOSIS — K51019 Ulcerative (chronic) pancolitis with unspecified complications: Secondary | ICD-10-CM

## 2022-09-09 MED ORDER — CHOLESTYRAMINE 4 GM/DOSE PO POWD: 4.0000 g | Freq: Every day | ORAL | 12 refills | Status: DC

## 2022-09-09 MED ORDER — PANTOPRAZOLE SODIUM 20 MG PO TBEC: DELAYED_RELEASE_TABLET | ORAL | 0 refills | Status: DC

## 2022-09-09 NOTE — Patient Instructions (Addendum)
I sent a refill for your pantoprazole for you to take 20 mg once daily.  Continue taking your vitamin D supplements daily.  To help you with your looser stools I would you start taking cholestyramine 4 g once daily.  Please monitor for any constipation.  If you start developing any constipation please let me know and we will reduce your dose.  It is very important that you take your normal medications 1 hour prior to your cholestyramine or 4-6 hours after.  It was a pleasure to see you today. I want to create trusting relationships with patients. If you receive a survey regarding your visit,  I greatly appreciate you taking time to fill this out on paper or through your MyChart. I value your feedback.  Venetia Night, MSN, FNP-BC, AGACNP-BC [facility]

## 2022-09-25 ENCOUNTER — Telehealth (HOSPITAL_COMMUNITY): Payer: No Typology Code available for payment source | Admitting: Psychiatry

## 2022-10-06 ENCOUNTER — Telehealth (HOSPITAL_COMMUNITY): Payer: No Typology Code available for payment source | Admitting: Psychiatry

## 2022-10-15 DIAGNOSIS — L259 Unspecified contact dermatitis, unspecified cause: Principal | ICD-10-CM

## 2022-10-15 MED ORDER — HYDROCORTISONE 2.5 % LOTION
Freq: Two times a day (BID) | TOPICAL | 0 refills | 5 days | Status: CP
Start: 2022-10-15 — End: 2022-10-20

## 2022-10-16 ENCOUNTER — Ambulatory Visit: Admit: 2022-10-16 | Discharge: 2022-10-16 | Disposition: A | Discharge status: Home / Self Care | Payer: MEDICAID

## 2022-11-06 ENCOUNTER — Other Ambulatory Visit: Payer: Self-pay | Admitting: Nurse Practitioner

## 2022-11-06 DIAGNOSIS — F901 Attention-deficit hyperactivity disorder, predominantly hyperactive type: Secondary | ICD-10-CM

## 2022-11-06 DIAGNOSIS — F319 Bipolar disorder, unspecified: Secondary | ICD-10-CM

## 2022-11-20 ENCOUNTER — Telehealth (HOSPITAL_COMMUNITY): Payer: No Typology Code available for payment source | Admitting: Psychiatry

## 2022-11-27 ENCOUNTER — Telehealth (HOSPITAL_COMMUNITY): Payer: No Typology Code available for payment source | Admitting: Psychiatry

## 2022-12-14 ENCOUNTER — Other Ambulatory Visit: Payer: Self-pay | Admitting: Gastroenterology

## 2022-12-14 DIAGNOSIS — K219 Gastro-esophageal reflux disease without esophagitis: Secondary | ICD-10-CM

## 2022-12-17 DIAGNOSIS — R079 Chest pain, unspecified: Principal | ICD-10-CM

## 2022-12-17 MED ORDER — ONDANSETRON 4 MG DISINTEGRATING TABLET
ORAL_TABLET | Freq: Three times a day (TID) | ORAL | 0 refills | 5 days | Status: CP | PRN
Start: 2022-12-17 — End: 2022-12-24

## 2022-12-18 ENCOUNTER — Emergency Department
Admit: 2022-12-18 | Discharge: 2022-12-18 | Disposition: A | Discharge status: Home / Self Care | Payer: PRIVATE HEALTH INSURANCE

## 2022-12-18 ENCOUNTER — Ambulatory Visit
Admit: 2022-12-18 | Discharge: 2022-12-18 | Disposition: A | Discharge status: Home / Self Care | Payer: PRIVATE HEALTH INSURANCE

## 2022-12-18 ENCOUNTER — Telehealth: Payer: Self-pay

## 2022-12-18 NOTE — Transitions of Care (Post Inpatient/ED Visit) (Signed)
   12/18/2022  Name: Wayne Avila MRN: [id number] DOB: 1991-05-09  Today's TOC FU Call Status: Today's TOC FU Call Status:: Unsuccessul Call (1st Attempt) Unsuccessful Call (1st Attempt) Date: 12/18/22  Attempted to reach the patient regarding the most recent Inpatient/ED visit.  Follow Up Plan: Additional outreach attempts will be made to reach the patient to complete the Transitions of Care (Post Inpatient/ED visit) call.   Signature   Woodfin Ganja LPN [facility] Nurse Health Advisor Direct Dial [phone]

## 2022-12-22 NOTE — Transitions of Care (Post Inpatient/ED Visit) (Signed)
12/22/2022  Name: Wayne Avila MRN: [id number] DOB: 27-Jun-1990  Today's TOC FU Call Status: Today's TOC FU Call Status:: Successful TOC FU Call Competed Unsuccessful Call (1st Attempt) Date: 12/18/22 [facility] FU Call Complete Date: 12/22/22  Transition Care Management Follow-up Telephone Call Date of Discharge: 12/17/22 Discharge Facility: Other ([facility]) Name of Other ([facility]) Discharge Facility: [facility] Type of Discharge: Emergency Department Reason for ED Visit: Other: (chest pain) How have you been since you were released from the hospital?: Better Any questions or concerns?: No  Items Reviewed: Did you receive and understand the discharge instructions provided?: Yes Medications obtained,verified, and reconciled?: Yes (Medications Reviewed) Any new allergies since your discharge?: No Dietary orders reviewed?: Yes Do you have support at home?: Yes  Medications Reviewed Today: Medications Reviewed Today     Reviewed by Merleen Nicely, LPN (Licensed Practical Nurse) on 12/22/22 at 1409  Med List Status: <None>   Medication Order Taking? Sig Documenting Provider Last Dose Status Informant  albuterol (PROVENTIL) (2.5 MG/3ML) 0.083% nebulizer solution [id number] Yes Inhale 3 mLs (2.5 mg total) into the lungs every 6 (six) hours as needed for wheezing or shortness of breath. Gilda Crease, MD Taking Active   albuterol (VENTOLIN HFA) 108 (90 Base) MCG/ACT inhaler [id number] Yes Inhale 2 puffs into the lungs every 4 (four) hours as needed for wheezing or shortness of breath. Gilda Crease, MD Taking Active   cholecalciferol (VITAMIN D) 1000 units tablet [id number] Yes Take 1 tablet (1,000 Units total) by mouth daily. START AFTER VITAMIN D 50,000 U TABLETS ARE COMPLETE  Patient taking differently: Take 1,000 Units by mouth daily.   West Bali, MD Taking Active Mother  cholestyramine Lanetta Inch) 4 GM/DOSE powder [id number] Yes Take 1 packet (4 g total) by  mouth daily. Take 1 hour after morning medications. Do not take any other medication until 4-6 hours after taking. Aida Raider, NP Taking Active   cyclobenzaprine (FLEXERIL) 10 MG tablet [id number] Yes Take 1 tablet (10 mg total) by mouth 2 (two) times daily as needed for muscle spasms (Leg cramps). Anabel Halon, MD Taking Active   EPINEPHrine 0.3 mg/0.3 mL IJ SOAJ injection [id number] Yes Inject 0.3 mg into the muscle once as needed (for severe allergic reaction). Heather Roberts, NP Taking Active Mother  fluticasone [facility]) 50 MCG/ACT nasal spray [id number] Yes Place 1 spray into both nostrils daily.  Patient taking differently: Place 1 spray into both nostrils daily as needed for allergies.   Glynn Octave, MD Taking Active Mother  lamoTRIgine (LAMICTAL) 150 MG tablet [id number] Yes Take 1 tablet (150 mg total) by mouth daily. Elsie Lincoln, MD Taking Active   loratadine (CLARITIN) 10 MG tablet [id number] Yes Take 1 tablet (10 mg total) by mouth daily. Anabel Halon, MD Taking Active   montelukast (SINGULAIR) 10 MG tablet [id number] Yes Take 1 tablet (10 mg total) by mouth at bedtime. Anabel Halon, MD Taking Active   pantoprazole (PROTONIX) 20 MG tablet [id number] Yes TAKE 1 TABLET(20 MG) BY MOUTH DAILY Aida Raider, NP Taking Active   Respiratory Therapy Supplies (NEBULIZER COMPRESSOR) KIT [id number] Yes And supplies Eustace Moore, MD Taking Active Mother  SUMAtriptan (IMITREX) 100 MG tablet [id number] Yes Take 1 tablet earliest onset of headache.  May repeat in 2 hours if headache persists or recurs.  Maximum 2 tablets in 24 hours. Heather Roberts, NP Taking Active             Home  Care and Equipment/Supplies: Were Home Health Services Ordered?: NA Any new equipment or medical supplies ordered?: NA  Functional Questionnaire: Do you need assistance with bathing/showering or dressing?: No Do you need assistance with meal preparation?: No Do you need assistance with  eating?: No Do you have difficulty maintaining continence: No Do you need assistance with getting out of bed/getting out of a chair/moving?: No Do you have difficulty managing or taking your medications?: No  Follow up appointments reviewed: PCP Follow-up appointment confirmed?: Yes Date of PCP follow-up appointment?: 12/30/22 Follow-up Provider: [facility] Follow-up appointment confirmed?: No Do you need transportation to your follow-up appointment?: No Do you understand care options if your condition(s) worsen?: Yes-patient verbalized understanding    SIGNATURE  Woodfin Ganja LPN [facility] Nurse Health Advisor Direct Dial [phone]

## 2022-12-30 ENCOUNTER — Encounter: Payer: Self-pay | Admitting: Internal Medicine

## 2022-12-30 ENCOUNTER — Ambulatory Visit: Payer: Medicaid Other | Admitting: Internal Medicine

## 2023-01-08 NOTE — Progress Notes (Deleted)
NEUROLOGY FOLLOW UP OFFICE NOTE  Wayne Avila [id number]  Assessment/Plan:     1.  Migraine without aura, without status migrainosus, not intractable 2.  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  Bipolar 1 disorder/mental developmental delay   1.  As migraines are infrequent, defer preventative medication *** 2.  For rescue:  Sumatriptan 100mg  with promethazine 25mg .  *** 3.  Limit use of pain relievers to no more than 2 days out of week to prevent risk of rebound or medication-overuse headache. 4.  Keep headache diary 5. Follow up 1 year ***     Subjective:  Wayne Avila is a 32 year old male with ADHD, Bipolar 1 disorder, ulcerative colitis and remote history of seizures (not on medication) who follows up for migraines.    UPDATE ***: Intensity:  severe Duration:  1 hour with sumatriptan Frequency:  1 to 2 times a month Frequency of abortive medication: 1 to 2 times a month Current NSAIDS/analgesics:  Tylenol Current triptans:  sumatriptan 100mg  Current ergotamine:  none Current anti-emetic:  none Current muscle relaxants:  none Current Antihypertensive medications:  Propranolol 10mg  TID Current Antidepressant medications:  Trazodone  Current Anticonvulsant medications:  none Current anti-CGRP:  none Current Vitamins/Herbal/Supplements:  D Current Antihistamines/Decongestants: Claritin Other therapy:  none Hormone/birth control:  none  Caffeine:  No coffee.  No soda Alcohol:  1 drink every 2 weeks Smoker:  1 cigarette a day Diet:  1 to 2 glasses of water.  No soda.  Does not skip meals Exercise:  no Depression:  no; Anxiety:  no Other pain:  no Sleep:  Sleeps well  HISTORY:  Onset:  2-3 years ago Location:  Predominantly frontal and temporal bilaterally Quality:  pounding Intensity:  severe Aura:  no Prodrome:  no Associated symptoms:  Nausea, vomiting, photophobia, phonophobia, elevated blood pressure.  He denies associated unilateral  numbness or weakness. Duration:  1 to 2 days.  With Fioricet, 15-20 days Frequency:  1 to 2 times a month Frequency of abortive medication: 2 times a month Triggers:  unsure Relieving factors:  Nothing.   Activity:  Movement aggravates migraine   CT head on 08/27/2017 to evaluate headache personally reviewed and was normal.  He had a seizure in 2015.     Remote history of seizures.  First seizure occurred in February 2014, which occurred in the middle of an argument.  Semiology staring episode, loss of consciousness and convulsions.  MRI of brain with and without contrast on 04/03/2014 personally reviewed was normal.  EEGs were normal.  He was admitted to the EMU at [facility] in 2015 but did not have an event during stay.  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  He was previously on Depakote.  He hasn't had a recurrent spell in years.       Past NSAIDS/analgesics:  Fioricet, ibuprofen, tramadol, maybe an opioid in the ED Past abortive triptans:  none Past abortive ergotamine:  none Past muscle relaxants:  Flexeril Past anti-emetic:  Zofran, Reglan, Phenergan Past antihypertensive medications:  clonidine Past antidepressant medications:  sertraline Past anticonvulsant medications:  Depakote Past anti-CGRP:  none Past vitamins/Herbal/Supplements:  none Past antihistamines/decongestants:  Benadryl, Sudafed, Flonase Other past therapies:  none    Family history of headache: Mom, aunt  PAST MEDICAL HISTORY: Past Medical History:  Diagnosis Date   ADHD (attention deficit hyperactivity disorder)    Anemia    Anxiety    Asthma    Autoimmune  hepatitis (HCC)    Autoimmune hepatitis (HCC) 01/02/2017   Bipolar 1 disorder (HCC)    Bipolar 1 disorder, manic, moderate (HCC) 11/09/2014   C. difficile colitis 03/22/2016   C. difficile diarrhea 03/22/2016   Clostridium difficile colitis    Complication of anesthesia    Depression    Family history of adverse  reaction to anesthesia    mother- N/v and headache   GERD (gastroesophageal reflux disease)    Headache    HOH (hard of hearing)    Hypertension    Mental developmental delay    Pneumonia    PONV (postoperative nausea and vomiting)    Pre-diabetes    Seizures (HCC)    Ulcerative colitis (HCC)     MEDICATIONS: Current Outpatient Medications on File Prior to Visit  Medication Sig Dispense Refill   albuterol (PROVENTIL) (2.5 MG/3ML) 0.083% nebulizer solution Inhale 3 mLs (2.5 mg total) into the lungs every 6 (six) hours as needed for wheezing or shortness of breath. 75 mL 3   albuterol (VENTOLIN HFA) 108 (90 Base) MCG/ACT inhaler Inhale 2 puffs into the lungs every 4 (four) hours as needed for wheezing or shortness of breath. 1 each 2   cholecalciferol (VITAMIN D) 1000 units tablet Take 1 tablet (1,000 Units total) by mouth daily. START AFTER VITAMIN D 50,000 U TABLETS ARE COMPLETE (Patient taking differently: Take 1,000 Units by mouth daily.) 30 tablet 11   cholestyramine (QUESTRAN) 4 GM/DOSE powder Take 1 packet (4 g total) by mouth daily. Take 1 hour after morning medications. Do not take any other medication until 4-6 hours after taking. 378 g 12   cyclobenzaprine (FLEXERIL) 10 MG tablet Take 1 tablet (10 mg total) by mouth 2 (two) times daily as needed for muscle spasms (Leg cramps). 30 tablet 1   EPINEPHrine 0.3 mg/0.3 mL IJ SOAJ injection Inject 0.3 mg into the muscle once as needed (for severe allergic reaction). 1 each 1   fluticasone (FLONASE) 50 MCG/ACT nasal spray Place 1 spray into both nostrils daily. (Patient taking differently: Place 1 spray into both nostrils daily as needed for allergies.) 16 g 0   lamoTRIgine (LAMICTAL) 150 MG tablet Take 1 tablet (150 mg total) by mouth daily. 30 tablet 1   loratadine (CLARITIN) 10 MG tablet Take 1 tablet (10 mg total) by mouth daily. 90 tablet 1   montelukast (SINGULAIR) 10 MG tablet Take 1 tablet (10 mg total) by mouth at bedtime. 90  tablet 1   pantoprazole (PROTONIX) 20 MG tablet TAKE 1 TABLET(20 MG) BY MOUTH DAILY 90 tablet 0   Respiratory Therapy Supplies (NEBULIZER COMPRESSOR) KIT And supplies 1 each 0   SUMAtriptan (IMITREX) 100 MG tablet Take 1 tablet earliest onset of headache.  May repeat in 2 hours if headache persists or recurs.  Maximum 2 tablets in 24 hours. 10 tablet 5   No current facility-administered medications on file prior to visit.    ALLERGIES: Allergies  Allergen Reactions   Nsaids Other (See Comments)    Should not take due to GI condition   Other Other (See Comments)    Should not take 2/2 GI condition Other reaction(s): Other (See Comments) Should not take due to GI condition   Pineapple Swelling and Other (See Comments)    Reaction:  Lip swelling   Remicade [Infliximab] Other (See Comments)    AUTOIMMUNE HEPATITIS   Strawberry Extract Swelling    Reaction:  Lip swelling   Amoxicillin-Pot Clavulanate Nausea And Vomiting and Other (  See Comments)    GI intolerance    Omeprazole Nausea And Vomiting   Tomato Rash    FAMILY HISTORY: Family History  Problem Relation Age of Onset   Asthma Mother    Ulcers Mother    Bipolar disorder Mother    ADD / ADHD Father    Ulcerative colitis Maternal Aunt    Cancer Maternal Aunt        Leukemia   Ulcerative colitis Paternal Uncle    Colon polyps Maternal Grandmother    Diabetes Maternal Grandmother    Hypertension Maternal Grandmother    Sleep apnea Maternal Grandmother    Heart disease Maternal Grandmother    Diabetes Maternal Grandfather    Hypertension Maternal Grandfather    Sleep apnea Maternal Grandfather    Heart disease Maternal Grandfather    Cancer Maternal Grandfather        prostate   Colon cancer Neg Hx    Liver disease Neg Hx       Objective:  *** General: No acute distress.  Patient appears well-groomed.   Head:  Normocephalic/atraumatic Eyes:  Fundi examined but not visualized Neck: supple, no paraspinal  tenderness, full range of motion Heart:  Regular rate and rhythm Neurological Exam: alert and oriented.  Speech fluent and not dysarthric, language intact.  CN II-XII intact. Bulk and tone normal, muscle strength 5/5 throughout.  Sensation to light touch intact.  Deep tendon reflexes 2+ throughout.  Finger to nose testing intact.  Gait normal, Romberg negative.   Shon Millet, DO  CC: Edwin Dada, FNP

## 2023-01-09 ENCOUNTER — Ambulatory Visit: Payer: Medicaid Other | Admitting: Neurology

## 2023-01-09 ENCOUNTER — Encounter: Payer: Self-pay | Admitting: Neurology

## 2023-01-09 DIAGNOSIS — S93402A Sprain of unspecified ligament of left ankle, initial encounter: Principal | ICD-10-CM

## 2023-01-09 DIAGNOSIS — Z029 Encounter for administrative examinations, unspecified: Secondary | ICD-10-CM

## 2023-01-09 MED ORDER — DICLOFENAC 1 % TOPICAL GEL
Freq: Four times a day (QID) | TOPICAL | 0 refills | 13 days | Status: CP
Start: 2023-01-09 — End: ?

## 2023-01-10 ENCOUNTER — Emergency Department
Admit: 2023-01-10 | Discharge: 2023-01-10 | Disposition: A | Discharge status: Home / Self Care | Payer: PRIVATE HEALTH INSURANCE

## 2023-01-10 ENCOUNTER — Ambulatory Visit
Admit: 2023-01-10 | Discharge: 2023-01-10 | Disposition: A | Discharge status: Home / Self Care | Payer: PRIVATE HEALTH INSURANCE

## 2023-01-12 ENCOUNTER — Ambulatory Visit: Payer: Medicaid Other | Admitting: Gastroenterology

## 2023-01-12 ENCOUNTER — Ambulatory Visit: Payer: MEDICAID | Admitting: Internal Medicine

## 2023-01-20 ENCOUNTER — Ambulatory Visit: Payer: MEDICAID | Admitting: Internal Medicine

## 2023-01-21 ENCOUNTER — Encounter: Payer: Self-pay | Admitting: Internal Medicine

## 2023-01-21 ENCOUNTER — Ambulatory Visit (INDEPENDENT_AMBULATORY_CARE_PROVIDER_SITE_OTHER): Payer: MEDICAID | Admitting: Internal Medicine

## 2023-01-21 VITALS — BP 120/73 | HR 68 | Ht 68.5 in | Wt 236.6 lb

## 2023-01-21 DIAGNOSIS — F3162 Bipolar disorder, current episode mixed, moderate: Secondary | ICD-10-CM | POA: Diagnosis not present

## 2023-01-21 DIAGNOSIS — G40909 Epilepsy, unspecified, not intractable, without status epilepticus: Secondary | ICD-10-CM

## 2023-01-21 DIAGNOSIS — K51019 Ulcerative (chronic) pancolitis with unspecified complications: Secondary | ICD-10-CM | POA: Diagnosis not present

## 2023-01-21 NOTE — Assessment & Plan Note (Signed)
Patient denies recent seizure activity.  Currently not on medication, has been seizure-free for many years now. Followed by neurology 

## 2023-01-21 NOTE — Patient Instructions (Signed)
You are being referred to [facility] clinic.  Please continue to take medications as prescribed.  Please continue to follow heart healthy diet and perform moderate exercise/walking at least 150 mins/week.

## 2023-01-21 NOTE — Assessment & Plan Note (Signed)
S/p colectomy Followed by GI Currently does not have melena or hematochezia Diarrhea improved with Questran

## 2023-01-21 NOTE — Progress Notes (Signed)
Acute Office Visit  Subjective:    Patient ID: Wayne Avila, male    DOB: 1991/03/14, 32 y.o.   MRN: [id number]  Chief Complaint  Patient presents with   Referral    Referral to a new psychiatrist    HPI Patient is in today for follow-up of bipolar disorder.  He was placed on Lamictal by Dr. Adrian Blackwater, but has lost follow-up with him.  He denied any major change in mood symptoms and anger issues with Lamictal.  He still has apathy, anger bursts and insomnia.  He requests referral to psychiatric clinic.  He denies any SI or HI currently.  Past Medical History:  Diagnosis Date   ADHD (attention deficit hyperactivity disorder)    Anemia    Anxiety    Asthma    Autoimmune hepatitis (HCC)    Autoimmune hepatitis (HCC) 01/02/2017   Bipolar 1 disorder (HCC)    Bipolar 1 disorder, manic, moderate (HCC) 11/09/2014   C. difficile colitis 03/22/2016   C. difficile diarrhea 03/22/2016   Clostridium difficile colitis    Complication of anesthesia    Depression    Family history of adverse reaction to anesthesia    mother- N/v and headache   GERD (gastroesophageal reflux disease)    Headache    HOH (hard of hearing)    Hypertension    Mental developmental delay    Pneumonia    PONV (postoperative nausea and vomiting)    Pre-diabetes    Seizures (HCC)    Ulcerative colitis (HCC)     Past Surgical History:  Procedure Laterality Date   ADENOIDECTOMY     BIOPSY N/A 12/07/2012   Procedure: GASTRIC BIOPSIES;  Surgeon: West Bali, MD;  Location: [facility];  Service: Endoscopy;  Laterality: N/A;   BIOPSY N/A 08/30/2013   Procedure: BIOPSY;  Surgeon: West Bali, MD;  Location: [facility];  Service: Endoscopy;  Laterality: N/A;  right colon,transverse colon, left colon, rectal biopsies   BIOPSY  11/20/2015   Procedure: BIOPSY;  Surgeon: West Bali, MD;  Location: [facility];  Service: Endoscopy;;  ileal, right colon biopsy, left colon, rectum   BIOPSY  04/18/2016   Procedure:  BIOPSY;  Surgeon: Corbin Ade, MD;  Location: [facility];  Service: Endoscopy;;  left descending colon biopsies   CHOLECYSTECTOMY N/A 01/29/2021   Procedure: LAPAROSCOPIC CHOLECYSTECTOMY WITH INTRAOPERATIVE CHOLANGIOGRAM AND ICG GYE , POSSIBLE OPEN;  Surgeon: Gaynelle Adu, MD;  Location: [facility];  Service: General;  Laterality: N/A;   COLON SURGERY     ileostomy   COLONOSCOPY  11/20/2015   Dr. Darrick Penna: Severe erythema, edema, ulcers from the anal verge to 20 cm above the anal verge without mucosal sparing, remaining colon and terminal ileum appeared normal. Biopsies from the rectum revealed fulminant active chronic colitis consistent with IBD. Pathology from terminal ileum revealed intramucosal lymphoid aggregates. Remaining colon random biopsies with inactive chronic colitis   COLONOSCOPY WITH PROPOFOL N/A 08/30/2013   WUJ:WJXBJY mucosa in the terminal ileum/COLITIS/ MILD PROCTITIS. 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   COLONOSCOPY WITH PROPOFOL N/A 11/20/2015   Dr. Darrick Penna: chronic inactive pancolitis and active severe ulcerative proctitis    COLOPROCTECTOMY W/ ILEO J POUCH     ESOPHAGOGASTRODUODENOSCOPY  11/20/2015   Dr. Darrick Penna: Normal exam, stomach biopsied and duodenal biopsy.. Benign biopsies.   ESOPHAGOGASTRODUODENOSCOPY (EGD) WITH PROPOFOL N/A 12/07/2012   SLF:The mucosa  of the esophagus appeared normal Non-erosive gastritis (inflammation) was found in the gastric antrum; multiple biopsies The duodenal mucosa showed no abnormalities in the bulb and second portion of the duodenum   ESOPHAGOGASTRODUODENOSCOPY (EGD) WITH PROPOFOL N/A 11/20/2015   Dr. Darrick Penna: normal with normal biopsies, negative H.pylori    ESOPHAGOGASTRODUODENOSCOPY (EGD) WITH PROPOFOL N/A 04/18/2016   Procedure: ESOPHAGOGASTRODUODENOSCOPY (EGD) WITH PROPOFOL;  Surgeon: Corbin Ade, MD;  Location: [facility];  Service: Endoscopy;  Laterality: N/A;   FLEXIBLE SIGMOIDOSCOPY N/A 04/18/2016   Procedure: FLEXIBLE SIGMOIDOSCOPY;  Surgeon: Corbin Ade, MD;  Location: [facility];  Service: Endoscopy;  Laterality: N/A;   LYSIS OF ADHESION N/A 01/29/2021   Procedure: LYSIS OF ADHESION;  Surgeon: Gaynelle Adu, MD;  Location: [facility];  Service: General;  Laterality: N/A;   TONSILLECTOMY      Family History  Problem Relation Age of Onset   Asthma Mother    Ulcers Mother    Bipolar disorder Mother    ADD / ADHD Father    Ulcerative colitis Maternal Aunt    Cancer Maternal Aunt        Leukemia   Ulcerative colitis Paternal Uncle    Colon polyps Maternal Grandmother    Diabetes Maternal Grandmother    Hypertension Maternal Grandmother    Sleep apnea Maternal Grandmother    Heart disease Maternal Grandmother    Diabetes Maternal Grandfather    Hypertension Maternal Grandfather    Sleep apnea Maternal Grandfather    Heart disease Maternal Grandfather    Cancer Maternal Grandfather        prostate   Colon cancer Neg Hx    Liver disease Neg Hx     Social History   Socioeconomic History   Marital status: Single    Spouse name: Not on file   Number of children: 1   Years of education: 12th   Highest education level: Not on file  Occupational History    Employer: NOT EMPLOYED    Comment: disabled  Tobacco Use   Smoking status: Former    Current packs/day: 0.00    Types: Cigarettes    Quit date: 10/07/2010    Years since quitting: 12.2   Smokeless tobacco: Never   Tobacco comments:    Quit sometime after his second psychiatry appointment in early 2024  Vaping Use   Vaping status: Never Used  Substance and Sexual Activity   Alcohol use: Not Currently    Comment: 1 beer on special occasions   Drug use: Not Currently    Types: Marijuana    Comment: Stopped after initial psychiatry appointment.  Rolled blunt at night to help him sleep will also use when getting irritable.   Started week of 06/23/22   Sexual activity: Yes    Birth control/protection: None  Other Topics Concern   Not on file  Social History Narrative   Lives with mother   Gearldine Shown and Celine Ahr in the home.   Caffeine Use: occasionally   Diet: eats all food groups   Water: 6-8 cups daily       Likes to game late and sleep in until noon   [facility]      Does not drive   Wears seat belt    Smoke detectors    No weapons   Right handed   Social Determinants of Health   Financial Resource Strain: Low Risk  (06/12/2020)   Overall Financial Resource Strain (CARDIA)  Difficulty of Paying Living Expenses: Not hard at all  Food Insecurity: No Food Insecurity (06/12/2020)   Hunger Vital Sign    Worried About Running Out of Food in the Last Year: Never true    Ran Out of Food in the Last Year: Never true  Transportation Needs: No Transportation Needs (06/12/2020)   PRAPARE - Administrator, Civil Service (Medical): No    Lack of Transportation (Non-Medical): No  Physical Activity: Inactive (06/12/2020)   Exercise Vital Sign    Days of Exercise per Week: 0 days    Minutes of Exercise per Session: 0 min  Stress: Stress Concern Present (06/12/2020)   [employer] of Occupational Health - Occupational Stress Questionnaire    Feeling of Stress : To some extent  Social Connections: Moderately Isolated (06/12/2020)   Social Connection and Isolation Panel [NHANES]    Frequency of Communication with Friends and Family: More than three times a week    Frequency of Social Gatherings with Friends and Family: Twice a week    Attends Religious Services: 1 to 4 times per year    Active Member of [employer] or Organizations: No    Attends Banker Meetings: Never    Marital Status: Separated  Intimate Partner Violence: Not At Risk (06/12/2020)   Humiliation, Afraid, Rape, and Kick questionnaire    Fear of Current or Ex-Partner: No    Emotionally Abused: No     Physically Abused: No    Sexually Abused: No    Outpatient Medications Prior to Visit  Medication Sig Dispense Refill   albuterol (PROVENTIL) (2.5 MG/3ML) 0.083% nebulizer solution Inhale 3 mLs (2.5 mg total) into the lungs every 6 (six) hours as needed for wheezing or shortness of breath. 75 mL 3   albuterol (VENTOLIN HFA) 108 (90 Base) MCG/ACT inhaler Inhale 2 puffs into the lungs every 4 (four) hours as needed for wheezing or shortness of breath. 1 each 2   cholecalciferol (VITAMIN D) 1000 units tablet Take 1 tablet (1,000 Units total) by mouth daily. START AFTER VITAMIN D 50,000 U TABLETS ARE COMPLETE (Patient taking differently: Take 1,000 Units by mouth daily.) 30 tablet 11   cholestyramine (QUESTRAN) 4 GM/DOSE powder Take 1 packet (4 g total) by mouth daily. Take 1 hour after morning medications. Do not take any other medication until 4-6 hours after taking. 378 g 12   cyclobenzaprine (FLEXERIL) 10 MG tablet Take 1 tablet (10 mg total) by mouth 2 (two) times daily as needed for muscle spasms (Leg cramps). 30 tablet 1   EPINEPHrine 0.3 mg/0.3 mL IJ SOAJ injection Inject 0.3 mg into the muscle once as needed (for severe allergic reaction). 1 each 1   fluticasone (FLONASE) 50 MCG/ACT nasal spray Place 1 spray into both nostrils daily. (Patient taking differently: Place 1 spray into both nostrils daily as needed for allergies.) 16 g 0   lamoTRIgine (LAMICTAL) 150 MG tablet Take 1 tablet (150 mg total) by mouth daily. 30 tablet 1   loratadine (CLARITIN) 10 MG tablet Take 1 tablet (10 mg total) by mouth daily. 90 tablet 1   montelukast (SINGULAIR) 10 MG tablet Take 1 tablet (10 mg total) by mouth at bedtime. 90 tablet 1   pantoprazole (PROTONIX) 20 MG tablet TAKE 1 TABLET(20 MG) BY MOUTH DAILY 90 tablet 0   Respiratory Therapy Supplies (NEBULIZER COMPRESSOR) KIT And supplies 1 each 0   SUMAtriptan (IMITREX) 100 MG tablet Take 1 tablet earliest onset of headache.  May  repeat in 2 hours if headache  persists or recurs.  Maximum 2 tablets in 24 hours. 10 tablet 5   No facility-administered medications prior to visit.    Allergies  Allergen Reactions   Nsaids Other (See Comments)    Should not take due to GI condition   Other Other (See Comments)    Should not take 2/2 GI condition Other reaction(s): Other (See Comments) Should not take due to GI condition   Pineapple Swelling and Other (See Comments)    Reaction:  Lip swelling   Remicade [Infliximab] Other (See Comments)    AUTOIMMUNE HEPATITIS   Strawberry Extract Swelling    Reaction:  Lip swelling   Amoxicillin-Pot Clavulanate Nausea And Vomiting and Other (See Comments)    GI intolerance    Omeprazole Nausea And Vomiting   Tomato Rash    Review of Systems  Constitutional:  Negative for chills and fever.  HENT:  Negative for congestion and sore throat.   Eyes:  Negative for pain and discharge.  Respiratory:  Negative for cough and shortness of breath.   Cardiovascular:  Negative for chest pain and palpitations.  Gastrointestinal:  Negative for constipation, diarrhea, nausea and vomiting.  Endocrine: Negative for polydipsia and polyuria.  Genitourinary:  Negative for dysuria and hematuria.  Musculoskeletal:  Negative for neck pain and neck stiffness.  Skin:  Negative for rash.  Neurological:  Positive for headaches. Negative for dizziness, weakness and numbness.  Psychiatric/Behavioral:  Positive for agitation, decreased concentration and sleep disturbance. The patient is nervous/anxious.        Objective:    Physical Exam Vitals reviewed.  Constitutional:      General: He is not in acute distress.    Appearance: He is not diaphoretic.  HENT:     Head: Normocephalic and atraumatic.     Nose: Nose normal.     Mouth/Throat:     Mouth: Mucous membranes are moist.  Eyes:     General: No scleral icterus.    Extraocular Movements: Extraocular movements intact.  Cardiovascular:     Rate and Rhythm: Normal rate  and regular rhythm.     Heart sounds: Normal heart sounds. No murmur heard. Pulmonary:     Breath sounds: Normal breath sounds. No wheezing or rales.  Musculoskeletal:     Cervical back: Neck supple. No tenderness.     Right lower leg: No edema.     Left lower leg: No edema.  Skin:    General: Skin is warm.     Findings: No rash.  Neurological:     General: No focal deficit present.     Mental Status: He is alert and oriented to person, place, and time.     Sensory: No sensory deficit.     Motor: No weakness.  Psychiatric:        Mood and Affect: Mood is depressed.        Behavior: Behavior is slowed.        Thought Content: Thought content does not include homicidal or suicidal ideation.     BP 120/73 (BP Location: Left Arm, Patient Position: Sitting, Cuff Size: Large)   Pulse 68   Ht 5' 8.5" (1.74 m)   Wt 236 lb 9.6 oz (107.3 kg)   SpO2 97%   BMI 35.45 kg/m  Wt Readings from Last 3 Encounters:  01/21/23 236 lb 9.6 oz (107.3 kg)  09/09/22 244 lb (110.7 kg)  07/24/22 244 lb 12.8 oz (111 kg)  Assessment & Plan:   Problem List Items Addressed This Visit       Digestive   Ulcerative colitis with complication (HCC)    S/p colectomy Followed by GI Currently does not have melena or hematochezia Diarrhea improved with Questran        Nervous and Auditory   Seizure disorder [facility])    Patient denies recent seizure activity.  Currently not on medication, has been seizure-free for many years now. Followed by neurology        Other   Bipolar disorder [facility]) - Primary    Had discussion with psychiatrist in the past Was placed on Lamictal, but did not continue as he lost follow-up Referred to [facility] behavioral health clinic      Relevant Orders   Ambulatory referral to Psychiatry     No orders of the defined types were placed in this encounter.    Anabel Halon, MD

## 2023-01-21 NOTE — Assessment & Plan Note (Addendum)
Had discussion with psychiatrist in the past Was placed on Lamictal, but did not continue as he lost follow-up Referred to [facility] behavioral health clinic

## 2023-02-04 ENCOUNTER — Other Ambulatory Visit: Payer: Self-pay | Admitting: Internal Medicine

## 2023-02-04 DIAGNOSIS — Z9109 Other allergy status, other than to drugs and biological substances: Secondary | ICD-10-CM

## 2023-03-17 NOTE — Progress Notes (Deleted)
NEUROLOGY FOLLOW UP OFFICE NOTE  SOTIRIOS NAVARRO [id number]  Assessment/Plan:     1.  Migraine without aura, without status migrainosus, not intractable 2.  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  Bipolar 1 disorder/mental developmental delay   1.  As migraines are infrequent, defer preventative medication 2.  For rescue:  Sumatriptan 100mg  with promethazine 25mg .  Stop Fioricet 3.  Limit use of pain relievers to no more than 2 days out of week to prevent risk of rebound or medication-overuse headache. 4.  Keep headache diary 5. Follow up 1 year ***     Subjective:  France Noyce is a 32 year old male with ADHD, Bipolar 1 disorder, ulcerative colitis and remote history of seizures (not on medication) who follows up for migraines.    UPDATE: Intensity:  severe Duration:  1 hour with sumatriptan Frequency:  1 to 2 times a month Frequency of abortive medication: 1 to 2 times a month Current NSAIDS/analgesics:  Tylenol Current triptans:  sumatriptan 100mg  Current ergotamine:  none Current anti-emetic:  none Current muscle relaxants:  none Current Antihypertensive medications:  Propranolol 10mg  TID Current Antidepressant medications:  Trazodone  Current Anticonvulsant medications:  none Current anti-CGRP:  none Current Vitamins/Herbal/Supplements:  D Current Antihistamines/Decongestants: Claritin Other therapy:  none Hormone/birth control:  none  Caffeine:  No coffee.  No soda Alcohol:  1 drink every 2 weeks Smoker:  1 cigarette a day Diet:  1 to 2 glasses of water.  No soda.  Does not skip meals Exercise:  no Depression:  no; Anxiety:  no Other pain:  no Sleep:  Sleeps well  HISTORY:  Onset:  2-3 years ago Location:  Predominantly frontal and temporal bilaterally Quality:  pounding Intensity:  severe Aura:  no Prodrome:  no Associated symptoms:  Nausea, vomiting, photophobia, phonophobia, elevated blood pressure.  He denies associated unilateral numbness or  weakness. Duration:  1 to 2 days.  With Fioricet, 15-20 days Frequency:  1 to 2 times a month Frequency of abortive medication: 2 times a month Triggers:  unsure Relieving factors:  Nothing.   Activity:  Movement aggravates migraine   CT head on 08/27/2017 to evaluate headache personally reviewed and was normal.  He had a seizure in 2015.     Remote history of seizures.  First seizure occurred in February 2014, which occurred in the middle of an argument.  Semiology staring episode, loss of consciousness and convulsions.  MRI of brain with and without contrast on 04/03/2014 personally reviewed was normal.  EEGs were normal.  He was admitted to the EMU at [facility] in 2015 but did not have an event during stay.  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  He was previously on Depakote.  He hasn't had a recurrent spell in years.       Past NSAIDS/analgesics:  Fioricet, ibuprofen, tramadol, maybe an opioid in the ED Past abortive triptans:  none Past abortive ergotamine:  none Past muscle relaxants:  Flexeril Past anti-emetic:  Zofran, Reglan, Phenergan Past antihypertensive medications:  clonidine Past antidepressant medications:  sertraline Past anticonvulsant medications:  Depakote Past anti-CGRP:  none Past vitamins/Herbal/Supplements:  none Past antihistamines/decongestants:  Benadryl, Sudafed, Flonase Other past therapies:  none    Family history of headache: Mom, aunt  PAST MEDICAL HISTORY: Past Medical History:  Diagnosis Date   ADHD (attention deficit hyperactivity disorder)    Anemia    Anxiety    Asthma    Autoimmune hepatitis (HCC)  Autoimmune hepatitis (HCC) 01/02/2017   Bipolar 1 disorder (HCC)    Bipolar 1 disorder, manic, moderate (HCC) 11/09/2014   C. difficile colitis 03/22/2016   C. difficile diarrhea 03/22/2016   Clostridium difficile colitis    Complication of anesthesia    Depression    Family history of adverse reaction to  anesthesia    mother- N/v and headache   GERD (gastroesophageal reflux disease)    Headache    HOH (hard of hearing)    Hypertension    Mental developmental delay    Pneumonia    PONV (postoperative nausea and vomiting)    Pre-diabetes    Seizures (HCC)    Ulcerative colitis (HCC)     MEDICATIONS: Current Outpatient Medications on File Prior to Visit  Medication Sig Dispense Refill   albuterol (PROVENTIL) (2.5 MG/3ML) 0.083% nebulizer solution Inhale 3 mLs (2.5 mg total) into the lungs every 6 (six) hours as needed for wheezing or shortness of breath. 75 mL 3   albuterol (VENTOLIN HFA) 108 (90 Base) MCG/ACT inhaler Inhale 2 puffs into the lungs every 4 (four) hours as needed for wheezing or shortness of breath. 1 each 2   cholecalciferol (VITAMIN D) 1000 units tablet Take 1 tablet (1,000 Units total) by mouth daily. START AFTER VITAMIN D 50,000 U TABLETS ARE COMPLETE (Patient taking differently: Take 1,000 Units by mouth daily.) 30 tablet 11   cholestyramine (QUESTRAN) 4 GM/DOSE powder Take 1 packet (4 g total) by mouth daily. Take 1 hour after morning medications. Do not take any other medication until 4-6 hours after taking. 378 g 12   cyclobenzaprine (FLEXERIL) 10 MG tablet Take 1 tablet (10 mg total) by mouth 2 (two) times daily as needed for muscle spasms (Leg cramps). 30 tablet 1   EPINEPHrine 0.3 mg/0.3 mL IJ SOAJ injection Inject 0.3 mg into the muscle once as needed (for severe allergic reaction). 1 each 1   fluticasone (FLONASE) 50 MCG/ACT nasal spray Place 1 spray into both nostrils daily. (Patient taking differently: Place 1 spray into both nostrils daily as needed for allergies.) 16 g 0   lamoTRIgine (LAMICTAL) 150 MG tablet Take 1 tablet (150 mg total) by mouth daily. 30 tablet 1   loratadine (CLARITIN) 10 MG tablet TAKE 1 TABLET(10 MG) BY MOUTH DAILY 90 tablet 1   montelukast (SINGULAIR) 10 MG tablet TAKE 1 TABLET(10 MG) BY MOUTH AT BEDTIME 90 tablet 1   pantoprazole  (PROTONIX) 20 MG tablet TAKE 1 TABLET(20 MG) BY MOUTH DAILY 90 tablet 0   Respiratory Therapy Supplies (NEBULIZER COMPRESSOR) KIT And supplies 1 each 0   SUMAtriptan (IMITREX) 100 MG tablet Take 1 tablet earliest onset of headache.  May repeat in 2 hours if headache persists or recurs.  Maximum 2 tablets in 24 hours. 10 tablet 5   No current facility-administered medications on file prior to visit.    ALLERGIES: Allergies  Allergen Reactions   Nsaids Other (See Comments)    Should not take due to GI condition   Other Other (See Comments)    Should not take 2/2 GI condition Other reaction(s): Other (See Comments) Should not take due to GI condition   Pineapple Swelling and Other (See Comments)    Reaction:  Lip swelling   Remicade [Infliximab] Other (See Comments)    AUTOIMMUNE HEPATITIS   Strawberry Extract Swelling    Reaction:  Lip swelling   Amoxicillin-Pot Clavulanate Nausea And Vomiting and Other (See Comments)    GI intolerance  Omeprazole Nausea And Vomiting   Tomato Rash    FAMILY HISTORY: Family History  Problem Relation Age of Onset   Asthma Mother    Ulcers Mother    Bipolar disorder Mother    ADD / ADHD Father    Ulcerative colitis Maternal Aunt    Cancer Maternal Aunt        Leukemia   Ulcerative colitis Paternal Uncle    Colon polyps Maternal Grandmother    Diabetes Maternal Grandmother    Hypertension Maternal Grandmother    Sleep apnea Maternal Grandmother    Heart disease Maternal Grandmother    Diabetes Maternal Grandfather    Hypertension Maternal Grandfather    Sleep apnea Maternal Grandfather    Heart disease Maternal Grandfather    Cancer Maternal Grandfather        prostate   Colon cancer Neg Hx    Liver disease Neg Hx       Objective:  *** General: No acute distress.  Patient appears well-groomed.   Head:  Normocephalic/atraumatic Eyes:  Fundi examined but not visualized Neck: supple, no paraspinal tenderness, full range of  motion Neurological Exam: alert and oriented.  Speech fluent and not dysarthric, language intact.  CN II-XII intact. Bulk and tone normal, muscle strength 5/5 throughout.  Sensation to light touch intact.  Deep tendon reflexes 2+ throughout.  Finger to nose testing intact.  Gait normal, Romberg negative.   Shon Millet, DO  CC: Trena Platt, MD

## 2023-03-18 ENCOUNTER — Ambulatory Visit: Payer: MEDICAID | Admitting: Neurology

## 2023-04-09 ENCOUNTER — Emergency Department (HOSPITAL_COMMUNITY)
Admission: EM | Admit: 2023-04-09 | Discharge: 2023-04-09 | Discharge status: Against Medical Advice | Payer: MEDICAID | Attending: Emergency Medicine | Admitting: Emergency Medicine

## 2023-04-09 ENCOUNTER — Other Ambulatory Visit: Payer: Self-pay

## 2023-04-09 ENCOUNTER — Encounter (HOSPITAL_COMMUNITY): Payer: Self-pay

## 2023-04-09 DIAGNOSIS — R059 Cough, unspecified: Secondary | ICD-10-CM | POA: Insufficient documentation

## 2023-04-09 DIAGNOSIS — R0981 Nasal congestion: Secondary | ICD-10-CM | POA: Diagnosis not present

## 2023-04-09 DIAGNOSIS — Z5321 Procedure and treatment not carried out due to patient leaving prior to being seen by health care provider: Secondary | ICD-10-CM | POA: Diagnosis not present

## 2023-04-09 NOTE — ED Triage Notes (Signed)
Pt states he has been sick since Monday. Pt states he has a productive cough, nasal congestion.

## 2023-04-10 ENCOUNTER — Emergency Department
Admit: 2023-04-10 | Discharge: 2023-04-11 | Disposition: A | Discharge status: Home / Self Care | Payer: PRIVATE HEALTH INSURANCE | Attending: Emergency Medicine

## 2023-04-10 ENCOUNTER — Ambulatory Visit
Admit: 2023-04-10 | Discharge: 2023-04-11 | Disposition: A | Discharge status: Home / Self Care | Payer: PRIVATE HEALTH INSURANCE | Attending: Emergency Medicine

## 2023-04-10 DIAGNOSIS — J4520 Mild intermittent asthma, uncomplicated: Principal | ICD-10-CM

## 2023-04-10 DIAGNOSIS — J069 Acute upper respiratory infection, unspecified: Principal | ICD-10-CM

## 2023-04-10 MED ORDER — PREDNISONE 20 MG TABLET
ORAL_TABLET | Freq: Every day | ORAL | 0 refills | 4 days | Status: CP
Start: 2023-04-10 — End: 2023-04-14

## 2023-05-10 ENCOUNTER — Other Ambulatory Visit: Payer: Self-pay | Admitting: Gastroenterology

## 2023-05-10 DIAGNOSIS — K219 Gastro-esophageal reflux disease without esophagitis: Secondary | ICD-10-CM

## 2023-07-27 ENCOUNTER — Ambulatory Visit: Payer: MEDICAID | Admitting: Internal Medicine

## 2023-08-06 ENCOUNTER — Encounter: Payer: Self-pay | Admitting: Internal Medicine

## 2023-08-06 ENCOUNTER — Ambulatory Visit (INDEPENDENT_AMBULATORY_CARE_PROVIDER_SITE_OTHER): Payer: MEDICAID | Admitting: Internal Medicine

## 2023-08-06 VITALS — BP 109/61 | HR 60 | Ht 68.5 in | Wt 231.0 lb

## 2023-08-06 DIAGNOSIS — E538 Deficiency of other specified B group vitamins: Secondary | ICD-10-CM | POA: Diagnosis not present

## 2023-08-06 DIAGNOSIS — Z23 Encounter for immunization: Secondary | ICD-10-CM | POA: Diagnosis not present

## 2023-08-06 DIAGNOSIS — F3162 Bipolar disorder, current episode mixed, moderate: Secondary | ICD-10-CM | POA: Diagnosis not present

## 2023-08-06 DIAGNOSIS — Z0001 Encounter for general adult medical examination with abnormal findings: Secondary | ICD-10-CM

## 2023-08-06 DIAGNOSIS — K51019 Ulcerative (chronic) pancolitis with unspecified complications: Secondary | ICD-10-CM | POA: Diagnosis not present

## 2023-08-06 DIAGNOSIS — E559 Vitamin D deficiency, unspecified: Secondary | ICD-10-CM | POA: Diagnosis not present

## 2023-08-06 DIAGNOSIS — J4520 Mild intermittent asthma, uncomplicated: Secondary | ICD-10-CM

## 2023-08-06 DIAGNOSIS — K219 Gastro-esophageal reflux disease without esophagitis: Secondary | ICD-10-CM

## 2023-08-06 MED ORDER — FAMOTIDINE 20 MG PO TABS: 20.0000 mg | ORAL_TABLET | Freq: Every day | ORAL | 1 refills | Status: AC

## 2023-08-06 NOTE — Patient Instructions (Signed)
 Please start taking Pepcid instead of Singulair.  Please contact [facility] and your GI provider for follow up.  Please continue to take medications as prescribed.  Please continue to follow low carb diet and perform moderate exercise/walking at least 150 mins/week.

## 2023-08-06 NOTE — Progress Notes (Unsigned)
 Acute Office Visit  Subjective:    Patient ID: Wayne Avila, male    DOB: June 21, 1990, 33 y.o.   MRN: [id number]  Chief Complaint  Patient presents with   Care Management    6 month f/u, mom is with pt, reports concerns about anal bleeding ongoing for about 3 months or longer, also reports sx of fatigue off and on.     HPI Patient is in today for follow-up of bipolar disorder.  He was placed on Lamictal by Dr. Adrian Blackwater, but has lost follow-up with him.  He denied any major change in mood symptoms and anger issues with Lamictal.  He still has apathy, anger bursts and insomnia.  He requests referral to psychiatric clinic.  He denies any SI or HI currently.  Past Medical History:  Diagnosis Date   ADHD (attention deficit hyperactivity disorder)    Anemia    Anxiety    Asthma    Autoimmune hepatitis (HCC)    Autoimmune hepatitis (HCC) 01/02/2017   Bipolar 1 disorder (HCC)    Bipolar 1 disorder, manic, moderate (HCC) 11/09/2014   C. difficile colitis 03/22/2016   C. difficile diarrhea 03/22/2016   Clostridium difficile colitis    Complication of anesthesia    Depression    Family history of adverse reaction to anesthesia    mother- N/v and headache   GERD (gastroesophageal reflux disease)    Headache    HOH (hard of hearing)    Hypertension    Mental developmental delay    Pneumonia    PONV (postoperative nausea and vomiting)    Pre-diabetes    Seizures (HCC)    Ulcerative colitis (HCC)     Past Surgical History:  Procedure Laterality Date   ADENOIDECTOMY     BIOPSY N/A 12/07/2012   Procedure: GASTRIC BIOPSIES;  Surgeon: West Bali, MD;  Location: [facility];  Service: Endoscopy;  Laterality: N/A;   BIOPSY N/A 08/30/2013   Procedure: BIOPSY;  Surgeon: West Bali, MD;  Location: [facility];  Service: Endoscopy;  Laterality: N/A;  right colon,transverse colon, left colon, rectal biopsies   BIOPSY  11/20/2015   Procedure: BIOPSY;  Surgeon: West Bali, MD;  Location:  [facility];  Service: Endoscopy;;  ileal, right colon biopsy, left colon, rectum   BIOPSY  04/18/2016   Procedure: BIOPSY;  Surgeon: Corbin Ade, MD;  Location: [facility];  Service: Endoscopy;;  left descending colon biopsies   CHOLECYSTECTOMY N/A 01/29/2021   Procedure: LAPAROSCOPIC CHOLECYSTECTOMY WITH INTRAOPERATIVE CHOLANGIOGRAM AND ICG GYE , POSSIBLE OPEN;  Surgeon: Gaynelle Adu, MD;  Location: [facility];  Service: General;  Laterality: N/A;   COLON SURGERY     ileostomy   COLONOSCOPY  11/20/2015   Dr. Darrick Penna: Severe erythema, edema, ulcers from the anal verge to 20 cm above the anal verge without mucosal sparing, remaining colon and terminal ileum appeared normal. Biopsies from the rectum revealed fulminant active chronic colitis consistent with IBD. Pathology from terminal ileum revealed intramucosal lymphoid aggregates. Remaining colon random biopsies with inactive chronic colitis   COLONOSCOPY WITH PROPOFOL N/A 08/30/2013   JXB:JYNWGN mucosa in the terminal ileum/COLITIS/ MILD PROCTITIS. 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   COLONOSCOPY WITH PROPOFOL N/A 11/20/2015   Dr. Darrick Penna: chronic inactive pancolitis and active severe ulcerative proctitis    COLOPROCTECTOMY W/ ILEO J POUCH     ESOPHAGOGASTRODUODENOSCOPY  11/20/2015  Dr. Darrick Penna: Normal exam, stomach biopsied and duodenal biopsy.. Benign biopsies.   ESOPHAGOGASTRODUODENOSCOPY (EGD) WITH PROPOFOL N/A 12/07/2012   SLF:The mucosa of the esophagus appeared normal Non-erosive gastritis (inflammation) was found in the gastric antrum; multiple biopsies The duodenal mucosa showed no abnormalities in the bulb and second portion of the duodenum   ESOPHAGOGASTRODUODENOSCOPY (EGD) WITH PROPOFOL N/A 11/20/2015   Dr. Darrick Penna: normal with normal biopsies, negative H.pylori    ESOPHAGOGASTRODUODENOSCOPY (EGD) WITH PROPOFOL N/A  04/18/2016   Procedure: ESOPHAGOGASTRODUODENOSCOPY (EGD) WITH PROPOFOL;  Surgeon: Corbin Ade, MD;  Location: [facility];  Service: Endoscopy;  Laterality: N/A;   FLEXIBLE SIGMOIDOSCOPY N/A 04/18/2016   Procedure: FLEXIBLE SIGMOIDOSCOPY;  Surgeon: Corbin Ade, MD;  Location: [facility];  Service: Endoscopy;  Laterality: N/A;   LYSIS OF ADHESION N/A 01/29/2021   Procedure: LYSIS OF ADHESION;  Surgeon: Gaynelle Adu, MD;  Location: [facility];  Service: General;  Laterality: N/A;   TONSILLECTOMY      Family History  Problem Relation Age of Onset   Asthma Mother    Ulcers Mother    Bipolar disorder Mother    ADD / ADHD Father    Ulcerative colitis Maternal Aunt    Cancer Maternal Aunt        Leukemia   Ulcerative colitis Paternal Uncle    Colon polyps Maternal Grandmother    Diabetes Maternal Grandmother    Hypertension Maternal Grandmother    Sleep apnea Maternal Grandmother    Heart disease Maternal Grandmother    Diabetes Maternal Grandfather    Hypertension Maternal Grandfather    Sleep apnea Maternal Grandfather    Heart disease Maternal Grandfather    Cancer Maternal Grandfather        prostate   Colon cancer Neg Hx    Liver disease Neg Hx     Social History   Socioeconomic History   Marital status: Single    Spouse name: Not on file   Number of children: 1   Years of education: 12th   Highest education level: 12th grade  Occupational History    Employer: NOT EMPLOYED    Comment: disabled  Tobacco Use   Smoking status: Former    Current packs/day: 0.00    Types: Cigarettes    Quit date: 10/07/2010    Years since quitting: 12.8   Smokeless tobacco: Never   Tobacco comments:    Quit sometime after his second psychiatry appointment in early 2024  Vaping Use   Vaping status: Never Used  Substance and Sexual Activity   Alcohol use: Not Currently    Comment: 1 beer on special occasions   Drug use: Not Currently    Types: Marijuana    Comment: Stopped  after initial psychiatry appointment.  Rolled blunt at night to help him sleep will also use when getting irritable.  Started week of 06/23/22   Sexual activity: Yes    Birth control/protection: None  Other Topics Concern   Not on file  Social History Narrative   Lives with mother   Gearldine Shown and Celine Ahr in the home.   Caffeine Use: occasionally   Diet: eats all food groups   Water: 6-8 cups daily       Likes to game late and sleep in until noon   [facility]      Does not drive   Wears seat belt    Smoke detectors    No weapons   Right handed  Social Drivers of Corporate investment banker Strain: Low Risk  (08/05/2023)   Overall Financial Resource Strain (CARDIA)    Difficulty of Paying Living Expenses: Not hard at all  Food Insecurity: Food Insecurity Present (08/05/2023)   Hunger Vital Sign    Worried About Running Out of Food in the Last Year: Sometimes true    Ran Out of Food in the Last Year: Sometimes true  Transportation Needs: No Transportation Needs (08/05/2023)   PRAPARE - Administrator, Civil Service (Medical): No    Lack of Transportation (Non-Medical): No  Physical Activity: Unknown (08/05/2023)   Exercise Vital Sign    Days of Exercise per Week: 0 days    Minutes of Exercise per Session: Not on file  Stress: Stress Concern Present (08/05/2023)   [employer] of Occupational Health - Occupational Stress Questionnaire    Feeling of Stress : Very much  Social Connections: Moderately Integrated (08/05/2023)   Social Connection and Isolation Panel [NHANES]    Frequency of Communication with Friends and Family: More than three times a week    Frequency of Social Gatherings with Friends and Family: More than three times a week    Attends Religious Services: 1 to 4 times per year    Active Member of [employer] or Organizations: Yes    Attends Banker Meetings: 1 to 4 times per year    Marital Status: Separated  Intimate  Partner Violence: Not At Risk (06/12/2020)   Humiliation, Afraid, Rape, and Kick questionnaire    Fear of Current or Ex-Partner: No    Emotionally Abused: No    Physically Abused: No    Sexually Abused: No    Outpatient Medications Prior to Visit  Medication Sig Dispense Refill   albuterol (PROVENTIL) (2.5 MG/3ML) 0.083% nebulizer solution Inhale 3 mLs (2.5 mg total) into the lungs every 6 (six) hours as needed for wheezing or shortness of breath. 75 mL 3   albuterol (VENTOLIN HFA) 108 (90 Base) MCG/ACT inhaler Inhale 2 puffs into the lungs every 4 (four) hours as needed for wheezing or shortness of breath. 1 each 2   cholecalciferol (VITAMIN D) 1000 units tablet Take 1 tablet (1,000 Units total) by mouth daily. START AFTER VITAMIN D 50,000 U TABLETS ARE COMPLETE (Patient taking differently: Take 1,000 Units by mouth daily.) 30 tablet 11   cholestyramine (QUESTRAN) 4 GM/DOSE powder Take 1 packet (4 g total) by mouth daily. Take 1 hour after morning medications. Do not take any other medication until 4-6 hours after taking. 378 g 12   cyclobenzaprine (FLEXERIL) 10 MG tablet Take 1 tablet (10 mg total) by mouth 2 (two) times daily as needed for muscle spasms (Leg cramps). 30 tablet 1   EPINEPHrine 0.3 mg/0.3 mL IJ SOAJ injection Inject 0.3 mg into the muscle once as needed (for severe allergic reaction). 1 each 1   fluticasone (FLONASE) 50 MCG/ACT nasal spray Place 1 spray into both nostrils daily. (Patient taking differently: Place 1 spray into both nostrils daily as needed for allergies.) 16 g 0   loratadine (CLARITIN) 10 MG tablet TAKE 1 TABLET(10 MG) BY MOUTH DAILY 90 tablet 1   montelukast (SINGULAIR) 10 MG tablet TAKE 1 TABLET(10 MG) BY MOUTH AT BEDTIME 90 tablet 1   pantoprazole (PROTONIX) 20 MG tablet TAKE 1 TABLET(20 MG) BY MOUTH DAILY 90 tablet 0   Respiratory Therapy Supplies (NEBULIZER COMPRESSOR) KIT And supplies 1 each 0   SUMAtriptan (IMITREX) 100 MG tablet Take  1 tablet earliest  onset of headache.  May repeat in 2 hours if headache persists or recurs.  Maximum 2 tablets in 24 hours. 10 tablet 5   lamoTRIgine (LAMICTAL) 150 MG tablet Take 1 tablet (150 mg total) by mouth daily. 30 tablet 1   No facility-administered medications prior to visit.    Allergies  Allergen Reactions   Nsaids Other (See Comments)    Should not take due to GI condition   Other Other (See Comments)    Should not take 2/2 GI condition Other reaction(s): Other (See Comments) Should not take due to GI condition   Pineapple Swelling and Other (See Comments)    Reaction:  Lip swelling   Remicade [Infliximab] Other (See Comments)    AUTOIMMUNE HEPATITIS   Strawberry Extract Swelling    Reaction:  Lip swelling   Amoxicillin-Pot Clavulanate Nausea And Vomiting and Other (See Comments)    GI intolerance    Omeprazole Nausea And Vomiting   Tomato Rash    Review of Systems  Constitutional:  Negative for chills and fever.  HENT:  Negative for congestion and sore throat.   Eyes:  Negative for pain and discharge.  Respiratory:  Negative for cough and shortness of breath.   Cardiovascular:  Negative for chest pain and palpitations.  Gastrointestinal:  Negative for constipation, diarrhea, nausea and vomiting.  Endocrine: Negative for polydipsia and polyuria.  Genitourinary:  Negative for dysuria and hematuria.  Musculoskeletal:  Negative for neck pain and neck stiffness.  Skin:  Negative for rash.  Neurological:  Positive for headaches. Negative for dizziness, weakness and numbness.  Psychiatric/Behavioral:  Positive for agitation, decreased concentration and sleep disturbance. The patient is nervous/anxious.        Objective:    Physical Exam Vitals reviewed.  Constitutional:      General: He is not in acute distress.    Appearance: He is not diaphoretic.  HENT:     Head: Normocephalic and atraumatic.     Nose: Nose normal.     Mouth/Throat:     Mouth: Mucous membranes are moist.   Eyes:     General: No scleral icterus.    Extraocular Movements: Extraocular movements intact.  Cardiovascular:     Rate and Rhythm: Normal rate and regular rhythm.     Heart sounds: Normal heart sounds. No murmur heard. Pulmonary:     Breath sounds: Normal breath sounds. No wheezing or rales.  Musculoskeletal:     Cervical back: Neck supple. No tenderness.     Right lower leg: No edema.     Left lower leg: No edema.  Skin:    General: Skin is warm.     Findings: No rash.  Neurological:     General: No focal deficit present.     Mental Status: He is alert and oriented to person, place, and time.     Sensory: No sensory deficit.     Motor: No weakness.  Psychiatric:        Mood and Affect: Mood is depressed.        Behavior: Behavior is slowed.        Thought Content: Thought content does not include homicidal or suicidal ideation.     BP 109/61   Pulse 60   Ht 5' 8.5" (1.74 m)   Wt 231 lb (104.8 kg)   SpO2 97%   BMI 34.61 kg/m  Wt Readings from Last 3 Encounters:  08/06/23 231 lb (104.8 kg)  04/09/23 228 lb (  103.4 kg)  01/21/23 236 lb 9.6 oz (107.3 kg)        Assessment & Plan:   Problem List Items Addressed This Visit   None     No orders of the defined types were placed in this encounter.    Anabel Halon, MD

## 2023-08-07 DIAGNOSIS — K219 Gastro-esophageal reflux disease without esophagitis: Secondary | ICD-10-CM | POA: Insufficient documentation

## 2023-08-07 LAB — CBC WITH DIFFERENTIAL/PLATELET
Basophils Absolute: 0 10*3/uL (ref 0.0–0.2)
Basos: 0 %
EOS (ABSOLUTE): 0.1 10*3/uL (ref 0.0–0.4)
Eos: 1 %
Hematocrit: 44 % (ref 37.5–51.0)
Hemoglobin: 15 g/dL (ref 13.0–17.7)
Immature Grans (Abs): 0 10*3/uL (ref 0.0–0.1)
Immature Granulocytes: 0 %
Lymphocytes Absolute: 1.9 10*3/uL (ref 0.7–3.1)
Lymphs: 26 %
MCH: 29.1 pg (ref 26.6–33.0)
MCHC: 34.1 g/dL (ref 31.5–35.7)
MCV: 85 fL (ref 79–97)
Monocytes Absolute: 0.5 10*3/uL (ref 0.1–0.9)
Monocytes: 7 %
Neutrophils Absolute: 4.7 10*3/uL (ref 1.4–7.0)
Neutrophils: 66 %
Platelets: 216 10*3/uL (ref 150–450)
RBC: 5.16 x10E6/uL (ref 4.14–5.80)
RDW: 13.2 % (ref 11.6–15.4)
WBC: 7.2 10*3/uL (ref 3.4–10.8)

## 2023-08-07 LAB — CMP14+EGFR
ALT: 14 IU/L (ref 0–44)
AST: 18 IU/L (ref 0–40)
Albumin: 4.4 g/dL (ref 4.1–5.1)
Alkaline Phosphatase: 69 IU/L (ref 44–121)
BUN/Creatinine Ratio: 10 (ref 9–20)
BUN: 9 mg/dL (ref 6–20)
Bilirubin Total: 0.4 mg/dL (ref 0.0–1.2)
CO2: 20 mmol/L (ref 20–29)
Calcium: 10 mg/dL (ref 8.7–10.2)
Chloride: 107 mmol/L — ABNORMAL HIGH (ref 96–106)
Creatinine, Ser: 0.94 mg/dL (ref 0.76–1.27)
Globulin, Total: 2.4 g/dL (ref 1.5–4.5)
Glucose: 88 mg/dL (ref 70–99)
Potassium: 4 mmol/L (ref 3.5–5.2)
Sodium: 142 mmol/L (ref 134–144)
Total Protein: 6.8 g/dL (ref 6.0–8.5)
eGFR: 111 mL/min/{1.73_m2} (ref >59)

## 2023-08-07 LAB — C-REACTIVE PROTEIN: CRP: 2 mg/L (ref 0–10)

## 2023-08-07 LAB — TSH+FREE T4
Free T4: 0.9 ng/dL (ref 0.82–1.77)
TSH: 1.04 u[IU]/mL (ref 0.450–4.500)

## 2023-08-07 LAB — VITAMIN B12: Vitamin B-12: 350 pg/mL (ref 232–1245)

## 2023-08-07 LAB — VITAMIN D 25 HYDROXY (VIT D DEFICIENCY, FRACTURES): Vit D, 25-Hydroxy: 32.9 ng/mL (ref 30.0–100.0)

## 2023-08-07 NOTE — Assessment & Plan Note (Signed)
 Overall well controlled with pantoprazole Added Pepcid for allergies - can also help with residual acid reflux

## 2023-08-07 NOTE — Assessment & Plan Note (Signed)
 Physical exam as documented. Blood tests ordered.

## 2023-08-07 NOTE — Assessment & Plan Note (Signed)
 Well controlled with as needed albuterol On Singulair and Claritin for allergies Due to history of mood disorders, discontinue Singulair Added Pepcid for allergies

## 2023-08-07 NOTE — Assessment & Plan Note (Signed)
 S/p colectomy Followed by GI Currently does not have melena or hematochezia Diarrhea improved with Questran, but noncompliant

## 2023-08-07 NOTE — Assessment & Plan Note (Addendum)
 Last vitamin D Lab Results  Component Value Date   VD25OH 32.9 08/06/2023   Advised to continue Vitamin D 2000 IU QD

## 2023-08-07 NOTE — Assessment & Plan Note (Signed)
 Had discussion with psychiatrist in the past Was placed on Lamictal, but did not continue as he lost follow-up Referred to [facility] behavioral health clinic - needs to schedule appointment

## 2023-08-15 NOTE — Progress Notes (Unsigned)
 GI Office Note    Referring Provider: Anabel Halon, MD Primary Care Physician:  Anabel Halon, MD  Primary Gastroenterologist: Hennie Duos. Marletta Lor, DO   Chief Complaint   No chief complaint on file.   History of Present Illness   Wayne Avila is a 33 y.o. male presenting today for follow up 09/2022. He has h/o GERD, ulcerative pancolitis with severe refractory disease unresponsive to medical therapy, seizure disorder, ADHA, developmental delay.    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 He is now s/p total colectomy in May 2020 at [facility] , followed by completion proctectomy with J-pouch creation in Sept 2020 which was complicated by a contained bowel perforation managed with percutaneous drains, antibiotics and TPN. He eventually recovered from his surgery.  He was followed by Dr. Epifania Gore at [facility].  His last office visit was in December 2020.  He continued to have some looser stools and was started on Imodium, Bentyl, and a barrier cream to his perianal area.  Was doing well at that time.  Reportedly was eating well and having a good amount of energy.   Labs 07/01/22: Vitamin D 25.9, TSH 0.949, B12 375, Hgb 15.3, plts 214, CMP unremarkable other than mildly low glucose.     Last flu shot:07/01/22 Last pneumonia shot:2017 (PSV 23) 2019 (PSV 13) Last zoster vaccine: N/A COVID-19 shot: Moderna vaccine in 2021   Colonoscopy June 2017: -Abnormal DRE (severe erythema, edema, and ulcers from the anal verge to about 20 cm above the anal verge) -fulminant chronic active colitis consistent with IBD -TI normal s/p biopsy (intramucosal lymphoid aggregates) -Entire colon normal, s/p biopsy (chronic inactive colitis) -Diffuse area of granular mucosa found in the rectum -Advised Apriso and enemas or a biologic agent.  Advised to avoid NSAIDs and tobacco -Advised repeat colonoscopy in 10 years for surveillance   EGD June 2017: -Normal esophagus -Normal stomach s/p  biopsy -Normal duodenum s/p biopsy -Biopsies benign    Medications   Current Outpatient Medications  Medication Sig Dispense Refill   albuterol (PROVENTIL) (2.5 MG/3ML) 0.083% nebulizer solution Inhale 3 mLs (2.5 mg total) into the lungs every 6 (six) hours as needed for wheezing or shortness of breath. 75 mL 3   albuterol (VENTOLIN HFA) 108 (90 Base) MCG/ACT inhaler Inhale 2 puffs into the lungs every 4 (four) hours as needed for wheezing or shortness of breath. 1 each 2   cholecalciferol (VITAMIN D) 1000 units tablet Take 1 tablet (1,000 Units total) by mouth daily. START AFTER VITAMIN D 50,000 U TABLETS ARE COMPLETE (Patient taking differently: Take 1,000 Units by mouth daily.) 30 tablet 11   cholestyramine (QUESTRAN) 4 GM/DOSE powder Take 1 packet (4 g total) by mouth daily. Take 1 hour after morning medications. Do not take any other medication until 4-6 hours after taking. 378 g 12   cyclobenzaprine (FLEXERIL) 10 MG tablet Take 1 tablet (10 mg total) by mouth 2 (two) times daily as needed for muscle spasms (Leg cramps). 30 tablet 1   EPINEPHrine 0.3 mg/0.3 mL IJ SOAJ injection Inject 0.3 mg into the muscle once as needed (for severe allergic reaction). 1 each 1   famotidine (PEPCID) 20 MG tablet Take 1 tablet (20 mg total) by mouth daily. 90 tablet 1   fluticasone (FLONASE) 50 MCG/ACT nasal spray Place 1 spray into both nostrils daily. (Patient taking differently: Place 1 spray into both nostrils daily as needed for allergies.) 16 g 0   loratadine (CLARITIN) 10 MG tablet  TAKE 1 TABLET(10 MG) BY MOUTH DAILY 90 tablet 1   pantoprazole (PROTONIX) 20 MG tablet TAKE 1 TABLET(20 MG) BY MOUTH DAILY 90 tablet 0   Respiratory Therapy Supplies (NEBULIZER COMPRESSOR) KIT And supplies 1 each 0   SUMAtriptan (IMITREX) 100 MG tablet Take 1 tablet earliest onset of headache.  May repeat in 2 hours if headache persists or recurs.  Maximum 2 tablets in 24 hours. 10 tablet 5   No current  facility-administered medications for this visit.    Allergies   Allergies as of 08/17/2023 - Review Complete 08/06/2023  Allergen Reaction Noted   Nsaids Other (See Comments) 01/09/2017   Other Other (See Comments) 01/09/2017   Pineapple Swelling and Other (See Comments) 04/14/2011   Remicade [infliximab] Other (See Comments) 01/02/2017   Strawberry extract Swelling 05/08/2012   Amoxicillin-pot clavulanate Nausea And Vomiting and Other (See Comments) 02/28/2011   Omeprazole Nausea And Vomiting 04/20/2013   Tomato Rash 04/14/2011     Past Medical History   Past Medical History:  Diagnosis Date   ADHD (attention deficit hyperactivity disorder)    Anemia    Anxiety    Asthma    Autoimmune hepatitis (HCC)    Autoimmune hepatitis (HCC) 01/02/2017   Bipolar 1 disorder (HCC)    Bipolar 1 disorder, manic, moderate (HCC) 11/09/2014   C. difficile colitis 03/22/2016   C. difficile diarrhea 03/22/2016   Clostridium difficile colitis    Complication of anesthesia    Depression    Family history of adverse reaction to anesthesia    mother- N/v and headache   GERD (gastroesophageal reflux disease)    Headache    HOH (hard of hearing)    Hypertension    Mental developmental delay    Pneumonia    PONV (postoperative nausea and vomiting)    Pre-diabetes    Seizures (HCC)    Ulcerative colitis (HCC)     Past Surgical History   Past Surgical History:  Procedure Laterality Date   ADENOIDECTOMY     BIOPSY N/A 12/07/2012   Procedure: GASTRIC BIOPSIES;  Surgeon: West Bali, MD;  Location: [facility];  Service: Endoscopy;  Laterality: N/A;   BIOPSY N/A 08/30/2013   Procedure: BIOPSY;  Surgeon: West Bali, MD;  Location: [facility];  Service: Endoscopy;  Laterality: N/A;  right colon,transverse colon, left colon, rectal biopsies   BIOPSY  11/20/2015   Procedure: BIOPSY;  Surgeon: West Bali, MD;  Location: [facility];  Service: Endoscopy;;  ileal, right colon biopsy, left  colon, rectum   BIOPSY  04/18/2016   Procedure: BIOPSY;  Surgeon: Corbin Ade, MD;  Location: [facility];  Service: Endoscopy;;  left descending colon biopsies   CHOLECYSTECTOMY N/A 01/29/2021   Procedure: LAPAROSCOPIC CHOLECYSTECTOMY WITH INTRAOPERATIVE CHOLANGIOGRAM AND ICG GYE , POSSIBLE OPEN;  Surgeon: Gaynelle Adu, MD;  Location: [facility];  Service: General;  Laterality: N/A;   COLON SURGERY     ileostomy   COLONOSCOPY  11/20/2015   Dr. Darrick Penna: Severe erythema, edema, ulcers from the anal verge to 20 cm above the anal verge without mucosal sparing, remaining colon and terminal ileum appeared normal. Biopsies from the rectum revealed fulminant active chronic colitis consistent with IBD. Pathology from terminal ileum revealed intramucosal lymphoid aggregates. Remaining colon random biopsies with inactive chronic colitis   COLONOSCOPY WITH PROPOFOL N/A 08/30/2013   NWG:NFAOZH mucosa in the terminal ileum/COLITIS/ MILD PROCTITIS. Biopsies showed patchy chronic active colitis of the right colon and rectum, overall  findings most consistent with idiopathic inflammatory bowel disease.   COLONOSCOPY WITH PROPOFOL N/A 11/20/2015   Dr. Darrick Penna: chronic inactive pancolitis and active severe ulcerative proctitis    COLOPROCTECTOMY W/ ILEO J POUCH     ESOPHAGOGASTRODUODENOSCOPY  11/20/2015   Dr. Darrick Penna: Normal exam, stomach biopsied and duodenal biopsy.. Benign biopsies.   ESOPHAGOGASTRODUODENOSCOPY (EGD) WITH PROPOFOL N/A 12/07/2012   SLF:The mucosa of the esophagus appeared normal Non-erosive gastritis (inflammation) was found in the gastric antrum; multiple biopsies The duodenal mucosa showed no abnormalities in the bulb and second portion of the duodenum   ESOPHAGOGASTRODUODENOSCOPY (EGD) WITH PROPOFOL N/A 11/20/2015   Dr. Darrick Penna: normal with normal biopsies, negative H.pylori    ESOPHAGOGASTRODUODENOSCOPY (EGD) WITH PROPOFOL N/A 04/18/2016   Procedure: ESOPHAGOGASTRODUODENOSCOPY (EGD) WITH  PROPOFOL;  Surgeon: Corbin Ade, MD;  [facility]: [facility];  Service: Endoscopy;  Laterality: N/A;   FLEXIBLE SIGMOIDOSCOPY N/A 04/18/2016   Procedure: FLEXIBLE SIGMOIDOSCOPY;  Surgeon: Corbin Ade, MD;  Location: [facility];  Service: Endoscopy;  Laterality: N/A;   LYSIS OF ADHESION N/A 01/29/2021   Procedure: LYSIS OF ADHESION;  Surgeon: Gaynelle Adu, MD;  Location: [facility];  Service: General;  Laterality: N/A;   TONSILLECTOMY      Past Family History   Family History  Problem Relation Age of Onset   Asthma Mother    Ulcers Mother    Bipolar disorder Mother    ADD / ADHD Father    Ulcerative colitis Maternal Aunt    Cancer Maternal Aunt        Leukemia   Ulcerative colitis Paternal Uncle    Colon polyps Maternal Grandmother    Diabetes Maternal Grandmother    Hypertension Maternal Grandmother    Sleep apnea Maternal Grandmother    Heart disease Maternal Grandmother    Diabetes Maternal Grandfather    Hypertension Maternal Grandfather    Sleep apnea Maternal Grandfather    Heart disease Maternal Grandfather    Cancer Maternal Grandfather        prostate   Colon cancer Neg Hx    Liver disease Neg Hx     Past Social History   Social History   Socioeconomic History   Marital status: Single    Spouse name: Not on file   Number of children: 1   Years of education: 12th   Highest education level: 12th grade  Occupational History    Employer: NOT EMPLOYED    Comment: disabled  Tobacco Use   Smoking status: Former    Current packs/day: 0.00    Types: Cigarettes    Quit date: 10/07/2010    Years since quitting: 12.8   Smokeless tobacco: Never   Tobacco comments:    Quit sometime after his second psychiatry appointment in early 2024  Vaping Use   Vaping status: Never Used  Substance and Sexual Activity   Alcohol use: Not Currently    Comment: 1 beer on special occasions   Drug use: Not Currently    Types: Marijuana    Comment: Stopped after initial  psychiatry appointment.  Rolled blunt at night to help him sleep will also use when getting irritable.  Started week of 06/23/22   Sexual activity: Yes    Birth control/protection: None  Other Topics Concern   Not on file  Social History Narrative   Lives with mother   Gearldine Shown and Celine Ahr in the home.   Caffeine Use: occasionally   Diet: eats all food groups   Water: 6-8  cups daily       Likes to game late and sleep in until noon   [facility]      Does not drive   Wears seat belt    Smoke detectors    No weapons   Right handed   Social Drivers of Health   Financial Resource Strain: Low Risk  (08/05/2023)   Overall Financial Resource Strain (CARDIA)    Difficulty of Paying Living Expenses: Not hard at all  Food Insecurity: Food Insecurity Present (08/05/2023)   Hunger Vital Sign    Worried About Running Out of Food in the Last Year: Sometimes true    Ran Out of Food in the Last Year: Sometimes true  Transportation Needs: No Transportation Needs (08/05/2023)   PRAPARE - Administrator, Civil Service (Medical): No    Lack of Transportation (Non-Medical): No  Physical Activity: Unknown (08/05/2023)   Exercise Vital Sign    Days of Exercise per Week: 0 days    Minutes of Exercise per Session: Not on file  Stress: Stress Concern Present (08/05/2023)   [employer] of Occupational Health - Occupational Stress Questionnaire    Feeling of Stress : Very much  Social Connections: Moderately Integrated (08/05/2023)   Social Connection and Isolation Panel [NHANES]    Frequency of Communication with Friends and Family: More than three times a week    Frequency of Social Gatherings with Friends and Family: More than three times a week    Attends Religious Services: 1 to 4 times per year    Active Member of [employer] or Organizations: Yes    Attends Banker Meetings: 1 to 4 times per year    Marital Status: Separated  Intimate Partner  Violence: Not At Risk (06/12/2020)   Humiliation, Afraid, Rape, and Kick questionnaire    Fear of Current or Ex-Partner: No    Emotionally Abused: No    Physically Abused: No    Sexually Abused: No    Review of Systems   General: Negative for anorexia, weight loss, fever, chills, fatigue, weakness. ENT: Negative for hoarseness, difficulty swallowing , nasal congestion. CV: Negative for chest pain, angina, palpitations, dyspnea on exertion, peripheral edema.  Respiratory: Negative for dyspnea at rest, dyspnea on exertion, cough, sputum, wheezing.  GI: See history of present illness. GU:  Negative for dysuria, hematuria, urinary incontinence, urinary frequency, nocturnal urination.  Endo: Negative for unusual weight change.     Physical Exam   There were no vitals taken for this visit.   General: Well-nourished, well-developed in no acute distress.  Eyes: No icterus. Mouth: Oropharyngeal mucosa moist and pink , no lesions erythema or exudate. Lungs: Clear to auscultation bilaterally.  Heart: Regular rate and rhythm, no murmurs rubs or gallops.  Abdomen: Bowel sounds are normal, nontender, nondistended, no hepatosplenomegaly or masses,  no abdominal bruits or hernia , no rebound or guarding.  Rectal: ***  Extremities: No lower extremity edema. No clubbing or deformities. Neuro: Alert and oriented x 4   Skin: Warm and dry, no jaundice.   Psych: Alert and cooperative, normal mood and affect.  Labs   Lab Results  Component Value Date   TSH 1.040 08/06/2023   Lab Results  Component Value Date   NA 142 08/06/2023   CL 107 (H) 08/06/2023   K 4.0 08/06/2023   CO2 20 08/06/2023   BUN 9 08/06/2023   CREATININE 0.94 08/06/2023   EGFR 111 08/06/2023  CALCIUM 10.0 08/06/2023   ALBUMIN 4.4 08/06/2023   GLUCOSE 88 08/06/2023   Lab Results  Component Value Date   ALT 14 08/06/2023   AST 18 08/06/2023   ALKPHOS 69 08/06/2023   BILITOT 0.4 08/06/2023   Lab Results  Component  Value Date   VITAMINB12 350 08/06/2023    Imaging Studies   No results found.  Assessment      Ulcerative pancolitis, loose stools: Ulcerative pancolitis diagnosed on colonoscopy in 2017.  Initially treated with Apriso.  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  He began following with [facility] GI surgery, Dr. Caren Griffins and he underwent total colectomy and completion proctectomy with creation of a J-pouch.  This was complicated by bowel perforation for which he had multiple drains placed and briefly had TPN for nutrition.  He eventually recovered from surgery and followed up outpatient. Diarrhea was being managed with Imodium and Bentyl as needed.  Currently without any significant GI issues.  Does have a history of a cholecystectomy in the past and has diarrhea requiring daily.  Denies any melena, BRBPR, abdominal pain, weight loss, lack of appetite.  To help with postprandial diarrhea which I suspect is bile acid.  Cholecystectomy we will cholestyramine 4 g once daily.  Advised on proper administration and to monitor for constipation.   PLAN   ***   Leanna Battles. Melvyn Neth, MHS, PA-C [facility]

## 2023-08-17 ENCOUNTER — Encounter: Payer: Self-pay | Admitting: Gastroenterology

## 2023-08-17 ENCOUNTER — Ambulatory Visit: Payer: MEDICAID | Admitting: Gastroenterology

## 2023-08-17 VITALS — BP 125/81 | HR 65 | Temp 98.3°F | Ht 68.5 in | Wt 231.6 lb

## 2023-08-17 DIAGNOSIS — K51919 Ulcerative colitis, unspecified with unspecified complications: Secondary | ICD-10-CM

## 2023-08-18 NOTE — Progress Notes (Deleted)
 GI Office Note    Referring Provider: Anabel Halon, MD Primary Care Physician:  Anabel Halon, MD  Primary Gastroenterologist: Hennie Duos. Marletta Lor, DO   Chief Complaint   No chief complaint on file.   History of Present Illness   Wayne Avila is a 33 y.o. male presenting today for follow up 09/2022. He has h/o GERD, ulcerative pancolitis with severe refractory disease unresponsive to medical therapy, seizure disorder, ADHA, developmental delay.    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 He is now s/p total colectomy in May 2020 at [facility] , followed by completion proctectomy with J-pouch creation in Sept 2020 which was complicated by a contained bowel perforation managed with percutaneous drains, antibiotics and TPN. He eventually recovered from his surgery.  He was followed by Dr. Epifania Gore at [facility].  His last office visit was in December 2020.  He continued to have some looser stools and was started on Imodium, Bentyl, and a barrier cream to his perianal area.  Was doing well at that time.  Reportedly was eating well and having a good amount of energy.   Labs 07/01/22: Vitamin D 25.9, TSH 0.949, B12 375, Hgb 15.3, plts 214, CMP unremarkable other than mildly low glucose.     Last flu shot:07/01/22 Last pneumonia shot:2017 (PSV 23) 2019 (PSV 13) Last zoster vaccine: N/A COVID-19 shot: Moderna vaccine in 2021   Colonoscopy June 2017: -Abnormal DRE (severe erythema, edema, and ulcers from the anal verge to about 20 cm above the anal verge) -fulminant chronic active colitis consistent with IBD -TI normal s/p biopsy (intramucosal lymphoid aggregates) -Entire colon normal, s/p biopsy (chronic inactive colitis) -Diffuse area of granular mucosa found in the rectum -Advised Apriso and enemas or a biologic agent.  Advised to avoid NSAIDs and tobacco -Advised repeat colonoscopy in 10 years for surveillance   EGD June 2017: -Normal esophagus -Normal stomach s/p  biopsy -Normal duodenum s/p biopsy -Biopsies benign    Medications   Current Outpatient Medications  Medication Sig Dispense Refill   albuterol (PROVENTIL) (2.5 MG/3ML) 0.083% nebulizer solution Inhale 3 mLs (2.5 mg total) into the lungs every 6 (six) hours as needed for wheezing or shortness of breath. 75 mL 3   albuterol (VENTOLIN HFA) 108 (90 Base) MCG/ACT inhaler Inhale 2 puffs into the lungs every 4 (four) hours as needed for wheezing or shortness of breath. 1 each 2   cholecalciferol (VITAMIN D) 1000 units tablet Take 1 tablet (1,000 Units total) by mouth daily. START AFTER VITAMIN D 50,000 U TABLETS ARE COMPLETE (Patient taking differently: Take 1,000 Units by mouth daily.) 30 tablet 11   cholestyramine (QUESTRAN) 4 GM/DOSE powder Take 1 packet (4 g total) by mouth daily. Take 1 hour after morning medications. Do not take any other medication until 4-6 hours after taking. 378 g 12   cyclobenzaprine (FLEXERIL) 10 MG tablet Take 1 tablet (10 mg total) by mouth 2 (two) times daily as needed for muscle spasms (Leg cramps). 30 tablet 1   EPINEPHrine 0.3 mg/0.3 mL IJ SOAJ injection Inject 0.3 mg into the muscle once as needed (for severe allergic reaction). 1 each 1   famotidine (PEPCID) 20 MG tablet Take 1 tablet (20 mg total) by mouth daily. 90 tablet 1   fluticasone (FLONASE) 50 MCG/ACT nasal spray Place 1 spray into both nostrils daily. (Patient taking differently: Place 1 spray into both nostrils daily as needed for allergies.) 16 g 0   loratadine (CLARITIN) 10 MG tablet  TAKE 1 TABLET(10 MG) BY MOUTH DAILY 90 tablet 1   pantoprazole (PROTONIX) 20 MG tablet TAKE 1 TABLET(20 MG) BY MOUTH DAILY 90 tablet 0   Respiratory Therapy Supplies (NEBULIZER COMPRESSOR) KIT And supplies 1 each 0   SUMAtriptan (IMITREX) 100 MG tablet Take 1 tablet earliest onset of headache.  May repeat in 2 hours if headache persists or recurs.  Maximum 2 tablets in 24 hours. 10 tablet 5   No current  facility-administered medications for this visit.    Allergies   Allergies as of 08/17/2023 - Review Complete 08/06/2023  Allergen Reaction Noted   Nsaids Other (See Comments) 01/09/2017   Other Other (See Comments) 01/09/2017   Pineapple Swelling and Other (See Comments) 04/14/2011   Remicade [infliximab] Other (See Comments) 01/02/2017   Strawberry extract Swelling 05/08/2012   Amoxicillin-pot clavulanate Nausea And Vomiting and Other (See Comments) 02/28/2011   Omeprazole Nausea And Vomiting 04/20/2013   Tomato Rash 04/14/2011     Past Medical History   Past Medical History:  Diagnosis Date   ADHD (attention deficit hyperactivity disorder)    Anemia    Anxiety    Asthma    Autoimmune hepatitis (HCC)    Autoimmune hepatitis (HCC) 01/02/2017   Bipolar 1 disorder (HCC)    Bipolar 1 disorder, manic, moderate (HCC) 11/09/2014   C. difficile colitis 03/22/2016   C. difficile diarrhea 03/22/2016   Clostridium difficile colitis    Complication of anesthesia    Depression    Family history of adverse reaction to anesthesia    mother- N/v and headache   GERD (gastroesophageal reflux disease)    Headache    HOH (hard of hearing)    Hypertension    Mental developmental delay    Pneumonia    PONV (postoperative nausea and vomiting)    Pre-diabetes    Seizures (HCC)    Ulcerative colitis (HCC)     Past Surgical History   Past Surgical History:  Procedure Laterality Date   ADENOIDECTOMY     BIOPSY N/A 12/07/2012   Procedure: GASTRIC BIOPSIES;  Surgeon: West Bali, MD;  Location: [facility];  Service: Endoscopy;  Laterality: N/A;   BIOPSY N/A 08/30/2013   Procedure: BIOPSY;  Surgeon: West Bali, MD;  Location: [facility];  Service: Endoscopy;  Laterality: N/A;  right colon,transverse colon, left colon, rectal biopsies   BIOPSY  11/20/2015   Procedure: BIOPSY;  Surgeon: West Bali, MD;  Location: [facility];  Service: Endoscopy;;  ileal, right colon biopsy, left  colon, rectum   BIOPSY  04/18/2016   Procedure: BIOPSY;  Surgeon: Corbin Ade, MD;  Location: [facility];  Service: Endoscopy;;  left descending colon biopsies   CHOLECYSTECTOMY N/A 01/29/2021   Procedure: LAPAROSCOPIC CHOLECYSTECTOMY WITH INTRAOPERATIVE CHOLANGIOGRAM AND ICG GYE , POSSIBLE OPEN;  Surgeon: Gaynelle Adu, MD;  Location: [facility];  Service: General;  Laterality: N/A;   COLON SURGERY     ileostomy   COLONOSCOPY  11/20/2015   Dr. Darrick Penna: Severe erythema, edema, ulcers from the anal verge to 20 cm above the anal verge without mucosal sparing, remaining colon and terminal ileum appeared normal. Biopsies from the rectum revealed fulminant active chronic colitis consistent with IBD. Pathology from terminal ileum revealed intramucosal lymphoid aggregates. Remaining colon random biopsies with inactive chronic colitis   COLONOSCOPY WITH PROPOFOL N/A 08/30/2013   GEX:BMWUXL mucosa in the terminal ileum/COLITIS/ MILD PROCTITIS. Biopsies showed patchy chronic active colitis of the right colon and rectum, overall  findings most consistent with idiopathic inflammatory bowel disease.   COLONOSCOPY WITH PROPOFOL N/A 11/20/2015   Dr. Darrick Penna: chronic inactive pancolitis and active severe ulcerative proctitis    COLOPROCTECTOMY W/ ILEO J POUCH     ESOPHAGOGASTRODUODENOSCOPY  11/20/2015   Dr. Darrick Penna: Normal exam, stomach biopsied and duodenal biopsy.. Benign biopsies.   ESOPHAGOGASTRODUODENOSCOPY (EGD) WITH PROPOFOL N/A 12/07/2012   SLF:The mucosa of the esophagus appeared normal Non-erosive gastritis (inflammation) was found in the gastric antrum; multiple biopsies The duodenal mucosa showed no abnormalities in the bulb and second portion of the duodenum   ESOPHAGOGASTRODUODENOSCOPY (EGD) WITH PROPOFOL N/A 11/20/2015   Dr. Darrick Penna: normal with normal biopsies, negative H.pylori    ESOPHAGOGASTRODUODENOSCOPY (EGD) WITH PROPOFOL N/A 04/18/2016   Procedure: ESOPHAGOGASTRODUODENOSCOPY (EGD) WITH  PROPOFOL;  Surgeon: Corbin Ade, MD;  [facility];  Service: Endoscopy;  Laterality: N/A;   FLEXIBLE SIGMOIDOSCOPY N/A 04/18/2016   Procedure: FLEXIBLE SIGMOIDOSCOPY;  Surgeon: Corbin Ade, MD;  Location: [facility];  Service: Endoscopy;  Laterality: N/A;   LYSIS OF ADHESION N/A 01/29/2021   Procedure: LYSIS OF ADHESION;  Surgeon: Gaynelle Adu, MD;  Location: [facility];  Service: General;  Laterality: N/A;   TONSILLECTOMY      Past Family History   Family History  Problem Relation Age of Onset   Asthma Mother    Ulcers Mother    Bipolar disorder Mother    ADD / ADHD Father    Ulcerative colitis Maternal Aunt    Cancer Maternal Aunt        Leukemia   Ulcerative colitis Paternal Uncle    Colon polyps Maternal Grandmother    Diabetes Maternal Grandmother    Hypertension Maternal Grandmother    Sleep apnea Maternal Grandmother    Heart disease Maternal Grandmother    Diabetes Maternal Grandfather    Hypertension Maternal Grandfather    Sleep apnea Maternal Grandfather    Heart disease Maternal Grandfather    Cancer Maternal Grandfather        prostate   Colon cancer Neg Hx    Liver disease Neg Hx     Past Social History   Social History   Socioeconomic History   Marital status: Single    Spouse name: Not on file   Number of children: 1   Years of education: 12th   Highest education level: 12th grade  Occupational History    Employer: NOT EMPLOYED    Comment: disabled  Tobacco Use   Smoking status: Former    Current packs/day: 0.00    Types: Cigarettes    Quit date: 10/07/2010    Years since quitting: 12.8   Smokeless tobacco: Never   Tobacco comments:    Quit sometime after his second psychiatry appointment in early 2024  Vaping Use   Vaping status: Never Used  Substance and Sexual Activity   Alcohol use: Not Currently    Comment: 1 beer on special occasions   Drug use: Not Currently    Types: Marijuana    Comment: Stopped after initial  psychiatry appointment.  Rolled blunt at night to help him sleep will also use when getting irritable.  Started week of 06/23/22   Sexual activity: Yes    Birth control/protection: None  Other Topics Concern   Not on file  Social History Narrative   Lives with mother   Gearldine Shown and Celine Ahr in the home.   Caffeine Use: occasionally   Diet: eats all food groups   Water: 6-8  cups daily       Likes to game late and sleep in until noon   [facility]      Does not drive   Wears seat belt    Smoke detectors    No weapons   Right handed   Social Drivers of Health   Financial Resource Strain: Low Risk  (08/05/2023)   Overall Financial Resource Strain (CARDIA)    Difficulty of Paying Living Expenses: Not hard at all  Food Insecurity: Food Insecurity Present (08/05/2023)   Hunger Vital Sign    Worried About Running Out of Food in the Last Year: Sometimes true    Ran Out of Food in the Last Year: Sometimes true  Transportation Needs: No Transportation Needs (08/05/2023)   PRAPARE - Administrator, Civil Service (Medical): No    Lack of Transportation (Non-Medical): No  Physical Activity: Unknown (08/05/2023)   Exercise Vital Sign    Days of Exercise per Week: 0 days    Minutes of Exercise per Session: Not on file  Stress: Stress Concern Present (08/05/2023)   [employer] of Occupational Health - Occupational Stress Questionnaire    Feeling of Stress : Very much  Social Connections: Moderately Integrated (08/05/2023)   Social Connection and Isolation Panel [NHANES]    Frequency of Communication with Friends and Family: More than three times a week    Frequency of Social Gatherings with Friends and Family: More than three times a week    Attends Religious Services: 1 to 4 times per year    Active Member of [employer] or Organizations: Yes    Attends Banker Meetings: 1 to 4 times per year    Marital Status: Separated  Intimate Partner  Violence: Not At Risk (06/12/2020)   Humiliation, Afraid, Rape, and Kick questionnaire    Fear of Current or Ex-Partner: No    Emotionally Abused: No    Physically Abused: No    Sexually Abused: No    Review of Systems   General: Negative for anorexia, weight loss, fever, chills, fatigue, weakness. ENT: Negative for hoarseness, difficulty swallowing , nasal congestion. CV: Negative for chest pain, angina, palpitations, dyspnea on exertion, peripheral edema.  Respiratory: Negative for dyspnea at rest, dyspnea on exertion, cough, sputum, wheezing.  GI: See history of present illness. GU:  Negative for dysuria, hematuria, urinary incontinence, urinary frequency, nocturnal urination.  Endo: Negative for unusual weight change.     Physical Exam   There were no vitals taken for this visit.   General: Well-nourished, well-developed in no acute distress.  Eyes: No icterus. Mouth: Oropharyngeal mucosa moist and pink , no lesions erythema or exudate. Lungs: Clear to auscultation bilaterally.  Heart: Regular rate and rhythm, no murmurs rubs or gallops.  Abdomen: Bowel sounds are normal, nontender, nondistended, no hepatosplenomegaly or masses,  no abdominal bruits or hernia , no rebound or guarding.  Rectal: ***  Extremities: No lower extremity edema. No clubbing or deformities. Neuro: Alert and oriented x 4   Skin: Warm and dry, no jaundice.   Psych: Alert and cooperative, normal mood and affect.  Labs   Lab Results  Component Value Date   TSH 1.040 08/06/2023   Lab Results  Component Value Date   NA 142 08/06/2023   CL 107 (H) 08/06/2023   K 4.0 08/06/2023   CO2 20 08/06/2023   BUN 9 08/06/2023   CREATININE 0.94 08/06/2023   EGFR 111 08/06/2023  CALCIUM 10.0 08/06/2023   ALBUMIN 4.4 08/06/2023   GLUCOSE 88 08/06/2023   Lab Results  Component Value Date   ALT 14 08/06/2023   AST 18 08/06/2023   ALKPHOS 69 08/06/2023   BILITOT 0.4 08/06/2023   Lab Results  Component  Value Date   VITAMINB12 350 08/06/2023    Imaging Studies   No results found.  Assessment      Ulcerative pancolitis, loose stools: Ulcerative pancolitis diagnosed on colonoscopy in 2017.  Initially treated with Apriso.  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  He began following with [facility] GI surgery, Dr. Caren Griffins and he underwent total colectomy and completion proctectomy with creation of a J-pouch.  This was complicated by bowel perforation for which he had multiple drains placed and briefly had TPN for nutrition.  He eventually recovered from surgery and followed up outpatient. Diarrhea was being managed with Imodium and Bentyl as needed.  Currently without any significant GI issues.  Does have a history of a cholecystectomy in the past and has diarrhea requiring daily.  Denies any melena, BRBPR, abdominal pain, weight loss, lack of appetite.  To help with postprandial diarrhea which I suspect is bile acid.  Cholecystectomy we will cholestyramine 4 g once daily.  Advised on proper administration and to monitor for constipation.   PLAN   ***   Leanna Battles. Melvyn Neth, MHS, PA-C [facility]

## 2023-08-31 ENCOUNTER — Ambulatory Visit: Payer: MEDICAID | Admitting: Neurology

## 2023-09-01 ENCOUNTER — Ambulatory Visit: Payer: MEDICAID | Admitting: Gastroenterology

## 2023-09-14 NOTE — Progress Notes (Unsigned)
 NEUROLOGY FOLLOW UP OFFICE NOTE  PRICE LACHAPELLE [id number]  Assessment/Plan:     Memory deficits Migraine without aura, without status migrainosus, not intractable - stable History of seizures vs nonepileptic spells Bipolar 1 disorder/mental developmental delay   Regarding memory difficulty  Start B12 daily as levels less than 400 may contribute to cognitive deficits.  Recheck level a week prior to follow up  Check MRI of brain with and without contrast 2.  For migraine rescue:  Sumatriptan 100mg  with promethazine 25mg . 3.  Migraine preventative medication not indicated. 4.  Follow up in 6 months      Subjective:  Wayne Avila is a 33 year old male with ADHD, Bipolar 1 disorder, ulcerative colitis and remote history of seizures (not on medication) whom I have seen for migraines presents today for memory problems.  He is accompanied by his mother who supplements history.  UPDATE: Last seen on 01/07/2022.  From a migraine standpoint, he continues to do well. Intensity:  severe Duration:  several minutes with sumatriptan Frequency:  1 to 2 times a month Frequency of abortive medication: 1 to 2 times a month  He has been having memory issues.  His mother has noticed it for the past couple of months.  Forgets appointments or needs to stop to remember what he needs to do that day.  Labs from 08/06/2023 include TSH 1.040, free T4 0.90 and B12 350.    Current NSAIDS/analgesics:  Tylenol Current triptans:  sumatriptan 100mg  Current ergotamine:  none Current anti-emetic:  none Current muscle relaxants:  none Current Antihypertensive medications:  none Current Antidepressant medications:  none  Current Anticonvulsant medications:  none Current anti-CGRP:  none Current Vitamins/Herbal/Supplements:  D, Magnesium Current Antihistamines/Decongestants: Claritin Other therapy:  none Hormone/birth control:  none  Caffeine:  No coffee.  No soda Alcohol:  Occasional Smoker:  1  cigarette a day Diet:  Significantly increased water intake.  No soda.  Does not skip meals Exercise:  usually active but no exercise regimen Other pain:  no Sleep:  Goes to bed 10-11 PM.  Sleeps through the night.    HISTORY:  Onset:  2-3 years ago Location:  Predominantly frontal and temporal bilaterally Quality:  pounding Intensity:  severe Aura:  no Prodrome:  no Associated symptoms:  Nausea, vomiting, photophobia, phonophobia, elevated blood pressure.  He denies associated unilateral numbness or weakness. Duration:  1 to 2 days.  With Fioricet, 15-20 days Frequency:  1 to 2 times a month Frequency of abortive medication: 2 times a month Triggers:  unsure Relieving factors:  Nothing.   Activity:  Movement aggravates migraine   CT head on 08/27/2017 to evaluate headache personally reviewed and was normal.  He had a seizure in 2015.     Remote history of seizures.  First seizure occurred in February 2014, which occurred in the middle of an argument.  Semiology staring episode, loss of consciousness and convulsions.  MRI of brain with and without contrast on 04/03/2014 personally reviewed was normal.  EEGs were normal.  He was admitted to the EMU at [facility] in 2015 but did not have an event during stay.  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  He was previously on Depakote.  He hasn't had a recurrent spell in years.       Past NSAIDS/analgesics:  Fioricet, ibuprofen, tramadol, maybe an opioid in the ED Past abortive triptans:  none Past abortive ergotamine:  none Past muscle relaxants:  Flexeril  Past anti-emetic:  Zofran, Reglan, Phenergan Past antihypertensive medications:  propranolol, clonidine Past antidepressant medications:  sertraline Past anticonvulsant medications:  Depakote Past anti-CGRP:  none Past vitamins/Herbal/Supplements:  none Past antihistamines/decongestants:  Benadryl, Sudafed, Flonase Other past therapies:  none    Family  history of headache: Mom, aunt  PAST MEDICAL HISTORY: Past Medical History:  Diagnosis Date   ADHD (attention deficit hyperactivity disorder)    Anemia    Anxiety    Asthma    Autoimmune hepatitis (HCC)    Autoimmune hepatitis (HCC) 01/02/2017   Bipolar 1 disorder (HCC)    Bipolar 1 disorder, manic, moderate (HCC) 11/09/2014   C. difficile colitis 03/22/2016   C. difficile diarrhea 03/22/2016   Clostridium difficile colitis    Complication of anesthesia    Depression    Family history of adverse reaction to anesthesia    mother- N/v and headache   GERD (gastroesophageal reflux disease)    Headache    HOH (hard of hearing)    Hypertension    Mental developmental delay    Pneumonia    PONV (postoperative nausea and vomiting)    Pre-diabetes    Seizures (HCC)    Ulcerative colitis (HCC)     MEDICATIONS: Current Outpatient Medications on File Prior to Visit  Medication Sig Dispense Refill   albuterol (PROVENTIL) (2.5 MG/3ML) 0.083% nebulizer solution Inhale 3 mLs (2.5 mg total) into the lungs every 6 (six) hours as needed for wheezing or shortness of breath. 75 mL 3   albuterol (VENTOLIN HFA) 108 (90 Base) MCG/ACT inhaler Inhale 2 puffs into the lungs every 4 (four) hours as needed for wheezing or shortness of breath. 1 each 2   cholecalciferol (VITAMIN D) 1000 units tablet Take 1 tablet (1,000 Units total) by mouth daily. START AFTER VITAMIN D 50,000 U TABLETS ARE COMPLETE (Patient taking differently: Take 1,000 Units by mouth daily.) 30 tablet 11   EPINEPHrine 0.3 mg/0.3 mL IJ SOAJ injection Inject 0.3 mg into the muscle once as needed (for severe allergic reaction). 1 each 1   famotidine (PEPCID) 20 MG tablet Take 1 tablet (20 mg total) by mouth daily. 90 tablet 1   pantoprazole (PROTONIX) 20 MG tablet TAKE 1 TABLET(20 MG) BY MOUTH DAILY 90 tablet 0   Respiratory Therapy Supplies (NEBULIZER COMPRESSOR) KIT And supplies 1 each 0   SUMAtriptan (IMITREX) 100 MG tablet Take 1  tablet earliest onset of headache.  May repeat in 2 hours if headache persists or recurs.  Maximum 2 tablets in 24 hours. 10 tablet 5   No current facility-administered medications on file prior to visit.     ALLERGIES: Allergies  Allergen Reactions   Nsaids Other (See Comments)    Should not take due to GI condition   Other Other (See Comments)    Should not take 2/2 GI condition Other reaction(s): Other (See Comments) Should not take due to GI condition   Pineapple Swelling and Other (See Comments)    Reaction:  Lip swelling   Remicade [Infliximab] Other (See Comments)    AUTOIMMUNE HEPATITIS   Strawberry Extract Swelling    Reaction:  Lip swelling   Amoxicillin Nausea And Vomiting   Amoxicillin-Pot Clavulanate Nausea And Vomiting and Other (See Comments)    GI intolerance    Omeprazole Nausea And Vomiting   Tomato Rash    FAMILY HISTORY: Family History  Problem Relation Age of Onset   Asthma Mother    Ulcers Mother    Bipolar disorder Mother  ADD / ADHD Father    Ulcerative colitis Maternal Aunt    Cancer Maternal Aunt        Leukemia   Ulcerative colitis Paternal Uncle    Colon polyps Maternal Grandmother    Diabetes Maternal Grandmother    Hypertension Maternal Grandmother    Sleep apnea Maternal Grandmother    Heart disease Maternal Grandmother    Diabetes Maternal Grandfather    Hypertension Maternal Grandfather    Sleep apnea Maternal Grandfather    Heart disease Maternal Grandfather    Cancer Maternal Grandfather        prostate   Colon cancer Neg Hx    Liver disease Neg Hx       Objective:  Blood pressure 115/75, pulse 75, resp. rate 20, height 5' 8.5" (1.74 m), SpO2 98%. General: No acute distress.  Patient appears well-groomed.   Head:  Normocephalic/atraumatic Neck:  Supple.  No paraspinal tenderness.  Full range of motion. Heart:  Regular rate and rhythm. Neuro:  Alert and oriented.  Speech fluent and not dysarthric.  Language intact.  CN  II-XII intact.  Bulk and tone normal.  Muscle strength 5/5 throughout.  Deep tendon reflexes 2+ throughout  Gait normal.  Romberg negative.    Shon Millet, DO  CC: Trena Platt, MD

## 2023-09-15 ENCOUNTER — Ambulatory Visit (INDEPENDENT_AMBULATORY_CARE_PROVIDER_SITE_OTHER): Payer: MEDICAID | Admitting: Neurology

## 2023-09-15 ENCOUNTER — Encounter: Payer: Self-pay | Admitting: Neurology

## 2023-09-15 VITALS — BP 115/75 | HR 75 | Resp 20 | Ht 68.5 in

## 2023-09-15 DIAGNOSIS — R413 Other amnesia: Secondary | ICD-10-CM | POA: Diagnosis not present

## 2023-09-15 DIAGNOSIS — G43009 Migraine without aura, not intractable, without status migrainosus: Secondary | ICD-10-CM

## 2023-09-15 NOTE — Patient Instructions (Signed)
 Start taking over the counter B12 daily.  Repeat level about one week prior to follow up in 6 months. Check MRI of brain with and without contrast Further recommendations pending results. Follow up 6 months (a week after repeat B12 level)

## 2023-09-29 ENCOUNTER — Ambulatory Visit (INDEPENDENT_AMBULATORY_CARE_PROVIDER_SITE_OTHER): Payer: MEDICAID

## 2023-09-29 ENCOUNTER — Ambulatory Visit: Payer: Self-pay

## 2023-09-29 VITALS — BP 129/89 | HR 82 | Ht 68.0 in | Wt 232.0 lb

## 2023-09-29 DIAGNOSIS — J069 Acute upper respiratory infection, unspecified: Secondary | ICD-10-CM | POA: Diagnosis not present

## 2023-09-29 MED ORDER — AZITHROMYCIN 250 MG PO TABS: ORAL_TABLET | ORAL | 0 refills | Status: AC

## 2023-09-29 MED ORDER — PROMETHAZINE-DM 6.25-15 MG/5ML PO SYRP
5.0000 mL | ORAL_SOLUTION | Freq: Four times a day (QID) | ORAL | 0 refills | Status: DC | PRN
Start: 2023-09-29 — End: 2024-02-22

## 2023-09-29 NOTE — Telephone Encounter (Signed)
 Will see Rice Chamorro today.

## 2023-09-29 NOTE — Telephone Encounter (Signed)
 Copied from CRM [id number]. Topic: Clinical - Red Word Triage >> [name] 29, 2023  9:18 AM Rosamond Comes wrote: Red Word that prompted transfer to Nurse Triage: patient mother Marjo Sievert calling in. Patient has asthma, coughing for 2 weeks, coughing up green mucus   Chief Complaint: Productive cough Symptoms: productive cough with green phlegm Frequency: 2 weeks Pertinent Negatives: Patient denies difficulty breathing at this time, coughing up blood,  Disposition: [] ED /[] Urgent Care (no appt availability in office) / [x] Appointment(In office/virtual)/ []  [facility]/ [] Home Care/ [] Refused Recommended Disposition /[] [facility]/ []  Follow-up with PCP Additional Notes: Patient's mother called and advised that for the past two weeks patient has been having a productive cough with green phlegm.   Patient's mother states that the patient was coughing consistently last night and had to use his inhaler due to having asthma. Mother denies the patient having any difficulty breathing at this time or coughing up blood.  She states that he did have a fever a few days ago. Mother states that she has an appointment at this office with her PCP this afternoon and she was hoping to get him an appointment today. This RN called the [facility] and they were able to get the patient scheduled at 3pm today at the office. Patient's mother is also advised that if anything gets worse the Emergency Room is always there. She verbalized understanding.   Reason for Disposition  [1] Continuous (nonstop) coughing interferes with work or school AND [2] no improvement using cough treatment per Care Advice  Answer Assessment - Initial Assessment Questions 1. ONSET: "When did the cough begin?"      2 weeks ago 2. SEVERITY: "How bad is the cough today?"      Last night cough was consistent 3. SPUTUM: "Describe the color of your sputum" (none, dry cough; clear, white, yellow, green)     green 4. HEMOPTYSIS: "Are you  coughing up any blood?" If so ask: "How much?" (flecks, streaks, tablespoons, etc.)     Mother doesn't think so 5. DIFFICULTY BREATHING: "Are you having difficulty breathing?" If Yes, ask: "How bad is it?" (e.g., mild, moderate, severe)    - MILD: No SOB at rest, mild SOB with walking, speaks normally in sentences, can lie down, no retractions, pulse < 100.    - MODERATE: SOB at rest, SOB with minimal exertion and prefers to sit, cannot lie down flat, speaks in phrases, mild retractions, audible wheezing, pulse 100-120.    - SEVERE: Very SOB at rest, speaks in single words, struggling to breathe, sitting hunched forward, retractions, pulse > 120      Not today or last night per his mother 6. FEVER: "Do you have a fever?" If Yes, ask: "What is your temperature, how was it measured, and when did it start?"     A few days ago he did per mother 7. CARDIAC HISTORY: "Do you have any history of heart disease?" (e.g., heart attack, congestive heart failure)      Mother states something she couldn't remember the name  8. LUNG HISTORY: "Do you have any history of lung disease?"  (e.g., pulmonary embolus, asthma, emphysema)     Asthma 9. PE RISK FACTORS: "Do you have a history of blood clots?" (or: recent major surgery, recent prolonged travel, bedridden)     No 10. OTHER SYMPTOMS: "Do you have any other symptoms?" (e.g., runny nose, wheezing, chest pain)       Congestion, fever a few days ago  12. TRAVEL: "Have you traveled out of the country in the last month?" (e.g., travel history, exposures)       No  Protocols used: Cough - Acute Productive-A-AH

## 2023-09-29 NOTE — Progress Notes (Unsigned)
   Acute Office Visit  Subjective:     Patient ID: Wayne Avila, male    DOB: [birth year], 33 y.o.   MRN: [id number]  Chief Complaint  Patient presents with   Medical Management of Chronic Issues    Per mom "Fever 3 days ago none since he's here for a cough"    HPI Patient is in today for Fever: Patient presents with {fever:5004}. He has had the fever for {numbers; 0-10:33138} {time units:11}.  Symptoms have been {clinical course - history:17::"unchanged"}. Symptoms associated with the fever include {fever associated symptoms:12577}, and patient denies {fever associated symptoms:12577}.. Symptoms are worse at {time of day:12576}.  Symptoms have been {clinical course - history:17::"unchanged"} since that time. Patient has been {sleep:10079}. Appetite has been {assess:31394}. Urine output has been {assess:31394}. He has associated symptoms of   He has tried to alleviate the symptoms with {fever alleviating factors:12578} with {relief:12621} relief.   The patient has {fever comorbidities:12579}. Daycare:{yes/no:63}. Exposure to tobacco: {yes/no:63}. Exposure to someone else at home w/similar symptoms:{yes/no:63} Exposure to someone else at daycare/school/work:{yes/no:63}.   ROS      Objective:    BP 129/89   Pulse 82   Ht 5\' 8"  (1.727 m)   Wt 232 lb 0.6 oz (105.3 kg)   SpO2 95%   BMI 35.28 kg/m  {Vitals History (Optional):23777}  Physical Exam  No results found for any visits on 09/29/23.      Assessment & Plan:   Problem List Items Addressed This Visit   None   No orders of the defined types were placed in this encounter.   No follow-ups on file.  Alison Irvine, FNP

## 2023-10-19 ENCOUNTER — Encounter: Payer: Self-pay | Admitting: Neurology

## 2023-10-23 ENCOUNTER — Ambulatory Visit
Admission: RE | Admit: 2023-10-23 | Discharge: 2023-10-23 | Disposition: A | Discharge status: Home / Self Care | Payer: MEDICAID | Source: Ambulatory Visit | Attending: Neurology | Admitting: Neurology

## 2023-10-23 DIAGNOSIS — R413 Other amnesia: Secondary | ICD-10-CM

## 2023-10-23 MED ORDER — GADOPICLENOL 0.5 MMOL/ML IV SOLN
10.0000 mL | Freq: Once | INTRAVENOUS | Status: AC | PRN
  Administered 2023-10-23: 10 mL via INTRAVENOUS

## 2023-10-28 ENCOUNTER — Ambulatory Visit: Payer: Self-pay | Admitting: Neurology

## 2023-10-29 NOTE — Progress Notes (Signed)
 Results given to patient mother.

## 2023-11-01 ENCOUNTER — Emergency Department
Admit: 2023-11-01 | Discharge: 2023-11-01 | Disposition: A | Discharge status: Home / Self Care | Payer: Medicaid (Managed Care)

## 2024-02-03 ENCOUNTER — Ambulatory Visit (INDEPENDENT_AMBULATORY_CARE_PROVIDER_SITE_OTHER): Payer: MEDICAID | Admitting: Internal Medicine

## 2024-02-03 ENCOUNTER — Encounter: Payer: Self-pay | Admitting: Internal Medicine

## 2024-02-03 VITALS — BP 113/77 | HR 66 | Ht 68.0 in | Wt 231.4 lb

## 2024-02-03 DIAGNOSIS — G43809 Other migraine, not intractable, without status migrainosus: Secondary | ICD-10-CM | POA: Diagnosis not present

## 2024-02-03 DIAGNOSIS — K51019 Ulcerative (chronic) pancolitis with unspecified complications: Secondary | ICD-10-CM

## 2024-02-03 DIAGNOSIS — G40909 Epilepsy, unspecified, not intractable, without status epilepticus: Secondary | ICD-10-CM | POA: Diagnosis not present

## 2024-02-03 DIAGNOSIS — F3162 Bipolar disorder, current episode mixed, moderate: Secondary | ICD-10-CM | POA: Diagnosis not present

## 2024-02-03 DIAGNOSIS — J0110 Acute frontal sinusitis, unspecified: Secondary | ICD-10-CM | POA: Insufficient documentation

## 2024-02-03 MED ORDER — FLUTICASONE PROPIONATE 50 MCG/ACT NA SUSP: 2.0000 | Freq: Every day | NASAL | 6 refills | Status: AC

## 2024-02-03 MED ORDER — AZITHROMYCIN 250 MG PO TABS: ORAL_TABLET | ORAL | 0 refills | Status: AC

## 2024-02-03 NOTE — Assessment & Plan Note (Signed)
Patient denies recent seizure activity.  Currently not on medication, has been seizure-free for many years now. Followed by neurology 

## 2024-02-03 NOTE — Assessment & Plan Note (Addendum)
 Has sumatriptan  as needed for migraine Followed by neurology - last visit note reviewed

## 2024-02-03 NOTE — Patient Instructions (Signed)
 Please start taking Azithromycin  as prescribed. Please use Flonase  for nasal congestion/allergies. Okay to take Zyrtec as needed for allergies.  Please contact GI office for Ulcerative colitis.

## 2024-02-03 NOTE — Assessment & Plan Note (Signed)
 Had discussion with psychiatrist in the past Was placed on Lamictal , but did not continue as he lost follow-up Referred to [facility] behavioral health clinic, but did not follow up Referral placed to [facility] health psychiatric clinic in [place]

## 2024-02-03 NOTE — Assessment & Plan Note (Signed)
 Started empiric azithromycin  due to persistent symptoms (allergic to Augmentin) Flonase  for nasal congestion/allergies He likely has allergic rhinitis as well, advised to take Zyrtec as needed

## 2024-02-03 NOTE — Progress Notes (Signed)
 Acute Office Visit  Subjective:    Patient ID: Wayne Avila, male    DOB: November 10, 1990, 33 y.o.   MRN: [id number]  Chief Complaint  Patient presents with   Asthma    6 month f/u    Nasal Congestion    Pt reports sx of coughing and sneezing.     HPI Patient is in today for follow-up of chronic medical conditions.  He complains of nasal congestion, postnasal drip, sinus pressure related headache and cough with yellowish-green expectoration for the last 1 week.  He was show reports sneezing at times.  His kids had similar symptoms.  Denies fever or chills currently.  He denies any dyspnea or wheezing currently.  He was placed on Lamictal  by Dr. Barbra, but had lost follow-up with him.  He denied any major change in mood symptoms and anger issues with Lamictal .  He still has apathy, anger bursts and insomnia.  He was referred to [facility] psychiatric clinic in 08/24, but his mother reports that he did not see them as he does not want to be placed in inpatient facility.  I had lengthy discussion with him about importance to see a psychiatrist and reassured that he needs outpatient treatment, and not inpatient treatment.  He denies any SI or HI currently.  History of ulcerative colitis status post partial colectomy: He denies any rectal bleeding currently.  He feels that his stool color is related to soft drink intake.  He has history of UC, s/p partial colectomy.  He has not followed up with GI since 04/24.  He reports chronic fatigue.  Denies any recent fever or chills.  Past Medical History:  Diagnosis Date   ADHD (attention deficit hyperactivity disorder)    Anemia    Anxiety    Asthma    Autoimmune hepatitis (HCC)    Autoimmune hepatitis (HCC) 01/02/2017   Bipolar 1 disorder (HCC)    Bipolar 1 disorder, manic, moderate (HCC) 11/09/2014   C. difficile colitis 03/22/2016   C. difficile diarrhea 03/22/2016   Clostridium difficile colitis    Complication of anesthesia    Depression     Family history of adverse reaction to anesthesia    mother- N/v and headache   GERD (gastroesophageal reflux disease)    Headache    HOH (hard of hearing)    Hypertension    Mental developmental delay    Pneumonia    PONV (postoperative nausea and vomiting)    Pre-diabetes    Seizures (HCC)    Ulcerative colitis (HCC)     Past Surgical History:  Procedure Laterality Date   ADENOIDECTOMY     BIOPSY N/A 12/07/2012   Procedure: GASTRIC BIOPSIES;  Surgeon: Margo LITTIE Haddock, MD;  Location: [facility];  Service: Endoscopy;  Laterality: N/A;   BIOPSY N/A 08/30/2013   Procedure: BIOPSY;  Surgeon: Margo LITTIE Haddock, MD;  Location: [facility];  Service: Endoscopy;  Laterality: N/A;  right colon,transverse colon, left colon, rectal biopsies   BIOPSY  11/20/2015   Procedure: BIOPSY;  Surgeon: Margo LITTIE Haddock, MD;  Location: [facility];  Service: Endoscopy;;  ileal, right colon biopsy, left colon, rectum   BIOPSY  04/18/2016   Procedure: BIOPSY;  Surgeon: Lamar CHRISTELLA Hollingshead, MD;  Location: [facility];  Service: Endoscopy;;  left descending colon biopsies   CHOLECYSTECTOMY N/A 01/29/2021   Procedure: LAPAROSCOPIC CHOLECYSTECTOMY WITH INTRAOPERATIVE CHOLANGIOGRAM AND ICG GYE , POSSIBLE OPEN;  Surgeon: Tanda Locus, MD;  Location: [facility];  Service:  General;  Laterality: N/A;   COLON SURGERY     ileostomy   COLONOSCOPY  11/20/2015   Dr. harvey: Severe erythema, edema, ulcers from the anal verge to 20 cm above the anal verge without mucosal sparing, remaining colon and terminal ileum appeared normal. Biopsies from the rectum revealed fulminant active chronic colitis consistent with IBD. Pathology from terminal ileum revealed intramucosal lymphoid aggregates. Remaining colon random biopsies with inactive chronic colitis   COLONOSCOPY WITH PROPOFOL  N/A 08/30/2013   DOQ:Wnmfjo mucosa in the terminal ileum/COLITIS/ MILD PROCTITIS. Biopsies showed patchy chronic active colitis of the right colon and rectum, overall  findings most consistent with idiopathic inflammatory bowel disease.   COLONOSCOPY WITH PROPOFOL  N/A 11/20/2015   Dr. harvey: chronic inactive pancolitis and active severe ulcerative proctitis    COLOPROCTECTOMY W/ ILEO J POUCH     ESOPHAGOGASTRODUODENOSCOPY  11/20/2015   Dr. harvey: Normal exam, stomach biopsied and duodenal biopsy.. Benign biopsies.   ESOPHAGOGASTRODUODENOSCOPY (EGD) WITH PROPOFOL  N/A 12/07/2012   SLF:The mucosa of the esophagus appeared normal Non-erosive gastritis (inflammation) was found in the gastric antrum; multiple biopsies The duodenal mucosa showed no abnormalities in the bulb and second portion of the duodenum   ESOPHAGOGASTRODUODENOSCOPY (EGD) WITH PROPOFOL  N/A 11/20/2015   Dr. harvey: normal with normal biopsies, negative H.pylori    ESOPHAGOGASTRODUODENOSCOPY (EGD) WITH PROPOFOL  N/A 04/18/2016   Procedure: ESOPHAGOGASTRODUODENOSCOPY (EGD) WITH PROPOFOL ;  Surgeon: Lamar CHRISTELLA Hollingshead, MD;  [facility];  Service: Endoscopy;  Laterality: N/A;   FLEXIBLE SIGMOIDOSCOPY N/A 04/18/2016   Procedure: FLEXIBLE SIGMOIDOSCOPY;  Surgeon: Lamar CHRISTELLA Hollingshead, MD;  [facility];  Service: Endoscopy;  Laterality: N/A;   LYSIS OF ADHESION N/A 01/29/2021   Procedure: LYSIS OF ADHESION;  Surgeon: Tanda Locus, MD;  Location: [facility];  Service: General;  Laterality: N/A;   TONSILLECTOMY      Family History  Problem Relation Age of Onset   Asthma Mother    Ulcers Mother    Bipolar disorder Mother    ADD / ADHD Father    Ulcerative colitis Maternal Aunt    Cancer Maternal Aunt        Leukemia   Ulcerative colitis Paternal Uncle    Colon polyps Maternal Grandmother    Diabetes Maternal Grandmother    Hypertension Maternal Grandmother    Sleep apnea Maternal Grandmother    Heart disease Maternal Grandmother    Diabetes Maternal Grandfather    Hypertension Maternal Grandfather    Sleep apnea Maternal Grandfather    Heart disease Maternal Grandfather    Cancer  Maternal Grandfather        prostate   Colon cancer Neg Hx    Liver disease Neg Hx     Social History   Socioeconomic History   Marital status: Single    Spouse name: Not on file   Number of children: 1   Years of education: 12th   Highest education level: 12th grade  Occupational History    Employer: NOT EMPLOYED    Comment: disabled  Tobacco Use   Smoking status: Former    Current packs/day: 0.00    Types: Cigarettes    Quit date: 10/07/2010    Years since quitting: 13.3   Smokeless tobacco: Never   Tobacco comments:    Quit sometime after his second psychiatry appointment in early 2024  Vaping Use   Vaping status: Never Used  Substance and Sexual Activity   Alcohol use: Not Currently    Comment: 1 beer on  special occasions   Drug use: Not Currently    Types: Marijuana    Comment: Stopped after initial psychiatry appointment.  Rolled blunt at night to help him sleep will also use when getting irritable.  Started week of 06/23/22   Sexual activity: Yes    Birth control/protection: None  Other Topics Concern   Not on file  Social History Narrative   Lives with mother   Jeannine and Wylie in the home.   Caffeine Use: occasionally   Diet: eats all food groups   Water : 6-8 cups daily       Likes to game late and sleep in until noon   [facility]      Does not drive   Wears seat belt    Smoke detectors    No weapons   Right handed   Social Drivers of Health   Financial Resource Strain: Low Risk  (08/05/2023)   Overall Financial Resource Strain (CARDIA)    Difficulty of Paying Living Expenses: Not hard at all  Food Insecurity: Food Insecurity Present (08/05/2023)   Hunger Vital Sign    Worried About Running Out of Food in the Last Year: Sometimes true    Ran Out of Food in the Last Year: Sometimes true  Transportation Needs: No Transportation Needs (08/05/2023)   PRAPARE - Administrator, Civil Service (Medical): No    Lack  of Transportation (Non-Medical): No  Physical Activity: Unknown (08/05/2023)   Exercise Vital Sign    Days of Exercise per Week: 0 days    Minutes of Exercise per Session: Not on file  Stress: Stress Concern Present (08/05/2023)   [employer] of Occupational Health - Occupational Stress Questionnaire    Feeling of Stress : Very much  Social Connections: Moderately Integrated (08/05/2023)   Social Connection and Isolation Panel    Frequency of Communication with Friends and Family: More than three times a week    Frequency of Social Gatherings with Friends and Family: More than three times a week    Attends Religious Services: 1 to 4 times per year    Active Member of [employer] or Organizations: Yes    Attends Banker Meetings: 1 to 4 times per year    Marital Status: Separated  Intimate Partner Violence: Not At Risk (06/12/2020)   Humiliation, Afraid, Rape, and Kick questionnaire    Fear of Current or Ex-Partner: No    Emotionally Abused: No    Physically Abused: No    Sexually Abused: No    Outpatient Medications Prior to Visit  Medication Sig Dispense Refill   albuterol  (PROVENTIL ) (2.5 MG/3ML) 0.083% nebulizer solution Inhale 3 mLs (2.5 mg total) into the lungs every 6 (six) hours as needed for wheezing or shortness of breath. 75 mL 3   albuterol  (VENTOLIN  HFA) 108 (90 Base) MCG/ACT inhaler Inhale 2 puffs into the lungs every 4 (four) hours as needed for wheezing or shortness of breath. 1 each 2   cholecalciferol  (VITAMIN D ) 1000 units tablet Take 1 tablet (1,000 Units total) by mouth daily. START AFTER VITAMIN D  50,000 U TABLETS ARE COMPLETE (Patient taking differently: Take 1,000 Units by mouth daily.) 30 tablet 11   EPINEPHrine  0.3 mg/0.3 mL IJ SOAJ injection Inject 0.3 mg into the muscle once as needed (for severe allergic reaction). 1 each 1   famotidine  (PEPCID ) 20 MG tablet Take 1 tablet (20 mg total) by mouth daily. 90 tablet 1   pantoprazole  (  PROTONIX ) 20 MG  tablet TAKE 1 TABLET(20 MG) BY MOUTH DAILY 90 tablet 0   promethazine -dextromethorphan (PROMETHAZINE -DM) 6.25-15 MG/5ML syrup Take 5 mLs by mouth 4 (four) times daily as needed for cough. 118 mL 0   Respiratory Therapy Supplies (NEBULIZER COMPRESSOR) KIT And supplies 1 each 0   SUMAtriptan  (IMITREX ) 100 MG tablet Take 1 tablet earliest onset of headache.  May repeat in 2 hours if headache persists or recurs.  Maximum 2 tablets in 24 hours. 10 tablet 5   No facility-administered medications prior to visit.    Allergies  Allergen Reactions   Nsaids Other (See Comments)    Should not take due to GI condition   Other Other (See Comments)    Should not take 2/2 GI condition Other reaction(s): Other (See Comments) Should not take due to GI condition   Pineapple Swelling and Other (See Comments)    Reaction:  Lip swelling   Remicade  [Infliximab ] Other (See Comments)    AUTOIMMUNE HEPATITIS   Strawberry Extract Swelling    Reaction:  Lip swelling   Amoxicillin Nausea And Vomiting   Amoxicillin-Pot Clavulanate Nausea And Vomiting and Other (See Comments)    GI intolerance    Omeprazole Nausea And Vomiting   Tomato Rash    Review of Systems  Constitutional:  Negative for chills and fever.  HENT:  Positive for congestion, sinus pressure, sinus pain and sore throat.   Eyes:  Negative for pain and discharge.  Respiratory:  Positive for cough. Negative for shortness of breath.   Cardiovascular:  Negative for chest pain and palpitations.  Gastrointestinal:  Positive for diarrhea. Negative for constipation, nausea and vomiting.  Endocrine: Negative for polydipsia and polyuria.  Genitourinary:  Negative for dysuria and hematuria.  Musculoskeletal:  Negative for neck pain and neck stiffness.  Skin:  Negative for rash.  Neurological:  Negative for dizziness, weakness and numbness.  Psychiatric/Behavioral:  Positive for agitation, decreased concentration and sleep disturbance. The patient is  nervous/anxious.        Objective:    Physical Exam Vitals reviewed.  Constitutional:      General: He is not in acute distress.    Appearance: He is not diaphoretic.  HENT:     Head: Normocephalic and atraumatic.     Right Ear: There is no impacted cerumen.     Left Ear: There is no impacted cerumen.     Nose: Congestion present.     Right Sinus: Frontal sinus tenderness present.     Left Sinus: Frontal sinus tenderness present.     Mouth/Throat:     Mouth: Mucous membranes are moist.  Eyes:     General: No scleral icterus.    Extraocular Movements: Extraocular movements intact.  Cardiovascular:     Rate and Rhythm: Normal rate and regular rhythm.     Heart sounds: Normal heart sounds. No murmur heard. Pulmonary:     Breath sounds: Normal breath sounds. No wheezing or rales.  Musculoskeletal:     Cervical back: Neck supple. No tenderness.     Right lower leg: No edema.     Left lower leg: No edema.  Skin:    General: Skin is warm.     Findings: No rash.  Neurological:     General: No focal deficit present.     Mental Status: He is alert and oriented to person, place, and time.     Sensory: No sensory deficit.     Motor: No weakness.  Psychiatric:  Mood and Affect: Mood is anxious.        Behavior: Behavior is agitated.        Thought Content: Thought content does not include homicidal or suicidal ideation.     BP 113/77   Pulse 66   Ht 5' 8 (1.727 m)   Wt 231 lb 6.4 oz (105 kg)   SpO2 98%   BMI 35.18 kg/m  Wt Readings from Last 3 Encounters:  02/03/24 231 lb 6.4 oz (105 kg)  09/29/23 232 lb 0.6 oz (105.3 kg)  08/17/23 231 lb 9.6 oz (105.1 kg)        Assessment & Plan:   Problem List Items Addressed This Visit       Cardiovascular and Mediastinum   Migraine   Has sumatriptan  as needed for migraine Followed by neurology - last visit note reviewed        Respiratory   Acute non-recurrent frontal sinusitis - Primary   Started empiric  azithromycin  due to persistent symptoms (allergic to Augmentin) Flonase  for nasal congestion/allergies He likely has allergic rhinitis as well, advised to take Zyrtec as needed      Relevant Medications   azithromycin  (ZITHROMAX ) 250 MG tablet   fluticasone  (FLONASE ) 50 MCG/ACT nasal spray     Digestive   Ulcerative colitis with complication (HCC)   S/p colectomy Used to be followed by GI, needs follow-up visit Currently does not have melena or hematochezia Diarrhea improved with Questran , but noncompliant        Nervous and Auditory   Seizure disorder [facility])   Patient denies recent seizure activity.  Currently not on medication, has been seizure-free for many years now. Followed by neurology        Other   Bipolar disorder [facility])   Had discussion with psychiatrist in the past Was placed on Lamictal , but did not continue as he lost follow-up Referred to [facility] behavioral health clinic, but did not follow up Referral placed to [facility] health psychiatric clinic in [place]      Relevant Orders   Ambulatory referral to Psychiatry       Meds ordered this encounter  Medications   azithromycin  (ZITHROMAX ) 250 MG tablet    Sig: Take 2 tablets on day 1, then 1 tablet daily on days 2 through 5    Dispense:  6 tablet    Refill:  0   fluticasone  (FLONASE ) 50 MCG/ACT nasal spray    Sig: Place 2 sprays into both nostrils daily.    Dispense:  16 g    Refill:  6     Galan Ghee MARLA Blanch, MD

## 2024-02-03 NOTE — Assessment & Plan Note (Signed)
 S/p colectomy Used to be followed by GI, needs follow-up visit Currently does not have melena or hematochezia Diarrhea improved with Questran , but noncompliant

## 2024-02-16 ENCOUNTER — Ambulatory Visit: Payer: MEDICAID | Admitting: Neurology

## 2024-02-17 ENCOUNTER — Encounter: Payer: Self-pay | Admitting: Internal Medicine

## 2024-02-22 ENCOUNTER — Ambulatory Visit (INDEPENDENT_AMBULATORY_CARE_PROVIDER_SITE_OTHER): Payer: MEDICAID | Admitting: Internal Medicine

## 2024-02-22 ENCOUNTER — Ambulatory Visit (HOSPITAL_COMMUNITY)
Admission: RE | Admit: 2024-02-22 | Discharge: 2024-02-22 | Disposition: A | Discharge status: Home / Self Care | Payer: MEDICAID | Source: Ambulatory Visit | Attending: Internal Medicine | Admitting: Internal Medicine

## 2024-02-22 VITALS — BP 115/77 | HR 102 | Wt 228.6 lb

## 2024-02-22 DIAGNOSIS — J0110 Acute frontal sinusitis, unspecified: Secondary | ICD-10-CM | POA: Diagnosis not present

## 2024-02-22 DIAGNOSIS — R052 Subacute cough: Secondary | ICD-10-CM | POA: Insufficient documentation

## 2024-02-22 MED ORDER — MOXIFLOXACIN HCL 400 MG PO TABS: 400.0000 mg | ORAL_TABLET | Freq: Every day | ORAL | 0 refills | Status: AC

## 2024-02-22 MED ORDER — PROMETHAZINE-DM 6.25-15 MG/5ML PO SYRP: 5.0000 mL | ORAL_SOLUTION | Freq: Four times a day (QID) | ORAL | 0 refills | Status: AC | PRN

## 2024-02-22 NOTE — Assessment & Plan Note (Signed)
 Likely related to postnasal drip Since he reports yellowish-green expectoration, ordered x-ray of chest

## 2024-02-22 NOTE — Patient Instructions (Signed)
 Please start taking Moxifloxacin  as prescribed.  Please use sinus inhaler or vaporizer as needed for nasal congestion.  Please get X-ray of chest done at [facility]

## 2024-02-22 NOTE — Assessment & Plan Note (Signed)
 Has completed azithromycin  recently Started empiric moxifloxacin  due to persistent symptoms (allergic to Augmentin) Flonase  for nasal congestion/allergies He likely has allergic rhinitis as well, advised to take Zyrtec as needed Promethazine  DM syrup as needed for cough

## 2024-02-22 NOTE — Progress Notes (Signed)
 Acute Office Visit  Subjective:    Patient ID: Wayne Avila, male    DOB: 1991/01/10, 33 y.o.   MRN: [id number]  Chief Complaint  Patient presents with   Cough    Reports sx of cough, runny nose, and green mucus ongoing for about 2 weeks.     HPI Patient is in today for c/o persistent nasal congestion, postnasal drip, sinus pressure related headache and cough with yellowish-green expectoration for the last 2 weeks. His kids had similar symptoms.  Denies fever or chills currently.  He denies any dyspnea or wheezing currently.  He was given azithromycin  2 weeks ago, patient did not improve his symptoms.  Past Medical History:  Diagnosis Date   ADHD (attention deficit hyperactivity disorder)    Anemia    Anxiety    Asthma    Autoimmune hepatitis (HCC)    Autoimmune hepatitis (HCC) 01/02/2017   Bipolar 1 disorder (HCC)    Bipolar 1 disorder, manic, moderate (HCC) 11/09/2014   C. difficile colitis 03/22/2016   C. difficile diarrhea 03/22/2016   Clostridium difficile colitis    Complication of anesthesia    Depression    Family history of adverse reaction to anesthesia    mother- N/v and headache   GERD (gastroesophageal reflux disease)    Headache    HOH (hard of hearing)    Hypertension    Mental developmental delay    Pneumonia    PONV (postoperative nausea and vomiting)    Pre-diabetes    Seizures (HCC)    Ulcerative colitis (HCC)     Past Surgical History:  Procedure Laterality Date   ADENOIDECTOMY     BIOPSY N/A 12/07/2012   Procedure: GASTRIC BIOPSIES;  Surgeon: Margo LITTIE Haddock, MD;  Location: [facility];  Service: Endoscopy;  Laterality: N/A;   BIOPSY N/A 08/30/2013   Procedure: BIOPSY;  Surgeon: Margo LITTIE Haddock, MD;  Location: [facility];  Service: Endoscopy;  Laterality: N/A;  right colon,transverse colon, left colon, rectal biopsies   BIOPSY  11/20/2015   Procedure: BIOPSY;  Surgeon: Margo LITTIE Haddock, MD;  Location: [facility];  Service: Endoscopy;;  ileal, right  colon biopsy, left colon, rectum   BIOPSY  04/18/2016   Procedure: BIOPSY;  Surgeon: Lamar CHRISTELLA Hollingshead, MD;  Location: [facility];  Service: Endoscopy;;  left descending colon biopsies   CHOLECYSTECTOMY N/A 01/29/2021   Procedure: LAPAROSCOPIC CHOLECYSTECTOMY WITH INTRAOPERATIVE CHOLANGIOGRAM AND ICG GYE , POSSIBLE OPEN;  Surgeon: Tanda Locus, MD;  Location: [facility];  Service: General;  Laterality: N/A;   COLON SURGERY     ileostomy   COLONOSCOPY  11/20/2015   Dr. haddock: Severe erythema, edema, ulcers from the anal verge to 20 cm above the anal verge without mucosal sparing, remaining colon and terminal ileum appeared normal. Biopsies from the rectum revealed fulminant active chronic colitis consistent with IBD. Pathology from terminal ileum revealed intramucosal lymphoid aggregates. Remaining colon random biopsies with inactive chronic colitis   COLONOSCOPY WITH PROPOFOL  N/A 08/30/2013   DOQ:Wnmfjo mucosa in the terminal ileum/COLITIS/ MILD PROCTITIS. 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   COLONOSCOPY WITH PROPOFOL  N/A 11/20/2015   Dr. Haddock: chronic inactive pancolitis and active severe ulcerative proctitis    COLOPROCTECTOMY W/ ILEO J POUCH     ESOPHAGOGASTRODUODENOSCOPY  11/20/2015   Dr. Haddock: Normal exam, stomach biopsied and duodenal biopsy.. Benign biopsies.   ESOPHAGOGASTRODUODENOSCOPY (EGD) WITH PROPOFOL   N/A 12/07/2012   SLF:The mucosa of the esophagus appeared normal Non-erosive gastritis (inflammation) was found in the gastric antrum; multiple biopsies The duodenal mucosa showed no abnormalities in the bulb and second portion of the duodenum   ESOPHAGOGASTRODUODENOSCOPY (EGD) WITH PROPOFOL  N/A 11/20/2015   Dr. Harvey: normal with normal biopsies, negative H.pylori    ESOPHAGOGASTRODUODENOSCOPY (EGD) WITH PROPOFOL  N/A 04/18/2016   Procedure: ESOPHAGOGASTRODUODENOSCOPY  (EGD) WITH PROPOFOL ;  Surgeon: Lamar CHRISTELLA Hollingshead, MD;  Location: [facility];  Service: Endoscopy;  Laterality: N/A;   FLEXIBLE SIGMOIDOSCOPY N/A 04/18/2016   Procedure: FLEXIBLE SIGMOIDOSCOPY;  Surgeon: Lamar CHRISTELLA Hollingshead, MD;  Location: [facility];  Service: Endoscopy;  Laterality: N/A;   LYSIS OF ADHESION N/A 01/29/2021   Procedure: LYSIS OF ADHESION;  Surgeon: Tanda Locus, MD;  Location: [facility];  Service: General;  Laterality: N/A;   TONSILLECTOMY      Family History  Problem Relation Age of Onset   Asthma Mother    Ulcers Mother    Bipolar disorder Mother    ADD / ADHD Father    Ulcerative colitis Maternal Aunt    Cancer Maternal Aunt        Leukemia   Ulcerative colitis Paternal Uncle    Colon polyps Maternal Grandmother    Diabetes Maternal Grandmother    Hypertension Maternal Grandmother    Sleep apnea Maternal Grandmother    Heart disease Maternal Grandmother    Diabetes Maternal Grandfather    Hypertension Maternal Grandfather    Sleep apnea Maternal Grandfather    Heart disease Maternal Grandfather    Cancer Maternal Grandfather        prostate   Colon cancer Neg Hx    Liver disease Neg Hx     Social History   Socioeconomic History   Marital status: Single    Spouse name: Not on file   Number of children: 1   Years of education: 12th   Highest education level: 12th grade  Occupational History    Employer: NOT EMPLOYED    Comment: disabled  Tobacco Use   Smoking status: Former    Current packs/day: 0.00    Types: Cigarettes    Quit date: 10/07/2010    Years since quitting: 13.3   Smokeless tobacco: Never   Tobacco comments:    Quit sometime after his second psychiatry appointment in early 2024  Vaping Use   Vaping status: Never Used  Substance and Sexual Activity   Alcohol use: Not Currently    Comment: 1 beer on special occasions   Drug use: Not Currently    Types: Marijuana    Comment: Stopped after initial psychiatry appointment.  Rolled blunt at  night to help him sleep will also use when getting irritable.  Started week of 06/23/22   Sexual activity: Yes    Birth control/protection: None  Other Topics Concern   Not on file  Social History Narrative   Lives with mother   Jeannine and Wylie in the home.   Caffeine Use: occasionally   Diet: eats all food groups   Water : 6-8 cups daily       Likes to game late and sleep in until noon   [facility]      Does not drive   Wears seat belt    Smoke detectors    No weapons   Right handed   Social Drivers of Health   Financial Resource Strain: Low Risk  (02/22/2024)   Overall Financial  Resource Strain (CARDIA)    Difficulty of Paying Living Expenses: Not very hard  Food Insecurity: Food Insecurity Present (02/22/2024)   Hunger Vital Sign    Worried About Running Out of Food in the Last Year: Often true    Ran Out of Food in the Last Year: Often true  Transportation Needs: No Transportation Needs (02/22/2024)   PRAPARE - Administrator, Civil Service (Medical): No    Lack of Transportation (Non-Medical): No  Physical Activity: Inactive (02/22/2024)   Exercise Vital Sign    Days of Exercise per Week: 0 days    Minutes of Exercise per Session: Not on file  Stress: Stress Concern Present (02/22/2024)   [employer] of Occupational Health - Occupational Stress Questionnaire    Feeling of Stress: Rather much  Social Connections: Moderately Isolated (02/22/2024)   Social Connection and Isolation Panel    Frequency of Communication with Friends and Family: More than three times a week    Frequency of Social Gatherings with Friends and Family: Three times a week    Attends Religious Services: More than 4 times per year    Active Member of Clubs or Organizations: No    Attends Banker Meetings: Not on file    Marital Status: Separated  Intimate Partner Violence: Not At Risk (06/12/2020)   Humiliation, Afraid, Rape, and Kick  questionnaire    Fear of Current or Ex-Partner: No    Emotionally Abused: No    Physically Abused: No    Sexually Abused: No    Outpatient Medications Prior to Visit  Medication Sig Dispense Refill   albuterol  (PROVENTIL ) (2.5 MG/3ML) 0.083% nebulizer solution Inhale 3 mLs (2.5 mg total) into the lungs every 6 (six) hours as needed for wheezing or shortness of breath. 75 mL 3   albuterol  (VENTOLIN  HFA) 108 (90 Base) MCG/ACT inhaler Inhale 2 puffs into the lungs every 4 (four) hours as needed for wheezing or shortness of breath. 1 each 2   cholecalciferol  (VITAMIN D ) 1000 units tablet Take 1 tablet (1,000 Units total) by mouth daily. START AFTER VITAMIN D  50,000 U TABLETS ARE COMPLETE (Patient taking differently: Take 1,000 Units by mouth daily.) 30 tablet 11   EPINEPHrine  0.3 mg/0.3 mL IJ SOAJ injection Inject 0.3 mg into the muscle once as needed (for severe allergic reaction). 1 each 1   famotidine  (PEPCID ) 20 MG tablet Take 1 tablet (20 mg total) by mouth daily. 90 tablet 1   fluticasone  (FLONASE ) 50 MCG/ACT nasal spray Place 2 sprays into both nostrils daily. 16 g 6   pantoprazole  (PROTONIX ) 20 MG tablet TAKE 1 TABLET(20 MG) BY MOUTH DAILY 90 tablet 0   Respiratory Therapy Supplies (NEBULIZER COMPRESSOR) KIT And supplies 1 each 0   SUMAtriptan  (IMITREX ) 100 MG tablet Take 1 tablet earliest onset of headache.  May repeat in 2 hours if headache persists or recurs.  Maximum 2 tablets in 24 hours. 10 tablet 5   promethazine -dextromethorphan (PROMETHAZINE -DM) 6.25-15 MG/5ML syrup Take 5 mLs by mouth 4 (four) times daily as needed for cough. 118 mL 0   No facility-administered medications prior to visit.    Allergies  Allergen Reactions   Nsaids Other (See Comments)    Should not take due to GI condition   Other Other (See Comments)    Should not take 2/2 GI condition Other reaction(s): Other (See Comments) Should not take due to GI condition   Pineapple Swelling and Other (See Comments)  Reaction:  Lip swelling   Remicade  [Infliximab ] Other (See Comments)    AUTOIMMUNE HEPATITIS   Strawberry Extract Swelling    Reaction:  Lip swelling   Amoxicillin Nausea And Vomiting   Amoxicillin-Pot Clavulanate Nausea And Vomiting and Other (See Comments)    GI intolerance    Omeprazole Nausea And Vomiting   Tomato Rash    Review of Systems  Constitutional:  Negative for chills and fever.  HENT:  Positive for congestion, sinus pressure, sinus pain and sore throat.   Eyes:  Negative for pain and discharge.  Respiratory:  Positive for cough. Negative for shortness of breath.   Cardiovascular:  Negative for chest pain and palpitations.  Gastrointestinal:  Positive for diarrhea. Negative for constipation, nausea and vomiting.  Endocrine: Negative for polydipsia and polyuria.  Genitourinary:  Negative for dysuria and hematuria.  Musculoskeletal:  Negative for neck pain and neck stiffness.  Skin:  Negative for rash.  Neurological:  Negative for dizziness, weakness and numbness.  Psychiatric/Behavioral:  Positive for agitation, decreased concentration and sleep disturbance. The patient is nervous/anxious.        Objective:    Physical Exam Vitals reviewed.  Constitutional:      General: He is not in acute distress.    Appearance: He is not diaphoretic.  HENT:     Head: Normocephalic and atraumatic.     Right Ear: There is no impacted cerumen.     Left Ear: There is no impacted cerumen.     Nose: Congestion present.     Right Sinus: Frontal sinus tenderness present.     Left Sinus: Frontal sinus tenderness present.     Mouth/Throat:     Mouth: Mucous membranes are moist.  Eyes:     General: No scleral icterus.    Extraocular Movements: Extraocular movements intact.  Cardiovascular:     Rate and Rhythm: Normal rate and regular rhythm.     Heart sounds: Normal heart sounds. No murmur heard. Pulmonary:     Breath sounds: Normal breath sounds. No wheezing or rales.   Musculoskeletal:     Cervical back: Neck supple. No tenderness.     Right lower leg: No edema.     Left lower leg: No edema.  Skin:    General: Skin is warm.     Findings: No rash.  Neurological:     General: No focal deficit present.     Mental Status: He is alert and oriented to person, place, and time.     Sensory: No sensory deficit.     Motor: No weakness.  Psychiatric:        Mood and Affect: Mood is anxious.        Behavior: Behavior is agitated.        Thought Content: Thought content does not include homicidal or suicidal ideation.     BP 115/77   Pulse (!) 102   Wt 228 lb 9.6 oz (103.7 kg)   SpO2 98%   BMI 34.76 kg/m  Wt Readings from Last 3 Encounters:  02/22/24 228 lb 9.6 oz (103.7 kg)  02/03/24 231 lb 6.4 oz (105 kg)  09/29/23 232 lb 0.6 oz (105.3 kg)        Assessment & Plan:   Problem List Items Addressed This Visit       Respiratory   Acute non-recurrent frontal sinusitis - Primary   Has completed azithromycin  recently Started empiric moxifloxacin  due to persistent symptoms (allergic to Augmentin) Flonase  for nasal congestion/allergies He  likely has allergic rhinitis as well, advised to take Zyrtec as needed Promethazine  DM syrup as needed for cough      Relevant Medications   moxifloxacin  (AVELOX ) 400 MG tablet   promethazine -dextromethorphan (PROMETHAZINE -DM) 6.25-15 MG/5ML syrup     Other   Subacute cough   Likely related to postnasal drip Since he reports yellowish-green expectoration, ordered x-ray of chest      Relevant Orders   DG Chest 2 View     Meds ordered this encounter  Medications   moxifloxacin  (AVELOX ) 400 MG tablet    Sig: Take 1 tablet (400 mg total) by mouth daily for 7 days.    Dispense:  7 tablet    Refill:  0   promethazine -dextromethorphan (PROMETHAZINE -DM) 6.25-15 MG/5ML syrup    Sig: Take 5 mLs by mouth 4 (four) times daily as needed for cough.    Dispense:  118 mL    Refill:  0     Demarkis Gheen MARLA Blanch,  MD

## 2024-02-23 ENCOUNTER — Ambulatory Visit: Payer: Self-pay | Admitting: Internal Medicine

## 2024-02-28 ENCOUNTER — Emergency Department: Admit: 2024-02-28 | Discharge: 2024-02-28 | Disposition: A | Discharge status: Home / Self Care | Payer: MEDICAID

## 2024-02-28 DIAGNOSIS — H1031 Unspecified acute conjunctivitis, right eye: Principal | ICD-10-CM

## 2024-02-28 DIAGNOSIS — S0591XA Unspecified injury of right eye and orbit, initial encounter: Principal | ICD-10-CM

## 2024-02-28 MED ORDER — CEPHALEXIN 500 MG CAPSULE
ORAL_CAPSULE | Freq: Four times a day (QID) | ORAL | 0 refills | 5.00000 days | Status: CP
Start: 2024-02-28 — End: 2024-03-04

## 2024-03-01 ENCOUNTER — Encounter: Payer: Self-pay | Admitting: Internal Medicine

## 2024-03-01 ENCOUNTER — Ambulatory Visit: Payer: MEDICAID | Admitting: Internal Medicine

## 2024-03-01 VITALS — BP 108/72 | HR 76 | Ht 68.0 in | Wt 226.4 lb

## 2024-03-01 DIAGNOSIS — Z09 Encounter for follow-up examination after completed treatment for conditions other than malignant neoplasm: Secondary | ICD-10-CM

## 2024-03-01 DIAGNOSIS — S0591XD Unspecified injury of right eye and orbit, subsequent encounter: Secondary | ICD-10-CM

## 2024-03-01 DIAGNOSIS — S0591XA Unspecified injury of right eye and orbit, initial encounter: Secondary | ICD-10-CM | POA: Insufficient documentation

## 2024-03-01 NOTE — Patient Instructions (Signed)
 You are being referred to [facility] eye care.  [address] [place], [place] [address] Phone: [phone]  Please continue using moxifloxacin  eyedrops as prescribed for now.

## 2024-03-01 NOTE — Assessment & Plan Note (Signed)
 Recent ER visit for right eye injury, chart reviewed Advised to continue oral Keflex  and moxifloxacin  eyedrop for now

## 2024-03-01 NOTE — Progress Notes (Signed)
 Acute Office Visit  Subjective:    Patient ID: Wayne Avila, male    DOB: Nov 15, 1990, 33 y.o.   MRN: [id number]  Chief Complaint  Patient presents with   Eye Problem    Pt has right eye swelling and irritation, ongoing since last week, did go to UC.     HPI Patient is in today for evaluation of right eye pain for the last 5 days. He was hit on the top of the upper eyelid by a pressure washer tip. He thinks that something flew off the pressure washer tip and hit his eye.  He has noticed redness and upper eyelid swelling since then.  He also reports photophobia and has to wear dark glasses to avoid the light.  He went to [facility] on 02/28/24, and was given moxifloxacin  eyedrop and oral Keflex .  He denies any fever or chills.  Past Medical History:  Diagnosis Date   ADHD (attention deficit hyperactivity disorder)    Anemia    Anxiety    Asthma    Autoimmune hepatitis (HCC)    Autoimmune hepatitis (HCC) 01/02/2017   Bipolar 1 disorder (HCC)    Bipolar 1 disorder, manic, moderate (HCC) 11/09/2014   C. difficile colitis 03/22/2016   C. difficile diarrhea 03/22/2016   Clostridium difficile colitis    Complication of anesthesia    Depression    Family history of adverse reaction to anesthesia    mother- N/v and headache   GERD (gastroesophageal reflux disease)    Headache    HOH (hard of hearing)    Hypertension    Mental developmental delay    Pneumonia    PONV (postoperative nausea and vomiting)    Pre-diabetes    Seizures (HCC)    Ulcerative colitis (HCC)     Past Surgical History:  Procedure Laterality Date   ADENOIDECTOMY     BIOPSY N/A 12/07/2012   Procedure: GASTRIC BIOPSIES;  Surgeon: Margo LITTIE Haddock, MD;  Location: [facility];  Service: Endoscopy;  Laterality: N/A;   BIOPSY N/A 08/30/2013   Procedure: BIOPSY;  Surgeon: Margo LITTIE Haddock, MD;  Location: [facility];  Service: Endoscopy;  Laterality: N/A;  right colon,transverse colon, left colon, rectal biopsies   BIOPSY   11/20/2015   Procedure: BIOPSY;  Surgeon: Margo LITTIE Haddock, MD;  Location: [facility];  Service: Endoscopy;;  ileal, right colon biopsy, left colon, rectum   BIOPSY  04/18/2016   Procedure: BIOPSY;  Surgeon: Lamar CHRISTELLA Hollingshead, MD;  Location: [facility];  Service: Endoscopy;;  left descending colon biopsies   CHOLECYSTECTOMY N/A 01/29/2021   Procedure: LAPAROSCOPIC CHOLECYSTECTOMY WITH INTRAOPERATIVE CHOLANGIOGRAM AND ICG GYE , POSSIBLE OPEN;  Surgeon: Tanda Locus, MD;  Location: [facility];  Service: General;  Laterality: N/A;   COLON SURGERY     ileostomy   COLONOSCOPY  11/20/2015   Dr. haddock: Severe erythema, edema, ulcers from the anal verge to 20 cm above the anal verge without mucosal sparing, remaining colon and terminal ileum appeared normal. Biopsies from the rectum revealed fulminant active chronic colitis consistent with IBD. Pathology from terminal ileum revealed intramucosal lymphoid aggregates. Remaining colon random biopsies with inactive chronic colitis   COLONOSCOPY WITH PROPOFOL  N/A 08/30/2013   DOQ:Wnmfjo mucosa in the terminal ileum/COLITIS/ MILD PROCTITIS. 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   COLONOSCOPY WITH PROPOFOL  N/A 11/20/2015   Dr. Haddock: chronic inactive pancolitis and active  severe ulcerative proctitis    COLOPROCTECTOMY W/ ILEO J POUCH     ESOPHAGOGASTRODUODENOSCOPY  11/20/2015   Dr. Harvey: Normal exam, stomach biopsied and duodenal biopsy.. Benign biopsies.   ESOPHAGOGASTRODUODENOSCOPY (EGD) WITH PROPOFOL  N/A 12/07/2012   SLF:The mucosa of the esophagus appeared normal Non-erosive gastritis (inflammation) was found in the gastric antrum; multiple biopsies The duodenal mucosa showed no abnormalities in the bulb and second portion of the duodenum   ESOPHAGOGASTRODUODENOSCOPY (EGD) WITH PROPOFOL  N/A 11/20/2015   Dr. Harvey: normal with normal biopsies,  negative H.pylori    ESOPHAGOGASTRODUODENOSCOPY (EGD) WITH PROPOFOL  N/A 04/18/2016   Procedure: ESOPHAGOGASTRODUODENOSCOPY (EGD) WITH PROPOFOL ;  Surgeon: Lamar CHRISTELLA Hollingshead, MD;  Location: [facility];  Service: Endoscopy;  Laterality: N/A;   FLEXIBLE SIGMOIDOSCOPY N/A 04/18/2016   Procedure: FLEXIBLE SIGMOIDOSCOPY;  Surgeon: Lamar CHRISTELLA Hollingshead, MD;  Location: [facility];  Service: Endoscopy;  Laterality: N/A;   LYSIS OF ADHESION N/A 01/29/2021   Procedure: LYSIS OF ADHESION;  Surgeon: Tanda Locus, MD;  Location: [facility];  Service: General;  Laterality: N/A;   TONSILLECTOMY      Family History  Problem Relation Age of Onset   Asthma Mother    Ulcers Mother    Bipolar disorder Mother    ADD / ADHD Father    Ulcerative colitis Maternal Aunt    Cancer Maternal Aunt        Leukemia   Ulcerative colitis Paternal Uncle    Colon polyps Maternal Grandmother    Diabetes Maternal Grandmother    Hypertension Maternal Grandmother    Sleep apnea Maternal Grandmother    Heart disease Maternal Grandmother    Diabetes Maternal Grandfather    Hypertension Maternal Grandfather    Sleep apnea Maternal Grandfather    Heart disease Maternal Grandfather    Cancer Maternal Grandfather        prostate   Colon cancer Neg Hx    Liver disease Neg Hx     Social History   Socioeconomic History   Marital status: Single    Spouse name: Not on file   Number of children: 1   Years of education: 12th   Highest education level: 12th grade  Occupational History    Employer: NOT EMPLOYED    Comment: disabled  Tobacco Use   Smoking status: Former    Current packs/day: 0.00    Types: Cigarettes    Quit date: 10/07/2010    Years since quitting: 13.4   Smokeless tobacco: Never   Tobacco comments:    Quit sometime after his second psychiatry appointment in early 2024  Vaping Use   Vaping status: Never Used  Substance and Sexual Activity   Alcohol use: Not Currently    Comment: 1 beer on special occasions    Drug use: Not Currently    Types: Marijuana    Comment: Stopped after initial psychiatry appointment.  Rolled blunt at night to help him sleep will also use when getting irritable.  Started week of 06/23/22   Sexual activity: Yes    Birth control/protection: None  Other Topics Concern   Not on file  Social History Narrative   Lives with mother   Jeannine and Wylie in the home.   Caffeine Use: occasionally   Diet: eats all food groups   Water : 6-8 cups daily       Likes to game late and sleep in until noon   [facility]      Does not drive  Wears seat belt    Smoke detectors    No weapons   Right handed   Social Drivers of Health   Financial Resource Strain: Low Risk  (02/22/2024)   Overall Financial Resource Strain (CARDIA)    Difficulty of Paying Living Expenses: Not very hard  Food Insecurity: Food Insecurity Present (02/22/2024)   Hunger Vital Sign    Worried About Running Out of Food in the Last Year: Often true    Ran Out of Food in the Last Year: Often true  Transportation Needs: No Transportation Needs (02/22/2024)   PRAPARE - Administrator, Civil Service (Medical): No    Lack of Transportation (Non-Medical): No  Physical Activity: Inactive (02/22/2024)   Exercise Vital Sign    Days of Exercise per Week: 0 days    Minutes of Exercise per Session: Not on file  Stress: Stress Concern Present (02/22/2024)   [employer] of Occupational Health - Occupational Stress Questionnaire    Feeling of Stress: Rather much  Social Connections: Moderately Isolated (02/22/2024)   Social Connection and Isolation Panel    Frequency of Communication with Friends and Family: More than three times a week    Frequency of Social Gatherings with Friends and Family: Three times a week    Attends Religious Services: More than 4 times per year    Active Member of Clubs or Organizations: No    Attends Banker Meetings: Not on file     Marital Status: Separated  Intimate Partner Violence: Not At Risk (06/12/2020)   Humiliation, Afraid, Rape, and Kick questionnaire    Fear of Current or Ex-Partner: No    Emotionally Abused: No    Physically Abused: No    Sexually Abused: No    Outpatient Medications Prior to Visit  Medication Sig Dispense Refill   albuterol  (PROVENTIL ) (2.5 MG/3ML) 0.083% nebulizer solution Inhale 3 mLs (2.5 mg total) into the lungs every 6 (six) hours as needed for wheezing or shortness of breath. 75 mL 3   albuterol  (VENTOLIN  HFA) 108 (90 Base) MCG/ACT inhaler Inhale 2 puffs into the lungs every 4 (four) hours as needed for wheezing or shortness of breath. 1 each 2   cholecalciferol  (VITAMIN D ) 1000 units tablet Take 1 tablet (1,000 Units total) by mouth daily. START AFTER VITAMIN D  50,000 U TABLETS ARE COMPLETE (Patient taking differently: Take 1,000 Units by mouth daily.) 30 tablet 11   EPINEPHrine  0.3 mg/0.3 mL IJ SOAJ injection Inject 0.3 mg into the muscle once as needed (for severe allergic reaction). 1 each 1   famotidine  (PEPCID ) 20 MG tablet Take 1 tablet (20 mg total) by mouth daily. 90 tablet 1   fluticasone  (FLONASE ) 50 MCG/ACT nasal spray Place 2 sprays into both nostrils daily. 16 g 6   pantoprazole  (PROTONIX ) 20 MG tablet TAKE 1 TABLET(20 MG) BY MOUTH DAILY 90 tablet 0   promethazine -dextromethorphan (PROMETHAZINE -DM) 6.25-15 MG/5ML syrup Take 5 mLs by mouth 4 (four) times daily as needed for cough. 118 mL 0   Respiratory Therapy Supplies (NEBULIZER COMPRESSOR) KIT And supplies 1 each 0   SUMAtriptan  (IMITREX ) 100 MG tablet Take 1 tablet earliest onset of headache.  May repeat in 2 hours if headache persists or recurs.  Maximum 2 tablets in 24 hours. 10 tablet 5   No facility-administered medications prior to visit.    Allergies  Allergen Reactions   Nsaids Other (See Comments)    Should not take due to GI condition   Other  Other (See Comments)    Should not take 2/2 GI condition Other  reaction(s): Other (See Comments) Should not take due to GI condition   Pineapple Swelling and Other (See Comments)    Reaction:  Lip swelling   Remicade  [Infliximab ] Other (See Comments)    AUTOIMMUNE HEPATITIS   Strawberry Extract Swelling    Reaction:  Lip swelling   Amoxicillin Nausea And Vomiting   Amoxicillin-Pot Clavulanate Nausea And Vomiting and Other (See Comments)    GI intolerance    Omeprazole Nausea And Vomiting   Tomato Rash    Review of Systems  Constitutional:  Negative for chills and fever.  HENT:  Positive for congestion, sinus pressure, sinus pain and sore throat.   Eyes:  Positive for pain, discharge and redness.  Respiratory:  Positive for cough. Negative for shortness of breath.   Cardiovascular:  Negative for chest pain and palpitations.  Gastrointestinal:  Positive for diarrhea. Negative for constipation, nausea and vomiting.  Endocrine: Negative for polydipsia and polyuria.  Genitourinary:  Negative for dysuria and hematuria.  Musculoskeletal:  Negative for neck pain and neck stiffness.  Skin:  Negative for rash.  Neurological:  Negative for dizziness, weakness and numbness.  Psychiatric/Behavioral:  Positive for agitation, decreased concentration and sleep disturbance. The patient is nervous/anxious.        Objective:    Physical Exam Vitals reviewed.  Constitutional:      General: He is not in acute distress.    Appearance: He is not diaphoretic.  HENT:     Head: Normocephalic and atraumatic.     Right Ear: There is no impacted cerumen.     Left Ear: There is no impacted cerumen.     Nose: Congestion present.     Mouth/Throat:     Mouth: Mucous membranes are moist.  Eyes:     General: No scleral icterus.    Extraocular Movements: Extraocular movements intact.     Conjunctiva/sclera:     Right eye: Right conjunctiva is injected.     Comments: Mild right upper eyelid edema  Cardiovascular:     Rate and Rhythm: Normal rate and regular  rhythm.     Heart sounds: Normal heart sounds. No murmur heard. Pulmonary:     Breath sounds: Normal breath sounds. No wheezing or rales.  Musculoskeletal:     Cervical back: Neck supple. No tenderness.     Right lower leg: No edema.     Left lower leg: No edema.  Skin:    General: Skin is warm.     Findings: No rash.  Neurological:     General: No focal deficit present.     Mental Status: He is alert and oriented to person, place, and time.     Sensory: No sensory deficit.     Motor: No weakness.  Psychiatric:        Mood and Affect: Mood is anxious.        Behavior: Behavior is agitated.        Thought Content: Thought content does not include homicidal or suicidal ideation.     BP 108/72   Pulse 76   Ht 5' 8 (1.727 m)   Wt 226 lb 6.4 oz (102.7 kg)   SpO2 97%   BMI 34.42 kg/m  Wt Readings from Last 3 Encounters:  03/01/24 226 lb 6.4 oz (102.7 kg)  02/22/24 228 lb 9.6 oz (103.7 kg)  02/03/24 231 lb 6.4 oz (105 kg)  Assessment & Plan:   Problem List Items Addressed This Visit       Other   Right eye injury - Primary   Had right eye injury due to pressure washer tip Needs urgent ophthalmology evaluation, sent referral to [facility] Advised to complete course of oral Keflex  and continue using moxifloxacin  eyedrops for now for bacterial Ppx Can use artificial tears for symptomatic relief      Relevant Orders   Ambulatory referral to Ophthalmology   Encounter for examination following treatment at hospital   Recent ER visit for right eye injury, chart reviewed Advised to continue oral Keflex  and moxifloxacin  eyedrop for now        No orders of the defined types were placed in this encounter.    Suzzane MARLA Blanch, MD

## 2024-03-01 NOTE — Assessment & Plan Note (Signed)
 Had right eye injury due to pressure washer tip Needs urgent ophthalmology evaluation, sent referral to [facility] Advised to complete course of oral Keflex  and continue using moxifloxacin  eyedrops for now for bacterial Ppx Can use artificial tears for symptomatic relief

## 2024-03-16 ENCOUNTER — Other Ambulatory Visit: Payer: MEDICAID

## 2024-03-29 ENCOUNTER — Ambulatory Visit: Payer: MEDICAID | Admitting: Neurology

## 2024-07-14 ENCOUNTER — Other Ambulatory Visit: Payer: Self-pay

## 2024-07-26 ENCOUNTER — Other Ambulatory Visit: Payer: MEDICAID

## 2024-08-02 ENCOUNTER — Ambulatory Visit: Payer: MEDICAID | Admitting: Neurology

## 2024-08-03 ENCOUNTER — Ambulatory Visit: Payer: MEDICAID | Admitting: Internal Medicine
